# Patient Record
Sex: Male | Born: 1940
Health system: Southern US, Community
[De-identification: ages and names within clinical notes are randomized; demographics above are authoritative.]

## PROBLEM LIST (undated history)

## (undated) DIAGNOSIS — I340 Nonrheumatic mitral (valve) insufficiency: Secondary | ICD-10-CM

## (undated) DIAGNOSIS — I2510 Atherosclerotic heart disease of native coronary artery without angina pectoris: Secondary | ICD-10-CM

## (undated) DIAGNOSIS — K08109 Complete loss of teeth, unspecified cause, unspecified class: Secondary | ICD-10-CM

## (undated) DIAGNOSIS — K219 Gastro-esophageal reflux disease without esophagitis: Secondary | ICD-10-CM

## (undated) DIAGNOSIS — Z8719 Personal history of other diseases of the digestive system: Secondary | ICD-10-CM

## (undated) DIAGNOSIS — Z87898 Personal history of other specified conditions: Secondary | ICD-10-CM

## (undated) DIAGNOSIS — N529 Male erectile dysfunction, unspecified: Secondary | ICD-10-CM

## (undated) DIAGNOSIS — E782 Mixed hyperlipidemia: Secondary | ICD-10-CM

## (undated) DIAGNOSIS — M1990 Unspecified osteoarthritis, unspecified site: Secondary | ICD-10-CM

## (undated) DIAGNOSIS — N179 Acute kidney failure, unspecified: Secondary | ICD-10-CM

## (undated) DIAGNOSIS — C61 Malignant neoplasm of prostate: Secondary | ICD-10-CM

## (undated) DIAGNOSIS — I739 Peripheral vascular disease, unspecified: Secondary | ICD-10-CM

## (undated) DIAGNOSIS — N138 Other obstructive and reflux uropathy: Secondary | ICD-10-CM

## (undated) DIAGNOSIS — I10 Essential (primary) hypertension: Secondary | ICD-10-CM

## (undated) DIAGNOSIS — F329 Major depressive disorder, single episode, unspecified: Secondary | ICD-10-CM

## (undated) DIAGNOSIS — E119 Type 2 diabetes mellitus without complications: Secondary | ICD-10-CM

## (undated) DIAGNOSIS — R0600 Dyspnea, unspecified: Secondary | ICD-10-CM

## (undated) DIAGNOSIS — J449 Chronic obstructive pulmonary disease, unspecified: Secondary | ICD-10-CM

## (undated) DIAGNOSIS — N401 Enlarged prostate with lower urinary tract symptoms: Secondary | ICD-10-CM

## (undated) DIAGNOSIS — R569 Unspecified convulsions: Secondary | ICD-10-CM

## (undated) DIAGNOSIS — Z972 Presence of dental prosthetic device (complete) (partial): Secondary | ICD-10-CM

## (undated) DIAGNOSIS — F32A Depression, unspecified: Secondary | ICD-10-CM

## (undated) DIAGNOSIS — Z973 Presence of spectacles and contact lenses: Secondary | ICD-10-CM

## (undated) DIAGNOSIS — K5730 Diverticulosis of large intestine without perforation or abscess without bleeding: Secondary | ICD-10-CM

## (undated) DIAGNOSIS — Z8619 Personal history of other infectious and parasitic diseases: Secondary | ICD-10-CM

## (undated) HISTORY — DX: Gastro-esophageal reflux disease without esophagitis: K21.9

## (undated) HISTORY — PX: ESOPHAGOGASTRODUODENOSCOPY: SHX1529

## (undated) HISTORY — DX: Major depressive disorder, single episode, unspecified: F32.9

## (undated) HISTORY — PX: FOOT SURGERY: SHX648

## (undated) HISTORY — DX: Depression, unspecified: F32.A

## (undated) HISTORY — DX: Peripheral vascular disease, unspecified: I73.9

## (undated) HISTORY — PX: COLONOSCOPY: SHX174

## (undated) HISTORY — PX: TONSILLECTOMY: SUR1361

## (undated) HISTORY — DX: Nonrheumatic mitral (valve) insufficiency: I34.0

## (undated) SURGERY — MINOR CAPSULOTOMY
Anesthesia: Choice | Laterality: Right

---

## 1998-06-18 ENCOUNTER — Emergency Department (HOSPITAL_COMMUNITY): Admission: EM | Admit: 1998-06-18 | Discharge: 1998-06-18 | Payer: Self-pay | Admitting: Emergency Medicine

## 2000-07-05 ENCOUNTER — Inpatient Hospital Stay (HOSPITAL_COMMUNITY): Admission: EM | Admit: 2000-07-05 | Discharge: 2000-07-06 | Payer: Self-pay | Admitting: *Deleted

## 2000-07-05 ENCOUNTER — Encounter: Payer: Self-pay | Admitting: Emergency Medicine

## 2000-07-05 HISTORY — PX: OTHER SURGICAL HISTORY: SHX169

## 2000-07-28 ENCOUNTER — Encounter: Admission: RE | Admit: 2000-07-28 | Discharge: 2000-09-16 | Payer: Self-pay | Admitting: Orthopedic Surgery

## 2001-02-20 ENCOUNTER — Encounter: Payer: Self-pay | Admitting: Orthopedic Surgery

## 2001-02-20 ENCOUNTER — Ambulatory Visit (HOSPITAL_COMMUNITY): Admission: RE | Admit: 2001-02-20 | Discharge: 2001-02-20 | Payer: Self-pay | Admitting: Orthopedic Surgery

## 2001-05-11 ENCOUNTER — Encounter: Admission: RE | Admit: 2001-05-11 | Discharge: 2001-05-26 | Payer: Self-pay | Admitting: Orthopedic Surgery

## 2003-10-04 ENCOUNTER — Encounter: Payer: Self-pay | Admitting: Emergency Medicine

## 2003-10-04 ENCOUNTER — Emergency Department (HOSPITAL_COMMUNITY): Admission: EM | Admit: 2003-10-04 | Discharge: 2003-10-04 | Payer: Self-pay | Admitting: Emergency Medicine

## 2003-12-01 ENCOUNTER — Emergency Department (HOSPITAL_COMMUNITY): Admission: EM | Admit: 2003-12-01 | Discharge: 2003-12-01 | Payer: Self-pay | Admitting: Emergency Medicine

## 2004-02-16 ENCOUNTER — Emergency Department (HOSPITAL_COMMUNITY): Admission: EM | Admit: 2004-02-16 | Discharge: 2004-02-16 | Payer: Self-pay | Admitting: Emergency Medicine

## 2004-02-22 ENCOUNTER — Emergency Department (HOSPITAL_COMMUNITY): Admission: EM | Admit: 2004-02-22 | Discharge: 2004-02-22 | Payer: Self-pay | Admitting: Emergency Medicine

## 2004-09-16 ENCOUNTER — Emergency Department (HOSPITAL_COMMUNITY): Admission: EM | Admit: 2004-09-16 | Discharge: 2004-09-17 | Payer: Self-pay | Admitting: Emergency Medicine

## 2004-10-15 ENCOUNTER — Emergency Department (HOSPITAL_COMMUNITY): Admission: EM | Admit: 2004-10-15 | Discharge: 2004-10-15 | Payer: Self-pay | Admitting: Emergency Medicine

## 2005-03-05 ENCOUNTER — Emergency Department (HOSPITAL_COMMUNITY): Admission: EM | Admit: 2005-03-05 | Discharge: 2005-03-05 | Payer: Self-pay | Admitting: Emergency Medicine

## 2005-05-30 ENCOUNTER — Emergency Department (HOSPITAL_COMMUNITY): Admission: EM | Admit: 2005-05-30 | Discharge: 2005-05-30 | Payer: Self-pay | Admitting: Emergency Medicine

## 2006-02-11 ENCOUNTER — Emergency Department (HOSPITAL_COMMUNITY): Admission: EM | Admit: 2006-02-11 | Discharge: 2006-02-11 | Payer: Self-pay | Admitting: Emergency Medicine

## 2006-03-16 ENCOUNTER — Encounter (INDEPENDENT_AMBULATORY_CARE_PROVIDER_SITE_OTHER): Payer: Self-pay | Admitting: Internal Medicine

## 2006-03-16 ENCOUNTER — Ambulatory Visit: Payer: Self-pay | Admitting: Internal Medicine

## 2006-03-16 LAB — CONVERTED CEMR LAB: HCT: 42.3 %

## 2006-03-17 ENCOUNTER — Encounter (INDEPENDENT_AMBULATORY_CARE_PROVIDER_SITE_OTHER): Payer: Self-pay | Admitting: Internal Medicine

## 2006-03-31 ENCOUNTER — Ambulatory Visit: Payer: Self-pay | Admitting: Internal Medicine

## 2006-04-05 ENCOUNTER — Ambulatory Visit (HOSPITAL_COMMUNITY): Admission: RE | Admit: 2006-04-05 | Discharge: 2006-04-05 | Payer: Self-pay | Admitting: Internal Medicine

## 2006-04-07 ENCOUNTER — Ambulatory Visit: Payer: Self-pay | Admitting: Internal Medicine

## 2006-05-05 ENCOUNTER — Ambulatory Visit: Payer: Self-pay | Admitting: Internal Medicine

## 2006-05-06 ENCOUNTER — Ambulatory Visit (HOSPITAL_COMMUNITY): Admission: RE | Admit: 2006-05-06 | Discharge: 2006-05-06 | Payer: Self-pay | Admitting: Internal Medicine

## 2006-05-10 ENCOUNTER — Ambulatory Visit: Payer: Self-pay | Admitting: Internal Medicine

## 2006-05-10 DIAGNOSIS — R634 Abnormal weight loss: Secondary | ICD-10-CM

## 2006-10-29 ENCOUNTER — Emergency Department (HOSPITAL_COMMUNITY): Admission: EM | Admit: 2006-10-29 | Discharge: 2006-10-30 | Payer: Self-pay | Admitting: Emergency Medicine

## 2006-11-08 ENCOUNTER — Ambulatory Visit: Payer: Self-pay | Admitting: Internal Medicine

## 2006-12-21 HISTORY — PX: KNEE ARTHROSCOPY: SUR90

## 2007-02-27 ENCOUNTER — Emergency Department (HOSPITAL_COMMUNITY): Admission: EM | Admit: 2007-02-27 | Discharge: 2007-02-27 | Payer: Self-pay | Admitting: Emergency Medicine

## 2007-02-28 ENCOUNTER — Emergency Department (HOSPITAL_COMMUNITY): Admission: EM | Admit: 2007-02-28 | Discharge: 2007-03-01 | Payer: Self-pay | Admitting: Emergency Medicine

## 2007-03-17 ENCOUNTER — Ambulatory Visit (HOSPITAL_COMMUNITY): Admission: RE | Admit: 2007-03-17 | Discharge: 2007-03-17 | Payer: Self-pay | Admitting: Internal Medicine

## 2007-03-17 ENCOUNTER — Ambulatory Visit: Payer: Self-pay | Admitting: Internal Medicine

## 2007-03-31 ENCOUNTER — Ambulatory Visit: Payer: Self-pay | Admitting: Internal Medicine

## 2007-04-14 ENCOUNTER — Encounter: Admission: RE | Admit: 2007-04-14 | Discharge: 2007-06-01 | Payer: Self-pay | Admitting: Internal Medicine

## 2007-05-20 ENCOUNTER — Emergency Department (HOSPITAL_COMMUNITY): Admission: EM | Admit: 2007-05-20 | Discharge: 2007-05-20 | Payer: Self-pay | Admitting: Emergency Medicine

## 2007-05-31 ENCOUNTER — Ambulatory Visit: Payer: Self-pay | Admitting: Internal Medicine

## 2007-05-31 DIAGNOSIS — D500 Iron deficiency anemia secondary to blood loss (chronic): Secondary | ICD-10-CM | POA: Insufficient documentation

## 2007-05-31 DIAGNOSIS — D509 Iron deficiency anemia, unspecified: Secondary | ICD-10-CM | POA: Insufficient documentation

## 2007-05-31 LAB — CONVERTED CEMR LAB
BUN: 25 mg/dL
Creatinine, Ser: 1.13 mg/dL
HCT: 40.4 %
Platelets: 361 10*3/uL

## 2007-06-30 ENCOUNTER — Ambulatory Visit: Payer: Self-pay | Admitting: Internal Medicine

## 2007-07-05 DIAGNOSIS — K921 Melena: Secondary | ICD-10-CM | POA: Insufficient documentation

## 2007-08-15 ENCOUNTER — Telehealth (INDEPENDENT_AMBULATORY_CARE_PROVIDER_SITE_OTHER): Payer: Self-pay | Admitting: Internal Medicine

## 2007-08-17 ENCOUNTER — Encounter (INDEPENDENT_AMBULATORY_CARE_PROVIDER_SITE_OTHER): Payer: Self-pay | Admitting: Internal Medicine

## 2007-08-17 DIAGNOSIS — I10 Essential (primary) hypertension: Secondary | ICD-10-CM | POA: Insufficient documentation

## 2007-08-18 DIAGNOSIS — F528 Other sexual dysfunction not due to a substance or known physiological condition: Secondary | ICD-10-CM

## 2007-08-18 DIAGNOSIS — F1010 Alcohol abuse, uncomplicated: Secondary | ICD-10-CM | POA: Insufficient documentation

## 2007-08-23 ENCOUNTER — Telehealth (INDEPENDENT_AMBULATORY_CARE_PROVIDER_SITE_OTHER): Payer: Self-pay | Admitting: Internal Medicine

## 2007-08-26 ENCOUNTER — Ambulatory Visit: Payer: Self-pay | Admitting: Internal Medicine

## 2007-08-26 DIAGNOSIS — B86 Scabies: Secondary | ICD-10-CM

## 2007-08-31 ENCOUNTER — Encounter (INDEPENDENT_AMBULATORY_CARE_PROVIDER_SITE_OTHER): Payer: Self-pay | Admitting: Internal Medicine

## 2007-08-31 DIAGNOSIS — K5730 Diverticulosis of large intestine without perforation or abscess without bleeding: Secondary | ICD-10-CM | POA: Insufficient documentation

## 2007-08-31 HISTORY — PX: COLONOSCOPY: SHX174

## 2007-09-07 ENCOUNTER — Telehealth (INDEPENDENT_AMBULATORY_CARE_PROVIDER_SITE_OTHER): Payer: Self-pay | Admitting: *Deleted

## 2007-09-13 ENCOUNTER — Encounter (INDEPENDENT_AMBULATORY_CARE_PROVIDER_SITE_OTHER): Payer: Self-pay | Admitting: Internal Medicine

## 2007-10-06 ENCOUNTER — Encounter (INDEPENDENT_AMBULATORY_CARE_PROVIDER_SITE_OTHER): Payer: Self-pay | Admitting: Internal Medicine

## 2007-10-06 ENCOUNTER — Telehealth (INDEPENDENT_AMBULATORY_CARE_PROVIDER_SITE_OTHER): Payer: Self-pay | Admitting: *Deleted

## 2007-10-18 ENCOUNTER — Encounter (INDEPENDENT_AMBULATORY_CARE_PROVIDER_SITE_OTHER): Payer: Self-pay | Admitting: Internal Medicine

## 2007-11-04 ENCOUNTER — Ambulatory Visit: Payer: Self-pay | Admitting: Internal Medicine

## 2007-11-04 DIAGNOSIS — R198 Other specified symptoms and signs involving the digestive system and abdomen: Secondary | ICD-10-CM | POA: Insufficient documentation

## 2007-11-04 HISTORY — DX: Other specified symptoms and signs involving the digestive system and abdomen: R19.8

## 2007-12-12 ENCOUNTER — Encounter (INDEPENDENT_AMBULATORY_CARE_PROVIDER_SITE_OTHER): Payer: Self-pay | Admitting: Internal Medicine

## 2007-12-22 HISTORY — PX: CATARACT EXTRACTION W/ INTRAOCULAR LENS  IMPLANT, BILATERAL: SHX1307

## 2007-12-24 ENCOUNTER — Encounter: Admission: RE | Admit: 2007-12-24 | Discharge: 2007-12-24 | Payer: Self-pay | Admitting: Neurology

## 2008-01-12 ENCOUNTER — Ambulatory Visit: Payer: Self-pay | Admitting: Internal Medicine

## 2008-01-12 DIAGNOSIS — M4802 Spinal stenosis, cervical region: Secondary | ICD-10-CM

## 2008-01-12 DIAGNOSIS — R252 Cramp and spasm: Secondary | ICD-10-CM | POA: Insufficient documentation

## 2008-01-13 ENCOUNTER — Telehealth (INDEPENDENT_AMBULATORY_CARE_PROVIDER_SITE_OTHER): Payer: Self-pay | Admitting: Internal Medicine

## 2008-01-26 ENCOUNTER — Telehealth (INDEPENDENT_AMBULATORY_CARE_PROVIDER_SITE_OTHER): Payer: Self-pay | Admitting: Internal Medicine

## 2008-02-10 ENCOUNTER — Telehealth (INDEPENDENT_AMBULATORY_CARE_PROVIDER_SITE_OTHER): Payer: Self-pay | Admitting: Internal Medicine

## 2008-02-14 ENCOUNTER — Encounter (INDEPENDENT_AMBULATORY_CARE_PROVIDER_SITE_OTHER): Payer: Self-pay | Admitting: Internal Medicine

## 2008-02-25 ENCOUNTER — Telehealth (INDEPENDENT_AMBULATORY_CARE_PROVIDER_SITE_OTHER): Payer: Self-pay | Admitting: Internal Medicine

## 2008-02-27 ENCOUNTER — Encounter (INDEPENDENT_AMBULATORY_CARE_PROVIDER_SITE_OTHER): Payer: Self-pay | Admitting: Internal Medicine

## 2008-03-01 ENCOUNTER — Ambulatory Visit: Payer: Self-pay | Admitting: Internal Medicine

## 2008-03-01 DIAGNOSIS — H269 Unspecified cataract: Secondary | ICD-10-CM | POA: Insufficient documentation

## 2008-03-07 ENCOUNTER — Ambulatory Visit (HOSPITAL_COMMUNITY): Admission: RE | Admit: 2008-03-07 | Discharge: 2008-03-07 | Payer: Self-pay | Admitting: Internal Medicine

## 2008-03-07 ENCOUNTER — Telehealth (INDEPENDENT_AMBULATORY_CARE_PROVIDER_SITE_OTHER): Payer: Self-pay | Admitting: Internal Medicine

## 2008-03-07 ENCOUNTER — Ambulatory Visit: Payer: Self-pay | Admitting: Internal Medicine

## 2008-03-10 ENCOUNTER — Encounter (INDEPENDENT_AMBULATORY_CARE_PROVIDER_SITE_OTHER): Payer: Self-pay | Admitting: Internal Medicine

## 2008-03-10 LAB — CONVERTED CEMR LAB
AST: 8 units/L (ref 0–37)
Alkaline Phosphatase: 62 units/L (ref 39–117)
BUN: 21 mg/dL (ref 6–23)
Basophils Relative: 1 % (ref 0–1)
Calcium: 9.5 mg/dL (ref 8.4–10.5)
Creatinine, Ser: 0.97 mg/dL (ref 0.40–1.50)
Eosinophils Absolute: 0.3 10*3/uL (ref 0.0–0.7)
HCT: 40.7 % (ref 39.0–52.0)
HDL: 41 mg/dL (ref >39)
Hemoglobin: 12.8 g/dL — ABNORMAL LOW (ref 13.0–17.0)
LDL Cholesterol: 174 mg/dL — ABNORMAL HIGH (ref 0–99)
MCHC: 31.4 g/dL (ref 30.0–36.0)
MCV: 85 fL (ref 78.0–100.0)
Monocytes Absolute: 0.6 10*3/uL (ref 0.1–1.0)
Monocytes Relative: 7 % (ref 3–12)
RBC: 4.79 M/uL (ref 4.22–5.81)
Total CHOL/HDL Ratio: 5.9
Triglycerides: 131 mg/dL (ref <150)

## 2008-03-22 ENCOUNTER — Ambulatory Visit (HOSPITAL_COMMUNITY): Admission: RE | Admit: 2008-03-22 | Discharge: 2008-03-22 | Payer: Self-pay | Admitting: Ophthalmology

## 2008-03-26 ENCOUNTER — Telehealth (INDEPENDENT_AMBULATORY_CARE_PROVIDER_SITE_OTHER): Payer: Self-pay | Admitting: Internal Medicine

## 2008-03-29 ENCOUNTER — Ambulatory Visit: Payer: Self-pay | Admitting: Internal Medicine

## 2008-04-20 ENCOUNTER — Telehealth (INDEPENDENT_AMBULATORY_CARE_PROVIDER_SITE_OTHER): Payer: Self-pay | Admitting: Internal Medicine

## 2008-05-10 ENCOUNTER — Ambulatory Visit: Payer: Self-pay | Admitting: Internal Medicine

## 2008-05-11 ENCOUNTER — Telehealth (INDEPENDENT_AMBULATORY_CARE_PROVIDER_SITE_OTHER): Payer: Self-pay | Admitting: Internal Medicine

## 2008-05-11 ENCOUNTER — Encounter (INDEPENDENT_AMBULATORY_CARE_PROVIDER_SITE_OTHER): Payer: Self-pay | Admitting: Internal Medicine

## 2008-05-11 DIAGNOSIS — E7800 Pure hypercholesterolemia, unspecified: Secondary | ICD-10-CM | POA: Insufficient documentation

## 2008-05-11 LAB — CONVERTED CEMR LAB
ALT: 12 units/L (ref 0–53)
Bilirubin, Direct: 0.1 mg/dL (ref 0.0–0.3)
Cholesterol: 156 mg/dL (ref 0–200)
Indirect Bilirubin: 0.3 mg/dL (ref 0.0–0.9)
Total Bilirubin: 0.4 mg/dL (ref 0.3–1.2)
VLDL: 12 mg/dL (ref 0–40)

## 2008-05-31 ENCOUNTER — Encounter (INDEPENDENT_AMBULATORY_CARE_PROVIDER_SITE_OTHER): Payer: Self-pay | Admitting: Internal Medicine

## 2008-05-31 ENCOUNTER — Ambulatory Visit (HOSPITAL_COMMUNITY): Admission: RE | Admit: 2008-05-31 | Discharge: 2008-05-31 | Payer: Self-pay | Admitting: Ophthalmology

## 2008-06-18 ENCOUNTER — Emergency Department (HOSPITAL_COMMUNITY): Admission: EM | Admit: 2008-06-18 | Discharge: 2008-06-18 | Payer: Self-pay | Admitting: Emergency Medicine

## 2008-06-21 ENCOUNTER — Emergency Department (HOSPITAL_COMMUNITY): Admission: EM | Admit: 2008-06-21 | Discharge: 2008-06-21 | Payer: Self-pay | Admitting: Family Medicine

## 2008-07-27 ENCOUNTER — Ambulatory Visit: Payer: Self-pay | Admitting: Internal Medicine

## 2008-07-27 DIAGNOSIS — R42 Dizziness and giddiness: Secondary | ICD-10-CM | POA: Insufficient documentation

## 2008-07-28 ENCOUNTER — Encounter (INDEPENDENT_AMBULATORY_CARE_PROVIDER_SITE_OTHER): Payer: Self-pay | Admitting: Internal Medicine

## 2008-07-28 LAB — CONVERTED CEMR LAB
ALT: 14 units/L (ref 0–53)
Alkaline Phosphatase: 69 units/L (ref 39–117)
Basophils Absolute: 0.1 10*3/uL (ref 0.0–0.1)
Basophils Relative: 1 % (ref 0–1)
Eosinophils Absolute: 0.4 10*3/uL (ref 0.0–0.7)
Eosinophils Relative: 4 % (ref 0–5)
HCT: 36.2 % — ABNORMAL LOW (ref 39.0–52.0)
MCV: 80.8 fL (ref 78.0–100.0)
Platelets: 494 10*3/uL — ABNORMAL HIGH (ref 150–400)
RDW: 17.2 % — ABNORMAL HIGH (ref 11.5–15.5)
Sodium: 141 meq/L (ref 135–145)
Total Bilirubin: 0.3 mg/dL (ref 0.3–1.2)
Total Protein: 7.4 g/dL (ref 6.0–8.3)

## 2008-07-30 ENCOUNTER — Emergency Department (HOSPITAL_COMMUNITY): Admission: EM | Admit: 2008-07-30 | Discharge: 2008-07-31 | Payer: Self-pay | Admitting: Emergency Medicine

## 2008-07-31 ENCOUNTER — Ambulatory Visit (HOSPITAL_COMMUNITY): Admission: RE | Admit: 2008-07-31 | Discharge: 2008-07-31 | Payer: Self-pay | Admitting: Internal Medicine

## 2008-08-01 ENCOUNTER — Encounter (INDEPENDENT_AMBULATORY_CARE_PROVIDER_SITE_OTHER): Payer: Self-pay | Admitting: Internal Medicine

## 2008-08-02 ENCOUNTER — Encounter (INDEPENDENT_AMBULATORY_CARE_PROVIDER_SITE_OTHER): Payer: Self-pay | Admitting: *Deleted

## 2008-08-07 ENCOUNTER — Encounter (INDEPENDENT_AMBULATORY_CARE_PROVIDER_SITE_OTHER): Payer: Self-pay | Admitting: Internal Medicine

## 2008-08-09 ENCOUNTER — Ambulatory Visit: Payer: Self-pay | Admitting: Internal Medicine

## 2008-08-09 LAB — CONVERTED CEMR LAB
Saturation Ratios: 12 % — ABNORMAL LOW (ref 20–55)
Vitamin B-12: 615 pg/mL (ref 211–911)

## 2008-09-10 ENCOUNTER — Ambulatory Visit: Payer: Self-pay | Admitting: Gastroenterology

## 2008-09-11 ENCOUNTER — Encounter: Payer: Self-pay | Admitting: Gastroenterology

## 2008-09-11 ENCOUNTER — Ambulatory Visit: Payer: Self-pay | Admitting: Gastroenterology

## 2008-09-14 ENCOUNTER — Encounter: Payer: Self-pay | Admitting: Gastroenterology

## 2008-09-17 ENCOUNTER — Ambulatory Visit (HOSPITAL_COMMUNITY): Admission: RE | Admit: 2008-09-17 | Discharge: 2008-09-17 | Payer: Self-pay | Admitting: Gastroenterology

## 2008-10-05 ENCOUNTER — Telehealth: Payer: Self-pay | Admitting: Gastroenterology

## 2008-10-10 ENCOUNTER — Ambulatory Visit: Payer: Self-pay | Admitting: Gastroenterology

## 2008-10-16 ENCOUNTER — Encounter (INDEPENDENT_AMBULATORY_CARE_PROVIDER_SITE_OTHER): Payer: Self-pay | Admitting: Internal Medicine

## 2008-11-07 ENCOUNTER — Telehealth (INDEPENDENT_AMBULATORY_CARE_PROVIDER_SITE_OTHER): Payer: Self-pay | Admitting: Internal Medicine

## 2008-12-18 ENCOUNTER — Ambulatory Visit: Payer: Self-pay | Admitting: Internal Medicine

## 2009-01-13 ENCOUNTER — Encounter (INDEPENDENT_AMBULATORY_CARE_PROVIDER_SITE_OTHER): Payer: Self-pay | Admitting: Internal Medicine

## 2009-01-13 LAB — CONVERTED CEMR LAB
Alkaline Phosphatase: 56 units/L (ref 39–117)
Bilirubin, Direct: 0.1 mg/dL (ref 0.0–0.3)
Cholesterol: 187 mg/dL (ref 0–200)
Indirect Bilirubin: 0.3 mg/dL (ref 0.0–0.9)
LDL Cholesterol: 102 mg/dL — ABNORMAL HIGH (ref 0–99)
Total Protein: 7.2 g/dL (ref 6.0–8.3)
Triglycerides: 135 mg/dL (ref <150)

## 2009-01-15 ENCOUNTER — Telehealth (INDEPENDENT_AMBULATORY_CARE_PROVIDER_SITE_OTHER): Payer: Self-pay | Admitting: Internal Medicine

## 2009-01-28 ENCOUNTER — Emergency Department (HOSPITAL_COMMUNITY): Admission: EM | Admit: 2009-01-28 | Discharge: 2009-01-29 | Payer: Self-pay | Admitting: Emergency Medicine

## 2009-03-08 ENCOUNTER — Encounter (INDEPENDENT_AMBULATORY_CARE_PROVIDER_SITE_OTHER): Payer: Self-pay | Admitting: Internal Medicine

## 2009-04-02 ENCOUNTER — Ambulatory Visit: Payer: Self-pay | Admitting: Internal Medicine

## 2009-04-02 DIAGNOSIS — R05 Cough: Secondary | ICD-10-CM | POA: Insufficient documentation

## 2009-04-17 ENCOUNTER — Inpatient Hospital Stay (HOSPITAL_COMMUNITY): Admission: EM | Admit: 2009-04-17 | Discharge: 2009-04-19 | Payer: Self-pay | Admitting: Emergency Medicine

## 2009-04-17 ENCOUNTER — Ambulatory Visit: Payer: Self-pay | Admitting: Family Medicine

## 2009-05-06 ENCOUNTER — Encounter: Admission: RE | Admit: 2009-05-06 | Discharge: 2009-05-06 | Payer: Self-pay | Admitting: Neurosurgery

## 2009-05-15 ENCOUNTER — Encounter (INDEPENDENT_AMBULATORY_CARE_PROVIDER_SITE_OTHER): Payer: Self-pay | Admitting: Internal Medicine

## 2009-05-16 ENCOUNTER — Ambulatory Visit: Payer: Self-pay | Admitting: Internal Medicine

## 2009-05-16 DIAGNOSIS — IMO0002 Reserved for concepts with insufficient information to code with codable children: Secondary | ICD-10-CM

## 2009-05-16 DIAGNOSIS — R1310 Dysphagia, unspecified: Secondary | ICD-10-CM

## 2009-05-16 HISTORY — DX: Dysphagia, unspecified: R13.10

## 2009-05-21 ENCOUNTER — Encounter (INDEPENDENT_AMBULATORY_CARE_PROVIDER_SITE_OTHER): Payer: Self-pay | Admitting: Internal Medicine

## 2009-05-22 ENCOUNTER — Encounter (INDEPENDENT_AMBULATORY_CARE_PROVIDER_SITE_OTHER): Payer: Self-pay | Admitting: Internal Medicine

## 2009-05-22 ENCOUNTER — Telehealth (INDEPENDENT_AMBULATORY_CARE_PROVIDER_SITE_OTHER): Payer: Self-pay | Admitting: Internal Medicine

## 2009-05-23 ENCOUNTER — Encounter: Admission: RE | Admit: 2009-05-23 | Discharge: 2009-05-23 | Payer: Self-pay | Admitting: Neurosurgery

## 2009-05-28 ENCOUNTER — Telehealth (INDEPENDENT_AMBULATORY_CARE_PROVIDER_SITE_OTHER): Payer: Self-pay | Admitting: Internal Medicine

## 2009-06-07 ENCOUNTER — Encounter (INDEPENDENT_AMBULATORY_CARE_PROVIDER_SITE_OTHER): Payer: Self-pay | Admitting: Internal Medicine

## 2009-06-13 ENCOUNTER — Encounter (INDEPENDENT_AMBULATORY_CARE_PROVIDER_SITE_OTHER): Payer: Self-pay | Admitting: Internal Medicine

## 2009-06-13 ENCOUNTER — Ambulatory Visit (HOSPITAL_COMMUNITY): Admission: RE | Admit: 2009-06-13 | Discharge: 2009-06-13 | Payer: Self-pay | Admitting: Internal Medicine

## 2009-06-26 ENCOUNTER — Encounter (INDEPENDENT_AMBULATORY_CARE_PROVIDER_SITE_OTHER): Payer: Self-pay | Admitting: Internal Medicine

## 2009-07-02 ENCOUNTER — Telehealth (INDEPENDENT_AMBULATORY_CARE_PROVIDER_SITE_OTHER): Payer: Self-pay | Admitting: Internal Medicine

## 2009-07-08 ENCOUNTER — Encounter (INDEPENDENT_AMBULATORY_CARE_PROVIDER_SITE_OTHER): Payer: Self-pay | Admitting: Internal Medicine

## 2009-07-09 ENCOUNTER — Ambulatory Visit (HOSPITAL_COMMUNITY): Admission: RE | Admit: 2009-07-09 | Discharge: 2009-07-09 | Payer: Self-pay | Admitting: Internal Medicine

## 2009-07-11 ENCOUNTER — Encounter (INDEPENDENT_AMBULATORY_CARE_PROVIDER_SITE_OTHER): Payer: Self-pay | Admitting: Internal Medicine

## 2009-07-20 ENCOUNTER — Encounter (INDEPENDENT_AMBULATORY_CARE_PROVIDER_SITE_OTHER): Payer: Self-pay | Admitting: Internal Medicine

## 2009-08-02 ENCOUNTER — Ambulatory Visit: Payer: Self-pay | Admitting: Internal Medicine

## 2009-08-03 ENCOUNTER — Encounter: Admission: RE | Admit: 2009-08-03 | Discharge: 2009-08-03 | Payer: Self-pay | Admitting: Neurosurgery

## 2009-08-04 ENCOUNTER — Emergency Department (HOSPITAL_COMMUNITY): Admission: EM | Admit: 2009-08-04 | Discharge: 2009-08-04 | Payer: Self-pay | Admitting: Emergency Medicine

## 2009-08-27 ENCOUNTER — Encounter: Admission: RE | Admit: 2009-08-27 | Discharge: 2009-08-27 | Payer: Self-pay | Admitting: Neurosurgery

## 2009-09-09 ENCOUNTER — Ambulatory Visit (HOSPITAL_COMMUNITY): Admission: RE | Admit: 2009-09-09 | Discharge: 2009-09-09 | Payer: Self-pay | Admitting: Neurosurgery

## 2009-10-02 ENCOUNTER — Encounter: Admission: RE | Admit: 2009-10-02 | Discharge: 2009-10-02 | Payer: Self-pay | Admitting: Neurosurgery

## 2009-10-16 ENCOUNTER — Inpatient Hospital Stay (HOSPITAL_COMMUNITY): Admission: RE | Admit: 2009-10-16 | Discharge: 2009-10-17 | Payer: Self-pay | Admitting: Neurosurgery

## 2009-10-16 HISTORY — PX: ANTERIOR CERVICAL DECOMP/DISCECTOMY FUSION: SHX1161

## 2009-11-07 ENCOUNTER — Ambulatory Visit: Payer: Self-pay | Admitting: Internal Medicine

## 2009-11-11 ENCOUNTER — Encounter: Admission: RE | Admit: 2009-11-11 | Discharge: 2009-11-11 | Payer: Self-pay | Admitting: Neurosurgery

## 2009-12-24 ENCOUNTER — Encounter: Admission: RE | Admit: 2009-12-24 | Discharge: 2009-12-24 | Payer: Self-pay | Admitting: Neurosurgery

## 2010-01-14 ENCOUNTER — Ambulatory Visit: Payer: Self-pay | Admitting: Internal Medicine

## 2010-01-14 DIAGNOSIS — E1165 Type 2 diabetes mellitus with hyperglycemia: Secondary | ICD-10-CM | POA: Insufficient documentation

## 2010-01-14 DIAGNOSIS — E119 Type 2 diabetes mellitus without complications: Secondary | ICD-10-CM

## 2010-01-20 LAB — CONVERTED CEMR LAB
Albumin: 4.8 g/dL (ref 3.5–5.2)
BUN: 20 mg/dL (ref 6–23)
CO2: 29 meq/L (ref 19–32)
Calcium: 9.9 mg/dL (ref 8.4–10.5)
Chloride: 100 meq/L (ref 96–112)
Cholesterol: 165 mg/dL (ref 0–200)
Creatinine, Ser: 1.16 mg/dL (ref 0.40–1.50)
Eosinophils Absolute: 0.1 10*3/uL (ref 0.0–0.7)
Eosinophils Relative: 2 % (ref 0–5)
Glucose, Bld: 93 mg/dL (ref 70–99)
HCT: 35.8 % — ABNORMAL LOW (ref 39.0–52.0)
HDL: 41 mg/dL (ref >39)
Hemoglobin: 10.9 g/dL — ABNORMAL LOW (ref 13.0–17.0)
Lymphs Abs: 3.2 10*3/uL (ref 0.7–4.0)
MCV: 78.3 fL (ref 78.0–100.0)
Monocytes Absolute: 0.5 10*3/uL (ref 0.1–1.0)
Monocytes Relative: 8 % (ref 3–12)
Platelets: 351 10*3/uL (ref 150–400)
Potassium: 4.8 meq/L (ref 3.5–5.3)
Total CHOL/HDL Ratio: 4
Triglycerides: 124 mg/dL (ref <150)
WBC: 7 10*3/uL (ref 4.0–10.5)

## 2010-01-21 ENCOUNTER — Encounter (INDEPENDENT_AMBULATORY_CARE_PROVIDER_SITE_OTHER): Payer: Self-pay | Admitting: Internal Medicine

## 2010-02-06 ENCOUNTER — Encounter (INDEPENDENT_AMBULATORY_CARE_PROVIDER_SITE_OTHER): Payer: Self-pay | Admitting: Internal Medicine

## 2010-02-18 ENCOUNTER — Encounter (INDEPENDENT_AMBULATORY_CARE_PROVIDER_SITE_OTHER): Payer: Self-pay | Admitting: Internal Medicine

## 2010-02-19 ENCOUNTER — Inpatient Hospital Stay (HOSPITAL_COMMUNITY): Admission: EM | Admit: 2010-02-19 | Discharge: 2010-02-22 | Payer: Self-pay | Admitting: Emergency Medicine

## 2010-02-19 ENCOUNTER — Ambulatory Visit: Payer: Self-pay | Admitting: Cardiovascular Disease

## 2010-02-19 DIAGNOSIS — J189 Pneumonia, unspecified organism: Secondary | ICD-10-CM | POA: Insufficient documentation

## 2010-02-21 ENCOUNTER — Ambulatory Visit: Payer: Self-pay | Admitting: Vascular Surgery

## 2010-02-21 ENCOUNTER — Encounter (INDEPENDENT_AMBULATORY_CARE_PROVIDER_SITE_OTHER): Payer: Self-pay | Admitting: Internal Medicine

## 2010-02-21 HISTORY — PX: TRANSTHORACIC ECHOCARDIOGRAM: SHX275

## 2010-02-27 ENCOUNTER — Ambulatory Visit: Payer: Self-pay | Admitting: Internal Medicine

## 2010-03-04 ENCOUNTER — Ambulatory Visit: Payer: Self-pay | Admitting: Internal Medicine

## 2010-03-06 ENCOUNTER — Encounter (INDEPENDENT_AMBULATORY_CARE_PROVIDER_SITE_OTHER): Payer: Self-pay | Admitting: Internal Medicine

## 2010-04-03 ENCOUNTER — Ambulatory Visit: Payer: Self-pay | Admitting: Internal Medicine

## 2010-04-03 DIAGNOSIS — F17200 Nicotine dependence, unspecified, uncomplicated: Secondary | ICD-10-CM | POA: Insufficient documentation

## 2010-04-03 DIAGNOSIS — J449 Chronic obstructive pulmonary disease, unspecified: Secondary | ICD-10-CM

## 2010-04-03 DIAGNOSIS — K219 Gastro-esophageal reflux disease without esophagitis: Secondary | ICD-10-CM | POA: Insufficient documentation

## 2010-04-03 DIAGNOSIS — J4489 Other specified chronic obstructive pulmonary disease: Secondary | ICD-10-CM | POA: Insufficient documentation

## 2010-04-03 DIAGNOSIS — R1319 Other dysphagia: Secondary | ICD-10-CM

## 2010-04-29 ENCOUNTER — Ambulatory Visit: Payer: Self-pay | Admitting: Internal Medicine

## 2010-04-29 LAB — CONVERTED CEMR LAB
ALT: 17 units/L (ref 0–53)
AST: 14 units/L (ref 0–37)
Albumin: 4.6 g/dL (ref 3.5–5.2)
Basophils Relative: 1 % (ref 0–1)
Bilirubin, Direct: 0.1 mg/dL (ref 0.0–0.3)
Cholesterol: 142 mg/dL (ref 0–200)
Eosinophils Absolute: 0.2 10*3/uL (ref 0.0–0.7)
HDL: 50 mg/dL (ref >39)
Hemoglobin: 11.2 g/dL — ABNORMAL LOW (ref 13.0–17.0)
Lymphs Abs: 2 10*3/uL (ref 0.7–4.0)
MCHC: 30.8 g/dL (ref 30.0–36.0)
MCV: 80.7 fL (ref 78.0–100.0)
Monocytes Absolute: 0.6 10*3/uL (ref 0.1–1.0)
Monocytes Relative: 8 % (ref 3–12)
Neutrophils Relative %: 59 % (ref 43–77)
RBC: 4.51 M/uL (ref 4.22–5.81)
Total Bilirubin: 0.3 mg/dL (ref 0.3–1.2)
Total CHOL/HDL Ratio: 2.8
VLDL: 18 mg/dL (ref 0–40)
WBC: 7 10*3/uL (ref 4.0–10.5)

## 2010-05-06 ENCOUNTER — Ambulatory Visit: Payer: Self-pay | Admitting: Internal Medicine

## 2010-05-07 ENCOUNTER — Telehealth (INDEPENDENT_AMBULATORY_CARE_PROVIDER_SITE_OTHER): Payer: Self-pay | Admitting: *Deleted

## 2010-05-08 ENCOUNTER — Encounter: Payer: Self-pay | Admitting: Internal Medicine

## 2010-05-15 ENCOUNTER — Telehealth (INDEPENDENT_AMBULATORY_CARE_PROVIDER_SITE_OTHER): Payer: Self-pay | Admitting: Internal Medicine

## 2010-05-15 ENCOUNTER — Ambulatory Visit: Payer: Self-pay | Admitting: Internal Medicine

## 2010-05-15 ENCOUNTER — Encounter: Payer: Self-pay | Admitting: Cardiovascular Disease

## 2010-05-15 DIAGNOSIS — R0989 Other specified symptoms and signs involving the circulatory and respiratory systems: Secondary | ICD-10-CM

## 2010-05-15 DIAGNOSIS — R0609 Other forms of dyspnea: Secondary | ICD-10-CM | POA: Insufficient documentation

## 2010-05-22 ENCOUNTER — Encounter: Payer: Self-pay | Admitting: Internal Medicine

## 2010-05-29 ENCOUNTER — Encounter: Payer: Self-pay | Admitting: Internal Medicine

## 2010-06-03 ENCOUNTER — Ambulatory Visit: Payer: Self-pay | Admitting: Cardiovascular Disease

## 2010-06-03 DIAGNOSIS — M79609 Pain in unspecified limb: Secondary | ICD-10-CM

## 2010-06-03 HISTORY — DX: Pain in unspecified limb: M79.609

## 2010-06-04 ENCOUNTER — Emergency Department (HOSPITAL_COMMUNITY): Admission: EM | Admit: 2010-06-04 | Discharge: 2010-06-04 | Payer: Self-pay | Admitting: Emergency Medicine

## 2010-06-13 ENCOUNTER — Ambulatory Visit
Admission: RE | Admit: 2010-06-13 | Discharge: 2010-06-13 | Payer: Self-pay | Source: Home / Self Care | Admitting: Otolaryngology

## 2010-06-13 ENCOUNTER — Emergency Department (HOSPITAL_COMMUNITY): Admission: EM | Admit: 2010-06-13 | Discharge: 2010-06-13 | Payer: Self-pay | Admitting: Emergency Medicine

## 2010-06-13 DIAGNOSIS — N401 Enlarged prostate with lower urinary tract symptoms: Secondary | ICD-10-CM

## 2010-06-13 HISTORY — PX: LARYNGOSCOPY AND ESOPHAGOSCOPY: SHX5660

## 2010-06-18 ENCOUNTER — Emergency Department (HOSPITAL_COMMUNITY): Admission: EM | Admit: 2010-06-18 | Discharge: 2010-06-19 | Payer: Self-pay | Admitting: Emergency Medicine

## 2010-06-20 ENCOUNTER — Encounter (INDEPENDENT_AMBULATORY_CARE_PROVIDER_SITE_OTHER): Payer: Self-pay | Admitting: Internal Medicine

## 2010-06-25 ENCOUNTER — Telehealth: Payer: Self-pay | Admitting: Internal Medicine

## 2010-07-01 ENCOUNTER — Emergency Department (HOSPITAL_COMMUNITY): Admission: EM | Admit: 2010-07-01 | Discharge: 2010-07-01 | Payer: Self-pay | Admitting: Emergency Medicine

## 2010-07-03 ENCOUNTER — Ambulatory Visit: Payer: Self-pay | Admitting: Internal Medicine

## 2010-07-03 DIAGNOSIS — E860 Dehydration: Secondary | ICD-10-CM | POA: Insufficient documentation

## 2010-07-03 DIAGNOSIS — R112 Nausea with vomiting, unspecified: Secondary | ICD-10-CM | POA: Insufficient documentation

## 2010-07-04 ENCOUNTER — Inpatient Hospital Stay (HOSPITAL_COMMUNITY): Admission: EM | Admit: 2010-07-04 | Discharge: 2010-07-05 | Payer: Self-pay | Admitting: Emergency Medicine

## 2010-07-15 ENCOUNTER — Ambulatory Visit: Payer: Self-pay | Admitting: Internal Medicine

## 2010-07-17 ENCOUNTER — Telehealth: Payer: Self-pay | Admitting: Internal Medicine

## 2010-07-18 ENCOUNTER — Telehealth (INDEPENDENT_AMBULATORY_CARE_PROVIDER_SITE_OTHER): Payer: Self-pay | Admitting: Internal Medicine

## 2010-08-28 ENCOUNTER — Encounter: Payer: Self-pay | Admitting: Internal Medicine

## 2010-09-09 ENCOUNTER — Ambulatory Visit: Payer: Self-pay | Admitting: Internal Medicine

## 2010-09-10 ENCOUNTER — Encounter (INDEPENDENT_AMBULATORY_CARE_PROVIDER_SITE_OTHER): Payer: Self-pay | Admitting: Internal Medicine

## 2010-09-10 LAB — CONVERTED CEMR LAB
AST: 11 units/L (ref 0–37)
Alkaline Phosphatase: 65 units/L (ref 39–117)
Bilirubin, Direct: 0.1 mg/dL (ref 0.0–0.3)
Indirect Bilirubin: 0.3 mg/dL (ref 0.0–0.9)
LDL Cholesterol: 79 mg/dL (ref 0–99)
Total Bilirubin: 0.4 mg/dL (ref 0.3–1.2)

## 2010-10-17 ENCOUNTER — Encounter (INDEPENDENT_AMBULATORY_CARE_PROVIDER_SITE_OTHER): Payer: Self-pay | Admitting: Internal Medicine

## 2010-10-31 ENCOUNTER — Encounter (INDEPENDENT_AMBULATORY_CARE_PROVIDER_SITE_OTHER): Payer: Self-pay | Admitting: Internal Medicine

## 2010-11-06 ENCOUNTER — Encounter (INDEPENDENT_AMBULATORY_CARE_PROVIDER_SITE_OTHER): Payer: Self-pay | Admitting: Internal Medicine

## 2010-11-19 ENCOUNTER — Telehealth (INDEPENDENT_AMBULATORY_CARE_PROVIDER_SITE_OTHER): Payer: Self-pay | Admitting: Internal Medicine

## 2010-11-23 ENCOUNTER — Inpatient Hospital Stay (HOSPITAL_COMMUNITY)
Admission: EM | Admit: 2010-11-23 | Discharge: 2010-11-26 | Payer: Self-pay | Source: Home / Self Care | Attending: Internal Medicine | Admitting: Internal Medicine

## 2010-12-22 ENCOUNTER — Encounter (INDEPENDENT_AMBULATORY_CARE_PROVIDER_SITE_OTHER): Payer: Self-pay | Admitting: Internal Medicine

## 2011-01-02 ENCOUNTER — Telehealth (INDEPENDENT_AMBULATORY_CARE_PROVIDER_SITE_OTHER): Payer: Self-pay | Admitting: Internal Medicine

## 2011-01-06 ENCOUNTER — Ambulatory Visit
Admission: RE | Admit: 2011-01-06 | Discharge: 2011-01-06 | Payer: Self-pay | Source: Home / Self Care | Attending: Internal Medicine | Admitting: Internal Medicine

## 2011-01-06 ENCOUNTER — Encounter (INDEPENDENT_AMBULATORY_CARE_PROVIDER_SITE_OTHER): Payer: Self-pay | Admitting: Internal Medicine

## 2011-01-06 DIAGNOSIS — G40309 Generalized idiopathic epilepsy and epileptic syndromes, not intractable, without status epilepticus: Secondary | ICD-10-CM | POA: Insufficient documentation

## 2011-01-08 ENCOUNTER — Ambulatory Visit (HOSPITAL_COMMUNITY)
Admission: RE | Admit: 2011-01-08 | Discharge: 2011-01-08 | Payer: Self-pay | Source: Home / Self Care | Attending: Ophthalmology | Admitting: Ophthalmology

## 2011-01-08 HISTORY — PX: OTHER SURGICAL HISTORY: SHX169

## 2011-01-12 ENCOUNTER — Encounter: Payer: Self-pay | Admitting: Internal Medicine

## 2011-01-18 LAB — CONVERTED CEMR LAB
Blood Glucose, Fingerstick: 94
Hgb A1c MFr Bld: 5.6 %

## 2011-01-22 NOTE — Miscellaneous (Signed)
Summary: dulera changed to symbicort per MR  Clinical Lists Changes  Medications: Removed medication of DULERA 100-5 MCG/ACT AERO (MOMETASONE FURO-FORMOTEROL FUM) 2 puffs twice per day Added new medication of SYMBICORT 80-4.5 MCG/ACT AERO (BUDESONIDE-FORMOTEROL FUMARATE) 2 puffs twice daily - Signed Rx of SYMBICORT 80-4.5 MCG/ACT AERO (BUDESONIDE-FORMOTEROL FUMARATE) 2 puffs twice daily;  #1 x 3;  Signed;  Entered by: Carron Curie CMA;  Authorized by: Kalman Shan MD;  Method used: Electronically to [facility] [address] [facility] [place], [place], [place]  [address], Ph: [phone], Fax: [phone]    Prescriptions: SYMBICORT 80-4.5 MCG/ACT AERO (BUDESONIDE-FORMOTEROL FUMARATE) 2 puffs twice daily  #1 x 3   Entered by:   Carron Curie CMA   Authorized by:   Kalman Shan MD   Signed by:   Carron Curie CMA on 05/22/2010   Method used:   Electronically to        [facility] [address] (retail)       [address]       [place], [place]  [address]       Ph: [phone]       Fax: [phone]   RxID:   [id number]

## 2011-01-22 NOTE — Assessment & Plan Note (Signed)
Summary: SEIZURES/LR   Vital Signs:  Patient profile:   70 year old male Weight:      163.13 pounds O2 Sat:      97 % on Room air Temp:     97.9 degrees F oral Pulse rate:   64 / minute Pulse rhythm:   irregular Resp:     20 per minute BP sitting:   140 / 84  (left arm) Cuff size:   regular  Vitals Entered By: Hale Drone CMA (January 06, 2011 11:54 AM)  O2 Flow:  Room air  Serial Vital Signs/Assessments:                                PEF    PreRx  PostRx Time      O2 Sat  O2 Type     L/min  L/min  L/min   By 12:01 PM  97  %   Room air                          Hale Drone CMA  Comments: 12:01 PM PF: 470 - 470 - 500 By: Hale Drone CMA   CC: Hosp. f/u on seizures. Having pain on both arms and left toe. Left toe feels numb. Taking Oxcodone for pain on left arm. Needs refill on Oxycodone and Nexium. Dr. Vear Clock is to refill his Oxycodone.  Is Patient Diabetic? Yes Pain Assessment Patient in pain? yes     Location: arms Intensity: 9 Type: aching Onset of pain  Constant CBG Result 95 CBG Device ID A non fasting  Does patient need assistance? Functional Status Self care   Primary Care Provider:  Julieanne Manson MD - PMD, Dr. Anne Hahn - Neurology  CC:  [facility]. f/u on seizures. Having pain on both arms and left toe. Left toe feels numb. Taking Oxcodone for pain on left arm. Needs refill on Oxycodone and Nexium. Dr. Vear Clock is to refill his Oxycodone. Marland Kitchen  History of Present Illness: 1. Pt. with chronic neurogenic pain--cervical and lumbar, for which he was taking Carbamazepine.  Seizures with toxic level Carbamazepine--hospitalized 12/4-12/7/11 for this.  No seizure activity since and has not felt this way since.  Pt. states the night of seizure onset, he was bumping into this and having difficulty keeping his balance.  Pt. denies purposely or even accidentally taking extra medication.  States he has seen his new neurologist, Dr. Vear Clock, since hospitalization.  No  indication as to why his level was so high per pt.  Sees Dr. Vear Clock again tomorrow--currently evaluating left leg radicular symptoms.  2.  GERD:  out of Nexium--was filled by our office yesterday, but pt. states was told not filled yet.    3.  DM:  Getting 130-140 range with sugars.  Did get flu vaccine today.  Had eye exam with Dr. Mitzi Davenport this morning.  Sounds like needs a procedure done on the left, but pt. not clear if any new diabetic changes.  Has had laser surgery in both eyes. Last A1C in May was in mid 5% range.  4.  Hypertension:  has not taken any meds today yet.  Had normal BMET in the hospital recently.  Do not see an A1C  Current Medications (verified): 1)  Nexium 40 Mg  Pack (Esomeprazole Magnesium) .... Take 1 Tablet By Mouth Two Times A Day 2)  Triamcinolone Acetonide 0.1 %  Crea (Triamcinolone  Acetonide) .... Apply Two Times A Day To Affected Area As Needed 3)  Pravastatin Sodium 80 Mg Tabs (Pravastatin Sodium) .Marland Kitchen.. 1 Tab By Mouth Daily With Meal 4)  Lumigan 0.03 % Soln (Bimatoprost) .Marland Kitchen.. 1 Drop Ou At Bedtime.  Dr. Mitzi Davenport. 5)  Losartan Potassium 100 Mg Tabs (Losartan Potassium) .Marland Kitchen.. 1 Tab By Mouth Daily 6)  Oxybutynin Chloride 10 Mg Xr24h-Tab (Oxybutynin Chloride) .Marland Kitchen.. 1 Tab By Mouth At Bedtime--Dr. Anne Hahn 7)  Cymbalta 60 Mg Cpep (Duloxetine Hcl) .Marland Kitchen.. 1 Cap By Mouth Daily--Dr. Wynetta Emery 8)  Ferrous Sulfate 325 (65 Fe) Mg Tabs (Ferrous Sulfate) .Marland Kitchen.. 1 Tab By Mouth Two Times A Day With 500 Mg of Vitamin C Daily 9)  Vitamin C 500 Mg Chew (Ascorbic Acid) .Marland Kitchen.. 1 Chewtab By Mouth Two Times A Day With Iron Supplement 10)  Carbamazepine 200 Mg Tabs (Carbamazepine) .Marland Kitchen.. 1 Tab By Mouth Two Times A Day --Dr. Thana Farr Spine Radiculopathy 11)  Spiriva Handihaler 18 Mcg Caps (Tiotropium Bromide Monohydrate) .... Once Daily 12)  Symbicort 80-4.5 Mcg/act Aero (Budesonide-Formoterol Fumarate) .... 2 Puffs Twice Daily 13)  Symbicort 80-4.5 Mcg/act Aero (Budesonide-Formoterol Fumarate) ....  2 Puffs Two Times A Day 14)  Methadone Hcl 5 Mg Tabs (Methadone Hcl) .Marland Kitchen.. 1 Tab Po Every 6 Hours As Needed:pain Clinic--Dr. Pearletha Forge 15)  Losartan Potassium 100 Mg Tabs (Losartan Potassium) .... Take One Tablet By Mouth Once Daily. 16)  Tamsulosin Hcl 0.4 Mg Caps (Tamsulosin Hcl) .Marland Kitchen.. 1 Cap By Mouth Craig Staggers, Anp--[facility] 17)  Oxycodone-Acetaminophen 5-325 Mg Tabs (Oxycodone-Acetaminophen) .Marland Kitchen.. 1 Tab By Mouth Every 6 Hours As Needed For Pain. 18)  Amitriptyline Hcl 10 Mg Tabs (Amitriptyline Hcl) .Marland Kitchen.. 1 Tab Every Night At Bed Time  Allergies (verified): No Known Drug Allergies  Physical Exam  General:  NAD Lungs:  Normal respiratory effort, chest expands symmetrically. Lungs are clear to auscultation, no crackles or wheezes. Heart:  Normal rate and regular rhythm. S1 and S2 normal without gallop, murmur, click, rub or other extra sounds.  Radial pulses normal and equal Neurologic:  alert & oriented X3, cranial nerves II-XII intact, and gait normal.     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 Pt. was taking for control of chronic neurogenic pain. The following medications were removed from the medication list:    Carbamazepine 200 Mg Tabs (Carbamazepine) .Marland Kitchen... 1 tab by mouth two times a day --dr. Thana Farr spine radiculopathy  Problem # 2:  G E R D (ICD-530.81) Nexium refilled His updated medication list for this problem includes:    Nexium 40 Mg Pack (Esomeprazole magnesium) .Marland Kitchen... Take 1 tablet by mouth two times a day  Problem # 3:  DIABETES MELLITUS, TYPE II, CONTROLLED (ICD-250.00) Has been controlled The following medications were removed from the medication list:    Losartan Potassium 100 Mg Tabs (Losartan potassium) .Marland Kitchen... Take one tablet by mouth once daily. His updated medication list for this problem includes:    Losartan Potassium 100 Mg Tabs (Losartan potassium) .Marland Kitchen... 1  tab by mouth daily  Orders: Capillary Blood Glucose/CBG (82948) T- Hemoglobin A1C (78469-62952) T-Urine Microalbumin w/creat. ratio ([phone])  Problem # 4:  HYPERTENSION (ICD-401.9) BP up a bit today, but did not take Losartan this morning. The following medications were removed from the medication list:    Losartan Potassium 100 Mg Tabs (Losartan potassium) .Marland Kitchen... Take one tablet by mouth once daily. His updated medication list for this problem includes:  Losartan Potassium 100 Mg Tabs (Losartan potassium) .Marland Kitchen... 1 tab by mouth daily  Complete Medication List: 1)  Nexium 40 Mg Pack (Esomeprazole magnesium) .... Take 1 tablet by mouth two times a day 2)  Triamcinolone Acetonide 0.1 % Crea (Triamcinolone acetonide) .... Apply two times a day to affected area as needed 3)  Pravastatin Sodium 80 Mg Tabs (Pravastatin sodium) .Marland Kitchen.. 1 tab by mouth daily with meal 4)  Lumigan 0.03 % Soln (Bimatoprost) .Marland Kitchen.. 1 drop ou at bedtime.  dr. Mitzi Davenport. 5)  Losartan Potassium 100 Mg Tabs (Losartan potassium) .Marland Kitchen.. 1 tab by mouth daily 6)  Oxybutynin Chloride 10 Mg Xr24h-tab (Oxybutynin chloride) .Marland Kitchen.. 1 tab by mouth at bedtime--dr. willis 7)  Cymbalta 60 Mg Cpep (Duloxetine hcl) .Marland Kitchen.. 1 cap by mouth daily--dr. cram 8)  Ferrous Sulfate 325 (65 Fe) Mg Tabs (Ferrous sulfate) .Marland Kitchen.. 1 tab by mouth two times a day with 500 mg of vitamin c daily 9)  Vitamin C 500 Mg Chew (Ascorbic acid) .Marland Kitchen.. 1 chewtab by mouth two times a day with iron supplement 10)  Spiriva Handihaler 18 Mcg Caps (Tiotropium bromide monohydrate) .... Once daily 11)  Symbicort 80-4.5 Mcg/act Aero (Budesonide-formoterol fumarate) .... 2 puffs twice daily 12)  Symbicort 80-4.5 Mcg/act Aero (Budesonide-formoterol fumarate) .... 2 puffs two times a day 13)  Methadone Hcl 5 Mg Tabs (Methadone hcl) .Marland Kitchen.. 1 tab po every 6 hours as needed:pain clinic--dr. mark phillips--for 14)  Tamsulosin Hcl 0.4 Mg Caps (Tamsulosin hcl) .Marland Kitchen.. 1 cap by mouth  daily--diane warden, anp--[facility] 15)  Oxycodone-acetaminophen 5-325 Mg Tabs (Oxycodone-acetaminophen) .Marland Kitchen.. 1 tab by mouth every 6 hours as needed for pain. 16)  Amitriptyline Hcl 10 Mg Tabs (Amitriptyline hcl) .Marland Kitchen.. 1 tab every night at bed time--dr. phillips  Other Orders: Flu Vaccine 20yrs + (60454) Admin 1st Vaccine (09811)  Patient Instructions: 1)  Follow up with Dr. Delrae Alfred in 4 months --hypertension, DM Prescriptions: NEXIUM 40 MG  PACK (ESOMEPRAZOLE MAGNESIUM) Take 1 tablet by mouth two times a day  #60 Capsule x 11   Entered and Authorized by:   Julieanne Manson MD   Signed by:   Julieanne Manson MD on 01/06/2011   Method used:   Electronically to        [facility] [address] (retail)       [address] [place], [place]  [address]       Ph: [phone]       Fax: [phone]   RxID:   [id number]    Orders Added: 1)  Capillary Blood Glucose/CBG [82948] 2)  Flu Vaccine 3yrs + [90658] 3)  Admin 1st Vaccine [90471] 4)  T- Hemoglobin A1C [83036-23375] 5)  T-Urine Microalbumin w/creat. ratio [82043-[address]] 6)  Est. Patient Level IV [66440]   Immunizations Administered:  Influenza Vaccine # 1:    Vaccine Type: Fluvax 3+    Site: left deltoid    Mfr: GlaxoSmithKline    Dose: 0.5 ml    Route: IM    Given by: Hale Drone CMA    Exp. Date: 06/20/2011    Lot #: [id number]    VIS given: 07/15/10 version given January 06, 2011.  Flu Vaccine Consent Questions:    Do you have a history of severe allergic reactions to this vaccine? no    Any prior history of allergic reactions to egg and/or gelatin? no    Do you have a sensitivity  to the preservative Thimersol? no    Do you have a past history of Guillan-Barre Syndrome? no    Do you currently have an acute febrile illness? no    Have you ever had a severe reaction to latex? no    Vaccine information given and explained to patient? yes   Immunizations  Administered:  Influenza Vaccine # 1:    Vaccine Type: Fluvax 3+    Site: left deltoid    Mfr: GlaxoSmithKline    Dose: 0.5 ml    Route: IM    Given by: Hale Drone CMA    Exp. Date: 06/20/2011    Lot #: [id number]    VIS given: 07/15/10 version given January 06, 2011.

## 2011-01-22 NOTE — Consult Note (Signed)
Summary: [facility] - Consultation Report  [facility] - Consultation Report   Imported By: Earl Many 06/02/2010 11:50:47  _____________________________________________________________________  External Attachment:    Type:   Image     Comment:   External Document

## 2011-01-22 NOTE — Progress Notes (Signed)
Summary: Cardiology referral  Phone Note Outgoing Call   Summary of Call: Debra--see Cardiology referral, EKG, note.  Thanks He may tell you that he had a cardiology evaluation in the hospital already, but I am unable to find it. Initial call taken by: Julieanne Manson MD,  May 15, 2010 10:04 AM        Past History:  Past Surgical History: 1.  Knee Arthroscopy 2.  Eye surgery 3.  10/16/09:  Intracervical discectomy with fusion  C4-5 and C5-6--Dr. Wynetta Emery

## 2011-01-22 NOTE — Letter (Signed)
Summary: [facility] - Referral Form  [facility] - Referral Form   Imported By: Earl Many 06/02/2010 11:48:24  _____________________________________________________________________  External Attachment:    Type:   Image     Comment:   External Document

## 2011-01-22 NOTE — Progress Notes (Signed)
Summary: nos appt  Phone Note Call from Patient   Caller: [email or web address]  Call For: Russell [name] Summary of Call: Rsc nos from 7/5 to 7/27 @ 3:10. Initial call taken by: Darletta Moll,  June 25, 2010 9:13 AM

## 2011-01-22 NOTE — Assessment & Plan Note (Signed)
Summary: f/u and needs labs /tmm   Vital Signs:  Patient profile:   70 year old male Weight:      160.5 pounds Temp:     98.0 degrees F Pulse rate:   68 / minute Pulse rhythm:   regular Resp:     16 per minute BP sitting:   122 / 78  (left arm) Cuff size:   regular  Vitals Entered By: Vesta Mixer CMA (January 14, 2010 2:04 PM) CC: f/u and needs lab work, also feels like food gets stuck in throat sometimes. Is Patient Diabetic? No Pain Assessment Patient in pain? yes     Location: arms  Intensity: 10  Does patient need assistance? Ambulation Normal   Primary Care Provider:  Julieanne Manson MD  CC:  f/u and needs lab work and also feels like food gets stuck in throat sometimes..  History of Present Illness: 1.  Tobacco  Abuse:  pt. stopped smoking in first month of Chantix.  Did not need to refill. Wife has not quit.    2.  Feels like has phlegm in throat--has to clear frequently.  States since had Neurosurgery of Cspine--Dr. Wynetta Emery in November, has noted this more.  States sometimes food seems to get caught in same area of throat.  No heartburn.  Mucous is more pronounced in morning on awakening.  Mucous is white to clear and thick when coughs up.  No sneezing or nasal drainage,  No definitie posterior pharyngeal drainage.  Mouth is dry as well.  3.  Hyperglycemia:  A1C earlier in year was 6.2% with normal fasting sugar.  Pt. nonfasting today.  Has not had a cholesterol check in some time.  last time he ate was about 6 1/2 hours ago.    Allergies (verified): No Known Drug Allergies  Past History:  Past Surgical History: 1.  Knee Arthroscopy 2.  Eye surgery 3.  10/2009:  Cspine surgery--Dr. Wynetta Emery   Impression & Recommendations:  Problem # 1:  DIABETES MELLITUS, TYPE II, CONTROLLED (ICD-250.00) Borderline dx back in August--fasting glucose was actually normal His updated medication list for this problem includes:    Cozaar 50 Mg Tabs (Losartan potassium) .Marland Kitchen...  1 tab by mouth daily  Orders: T-Lipid Profile (60454-09811) T-Urine Microalbumin w/creat. 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 1 tab by mouth daily  Orders: T-Lipid Profile (84132-44010)  Problem # 3:  DYSPHAGIA UNSPECIFIED (ICD-787.20) This has been evaluated extensively in past 2 years without concerning findings:  Barium swallow, EGD, gastric emptying study  Problem # 4:  TOBACCO ABUSE (ICD-305.1) Resolved issue The following medications were removed from the medication list:    Chantix Starting Month Pak 0.5 Mg X 11 & 1 Mg X 42 Tabs (Varenicline tartrate) ..... Start 0.5 mg by mouth daily for 3 days, then 0.5 mg two times a day for 4 days, then 1 mg by mouth two times a day    Chantix 1 Mg Tabs (Varenicline tartrate) .Marland Kitchen... 1 tab by mouth two times a day --start when complete starter pack  Complete Medication List: 1)  Nexium 40 Mg Pack (Esomeprazole magnesium) .... Take 1 tablet by mouth two times a day 2)  Hydrochlorothiazide 25 Mg Tabs (Hydrochlorothiazide) .Marland Kitchen.. 1 tab by mouth daily 3)  Triamcinolone Acetonide 0.1 % Crea (Triamcinolone acetonide) .... Apply two  times a day to affected area as needed 4)  Clobetasol Propionate 0.05 % Crea (Clobetasol propionate) .... Apply two times a day to affected area prn 5)  Vicodin 5-500 Mg Tabs (Hydrocodone-acetaminophen) .Marland Kitchen.. 1 tablet two times a day--dr. cram 6)  Simvastatin 40 Mg Tabs (Simvastatin) .Marland Kitchen.. 1 tab by mouth daily 7)  Lumigan 0.03 % Soln (Bimatoprost) .Marland Kitchen.. 1 drop ou at bedtime.  dr. Mitzi Davenport. 8)  Cozaar 50 Mg Tabs (Losartan potassium) .Marland Kitchen.. 1 tab by mouth daily 9)  Gabapentin 600 Mg Tabs (Gabapentin) .Marland Kitchen.. 1 by mouth three times a day--dr. cram 10)  Ibuprofen 800 Mg Tabs (Ibuprofen) .Marland Kitchen.. 1 by mouth three times a day as needed  pain 11)  Boost Diabetic Liqd (Nutritional supplements) .Marland Kitchen.. 1 can by mouth two times a day 12)  Oxybutynin Chloride 5 Mg Tabs (Oxybutynin chloride) .Marland Kitchen.. 1 by mouth at bedtime  dr Anne Hahn 13)  Cymbalta 60 Mg Cpep (Duloxetine hcl) .Marland Kitchen.. 1 cap by mouth daily--dr. cram  Other Orders: T-Comprehensive Metabolic Panel ([id number]) T-CBC w/Diff (35573-22025)  Patient Instructions: 1)  Follow up with Dr. Delrae Alfred in 4 months --DM, htn Prescriptions: COZAAR 50 MG TABS (LOSARTAN POTASSIUM) 1 tab by mouth daily  #30 x 11   Entered and Authorized by:   Julieanne Manson MD   Signed by:   Julieanne Manson MD on 01/14/2010   Method used:   Electronically to        [facility] [address] (retail)       [address]       [place], [place]  [address]       Ph: [phone]       Fax: [phone]   RxID:   [id number] SIMVASTATIN 40 MG TABS (SIMVASTATIN) 1 tab by mouth daily  #30 x 11   Entered and Authorized by:   Julieanne Manson MD   Signed by:   Julieanne Manson MD on 01/14/2010   Method used:   Electronically to        [facility] [address] (retail)       [address]       [place], [place]  [address]       Ph: [phone]       Fax: [phone]   RxID:   [id number] TRIAMCINOLONE ACETONIDE 0.1 %  CREA (TRIAMCINOLONE ACETONIDE) apply two times a day to affected area as needed  #60 g x 0   Entered and Authorized by:   Julieanne Manson MD   Signed by:   Julieanne Manson MD on 01/14/2010   Method used:   Electronically to        [facility] [address] (retail)       [address] [place], [place]  [address]       Ph: [phone]       Fax: [phone]   RxID:   [id number] HYDROCHLOROTHIAZIDE 25 MG  TABS (HYDROCHLOROTHIAZIDE) 1 tab by mouth daily  #30 Tablet x 11   Entered and Authorized by:   Julieanne Manson MD   Signed by:   Julieanne Manson MD on 01/14/2010   Method used:    Electronically to        [facility] [address] (retail)       [address]  [place], [place]  [address]       Ph: [phone]       Fax: [phone]   RxID:   [id number] NEXIUM 40 MG  PACK (ESOMEPRAZOLE MAGNESIUM) Take 1 tablet by mouth two times a day  #60 x 11   Entered and Authorized by:   Julieanne Manson MD   Signed by:   Julieanne Manson MD on 01/14/2010   Method used:   Electronically to        [facility] [address] (retail)       [address] [name], [place]  [address]       Ph: [phone]       Fax: [phone]   RxID:   [id number]   Appended Document: f/u and needs labs /tmm  Laboratory Results   Urine Tests    Routine Urinalysis   Glucose: negative   (Normal Range: Negative) Bilirubin: negative   (Normal Range: Negative) Ketone: negative   (Normal Range: Negative) Spec. Gravity: 1.010   (Normal Range: 1.003-1.035) Blood: negative   (Normal Range: Negative) pH: 5.0   (Normal Range: 5.0-8.0) Protein: negative   (Normal Range: Negative) Urobilinogen: 1.0   (Normal Range: 0-1) Nitrite: negative   (Normal Range: Negative) Leukocyte Esterace: trace   (Normal Range: Negative)

## 2011-01-22 NOTE — Letter (Signed)
Summary: [facility]   Imported By: Arta Bruce 01/08/2011 14:59:18  _____________________________________________________________________  External Attachment:    Type:   Image     Comment:   External Document

## 2011-01-22 NOTE — Progress Notes (Signed)
Summary: Query:  Refill Nexium?  Phone Note Outgoing Call   Summary of Call: Last seen 06/2010.  Refill Nexium per protocol? Initial call taken by: Dutch Quint RN,  January 02, 2011 12:04 PM  Follow-up for Phone Call        Fill for 6 months Follow-up by: Julieanne Manson MD,  January 05, 2011 3:08 PM  Additional Follow-up for Phone Call Additional follow up Details #1::        Noted.  Refills completed.  Dutch Quint RN  January 05, 2011 5:17 PM

## 2011-01-22 NOTE — Miscellaneous (Signed)
Summary: Orders Update  Clinical Lists Changes 

## 2011-01-22 NOTE — Letter (Signed)
Summary: DIABETES ORDER REQUEST//CORRECTED/FAXED  DIABETES ORDER REQUEST//CORRECTED/FAXED   Imported By: Arta Bruce 11/06/2010 11:36:07  _____________________________________________________________________  External Attachment:    Type:   Image     Comment:   External Document

## 2011-01-22 NOTE — Letter (Signed)
Summary: *Referral Letter  [facility]  [address] [place], [place] [address]   Phone: [phone]  Fax: [phone]    05/15/2010  Thank you in advance for agreeing to see my patient:  Russell [name] [address] [place], [place]  [address]  Phone: [phone]  Reason for Referral: Dyspnea on exertion.  Does have COPD and recently started on Spiriva and Corticosteroid/formoterol inhaler with what pt. feels is minimal improvement.  Has restarted smoking a bit and giving Chantix another go.  Hx of long term smoking, diet controlled DM, Hypertension, hypercholesterolemia.  The latter 3 diagnoses well controlled currently.  Procedures Requested: Evaluation for cardiac etiology of dyspnea.  Current Medical Problems: 1)  DYSPNEA ON EXERTION (ICD-786.09) 2)  OTHER DYSPHAGIA (ICD-787.29) 3)  TOBACCO ABUSE (ICD-305.1) 4)  COPD (ICD-496) 5)  G E R D (ICD-530.81) 6)  PNEUMONIA, RIGHT LOWER LOBE (ICD-486) 7)  MITRAL REGURGITATION, MILD TO MODERATE (ICD-396.3) 8)  ENCOUNTER FOR LONG-TERM USE OF OTHER MEDICATIONS (ICD-V58.69) 9)  DIABETES MELLITUS, TYPE II, CONTROLLED (ICD-250.00) 10)  HYPERPIGMENTATION FACE (ICD-374.52) 11)  BILATERAL C5-6  RADICULOPATHY (ICD-723.4) 12)  LUMBAR RADICULOPATHY, LEFT (ICD-724.4) 13)  DYSPHAGIA UNSPECIFIED (ICD-787.20) 14)  COUGH DUE TO ACE INHIBITORS (ICD-786.2) 15)  EARLY SATIETY (ICD-789.9) 16)  DIZZINESS (ICD-780.4) 17)  HYPERCHOLESTEROLEMIA, MIXED (ICD-272.0) 18)  CATARACTS, BILATERAL (ICD-366.9) 19)  MILD SPINAL STENOSIS, CERVICAL (ICD-723.0) 20)  LEG CRAMPS (ICD-729.82) 21)  EARLY SATIETY (ICD-789.9) 22)  DIVERTICULOSIS, COLON (ICD-562.10) 23)  SCABIES (ICD-133.0) 24)  ABUSE, ALCOHOL SUSPECTED (ICD-305.00) 25)  PARESTHESIA, HANDS W/ARM PAIN (ICD-782.0) 26)  WEIGHT LOSS, ABNORMAL (ICD-783.21) 27)  ERECTILE DYSFUNCTION (ICD-302.72) 28)  HYPERGLYCEMIA (ICD-790.29) 29)  HEMOCCULT POSITIVE STOOL (ICD-578.1) 30)  ANEMIA,  IRON DEFICIENCY NOS (ICD-280.9) 31)  HYPERTENSION (ICD-401.9)   Current Medications: 1)  NEXIUM 40 MG  PACK (ESOMEPRAZOLE MAGNESIUM) Take 1 tablet by mouth two times a day 2)  TRIAMCINOLONE ACETONIDE 0.1 %  CREA (TRIAMCINOLONE ACETONIDE) apply two times a day to affected area as needed 3)  CLOBETASOL PROPIONATE 0.05 %  CREA (CLOBETASOL PROPIONATE) apply two times a day to affected area prn 4)  VICODIN 5-500 MG TABS (HYDROCODONE-ACETAMINOPHEN) 1 tablet two times a day--Dr. Anne Hahn 5)  SIMVASTATIN 80 MG TABS (SIMVASTATIN) 1 tab by mouth daily 6)  LUMIGAN 0.03 % SOLN (BIMATOPROST) 1 drop OU at bedtime.  Dr. Mitzi Davenport. 7)  LOSARTAN POTASSIUM 100 MG TABS (LOSARTAN POTASSIUM) 1 tab by mouth daily 8)  OXYBUTYNIN CHLORIDE 10 MG XR24H-TAB (OXYBUTYNIN CHLORIDE) 1 tab by mouth at bedtime--Dr. Anne Hahn 9)  CYMBALTA 60 MG CPEP (DULOXETINE HCL) 1 cap by mouth daily--Dr. Wynetta Emery 10)  FERROUS SULFATE 325 (65 FE) MG TABS (FERROUS SULFATE) 1 tab by mouth two times a day with 500 mg of Vitamin C daily 11)  VITAMIN C 500 MG CHEW (ASCORBIC ACID) 1 chewtab by mouth two times a day with iron supplement 12)  CARBAMAZEPINE 200 MG TABS (CARBAMAZEPINE) 1 tab by mouth two times a day --Dr. Thana Farr spine radiculopathy 13)  SPIRIVA HANDIHALER 18 MCG CAPS (TIOTROPIUM BROMIDE MONOHYDRATE) once daily 14)  DULERA 100-5 MCG/ACT AERO (MOMETASONE FURO-FORMOTEROL FUM) 2 puffs twice per day 15)  CHANTIX STARTING MONTH PAK 0.5 MG X 11 & 1 MG X 42 TABS (VARENICLINE TARTRATE) 0.5 mg once daily by mouth for 3 days, then two times a day until day 8 when increase to 1 mg by mouth two times a day 16)  CHANTIX 1 MG TABS (VARENICLINE TARTRATE) 1 tab by mouth two times a day --  begin when complete starter pack   Past Medical History: 1)  TOBACCO ABUSE (ICD-305.1) 2)  ABUSE, ALCOHOL, SUSPECTED (ICD-305.00) 3)  PARESTHESIA, HANDS W/ARM PAIN (ICD-782.0) 4)  WEIGHT LOSS, ABNORMAL (ICD-783.21) 5)  ERECTILE DYSFUNCTION (ICD-302.72) 6)   HYPERGLYCEMIA (ICD-790.29) 7)  HEMOCCULT POSITIVE STOOL (ICD-578.1) 8)  ANEMIA, IRON DEFICIENCY NOS (ICD-280.9) 9)  HYPERTENSION (ICD-401.9) 10)  Cervical Spndylosis and spinal stenosis  11)  G E R D 12)  Diverticulitis 13)  Glaucoma 14)  Neurogenic Bladder   Prior History of Blood Transfusions:   Pertinent Labs:    Thank you again for agreeing to see our patient; please contact us if you have any further questions or need additional information.  Sincerely,  Julieanne Manson MD

## 2011-01-22 NOTE — Letter (Signed)
Summary: NUTRITION/SUSIE  NUTRITION/SUSIE   Imported By: Arta Bruce 06/18/2010 15:09:57  _____________________________________________________________________  External Attachment:    Type:   Image     Comment:   External Document

## 2011-01-22 NOTE — Letter (Signed)
Summary: Narda Bonds MD ENT  Narda Bonds MD ENT   Imported By: Sherian Rein 05/26/2010 12:18:33  _____________________________________________________________________  External Attachment:    Type:   Image     Comment:   External Document

## 2011-01-22 NOTE — Assessment & Plan Note (Signed)
Summary: FOLLOW UP VISIT WITH DR Renardo Cheatum IN 4 MOS DM/ HTN//GK   Vital Signs:  Patient profile:   70 year old male Weight:      167 pounds Temp:     97.8 degrees F Pulse rate:   79 / minute Pulse rhythm:    regular Resp:     18 per minute BP sitting:   153 / 83  (left arm) Cuff size:   regular  Vitals Entered By: Vesta Mixer CMA (May 15, 2010 9:03 AM) CC: 4 month f/u dm/htn from January also arms are hurting him a lot, but he has appt with pain doctor next week Is Patient Diabetic? Yes Pain Assessment Patient in pain? yes     Location: arms Intensity: 10 CBG Result 94  Does patient need assistance? Ambulation Normal   Primary Care Provider:  Julieanne Manson MD - PMD, Dr. Anne Hahn - Neurology  CC:  4 month f/u dm/htn from January also arms are hurting him a lot and but he has appt with pain doctor next week.  History of Present Illness: 1.  COPD:  Followed by Dr. Marchelle Gearing, PUlmonary.  Pt. initiated on Spiriva and later [facility].  States a bit better with breathing.  Smoking 3-4 cigarettes daily.  Restarted 1 month ago after stopping for 4-5 months.  Started again as wife smoking around him.  Would like to go on Chantix again, but has not made any plans for working with his wife on not smoking around him.  Sleeps on 3 pillows at night so he can watch TV--does not get short of breath when lies flat.  No PND symptoms.  Denies any chest pain.  Really gets short of breath with walking and does not feel the inhalers have helped much with that.  Can walk 60-70 feet before has to sit and rest.  2.  Dysphagia:  pt. being sent to ENT via pulmonary.  Has had extensive work up for this with Swallowing studies/speech therapy and GI in past. Pt. has already been seen by ENT, Dr. Ezzard Standing,  and was told he needs to have  "something stretched" in his throat.  Not clear if he is speaking about his laryngeal cords or not.  Do not have those records.  3.  Hyperlipidemia:  On Simvastatin 80 mg.   Tolerating fine.  Essentially at goal with cholesterol--LDL at 74.  4.  Anemia:  taking iron in morning.  Missing second dose.  Hgb a bit better.  Pt. states not drinking any liquor now, just a beer twice monthly.  Has had extensive GI workup previously.  5.  DM:  diet controlled A1C today in normal range.  Current Medications (verified): 1)  Nexium 40 Mg  Pack (Esomeprazole Magnesium) .... Take 1 Tablet By Mouth Two Times A Day 2)  Triamcinolone Acetonide 0.1 %  Crea (Triamcinolone Acetonide) .... Apply Two Times A Day To Affected Area As Needed 3)  Clobetasol Propionate 0.05 %  Crea (Clobetasol Propionate) .... Apply Two Times A Day To Affected Area Prn 4)  Vicodin 5-500 Mg Tabs (Hydrocodone-Acetaminophen) .Marland Kitchen.. 1 Tablet Two Times A Day--Dr. Anne Hahn 5)  Simvastatin 80 Mg Tabs (Simvastatin) .Marland Kitchen.. 1 Tab By Mouth Daily 6)  Lumigan 0.03 % Soln (Bimatoprost) .Marland Kitchen.. 1 Drop Ou At Bedtime.  Dr. Mitzi Davenport. 7)  Losartan Potassium 100 Mg Tabs (Losartan Potassium) .Marland Kitchen.. 1 Tab By Mouth Daily 8)  Oxybutynin Chloride 10 Mg Xr24h-Tab (Oxybutynin Chloride) .Marland Kitchen.. 1 Tab By Mouth At Bedtime--Dr. Anne Hahn 9)  Cymbalta  60 Mg Cpep (Duloxetine Hcl) .Marland Kitchen.. 1 Cap By Mouth Daily--Dr. Wynetta Emery 10)  Ferrous Sulfate 325 (65 Fe) Mg Tabs (Ferrous Sulfate) .Marland Kitchen.. 1 Tab By Mouth Two Times A Day With 500 Mg of Vitamin C Daily 11)  Vitamin C 500 Mg Chew (Ascorbic Acid) .Marland Kitchen.. 1 Chewtab By Mouth Two Times A Day With Iron Supplement 12)  Carbamazepine 200 Mg Tabs (Carbamazepine) .Marland Kitchen.. 1 Tab By Mouth Two Times A Day --Dr. Thana Farr Spine Radiculopathy 13)  Spiriva Handihaler 18 Mcg Caps (Tiotropium Bromide Monohydrate) .... Once Daily 14)  Dulera 100-5 Mcg/act Aero (Mometasone Furo-Formoterol Fum) .... 2 Puffs Twice Per Day  Allergies (verified): No Known Drug Allergies  Physical Exam  General:  Thin male, NAD.  Smells of cigarette smoke Lungs:  Decreased breath sounds, but clear.   Heart:  Normal rate and regular rhythm. S1 and S2 normal  without gallop, murmur, click, rub or other extra sounds.  Radial pulses normal and equal Abdomen:  Bowel sounds positive,abdomen soft and non-tender without masses, organomegaly or hernias noted.   Impression & Recommendations:  Problem # 1:  OTHER DYSPHAGIA (ICD-787.29) Send for ENT records  Problem # 2:  TOBACCO ABUSE (ICD-305.1)  Retry Chantix--needs to speak with wife about smoking outside  His updated medication list for this problem includes:    Chantix Starting Month Pak 0.5 Mg X 11 & 1 Mg X 42 Tabs (Varenicline tartrate) .Marland Kitchen... 0.5 mg once daily by mouth for 3 days, then two times a day until day 8 when increase to 1 mg by mouth two times a day    Chantix 1 Mg Tabs (Varenicline tartrate) .Marland Kitchen... 1 tab by mouth two times a day --begin when complete starter pack  Problem # 3:  DYSPNEA ON EXERTION (ICD-786.09)  His updated medication list for this problem includes:    Spiriva Handihaler 18 Mcg Caps (Tiotropium bromide monohydrate) ..... Once daily    Dulera 100-5 Mcg/act Aero (Mometasone furo-formoterol fum) .Marland Kitchen... 2 puffs twice per day  Orders: EKG w/ Interpretation (93[address]) Cardiology Referral (Cardiology):  Voltage criteria for LVH, no ishcemic changes  Problem # 4:  DIABETES MELLITUS, TYPE II, CONTROLLED (ICD-250.00) Well controlled--A1C back in normal range without meds His updated medication list for this problem includes:    Losartan Potassium 100 Mg Tabs (Losartan potassium) .Marland Kitchen... 1 tab by mouth daily  Orders: Capillary Blood Glucose/CBG ([address])  Problem # 5:  HYPERCHOLESTEROLEMIA, MIXED (ICD-272.0) at goal His updated medication list for this problem includes:    Simvastatin 80 Mg Tabs (Simvastatin) .Marland Kitchen... 1 tab by mouth daily  Problem # 6:  HYPERTENSION (ICD-401.9) Up a bit today, but normally controlled His updated medication list for this problem includes:    Losartan Potassium 100 Mg Tabs (Losartan potassium) .Marland Kitchen... 1 tab by mouth daily  Problem # 7:  ANEMIA,  IRON DEFICIENCY NOS (ICD-280.9) To take two times a day and recheck in 2 months His updated medication list for this problem includes:    Ferrous Sulfate 325 (65 Fe) Mg Tabs (Ferrous sulfate) .Marland Kitchen... 1 tab by mouth two times a day with 500 mg of vitamin c daily  Complete Medication List: 1)  Nexium 40 Mg Pack (Esomeprazole magnesium) .... Take 1 tablet by mouth two times a day 2)  Triamcinolone Acetonide 0.1 % Crea (Triamcinolone acetonide) .... Apply two times a day to affected area as needed 3)  Clobetasol Propionate 0.05 % Crea (Clobetasol propionate) .... Apply two times a day to affected area prn 4)  Vicodin  5-500 Mg Tabs (Hydrocodone-acetaminophen) .Marland Kitchen.. 1 tablet two times a day--dr. Anne Hahn 5)  Simvastatin 80 Mg Tabs (Simvastatin) .Marland Kitchen.. 1 tab by mouth daily 6)  Lumigan 0.03 % Soln (Bimatoprost) .Marland Kitchen.. 1 drop ou at bedtime.  dr. Mitzi Davenport. 7)  Losartan Potassium 100 Mg Tabs (Losartan potassium) .Marland Kitchen.. 1 tab by mouth daily 8)  Oxybutynin Chloride 10 Mg Xr24h-tab (Oxybutynin chloride) .Marland Kitchen.. 1 tab by mouth at bedtime--dr. willis 9)  Cymbalta 60 Mg Cpep (Duloxetine hcl) .Marland Kitchen.. 1 cap by mouth daily--dr. cram 10)  Ferrous Sulfate 325 (65 Fe) Mg Tabs (Ferrous sulfate) .Marland Kitchen.. 1 tab by mouth two times a day with 500 mg of vitamin c daily 11)  Vitamin C 500 Mg Chew (Ascorbic acid) .Marland Kitchen.. 1 chewtab by mouth two times a day with iron supplement 12)  Carbamazepine 200 Mg Tabs (Carbamazepine) .Marland Kitchen.. 1 tab by mouth two times a day --dr. Thana Farr spine radiculopathy 13)  Spiriva Handihaler 18 Mcg Caps (Tiotropium bromide monohydrate) .... Once daily 14)  Dulera 100-5 Mcg/act Aero (Mometasone furo-formoterol fum) .... 2 puffs twice per day 15)  Chantix Starting Month Pak 0.5 Mg X 11 & 1 Mg X 42 Tabs (Varenicline tartrate) .... 0.5 mg once daily by mouth for 3 days, then two times a day until day 8 when increase to 1 mg by mouth two times a day 16)  Chantix 1 Mg Tabs (Varenicline tartrate) .Marland Kitchen.. 1 tab by mouth two times a  day --begin when complete starter pack  Other Orders: Hemoglobin A1C ([address])  Patient Instructions: 1)  Release of information from Dr. Narda Bonds for dysphagia. 2)  Follow up with Dr. Delrae Alfred in 2 months --recheck CBC then as well Prescriptions: CHANTIX 1 MG TABS (VARENICLINE TARTRATE) 1 tab by mouth two times a day --begin when complete starter pack  #60 x 1   Entered and Authorized by:   Julieanne Manson MD   Signed by:   Julieanne Manson MD on 05/15/2010   Method used:   Electronically to        [facility] [id number] (retail)       [address] [place], [place]  [address]       Ph: [phone]       Fax: [phone]   RxID:   [id number] CHANTIX STARTING MONTH PAK 0.5 MG X 11 & 1 MG X 42 TABS (VARENICLINE TARTRATE) 0.5 mg once daily by mouth for 3 days, then two times a day until day 8 when increase to 1 mg by mouth two times a day  #1 pack x 0   Entered and Authorized by:   Julieanne Manson MD   Signed by:   Julieanne Manson MD on 05/15/2010   Method used:   Electronically to        [facility] [id number] (retail)       [address] [place], [place]  [address]       Ph: [phone]       Fax: [phone]   RxID:   [id number]   Laboratory Results   Blood Tests     HGBA1C: 5.6%   (Normal Range: Non-Diabetic - 3-6%   Control Diabetic - 6-8%) CBG Random:: 94mg /dL

## 2011-01-22 NOTE — Progress Notes (Signed)
Summary: nos appt  Phone Note Call from Patient   Caller: [email or web address]  Call For: Kathia Covington Summary of Call: Rsc nos from 7/27 to 8/3 @ 4:30p. Initial call taken by: Darletta Moll,  July 17, 2010 10:36 AM

## 2011-01-22 NOTE — Progress Notes (Signed)
Summary: PA for [facility]   Phone Note Other Incoming   Caller: PA for [employer] of Call: PA for [employer]; [employer] [phone].  Initial call taken by: Reynaldo Minium CMA,  May 07, 2010 1:57 PM  Follow-up for Phone Call        Called and spoke with [employer] [facility]; pt must try and fail Symbicort and Advair first. PA paper was placed in [facility] look at box for MR.Reynaldo Minium CMA  May 07, 2010 1:58 PM

## 2011-01-22 NOTE — Medication Information (Signed)
Summary: Tax adviser   Imported By: Lehman Prom 05/29/2010 12:37:26  _____________________________________________________________________  External Attachment:    Type:   Image     Comment:   External Document

## 2011-01-22 NOTE — Assessment & Plan Note (Signed)
Summary: np3/DOE/jml   Visit Type:  Initial Consult Referring Provider:  Dr. Anne Hahn Primary Provider:  Julieanne Manson MD - PMD, Dr. Anne Hahn - Neurology  CC:  vomiting/ dizziness.  History of Present Illness: Russell [name] is seen today at the request of Dr Delrae Alfred.  He has dyspnea.  He is a smoker with COPD.  He is still smoking I counseled him for less than 10 minutes and he indicates he has Chantix available to him  He has had about a 15 lb unintentional weight loss this year.  He has bilateral arm pain.  There is a quenstion of cervical spine disease but this was actually the patients biggest complaint today along with some nausea from gastroenterits.  His dyspnea is baseline.  He denies cough, hemoptysis, sputum, fever.  There are no signs of volume overload edema, PND or orthopnea.  I reviewed his recent echo and he has normal LV function, with no evidence of cor pulmonale and only mild to moderate MR that should not be clinically significant  Current Problems (verified): 1)  Dyspnea On Exertion  (ICD-786.09) 2)  Other Dysphagia  (ICD-787.29) 3)  Tobacco Abuse  (ICD-305.1) 4)  COPD  (ICD-496) 5)  G E R D  (ICD-530.81) 6)  Pneumonia, Right Lower Lobe  (ICD-486) 7)  Mitral Regurgitation, Mild To Moderate  (ICD-396.3) 8)  Encounter For Long-term Use of Other Medications  (ICD-V58.69) 9)  Diabetes Mellitus, Type II, Controlled  (ICD-250.00) 10)  Hyperpigmentation Face  (ICD-374.52) 11)  Bilateral C5-6 Radiculopathy  (ICD-723.4) 12)  Lumbar Radiculopathy, Left  (ICD-724.4) 13)  Dysphagia Unspecified  (ICD-787.20) 14)  Cough Due To Ace Inhibitors  (ICD-786.2) 15)  Early Satiety  (ICD-789.9) 16)  Dizziness  (ICD-780.4) 17)  Hypercholesterolemia, Mixed  (ICD-272.0) 18)  Cataracts, Bilateral  (ICD-366.9) 19)  Mild Spinal Stenosis, Cervical  (ICD-723.0) 20)  Leg Cramps  (ICD-729.82) 21)  Early Satiety  (ICD-789.9) 22)  Diverticulosis, Colon  (ICD-562.10) 23)  Scabies  (ICD-133.0) 24)   Abuse, Alcohol Suspected  (ICD-305.00) 25)  Paresthesia, Hands W/arm Pain  (ICD-782.0) 26)  Weight Loss, Abnormal  (ICD-783.21) 27)  Erectile Dysfunction  (ICD-302.72) 28)  Hyperglycemia  (ICD-790.29) 29)  Hemoccult Positive Stool  (ICD-578.1) 30)  Anemia, Iron Deficiency Nos  (ICD-280.9) 31)  Hypertension  (ICD-401.9)  Current Medications (verified): 1)  Nexium 40 Mg  Pack (Esomeprazole Magnesium) .... Take 1 Tablet By Mouth Two Times A Day 2)  Triamcinolone Acetonide 0.1 %  Crea (Triamcinolone Acetonide) .... Apply Two Times A Day To Affected Area As Needed 3)  Clobetasol Propionate 0.05 %  Crea (Clobetasol Propionate) .... Apply Two Times A Day To Affected Area Prn 4)  Simvastatin 80 Mg Tabs (Simvastatin) .Marland Kitchen.. 1 Tab By Mouth Daily 5)  Lumigan 0.03 % Soln (Bimatoprost) .Marland Kitchen.. 1 Drop Ou At Bedtime.  Dr. Mitzi Davenport. 6)  Losartan Potassium 100 Mg Tabs (Losartan Potassium) .Marland Kitchen.. 1 Tab By Mouth Daily 7)  Oxybutynin Chloride 10 Mg Xr24h-Tab (Oxybutynin Chloride) .Marland Kitchen.. 1 Tab By Mouth At Bedtime--Dr. Anne Hahn 8)  Cymbalta 60 Mg Cpep (Duloxetine Hcl) .Marland Kitchen.. 1 Cap By Mouth Daily--Dr. Wynetta Emery 9)  Ferrous Sulfate 325 (65 Fe) Mg Tabs (Ferrous Sulfate) .Marland Kitchen.. 1 Tab By Mouth Two Times A Day With 500 Mg of Vitamin C Daily 10)  Vitamin C 500 Mg Chew (Ascorbic Acid) .Marland Kitchen.. 1 Chewtab By Mouth Two Times A Day With Iron Supplement 11)  Carbamazepine 200 Mg Tabs (Carbamazepine) .Marland Kitchen.. 1 Tab By Mouth Two Times A Day --Dr. Thana Farr Spine Radiculopathy  12)  Spiriva Handihaler 18 Mcg Caps (Tiotropium Bromide Monohydrate) .... Once Daily 13)  Chantix 1 Mg Tabs (Varenicline Tartrate) .Marland Kitchen.. 1 Tab By Mouth Two Times A Day --Begin When Complete Starter Pack 14)  Symbicort 80-4.5 Mcg/act Aero (Budesonide-Formoterol Fumarate) .... 2 Puffs Twice Daily 15)  Dulera 100-5 Mcg/act Aero (Mometasone Furo-Formoterol Fum) .... Uad 16)  Dolophine 5 Mg Tabs (Methadone Hcl) .... Uad 17)  Losartan Potassium 100 Mg Tabs (Losartan Potassium) .... Take  One Tablet By Mouth Once Daily.  Allergies (verified): No Known Drug Allergies  Past History:  Past Medical History: Last updated: 05/29/2010 HYPERTENSION MR: mild to moderate with bileaflet prolapse on echo 511 Hypercholestrolemia Dyspnea on exertion Mild-to-moderate mitral regurgitation.  G E R D Mild dizziness with negative for orthostasis on standing up.  Benign prostate hypertrophy.  PARESTHESIA, HANDS W/ARM PAIN WEIGHT LOSS, ABNORMAL ERECTILE DYSFUNCTION HYPERGLYCEMIA HEMOCCULT POSITIVE STOOL  ANEMIA, IRON DEFICIENCY NOS Cervical Spndylosis and spinal stenosis  Diverticulitis Glaucoma Community-acquired pneumonia.   Prior history of smoking/chronic obstructive pulmonary disease.  Chronic pain syndrome.  Questionable depression.     Neurogenic Bladder  Past Surgical History: Last updated: 05/29/2010 Knee Arthroscopy Eye surgery 10/16/09:  Intracervical discectomy with fusion  C4-5 and C5-6--Dr. Wynetta Emery  Family History: Last updated: 04/03/2010 No FH of Colon Cancer: Father-diabetes, died of MI, age unknown Sister-cancer? Mother-died at young age unknown causes  Social History: Last updated: 04/03/2010 Occupation: retired Alcohol Use - yes liquior 3 per day per chart, per ptt states quit drinking in 2010 but Dr. Pattricia Boss neuro note 03/18/2010 says he drinks 3 drinks per day Daily Caffeine Use-3 cups of coffee daily Illicit Drug Use - no Patient does not get regular exercise.  Patient states former smoker.  Quit in Oct 2010, smoked x 1957 1 ppd. Per Dr. Anne Hahn neuro note 03/18/2010: contnues to smoke 1ppd  Review of Systems       Denies fever, malais, , blurry vision, decreased visual acuity, cough, sputum, SOB, hemoptysis, pleuritic pain, palpitaitons, heartburn, abdominal pain, melena, lower extremity edema, claudication, or rash.   Vital Signs:  Patient profile:   70 year old male Height:      72 inches Weight:      162 pounds BMI:     22.05 Pulse  rate:   67 / minute BP sitting:   112 / 90  (right arm)  Vitals Entered By: Laurance Flatten CMA (June 03, 2010 2:47 PM)  Physical Exam  General:  Affect appropriate Healthy:  appears stated age HEENT: normal Neck supple with no adenopathy JVP normal no bruits no thyromegaly Lungs clear with no wheezing and good diaphragmatic motion Heart:  S1/S2 no murmur,rub, gallop or click PMI normal Abdomen: benighn, BS positve, no tenderness, no AAA no bruit.  No HSM or HJR Distal pulses intact with no bruits No edema Neuro non-focal Skin warm and dry Pain to palpation over bother humeral bones   Impression & Recommendations:  Problem # 1:  DYSPNEA ON EXERTION (ICD-786.09) Related to COPD.  Counseled regarding smoking cessation.  Continue spireva.   His updated medication list for this problem includes:    Losartan Potassium 100 Mg Tabs (Losartan potassium) .Marland Kitchen... 1 tab by mouth daily    Losartan Potassium 100 Mg Tabs (Losartan potassium) .Marland Kitchen... Take one tablet by mouth once daily.  Problem # 2:  MITRAL REGURGITATION, MILD TO MODERATE (ICD-396.3) F/U echo in a year.  No SBE prophylaxis.  Should not be clinically significant Orders: Echocardiogram (Echo)  Problem #  3:  BILATERAL C5-6  RADICULOPATHY (ICD-723.4) Not clear that arm pain is radicular.  With weight loss would like to screen for CA with myeloma screen upper extremity bone survey and Bone scan.    Problem # 4:  HYPERCHOLESTEROLEMIA, MIXED (ICD-272.0) At goal with no side effects His updated medication list for this problem includes:    Simvastatin 80 Mg Tabs (Simvastatin) .Marland Kitchen... 1 tab by mouth daily  CHOL: 142 (04/29/2010)   LDL: 74 (04/29/2010)   HDL: 50 (04/29/2010)   TG: 90 (04/29/2010)  Problem # 5:  ARM PAIN (ICD-729.5) In the setting of weight loss and focal pain will do Xray to R/O myeloma and bone scan to R/O CA Orders: Misc. Referral (Misc. Ref)  Patient Instructions: 1)  Your physician recommends that you schedule  a follow-up appointment in: YEAR WITH DR Eden Emms 2)  Your physician recommends that you continue on your current medications as directed. Please refer to the Current Medication list given to you today. 3)  Your physician has requested that you have an echocardiogram.  Echocardiography is a painless test that uses sound waves to create images of your heart. It provides your doctor with information about the size and shape of your heart and how well your heart's chambers and valves are working.  This procedure takes approximately one hour. There are no restrictions for this procedure.YEAR SEE DR Eden Emms SAME DAY 4)  BONE SCAN AND ARM XRAY FOR ARM PAIN

## 2011-01-22 NOTE — Letter (Signed)
[facility]   Imported By: Arta Bruce 03/11/2010 14:26:58  _____________________________________________________________________  External Attachment:    Type:   Image     Comment:   External Document

## 2011-01-22 NOTE — Miscellaneous (Signed)
Summary: Orders Update pft charges  Clinical Lists Changes  Orders: Added new Service order of Carbon Monoxide diffusing w/capacity (94720) - Signed Added new Service order of Lung Volumes (94240) - Signed Added new Service order of Spirometry (Pre & Post) (94060) - Signed 

## 2011-01-22 NOTE — Letter (Signed)
Summary: [facility]   Imported By: Arta Bruce 11/18/2010 12:02:52  _____________________________________________________________________  External Attachment:    Type:   Image     Comment:   External Document

## 2011-01-22 NOTE — Assessment & Plan Note (Signed)
Summary: dyspnea w/excertion/apc   Visit Type:  Initial Consult Copy to:  Dr. Anne Hahn Primary Provider/Referring Provider:  Julieanne Manson MD - PMD, Dr. Anne Hahn - Neurology  CC:  Pulmonary COnsult for SOB with exertion. Marland Kitchen  History of Present Illness: IOV 04/03/2010: 70 year old current vs prior smoker. C/o dyspnea for a 'pretty good while". REckons dyspnea 3-6 months. INsidious onset.Dyspnea brought on by exertion. Dyspneic with exertion for walking copule of blocks, sometimes for changing clothes, sometimes for climbing 1 flight of stairs. Rest relieves dyspnea. Course is one of gradual progression. Rates dyspnea as moderate-severe.   Dyspnea is assoicated with cough for "long time" (since oct 2010). Cpiugh started before he quit smoking in Oct 2010 but since then it is worse. Dry cough. Feels like sputum is stuck in throat that he rarely able to bring it out; and is white in color.   Symptoms are assoicated with dysphagia esp for tablets >4-6 months. Recollects barium eval in July 2010 and was negative. This is progressive in nature too. No dysphagia for liquids and solids but does have occ. dysphagia for meat.   Symptoms are associcatd with wheezing and regular  praoxysmal nocturnal dyspnea but denies orthopnea (uses 4 pillows due to c spine surrgery),  hemoptysis, weight loss.   Of note: chart reviewe shows admotted early March 2011 for RLL PNA. Also, he thinks he is here for arm pain but does recollect being told to see [place] by Dr. Anne Hahn although Anne Hahn notes does not reflect that.     Preventive Screening-Counseling & Management  Alcohol-Tobacco     Smoking Status: quit  Current Medications (verified): 1)  Nexium 40 Mg  Pack (Esomeprazole Magnesium) .... Take 1 Tablet By Mouth Two Times A Day 2)  Triamcinolone Acetonide 0.1 %  Crea (Triamcinolone Acetonide) .... Apply Two Times A Day To Affected Area As Needed 3)  Clobetasol Propionate 0.05 %  Crea (Clobetasol Propionate) ....  Apply Two Times A Day To Affected Area Prn 4)  Vicodin 5-500 Mg Tabs (Hydrocodone-Acetaminophen) .Marland Kitchen.. 1 Tablet Two Times A Day--Dr. Anne Hahn 5)  Simvastatin 80 Mg Tabs (Simvastatin) .Marland Kitchen.. 1 Tab By Mouth Daily 6)  Lumigan 0.03 % Soln (Bimatoprost) .Marland Kitchen.. 1 Drop Ou At Bedtime.  Dr. Mitzi Davenport. 7)  Losartan Potassium 100 Mg Tabs (Losartan Potassium) .Marland Kitchen.. 1 Tab By Mouth Daily 8)  Oxybutynin Chloride 10 Mg Xr24h-Tab (Oxybutynin Chloride) .Marland Kitchen.. 1 Tab By Mouth At Bedtime--Dr. Anne Hahn 9)  Cymbalta 60 Mg Cpep (Duloxetine Hcl) .Marland Kitchen.. 1 Cap By Mouth Daily--Dr. Wynetta Emery 10)  Ferrous Sulfate 325 (65 Fe) Mg Tabs (Ferrous Sulfate) .Marland Kitchen.. 1 Tab By Mouth Two Times A Day With 500 Mg of Vitamin C Daily 11)  Vitamin C 500 Mg Chew (Ascorbic Acid) .Marland Kitchen.. 1 Chewtab By Mouth Two Times A Day With Iron Supplement 12)  Carbamazepine 200 Mg Tabs (Carbamazepine) .Marland Kitchen.. 1 Tab By Mouth Two Times A Day --Dr. Thana Farr Spine Radiculopathy  Allergies (verified): No Known Drug Allergies  Past History:  Past Surgical History: Last updated: 01/14/2010 1.  Knee Arthroscopy 2.  Eye surgery 3.  10/2009:  Cspine surgery--Dr. Wynetta Emery  Family History: Last updated: 04/03/2010 No FH of Colon Cancer: Father-diabetes, died of MI, age unknown Sister-cancer? Mother-died at young age unknown causes  Social History: Last updated: 04/03/2010 Occupation: retired Alcohol Use - yes liquior 3 per day per chart, per ptt states quit drinking in 2010 but Dr. Pattricia Boss neuro note 03/18/2010 says he drinks 3 drinks per day Daily Caffeine Use-3 cups of  coffee daily Illicit Drug Use - no Patient does not get regular exercise.  Patient states former smoker.  Quit in Oct 2010, smoked x 1957 1 ppd. Per Dr. 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 Mother-died at young age unknown causes  Social History: Occupation: retired Alcohol Use - yes liquior 3 per day per chart, per ptt states quit drinking in 2010 but Dr. Pattricia Boss neuro note 03/18/2010 says he drinks 3 drinks per day Daily Caffeine Use-3 cups of coffee daily Illicit Drug Use - no Patient does not get regular exercise.  Patient states former smoker.  Quit in Oct 2010, smoked x 1957 1 ppd. Per Dr. Anne Hahn neuro note 03/18/2010: contnues to smoke 1ppd Smoking Status:  quit  Vital Signs:  Patient profile:   70 year old male Height:      72 inches Weight:      175.25 pounds O2 Sat:      97 % on Room air Temp:     97.5 degrees F oral Pulse rate:   66 / minute BP sitting:   122 / 70  (right arm) Cuff size:   regular  Vitals Entered By: Carron Curie CMA (April 03, 2010 9:28 AM)  O2 Flow:  Room air  Physical Exam  General:  well developed, well nourished, in no acute distress Head:  normocephalic and atraumatic Eyes:  PERRLA/EOM intact; conjunctiva and sclera clear Ears:  TMs intact and clear with normal canals Nose:  no deformity, discharge, inflammation, or lesions Mouth:  no deformity or lesions Neck:  no masses, thyromegaly, or abnormal cervical nodes Chest Wall:  no deformities noted Lungs:  clear bilaterally to auscultation and percussiondecreased BS bilateral, hypertympaneic, and prolonged exhilation.   Heart:  regular rate and rhythm, S1, S2 without murmurs, rubs, gallops, or clicks Abdomen:  bowel sounds positive;  abdomen soft and non-tender without masses, or organomegaly Msk:  no deformity or scoliosis noted with normal posture Pulses:  pulses normal Extremities:  no clubbing, cyanosis, edema, or deformity noted Neurologic:  CN II-XII grossly intact with normal reflexes, coordination, muscle strength and tone Skin:  intact without lesions or rashes random scars + Cervical Nodes:  no significant adenopathy Axillary Nodes:  no significant adenopathy Psych:  alert and cooperative; normal mood and affect; normal attention span and concentration   CT of Chest  Procedure date:  02/19/2010  Findings:      IMPRESSION:   Patchy density at the right base consistent with pneumonia.    Three low densities in the liver, unchanged and consistent with   cysts.    Benign-appearing renal cysts.    Atherosclerosis of the aorta and its branch vessels.    Diverticulosis without evidence of diverticulitis.    Read By:  Thomasenia Sales,  M.D.   Released By:  Thomasenia Sales,  M.D.  Comments:      lung cuts independently reviewed by me and concur  Impression & Recommendations:  Problem # 1:  COPD (ICD-496) Assessment New Clinically he  appears to have COPD. THis probably explains dyspnea, cough and wheeze. Did not desaturate on walk test 04/03/2010  plan start spiriva get full pft return after above at fu decide on symbicort and rehab referral  Problem # 2:  TOBACCO ABUSE (ICD-305.1) Assessment: Improved reportedly quit. encouraged continued remission  Problem # 3:  PNEUMONIA, RIGHT LOWER LOBE (ICD-486) Assessment: Unchanged Had RLL infilrates on CT 02/19/2010. At folllowup will decide if he needs repeat cXR or not Orders: Consultation Level V (11914)  Problem # 4:  OTHER DYSPHAGIA (ICD-787.29) Assessment: New chrnic hx of difficulty with pills and meats. Also reporting sensation of sputum stuck in throat. Barium swallow summer 2010 was negative. At followup will decide on ENT  evaluation  Medications Added to Medication List This Visit: 1)  Spiriva Handihaler 18 Mcg Caps (Tiotropium bromide monohydrate) .... One puffs in handihaler daily  Other Orders: Prescription Created Electronically [phone]) HFA Instruction [phone]) Pulmonary Referral (Pulmonary)  Patient Instructions: 1)  I think you have COPD 2)  start spiriva  1 puff daily 3)  my nurse will give you sample and teach you how to use it 4)  have fll breathing test or PFT 5)  Return to see me in 2-4 weeks from now  Prescriptions: SPIRIVA HANDIHALER 18 MCG  CAPS (TIOTROPIUM BROMIDE MONOHYDRATE) one puffs in handihaler daily  #1 x 6   Entered and Authorized by:   Kalman Shan MD   Signed by:   Kalman Shan MD on 04/03/2010   Method used:   Electronically to        [facility] [id number] (retail)       [address] [place], [place]  [address]       Ph: [phone]       Fax: [phone]   RxID:   [id number]     Appended Document: dyspnea w/excertion/apc    Comments Ambulatory Pulse Oximetry  Resting; HR_66____    02 Sat_98____  Lap1 (185 feet)   HR_73___   02 Sat_100____ Lap2 (185 feet)   HR__76___   02 Sat_99____    Lap3 (185 feet)   HR__74___   02 Sat__98___  _X__Test Completed without Difficulty ___Test Stopped due to:

## 2011-01-22 NOTE — Miscellaneous (Signed)
Summary: Orders Update  Clinical Lists Changes  Orders: Added new Referral order of Misc. Referral (Misc. Ref) - Signed 

## 2011-01-22 NOTE — Assessment & Plan Note (Signed)
Summary: 2 MONTH FU/RECHECK CBC/HOSP FU//KT   Vital Signs:  Patient profile:   70 year old male Weight:      153 pounds Temp:     98.5 degrees F Pulse rate:   71 / minute Pulse rhythm:   regular Resp:     18 per minute BP sitting:   134 / 89  (left arm) Cuff size:   regular  Vitals Entered By: Vesta Mixer CMA (July 15, 2010 9:11 AM) CC: 2 month hosp f/u Is Patient Diabetic? No Pain Assessment Patient in pain? yes     Location: left arm Intensity: 9  Does patient need assistance? Ambulation Normal   Primary Care Provider:  Julieanne Manson MD - PMD, Dr. Anne Hahn - Neurology  CC:  2 month hosp f/u.  History of Present Illness: 1.  Nausea and vomiting:  See last OV note:  pt. hospitalized about 24 hours for dehydration.  CT of abdomen and pelvis and labs did not define an etiology for his nausea and vomiting.  Pt. was rehydrated with IV fluids and the next day, was hungry and eating without any further abdominal pain.  Pt. had significant turn around per provider in hospital and were not able to identify as specific cause.  They were considering a gastric empyting study, but pt. was hungry and eating at that point and cancelled the study.  Pt. states was feeling fine until yesterday, started feeling nauseated and dizzy yesterday.  This morning had fried potato, sausage, and  toast.    Vomited back up after brushing teeth-pt. states he gags easily.  No one else ill at home.  2.  Chronic pain syndrome:  mainly regarding neck and arm pain with Cspine radiculopathy:  Pt. shows me Methadone today--prescribed by Dr. Thyra Breed at Pain clinic.  Helps somewhat.  Not aware he has been taking for past 2 months.  Pt. does not believe that is what is making him nauseated.  3.  Hypercholesterolemia:  On Simvastatin 80 mg--not recommended with recent information.  Discussed switching.    Current Medications (verified): 1)  Nexium 40 Mg  Pack (Esomeprazole Magnesium) .... Take 1 Tablet  By Mouth Two Times A Day 2)  Triamcinolone Acetonide 0.1 %  Crea (Triamcinolone Acetonide) .... Apply Two Times A Day To Affected Area As Needed 3)  Clobetasol Propionate 0.05 %  Crea (Clobetasol Propionate) .... Apply Two Times A Day To Affected Area Prn 4)  Simvastatin 80 Mg Tabs (Simvastatin) .Marland Kitchen.. 1 Tab By Mouth Daily 5)  Lumigan 0.03 % Soln (Bimatoprost) .Marland Kitchen.. 1 Drop Ou At Bedtime.  Dr. Mitzi Davenport. 6)  Losartan Potassium 100 Mg Tabs (Losartan Potassium) .Marland Kitchen.. 1 Tab By Mouth Daily 7)  Oxybutynin Chloride 10 Mg Xr24h-Tab (Oxybutynin Chloride) .Marland Kitchen.. 1 Tab By Mouth At Bedtime--Dr. Anne Hahn 8)  Cymbalta 60 Mg Cpep (Duloxetine Hcl) .Marland Kitchen.. 1 Cap By Mouth Daily--Dr. Wynetta Emery 9)  Ferrous Sulfate 325 (65 Fe) Mg Tabs (Ferrous Sulfate) .Marland Kitchen.. 1 Tab By Mouth Two Times A Day With 500 Mg of Vitamin C Daily 10)  Vitamin C 500 Mg Chew (Ascorbic Acid) .Marland Kitchen.. 1 Chewtab By Mouth Two Times A Day With Iron Supplement 11)  Carbamazepine 200 Mg Tabs (Carbamazepine) .Marland Kitchen.. 1 Tab By Mouth Two Times A Day --Dr. Thana Farr Spine Radiculopathy 12)  Spiriva Handihaler 18 Mcg Caps (Tiotropium Bromide Monohydrate) .... Once Daily 13)  Chantix 1 Mg Tabs (Varenicline Tartrate) .Marland Kitchen.. 1 Tab By Mouth Two Times A Day --Begin When Complete Starter Pack 14)  Symbicort  80-4.5 Mcg/act Aero (Budesonide-Formoterol Fumarate) .... 2 Puffs Twice Daily 15)  Dulera 100-5 Mcg/act Aero (Mometasone Furo-Formoterol Fum) .... Uad 16)  Dolophine 5 Mg Tabs (Methadone Hcl) .... Uad 17)  Losartan Potassium 100 Mg Tabs (Losartan Potassium) .... Take One Tablet By Mouth Once Daily.  Allergies (verified): No Known Drug Allergies  Physical Exam  General:  NAD, thin male Lungs:  Normal respiratory effort, chest expands symmetrically. Lungs are clear to auscultation, no crackles or wheezes. Heart:  Normal rate and regular rhythm. S1 and S2 normal without gallop, murmur, click, rub or other extra sounds. Abdomen:  soft, normal bowel sounds, no distention, no masses, no  guarding, no rigidity, no rebound tenderness, no hepatomegaly, and no splenomegaly.  Minimal epigastric tenderness.   Impression & Recommendations:  Problem # 1:  NAUSEA AND VOMITING (ICD-787.01) Suspect pt. was quite dehydrated at last visit and improved with nausea and vomiting secondary to the IV rehydration.  May have been a viral etiology to initially set this off--not clear.  Seems better now, despite return of nausea and vomiting. Encouraged pt. to eat a bland diet instead of fried fatty diet until his current symptoms resolve.   To call if no improvement or a worsening over next 48 hours  Problem # 2:  ARM PAIN (ICD-729.5) As per pain clinic.  Problem # 3:  TOBACCO ABUSE (ICD-305.1) Continues to smoke The following medications were removed from the medication list:    Chantix 1 Mg Tabs (Varenicline tartrate) .Marland Kitchen... 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 1 tab by mouth daily with meal  Complete Medication List: 1)  Nexium 40 Mg Pack (Esomeprazole magnesium) .... Take 1 tablet by mouth two times a day 2)  Triamcinolone Acetonide 0.1 % Crea (Triamcinolone acetonide) .... Apply two times a day to affected area as needed 3)  Pravastatin Sodium 80 Mg Tabs (Pravastatin sodium) .Marland Kitchen.. 1 tab by mouth daily with meal 4)  Lumigan 0.03 % Soln (Bimatoprost) .Marland Kitchen.. 1 drop ou at bedtime.  dr. Mitzi Davenport. 5)  Losartan Potassium 100 Mg Tabs (Losartan potassium) .Marland Kitchen.. 1 tab by mouth daily 6)  Oxybutynin Chloride 10 Mg Xr24h-tab (Oxybutynin chloride) .Marland Kitchen.. 1 tab by mouth at bedtime--dr. willis 7)  Cymbalta 60 Mg Cpep (Duloxetine hcl) .Marland Kitchen.. 1 cap by mouth daily--dr.  cram 8)  Ferrous Sulfate 325 (65 Fe) Mg Tabs (Ferrous sulfate) .Marland Kitchen.. 1 tab by mouth two times a day with 500 mg of vitamin c daily 9)  Vitamin C 500 Mg Chew (Ascorbic acid) .Marland Kitchen.. 1 chewtab by mouth two times a day with iron supplement 10)  Carbamazepine 200 Mg Tabs (Carbamazepine) .Marland Kitchen.. 1 tab by mouth two times a day --dr. Thana Farr spine radiculopathy 11)  Spiriva Handihaler 18 Mcg Caps (Tiotropium bromide monohydrate) .... Once daily 12)  Symbicort 80-4.5 Mcg/act Aero (Budesonide-formoterol fumarate) .... 2 puffs twice daily 13)  Symbicort 80-4.5 Mcg/act Aero (Budesonide-formoterol fumarate) .... 2 puffs two times a day 14)  Methadone Hcl 5 Mg Tabs (Methadone hcl) .Marland Kitchen.. 1 tab po every 6 hours as needed:pain clinic--dr. mark phillips--for 15)  Losartan Potassium 100 Mg Tabs (Losartan potassium) .... Take one tablet by mouth once daily.  Patient Instructions: 1)  Bland diet--clear liquids, chicken noodle soup, rice, crackers--no fried or heavy foods. 2)  Call if do not improve in next 48 hours. 3)  Schedule fasting lab appt.--FLP and liver profile in 8 weeks--stop Simvastatin and start Pravastatin--let pharmacy know Simvastatin stopped Prescriptions: PRAVASTATIN SODIUM 80 MG TABS (PRAVASTATIN SODIUM) 1 tab by mouth daily with meal  #30 x 11   Entered and Authorized by:   Julieanne Manson MD   Signed by:   Julieanne Manson MD on 07/15/2010   Method used:   Electronically to        [facility] [id number] (retail)       [address] [place], [place]  [address]       Ph: [phone]       Fax: [phone]   RxID:   [id number]

## 2011-01-22 NOTE — Letter (Signed)
Summary: Lipid Letter  [facility]  [address] [place], [place] [address]   Phone: [phone]  Fax: [phone]    10/17/2010  Russell [name] [address] [place], [place]  [address]  Dear Russell [name]:  We have carefully reviewed your last lipid profile from 09/10/2010 and the results are noted below with a summary of recommendations for lipid management.    Cholesterol:       155     Goal: <200   HDL "good" Cholesterol:   45     Goal: >45   LDL "bad" Cholesterol:   79     Goal: <70   Triglycerides:       155     Goal: <150    Your cholesterol is pretty close to goal with Pravastatin--continue that medication.    TLC Diet (Therapeutic Lifestyle Change): Saturated Fats & Transfatty acids should be kept < 7% of total calories ***Reduce Saturated Fats Polyunstaurated Fat can be up to 10% of total calories Monounsaturated Fat Fat can be up to 20% of total calories Total Fat should be no greater than 25-35% of total calories Carbohydrates should be 50-60% of total calories Protein should be approximately 15% of total calories Fiber should be at least 20-30 grams a day ***Increased fiber may help lower LDL Total Cholesterol should be < 200mg /day Consider adding plant stanol/sterols to diet (example: Benacol spread) ***A higher intake of unsaturated fat may reduce Triglycerides and Increase HDL    Adjunctive Measures (may lower LIPIDS and reduce risk of Heart Attack) include: Aerobic Exercise (20-30 minutes 3-4 times a week) Limit Alcohol Consumption Weight Reduction Aspirin 75-81 mg a day by mouth (if not allergic or contraindicated) Dietary Fiber 20-30 grams a day by mouth     Current Medications: 1)    Nexium 40 Mg  Pack (Esomeprazole magnesium) .... Take 1 tablet by mouth two times a day 2)    Triamcinolone Acetonide 0.1 %  Crea (Triamcinolone acetonide) .... Apply two times a day to affected area as needed 3)    Pravastatin Sodium  80 Mg Tabs (Pravastatin sodium) .Marland Kitchen.. 1 tab by mouth daily with meal 4)    Lumigan 0.03 % Soln (Bimatoprost) .Marland Kitchen.. 1 drop ou at bedtime.  dr. Mitzi Davenport. 5)    Losartan Potassium 100 Mg Tabs (Losartan potassium) .Marland Kitchen.. 1 tab by mouth daily 6)    Oxybutynin Chloride 10 Mg Xr24h-tab (Oxybutynin chloride) .Marland Kitchen.. 1 tab by mouth at bedtime--dr. willis 7)    Cymbalta 60 Mg Cpep (Duloxetine hcl) .Marland Kitchen.. 1 cap by mouth daily--dr. cram 8)    Ferrous Sulfate 325 (65 Fe) Mg Tabs (Ferrous sulfate) .Marland Kitchen.. 1 tab by mouth two times a day with 500 mg of vitamin c daily 9)    Vitamin C 500 Mg Chew (Ascorbic acid) .Marland Kitchen.. 1 chewtab by mouth two times a day with iron supplement 10)    Carbamazepine 200 Mg Tabs (Carbamazepine) .Marland Kitchen.. 1 tab by mouth two times a day --dr. Thana Farr spine radiculopathy 11)    Spiriva Handihaler 18 Mcg Caps (Tiotropium bromide monohydrate) .... Once daily 12)    Symbicort 80-4.5 Mcg/act Aero (Budesonide-formoterol fumarate) .... 2 puffs twice daily 13)    Symbicort 80-4.5 Mcg/act Aero (Budesonide-formoterol fumarate) .... 2 puffs two times a day 14)    Methadone Hcl 5 Mg Tabs (Methadone hcl) .Marland Kitchen.. 1 tab po every 6 hours as needed:pain clinic--dr. mark phillips--for 15)    Losartan Potassium 100 Mg Tabs (Losartan potassium) .Marland KitchenMarland KitchenMarland Kitchen  Take one tablet by mouth once daily.  If you have any questions, please call. We appreciate being able to work with you.   Sincerely,    [facility] Julieanne Manson MD

## 2011-01-22 NOTE — Progress Notes (Signed)
Summary: [facility] - Office Visit  [facility] - [facility] Visit   Imported By: Earl Many 06/02/2010 11:52:44  _____________________________________________________________________  External Attachment:    Type:   Image     Comment:   External Document

## 2011-01-22 NOTE — Letter (Signed)
Summary: [facility]//FAXED  [facility]//FAXED   Imported By: Arta Bruce 04/17/2010 10:11:11  _____________________________________________________________________  External Attachment:    Type:   Image     Comment:   External Document

## 2011-01-22 NOTE — Assessment & Plan Note (Signed)
Summary: ROV AFTER PFT ///KP   Visit Type:  Follow-up Copy to:  Dr. Anne Hahn Primary Provider/Referring Provider:  Julieanne Manson MD - PMD, Dr. Anne Hahn - Neurology  CC:  Pt here for follow-up to discuss PFT. Marland Kitchen  History of Present Illness: IOV 04/03/2010: 70 year old current vs prior smoker. C/o dyspnea for a 'pretty good while". REckons dyspnea 3-6 months. INsidious onset.Dyspnea brought on by exertion. Dyspneic with exertion for walking copule of blocks, sometimes for changing clothes, sometimes for climbing 1 flight of stairs. Rest relieves dyspnea. Course is one of gradual progression. Rates dyspnea as moderate-severe.   Dyspnea is assoicated with cough for "long time" (since oct 2010). Cpiugh started before he quit smoking in Oct 2010 but since then it is worse. Dry cough. Feels like sputum is stuck in throat that he rarely able to bring it out; and is white in color.   Symptoms are assoicated with dysphagia esp for tablets >4-6 months. Recollects barium eval in July 2010 and was negative. This is progressive in nature too. No dysphagia for liquids and solids but does have occ. dysphagia for meat.   Symptoms are associcatd with wheezing and regular  praoxysmal nocturnal dyspnea but denies orthopnea (uses 4 pillows due to c spine surrgery),  hemoptysis, weight loss.   Of note: chart reviewe shows admotted early March 2011 for RLL PNA. Also, he thinks he is here for arm pain but does recollect being told to see [place] by Dr. Anne Hahn although Anne Hahn notes does not reflect that.   REC: start spiriva -> have full PFT -> return for fu  OV 05/06/2010: Fpollowup after spiriva trial and having PFTs. STates that spiriva has helped dyspnea and cough somewhat. STill wheezing. Still with dysphagia. Still with paroxysmal nocturnal dyspnea.  PFts today show mixed obstn-restn with BD response on spior, airtrapping and hyperfinaltion on lung volumes and mildly reduced DLCO.  Unclear if he is smoking. At one point  he asked me to prescribe nicotine patches but when I questioned if he is still smoking he denied it  Current Medications (verified): 1)  Nexium 40 Mg  Pack (Esomeprazole Magnesium) .... Take 1 Tablet By Mouth Two Times A Day 2)  Triamcinolone Acetonide 0.1 %  Crea (Triamcinolone Acetonide) .... Apply Two Times A Day To Affected Area As Needed 3)  Clobetasol Propionate 0.05 %  Crea (Clobetasol Propionate) .... Apply Two Times A Day To Affected Area Prn 4)  Vicodin 5-500 Mg Tabs (Hydrocodone-Acetaminophen) .Marland Kitchen.. 1 Tablet Two Times A Day--Dr. Anne Hahn 5)  Simvastatin 80 Mg Tabs (Simvastatin) .Marland Kitchen.. 1 Tab By Mouth Daily 6)  Lumigan 0.03 % Soln (Bimatoprost) .Marland Kitchen.. 1 Drop Ou At Bedtime.  Dr. Mitzi Davenport. 7)  Losartan Potassium 100 Mg Tabs (Losartan Potassium) .Marland Kitchen.. 1 Tab By Mouth Daily 8)  Oxybutynin Chloride 10 Mg Xr24h-Tab (Oxybutynin Chloride) .Marland Kitchen.. 1 Tab By Mouth At Bedtime--Dr. Anne Hahn 9)  Cymbalta 60 Mg Cpep (Duloxetine Hcl) .Marland Kitchen.. 1 Cap By Mouth Daily--Dr. Wynetta Emery 10)  Ferrous Sulfate 325 (65 Fe) Mg Tabs (Ferrous Sulfate) .Marland Kitchen.. 1 Tab By Mouth Two Times A Day With 500 Mg of Vitamin C Daily 11)  Vitamin C 500 Mg Chew (Ascorbic Acid) .Marland Kitchen.. 1 Chewtab By Mouth Two Times A Day With Iron Supplement 12)  Carbamazepine 200 Mg Tabs (Carbamazepine) .Marland Kitchen.. 1 Tab By Mouth Two Times A Day --Dr. Thana Farr Spine Radiculopathy 13)  Spiriva Handihaler 18 Mcg Caps (Tiotropium Bromide Monohydrate) .... Once Daily  Allergies (verified): No Known Drug Allergies  Past History:  Family History: Last updated: 04/03/2010 No FH of Colon Cancer: Father-diabetes, died of MI, age unknown Sister-cancer? Mother-died at young age unknown causes  Social History: Last updated: 04/03/2010 Occupation: retired Alcohol Use - yes liquior 3 per day per chart, per ptt states quit drinking in 2010 but Dr. Pattricia Boss neuro note 03/18/2010 says he drinks 3 drinks per day Daily Caffeine Use-3 cups of coffee daily Illicit Drug Use - no Patient does  not get regular exercise.  Patient states former smoker.  Quit in Oct 2010, smoked x 1957 1 ppd. Per Dr. Anne Hahn neuro note 03/18/2010: contnues to smoke 1ppd  Risk Factors: Exercise: no (09/10/2008)  Risk Factors: Smoking Status: quit (04/03/2010) Packs/Day: 1.0 (11/07/2009)  Past Medical History: Reviewed history from 04/03/2010 and no changes required. 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 1.  Knee Arthroscopy 2.  Eye surgery 3.  10/2009:  Cspine surgery--Dr. Wynetta Emery  Family History: Reviewed history from 04/03/2010 and no changes required. No FH of Colon Cancer: Father-diabetes, died of MI, age unknown Sister-cancer? Mother-died at young age unknown causes  Social History: Reviewed history from 04/03/2010 and no changes required. Occupation: retired Alcohol Use - yes liquior 3 per day per chart, per ptt states quit drinking in 2010 but Dr. Pattricia Boss neuro note 03/18/2010 says he drinks 3 drinks per day Daily Caffeine Use-3 cups of coffee daily Illicit Drug Use - no Patient does not get regular exercise.  Patient states former smoker.  Quit in Oct 2010, smoked x 1957 1 ppd. Per Dr. Anne Hahn neuro note 03/18/2010: contnues to smoke 1ppd  Review of Systems       The patient complains of shortness of breath with activity.  The patient denies shortness of breath at rest, productive cough, non-productive cough, coughing up blood, chest pain, irregular heartbeats, acid heartburn, indigestion, loss of appetite, weight change, abdominal pain,  difficulty swallowing, sore throat, tooth/dental problems, headaches, nasal congestion/difficulty breathing through nose, sneezing, itching, ear ache, anxiety, depression, hand/feet swelling, joint stiffness or pain, rash, change in color of mucus, and fever.    Vital Signs:  Patient profile:   70 year old male Height:      72 inches Weight:      175 pounds O2 Sat:      98 % on Room air Temp:     98.4 degrees F oral Pulse rate:   75 / minute BP sitting:   124 / 72  (left arm) Cuff size:   regular  Vitals Entered By: Carron Curie CMA (May 06, 2010 3:40 PM)  O2 Flow:  Room air CC: Pt here for follow-up to discuss PFT.  Comments Medications reviewed with patient Carron Curie CMA  May 06, 2010 3:41 PM Daytime phone number verified with patient.    Physical Exam  General:  well developed, well nourished, in no acute distress Head:  normocephalic and atraumatic Eyes:  PERRLA/EOM intact; conjunctiva and sclera clear Ears:  TMs intact and clear with normal canals Nose:  no deformity, discharge, inflammation, or lesions Mouth:  no deformity or lesions Neck:  no masses, thyromegaly, or abnormal cervical nodes Chest Wall:  no deformities noted Lungs:  clear bilaterally to auscultation and percussiondecreased BS bilateral,  hypertympaneic, and prolonged exhilation.   Heart:  regular rate and rhythm, S1, S2 without murmurs, rubs, gallops, or clicks Abdomen:  bowel sounds positive; abdomen soft and non-tender without masses, or organomegaly Msk:  no deformity or scoliosis noted with normal posture Pulses:  pulses normal Extremities:  no clubbing, cyanosis, edema, or deformity noted Neurologic:  CN II-XII grossly intact with normal reflexes, coordination, muscle strength and tone Skin:  intact without lesions or rashes random scars + Cervical Nodes:  no significant adenopathy Axillary Nodes:  no significant adenopathy Psych:  alert and cooperative; normal mood and affect; normal  attention span and concentration   MISC. Report  Procedure date:  05/06/2010  Findings:       PFts today show mixed obstn-restn with BD response on spior, airtrapping and hyperfinaltion on lung volumes and mildly reduced DLCO.   Impression & Recommendations:  Problem # 1:  COPD (ICD-496) Assessment Improved Somewhat better in terms of dyspnea and cough. Still wheezing. STill with paroxysmal nocutrnal dyspnea. PFTs today done while on spiriva suggest copd with asthma  plan continue spiiva add dulera - samples given, mdi tech taught rov 2 months with spirometry  Problem # 2:  TOBACCO ABUSE (ICD-305.1) Assessment: Unchanged  reprotedly quit but I am unsure becuase he asked for nicotine patchers and then denied he smoked. Wil monitor  Orders: Est. Patient Level IV ([address])  Problem # 3:  OTHER DYSPHAGIA (UEA-540.98) Assessment: Unchanged  still persists  plan refer ent consult  Orders: Est. Patient Level IV ([address])  Problem # 4:  PNEUMONIA, RIGHT LOWER LOBE (ICD-486)  Had RLL infilrates on CCXR 02/19/2010. I reviwed that xray again today 05/06/2010 and I not sure there really is an infilrtate. Official reports states no infiltrate. Will recheck this xray at next visit. Will monitor  Orders: Est. Patient Level IV ([address])  Medications Added to Medication List This Visit: 1)  Spiriva Handihaler 18 Mcg Caps (Tiotropium bromide monohydrate) .... Once daily 2)  Dulera 100-5 Mcg/act Aero (Mometasone furo-formoterol fum) .... 2 puffs twice per day  Other Orders: ENT Referral (ENT)  Patient Instructions: 1)  continue spiriva 1 puff daily 2)  start dulera 2 puff two times a day 3)  show mdi technique to my nurse 4)  return in 6-8 weeks to report progress 5)  I am refering you to ENT doctor for your swallowing problem 6)  at followup do spirometry Prescriptions: SPIRIVA HANDIHALER 18 MCG CAPS (TIOTROPIUM BROMIDE MONOHYDRATE) once daily  #1 x 6   Entered and Authorized by:    Kalman Shan MD   Signed by:   Kalman Shan MD on 05/06/2010   Method used:   Electronically to        [facility] [id number] (retail)       [address] [place], [place]  [address]       Ph: [phone]       Fax: [phone]   RxID:   [id number] DULERA 100-5 MCG/ACT AERO (MOMETASONE FURO-FORMOTEROL FUM) 2 puffs twice per day  #1 x 6   Entered and Authorized by:   Kalman Shan MD   Signed by:   Kalman Shan MD on 05/06/2010   Method used:   Electronically to        [facility] [id number] (retail)       [address]  [place], [place]  [address]       Ph: [phone]       Fax: [phone]   RxID:   [id number]     Appended Document: ROV AFTER PFT ///KP looks like pharmacy will approve only symbicort or spiriva. If he has enough dulera samples till nextvisit leave it be. Otherwise, change to symbicort  Appended Document: ROV AFTER PFT ///KP spoke with pt and he does not have enough of the dulera to last until appt so I have sent in rx for Symbicort 80 to [facility]. pt aware.

## 2011-01-22 NOTE — Progress Notes (Signed)
Summary: Needs oxybutynin  Phone Note Call from Patient   Summary of Call: DR. Delrae Alfred WAS SUPPOSE TO CALL IN SOME KIDNEY PILLS ,BUT NEVER DID CALL IN///PHARMACY/[facility] ////[facility]//[phone] Initial call taken by: Arta Bruce,  November 19, 2010 11:35 AM  Follow-up for Phone Call        Pt. calling about oxybutynin -- med was Rx'd by Dr. Pattricia Boss, pt. advised to call that office for refills. Follow-up by: Dutch Quint RN,  November 19, 2010 11:44 AM

## 2011-01-22 NOTE — Progress Notes (Signed)
Summary: [facility] DIDNT GET [facility] MED  Phone Note Call from Patient Call back at [facility] Phone [phone]   Summary of Call: MR Russell [name] CALLED AND SAYS THAT [facility] DID NOT HAVE THE PRAVASTATIN FOR HIM THAT DR Navneet Schmuck DID ON 07/26. Initial call taken by: Leodis Rains,  July 18, 2010 2:53 PM    Prescriptions: PRAVASTATIN SODIUM 80 MG TABS (PRAVASTATIN SODIUM) 1 tab by mouth daily with meal  #30 x 11   Entered and Authorized by:   Julieanne Manson MD   Signed by:   Julieanne Manson MD on 07/19/2010   Method used:   Electronically to        [facility] [id number] (retail)       [address] [place], [place]  [address]       Ph: [phone]       Fax: [phone]   RxID:   [id number]

## 2011-01-22 NOTE — Miscellaneous (Signed)
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

## 2011-01-22 NOTE — Medication Information (Signed)
Summary: PHYSICIANS ORDER REQUEST/ DIABETES  PHYSICIANS ORDER REQUEST/ DIABETES   Imported By: Arta Bruce 10/31/2010 10:06:28  _____________________________________________________________________  External Attachment:    Type:   Image     Comment:   External Document

## 2011-01-22 NOTE — Assessment & Plan Note (Signed)
Summary: ED f/u for recurrent N&V /tmm   Vital Signs:  Patient profile:   70 year old male Weight:      151 pounds (68.64 kg) Temp:     97.9 degrees F (36.61 degrees C) Pulse rate:   54 / minute Pulse rhythm:   regular Resp:     20 per minute BP supine:   126 / 80 BP sitting:   149 / 104  (left arm) BP standing:   156 / 96 Cuff size:   regular  Vitals Entered By: Vesta Mixer CMA (July 03, 2010 2:04 PM)  CC: having recurrent nausea and vomitting has been to the ED 3 times Is Patient Diabetic? No  Does patient need assistance? Ambulation Normal   Primary Care Provider:  Julieanne Manson MD - PMD, Dr. Anne Hahn - Neurology  CC:  having recurrent nausea and vomitting has been to the ED 3 times.  History of Present Illness: 1.  Nausea and Vomiting:  was seen in ED for this 07/01/10.  Looking at the history then, pt. had been seen a week earlier for the same reason.  Pt. 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 Ezzard Standing, ENT.  Pt. states his symptoms of dysphagia for which he had previously had an extensive work up have resolved.  When drinks even liquid--15-20 minutes later, he brings everything back up.  Was also hospitialized for a couple of days starting 6/30 for the same.  His symptoms improved with hydration and entiemetics.  He was felt to have some constipation and ultimately discharged after 2 days.  2 Days later, started with nausea and vomiting all over again.  Antiemetics have not been helpful--including Zofran ODT with last ED visit.  Not using Miralax prescribed with Hospitalization 6/30.  CBC, CMET, UA and urine culture, Lipase all unremarkable on 07/01/10.  No definite fever, no diarrhea. Surprisingly, has been urinating 3-4 times daily--but somewhat dark urine.  Is having some sore pain below umbilicus at times--more so when vomits.  Vomits all day long.  Has not been able to keep  anything down really for past 3-4 days.    Quit smoking--stopped when he could not pass urine.  Had one beer about 2 weeks ago.  2.  Urinary obstruction:  pt. was seen by Urology, cannot recall which one.  Was initially seen in ED and foley placed.  Was placed on Flomax with 6/30 hospitalization--no H and P or Discharge summary available--just a consultation report.  Pt. cannot recall if catheter placed during that evaluation or in days preceding.  No problems since catheter removed during 6/30 hospitalization.  Felt to have BPH as cause.   No dysuria currently.  3.  Arm pain:  bone scan and xrays ordered per Dr. Eden Emms, cardiology--had xrays of arms done probably 3 years or more ago for same pain that were normal.  Pt. has not been able to get these done yet secondary to nausea and vomiting.   Allergies (verified): No Known Drug Allergies  Physical Exam  General:  Chronically ill, cachectic male.  Down 11 lbs from 1 month ago Eyes:  Sunken appearing Mouth:  Throat without injection.  Mucous membranes tacky Lungs:  Normal respiratory effort, chest expands symmetrically. Lungs are clear to auscultation, no crackles or wheezes. Heart:  Normal rate and regular rhythm. S1 and S2 normal without gallop, murmur, click, rub or other extra sounds. Abdomen:  soft,  normal bowel sounds, no distention, no masses, no rigidity, no rebound tenderness, no hepatomegaly, and no splenomegaly.  Diffusely tender--describes as a soreness   Impression & Recommendations:  Problem # 1:  DEHYDRATION (ICD-276.51) Pt. orthostatic with 30 point drop in systolic BP when stands. Feel pt. needs admission for further evaluation and rehydration. Pt. has been followed by Dr. Arlyce Dice, [facility] most recently with EGD in 2009 and gastric emptying study and Dr. Madilyn Fireman, [facility] also in 2009--feel one of them should evaluate pt. while hospitalized. UA unremarkable today as well.  Problem # 2:  NAUSEA AND VOMITING  (ICD-787.01) As above. This is becoming a quite prolonged problem--pt. needs to be rehydrated IV as unable to keep anything down and further evaluated for etiology  Complete Medication List: 1)  Nexium 40 Mg Pack (Esomeprazole magnesium) .... Take 1 tablet by mouth two times a day 2)  Triamcinolone Acetonide 0.1 % Crea (Triamcinolone acetonide) .... Apply two times a day to affected area as needed 3)  Clobetasol Propionate 0.05 % Crea (Clobetasol propionate) .... Apply two times a day to affected area prn 4)  Simvastatin 80 Mg Tabs (Simvastatin) .Marland Kitchen.. 1 tab by mouth daily 5)  Lumigan 0.03 % Soln (Bimatoprost) .Marland Kitchen.. 1 drop ou at bedtime.  dr. Mitzi Davenport. 6)  Losartan Potassium 100 Mg Tabs (Losartan potassium) .Marland Kitchen.. 1 tab by mouth daily 7)  Oxybutynin Chloride 10 Mg Xr24h-tab (Oxybutynin chloride) .Marland Kitchen.. 1 tab by mouth at bedtime--dr. willis 8)  Cymbalta 60 Mg Cpep (Duloxetine hcl) .Marland Kitchen.. 1 cap by mouth daily--dr. cram 9)  Ferrous Sulfate 325 (65 Fe) Mg Tabs (Ferrous sulfate) .Marland Kitchen.. 1 tab by mouth two times a day with 500 mg of vitamin c daily 10)  Vitamin C 500 Mg Chew (Ascorbic acid) .Marland Kitchen.. 1 chewtab by mouth two times a day with iron supplement 11)  Carbamazepine 200 Mg Tabs (Carbamazepine) .Marland Kitchen.. 1 tab by mouth two times a day --dr. Thana Farr spine radiculopathy 12)  Spiriva Handihaler 18 Mcg Caps (Tiotropium bromide monohydrate) .... Once daily 13)  Chantix 1 Mg Tabs (Varenicline tartrate) .Marland Kitchen.. 1 tab by mouth two times a day --begin when complete starter pack 14)  Symbicort 80-4.5 Mcg/act Aero (Budesonide-formoterol fumarate) .... 2 puffs twice daily 15)  Dulera 100-5 Mcg/act Aero (Mometasone furo-formoterol fum) .... Uad 16)  Dolophine 5 Mg Tabs (Methadone hcl) .... Uad 17)  Losartan Potassium 100 Mg Tabs (Losartan potassium) .... Take one tablet by mouth once daily.  Appended Document: ED f/u for recurrent N&V /tmm  Laboratory Results   Urine Tests    Routine Urinalysis   Glucose: negative    (Normal Range: Negative) Bilirubin: small   (Normal Range: Negative) Ketone: negative   (Normal Range: Negative) Spec. Gravity: 1.025   (Normal Range: 1.003-1.035) Blood: negative   (Normal Range: Negative) pH: 5.5   (Normal Range: 5.0-8.0) Protein: negative   (Normal Range: Negative) Urobilinogen: 1.0   (Normal Range: 0-1) Nitrite: negative   (Normal Range: Negative) Leukocyte Esterace: negative   (Normal Range: Negative)         Vital Signs:  Patient profile:   70 year old male Pulse (ortho):   64 / minute BP standing:   126 / 80   Serial Vital Signs/Assessments:  Time      Position  BP       Pulse  Resp  Temp     By 3:19 PM   Lying LA  156/96   60  Tiffany McCoy CMA 3:19 PM   Standing  126/80   64                    Tiffany McCoy CMA

## 2011-01-22 NOTE — Assessment & Plan Note (Signed)
Summary: hospital f/u/////rjp   Vital Signs:  Patient profile:   70 year old male Weight:      168 pounds Temp:     98.2 degrees F Pulse rate:   85 / minute Pulse rhythm:   regular Resp:     16 per minute BP sitting:   128 / 74  (left arm) Cuff size:   regular  Vitals Entered By: Vesta Mixer CMA (February 27, 2010 2:06 PM) CC: Hosp f/u for pneumonia Is Patient Diabetic? Yes Pain Assessment Patient in pain? yes     Location: arms Intensity: 9 CBG Result 137  Does patient need assistance? Ambulation Normal   Primary Care Provider:  Julieanne Manson MD  CC:  Hosp f/u for pneumonia.  History of Present Illness: 1.  CAP:  Treated for Right Lower lobe pneumonia and hospitalized from 3-2 to 3-5.  Took last dose of Azithromycin and Vantin about 4 days ago.  Still coughing a bit, but no production.  Still a bit dyspneic.  No fevers.  2.  Light headedness:  no orthostasis in hospital.  Was told to stop HCTZ and increase Cozaar.  Was also found to have mild to moderate MR with LAE on echo.  EF 55-60 %.  Pt. has continued the HCTZ, however.  Pt. has also continued on his 50 mg Cozaar despite having started the 100 mg Cozaar.  Mild dizziness from time to time since hospital discharge  3.  Anemia:  Hgb/Hct similar in hospital.  Pt. not sure if he has started iron.  May be taking once daily with Vitamin C.  Has had extensive GI work up in recent years.   4.  DM:   Discussed A1C at 6.6%.  Discussed complications and treatment.  Willing to see diabetic educator  Allergies (verified): No Known Drug Allergies  Physical Exam  General:  NAD Lungs:  Normal respiratory effort, chest expands symmetrically. Lungs are clear to auscultation, no crackles or wheezes. Heart:  Normal rate and regular rhythm. S1 and S2 normal without gallop, murmur, click, rub or other extra sounds.  Radial pulses normal and equal Extremities:  No edema   Impression & Recommendations:  Problem # 1:   PNEUMONIA, RIGHT LOWER LOBE (ICD-486) Hospitalized for 3 days.   Much improved.   Problem # 2:  MITRAL REGURGITATION, MILD TO MODERATE (ICD-396.3) Follow  Problem # 3:  DIABETES MELLITUS, TYPE II, CONTROLLED (ICD-250.00) negative microalbumin Diabetic educator for help with lifestyle changes. No meds for now--on many meds. Discussed foot care His updated medication list for this problem includes:    Losartan Potassium 100 Mg Tabs (Losartan potassium) .Marland Kitchen... 1 tab by mouth daily  Orders: Capillary Blood Glucose/CBG ([address])  Problem # 4:  HYPERTENSION (ICD-401.9) Controlled, but on 150 mg of Losartan and 25 mg of HCTZ currently Stop the 50 mg of Losartan and HCTZ as well. The following medications were removed from the medication list:    Hydrochlorothiazide 25 Mg Tabs (Hydrochlorothiazide) .Marland Kitchen... 1 tab by mouth daily His updated medication list for this problem includes:    Losartan Potassium 100 Mg Tabs (Losartan potassium) .Marland Kitchen... 1 tab by mouth daily  Problem # 5:  HYPERCHOLESTEROLEMIA, MIXED (ICD-272.0) Not at goal with new Dx of DM Increase Simvastatin to 80 mg daily His updated medication list for this problem includes:    Simvastatin 80 Mg Tabs (Simvastatin) .Marland Kitchen... 1 tab by mouth daily  Problem # 6:  DIZZINESS (ICD-780.4)  Stop HCTZ and take only 100 mg  of Losartan  Complete Medication List: 1)  Nexium 40 Mg Pack (Esomeprazole magnesium) .... Take 1 tablet by mouth two times a day 2)  Triamcinolone Acetonide 0.1 % Crea (Triamcinolone acetonide) .... Apply two times a day to affected area as needed 3)  Clobetasol Propionate 0.05 % Crea (Clobetasol propionate) .... Apply two times a day to affected area prn 4)  Vicodin 5-500 Mg Tabs (Hydrocodone-acetaminophen) .Marland Kitchen.. 1 tablet two times a day--dr. Anne Hahn 5)  Simvastatin 80 Mg Tabs (Simvastatin) .Marland Kitchen.. 1 tab by mouth daily 6)  Lumigan 0.03 % Soln (Bimatoprost) .Marland Kitchen.. 1 drop ou at bedtime.  dr. Mitzi Davenport. 7)  Losartan Potassium 100  Mg Tabs (Losartan potassium) .Marland Kitchen.. 1 tab by mouth daily 8)  Oxybutynin Chloride 10 Mg Xr24h-tab (Oxybutynin chloride) .Marland Kitchen.. 1 tab by mouth at bedtime--dr. willis 9)  Cymbalta 60 Mg Cpep (Duloxetine hcl) .Marland Kitchen.. 1 cap by mouth daily--dr. cram 10)  Ferrous Sulfate 325 (65 Fe) Mg Tabs (Ferrous sulfate) .Marland Kitchen.. 1 tab by mouth two times a day with 500 mg of vitamin c daily 11)  Vitamin C 500 Mg Chew (Ascorbic acid) .Marland Kitchen.. 1 chewtab by mouth two times a day with iron supplement 12)  Carbamazepine 200 Mg Tabs (Carbamazepine) .Marland Kitchen.. 1 tab by mouth two times a day --dr. Thana Farr spine radiculopathy  Patient Instructions: 1)  Referral to Susie Piper, diabetic educator 2)  FLP and liver enzymes, CBC in 2 months--lab only 3)  Follow up with Dr. Delrae Alfred in 4 months --DM Prescriptions: SIMVASTATIN 80 MG TABS (SIMVASTATIN) 1 tab by mouth daily  #30 x 11   Entered and Authorized by:   Julieanne Manson MD   Signed by:   Julieanne Manson MD on 02/27/2010   Method used:   Electronically to        [facility] [id number] (retail)       [address] [place], [place]  [address]       Ph: [phone]       Fax: [phone]   RxID:   [id number]

## 2011-01-31 ENCOUNTER — Encounter (INDEPENDENT_AMBULATORY_CARE_PROVIDER_SITE_OTHER): Payer: Self-pay | Admitting: Internal Medicine

## 2011-02-04 ENCOUNTER — Encounter (HOSPITAL_BASED_OUTPATIENT_CLINIC_OR_DEPARTMENT_OTHER)
Admission: RE | Admit: 2011-02-04 | Discharge: 2011-02-04 | Disposition: A | Discharge status: Home / Self Care | Payer: Medicare Other | Source: Ambulatory Visit | Attending: Specialist | Admitting: Specialist

## 2011-02-04 DIAGNOSIS — Z01812 Encounter for preprocedural laboratory examination: Secondary | ICD-10-CM | POA: Insufficient documentation

## 2011-02-04 LAB — DIFFERENTIAL
Basophils Relative: 0 % (ref 0–1)
Lymphocytes Relative: 16 % (ref 12–46)
Lymphs Abs: 1.7 10*3/uL (ref 0.7–4.0)
Monocytes Absolute: 0.5 10*3/uL (ref 0.1–1.0)
Monocytes Relative: 4 % (ref 3–12)
Neutro Abs: 8.3 10*3/uL — ABNORMAL HIGH (ref 1.7–7.7)
Neutrophils Relative %: 79 % — ABNORMAL HIGH (ref 43–77)

## 2011-02-04 LAB — CBC
HCT: 38.8 % — ABNORMAL LOW (ref 39.0–52.0)
Hemoglobin: 12.3 g/dL — ABNORMAL LOW (ref 13.0–17.0)
MCH: 25.3 pg — ABNORMAL LOW (ref 26.0–34.0)
MCHC: 31.7 g/dL (ref 30.0–36.0)
MCV: 79.7 fL (ref 78.0–100.0)
RBC: 4.87 MIL/uL (ref 4.22–5.81)

## 2011-02-04 LAB — BASIC METABOLIC PANEL
BUN: 12 mg/dL (ref 6–23)
CO2: 30 mEq/L (ref 19–32)
Calcium: 9.7 mg/dL (ref 8.4–10.5)
Chloride: 106 mEq/L (ref 96–112)
Creatinine, Ser: 0.95 mg/dL (ref 0.4–1.5)
Glucose, Bld: 127 mg/dL — ABNORMAL HIGH (ref 70–99)

## 2011-02-05 ENCOUNTER — Telehealth (INDEPENDENT_AMBULATORY_CARE_PROVIDER_SITE_OTHER): Payer: Self-pay | Admitting: Internal Medicine

## 2011-02-05 NOTE — Letter (Signed)
Summary: *HSN Results Follow up  [facility]  [address] [place], [place] [address]   Phone: [phone]  Fax: [phone]      01/31/2011   [place] [address] [place], [place]  [address]   Dear  Mr. JET ARMBRUST,                            ____S.Drinkard,FNP   ____D. Gore,FNP       ____B. McPherson,MD   ____V. Rankins,MD    __XE. Peytan Andringa,MD    ____N. Daphine Deutscher, FNP  ____D. Reche Dixon, MD    ____K. Philipp Deputy, MD    ____Other     This letter is to inform you that your recent test(s):  _______Pap Smear    ____X__Lab Test     _______X-ray    ___X____ is within acceptable limits  _______ requires a medication change  _______ requires a follow-up lab visit  _______ requires a follow-up visit with your provider   Comments:  Your sugar control is creeping up just a bit, but still at an okay level.       _________________________________________________________ If you have any questions, please contact our office                     Sincerely,  Julieanne Manson MD [facility]

## 2011-02-09 ENCOUNTER — Ambulatory Visit (HOSPITAL_BASED_OUTPATIENT_CLINIC_OR_DEPARTMENT_OTHER)
Admission: RE | Admit: 2011-02-09 | Discharge: 2011-02-09 | Disposition: A | Discharge status: Home / Self Care | Payer: Medicare Other | Source: Ambulatory Visit | Attending: Specialist | Admitting: Specialist

## 2011-02-09 ENCOUNTER — Other Ambulatory Visit: Payer: Self-pay | Admitting: Specialist

## 2011-02-09 DIAGNOSIS — J449 Chronic obstructive pulmonary disease, unspecified: Secondary | ICD-10-CM | POA: Insufficient documentation

## 2011-02-09 DIAGNOSIS — F329 Major depressive disorder, single episode, unspecified: Secondary | ICD-10-CM | POA: Insufficient documentation

## 2011-02-09 DIAGNOSIS — J4489 Other specified chronic obstructive pulmonary disease: Secondary | ICD-10-CM | POA: Insufficient documentation

## 2011-02-09 DIAGNOSIS — D1801 Hemangioma of skin and subcutaneous tissue: Secondary | ICD-10-CM | POA: Insufficient documentation

## 2011-02-09 DIAGNOSIS — F3289 Other specified depressive episodes: Secondary | ICD-10-CM | POA: Insufficient documentation

## 2011-02-09 DIAGNOSIS — F17200 Nicotine dependence, unspecified, uncomplicated: Secondary | ICD-10-CM | POA: Insufficient documentation

## 2011-02-09 HISTORY — PX: OTHER SURGICAL HISTORY: SHX169

## 2011-02-09 LAB — POCT HEMOGLOBIN-HEMACUE: Hemoglobin: 15.3 g/dL (ref 13.0–17.0)

## 2011-02-17 NOTE — Progress Notes (Signed)
Summary: Having eye surgery, needs BP check  Phone Note Call from Patient   Summary of Call: pt says he is suppose to have surgery on top of his left eye on Monday and the surgeon wants pt to get BP check Initial call taken by: [place] Shannon,  February 05, 2011 1:01 PM  Follow-up for Phone Call        Left message on answering machine for pt. to return call.  Dutch Quint RN  February 05, 2011 2:23 PM   Additional Follow-up for Phone Call Additional follow up Details #1::        Not clear why he needs bp check--was it running high at the eye doctor? Additional Follow-up by: Julieanne Manson MD,  February 11, 2011 12:20 PM    Additional Follow-up for Phone Call Additional follow up Details #2::    Pt. states he was told by someone here to have his BP checked at [facility].  He went to [facility] and BP was 150/87.  Had it checked when he had his surgery, doesn't remember what it was, but the surgery was done.  Is doing "pretty well."  Advised to call back as needed.  Dutch Quint RN  February 12, 2011 9:47 AM

## 2011-03-02 LAB — BASIC METABOLIC PANEL
BUN: 3 mg/dL — ABNORMAL LOW (ref 6–23)
BUN: 5 mg/dL — ABNORMAL LOW (ref 6–23)
BUN: 9 mg/dL (ref 6–23)
CO2: 23 mEq/L (ref 19–32)
CO2: 24 mEq/L (ref 19–32)
CO2: 25 mEq/L (ref 19–32)
Calcium: 8.2 mg/dL — ABNORMAL LOW (ref 8.4–10.5)
Calcium: 8.3 mg/dL — ABNORMAL LOW (ref 8.4–10.5)
Calcium: 9.3 mg/dL (ref 8.4–10.5)
Calcium: 9.4 mg/dL (ref 8.4–10.5)
Chloride: 107 mEq/L (ref 96–112)
Chloride: 107 mEq/L (ref 96–112)
Chloride: 109 mEq/L (ref 96–112)
Chloride: 99 mEq/L (ref 96–112)
Creatinine, Ser: 0.72 mg/dL (ref 0.4–1.5)
Creatinine, Ser: 0.8 mg/dL (ref 0.4–1.5)
Creatinine, Ser: 0.85 mg/dL (ref 0.4–1.5)
GFR calc Af Amer: >60 mL/min (ref >60)
GFR calc Af Amer: >60 mL/min (ref >60)
GFR calc non Af Amer: >60 mL/min (ref >60)
GFR calc non Af Amer: >60 mL/min (ref >60)
Glucose, Bld: 104 mg/dL — ABNORMAL HIGH (ref 70–99)
Glucose, Bld: 112 mg/dL — ABNORMAL HIGH (ref 70–99)
Potassium: 3.3 mEq/L — ABNORMAL LOW (ref 3.5–5.1)
Potassium: 4.6 mEq/L (ref 3.5–5.1)
Sodium: 138 mEq/L (ref 135–145)
Sodium: 140 mEq/L (ref 135–145)

## 2011-03-02 LAB — CARBAMAZEPINE LEVEL, TOTAL
Carbamazepine Lvl: 10.8 ug/mL (ref 4.0–12.0)
Carbamazepine Lvl: 11.6 ug/mL (ref 4.0–12.0)
Carbamazepine Lvl: 14.1 ug/mL — ABNORMAL HIGH (ref 4.0–12.0)
Carbamazepine Lvl: 18.1 ug/mL (ref 4.0–12.0)
Carbamazepine Lvl: 24.9 ug/mL (ref 4.0–12.0)
Carbamazepine Lvl: 9.4 ug/mL (ref 4.0–12.0)

## 2011-03-02 LAB — CBC
MCH: 25.1 pg — ABNORMAL LOW (ref 26.0–34.0)
MCV: 77.6 fL — ABNORMAL LOW (ref 78.0–100.0)
Platelets: 247 10*3/uL (ref 150–400)
RBC: 4.34 MIL/uL (ref 4.22–5.81)
RDW: 16.6 % — ABNORMAL HIGH (ref 11.5–15.5)

## 2011-03-02 LAB — DIFFERENTIAL
Basophils Absolute: 0.1 10*3/uL (ref 0.0–0.1)
Basophils Relative: 1 % (ref 0–1)
Eosinophils Absolute: 0.3 10*3/uL (ref 0.0–0.7)
Eosinophils Relative: 3 % (ref 0–5)
Lymphs Abs: 2.8 10*3/uL (ref 0.7–4.0)
Neutrophils Relative %: 58 % (ref 43–77)

## 2011-03-02 LAB — URINALYSIS, ROUTINE W REFLEX MICROSCOPIC
Glucose, UA: NEGATIVE mg/dL
Hgb urine dipstick: NEGATIVE
Ketones, ur: 15 mg/dL — AB
Protein, ur: NEGATIVE mg/dL
Urobilinogen, UA: 0.2 mg/dL (ref 0.0–1.0)

## 2011-03-02 LAB — APTT: aPTT: 28 seconds (ref 24–37)

## 2011-03-02 LAB — ETHANOL: Alcohol, Ethyl (B): <5 mg/dL (ref 0–10)

## 2011-03-02 LAB — MAGNESIUM: Magnesium: 2 mg/dL (ref 1.5–2.5)

## 2011-03-02 LAB — HEPATIC FUNCTION PANEL
ALT: 16 U/L (ref 0–53)
Alkaline Phosphatase: 67 U/L (ref 39–117)
Bilirubin, Direct: 0.1 mg/dL (ref 0.0–0.3)
Indirect Bilirubin: 0.1 mg/dL — ABNORMAL LOW (ref 0.3–0.9)
Total Bilirubin: 0.2 mg/dL — ABNORMAL LOW (ref 0.3–1.2)

## 2011-03-02 LAB — AMMONIA: Ammonia: 62 umol/L — ABNORMAL HIGH (ref 11–35)

## 2011-03-05 NOTE — Op Note (Signed)
  NAME:  [name], [name]:  [id number]  MEDICAL RECORD NO.:  [id number]           PATIENT TYPE:  LOCATION:                                 FACILITY:  PHYSICIAN:  Earvin Hansen L. Adetokunbo Mccadden, M.D.DATE OF BIRTH:  12-03-1941  DATE OF PROCEDURE:  02/09/2011 DATE OF DISCHARGE:                              OPERATIVE REPORT   This is a 70 year old gentleman with enlarging mass involving his left supraorbital area, increased growth, discomfort, measuring approximately 2 x 2.5-3 cm, increased pain, and discomfort.  PROCEDURES DONE:  Excision of mass, plaster reconstruction of the area.  ANESTHESIA:  General.  The patient underwent general anesthesia, intubated orally.  Prep was done to the face with Betadine solution, walled off with the sterile towels and drapes so as to make a sterile field.  A marking pen was used to outline the total area of the mass.  Local infiltration of Xylocaine with epinephrine 1%, 1:100,000 concentration was done with a total of 8 mL.  Curvilinear incision and elliptical incision was made over the mass which had a central pore included in that using a small double hooks and Stevens scissors.  I was able to delicately dissect out the mass around the surrounding tissue as well as at the periosteum using a Therapist, nutritional.  I was able to facilitate that in a beveled manner. Hemostasis was maintained with Bovie coagulation.  Protecting all neurovascular structures, the mass was removed in its entirety.  After proper hemostasis using the Bovie coagulation, the defect was then closed and an advancement flap designed with deep sutures of 3-0 Monocryl x2 layers and subdermal suture of 5-0 Monocryl, and then a running subcuticular stitch of 5-0 Monocryl.  Half-inch Steri-Strips and soft dressing were applied to all the areas and pressure dressing and Hypafix tape.  He withstood the procedures very well and was taken to recovery in excellent  condition.     Yaakov Guthrie. Shon Hough, M.D.     Cathie Hoops  D:  02/09/2011  T:  02/09/2011  Job:  [id number]  Electronically Signed by Russell [name] M.D. on 03/05/2011 09:04:15 AM

## 2011-03-07 LAB — CBC
HCT: 37.2 % — ABNORMAL LOW (ref 39.0–52.0)
Hemoglobin: 11.5 g/dL — ABNORMAL LOW (ref 13.0–17.0)
MCHC: 32.5 g/dL (ref 30.0–36.0)
Platelets: 304 10*3/uL (ref 150–400)
RBC: 4.45 MIL/uL (ref 4.22–5.81)
RDW: 16.7 % — ABNORMAL HIGH (ref 11.5–15.5)

## 2011-03-07 LAB — CARDIAC PANEL(CRET KIN+CKTOT+MB+TROPI)
CK, MB: 1.4 ng/mL (ref 0.3–4.0)
CK, MB: 1.4 ng/mL (ref 0.3–4.0)
Relative Index: 1 (ref 0.0–2.5)
Relative Index: 1.1 (ref 0.0–2.5)
Total CK: 130 U/L (ref 7–232)
Troponin I: 0.01 ng/mL (ref 0.00–0.06)

## 2011-03-07 LAB — TSH: TSH: 2.141 u[IU]/mL (ref 0.350–4.500)

## 2011-03-07 LAB — DIFFERENTIAL
Eosinophils Absolute: 0.1 10*3/uL (ref 0.0–0.7)
Lymphocytes Relative: 25 % (ref 12–46)
Lymphs Abs: 2.3 10*3/uL (ref 0.7–4.0)
Monocytes Relative: 8 % (ref 3–12)
Neutro Abs: 6 10*3/uL (ref 1.7–7.7)
Neutrophils Relative %: 66 % (ref 43–77)

## 2011-03-07 LAB — BASIC METABOLIC PANEL
Calcium: 8.8 mg/dL (ref 8.4–10.5)
Creatinine, Ser: 0.86 mg/dL (ref 0.4–1.5)
GFR calc Af Amer: >60 mL/min (ref >60)
GFR calc non Af Amer: >60 mL/min (ref >60)
Sodium: 140 mEq/L (ref 135–145)

## 2011-03-07 LAB — COMPREHENSIVE METABOLIC PANEL
ALT: 12 U/L (ref 0–53)
Calcium: 9.1 mg/dL (ref 8.4–10.5)
Creatinine, Ser: 0.97 mg/dL (ref 0.4–1.5)
Glucose, Bld: 89 mg/dL (ref 70–99)
Sodium: 140 mEq/L (ref 135–145)
Total Protein: 6.3 g/dL (ref 6.0–8.3)

## 2011-03-08 LAB — CBC
HCT: 41.1 % (ref 39.0–52.0)
HCT: 38.7 % — ABNORMAL LOW (ref 39.0–52.0)
Hemoglobin: 12.9 g/dL — ABNORMAL LOW (ref 13.0–17.0)
Hemoglobin: 13 g/dL (ref 13.0–17.0)
Hemoglobin: 13.4 g/dL (ref 13.0–17.0)
MCH: 26 pg (ref 26.0–34.0)
MCH: 25.8 pg — ABNORMAL LOW (ref 26.0–34.0)
MCHC: 32.8 g/dL (ref 30.0–36.0)
MCHC: 33.1 g/dL (ref 30.0–36.0)
MCV: 79.4 fL (ref 78.0–100.0)
MCV: 78.1 fL (ref 78.0–100.0)
Platelets: 331 10*3/uL (ref 150–400)
Platelets: 342 10*3/uL (ref 150–400)
Platelets: 312 10*3/uL (ref 150–400)
RBC: 5.17 MIL/uL (ref 4.22–5.81)
RBC: 4.96 MIL/uL (ref 4.22–5.81)
RDW: 17.1 % — ABNORMAL HIGH (ref 11.5–15.5)
WBC: 8.8 10*3/uL (ref 4.0–10.5)
WBC: 9.3 10*3/uL (ref 4.0–10.5)

## 2011-03-08 LAB — URINALYSIS, ROUTINE W REFLEX MICROSCOPIC
Bilirubin Urine: NEGATIVE
Bilirubin Urine: NEGATIVE
Bilirubin Urine: NEGATIVE
Bilirubin Urine: NEGATIVE
Glucose, UA: NEGATIVE mg/dL
Glucose, UA: NEGATIVE mg/dL
Hgb urine dipstick: NEGATIVE
Ketones, ur: 15 mg/dL — AB
Ketones, ur: NEGATIVE mg/dL
Nitrite: NEGATIVE
Nitrite: NEGATIVE
Nitrite: NEGATIVE
Nitrite: NEGATIVE
Protein, ur: NEGATIVE mg/dL
Specific Gravity, Urine: 1.017 (ref 1.005–1.030)
Specific Gravity, Urine: 1.011 (ref 1.005–1.030)
Specific Gravity, Urine: 1.02 (ref 1.005–1.030)
Specific Gravity, Urine: 1.025 (ref 1.005–1.030)
Urobilinogen, UA: 0.2 mg/dL (ref 0.0–1.0)
pH: 6 (ref 5.0–8.0)
pH: 6.5 (ref 5.0–8.0)
pH: 5.5 (ref 5.0–8.0)
pH: 6 (ref 5.0–8.0)
pH: 7 (ref 5.0–8.0)

## 2011-03-08 LAB — COMPREHENSIVE METABOLIC PANEL
ALT: 13 U/L (ref 0–53)
ALT: 24 U/L (ref 0–53)
ALT: 25 U/L (ref 0–53)
AST: 13 U/L (ref 0–37)
Albumin: 3.9 g/dL (ref 3.5–5.2)
Albumin: 3.8 g/dL (ref 3.5–5.2)
Albumin: 4.1 g/dL (ref 3.5–5.2)
Alkaline Phosphatase: 73 U/L (ref 39–117)
Alkaline Phosphatase: 76 U/L (ref 39–117)
Alkaline Phosphatase: 77 U/L (ref 39–117)
BUN: 19 mg/dL (ref 6–23)
BUN: 14 mg/dL (ref 6–23)
CO2: 26 mEq/L (ref 19–32)
CO2: 30 mEq/L (ref 19–32)
Calcium: 10.1 mg/dL (ref 8.4–10.5)
Chloride: 102 mEq/L (ref 96–112)
Chloride: 104 mEq/L (ref 96–112)
Chloride: 105 mEq/L (ref 96–112)
Creatinine, Ser: 1.01 mg/dL (ref 0.4–1.5)
Creatinine, Ser: 1.02 mg/dL (ref 0.4–1.5)
GFR calc Af Amer: >60 mL/min (ref >60)
GFR calc non Af Amer: >60 mL/min (ref >60)
GFR calc non Af Amer: >60 mL/min (ref >60)
GFR calc non Af Amer: >60 mL/min (ref >60)
Glucose, Bld: 114 mg/dL — ABNORMAL HIGH (ref 70–99)
Glucose, Bld: 122 mg/dL — ABNORMAL HIGH (ref 70–99)
Potassium: 4 mEq/L (ref 3.5–5.1)
Potassium: 4.5 mEq/L (ref 3.5–5.1)
Potassium: 4 mEq/L (ref 3.5–5.1)
Sodium: 139 mEq/L (ref 135–145)
Sodium: 142 mEq/L (ref 135–145)
Sodium: 151 mEq/L — ABNORMAL HIGH (ref 135–145)
Total Bilirubin: 0.7 mg/dL (ref 0.3–1.2)
Total Bilirubin: 0.3 mg/dL (ref 0.3–1.2)
Total Bilirubin: 0.3 mg/dL (ref 0.3–1.2)
Total Protein: 7.3 g/dL (ref 6.0–8.3)

## 2011-03-08 LAB — DIFFERENTIAL
Basophils Absolute: 0 10*3/uL (ref 0.0–0.1)
Basophils Absolute: 0 10*3/uL (ref 0.0–0.1)
Basophils Relative: 0 % (ref 0–1)
Basophils Relative: 0 % (ref 0–1)
Basophils Relative: 0 % (ref 0–1)
Eosinophils Absolute: 0.1 10*3/uL (ref 0.0–0.7)
Eosinophils Absolute: 0.1 10*3/uL (ref 0.0–0.7)
Eosinophils Absolute: 0.1 10*3/uL (ref 0.0–0.7)
Eosinophils Relative: 1 % (ref 0–5)
Eosinophils Relative: 1 % (ref 0–5)
Eosinophils Relative: 1 % (ref 0–5)
Lymphocytes Relative: 22 % (ref 12–46)
Lymphocytes Relative: 18 % (ref 12–46)
Lymphs Abs: 1.9 10*3/uL (ref 0.7–4.0)
Monocytes Absolute: 0.3 10*3/uL (ref 0.1–1.0)
Monocytes Absolute: 0.6 10*3/uL (ref 0.1–1.0)
Monocytes Relative: 6 % (ref 3–12)
Neutro Abs: 6.2 10*3/uL (ref 1.7–7.7)
Neutro Abs: 6.2 10*3/uL (ref 1.7–7.7)
Neutrophils Relative %: 70 % (ref 43–77)
Neutrophils Relative %: 79 % — ABNORMAL HIGH (ref 43–77)

## 2011-03-08 LAB — BASIC METABOLIC PANEL
BUN: 24 mg/dL — ABNORMAL HIGH (ref 6–23)
CO2: 27 mEq/L (ref 19–32)
Calcium: 8.9 mg/dL (ref 8.4–10.5)
Chloride: 100 mEq/L (ref 96–112)
Creatinine, Ser: 1.04 mg/dL (ref 0.4–1.5)
GFR calc Af Amer: >60 mL/min (ref >60)
GFR calc non Af Amer: >60 mL/min (ref >60)
Glucose, Bld: 109 mg/dL — ABNORMAL HIGH (ref 70–99)
Potassium: 3.7 mEq/L (ref 3.5–5.1)
Potassium: 4.6 mEq/L (ref 3.5–5.1)
Sodium: 139 mEq/L (ref 135–145)

## 2011-03-08 LAB — URINE MICROSCOPIC-ADD ON

## 2011-03-08 LAB — POCT CARDIAC MARKERS
CKMB, poc: <1 ng/mL — ABNORMAL LOW (ref 1.0–8.0)
Troponin i, poc: <0.05 ng/mL (ref 0.00–0.09)

## 2011-03-08 LAB — URINE CULTURE
Colony Count: NO GROWTH
Culture: NO GROWTH

## 2011-03-08 LAB — CK TOTAL AND CKMB (NOT AT ARMC)
CK, MB: 1.2 ng/mL (ref 0.3–4.0)
Relative Index: 1.1 (ref 0.0–2.5)
Total CK: 114 U/L (ref 7–232)
Total CK: 77 U/L (ref 7–232)

## 2011-03-08 LAB — POCT I-STAT, CHEM 8
Chloride: 105 mEq/L (ref 96–112)
Glucose, Bld: 108 mg/dL — ABNORMAL HIGH (ref 70–99)
HCT: 42 % (ref 39.0–52.0)
Hemoglobin: 14.3 g/dL (ref 13.0–17.0)
Potassium: 4.2 mEq/L (ref 3.5–5.1)
Sodium: 140 mEq/L (ref 135–145)

## 2011-03-08 LAB — LIPASE, BLOOD
Lipase: 22 U/L (ref 11–59)
Lipase: 22 U/L (ref 11–59)
Lipase: 23 U/L (ref 11–59)

## 2011-03-08 LAB — TROPONIN I: Troponin I: 0.01 ng/mL (ref 0.00–0.06)

## 2011-03-08 LAB — POCT HEMOGLOBIN-HEMACUE: Hemoglobin: 12.5 g/dL — ABNORMAL LOW (ref 13.0–17.0)

## 2011-03-13 LAB — CBC
HCT: 30.1 % — ABNORMAL LOW (ref 39.0–52.0)
HCT: 30.1 % — ABNORMAL LOW (ref 39.0–52.0)
Hemoglobin: 10 g/dL — ABNORMAL LOW (ref 13.0–17.0)
Hemoglobin: 10.9 g/dL — ABNORMAL LOW (ref 13.0–17.0)
MCHC: 32.9 g/dL (ref 30.0–36.0)
MCHC: 33.3 g/dL (ref 30.0–36.0)
MCV: 76.3 fL — ABNORMAL LOW (ref 78.0–100.0)
MCV: 76.6 fL — ABNORMAL LOW (ref 78.0–100.0)
MCV: 77 fL — ABNORMAL LOW (ref 78.0–100.0)
Platelets: 365 10*3/uL (ref 150–400)
RBC: 3.95 MIL/uL — ABNORMAL LOW (ref 4.22–5.81)
RBC: 4.31 MIL/uL (ref 4.22–5.81)
RDW: 18.6 % — ABNORMAL HIGH (ref 11.5–15.5)
WBC: 8.4 10*3/uL (ref 4.0–10.5)
WBC: 8.4 10*3/uL (ref 4.0–10.5)

## 2011-03-13 LAB — HEMOCCULT GUIAC POC 1CARD (OFFICE): Fecal Occult Bld: NEGATIVE

## 2011-03-13 LAB — LIPID PANEL
Cholesterol: 169 mg/dL (ref 0–200)
LDL Cholesterol: 112 mg/dL — ABNORMAL HIGH (ref 0–99)
Total CHOL/HDL Ratio: 3.6 RATIO

## 2011-03-13 LAB — IRON AND TIBC
Saturation Ratios: 23 % (ref 20–55)
TIBC: 372 ug/dL (ref 215–435)

## 2011-03-13 LAB — DIFFERENTIAL
Basophils Relative: 1 % (ref 0–1)
Eosinophils Absolute: 0.1 10*3/uL (ref 0.0–0.7)
Eosinophils Relative: 1 % (ref 0–5)
Monocytes Absolute: 0.4 10*3/uL (ref 0.1–1.0)
Monocytes Relative: 5 % (ref 3–12)
Neutrophils Relative %: 77 % (ref 43–77)

## 2011-03-13 LAB — URINALYSIS, ROUTINE W REFLEX MICROSCOPIC
Bilirubin Urine: NEGATIVE
Hgb urine dipstick: NEGATIVE
Ketones, ur: NEGATIVE mg/dL
Specific Gravity, Urine: >1.046 — ABNORMAL HIGH (ref 1.005–1.030)
pH: 5.5 (ref 5.0–8.0)

## 2011-03-13 LAB — BASIC METABOLIC PANEL
BUN: 16 mg/dL (ref 6–23)
Calcium: 8.6 mg/dL (ref 8.4–10.5)
Creatinine, Ser: 0.91 mg/dL (ref 0.4–1.5)
GFR calc Af Amer: >60 mL/min (ref >60)
GFR calc non Af Amer: >60 mL/min (ref >60)

## 2011-03-13 LAB — COMPREHENSIVE METABOLIC PANEL
ALT: 17 U/L (ref 0–53)
AST: 12 U/L (ref 0–37)
AST: 14 U/L (ref 0–37)
Alkaline Phosphatase: 67 U/L (ref 39–117)
Alkaline Phosphatase: 75 U/L (ref 39–117)
BUN: 20 mg/dL (ref 6–23)
CO2: 28 mEq/L (ref 19–32)
Chloride: 102 mEq/L (ref 96–112)
Creatinine, Ser: 0.9 mg/dL (ref 0.4–1.5)
GFR calc non Af Amer: >60 mL/min (ref >60)
Glucose, Bld: 121 mg/dL — ABNORMAL HIGH (ref 70–99)
Potassium: 3.4 mEq/L — ABNORMAL LOW (ref 3.5–5.1)
Potassium: 4.1 mEq/L (ref 3.5–5.1)
Sodium: 142 mEq/L (ref 135–145)
Total Bilirubin: 0.5 mg/dL (ref 0.3–1.2)
Total Protein: 7.3 g/dL (ref 6.0–8.3)

## 2011-03-13 LAB — CULTURE, BLOOD (ROUTINE X 2): Culture: NO GROWTH

## 2011-03-13 LAB — FOLATE: Folate: >20 ng/mL

## 2011-03-13 LAB — VITAMIN B12: Vitamin B-12: 624 pg/mL (ref 211–911)

## 2011-03-13 LAB — RETICULOCYTES
Retic Count, Absolute: 33 10*3/uL (ref 19.0–186.0)
Retic Ct Pct: 0.8 % (ref 0.4–3.1)

## 2011-03-13 LAB — TSH: TSH: 1.14 u[IU]/mL (ref 0.350–4.500)

## 2011-03-13 LAB — CULTURE, RESPIRATORY W GRAM STAIN

## 2011-03-13 LAB — EXPECTORATED SPUTUM ASSESSMENT W GRAM STAIN, RFLX TO RESP C

## 2011-03-26 LAB — BASIC METABOLIC PANEL
BUN: 16 mg/dL (ref 6–23)
Calcium: 10 mg/dL (ref 8.4–10.5)
Chloride: 105 mEq/L (ref 96–112)
Creatinine, Ser: 1 mg/dL (ref 0.4–1.5)
GFR calc Af Amer: >60 mL/min (ref >60)
GFR calc non Af Amer: >60 mL/min (ref >60)

## 2011-03-26 LAB — CBC
MCV: 80.9 fL (ref 78.0–100.0)
Platelets: 351 10*3/uL (ref 150–400)
RBC: 4.28 MIL/uL (ref 4.22–5.81)
WBC: 9.1 10*3/uL (ref 4.0–10.5)

## 2011-04-01 LAB — CBC
HCT: 32.5 % — ABNORMAL LOW (ref 39.0–52.0)
HCT: 35.5 % — ABNORMAL LOW (ref 39.0–52.0)
Hemoglobin: 11.9 g/dL — ABNORMAL LOW (ref 13.0–17.0)
MCV: 81.7 fL (ref 78.0–100.0)
Platelets: 284 10*3/uL (ref 150–400)
Platelets: 304 10*3/uL (ref 150–400)
RBC: 3.98 MIL/uL — ABNORMAL LOW (ref 4.22–5.81)
RDW: 16.2 % — ABNORMAL HIGH (ref 11.5–15.5)
RDW: 16.6 % — ABNORMAL HIGH (ref 11.5–15.5)
WBC: 10.5 10*3/uL (ref 4.0–10.5)
WBC: 16.3 10*3/uL — ABNORMAL HIGH (ref 4.0–10.5)
WBC: 9.8 10*3/uL (ref 4.0–10.5)

## 2011-04-01 LAB — DIFFERENTIAL
Basophils Absolute: 0 10*3/uL (ref 0.0–0.1)
Basophils Relative: 0 % (ref 0–1)
Eosinophils Absolute: 0 10*3/uL (ref 0.0–0.7)
Monocytes Absolute: 0 10*3/uL — ABNORMAL LOW (ref 0.1–1.0)
Monocytes Relative: 0 % — ABNORMAL LOW (ref 3–12)
Neutrophils Relative %: 94 % — ABNORMAL HIGH (ref 43–77)

## 2011-04-01 LAB — COMPREHENSIVE METABOLIC PANEL
ALT: 16 U/L (ref 0–53)
AST: 14 U/L (ref 0–37)
Albumin: 3.3 g/dL — ABNORMAL LOW (ref 3.5–5.2)
Albumin: 3.6 g/dL (ref 3.5–5.2)
Alkaline Phosphatase: 58 U/L (ref 39–117)
Alkaline Phosphatase: 63 U/L (ref 39–117)
BUN: 15 mg/dL (ref 6–23)
CO2: 28 mEq/L (ref 19–32)
Chloride: 102 mEq/L (ref 96–112)
Chloride: 104 mEq/L (ref 96–112)
GFR calc Af Amer: >60 mL/min (ref >60)
GFR calc non Af Amer: >60 mL/min (ref >60)
Glucose, Bld: 150 mg/dL — ABNORMAL HIGH (ref 70–99)
Potassium: 3.6 mEq/L (ref 3.5–5.1)
Potassium: 4.3 mEq/L (ref 3.5–5.1)
Sodium: 139 mEq/L (ref 135–145)
Total Bilirubin: 0.3 mg/dL (ref 0.3–1.2)
Total Bilirubin: 0.3 mg/dL (ref 0.3–1.2)
Total Protein: 7 g/dL (ref 6.0–8.3)

## 2011-04-01 LAB — BASIC METABOLIC PANEL
BUN: 16 mg/dL (ref 6–23)
Calcium: 8.9 mg/dL (ref 8.4–10.5)
GFR calc non Af Amer: >60 mL/min (ref >60)
Glucose, Bld: 136 mg/dL — ABNORMAL HIGH (ref 70–99)

## 2011-04-07 LAB — POCT I-STAT, CHEM 8
BUN: 14 mg/dL (ref 6–23)
Calcium, Ion: 1.07 mmol/L — ABNORMAL LOW (ref 1.12–1.32)
TCO2: 24 mmol/L (ref 0–100)

## 2011-04-07 LAB — POCT CARDIAC MARKERS
Myoglobin, poc: 70.8 ng/mL (ref 12–200)
Troponin i, poc: <0.05 ng/mL (ref 0.00–0.09)

## 2011-04-07 LAB — DIFFERENTIAL
Basophils Absolute: 0.1 10*3/uL (ref 0.0–0.1)
Eosinophils Absolute: 0.1 10*3/uL (ref 0.0–0.7)
Eosinophils Relative: 1 % (ref 0–5)
Lymphocytes Relative: 15 % (ref 12–46)
Monocytes Absolute: 0.5 10*3/uL (ref 0.1–1.0)

## 2011-04-07 LAB — CBC
HCT: 37 % — ABNORMAL LOW (ref 39.0–52.0)
Hemoglobin: 12.5 g/dL — ABNORMAL LOW (ref 13.0–17.0)
MCHC: 33.7 g/dL (ref 30.0–36.0)
MCV: 82.8 fL (ref 78.0–100.0)
RDW: 17.7 % — ABNORMAL HIGH (ref 11.5–15.5)

## 2011-04-07 LAB — RAPID URINE DRUG SCREEN, HOSP PERFORMED
Barbiturates: NOT DETECTED
Cocaine: NOT DETECTED
Opiates: POSITIVE — AB

## 2011-05-05 NOTE — Discharge Summary (Signed)
NAME:  Russell [name], Russell [name] NO.:  [id number]   MEDICAL RECORD NO.:  [id number]          PATIENT TYPE:  INP   LOCATION:  5128                         FACILITY:  [facility]   PHYSICIAN:  Nestor Ramp, MD        DATE OF BIRTH:  Sep 13, 1941   DATE OF ADMISSION:  04/17/2009  DATE OF DISCHARGE:  04/19/2009                               DISCHARGE SUMMARY   DISCHARGE DIAGNOSES:  1. Left L5 radiculopathy.  2. Left S1 radiculopathy.  3. Hypertension.  4. Gastroesophageal reflux disease.  5. Hyperlipidemia.  6. Arthritis.  7. Cervical radiculopathy.   DISCHARGE MEDICATIONS:  1. Nexium 40 mg p.o. daily.  2. Hydrochlorothiazide 25 mg p.o. day.  3. Ferrous sulfate 325 mg p.o. daily.  4. Dilaudid 1 mg p.o. q.4 h. p.r.n. pain.  5. Acetaminophen 500 mg q. p.o. q.4 h. p.r.n. pain.  6. Ibuprofen 800 mg p.o. q.8 h. p.r.n. pain.  7. Neurontin 600 mg p.o. t.i.d.  8. Nortriptyline 50 mg 2 capsules p.o. at bedtime.  9. Cozaar 50 mg p.o. daily.  10.Lumigan eye drops as directed by primary care physician at bedtime.  11.Triamcinolone cream as needed for affected areas of skin.  12.Viagra p.r.n. 50 mg tabs up to 1 tablet per day.  13.Simvastatin 40 mg p.o. daily.  14.Prednisone 40 mg p.o. daily x5 days.  15.MiraLax 17 g p.o. daily p.r.n. constipation.  16.Colace 100 mg p.o. b.i.d. p.r.n. constipation.   DISCONTINUED MEDICATIONS:  1. Vicodin.  2. Lortab.  3. Tramadol.   CONSULTS:  Donalee Citrin, MD, Neurosurgery   LABORATORIES:  The patient's labs on admission were as follows; white  blood cells 9.8, hemoglobin 11.0, hematocrit 32.5, and platelets 304.  Sodium 139, potassium 3.6, chloride 104, bicarb 28, glucose 173, BUN 15,  and creatinine 0.96.  LFTs within normal limits except for albumin of  3.3.   The patient's labs on discharge are as follows; white blood cells 16.3,  hemoglobin 10.2, hematocrit 30.1, and platelets 284.  Sodium 137,  potassium 4.1, chloride 102, bicarb 29, BUN 16,  creatinine 1.19, glucose  136, and calcium 8.9.   STUDIES:  MRI, lumbar spine without contrast on April 17, 2009, shows at  L4-L5, there is mild spinal stenosis and impingement of the left L5  nerve root in the lateral recess.  Left paracentral disk protrusion at  L5-S1 with associated facet hypertrophy.  There is left S1 impingement.   BRIEF HOSPITAL COURSE:  This is a 70 year old male past medical history  significant for arthritis; hypertension; hyperlipidemia; GERD; cervical  radiculopathy who presents with acute onset left hip pain; left  posterior leg pain, which has progressed to the point that the patient  is unable to walk upon admission problem.  1. Lumbosacral radiculopathy.  The patient is admitted for pain      control and Neurosurgery consult.  Per neurosurgery, the given      above MRI findings, plan for inpatient pain control is appropriate.      Per Neurosurgery consultation, given the degree of facet      arthropathy, any surgery with this  patient will be relatively      extensive probably necessitating a 2-level fusion.  Given that the      patient has strength in his lower extremities, Neurosurgery      recommended more conservative treatment.  The patient was started      on oral prednisone as well as Dilaudid initially IV transitioned to      p.o. as well as scheduled acetaminophen and ibuprofen for pain      control.  The patient's pain had improved prior to discharge.  Per      Neurosurgery, the patient may be a candidate for epidural steroid      injection in the outpatient setting and will be evaluated for this      in the outpatient setting by Neurosurgery.  The patient is seen by      Dr. Lesia Sago for neuropathy.  The patient was seen by physical      therapy and evaluated.  The patient will be discharged with walker      to assist with ambulation.  The patient will also be discharged      with recommendation for outpatient physical therapy.  Note that  on      exam, the patient had positive straight leg raise at about 30      degrees with absent ankle jerks bilaterally.  Sensation appeared to      be decreased an S1 nerve root pattern on the left leg.  There were      no focal motor deficits, except for, but was limited by the      patient's pain in left lower extremity.   1. Hypertension.  The patient with some confusion regarding his home      medications.  The patient will be discharged on HCTZ and Cozaar      blood pressure control as it seems that these were the last      medications he was taking at [facility].  These medications may be      changed at the patient's primary care physician's discretion.  The      patient was normotensive for course of hospitalization.   1. GERD.  The patient was started on Protonix, will be switched to      Nexium for discharge.  Problem was stable for the patient during      course of hospitalization.   1. Hyperlipidemia.  The patient was started on Zocor during course of      hospitalization and will be discharged on simvastatin 40 mg  p.o. daily, this problem was stable for the patient during course of his  hospitalization.   1. Arthritis.  The patient with findings as above, started on      scheduled acetaminophen, ibuprofen as well as Dilaudid for pain      control.  The patient will be discharged on these medications as      such prior to being seen in the outpatient setting with      Neurosurgery.   FOLLOWUP:  The patient will follow up with Dr. Wynetta Emery at [facility], phone number [phone], on Thursday, May 05, 2007, at 10:45  a.m.  The patient will follow up with his primary care physician at  [facility] as  needed.  The patient was discharged to home in stable and improved  condition.  Given instructions to increase activity slowly as well as to  use walker for ambulation.  The patient was given instructions regarding  seeing physical therapy in the outpatient  setting.      Bobby Rumpf, MD  Electronically Signed      Nestor Ramp, MD  Electronically Signed    KC/MEDQ  D:  04/22/2009  T:  04/22/2009  Job:  [id number]

## 2011-05-05 NOTE — Op Note (Signed)
NAME:  Russell [name], Russell [name] NO.:  [id number]   MEDICAL RECORD NO.:  [id number]          PATIENT TYPE:  AMB   LOCATION:  SDS                          FACILITY:  [facility]   PHYSICIAN:  Salley Scarlet., M.D.DATE OF BIRTH:  1940-12-26   DATE OF PROCEDURE:  DATE OF DISCHARGE:  03/22/2008                               OPERATIVE REPORT   PREOPERATIVE DIAGNOSES:  Immature cataract left eye.   POSTOPERATIVE DIAGNOSES:  Immature cataract left eye.   OPERATIONS:  Kelman phacoemulsification cataract left eye.   ANESTHESIA:  Local using Xylocaine 2% with Marcaine 0.75% and Wydase.   JUSTIFICATION FOR PROCEDURE:  This is a 70 year old diabetic gentleman  who complains of blurring of vision with difficulty seeing to read.  He  also has a history of glaucoma which is presently controlled with  medication.  He was evaluated and found to have a visual acuity best  corrected 20/50 on the right and 20/400 on the left.  He was found to  have bilateral immature cataracts, slightly worse on the left than the  right.  Cataract extraction with intraocular lens implantation was  recommended.  He is admitted at this time for that purpose.   DESCRIPTION OF PROCEDURE:  Under the influence of IV sedation and Van  Link akinesia, retrobulbar anesthesia was given.  The patient was  prepped and draped in the usual manner.  The lid speculum was inserted  under the upper lower lid of the left eye and a 4-0 silk traction suture  was passed through the belly of the superior rectus muscle retraction.  A fornix-based conjunctival flap was turned and hemostasis achieved by  using cautery.  A groove incision was made at the limbus.  This incision  was dissected down to clear cornea using a crescent blade.  A sideport  incision made at the 1:30 o'clock position.  Ocucoat was injected into  the eye through the sideport incision.  The anterior chamber was entered  through the corneoscleral tunnel incision  at the 11:30 o'clock position.  An anterior capsulotomy was then performed using a bent 25 gauge needle.  The nucleus was hydrodissected using Xylocaine.  The KPE handpiece was  passed into the eye and the nucleus was emulsified without difficulty.  Residual cortical material was aspirated.  The posterior capsule was  polished using an olive tip polisher.  The wound was widened slightly to  accommodate a foldable silicone lens.  The lens was seated into the eye  behind the iris without difficulty.  The anterior chamber was reformed  and the pupil was constricted using Miochol.  The lips of the wound were  hydrated and tested to make sure there was no leak.  After ascertaining  that there was no leak, the conjunctiva was closed over wound using  thermal cautery.  One mL of Celestone and 0.5 mL of gentamicin were  injected subconjunctivally.  Maxitrol ophthalmic ointment and prilocaine  ointment were applied along with a patch and Fox shield.  The patient  tolerated the procedure well and was discharged to post anesthesia  recovery in satisfactory  condition.  He is instructed to rest today, to take Vicodin every 4  hours as needed for pain and to see me in office tomorrow for further  evaluation.   DISCHARGE DIAGNOSIS:  Immature cataract left eye.      Salley Scarlet., M.D.  Electronically Signed     TB/MEDQ  D:  03/24/2008  T:  03/24/2008  Job:  [id number]

## 2011-05-05 NOTE — Consult Note (Signed)
NAME:  Russell [name], Russell [name] NO.:  [id number]   MEDICAL RECORD NO.:  [id number]          PATIENT TYPE:  INP   LOCATION:  5128                         FACILITY:  [facility]   PHYSICIAN:  Donalee Citrin, M.D.        DATE OF BIRTH:  [birth year]   DATE OF CONSULTATION:  04/18/2009  DATE OF DISCHARGE:                                 CONSULTATION   REASON FOR CONSULTATION:  Left hip and leg pain.   REFERRING PHYSICIAN:  Dr. Wallene Huh.   HISTORY OF PRESENT ILLNESS:  The patient is a 70 year old gentleman who  has had 3 or 4 months of predominant left hip and leg pain that radiates  down from his left hip down to back of his leg to the outside of his  left foot involving the 3 toes between his big toe and his little toe.  Denies any pain in the right upper and lower extremities.  Denies any  bowel or bladder difficulty.  He denies any midline back pain.  He also  denies any neck pain, but does have some pain that goes into both  biceps.  He has noticed some muscle mass loss in his upper extremities  and some numbness in his hands and he has had a history of carpal tunnel  per his report.  The patient was admitted to the emergency department  last night to medicine for pain control, was seen by his medical folks  and MRI scan obtained showing stenosis we have been consulted.   PAST MEDICAL HISTORY:  Remarkable for hypertension, hyperlipidemia,  arthritis, gastroesophageal reflux disease, and cervical spondylosis  with neuropathy.  He has had bilateral eye surgeries, knee surgery.  He  is currently managed on gabapentin for pain and then his  antihypertensives.   PHYSICAL EXAMINATION:  GENERAL:  The patient is a very pleasant 67-year-  old gentleman in no acute distress.  HEENT:  Within normal limits.  NECK:  Has full range of motion.  No pain.  EXTREMITIES:  Upper extremities strength 5/5 although, he does have some  wasting in his biceps as well as in his hand intrinsics, lower  extremity  strength is 5/5 in his iliopsoas, quads, hamstrings, gastrocs and  __________.  There is no focal motor deficits except for what is limited  by pain in his left lower extremity.  He has positive straight leg raise  at about 30 degrees with absent ankle jerks bilaterally.  Sensation  appears to be decreased and predominantly what appears to be an S1 nerve  root pattern on the left leg.  His MRI scan does show marked spondylosis  in his lumbar spine at L4-5 and L5-S1, more severe foraminal stenosis at  both 4 and the 1 root.  Predominantly, on the left, he also has fluid  within the left side of facet joint.  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  Clearly this is the source of his impingement, I think he  has predominantly an S1 radicular pattern to his compression.  He is  already on prednisone.  He feels a little bit  better, I think he  finished the Prednisone, I think it should be considered for an epidural  steroid injection at L5-S1.  I also think we should initiate some  physical therapy.  He is already on muscle relaxants and Dilaudid for  pain.  Clearly, with his degree of facet arthropathy, diastasis of the  facet, fluid in the facet fluid, any surgery with him would be  relatively extensive, probably necessitating a 2-level fusion.  So we  will try and keep that as a last resort.  He has strength in his lower  extremities.  I think we need to maximize more of his conservative  treatment in his legs.  For physical therapy, oral prednisone, consider  epidural steroid injection and we will continue to follow up.  So in  addition with his neck pain, he has been followed by Dr. Lesia Sago,  may be for  his neuropathy, I am not really sure why but that may be worthwhile  considering a cervical spine MRI scan unless he has already had one and  I would defer to Dr. Clarisa Kindred office notes, we will try to obtain some  of that information of whether he has had one in  the past.           ______________________________  Donalee Citrin, M.D.     GC/MEDQ  D:  04/18/2009  T:  04/19/2009  Job:  [id number]

## 2011-05-05 NOTE — H&P (Signed)
NAME:  Russell [name], PORE NO.:  [id number]   MEDICAL RECORD NO.:  [id number]          PATIENT TYPE:  INP   LOCATION:  5128                         FACILITY:  [facility]   PHYSICIAN:  Nestor Ramp, MD        DATE OF BIRTH:  1941/05/17   DATE OF ADMISSION:  04/17/2009  DATE OF DISCHARGE:                              HISTORY & PHYSICAL   PRIMARY CARE PHYSICIANS:  1. Julieanne Manson, MD, [facility]  2. C. Lesia Sago, MD, [facility]   CHIEF COMPLAINT:  Left leg pain, left hip pain.   HISTORY OF PRESENT ILLNESS:  This is a 70 year old male with past  medical history significant for arthritis, hypertension, hyperlipemia,  GERD who presents with acute onset left hip, left posterior leg pain  which progressively worsened to the point that he is unable to walk.  This pain started on the Sunday prior to admission.  The patient denies  paralysis, saddle paresthesia, bladder, bowel incontinence, fever, or  chills.  Reports a longstanding left foot numbness prior to this  episode, also reports longstanding bilateral upper extremity burning  pain.  Denies falls, trauma, or history of fracture.  Pain in the hip is  aching, the patient also has shooting pain down the left posterior leg.  Denies recent weight loss but has had decreased appetite x6 months.  Received oxycodone 5/325, Flexeril, morphine 4 mg IV x2 without  resolution of pain.  Neurosurgery was consulted.  Per the  recommendations, there is no need for acute procedure at this time.  The  patient will be admitted for pain control.   PAST MEDICAL HISTORY:  1. Hypertension.  2. Hyperlipidemia.  3. Arthritis.  4. GERD.  5. 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 question of      cervical spondylosis.   PAST SURGICAL HISTORY:  1. Bilateral eye surgeries.  2. Right knee surgery status post trauma.   MEDICATIONS:  1. Nexium 40 mg p.o. daily.  2. Gabapentin 600 mg p.o. q.i.d.  3.  Tramadol/acetaminophen 2 tablets p.r.n. pain.  4. Lipitor 10 mg p.o. daily.  5. Lisinopril 10 mg p.o. daily.  6. Hydrochlorothiazide 25 mg p.o. daily.  7. Lortab 1 tablet q.8 h. p.r.n. pain.   ALLERGIES:  No known drug allergies.   REVIEW OF SYSTEMS:  As per HPI.  Also positive for dry cough x4 months.   FAMILY HISTORY:  Noncontributory.   SOCIAL HISTORY:  The patient lives with his wife, daughter, and son.  The patient smokes 1 pack per day since age 89.  The patient endorses  occasional alcohol use, 2-3 drinks per week.  Denies present or past  history of illicit substance abuse.   PHYSICAL EXAMINATION:  VITAL SIGNS:  Temperature 97.8, pulse 77, blood  pressure 135/95, respiratory rate 20, and O2 sat 98% on room air.  GENERAL:  NAD, very uncomfortable with minimal movement.  HEENT:  PERRL, EOMI, moist mucous membranes.  CARDIOVASCULAR:  Regular rate and rhythm.  No murmurs, rubs, or gallops.  1+ dorsalis pedis pulses.  RESPIRATORY:  Clear to auscultation bilaterally.  ABDOMEN:  Soft, nontender, and nondistended.  Positive bowel sounds.  EXTREMITIES:  Decreased sensation, left foot.  No edema.  1+ dorsalis  pedis pulses bilaterally.  NEUROLOGIC:  Bilateral upper extremities 5/5 strength, unable to assess  lower extremities secondary to pain.  Cranial nerves II-XII intact to  direct examination.  Sensation is intact in bilateral upper extremities.  Decreased sensation, left foot.  MUSCULOSKELETAL:  No atrophy noted.   LABORATORY DATA:  MRI spine shows L4-L5 mild spinal stenosis and  impingement of left L5 nerve root in the lateral recess; left  paracentral disk protrusion at L5-S1 with associated facet hypertrophy.  Left S1 impingement.   ASSESSMENT AND PLAN:  This is a 70 year old male with:  1. Lumbosacral radiculopathy:  As per MRI report.  Pain not controlled      currently, therefore we will admit to manage pain.  We will      schedule IV Dilaudid 1 mg IV q.4 h. with q.2  h. p.r.n. breakthrough      pain.  We will schedule Tylenol/ibuprofen.  We will check a CBC and      CMET.  We will start prednisone taper to reduce inflammation.      Neurosurgery was consulted via telephone; we reviewed MRI.  No      emergent measures at this point.  Goal is to control pain and      follow outpatient.  2. Hypertension.  We will continue home dose hydrochlorothiazide.  We      will hold lisinopril with complaints of dry cough times several      months.  The patient is not sure when this medication was started.  3. Gastroesophageal reflux disease.  We will use Protonix and place      with Nexium during hospitalization.  4. Hyperlipidemia.  We will continue the patient's home dose Lipitor.  5. Fluids, electrolytes, and nutrition.  We will check CMET, we will      start heart healthy diet.   DISPOSITION:  We will admit to [facility] regular  inpatient bed with goal of pain control prior to discharge.  The patient  will likely require neurosurgical followup in the outpatient setting.      Bobby Rumpf, MD  Electronically Signed      Nestor Ramp, MD  Electronically Signed   KC/MEDQ  D:  04/17/2009  T:  04/18/2009  Job:  [id number]

## 2011-05-08 NOTE — Discharge Summary (Signed)
. [facility]  Patient:    Russell [name], Russell [name]                    MRN: [id number] Adm. Date:  [id number] Disc. Date: [id number] Attending:  Wende Mott                           Discharge Summary  ADMISSION DIAGNOSES: 1. Laceration right quadriceps tendon. 2. Partial laceration open wound right knee.  DISCHARGE DIAGNOSES: 1. Laceration right quadriceps tendon. 2. Partial laceration open wound right knee.  COMPLICATIONS:  None.  INFECTIONS:  None.  OPERATION:  Excision and debridement and repair of quadriceps tendon, and irrigation with antibiotic solution, right knee.  PERTINENT HISTORY:  This is a 70 year old male who had accidentally got his right knee cut while using a steel saw.  The patient came to [facility] Emergency Room for treatment.  PERTINENT PHYSICAL EXAMINATION:  Right knee with long 9 inch laceration oblique fascia down to the quadriceps tendon, partially cutting through the tendon and extending over the patella with a cut into the patella itself. Some debris noted on the soft tissue and about the patella itself.  HOSPITAL COURSE:  The patient underwent preoperative laboratories and was taken to the operating room to undergo irrigation and debridement and repair. Postoperative course, the patient did well, was non-weightbearing with crutches on the right side with use of knee immobilizer, IV and oral antibiotics, and was able to be discharged on Percocet, Cipro, ice packs, elevation, use of crutches, non-weightbearing on the right side, and to return to the office in one week.  CONDITION ON DISCHARGE:  Stable and satisfactory. DD:  08/11/00 TD:  08/12/00 Job: [id number]

## 2011-05-08 NOTE — Op Note (Signed)
[place]  Patient:    Russell [name], Russell [name]                    MRN: [id number] Proc. Date: 07/05/00 Adm. Date:  [id number] Disc. Date: [id number] Attending:  Wende Mott                           Operative Report  PREOPERATIVE DIAGNOSIS:  Laceration of right knee with laceration of quadriceps tendon.  POSTOPERATIVE DIAGNOSIS:  Laceration of right knee with laceration of quadriceps tendon.  PROCEDURE:  Excisional debridement and repair of quadriceps tendon, and irrigation with antibiotic solution.  SURGEON:  Kennieth Rad, M.D.  ANESTHESIA:  General.  DESCRIPTION OF PROCEDURE:  The patient was taken to the operating room and was given ________, given general anesthesia, and was intubated.  The right knee was scrubbed with Betadine scrub, and painted with Betadine solution, and draped in a sterile manner.  The Bovie was used for hemostasis.  The patients wound is an oblique laceration down into the quadriceps tendon and into the patella itself.  The wound itself is 9 inches long with _______ to the quadriceps tendon as well as along the skin edges.  Excisional debridement to skin edges was done with also debridement down of part of the quadriceps tendon.  Copious irrigation was Simpulse was done with the use of also antibiotic irrigation.  After adequate debridement, the quadriceps tendon was cut longitudinally and repair was done with the use of 0 Vicryl.  Next, the subcutaneous tissue was approximated with 2-0 Vicryl and skin staples for the skin.  A compressive dressing was applied and a knee immobilizer applied.  The patient tolerated the procedure quite well and went to the recovery room in stable and satisfactory condition.  The patient is being discharged on Percocet one q.4h. p.r.n. for pain, Cipro 250 mg b.i.d., crutches, use of knee immobilizer, ice packs, and the patient is to return to the office in one week.  The  patient is being discharged in stable and satisfactory condition. DD:  07/05/00 TD:  07/06/00 Job: [id number]

## 2011-05-08 NOTE — H&P (Signed)
[place]  Patient:    Russell [name], Russell [name]                    MRN: [id number] Adm. Date:  [id number] Disc. Date: [id number] Attending:  Wende Mott                         History and Physical  CHIEF COMPLAINT:  Laceration to right knee.  HISTORY OF PRESENT ILLNESS:  This is a 70 year old male who was using a steel saw and hit a fragment which caused the chain to bounce back up against his right thigh and received a cut down to his muscle on his right thigh.  The patient came to [facility] Emergency Room.  PAST MEDICAL HISTORY:  No history of high blood pressure or diabetes.  No previous operations.  ALLERGIES:  None.  MEDICATIONS:  None.  Last tetanus, one year ago.  SOCIAL HISTORY:  The patient does drink occasionally and smokes 1 pack-per-day.  REVIEW OF SYSTEMS:  Basically that of history of present illness.  No cardiac, respiratory, no urinary or bowel symptoms.  PHYSICAL EXAMINATION:  VITAL SIGNS:  Blood pressure 176/62, pulse 52, respirations 18, temperature 97.8.  HEENT:  Normocephalic.  Eyes:  Conjunctivae and Sclerae clear.  NECK:  Supple.  CHEST:  Clear.  CARDIAC:  S1, S2 regular.  ABDOMEN:  Soft active bowel sounds.  EXTREMITIES:  Right knee with a ______ oblique laceration down to the quadriceps tendon, which is partially cut into with part of the cut extending into the patella itself.  Some debris is noted into the soft tissue and into the patella itself.  The patient is able to do straight leg raise.  X-rays within normal limits.  IMPRESSION:  Laceration, right quadriceps tendon, partial laceration, open wound to right knee. DD:  07/06/00 TD:  07/06/00 Job: [id number]

## 2011-05-08 NOTE — Op Note (Signed)
NAME:  Russell [name], Russell [name] NO.:  [id number]   MEDICAL RECORD NO.:  [id number]          PATIENT TYPE:  AMB   LOCATION:  SDS                          FACILITY:  [facility]   PHYSICIAN:  Salley Scarlet., M.D.DATE OF BIRTH:  May 12, 1941   DATE OF PROCEDURE:  DATE OF DISCHARGE:  05/31/2008                               OPERATIVE REPORT   PREOPERATIVE DIAGNOSIS:  Immature cataract, right eye.   POSTOPERATIVE DIAGNOSIS:  Immature cataract, right eye.   OPERATIONS:  Kelman phacoemulsification of cataract, right eye, with  intraocular lens implantation.   ANESTHESIA:  Local using Xylocaine 2% with Marcaine 0.75% and Wydase.   JUSTIFICATION FOR PROCEDURE:  This is a 70 year old gentleman who  underwent A cataract extraction from the left eye several months ago  with good visual result.  He has a cataract of the right eye with a  visual acuity best corrected to 20/60.  Cataract extraction with  intraocular lens implantation was recommended.  He is admitted at this  time for that purpose.   PROCEDURE:  Under the influence of IV sedation and Van Lint akinesia,  retrobulbar anesthesia was given.  The patient was prepped and draped in  the usual manner.  The lid speculum was inserted under the upper and  lower lid of the right eye and a 4-0 silk traction suture was passed  through the belly of the superior rectus muscle for retraction.  A  fornix-based conjunctival flap was turned and hemostasis achieved by  using cautery.  A groove incision made in the sclera at the limbus.  This incision was dissected down to clear cornea using a crescent blade.  A sideport incision was made at 1:30 o'clock position.  OcuCoat was  injected into the eye through the sideport incision.  The anterior  chamber was entered through the corneoscleral tunnel incision at 11:30  o'clock position.  An anterior capsulotomy was done using a bent 25-  gauge needle.  The nucleus was hydrodissected using  Xylocaine.  The KPE  handpiece was passed into the eye and the nucleus was emulsified without  difficulty.  The residual cortical material was aspirated.  The  posterior capsule was polished using an olive-tip polisher.  The wound  was widened slightly to accommodate a foldable silicone lens.  The lens  was seated into the eye behind the iris without difficulty.  The  anterior chamber was reformed and the pupil was constricted using  Miochol.  The lips of the wound were hydrated and tested to make sure  that there was no leak.  After ascertaining that there was no leak, the  conjunctiva was closed over the wound using thermal cautery.  One  milliliters of Celestone and 0.5 mL of gentamicin were injected  subconjunctivally.  Maxitrol ophthalmic ointment and Pilopine ointment  were applied along with a patch and Fox shield.  The patient tolerated  the procedure well and was discharged to the post anesthesia recovery  room in satisfactory condition.   He is instructed to rest today, to take Darvocet-N every 4 hours as  needed for pain and to see me  in office tomorrow for further evaluation.   DISCHARGE DIAGNOSIS:  Immature cataract, right eye.      Salley Scarlet., M.D.  Electronically Signed     TB/MEDQ  D:  06/01/2008  T:  06/01/2008  Job:  [id number]

## 2011-05-21 ENCOUNTER — Encounter: Payer: Self-pay | Admitting: Cardiovascular Disease

## 2011-05-21 ENCOUNTER — Encounter: Payer: Self-pay | Admitting: *Deleted

## 2011-05-22 ENCOUNTER — Ambulatory Visit: Payer: Medicare Other | Admitting: Cardiovascular Disease

## 2011-06-09 ENCOUNTER — Encounter: Payer: Self-pay | Admitting: Cardiovascular Disease

## 2011-08-23 ENCOUNTER — Emergency Department (HOSPITAL_COMMUNITY)
Admission: EM | Admit: 2011-08-23 | Discharge: 2011-08-23 | Disposition: A | Discharge status: Home / Self Care | Payer: Medicare Other | Attending: Emergency Medicine | Admitting: Emergency Medicine

## 2011-08-23 DIAGNOSIS — J449 Chronic obstructive pulmonary disease, unspecified: Secondary | ICD-10-CM | POA: Insufficient documentation

## 2011-08-23 DIAGNOSIS — M79609 Pain in unspecified limb: Secondary | ICD-10-CM | POA: Insufficient documentation

## 2011-08-23 DIAGNOSIS — J4489 Other specified chronic obstructive pulmonary disease: Secondary | ICD-10-CM | POA: Insufficient documentation

## 2011-08-23 DIAGNOSIS — M25539 Pain in unspecified wrist: Secondary | ICD-10-CM | POA: Insufficient documentation

## 2011-08-23 DIAGNOSIS — I10 Essential (primary) hypertension: Secondary | ICD-10-CM | POA: Insufficient documentation

## 2011-09-14 LAB — URINALYSIS, ROUTINE W REFLEX MICROSCOPIC
Glucose, UA: NEGATIVE
Specific Gravity, Urine: 1.022
pH: 5

## 2011-09-14 LAB — URINE MICROSCOPIC-ADD ON

## 2011-09-14 LAB — CBC
RBC: 4.65
WBC: 9.9

## 2011-09-14 LAB — BASIC METABOLIC PANEL
Calcium: 9.2
Chloride: 105
Creatinine, Ser: 1.09
GFR calc Af Amer: >60

## 2011-09-17 LAB — URINALYSIS, ROUTINE W REFLEX MICROSCOPIC
Ketones, ur: 15 — AB
Nitrite: NEGATIVE
Specific Gravity, Urine: 1.018
Urobilinogen, UA: 1
pH: 5.5

## 2011-09-17 LAB — CBC
MCV: 81.4
Platelets: 381
RDW: 17.7 — ABNORMAL HIGH
WBC: 10.1

## 2011-09-17 LAB — BASIC METABOLIC PANEL
BUN: 18
Creatinine, Ser: 1.09
GFR calc non Af Amer: >60

## 2011-09-17 LAB — URINE MICROSCOPIC-ADD ON

## 2011-09-18 LAB — COMPREHENSIVE METABOLIC PANEL
ALT: 17
AST: 15
Alkaline Phosphatase: 63
CO2: 26
Chloride: 108
GFR calc Af Amer: >60
GFR calc non Af Amer: >60
Glucose, Bld: 151 — ABNORMAL HIGH
Potassium: 3.7
Sodium: 141

## 2011-09-18 LAB — URINALYSIS, ROUTINE W REFLEX MICROSCOPIC
Bilirubin Urine: NEGATIVE
Glucose, UA: NEGATIVE
Hgb urine dipstick: NEGATIVE
Ketones, ur: 15 — AB
Nitrite: NEGATIVE
Protein, ur: NEGATIVE
Specific Gravity, Urine: 1.023
Urobilinogen, UA: 1
pH: 5

## 2011-09-18 LAB — CBC
HCT: 33.7 — ABNORMAL LOW
Hemoglobin: 10.8 — ABNORMAL LOW
MCHC: 32.2
MCV: 82
Platelets: 380
RBC: 4.11 — ABNORMAL LOW
RDW: 17.1 — ABNORMAL HIGH
WBC: 10.8 — ABNORMAL HIGH

## 2011-09-18 LAB — DIFFERENTIAL
Basophils Absolute: 0.2 — ABNORMAL HIGH
Basophils Relative: 2 — ABNORMAL HIGH
Monocytes Relative: 6
Neutro Abs: 7
Neutrophils Relative %: 65

## 2011-09-18 LAB — LIPASE, BLOOD: Lipase: 32

## 2011-09-18 LAB — URINE MICROSCOPIC-ADD ON

## 2011-09-22 ENCOUNTER — Encounter (HOSPITAL_BASED_OUTPATIENT_CLINIC_OR_DEPARTMENT_OTHER)
Admission: RE | Admit: 2011-09-22 | Discharge: 2011-09-22 | Disposition: A | Discharge status: Home / Self Care | Payer: Medicare Other | Source: Ambulatory Visit | Attending: Orthopedic Surgery | Admitting: Orthopedic Surgery

## 2011-09-22 ENCOUNTER — Other Ambulatory Visit: Payer: Self-pay | Admitting: Orthopedic Surgery

## 2011-09-22 ENCOUNTER — Ambulatory Visit
Admission: RE | Admit: 2011-09-22 | Discharge: 2011-09-22 | Disposition: A | Discharge status: Home / Self Care | Payer: Medicare Other | Source: Ambulatory Visit | Attending: Orthopedic Surgery | Admitting: Orthopedic Surgery

## 2011-09-22 DIAGNOSIS — Z01811 Encounter for preprocedural respiratory examination: Secondary | ICD-10-CM

## 2011-09-22 LAB — BASIC METABOLIC PANEL
GFR calc Af Amer: >90 mL/min (ref >90)
GFR calc non Af Amer: 82 mL/min — ABNORMAL LOW (ref >90)
Glucose, Bld: 94 mg/dL (ref 70–99)
Potassium: 4.4 mEq/L (ref 3.5–5.1)
Sodium: 141 mEq/L (ref 135–145)

## 2011-09-24 ENCOUNTER — Ambulatory Visit (HOSPITAL_BASED_OUTPATIENT_CLINIC_OR_DEPARTMENT_OTHER)
Admission: RE | Admit: 2011-09-24 | Discharge: 2011-09-24 | Disposition: A | Discharge status: Home / Self Care | Payer: Medicare Other | Source: Ambulatory Visit | Attending: Orthopedic Surgery | Admitting: Orthopedic Surgery

## 2011-09-24 DIAGNOSIS — Z01810 Encounter for preprocedural cardiovascular examination: Secondary | ICD-10-CM | POA: Insufficient documentation

## 2011-09-24 DIAGNOSIS — I10 Essential (primary) hypertension: Secondary | ICD-10-CM | POA: Insufficient documentation

## 2011-09-24 DIAGNOSIS — J4489 Other specified chronic obstructive pulmonary disease: Secondary | ICD-10-CM | POA: Insufficient documentation

## 2011-09-24 DIAGNOSIS — G8929 Other chronic pain: Secondary | ICD-10-CM | POA: Insufficient documentation

## 2011-09-24 DIAGNOSIS — M19019 Primary osteoarthritis, unspecified shoulder: Secondary | ICD-10-CM | POA: Insufficient documentation

## 2011-09-24 DIAGNOSIS — F17200 Nicotine dependence, unspecified, uncomplicated: Secondary | ICD-10-CM | POA: Insufficient documentation

## 2011-09-24 DIAGNOSIS — Z9981 Dependence on supplemental oxygen: Secondary | ICD-10-CM | POA: Insufficient documentation

## 2011-09-24 DIAGNOSIS — J449 Chronic obstructive pulmonary disease, unspecified: Secondary | ICD-10-CM | POA: Insufficient documentation

## 2011-09-24 DIAGNOSIS — M24119 Other articular cartilage disorders, unspecified shoulder: Secondary | ICD-10-CM | POA: Insufficient documentation

## 2011-09-24 DIAGNOSIS — Z01818 Encounter for other preprocedural examination: Secondary | ICD-10-CM | POA: Insufficient documentation

## 2011-09-24 DIAGNOSIS — K219 Gastro-esophageal reflux disease without esophagitis: Secondary | ICD-10-CM | POA: Insufficient documentation

## 2011-09-24 DIAGNOSIS — Z01812 Encounter for preprocedural laboratory examination: Secondary | ICD-10-CM | POA: Insufficient documentation

## 2011-09-24 HISTORY — PX: OTHER SURGICAL HISTORY: SHX169

## 2011-09-24 LAB — POCT HEMOGLOBIN-HEMACUE: Hemoglobin: 11.8 g/dL — ABNORMAL LOW (ref 13.0–17.0)

## 2011-09-30 NOTE — Op Note (Addendum)
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  PROCEDURE:  Left shoulder arthroscopy, debridement of labral tear and biceps tenotomy.  SURGEON:  Betha Loa, MD  ASSISTANT:  Katy Fitch. Sypher, MD  ANESTHESIA:  General.  IV FLUIDS:  Per anesthesia flow sheet.  ESTIMATED BLOOD LOSS:  Minimal.  COMPLICATIONS:  None.  SPECIMENS:  None.  DISPOSITION:  Stable to PACU.  INDICATIONS:  Russell [name] is a 70 year old male who complains of bilateral shoulder pain.  He has been seen in the office multiple times and undergone therapy and injection with no relief.  MRI was done of his shoulder revealing a superior labral tear and enlargement of the [facility] joint.  No cuff tear was noted.  I discussed with Russell [name] the nature of his condition.  We discussed continued nonoperative treatment with injections and therapy versus operative treatment with arthroscopy and debridement.  Risks, benefits and alternatives of surgery were discussed including risk of blood loss, infection, damage to nerves, vessels, tendons, ligaments bone, failure of surgery, need for additional surgery, complications with wound healing, continued pain.  He voiced understanding of these risks and elected to proceed.  OPERATIVE COURSE:  After being identified preoperatively by myself, the patient and I agreed upon the procedure and site of procedure.  The surgical site was marked.  The risks, benefits and alternatives of surgery were reviewed  and he wished to proceed.  Surgical consent had been signed.  He was given 1 gram of IV Ancef as preoperative antibiotic prophylaxis.  He was transported to the operating room and placed on the operating room table in supine position.  General anesthesia was induced by the anesthesiologist.  The operating table was placed into the beach- chair position.  The head was secured in head holder.  The left upper extremity was prepped and draped in normal sterile orthopedic fashion. A surgical pause was performed between surgeons, Anesthesia, operating room staff and all were in agreement as to the  patient, procedure, and site of procedure.  Standard posterior portal to the shoulder was made. The glenohumeral joint was inspected after the camera was introduced. There was noted to be damage to the chondral surface of the humeral head with complete loss of cartilage in one section.  A superior labral tear was noted.  It was able to be peeled back.  This was a grade 2 type tear.  There was noted to be loose body in the inferior recess.  The biceps tendon was intact.  The labrum was significantly frayed.  The cuff was inspected and noted to be intact.  The shaver was introduced through the anterior portal.  Debridement of the labrum was performed. Biceps tenotomy was performed.  This  allowed the biceps to cease pulling on the labral tear.  After adequate debridement was obtained, the labrum was inspected and felt to be stable and not pulling into the glenohumeral space.  The shaver was introduced into the inferior recess to remove the loose bodies.  The camera was then placed into the subacromial space.  The bursa was resected.  It was felt that the acromion was not hooked and it was not impinging on the cuff.  The cuff was inspected and there was no abrasion.  The [facility] joint did not appear to be impinging on the cuff either.  The arthroscopic equipment was then removed.  The portals were closed with  3-0 nylon in a horizontal mattress fashion.  They were dressed with sterile Xeroform and 4x4s and ABDs and secured with paper tape.  The operative drapes were removed and the patient was awoken from anesthesia safely.  He was transferred back to the stretcher and taken to the PACU in stable condition.  I will see him back in the office in 1 week for postoperative followup.  I will give him Percocet 5/325 one to two p.o. q.6 h. p.r.n. pain, dispensed #40.     Betha Loa, MD     KK/MEDQ  D:  09/24/2011  T:  09/24/2011  Job:  [id number]  Electronically Signed by Betha Loa  on 10/02/2011 10:24:44 AM

## 2011-12-04 ENCOUNTER — Ambulatory Visit (INDEPENDENT_AMBULATORY_CARE_PROVIDER_SITE_OTHER): Payer: Medicare Other | Admitting: Vascular Surgery

## 2011-12-04 DIAGNOSIS — I70219 Atherosclerosis of native arteries of extremities with intermittent claudication, unspecified extremity: Secondary | ICD-10-CM

## 2012-02-07 ENCOUNTER — Other Ambulatory Visit: Payer: Self-pay

## 2012-02-07 ENCOUNTER — Encounter (HOSPITAL_COMMUNITY): Payer: Self-pay | Admitting: *Deleted

## 2012-02-07 ENCOUNTER — Emergency Department (HOSPITAL_COMMUNITY): Payer: Medicare Other

## 2012-02-07 ENCOUNTER — Emergency Department (HOSPITAL_COMMUNITY)
Admission: EM | Admit: 2012-02-07 | Discharge: 2012-02-07 | Disposition: A | Discharge status: Home / Self Care | Payer: Medicare Other | Attending: Emergency Medicine | Admitting: Emergency Medicine

## 2012-02-07 DIAGNOSIS — K5289 Other specified noninfective gastroenteritis and colitis: Secondary | ICD-10-CM | POA: Insufficient documentation

## 2012-02-07 DIAGNOSIS — I10 Essential (primary) hypertension: Secondary | ICD-10-CM | POA: Insufficient documentation

## 2012-02-07 DIAGNOSIS — Z79899 Other long term (current) drug therapy: Secondary | ICD-10-CM | POA: Insufficient documentation

## 2012-02-07 DIAGNOSIS — N138 Other obstructive and reflux uropathy: Secondary | ICD-10-CM | POA: Insufficient documentation

## 2012-02-07 DIAGNOSIS — F3289 Other specified depressive episodes: Secondary | ICD-10-CM | POA: Insufficient documentation

## 2012-02-07 DIAGNOSIS — M79609 Pain in unspecified limb: Secondary | ICD-10-CM | POA: Insufficient documentation

## 2012-02-07 DIAGNOSIS — E119 Type 2 diabetes mellitus without complications: Secondary | ICD-10-CM | POA: Insufficient documentation

## 2012-02-07 DIAGNOSIS — E7800 Pure hypercholesterolemia, unspecified: Secondary | ICD-10-CM | POA: Insufficient documentation

## 2012-02-07 DIAGNOSIS — J4489 Other specified chronic obstructive pulmonary disease: Secondary | ICD-10-CM | POA: Insufficient documentation

## 2012-02-07 DIAGNOSIS — K219 Gastro-esophageal reflux disease without esophagitis: Secondary | ICD-10-CM | POA: Insufficient documentation

## 2012-02-07 DIAGNOSIS — H409 Unspecified glaucoma: Secondary | ICD-10-CM | POA: Insufficient documentation

## 2012-02-07 DIAGNOSIS — K529 Noninfective gastroenteritis and colitis, unspecified: Secondary | ICD-10-CM

## 2012-02-07 DIAGNOSIS — N401 Enlarged prostate with lower urinary tract symptoms: Secondary | ICD-10-CM | POA: Insufficient documentation

## 2012-02-07 DIAGNOSIS — N139 Obstructive and reflux uropathy, unspecified: Secondary | ICD-10-CM | POA: Insufficient documentation

## 2012-02-07 DIAGNOSIS — F329 Major depressive disorder, single episode, unspecified: Secondary | ICD-10-CM | POA: Insufficient documentation

## 2012-02-07 DIAGNOSIS — J449 Chronic obstructive pulmonary disease, unspecified: Secondary | ICD-10-CM | POA: Insufficient documentation

## 2012-02-07 DIAGNOSIS — K5730 Diverticulosis of large intestine without perforation or abscess without bleeding: Secondary | ICD-10-CM | POA: Insufficient documentation

## 2012-02-07 LAB — COMPREHENSIVE METABOLIC PANEL
ALT: 13 U/L (ref 0–53)
AST: 11 U/L (ref 0–37)
Albumin: 3.8 g/dL (ref 3.5–5.2)
CO2: 27 mEq/L (ref 19–32)
Chloride: 107 mEq/L (ref 96–112)
GFR calc non Af Amer: 84 mL/min — ABNORMAL LOW (ref >90)
Sodium: 145 mEq/L (ref 135–145)
Total Bilirubin: 0.3 mg/dL (ref 0.3–1.2)

## 2012-02-07 LAB — CBC
Platelets: 246 10*3/uL (ref 150–400)
RBC: 4.68 MIL/uL (ref 4.22–5.81)
WBC: 11.8 10*3/uL — ABNORMAL HIGH (ref 4.0–10.5)

## 2012-02-07 MED ORDER — IOHEXOL 300 MG/ML  SOLN
100.0000 mL | Freq: Once | INTRAMUSCULAR | Status: AC | PRN
  Administered 2012-02-07: 100 mL via INTRAVENOUS

## 2012-02-07 MED ORDER — ONDANSETRON HCL 4 MG/2ML IJ SOLN
4.0000 mg | Freq: Once | INTRAMUSCULAR | Status: AC
  Administered 2012-02-07: 4 mg via INTRAVENOUS
  Filled 2012-02-07: qty 2

## 2012-02-07 MED ORDER — HYDROCODONE-ACETAMINOPHEN 5-325 MG PO TABS: 1.0000 | ORAL_TABLET | Freq: Four times a day (QID) | ORAL | Status: AC | PRN

## 2012-02-07 MED ORDER — PANTOPRAZOLE SODIUM 40 MG IV SOLR
40.0000 mg | Freq: Once | INTRAVENOUS | Status: AC
  Administered 2012-02-07: 40 mg via INTRAVENOUS
  Filled 2012-02-07 (×2): qty 40

## 2012-02-07 MED ORDER — SODIUM CHLORIDE 0.9 % IV BOLUS (SEPSIS)
500.0000 mL | Freq: Once | INTRAVENOUS | Status: AC
  Administered 2012-02-07: 1000 mL via INTRAVENOUS

## 2012-02-07 MED ORDER — IOHEXOL 300 MG/ML  SOLN: 20.0000 mL | INTRAMUSCULAR | Status: AC

## 2012-02-07 NOTE — ED Notes (Signed)
Patient given discharge paperwork; went over discharge instructions with patient.  Instructed patient to take Norco as directed, to not drive while taking pain medication, to follow up with primary care physician, and to return to the ED for new, worsening, or concerning symptoms.

## 2012-02-07 NOTE — Discharge Instructions (Signed)
Follow up with [facility] for recheck.  Follow up with your family md for recheck in 1 week.

## 2012-02-07 NOTE — ED Notes (Addendum)
Reports mid abd pain since yesterday with n/v. Denies diarrhea, last bm was yesterday. Reports burning pain to abd. Also having left shoulder/arm pain.

## 2012-02-07 NOTE — ED Notes (Signed)
Pt c/o N/V, lower abd pain and (L) arm pain x3 days. Pt states he vomits every time he eats or drinks. Pt rates his abd pain 9/10 and describes the pain as constant throbbing. Pt reports the pain starts in the bone of upper (L) arm and radiates down to (L) thumb for a long time, rates pain 10/10 describes it as constant throbbing

## 2012-02-07 NOTE — ED Provider Notes (Signed)
History     CSN: [id number]  Arrival date & time 02/07/12  1416   First MD Initiated Contact with Patient 02/07/12 1532      Chief Complaint  Patient presents with  . Abdominal Pain  . Arm Pain    (Consider location/radiation/quality/duration/timing/severity/associated sxs/prior treatment) Patient is a 71 y.o. male presenting with abdominal pain and arm pain. The history is provided by the patient (Patient complains of some abdominal pain and cramping for last couple days. He had some nausea no diarrhea.). No language interpreter was used.  Abdominal Pain The primary symptoms of the illness include abdominal pain. The primary symptoms of the illness do not include fatigue or diarrhea. The current episode started 13 to 24 hours ago. The onset of the illness was sudden. The problem has been gradually improving.  The illness is associated with eating. The patient states that she believes she is currently not pregnant. The patient has not had a change in bowel habit. Symptoms associated with the illness do not include heartburn, hematuria, frequency or back pain. Significant associated medical issues do not include sickle cell disease.  Arm Pain Associated symptoms include abdominal pain. Pertinent negatives include no chest pain and no headaches.    Past Medical History  Diagnosis Date  . Scabies   . DIABETES MELLITUS, TYPE II, CONTROLLED   . HYPERCHOLESTEROLEMIA, MIXED   . ANEMIA, IRON DEFICIENCY NOS   . ERECTILE DYSFUNCTION   . CATARACTS, BILATERAL   . HYPERTENSION   . COPD   . G E R D   . DIVERTICULOSIS, COLON   . MILD SPINAL STENOSIS, CERVICAL   . DYSPHAGIA UNSPECIFIED   . SEIZURE, GRAND MAL   . BENIGN PROSTATIC HYPERTROPHY, WITH URINARY OBSTRUCTION   . HTN (hypertension)   . Mitral regurgitation   . Glaucoma   . Depression     Past Surgical History  Procedure Date  . Knee arthroscopy   . Eye surgery   . Intracervical discectomy with fusion  c4-5 and c5-6--dr. cram      Family History  Problem Relation Age of Onset  . Diabetes    . Cancer      History  Substance Use Topics  . Smoking status: Former Games developer  . Smokeless tobacco: Not on file  . Alcohol Use: Yes      Review of Systems  Constitutional: Negative for fatigue.  HENT: Negative for congestion, sinus pressure and ear discharge.   Eyes: Negative for discharge.  Respiratory: Negative for cough.   Cardiovascular: Negative for chest pain.  Gastrointestinal: Positive for abdominal pain. Negative for heartburn and diarrhea.  Genitourinary: Negative for frequency and hematuria.  Musculoskeletal: Negative for back pain.  Skin: Negative for rash.  Neurological: Negative for seizures and headaches.  Hematological: Negative.   Psychiatric/Behavioral: Negative for hallucinations.    Allergies  Review of patient's allergies indicates no known allergies.  Home Medications   Current Outpatient Rx  Name Route Sig Dispense Refill  . AMITRIPTYLINE HCL 10 MG PO TABS Oral Take 10 mg by mouth at bedtime.    Marland Kitchen CIPROFLOXACIN HCL 500 MG PO TABS Oral Take 500 mg by mouth 2 (two) times daily.    . DULOXETINE HCL 60 MG PO CPEP Oral Take 60 mg by mouth daily.    Marland Kitchen ESOMEPRAZOLE MAGNESIUM 40 MG PO CPDR Oral Take 40 mg by mouth 2 (two) times daily.    Marland Kitchen GABAPENTIN 600 MG PO TABS Oral Take 600 mg by mouth 2 (two) times  daily.    Marland Kitchen LOSARTAN POTASSIUM 100 MG PO TABS Oral Take 100 mg by mouth daily.    . MELOXICAM 7.5 MG PO TABS Oral Take 7.5 mg by mouth daily.    . OXYBUTYNIN CHLORIDE ER 10 MG PO TB24 Oral Take 10 mg by mouth daily.    Marland Kitchen PRAVASTATIN SODIUM 80 MG PO TABS Oral Take 80 mg by mouth daily.    Marland Kitchen HYDROCODONE-ACETAMINOPHEN 5-325 MG PO TABS Oral Take 1 tablet by mouth every 6 (six) hours as needed for pain. 12 tablet 0    BP 144/76  Pulse 54  Temp(Src) 98.3 F (36.8 C) (Oral)  Resp 20  SpO2 98%  Physical Exam  Constitutional: He is oriented to person, place, and time. He appears  well-developed.  HENT:  Head: Normocephalic and atraumatic.  Eyes: Conjunctivae and EOM are normal. No scleral icterus.  Neck: Neck supple. No thyromegaly present.  Cardiovascular: Normal rate and regular rhythm.  Exam reveals no gallop and no friction rub.   No murmur heard. Pulmonary/Chest: No stridor. He has no wheezes. He has no rales. He exhibits no tenderness.  Abdominal: He exhibits no distension. There is tenderness. There is no rebound.       Mild tendernous throughout  Musculoskeletal: Normal range of motion. He exhibits no edema.       Mild tendernous to left arm  Lymphadenopathy:    He has no cervical adenopathy.  Neurological: He is oriented to person, place, and time. Coordination normal.  Skin: No rash noted. No erythema.  Psychiatric: He has a normal mood and affect. His behavior is normal.    ED Course  Procedures (including critical care time)  Labs Reviewed  CBC - Abnormal; Notable for the following:    WBC 11.8 (*)    Hemoglobin 12.2 (*)    HCT 36.3 (*)    MCV 77.6 (*)    RDW 17.7 (*)    All other components within normal limits  COMPREHENSIVE METABOLIC PANEL - Abnormal; Notable for the following:    Potassium 3.4 (*)    Glucose, Bld 102 (*)    GFR calc non Af Amer 84 (*)    All other components within normal limits  LIPASE, BLOOD  TROPONIN I   Ct Abdomen Pelvis W Contrast  02/07/2012  *RADIOLOGY REPORT*  Clinical Data: Right-sided abdominal pain.  CT ABDOMEN AND PELVIS WITH CONTRAST  Technique:  Multidetector CT imaging of the abdomen and pelvis was performed following the standard protocol during bolus administration of intravenous contrast.  Contrast: OMNIPAQUE IOHEXOL 300 MG/ML IV SOLN  Comparison: 07/01/2010.  Findings: Dependent atelectasis at the lung bases.  Low density lesions in the left and right lobes of the liver are compatible with cysts and appear unchanged compared to prior.  Bilateral renal cysts are also present.  The adrenal glands  appear normal.  The spleen is normal.  The stomach is distended with oral contrast. The duodenum is within normal limits.  Pancreas normal. Gallbladder normal.  No calcified gallstones.  The small bowel appears within normal limits without evidence of obstruction. Normal appendix in the right lower quadrant.  Abnormal fluid levels are present within the colon however these are nonspecific.  There is no colonic mural inflammatory change. The descending colon demonstrates diverticulosis that extends into the sigmoid.  The prostatomegaly is present.  Distention of the urinary bladder is present.  The aortoiliac atherosclerosis is present, dense in the common iliac arteries.  No free fluid or  free air.  Multilevel lower lumbar compression fractures are present which are biconcave with stable configuration compared to 07/01/2010 compatible with chronic fractures. Delayed renal images show good excretion of contrast in the kidneys.  Per CMS PQRS reporting requirements (PQRS Measure 24): Given the patient's age of greater than 50 and the fracture site (hip, distal radius, or spine), the patient should be tested for osteoporosis using DXA, and the appropriate treatment considered based on the DXA results.  IMPRESSION: 1.  Nonspecific fluid levels in the colon.  These are abnormal and most commonly associated with enteric infection.  In this patient with severe atherosclerosis, ischemic colitis is another possibility.  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 2.  Hepatic and renal cysts appear unchanged compared to prior. 3.  Prostatomegaly and distention of the urinary bladder.  Original Report Authenticated By: Andreas Newport, M.D.   Dg Abd Acute W/chest  02/07/2012  *RADIOLOGY REPORT*  Clinical Data: Heartburn and vomiting with left arm numbness. COPD.  Diabetes.  Hypertension.  ACUTE ABDOMEN SERIES (ABDOMEN 2 VIEW & CHEST 1 VIEW)  Comparison: 07/03/2010  Findings: Frontal view of the chest  demonstrates midline trachea. Normal heart size and mediastinal contours for age.  Tortuous descending thoracic aorta.  Biapical pleural thickening.  Suspect mild hyperinflation. Clear lungs.  Abominal films demonstrate lucency under left hemi diaphragm, which could be within the stomach.  Scattered air-fluid levels within the abdomen, favored to primarily be within the colon.  Supine images demonstrate no significant bowel dilatation.  Distal gas identified.  No pneumatosis.  Phleboliths in the pelvis.  IMPRESSION:  1. No acute cardiopulmonary disease. 2.  Lucency under the left hemidiaphragm.  This could be within the stomach.  If there is a concern of free intraperitoneal air, further evaluation with decubitus films or CT should be considered. 3.  Scattered air-fluid levels, without small bowel dilatation. Question adynamic ileus.  Original Report Authenticated By: Consuello Bossier, M.D.     1. Gastroenteritis      Date: 02/07/2012  Rate: 70  Rhythm: normal sinus rhythm  QRS Axis: normal  Intervals: PR prolonged  ST/T Wave abnormalities: nonspecific ST changes  Inverted t waves  Conduction Disutrbances:none  Narrative Interpretation:   Old EKG Reviewed: changes noted  Patient has new inverted T waves on EKG. He denies any chest pain shortness of breath sweating patient is to follow with his cardiologist  MDM   Patient with abdominal pain consistent with gastroenteritis.       Benny Lennert, MD 02/07/12 2038

## 2012-04-13 ENCOUNTER — Encounter: Payer: Self-pay | Admitting: *Deleted

## 2012-04-13 ENCOUNTER — Encounter: Payer: Medicare Other | Attending: Internal Medicine | Admitting: *Deleted

## 2012-04-13 VITALS — Ht 74.0 in | Wt 165.1 lb

## 2012-04-13 DIAGNOSIS — Z713 Dietary counseling and surveillance: Secondary | ICD-10-CM | POA: Insufficient documentation

## 2012-04-13 DIAGNOSIS — E7800 Pure hypercholesterolemia, unspecified: Secondary | ICD-10-CM

## 2012-04-13 NOTE — Patient Instructions (Signed)
Plan

## 2012-04-13 NOTE — Progress Notes (Signed)
Medical Nutrition Therapy:  Appt start time: 0915 end time:  1015.  Assessment:  Primary concerns today: Patient here with his wife who appears supportive. She prepares the meals and states she typically grills and bakes as able. Patient does not work, his activity includes yard work and gardening. He states he stopped taking his cholesterol medication for awhile but has resumed taking it now.   MEDICATIONS: see list   DIETARY INTAKE:  Usual eating pattern includes 2 meals and 2 snacks per day.  Everyday foods include variety of most food groups.  Avoided foods include adequate milk of fruit.    24-hr recall:  B ( AM): grits x 1-11/2 cups, eggs x 2 scrambled, biscuit or toast with jelly, coffee with cream (whole milk)  Snk ( AM): occasional sandwich PNBJ or salami,   L ( PM): skips usually Snk ( PM): same as AM D ( PM): meat, sometimes lean, starch x 2, vegetables, bread, water or regular soda @ 12-16 oz Snk ( PM): none Beverages: coffee, water, regular soda or sweet tea occasionally  Usual physical activity: yard work, Armed forces logistics/support/administrative officer garden,   Progress Towards Goal(s):  In progress.   Nutritional Diagnosis:  NB-1.1 Food and nutrition-related knowledge deficit As related to cholesterol.  As evidenced by LDL cholesterol of 134 mg/dl.    Intervention:  Nutrition counseling provided emphasizing affect of saturated (animal) fats on levels of LDL cholesterol in his body. He feels the elevation is due to the time he didn't take his cholesterol medication and he is taking it now, so feels the levels will improve anyway.  Handouts given during visit include:  Destination Heart Healthy Eating  Food Labels  Monitoring/Evaluation:  Dietary intake, exercise, reading food labels, and body weight prn.

## 2012-06-17 ENCOUNTER — Emergency Department (HOSPITAL_COMMUNITY)
Admission: EM | Admit: 2012-06-17 | Discharge: 2012-06-17 | Disposition: A | Discharge status: Home / Self Care | Payer: Medicare Other | Attending: Emergency Medicine | Admitting: Emergency Medicine

## 2012-06-17 ENCOUNTER — Encounter (HOSPITAL_COMMUNITY): Payer: Self-pay

## 2012-06-17 DIAGNOSIS — F17200 Nicotine dependence, unspecified, uncomplicated: Secondary | ICD-10-CM | POA: Insufficient documentation

## 2012-06-17 DIAGNOSIS — N138 Other obstructive and reflux uropathy: Secondary | ICD-10-CM | POA: Insufficient documentation

## 2012-06-17 DIAGNOSIS — E7800 Pure hypercholesterolemia, unspecified: Secondary | ICD-10-CM | POA: Insufficient documentation

## 2012-06-17 DIAGNOSIS — Z981 Arthrodesis status: Secondary | ICD-10-CM | POA: Insufficient documentation

## 2012-06-17 DIAGNOSIS — H409 Unspecified glaucoma: Secondary | ICD-10-CM | POA: Insufficient documentation

## 2012-06-17 DIAGNOSIS — E119 Type 2 diabetes mellitus without complications: Secondary | ICD-10-CM | POA: Insufficient documentation

## 2012-06-17 DIAGNOSIS — N401 Enlarged prostate with lower urinary tract symptoms: Secondary | ICD-10-CM | POA: Insufficient documentation

## 2012-06-17 DIAGNOSIS — J4489 Other specified chronic obstructive pulmonary disease: Secondary | ICD-10-CM | POA: Insufficient documentation

## 2012-06-17 DIAGNOSIS — K219 Gastro-esophageal reflux disease without esophagitis: Secondary | ICD-10-CM | POA: Insufficient documentation

## 2012-06-17 DIAGNOSIS — J449 Chronic obstructive pulmonary disease, unspecified: Secondary | ICD-10-CM | POA: Insufficient documentation

## 2012-06-17 DIAGNOSIS — D649 Anemia, unspecified: Secondary | ICD-10-CM | POA: Insufficient documentation

## 2012-06-17 DIAGNOSIS — I10 Essential (primary) hypertension: Secondary | ICD-10-CM | POA: Insufficient documentation

## 2012-06-17 DIAGNOSIS — F329 Major depressive disorder, single episode, unspecified: Secondary | ICD-10-CM | POA: Insufficient documentation

## 2012-06-17 DIAGNOSIS — Z9849 Cataract extraction status, unspecified eye: Secondary | ICD-10-CM | POA: Insufficient documentation

## 2012-06-17 DIAGNOSIS — F3289 Other specified depressive episodes: Secondary | ICD-10-CM | POA: Insufficient documentation

## 2012-06-17 DIAGNOSIS — R55 Syncope and collapse: Secondary | ICD-10-CM

## 2012-06-17 NOTE — ED Notes (Signed)
Patient was ambulatory without any problems.

## 2012-06-17 NOTE — ED Notes (Signed)
Spoke to Velora Heckler at [facility] office who assisted Dr. Maurice Small with the procedure and informed me that the patient had an Intra Laminal Cervical Epidural Injection with 10 mg of Dexamethasone, Isoview with Xray Contrast Media, Lidocaine 1% and 0.9 NS. She informed me that the patient's pre-procedure VS were as follows, BP= 140/89, HR= 64, and after the procedure was done and the needle was being pulled out the patient became lethargic, diaphoretic, his BP dropped to 92/63, HR dropped to 36, O2 sat was 90% per Darl Pikes. Dr. Effie Shy was made aware.

## 2012-06-17 NOTE — ED Notes (Signed)
Pt was brought in by ambulance from the Orthopedic's office having a procedure done when he got lightheaded, diaphoretic and bradycardic with HR in the 30's. Pt denies any chest discomfort, no LOC. Pt is A/A/Ox4, skin is warm and dry, respiration is even and unlabored.

## 2012-06-17 NOTE — ED Provider Notes (Signed)
History     CSN: [id number]  Arrival date & time 06/17/12  1478   First MD Initiated Contact with Patient 06/17/12 1004      Chief Complaint  Patient presents with  . Near Syncope    (Consider location/radiation/quality/duration/timing/severity/associated sxs/prior treatment) HPI Comments: Russell [name] is a 71 y.o. Male who was at the orthopedic  office today; receiving a injection for pain in his left upper back; when he suddenly dropped his heart rate, and blood pressure he was diaphoretic. EMS was summoned, and they found him mildly hypotensive. He was transferred here for evaluation. On arrival. He feels 100% better and is back to his baseline. He has no chest pain, weakness, dizziness, nausea, vomiting, shortness of breath when evaluated. He has not had this happen previously.  The history is provided by the patient.    Past Medical History  Diagnosis Date  . Scabies   . DIABETES MELLITUS, TYPE II, CONTROLLED   . HYPERCHOLESTEROLEMIA, MIXED   . ANEMIA, IRON DEFICIENCY NOS   . ERECTILE DYSFUNCTION   . CATARACTS, BILATERAL   . HYPERTENSION   . COPD   . G E R D   . DIVERTICULOSIS, COLON   . MILD SPINAL STENOSIS, CERVICAL   . DYSPHAGIA UNSPECIFIED   . SEIZURE, GRAND MAL   . BENIGN PROSTATIC HYPERTROPHY, WITH URINARY OBSTRUCTION   . HTN (hypertension)   . Mitral regurgitation   . Glaucoma   . Depression     Past Surgical History  Procedure Date  . Knee arthroscopy   . Eye surgery   . Intracervical discectomy with fusion  c4-5 and c5-6--dr. cram     Family History  Problem Relation Age of Onset  . Diabetes    . Cancer      History  Substance Use Topics  . Smoking status: Current Everyday Smoker -- 1.0 packs/day  . Smokeless tobacco: Never Used  . Alcohol Use: No      Review of Systems  All other systems reviewed and are negative.    Allergies  Review of patient's allergies indicates no known allergies.  Home Medications   Current  Outpatient Rx  Name Route Sig Dispense Refill  . AMITRIPTYLINE HCL 10 MG PO TABS Oral Take 10 mg by mouth at bedtime.    Marland Kitchen BIMATOPROST 0.01 % OP SOLN Both Eyes Place 1 drop into both eyes at bedtime.    Elmer Sow OP Right Eye Place 1 drop into the right eye 2 (two) times daily.    . DULOXETINE HCL 60 MG PO CPEP Oral Take 60 mg by mouth 2 (two) times daily.     Marland Kitchen ESOMEPRAZOLE MAGNESIUM 40 MG PO CPDR Oral Take 40 mg by mouth 2 (two) times daily.    Marland Kitchen GABAPENTIN 600 MG PO TABS Oral Take 600 mg by mouth daily.     Marland Kitchen HYDROCODONE-ACETAMINOPHEN 5-500 MG PO TABS Oral Take 1 tablet by mouth every 6 (six) hours as needed. For pain    . LOSARTAN POTASSIUM 100 MG PO TABS Oral Take 100 mg by mouth daily.    . MELOXICAM 7.5 MG PO TABS Oral Take 7.5 mg by mouth daily.    . OXYBUTYNIN CHLORIDE ER 10 MG PO TB24 Oral Take 10 mg by mouth daily.    Marland Kitchen PRAVASTATIN SODIUM 80 MG PO TABS Oral Take 80 mg by mouth daily.      BP 122/71  Pulse 61  Temp 97.7 F (36.5 C) (Oral)  Resp 16  Ht 6\' 2"  (1.88 m)  Wt 166 lb (75.297 kg)  BMI 21.31 kg/m2  SpO2 100%  Physical Exam  Nursing note and vitals reviewed. Constitutional: He is oriented to person, place, and time. He appears well-developed and well-nourished.  HENT:  Head: Normocephalic and atraumatic.  Right Ear: External ear normal.  Left Ear: External ear normal.  Eyes: Conjunctivae and EOM are normal. Pupils are equal, round, and reactive to light.  Neck: Normal range of motion and phonation normal. Neck supple.  Cardiovascular: Normal rate, regular rhythm, normal heart sounds and intact distal pulses.   Pulmonary/Chest: Effort normal and breath sounds normal. He exhibits no bony tenderness.  Abdominal: Soft. Normal appearance. There is no tenderness.  Musculoskeletal: Normal range of motion.       There is a Band-Aid at the left T2 location. No localized swelling or tenderness  Neurological: He is alert and oriented to person, place, and time. He has  normal strength. No cranial nerve deficit or sensory deficit. He exhibits normal muscle tone. Coordination normal.  Skin: Skin is warm, dry and intact.  Psychiatric: He has a normal mood and affect. His behavior is normal. Judgment and thought content normal.    ED Course  Procedures (including critical care time)   Date: 06/17/2012  Rate: 51  Rhythm: normal sinus rhythm  QRS Axis: normal  Intervals: normal  ST/T Wave abnormalities: nonspecific TWA  Conduction Disutrbances:none  Narrative Interpretation:   Old EKG Reviewed: unchanged   Labs Reviewed - No data to display No results found.   1. Vasovagal episode       MDM  Apparent vasovagal episode secondary to painful  procedure. He is returned to baseline. Doubt ACS, pneumonia, metabolic instability, or occult infection. He is stable for discharge  Plan: Home Medications- usual; Home Treatments- rest; Recommended follow up- PCP prn        Flint Melter, MD 06/17/12 1145

## 2012-06-17 NOTE — Discharge Instructions (Signed)
Get plenty of rest, and drink a lot of fluids.  See your Dr. for problems.     Near-Syncope Near-syncope is sudden weakness, dizziness, or feeling like you might pass out (faint). This may occur when getting up after sitting or while standing for a long period of time. Near-syncope can be caused by a drop in blood pressure. This is a common reaction, but it may occur to a greater degree in people taking medicines to control their blood pressure. Fainting often occurs when the blood pressure or pulse is too low to provide enough blood flow to the brain to keep you conscious. Fainting and near-syncope are not usually due to serious medical problems. However, certain people should be more cautious in the event of near-syncope, including elderly patients, patients with diabetes, and patients with a history of heart conditions (especially irregular rhythms).  CAUSES   Drop in blood pressure.   Physical pain.   Dehydration.   Heat exhaustion.   Emotional distress.   Low blood sugar.   Internal bleeding.   Heart and circulatory problems.   Infections.  SYMPTOMS   Dizziness.   Feeling sick to your stomach (nauseous).   Nearly fainting.   Body numbness.   Turning pale.   Tunnel vision.   Weakness.  HOME CARE INSTRUCTIONS   Lie down right away if you start feeling like you might faint. Breathe deeply and steadily. Wait until all the symptoms have passed. Most of these episodes last only a few minutes. You may feel tired for several hours.   Drink enough fluids to keep your urine clear or pale yellow.   If you are taking blood pressure or heart medicine, get up slowly, taking several minutes to sit and then stand. This can reduce dizziness that is caused by a drop in blood pressure.  SEEK IMMEDIATE MEDICAL CARE IF:   You have a severe headache.   Unusual pain develops in the chest, abdomen, or back.   There is bleeding from the mouth or rectum, or you have black or tarry  stool.   An irregular heartbeat or a very rapid pulse develops.   You have repeated fainting or seizure-like jerking during an episode.   You faint when sitting or lying down.   You develop confusion.   You have difficulty walking.   Severe weakness develops.   Vision problems develop.  MAKE SURE YOU:   Understand these instructions.   Will watch your condition.   Will get help right away if you are not doing well or get worse.  Document Released: 12/07/2005 Document Revised: 11/26/2011 Document Reviewed: 01/23/2011 [facility] Patient Information 2012 [place], [place].

## 2012-12-20 ENCOUNTER — Encounter (HOSPITAL_COMMUNITY): Payer: Self-pay

## 2012-12-20 ENCOUNTER — Emergency Department (INDEPENDENT_AMBULATORY_CARE_PROVIDER_SITE_OTHER)
Admission: EM | Admit: 2012-12-20 | Discharge: 2012-12-20 | Disposition: A | Discharge status: Home / Self Care | Payer: Medicare Other | Source: Home / Self Care

## 2012-12-20 DIAGNOSIS — G40309 Generalized idiopathic epilepsy and epileptic syndromes, not intractable, without status epilepticus: Secondary | ICD-10-CM

## 2012-12-20 DIAGNOSIS — E119 Type 2 diabetes mellitus without complications: Secondary | ICD-10-CM

## 2012-12-20 DIAGNOSIS — I10 Essential (primary) hypertension: Secondary | ICD-10-CM

## 2012-12-20 LAB — CBC
MCH: 25.5 pg — ABNORMAL LOW (ref 26.0–34.0)
MCHC: 32.6 g/dL (ref 30.0–36.0)
Platelets: 312 10*3/uL (ref 150–400)
RDW: 18.3 % — ABNORMAL HIGH (ref 11.5–15.5)

## 2012-12-20 LAB — LIPID PANEL: Cholesterol: 194 mg/dL (ref 0–200)

## 2012-12-20 LAB — BASIC METABOLIC PANEL
BUN: 23 mg/dL (ref 6–23)
Calcium: 9.2 mg/dL (ref 8.4–10.5)
Creatinine, Ser: 0.98 mg/dL (ref 0.50–1.35)
GFR calc non Af Amer: 80 mL/min — ABNORMAL LOW (ref >90)
Glucose, Bld: 94 mg/dL (ref 70–99)
Sodium: 140 mEq/L (ref 135–145)

## 2012-12-20 MED ORDER — INFLUENZA VIRUS VACC SPLIT PF IM SUSP
0.5000 mL | Freq: Once | INTRAMUSCULAR | Status: AC
  Administered 2012-12-20: 0.5 mL via INTRAMUSCULAR

## 2012-12-20 MED ORDER — AMITRIPTYLINE HCL 50 MG PO TABS: 50.0000 mg | ORAL_TABLET | Freq: Every day | ORAL | Status: DC

## 2012-12-20 MED ORDER — HYDROCODONE-ACETAMINOPHEN 10-325 MG PO TABS: 1.0000 | ORAL_TABLET | Freq: Two times a day (BID) | ORAL | Status: DC

## 2012-12-20 MED ORDER — GABAPENTIN 600 MG PO TABS: 600.0000 mg | ORAL_TABLET | Freq: Two times a day (BID) | ORAL | Status: DC

## 2012-12-20 NOTE — ED Notes (Signed)
[facility]- in need of medication refill history of hypertension high cholesterol

## 2013-01-02 ENCOUNTER — Encounter (HOSPITAL_COMMUNITY): Payer: Self-pay

## 2013-01-02 ENCOUNTER — Emergency Department (INDEPENDENT_AMBULATORY_CARE_PROVIDER_SITE_OTHER)
Admission: EM | Admit: 2013-01-02 | Discharge: 2013-01-02 | Disposition: A | Discharge status: Home / Self Care | Payer: Medicare Other | Source: Home / Self Care

## 2013-01-02 DIAGNOSIS — F17200 Nicotine dependence, unspecified, uncomplicated: Secondary | ICD-10-CM

## 2013-01-02 DIAGNOSIS — D509 Iron deficiency anemia, unspecified: Secondary | ICD-10-CM

## 2013-01-02 DIAGNOSIS — J449 Chronic obstructive pulmonary disease, unspecified: Secondary | ICD-10-CM

## 2013-01-02 DIAGNOSIS — E7800 Pure hypercholesterolemia, unspecified: Secondary | ICD-10-CM

## 2013-01-02 DIAGNOSIS — J4489 Other specified chronic obstructive pulmonary disease: Secondary | ICD-10-CM

## 2013-01-02 DIAGNOSIS — I10 Essential (primary) hypertension: Secondary | ICD-10-CM

## 2013-01-02 DIAGNOSIS — K219 Gastro-esophageal reflux disease without esophagitis: Secondary | ICD-10-CM

## 2013-01-02 DIAGNOSIS — N138 Other obstructive and reflux uropathy: Secondary | ICD-10-CM

## 2013-01-02 DIAGNOSIS — G40309 Generalized idiopathic epilepsy and epileptic syndromes, not intractable, without status epilepticus: Secondary | ICD-10-CM

## 2013-01-02 DIAGNOSIS — N401 Enlarged prostate with lower urinary tract symptoms: Secondary | ICD-10-CM

## 2013-01-02 MED ORDER — HYDROCHLOROTHIAZIDE 12.5 MG PO TABS: 12.5000 mg | ORAL_TABLET | Freq: Every day | ORAL | Status: DC

## 2013-01-02 NOTE — ED Notes (Signed)
Follow up- history of HTN 

## 2013-01-02 NOTE — ED Provider Notes (Signed)
History   CSN: [id number]  Arrival date & time 01/02/13  1017  Chief Complaint  Patient presents with  . Follow-up   HPI The patient presents today to followup for his multiple medical problems.  He has been out of his medications for a short time.  He reports that he would like to have them refilled today.  He is not testing his blood glucose readings regularly.  He reports that he normally does take his medications when he has them.  He denies any new complaints.  Past Medical History  Diagnosis Date  . Scabies   . DIABETES MELLITUS, TYPE II, CONTROLLED   . HYPERCHOLESTEROLEMIA, MIXED   . ANEMIA, IRON DEFICIENCY NOS   . ERECTILE DYSFUNCTION   . CATARACTS, BILATERAL   . HYPERTENSION   . COPD   . G E R D   . DIVERTICULOSIS, COLON   . MILD SPINAL STENOSIS, CERVICAL   . DYSPHAGIA UNSPECIFIED   . SEIZURE, GRAND MAL   . BENIGN PROSTATIC HYPERTROPHY, WITH URINARY OBSTRUCTION   . HTN (hypertension)   . Mitral regurgitation   . Glaucoma(365)   . Depression     Past Surgical History  Procedure Date  . Knee arthroscopy   . Eye surgery   . Intracervical discectomy with fusion  c4-5 and c5-6--dr. cram     Family History  Problem Relation Age of Onset  . Diabetes    . Cancer      History  Substance Use Topics  . Smoking status: Current Every Day Smoker -- 1.0 packs/day  . Smokeless tobacco: Never Used  . Alcohol Use: No    Review of Systems  Constitutional: Negative.   HENT: Negative.   Eyes: Negative.   Respiratory: Negative.   Cardiovascular: Negative.   Gastrointestinal: Negative.   Musculoskeletal: Positive for arthralgias.  Neurological: Negative.   Hematological: Negative.   Psychiatric/Behavioral: Negative.   All other systems reviewed and are negative.   Allergies  Review of patient's allergies indicates no known allergies.  Home Medications   Current Outpatient Rx  Name  Route  Sig  Dispense  Refill  . AMITRIPTYLINE HCL 50 MG PO TABS   Oral  Take 1 tablet (50 mg total) by mouth at bedtime.   30 tablet   6   . BIMATOPROST 0.01 % OP SOLN   Both Eyes   Place 1 drop into both eyes at bedtime.         Elmer Sow OP   Right Eye   Place 1 drop into the right eye 2 (two) times daily.         . DULOXETINE HCL 60 MG PO CPEP   Oral   Take 60 mg by mouth 2 (two) times daily.          Marland Kitchen ESOMEPRAZOLE MAGNESIUM 40 MG PO CPDR   Oral   Take 40 mg by mouth 2 (two) times daily.         Marland Kitchen GABAPENTIN 600 MG PO TABS   Oral   Take 1 tablet (600 mg total) by mouth 2 (two) times daily.   60 tablet   6   . HYDROCODONE-ACETAMINOPHEN 10-325 MG PO TABS   Oral   Take 1 tablet by mouth every 12 (twelve) hours.   60 tablet   3   . LOSARTAN POTASSIUM 100 MG PO TABS   Oral   Take 100 mg by mouth daily.         Marland Kitchen PRAVASTATIN SODIUM  80 MG PO TABS   Oral   Take 80 mg by mouth daily.           BP 162/89  Pulse 71  Temp 98.5 F (36.9 C) (Oral)  Resp 19  SpO2 100%  Physical Exam  Nursing note and vitals reviewed. Constitutional: He is oriented to person, place, and time. He appears well-developed and well-nourished. No distress.  HENT:  Head: Normocephalic and atraumatic.  Eyes: EOM are normal. Pupils are equal, round, and reactive to light.  Neck: Normal range of motion. Neck supple. No JVD present. No thyromegaly present.  Cardiovascular: Normal rate and regular rhythm.   Pulmonary/Chest: Effort normal and breath sounds normal. No respiratory distress. He has no wheezes. He has no rales.  Abdominal: Soft. Bowel sounds are normal. He exhibits no distension and no mass. There is no tenderness. There is no rebound.  Musculoskeletal: Normal range of motion. He exhibits no edema and no tenderness.  Lymphadenopathy:    He has no cervical adenopathy.  Neurological: He is alert and oriented to person, place, and time. He has normal reflexes.  Skin: Skin is warm and dry. No rash noted. No erythema.  Psychiatric: He has a  normal mood and affect. His behavior is normal. Judgment and thought content normal.    ED Course  Procedures (including critical care time)  Labs Reviewed - No data to display No results found.  No diagnosis found.  MDM  IMPRESSION  HTN, suboptimally controlled  Hyperlipidemia  Tobacco Use  Severe OA  GERD  Chronic pain  RECOMMENDATIONS / PLAN Strongly encouraged tobacco cessation Discussed HTN Adding HCT 12.5 mg po daily Reviewed records with patient Discussed lab results with patient today.  FOLLOW UP 3 months to followup on hypertension and to recheck labs  The patient was given clear instructions to go to ER or return to medical center if symptoms don't improve, worsen or new problems develop.  The patient verbalized understanding.  The patient was told to call to get lab results if they haven't heard anything in the next week.            Cleora Fleet, MD 01/02/13 1432

## 2013-01-18 ENCOUNTER — Encounter (HOSPITAL_COMMUNITY): Payer: Self-pay | Admitting: Pharmacy Technician

## 2013-01-26 ENCOUNTER — Encounter (HOSPITAL_COMMUNITY)
Admission: RE | Disposition: A | Discharge status: Home / Self Care | Payer: Self-pay | Source: Ambulatory Visit | Attending: Ophthalmology

## 2013-01-26 ENCOUNTER — Other Ambulatory Visit: Payer: Self-pay | Admitting: Ophthalmology

## 2013-01-26 ENCOUNTER — Ambulatory Visit (HOSPITAL_COMMUNITY)
Admission: RE | Admit: 2013-01-26 | Discharge: 2013-01-26 | Disposition: A | Discharge status: Home / Self Care | Payer: Medicare Other | Source: Ambulatory Visit | Attending: Ophthalmology | Admitting: Ophthalmology

## 2013-01-26 ENCOUNTER — Encounter: Payer: Self-pay | Admitting: Ophthalmology

## 2013-01-26 DIAGNOSIS — H409 Unspecified glaucoma: Secondary | ICD-10-CM | POA: Insufficient documentation

## 2013-01-26 DIAGNOSIS — H4011X0 Primary open-angle glaucoma, stage unspecified: Secondary | ICD-10-CM | POA: Insufficient documentation

## 2013-01-26 DIAGNOSIS — H2640 Unspecified secondary cataract: Secondary | ICD-10-CM | POA: Insufficient documentation

## 2013-01-26 HISTORY — PX: YAG LASER APPLICATION: SHX6189

## 2013-01-26 HISTORY — PX: CAPSULOTOMY: SHX5412

## 2013-01-26 SURGERY — MINOR CAPSULOTOMY
Anesthesia: General | Site: Eye | Laterality: Right

## 2013-01-26 MED ORDER — APRACLONIDINE HCL 0.5 % OP SOLN
OPHTHALMIC | Status: AC
  Filled 2013-01-26: qty 5

## 2013-01-26 MED ORDER — APRACLONIDINE HCL 1 % OP SOLN
1.0000 [drp] | Freq: Once | OPHTHALMIC | Status: DC
  Filled 2013-01-26: qty 0.1

## 2013-01-26 MED ORDER — APRACLONIDINE HCL 1 % OP SOLN
1.0000 [drp] | Freq: Once | OPHTHALMIC | Status: AC
  Administered 2013-01-26 (×2): 1 [drp] via OPHTHALMIC
  Filled 2013-01-26: qty 0.1

## 2013-01-26 MED ORDER — CYCLOPENTOLATE-PHENYLEPHRINE 0.2-1 % OP SOLN
OPHTHALMIC | Status: AC
  Filled 2013-01-26: qty 2

## 2013-01-26 MED ORDER — CYCLOPENTOLATE-PHENYLEPHRINE 0.2-1 % OP SOLN
2.0000 [drp] | Freq: Once | OPHTHALMIC | Status: AC
  Administered 2013-01-26: 2 [drp] via OPHTHALMIC
  Filled 2013-01-26: qty 2

## 2013-01-26 SURGICAL SUPPLY — 28 items
APPLICATOR COTTON TIP 6IN STRL (MISCELLANEOUS) ×2 IMPLANT
BAG FLD CLT MN 6.25X3.5 (WOUND CARE)
BAG MINI COLL DRAIN (WOUND CARE) IMPLANT
BLADE KERATOME 2.75 (BLADE) ×2 IMPLANT
BLADE MINI RND TIP GREEN BEAV (BLADE) IMPLANT
CLOTH BEACON ORANGE TIMEOUT ST (SAFETY) ×2 IMPLANT
CORDS BIPOLAR (ELECTRODE) ×2 IMPLANT
DRAPE OPHTHALMIC 40X48 W POUCH (DRAPES) ×2 IMPLANT
DRAPE RETRACTOR (MISCELLANEOUS) ×2 IMPLANT
GLOVE ECLIPSE 7.0 STRL STRAW (GLOVE) ×2 IMPLANT
GOWN STRL NON-REIN LRG LVL3 (GOWN DISPOSABLE) ×4 IMPLANT
KIT BASIN OR (CUSTOM PROCEDURE TRAY) ×2 IMPLANT
KIT ROOM TURNOVER OR (KITS) ×2 IMPLANT
KNIFE CRESCENT 2.5 55 ANG (BLADE) ×2 IMPLANT
MARKER SKIN DUAL TIP RULER LAB (MISCELLANEOUS) IMPLANT
NS IRRIG 1000ML POUR BTL (IV SOLUTION) ×2 IMPLANT
PACK CATARACT CUSTOM (CUSTOM PROCEDURE TRAY) ×2 IMPLANT
PACK CATARACT MCHSCP (PACKS) IMPLANT
PAD ARMBOARD 7.5X6 YLW CONV (MISCELLANEOUS) ×4 IMPLANT
PROBE ANTERIOR 20G W/INFUS NDL (MISCELLANEOUS) IMPLANT
SPEAR EYE SURG WECK-CEL (MISCELLANEOUS) IMPLANT
SUT ETHILON 10 0 CS140 6 (SUTURE) IMPLANT
SUT VICRYL 8 0 TG140 8 (SUTURE) IMPLANT
SYR 3ML LL SCALE MARK (SYRINGE) IMPLANT
TIP SILICONE STR (MISCELLANEOUS)
TIP SILICONE STR 0.3MM UFLOW (MISCELLANEOUS) IMPLANT
TOWEL OR 17X24 6PK STRL BLUE (TOWEL DISPOSABLE) ×4 IMPLANT
WATER STERILE IRR 1000ML POUR (IV SOLUTION) ×2 IMPLANT

## 2013-01-26 NOTE — Brief Op Note (Signed)
01/26/2013  7:38 AM  PATIENT:  Russell [name]  72 y.o. male  PRE-OPERATIVE DIAGNOSIS:  Opacque Posterior Capsule Right Eye  POST-OPERATIVE DIAGNOSIS:  * No post-op diagnosis entered *  PROCEDURE:  Procedure(s) (LRB) with comments: MINOR CAPSULOTOMY (Right) YAG LASER APPLICATION (Right)  Total Energy 33 Total Bursts  11 Shots/Burst   1 Energy/Shot 3.0  SURGEON:  Surgeon(s) and Role:    Vita Erm., MD - Primary  PHYSICIAN ASSISTANT:   ASSISTANTS: none   ANESTHESIA:   none  EBL:     BLOOD ADMINISTERED:none  DRAINS: none   LOCAL MEDICATIONS USED:  NONE  SPECIMEN:  No Specimen  DISPOSITION OF SPECIMEN:  N/A  COUNTS:  YES  TOURNIQUET:  * No tourniquets in log *  DICTATION: .Other Dictation: Dictation Number [id number]  PLAN OF CARE: Discharge to home after PACU  PATIENT DISPOSITION:  Short Stay   Delay start of Pharmacological VTE agent (>24hrs) due to surgical blood loss or risk of bleeding: not applicable

## 2013-01-26 NOTE — H&P (Signed)
  72 yo male has had cataract surgery right eye.  Now complains of blurred vision due to a cloudy posterior capsule.  Admtited for a yag laser capsulotomy right eye.

## 2013-01-26 NOTE — H&P (Signed)
  72 yo male has had cataract surgery right eye.  Now has blurred vision.  Has cloudy posterior capsule.  Admitted for yag laser capsulotomy

## 2013-01-27 NOTE — Op Note (Signed)
NAME:  Russell [name], Russell [name] NO.:  [id number]  MEDICAL RECORD NO.:  [id number]  LOCATION:  [facility]                         FACILITY:  [facility]  PHYSICIAN:  Salley Scarlet., M.D.DATE OF BIRTH:  01-13-1941  DATE OF PROCEDURE: DATE OF DISCHARGE:  01/26/2013                              OPERATIVE REPORT   PREOPERATIVE DIAGNOSIS:  Opaque posterior capsule, right eye.  POSTOPERATIVE DIAGNOSIS:  Opaque posterior capsule, right eye.  OPERATION:  YAG laser capsulotomy, right eye.  JUSTIFICATION FOR PROCEDURE:  This is a 72 year old gentleman, who complains of blurring of vision of the right eye.  He has had a cataract extracted from the right eye and now has a cloudy posterior capsule. YAG laser capsulotomy was recommended.  He is admitted at this time for the proposed procedure.  OPERATIVE PROCEDURE:  The patient was brought to the laser room and positioned appropriately behind the YAG laser.  Approximately 11 applications of laser energy of 3 megajoule per burst were applied to the posterior capsule obtaining a satisfactory opening in the posterior capsule.  The patient tolerated the procedure well and was discharged to the postanesthesia recovery room in satisfactory condition.  He is instructed to see me in office this afternoon at 5 p.m.     Salley Scarlet., M.D.     TB/MEDQ  D:  01/27/2013  T:  01/27/2013  Job:  [id number]

## 2013-01-30 ENCOUNTER — Encounter (HOSPITAL_COMMUNITY): Payer: Self-pay | Admitting: Ophthalmology

## 2013-02-03 ENCOUNTER — Encounter (HOSPITAL_COMMUNITY): Payer: Self-pay | Admitting: Emergency Medicine

## 2013-02-03 ENCOUNTER — Emergency Department (HOSPITAL_COMMUNITY): Payer: Medicare Other

## 2013-02-03 ENCOUNTER — Emergency Department (HOSPITAL_COMMUNITY)
Admission: EM | Admit: 2013-02-03 | Discharge: 2013-02-03 | Disposition: A | Discharge status: Home / Self Care | Payer: Medicare Other | Attending: Emergency Medicine | Admitting: Emergency Medicine

## 2013-02-03 DIAGNOSIS — F17200 Nicotine dependence, unspecified, uncomplicated: Secondary | ICD-10-CM | POA: Insufficient documentation

## 2013-02-03 DIAGNOSIS — Z8619 Personal history of other infectious and parasitic diseases: Secondary | ICD-10-CM | POA: Insufficient documentation

## 2013-02-03 DIAGNOSIS — K219 Gastro-esophageal reflux disease without esophagitis: Secondary | ICD-10-CM | POA: Insufficient documentation

## 2013-02-03 DIAGNOSIS — F329 Major depressive disorder, single episode, unspecified: Secondary | ICD-10-CM | POA: Insufficient documentation

## 2013-02-03 DIAGNOSIS — Z87448 Personal history of other diseases of urinary system: Secondary | ICD-10-CM | POA: Insufficient documentation

## 2013-02-03 DIAGNOSIS — Z8739 Personal history of other diseases of the musculoskeletal system and connective tissue: Secondary | ICD-10-CM | POA: Insufficient documentation

## 2013-02-03 DIAGNOSIS — I951 Orthostatic hypotension: Secondary | ICD-10-CM | POA: Insufficient documentation

## 2013-02-03 DIAGNOSIS — Z8679 Personal history of other diseases of the circulatory system: Secondary | ICD-10-CM | POA: Insufficient documentation

## 2013-02-03 DIAGNOSIS — J4489 Other specified chronic obstructive pulmonary disease: Secondary | ICD-10-CM | POA: Insufficient documentation

## 2013-02-03 DIAGNOSIS — F3289 Other specified depressive episodes: Secondary | ICD-10-CM | POA: Insufficient documentation

## 2013-02-03 DIAGNOSIS — I10 Essential (primary) hypertension: Secondary | ICD-10-CM | POA: Insufficient documentation

## 2013-02-03 DIAGNOSIS — R42 Dizziness and giddiness: Secondary | ICD-10-CM | POA: Insufficient documentation

## 2013-02-03 DIAGNOSIS — Z862 Personal history of diseases of the blood and blood-forming organs and certain disorders involving the immune mechanism: Secondary | ICD-10-CM | POA: Insufficient documentation

## 2013-02-03 DIAGNOSIS — J449 Chronic obstructive pulmonary disease, unspecified: Secondary | ICD-10-CM | POA: Insufficient documentation

## 2013-02-03 DIAGNOSIS — R110 Nausea: Secondary | ICD-10-CM | POA: Insufficient documentation

## 2013-02-03 DIAGNOSIS — Z8719 Personal history of other diseases of the digestive system: Secondary | ICD-10-CM | POA: Insufficient documentation

## 2013-02-03 DIAGNOSIS — H409 Unspecified glaucoma: Secondary | ICD-10-CM | POA: Insufficient documentation

## 2013-02-03 DIAGNOSIS — E782 Mixed hyperlipidemia: Secondary | ICD-10-CM | POA: Insufficient documentation

## 2013-02-03 DIAGNOSIS — Z79899 Other long term (current) drug therapy: Secondary | ICD-10-CM | POA: Insufficient documentation

## 2013-02-03 DIAGNOSIS — E119 Type 2 diabetes mellitus without complications: Secondary | ICD-10-CM | POA: Insufficient documentation

## 2013-02-03 DIAGNOSIS — Z8669 Personal history of other diseases of the nervous system and sense organs: Secondary | ICD-10-CM | POA: Insufficient documentation

## 2013-02-03 LAB — BASIC METABOLIC PANEL
CO2: 27 mEq/L (ref 19–32)
Calcium: 9.4 mg/dL (ref 8.4–10.5)
Creatinine, Ser: 1.02 mg/dL (ref 0.50–1.35)
GFR calc Af Amer: 83 mL/min — ABNORMAL LOW (ref >90)
GFR calc non Af Amer: 71 mL/min — ABNORMAL LOW (ref >90)
Sodium: 137 mEq/L (ref 135–145)

## 2013-02-03 LAB — CBC WITH DIFFERENTIAL/PLATELET
Basophils Absolute: 0.1 10*3/uL (ref 0.0–0.1)
Eosinophils Relative: 3 % (ref 0–5)
Lymphocytes Relative: 28 % (ref 12–46)
Lymphs Abs: 2.5 10*3/uL (ref 0.7–4.0)
Neutro Abs: 5.5 10*3/uL (ref 1.7–7.7)
Neutrophils Relative %: 62 % (ref 43–77)
Platelets: 280 10*3/uL (ref 150–400)
RBC: 4.53 MIL/uL (ref 4.22–5.81)
RDW: 18.5 % — ABNORMAL HIGH (ref 11.5–15.5)
WBC: 8.8 10*3/uL (ref 4.0–10.5)

## 2013-02-03 LAB — POCT I-STAT TROPONIN I: Troponin i, poc: 0 ng/mL (ref 0.00–0.08)

## 2013-02-03 MED ORDER — FERROUS SULFATE 325 (65 FE) MG PO TABS: 325.0000 mg | ORAL_TABLET | Freq: Every day | ORAL | Status: DC

## 2013-02-03 NOTE — ED Notes (Signed)
Pt states that he started to feel dizzy yesterday; Pt states he was in living room and got up to go into kitchen and felt dizzy; pt states last time this happened his blood was low; pt states he thinks his hands look pale in color; pt denies numbness/tingling; pt states feeling nauseous. Pt states that he is dizzy and has a headache across forehead. Pt alert and mentating appropriately. Pt denies blood in stool.

## 2013-02-03 NOTE — ED Notes (Signed)
Pt states he got dizzy when he stood from sitting position.

## 2013-02-03 NOTE — ED Notes (Signed)
Pt ambulatory leaving ED with family. Pt offered wheelchair out and refused. Pt given d/c teaching and prescription teaching for taking iron supplements. Pt verbalizes understanding of d/c teaching and prescriptions. Pt has no further questions upon d/c teaching. Pt alert and mentating appropriately upon d/c. Pt does not show signs of acute distress upon d/c. Pt leaving with d/c teaching and prescription.

## 2013-02-03 NOTE — ED Provider Notes (Signed)
History     CSN: [id number]  Arrival date & time 02/03/13  1401   First MD Initiated Contact with Patient 02/03/13 1412      Chief Complaint  Patient presents with  . Dizziness    (Consider location/radiation/quality/duration/timing/severity/associated sxs/prior treatment) HPI Comments: This is a 72 year old male, who presents emergency department with chief complaint of dizziness. Patient states the symptoms began yesterday. He states that this has happened before, and that it was due to a low blood pressure. Patient states that he is been taking his blood pressure medications as prescribed. He denies any pain, specifically chest pain, denies shortness of breath, vomiting, constipation, diarrhea. He denies any weakness, numbness and tingling of his extremities. He endorses a little bit of nausea, and states that he is still slightly dizzy. He describes the dizziness as though he is going to fall out. He states that it does not seem like the room is spinning, nor does he feel unbalanced or unsteady on his feet.  The history is provided by the patient. No language interpreter was used.    Past Medical History  Diagnosis Date  . Scabies   . DIABETES MELLITUS, TYPE II, CONTROLLED   . HYPERCHOLESTEROLEMIA, MIXED   . ANEMIA, IRON DEFICIENCY NOS   . ERECTILE DYSFUNCTION   . CATARACTS, BILATERAL   . HYPERTENSION   . COPD   . G E R D   . DIVERTICULOSIS, COLON   . MILD SPINAL STENOSIS, CERVICAL   . DYSPHAGIA UNSPECIFIED   . SEIZURE, GRAND MAL   . BENIGN PROSTATIC HYPERTROPHY, WITH URINARY OBSTRUCTION   . HTN (hypertension)   . Mitral regurgitation   . Glaucoma(365)   . Depression     Past Surgical History  Procedure Laterality Date  . Knee arthroscopy    . Eye surgery    . Intracervical discectomy with fusion  c4-5 and c5-6--dr. cram    . Capsulotomy Right 01/26/2013    Procedure: MINOR CAPSULOTOMY;  Surgeon: Vita Erm., MD;  Location: [facility] OR;  Service: Ophthalmology;   Laterality: Right;  . Yag laser application Right 01/26/2013    Procedure: YAG LASER APPLICATION;  Surgeon: Vita Erm., MD;  Location: [facility] OR;  Service: Ophthalmology;  Laterality: Right;    Family History  Problem Relation Age of Onset  . Diabetes    . Cancer      History  Substance Use Topics  . Smoking status: Current Every Day Smoker -- 1.00 packs/day  . Smokeless tobacco: Never Used  . Alcohol Use: No      Review of Systems  All other systems reviewed and are negative.    Allergies  Review of patient's allergies indicates no known allergies.  Home Medications   Current Outpatient Rx  Name  Route  Sig  Dispense  Refill  . amitriptyline (ELAVIL) 10 MG tablet   Oral   Take 10 mg by mouth at bedtime as needed for sleep.         . bimatoprost (LUMIGAN) 0.01 % SOLN   Both Eyes   Place 1 drop into both eyes at bedtime.         . Brimonidine Tartrate-Timolol (COMBIGAN OP)   Right Eye   Place 1 drop into the right eye 2 (two) times daily.         . ciprofloxacin (CIPRO) 500 MG tablet   Oral   Take 500 mg by mouth 2 (two) times daily.         Marland Kitchen  DULoxetine (CYMBALTA) 60 MG capsule   Oral   Take 60 mg by mouth 2 (two) times daily.         Marland Kitchen esomeprazole (NEXIUM) 40 MG capsule   Oral   Take 40 mg by mouth 2 (two) times daily.         Marland Kitchen gabapentin (NEURONTIN) 600 MG tablet   Oral   Take 600 mg by mouth 2 (two) times daily.         Marland Kitchen HYDROcodone-acetaminophen (NORCO) 10-325 MG per tablet   Oral   Take 1 tablet by mouth every 12 (twelve) hours.         Marland Kitchen losartan (COZAAR) 100 MG tablet   Oral   Take 100 mg by mouth daily.         . meloxicam (MOBIC) 7.5 MG tablet   Oral   Take 7.5 mg by mouth daily.         Marland Kitchen oxybutynin (DITROPAN-XL) 10 MG 24 hr tablet   Oral   Take 10 mg by mouth at bedtime.         . pravastatin (PRAVACHOL) 80 MG tablet   Oral   Take 80 mg by mouth daily.           BP 162/85  Pulse 62   Temp(Src) 98.3 F (36.8 C) (Oral)  Resp 17  SpO2 99%  Physical Exam  Nursing note and vitals reviewed. Constitutional: He is oriented to person, place, and time. He appears well-developed and well-nourished.  HENT:  Head: Normocephalic and atraumatic.  Nose: Nose normal.  Mouth/Throat: Oropharynx is clear and moist. No oropharyngeal exudate.  Eyes: Conjunctivae and EOM are normal. Pupils are equal, round, and reactive to light. Right eye exhibits no discharge. Left eye exhibits no discharge. No scleral icterus.  Neck: Normal range of motion. Neck supple. No JVD present.  Cardiovascular: Normal rate, regular rhythm, normal heart sounds and intact distal pulses.  Exam reveals no gallop and no friction rub.   No murmur heard. Pulmonary/Chest: Effort normal and breath sounds normal. No respiratory distress. He has no wheezes. He has no rales. He exhibits no tenderness.  Abdominal: Soft. Bowel sounds are normal. He exhibits no distension and no mass. There is no tenderness. There is no rebound and no guarding.  Musculoskeletal: Normal range of motion. He exhibits no edema and no tenderness.  Neurological: He is alert and oriented to person, place, and time. He has normal reflexes.  CN 3-12 intact  Skin: Skin is warm and dry.  Psychiatric: He has a normal mood and affect. His behavior is normal. Judgment and thought content normal.    ED Course  Procedures (including critical care time)  Labs Reviewed  BASIC METABOLIC PANEL  CBC WITH DIFFERENTIAL   No results found.  ED ECG REPORT  I personally interpreted this EKG   Date: 02/03/2013   Rate: 58  Rhythm: normal sinus rhythm  QRS Axis: normal  Intervals: normal  ST/T Wave abnormalities: nonspecific T wave changes  Conduction Disutrbances:none  Narrative Interpretation: LVH  Old EKG Reviewed: unchanged  4:51 PM FOBT is NEGATIVE  1. Orthostasis   2. Dizziness       MDM  72 year old male with dizziness. Will obtain basic  labs, check orthostatic vital signs, and EKG. Patient is not in any distress at this time. He states that he has had similar episodes in the past, was do to low blood pressure.  4:49 PM Patient seen by and discussed with Dr.  Kohut.  We have reviewed the labs together.  The patient's only symptoms remain the dizziness, which he says comes on when he stands up.  Dr. Juleen China agrees that this is likely Orthostasis.  The patient's labs and workup have been unremarkable today.  I will have the patient follow-up with his PCP.  The patient understands and agrees with the plan.  I will have the patient start an iron supplement.       Roxy Horseman, PA-C 02/03/13 1652

## 2013-02-03 NOTE — ED Notes (Signed)
Roxy Horseman, PA dropped occult card off at mini lab

## 2013-02-03 NOTE — ED Notes (Signed)
Per EMS; pt c/o dizziness that started a couple days ago. Pt has hx HTN and is hypertensive en route; pt stroke scale negative; pt denies headache and numbness; pt from home. VS 180/96 HR 60 16 RR CBH 89.

## 2013-02-03 NOTE — ED Notes (Signed)
Rob Browning, PA at bedside  

## 2013-02-06 ENCOUNTER — Encounter (HOSPITAL_COMMUNITY): Payer: Self-pay

## 2013-02-06 ENCOUNTER — Emergency Department (INDEPENDENT_AMBULATORY_CARE_PROVIDER_SITE_OTHER)
Admission: EM | Admit: 2013-02-06 | Discharge: 2013-02-06 | Disposition: A | Discharge status: Home / Self Care | Payer: Medicare Other | Source: Home / Self Care | Attending: Family Medicine | Admitting: Family Medicine

## 2013-02-06 DIAGNOSIS — R110 Nausea: Secondary | ICD-10-CM

## 2013-02-06 DIAGNOSIS — E860 Dehydration: Secondary | ICD-10-CM

## 2013-02-06 DIAGNOSIS — I10 Essential (primary) hypertension: Secondary | ICD-10-CM

## 2013-02-06 DIAGNOSIS — R42 Dizziness and giddiness: Secondary | ICD-10-CM

## 2013-02-06 LAB — OCCULT BLOOD, POC DEVICE: Fecal Occult Bld: NEGATIVE

## 2013-02-06 MED ORDER — MECLIZINE HCL 12.5 MG PO TABS: 12.5000 mg | ORAL_TABLET | Freq: Three times a day (TID) | ORAL | Status: DC | PRN

## 2013-02-06 NOTE — ED Provider Notes (Signed)
History     CSN: [id number]  Arrival date & time 02/06/13  1724   First MD Initiated Contact with Patient 02/06/13 1846      Chief Complaint  Patient presents with  . Emesis    (Consider location/radiation/quality/duration/timing/severity/associated sxs/prior treatment) HPI Patient presented today to followup from her recent visit to the emergency department where he was diagnosed with vertigo.  The patient reports that he was evaluated and found to be dehydrated.  He says that for the past several days he's had dizziness.  He reports that in the emergency department they evaluated him with orthostatics.  He also did some studies on him and some blood work.  The patient was sent home.  The patient reports he continues to have dizziness at this time.  He reports that he is noticing some improvement but he still having nausea and feeling like the room is spinning at times when he gets up from a seated position.  He reports that he has had this condition in the past.  It was about one year ago.  He says that it was associated with a cold.  He reports that he has had a similar cold recently this year and symptoms have returned.  The patient reports that he's been eating and drinking well over the past 24 hours.  He's having no vomiting.  He is still nauseated.  He still feels a sensation of the room spinning around.  Past Medical History  Diagnosis Date  . Scabies   . DIABETES MELLITUS, TYPE II, CONTROLLED   . HYPERCHOLESTEROLEMIA, MIXED   . ANEMIA, IRON DEFICIENCY NOS   . ERECTILE DYSFUNCTION   . CATARACTS, BILATERAL   . HYPERTENSION   . COPD   . G E R D   . DIVERTICULOSIS, COLON   . MILD SPINAL STENOSIS, CERVICAL   . DYSPHAGIA UNSPECIFIED   . SEIZURE, GRAND MAL   . BENIGN PROSTATIC HYPERTROPHY, WITH URINARY OBSTRUCTION   . HTN (hypertension)   . Mitral regurgitation   . Glaucoma(365)   . Depression     Past Surgical History  Procedure Laterality Date  . Knee arthroscopy    .  Eye surgery    . Intracervical discectomy with fusion  c4-5 and c5-6--dr. cram    . Capsulotomy Right 01/26/2013    Procedure: MINOR CAPSULOTOMY;  Surgeon: Vita Erm., MD;  Location: [facility] OR;  Service: Ophthalmology;  Laterality: Right;  . Yag laser application Right 01/26/2013    Procedure: YAG LASER APPLICATION;  Surgeon: Vita Erm., MD;  Location: [facility] OR;  Service: Ophthalmology;  Laterality: Right;    Family History  Problem Relation Age of Onset  . Diabetes    . Cancer      History  Substance Use Topics  . Smoking status: Current Every Day Smoker -- 1.00 packs/day  . Smokeless tobacco: Never Used  . Alcohol Use: No    Review of Systems  HENT: Positive for congestion, rhinorrhea and postnasal drip.   Gastrointestinal: Positive for nausea. Negative for vomiting, diarrhea, constipation and blood in stool.  Neurological: Positive for dizziness and light-headedness.  All other systems reviewed and are negative.    Allergies  Review of patient's allergies indicates no known allergies.  Home Medications   Current Outpatient Rx  Name  Route  Sig  Dispense  Refill  . amitriptyline (ELAVIL) 10 MG tablet   Oral   Take 10 mg by mouth at bedtime as needed for sleep.         Marland Kitchen  bimatoprost (LUMIGAN) 0.01 % SOLN   Both Eyes   Place 1 drop into both eyes at bedtime.         . Brimonidine Tartrate-Timolol (COMBIGAN OP)   Right Eye   Place 1 drop into the right eye 2 (two) times daily.         . ciprofloxacin (CIPRO) 500 MG tablet   Oral   Take 500 mg by mouth 2 (two) times daily.         . DULoxetine (CYMBALTA) 60 MG capsule   Oral   Take 60 mg by mouth 2 (two) times daily.         Marland Kitchen esomeprazole (NEXIUM) 40 MG capsule   Oral   Take 40 mg by mouth 2 (two) times daily.         . ferrous sulfate 325 (65 FE) MG tablet   Oral   Take 1 tablet (325 mg total) by mouth daily.   30 tablet   0   . gabapentin (NEURONTIN) 600 MG tablet   Oral    Take 600 mg by mouth 2 (two) times daily.         Marland Kitchen HYDROcodone-acetaminophen (NORCO) 10-325 MG per tablet   Oral   Take 1 tablet by mouth every 12 (twelve) hours.         Marland Kitchen losartan (COZAAR) 100 MG tablet   Oral   Take 100 mg by mouth daily.         . meclizine (ANTIVERT) 12.5 MG tablet   Oral   Take 1 tablet (12.5 mg total) by mouth 3 (three) times daily as needed for dizziness or nausea.   15 tablet   0   . meloxicam (MOBIC) 7.5 MG tablet   Oral   Take 7.5 mg by mouth daily.         Marland Kitchen oxybutynin (DITROPAN-XL) 10 MG 24 hr tablet   Oral   Take 10 mg by mouth at bedtime.         . pravastatin (PRAVACHOL) 80 MG tablet   Oral   Take 80 mg by mouth daily.           BP 144/95  Pulse 61  Temp(Src) 97.8 F (36.6 C) (Oral)  SpO2 100%  Physical Exam  Nursing note and vitals reviewed. Constitutional: He is oriented to person, place, and time. He appears well-developed and well-nourished. No distress.  HENT:  Head: Normocephalic and atraumatic.  Right Ear: Hearing, tympanic membrane, external ear and ear canal normal.  Left Ear: Hearing, tympanic membrane, external ear and ear canal normal.  Nose: Mucosal edema and rhinorrhea present.  Eyes: Conjunctivae and EOM are normal. Pupils are equal, round, and reactive to light. Right eye exhibits no discharge. Left eye exhibits no discharge.  Neck: Normal range of motion. Neck supple. No JVD present. No tracheal deviation present. No thyromegaly present.  Cardiovascular: Normal rate, regular rhythm and normal heart sounds.  Exam reveals no gallop and no friction rub.   No murmur heard. Pulmonary/Chest: Effort normal and breath sounds normal. No respiratory distress. He has no wheezes. He has no rales. He exhibits no tenderness.  Abdominal: Soft. Bowel sounds are normal. He exhibits no distension and no mass. There is no tenderness. There is no rebound and no guarding.  Musculoskeletal: Normal range of motion. He exhibits  no edema and no tenderness.  Lymphadenopathy:    He has no cervical adenopathy.  Neurological: He is alert and oriented to person, place, and  time. He displays normal reflexes. No cranial nerve deficit. He exhibits normal muscle tone. Coordination normal.  Skin: Skin is warm and dry. No rash noted. No erythema. No pallor.  Psychiatric: He has a normal mood and affect. His behavior is normal. Judgment and thought content normal.    ED Course  Procedures (including critical care time)  Labs Reviewed - No data to display No results found.  1. Nausea   2. Vertigo   3. Dehydration   4. Hypertension, suboptimally controlled but improving from previous visit    MDM  RECOMMENDATIONS / PLAN Encourage extra non-caffeinated low sugar fluids, rest and fall precautions strongly advised Meclizine low-dose 12.5 mg by mouth q. 8 hours when necessary for symptoms of nausea and vertigo Encouraged take blood pressure medications regularly as prescribed and monitor blood pressure at home Go to the ER if symptoms worsen or if new changes occur: The patient verbalized clear understanding to these instructions  FOLLOW UP 1 week for recheck  The patient was given clear instructions to go to ER or return to medical center if symptoms don't improve, worsen or new problems develop.  The patient verbalized understanding.  The patient was told to call to get lab results if they haven't heard anything in the next week.            Cleora Fleet, MD 02/06/13 2043

## 2013-02-06 NOTE — ED Notes (Signed)
Complain of vomiting and aches since last Thursday Went by ambulance to [place] on Friday Still does not feel well-dizzy and body aches

## 2013-02-06 NOTE — ED Provider Notes (Signed)
Medical screening examination/treatment/procedure(s) were conducted as a shared visit with non-physician practitioner(s) and myself.  I personally evaluated the patient during the encounter.  71yM with dizziness. May be some element of orthostasis as seems to be symptomatic with changes in position. No cp. EKG with LVH and NS repolarization abnormalities. Nonfocal neuro exam. W/u reassuring. I feel safe for DC.     Raeford Razor, MD 02/06/13 [phone]

## 2013-04-10 ENCOUNTER — Emergency Department (INDEPENDENT_AMBULATORY_CARE_PROVIDER_SITE_OTHER)
Admission: EM | Admit: 2013-04-10 | Discharge: 2013-04-10 | Disposition: A | Discharge status: Home Health | Payer: Medicare Other | Source: Home / Self Care

## 2013-04-10 ENCOUNTER — Encounter (HOSPITAL_COMMUNITY): Payer: Self-pay | Admitting: *Deleted

## 2013-04-10 DIAGNOSIS — J449 Chronic obstructive pulmonary disease, unspecified: Secondary | ICD-10-CM

## 2013-04-10 DIAGNOSIS — F17200 Nicotine dependence, unspecified, uncomplicated: Secondary | ICD-10-CM

## 2013-04-10 DIAGNOSIS — H8110 Benign paroxysmal vertigo, unspecified ear: Secondary | ICD-10-CM

## 2013-04-10 DIAGNOSIS — I10 Essential (primary) hypertension: Secondary | ICD-10-CM

## 2013-04-10 MED ORDER — MECLIZINE HCL 12.5 MG PO TABS: 25.0000 mg | ORAL_TABLET | Freq: Three times a day (TID) | ORAL | Status: DC | PRN

## 2013-04-10 MED ORDER — LOSARTAN POTASSIUM 100 MG PO TABS: 100.0000 mg | ORAL_TABLET | Freq: Every day | ORAL | Status: DC

## 2013-04-10 MED ORDER — PRAVASTATIN SODIUM 80 MG PO TABS: 80.0000 mg | ORAL_TABLET | Freq: Every day | ORAL | Status: DC

## 2013-04-10 MED ORDER — BIMATOPROST 0.01 % OP SOLN: 1.0000 [drp] | Freq: Every day | OPHTHALMIC | Status: AC

## 2013-04-10 MED ORDER — FERROUS SULFATE 325 (65 FE) MG PO TABS: 325.0000 mg | ORAL_TABLET | Freq: Every day | ORAL | Status: DC

## 2013-04-10 MED ORDER — COMBIGAN OP: 1.0000 [drp] | Freq: Two times a day (BID) | OPHTHALMIC | Status: DC

## 2013-04-10 MED ORDER — GABAPENTIN 600 MG PO TABS: 600.0000 mg | ORAL_TABLET | Freq: Two times a day (BID) | ORAL | Status: DC

## 2013-04-10 MED ORDER — ALBUTEROL SULFATE HFA 108 (90 BASE) MCG/ACT IN AERS: 2.0000 | INHALATION_SPRAY | Freq: Four times a day (QID) | RESPIRATORY_TRACT | Status: DC | PRN

## 2013-04-10 MED ORDER — AMITRIPTYLINE HCL 10 MG PO TABS: 10.0000 mg | ORAL_TABLET | Freq: Every evening | ORAL | Status: DC | PRN

## 2013-04-10 MED ORDER — OXYBUTYNIN CHLORIDE ER 10 MG PO TB24: 10.0000 mg | ORAL_TABLET | Freq: Every day | ORAL | Status: DC

## 2013-04-10 MED ORDER — ESOMEPRAZOLE MAGNESIUM 40 MG PO CPDR: 40.0000 mg | DELAYED_RELEASE_CAPSULE | Freq: Two times a day (BID) | ORAL | Status: DC

## 2013-04-10 NOTE — ED Provider Notes (Signed)
History     CSN: [id number]  Arrival date & time 04/10/13  1607   First MD Initiated Contact with Patient 04/10/13 1657      Chief Complaint  Patient presents with  . Follow-up   medication refill  HPI This 72 year old male wit history of COPD, GERD, hypertension, hyperlipidemia who presents for medication refill. Patient denies any headache, blurry vision, fever, chills, chest pain, palpitations, shortness of breath, bone pain, nausea, vomiting, bowel or urinary symptoms. She does complain of some cough with whitish phlegm that has been ongoing for several months and unchanged. He continues to smoke heavily and refuses to quit at this time. He recently had cataract surgery of his right eye. He does complain of persistent vertigo on standing and trying to ambulate off and on. Is prescribed a course of meclizine on his last visit we did help with the symptoms. Past Medical History  Diagnosis Date  . Scabies   . DIABETES MELLITUS, TYPE II, CONTROLLED   . HYPERCHOLESTEROLEMIA, MIXED   . ANEMIA, IRON DEFICIENCY NOS   . ERECTILE DYSFUNCTION   . CATARACTS, BILATERAL   . HYPERTENSION   . COPD   . G E R D   . DIVERTICULOSIS, COLON   . MILD SPINAL STENOSIS, CERVICAL   . DYSPHAGIA UNSPECIFIED   . SEIZURE, GRAND MAL   . BENIGN PROSTATIC HYPERTROPHY, WITH URINARY OBSTRUCTION   . HTN (hypertension)   . Mitral regurgitation   . Glaucoma(365)   . Depression     Past Surgical History  Procedure Laterality Date  . Knee arthroscopy    . Eye surgery    . Intracervical discectomy with fusion  c4-5 and c5-6--dr. cram    . Capsulotomy Right 01/26/2013    Procedure: MINOR CAPSULOTOMY;  Surgeon: Vita Erm., MD;  Location: [facility] OR;  Service: Ophthalmology;  Laterality: Right;  . Yag laser application Right 01/26/2013    Procedure: YAG LASER APPLICATION;  Surgeon: Vita Erm., MD;  Location: [facility] OR;  Service: Ophthalmology;  Laterality: Right;    Family History  Problem  Relation Age of Onset  . Diabetes    . Cancer      History  Substance Use Topics  . Smoking status: Current Every Day Smoker -- 1.00 packs/day  . Smokeless tobacco: Never Used  . Alcohol Use: No      Review of Systems As outlined in history of present illness Allergies  Review of patient's allergies indicates no known allergies.  Home Medications   Current Outpatient Rx  Name  Route  Sig  Dispense  Refill  . amitriptyline (ELAVIL) 10 MG tablet   Oral   Take 10 mg by mouth at bedtime as needed for sleep.         . bimatoprost (LUMIGAN) 0.01 % SOLN   Both Eyes   Place 1 drop into both eyes at bedtime.         . Brimonidine Tartrate-Timolol (COMBIGAN OP)   Right Eye   Place 1 drop into the right eye 2 (two) times daily.         Marland Kitchen esomeprazole (NEXIUM) 40 MG capsule   Oral   Take 40 mg by mouth 2 (two) times daily.         . ferrous sulfate 325 (65 FE) MG tablet   Oral   Take 1 tablet (325 mg total) by mouth daily.   30 tablet   0   . gabapentin (NEURONTIN) 600 MG tablet  Oral   Take 600 mg by mouth 2 (two) times daily.         Marland Kitchen HYDROcodone-acetaminophen (NORCO) 10-325 MG per tablet   Oral   Take 1 tablet by mouth every 12 (twelve) hours.         Marland Kitchen losartan (COZAAR) 100 MG tablet   Oral   Take 100 mg by mouth daily.         . meclizine (ANTIVERT) 12.5 MG tablet   Oral   Take 1 tablet (12.5 mg total) by mouth 3 (three) times daily as needed for dizziness or nausea.   15 tablet   0   . meloxicam (MOBIC) 7.5 MG tablet   Oral   Take 7.5 mg by mouth daily.         Marland Kitchen oxybutynin (DITROPAN-XL) 10 MG 24 hr tablet   Oral   Take 10 mg by mouth at bedtime.         . pravastatin (PRAVACHOL) 80 MG tablet   Oral   Take 80 mg by mouth daily.         . ciprofloxacin (CIPRO) 500 MG tablet   Oral   Take 500 mg by mouth 2 (two) times daily.         . DULoxetine (CYMBALTA) 60 MG capsule   Oral   Take 60 mg by mouth 2 (two) times daily.            BP 136/83  Pulse 74  Temp(Src) 98.2 F (36.8 C) (Oral)  Resp 14  SpO2 97%  Physical Exam Elderly thin black male in no acute distress HEENT: No pallor, muscular mucosa Chest: Bilateral equal air entry, fine crackles bilaterally CVS: Normal S1 and S2 Abdomen: Soft, nontender, nondistended, bowel sounds present Extremities: Warm, no edema CNS: AAO x3 cranial nerves 2-12 intact, normal extraocular movements, normal motor and sensory exam, normal gait ED Course  Procedures (including critical care time)    Assessment/plan Dizziness and vertigo Likely secondary to benign positional vertigo. Will Prescribe him an increased dose of meclizine 25 mg 3 times a day as needed.  COPD Not on any medications We'll prescribe albuterol inhaler and strongly counseled on smoking cessation  Remaining medical issues are stable.  Medications refilled  and followup along with future  Labs scheduled    MDM          Eddie North, MD 04/10/13 1925

## 2013-04-10 NOTE — ED Notes (Signed)
Patient states he is here for follow up. Patient complains of discomfort in ears bilaterally.

## 2013-05-04 NOTE — ED Notes (Signed)
Message on answering machine , pt requesting refill for pain medications. Patient of adult care clinic, will need to contact them for any medication management, spoke w patient, and advised same

## 2013-05-06 ENCOUNTER — Emergency Department (HOSPITAL_COMMUNITY): Payer: Medicare Other

## 2013-05-06 ENCOUNTER — Emergency Department (HOSPITAL_COMMUNITY)
Admission: EM | Admit: 2013-05-06 | Discharge: 2013-05-06 | Disposition: A | Discharge status: Home / Self Care | Payer: Medicare Other | Attending: Emergency Medicine | Admitting: Emergency Medicine

## 2013-05-06 ENCOUNTER — Encounter (HOSPITAL_COMMUNITY): Payer: Self-pay | Admitting: *Deleted

## 2013-05-06 DIAGNOSIS — I10 Essential (primary) hypertension: Secondary | ICD-10-CM | POA: Insufficient documentation

## 2013-05-06 DIAGNOSIS — J4489 Other specified chronic obstructive pulmonary disease: Secondary | ICD-10-CM | POA: Insufficient documentation

## 2013-05-06 DIAGNOSIS — E119 Type 2 diabetes mellitus without complications: Secondary | ICD-10-CM | POA: Insufficient documentation

## 2013-05-06 DIAGNOSIS — H409 Unspecified glaucoma: Secondary | ICD-10-CM | POA: Insufficient documentation

## 2013-05-06 DIAGNOSIS — F3289 Other specified depressive episodes: Secondary | ICD-10-CM | POA: Insufficient documentation

## 2013-05-06 DIAGNOSIS — D509 Iron deficiency anemia, unspecified: Secondary | ICD-10-CM | POA: Insufficient documentation

## 2013-05-06 DIAGNOSIS — Z79899 Other long term (current) drug therapy: Secondary | ICD-10-CM | POA: Insufficient documentation

## 2013-05-06 DIAGNOSIS — Y9289 Other specified places as the place of occurrence of the external cause: Secondary | ICD-10-CM | POA: Insufficient documentation

## 2013-05-06 DIAGNOSIS — Z8719 Personal history of other diseases of the digestive system: Secondary | ICD-10-CM | POA: Insufficient documentation

## 2013-05-06 DIAGNOSIS — K219 Gastro-esophageal reflux disease without esophagitis: Secondary | ICD-10-CM | POA: Insufficient documentation

## 2013-05-06 DIAGNOSIS — F329 Major depressive disorder, single episode, unspecified: Secondary | ICD-10-CM | POA: Insufficient documentation

## 2013-05-06 DIAGNOSIS — Z8739 Personal history of other diseases of the musculoskeletal system and connective tissue: Secondary | ICD-10-CM | POA: Insufficient documentation

## 2013-05-06 DIAGNOSIS — Z8619 Personal history of other infectious and parasitic diseases: Secondary | ICD-10-CM | POA: Insufficient documentation

## 2013-05-06 DIAGNOSIS — J449 Chronic obstructive pulmonary disease, unspecified: Secondary | ICD-10-CM | POA: Insufficient documentation

## 2013-05-06 DIAGNOSIS — E7800 Pure hypercholesterolemia, unspecified: Secondary | ICD-10-CM | POA: Insufficient documentation

## 2013-05-06 DIAGNOSIS — Z8669 Personal history of other diseases of the nervous system and sense organs: Secondary | ICD-10-CM | POA: Insufficient documentation

## 2013-05-06 DIAGNOSIS — F17200 Nicotine dependence, unspecified, uncomplicated: Secondary | ICD-10-CM | POA: Insufficient documentation

## 2013-05-06 DIAGNOSIS — W268XXA Contact with other sharp object(s), not elsewhere classified, initial encounter: Secondary | ICD-10-CM | POA: Insufficient documentation

## 2013-05-06 DIAGNOSIS — S61209A Unspecified open wound of unspecified finger without damage to nail, initial encounter: Secondary | ICD-10-CM | POA: Insufficient documentation

## 2013-05-06 DIAGNOSIS — Z87448 Personal history of other diseases of urinary system: Secondary | ICD-10-CM | POA: Insufficient documentation

## 2013-05-06 DIAGNOSIS — Z8679 Personal history of other diseases of the circulatory system: Secondary | ICD-10-CM | POA: Insufficient documentation

## 2013-05-06 DIAGNOSIS — Y9389 Activity, other specified: Secondary | ICD-10-CM | POA: Insufficient documentation

## 2013-05-06 DIAGNOSIS — S61220A Laceration with foreign body of right index finger without damage to nail, initial encounter: Secondary | ICD-10-CM

## 2013-05-06 NOTE — ED Provider Notes (Addendum)
Xray with evidence of possible 4 mm radiopaque foreign object. Wound irrigated with sterile saline and explored. No foreign body appreciated. Have discussed with patient the possibility of foreign body remaining in wound; patient verbalizes understanding. Digital block performed to the right index finger prior to suturing and skin closed with 6 simple interrupted sutures. Patient tolerated procedure well with no immediate complications.  LACERATION REPAIR Performed by: Antony Madura Authorized by: Antony Madura Consent: Verbal consent obtained. Risks and benefits: risks, benefits and alternatives were discussed Consent given by: patient Patient identity confirmed: provided demographic data Prepped and Draped in normal sterile fashion Wound explored  Laceration Location: dorsal surface of R index finger  Laceration Length: 4.5cm  No Foreign Bodies seen or palpated  Anesthesia: digital block   Local anesthetic: lidocaine 2% without epinephrine  Anesthetic total: 2.5 ml  Irrigation method: syringe Amount of cleaning: standard  Skin closure: 5-0 black monofilament  Number of sutures: 6  Technique: simple interrupted  Patient tolerance: Patient tolerated the procedure well with no immediate complications.   Antony Madura, PA-C 05/06/13 1646  Donnetta Hutching, MD 05/06/13 1714    Medical screening examination/treatment/procedure(s) were conducted as a shared visit with non-physician practitioner(s) and myself.  I personally evaluated the patient during the encounter.   Chief complaint laceration to right index finger.   History of present illness:  Accidental laceration to right index finger on a plate glass as prior to arrival. Pain is minimal. Patient has full range of motion of the digit. Palpation makes pain worse. Severity is moderate. No other injury.  Physical exam:  Constitutional alert and oriented x3. HEENT normal.  Neuro full range of motion of all extremities.  Psychiatric normal mentation. Musculoskeletal:  4.5 CM  laceration over the dorsal aspect of the right index finger over the PIP joint. No obvious tendon involvement.  Impression:  4.5 CM right index finger lac  Plan:  Laceration repair per physician's assistant  Donnetta Hutching, MD 05/06/13 1718  Donnetta Hutching, MD 05/17/13 [id number]

## 2013-05-06 NOTE — ED Notes (Signed)
Reports breaking a glass and has large laceration to right index finger.

## 2013-05-06 NOTE — ED Notes (Signed)
Pt returned from radiology.

## 2013-05-10 ENCOUNTER — Emergency Department (HOSPITAL_COMMUNITY)
Admission: EM | Admit: 2013-05-10 | Discharge: 2013-05-10 | Disposition: A | Discharge status: Home / Self Care | Payer: Medicare Other | Attending: Emergency Medicine | Admitting: Emergency Medicine

## 2013-05-10 ENCOUNTER — Encounter (HOSPITAL_COMMUNITY): Payer: Self-pay | Admitting: Adult Health

## 2013-05-10 DIAGNOSIS — D509 Iron deficiency anemia, unspecified: Secondary | ICD-10-CM | POA: Insufficient documentation

## 2013-05-10 DIAGNOSIS — J449 Chronic obstructive pulmonary disease, unspecified: Secondary | ICD-10-CM | POA: Insufficient documentation

## 2013-05-10 DIAGNOSIS — Z79899 Other long term (current) drug therapy: Secondary | ICD-10-CM | POA: Insufficient documentation

## 2013-05-10 DIAGNOSIS — Z8669 Personal history of other diseases of the nervous system and sense organs: Secondary | ICD-10-CM | POA: Insufficient documentation

## 2013-05-10 DIAGNOSIS — F17200 Nicotine dependence, unspecified, uncomplicated: Secondary | ICD-10-CM | POA: Insufficient documentation

## 2013-05-10 DIAGNOSIS — Y838 Other surgical procedures as the cause of abnormal reaction of the patient, or of later complication, without mention of misadventure at the time of the procedure: Secondary | ICD-10-CM | POA: Insufficient documentation

## 2013-05-10 DIAGNOSIS — T798XXA Other early complications of trauma, initial encounter: Secondary | ICD-10-CM

## 2013-05-10 DIAGNOSIS — Z8739 Personal history of other diseases of the musculoskeletal system and connective tissue: Secondary | ICD-10-CM | POA: Insufficient documentation

## 2013-05-10 DIAGNOSIS — Z8719 Personal history of other diseases of the digestive system: Secondary | ICD-10-CM | POA: Insufficient documentation

## 2013-05-10 DIAGNOSIS — I10 Essential (primary) hypertension: Secondary | ICD-10-CM | POA: Insufficient documentation

## 2013-05-10 DIAGNOSIS — J4489 Other specified chronic obstructive pulmonary disease: Secondary | ICD-10-CM | POA: Insufficient documentation

## 2013-05-10 DIAGNOSIS — E119 Type 2 diabetes mellitus without complications: Secondary | ICD-10-CM | POA: Insufficient documentation

## 2013-05-10 DIAGNOSIS — F3289 Other specified depressive episodes: Secondary | ICD-10-CM | POA: Insufficient documentation

## 2013-05-10 DIAGNOSIS — T8140XA Infection following a procedure, unspecified, initial encounter: Secondary | ICD-10-CM | POA: Insufficient documentation

## 2013-05-10 DIAGNOSIS — Z87448 Personal history of other diseases of urinary system: Secondary | ICD-10-CM | POA: Insufficient documentation

## 2013-05-10 DIAGNOSIS — K219 Gastro-esophageal reflux disease without esophagitis: Secondary | ICD-10-CM | POA: Insufficient documentation

## 2013-05-10 DIAGNOSIS — Z8619 Personal history of other infectious and parasitic diseases: Secondary | ICD-10-CM | POA: Insufficient documentation

## 2013-05-10 DIAGNOSIS — E7800 Pure hypercholesterolemia, unspecified: Secondary | ICD-10-CM | POA: Insufficient documentation

## 2013-05-10 DIAGNOSIS — Z8659 Personal history of other mental and behavioral disorders: Secondary | ICD-10-CM | POA: Insufficient documentation

## 2013-05-10 DIAGNOSIS — F329 Major depressive disorder, single episode, unspecified: Secondary | ICD-10-CM | POA: Insufficient documentation

## 2013-05-10 MED ORDER — CEPHALEXIN 250 MG PO CAPS
1000.0000 mg | ORAL_CAPSULE | Freq: Once | ORAL | Status: AC
  Administered 2013-05-10: 1000 mg via ORAL
  Filled 2013-05-10: qty 4

## 2013-05-10 MED ORDER — HYDROCODONE-ACETAMINOPHEN 5-325 MG PO TABS: 1.0000 | ORAL_TABLET | ORAL | Status: DC | PRN

## 2013-05-10 MED ORDER — CEPHALEXIN 500 MG PO CAPS: 1000.0000 mg | ORAL_CAPSULE | Freq: Two times a day (BID) | ORAL | Status: DC

## 2013-05-10 NOTE — ED Provider Notes (Signed)
History     CSN: [id number]  Arrival date & time 05/10/13  2034   First MD Initiated Contact with Patient 05/10/13 2119      Chief Complaint  Patient presents with  . Wound Dehiscence    (Consider location/radiation/quality/duration/timing/severity/associated sxs/prior treatment) HPI History provided by pt and prior chart.  Per prior chart, pt seen in ED on 5/17 for laceration to right index finger.  Xray showed possible 4mm foreign body vs. Degenerative changes of finger.  Wound sutured and pt d/c'd home.  Returns today because swelling and pain are mildly increased, sutures have busted and there is purulent drainage.  Denies fever.  Has not been taking anything for pain.  Past Medical History  Diagnosis Date  . Scabies   . DIABETES MELLITUS, TYPE II, CONTROLLED   . HYPERCHOLESTEROLEMIA, MIXED   . ANEMIA, IRON DEFICIENCY NOS   . ERECTILE DYSFUNCTION   . CATARACTS, BILATERAL   . HYPERTENSION   . COPD   . G E R D   . DIVERTICULOSIS, COLON   . MILD SPINAL STENOSIS, CERVICAL   . DYSPHAGIA UNSPECIFIED   . SEIZURE, GRAND MAL   . BENIGN PROSTATIC HYPERTROPHY, WITH URINARY OBSTRUCTION   . HTN (hypertension)   . Mitral regurgitation   . Glaucoma(365)   . Depression     Past Surgical History  Procedure Laterality Date  . Knee arthroscopy    . Eye surgery    . Intracervical discectomy with fusion  c4-5 and c5-6--dr. cram    . Capsulotomy Right 01/26/2013    Procedure: MINOR CAPSULOTOMY;  Surgeon: Vita Erm., MD;  Location: [facility] OR;  Service: Ophthalmology;  Laterality: Right;  . Yag laser application Right 01/26/2013    Procedure: YAG LASER APPLICATION;  Surgeon: Vita Erm., MD;  Location: [facility] OR;  Service: Ophthalmology;  Laterality: Right;    Family History  Problem Relation Age of Onset  . Diabetes    . Cancer      History  Substance Use Topics  . Smoking status: Current Every Day Smoker -- 1.00 packs/day  . Smokeless tobacco: Never Used  .  Alcohol Use: No      Review of Systems  All other systems reviewed and are negative.    Allergies  Review of patient's allergies indicates no known allergies.  Home Medications   Current Outpatient Rx  Name  Route  Sig  Dispense  Refill  . albuterol (PROVENTIL HFA;VENTOLIN HFA) 108 (90 BASE) MCG/ACT inhaler   Inhalation   Inhale 2 puffs into the lungs every 6 (six) hours as needed for wheezing or shortness of breath.         Marland Kitchen amitriptyline (ELAVIL) 10 MG tablet   Oral   Take 1 tablet (10 mg total) by mouth at bedtime as needed for sleep.   30 tablet   3   . bimatoprost (LUMIGAN) 0.01 % SOLN   Both Eyes   Place 1 drop into both eyes at bedtime.   30 mL   3   . brimonidine-timolol (COMBIGAN) 0.2-0.5 % ophthalmic solution   Both Eyes   Place 1 drop into both eyes every 12 (twelve) hours.         . DULoxetine (CYMBALTA) 60 MG capsule   Oral   Take 60 mg by mouth 2 (two) times daily.         Marland Kitchen esomeprazole (NEXIUM) 40 MG capsule   Oral   Take 1 capsule (40 mg total) by  mouth 2 (two) times daily.   30 capsule   3   . ferrous sulfate 325 (65 FE) MG tablet   Oral   Take 1 tablet (325 mg total) by mouth daily.   30 tablet   3   . gabapentin (NEURONTIN) 600 MG tablet   Oral   Take 1 tablet (600 mg total) by mouth 2 (two) times daily.   60 tablet   0   . HYDROcodone-acetaminophen (NORCO) 10-325 MG per tablet   Oral   Take 1 tablet by mouth every 8 (eight) hours as needed for pain.          Marland Kitchen losartan (COZAAR) 100 MG tablet   Oral   Take 1 tablet (100 mg total) by mouth daily.   30 tablet   3   . meclizine (ANTIVERT) 12.5 MG tablet   Oral   Take 2 tablets (25 mg total) by mouth 3 (three) times daily as needed for dizziness or nausea.   30 tablet   0   . oxybutynin (DITROPAN-XL) 10 MG 24 hr tablet   Oral   Take 1 tablet (10 mg total) by mouth at bedtime.   30 tablet   3   . pravastatin (PRAVACHOL) 80 MG tablet   Oral   Take 1 tablet (80  mg total) by mouth daily.   30 tablet   3   . cephALEXin (KEFLEX) 500 MG capsule   Oral   Take 2 capsules (1,000 mg total) by mouth 2 (two) times daily.   28 capsule   0   . HYDROcodone-acetaminophen (NORCO/VICODIN) 5-325 MG per tablet   Oral   Take 1 tablet by mouth every 4 (four) hours as needed for pain.   12 tablet   0     Dispense as written.     BP 149/91  Pulse 59  Temp(Src) 98.2 F (36.8 C) (Oral)  Resp 16  SpO2 97%  Physical Exam  Nursing note and vitals reviewed. Constitutional: He is oriented to person, place, and time. He appears well-developed and well-nourished. No distress.  HENT:  Head: Normocephalic and atraumatic.  Eyes:  Normal appearance  Neck: Normal range of motion.  Pulmonary/Chest: Effort normal.  Musculoskeletal: Normal range of motion.  Right index finger w/ 3cm lac to medial aspect of right index finger at PIP joint.  No surrounding erythema.  No drainage.  Mild edema of joint.  Tenderness at wound but not any other aspect of joint.  Active flexion of joint w/out pain.  Brisk cap refill and distal sensation intact.    Neurological: He is alert and oriented to person, place, and time.  Psychiatric: He has a normal mood and affect. His behavior is normal.    ED Course  Procedures (including critical care time)  Labs Reviewed - No data to display No results found.   1. Wound infection, initial encounter       MDM   Pt presents w/ possible wound infection of right index finger.  Reported increased pain/edema as well as purulent drainage.  No drainage currently nor cellulitis.  Low suspicion for joint infection; tenderness localized to wound and full ROM joint.  Pt received dose of keflex in ED and d/c'd home w/ same + 12 vicodin and referral to Hand.  Importance of f/u as well as return precautions discussed.  Did not re-suture the wound.  Nursing staff has applied a bulky dressing.         Otilio Miu,  PA-C 05/10/13  2301

## 2013-05-10 NOTE — ED Notes (Signed)
Right middle finger laceration, stitches placed Saturday, today the wound has dehiscenced. Middle finger with swelling, no drainage noted. Pt denies pain.

## 2013-05-10 NOTE — ED Notes (Signed)
Pt reports he cut himself with glass on Saturday and had sutures placed that day. Today he was picking up something when his sutures came out, no bleeding. Pt had bandage on 2nd digit of right hand. Pt in nad, skin warm and dry, resp e/u.

## 2013-05-11 NOTE — ED Provider Notes (Signed)
Medical screening examination/treatment/procedure(s) were performed by non-physician practitioner and as supervising physician I was immediately available for consultation/collaboration.   Dione Booze, MD 05/11/13 [phone]

## 2013-05-17 ENCOUNTER — Emergency Department (HOSPITAL_COMMUNITY)
Admission: EM | Admit: 2013-05-17 | Discharge: 2013-05-18 | Disposition: A | Discharge status: Home / Self Care | Payer: Medicare Other | Attending: Emergency Medicine | Admitting: Emergency Medicine

## 2013-05-17 ENCOUNTER — Encounter (HOSPITAL_COMMUNITY): Payer: Self-pay | Admitting: Emergency Medicine

## 2013-05-17 DIAGNOSIS — J4489 Other specified chronic obstructive pulmonary disease: Secondary | ICD-10-CM | POA: Insufficient documentation

## 2013-05-17 DIAGNOSIS — E119 Type 2 diabetes mellitus without complications: Secondary | ICD-10-CM | POA: Insufficient documentation

## 2013-05-17 DIAGNOSIS — K219 Gastro-esophageal reflux disease without esophagitis: Secondary | ICD-10-CM | POA: Insufficient documentation

## 2013-05-17 DIAGNOSIS — Y838 Other surgical procedures as the cause of abnormal reaction of the patient, or of later complication, without mention of misadventure at the time of the procedure: Secondary | ICD-10-CM | POA: Insufficient documentation

## 2013-05-17 DIAGNOSIS — Z87448 Personal history of other diseases of urinary system: Secondary | ICD-10-CM | POA: Insufficient documentation

## 2013-05-17 DIAGNOSIS — F329 Major depressive disorder, single episode, unspecified: Secondary | ICD-10-CM | POA: Insufficient documentation

## 2013-05-17 DIAGNOSIS — Z8739 Personal history of other diseases of the musculoskeletal system and connective tissue: Secondary | ICD-10-CM | POA: Insufficient documentation

## 2013-05-17 DIAGNOSIS — Z8619 Personal history of other infectious and parasitic diseases: Secondary | ICD-10-CM | POA: Insufficient documentation

## 2013-05-17 DIAGNOSIS — Z8719 Personal history of other diseases of the digestive system: Secondary | ICD-10-CM | POA: Insufficient documentation

## 2013-05-17 DIAGNOSIS — D649 Anemia, unspecified: Secondary | ICD-10-CM | POA: Insufficient documentation

## 2013-05-17 DIAGNOSIS — F3289 Other specified depressive episodes: Secondary | ICD-10-CM | POA: Insufficient documentation

## 2013-05-17 DIAGNOSIS — E7800 Pure hypercholesterolemia, unspecified: Secondary | ICD-10-CM | POA: Insufficient documentation

## 2013-05-17 DIAGNOSIS — J449 Chronic obstructive pulmonary disease, unspecified: Secondary | ICD-10-CM | POA: Insufficient documentation

## 2013-05-17 DIAGNOSIS — Z792 Long term (current) use of antibiotics: Secondary | ICD-10-CM | POA: Insufficient documentation

## 2013-05-17 DIAGNOSIS — T8130XA Disruption of wound, unspecified, initial encounter: Secondary | ICD-10-CM | POA: Insufficient documentation

## 2013-05-17 DIAGNOSIS — Z8669 Personal history of other diseases of the nervous system and sense organs: Secondary | ICD-10-CM | POA: Insufficient documentation

## 2013-05-17 DIAGNOSIS — I10 Essential (primary) hypertension: Secondary | ICD-10-CM | POA: Insufficient documentation

## 2013-05-17 DIAGNOSIS — Z8679 Personal history of other diseases of the circulatory system: Secondary | ICD-10-CM | POA: Insufficient documentation

## 2013-05-17 DIAGNOSIS — T8131XD Disruption of external operation (surgical) wound, not elsewhere classified, subsequent encounter: Secondary | ICD-10-CM

## 2013-05-17 DIAGNOSIS — F17200 Nicotine dependence, unspecified, uncomplicated: Secondary | ICD-10-CM | POA: Insufficient documentation

## 2013-05-17 NOTE — ED Notes (Signed)
PT. REPORTS SUTURES AT RIGHT INDEX FINGER CAME OUT YESTERDAY , PRESENTS WITH OPEN  LACERATION AT RIGHT INDEX FINGER WITH NO BLEEDING , PT. STATED FINGER SUTURED 2 WEEKS AGO.

## 2013-05-18 NOTE — ED Notes (Signed)
PT comfortable with d/c and f/u instructions. No prescriptions. 

## 2013-05-18 NOTE — ED Provider Notes (Signed)
History     CSN: [id number]  Arrival date & time 05/17/13  2110   None     Chief Complaint  Patient presents with  . Wound Dehiscence    (Consider location/radiation/quality/duration/timing/severity/associated sxs/prior treatment) HPI 72 year old male to emergency Department complaining of wound dehiscence.  Original sutures performed on 5/17.  Patient evaluated for wound dehiscence on 5/21.  At that time patient was instructed to continue using pressure dressings and to keep the area clean.  Patient denies any signs of infection including fevers, warmth, swelling, worsened range of motion or purulent drainage.  Patient states that he is here because he feels like is never going to heal this way because finger keeps busting open.  Past Medical History  Diagnosis Date  . Scabies   . DIABETES MELLITUS, TYPE II, CONTROLLED   . HYPERCHOLESTEROLEMIA, MIXED   . ANEMIA, IRON DEFICIENCY NOS   . ERECTILE DYSFUNCTION   . CATARACTS, BILATERAL   . HYPERTENSION   . COPD   . G E R D   . DIVERTICULOSIS, COLON   . MILD SPINAL STENOSIS, CERVICAL   . DYSPHAGIA UNSPECIFIED   . SEIZURE, GRAND MAL   . BENIGN PROSTATIC HYPERTROPHY, WITH URINARY OBSTRUCTION   . HTN (hypertension)   . Mitral regurgitation   . Glaucoma   . Depression     Past Surgical History  Procedure Laterality Date  . Knee arthroscopy    . Eye surgery    . Intracervical discectomy with fusion  c4-5 and c5-6--dr. cram    . Capsulotomy Right 01/26/2013    Procedure: MINOR CAPSULOTOMY;  Surgeon: Vita Erm., MD;  Location: [facility] OR;  Service: Ophthalmology;  Laterality: Right;  . Yag laser application Right 01/26/2013    Procedure: YAG LASER APPLICATION;  Surgeon: Vita Erm., MD;  Location: [facility] OR;  Service: Ophthalmology;  Laterality: Right;    Family History  Problem Relation Age of Onset  . Diabetes    . Cancer      History  Substance Use Topics  . Smoking status: Current Every Day Smoker --  1.00 packs/day  . Smokeless tobacco: Never Used  . Alcohol Use: No      Review of Systems Ten systems reviewed and are negative for acute change, except as noted in the HPI.    Allergies  Review of patient's allergies indicates no known allergies.  Home Medications   Current Outpatient Rx  Name  Route  Sig  Dispense  Refill  . albuterol (PROVENTIL HFA;VENTOLIN HFA) 108 (90 BASE) MCG/ACT inhaler   Inhalation   Inhale 2 puffs into the lungs every 6 (six) hours as needed for wheezing or shortness of breath.         Marland Kitchen amitriptyline (ELAVIL) 10 MG tablet   Oral   Take 1 tablet (10 mg total) by mouth at bedtime as needed for sleep.   30 tablet   3   . bimatoprost (LUMIGAN) 0.01 % SOLN   Both Eyes   Place 1 drop into both eyes at bedtime.   30 mL   3   . brimonidine-timolol (COMBIGAN) 0.2-0.5 % ophthalmic solution   Both Eyes   Place 1 drop into both eyes every 12 (twelve) hours.         . cephALEXin (KEFLEX) 500 MG capsule   Oral   Take 2 capsules (1,000 mg total) by mouth 2 (two) times daily.   28 capsule   0   . DULoxetine (CYMBALTA) 60  MG capsule   Oral   Take 60 mg by mouth 2 (two) times daily.         Marland Kitchen esomeprazole (NEXIUM) 40 MG capsule   Oral   Take 1 capsule (40 mg total) by mouth 2 (two) times daily.   30 capsule   3   . ferrous sulfate 325 (65 FE) MG tablet   Oral   Take 1 tablet (325 mg total) by mouth daily.   30 tablet   3   . gabapentin (NEURONTIN) 600 MG tablet   Oral   Take 1 tablet (600 mg total) by mouth 2 (two) times daily.   60 tablet   0   . HYDROcodone-acetaminophen (NORCO/VICODIN) 5-325 MG per tablet   Oral   Take 1 tablet by mouth every 4 (four) hours as needed for pain.   12 tablet   0     Dispense as written.   Marland Kitchen losartan (COZAAR) 100 MG tablet   Oral   Take 1 tablet (100 mg total) by mouth daily.   30 tablet   3   . meclizine (ANTIVERT) 12.5 MG tablet   Oral   Take 2 tablets (25 mg total) by mouth 3 (three)  times daily as needed for dizziness or nausea.   30 tablet   0   . oxybutynin (DITROPAN-XL) 10 MG 24 hr tablet   Oral   Take 1 tablet (10 mg total) by mouth at bedtime.   30 tablet   3   . pravastatin (PRAVACHOL) 80 MG tablet   Oral   Take 1 tablet (80 mg total) by mouth daily.   30 tablet   3     BP 123/72  Pulse 75  Temp(Src) 98 F (36.7 C) (Oral)  Resp 18  SpO2 99%  Physical Exam  Constitutional: He appears well-developed and well-nourished. No distress.  HENT:  Head: Normocephalic.  Eyes: Conjunctivae and EOM are normal. Pupils are equal, round, and reactive to light.  Neck: Normal range of motion. Neck supple.  Cardiovascular: Normal rate.   Intact distal pulses  Pulmonary/Chest: Effort normal.  Skin: Skin is dry. He is not diaphoretic.  Wound with poor approximation. No surrounding warmth or erythema. No purulent drainage. Mild ttp along wound.    ED Course  Procedures (including critical care time)  Labs Reviewed - No data to display No results found.   No diagnosis found.    MDM  Wound dehiscence  Patient ordered finger splint to prevent bending and it re\re opening of wound.  Advised cleaning twice a day and avoiding soaking in water.  Strict return precautions for signs of infection again discussed with patient.  Patient advised to continue taking full course of antibiotics as prescribed by previous provider.  Wound flushed with normal saline and dressing applied.         Jaci Carrel, [place] 05/18/13 [phone]

## 2013-05-20 NOTE — ED Provider Notes (Signed)
Medical screening examination/treatment/procedure(s) were performed by non-physician practitioner and as supervising physician I was immediately available for consultation/collaboration.   Brandt Loosen, MD 05/20/13 [phone]

## 2013-05-22 ENCOUNTER — Other Ambulatory Visit: Payer: Self-pay | Admitting: Orthopedic Surgery

## 2013-05-23 ENCOUNTER — Encounter (HOSPITAL_BASED_OUTPATIENT_CLINIC_OR_DEPARTMENT_OTHER): Payer: Self-pay | Admitting: *Deleted

## 2013-05-23 NOTE — Progress Notes (Signed)
Pt not good historian-he says he has no ride to come for labs today-will need istat and ekg-does not see cardiology in yrs, no seizures in yrs,has had several surgeries here-to bring all meds

## 2013-05-24 ENCOUNTER — Encounter (HOSPITAL_BASED_OUTPATIENT_CLINIC_OR_DEPARTMENT_OTHER): Payer: Self-pay | Admitting: *Deleted

## 2013-05-24 ENCOUNTER — Ambulatory Visit (HOSPITAL_BASED_OUTPATIENT_CLINIC_OR_DEPARTMENT_OTHER)
Admission: RE | Admit: 2013-05-24 | Discharge: 2013-05-24 | Disposition: A | Discharge status: Home / Self Care | Payer: Medicare Other | Source: Ambulatory Visit | Attending: Orthopedic Surgery | Admitting: Orthopedic Surgery

## 2013-05-24 ENCOUNTER — Ambulatory Visit (HOSPITAL_BASED_OUTPATIENT_CLINIC_OR_DEPARTMENT_OTHER): Payer: Medicare Other | Admitting: *Deleted

## 2013-05-24 ENCOUNTER — Encounter (HOSPITAL_BASED_OUTPATIENT_CLINIC_OR_DEPARTMENT_OTHER)
Admission: RE | Disposition: A | Discharge status: Home / Self Care | Payer: Self-pay | Source: Ambulatory Visit | Attending: Orthopedic Surgery

## 2013-05-24 DIAGNOSIS — N529 Male erectile dysfunction, unspecified: Secondary | ICD-10-CM | POA: Insufficient documentation

## 2013-05-24 DIAGNOSIS — H409 Unspecified glaucoma: Secondary | ICD-10-CM | POA: Insufficient documentation

## 2013-05-24 DIAGNOSIS — T8130XA Disruption of wound, unspecified, initial encounter: Secondary | ICD-10-CM | POA: Insufficient documentation

## 2013-05-24 DIAGNOSIS — I10 Essential (primary) hypertension: Secondary | ICD-10-CM | POA: Insufficient documentation

## 2013-05-24 DIAGNOSIS — F17200 Nicotine dependence, unspecified, uncomplicated: Secondary | ICD-10-CM | POA: Insufficient documentation

## 2013-05-24 DIAGNOSIS — X58XXXA Exposure to other specified factors, initial encounter: Secondary | ICD-10-CM | POA: Insufficient documentation

## 2013-05-24 DIAGNOSIS — J449 Chronic obstructive pulmonary disease, unspecified: Secondary | ICD-10-CM | POA: Insufficient documentation

## 2013-05-24 DIAGNOSIS — I059 Rheumatic mitral valve disease, unspecified: Secondary | ICD-10-CM | POA: Insufficient documentation

## 2013-05-24 DIAGNOSIS — Z981 Arthrodesis status: Secondary | ICD-10-CM | POA: Insufficient documentation

## 2013-05-24 DIAGNOSIS — K219 Gastro-esophageal reflux disease without esophagitis: Secondary | ICD-10-CM | POA: Insufficient documentation

## 2013-05-24 DIAGNOSIS — J4489 Other specified chronic obstructive pulmonary disease: Secondary | ICD-10-CM | POA: Insufficient documentation

## 2013-05-24 DIAGNOSIS — M4802 Spinal stenosis, cervical region: Secondary | ICD-10-CM | POA: Insufficient documentation

## 2013-05-24 DIAGNOSIS — S61209A Unspecified open wound of unspecified finger without damage to nail, initial encounter: Secondary | ICD-10-CM | POA: Insufficient documentation

## 2013-05-24 DIAGNOSIS — G40309 Generalized idiopathic epilepsy and epileptic syndromes, not intractable, without status epilepticus: Secondary | ICD-10-CM | POA: Insufficient documentation

## 2013-05-24 DIAGNOSIS — K5730 Diverticulosis of large intestine without perforation or abscess without bleeding: Secondary | ICD-10-CM | POA: Insufficient documentation

## 2013-05-24 DIAGNOSIS — D509 Iron deficiency anemia, unspecified: Secondary | ICD-10-CM | POA: Insufficient documentation

## 2013-05-24 DIAGNOSIS — Z79899 Other long term (current) drug therapy: Secondary | ICD-10-CM | POA: Insufficient documentation

## 2013-05-24 DIAGNOSIS — E782 Mixed hyperlipidemia: Secondary | ICD-10-CM | POA: Insufficient documentation

## 2013-05-24 DIAGNOSIS — E119 Type 2 diabetes mellitus without complications: Secondary | ICD-10-CM | POA: Insufficient documentation

## 2013-05-24 HISTORY — PX: I & D EXTREMITY: SHX5045

## 2013-05-24 HISTORY — DX: Unspecified convulsions: R56.9

## 2013-05-24 LAB — POCT I-STAT, CHEM 8
BUN: 17 mg/dL (ref 6–23)
Chloride: 106 mEq/L (ref 96–112)
Creatinine, Ser: 1.2 mg/dL (ref 0.50–1.35)
Potassium: 3.6 mEq/L (ref 3.5–5.1)
Sodium: 142 mEq/L (ref 135–145)

## 2013-05-24 SURGERY — IRRIGATION AND DEBRIDEMENT EXTREMITY
Anesthesia: Monitor Anesthesia Care | Site: Finger | Laterality: Right | Wound class: Clean

## 2013-05-24 MED ORDER — CEFAZOLIN SODIUM-DEXTROSE 2-3 GM-% IV SOLR: 2.0000 g | INTRAVENOUS | Status: DC

## 2013-05-24 MED ORDER — HYDROMORPHONE HCL PF 1 MG/ML IJ SOLN: 0.2500 mg | INTRAMUSCULAR | Status: DC | PRN

## 2013-05-24 MED ORDER — SULFAMETHOXAZOLE-TRIMETHOPRIM 800-160 MG PO TABS: 1.0000 | ORAL_TABLET | Freq: Two times a day (BID) | ORAL | Status: DC

## 2013-05-24 MED ORDER — OXYCODONE HCL 5 MG PO TABS: 5.0000 mg | ORAL_TABLET | Freq: Once | ORAL | Status: DC | PRN

## 2013-05-24 MED ORDER — OXYCODONE HCL 5 MG/5ML PO SOLN: 5.0000 mg | Freq: Once | ORAL | Status: DC | PRN

## 2013-05-24 MED ORDER — HYDROCODONE-ACETAMINOPHEN 5-325 MG PO TABS: 1.0000 | ORAL_TABLET | ORAL | Status: DC | PRN

## 2013-05-24 MED ORDER — MEPERIDINE HCL 25 MG/ML IJ SOLN: 6.2500 mg | INTRAMUSCULAR | Status: DC | PRN

## 2013-05-24 MED ORDER — ONDANSETRON HCL 4 MG/2ML IJ SOLN: 4.0000 mg | Freq: Once | INTRAMUSCULAR | Status: DC | PRN

## 2013-05-24 MED ORDER — LACTATED RINGERS IV SOLN
INTRAVENOUS | Status: DC
  Administered 2013-05-24: 07:00:00 via INTRAVENOUS

## 2013-05-24 MED ORDER — FENTANYL CITRATE 0.05 MG/ML IJ SOLN
INTRAMUSCULAR | Status: DC | PRN
  Administered 2013-05-24: 50 ug via INTRAVENOUS
  Administered 2013-05-24: 25 ug via INTRAVENOUS

## 2013-05-24 MED ORDER — LIDOCAINE HCL (PF) 1 % IJ SOLN: INTRAMUSCULAR | Status: DC | PRN

## 2013-05-24 MED ORDER — CEFAZOLIN SODIUM-DEXTROSE 2-3 GM-% IV SOLR
INTRAVENOUS | Status: DC | PRN
  Administered 2013-05-24: 2 g via INTRAVENOUS

## 2013-05-24 MED ORDER — LIDOCAINE HCL 1 % IJ SOLN
INTRAMUSCULAR | Status: DC | PRN
  Administered 2013-05-24: 09:00:00 via INTRAMUSCULAR

## 2013-05-24 MED ORDER — PROPOFOL INFUSION 10 MG/ML OPTIME
INTRAVENOUS | Status: DC | PRN
  Administered 2013-05-24: 15 mL via INTRAVENOUS

## 2013-05-24 MED ORDER — MIDAZOLAM HCL 5 MG/5ML IJ SOLN
INTRAMUSCULAR | Status: DC | PRN
  Administered 2013-05-24: 0.5 mg via INTRAVENOUS

## 2013-05-24 SURGICAL SUPPLY — 47 items
BANDAGE COBAN STERILE 2 (GAUZE/BANDAGES/DRESSINGS) IMPLANT
BANDAGE GAUZE ELAST BULKY 4 IN (GAUZE/BANDAGES/DRESSINGS) ×2 IMPLANT
BANDAGE GAUZE STRT 1 STR LF (GAUZE/BANDAGES/DRESSINGS) ×1 IMPLANT
BLADE MINI RND TIP GREEN BEAV (BLADE) IMPLANT
BLADE SURG 15 STRL LF DISP TIS (BLADE) ×1 IMPLANT
BLADE SURG 15 STRL SS (BLADE) ×2
BNDG COHESIVE 1X5 TAN STRL LF (GAUZE/BANDAGES/DRESSINGS) ×1 IMPLANT
BNDG COHESIVE 4X5 TAN STRL (GAUZE/BANDAGES/DRESSINGS) ×1 IMPLANT
BRUSH SCRUB EZ PLAIN DRY (MISCELLANEOUS) ×1 IMPLANT
CHLORAPREP W/TINT 26ML (MISCELLANEOUS) ×1 IMPLANT
CORDS BIPOLAR (ELECTRODE) ×1 IMPLANT
COVER MAYO STAND STRL (DRAPES) ×2 IMPLANT
COVER TABLE BACK 60X90 (DRAPES) ×2 IMPLANT
CUFF TOURNIQUET SINGLE 18IN (TOURNIQUET CUFF) ×1 IMPLANT
DEPRESSOR TONGUE BLADE STERILE (MISCELLANEOUS) ×1 IMPLANT
DRAPE EXTREMITY T 121X128X90 (DRAPE) ×2 IMPLANT
DRAPE SURG 17X23 STRL (DRAPES) ×2 IMPLANT
DRSG EMULSION OIL 3X3 NADH (GAUZE/BANDAGES/DRESSINGS) ×2 IMPLANT
GLOVE BIO SURGEON STRL SZ 6.5 (GLOVE) ×1 IMPLANT
GLOVE BIO SURGEON STRL SZ7.5 (GLOVE) ×2 IMPLANT
GLOVE BIOGEL PI IND STRL 7.0 (GLOVE) IMPLANT
GLOVE BIOGEL PI IND STRL 8 (GLOVE) ×1 IMPLANT
GLOVE BIOGEL PI INDICATOR 7.0 (GLOVE) ×1
GLOVE BIOGEL PI INDICATOR 8 (GLOVE) ×1
GLOVE EPREMIER NITRL EXT CFF L (GLOVE) IMPLANT
GLOVE EXAM NITRILE EXT CFF LRG (GLOVE) ×2 IMPLANT
GOWN PREVENTION PLUS XLARGE (GOWN DISPOSABLE) ×3 IMPLANT
NDL HYPO 25X1 1.5 SAFETY (NEEDLE) IMPLANT
NEEDLE HYPO 25X1 1.5 SAFETY (NEEDLE) ×2 IMPLANT
NS IRRIG 1000ML POUR BTL (IV SOLUTION) ×2 IMPLANT
PACK BASIN DAY SURGERY FS (CUSTOM PROCEDURE TRAY) ×2 IMPLANT
PAD CAST 4YDX4 CTTN HI CHSV (CAST SUPPLIES) ×1 IMPLANT
PADDING CAST ABS 4INX4YD NS (CAST SUPPLIES)
PADDING CAST ABS COTTON 4X4 ST (CAST SUPPLIES) IMPLANT
PADDING CAST COTTON 4X4 STRL (CAST SUPPLIES)
RUBBERBAND STERILE (MISCELLANEOUS) IMPLANT
SPONGE GAUZE 4X4 12PLY (GAUZE/BANDAGES/DRESSINGS) ×2 IMPLANT
STOCKINETTE 4X48 STRL (DRAPES) ×2 IMPLANT
SUT PDS AB 4-0 P3 18 (SUTURE) ×1 IMPLANT
SUT PROLENE 4 0 P 3 18 (SUTURE) ×1 IMPLANT
SUT VICRYL RAPIDE 4/0 PS 2 (SUTURE) IMPLANT
SYR BULB 3OZ (MISCELLANEOUS) ×2 IMPLANT
SYRINGE 10CC LL (SYRINGE) ×1 IMPLANT
TOWEL OR 17X24 6PK STRL BLUE (TOWEL DISPOSABLE) ×2 IMPLANT
TOWEL OR NON WOVEN STRL DISP B (DISPOSABLE) ×2 IMPLANT
TRAY DSU PREP LF (CUSTOM PROCEDURE TRAY) ×1 IMPLANT
UNDERPAD 30X30 INCONTINENT (UNDERPADS AND DIAPERS) ×2 IMPLANT

## 2013-05-24 NOTE — Anesthesia Preprocedure Evaluation (Signed)
Anesthesia Evaluation  Patient identified by MRN, date of birth, ID band Patient awake    Reviewed: Allergy & Precautions, H&P , NPO status , Patient's Chart, lab work & pertinent test results  Airway Mallampati: I TM Distance: >3 FB Neck ROM: Full    Dental   Pulmonary COPD         Cardiovascular hypertension, Pt. on medications     Neuro/Psych    GI/Hepatic GERD-  Medicated and Controlled,  Endo/Other    Renal/GU      Musculoskeletal   Abdominal   Peds  Hematology   Anesthesia Other Findings   Reproductive/Obstetrics                           Anesthesia Physical Anesthesia Plan  ASA: II  Anesthesia Plan: MAC   Post-op Pain Management:    Induction: Intravenous  Airway Management Planned: Natural Airway  Additional Equipment:   Intra-op Plan:   Post-operative Plan:   Informed Consent: I have reviewed the patients History and Physical, chart, labs and discussed the procedure including the risks, benefits and alternatives for the proposed anesthesia with the patient or authorized representative who has indicated his/her understanding and acceptance.     Plan Discussed with: CRNA and Surgeon  Anesthesia Plan Comments:         Anesthesia Quick Evaluation

## 2013-05-24 NOTE — Op Note (Signed)
05/24/2013  9:32 AM  PATIENT:  Russell [name]  72 y.o. male  PRE-OPERATIVE DIAGNOSIS:  Right index finger with dorsal wound  POST-OPERATIVE DIAGNOSIS:  Right index finger dorsal wound to include skin and tendon, with exposed joint  PROCEDURE:  Debridement right index finger wound to include skin, subcutaneous tissue, tendon, and bone                            Repair of right index finger extensor tendon in zone 3                            Simple closure right index finger wound, 2.5 cm  SURGEON: Cliffton Asters. Janee Morn, MD  PHYSICIAN ASSISTANT: None  ANESTHESIA:  local and MAC  SPECIMENS:  None  DRAINS:   None  PREOPERATIVE INDICATIONS:  [name] is a  72 y.o. male with a open right index finger wound now greater than 76 weeks old. Initially this was closed in the emergency department and then subsequently experienced dehiscence.  The risks benefits and alternatives were discussed with the patient preoperatively including but not limited to the risks of infection, bleeding, nerve injury, cardiopulmonary complications, the need for revision surgery, among others, and the patient verbalized understanding and consented to proceed.  OPERATIVE IMPLANTS: None  OPERATIVE FINDINGS: Laceration to tendon exposing PIP joint  OPERATIVE PROCEDURE:  After receiving prophylactic antibiotics, the patient was escorted to the operative theatre and placed in a supine position.  A surgical "time-out" was performed during which the planned procedure, proposed operative site, and the correct patient identity were compared to the operative consent and agreement confirmed by the circulating nurse according to current facility policy.  Following application of a tourniquet to the operative extremity, a digital block was performed with a mixture of lidocaine and Marcaine bearing epinephrine, and then the exposed skin was prepped with Betadine scrubbed/Betadine paint and draped in the usual sterile  fashion.  The limb was exsanguinated with an Esmarch bandage and the tourniquet inflated to approximately higher than systolic BP.  Initially it appeared as if the tendon was intact, however as a granulation tissue and early scar tissue was debrided with a curet and a rongeur, a laceration to the extensor tendon could be appreciated. The wound was aggressively and meticulously debrided, to include the skin edges themselves. The tendon was only scantly debrided. The cartilage on the joint surfaces were still intact. There was granulation even against the bone proximal to the joint dorsally and this was debrided with a rongeur and curette. Was then copiously irrigated. Satisfied with the preparation of the surfaces, the extensor tendon was repaired with 4-0 PDS suture. Tourniquet was released and additional hemostasis and necessary and the skin was closed with 4-0 Prolene interrupted sutures. The wound was dressed in the finger splinted with a volar tongue blade placing the PIP joint in as much extension as it could obtain, which was still in 10-15 shy of neutral. He was taken to the recovery room.  DISPOSITION: The patient be discharged home today with typical postop instructions returning in 7-10 days for reevaluation to include a hand therapy appointment to have a splint constructed and begin zone 3 extensor tendon repair protocol.

## 2013-05-24 NOTE — Transfer of Care (Signed)
Immediate Anesthesia Transfer of Care Note  Patient: Russell [name]  Procedure(s) Performed: Procedure(s): RIGHT INDEX WOUND DEBRIDEMENT AND CLOSURE (Right)  Patient Location: PACU  Anesthesia Type:MAC  Level of Consciousness: awake, alert  and oriented  Airway & Oxygen Therapy: Patient Spontanous Breathing and Patient connected to face mask oxygen  Post-op Assessment: Report given to PACU RN, Post -op Vital signs reviewed and stable and Patient moving all extremities  Post vital signs: Reviewed and stable  Complications: No apparent anesthesia complications

## 2013-05-24 NOTE — H&P (Signed)
Russell [name] is an 72 y.o. male.   Chief Complaint: Right index finger wound HPI: Right index finger wound closed in the ER, now has dehisced revealing exposed extensor tendon with tenosynovium.  Past Medical History  Diagnosis Date  . Scabies   . DIABETES MELLITUS, TYPE II, CONTROLLED   . HYPERCHOLESTEROLEMIA, MIXED   . ANEMIA, IRON DEFICIENCY NOS   . ERECTILE DYSFUNCTION   . CATARACTS, BILATERAL   . HYPERTENSION   . COPD   . G E R D   . DIVERTICULOSIS, COLON   . MILD SPINAL STENOSIS, CERVICAL   . DYSPHAGIA UNSPECIFIED   . SEIZURE, GRAND MAL   . BENIGN PROSTATIC HYPERTROPHY, WITH URINARY OBSTRUCTION   . HTN (hypertension)   . Mitral regurgitation   . Glaucoma   . Depression   . Seizures     cannot remember how long ago-several years    Past Surgical History  Procedure Laterality Date  . Knee arthroscopy  2008    rt  . Intracervical discectomy with fusion  c4-5 and c5-6--dr. cram    . Capsulotomy Right 01/26/2013    Procedure: MINOR CAPSULOTOMY;  Surgeon: Vita Erm., MD;  Location: [facility] OR;  Service: Ophthalmology;  Laterality: Right;  . Yag laser application Right 01/26/2013    Procedure: YAG LASER APPLICATION;  Surgeon: Vita Erm., MD;  Location: [facility] OR;  Service: Ophthalmology;  Laterality: Right;  . Eye surgery      cataracts  . Shoulder arthroscopy  2012    left-bicept tendon repair-[facility]  . Laryngoscopy with dilatation      2011-[facility]    Family History  Problem Relation Age of Onset  . Diabetes    . Cancer     Social History:  reports that he has been smoking.  He has never used smokeless tobacco. He reports that he does not drink alcohol or use illicit drugs.  Allergies: No Known Allergies  Medications Prior to Admission  Medication Sig Dispense Refill  . albuterol (PROVENTIL HFA;VENTOLIN HFA) 108 (90 BASE) MCG/ACT inhaler Inhale 2 puffs into the lungs every 6 (six) hours as needed for wheezing or shortness of breath.      Marland Kitchen  amitriptyline (ELAVIL) 10 MG tablet Take 1 tablet (10 mg total) by mouth at bedtime as needed for sleep.  30 tablet  3  . bimatoprost (LUMIGAN) 0.01 % SOLN Place 1 drop into both eyes at bedtime.  30 mL  3  . brimonidine-timolol (COMBIGAN) 0.2-0.5 % ophthalmic solution Place 1 drop into both eyes every 12 (twelve) hours.      . cephALEXin (KEFLEX) 500 MG capsule Take 2 capsules (1,000 mg total) by mouth 2 (two) times daily.  28 capsule  0  . DULoxetine (CYMBALTA) 60 MG capsule Take 60 mg by mouth 2 (two) times daily.      Marland Kitchen esomeprazole (NEXIUM) 40 MG capsule Take 1 capsule (40 mg total) by mouth 2 (two) times daily.  30 capsule  3  . ferrous sulfate 325 (65 FE) MG tablet Take 1 tablet (325 mg total) by mouth daily.  30 tablet  3  . meclizine (ANTIVERT) 12.5 MG tablet Take 2 tablets (25 mg total) by mouth 3 (three) times daily as needed for dizziness or nausea.  30 tablet  0  . oxybutynin (DITROPAN-XL) 10 MG 24 hr tablet Take 1 tablet (10 mg total) by mouth at bedtime.  30 tablet  3  . pravastatin (PRAVACHOL) 80 MG tablet Take 1  tablet (80 mg total) by mouth daily.  30 tablet  3  . gabapentin (NEURONTIN) 600 MG tablet Take 1 tablet (600 mg total) by mouth 2 (two) times daily.  60 tablet  0  . HYDROcodone-acetaminophen (NORCO/VICODIN) 5-325 MG per tablet Take 1 tablet by mouth every 4 (four) hours as needed for pain.  12 tablet  0  . losartan (COZAAR) 100 MG tablet Take 1 tablet (100 mg total) by mouth daily.  30 tablet  3    Results for orders placed during the hospital encounter of 05/24/13 (from the past 48 hour(s))  POCT I-STAT, CHEM 8     Status: None   Collection Time    05/24/13  7:05 AM      Result Value Range   Sodium 142  135 - 145 mEq/L   Potassium 3.6  3.5 - 5.1 mEq/L   Chloride 106  96 - 112 mEq/L   BUN 17  6 - 23 mg/dL   Creatinine, Ser 1.61  0.50 - 1.35 mg/dL   Glucose, Bld 86  70 - 99 mg/dL   Calcium, Ion 0.96  0.45 - 1.30 mmol/L   TCO2 28  0 - 100 mmol/L   Hemoglobin 14.3   13.0 - 17.0 g/dL   HCT 40.9  81.1 - 91.4 %   No results found.  Review of Systems  All other systems reviewed and are negative.    Blood pressure 152/89, pulse 55, temperature 97.8 F (36.6 C), temperature source Oral, resp. rate 20, height 6\' 2"  (1.88 m), weight 75.479 kg (166 lb 6.4 oz), SpO2 99.00%. Physical Exam  Constitutional: He is oriented to person, place, and time. He appears well-developed and well-nourished.  HENT:  Head: Normocephalic and atraumatic.  Eyes: EOM are normal.  Neck: Normal range of motion.  Cardiovascular: Intact distal pulses.   Respiratory: Effort normal.  Musculoskeletal: Normal range of motion.  Neurological: He is alert and oriented to person, place, and time.  Skin: Skin is warm and dry.   right index finger with 2 half centimeter J-shaped wound over the dorsum of the PIP joint. No exposed joint, but tendon is exposed and the flap was retracted several millimeters. Serous drainage from the wound.  Assessment/Plan To the operating room for wound exploration, possible removal of foreign body deep in the wound, debridement and closure. Goals, risks, options reviewed and consent obtained.  Marquel Spoto A. 05/24/2013, 8:35 AM

## 2013-05-24 NOTE — Anesthesia Postprocedure Evaluation (Signed)
Anesthesia Post Note  Patient: Russell [name]  Procedure(s) Performed: Procedure(s) (LRB): RIGHT INDEX WOUND DEBRIDEMENT AND CLOSURE (Right)  Anesthesia type: general  Patient location: PACU  Post pain: Pain level controlled  Post assessment: Patient's Cardiovascular Status Stable  Last Vitals:  Filed Vitals:   05/24/13 1023  BP: 170/75  Pulse: 92  Temp: 36.6 C  Resp: 18    Post vital signs: Reviewed and stable  Level of consciousness: sedated  Complications: No apparent anesthesia complications

## 2013-05-24 NOTE — Anesthesia Procedure Notes (Signed)
Procedure Name: MAC Date/Time: 05/24/2013 8:42 AM Performed by: Meyer Russel Pre-anesthesia Checklist: Patient identified, Emergency Drugs available, Suction available and Patient being monitored Patient Re-evaluated:Patient Re-evaluated prior to inductionOxygen Delivery Method: Simple face mask Preoxygenation: Pre-oxygenation with 100% oxygen Intubation Type: IV induction Ventilation: Mask ventilation without difficulty Dental Injury: Teeth and Oropharynx as per pre-operative assessment

## 2013-05-25 ENCOUNTER — Encounter (HOSPITAL_BASED_OUTPATIENT_CLINIC_OR_DEPARTMENT_OTHER): Payer: Self-pay | Admitting: Orthopedic Surgery

## 2013-06-22 ENCOUNTER — Other Ambulatory Visit: Payer: Self-pay | Admitting: Geriatric Medicine

## 2013-06-30 ENCOUNTER — Telehealth: Payer: Self-pay | Admitting: Neurology

## 2013-07-03 NOTE — Telephone Encounter (Signed)
I see no order for this pt about elbow.   I called the company below, did not LM.   I called pt and LMVM for him on cell #. Re: ebow brace??

## 2013-08-17 ENCOUNTER — Ambulatory Visit (HOSPITAL_COMMUNITY)
Admission: RE | Admit: 2013-08-17 | Discharge: 2013-08-17 | Disposition: A | Discharge status: Home / Self Care | Payer: Medicare Other | Source: Ambulatory Visit | Attending: Internal Medicine | Admitting: Internal Medicine

## 2013-08-17 ENCOUNTER — Other Ambulatory Visit (HOSPITAL_COMMUNITY): Payer: Self-pay | Admitting: Internal Medicine

## 2013-08-17 DIAGNOSIS — I771 Stricture of artery: Secondary | ICD-10-CM | POA: Insufficient documentation

## 2013-08-17 DIAGNOSIS — J4489 Other specified chronic obstructive pulmonary disease: Secondary | ICD-10-CM | POA: Insufficient documentation

## 2013-08-17 DIAGNOSIS — R0989 Other specified symptoms and signs involving the circulatory and respiratory systems: Secondary | ICD-10-CM

## 2013-08-17 DIAGNOSIS — J449 Chronic obstructive pulmonary disease, unspecified: Secondary | ICD-10-CM | POA: Insufficient documentation

## 2013-08-22 DIAGNOSIS — M2550 Pain in unspecified joint: Secondary | ICD-10-CM | POA: Insufficient documentation

## 2013-08-22 DIAGNOSIS — E782 Mixed hyperlipidemia: Secondary | ICD-10-CM | POA: Insufficient documentation

## 2013-09-14 ENCOUNTER — Encounter: Payer: Self-pay | Admitting: *Deleted

## 2013-09-14 DIAGNOSIS — G5762 Lesion of plantar nerve, left lower limb: Secondary | ICD-10-CM

## 2013-09-14 DIAGNOSIS — G5760 Lesion of plantar nerve, unspecified lower limb: Secondary | ICD-10-CM | POA: Insufficient documentation

## 2013-09-17 ENCOUNTER — Other Ambulatory Visit: Payer: Self-pay | Admitting: Internal Medicine

## 2013-09-21 ENCOUNTER — Other Ambulatory Visit: Payer: Self-pay | Admitting: Podiatry

## 2013-09-21 MED ORDER — PROMETHAZINE HCL 25 MG PO TABS: 25.0000 mg | ORAL_TABLET | Freq: Four times a day (QID) | ORAL | Status: DC | PRN

## 2013-09-21 MED ORDER — CLINDAMYCIN HCL 150 MG PO CAPS: 150.0000 mg | ORAL_CAPSULE | Freq: Three times a day (TID) | ORAL | Status: DC

## 2013-09-21 MED ORDER — OXYCODONE-ACETAMINOPHEN 10-650 MG PO TABS: 1.0000 | ORAL_TABLET | Freq: Four times a day (QID) | ORAL | Status: DC | PRN

## 2013-09-22 ENCOUNTER — Encounter: Payer: Self-pay | Admitting: Podiatry

## 2013-09-22 ENCOUNTER — Telehealth: Payer: Self-pay | Admitting: *Deleted

## 2013-09-22 DIAGNOSIS — Z472 Encounter for removal of internal fixation device: Secondary | ICD-10-CM

## 2013-09-22 DIAGNOSIS — G5760 Lesion of plantar nerve, unspecified lower limb: Secondary | ICD-10-CM

## 2013-09-22 MED ORDER — OXYCODONE-ACETAMINOPHEN 10-325 MG PO TABS: 1.0000 | ORAL_TABLET | Freq: Four times a day (QID) | ORAL | Status: DC | PRN

## 2013-09-22 NOTE — Telephone Encounter (Signed)
[facility] states percocet 10/650mg  no longer in production, can the prescription be changed.  Dr Irving Shows ordered Percocet 10/325mg  same sig and quanitiy. Will print out new rx and left detailed message for pt to pick-up script on pt's mobile phone.

## 2013-09-25 ENCOUNTER — Telehealth: Payer: Self-pay | Admitting: *Deleted

## 2013-09-25 NOTE — Telephone Encounter (Signed)
Pt requested pain medicine which was prescripted after 09/22/13 surgery.  I called pt asked if he picked up his rx on Friday, pt states no thought it would be called in.  I informed pt his rx was here to be picked up could not be called in.

## 2013-09-28 ENCOUNTER — Ambulatory Visit (INDEPENDENT_AMBULATORY_CARE_PROVIDER_SITE_OTHER): Payer: Medicare Other | Admitting: Podiatry

## 2013-09-28 ENCOUNTER — Ambulatory Visit: Payer: Self-pay

## 2013-09-28 ENCOUNTER — Encounter: Payer: Self-pay | Admitting: Podiatry

## 2013-09-28 ENCOUNTER — Ambulatory Visit: Payer: Medicare Other

## 2013-09-28 ENCOUNTER — Ambulatory Visit (INDEPENDENT_AMBULATORY_CARE_PROVIDER_SITE_OTHER): Payer: Medicare Other

## 2013-09-28 VITALS — BP 157/94 | HR 56 | Temp 96.7°F | Resp 16 | Ht 74.0 in | Wt 165.0 lb

## 2013-09-28 DIAGNOSIS — Z9889 Other specified postprocedural states: Secondary | ICD-10-CM

## 2013-09-28 DIAGNOSIS — G5760 Lesion of plantar nerve, unspecified lower limb: Secondary | ICD-10-CM

## 2013-09-28 DIAGNOSIS — G5782 Other specified mononeuropathies of left lower limb: Secondary | ICD-10-CM

## 2013-09-28 DIAGNOSIS — G588 Other specified mononeuropathies: Secondary | ICD-10-CM | POA: Insufficient documentation

## 2013-09-28 DIAGNOSIS — G5762 Lesion of plantar nerve, left lower limb: Secondary | ICD-10-CM

## 2013-09-28 NOTE — Progress Notes (Signed)
Subjective:     Patient ID: Russell [name], male   DOB: 1941-09-13, 72 y.o.   MRN: [id number]  HPI He presents today for his first postop visit, status post removal screws 23 and 4 of his left foot with a neurectomy third interdigital space of his left foot.  Review of Systems Noted in chart.    Objective:   Physical Exam Left foot demonstrates a dry sterile dressing intact. Once removed demonstrates sutures are intact to the distal aspect of the toes. And to the dorsum of the foot overlying the third interspace left. There is no erythema edema cellulitis drainage or odor. Radiographic evaluation demonstrates IPJ arthrodesis toes 23 and 4 of the left foot.    Assessment:     Postop visit #1. Well healing surgical foot left.    Plan:     Redressed today with a plaster compressive dressing followup with him in one week for suture removal. Encouraged him to continue use of the Darco shoe. The dressing dry and clean.

## 2013-09-28 NOTE — Patient Instructions (Signed)
Keep dressing dry and clean.  Keep foot elevated and shoe on as much as possible.

## 2013-10-05 ENCOUNTER — Encounter: Payer: Self-pay | Admitting: Podiatry

## 2013-10-05 ENCOUNTER — Ambulatory Visit (INDEPENDENT_AMBULATORY_CARE_PROVIDER_SITE_OTHER): Payer: Medicare Other | Admitting: Podiatry

## 2013-10-05 VITALS — BP 161/91 | HR 59 | Resp 16 | Ht 74.0 in | Wt 165.0 lb

## 2013-10-05 DIAGNOSIS — D3610 Benign neoplasm of peripheral nerves and autonomic nervous system, unspecified: Secondary | ICD-10-CM

## 2013-10-05 DIAGNOSIS — D219 Benign neoplasm of connective and other soft tissue, unspecified: Secondary | ICD-10-CM

## 2013-10-05 MED ORDER — OXYCODONE-ACETAMINOPHEN 10-325 MG PO TABS: 1.0000 | ORAL_TABLET | ORAL | Status: DC | PRN

## 2013-10-05 NOTE — Progress Notes (Signed)
Russell [name] presents today 2 weeks status post screw removal fourth toe left foot as well as a neurectomy to the third interdigital space left. He states that the toe still painful.  Objective: Vital signs are stable he is alert and oriented x3. Pulses are palpable left lower extremity. Sutures are intact and margins are well coapted. Sutures were removed today.  Assessment: Well-healing surgical foot with persistent postop pain left.  Plan: I will allow him to start getting this foot wet and get back into her regular shoe. I will followup with him in 2-4 weeks. I did rewrite his prescription for Percocet. This should be his final prescription.

## 2013-10-12 ENCOUNTER — Other Ambulatory Visit: Payer: Self-pay | Admitting: Internal Medicine

## 2013-10-17 ENCOUNTER — Telehealth: Payer: Self-pay | Admitting: *Deleted

## 2013-10-17 ENCOUNTER — Ambulatory Visit (INDEPENDENT_AMBULATORY_CARE_PROVIDER_SITE_OTHER): Payer: Medicare Other | Admitting: Podiatry

## 2013-10-17 ENCOUNTER — Encounter: Payer: Self-pay | Admitting: Podiatry

## 2013-10-17 VITALS — BP 159/85 | HR 70 | Resp 16

## 2013-10-17 DIAGNOSIS — T814XXD Infection following a procedure, subsequent encounter: Secondary | ICD-10-CM

## 2013-10-17 DIAGNOSIS — Z5189 Encounter for other specified aftercare: Secondary | ICD-10-CM

## 2013-10-17 MED ORDER — OXYCODONE-ACETAMINOPHEN 10-325 MG PO TABS: 1.0000 | ORAL_TABLET | ORAL | Status: DC | PRN

## 2013-10-17 NOTE — Telephone Encounter (Signed)
Pt states that his foot has opened up.  Referred to scheduler Shanda Bumps and was scheduled for 10/17/2013.

## 2013-10-17 NOTE — Progress Notes (Signed)
Russell [name] presents today states that I had a problem with my incision. As he relates to his neurectomy site third interdigital space left foot. States has been given me a hard time. He states that they're hasn't been some pus coming out of the incision site but seems to be doing better now.  Objective: Pulses are palpable left lower extremity. Incision site appears to be mildly red with a small area that appears to be opened distally. This is more than likely a suture that was retained.  Assessment: Pain in surgical foot secondary to retention of subcutaneous suture. Left foot.  Plan: I gave him some more pain medicine. He'll start soaking in Epsom salts warm water. I will followup with him his regular scheduled appointment.

## 2013-10-18 ENCOUNTER — Other Ambulatory Visit: Payer: Self-pay | Admitting: Internal Medicine

## 2013-11-01 ENCOUNTER — Telehealth: Payer: Self-pay | Admitting: *Deleted

## 2013-11-01 NOTE — Telephone Encounter (Addendum)
Pt request pain medication.  I will direct the message to Dr Al Corpus for orders.  Dr Al Corpus states received notification stating pt is receiving narcotics from multiple prescribers and locations, and he will not refill pain medication and will discuss with pt at the next appt.  Informed pt of the orders.

## 2013-11-02 NOTE — Telephone Encounter (Signed)
Russell [name] needs to wait until his next follow up.  He is also obtaining pain medication from other doctors.

## 2013-11-07 ENCOUNTER — Ambulatory Visit (INDEPENDENT_AMBULATORY_CARE_PROVIDER_SITE_OTHER): Payer: Medicare Other | Admitting: Podiatry

## 2013-11-07 VITALS — BP 146/92 | HR 70 | Resp 16 | Ht 74.0 in | Wt 168.0 lb

## 2013-11-07 DIAGNOSIS — Z9889 Other specified postprocedural states: Secondary | ICD-10-CM

## 2013-11-07 MED ORDER — OXYCODONE-ACETAMINOPHEN 10-325 MG PO TABS: 1.0000 | ORAL_TABLET | ORAL | Status: DC | PRN

## 2013-11-07 NOTE — Progress Notes (Signed)
Russell [name] presents today for followup neurectomy third interdigital space. His complaining of pain to the third digit in the third metatarsophalangeal joint. It seems that the pain to the fourth digit has resolved.  Objective: Vital signs are stable he is alert and oriented x3. Pulses are palpable left foot. Mild tenderness on palpation to the third interdigital space and to the third digit of the left foot.  Assessment: Pain in limb secondary to surgery. With neuralgias.  Plan: Dispensed Percocet #61 tablet every 6-8 hours. Followup with him in 6 weeks at which time we will consider sending him to pain clinic.

## 2013-12-19 ENCOUNTER — Ambulatory Visit (INDEPENDENT_AMBULATORY_CARE_PROVIDER_SITE_OTHER): Payer: Medicare Other | Admitting: Podiatry

## 2013-12-19 ENCOUNTER — Encounter: Payer: Self-pay | Admitting: Podiatry

## 2013-12-19 VITALS — BP 169/90 | HR 70 | Resp 16

## 2013-12-19 DIAGNOSIS — M7750 Other enthesopathy of unspecified foot: Secondary | ICD-10-CM

## 2013-12-19 MED ORDER — OXYCODONE-ACETAMINOPHEN 10-325 MG PO TABS: ORAL_TABLET | ORAL | Status: DC

## 2013-12-19 NOTE — Progress Notes (Signed)
Russell [name] presents today complaining of pain to the second third metatarsophalangeal joints of the left foot. He states that I ran out of pain pills 2 weeks ago and been hurting ever since.  Objective: Vital signs are stable he is alert and oriented x3. Pulses remain strongly his left foot. He has pain on end range of motion of the second and third metatarsophalangeal joints left foot.  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  Plan: Injected second and third metatarsophalangeal joints today with dexamethasone and local anesthetic after sterile Betadine skin prep. 2 mg was injected to each second metatarsophalangeal joint. I also wrote him another prescription for Percocet 60 number one to 2 every 6-8 hours when necessary pain. I will followup with him in 6-10 weeks

## 2014-01-01 NOTE — Progress Notes (Signed)
1) Removal fixation from 2nd, 3rd, and 4th toes left foot 2) Neurectomy 3rd interspace left foot

## 2014-01-04 ENCOUNTER — Other Ambulatory Visit: Payer: Self-pay | Admitting: Internal Medicine

## 2014-01-09 ENCOUNTER — Other Ambulatory Visit: Payer: Self-pay | Admitting: Internal Medicine

## 2014-01-16 ENCOUNTER — Telehealth: Payer: Self-pay | Admitting: *Deleted

## 2014-01-16 DIAGNOSIS — R52 Pain, unspecified: Secondary | ICD-10-CM

## 2014-01-16 MED ORDER — OXYCODONE-ACETAMINOPHEN 10-325 MG PO TABS: 1.0000 | ORAL_TABLET | Freq: Four times a day (QID) | ORAL | Status: DC | PRN

## 2014-01-16 NOTE — Telephone Encounter (Signed)
V.  You may give him another refill but we need to also inform him that we can no longer give him pain medication.  This must be provided to him by a pain management doctor.  If he does not have one then we need to set him up with Dr. Primus Bravo.  Thanks,

## 2014-01-16 NOTE — Telephone Encounter (Addendum)
Pt states he wants more pain medicine.  I informed pt, I would informed Dr Milinda Pointer to see if pt needed an appt prior to 02/2014, or if a new rx would be ordered.  Pt agreed.  I informed pt, Dr Milinda Pointer wrote for the last Percocet rx and referred him to [facility].  Pt states he can't go to [facility], can he go to his wife's Pain Management doctor, Dr Letha Cape.  I informed Dr Milinda Pointer and will wait for advice.   I referred to Dr Letha Cape with [facility] [phone].

## 2014-01-19 ENCOUNTER — Encounter: Payer: Self-pay | Admitting: Podiatry

## 2014-01-25 ENCOUNTER — Other Ambulatory Visit: Payer: Self-pay | Admitting: Internal Medicine

## 2014-02-08 ENCOUNTER — Encounter (HOSPITAL_COMMUNITY): Payer: Self-pay | Admitting: Emergency Medicine

## 2014-02-08 DIAGNOSIS — J4489 Other specified chronic obstructive pulmonary disease: Secondary | ICD-10-CM | POA: Insufficient documentation

## 2014-02-08 DIAGNOSIS — S4980XA Other specified injuries of shoulder and upper arm, unspecified arm, initial encounter: Secondary | ICD-10-CM | POA: Insufficient documentation

## 2014-02-08 DIAGNOSIS — J449 Chronic obstructive pulmonary disease, unspecified: Secondary | ICD-10-CM | POA: Insufficient documentation

## 2014-02-08 DIAGNOSIS — Y9289 Other specified places as the place of occurrence of the external cause: Secondary | ICD-10-CM | POA: Insufficient documentation

## 2014-02-08 DIAGNOSIS — W010XXA Fall on same level from slipping, tripping and stumbling without subsequent striking against object, initial encounter: Secondary | ICD-10-CM | POA: Insufficient documentation

## 2014-02-08 DIAGNOSIS — I10 Essential (primary) hypertension: Secondary | ICD-10-CM | POA: Insufficient documentation

## 2014-02-08 DIAGNOSIS — F17200 Nicotine dependence, unspecified, uncomplicated: Secondary | ICD-10-CM | POA: Insufficient documentation

## 2014-02-08 DIAGNOSIS — S46909A Unspecified injury of unspecified muscle, fascia and tendon at shoulder and upper arm level, unspecified arm, initial encounter: Secondary | ICD-10-CM | POA: Insufficient documentation

## 2014-02-08 DIAGNOSIS — Y9301 Activity, walking, marching and hiking: Secondary | ICD-10-CM | POA: Insufficient documentation

## 2014-02-08 DIAGNOSIS — S3981XA Other specified injuries of abdomen, initial encounter: Secondary | ICD-10-CM | POA: Insufficient documentation

## 2014-02-08 DIAGNOSIS — E119 Type 2 diabetes mellitus without complications: Secondary | ICD-10-CM | POA: Insufficient documentation

## 2014-02-08 NOTE — ED Notes (Signed)
Pt came to [facility] desk to ask about wait.  Pt made aware there was one person in front of him, and that there were multiple rooms opening in the near future.  Pt accepted this response and returned to the waiting room.

## 2014-02-08 NOTE — ED Notes (Signed)
Pt states that he was walking outside and fell on the ice.  Pt states since he has developed pain in his left arm from mid bicep down and when he attempts to lift anything he is having some abd pain.

## 2014-02-09 ENCOUNTER — Emergency Department (HOSPITAL_COMMUNITY)
Admission: EM | Admit: 2014-02-09 | Discharge: 2014-02-09 | Discharge status: Against Medical Advice | Payer: Medicare Other | Attending: Emergency Medicine | Admitting: Emergency Medicine

## 2014-02-09 NOTE — ED Notes (Signed)
Pt came to [facility] desk and stated that he could not wait any longer and that he was going home.  Attempted to convince pt to stay, but pt stated it did not matter how much longer he had to wait, that he would go home and just get in the bathtub.

## 2014-02-20 ENCOUNTER — Other Ambulatory Visit: Payer: Self-pay | Admitting: Orthopedic Surgery

## 2014-02-20 ENCOUNTER — Other Ambulatory Visit: Payer: Self-pay | Admitting: Internal Medicine

## 2014-02-27 ENCOUNTER — Ambulatory Visit (INDEPENDENT_AMBULATORY_CARE_PROVIDER_SITE_OTHER): Payer: Medicare Other | Admitting: Podiatry

## 2014-02-27 ENCOUNTER — Encounter: Payer: Self-pay | Admitting: Podiatry

## 2014-02-27 VITALS — BP 140/86 | HR 76 | Resp 12

## 2014-02-27 DIAGNOSIS — M779 Enthesopathy, unspecified: Principal | ICD-10-CM

## 2014-02-27 DIAGNOSIS — M7750 Other enthesopathy of unspecified foot: Secondary | ICD-10-CM

## 2014-02-27 DIAGNOSIS — M778 Other enthesopathies, not elsewhere classified: Secondary | ICD-10-CM

## 2014-02-27 MED ORDER — OXYCODONE-ACETAMINOPHEN 10-325 MG PO TABS: ORAL_TABLET | ORAL | Status: DC

## 2014-02-27 NOTE — Progress Notes (Signed)
He presents today complaining of pain to the fourth digit of the left foot. He states that I would like to have it amputated possible.  Objective: Vital signs are stable he is alert and oriented x3. Pulses are palpable. He has pain on palpation to either side of the fourth metatarsal of the left foot. End of the fourth toe.  Assessment: Capsulitis fourth MTPJ left.  Plan: Injected Kenalog and local anesthetic fourth metatarsophalangeal joint of the left foot wrote her prescription for Percocet 60 in number with no refills. All with him in 6 weeks

## 2014-03-08 ENCOUNTER — Encounter (HOSPITAL_BASED_OUTPATIENT_CLINIC_OR_DEPARTMENT_OTHER): Payer: Self-pay | Admitting: *Deleted

## 2014-03-08 NOTE — Progress Notes (Signed)
Pt was here 6/14-will come in for new ekg and bmet-still smokes and htn-no cardiac problems

## 2014-03-12 ENCOUNTER — Other Ambulatory Visit: Payer: Self-pay

## 2014-03-12 ENCOUNTER — Encounter (HOSPITAL_BASED_OUTPATIENT_CLINIC_OR_DEPARTMENT_OTHER)
Admission: RE | Admit: 2014-03-12 | Discharge: 2014-03-12 | Disposition: A | Discharge status: Home / Self Care | Payer: Medicare Other | Source: Ambulatory Visit | Attending: Orthopedic Surgery | Admitting: Orthopedic Surgery

## 2014-03-12 LAB — BASIC METABOLIC PANEL
BUN: 11 mg/dL (ref 6–23)
CALCIUM: 9.4 mg/dL (ref 8.4–10.5)
CO2: 25 mEq/L (ref 19–32)
CREATININE: 1.11 mg/dL (ref 0.50–1.35)
Chloride: 102 mEq/L (ref 96–112)
GFR calc Af Amer: 74 mL/min — ABNORMAL LOW (ref >90)
GFR, EST NON AFRICAN AMERICAN: 64 mL/min — AB (ref >90)
GLUCOSE: 82 mg/dL (ref 70–99)
Potassium: 4.1 mEq/L (ref 3.7–5.3)
Sodium: 140 mEq/L (ref 137–147)

## 2014-03-13 ENCOUNTER — Ambulatory Visit (HOSPITAL_BASED_OUTPATIENT_CLINIC_OR_DEPARTMENT_OTHER): Payer: Medicare Other | Admitting: Anesthesiology

## 2014-03-13 ENCOUNTER — Ambulatory Visit (HOSPITAL_BASED_OUTPATIENT_CLINIC_OR_DEPARTMENT_OTHER)
Admission: RE | Admit: 2014-03-13 | Discharge: 2014-03-13 | Disposition: A | Discharge status: Home / Self Care | Payer: Medicare Other | Source: Ambulatory Visit | Attending: Orthopedic Surgery | Admitting: Orthopedic Surgery

## 2014-03-13 ENCOUNTER — Encounter (HOSPITAL_BASED_OUTPATIENT_CLINIC_OR_DEPARTMENT_OTHER): Payer: Self-pay | Admitting: *Deleted

## 2014-03-13 ENCOUNTER — Encounter (HOSPITAL_BASED_OUTPATIENT_CLINIC_OR_DEPARTMENT_OTHER)
Admission: RE | Disposition: A | Discharge status: Home / Self Care | Payer: Self-pay | Source: Ambulatory Visit | Attending: Orthopedic Surgery

## 2014-03-13 ENCOUNTER — Encounter (HOSPITAL_BASED_OUTPATIENT_CLINIC_OR_DEPARTMENT_OTHER): Payer: Medicare Other | Admitting: Anesthesiology

## 2014-03-13 DIAGNOSIS — Z01812 Encounter for preprocedural laboratory examination: Secondary | ICD-10-CM | POA: Insufficient documentation

## 2014-03-13 DIAGNOSIS — H269 Unspecified cataract: Secondary | ICD-10-CM | POA: Insufficient documentation

## 2014-03-13 DIAGNOSIS — M4802 Spinal stenosis, cervical region: Secondary | ICD-10-CM | POA: Insufficient documentation

## 2014-03-13 DIAGNOSIS — F17200 Nicotine dependence, unspecified, uncomplicated: Secondary | ICD-10-CM | POA: Insufficient documentation

## 2014-03-13 DIAGNOSIS — N401 Enlarged prostate with lower urinary tract symptoms: Secondary | ICD-10-CM | POA: Insufficient documentation

## 2014-03-13 DIAGNOSIS — K5730 Diverticulosis of large intestine without perforation or abscess without bleeding: Secondary | ICD-10-CM | POA: Insufficient documentation

## 2014-03-13 DIAGNOSIS — F3289 Other specified depressive episodes: Secondary | ICD-10-CM | POA: Insufficient documentation

## 2014-03-13 DIAGNOSIS — E7800 Pure hypercholesterolemia, unspecified: Secondary | ICD-10-CM | POA: Insufficient documentation

## 2014-03-13 DIAGNOSIS — E119 Type 2 diabetes mellitus without complications: Secondary | ICD-10-CM | POA: Insufficient documentation

## 2014-03-13 DIAGNOSIS — F329 Major depressive disorder, single episode, unspecified: Secondary | ICD-10-CM | POA: Insufficient documentation

## 2014-03-13 DIAGNOSIS — J449 Chronic obstructive pulmonary disease, unspecified: Secondary | ICD-10-CM | POA: Insufficient documentation

## 2014-03-13 DIAGNOSIS — I059 Rheumatic mitral valve disease, unspecified: Secondary | ICD-10-CM | POA: Insufficient documentation

## 2014-03-13 DIAGNOSIS — R569 Unspecified convulsions: Secondary | ICD-10-CM | POA: Insufficient documentation

## 2014-03-13 DIAGNOSIS — Z01810 Encounter for preprocedural cardiovascular examination: Secondary | ICD-10-CM | POA: Insufficient documentation

## 2014-03-13 DIAGNOSIS — H409 Unspecified glaucoma: Secondary | ICD-10-CM | POA: Insufficient documentation

## 2014-03-13 DIAGNOSIS — D509 Iron deficiency anemia, unspecified: Secondary | ICD-10-CM | POA: Insufficient documentation

## 2014-03-13 DIAGNOSIS — M19039 Primary osteoarthritis, unspecified wrist: Secondary | ICD-10-CM | POA: Insufficient documentation

## 2014-03-13 DIAGNOSIS — J4489 Other specified chronic obstructive pulmonary disease: Secondary | ICD-10-CM | POA: Insufficient documentation

## 2014-03-13 DIAGNOSIS — N138 Other obstructive and reflux uropathy: Secondary | ICD-10-CM | POA: Insufficient documentation

## 2014-03-13 DIAGNOSIS — N139 Obstructive and reflux uropathy, unspecified: Secondary | ICD-10-CM | POA: Insufficient documentation

## 2014-03-13 DIAGNOSIS — K219 Gastro-esophageal reflux disease without esophagitis: Secondary | ICD-10-CM | POA: Insufficient documentation

## 2014-03-13 DIAGNOSIS — I10 Essential (primary) hypertension: Secondary | ICD-10-CM | POA: Insufficient documentation

## 2014-03-13 DIAGNOSIS — M19049 Primary osteoarthritis, unspecified hand: Secondary | ICD-10-CM | POA: Insufficient documentation

## 2014-03-13 DIAGNOSIS — N529 Male erectile dysfunction, unspecified: Secondary | ICD-10-CM | POA: Insufficient documentation

## 2014-03-13 HISTORY — PX: CARPECTOMY: SHX5004

## 2014-03-13 HISTORY — PX: CARPOMETACARPAL (CMC) FUSION OF THUMB: SHX6290

## 2014-03-13 LAB — POCT HEMOGLOBIN-HEMACUE: HEMOGLOBIN: 11.8 g/dL — AB (ref 13.0–17.0)

## 2014-03-13 SURGERY — CARPECTOMY
Anesthesia: Regional | Site: Hand | Laterality: Right

## 2014-03-13 MED ORDER — GLYCOPYRROLATE 0.2 MG/ML IJ SOLN
INTRAMUSCULAR | Status: DC | PRN
  Administered 2014-03-13 (×2): 0.2 mg via INTRAVENOUS

## 2014-03-13 MED ORDER — MIDAZOLAM HCL 5 MG/5ML IJ SOLN
INTRAMUSCULAR | Status: DC | PRN
  Administered 2014-03-13: 1 mg via INTRAVENOUS

## 2014-03-13 MED ORDER — ONDANSETRON HCL 4 MG/2ML IJ SOLN
INTRAMUSCULAR | Status: DC | PRN
  Administered 2014-03-13: 4 mg via INTRAVENOUS

## 2014-03-13 MED ORDER — LIDOCAINE HCL (CARDIAC) 20 MG/ML IV SOLN
INTRAVENOUS | Status: DC | PRN
  Administered 2014-03-13: 40 mg via INTRAVENOUS

## 2014-03-13 MED ORDER — OXYCODONE HCL 5 MG/5ML PO SOLN: 5.0000 mg | Freq: Once | ORAL | Status: DC | PRN

## 2014-03-13 MED ORDER — FENTANYL CITRATE 0.05 MG/ML IJ SOLN
INTRAMUSCULAR | Status: AC
  Filled 2014-03-13: qty 4

## 2014-03-13 MED ORDER — MIDAZOLAM HCL 2 MG/2ML IJ SOLN
INTRAMUSCULAR | Status: AC
  Filled 2014-03-13: qty 2

## 2014-03-13 MED ORDER — FENTANYL CITRATE 0.05 MG/ML IJ SOLN
INTRAMUSCULAR | Status: DC | PRN
Start: 2014-03-13 — End: 2014-03-13
  Administered 2014-03-13: 25 ug via INTRAVENOUS

## 2014-03-13 MED ORDER — CEFAZOLIN SODIUM-DEXTROSE 2-3 GM-% IV SOLR
INTRAVENOUS | Status: DC | PRN
  Administered 2014-03-13: 2 g via INTRAVENOUS

## 2014-03-13 MED ORDER — OXYCODONE HCL 5 MG PO TABS: 5.0000 mg | ORAL_TABLET | Freq: Once | ORAL | Status: DC | PRN

## 2014-03-13 MED ORDER — PHENYLEPHRINE HCL 10 MG/ML IJ SOLN
INTRAMUSCULAR | Status: DC | PRN
  Administered 2014-03-13 (×3): 100 ug via INTRAVENOUS

## 2014-03-13 MED ORDER — MIDAZOLAM HCL 2 MG/2ML IJ SOLN
INTRAMUSCULAR | Status: AC
Start: 2014-03-13 — End: 2014-03-13
  Filled 2014-03-13: qty 2

## 2014-03-13 MED ORDER — BUPIVACAINE-EPINEPHRINE PF 0.5-1:200000 % IJ SOLN
INTRAMUSCULAR | Status: DC | PRN
  Administered 2014-03-13: 30 mL via PERINEURAL

## 2014-03-13 MED ORDER — CHLORHEXIDINE GLUCONATE 4 % EX LIQD: 60.0000 mL | Freq: Once | CUTANEOUS | Status: DC

## 2014-03-13 MED ORDER — FENTANYL CITRATE 0.05 MG/ML IJ SOLN
INTRAMUSCULAR | Status: AC
  Filled 2014-03-13: qty 2

## 2014-03-13 MED ORDER — BUPIVACAINE HCL (PF) 0.5 % IJ SOLN
INTRAMUSCULAR | Status: DC | PRN
  Administered 2014-03-13: 8 mL

## 2014-03-13 MED ORDER — HYDROMORPHONE HCL PF 1 MG/ML IJ SOLN
0.2500 mg | INTRAMUSCULAR | Status: DC | PRN
Start: 2014-03-13 — End: 2014-03-13

## 2014-03-13 MED ORDER — FENTANYL CITRATE 0.05 MG/ML IJ SOLN
50.0000 ug | INTRAMUSCULAR | Status: DC | PRN
  Administered 2014-03-13: 100 ug via INTRAVENOUS

## 2014-03-13 MED ORDER — CEFAZOLIN SODIUM-DEXTROSE 2-3 GM-% IV SOLR
INTRAVENOUS | Status: AC
  Filled 2014-03-13: qty 50

## 2014-03-13 MED ORDER — LACTATED RINGERS IV SOLN
INTRAVENOUS | Status: DC
  Administered 2014-03-13 (×2): via INTRAVENOUS

## 2014-03-13 MED ORDER — PROPOFOL 10 MG/ML IV BOLUS
INTRAVENOUS | Status: DC | PRN
  Administered 2014-03-13: 150 mg via INTRAVENOUS

## 2014-03-13 MED ORDER — EPHEDRINE SULFATE 50 MG/ML IJ SOLN
INTRAMUSCULAR | Status: DC | PRN
  Administered 2014-03-13: 10 mg via INTRAVENOUS

## 2014-03-13 MED ORDER — OXYCODONE-ACETAMINOPHEN 5-325 MG PO TABS: ORAL_TABLET | ORAL | Status: DC

## 2014-03-13 MED ORDER — CEFAZOLIN SODIUM-DEXTROSE 2-3 GM-% IV SOLR: 2.0000 g | INTRAVENOUS | Status: DC

## 2014-03-13 MED ORDER — MIDAZOLAM HCL 2 MG/2ML IJ SOLN
1.0000 mg | INTRAMUSCULAR | Status: DC | PRN
  Administered 2014-03-13: 2 mg via INTRAVENOUS

## 2014-03-13 SURGICAL SUPPLY — 104 items
BANDAGE ELASTIC 3 VELCRO ST LF (GAUZE/BANDAGES/DRESSINGS) IMPLANT
BANDAGE ELASTIC 4 VELCRO ST LF (GAUZE/BANDAGES/DRESSINGS) ×3 IMPLANT
BIT DRILL 1.1 (BIT) ×2
BIT DRILL 1.1MM (BIT) ×1
BIT DRILL 60X20X1.1XQC TMX (BIT) IMPLANT
BIT DRL 60X20X1.1XQC TMX (BIT) ×1
BLADE ARTHRO LOK 4 BEAVER (BLADE) ×2 IMPLANT
BLADE ARTHRO LOK 4MM BEAVER (BLADE) ×1
BLADE EAR TYMPAN 2.5 60D BEAV (BLADE) IMPLANT
BLADE MINI RND TIP GREEN BEAV (BLADE) ×3 IMPLANT
BLADE SURG 15 STRL LF DISP TIS (BLADE) ×2 IMPLANT
BLADE SURG 15 STRL SS (BLADE) ×6
BNDG CMPR 9X4 STRL LF SNTH (GAUZE/BANDAGES/DRESSINGS) ×1
BNDG CMPR MD 5X2 ELC HKLP STRL (GAUZE/BANDAGES/DRESSINGS)
BNDG ELASTIC 2 VLCR STRL LF (GAUZE/BANDAGES/DRESSINGS) IMPLANT
BNDG ESMARK 4X9 LF (GAUZE/BANDAGES/DRESSINGS) ×3 IMPLANT
BNDG GAUZE ELAST 4 BULKY (GAUZE/BANDAGES/DRESSINGS) ×3 IMPLANT
CHLORAPREP W/TINT 26ML (MISCELLANEOUS) ×3 IMPLANT
CORDS BIPOLAR (ELECTRODE) ×3 IMPLANT
COVER MAYO STAND STRL (DRAPES) ×3 IMPLANT
COVER TABLE BACK 60X90 (DRAPES) ×3 IMPLANT
CUFF TOURNIQUET SINGLE 18IN (TOURNIQUET CUFF) ×3 IMPLANT
DECANTER SPIKE VIAL GLASS SM (MISCELLANEOUS) IMPLANT
DRAPE EXTREMITY T 121X128X90 (DRAPE) ×3 IMPLANT
DRAPE OEC MINIVIEW 54X84 (DRAPES) ×3 IMPLANT
DRAPE SURG 17X23 STRL (DRAPES) ×3 IMPLANT
DRSG PAD ABDOMINAL 8X10 ST (GAUZE/BANDAGES/DRESSINGS) IMPLANT
GAUZE XEROFORM 1X8 LF (GAUZE/BANDAGES/DRESSINGS) ×3 IMPLANT
GLOVE BIO SURGEON STRL SZ 6.5 (GLOVE) ×1 IMPLANT
GLOVE BIO SURGEON STRL SZ7.5 (GLOVE) ×3 IMPLANT
GLOVE BIO SURGEONS STRL SZ 6.5 (GLOVE)
GLOVE BIOGEL PI IND STRL 7.0 (GLOVE) IMPLANT
GLOVE BIOGEL PI IND STRL 8 (GLOVE) ×1 IMPLANT
GLOVE BIOGEL PI IND STRL 8.5 (GLOVE) ×1 IMPLANT
GLOVE BIOGEL PI INDICATOR 7.0 (GLOVE) ×2
GLOVE BIOGEL PI INDICATOR 8 (GLOVE) ×2
GLOVE BIOGEL PI INDICATOR 8.5 (GLOVE) ×2
GLOVE ECLIPSE 6.5 STRL STRAW (GLOVE) ×2 IMPLANT
GLOVE SURG ORTHO 8.0 STRL STRW (GLOVE) ×3 IMPLANT
GLOVE SURG SS PI 8.5 STRL IVOR (GLOVE) ×2
GLOVE SURG SS PI 8.5 STRL STRW (GLOVE) ×1 IMPLANT
GOWN STRL REUS W/ TWL LRG LVL3 (GOWN DISPOSABLE) ×1 IMPLANT
GOWN STRL REUS W/TWL LRG LVL3 (GOWN DISPOSABLE) ×3
GOWN STRL REUS W/TWL XL LVL3 (GOWN DISPOSABLE) ×5 IMPLANT
K-WIRE .035X4 (WIRE) IMPLANT
K-WIRE DBL TRONS .035X6 (WIRE) ×3
KWIRE DBL TRONS .035X6 (WIRE) IMPLANT
NDL HYPO 25X1 1.5 SAFETY (NEEDLE) IMPLANT
NDL KEITH (NEEDLE) IMPLANT
NDL SAFETY ECLIPSE 18X1.5 (NEEDLE) IMPLANT
NEEDLE HYPO 18GX1.5 SHARP (NEEDLE)
NEEDLE HYPO 22GX1.5 SAFETY (NEEDLE) IMPLANT
NEEDLE HYPO 25X1 1.5 SAFETY (NEEDLE) ×3 IMPLANT
NEEDLE KEITH (NEEDLE) IMPLANT
NON LOCK SCREW 1.5X20MM (Screw) ×6 IMPLANT
NON LOCK SCREW 1.5X22MM (Screw) ×9 IMPLANT
NON LOCK SCREW 1.5X24MM (Screw) ×6 IMPLANT
NS IRRIG 1000ML POUR BTL (IV SOLUTION) ×3 IMPLANT
PACK BASIN DAY SURGERY FS (CUSTOM PROCEDURE TRAY) ×3 IMPLANT
PAD CAST 3X4 CTTN HI CHSV (CAST SUPPLIES) ×1 IMPLANT
PAD CAST 4YDX4 CTTN HI CHSV (CAST SUPPLIES) ×1 IMPLANT
PADDING CAST ABS 3INX4YD NS (CAST SUPPLIES)
PADDING CAST ABS 4INX4YD NS (CAST SUPPLIES)
PADDING CAST ABS COTTON 3X4 (CAST SUPPLIES) IMPLANT
PADDING CAST ABS COTTON 4X4 ST (CAST SUPPLIES) ×1 IMPLANT
PADDING CAST COTTON 3X4 STRL (CAST SUPPLIES) ×3
PADDING CAST COTTON 4X4 STRL (CAST SUPPLIES) ×3
PASSER SUT SWANSON 36MM LOOP (INSTRUMENTS) IMPLANT
PLATE T SMALL 1.5MM (Plate) ×2 IMPLANT
SCREW 1.5X18MM (Screw) ×3 IMPLANT
SCREW BN 18X1.5XST NONLOCK (Screw) IMPLANT
SCREW NON LOCK 1.5X20MM (Screw) IMPLANT
SCREW NON LOCK 1.5X22MM (Screw) IMPLANT
SCREW NON LOCK 1.5X24MM (Screw) IMPLANT
SLEEVE SCD COMPRESS KNEE MED (MISCELLANEOUS) ×3 IMPLANT
SPLINT FAST PLASTER 5X30 (CAST SUPPLIES)
SPLINT PLASTER CAST FAST 5X30 (CAST SUPPLIES) IMPLANT
SPLINT PLASTER CAST XFAST 3X15 (CAST SUPPLIES) IMPLANT
SPLINT PLASTER CAST XFAST 4X15 (CAST SUPPLIES) IMPLANT
SPLINT PLASTER XTRA FAST SET 4 (CAST SUPPLIES)
SPLINT PLASTER XTRA FASTSET 3X (CAST SUPPLIES) ×40
SPONGE GAUZE 4X4 12PLY (GAUZE/BANDAGES/DRESSINGS) ×3 IMPLANT
STAPLER VISISTAT 35W (STAPLE) IMPLANT
STOCKINETTE 4X48 STRL (DRAPES) ×3 IMPLANT
SUT ETHIBOND 3-0 V-5 (SUTURE) ×6 IMPLANT
SUT ETHILON 3 0 PS 1 (SUTURE) IMPLANT
SUT ETHILON 4 0 PS 2 18 (SUTURE) ×3 IMPLANT
SUT FIBERWIRE 2-0 18 17.9 3/8 (SUTURE)
SUT MERSILENE 2.0 SH NDLE (SUTURE) IMPLANT
SUT MERSILENE 3 0 FS 1 (SUTURE) ×3 IMPLANT
SUT MERSILENE 4 0 P 3 (SUTURE) IMPLANT
SUT SILK 4 0 PS 2 (SUTURE) IMPLANT
SUT VIC AB 0 CT1 27 (SUTURE)
SUT VIC AB 0 CT1 27XBRD ANBCTR (SUTURE) IMPLANT
SUT VIC AB 0 SH 27 (SUTURE) IMPLANT
SUT VIC AB 2-0 SH 27 (SUTURE)
SUT VIC AB 2-0 SH 27XBRD (SUTURE) IMPLANT
SUT VICRYL 4-0 PS2 18IN ABS (SUTURE) ×5 IMPLANT
SUT VICRYL RAPIDE 4/0 PS 2 (SUTURE) ×3 IMPLANT
SUTURE FIBERWR 2-0 18 17.9 3/8 (SUTURE) IMPLANT
SYR BULB 3OZ (MISCELLANEOUS) ×3 IMPLANT
SYR CONTROL 10ML LL (SYRINGE) ×2 IMPLANT
TOWEL OR 17X24 6PK STRL BLUE (TOWEL DISPOSABLE) ×6 IMPLANT
UNDERPAD 30X30 INCONTINENT (UNDERPADS AND DIAPERS) ×3 IMPLANT

## 2014-03-13 NOTE — Op Note (Signed)
Intra-operative fluoroscopic images in the AP, lateral, and oblique views were taken and evaluated by myself.  Reduction and hardware placement were confirmed.  There was no intraarticular penetration of permanent hardware.  

## 2014-03-13 NOTE — Discharge Instructions (Addendum)
° ° ° ° °  Post Anesthesia Home Care Instructions ° °Activity: °Get plenty of rest for the remainder of the day. A responsible adult should stay with you for 24 hours following the procedure.  °For the next 24 hours, DO NOT: °-Drive a car °-Operate machinery °-Drink alcoholic beverages °-Take any medication unless instructed by your physician °-Make any legal decisions or sign important papers. ° °Meals: °Start with liquid foods such as gelatin or soup. Progress to regular foods as tolerated. Avoid greasy, spicy, heavy foods. If nausea and/or vomiting occur, drink only clear liquids until the nausea and/or vomiting subsides. Call your physician if vomiting continues. ° °Special Instructions/Symptoms: °Your throat may feel dry or sore from the anesthesia or the breathing tube placed in your throat during surgery. If this causes discomfort, gargle with warm salt water. The discomfort should disappear within 24 hours. ° °Call your surgeon if you experience:  ° °1.  Fever over 101.0. °2.  Inability to urinate. °3.  Nausea and/or vomiting. °4.  Extreme swelling or bruising at the surgical site. °5.  Continued bleeding from the incision. °6.  Increased pain, redness or drainage from the incision. °7.  Problems related to your pain medication. °[facility] Instructions °Hand Surgery ° °Wound Care: °Keep your hand elevated above the level of your heart.  Do not allow it to dangle by your side.  Keep the dressing dry and do not remove it unless your doctor advises you to do so.  He will usually change it at the time of your post-op visit.  Moving your fingers is advised to stimulate circulation but will depend on the site of your surgery.  If you have a splint applied, your doctor will advise you regarding movement. ° °Activity: °Do not drive or operate machinery today.  Rest today and then you may return to your normal activity and work as indicated by your physician. ° °Diet:  °Drink liquids today or eat a light diet.  You  may resume a regular diet tomorrow.   ° °General expectations: °Pain for two to three days. °Fingers may become slightly swollen. ° °Call your doctor if any of the following occur: °Severe pain not relieved by pain medication. °Elevated temperature. °Dressing soaked with blood. °Inability to move fingers. °White or bluish color to fingers. °

## 2014-03-13 NOTE — Op Note (Signed)
[id number] 

## 2014-03-13 NOTE — Op Note (Signed)
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  PROCEDURE:   1. Right proximal row carpectomy 2. Posterior interosseous nerve neurectomy 3. Radial styloidectomy 4. Fusion of right thumb CMC joint.  SURGEON:  Leanora Cover, M.D.  ASSISTANT:  Daryll Brod, M.D.  ANESTHESIA:  General with regional.  IV FLUIDS:  Per Anesthesia flow sheet.  ESTIMATED BLOOD LOSS:  Minimal.  COMPLICATIONS:  None.  SPECIMENS:  None.  TOURNIQUET TIME:  100 minutes.  DISPOSITION:  Stable to PACU.  INDICATIONS:  Russell [name] is a 73 year old male who has had pain in bilateral wrists and base of his thumbs for many years.  He has tried anti-inflammatory splinting injections without lasting relief.  He wished to have a proximal row carpectomy and CMC joint fusion for management of pain.  Risks, benefits, and alternatives of surgery were discussed including risk of blood loss; infection; damage to nerves, vessels, tendons, ligaments, bone; failure of surgery; need for additional surgery; complications with wound healing; continued pain, nonunion, malunion, stiffness.  He voiced understanding of these risks and elected to proceed.  OPERATIVE COURSE:  After being identified preoperatively by myself, the patient and I agreed upon procedure and site of procedure.  Surgical site was marked.  The risks, benefits, and alternatives of surgery were reviewed and he wished to proceed.  Surgical consent had been signed. He was given IV Ancef as preoperative antibiotic  prophylaxis.  Regional block was performed by Anesthesia in preoperative holding.  He was transferred to the operating room and placed on operating table in supine position with the right upper extremity on arm board.  General anesthesia was induced by Anesthesiology.  The right upper extremity was prepped and draped in normal sterile orthopedic fashion.  Surgical pause was performed between surgeons, Anesthesia, and operating staff, and all were in agreement as to the patient, procedure, and site of procedure. Tourniquet at the proximal aspect of the extremity was inflated to 250 mmHg after exsanguination of the limb with an Esmarch bandage.  Incision was made at the dorsum of the wrist and carried into subcutaneous tissues by spreading technique.  Bipolar electrocautery was used to obtain hemostasis.  One vessel had to be tied.  The extensor retinaculum was incised and the 4th dorsal compartment entered.  The posterior interosseous nerve was identified and resected, approximately 2 cm was resected.  The capsule was incised and T'd at its base.  Enough cuff was left for repair at the end of the case.  The carpus was identified and the articular surface was visualized.  There was denudation of the bone at the scaphoid facet.  The lunate facet appeared to have good remaining cartilage as did the base of  the capitate.  The scaphoid and triquetrum were removed first with a combination of Beaver blade, Soil scientist, and curette as well as the rongeur.  Care was taken to ensure complete removal.  The radioscaphocapitate ligament was preserved.  The lunate was then removed.  The capitate fell down into the lunate facet appropriately.  The wrist was placed through radial and ulnar deviation. There was some impingement at the radial styloid.  A radial styloidectomy was performed preserving origin of the radioscaphocapitate ligament.  The rongeur was used.  Attention was turned to the thumb  CMC joint.  Incision was made at the dorsum of the thumb CMC joint, again carried into subcutaneous tissues by spreading technique.  Bipolar electrocautery was used to obtain hemostasis.  The interval between the APL and EPB tendons was created.  The capsule was incised.  The periosteum was elevated with the Soil scientist and the Lenox blade. There were 2 osteophytes which were removed.  The joint was entered.  It was arthritic.  The articular cartilage was removed with a combination of rongeur, osteotome, and curette.  The carpal bones that were removed were ground up for bone graft.  The [facility] and radiocarpal joints were then copiously irrigated with sterile saline.  Bone graft was placed into the [facility] joint.  A T-plate from the ALPS set was selected and secured to the bone with the guide pins.  The thumb was placed in appropriate position to allow apposition.  The C-arm was used in AP and lateral projections to ensure appropriate position of the thumb and position of the hardware which was the case.  Standard AO drilling and measuring technique was used to fill all holes in the plate.  Good purchase was obtained in most screws and acceptable purchase in all.  C- arm was used in AP, lateral, and oblique projections to ensure appropriate position of hardware which was the case.  There was no intra- articular penetration.  The wounds were copiously irrigated with sterile saline.  The periosteum was repaired at the [facility] joint with 4-0 Vicryl suture and the skin closed with 4-0 nylon in a horizontal mattress fashion.  At the wrist, the capsule was closed with the 4-0 Vicryl suture as was the retinaculum.  Running fashion was used for the 4-0 Vicryl suture.  The skin was closed with 2 inverted interrupted Vicryl Sutures in the subcutaneous tissues and 4-0 nylon and the skin in a horizontal mattress fashion.  The wounds were dressed with sterile Xeroform, 4x4s, and wrapped with Kerlix bandage.   A splint was placed volarly and a thumb spica portion wrapping around into the dorsum of the forearm.  This was wrapped with Kerlix and Ace bandage.  The C-arm was used in AP and lateral projections to ensure appropriate position of the capitate and lunate facet which was the case.  The tourniquet was deflated at 100 minutes.  Fingertips were pink with brisk capillary refill after deflation of tourniquet.  The operative drapes were broken down.  The patient was awakened from anesthesia safely.  He was transferred back to stretcher and taken to PACU in stable condition.  I will see him back in the office in 1 week for postoperative followup.  I will give him Percocet 5/325, 1-2 p.o. q.6 hours p.r.n. pain, dispensed #40.     Leanora Cover, MD     KK/MEDQ  D:  03/13/2014  T:  03/13/2014  Job:  [id number]

## 2014-03-13 NOTE — Progress Notes (Signed)
Assisted Dr. Fitzgerald with right, ultrasound guided, supraclavicular block. Side rails up, monitors on throughout procedure. See vital signs in flow sheet. Tolerated Procedure well. 

## 2014-03-13 NOTE — Anesthesia Procedure Notes (Addendum)
Anesthesia Regional Block:  Supraclavicular block  Pre-Anesthetic Checklist: ,, timeout performed, Correct Patient, Correct Site, Correct Laterality, Correct Procedure, Correct Position, site marked, Risks and benefits discussed, pre-op evaluation, post-op pain management  Laterality: Right  Prep: Maximum Sterile Barrier Precautions used and chloraprep       Needles:  Injection technique: Single-shot  Needle Type: Echogenic Stimulator Needle     Needle Length: 5cm 5 cm Needle Gauge: 22 and 22 G    Additional Needles:  Procedures: ultrasound guided (picture in chart) Supraclavicular block Narrative:  Start time: 03/13/2014 8:36 AM End time: 03/13/2014 8:44 AM Injection made incrementally with aspirations every 5 mL. Anesthesiologist: Fitzgerald,MD  Additional Notes: 2% Lidocaine skin wheel. Intercostobrachial block with 8cc of 0.5% Bupivicaine plain.   Procedure Name: LMA Insertion Date/Time: 03/13/2014 9:37 AM Performed by: Toula Moos L Pre-anesthesia Checklist: Patient identified, Emergency Drugs available, Suction available, Patient being monitored and Timeout performed Patient Re-evaluated:Patient Re-evaluated prior to inductionOxygen Delivery Method: Circle System Utilized Preoxygenation: Pre-oxygenation with 100% oxygen Intubation Type: IV induction Ventilation: Mask ventilation without difficulty LMA: LMA inserted LMA Size: 5.0 Number of attempts: 1 Airway Equipment and Method: bite block Placement Confirmation: positive ETCO2 and breath sounds checked- equal and bilateral Tube secured with: Tape Dental Injury: Teeth and Oropharynx as per pre-operative assessment  Comments: Lower denture removed prior to LMA insertion.  Denture placed into cup and labeled with patient sticker.  No damage to denture.  Jacklynn Barnacle, CRNA

## 2014-03-13 NOTE — Anesthesia Preprocedure Evaluation (Signed)
Anesthesia Evaluation  Patient identified by MRN, date of birth, ID band Patient awake    Reviewed: Allergy & Precautions, H&P , NPO status , Patient's Chart, lab work & pertinent test results  Airway Mallampati: II TM Distance: >3 FB Neck ROM: Full    Dental no notable dental hx. (+) Teeth Intact, Lower Dentures, Dental Advisory Given   Pulmonary COPDCurrent Smoker,  breath sounds clear to auscultation  Pulmonary exam normal       Cardiovascular hypertension, On Medications Rhythm:Regular Rate:Normal     Neuro/Psych Seizures -, Well Controlled,  PSYCHIATRIC DISORDERS  Neuromuscular disease    GI/Hepatic Neg liver ROS, GERD-  Medicated and Controlled,  Endo/Other  diabetes, Type 2, Oral Hypoglycemic Agents  Renal/GU negative Renal ROS  negative genitourinary   Musculoskeletal   Abdominal   Peds  Hematology negative hematology ROS (+) anemia ,   Anesthesia Other Findings   Reproductive/Obstetrics negative OB ROS                           Anesthesia Physical Anesthesia Plan  ASA: III  Anesthesia Plan: General and Regional   Post-op Pain Management:    Induction: Intravenous  Airway Management Planned: LMA  Additional Equipment:   Intra-op Plan:   Post-operative Plan: Extubation in OR  Informed Consent: I have reviewed the patients History and Physical, chart, labs and discussed the procedure including the risks, benefits and alternatives for the proposed anesthesia with the patient or authorized representative who has indicated his/her understanding and acceptance.   Dental advisory given  Plan Discussed with: CRNA  Anesthesia Plan Comments:         Anesthesia Quick Evaluation

## 2014-03-13 NOTE — Transfer of Care (Signed)
Immediate Anesthesia Transfer of Care Note  Patient: Russell [name]  Procedure(s) Performed: Procedure(s): RIGHT  PROXIMAL ROW CARPECTOMY (Right) RIGHT FUSION OF THUMB CARPOMETACARPAL (CMC) JOINT (Right)  Patient Location: PACU  Anesthesia Type:General  Level of Consciousness: sedated  Airway & Oxygen Therapy: Patient Spontanous Breathing and Patient connected to face mask oxygen  Post-op Assessment: Report given to PACU RN  Post vital signs: Reviewed and stable  Complications: No apparent anesthesia complications

## 2014-03-13 NOTE — Anesthesia Postprocedure Evaluation (Signed)
  Anesthesia Post-op Note  Patient: Russell [name]  Procedure(s) Performed: Procedure(s): RIGHT  PROXIMAL ROW CARPECTOMY (Right) RIGHT FUSION OF THUMB CARPOMETACARPAL (CMC) JOINT (Right)  Patient Location: PACU  Anesthesia Type:General and block  Level of Consciousness: awake and alert   Airway and Oxygen Therapy: Patient Spontanous Breathing  Post-op Pain: none  Post-op Assessment: Post-op Vital signs reviewed, Patient's Cardiovascular Status Stable and Respiratory Function Stable  Post-op Vital Signs: Reviewed  Filed Vitals:   03/13/14 1245  BP: 156/84  Pulse: 54  Temp:   Resp: 12    Complications: No apparent anesthesia complications

## 2014-03-13 NOTE — Brief Op Note (Signed)
03/13/2014  11:43 AM  PATIENT:  Russell [name]  73 y.o. male  PRE-OPERATIVE DIAGNOSIS:  RIGHT CMC ARTHRITIS, RADIOSCAPHOID ARTHRITIS  POST-OPERATIVE DIAGNOSIS:  RIGHT CMC ARTHRITIS, RADIOSCAPHOID ARTHRITIS  PROCEDURE:  Procedure(s): RIGHT  PROXIMAL ROW CARPECTOMY (Right) RIGHT FUSION OF THUMB CARPOMETACARPAL (CMC) JOINT (Right) Posterior interosseous nerve neurectomy Radial styloidectomy  SURGEON:  Surgeon(s) and Role:    * Tennis Must, MD - Primary    * Wynonia Sours, MD - Assisting  PHYSICIAN ASSISTANT:   ASSISTANTS: Daryll Brod, MD   ANESTHESIA:   regional and general  EBL:  Total I/O In: 1600 [I.V.:1600] Out: -   BLOOD ADMINISTERED:none  DRAINS: none   LOCAL MEDICATIONS USED:  NONE  SPECIMEN:  No Specimen  DISPOSITION OF SPECIMEN:  N/A  COUNTS:  YES  TOURNIQUET:   Total Tourniquet Time Documented: Upper Arm (Right) - 100 minutes Total: Upper Arm (Right) - 100 minutes   DICTATION: .Other Dictation: Dictation Number [id number]  PLAN OF CARE: Discharge to home after PACU  PATIENT DISPOSITION:  PACU - hemodynamically stable.

## 2014-03-13 NOTE — H&P (Signed)
Russell [name] is an 73 y.o. male.   Chief Complaint: right wrist pain HPI: 73 yo rhd male with 5-6 years of pain in bilateral wrists.  He has tried splinted, antiinflammatories, and injections without lasting relief.  Radiographs show degenerative changes at radio scaphoid and cmc joints.  He wishes to have surgical intervention for management of the pain.  Past Medical History  Diagnosis Date  . Scabies   . DIABETES MELLITUS, TYPE II, CONTROLLED   . HYPERCHOLESTEROLEMIA, MIXED   . ANEMIA, IRON DEFICIENCY NOS   . ERECTILE DYSFUNCTION   . CATARACTS, BILATERAL   . HYPERTENSION   . COPD   . G E R D   . DIVERTICULOSIS, COLON   . MILD SPINAL STENOSIS, CERVICAL   . DYSPHAGIA UNSPECIFIED   . SEIZURE, GRAND MAL   . BENIGN PROSTATIC HYPERTROPHY, WITH URINARY OBSTRUCTION   . HTN (hypertension)   . Mitral regurgitation   . Glaucoma   . Depression   . Seizures     cannot remember how long ago-several years  . GERD (gastroesophageal reflux disease)     Past Surgical History  Procedure Laterality Date  . Knee arthroscopy  2008    rt  . Intracervical discectomy with fusion  c4-5 and c5-6--dr. cram    . Capsulotomy Right 01/26/2013    Procedure: MINOR CAPSULOTOMY;  Surgeon: Myrtha Mantis., MD;  Location: [place];  Service: Ophthalmology;  Laterality: Right;  . Yag laser application Right 0/08/6282    Procedure: YAG LASER APPLICATION;  Surgeon: Myrtha Mantis., MD;  Location: [place];  Service: Ophthalmology;  Laterality: Right;  . Eye surgery      cataracts  . Shoulder arthroscopy  2012    left-bicept tendon repair-DSC  . Laryngoscopy with dilatation      2011-[facility]  . I&d extremity Right 05/24/2013    Procedure: RIGHT INDEX WOUND DEBRIDEMENT AND CLOSURE;  Surgeon: Jolyn Nap, MD;  Location: [facility];  Service: Orthopedics;  Laterality: Right;  . Foot surgery Left     Family History  Problem Relation Age of Onset  . Diabetes    . Cancer      Social History:  reports that he has been smoking Cigarettes.  He has been smoking about 1.00 pack per day. He has never used smokeless tobacco. He reports that he does not drink alcohol or use illicit drugs.  Allergies: No Known Allergies  Medications Prior to Admission  Medication Sig Dispense Refill  . acetaZOLAMIDE (DIAMOX) 500 MG capsule Take 500 mg by mouth daily.       Marland Kitchen albuterol (PROVENTIL HFA;VENTOLIN HFA) 108 (90 BASE) MCG/ACT inhaler Inhale 2 puffs into the lungs every 6 (six) hours as needed for wheezing or shortness of breath.      Marland Kitchen amitriptyline (ELAVIL) 10 MG tablet Take 10 mg by mouth at bedtime.      . bimatoprost (LUMIGAN) 0.01 % SOLN Place 1 drop into both eyes at bedtime.  30 mL  3  . brimonidine-timolol (COMBIGAN) 0.2-0.5 % ophthalmic solution Place 1 drop into both eyes 2 (two) times daily.       . chlorhexidine (PERIDEX) 0.12 % solution Use as directed in the mouth or throat 3 (three) times daily.       . diclofenac sodium (VOLTAREN) 1 % GEL Apply 1 application topically 3 (three) times daily as needed (pain).      . dorzolamide (TRUSOPT) 2 % ophthalmic solution Place 1 drop  into the left eye 3 (three) times daily.       . DULoxetine (CYMBALTA) 60 MG capsule Take 60 mg by mouth 2 (two) times daily.      Marland Kitchen esomeprazole (NEXIUM) 40 MG capsule Take 1 capsule (40 mg total) by mouth 2 (two) times daily.  30 capsule  3  . gabapentin (NEURONTIN) 600 MG tablet Take 1 tablet (600 mg total) by mouth 2 (two) times daily.  60 tablet  0  . hydrochlorothiazide (HYDRODIURIL) 12.5 MG tablet Take 12.5 mg by mouth daily.      Marland Kitchen losartan (COZAAR) 100 MG tablet Take 1 tablet (100 mg total) by mouth daily.  30 tablet  3  . Multiple Vitamin (MULTIVITAMIN WITH MINERALS) TABS tablet Take 1 tablet by mouth daily. Centrum Silver      . oxybutynin (DITROPAN-XL) 10 MG 24 hr tablet Take 1 tablet (10 mg total) by mouth at bedtime.  30 tablet  3  . oxyCODONE-acetaminophen (PERCOCET) 10-325 MG per  tablet Take 1 tablet by mouth every 4 (four) hours as needed for pain.      . pravastatin (PRAVACHOL) 80 MG tablet Take 1 tablet (80 mg total) by mouth daily.  30 tablet  3  . tamsulosin (FLOMAX) 0.4 MG CAPS capsule Take 0.4 mg by mouth daily.       . traMADol (ULTRAM) 50 MG tablet Take by mouth every 6 (six) hours as needed.        Results for orders placed during the hospital encounter of 03/13/14 (from the past 48 hour(s))  BASIC METABOLIC PANEL     Status: Abnormal   Collection Time    03/12/14 11:30 AM      Result Value Ref Range   Sodium 140  137 - 147 mEq/L   Potassium 4.1  3.7 - 5.3 mEq/L   Chloride 102  96 - 112 mEq/L   CO2 25  19 - 32 mEq/L   Glucose, Bld 82  70 - 99 mg/dL   BUN 11  6 - 23 mg/dL   Creatinine, Ser 1.11  0.50 - 1.35 mg/dL   Calcium 9.4  8.4 - 10.5 mg/dL   GFR calc non Af Amer 64 (*) >90 mL/min   GFR calc Af Amer 74 (*) >90 mL/min   Comment: (NOTE)     The eGFR has been calculated using the CKD EPI equation.     This calculation has not been validated in all clinical situations.     eGFR's persistently <90 mL/min signify possible Chronic Kidney     Disease.    No results found.   A comprehensive review of systems was negative.  Blood pressure 169/92, pulse 57, temperature 97.5 F (36.4 C), temperature source Oral, resp. rate 16, height 6' 2"  (1.88 m), weight 72.689 kg (160 lb 4 oz), SpO2 100.00%.  General appearance: alert, cooperative and appears stated age Head: Normocephalic, without obvious abnormality, atraumatic Neck: supple, symmetrical, trachea midline Resp: clear to auscultation bilaterally Cardio: regular rate and rhythm GI: non tender Extremities: intact sensation and capillary refill all digits.  +epl/fpl/io.  ttp cmc and radial side of wrist.  no wounds. Pulses: 2+ and symmetric Skin: Skin color, texture, turgor normal. No rashes or lesions Neurologic: Grossly normal Incision/Wound: none  Assessment/Plan Right radioscaphoid and  cmc osteoarthritis.  Non operative and operative treatment options were discussed with the patient and patient wishes to proceed with operative treatment. Plan proximal row carpectomy and thumb cmc fusion.  Risks, benefits, and  alternatives of surgery were discussed and the patient agrees with the plan of care.   Luka Stohr R 03/13/2014, 8:24 AM

## 2014-03-14 ENCOUNTER — Encounter (HOSPITAL_BASED_OUTPATIENT_CLINIC_OR_DEPARTMENT_OTHER): Payer: Self-pay | Admitting: Orthopedic Surgery

## 2014-03-21 DIAGNOSIS — R569 Unspecified convulsions: Secondary | ICD-10-CM

## 2014-03-21 HISTORY — DX: Unspecified convulsions: R56.9

## 2014-03-22 ENCOUNTER — Encounter (HOSPITAL_COMMUNITY): Payer: Self-pay | Admitting: Emergency Medicine

## 2014-03-22 ENCOUNTER — Emergency Department (HOSPITAL_COMMUNITY): Payer: PRIVATE HEALTH INSURANCE

## 2014-03-22 ENCOUNTER — Emergency Department (HOSPITAL_COMMUNITY)
Admission: EM | Admit: 2014-03-22 | Discharge: 2014-03-22 | Disposition: A | Discharge status: Home / Self Care | Payer: PRIVATE HEALTH INSURANCE | Attending: Emergency Medicine | Admitting: Emergency Medicine

## 2014-03-22 DIAGNOSIS — N138 Other obstructive and reflux uropathy: Secondary | ICD-10-CM | POA: Insufficient documentation

## 2014-03-22 DIAGNOSIS — I10 Essential (primary) hypertension: Secondary | ICD-10-CM | POA: Insufficient documentation

## 2014-03-22 DIAGNOSIS — Z8619 Personal history of other infectious and parasitic diseases: Secondary | ICD-10-CM | POA: Insufficient documentation

## 2014-03-22 DIAGNOSIS — H409 Unspecified glaucoma: Secondary | ICD-10-CM | POA: Insufficient documentation

## 2014-03-22 DIAGNOSIS — F17200 Nicotine dependence, unspecified, uncomplicated: Secondary | ICD-10-CM | POA: Insufficient documentation

## 2014-03-22 DIAGNOSIS — E119 Type 2 diabetes mellitus without complications: Secondary | ICD-10-CM | POA: Insufficient documentation

## 2014-03-22 DIAGNOSIS — N401 Enlarged prostate with lower urinary tract symptoms: Secondary | ICD-10-CM | POA: Insufficient documentation

## 2014-03-22 DIAGNOSIS — J4489 Other specified chronic obstructive pulmonary disease: Secondary | ICD-10-CM | POA: Insufficient documentation

## 2014-03-22 DIAGNOSIS — Z862 Personal history of diseases of the blood and blood-forming organs and certain disorders involving the immune mechanism: Secondary | ICD-10-CM | POA: Insufficient documentation

## 2014-03-22 DIAGNOSIS — G40909 Epilepsy, unspecified, not intractable, without status epilepticus: Secondary | ICD-10-CM | POA: Insufficient documentation

## 2014-03-22 DIAGNOSIS — E1165 Type 2 diabetes mellitus with hyperglycemia: Secondary | ICD-10-CM | POA: Diagnosis present

## 2014-03-22 DIAGNOSIS — R112 Nausea with vomiting, unspecified: Secondary | ICD-10-CM

## 2014-03-22 DIAGNOSIS — E860 Dehydration: Secondary | ICD-10-CM | POA: Insufficient documentation

## 2014-03-22 DIAGNOSIS — E7800 Pure hypercholesterolemia, unspecified: Secondary | ICD-10-CM | POA: Insufficient documentation

## 2014-03-22 DIAGNOSIS — K219 Gastro-esophageal reflux disease without esophagitis: Secondary | ICD-10-CM | POA: Insufficient documentation

## 2014-03-22 DIAGNOSIS — F3289 Other specified depressive episodes: Secondary | ICD-10-CM | POA: Insufficient documentation

## 2014-03-22 DIAGNOSIS — I959 Hypotension, unspecified: Secondary | ICD-10-CM | POA: Diagnosis present

## 2014-03-22 DIAGNOSIS — Z79899 Other long term (current) drug therapy: Secondary | ICD-10-CM | POA: Insufficient documentation

## 2014-03-22 DIAGNOSIS — F329 Major depressive disorder, single episode, unspecified: Secondary | ICD-10-CM | POA: Insufficient documentation

## 2014-03-22 DIAGNOSIS — J449 Chronic obstructive pulmonary disease, unspecified: Secondary | ICD-10-CM | POA: Insufficient documentation

## 2014-03-22 LAB — CBC WITH DIFFERENTIAL/PLATELET
BASOS ABS: 0.1 10*3/uL (ref 0.0–0.1)
BASOS PCT: 1 % (ref 0–1)
EOS ABS: 0.3 10*3/uL (ref 0.0–0.7)
Eosinophils Relative: 3 % (ref 0–5)
HCT: 32 % — ABNORMAL LOW (ref 39.0–52.0)
HEMOGLOBIN: 10.2 g/dL — AB (ref 13.0–17.0)
Lymphocytes Relative: 32 % (ref 12–46)
Lymphs Abs: 3.2 10*3/uL (ref 0.7–4.0)
MCH: 25.1 pg — AB (ref 26.0–34.0)
MCHC: 31.9 g/dL (ref 30.0–36.0)
MCV: 78.6 fL (ref 78.0–100.0)
MONOS PCT: 8 % (ref 3–12)
Monocytes Absolute: 0.8 10*3/uL (ref 0.1–1.0)
NEUTROS PCT: 56 % (ref 43–77)
Neutro Abs: 5.5 10*3/uL (ref 1.7–7.7)
Platelets: 329 10*3/uL (ref 150–400)
RBC: 4.07 MIL/uL — ABNORMAL LOW (ref 4.22–5.81)
RDW: 17.4 % — AB (ref 11.5–15.5)
WBC: 9.9 10*3/uL (ref 4.0–10.5)

## 2014-03-22 LAB — COMPREHENSIVE METABOLIC PANEL
ALBUMIN: 3.2 g/dL — AB (ref 3.5–5.2)
ALT: 7 U/L (ref 0–53)
AST: 11 U/L (ref 0–37)
Alkaline Phosphatase: 62 U/L (ref 39–117)
BUN: 11 mg/dL (ref 6–23)
CO2: 22 mEq/L (ref 19–32)
CREATININE: 1.19 mg/dL (ref 0.50–1.35)
Calcium: 8.6 mg/dL (ref 8.4–10.5)
Chloride: 103 mEq/L (ref 96–112)
GFR calc Af Amer: 68 mL/min — ABNORMAL LOW (ref >90)
GFR calc non Af Amer: 59 mL/min — ABNORMAL LOW (ref >90)
Glucose, Bld: 91 mg/dL (ref 70–99)
Potassium: 3.9 mEq/L (ref 3.7–5.3)
Sodium: 139 mEq/L (ref 137–147)
TOTAL PROTEIN: 6.6 g/dL (ref 6.0–8.3)
Total Bilirubin: <0.2 mg/dL — ABNORMAL LOW (ref 0.3–1.2)

## 2014-03-22 LAB — I-STAT CG4 LACTIC ACID, ED: LACTIC ACID, VENOUS: 3.27 mmol/L — AB (ref 0.5–2.2)

## 2014-03-22 LAB — I-STAT TROPONIN, ED: TROPONIN I, POC: 0 ng/mL (ref 0.00–0.08)

## 2014-03-22 LAB — LIPASE, BLOOD: LIPASE: 21 U/L (ref 11–59)

## 2014-03-22 MED ORDER — SODIUM CHLORIDE 0.9 % IV BOLUS (SEPSIS)
1000.0000 mL | Freq: Once | INTRAVENOUS | Status: AC
  Administered 2014-03-22: 1000 mL via INTRAVENOUS

## 2014-03-22 NOTE — ED Notes (Addendum)
Pt in via EMS from home, pt was c/o chest pain at home and then had possible seizure like activity, family reports "head bobbing", then pt was unresponsive after episode, pt with history of seizures in the past, upon EMS arrival pt was responsive to pain and hypotensive at 56/32, IV initiated and bolus started, upon arrival to ED pt still answering questions slowly but is alert and oriented, admits to some ETOH use, BP improved. MD to bedside upon arrival. Pt denies pain at this time.

## 2014-03-22 NOTE — Discharge Instructions (Signed)
Hypotension As your heart beats, it forces blood through your arteries. This force is your blood pressure. If your blood pressure is too low for you to go about your normal activities or to support the organs of your body, you have hypotension. Hypotension is also referred to as low blood pressure. When your blood pressure becomes too low, you may not get enough blood to your brain. As a result, you may feel weak, feel lightheaded, or develop a rapid heart rate. In a more severe case, you may faint. CAUSES Various conditions can cause hypotension. These include:  Blood loss.  Dehydration.  Heart or endocrine problems.  Pregnancy.  Severe infection.  Not having a well-balanced diet filled with needed nutrients.  Severe allergic reactions (anaphylaxis). Some medicines, such as blood pressure medicine or water pills (diuretics), may lower your blood pressure below normal. Sometimes taking too much medicine or taking medicine not as directed can cause hypotension. TREATMENT  Hospitalization is sometimes required for hypotension if fluid or blood replacement is needed, if time is needed for medicines to wear off, or if further monitoring is needed. Treatment might include changing your diet, changing your medicines (including medicines aimed at raising your blood pressure), and use of support stockings. HOME CARE INSTRUCTIONS   Drink enough fluids to keep your urine clear or pale yellow.  Take your medicines as directed by your health care provider.  Get up slowly from reclining or sitting positions. This gives your blood pressure a chance to adjust.  Wear support stockings as directed by your health care provider.  Maintain a healthy diet by including nutritious food, such as fruits, vegetables, nuts, whole grains, and lean meats. SEEK MEDICAL CARE IF:  You have vomiting or diarrhea.  You have a fever for more than 2 3 days.  You feel more thirsty than usual.  You feel weak and  tired. SEEK IMMEDIATE MEDICAL CARE IF:   You have chest pain or a fast or irregular heartbeat.  You have a loss of feeling in some part of your body, or you lose movement in your arms or legs.  You have trouble speaking.  You become sweaty or feel lightheaded.  You faint. MAKE SURE YOU:   Understand these instructions.  Will watch your condition.  Will get help right away if you are not doing well or get worse. Document Released: 12/07/2005 Document Revised: 09/27/2013 Document Reviewed: 06/09/2013 [employer] Patient Information 2014 [employer].  

## 2014-03-22 NOTE — ED Notes (Signed)
Pt had surgery on his right wrist two days ago.

## 2014-03-22 NOTE — Consult Note (Signed)
CARDIOLOGY CONSULT NOTE   Patient ID: Russell [name] MRN: [id number] DOB/AGE: 1941-01-10 73 y.o.  Admit date: 03/22/2014  Primary Physician   Marlou Sa ERIC, MD Primary Cardiologist  New Reason for Consultation   Possible CGS, abnl ECG  Russell [name] is a 73 y.o. male with no history of CAD. He had sudden onset of dizziness this am. He also had some chest pain, unable to rate it. There was an episode of decreased LOC, but no fall or injury. When his symptoms did not resolve, EMS was called. In the ER, he was initially hypotensive and his BP/HR was low.  His ECG is abnormal but he is not currently in acute pain and not SOB. SBP is 90s and patient appears very weak, but the cause is unclear. SBP when he was here 03/24 for surgery, was > 140, but HR was in the 50s then.   Of note, he had carpal tunnel surgery on his right wrist 03/24. No other recent events, illnesses and he felt he was recovering well until today.  Past Medical History  Diagnosis Date  . Scabies   . DIABETES MELLITUS, TYPE II, CONTROLLED   . HYPERCHOLESTEROLEMIA, MIXED   . ANEMIA, IRON DEFICIENCY NOS   . ERECTILE DYSFUNCTION   . CATARACTS, BILATERAL   . HYPERTENSION   . COPD   . G E R D   . DIVERTICULOSIS, COLON   . MILD SPINAL STENOSIS, CERVICAL   . DYSPHAGIA UNSPECIFIED   . SEIZURE, GRAND MAL   . BENIGN PROSTATIC HYPERTROPHY, WITH URINARY OBSTRUCTION   . HTN (hypertension)   . Mitral regurgitation   . Glaucoma   . Depression   . Seizures     cannot remember how long ago-several years  . GERD (gastroesophageal reflux disease)      Past Surgical History  Procedure Laterality Date  . Knee arthroscopy  2008    rt  . Intracervical discectomy with fusion  c4-5 and c5-6--dr. cram    . Capsulotomy Right 01/26/2013    Procedure: MINOR CAPSULOTOMY;  Surgeon: Myrtha Mantis., MD;  Location: [place];  Service: Ophthalmology;  Laterality: Right;  . Yag laser application Right 01/29/9370   Procedure: YAG LASER APPLICATION;  Surgeon: Myrtha Mantis., MD;  Location: [place];  Service: Ophthalmology;  Laterality: Right;  . Eye surgery      cataracts  . Shoulder arthroscopy  2012    left-bicept tendon repair-DSC  . Laryngoscopy with dilatation      2011-DSC  . I&d extremity Right 05/24/2013    Procedure: RIGHT INDEX WOUND DEBRIDEMENT AND CLOSURE;  Surgeon: Jolyn Nap, MD;  Location: [place];  Service: Orthopedics;  Laterality: Right;  . Foot surgery Left   . Carpectomy Right 03/13/2014    Procedure: RIGHT  PROXIMAL ROW CARPECTOMY;  Surgeon: Tennis Must, MD;  Location: [place];  Service: Orthopedics;  Laterality: Right;  . Carpometacarpal (cmc) fusion of thumb Right 03/13/2014    Procedure: RIGHT FUSION OF THUMB CARPOMETACARPAL [facility]) JOINT;  Surgeon: Tennis Must, MD;  Location: [place];  Service: Orthopedics;  Laterality: Right;    No Known Allergies  I have reviewed the patient's current medications Medication Sig  acetaZOLAMIDE (DIAMOX) 500 MG capsule Take 500 mg by mouth daily.   albuterol (PROVENTIL HFA;VENTOLIN HFA) 108 (90 BASE) MCG/ACT inhaler Inhale 2 puffs into the lungs every 6 (six) hours as needed for wheezing or  shortness of breath.  amitriptyline (ELAVIL) 10 MG tablet Take 10 mg by mouth at bedtime.  bimatoprost (LUMIGAN) 0.01 % SOLN Place 1 drop into both eyes at bedtime.  brimonidine-timolol (COMBIGAN) 0.2-0.5 % ophthalmic solution Place 1 drop into both eyes 2 (two) times daily.   chlorhexidine (PERIDEX) 0.12 % solution Use as directed in the mouth or throat 3 (three) times daily.   diclofenac sodium (VOLTAREN) 1 % GEL Apply 1 application topically 3 (three) times daily as needed (pain).  dorzolamide (TRUSOPT) 2 % ophthalmic solution Place 1 drop into the left eye 3 (three) times daily.   DULoxetine (CYMBALTA) 60 MG capsule Take 60 mg by mouth 2 (two) times daily.  esomeprazole (NEXIUM) 40 MG  capsule Take 1 capsule (40 mg total) by mouth 2 (two) times daily.  gabapentin (NEURONTIN) 600 MG tablet Take 1 tablet (600 mg total) by mouth 2 (two) times daily.  hydrochlorothiazide (HYDRODIURIL) 12.5 MG tablet Take 12.5 mg by mouth daily.  losartan (COZAAR) 100 MG tablet Take 1 tablet (100 mg total) by mouth daily.  Multiple Vitamin (MULTIVITAMIN WITH MINERALS) TABS tablet Take 1 tablet by mouth daily. Centrum Silver  oxybutynin (DITROPAN-XL) 10 MG 24 hr tablet Take 1 tablet (10 mg total) by mouth at bedtime.  oxyCODONE-acetaminophen (PERCOCET) 10-325 MG per tablet Take 1 tablet by mouth every 4 (four) hours as needed for pain.  oxyCODONE-acetaminophen (PERCOCET) 5-325 MG per tablet 1-2 tabs po q6 hours prn pain  pravastatin (PRAVACHOL) 80 MG tablet Take 1 tablet (80 mg total) by mouth daily.  tamsulosin (FLOMAX) 0.4 MG CAPS capsule Take 0.4 mg by mouth daily.   traMADol (ULTRAM) 50 MG tablet Take by mouth every 6 (six) hours as needed.     History   Social History  . Marital Status: Married    Spouse Name: N/A    Number of Children: N/A  . Years of Education: N/A   Occupational History  . Retired    Social History Main Topics  . Smoking status: Current Every Day Smoker -- 1.00 packs/day    Types: Cigarettes  . Smokeless tobacco: Never Used  . Alcohol Use: No     Comment: not in 6 yr  . Drug Use: No  . Sexual Activity: Not on file   Other Topics Concern  . Not on file   Social History Narrative   Lives with wife.    Family Status  Relation Status Death Age  . Mother Deceased   . Father Deceased    Family History  Problem Relation Age of Onset  . Diabetes    . Cancer       ROS:  Full 14 point review of systems complete and found to be negative unless listed above.  Physical Exam: Blood pressure 108/63, pulse 56, temperature 98 F (36.7 C), temperature source Oral, resp. rate 19, weight 160 lb (72.576 kg), SpO2 100.00%.  General: Well developed, well  nourished, male , weak but not in acute pain. Head: Eyes PERRLA, No xanthomas. Scleara are pale.  Normocephalic and atraumatic, oropharynx without edema or exudate. Dentition: poor Lungs: crackles bases Heart: HRRR S1 S2, no rub/gallop, no murmur. pulses are 2+ all 4 extrem.   Neck: No carotid bruits. No lymphadenopathy.  JVP is elevated. Abdomen: Bowel sounds present, abdomen soft and non-tender without masses or hernias noted. Msk:  No spine or cva tenderness. No weakness, no joint deformities or effusions. Extremities: No clubbing or cyanosis. no edema. Right wrist is splinted, not unwrapped.  Neuro: Alert and oriented X 3. No focal deficits noted. Psych:  Good affect, responds appropriately Skin: No rashes or lesions noted.  Labs: Other labs are pending   Lab Results  Component Value Date   WBC 9.9 03/22/2014   HGB 10.2* 03/22/2014   HCT 32.0* 03/22/2014   MCV 78.6 03/22/2014   PLT 329 03/22/2014   No results found for this basename: INR,  in the last 72 hours No results found for this basename: NA, K, CL, CO2, BUN, CREATININE, CALCIUM, LABALBU, PROT, BILITOT, ALKPHOS, ALT, AST, GLUCOSE, ALBUMIN,  in the last 168 hours  Recent Labs  03/22/14 1623  TROPIPOC 0.00    Echo: 02/21/2010 Study Conclusions - Left ventricle: The cavity size was normal. Wall thickness was normal. Systolic function was normal. The estimated ejection fraction was in the range of 55% to 60%. - Mitral valve: Bileaflet prolapse worse in the posterior leaftlet with mild-moderate anteriorly directed MR Mild to moderate regurgitation. - Left atrium: The atrium was moderately dilated. - Atrial septum: No defect or patent foramen ovale was identified.  ECG:  03/22/2014 - LVH and non-specific ST/T changes - do not appear significantly different from 02/03/2013 ECG. Vent. rate 54 BPM PR interval 164 ms QRS duration 99 ms QT/QTc 519/492 ms P-R-T axes 62 73 71  Radiology:  No results found.  ASSESSMENT AND PLAN:    The patient was seen today by Dr. Tamala Julian, the patient evaluated and the data reviewed.   Possible CGS/CAD - Pt without clearly ischemic symptoms and initial ez negative. ECG does not appear to have significant changes from 1 year ago and bradycardia is long-standing. Will continue to follow ez and ck echo, but currently do not think primary cardiac event.   Active Problems:   DIABETES MELLITUS, TYPE II, CONTROLLED   Hypotension   Signed: Rosaria Ferries, PA-C 03/22/2014 4:44 PM Beeper [phone]  Co-Sign MD

## 2014-03-22 NOTE — ED Provider Notes (Signed)
CSN: [id number]     Arrival date & time 03/22/14  1539 History   First MD Initiated Contact with Patient 03/22/14 1618     Chief Complaint  Patient presents with  . Chest Pain  . Seizures  . Hypotension     (Consider location/radiation/quality/duration/timing/severity/associated sxs/prior Treatment) HPI Comments: EMS called out for witness seizure consisted of "head bobbing." Lasted 30 seconds, return to baseline. States had some chest pain but has resolved. Now with no complaints. Felt well before seizure, had no aura. States has some HA now after seizure. Denies numbnwess, weakness, SOB, abd pain, n/v/d.  Patient is a 73 y.o. male presenting with seizures. The history is provided by the EMS personnel and the patient.  Seizures Seizure activity on arrival: no   Seizure type: "head bobbing" Preceding symptoms: no dizziness, no headache, no hyperventilation and no nausea   Initial focality:  None Episode characteristics: abnormal movements and partial responsiveness   Episode characteristics: no combativeness, no confusion, no disorientation, no eye deviation, no focal shaking, no generalized shaking, no incontinence, no stiffening, no tongue biting and responsive   Postictal symptoms comment:  HA Return to baseline: yes   Severity:  Mild Timing:  Once Number of seizures this episode:  1 Progression:  Resolved Context: medical non-compliance   Context: not alcohol withdrawal, not change in medication, not sleeping less, not drug use, not flashing visual stimuli, not hydrocephalus, not intracranial lesion, not possible hypoglycemia, not possible medication ingestion and not stress   Recent head injury:  No recent head injuries PTA treatment:  None History of seizures: yes     Past Medical History  Diagnosis Date  . Scabies   . DIABETES MELLITUS, TYPE II, CONTROLLED   . HYPERCHOLESTEROLEMIA, MIXED   . ANEMIA, IRON DEFICIENCY NOS   . ERECTILE DYSFUNCTION   . CATARACTS, BILATERAL    . HYPERTENSION   . COPD   . G E R D   . DIVERTICULOSIS, COLON   . MILD SPINAL STENOSIS, CERVICAL   . DYSPHAGIA UNSPECIFIED   . SEIZURE, GRAND MAL   . BENIGN PROSTATIC HYPERTROPHY, WITH URINARY OBSTRUCTION   . HTN (hypertension)   . Mitral regurgitation   . Glaucoma   . Depression   . Seizures     cannot remember how long ago-several years  . GERD (gastroesophageal reflux disease)    Past Surgical History  Procedure Laterality Date  . Knee arthroscopy  2008    rt  . Intracervical discectomy with fusion  c4-5 and c5-6--dr. cram    . Capsulotomy Right 01/26/2013    Procedure: MINOR CAPSULOTOMY;  Surgeon: Myrtha Mantis., MD;  Location: [place];  Service: Ophthalmology;  Laterality: Right;  . Yag laser application Right 01/25/3663    Procedure: YAG LASER APPLICATION;  Surgeon: Myrtha Mantis., MD;  Location: [place];  Service: Ophthalmology;  Laterality: Right;  . Eye surgery      cataracts  . Shoulder arthroscopy  2012    left-bicept tendon repair-DSC  . Laryngoscopy with dilatation      2011-DSC  . I&d extremity Right 05/24/2013    Procedure: RIGHT INDEX WOUND DEBRIDEMENT AND CLOSURE;  Surgeon: Jolyn Nap, MD;  Location: [place];  Service: Orthopedics;  Laterality: Right;  . Foot surgery Left   . Carpectomy Right 03/13/2014    Procedure: RIGHT  PROXIMAL ROW CARPECTOMY;  Surgeon: Tennis Must, MD;  Location: [place];  Service: Orthopedics;  Laterality: Right;  .  Carpometacarpal (cmc) fusion of thumb Right 03/13/2014    Procedure: RIGHT FUSION OF THUMB CARPOMETACARPAL [facility]) JOINT;  Surgeon: Tennis Must, MD;  Location: [place];  Service: Orthopedics;  Laterality: Right;   Family History  Problem Relation Age of Onset  . Diabetes    . Cancer     History  Substance Use Topics  . Smoking status: Current Every Day Smoker -- 1.00 packs/day    Types: Cigarettes  . Smokeless tobacco: Never Used  . Alcohol Use:  No     Comment: not in 6 yr    Review of Systems  Neurological: Positive for seizures.      Allergies  Review of patient's allergies indicates no known allergies.  Home Medications   Current Outpatient Rx  Name  Route  Sig  Dispense  Refill  . acetaZOLAMIDE (DIAMOX) 500 MG capsule   Oral   Take 500 mg by mouth daily.          Marland Kitchen albuterol (PROVENTIL HFA;VENTOLIN HFA) 108 (90 BASE) MCG/ACT inhaler   Inhalation   Inhale 2 puffs into the lungs every 6 (six) hours as needed for wheezing or shortness of breath.         Marland Kitchen amitriptyline (ELAVIL) 10 MG tablet   Oral   Take 10 mg by mouth at bedtime.         . bimatoprost (LUMIGAN) 0.01 % SOLN   Both Eyes   Place 1 drop into both eyes at bedtime.   30 mL   3   . brimonidine-timolol (COMBIGAN) 0.2-0.5 % ophthalmic solution   Both Eyes   Place 1 drop into both eyes 2 (two) times daily.          . dorzolamide (TRUSOPT) 2 % ophthalmic solution   Left Eye   Place 1 drop into the left eye 3 (three) times daily.          . DULoxetine (CYMBALTA) 60 MG capsule   Oral   Take 60 mg by mouth 2 (two) times daily.         Marland Kitchen esomeprazole (NEXIUM) 40 MG capsule   Oral   Take 40 mg by mouth daily.         Marland Kitchen gabapentin (NEURONTIN) 600 MG tablet   Oral   Take 1 tablet (600 mg total) by mouth 2 (two) times daily.   60 tablet   0   . hydrochlorothiazide (HYDRODIURIL) 12.5 MG tablet   Oral   Take 12.5 mg by mouth daily.         Marland Kitchen losartan (COZAAR) 100 MG tablet   Oral   Take 1 tablet (100 mg total) by mouth daily.   30 tablet   3   . Multiple Vitamin (MULTIVITAMIN WITH MINERALS) TABS tablet   Oral   Take 1 tablet by mouth daily. Geritol         . oxybutynin (DITROPAN-XL) 10 MG 24 hr tablet   Oral   Take 1 tablet (10 mg total) by mouth at bedtime.   30 tablet   3   . oxyCODONE-acetaminophen (PERCOCET/ROXICET) 5-325 MG per tablet   Oral   Take 1 tablet by mouth every 6 (six) hours as needed (pain).          . pravastatin (PRAVACHOL) 80 MG tablet   Oral   Take 1 tablet (80 mg total) by mouth daily.   30 tablet   3   . tamsulosin (FLOMAX) 0.4 MG CAPS capsule  Oral   Take 0.4 mg by mouth daily.           BP 118/63  Pulse 57  Temp(Src) 98 F (36.7 C) (Oral)  Resp 15  Wt 160 lb (72.576 kg)  SpO2 100% Physical Exam  Vitals reviewed. Constitutional: He is oriented to person, place, and time. He appears well-developed and well-nourished. No distress.  Mild dehydrated  HENT:  Head: Normocephalic and atraumatic.  Mouth/Throat: Oropharynx is clear and moist. No oropharyngeal exudate.  Eyes: Conjunctivae and EOM are normal. Pupils are equal, round, and reactive to light. Right eye exhibits no discharge. Left eye exhibits no discharge. No scleral icterus.  Neck: Normal range of motion. Neck supple.  Cardiovascular: Normal rate, regular rhythm, normal heart sounds and intact distal pulses.  Exam reveals no gallop and no friction rub.   No murmur heard. Pulmonary/Chest: Effort normal and breath sounds normal. No respiratory distress. He has no wheezes. He has no rales.  Abdominal: Soft. He exhibits no distension and no mass. There is no tenderness.  Musculoskeletal: Normal range of motion.  Neurological: He is alert and oriented to person, place, and time. No cranial nerve deficit. He exhibits normal muscle tone. Coordination normal.  Skin: Skin is warm. No rash noted. He is not diaphoretic.    ED Course  Procedures (including critical care time) Labs Review Labs Reviewed  CBC WITH DIFFERENTIAL - Abnormal; Notable for the following:    RBC 4.07 (*)    Hemoglobin 10.2 (*)    HCT 32.0 (*)    MCH 25.1 (*)    RDW 17.4 (*)    All other components within normal limits  COMPREHENSIVE METABOLIC PANEL - Abnormal; Notable for the following:    Albumin 3.2 (*)    Total Bilirubin <0.2 (*)    GFR calc non Af Amer 59 (*)    GFR calc Af Amer 68 (*)    All other components within normal  limits  I-STAT CG4 LACTIC ACID, ED - Abnormal; Notable for the following:    Lactic Acid, Venous 3.27 (*)    All other components within normal limits  LIPASE, BLOOD  I-STAT TROPOININ, ED   Imaging Review Dg Chest Portable 1 View  03/22/2014   CLINICAL DATA:  Near syncope. Dizziness. Chest pain. High blood pressure. Smoker.  EXAM: PORTABLE CHEST - 1 VIEW  COMPARISON:  08/17/2013.  FINDINGS: Calcified mildly tortuous aorta.  Heart size within normal limits.  Minimal peribronchial thickening. No infiltrate, congestive heart failure or pneumothorax.  Right apical scarring without associated bony destruction.  No plain film evidence of pulmonary malignancy.  IMPRESSION: Minimal peribronchial thickening stable. No infiltrate, congestive heart failure or pneumothorax.  Calcified mildly tortuous aorta.   Electronically Signed   By: Chauncey Cruel M.D.   On: 03/22/2014 16:42     EKG Interpretation   Date/Time:  Thursday March 22 2014 15:44:59 EDT Ventricular Rate:  54 PR Interval:  164 QRS Duration: 99 QT Interval:  519 QTC Calculation: 492 R Axis:   73 Text Interpretation:  Sinus rhythm LVH with secondary repolarization  abnormality Borderline prolonged QT interval V 3, V 4 changes new since  previous  Confirmed by YAO  MD, DAVID ([id number]) on 03/22/2014 3:51:40 PM      MDM   MMD: 73 y.o. AAM w/ PMx of DM, HLD, HTN, COPD, Seizures w/ cc: of seizure. Pt was at home, outside, when suddenly nodded off per family and his "head was bobbing." EMS was called and  transferred. On arrival, initially lethargic, hypotensive 60/40. Given some fluid and BP came up to 80/60, pt alert and oriented. On arrival, pt with no current complaints. States he has some chest paine earlier but resolved. States has small HA. Rememebrs everything and states his last seizure was years ago. Denies abd pain, SOB. Denies drug use. He is afebrile, HR nroaml, sats normal. Exam unremarkable. EKG shows V3 biphasic T wave and V4 TWI  concerned for Wellens. Discussed with Cards who does not think STEMI. Labs obtained, pt given fluids, vitals normalized but still soft pressures (90s/60s). Pt remained asymptomatic. Initial labs as above, plan is to obtain CT scan for seizure, and continue to monitor and likely admit. D/w this with patient. Initially ok but later requesting discharge stating he had to go home. I evaluated patient before discharge. He was AOx3, and was not dysarthric or ataxia. Clinically sober. He wished to sign out AMA. I informed him about his hypotension he had on arrival and instructed I would like to obtain further tests and likely admit him to the hospital. He stated he felt fine and did not need to stay. I explained risks which included death, permanent disability to which he acknowledged and still requested discharge. Being that he understood the risks, was alert, clinically sober, he had capacity to make this decision and he signed out AMA and was discharged. Care of case d/w my attending.  Final diagnoses:  Hypotensive episode      AMA  Sol Passer, MD 03/22/14 1743

## 2014-03-22 NOTE — Consult Note (Addendum)
Asked to see patient who was initially felt to be in cardiogenic shock. We evaluated and he was pain free, low BP associated initially associated with nausea and had transient chest pain prior to getting to ER. No ECG changes to suggest STEMI. Overall, I believe he had a vagal type episode, causing low BP. No evidence for cardiogenic shock. Bradycardia has been longstanding. Would cycle cardiac markers and repeat ECG in AM. Call earlier if positive markers or recurrent CP. Not sure he will need ischemic cardiac workup.

## 2014-03-22 NOTE — ED Notes (Signed)
Pt states he is leaving and ripping off all the wires.  Dr Sunny Schlein notified and is speaking with the pt.

## 2014-03-22 NOTE — ED Notes (Signed)
Cardiology at bedside with MD Darl Householder evaluating patient, pt continued to deny chest pain at this time. Cardiology states to call them upon further workup.

## 2014-03-26 NOTE — ED Provider Notes (Signed)
I saw and evaluated the patient, reviewed the resident's note and I agree with the findings and plan.   EKG Interpretation   Date/Time:  Thursday March 22 2014 15:44:59 EDT Ventricular Rate:  54 PR Interval:  164 QRS Duration: 99 QT Interval:  519 QTC Calculation: 492 R Axis:   73 Text Interpretation:  Sinus rhythm LVH with secondary repolarization  abnormality Borderline prolonged QT interval V 3, V 4 changes new since  previous  Confirmed by Jamerson Vonbargen  MD, Hephzibah Strehle ([id number]) on 03/22/2014 3:51:40 PM      Russell [name] is a 73 y.o. male hx of DM, HL, HTn here with possible seizure vs syncope. He was at home and was noted to have head bobbing and AMS. Was hypotensive as per EMS. Also had some antecedant chest pain. EKG showed biphasic T was V 4 and TWI V4.I was concerned for Wellens. Cardiology evaluated and felt it was not STEMI. Givne IVF and BP improved. Will like to admit for workup but patient refused after he felt better. Understand risks including disability and death and signed out AMA.   Level V caveat- AMS     Wandra Arthurs, MD 03/26/14 [phone]

## 2014-03-29 ENCOUNTER — Other Ambulatory Visit: Payer: Self-pay | Admitting: Internal Medicine

## 2014-04-10 ENCOUNTER — Ambulatory Visit (INDEPENDENT_AMBULATORY_CARE_PROVIDER_SITE_OTHER): Payer: Medicare Other | Admitting: Podiatry

## 2014-04-10 ENCOUNTER — Encounter: Payer: Self-pay | Admitting: Podiatry

## 2014-04-10 VITALS — BP 129/82 | HR 64 | Resp 18 | Ht 74.0 in | Wt 169.0 lb

## 2014-04-10 DIAGNOSIS — G5790 Unspecified mononeuropathy of unspecified lower limb: Secondary | ICD-10-CM

## 2014-04-10 DIAGNOSIS — G5792 Unspecified mononeuropathy of left lower limb: Secondary | ICD-10-CM

## 2014-04-10 NOTE — Progress Notes (Signed)
He presents today for chief complaint of pain to the dorsal aspect of toes #3 and 4 of the left foot. We have previously removed the neuroma to the third interdigital space of the left foot. The dorsal aspect of the toes are hurting at this point. We have also removed all internal fixation to the toes.  Objective: Vital signs are stable he is alert and oriented x3. Pulses remain palpable left foot. Neurologic sensorium is hyperintense on palpation of the fourth and third digits of the left foot.  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  Plan: Injected dehydrated alcohol to the dorsal aspect of the left foot which rendered him asymptomatic due to the local anesthetic. This should correlate very nicely with nerve destruction.

## 2014-04-20 ENCOUNTER — Telehealth: Payer: Self-pay | Admitting: *Deleted

## 2014-04-20 NOTE — Telephone Encounter (Signed)
He needs to do something about this toe.  Cut it off or something!  I returned his call.  He stated this toe is killing me!  That shot only lasted a day.  I asked him if he's been soaking it.  He stated he's been soaking it in [facility].  I asked if he wanted to see Dr. Milinda Pointer sooner than 05/03/2014.  The scheduler offered him Tuesday of next week.  He said he couldn't come in then.  I spoke to him again and he was asking for pain medication.  I explained to him that Dr. Milinda Pointer is not in the office today to okay the pain medication.  I advised him to try Advil or Aleve if he can tolerate it.  He stated he's already tried that.  I reiterated that we cannot prescribe any pain medication without Dr. Stephenie Acres approval.  He stated Alright!

## 2014-05-03 ENCOUNTER — Ambulatory Visit (INDEPENDENT_AMBULATORY_CARE_PROVIDER_SITE_OTHER): Payer: Medicare Other | Admitting: Podiatry

## 2014-05-03 ENCOUNTER — Encounter: Payer: Self-pay | Admitting: Podiatry

## 2014-05-03 VITALS — BP 138/81 | HR 72 | Resp 16

## 2014-05-03 DIAGNOSIS — D3610 Benign neoplasm of peripheral nerves and autonomic nervous system, unspecified: Secondary | ICD-10-CM

## 2014-05-03 DIAGNOSIS — G5790 Unspecified mononeuropathy of unspecified lower limb: Secondary | ICD-10-CM

## 2014-05-03 DIAGNOSIS — G5792 Unspecified mononeuropathy of left lower limb: Secondary | ICD-10-CM

## 2014-05-03 DIAGNOSIS — D219 Benign neoplasm of connective and other soft tissue, unspecified: Secondary | ICD-10-CM

## 2014-05-03 NOTE — Progress Notes (Signed)
He presents today for followup of his neuritis to his fourth digit of his left foot. He states that this is still very tender. He states that he was better for a while and then got worse again.  Objective: Vital signs are stable he is alert and oriented x3. Still has tenderness on palpation the dorsal aspect of the third and fourth digits of the left foot.  Assessment: Neuritis third interdigital space left foot.  Plan: Injection with dehydrated alcohol to the dorsal aspect of the foot along the intermediate dorsal cutaneous nerve. This was the second dose and I will followup with him for his third dose in 3-4 weeks.

## 2014-06-06 ENCOUNTER — Other Ambulatory Visit (HOSPITAL_COMMUNITY): Payer: Self-pay | Admitting: Orthopedic Surgery

## 2014-06-06 DIAGNOSIS — M5410 Radiculopathy, site unspecified: Secondary | ICD-10-CM

## 2014-06-15 ENCOUNTER — Ambulatory Visit (HOSPITAL_COMMUNITY)
Admission: RE | Admit: 2014-06-15 | Discharge: 2014-06-15 | Disposition: A | Discharge status: Home / Self Care | Payer: PRIVATE HEALTH INSURANCE | Source: Ambulatory Visit | Attending: Orthopedic Surgery | Admitting: Orthopedic Surgery

## 2014-06-15 DIAGNOSIS — M5126 Other intervertebral disc displacement, lumbar region: Secondary | ICD-10-CM | POA: Insufficient documentation

## 2014-06-15 DIAGNOSIS — M48061 Spinal stenosis, lumbar region without neurogenic claudication: Secondary | ICD-10-CM | POA: Insufficient documentation

## 2014-06-15 DIAGNOSIS — M545 Low back pain, unspecified: Secondary | ICD-10-CM | POA: Diagnosis present

## 2014-06-15 DIAGNOSIS — M5410 Radiculopathy, site unspecified: Secondary | ICD-10-CM

## 2014-06-21 ENCOUNTER — Other Ambulatory Visit (HOSPITAL_COMMUNITY): Payer: Self-pay | Admitting: *Deleted

## 2014-06-21 ENCOUNTER — Other Ambulatory Visit (HOSPITAL_COMMUNITY): Payer: Self-pay | Admitting: Orthopedic Surgery

## 2014-06-21 DIAGNOSIS — M5412 Radiculopathy, cervical region: Secondary | ICD-10-CM

## 2014-06-21 DIAGNOSIS — R52 Pain, unspecified: Secondary | ICD-10-CM

## 2014-07-11 ENCOUNTER — Ambulatory Visit (HOSPITAL_COMMUNITY): Admission: RE | Admit: 2014-07-11 | Payer: PRIVATE HEALTH INSURANCE | Source: Ambulatory Visit

## 2014-07-11 ENCOUNTER — Ambulatory Visit (HOSPITAL_COMMUNITY): Payer: PRIVATE HEALTH INSURANCE

## 2014-09-04 ENCOUNTER — Telehealth: Payer: Self-pay | Admitting: *Deleted

## 2014-09-04 NOTE — Telephone Encounter (Signed)
I'm calling Dr. Milinda Pointer.  I want him to give me a call as soon as possible.  Thank you.  I returned his call.  I have an appointment at the pain center.Dr. Milinda Pointer wanted to let him know when I got an appointment and he would call and let them know about the pain I've been having in my toe.  The number at the place is [phone].  I asked the name of the doctor.  He stated, "Ma'am I don't know.  I asked the name of the pain center.  He stated,  "[facility]."  I told him I will let Dr. Milinda Pointer know.  He said, "Okay, thank you."

## 2014-09-04 NOTE — Telephone Encounter (Signed)
Call and let the pain clinic know about his painful toe to the left 3rd and 4th toes.  Chronic nerve pain.  You can send notes as well if you desire

## 2014-09-05 NOTE — Telephone Encounter (Signed)
I attempted to call and give them information regarding the patient upon his request.  However, this was a fax number.  Per Dr. Milinda Pointer, I faxed them the chart notes for the last 3 visits.

## 2014-10-10 ENCOUNTER — Telehealth: Payer: Self-pay | Admitting: *Deleted

## 2014-10-10 NOTE — Telephone Encounter (Signed)
Dr. Milinda Pointer would you mind getting in touch with me.  Get in touch with me as soon as possible, thank you.  I attempted to return his call, I left a message to call me tomorrow.

## 2014-11-20 ENCOUNTER — Other Ambulatory Visit (HOSPITAL_COMMUNITY): Payer: Self-pay | Admitting: Urology

## 2014-11-20 DIAGNOSIS — C61 Malignant neoplasm of prostate: Secondary | ICD-10-CM

## 2014-11-28 ENCOUNTER — Ambulatory Visit (HOSPITAL_COMMUNITY)
Admission: RE | Admit: 2014-11-28 | Discharge: 2014-11-28 | Disposition: A | Discharge status: Home / Self Care | Payer: Medicare Other | Source: Ambulatory Visit | Attending: Urology | Admitting: Urology

## 2014-11-28 DIAGNOSIS — C61 Malignant neoplasm of prostate: Secondary | ICD-10-CM

## 2014-11-28 MED ORDER — FLUDEOXYGLUCOSE F - 18 (FDG) INJECTION
10.2000 | Freq: Once | INTRAVENOUS | Status: AC | PRN
  Administered 2014-11-28: 10.2 via INTRAVENOUS

## 2014-12-19 ENCOUNTER — Telehealth: Payer: Self-pay

## 2014-12-19 ENCOUNTER — Emergency Department (HOSPITAL_COMMUNITY): Payer: Medicare Other

## 2014-12-19 ENCOUNTER — Emergency Department (HOSPITAL_COMMUNITY)
Admission: EM | Admit: 2014-12-19 | Discharge: 2014-12-20 | Disposition: A | Discharge status: Home / Self Care | Payer: Medicare Other | Attending: Emergency Medicine | Admitting: Emergency Medicine

## 2014-12-19 ENCOUNTER — Encounter (HOSPITAL_COMMUNITY): Payer: Self-pay | Admitting: Emergency Medicine

## 2014-12-19 DIAGNOSIS — M79602 Pain in left arm: Secondary | ICD-10-CM | POA: Diagnosis not present

## 2014-12-19 DIAGNOSIS — J449 Chronic obstructive pulmonary disease, unspecified: Secondary | ICD-10-CM | POA: Diagnosis not present

## 2014-12-19 DIAGNOSIS — Z79899 Other long term (current) drug therapy: Secondary | ICD-10-CM | POA: Diagnosis not present

## 2014-12-19 DIAGNOSIS — Z862 Personal history of diseases of the blood and blood-forming organs and certain disorders involving the immune mechanism: Secondary | ICD-10-CM | POA: Insufficient documentation

## 2014-12-19 DIAGNOSIS — Z720 Tobacco use: Secondary | ICD-10-CM | POA: Diagnosis not present

## 2014-12-19 DIAGNOSIS — E782 Mixed hyperlipidemia: Secondary | ICD-10-CM | POA: Diagnosis not present

## 2014-12-19 DIAGNOSIS — F329 Major depressive disorder, single episode, unspecified: Secondary | ICD-10-CM | POA: Insufficient documentation

## 2014-12-19 DIAGNOSIS — T1490XA Injury, unspecified, initial encounter: Secondary | ICD-10-CM

## 2014-12-19 DIAGNOSIS — I10 Essential (primary) hypertension: Secondary | ICD-10-CM | POA: Diagnosis not present

## 2014-12-19 DIAGNOSIS — Z86010 Personal history of colonic polyps: Secondary | ICD-10-CM | POA: Diagnosis not present

## 2014-12-19 DIAGNOSIS — R52 Pain, unspecified: Secondary | ICD-10-CM

## 2014-12-19 DIAGNOSIS — E119 Type 2 diabetes mellitus without complications: Secondary | ICD-10-CM | POA: Insufficient documentation

## 2014-12-19 DIAGNOSIS — K219 Gastro-esophageal reflux disease without esophagitis: Secondary | ICD-10-CM | POA: Diagnosis not present

## 2014-12-19 DIAGNOSIS — R079 Chest pain, unspecified: Secondary | ICD-10-CM | POA: Diagnosis present

## 2014-12-19 DIAGNOSIS — W19XXXA Unspecified fall, initial encounter: Secondary | ICD-10-CM

## 2014-12-19 DIAGNOSIS — R0789 Other chest pain: Secondary | ICD-10-CM | POA: Insufficient documentation

## 2014-12-19 MED ORDER — HYDROCODONE-ACETAMINOPHEN 5-325 MG PO TABS
1.0000 | ORAL_TABLET | Freq: Once | ORAL | Status: AC
  Administered 2014-12-19: 1 via ORAL
  Filled 2014-12-19: qty 1

## 2014-12-19 NOTE — Telephone Encounter (Signed)
Left message for patient to call office.  

## 2014-12-19 NOTE — ED Provider Notes (Signed)
CSN: [id number]     Arrival date & time 12/19/14  2212 History  This chart was scribed for  by Rayfield Citizen, ED Scribe. This patient was seen in room D32C/D32C and the patient's care was started at 11:14 PM.    Chief Complaint  Patient presents with  . Rib Injury   Patient is a 73 y.o. male presenting with chest pain. The history is provided by the patient. No language interpreter was used.  Chest Pain Pain location:  L chest Pain radiates to:  Does not radiate Pain radiates to the back: no   Pain severity:  Moderate Onset quality:  Sudden Duration:  12 hours Timing:  Constant Progression:  Unchanged Chronicity:  New Relieved by:  None tried Worsened by:  Nothing tried Ineffective treatments:  None tried Associated symptoms: no abdominal pain, no back pain, no cough, no fever, no headache, no nausea, no shortness of breath and not vomiting      HPI Comments: Russell [name] is a 73 y.o. male who presents to the Emergency Department complaining of fall around 11:00 this morning; patient explains that he fell down several steps. He denies LOC. He currently complains of right-sided chest pain - he specifies his ribs. He also notes mid left arm pain. He rates his pain as a 9/10. He denies SOB, abdominal pain or other injury.   PCP is Marlou Sa, ERIC, MD at [facility].   Past Medical History  Diagnosis Date  . Scabies   . DIABETES MELLITUS, TYPE II, CONTROLLED   . HYPERCHOLESTEROLEMIA, MIXED   . ANEMIA, IRON DEFICIENCY NOS   . ERECTILE DYSFUNCTION   . CATARACTS, BILATERAL   . HYPERTENSION   . COPD   . G E R D   . DIVERTICULOSIS, COLON   . MILD SPINAL STENOSIS, CERVICAL   . DYSPHAGIA UNSPECIFIED   . SEIZURE, GRAND MAL   . BENIGN PROSTATIC HYPERTROPHY, WITH URINARY OBSTRUCTION   . HTN (hypertension)   . Mitral regurgitation   . Glaucoma   . Depression   . Seizures     cannot remember how long ago-several years  . GERD (gastroesophageal reflux disease)    Past Surgical  History  Procedure Laterality Date  . Knee arthroscopy  2008    rt  . Intracervical discectomy with fusion  c4-5 and c5-6--dr. cram    . Capsulotomy Right 01/26/2013    Procedure: MINOR CAPSULOTOMY;  Surgeon: Myrtha Mantis., MD;  Location: [place];  Service: Ophthalmology;  Laterality: Right;  . Yag laser application Right 6/0/7371    Procedure: YAG LASER APPLICATION;  Surgeon: Myrtha Mantis., MD;  Location: [place];  Service: Ophthalmology;  Laterality: Right;  . Eye surgery      cataracts  . Shoulder arthroscopy  2012    left-bicept tendon repair-DSC  . Laryngoscopy with dilatation      2011-DSC  . I&d extremity Right 05/24/2013    Procedure: RIGHT INDEX WOUND DEBRIDEMENT AND CLOSURE;  Surgeon: Jolyn Nap, MD;  Location: [place];  Service: Orthopedics;  Laterality: Right;  . Foot surgery Left   . Carpectomy Right 03/13/2014    Procedure: RIGHT  PROXIMAL ROW CARPECTOMY;  Surgeon: Tennis Must, MD;  Location: [place];  Service: Orthopedics;  Laterality: Right;  . Carpometacarpal (cmc) fusion of thumb Right 03/13/2014    Procedure: RIGHT FUSION OF THUMB CARPOMETACARPAL [facility]) JOINT;  Surgeon: Tennis Must, MD;  Location: [place];  Service: Orthopedics;  Laterality: Right;   Family History  Problem Relation Age of Onset  . Diabetes    . Cancer     History  Substance Use Topics  . Smoking status: Current Every Day Smoker -- 1.00 packs/day    Types: Cigarettes  . Smokeless tobacco: Never Used  . Alcohol Use: No     Comment: not in 6 yr    Review of Systems  Constitutional: Negative for fever and chills.  HENT: Negative for rhinorrhea and sore throat.   Eyes: Negative for visual disturbance.  Respiratory: Negative for cough and shortness of breath.   Cardiovascular: Positive for chest pain. Negative for leg swelling.  Gastrointestinal: Negative for nausea, vomiting, abdominal pain and diarrhea.  Genitourinary:  Negative for dysuria, frequency and hematuria.  Musculoskeletal: Negative for back pain and neck pain.  Skin: Negative for rash.  Neurological: Negative for headaches.  Hematological: Does not bruise/bleed easily.  Psychiatric/Behavioral: Negative for confusion.    Allergies  Review of patient's allergies indicates no known allergies.  Home Medications   Prior to Admission medications   Medication Sig Start Date End Date Taking? Authorizing Provider  acetaZOLAMIDE (DIAMOX) 500 MG capsule Take 500 mg by mouth daily.  11/29/13   Historical Provider, MD  albuterol (PROVENTIL HFA;VENTOLIN HFA) 108 (90 BASE) MCG/ACT inhaler Inhale 2 puffs into the lungs every 6 (six) hours as needed for wheezing or shortness of breath. 04/10/13   Nishant Dhungel, MD  amitriptyline (ELAVIL) 10 MG tablet Take 10 mg by mouth at bedtime.    Historical Provider, MD  bimatoprost (LUMIGAN) 0.01 % SOLN Place 1 drop into both eyes at bedtime. 04/10/13   Nishant Dhungel, MD  brimonidine-timolol (COMBIGAN) 0.2-0.5 % ophthalmic solution Place 1 drop into both eyes 2 (two) times daily.     Historical Provider, MD  dorzolamide (TRUSOPT) 2 % ophthalmic solution Place 1 drop into the left eye 3 (three) times daily.  11/09/13   Historical Provider, MD  DULoxetine (CYMBALTA) 60 MG capsule Take 60 mg by mouth 2 (two) times daily.    Historical Provider, MD  esomeprazole (NEXIUM) 40 MG capsule Take 40 mg by mouth daily.    Historical Provider, MD  gabapentin (NEURONTIN) 600 MG tablet Take 1 tablet (600 mg total) by mouth 2 (two) times daily. 04/10/13   Nishant Dhungel, MD  hydrochlorothiazide (HYDRODIURIL) 12.5 MG tablet Take 12.5 mg by mouth daily.    Historical Provider, MD  losartan (COZAAR) 100 MG tablet Take 1 tablet (100 mg total) by mouth daily. 04/10/13   Nishant Dhungel, MD  Multiple Vitamin (MULTIVITAMIN WITH MINERALS) TABS tablet Take 1 tablet by mouth daily. Geritol    Historical Provider, MD  oxybutynin (DITROPAN-XL) 10  MG 24 hr tablet Take 1 tablet (10 mg total) by mouth at bedtime. 04/10/13   Nishant Dhungel, MD  oxyCODONE-acetaminophen (PERCOCET/ROXICET) 5-325 MG per tablet Take 1 tablet by mouth every 6 (six) hours as needed (pain).    Historical Provider, MD  pravastatin (PRAVACHOL) 80 MG tablet Take 1 tablet (80 mg total) by mouth daily. 04/10/13   Nishant Dhungel, MD  tamsulosin (FLOMAX) 0.4 MG CAPS capsule Take 0.4 mg by mouth daily.  11/19/13   Historical Provider, MD   BP 160/74 mmHg  Pulse 64  Temp(Src) 98.4 F (36.9 C) (Oral)  Resp 18  SpO2 100% Physical Exam  Constitutional: He is oriented to person, place, and time. He appears well-developed and well-nourished.  HENT:  Head: Normocephalic and atraumatic.  Mouth/Throat:  Oropharynx is clear and moist. No oropharyngeal exudate.  Eyes: Conjunctivae and EOM are normal. Pupils are equal, round, and reactive to light.  Cardiovascular: Normal rate, regular rhythm and normal heart sounds.  Exam reveals no gallop and no friction rub.   No murmur heard. Left arm rad pulse 2+  Pulmonary/Chest: Effort normal and breath sounds normal. No respiratory distress. He has no wheezes. He has no rales. He exhibits tenderness.  Tender along rib margins of right anterior chest  Abdominal: Soft. Bowel sounds are normal. There is no tenderness. There is no rebound and no guarding.  Musculoskeletal: Normal range of motion. He exhibits no edema.  Tender mid humerus; no deformity. Wrist and elbow moves well.   Neurological: He is alert and oriented to person, place, and time.  Skin: Skin is warm and dry. No rash noted.  Psychiatric: He has a normal mood and affect. His behavior is normal.  Nursing note and vitals reviewed.   ED Course  Procedures   DIAGNOSTIC STUDIES: Oxygen Saturation is 100% on RA, normal by my interpretation.    COORDINATION OF CARE: 11:21 PM Discussed treatment plan with pt at bedside and pt agreed to plan.  Results for orders placed or  performed during the hospital encounter of 03/22/14  CBC with Differential  Result Value Ref Range   WBC 9.9 4.0 - 10.5 K/uL   RBC 4.07 (L) 4.22 - 5.81 MIL/uL   Hemoglobin 10.2 (L) 13.0 - 17.0 g/dL   HCT 32.0 (L) 39.0 - 52.0 %   MCV 78.6 78.0 - 100.0 fL   MCH 25.1 (L) 26.0 - 34.0 pg   MCHC 31.9 30.0 - 36.0 g/dL   RDW 17.4 (H) 11.5 - 15.5 %   Platelets 329 150 - 400 K/uL   Neutrophils Relative % 56 43 - 77 %   Neutro Abs 5.5 1.7 - 7.7 K/uL   Lymphocytes Relative 32 12 - 46 %   Lymphs Abs 3.2 0.7 - 4.0 K/uL   Monocytes Relative 8 3 - 12 %   Monocytes Absolute 0.8 0.1 - 1.0 K/uL   Eosinophils Relative 3 0 - 5 %   Eosinophils Absolute 0.3 0.0 - 0.7 K/uL   Basophils Relative 1 0 - 1 %   Basophils Absolute 0.1 0.0 - 0.1 K/uL  Comprehensive metabolic panel  Result Value Ref Range   Sodium 139 137 - 147 mEq/L   Potassium 3.9 3.7 - 5.3 mEq/L   Chloride 103 96 - 112 mEq/L   CO2 22 19 - 32 mEq/L   Glucose, Bld 91 70 - 99 mg/dL   BUN 11 6 - 23 mg/dL   Creatinine, Ser 1.19 0.50 - 1.35 mg/dL   Calcium 8.6 8.4 - 10.5 mg/dL   Total Protein 6.6 6.0 - 8.3 g/dL   Albumin 3.2 (L) 3.5 - 5.2 g/dL   AST 11 0 - 37 U/L   ALT 7 0 - 53 U/L   Alkaline Phosphatase 62 39 - 117 U/L   Total Bilirubin <0.2 (L) 0.3 - 1.2 mg/dL   GFR calc non Af Amer 59 (L) >90 mL/min   GFR calc Af Amer 68 (L) >90 mL/min  Lipase, blood  Result Value Ref Range   Lipase 21 11 - 59 U/L  I-stat troponin, ED  Result Value Ref Range   Troponin i, poc 0.00 0.00 - 0.08 ng/mL   Comment 3          I-Stat CG4 Lactic Acid, ED (do not order  at [facility])  Result Value Ref Range   Lactic Acid, Venous 3.27 (H) 0.5 - 2.2 mmol/L   Dg Ribs Unilateral W/chest Right  12/19/2014   CLINICAL DATA:  RIGHT lower anterior rib pain after falling onto concrete steps yesterday. History of prostate cancer.  EXAM: RIGHT RIBS AND CHEST - 3+ VIEW  COMPARISON:  PET-CT November 28, 2014  FINDINGS: The cardiac silhouette is mildly enlarged. Tortuous mildly  calcified aorta. No pleural effusions or focal consolidations. No pneumothorax.  ACDF.  No rib fracture deformity.  IMPRESSION: Mild cardiomegaly, no acute pulmonary process.  No rib fracture deformity.   Electronically Signed   By: Elon Alas   On: 12/19/2014 22:43   Nm Pet Mets (nopr) Whole Body (naf)  11/28/2014   CLINICAL DATA:  Prostate cancer.  PSA equal 8.4  EXAM: NM PET BONE METS (NOPR) WHOLE BODY (NaF)  TECHNIQUE: 10.9 mCi F-18 FDG was injected intravenously. Full-ring PET imaging was performed from the vertex to the feet after the radiotracer. CT data was obtained and used for attenuation correction and anatomic localization.  COMPARISON:  None.  FINDINGS: Whole-body PET bone scan: Small nonspecific focus of increased radiotracer uptake is identified within the posterior right scapula. On the corresponding CT images there is no abnormality identified. Degenerative changes are noted in both AC joints. Increased radiotracer uptake localizing to the facet joints at L5-S1 bilaterally is noted. There is also increased uptake along the left side of the endplates at Q7-H4, likely degenerative. No abnormal focus of increased uptake highly specific for bone metastasis.  CT findings: No enlarged cervical lymph nodes identified. No enlarged mediastinal or hilar adenopathy. Calcified atherosclerotic disease involves the LAD, RCA coronary arteries in the thoracic aorta. Moderate changes of centrilobular and paraseptal emphysema noted. No suspicious pulmonary nodules identified. There is a fluid attenuating structure within the lateral aspect of right hepatic lobe measuring 1.3 cm. Calcified atherosclerotic disease involves the abdominal aorta. The aorta measure 2.1 cm in maximum AP dimension.  No enlarged abdominal or pelvic lymph nodes identified. No suspicious sclerotic bone lesions noted.  IMPRESSION: 1. No abnormal focus of increased uptake highly specific for bone metastasis. 2. Degenerative type changes  are noted within the shoulders and lower lumbar spine. 3. Small, nonspecific focus within the posterior aspect of the right scapula is noted which is of questionable clinical significance. If the patient is having pain referable to this area consider MRI of the right shoulder. 4. Atherosclerotic disease including multi vessel coronary artery calcification.   Electronically Signed   By: Kerby Moors M.D.   On: 11/28/2014 16:37    Medications - No data to display  MDM   Final diagnoses:  Fall  Injury  Pain   Patient status post fall earlier today. No loss of consciousness. Patient with complaint of pain to the right anterior part of the chest along the ribs. No abdominal pain. Also complaint of left arm pain mid humerus. X-rays of both areas without any obvious abnormalities. No evidence of a pneumothorax no obvious evidence of a rib fracture no humerus fracture. Patient has pain medications at home.  I personally performed the services described in this documentation, which was scribed in my presence. The recorded information has been reviewed and is accurate.       Fredia Sorrow, MD 12/20/14 [phone]

## 2014-12-19 NOTE — Telephone Encounter (Signed)
Spoke with patient who stated that his toe and foot were hurting, advised to make an appointment to be seen, transferred to have appt made.

## 2014-12-19 NOTE — ED Notes (Signed)
Pt. tripped and fell at home this morning hit his right side against the step , no LOC / ambulatory , respirations unlabored , reports pain at right anterior ribcage .

## 2014-12-20 NOTE — Discharge Instructions (Signed)
Chest x-ray and x-ray of ribs without evidence of any obvious injury. X-ray of your left arm without any bony injury. Take your pain medicine you have at home as needed. Return for any new or worse symptoms. If not improving over a couple weeks follow-up with your regular Dr.

## 2014-12-27 ENCOUNTER — Ambulatory Visit: Payer: Medicare Other | Admitting: Podiatry

## 2014-12-28 ENCOUNTER — Other Ambulatory Visit: Payer: Self-pay | Admitting: Urology

## 2015-01-03 ENCOUNTER — Ambulatory Visit (INDEPENDENT_AMBULATORY_CARE_PROVIDER_SITE_OTHER): Payer: Medicare Other

## 2015-01-03 ENCOUNTER — Ambulatory Visit (INDEPENDENT_AMBULATORY_CARE_PROVIDER_SITE_OTHER): Payer: Medicare Other | Admitting: Podiatry

## 2015-01-03 VITALS — BP 158/81 | HR 59 | Resp 16

## 2015-01-03 DIAGNOSIS — M792 Neuralgia and neuritis, unspecified: Secondary | ICD-10-CM

## 2015-01-03 DIAGNOSIS — G5762 Lesion of plantar nerve, left lower limb: Secondary | ICD-10-CM

## 2015-01-03 DIAGNOSIS — M79675 Pain in left toe(s): Secondary | ICD-10-CM

## 2015-01-04 NOTE — Progress Notes (Signed)
Daren presents today for a follow-up of his pain to his left foot. He states that the pain clinic says that they can no longer do anything for him. His pain persists overlying the fourth digit of the left foot. He denies any changes in his past medical history medications or allergies.  Objective: Pulses remain palpable left. He has pain on palpation medial and lateral dorsal cutaneous nerve. With palpation of this nerve he develops radiating pain proximally.  Assessment digital neuritis fourth toe left foot.  Plan: Injected across the dorsal aspect of the toe with dehydrated alcohol and local anesthetic today we will start this neural lysis regimen and I will follow-up with him in 3 weeks.

## 2015-01-09 ENCOUNTER — Encounter (HOSPITAL_BASED_OUTPATIENT_CLINIC_OR_DEPARTMENT_OTHER): Payer: Self-pay | Admitting: *Deleted

## 2015-01-11 ENCOUNTER — Encounter (HOSPITAL_BASED_OUTPATIENT_CLINIC_OR_DEPARTMENT_OTHER): Payer: Self-pay | Admitting: *Deleted

## 2015-01-11 NOTE — Progress Notes (Signed)
NPO AFTER MN. ARRIVE AT 0900. NEEDS ISTAT AND PSA.  CURRENT EKG IN CHART AND EPIC. WILL TAKE AM MEDS W/ SIPS OF WATER DOS.

## 2015-01-12 NOTE — Anesthesia Preprocedure Evaluation (Addendum)
Anesthesia Evaluation  Patient identified by MRN, date of birth, ID band Patient awake    Reviewed: Allergy & Precautions, NPO status , Patient's Chart, lab work & pertinent test results  History of Anesthesia Complications Negative for: history of anesthetic complications  Airway Mallampati: II  TM Distance: >3 FB Neck ROM: Full    Dental no notable dental hx. (+) Dental Advisory Given, Lower Dentures, Upper Dentures   Pulmonary pneumonia -, resolved, COPD COPD inhaler, Current Smoker,  breath sounds clear to auscultation  Pulmonary exam normal       Cardiovascular hypertension, Pt. on medications + Valvular Problems/Murmurs MR Rhythm:Regular Rate:Normal     Neuro/Psych PSYCHIATRIC DISORDERS Anxiety Depression negative neurological ROS     GI/Hepatic Neg liver ROS, GERD-  Medicated and Controlled,  Endo/Other  diabetes, Type 2  Renal/GU negative Renal ROS  negative genitourinary   Musculoskeletal  (+) Arthritis -, Osteoarthritis,    Abdominal   Peds negative pediatric ROS (+)  Hematology negative hematology ROS (+)   Anesthesia Other Findings   Reproductive/Obstetrics negative OB ROS                            Anesthesia Physical Anesthesia Plan  ASA: III  Anesthesia Plan: General   Post-op Pain Management:    Induction: Intravenous  Airway Management Planned: LMA  Additional Equipment:   Intra-op Plan:   Post-operative Plan: Extubation in OR  Informed Consent: I have reviewed the patients History and Physical, chart, labs and discussed the procedure including the risks, benefits and alternatives for the proposed anesthesia with the patient or authorized representative who has indicated his/her understanding and acceptance.   Dental advisory given  Plan Discussed with: CRNA  Anesthesia Plan Comments:         Anesthesia Quick Evaluation

## 2015-01-13 NOTE — H&P (Signed)
Reason For Visit Vantas placement & review PET scan   Active Problems Problems  1. Prostate cancer (C61)   Assessed By: Carolan Clines (Urology); Last Assessed: 11 Dec 2014  History of Present Illness    74 year old [name] returns today for Vantas placement & to review PET scan results for hx prostate cancer. S/p prostate biopsy on 10/22/14. Hx of BPH, with IPSS=20 ( N < 10), and dysplasia of the prostate & elevated PSA. He is s/p a prostate biopsy on 09/15/13 with pathology showing HGPIN in [place]. Originally referred back by Dr. Marlou Sa for further evaluation of an elevated PSA of 16.24 on 07/12/13. He has some frequency & has been seen in the past for urinary retention. IPSS=24., repeat 20 He has lost 5 lbs over 3 months.      Pt was seen in follow-up of ER visit in 2011 for AUR, and placed on tamsulosin with initiation of voiding, and pvr=51cc. He then did not follow-up, and did not respond to follow-up letter. He stopped his tamsulosin because he was voiding well. Over the last month, however, he has noticed urinary frequency, and IPSS= 29     09/14/14 PSA - 21.78   Past Medical History Problems  1. History of Anxiety (F41.9) 2. History of Asthma (J45.909) 3. History of depression (Z86.59) 4. History of glaucoma (Z86.69) 5. History of hyperlipidemia (Z86.39) 6. History of hypertension (Z86.79)  Surgical History Problems  1. History of Back Surgery 2. History of Knee Surgery 3. History of Throat Surgery  Current Meds 1. Amitriptyline HCl - 10 MG Oral Tablet;  Therapy: (Recorded:25Aug2014) to Recorded 2. Combigan 0.2-0.5 % Ophthalmic Solution;  Therapy: (Recorded:25Aug2014) to Recorded 3. DULoxetine HCl - 60 MG Oral Capsule Delayed Release Particles;  Therapy: (Recorded:26Aug2014) to Recorded 4. Gabapentin 600 MG Oral Tablet;  Therapy: [id number] to Recorded 5. Hydrocodone-Acetaminophen 10-325 MG Oral Tablet;  Therapy: [id number] to Recorded 6. Levofloxacin 500 MG  Oral Tablet; 1 tablet the day before the procedure, 1  tablet the day of the procedure, and 1 tablet the day after the procedure;  Therapy: 02Oct2015 to (Last Rx:02Oct2015) Ordered 7. Losartan Potassium 100 MG Oral Tablet;  Therapy: (Recorded:30Jun2011) to Recorded 8. Lumigan 0.01 % Ophthalmic Solution;  Therapy: (Recorded:26Aug2014) to Recorded 9. Meloxicam 7.5 MG Oral Tablet;  Therapy: (Recorded:26Aug2014) to Recorded 10. NexIUM 40 MG Oral Packet;   Therapy: (Recorded:26Aug2014) to Recorded 11. Tamsulosin HCl - 0.4 MG Oral Capsule; TAKE ONE CAPSULE BY   MOUTH EVERY NIGHT AT BEDTIME;   Therapy: 26Aug2014 to (Evaluate:29Sep2016)  Requested for: 05Oct2015;   Last Rx:05Oct2015 Ordered 12. Vantas 50 MG Subcutaneous Kit;   Therapy: (Recorded:22Dec2015) to Recorded  Allergies Medication  1. No Known Drug Allergies  Family History Problems  1. Family history of Cancer : Father 2. Family history of Cancer : Sister  Social History Problems  1. Denied: History of Alcohol Use 2. Caffeine Use 3. Marital History - Currently Married 4. Smoker, current status unknown (F17.200) 5. Tobacco Use   currently taking chantix to quit smoking  Review of Systems Genitourinary, constitutional, skin, eye, otolaryngeal, hematologic/lymphatic, cardiovascular, pulmonary, endocrine, musculoskeletal, gastrointestinal, neurological and psychiatric system(s) were reviewed and pertinent findings if present are noted and are otherwise negative.  Genitourinary: urinary frequency.  Constitutional: recent ~Ulb weight loss.  Neurological: dizziness.    Vitals Vital Signs [Data Includes: Last 1 Day]  Recorded: 22Dec2015 03:27PM  Blood Pressure: 144 / 80 Temperature: 97.5 F Heart Rate: 60  Physical  Exam Constitutional: Well nourished and well developed . No acute distress.  ENT:. Examination of the teeth show poor dentition. Hearing loss is noted.  Neck: The appearance of the neck is normal.  Pulmonary:  No respiratory distress.  Cardiovascular:. No peripheral edema.  Abdomen: The abdomen is flat. The abdomen is soft and nontender. No masses are palpated. No CVA tenderness. No hernias are palpable. No hepatosplenomegaly noted.  Rectal: Rectal exam demonstrates normal sphincter tone, no tenderness and no masses. Estimated prostate size is 4+. Normal rectal tone, no rectal masses, prostate is smooth, symmetric and non-tender. The prostate has no nodularity, is not indurated and is not tender. The left seminal vesicle is nonpalpable. The right seminal vesicle is nonpalpable. The perineum is normal on inspection.  Genitourinary: Examination of the penis demonstrates no discharge, no masses, no lesions and a normal meatus. The penis is uncircumcised. The scrotum is without lesions. The right epididymis is palpably normal and non-tender. The left epididymis is palpably normal and non-tender. The right testis is non-tender and without masses. The left testis is non-tender and without masses.  Lymphatics: The femoral and inguinal nodes are not enlarged or tender.  Skin: Normal skin turgor, no visible rash and no visible skin lesions.    Results/Data PET Scan: NED. Bilateral shoulder arthritis.     Procedure With the left arm extended, the upper arm is prepped with antibiotics, and draped in usual fashion. The elbow is flexed in order to identify the sulcus between the biceps and triceps muscle. 10 cc of Xylocaine, 1% plain is then injected in the subcutaneous tissue in this plane, and a 1/2 cm incision is made with subcutaneous tissue dissection with a blunt clamp. Following this, the Vantas implant is placed without difficulty, and the wound is closed with 2 separate 3-0 Monocryl sutures. Sterile dressings applied.     Assessment Assessed  1. Prostate cancer (C61) 2. Cancer of prostate w/med recur risk (T2b-c or Gleason 7 or PSA 10-20)  (C61) 3. Elevated prostate specific antigen (PSA) (R97.2) 4.  Urinary retention (R33.9) 5. 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 Sodium chloride PET scan is negative, except bilateral shoulder arthritis, and lumbar arthritis, consistent with the patient's pain. There is no evidence of metastatic disease on this examination. The Vantas implant is placed without difficulty. The patient will now return in one year for follow-up. I believe he will do well.   Plan Prostate cancer  1. PSA; Status:Hold For - Specimen/Data Collection,Exact Date; Requested  CHE:[id number];  2. PSA; Status:Hold For - Specimen/Data Collection,Exact Date; Requested  UMP:[id number];  3. PSA; Status:Hold For - Specimen/Data Collection; Requested  for:16Dec2016;  4. PSA; Status:Hold For - Specimen/Data Collection; Requested  for:16Sep2016;  5. Vantas Initial Insertion; Status:Complete;   Done: [id number] 6. Vantas Initial Insertion; Status:Hold For - Date of Service; Requested  for:23Dec2016;  7. Follow-up Year x 1 Office  Follow-up for Vantas replacement has to be  after 12/12/15  Status: Hold For - Date of Service  Requested for:  23Dec2016  PSA and RTC 1 year.   Discussion/Summary cc: Dr. Amada Jupiter Electronically signed by : Carolan Clines, M.D.; Dec 11 2014  5:48PM EST

## 2015-01-14 ENCOUNTER — Ambulatory Visit (HOSPITAL_BASED_OUTPATIENT_CLINIC_OR_DEPARTMENT_OTHER): Payer: Medicare Other | Admitting: Anesthesiology

## 2015-01-14 ENCOUNTER — Encounter (HOSPITAL_BASED_OUTPATIENT_CLINIC_OR_DEPARTMENT_OTHER)
Admission: RE | Disposition: A | Discharge status: Home / Self Care | Payer: Self-pay | Source: Ambulatory Visit | Attending: Urology

## 2015-01-14 ENCOUNTER — Encounter (HOSPITAL_BASED_OUTPATIENT_CLINIC_OR_DEPARTMENT_OTHER): Payer: Self-pay | Admitting: *Deleted

## 2015-01-14 ENCOUNTER — Ambulatory Visit (HOSPITAL_BASED_OUTPATIENT_CLINIC_OR_DEPARTMENT_OTHER)
Admission: RE | Admit: 2015-01-14 | Discharge: 2015-01-14 | Disposition: A | Discharge status: Home / Self Care | Payer: Medicare Other | Source: Ambulatory Visit | Attending: Urology | Admitting: Urology

## 2015-01-14 DIAGNOSIS — E119 Type 2 diabetes mellitus without complications: Secondary | ICD-10-CM | POA: Diagnosis not present

## 2015-01-14 DIAGNOSIS — H409 Unspecified glaucoma: Secondary | ICD-10-CM | POA: Insufficient documentation

## 2015-01-14 DIAGNOSIS — I10 Essential (primary) hypertension: Secondary | ICD-10-CM | POA: Diagnosis not present

## 2015-01-14 DIAGNOSIS — J45909 Unspecified asthma, uncomplicated: Secondary | ICD-10-CM | POA: Diagnosis not present

## 2015-01-14 DIAGNOSIS — M1990 Unspecified osteoarthritis, unspecified site: Secondary | ICD-10-CM | POA: Diagnosis not present

## 2015-01-14 DIAGNOSIS — Z9989 Dependence on other enabling machines and devices: Secondary | ICD-10-CM | POA: Insufficient documentation

## 2015-01-14 DIAGNOSIS — F17200 Nicotine dependence, unspecified, uncomplicated: Secondary | ICD-10-CM | POA: Diagnosis not present

## 2015-01-14 DIAGNOSIS — N401 Enlarged prostate with lower urinary tract symptoms: Secondary | ICD-10-CM | POA: Diagnosis not present

## 2015-01-14 DIAGNOSIS — C61 Malignant neoplasm of prostate: Secondary | ICD-10-CM | POA: Diagnosis present

## 2015-01-14 DIAGNOSIS — Z791 Long term (current) use of non-steroidal anti-inflammatories (NSAID): Secondary | ICD-10-CM | POA: Insufficient documentation

## 2015-01-14 DIAGNOSIS — K219 Gastro-esophageal reflux disease without esophagitis: Secondary | ICD-10-CM | POA: Insufficient documentation

## 2015-01-14 DIAGNOSIS — J449 Chronic obstructive pulmonary disease, unspecified: Secondary | ICD-10-CM | POA: Insufficient documentation

## 2015-01-14 DIAGNOSIS — R972 Elevated prostate specific antigen [PSA]: Secondary | ICD-10-CM | POA: Insufficient documentation

## 2015-01-14 DIAGNOSIS — R339 Retention of urine, unspecified: Secondary | ICD-10-CM | POA: Insufficient documentation

## 2015-01-14 DIAGNOSIS — E785 Hyperlipidemia, unspecified: Secondary | ICD-10-CM | POA: Diagnosis not present

## 2015-01-14 DIAGNOSIS — F329 Major depressive disorder, single episode, unspecified: Secondary | ICD-10-CM | POA: Diagnosis not present

## 2015-01-14 DIAGNOSIS — Z79899 Other long term (current) drug therapy: Secondary | ICD-10-CM | POA: Insufficient documentation

## 2015-01-14 HISTORY — DX: Mixed hyperlipidemia: E78.2

## 2015-01-14 HISTORY — DX: Male erectile dysfunction, unspecified: N52.9

## 2015-01-14 HISTORY — DX: Presence of spectacles and contact lenses: Z97.3

## 2015-01-14 HISTORY — DX: Malignant neoplasm of prostate: C61

## 2015-01-14 HISTORY — DX: Other obstructive and reflux uropathy: N13.8

## 2015-01-14 HISTORY — DX: Essential (primary) hypertension: I10

## 2015-01-14 HISTORY — DX: Personal history of other diseases of the digestive system: Z87.19

## 2015-01-14 HISTORY — DX: Type 2 diabetes mellitus without complications: E11.9

## 2015-01-14 HISTORY — DX: Personal history of other infectious and parasitic diseases: Z86.19

## 2015-01-14 HISTORY — DX: Complete loss of teeth, unspecified cause, unspecified class: K08.109

## 2015-01-14 HISTORY — DX: Chronic obstructive pulmonary disease, unspecified: J44.9

## 2015-01-14 HISTORY — PX: INSERTION OF SUPRAPUBIC CATHETER: SHX5870

## 2015-01-14 HISTORY — DX: Other obstructive and reflux uropathy: N40.1

## 2015-01-14 HISTORY — DX: Diverticulosis of large intestine without perforation or abscess without bleeding: K57.30

## 2015-01-14 HISTORY — DX: Atherosclerotic heart disease of native coronary artery without angina pectoris: I25.10

## 2015-01-14 HISTORY — PX: CRYOABLATION: SHX1415

## 2015-01-14 HISTORY — DX: Presence of dental prosthetic device (complete) (partial): Z97.2

## 2015-01-14 HISTORY — DX: Personal history of other specified conditions: Z87.898

## 2015-01-14 HISTORY — DX: Unspecified osteoarthritis, unspecified site: M19.90

## 2015-01-14 LAB — POCT I-STAT, CHEM 8
BUN: 21 mg/dL (ref 6–23)
CREATININE: 0.9 mg/dL (ref 0.50–1.35)
Calcium, Ion: 1.33 mmol/L — ABNORMAL HIGH (ref 1.13–1.30)
Chloride: 104 mmol/L (ref 96–112)
GLUCOSE: 92 mg/dL (ref 70–99)
HCT: 38 % — ABNORMAL LOW (ref 39.0–52.0)
Hemoglobin: 12.9 g/dL — ABNORMAL LOW (ref 13.0–17.0)
Potassium: 3.9 mmol/L (ref 3.5–5.1)
Sodium: 142 mmol/L (ref 135–145)
TCO2: 24 mmol/L (ref 0–100)

## 2015-01-14 SURGERY — CRYOABLATION, PROSTATE
Anesthesia: General | Site: Prostate

## 2015-01-14 MED ORDER — PHENAZOPYRIDINE HCL 200 MG PO TABS
200.0000 mg | ORAL_TABLET | Freq: Three times a day (TID) | ORAL | Status: DC | PRN
  Administered 2015-01-14: 200 mg via ORAL
  Filled 2015-01-14: qty 1

## 2015-01-14 MED ORDER — CEFAZOLIN SODIUM-DEXTROSE 2-3 GM-% IV SOLR
INTRAVENOUS | Status: AC
  Filled 2015-01-14: qty 50

## 2015-01-14 MED ORDER — LIDOCAINE HCL (CARDIAC) 20 MG/ML IV SOLN
INTRAVENOUS | Status: DC | PRN
Start: 2015-01-14 — End: 2015-01-14
  Administered 2015-01-14: 50 mg via INTRAVENOUS

## 2015-01-14 MED ORDER — DEXAMETHASONE SODIUM PHOSPHATE 4 MG/ML IJ SOLN
INTRAMUSCULAR | Status: DC | PRN
  Administered 2015-01-14: 10 mg via INTRAVENOUS

## 2015-01-14 MED ORDER — CEFAZOLIN SODIUM-DEXTROSE 2-3 GM-% IV SOLR
2.0000 g | INTRAVENOUS | Status: AC
  Administered 2015-01-14: 2 g via INTRAVENOUS
  Filled 2015-01-14: qty 50

## 2015-01-14 MED ORDER — FENTANYL CITRATE 0.05 MG/ML IJ SOLN
INTRAMUSCULAR | Status: AC
  Filled 2015-01-14: qty 2

## 2015-01-14 MED ORDER — OXYCODONE-ACETAMINOPHEN 5-325 MG PO TABS
1.0000 | ORAL_TABLET | Freq: Four times a day (QID) | ORAL | Status: DC | PRN
  Administered 2015-01-14: 1 via ORAL
  Filled 2015-01-14: qty 1

## 2015-01-14 MED ORDER — STERILE WATER FOR IRRIGATION IR SOLN
Status: DC | PRN
  Administered 2015-01-14: 3000 mL
  Administered 2015-01-14: 500 mL

## 2015-01-14 MED ORDER — PROPOFOL 10 MG/ML IV BOLUS
INTRAVENOUS | Status: DC | PRN
  Administered 2015-01-14: 150 mg via INTRAVENOUS
  Administered 2015-01-14: 50 mg via INTRAVENOUS

## 2015-01-14 MED ORDER — FENTANYL CITRATE 0.05 MG/ML IJ SOLN
25.0000 ug | INTRAMUSCULAR | Status: DC | PRN
  Administered 2015-01-14: 25 ug via INTRAVENOUS
  Filled 2015-01-14: qty 1

## 2015-01-14 MED ORDER — FENTANYL CITRATE 0.05 MG/ML IJ SOLN
INTRAMUSCULAR | Status: AC
  Filled 2015-01-14: qty 4

## 2015-01-14 MED ORDER — BELLADONNA ALKALOIDS-OPIUM 16.2-60 MG RE SUPP
RECTAL | Status: AC
  Filled 2015-01-14: qty 1

## 2015-01-14 MED ORDER — KETOROLAC TROMETHAMINE 30 MG/ML IJ SOLN
INTRAMUSCULAR | Status: DC | PRN
  Administered 2015-01-14: 15 mg via INTRAVENOUS

## 2015-01-14 MED ORDER — OXYCODONE-ACETAMINOPHEN 5-325 MG PO TABS
ORAL_TABLET | ORAL | Status: AC
  Filled 2015-01-14: qty 1

## 2015-01-14 MED ORDER — PHENAZOPYRIDINE HCL 200 MG PO TABS: 200.0000 mg | ORAL_TABLET | Freq: Three times a day (TID) | ORAL | Status: DC | PRN

## 2015-01-14 MED ORDER — FENTANYL CITRATE 0.05 MG/ML IJ SOLN
INTRAMUSCULAR | Status: DC | PRN
  Administered 2015-01-14: 25 ug via INTRAVENOUS
  Administered 2015-01-14 (×2): 50 ug via INTRAVENOUS
  Administered 2015-01-14: 25 ug via INTRAVENOUS
  Administered 2015-01-14: 50 ug via INTRAVENOUS

## 2015-01-14 MED ORDER — ONDANSETRON HCL 4 MG/2ML IJ SOLN
4.0000 mg | Freq: Once | INTRAMUSCULAR | Status: DC | PRN
  Filled 2015-01-14: qty 2

## 2015-01-14 MED ORDER — ONDANSETRON HCL 4 MG/2ML IJ SOLN
INTRAMUSCULAR | Status: DC | PRN
  Administered 2015-01-14: 4 mg via INTRAVENOUS

## 2015-01-14 MED ORDER — EPHEDRINE SULFATE 50 MG/ML IJ SOLN
INTRAMUSCULAR | Status: DC | PRN
  Administered 2015-01-14: 10 mg via INTRAVENOUS
  Administered 2015-01-14: 12.5 mg via INTRAVENOUS
  Administered 2015-01-14: 10 mg via INTRAVENOUS

## 2015-01-14 MED ORDER — PHENAZOPYRIDINE HCL 100 MG PO TABS
ORAL_TABLET | ORAL | Status: AC
  Filled 2015-01-14: qty 2

## 2015-01-14 MED ORDER — LACTATED RINGERS IV SOLN
INTRAVENOUS | Status: DC
  Administered 2015-01-14 (×2): via INTRAVENOUS
  Filled 2015-01-14: qty 1000

## 2015-01-14 MED ORDER — TRIMETHOPRIM 100 MG PO TABS: 100.0000 mg | ORAL_TABLET | ORAL | Status: DC

## 2015-01-14 MED ORDER — GLYCOPYRROLATE 0.2 MG/ML IJ SOLN
INTRAMUSCULAR | Status: DC | PRN
  Administered 2015-01-14: 0.2 mg via INTRAVENOUS

## 2015-01-14 MED ORDER — BELLADONNA ALKALOIDS-OPIUM 16.2-60 MG RE SUPP
RECTAL | Status: DC | PRN
  Administered 2015-01-14: 1 via RECTAL

## 2015-01-14 MED ORDER — OXYCODONE-ACETAMINOPHEN 5-325 MG PO TABS: 1.0000 | ORAL_TABLET | ORAL | Status: DC | PRN

## 2015-01-14 MED ORDER — SODIUM CHLORIDE 0.9 % IR SOLN
Status: DC | PRN
  Administered 2015-01-14: 3000 mL

## 2015-01-14 SURGICAL SUPPLY — 42 items
BAG URINE DRAINAGE (UROLOGICAL SUPPLIES) ×2 IMPLANT
BLADE CLIPPER SURG (BLADE) ×4 IMPLANT
BLADE SURG 15 STRL LF DISP TIS (BLADE) IMPLANT
BLADE SURG 15 STRL SS (BLADE) ×4
CANISTER SUCTION 2500CC (MISCELLANEOUS) ×2 IMPLANT
CATH FOLEY 2WAY SLVR  5CC 18FR (CATHETERS) ×4
CATH FOLEY 2WAY SLVR 5CC 18FR (CATHETERS) IMPLANT
CHARGE TECH PROCEDURE ONCURA (LABOR (TRAVEL & OVERTIME)) ×4 IMPLANT
CLOTH BEACON ORANGE TIMEOUT ST (SAFETY) ×4 IMPLANT
COVER LIGHT HANDLE  1/PK (MISCELLANEOUS) ×4
COVER LIGHT HANDLE 1/PK (MISCELLANEOUS) IMPLANT
COVER MAYO STAND STRL (DRAPES) ×4 IMPLANT
COVER TABLE BACK 60X90 (DRAPES) ×4 IMPLANT
DRAPE CAMERA CLOSED 9X96 (DRAPES) ×4 IMPLANT
DRAPE INCISE IOBAN 66X45 STRL (DRAPES) ×4 IMPLANT
DRAPE UNDERBUTTOCKS STRL (DRAPE) ×4 IMPLANT
DRSG TEGADERM 4X4.75 (GAUZE/BANDAGES/DRESSINGS) ×4 IMPLANT
ELECT REM PT RETURN 9FT ADLT (ELECTROSURGICAL) ×4
ELECTRODE REM PT RTRN 9FT ADLT (ELECTROSURGICAL) ×2 IMPLANT
GAS ARGON HIGH PRESSURE (MEDICAL GASES) ×4 IMPLANT
GAS HELIUM HIGH PRESSURE (MEDICAL GASES) ×2 IMPLANT
GLOVE BIO SURGEON STRL SZ 6.5 (GLOVE) ×1 IMPLANT
GLOVE BIO SURGEON STRL SZ7.5 (GLOVE) ×4 IMPLANT
GLOVE BIO SURGEONS STRL SZ 6.5 (GLOVE) ×1
GLOVE BIOGEL PI IND STRL 6.5 (GLOVE) IMPLANT
GLOVE BIOGEL PI INDICATOR 6.5 (GLOVE) ×2
GOWN STRL REUS W/ TWL LRG LVL3 (GOWN DISPOSABLE) IMPLANT
GOWN STRL REUS W/TWL LRG LVL3 (GOWN DISPOSABLE) ×4
GOWN STRL REUS W/TWL XL LVL3 (GOWN DISPOSABLE) ×2 IMPLANT
GUIDEWIRE SUPER STIFF (WIRE) ×4 IMPLANT
HOLDER FOLEY CATH W/STRAP (MISCELLANEOUS) ×4 IMPLANT
IV NS IRRIG 3000ML ARTHROMATIC (IV SOLUTION) ×2 IMPLANT
KIT CRYO ENDOCARE (DISPOSABLE) ×2 IMPLANT
NS IRRIG 500ML POUR BTL (IV SOLUTION) ×2 IMPLANT
PACK CYSTO (CUSTOM PROCEDURE TRAY) ×4 IMPLANT
PLUG CATH AND CAP STER (CATHETERS) ×4 IMPLANT
SET IRRIG Y TYPE TUR BLADDER L (SET/KITS/TRAYS/PACK) ×4 IMPLANT
SPONGE GAUZE 4X4 12PLY STER LF (GAUZE/BANDAGES/DRESSINGS) ×2 IMPLANT
SUT ETHILON 4 0 PS 2 18 (SUTURE) ×2 IMPLANT
UNDERPAD 30X30 INCONTINENT (UNDERPADS AND DIAPERS) ×4 IMPLANT
WATER STERILE IRR 3000ML UROMA (IV SOLUTION) ×4 IMPLANT
WATER STERILE IRR 500ML POUR (IV SOLUTION) ×4 IMPLANT

## 2015-01-14 NOTE — Discharge Instructions (Addendum)
Post Anesthesia Home Care Instructions  Activity: Get plenty of rest for the remainder of the day. A responsible adult should stay with you for 24 hours following the procedure.  For the next 24 hours, DO NOT: -Drive a car -Paediatric nurse -Drink alcoholic beverages -Take any medication unless instructed by your physician -Make any legal decisions or sign important papers.  Meals: Start with liquid foods such as gelatin or soup. Progress to regular foods as tolerated. Avoid greasy, spicy, heavy foods. If nausea and/or vomiting occur, drink only clear liquids until the nausea and/or vomiting subsides. Call your physician if vomiting continues.  Special Instructions/Symptoms: Your throat may feel dry or sore from the anesthesia or the breathing tube placed in your throat during surgery. If this causes discomfort, gargle with warm salt water. The discomfort should disappear within 24 hours     INSTRUCTIONS AFTER CRYOABLATION OF THE PROSTATE  Normal Findings After Cryoablation:   You may experience a number of symptoms after the cryoablation which occur in  some patients.  There is no cause for alarm should this happen.  These    symptoms include:  -Blood in the urine.  When you are discharged after your surgery, your urine   will have some blood in it and may appear red.  This occurs to some degree in all patients after cryoablation and is not cause for alarm.  Your urine should clear approximately 24 hours after the procedure, but may persist for quite a while longer.  -Scrotal and penile swelling and bruising.  This occurs about 2-3 days after   the cryoablation and is caused by tissue swelling which temporarily blocks the drainage of lymph.  This is painless and resolves in less than one week.  Ice packs and lying down for short periods of time during the day will improve the swelling.  -Small amounts of bloody discharge from the end of your penis.  This can  occur for up to 6 weeks after  the procedure and is not cause for alarm.  It is due  to some discharge from the urethra in the area of the prostate.  -Some numbness in the head of the penis.  Occasionally, when a large  amount of freezing has been performed during the procedure, the nerve which  supplies sensation to one or both sides of the head of the penis may be affected. The sensation returns after a number of months.  Call your doctor at [phone] if any of the following occur:               -If you have any pain, fever, or chills.  -If your foley catheter or suprapubic tube is not draining urine.  -If there is decreasing urinary stream.  This may indicate sloughing of some  dead tissue in the area of the prostate near the opening to the bladder.  It may clear on its own but,  if the problem becomes severe, it may require removal of the dead tissue through a cystoscope.  -If you have diarrhea after urination or foul-smelling urine.  These   symptoms may indicate an urethrorectal fistula, which is a hole between the bladder channel and the rectum.  This should be investigated by your doctor.  -If you have any questions or problems.   Diet:  Resume your normal diet.  If you become constipated, you may tryover-the-counter remedies such as Milk of Magnesia. If you are nauseated, vomiting or feel bloated, notify your doctor's office.  DO NOT GIVE YOURSELF ANY ENEMAS OR RECTAL MEDICATIONS.  THE RECTAL WALL IS THIN AFTER CRYOABLATION.  Activity:   -You may have swelling and bruising of the penis and scrotum.  Apply ice packs   to the area behind the scrotum intermittently for 24-48 hours after your surgery to keep the swelling down.  Wearing an athletic supporter (jock strap) might also help.  -Lying flat on your back will also help decrease the swelling.  You may find   when you are sitting up or walking around that the swelling increases.   -You will have  puncture wounds behind your scrotum making it painful to sit   down.   -You may shower on the second day after surgery.  -There are no lifting or driving restrictions.  Use caution while the suprapubic tube   is in place.   Medications:  -You may resume your preoperative medications, except aspirin and other blood   thinning agents.  Unless otherwise instructed, resume your aspirin after your suprapubic tube is removed.  If you are taking Coumadin or other blood thinners, please discuss when to restart these medications with your surgeon.  -Unless otherwise ordered, you will be given prescriptions for the following   medications which you will need to take after your procedure:   *Antibiotics -  to prevent infection while your suprapubic tube is in place.   *Anti-inflammatory - to prevent pain and to decrease the inflammation in   the area of the prostate.  You may take ibuprofen (Motrin, Advil),    acetaminophen (Tylenol) or naproxen (Aleve) as needed for discomfort.   Call your doctor's office at [phone] for an appointment in  weeks.  Patient Signature:  ________________________________________________________  Nurse's Signature:  ________________________________________________________   Indwelling Urinary Catheter Care You have been given a flexible tube (catheter) used to drain the bladder. Catheters are often used when a person has difficulty urinating due to blockage, bleeding, infection, or inability to control bladder or bowel movements (incontinence). A catheter requires daily care to prevent infection and blockage. HOME CARE INSTRUCTIONS  Do the following to reduce the risk of infection. Antibiotic medicines cannot prevent infections. Limit the number of bacteria entering your bladder  Wash your hands for 2 minutes with soapy water before and after handling the catheter.  Wash your bottom and the entire catheter twice daily, as well as after each bowel movement. Wash the tip of the penis or just above the vaginal opening with soap and warm water,  rinse, and then wash the rectal area. Always wash from front to back.  When changing from the leg bag to overnight bag or from the overnight bag to leg bag, thoroughly clean the end of the catheter where it connects to the tubing with an alcohol wipe.  Clean the leg bag and overnight bag daily after use. Replace your drainage bags weekly.  Always keep the tubing and bag below the level of your bladder. This allows your urine to drain properly. Lifting the bag or tubing above the level of your bladder will cause dirty urine to flow back into your bladder. If you must briefly lift the bag higher than your bladder, pinch the catheter or tubing to prevent backflow.  Drink enough water and fluids to keep your urine clear or pale yellow, or as directed by your caregiver. This will flush bacteria out of the bladder. Protect tissues from injury  Attach the catheter to your leg so there is no tension on the  catheter. Use adhesive tape or a leg strap. If you are using adhesive tape, remove any sticky residue left behind by the previous tape you used.  Place your leg bag on your lower leg. Fasten the straps securely and comfortably.  Do not remove the catheter yourself unless you have been instructed how to do so. Keep the urinary pathway open  Check throughout the day to be sure your catheter is working and urine is draining freely. Make sure the tubing does not become kinked.  Do not let the drainage bag overfill. SEEK IMMEDIATE MEDICAL CARE IF:   The catheter becomes blocked. Urine is not draining.  Urine is leaking.  You have any pain.  You have a fever. Document Released: 12/07/2005 Document Revised: 11/23/2012 Document Reviewed: 05/08/2010 [place] [employer] Patient Information 2015 [place], [place]. This information is not intended to replace advice given to you by your health care provider. Make sure you discuss any questions you have with your health care provider.

## 2015-01-14 NOTE — Anesthesia Procedure Notes (Signed)
Procedure Name: LMA Insertion Date/Time: 01/14/2015 11:07 AM Performed by: Mechele Claude Pre-anesthesia Checklist: Patient identified, Emergency Drugs available, Suction available and Patient being monitored Patient Re-evaluated:Patient Re-evaluated prior to inductionOxygen Delivery Method: Circle System Utilized Preoxygenation: Pre-oxygenation with 100% oxygen Intubation Type: IV induction Ventilation: Mask ventilation without difficulty LMA: LMA inserted LMA Size: 5.0 Number of attempts: 1 Airway Equipment and Method: bite block Placement Confirmation: positive ETCO2 Tube secured with: Tape Dental Injury: Teeth and Oropharynx as per pre-operative assessment

## 2015-01-14 NOTE — Interval H&P Note (Signed)
History and Physical Interval Note:  01/14/2015 10:40 AM  Russell [name]  has presented today for surgery, with the diagnosis of PROSTATE CANCER  The various methods of treatment have been discussed with the patient and family. After consideration of risks, benefits and other options for treatment, the patient has consented to  Procedure(s): CRYO ABLATION PROSTATE (N/A) [place] (N/A) as a surgical intervention .  The patient's history has been reviewed, patient examined, no change in status, stable for surgery.  I have reviewed the patient's chart and labs.  Questions were answered to the patient's satisfaction.     Carolan Clines I

## 2015-01-14 NOTE — Transfer of Care (Signed)
Immediate Anesthesia Transfer of Care Note  Patient: Russell [name]  Procedure(s) Performed: Procedure(s) (LRB): CRYO ABLATION PROSTATE (N/A) SUPRAPUBIC TUBE PLACEMENT (N/A)  Patient Location: PACU  Anesthesia Type: General  Level of Consciousness: awake, alert  and oriented  Airway & Oxygen Therapy: Patient Spontanous Breathing and Patient connected to face mask oxygen  Post-op Assessment: Report given to PACU RN and Post -op Vital signs reviewed and stable  Post vital signs: Reviewed and stable  Complications: No apparent anesthesia complications

## 2015-01-14 NOTE — Anesthesia Postprocedure Evaluation (Signed)
  Anesthesia Post-op Note  Patient: Russell [name]  Procedure(s) Performed: Procedure(s) (LRB): CRYO ABLATION PROSTATE (N/A) SUPRAPUBIC TUBE PLACEMENT (N/A)  Patient Location: PACU  Anesthesia Type: General  Level of Consciousness: awake and alert   Airway and Oxygen Therapy: Patient Spontanous Breathing  Post-op Pain: mild  Post-op Assessment: Post-op Vital signs reviewed, Patient's Cardiovascular Status Stable, Respiratory Function Stable, Patent Airway and No signs of Nausea or vomiting  Last Vitals:  Filed Vitals:   01/14/15 1530  BP: 166/90  Pulse: 60  Temp: 36.3 C  Resp: 14    Post-op Vital Signs: stable   Complications: No apparent anesthesia complications

## 2015-01-14 NOTE — Op Note (Signed)
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 Gaynelle Arabian, MD  First assistant: None  Anesthesia:  General LMA  Preparation:  After appropriate pre-anesthesia, the patient was brought to the operative room, placed on the operating table in the dorsal supine position where general LMA anesthesia was introduced. He was then replaced in the dorsal lithotomy position with pubis was prepped with Betadine solution and draped in usual fashion. The patient's history was reviewed, and his prostate ultrasound and biopsies were reviewed. It was noted the patient had a 51.89 mL prostate at the time of biopsy, but that his prostate this morning is only approximately 28 mL, following induction of hormone therapy. In addition, it is noted that the patient has a history of an international prostate symptom score of 28, with severe bladder outlet symptoms, despite tamsulosin therapy. He is now scheduled for suprapubic catheterization, as well as cryotherapy for localized control of his T2c prostate cancer. (Gleason 7 and presents.  Review history:   74 year old male post  Vantas placement & negative PET scan  for hx T2C  prostate cancer. S/p prostate biopsy on 10/22/14. Hx of BPH, with IPSS=20 ( N < 10), and dysplasia of the prostate & elevated PSA. He is s/p a prostate biopsy on 09/15/13 with pathology showing HGPIN in [employer]. Originally referred back by Dr. Marlou Sa for further evaluation of an elevated PSA of 16.24 on 07/12/13. He has some frequency & has been seen in the past for urinary retention. IPSS=24., repeat 20 He has lost 5 lbs over 3 months. Repeat bx shows 10/12 + bx, G 3+4 ( 7) and G 3+3 ( 6). His prostate has shrunk in response to hormone Rx initiaation, but he still is not spontaneously voiding.     Pt was seen in follow-up of ER visit in 2011 for AUR, and placed on tamsulosin with initiation of  voiding, and pvr=51cc. He then did not follow-up, and did not respond to follow-up letter. He stopped his tamsulosin because he was voiding well. Over the last month, however, he has noticed urinary frequency, and IPSS= 29     09/14/14 PSA - 21.78   Past Medical History Problems  1. History of Anxiety (F41.9) 2. History of Asthma (J45.909) 3. History of depression (Z86.59) 4. History of glaucoma (Z86.69) 5. History of hyperlipidemia (Z86.39) 6. History of hypertension (Z86.79)  Statement of  Likelihood of Success: Excellent. TIME-OUT observed.:  Procedure:  With the patient in the dorsal lithotomy position, and after prepping the penis and lower abdomen, the flex pulses scope was placed within the penis. There is no urethral stricture noted. The pendulous urethra is normal. The proximal urethra is normal. The prostate appears to have trilobar BPH with elevated ladder neck. There was no evidence of bladder stone tumor or diverticular formation. The trigone is normal with clear reflux in both orifices. The bladder was filled with approximately 100 mL of water. The patient is placed in slight Trendelenburg position. The scope was removed, and the Lowsley retractors placed. A midline incision was made halfway between the umbilicus and the pubic tubercle, and the Lowsley tractor was passed through the incision site into the wound. An 58 French Foley catheter is then passed antegrade through the prostatic urethra and through the urethra. The Lowsley was removed. The flex pulses scope was then replaced, and the 18 French Foley catheter is then  brought proximalward, into the bladder. 10 mL of sterile water was placed in the balloon. The balloon is snugged against the abdominal wall. The catheter is sutured in place with 4-0 nylon suture.  A second 57 French Foley catheter is then placed within the bladder, and 20 mL of sterile water was placed in the balloon area the scrotum and penis are then retracted  proximalward, with the use of sterile Ioban drape. The ultrasound probe was then placed in the rectum, and the prostate is directly visualized. The prostate size is noted to be 28-1/2 cc in volume. 3 rows of indigo care the probes are placed, after measurements of the prostate show a length of 33 mm, 39 mm, and 33 mm. All rows of needles were placed under fluoroscopic control. Then on the eighth fascia temperature probe was placed, and rectal temperature probe was placed as well. The probes on the right and left sides were again evaluated, to be sure they were in proper position. Following this, the Foley catheter in the urethra was removed, and flex will cystoscopy was a Compass, showing no evidence of any probe within the urethra, or within the bladder. With the flex pole cystoscope in the bladder, a guidewire was passed through the scope, and coiled in the bladder. The scope was removed, and over the guidewire, the urethral warming catheter is passed and remained for the procedure, and then remained 20 minutes after the procedure was over.  With the urethral warming catheter placed, the patient then underwent 2 freeze/thaw cycles using argon for freezing, and helium for thawing. The end of the second cycle was used for natural thawing. Following the second freeze/thaw cycle, the V probes were removed. Sterile dressing was applied to the wound. The patient was given IV Tylenol and IV Toradol and a B and O suppository. The suprapubic catheter was left plugged, and an 71 French urethral Foley catheter was placed to straight drainage. The patient was awakened and taken to recovery room in good condition. He received IV antibiotics prior to the procedure.

## 2015-01-15 LAB — PSA: PSA: 6.47 ng/mL — AB (ref <=4.00)

## 2015-01-16 ENCOUNTER — Encounter (HOSPITAL_BASED_OUTPATIENT_CLINIC_OR_DEPARTMENT_OTHER): Payer: Self-pay | Admitting: Urology

## 2015-01-24 ENCOUNTER — Ambulatory Visit: Payer: Medicaid Other | Admitting: Podiatry

## 2015-02-04 ENCOUNTER — Emergency Department (HOSPITAL_COMMUNITY)
Admission: EM | Admit: 2015-02-04 | Discharge: 2015-02-04 | Disposition: A | Discharge status: Home / Self Care | Payer: Medicare Other | Attending: Emergency Medicine | Admitting: Emergency Medicine

## 2015-02-04 ENCOUNTER — Emergency Department (HOSPITAL_COMMUNITY): Payer: Medicare Other

## 2015-02-04 DIAGNOSIS — Y9389 Activity, other specified: Secondary | ICD-10-CM | POA: Insufficient documentation

## 2015-02-04 DIAGNOSIS — Z720 Tobacco use: Secondary | ICD-10-CM | POA: Diagnosis not present

## 2015-02-04 DIAGNOSIS — H409 Unspecified glaucoma: Secondary | ICD-10-CM | POA: Diagnosis not present

## 2015-02-04 DIAGNOSIS — I10 Essential (primary) hypertension: Secondary | ICD-10-CM | POA: Insufficient documentation

## 2015-02-04 DIAGNOSIS — Z8546 Personal history of malignant neoplasm of prostate: Secondary | ICD-10-CM | POA: Diagnosis not present

## 2015-02-04 DIAGNOSIS — S62521B Displaced fracture of distal phalanx of right thumb, initial encounter for open fracture: Secondary | ICD-10-CM | POA: Insufficient documentation

## 2015-02-04 DIAGNOSIS — Y998 Other external cause status: Secondary | ICD-10-CM | POA: Diagnosis not present

## 2015-02-04 DIAGNOSIS — Z8619 Personal history of other infectious and parasitic diseases: Secondary | ICD-10-CM | POA: Insufficient documentation

## 2015-02-04 DIAGNOSIS — E782 Mixed hyperlipidemia: Secondary | ICD-10-CM | POA: Insufficient documentation

## 2015-02-04 DIAGNOSIS — J449 Chronic obstructive pulmonary disease, unspecified: Secondary | ICD-10-CM | POA: Insufficient documentation

## 2015-02-04 DIAGNOSIS — Y9289 Other specified places as the place of occurrence of the external cause: Secondary | ICD-10-CM | POA: Insufficient documentation

## 2015-02-04 DIAGNOSIS — E119 Type 2 diabetes mellitus without complications: Secondary | ICD-10-CM | POA: Diagnosis not present

## 2015-02-04 DIAGNOSIS — M1990 Unspecified osteoarthritis, unspecified site: Secondary | ICD-10-CM | POA: Diagnosis not present

## 2015-02-04 DIAGNOSIS — Z8659 Personal history of other mental and behavioral disorders: Secondary | ICD-10-CM | POA: Diagnosis not present

## 2015-02-04 DIAGNOSIS — I2510 Atherosclerotic heart disease of native coronary artery without angina pectoris: Secondary | ICD-10-CM | POA: Diagnosis not present

## 2015-02-04 DIAGNOSIS — S6991XA Unspecified injury of right wrist, hand and finger(s), initial encounter: Secondary | ICD-10-CM | POA: Diagnosis present

## 2015-02-04 DIAGNOSIS — K219 Gastro-esophageal reflux disease without esophagitis: Secondary | ICD-10-CM | POA: Insufficient documentation

## 2015-02-04 DIAGNOSIS — W231XXA Caught, crushed, jammed, or pinched between stationary objects, initial encounter: Secondary | ICD-10-CM | POA: Insufficient documentation

## 2015-02-04 DIAGNOSIS — N400 Enlarged prostate without lower urinary tract symptoms: Secondary | ICD-10-CM | POA: Insufficient documentation

## 2015-02-04 DIAGNOSIS — N529 Male erectile dysfunction, unspecified: Secondary | ICD-10-CM | POA: Diagnosis not present

## 2015-02-04 DIAGNOSIS — Z79899 Other long term (current) drug therapy: Secondary | ICD-10-CM | POA: Diagnosis not present

## 2015-02-04 MED ORDER — CEPHALEXIN 250 MG PO CAPS
500.0000 mg | ORAL_CAPSULE | Freq: Once | ORAL | Status: AC
  Administered 2015-02-04: 500 mg via ORAL
  Filled 2015-02-04: qty 2

## 2015-02-04 MED ORDER — OXYCODONE-ACETAMINOPHEN 5-325 MG PO TABS: 1.0000 | ORAL_TABLET | Freq: Four times a day (QID) | ORAL | Status: DC | PRN

## 2015-02-04 MED ORDER — OXYCODONE-ACETAMINOPHEN 5-325 MG PO TABS
1.0000 | ORAL_TABLET | Freq: Once | ORAL | Status: AC
  Administered 2015-02-04: 1 via ORAL
  Filled 2015-02-04: qty 1

## 2015-02-04 MED ORDER — BACITRACIN 500 UNIT/GM EX OINT
1.0000 "application " | TOPICAL_OINTMENT | Freq: Two times a day (BID) | CUTANEOUS | Status: DC
  Administered 2015-02-04: 1 via TOPICAL
  Filled 2015-02-04 (×2): qty 0.9

## 2015-02-04 MED ORDER — CEPHALEXIN 500 MG PO CAPS: 500.0000 mg | ORAL_CAPSULE | Freq: Four times a day (QID) | ORAL | Status: DC

## 2015-02-04 NOTE — ED Notes (Signed)
Pt transported to xray 

## 2015-02-04 NOTE — Progress Notes (Signed)
Orthopedic Tech Progress Note Patient Details:  Russell [name] [birth year] [id number]  Ortho Devices Type of Ortho Device: Finger splint Ortho Device/Splint Location: RUE Ortho Device/Splint Interventions: Ordered, Application   Braulio Bosch 02/04/2015, 7:49 PM

## 2015-02-04 NOTE — ED Notes (Signed)
Presents post injury to right thumb from shutting finger in car door, laceration to thumb, bleeding controlled.

## 2015-02-04 NOTE — ED Notes (Signed)
Right thumb soaked in sterile water and placed gauze on thumb. Bleeding is controlled. sensation intact below injury. Radial pulses +3 equal bilaterally

## 2015-02-04 NOTE — Discharge Instructions (Signed)
Cast or Splint Care Take Tylenol for mild pain or the pain medicine prescribed for bad pain. Take the antibiotic (keflex) as prescribed. Call Dr.Ortmann's office tomorrow to schedule an appointment to be seen in 3 or 4 days. You can tell the office staff that [place] spoke with Dr.Ortmann about your case. Casts and splints support injured limbs and keep bones from moving while they heal.  HOME CARE  Keep the cast or splint uncovered during the drying period.  A plaster cast can take 24 to 48 hours to dry.  A fiberglass cast will dry in less than 1 hour.  Do not rest the cast on anything harder than a pillow for 24 hours.  Do not put weight on your injured limb. Do not put pressure on the cast. Wait for your doctor's approval.  Keep the cast or splint dry.  Cover the cast or splint with a plastic bag during baths or wet weather.  If you have a cast over your chest and belly (trunk), take sponge baths until the cast is taken off.  If your cast gets wet, dry it with a towel or blow dryer. Use the cool setting on the blow dryer.  Keep your cast or splint clean. Wash a dirty cast with a damp cloth.  Do not put any objects under your cast or splint.  Do not scratch the skin under the cast with an object. If itching is a problem, use a blow dryer on a cool setting over the itchy area.  Do not trim or cut your cast.  Do not take out the padding from inside your cast.  Exercise your joints near the cast as told by your doctor.  Raise (elevate) your injured limb on 1 or 2 pillows for the first 1 to 3 days. GET HELP IF:  Your cast or splint cracks.  Your cast or splint is too tight or too loose.  You itch badly under the cast.  Your cast gets wet or has a soft spot.  You have a bad smell coming from the cast.  You get an object stuck under the cast.  Your skin around the cast becomes red or sore.  You have new or more pain after the cast is put on. GET HELP RIGHT AWAY  IF:  You have fluid leaking through the cast.  You cannot move your fingers or toes.  Your fingers or toes turn blue or white or are cool, painful, or puffy (swollen).  You have tingling or lose feeling (numbness) around the injured area.  You have bad pain or pressure under the cast.  You have trouble breathing or have shortness of breath.  You have chest pain. Document Released: 04/08/2011 Document Revised: 08/09/2013 Document Reviewed: 06/15/2013 [facility] Patient Information 2015 [place], [place]. This information is not intended to replace advice given to you by your health care provider. Make sure you discuss any questions you have with your health care provider.

## 2015-02-04 NOTE — ED Provider Notes (Signed)
CSN: [id number]     Arrival date & time 02/04/15  1810 History   First MD Initiated Contact with Patient 02/04/15 1847     Chief Complaint  Patient presents with  . Hand Injury     (Consider location/radiation/quality/duration/timing/severity/associated sxs/prior Treatment) HPI Patient slammed his right thumb into a truck door immediate prior to coming here. He complains of right thumb pain. No other injury. No treatment prior to coming here. Pain is nonradiating not made better or worse by anything, severe present. Past Medical History  Diagnosis Date  . Mitral regurgitation   . Glaucoma   . Depression   . Hypertension   . Hyperlipidemia, mixed   . History of syncope     03-22-2014  DX  VAGAL RESPONSE  . Coronary atherosclerosis   . COPD (chronic obstructive pulmonary disease)   . GERD (gastroesophageal reflux disease)   . Diverticulosis of colon   . History of gastritis   . Prostate cancer   . BPH with obstruction/lower urinary tract symptoms   . ED (erectile dysfunction)   . Arthritis     SHOUDLERS, LUMBAR  . 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 History of scabies     2008  . Type 2 diabetes, diet controlled   . Wears glasses   . Full dentures    Past Surgical History  Procedure Laterality Date  . Knee arthroscopy Right 2008  . Capsulotomy Right 01/26/2013    Procedure: MINOR CAPSULOTOMY;  Surgeon: Myrtha Mantis., MD;  Location: [place];  Service: Ophthalmology;  Laterality: Right;  . Yag laser application Right 05/23/6947    Procedure: YAG LASER APPLICATION;  Surgeon: Myrtha Mantis., MD;  Location: [place];  Service: Ophthalmology;  Laterality: Right;  . I&d extremity Right 05/24/2013    Procedure: RIGHT INDEX WOUND DEBRIDEMENT AND CLOSURE;  Surgeon: Jolyn Nap, MD;  Location: [place];  Service: Orthopedics;  Laterality: Right;  . Foot surgery Left   . Carpectomy Right 03/13/2014    Procedure: RIGHT   PROXIMAL ROW CARPECTOMY;  Surgeon: Tennis Must, MD;  Location: [place];  Service: Orthopedics;  Laterality: Right;  . Carpometacarpal (cmc) fusion of thumb Right 03/13/2014    Procedure: RIGHT FUSION OF THUMB CARPOMETACARPAL [facility]) JOINT;  Surgeon: Tennis Must, MD;  Location: [place];  Service: Orthopedics;  Laterality: Right;  . Cataract extraction w/ intraocular lens  implant, bilateral  2009  . Yag laser capsulotomy, left eye  01-08-2011  . Anterior cervical decomp/discectomy fusion  10-16-2009    C4 -- C6  . Excisional debridement and repair right quadricep tendon  07-05-2000  . Laryngoscopy and esophagoscopy  06-13-2010    MARSUPIALIZATION LEFT LARGE VALLECULAR CYST  . Excision mass left face , suborbital area, plaster reconstruction  02-09-2011  . Shoulder arthroscopy/ debridement labral tear/  biceps tenotomy Left 09-24-2011  . Colonoscopy  08-31-2007  . Esophagogastroduodenoscopy  last one 09-11-2008  . Transthoracic echocardiogram  02-21-2010    normal LVF/  ef 55-60%/ mild to moderate MR/  moderate LAE/  mild TR  . Tonsillectomy  as child  . Cryoablation N/A 01/14/2015    Procedure: CRYO ABLATION PROSTATE;  Surgeon: Ailene Rud, MD;  Location: [facility];  Service: Urology;  Laterality: N/A;  . Insertion of suprapubic catheter N/A 01/14/2015    Procedure: SUPRAPUBIC TUBE PLACEMENT;  Surgeon: Shane Crutch  Gaynelle Arabian, MD;  Location: [facility];  Service: Urology;  Laterality: N/A;   Family History  Problem Relation Age of Onset  . Diabetes    . Cancer     History  Substance Use Topics  . Smoking status: Current Every Day Smoker -- 1.00 packs/day for 51 years    Types: Cigarettes  . Smokeless tobacco: Never Used  . Alcohol Use: No    Review of Systems  Constitutional: Negative.   Musculoskeletal:       Pain at right thumb distal phalanx  Skin: Positive for wound.       Laceration right thumb       Allergies  Review of patient's allergies indicates no known allergies.  Home Medications   Prior to Admission medications   Medication Sig Start Date End Date Taking? Authorizing Provider  albuterol (PROVENTIL HFA;VENTOLIN HFA) 108 (90 BASE) MCG/ACT inhaler Inhale 2 puffs into the lungs every 6 (six) hours as needed for wheezing or shortness of breath. 04/10/13   Nishant Dhungel, MD  bimatoprost (LUMIGAN) 0.01 % SOLN Place 1 drop into both eyes at bedtime. 04/10/13   Nishant Dhungel, MD  brimonidine-timolol (COMBIGAN) 0.2-0.5 % ophthalmic solution Place 1 drop into both eyes 2 (two) times daily.     Historical Provider, MD  dorzolamide (TRUSOPT) 2 % ophthalmic solution Place 1 drop into the left eye 3 (three) times daily.  11/09/13   Historical Provider, MD  esomeprazole (NEXIUM) 40 MG capsule Take 40 mg by mouth every morning.     Historical Provider, MD  losartan (COZAAR) 100 MG tablet Take 1 tablet (100 mg total) by mouth daily. Patient taking differently: Take 100 mg by mouth every morning.  04/10/13   Nishant Dhungel, MD  Multiple Vitamins-Minerals (CENTRUM SILVER ADULT 50+ PO) Take 1 tablet by mouth daily.    Historical Provider, MD  Omega-3 Fatty Acids (FISH OIL) 1000 MG CAPS Take 1 capsule by mouth every morning.     Historical Provider, MD  oxyCODONE-acetaminophen (ROXICET) 5-325 MG per tablet Take 1 tablet by mouth every 4 (four) hours as needed for severe pain. 01/14/15   Ailene Rud, MD  phenazopyridine (PYRIDIUM) 200 MG tablet Take 1 tablet (200 mg total) by mouth 3 (three) times daily as needed for pain. 01/14/15   Ailene Rud, MD  pravastatin (PRAVACHOL) 80 MG tablet Take 1 tablet (80 mg total) by mouth daily. Patient taking differently: Take 80 mg by mouth every morning.  04/10/13   Nishant Dhungel, MD  tamsulosin (FLOMAX) 0.4 MG CAPS capsule Take 0.4 mg by mouth every morning.  11/19/13   Historical Provider, MD  trimethoprim (TRIMPEX) 100 MG tablet Take 1  tablet (100 mg total) by mouth 1 day or 1 dose. 01/14/15   Ailene Rud, MD   BP 140/74 mmHg  Pulse 78  Temp(Src) 98.1 F (36.7 C)  Resp 16  SpO2 100% Physical Exam  Constitutional: He appears well-developed and well-nourished. No distress.  HENT:  Head: Normocephalic and atraumatic.  Eyes: EOM are normal.  Neck: Neck supple.  Cardiovascular: Normal rate.   Pulmonary/Chest: Effort normal.  Abdominal: Bowel sounds are normal. He exhibits no distension.  Musculoskeletal: Normal range of motion. He exhibits tenderness.  Right thumb with 1.5 cm linear laceration immediately proximal and parallel to the cuticle. No active bleeding. There is a tiny subungual hematoma. No soft tissue swelling. No deformity. Full range of motion. Good capillary refill. All other digits with good capillary refill. Full  range of motion neurovascular intact. All other extremities or contusion abrasion or tenderness neurovascular intact  Neurological: He is alert. Coordination normal.  Skin: Skin is warm and dry. No rash noted.  Psychiatric: He has a normal mood and affect.  Nursing note and vitals reviewed.   ED Course  Procedures (including critical care time) Labs Review Labs Reviewed - No data to display  Imaging Review No results found.   EKG Interpretation None     Patient's wound was irrigated under tap water. Antibiotic ointment and a sterile dressing and thumb splint placed on patient by orthopedic technician 8 PM patient is comfortable in splint after treatment with Percocet and Keflex. X-ray viewed by me Results for orders placed or performed during the hospital encounter of 01/14/15  PSA  Result Value Ref Range   PSA 6.47 (H) <=4.00 ng/mL  I-STAT, chem 8  Result Value Ref Range   Sodium 142 135 - 145 mmol/L   Potassium 3.9 3.5 - 5.1 mmol/L   Chloride 104 96 - 112 mmol/L   BUN 21 6 - 23 mg/dL   Creatinine, Ser 0.90 0.50 - 1.35 mg/dL   Glucose, Bld 92 70 - 99 mg/dL   Calcium,  Ion 1.33 (H) 1.13 - 1.30 mmol/L   TCO2 24 0 - 100 mmol/L   Hemoglobin 12.9 (L) 13.0 - 17.0 g/dL   HCT 38.0 (L) 39.0 - 52.0 %   Dg Finger Thumb Right  02/04/2015   CLINICAL DATA:  The patient slammed right thumb in a car door today with distal right thumb pain  EXAM: RIGHT THUMB 2+V  COMPARISON:  None.  FINDINGS: There is comminuted displaced fracture of the right first distal phalanx. Associated soft tissue swelling is identified. Patient is status post prior fixation of the first metacarpal-carpal joint.  IMPRESSION: Comminuted displaced fracture of the right first distal phalanx.   Electronically Signed   By: Abelardo Diesel M.D.   On: 02/04/2015 19:04    MDM  I spoke with Dr.Ortmann. Plan prescription Percocet, Keflex. He will be seen in the office in 3-4 days Diagnosis open fracture distal phalanx of right thumb Final diagnoses:  None        Orlie Dakin, MD 02/04/15 2005

## 2015-02-21 ENCOUNTER — Encounter: Payer: Self-pay | Admitting: Podiatry

## 2015-02-21 ENCOUNTER — Ambulatory Visit (INDEPENDENT_AMBULATORY_CARE_PROVIDER_SITE_OTHER): Payer: Medicare Other | Admitting: Podiatry

## 2015-02-21 VITALS — BP 119/70 | HR 82 | Resp 16

## 2015-02-21 DIAGNOSIS — G5792 Unspecified mononeuropathy of left lower limb: Secondary | ICD-10-CM

## 2015-02-21 NOTE — Progress Notes (Signed)
He presents today complaining of pain to the third and fourth toes of the left foot. Across the top he states that they're exquisitely painful.  Objective: Neuritis third and fourth digits left foot. Pulses remain palpable.  Plan: Injected the dorsal digital nerves today with dehydrated alcohol follow-up within 3 weeks

## 2015-02-22 ENCOUNTER — Inpatient Hospital Stay (HOSPITAL_COMMUNITY)
Admission: EM | Admit: 2015-02-22 | Discharge: 2015-02-26 | DRG: 854 | Disposition: A | Discharge status: Home / Self Care | Payer: Medicare Other | Attending: Internal Medicine | Admitting: Internal Medicine

## 2015-02-22 ENCOUNTER — Encounter (HOSPITAL_COMMUNITY): Payer: Self-pay | Admitting: Emergency Medicine

## 2015-02-22 ENCOUNTER — Emergency Department (HOSPITAL_COMMUNITY): Payer: Medicare Other

## 2015-02-22 DIAGNOSIS — H409 Unspecified glaucoma: Secondary | ICD-10-CM | POA: Diagnosis present

## 2015-02-22 DIAGNOSIS — N451 Epididymitis: Secondary | ICD-10-CM | POA: Diagnosis present

## 2015-02-22 DIAGNOSIS — N452 Orchitis: Secondary | ICD-10-CM | POA: Diagnosis not present

## 2015-02-22 DIAGNOSIS — N454 Abscess of epididymis or testis: Secondary | ICD-10-CM

## 2015-02-22 DIAGNOSIS — E119 Type 2 diabetes mellitus without complications: Secondary | ICD-10-CM | POA: Diagnosis present

## 2015-02-22 DIAGNOSIS — N50819 Testicular pain, unspecified: Secondary | ICD-10-CM

## 2015-02-22 DIAGNOSIS — J449 Chronic obstructive pulmonary disease, unspecified: Secondary | ICD-10-CM | POA: Diagnosis present

## 2015-02-22 DIAGNOSIS — E782 Mixed hyperlipidemia: Secondary | ICD-10-CM | POA: Diagnosis present

## 2015-02-22 DIAGNOSIS — R509 Fever, unspecified: Secondary | ICD-10-CM

## 2015-02-22 DIAGNOSIS — F17210 Nicotine dependence, cigarettes, uncomplicated: Secondary | ICD-10-CM | POA: Diagnosis present

## 2015-02-22 DIAGNOSIS — B965 Pseudomonas (aeruginosa) (mallei) (pseudomallei) as the cause of diseases classified elsewhere: Secondary | ICD-10-CM | POA: Diagnosis present

## 2015-02-22 DIAGNOSIS — I10 Essential (primary) hypertension: Secondary | ICD-10-CM | POA: Diagnosis not present

## 2015-02-22 DIAGNOSIS — L0390 Cellulitis, unspecified: Secondary | ICD-10-CM

## 2015-02-22 DIAGNOSIS — I2510 Atherosclerotic heart disease of native coronary artery without angina pectoris: Secondary | ICD-10-CM | POA: Diagnosis present

## 2015-02-22 DIAGNOSIS — K219 Gastro-esophageal reflux disease without esophagitis: Secondary | ICD-10-CM | POA: Diagnosis present

## 2015-02-22 DIAGNOSIS — A419 Sepsis, unspecified organism: Secondary | ICD-10-CM

## 2015-02-22 DIAGNOSIS — Z8546 Personal history of malignant neoplasm of prostate: Secondary | ICD-10-CM | POA: Diagnosis not present

## 2015-02-22 DIAGNOSIS — N390 Urinary tract infection, site not specified: Secondary | ICD-10-CM

## 2015-02-22 DIAGNOSIS — E11349 Type 2 diabetes mellitus with severe nonproliferative diabetic retinopathy without macular edema: Secondary | ICD-10-CM | POA: Diagnosis not present

## 2015-02-22 DIAGNOSIS — D649 Anemia, unspecified: Secondary | ICD-10-CM

## 2015-02-22 DIAGNOSIS — N453 Epididymo-orchitis: Secondary | ICD-10-CM | POA: Diagnosis present

## 2015-02-22 DIAGNOSIS — D631 Anemia in chronic kidney disease: Secondary | ICD-10-CM

## 2015-02-22 DIAGNOSIS — D509 Iron deficiency anemia, unspecified: Secondary | ICD-10-CM | POA: Diagnosis present

## 2015-02-22 DIAGNOSIS — N401 Enlarged prostate with lower urinary tract symptoms: Secondary | ICD-10-CM | POA: Diagnosis present

## 2015-02-22 DIAGNOSIS — N189 Chronic kidney disease, unspecified: Secondary | ICD-10-CM

## 2015-02-22 LAB — CBC WITH DIFFERENTIAL/PLATELET
Basophils Absolute: 0.1 10*3/uL (ref 0.0–0.1)
Basophils Relative: 0 % (ref 0–1)
Eosinophils Absolute: 0 10*3/uL (ref 0.0–0.7)
Eosinophils Relative: 0 % (ref 0–5)
HCT: 29.6 % — ABNORMAL LOW (ref 39.0–52.0)
Hemoglobin: 9.5 g/dL — ABNORMAL LOW (ref 13.0–17.0)
Lymphocytes Relative: 7 % — ABNORMAL LOW (ref 12–46)
Lymphs Abs: 1.2 10*3/uL (ref 0.7–4.0)
MCH: 24.4 pg — ABNORMAL LOW (ref 26.0–34.0)
MCHC: 32.1 g/dL (ref 30.0–36.0)
MCV: 76.1 fL — ABNORMAL LOW (ref 78.0–100.0)
Monocytes Absolute: 1 10*3/uL (ref 0.1–1.0)
Monocytes Relative: 6 % (ref 3–12)
Neutro Abs: 15.2 10*3/uL — ABNORMAL HIGH (ref 1.7–7.7)
Neutrophils Relative %: 87 % — ABNORMAL HIGH (ref 43–77)
Platelets: 304 10*3/uL (ref 150–400)
RBC: 3.89 MIL/uL — ABNORMAL LOW (ref 4.22–5.81)
RDW: 18.7 % — ABNORMAL HIGH (ref 11.5–15.5)
WBC: 17.4 10*3/uL — ABNORMAL HIGH (ref 4.0–10.5)

## 2015-02-22 LAB — URINALYSIS, ROUTINE W REFLEX MICROSCOPIC
Bilirubin Urine: NEGATIVE
Glucose, UA: NEGATIVE mg/dL
Hgb urine dipstick: NEGATIVE
Ketones, ur: NEGATIVE mg/dL
Nitrite: POSITIVE — AB
Protein, ur: NEGATIVE mg/dL
Specific Gravity, Urine: 1.02 (ref 1.005–1.030)
Urobilinogen, UA: 1 mg/dL (ref 0.0–1.0)
pH: 7 (ref 5.0–8.0)

## 2015-02-22 LAB — COMPREHENSIVE METABOLIC PANEL
ALT: 17 U/L (ref 0–53)
AST: 13 U/L (ref 0–37)
Albumin: 3.6 g/dL (ref 3.5–5.2)
Alkaline Phosphatase: 68 U/L (ref 39–117)
Anion gap: 11 (ref 5–15)
BUN: 16 mg/dL (ref 6–23)
CO2: 25 mmol/L (ref 19–32)
Calcium: 9.2 mg/dL (ref 8.4–10.5)
Chloride: 103 mmol/L (ref 96–112)
Creatinine, Ser: 1.02 mg/dL (ref 0.50–1.35)
GFR calc Af Amer: 82 mL/min — ABNORMAL LOW (ref >90)
GFR calc non Af Amer: 70 mL/min — ABNORMAL LOW (ref >90)
Glucose, Bld: 128 mg/dL — ABNORMAL HIGH (ref 70–99)
Potassium: 3.9 mmol/L (ref 3.5–5.1)
Sodium: 139 mmol/L (ref 135–145)
Total Bilirubin: 0.5 mg/dL (ref 0.3–1.2)
Total Protein: 6.7 g/dL (ref 6.0–8.3)

## 2015-02-22 LAB — URINE MICROSCOPIC-ADD ON

## 2015-02-22 LAB — I-STAT CG4 LACTIC ACID, ED
LACTIC ACID, VENOUS: 1.35 mmol/L (ref 0.5–2.0)
Lactic Acid, Venous: 1.7 mmol/L (ref 0.5–2.0)

## 2015-02-22 MED ORDER — VANCOMYCIN HCL IN DEXTROSE 1-5 GM/200ML-% IV SOLN
1000.0000 mg | Freq: Once | INTRAVENOUS | Status: AC
  Administered 2015-02-22: 1000 mg via INTRAVENOUS
  Filled 2015-02-22: qty 200

## 2015-02-22 MED ORDER — SODIUM CHLORIDE 0.9 % IV BOLUS (SEPSIS)
1000.0000 mL | Freq: Once | INTRAVENOUS | Status: AC
  Administered 2015-02-22: 1000 mL via INTRAVENOUS

## 2015-02-22 MED ORDER — BRIMONIDINE TARTRATE 0.2 % OP SOLN
1.0000 [drp] | Freq: Two times a day (BID) | OPHTHALMIC | Status: DC
  Administered 2015-02-22 – 2015-02-26 (×8): 1 [drp] via OPHTHALMIC
  Filled 2015-02-22: qty 5

## 2015-02-22 MED ORDER — OXYCODONE-ACETAMINOPHEN 5-325 MG PO TABS
1.0000 | ORAL_TABLET | Freq: Four times a day (QID) | ORAL | Status: DC | PRN
  Administered 2015-02-25 (×2): 2 via ORAL
  Filled 2015-02-22 (×4): qty 2

## 2015-02-22 MED ORDER — VANCOMYCIN HCL IN DEXTROSE 750-5 MG/150ML-% IV SOLN
750.0000 mg | Freq: Two times a day (BID) | INTRAVENOUS | Status: DC
  Administered 2015-02-22 – 2015-02-25 (×7): 750 mg via INTRAVENOUS
  Filled 2015-02-22 (×10): qty 150

## 2015-02-22 MED ORDER — ONDANSETRON HCL 4 MG/2ML IJ SOLN: 4.0000 mg | Freq: Four times a day (QID) | INTRAMUSCULAR | Status: DC | PRN

## 2015-02-22 MED ORDER — TAMSULOSIN HCL 0.4 MG PO CAPS
0.4000 mg | ORAL_CAPSULE | Freq: Every morning | ORAL | Status: DC
  Administered 2015-02-23 – 2015-02-26 (×4): 0.4 mg via ORAL
  Filled 2015-02-22 (×4): qty 1

## 2015-02-22 MED ORDER — MORPHINE SULFATE 4 MG/ML IJ SOLN
4.0000 mg | Freq: Once | INTRAMUSCULAR | Status: AC
  Administered 2015-02-22: 4 mg via INTRAVENOUS
  Filled 2015-02-22: qty 1

## 2015-02-22 MED ORDER — ACETAMINOPHEN 650 MG RE SUPP: 650.0000 mg | Freq: Four times a day (QID) | RECTAL | Status: DC | PRN

## 2015-02-22 MED ORDER — TIMOLOL MALEATE 0.5 % OP SOLN
1.0000 [drp] | Freq: Two times a day (BID) | OPHTHALMIC | Status: DC
  Administered 2015-02-22 – 2015-02-26 (×8): 1 [drp] via OPHTHALMIC
  Filled 2015-02-22: qty 5

## 2015-02-22 MED ORDER — MORPHINE SULFATE 2 MG/ML IJ SOLN
1.0000 mg | INTRAMUSCULAR | Status: DC | PRN
  Administered 2015-02-22 – 2015-02-25 (×4): 1 mg via INTRAVENOUS
  Filled 2015-02-22 (×5): qty 1

## 2015-02-22 MED ORDER — ALBUTEROL SULFATE HFA 108 (90 BASE) MCG/ACT IN AERS: 2.0000 | INHALATION_SPRAY | Freq: Four times a day (QID) | RESPIRATORY_TRACT | Status: DC | PRN

## 2015-02-22 MED ORDER — SODIUM CHLORIDE 0.9 % IV SOLN
INTRAVENOUS | Status: DC
  Administered 2015-02-22: 300 mL via INTRAVENOUS
  Administered 2015-02-22: 23:00:00 via INTRAVENOUS
  Administered 2015-02-23 (×2): 1000 mL via INTRAVENOUS
  Administered 2015-02-24 – 2015-02-25 (×2): via INTRAVENOUS

## 2015-02-22 MED ORDER — ONDANSETRON HCL 4 MG PO TABS: 4.0000 mg | ORAL_TABLET | Freq: Four times a day (QID) | ORAL | Status: DC | PRN

## 2015-02-22 MED ORDER — PIPERACILLIN-TAZOBACTAM 3.375 G IVPB 30 MIN
3.3750 g | Freq: Once | INTRAVENOUS | Status: AC
  Administered 2015-02-22: 3.375 g via INTRAVENOUS
  Filled 2015-02-22: qty 50

## 2015-02-22 MED ORDER — ACETAMINOPHEN 325 MG PO TABS
650.0000 mg | ORAL_TABLET | Freq: Four times a day (QID) | ORAL | Status: DC | PRN
  Administered 2015-02-22: 650 mg via ORAL
  Filled 2015-02-22: qty 2

## 2015-02-22 MED ORDER — PIPERACILLIN-TAZOBACTAM 3.375 G IVPB
3.3750 g | Freq: Three times a day (TID) | INTRAVENOUS | Status: DC
  Administered 2015-02-22 – 2015-02-26 (×11): 3.375 g via INTRAVENOUS
  Filled 2015-02-22 (×13): qty 50

## 2015-02-22 MED ORDER — HYDROCODONE-ACETAMINOPHEN 5-325 MG PO TABS
1.0000 | ORAL_TABLET | ORAL | Status: DC | PRN
  Administered 2015-02-22 – 2015-02-26 (×8): 2 via ORAL
  Filled 2015-02-22 (×4): qty 2
  Filled 2015-02-22: qty 1
  Filled 2015-02-22 (×4): qty 2

## 2015-02-22 MED ORDER — DORZOLAMIDE HCL 2 % OP SOLN
1.0000 [drp] | Freq: Three times a day (TID) | OPHTHALMIC | Status: DC
  Administered 2015-02-22 – 2015-02-26 (×12): 1 [drp] via OPHTHALMIC
  Filled 2015-02-22: qty 10

## 2015-02-22 MED ORDER — SODIUM CHLORIDE 0.9 % IV SOLN
INTRAVENOUS | Status: DC
  Administered 2015-02-22: 11:00:00 via INTRAVENOUS

## 2015-02-22 MED ORDER — ONDANSETRON HCL 4 MG/2ML IJ SOLN
4.0000 mg | Freq: Once | INTRAMUSCULAR | Status: AC
  Administered 2015-02-22: 4 mg via INTRAVENOUS
  Filled 2015-02-22: qty 2

## 2015-02-22 MED ORDER — PANTOPRAZOLE SODIUM 40 MG PO TBEC
40.0000 mg | DELAYED_RELEASE_TABLET | Freq: Every day | ORAL | Status: DC
  Administered 2015-02-22 – 2015-02-26 (×5): 40 mg via ORAL
  Filled 2015-02-22 (×5): qty 1

## 2015-02-22 MED ORDER — ACETAMINOPHEN 325 MG PO TABS
650.0000 mg | ORAL_TABLET | Freq: Once | ORAL | Status: AC
  Administered 2015-02-22: 650 mg via ORAL
  Filled 2015-02-22: qty 2

## 2015-02-22 MED ORDER — ALBUTEROL SULFATE (2.5 MG/3ML) 0.083% IN NEBU: 2.5000 mg | INHALATION_SOLUTION | Freq: Four times a day (QID) | RESPIRATORY_TRACT | Status: DC | PRN

## 2015-02-22 MED ORDER — BRIMONIDINE TARTRATE-TIMOLOL 0.2-0.5 % OP SOLN: 1.0000 [drp] | Freq: Two times a day (BID) | OPHTHALMIC | Status: DC

## 2015-02-22 MED ORDER — LATANOPROST 0.005 % OP SOLN
1.0000 [drp] | Freq: Every day | OPHTHALMIC | Status: DC
  Administered 2015-02-22 – 2015-02-25 (×4): 1 [drp] via OPHTHALMIC
  Filled 2015-02-22: qty 2.5

## 2015-02-22 MED ORDER — NICOTINE 14 MG/24HR TD PT24
14.0000 mg | MEDICATED_PATCH | Freq: Every day | TRANSDERMAL | Status: DC
  Administered 2015-02-22 – 2015-02-26 (×5): 14 mg via TRANSDERMAL
  Filled 2015-02-22 (×5): qty 1

## 2015-02-22 NOTE — Consult Note (Signed)
Urology Consult   Physician requesting consult: Sarajane Jews  Reason for consult: Epididymitis  History of Present Illness: Russell [name] is a 74 y.o. male with PMH significant for mitral regurg, glaucoma, HTN, COPD, prostate cancer s/p cryotherapy with SP tube placement 01/15/15, and DM II who presented to the ED with c/o lower abdominal pain, scrotal pain, and F/C x 24 hours.  He denies hematuria and difficulty draining SP tube as well as any drainage around the SP tube. He was in his normal state of health until last night when his sx began. WBC in ED 17.4 and he was febrile with temp of 101.5.  Scrotal U/S revealed epididymitis, orchitis, left hydrocele, and a complex cystic structure within the left testes concerning for an intra-testicular abscess.  UA is nitrite positive with small leukocytes and few bacteria.    His SP tube was changed on our office on 02/15/15.  He currently denies F/C, HA, SOB, and N/V.    Past Medical History  Diagnosis Date  . Mitral regurgitation   . Glaucoma   . Depression   . Hypertension   . Hyperlipidemia, mixed   . History of syncope     03-22-2014  DX  VAGAL RESPONSE  . Coronary atherosclerosis   . COPD (chronic obstructive pulmonary disease)   . GERD (gastroesophageal reflux disease)   . Diverticulosis of colon   . History of gastritis   . Prostate cancer   . BPH with obstruction/lower urinary tract symptoms   . ED (erectile dysfunction)   . Arthritis     SHOUDLERS, LUMBAR  . 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 History of scabies     2008  . Type 2 diabetes, diet controlled   . Wears glasses   . Full dentures     Past Surgical History  Procedure Laterality Date  . Knee arthroscopy Right 2008  . Capsulotomy Right 01/26/2013    Procedure: MINOR CAPSULOTOMY;  Surgeon: Myrtha Mantis., MD;  Location: [place];  Service: Ophthalmology;  Laterality: Right;  . Yag laser application Right 07/27/5783    Procedure:  YAG LASER APPLICATION;  Surgeon: Myrtha Mantis., MD;  Location: [place];  Service: Ophthalmology;  Laterality: Right;  . I&d extremity Right 05/24/2013    Procedure: RIGHT INDEX WOUND DEBRIDEMENT AND CLOSURE;  Surgeon: Jolyn Nap, MD;  Location: [place];  Service: Orthopedics;  Laterality: Right;  . Foot surgery Left   . Carpectomy Right 03/13/2014    Procedure: RIGHT  PROXIMAL ROW CARPECTOMY;  Surgeon: Tennis Must, MD;  Location: [place];  Service: Orthopedics;  Laterality: Right;  . Carpometacarpal (cmc) fusion of thumb Right 03/13/2014    Procedure: RIGHT FUSION OF THUMB CARPOMETACARPAL [facility]) JOINT;  Surgeon: Tennis Must, MD;  Location: [place];  Service: Orthopedics;  Laterality: Right;  . Cataract extraction w/ intraocular lens  implant, bilateral  2009  . Yag laser capsulotomy, left eye  01-08-2011  . Anterior cervical decomp/discectomy fusion  10-16-2009    C4 -- C6  . Excisional debridement and repair right quadricep tendon  07-05-2000  . Laryngoscopy and esophagoscopy  06-13-2010    MARSUPIALIZATION LEFT LARGE VALLECULAR CYST  . Excision mass left face , suborbital area, plaster reconstruction  02-09-2011  . Shoulder arthroscopy/ debridement labral tear/  biceps tenotomy Left 09-24-2011  . Colonoscopy  08-31-2007  . Esophagogastroduodenoscopy  last one  09-11-2008  . Transthoracic echocardiogram  02-21-2010    normal LVF/  ef 55-60%/ mild to moderate MR/  moderate LAE/  mild TR  . Tonsillectomy  as child  . Cryoablation N/A 01/14/2015    Procedure: CRYO ABLATION PROSTATE;  Surgeon: Ailene Rud, MD;  Location: [facility];  Service: Urology;  Laterality: N/A;  . Insertion of suprapubic catheter N/A 01/14/2015    Procedure: SUPRAPUBIC TUBE PLACEMENT;  Surgeon: Ailene Rud, MD;  Location: [facility];  Service: Urology;  Laterality: N/A;    Current Hospital  Medications:  Home Meds:    Medication List    ASK your doctor about these medications        albuterol 108 (90 BASE) MCG/ACT inhaler  Commonly known as:  PROVENTIL HFA;VENTOLIN HFA  Inhale 2 puffs into the lungs every 6 (six) hours as needed for wheezing or shortness of breath.     bimatoprost 0.01 % Soln  Commonly known as:  LUMIGAN  Place 1 drop into both eyes at bedtime.     CENTRUM SILVER ADULT 50+ PO  Take 1 tablet by mouth daily.     cephALEXin 500 MG capsule  Commonly known as:  KEFLEX  Take 1 capsule (500 mg total) by mouth 4 (four) times daily.     COMBIGAN 0.2-0.5 % ophthalmic solution  Generic drug:  brimonidine-timolol  Place 1 drop into both eyes 2 (two) times daily.     dorzolamide 2 % ophthalmic solution  Commonly known as:  TRUSOPT  Place 1 drop into the left eye 3 (three) times daily.     esomeprazole 40 MG capsule  Commonly known as:  NEXIUM  Take 40 mg by mouth every morning.     Fish Oil 1000 MG Caps  Take 1 capsule by mouth every morning.     losartan 100 MG tablet  Commonly known as:  COZAAR  Take 1 tablet (100 mg total) by mouth daily.     oxyCODONE-acetaminophen 5-325 MG per tablet  Commonly known as:  PERCOCET  Take 1-2 tablets by mouth every 6 (six) hours as needed for moderate pain or severe pain.     phenazopyridine 200 MG tablet  Commonly known as:  PYRIDIUM  Take 1 tablet (200 mg total) by mouth 3 (three) times daily as needed for pain.     pravastatin 80 MG tablet  Commonly known as:  PRAVACHOL  Take 1 tablet (80 mg total) by mouth daily.     tamsulosin 0.4 MG Caps capsule  Commonly known as:  FLOMAX  Take 0.4 mg by mouth every morning.     trimethoprim 100 MG tablet  Commonly known as:  TRIMPEX  Take 1 tablet (100 mg total) by mouth 1 day or 1 dose.        Scheduled Meds: . vancomycin  750 mg Intravenous Q12H   Continuous Infusions: . sodium chloride 125 mL/hr at 02/22/15 1043  . piperacillin-tazobactam (ZOSYN)  IV      PRN Meds:.  Allergies: No Known Allergies  Family History  Problem Relation Age of Onset  . Diabetes    . Cancer      Social History:  reports that he has been smoking Cigarettes.  He has a 51 pack-year smoking history. He has never used smokeless tobacco. He reports that he does not drink alcohol or use illicit drugs.  ROS: A complete review of systems was performed.  All systems are negative except for pertinent findings  as noted.  Physical Exam:  Vital signs in last 24 hours: Temp:  [100.5 F (38.1 C)-101.2 F (38.4 C)] 100.5 F (38.1 C) (03/04 0853) Pulse Rate:  [65-83] 68 (03/04 1430) Resp:  [14-16] 14 (03/04 1400) BP: (105-147)/(41-81) 145/65 mmHg (03/04 1430) SpO2:  [92 %-100 %] 97 % (03/04 1430) Weight:  [70.761 kg (156 lb)] 70.761 kg (156 lb) (03/04 0739) Constitutional:  Alert and oriented, No acute distress Cardiovascular: Regular rate and rhythm Respiratory: Normal respiratory effort GI: Abdomen is soft, nontender, nondistended, no abdominal masses GU: low suprapubic tenderness; SP in place with clean site; some urine drainage from penis; minimal edema but very tender left hemiscrotum; no erythema; palp left hydrocele and swollen epididymis; right hemiscrotum and testis WNL Lymphatic: possible lymphadenopathy left groin  Neurologic: Grossly intact, no focal deficits Psychiatric: Normal mood and affect  Laboratory Data:   Recent Labs  02/22/15 0748  WBC 17.4*  HGB 9.5*  HCT 29.6*  PLT 304     Recent Labs  02/22/15 0748  NA 139  K 3.9  CL 103  GLUCOSE 128*  BUN 16  CALCIUM 9.2  CREATININE 1.02     Results for orders placed or performed during the hospital encounter of 02/22/15 (from the past 24 hour(s))  CBC with Differential     Status: Abnormal   Collection Time: 02/22/15  7:48 AM  Result Value Ref Range   WBC 17.4 (H) 4.0 - 10.5 K/uL   RBC 3.89 (L) 4.22 - 5.81 MIL/uL   Hemoglobin 9.5 (L) 13.0 - 17.0 g/dL   HCT 29.6 (L) 39.0 - 52.0  %   MCV 76.1 (L) 78.0 - 100.0 fL   MCH 24.4 (L) 26.0 - 34.0 pg   MCHC 32.1 30.0 - 36.0 g/dL   RDW 18.7 (H) 11.5 - 15.5 %   Platelets 304 150 - 400 K/uL   Neutrophils Relative % 87 (H) 43 - 77 %   Neutro Abs 15.2 (H) 1.7 - 7.7 K/uL   Lymphocytes Relative 7 (L) 12 - 46 %   Lymphs Abs 1.2 0.7 - 4.0 K/uL   Monocytes Relative 6 3 - 12 %   Monocytes Absolute 1.0 0.1 - 1.0 K/uL   Eosinophils Relative 0 0 - 5 %   Eosinophils Absolute 0.0 0.0 - 0.7 K/uL   Basophils Relative 0 0 - 1 %   Basophils Absolute 0.1 0.0 - 0.1 K/uL  Comprehensive metabolic panel     Status: Abnormal   Collection Time: 02/22/15  7:48 AM  Result Value Ref Range   Sodium 139 135 - 145 mmol/L   Potassium 3.9 3.5 - 5.1 mmol/L   Chloride 103 96 - 112 mmol/L   CO2 25 19 - 32 mmol/L   Glucose, Bld 128 (H) 70 - 99 mg/dL   BUN 16 6 - 23 mg/dL   Creatinine, Ser 1.02 0.50 - 1.35 mg/dL   Calcium 9.2 8.4 - 10.5 mg/dL   Total Protein 6.7 6.0 - 8.3 g/dL   Albumin 3.6 3.5 - 5.2 g/dL   AST 13 0 - 37 U/L   ALT 17 0 - 53 U/L   Alkaline Phosphatase 68 39 - 117 U/L   Total Bilirubin 0.5 0.3 - 1.2 mg/dL   GFR calc non Af Amer 70 (L) >90 mL/min   GFR calc Af Amer 82 (L) >90 mL/min   Anion gap 11 5 - 15  I-Stat CG4 Lactic Acid, ED     Status: None   Collection  Time: 02/22/15  8:04 AM  Result Value Ref Range   Lactic Acid, Venous 1.35 0.5 - 2.0 mmol/L  Urinalysis, Routine w reflex microscopic     Status: Abnormal   Collection Time: 02/22/15  9:05 AM  Result Value Ref Range   Color, Urine YELLOW YELLOW   APPearance CLEAR CLEAR   Specific Gravity, Urine 1.020 1.005 - 1.030   pH 7.0 5.0 - 8.0   Glucose, UA NEGATIVE NEGATIVE mg/dL   Hgb urine dipstick NEGATIVE NEGATIVE   Bilirubin Urine NEGATIVE NEGATIVE   Ketones, ur NEGATIVE NEGATIVE mg/dL   Protein, ur NEGATIVE NEGATIVE mg/dL   Urobilinogen, UA 1.0 0.0 - 1.0 mg/dL   Nitrite POSITIVE (A) NEGATIVE   Leukocytes, UA SMALL (A) NEGATIVE  Urine microscopic-add on     Status:  Abnormal   Collection Time: 02/22/15  9:05 AM  Result Value Ref Range   Squamous Epithelial / LPF RARE RARE   WBC, UA 3-6 <3 WBC/hpf   RBC / HPF 0-2 <3 RBC/hpf   Bacteria, UA FEW (A) RARE   Urine-Other MUCOUS PRESENT   I-Stat CG4 Lactic Acid, ED     Status: None   Collection Time: 02/22/15 10:37 AM  Result Value Ref Range   Lactic Acid, Venous 1.70 0.5 - 2.0 mmol/L   No results found for this or any previous visit (from the past 240 hour(s)).  Renal Function:  Recent Labs  02/22/15 0748  CREATININE 1.02   Estimated Creatinine Clearance: 63.6 mL/min (by C-G formula based on Cr of 1.02).  Radiologic Imaging: Dg Chest 2 View  02/22/2015   CLINICAL DATA:  One day history of cough and fever  EXAM: CHEST  2 VIEW  COMPARISON:  December 19, 2014  FINDINGS: There is no edema or consolidation. Heart size and pulmonary vascularity are normal. No adenopathy. End plate concavity is noted in several lower thoracic vertebral bodies consistent with osteoporotic change. There is postoperative change in the lower cervical spine region.  IMPRESSION: No edema or consolidation.  Evidence of osteoporosis.   Electronically Signed   By: Lowella Grip III M.D.   On: 02/22/2015 08:27   US Scrotum  02/22/2015   CLINICAL DATA:  Scrotal pain, more severe on the left than on the right  EXAM: SCROTAL ULTRASOUND  DOPPLER ULTRASOUND OF THE TESTICLES  TECHNIQUE: Complete ultrasound examination of the testicles, epididymis, and other scrotal structures was performed. Color and spectral Doppler ultrasound were also utilized to evaluate blood flow to the testicles.  COMPARISON:  None.  FINDINGS: Right testicle  Measurements: 4.2 x 1.7 x 2.8 cm. No mass or microlithiasis visualized.  Left testicle  Measurements: 3.2 x 1.8 x 2.3 cm. No microlithiasis visualized. Within the left testis there is a complex cystic appearing area measuring 1.8 x 0.8 x 1.4 cm. This area is does not show appreciable color flow. There is extensive  color flow elsewhere throughout the left testis.  Right epididymis:  Normal in size and appearance.  Left epididymis: The left epididymis is diffusely enlarged and hyperemic. No well-defined mass is seen in the left epididymis.  Hydrocele: There is a moderate hydrocele on the left. There is no appreciable hydrocele on the right.  Varicocele:  None visualized.  Pulsed Doppler interrogation of both testes demonstrates normal low resistance arterial and venous waveforms bilaterally. The peak systolic velocity in the right testis is 15.5 cm/sec with an end-diastolic velocity of 2.0 cm/sec. The peak systolic velocity on the left is 5.9 cm with an  end-diastolic velocity of 2.6 cm.  IMPRESSION: Evidence of epididymitis and orchitis on the left. A complex cystic area within the left testis is concerning for an intratesticular abscess. It should be noted that an atypical neoplasm cannot be excluded in this circumstance, and a repeat study after treatment for epididymitis and orchitis is advised to further evaluate this lesion within the left testis. There is a moderate hydrocele on the left as well.  No lesion is identified in the right scrotum. No testicular mass or torsion on the right.   Electronically Signed   By: Lowella Grip III M.D.   On: 02/22/2015 09:21   [place] Art/ven Flow Abd Pelv Doppler  02/22/2015   CLINICAL DATA:  Scrotal pain, more severe on the left than on the right  EXAM: SCROTAL ULTRASOUND  DOPPLER ULTRASOUND OF THE TESTICLES  TECHNIQUE: Complete ultrasound examination of the testicles, epididymis, and other scrotal structures was performed. Color and spectral Doppler ultrasound were also utilized to evaluate blood flow to the testicles.  COMPARISON:  None.  FINDINGS: Right testicle  Measurements: 4.2 x 1.7 x 2.8 cm. No mass or microlithiasis visualized.  Left testicle  Measurements: 3.2 x 1.8 x 2.3 cm. No microlithiasis visualized. Within the left testis there is a complex cystic appearing area  measuring 1.8 x 0.8 x 1.4 cm. This area is does not show appreciable color flow. There is extensive color flow elsewhere throughout the left testis.  Right epididymis:  Normal in size and appearance.  Left epididymis: The left epididymis is diffusely enlarged and hyperemic. No well-defined mass is seen in the left epididymis.  Hydrocele: There is a moderate hydrocele on the left. There is no appreciable hydrocele on the right.  Varicocele:  None visualized.  Pulsed Doppler interrogation of both testes demonstrates normal low resistance arterial and venous waveforms bilaterally. The peak systolic velocity in the right testis is 15.5 cm/sec with an end-diastolic velocity of 2.0 cm/sec. The peak systolic velocity on the left is 5.9 cm with an end-diastolic velocity of 2.6 cm.  IMPRESSION: Evidence of epididymitis and orchitis on the left. A complex cystic area within the left testis is concerning for an intratesticular abscess. It should be noted that an atypical neoplasm cannot be excluded in this circumstance, and a repeat study after treatment for epididymitis and orchitis is advised to further evaluate this lesion within the left testis. There is a moderate hydrocele on the left as well.  No lesion is identified in the right scrotum. No testicular mass or torsion on the right.   Electronically Signed   By: Lowella Grip III M.D.   On: 02/22/2015 09:21     Impression/Recommendation  Left Epididymal orchitis--agree with admission and IV ABx.  Pt already started on Zosyn and Vanc.  Pain control with PO/IV meds and scrotal elevation with ice packs. Cryotherapy can result in a sterile testicular abscess which is likely the case in this pt.  However, if he fails to improve and intratesticular abscess is thought to be infectious only treatment option would be orchiectomy.  Follow clinically and monitor WBC over next 48 hours.  If no improvement will need to repeat scrotal U/S and consider more aggressive  intervention.   Possible UTI--f/u urine culture.  No need for SP tube change at this time. Keep site clean and dry.   Debbrah Alar 02/22/2015, 3:29 PM   Patient was seen, examined,treatment plan was discussed with the Advance Practice Provider.  I have directly reviewed the clinical findings,  lab, imaging studies and management of this patient in detail. I have made the necessary changes and/or additions to the above noted documentation, and agree with the documentation, as recorded by the Advance Practice Provider.   He clearly has left epididymitis. His epididymis is extremely tender to palpation with less discomfort upon palpation of his testicle itself. The question was raised by the radiologist about a possible intratesticular abscess. While this is certainly a possibility it is something that neither myself nor for other urologists in my practice who I discussed this case with have seen. Dr. Gaynelle Arabian however is aware of the study that has linked sterile abscesses of the testicle with cryotherapy. Due to his age the probability of a testicular neoplasm is also quite low and this certainly does not have the appearance of a neoplasm. It may have been present for some time as this is the only imaging study of his testicle that is available. I have discussed with the patient the fact that  his pain is undoubtedly secondary to an epididymitis and he is on appropriate antibiotic therapy for very broad coverage. I would agree with this completely and have told him that I would agree with a conservative course of management initially. This would include serial white blood cell counts, follow-up on his urine culture results and serial examinations. If he should fail to improve or worsen clinically then surgical intervention would be necessary. I have told the patient that an intratesticular abscess if present cannot be drained surgically. This would require the removal of his left testicle. He understands  this and I am going to proceed conservatively.

## 2015-02-22 NOTE — H&P (Signed)
History and Physical  Russell [name] AYT:[id number] DOB: May 15, 1941 DOA: 02/22/2015  Referring physician: Dr. Raliegh Ip Ward in ED PCP: Kevan Ny, MD  Urologist: Dr. Gaynelle Arabian  Chief Complaint: pain around suprapubic catheter  HPI:  74 year old man with history of prostate cancer, significant BPH requiring suprapubic catheter placement 01/14/2015 with cryotherapy of the prostate the same time. Presented to the emergency department with pain around suprapubic catheter, fever, vomiting in the last 24 hours as well as left testicular pain.  He reports sudden onset the last 24 hours of lower abdominal pain, purulent exudate around suprapubic catheter, severe left testicular tenderness and pain aggravated by movement. No specific aggravating or alleviating factors. No other complaints.   In the emergency department temperature 101.5, vital signs stable, no hypotension. No hypoxia. Complete metabolic panel unremarkable, lactic acid 1.35, WBC 17.4, hemoglobin 9.5. Urinalysis equivocal. Chest x-ray no acute disease. COPD. Scrotal and testicular ultrasound suggested epididymitis and orchitis on the left. Complex cystic structure within left testes concerning for intra-testicular abscess. Emergency department physician discussed care with urologist Dr. Karsten Ro who recommended empiric antibiotics, he will see in consultation.  Review of Systems:  Negative for fever, visual changes, sore throat, rash, new muscle aches, chest pain, SOB, dysuria, bleeding.  Positive for nausea, vomiting x2.  Past Medical History  Diagnosis Date  . Mitral regurgitation   . Glaucoma   . Depression   . Hypertension   . Hyperlipidemia, mixed   . History of syncope     03-22-2014  DX  VAGAL RESPONSE  . Coronary atherosclerosis   . COPD (chronic obstructive pulmonary disease)   . GERD (gastroesophageal reflux disease)   . Diverticulosis of colon   . History of gastritis   . Prostate cancer   . BPH with  obstruction/lower urinary tract symptoms   . ED (erectile dysfunction)   . Arthritis     SHOUDLERS, LUMBAR  . 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 History of scabies     2008  . Type 2 diabetes, diet controlled   . Wears glasses   . Full dentures     Past Surgical History  Procedure Laterality Date  . Knee arthroscopy Right 2008  . Capsulotomy Right 01/26/2013    Procedure: MINOR CAPSULOTOMY;  Surgeon: Myrtha Mantis., MD;  Location: [place];  Service: Ophthalmology;  Laterality: Right;  . Yag laser application Right 02/23/5731    Procedure: YAG LASER APPLICATION;  Surgeon: Myrtha Mantis., MD;  Location: [place];  Service: Ophthalmology;  Laterality: Right;  . I&d extremity Right 05/24/2013    Procedure: RIGHT INDEX WOUND DEBRIDEMENT AND CLOSURE;  Surgeon: Jolyn Nap, MD;  Location: [place];  Service: Orthopedics;  Laterality: Right;  . Foot surgery Left   . Carpectomy Right 03/13/2014    Procedure: RIGHT  PROXIMAL ROW CARPECTOMY;  Surgeon: Tennis Must, MD;  Location: [place];  Service: Orthopedics;  Laterality: Right;  . Carpometacarpal (cmc) fusion of thumb Right 03/13/2014    Procedure: RIGHT FUSION OF THUMB CARPOMETACARPAL [facility]) JOINT;  Surgeon: Tennis Must, MD;  Location: [place];  Service: Orthopedics;  Laterality: Right;  . Cataract extraction w/ intraocular lens  implant, bilateral  2009  . Yag laser capsulotomy, left eye  01-08-2011  . Anterior cervical decomp/discectomy fusion  10-16-2009    C4 -- C6  . Excisional debridement and repair right quadricep tendon  07-05-2000  . Laryngoscopy and esophagoscopy  06-13-2010    MARSUPIALIZATION LEFT LARGE VALLECULAR CYST  . Excision mass left face , suborbital area, plaster reconstruction  02-09-2011  . Shoulder arthroscopy/ debridement labral tear/  biceps tenotomy Left 09-24-2011  . Colonoscopy  08-31-2007  .  Esophagogastroduodenoscopy  last one 09-11-2008  . Transthoracic echocardiogram  02-21-2010    normal LVF/  ef 55-60%/ mild to moderate MR/  moderate LAE/  mild TR  . Tonsillectomy  as child  . Cryoablation N/A 01/14/2015    Procedure: CRYO ABLATION PROSTATE;  Surgeon: Ailene Rud, MD;  Location: [facility];  Service: Urology;  Laterality: N/A;  . Insertion of suprapubic catheter N/A 01/14/2015    Procedure: SUPRAPUBIC TUBE PLACEMENT;  Surgeon: Ailene Rud, MD;  Location: [facility];  Service: Urology;  Laterality: N/A;    Social History:  reports that he has been smoking Cigarettes.  He has a 51 pack-year smoking history. He has never used smokeless tobacco. He reports that he does not drink alcohol or use illicit drugs.  No Known Allergies  Family History  Problem Relation Age of Onset  . Diabetes    . Cancer    denies any particular medical problems in family   Prior to Admission medications   Medication Sig Start Date End Date Taking? Authorizing Provider  albuterol (PROVENTIL HFA;VENTOLIN HFA) 108 (90 BASE) MCG/ACT inhaler Inhale 2 puffs into the lungs every 6 (six) hours as needed for wheezing or shortness of breath. 04/10/13  Yes Nishant Dhungel, MD  bimatoprost (LUMIGAN) 0.01 % SOLN Place 1 drop into both eyes at bedtime. 04/10/13  Yes Nishant Dhungel, MD  brimonidine-timolol (COMBIGAN) 0.2-0.5 % ophthalmic solution Place 1 drop into both eyes 2 (two) times daily.    Yes Historical Provider, MD  cephALEXin (KEFLEX) 500 MG capsule Take 1 capsule (500 mg total) by mouth 4 (four) times daily. 02/04/15  Yes Orlie Dakin, MD  dorzolamide (TRUSOPT) 2 % ophthalmic solution Place 1 drop into the left eye 3 (three) times daily.  11/09/13  Yes Historical Provider, MD  esomeprazole (NEXIUM) 40 MG capsule Take 40 mg by mouth every morning.    Yes Historical Provider, MD  losartan (COZAAR) 100 MG tablet Take 1 tablet (100 mg total) by mouth  daily. Patient taking differently: Take 100 mg by mouth every morning.  04/10/13  Yes Nishant Dhungel, MD  Multiple Vitamins-Minerals (CENTRUM SILVER ADULT 50+ PO) Take 1 tablet by mouth daily.   Yes Historical Provider, MD  Omega-3 Fatty Acids (FISH OIL) 1000 MG CAPS Take 1 capsule by mouth every morning.    Yes Historical Provider, MD  oxyCODONE-acetaminophen (PERCOCET) 5-325 MG per tablet Take 1-2 tablets by mouth every 6 (six) hours as needed for moderate pain or severe pain. 02/04/15  Yes Orlie Dakin, MD  phenazopyridine (PYRIDIUM) 200 MG tablet Take 1 tablet (200 mg total) by mouth 3 (three) times daily as needed for pain. 01/14/15  Yes Ailene Rud, MD  pravastatin (PRAVACHOL) 80 MG tablet Take 1 tablet (80 mg total) by mouth daily. Patient taking differently: Take 80 mg by mouth every morning.  04/10/13  Yes Nishant Dhungel, MD  tamsulosin (FLOMAX) 0.4 MG CAPS capsule Take 0.4 mg by mouth every morning.  11/19/13  Yes Historical Provider, MD  trimethoprim (TRIMPEX) 100 MG tablet Take 1 tablet (100 mg total) by mouth 1 day or 1 dose. 01/14/15  Yes Ailene Rud, MD   Physical Exam: Danley Danker  Vitals:   02/22/15 0853 02/22/15 0900 02/22/15 0915 02/22/15 0930  BP: 126/81 138/72 133/64 131/70  Pulse: 73 79 82 83  Temp: 100.5 F (38.1 C)     TempSrc: Oral     Resp: 16     Height:      Weight:      SpO2: 99% 99% 96% 96%    General: Examined in the emergency department. Appears calm, moderately uncomfortable, nontoxic. Eyes: PERRL, normal lids, irises. Arcus senilis seen. ENT: grossly normal hearing, lips & tongue Neck: Appears grossly unremarkable Cardiovascular: RRR, no m/r/g. No LE edema. Respiratory: CTA bilaterally, no w/r/r. Normal respiratory effort. Abdomen: soft, some lower abdominal tenderness noted. Exam benign. Skin: There is some purulent exudate around the suprapubic catheter. Musculoskeletal: grossly normal tone BUE/BLE Psychiatric: grossly normal mood and  affect, speech fluent and appropriate Neurologic: grossly non-focal.  Wt Readings from Last 3 Encounters:  02/22/15 70.761 kg (156 lb)  01/14/15 68.493 kg (151 lb)  04/10/14 76.658 kg (169 lb)    Labs on Admission:  Basic Metabolic Panel:  Recent Labs Lab 02/22/15 0748  NA 139  K 3.9  CL 103  CO2 25  GLUCOSE 128*  BUN 16  CREATININE 1.02  CALCIUM 9.2    Liver Function Tests:  Recent Labs Lab 02/22/15 0748  AST 13  ALT 17  ALKPHOS 68  BILITOT 0.5  PROT 6.7  ALBUMIN 3.6   CBC:  Recent Labs Lab 02/22/15 0748  WBC 17.4*  NEUTROABS 15.2*  HGB 9.5*  HCT 29.6*  MCV 76.1*  PLT 304    Radiological Exams on Admission: Dg Chest 2 View  02/22/2015   CLINICAL DATA:  One day history of cough and fever  EXAM: CHEST  2 VIEW  COMPARISON:  December 19, 2014  FINDINGS: There is no edema or consolidation. Heart size and pulmonary vascularity are normal. No adenopathy. End plate concavity is noted in several lower thoracic vertebral bodies consistent with osteoporotic change. There is postoperative change in the lower cervical spine region.  IMPRESSION: No edema or consolidation.  Evidence of osteoporosis.   Electronically Signed   By: Lowella Grip III M.D.   On: 02/22/2015 08:27   US Scrotum  02/22/2015   CLINICAL DATA:  Scrotal pain, more severe on the left than on the right  EXAM: SCROTAL ULTRASOUND  DOPPLER ULTRASOUND OF THE TESTICLES  TECHNIQUE: Complete ultrasound examination of the testicles, epididymis, and other scrotal structures was performed. Color and spectral Doppler ultrasound were also utilized to evaluate blood flow to the testicles.  COMPARISON:  None.  FINDINGS: Right testicle  Measurements: 4.2 x 1.7 x 2.8 cm. No mass or microlithiasis visualized.  Left testicle  Measurements: 3.2 x 1.8 x 2.3 cm. No microlithiasis visualized. Within the left testis there is a complex cystic appearing area measuring 1.8 x 0.8 x 1.4 cm. This area is does not show appreciable  color flow. There is extensive color flow elsewhere throughout the left testis.  Right epididymis:  Normal in size and appearance.  Left epididymis: The left epididymis is diffusely enlarged and hyperemic. No well-defined mass is seen in the left epididymis.  Hydrocele: There is a moderate hydrocele on the left. There is no appreciable hydrocele on the right.  Varicocele:  None visualized.  Pulsed Doppler interrogation of both testes demonstrates normal low resistance arterial and venous waveforms bilaterally. The peak systolic velocity in the right testis is 15.5 cm/sec with an end-diastolic velocity of 2.0 cm/sec. The peak systolic  velocity on the left is 5.9 cm with an end-diastolic velocity of 2.6 cm.  IMPRESSION: Evidence of epididymitis and orchitis on the left. A complex cystic area within the left testis is concerning for an intratesticular abscess. It should be noted that an atypical neoplasm cannot be excluded in this circumstance, and a repeat study after treatment for epididymitis and orchitis is advised to further evaluate this lesion within the left testis. There is a moderate hydrocele on the left as well.  No lesion is identified in the right scrotum. No testicular mass or torsion on the right.   Electronically Signed   By: Lowella Grip III M.D.   On: 02/22/2015 09:21   [place] Art/ven Flow Abd Pelv Doppler  02/22/2015   CLINICAL DATA:  Scrotal pain, more severe on the left than on the right  EXAM: SCROTAL ULTRASOUND  DOPPLER ULTRASOUND OF THE TESTICLES  TECHNIQUE: Complete ultrasound examination of the testicles, epididymis, and other scrotal structures was performed. Color and spectral Doppler ultrasound were also utilized to evaluate blood flow to the testicles.  COMPARISON:  None.  FINDINGS: Right testicle  Measurements: 4.2 x 1.7 x 2.8 cm. No mass or microlithiasis visualized.  Left testicle  Measurements: 3.2 x 1.8 x 2.3 cm. No microlithiasis visualized. Within the left testis there is a  complex cystic appearing area measuring 1.8 x 0.8 x 1.4 cm. This area is does not show appreciable color flow. There is extensive color flow elsewhere throughout the left testis.  Right epididymis:  Normal in size and appearance.  Left epididymis: The left epididymis is diffusely enlarged and hyperemic. No well-defined mass is seen in the left epididymis.  Hydrocele: There is a moderate hydrocele on the left. There is no appreciable hydrocele on the right.  Varicocele:  None visualized.  Pulsed Doppler interrogation of both testes demonstrates normal low resistance arterial and venous waveforms bilaterally. The peak systolic velocity in the right testis is 15.5 cm/sec with an end-diastolic velocity of 2.0 cm/sec. The peak systolic velocity on the left is 5.9 cm with an end-diastolic velocity of 2.6 cm.  IMPRESSION: Evidence of epididymitis and orchitis on the left. A complex cystic area within the left testis is concerning for an intratesticular abscess. It should be noted that an atypical neoplasm cannot be excluded in this circumstance, and a repeat study after treatment for epididymitis and orchitis is advised to further evaluate this lesion within the left testis. There is a moderate hydrocele on the left as well.  No lesion is identified in the right scrotum. No testicular mass or torsion on the right.   Electronically Signed   By: Lowella Grip III M.D.   On: 02/22/2015 09:21    Principal Problem:   Epididymitis, left Active Problems:   Essential hypertension   Fever   Cellulitis   Orchitis   Testicular abscess   DM type 2 (diabetes mellitus, type 2)   Microcytic anemia   Assessment/Plan 1. Epididymitis, orchitis, possible left testicular abscess with suspected early sepsis although hemodynamics are stable and there is no evidence of end organ injury, lactic acid is normal but increased.  2. Possible complicated UTI with purulent exudate around suprapubic catheter site. No evidence of  cellulitis at this time. Await urology evaluation. Consider further imaging if fails to improve. 3. Prostate cancer, BPH requiring suprapubic catheter placement 01/14/2015 4. Chronic microcytic anemia appears close to baseline. No previous anemia panel on file. 5. Hypertension, blood pressure stable. 6. COPD 7. Coronary artery disease  8. Diabetes mellitus type 2, diet-controlled. Random blood sugar 128. Anion gap 11. 9. Tobacco dependence. Trying to stop. Recommend cessation.    Plan admission to medical floor. Empiric antibiotics for epididymitis and orchitis. Urology consultation pending. Continue to monitor hemodynamics.  Nothing by mouth for now, SCDs.  Check anemia panel. CBC and basic metabolic panel in the morning.  Sliding-scale insulin.  Offer nicotine patch.  Code Status: full code DVT prophylaxis: SCDs Family Communication: discussed with wife and daughter at bedside Disposition Plan/Anticipated LOS: admit 3-4 days  Time spent: 42 minutes  Murray Hodgkins, MD  [facility] Pager [phone] 02/22/2015, 10:22 AM

## 2015-02-22 NOTE — Progress Notes (Signed)
ANTIBIOTIC CONSULT NOTE - INITIAL  Pharmacy Consult for Vancomycin and Zosyn Indication: rule out sepsis  No Known Allergies  Patient Measurements: Height: 6\' 2"  (188 cm) Weight: 156 lb (70.761 kg) IBW/kg (Calculated) : 82.2  Vital Signs: Temp: 100.5 F (38.1 C) (03/04 0853) Temp Source: Oral (03/04 0853) BP: 138/72 mmHg (03/04 0900) Pulse Rate: 79 (03/04 0900) Intake/Output from previous day:   Intake/Output from this shift:    Labs:  Recent Labs  02/22/15 0748  WBC 17.4*  HGB 9.5*  PLT 304  CREATININE 1.02   Estimated Creatinine Clearance: 63.6 mL/min (by C-G formula based on Cr of 1.02). No results for input(s): VANCOTROUGH, VANCOPEAK, VANCORANDOM, GENTTROUGH, GENTPEAK, GENTRANDOM, TOBRATROUGH, TOBRAPEAK, TOBRARND, AMIKACINPEAK, AMIKACINTROU, AMIKACIN in the last 72 hours.   Microbiology: No results found for this or any previous visit (from the past 720 hour(s)).  Medical History: Past Medical History  Diagnosis Date  . Mitral regurgitation   . Glaucoma   . Depression   . Hypertension   . Hyperlipidemia, mixed   . History of syncope     03-22-2014  DX  VAGAL RESPONSE  . Coronary atherosclerosis   . COPD (chronic obstructive pulmonary disease)   . GERD (gastroesophageal reflux disease)   . Diverticulosis of colon   . History of gastritis   . Prostate cancer   . BPH with obstruction/lower urinary tract symptoms   . ED (erectile dysfunction)   . Arthritis     SHOUDLERS, LUMBAR  . 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 History of scabies     2008  . Type 2 diabetes, diet controlled   . Wears glasses   . Full dentures     Medications:  Anti-infectives    None     Assessment: 74 year old male with abdominal pain, fever, and vomiting with suspected urosepsis. Note he has prostate cancer with a suprapubic catheter. He is to begin empiric antibiotic therapy with Vancomycin and Zosyn.  Goal of Therapy:  Vancomycin trough  level 15-20 mcg/ml  Plan:  Zosyn 3.375gm IV x 1 dose over 30 minutes, then 3.375gm IV q8h extended infusion Vancomycin 1gm IV x 1 now, then 750mg  IV q12h Monitor renal function closely Follow available micro data  Legrand Como, Pharm.D., BCPS, AAHIVP Clinical Pharmacist Phone: [phone] or [phone] 02/22/2015, 9:19 AM

## 2015-02-22 NOTE — ED Notes (Signed)
Phlebotomy at bedside drawing labs.

## 2015-02-22 NOTE — Discharge Planning (Signed)
CARE MANAGEMENT NOTE 02/22/2015  Patient:  Russell [name], Russell [name]   Account Number:  [id number]  Date Initiated:  02/22/2015  Documentation initiated by:  [facility]  Subjective/Objective Assessment:   24-yo man with hx of prostate cancer,  BPH requiring suprapubic cath placement  with cryotherapy of the prostate the same time. Presented to ED with pain around suprapubic cath, temp,  emesis.//Hm with spouse.     Action/Plan:      Plan admission to medical floor. Empiric antibiotics for epididymitis and orchitis. Urology consultation pending. Continue to monitor hemodynamics.     NPO for now, SCDs//Access for disposition needs   Anticipated DC Date:  02/25/2015   Anticipated DC Plan:  [place]  CM consult      Choice offered to / List presented to:             Status of service:  In process, will continue to follow Medicare Important Message given?   (If response is "NO", the following Medicare IM given date fields will be blank) Date Medicare IM given:   Medicare IM given by:   Date Additional Medicare IM given:   Additional Medicare IM given by:    Discharge Disposition:    Per UR Regulation:  Reviewed for med. necessity/level of care/duration of stay  If discussed at [place] of Stay Meetings, dates discussed:    Comments:

## 2015-02-22 NOTE — ED Notes (Signed)
PTAR - Patient coming from home with c/o of worsening pain in the lower abdomen around a suprapubic catheter that was placed in February 2016 per the patient.  Vomiting x 2, denies diarrhea.  140 Palpated BP, 98% room air, 10/10 pain, 60 HR.  Hx of bladder cancer.

## 2015-02-22 NOTE — Progress Notes (Signed)
NURSING PROGRESS NOTE  SIDDHANTH DENK [id number] Admission Data: 02/22/2015 4:13 PM Attending Provider: Samuella Cota, MD JKK:XFGH, ERIC, MD Code Status: full   Russell [name] is a 74 y.o. male patient admitted from ED:  -No acute distress noted.  -No complaints of shortness of breath.  -No complaints of chest pain.    Blood pressure 121/45, pulse 68, temperature 101.5 F (38.6 C), temperature source Oral, resp. rate 16, height 6\' 2"  (1.88 m), weight 70.761 kg (156 lb), SpO2 98 %.   IV Fluids:  IV in place, occlusive dsg intact without redness, IV cath antecubital left, condition patent and no redness normal saline @ 125.   Allergies:  Review of patient's allergies indicates no known allergies.  Past Medical History:   has a past medical history of Mitral regurgitation; Glaucoma; Depression; Hypertension; Hyperlipidemia, mixed; History of syncope; Coronary atherosclerosis; COPD (chronic obstructive pulmonary disease); GERD (gastroesophageal reflux disease); Diverticulosis of colon; History of gastritis; Prostate cancer; BPH with obstruction/lower urinary tract symptoms; ED (erectile dysfunction); Arthritis; History of seizure; History of scabies; Type 2 diabetes, diet controlled; Wears glasses; and Full dentures.  Past Surgical History:   has past surgical history that includes Knee arthroscopy (Right, 2008); Capsulotomy (Right, 01/26/2013); Yag laser application (Right, 07/22/9936); I&D extremity (Right, 05/24/2013); Foot surgery (Left); Carpectomy (Right, 03/13/2014); Carpometacarpal (cmc) fusion of thumb (Right, 03/13/2014); Cataract extraction w/ intraocular lens  implant, bilateral (2009); YAG LASER CAPSULOTOMY, LEFT EYE (01-08-2011); Anterior cervical decomp/discectomy fusion (10-16-2009); EXCISIONAL DEBRIDEMENT AND REPAIR RIGHT QUADRICEP TENDON (07-05-2000); Laryngoscopy and esophagoscopy (06-13-2010); EXCISION MASS LEFT FACE , SUBORBITAL AREA, PLASTER RECONSTRUCTION (02-09-2011);  SHOULDER ARTHROSCOPY/ DEBRIDEMENT LABRAL TEAR/  BICEPS TENOTOMY (Left, 09-24-2011); Colonoscopy (08-31-2007); Esophagogastroduodenoscopy (last one 09-11-2008); transthoracic echocardiogram (02-21-2010); Tonsillectomy (as child); Cryoablation (N/A, 01/14/2015); and Insertion of suprapubic catheter (N/A, 01/14/2015).  Social History:   reports that he has been smoking Cigarettes.  He has a 51 pack-year smoking history. He has never used smokeless tobacco. He reports that he does not drink alcohol or use illicit drugs.  Skin: warm, dry, intact   Patient/Family orientated to room. Information packet given to patient/family. Admission inpatient armband information verified with patient/family to include name and date of birth and placed on patient arm. Side rails up x 2, fall assessment and education completed with patient/family. Patient/family able to verbalize understanding of risk associated with falls and verbalized understanding to call for assistance before getting out of bed. Call light within reach. Patient/family able to voice and demonstrate understanding of unit orientation instructions.    Will continue to evaluate and treat per MD orders.

## 2015-02-22 NOTE — ED Notes (Signed)
Dr. Leonides Schanz notified that family was at bedside with patient.

## 2015-02-22 NOTE — ED Notes (Signed)
Family given a coke and  crackers.

## 2015-02-22 NOTE — Plan of Care (Signed)
Problem: Phase I Progression Outcomes Goal: Voiding-avoid urinary catheter unless indicated Outcome: Not Applicable Date Met:  45/62/56 Pt has suprapubic cath

## 2015-02-22 NOTE — ED Provider Notes (Signed)
TIME SEEN: 7:30 AM  CHIEF COMPLAINT: Abdominal pain, fever, vomiting  HPI: Pt is a 74 y.o. male with history of hypertension, hyperlipidemia, COPD, prostate cancer with significant BPH requiring suprapubic catheter placement on 01/14/15 and cryotherapy of the prostate at the same time who presents emergency department with pain around his suprapubic catheter, fever, vomiting that started last night. He is also having pain in both testicles no scrotal masses or swelling. No diarrhea. No chest pain or shortness of breath. No cough. States he has an implant in his left arm that was placed to help take care of his cancer. He has not received any chemotherapy. His urologist is Dr. Gaynelle Arabian. His primary care physician is Dr. Marlou Sa.  ROS: See HPI Constitutional:  fever  Eyes: no drainage  ENT: no runny nose   Cardiovascular:  no chest pain  Resp: no SOB  GI:  vomiting GU: no dysuria Integumentary: no rash  Allergy: no hives  Musculoskeletal: no leg swelling  Neurological: no slurred speech ROS otherwise negative  PAST MEDICAL HISTORY/PAST SURGICAL HISTORY:  Past Medical History  Diagnosis Date  . Mitral regurgitation   . Glaucoma   . Depression   . Hypertension   . Hyperlipidemia, mixed   . History of syncope     03-22-2014  DX  VAGAL RESPONSE  . Coronary atherosclerosis   . COPD (chronic obstructive pulmonary disease)   . GERD (gastroesophageal reflux disease)   . Diverticulosis of colon   . History of gastritis   . Prostate cancer   . BPH with obstruction/lower urinary tract symptoms   . ED (erectile dysfunction)   . Arthritis     SHOUDLERS, LUMBAR  . 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 History of scabies     2008  . Type 2 diabetes, diet controlled   . Wears glasses   . Full dentures     MEDICATIONS:  Prior to Admission medications   Medication Sig Start Date End Date Taking? Authorizing Provider  albuterol (PROVENTIL HFA;VENTOLIN HFA) 108  (90 BASE) MCG/ACT inhaler Inhale 2 puffs into the lungs every 6 (six) hours as needed for wheezing or shortness of breath. 04/10/13   Nishant Dhungel, MD  bimatoprost (LUMIGAN) 0.01 % SOLN Place 1 drop into both eyes at bedtime. 04/10/13   Nishant Dhungel, MD  brimonidine-timolol (COMBIGAN) 0.2-0.5 % ophthalmic solution Place 1 drop into both eyes 2 (two) times daily.     Historical Provider, MD  cephALEXin (KEFLEX) 500 MG capsule Take 1 capsule (500 mg total) by mouth 4 (four) times daily. 02/04/15   Orlie Dakin, MD  dorzolamide (TRUSOPT) 2 % ophthalmic solution Place 1 drop into the left eye 3 (three) times daily.  11/09/13   Historical Provider, MD  esomeprazole (NEXIUM) 40 MG capsule Take 40 mg by mouth every morning.     Historical Provider, MD  losartan (COZAAR) 100 MG tablet Take 1 tablet (100 mg total) by mouth daily. Patient taking differently: Take 100 mg by mouth every morning.  04/10/13   Nishant Dhungel, MD  Multiple Vitamins-Minerals (CENTRUM SILVER ADULT 50+ PO) Take 1 tablet by mouth daily.    Historical Provider, MD  Omega-3 Fatty Acids (FISH OIL) 1000 MG CAPS Take 1 capsule by mouth every morning.     Historical Provider, MD  oxyCODONE-acetaminophen (PERCOCET) 5-325 MG per tablet Take 1-2 tablets by mouth every 6 (six) hours as needed for moderate pain or severe pain.  02/04/15   Orlie Dakin, MD  phenazopyridine (PYRIDIUM) 200 MG tablet Take 1 tablet (200 mg total) by mouth 3 (three) times daily as needed for pain. 01/14/15   Ailene Rud, MD  pravastatin (PRAVACHOL) 80 MG tablet Take 1 tablet (80 mg total) by mouth daily. Patient taking differently: Take 80 mg by mouth every morning.  04/10/13   Nishant Dhungel, MD  tamsulosin (FLOMAX) 0.4 MG CAPS capsule Take 0.4 mg by mouth every morning.  11/19/13   Historical Provider, MD  trimethoprim (TRIMPEX) 100 MG tablet Take 1 tablet (100 mg total) by mouth 1 day or 1 dose. 01/14/15   Ailene Rud, MD    ALLERGIES:  No  Known Allergies  SOCIAL HISTORY:  History  Substance Use Topics  . Smoking status: Current Every Day Smoker -- 1.00 packs/day for 51 years    Types: Cigarettes  . Smokeless tobacco: Never Used  . Alcohol Use: No    FAMILY HISTORY: Family History  Problem Relation Age of Onset  . Diabetes    . Cancer      EXAM: BP 147/69 mmHg  Pulse 81  Temp(Src) 101.2 F (38.4 C) (Oral)  Resp 16  SpO2 97% CONSTITUTIONAL: Alert and oriented and responds appropriately to questions. Well-appearing; well-nourished, elderly, nontoxic but appears uncomfortable HEAD: Normocephalic EYES: Conjunctivae clear, PERRL ENT: normal nose; no rhinorrhea; moist mucous membranes; pharynx without lesions noted NECK: Supple, no meningismus, no LAD  CARD: RRR; S1 and S2 appreciated; no murmurs, no clicks, no rubs, no gallops RESP: Normal chest excursion without splinting or tachypnea; breath sounds clear and equal bilaterally; no wheezes, no rhonchi, no rales, no hypoxia or respiratory distress ABD/GI: Normal bowel sounds; non-distended; soft, tender to palpation over the suprapubic region, there is a small amount of premature drainage coming out of the suprapubic catheter but no surrounding erythema or warmth or fluctuance GU:  Uncircumcised male, no penile discharge, patient has significant bilateral testicular tenderness and he repeatedly pushes my hand away and I'm unable to tell if he has any scrotal masses or testicular masses BACK:  The back appears normal and is non-tender to palpation, there is no CVA tenderness EXT: Normal ROM in all joints; non-tender to palpation; no edema; normal capillary refill; no cyanosis    SKIN: Normal color for age and race; warm, no rash NEURO: Moves all extremities equally PSYCH: The patient's mood and manner are appropriate. Grooming and personal hygiene are appropriate.  MEDICAL DECISION MAKING: Patient here with fever, vomiting, tenderness around his suprapubic catheter,  testicular pain. We'll give pain and nausea medicine, IV fluids. Will obtain blood cultures, lactate, urine and urine culture. We'll obtain testicular ultrasound.  ED PROGRESS: Patient has a leukocytosis of 17.4 with left shift. Chest x-ray clear. Lactate normal. We'll start broad-spectrum antibiotics. Urine pending.   Scrotal US shows - Evidence of epididymitis and orchitis on the left. A complex cystic area within the left testis is concerning for an intratesticular abscess.  Urine shows a nitrite positive urinary tract infection. Culture pending. Has received become ice in and Zosyn. Will consult urology.   9:50 AM  D/w Dr. Karsten Ro with urology. He feels that this is less likely a testicular abscess but if it was he would not recommend drainage. He states patient does have severe epididymitis on review of his ultrasound. He agrees with IV antibiotics and recommends admission to medicine given patient meets sepsis criteria. Patient is still hemodynamically stable. They will see the patient in consult.  10:30 AM  D/w Dr. Sarajane Jews with hospitalist service for admission to medical bed, and patient. Patient hemodynamically stable. Patient and family updated with plan.    CRITICAL CARE Performed by: Nyra Jabs   Total critical care time: 40 minutes - sepsis, UTI, leukocytosis, fever, orchitis, epididymitis  Critical care time was exclusive of separately billable procedures and treating other patients.  Critical care was necessary to treat or prevent imminent or life-threatening deterioration.  Critical care was time spent personally by me on the following activities: development of treatment plan with patient and/or surrogate as well as nursing, discussions with consultants, evaluation of patient's response to treatment, examination of patient, obtaining history from patient or surrogate, ordering and performing treatments and interventions, ordering and review of laboratory studies,  ordering and review of radiographic studies, pulse oximetry and re-evaluation of patient's condition.   [place], DO 02/22/15 1030

## 2015-02-22 NOTE — ED Notes (Signed)
Admitting MD at bedside.

## 2015-02-22 NOTE — ED Notes (Addendum)
Attempted report 

## 2015-02-23 DIAGNOSIS — D509 Iron deficiency anemia, unspecified: Secondary | ICD-10-CM

## 2015-02-23 DIAGNOSIS — B965 Pseudomonas (aeruginosa) (mallei) (pseudomallei) as the cause of diseases classified elsewhere: Secondary | ICD-10-CM

## 2015-02-23 DIAGNOSIS — I10 Essential (primary) hypertension: Secondary | ICD-10-CM

## 2015-02-23 DIAGNOSIS — N390 Urinary tract infection, site not specified: Secondary | ICD-10-CM

## 2015-02-23 LAB — CBC
HEMATOCRIT: 26.9 % — AB (ref 39.0–52.0)
Hemoglobin: 8.7 g/dL — ABNORMAL LOW (ref 13.0–17.0)
MCH: 24.3 pg — ABNORMAL LOW (ref 26.0–34.0)
MCHC: 32.3 g/dL (ref 30.0–36.0)
MCV: 75.1 fL — ABNORMAL LOW (ref 78.0–100.0)
PLATELETS: 296 10*3/uL (ref 150–400)
RBC: 3.58 MIL/uL — ABNORMAL LOW (ref 4.22–5.81)
RDW: 18.5 % — ABNORMAL HIGH (ref 11.5–15.5)
WBC: 22.3 10*3/uL — ABNORMAL HIGH (ref 4.0–10.5)

## 2015-02-23 LAB — COMPREHENSIVE METABOLIC PANEL
ALBUMIN: 2.8 g/dL — AB (ref 3.5–5.2)
ALT: 11 U/L (ref 0–53)
AST: 11 U/L (ref 0–37)
Alkaline Phosphatase: 55 U/L (ref 39–117)
Anion gap: 8 (ref 5–15)
BUN: 14 mg/dL (ref 6–23)
CALCIUM: 8.6 mg/dL (ref 8.4–10.5)
CO2: 25 mmol/L (ref 19–32)
Chloride: 104 mmol/L (ref 96–112)
Creatinine, Ser: 0.95 mg/dL (ref 0.50–1.35)
GFR calc Af Amer: >90 mL/min (ref >90)
GFR, EST NON AFRICAN AMERICAN: 80 mL/min — AB (ref >90)
GLUCOSE: 122 mg/dL — AB (ref 70–99)
POTASSIUM: 3.7 mmol/L (ref 3.5–5.1)
SODIUM: 137 mmol/L (ref 135–145)
Total Bilirubin: 0.7 mg/dL (ref 0.3–1.2)
Total Protein: 5.7 g/dL — ABNORMAL LOW (ref 6.0–8.3)

## 2015-02-23 LAB — GLUCOSE, CAPILLARY: Glucose-Capillary: 125 mg/dL — ABNORMAL HIGH (ref 70–99)

## 2015-02-23 MED ORDER — INSULIN ASPART 100 UNIT/ML ~~LOC~~ SOLN: 0.0000 [IU] | Freq: Three times a day (TID) | SUBCUTANEOUS | Status: DC

## 2015-02-23 MED ORDER — INSULIN ASPART 100 UNIT/ML ~~LOC~~ SOLN: 0.0000 [IU] | Freq: Every day | SUBCUTANEOUS | Status: DC

## 2015-02-23 NOTE — Progress Notes (Signed)
Patient ID: Russell [name], male   DOB: 1941/12/11, 74 y.o.   MRN: [id number]  Subjective: The patient reports continued left testicular pain. He does not feel it is significantly improved but also does not note it has worsened either.  Objective: Vital signs in last 24 hours: Temp:  [98.4 F (36.9 C)-101.5 F (38.6 C)] 100.5 F (38.1 C) (03/05 0505) Pulse Rate:  [57-79] 57 (03/05 0505) Resp:  [14-18] 18 (03/05 0505) BP: (105-191)/(41-164) 123/53 mmHg (03/05 0505) SpO2:  [92 %-100 %] 96 % (03/05 0505)A  Intake/Output from previous day: 03/04 0701 - 03/05 0700 In: 2139.6 [I.V.:1839.6; IV Piggyback:300] Out: -  Intake/Output this shift:    Past Medical History  Diagnosis Date  . Mitral regurgitation   . Glaucoma   . Depression   . Hypertension   . Hyperlipidemia, mixed   . History of syncope     03-22-2014  DX  VAGAL RESPONSE  . Coronary atherosclerosis   . COPD (chronic obstructive pulmonary disease)   . GERD (gastroesophageal reflux disease)   . Diverticulosis of colon   . History of gastritis   . Prostate cancer   . BPH with obstruction/lower urinary tract symptoms   . ED (erectile dysfunction)   . Arthritis     SHOUDLERS, LUMBAR  . 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 History of scabies     2008  . Type 2 diabetes, diet controlled   . Wears glasses   . Full dentures     ROS: negative other than as above.   Physical Exam:  Lungs - Normal respiratory effort, chest expands symmetrically.  Abdomen - Soft, non-tender & non-distended.  GU -  The scrotum reveals no erythema of the skin. His epididymis is exquisitely tender but there was no evidence of epididymal abscess noted on his scrotal ultrasound. The testicle itself seems to be tender but less then the epididymis without significant swelling of the testicle itself.  Lab Results:  Recent Labs  02/22/15 0748 02/23/15 0440  WBC 17.4* 22.3*  HGB 9.5* 8.7*  HCT 29.6* 26.9*    BMET  Recent Labs  02/22/15 0748 02/23/15 0440  NA 139 137  K 3.9 3.7  CL 103 104  CO2 25 25  GLUCOSE 128* 122*  BUN 16 14  CREATININE 1.02 0.95  CALCIUM 9.2 8.6   No results for input(s): LABURIN in the last 72 hours. Results for orders placed or performed during the hospital encounter of 02/22/15  Blood culture (routine x 2)     Status: None (Preliminary result)   Collection Time: 02/22/15  7:49 AM  Result Value Ref Range Status   Specimen Description BLOOD LEFT ANTECUBITAL  Final   Special Requests BOTTLES DRAWN AEROBIC AND ANAEROBIC 5CCS  Final   Culture   Final           BLOOD CULTURE RECEIVED NO GROWTH TO DATE CULTURE WILL BE HELD FOR 5 DAYS BEFORE ISSUING A FINAL NEGATIVE REPORT Performed at [facility]    Report Status PENDING  Incomplete  Blood culture (routine x 2)     Status: None (Preliminary result)   Collection Time: 02/22/15  7:53 AM  Result Value Ref Range Status   Specimen Description BLOOD RIGHT HAND  Final   Special Requests BOTTLES DRAWN AEROBIC AND ANAEROBIC 5CCS  Final   Culture   Final           BLOOD  CULTURE RECEIVED NO GROWTH TO DATE CULTURE WILL BE HELD FOR 5 DAYS BEFORE ISSUING A FINAL NEGATIVE REPORT Performed at [facility]    Report Status PENDING  Incomplete    Studies/Results: Dg Chest 2 View  02/22/2015   CLINICAL DATA:  One day history of cough and fever  EXAM: CHEST  2 VIEW  COMPARISON:  December 19, 2014  FINDINGS: There is no edema or consolidation. Heart size and pulmonary vascularity are normal. No adenopathy. End plate concavity is noted in several lower thoracic vertebral bodies consistent with osteoporotic change. There is postoperative change in the lower cervical spine region.  IMPRESSION: No edema or consolidation.  Evidence of osteoporosis.   Electronically Signed   By: Lowella Grip III M.D.   On: 02/22/2015 08:27   US Scrotum  02/22/2015   CLINICAL DATA:  Scrotal pain, more severe on the left than on  the right  EXAM: SCROTAL ULTRASOUND  DOPPLER ULTRASOUND OF THE TESTICLES  TECHNIQUE: Complete ultrasound examination of the testicles, epididymis, and other scrotal structures was performed. Color and spectral Doppler ultrasound were also utilized to evaluate blood flow to the testicles.  COMPARISON:  None.  FINDINGS: Right testicle  Measurements: 4.2 x 1.7 x 2.8 cm. No mass or microlithiasis visualized.  Left testicle  Measurements: 3.2 x 1.8 x 2.3 cm. No microlithiasis visualized. Within the left testis there is a complex cystic appearing area measuring 1.8 x 0.8 x 1.4 cm. This area is does not show appreciable color flow. There is extensive color flow elsewhere throughout the left testis.  Right epididymis:  Normal in size and appearance.  Left epididymis: The left epididymis is diffusely enlarged and hyperemic. No well-defined mass is seen in the left epididymis.  Hydrocele: There is a moderate hydrocele on the left. There is no appreciable hydrocele on the right.  Varicocele:  None visualized.  Pulsed Doppler interrogation of both testes demonstrates normal low resistance arterial and venous waveforms bilaterally. The peak systolic velocity in the right testis is 15.5 cm/sec with an end-diastolic velocity of 2.0 cm/sec. The peak systolic velocity on the left is 5.9 cm with an end-diastolic velocity of 2.6 cm.  IMPRESSION: Evidence of epididymitis and orchitis on the left. A complex cystic area within the left testis is concerning for an intratesticular abscess. It should be noted that an atypical neoplasm cannot be excluded in this circumstance, and a repeat study after treatment for epididymitis and orchitis is advised to further evaluate this lesion within the left testis. There is a moderate hydrocele on the left as well.  No lesion is identified in the right scrotum. No testicular mass or torsion on the right.   Electronically Signed   By: Lowella Grip III M.D.   On: 02/22/2015 09:21   [place] Art/ven  Flow Abd Pelv Doppler  02/22/2015   CLINICAL DATA:  Scrotal pain, more severe on the left than on the right  EXAM: SCROTAL ULTRASOUND  DOPPLER ULTRASOUND OF THE TESTICLES  TECHNIQUE: Complete ultrasound examination of the testicles, epididymis, and other scrotal structures was performed. Color and spectral Doppler ultrasound were also utilized to evaluate blood flow to the testicles.  COMPARISON:  None.  FINDINGS: Right testicle  Measurements: 4.2 x 1.7 x 2.8 cm. No mass or microlithiasis visualized.  Left testicle  Measurements: 3.2 x 1.8 x 2.3 cm. No microlithiasis visualized. Within the left testis there is a complex cystic appearing area measuring 1.8 x 0.8 x 1.4 cm. This area is does  not show appreciable color flow. There is extensive color flow elsewhere throughout the left testis.  Right epididymis:  Normal in size and appearance.  Left epididymis: The left epididymis is diffusely enlarged and hyperemic. No well-defined mass is seen in the left epididymis.  Hydrocele: There is a moderate hydrocele on the left. There is no appreciable hydrocele on the right.  Varicocele:  None visualized.  Pulsed Doppler interrogation of both testes demonstrates normal low resistance arterial and venous waveforms bilaterally. The peak systolic velocity in the right testis is 15.5 cm/sec with an end-diastolic velocity of 2.0 cm/sec. The peak systolic velocity on the left is 5.9 cm with an end-diastolic velocity of 2.6 cm.  IMPRESSION: Evidence of epididymitis and orchitis on the left. A complex cystic area within the left testis is concerning for an intratesticular abscess. It should be noted that an atypical neoplasm cannot be excluded in this circumstance, and a repeat study after treatment for epididymitis and orchitis is advised to further evaluate this lesion within the left testis. There is a moderate hydrocele on the left as well.  No lesion is identified in the right scrotum. No testicular mass or torsion on the right.    Electronically Signed   By: Lowella Grip III M.D.   On: 02/22/2015 09:21    Assessment: Clinically he undoubtedly has a severe left epididymitis. Although his testicle is tender as one would expect it is not enlarged for indurated as I would expect if he had an intratesticular abscess present. A nonsterile intratesticular abscess has not been ruled out however and he continues to have pain and he is white blood cell count has increased. He is on broad-spectrum antibiotics and a urine culture has been obtained but remains pending at this time. I have discussed with the patient the fact that I would recommend observation for 24 hours more and if he does not begin to note improvement we would consider surgery which would necessitate a left orchiectomy. I discussed this with Dr. Wyline Copas today and feel he can eat but we'll make him nothing by mouth after midnight tonight.  Plan:  1. Allow regular diet today. 2. Recheck CBC in a.m. 3. Nothing by mouth after midnight. Claybon Jabs 02/23/2015, 9:33 AM

## 2015-02-23 NOTE — Progress Notes (Signed)
[facility] PROGRESS NOTE  Russell [name] GYK:[id number] DOB: [birth year] DOA: 02/22/2015 PCP: Kevan Ny, MD  Assessment/Plan: 1. Epididymitis with sepsis 1. Pt evaluated by Urology. Appreciate input 2. Testicle remains quite tender 3. Leukocytosis has worsened today with WBC of over 22k 4. Have discussed case with Urology. Plans to continue current broad spectrum abx and if no improvement by tomorrow, there is consideration for orchiectomy 5. Pt NPO after midnight tonight 2. Possible complicated UTI 1. Pt is continued on broad spectrum abx 2. Urine cx with >100,000 pseudomonas 3. Consider changing out suprapubic catheter. Will discuss with Urology 3. Prostate cancer 1. S/p suprapubic catheter placement 4. Chronic microcytic anemia  1. Follow CBC 5. HTN 1. BP stable 2. Cont current regimen 6. COPD 1. No wheezing on exam 2. On min o2 support 7. CAD 1. Stable 2. Denies chest pain or sob 8. DM2 1. Diet controlled prior to admit 2. Last documented a1c of 6.1 in 2013 3. Will repeat a1c in AM 4. Add SSI coverage 9. Tobacco abuse 1. Cessation was done 10. DVT prophylaxis 1. SCD's  Code Status: Full Family Communication: Pt in room (indicate person spoken with, relationship, and if by phone, the number) Disposition Plan: Pending   Consultants:  Urology  Procedures:    Antibiotics:  Vancymycin 3/4>>>  Zosyn 3/4>>>  HPI/Subjective: Reports continued scrotal pain, largely unchanged from yesterday  Objective: Filed Vitals:   02/22/15 2056 02/23/15 0241 02/23/15 0505 02/23/15 1500  BP: 131/55  123/53 135/64  Pulse: 64  57 70  Temp:  98.4 F (36.9 C) 100.5 F (38.1 C) 99.3 F (37.4 C)  TempSrc:  Oral Oral Oral  Resp:   18 18  Height:      Weight:      SpO2: 97%  96% 97%    Intake/Output Summary (Last 24 hours) at 02/23/15 1738 Last data filed at 02/23/15 1604  Gross per 24 hour  Intake    680 ml  Output      0 ml  Net    680 ml   Filed  Weights   02/22/15 0739  Weight: 70.761 kg (156 lb)    Exam:   General:  Awake, in nad  Cardiovascular: regular, s1, s2  Respiratory: normal resp effort, no wheezing  Abdomen: soft,nondistended  Musculoskeletal: perfused, no clubbing   GU: Marked scrotal tenderness on palpation  Data Reviewed: Basic Metabolic Panel:  Recent Labs Lab 02/22/15 0748 02/23/15 0440  NA 139 137  K 3.9 3.7  CL 103 104  CO2 25 25  GLUCOSE 128* 122*  BUN 16 14  CREATININE 1.02 0.95  CALCIUM 9.2 8.6   Liver Function Tests:  Recent Labs Lab 02/22/15 0748 02/23/15 0440  AST 13 11  ALT 17 11  ALKPHOS 68 55  BILITOT 0.5 0.7  PROT 6.7 5.7*  ALBUMIN 3.6 2.8*   No results for input(s): LIPASE, AMYLASE in the last 168 hours. No results for input(s): AMMONIA in the last 168 hours. CBC:  Recent Labs Lab 02/22/15 0748 02/23/15 0440  WBC 17.4* 22.3*  NEUTROABS 15.2*  --   HGB 9.5* 8.7*  HCT 29.6* 26.9*  MCV 76.1* 75.1*  PLT 304 296   Cardiac Enzymes: No results for input(s): CKTOTAL, CKMB, CKMBINDEX, TROPONINI in the last 168 hours. BNP (last 3 results) No results for input(s): BNP in the last 8760 hours.  ProBNP (last 3 results) No results for input(s): PROBNP in the last 8760 hours.  CBG: No results for  input(s): GLUCAP in the last 168 hours.  Recent Results (from the past 240 hour(s))  Blood culture (routine x 2)     Status: None (Preliminary result)   Collection Time: 02/22/15  7:49 AM  Result Value Ref Range Status   Specimen Description BLOOD LEFT ANTECUBITAL  Final   Special Requests BOTTLES DRAWN AEROBIC AND ANAEROBIC 5CCS  Final   Culture   Final           BLOOD CULTURE RECEIVED NO GROWTH TO DATE CULTURE WILL BE HELD FOR 5 DAYS BEFORE ISSUING A FINAL NEGATIVE REPORT Performed at [facility]    Report Status PENDING  Incomplete  Blood culture (routine x 2)     Status: None (Preliminary result)   Collection Time: 02/22/15  7:53 AM  Result Value Ref  Range Status   Specimen Description BLOOD RIGHT HAND  Final   Special Requests BOTTLES DRAWN AEROBIC AND ANAEROBIC 5CCS  Final   Culture   Final           BLOOD CULTURE RECEIVED NO GROWTH TO DATE CULTURE WILL BE HELD FOR 5 DAYS BEFORE ISSUING A FINAL NEGATIVE REPORT Performed at [facility]    Report Status PENDING  Incomplete  Urine culture     Status: None (Preliminary result)   Collection Time: 02/22/15  9:05 AM  Result Value Ref Range Status   Specimen Description URINE, RANDOM  Final   Special Requests NONE  Final   Colony Count   Final    >=100,000 COLONIES/ML Performed at [facility]    Culture   Final    PSEUDOMONAS AERUGINOSA Performed at [facility]    Report Status PENDING  Incomplete     Studies: Dg Chest 2 View  02/22/2015   CLINICAL DATA:  One day history of cough and fever  EXAM: CHEST  2 VIEW  COMPARISON:  December 19, 2014  FINDINGS: There is no edema or consolidation. Heart size and pulmonary vascularity are normal. No adenopathy. End plate concavity is noted in several lower thoracic vertebral bodies consistent with osteoporotic change. There is postoperative change in the lower cervical spine region.  IMPRESSION: No edema or consolidation.  Evidence of osteoporosis.   Electronically Signed   By: Lowella Grip III M.D.   On: 02/22/2015 08:27   US Scrotum  02/22/2015   CLINICAL DATA:  Scrotal pain, more severe on the left than on the right  EXAM: SCROTAL ULTRASOUND  DOPPLER ULTRASOUND OF THE TESTICLES  TECHNIQUE: Complete ultrasound examination of the testicles, epididymis, and other scrotal structures was performed. Color and spectral Doppler ultrasound were also utilized to evaluate blood flow to the testicles.  COMPARISON:  None.  FINDINGS: Right testicle  Measurements: 4.2 x 1.7 x 2.8 cm. No mass or microlithiasis visualized.  Left testicle  Measurements: 3.2 x 1.8 x 2.3 cm. No microlithiasis visualized. Within the left testis there is a  complex cystic appearing area measuring 1.8 x 0.8 x 1.4 cm. This area is does not show appreciable color flow. There is extensive color flow elsewhere throughout the left testis.  Right epididymis:  Normal in size and appearance.  Left epididymis: The left epididymis is diffusely enlarged and hyperemic. No well-defined mass is seen in the left epididymis.  Hydrocele: There is a moderate hydrocele on the left. There is no appreciable hydrocele on the right.  Varicocele:  None visualized.  Pulsed Doppler interrogation of both testes demonstrates normal low resistance arterial and venous waveforms  bilaterally. The peak systolic velocity in the right testis is 15.5 cm/sec with an end-diastolic velocity of 2.0 cm/sec. The peak systolic velocity on the left is 5.9 cm with an end-diastolic velocity of 2.6 cm.  IMPRESSION: Evidence of epididymitis and orchitis on the left. A complex cystic area within the left testis is concerning for an intratesticular abscess. It should be noted that an atypical neoplasm cannot be excluded in this circumstance, and a repeat study after treatment for epididymitis and orchitis is advised to further evaluate this lesion within the left testis. There is a moderate hydrocele on the left as well.  No lesion is identified in the right scrotum. No testicular mass or torsion on the right.   Electronically Signed   By: Lowella Grip III M.D.   On: 02/22/2015 09:21   [place] Art/ven Flow Abd Pelv Doppler  02/22/2015   CLINICAL DATA:  Scrotal pain, more severe on the left than on the right  EXAM: SCROTAL ULTRASOUND  DOPPLER ULTRASOUND OF THE TESTICLES  TECHNIQUE: Complete ultrasound examination of the testicles, epididymis, and other scrotal structures was performed. Color and spectral Doppler ultrasound were also utilized to evaluate blood flow to the testicles.  COMPARISON:  None.  FINDINGS: Right testicle  Measurements: 4.2 x 1.7 x 2.8 cm. No mass or microlithiasis visualized.  Left testicle   Measurements: 3.2 x 1.8 x 2.3 cm. No microlithiasis visualized. Within the left testis there is a complex cystic appearing area measuring 1.8 x 0.8 x 1.4 cm. This area is does not show appreciable color flow. There is extensive color flow elsewhere throughout the left testis.  Right epididymis:  Normal in size and appearance.  Left epididymis: The left epididymis is diffusely enlarged and hyperemic. No well-defined mass is seen in the left epididymis.  Hydrocele: There is a moderate hydrocele on the left. There is no appreciable hydrocele on the right.  Varicocele:  None visualized.  Pulsed Doppler interrogation of both testes demonstrates normal low resistance arterial and venous waveforms bilaterally. The peak systolic velocity in the right testis is 15.5 cm/sec with an end-diastolic velocity of 2.0 cm/sec. The peak systolic velocity on the left is 5.9 cm with an end-diastolic velocity of 2.6 cm.  IMPRESSION: Evidence of epididymitis and orchitis on the left. A complex cystic area within the left testis is concerning for an intratesticular abscess. It should be noted that an atypical neoplasm cannot be excluded in this circumstance, and a repeat study after treatment for epididymitis and orchitis is advised to further evaluate this lesion within the left testis. There is a moderate hydrocele on the left as well.  No lesion is identified in the right scrotum. No testicular mass or torsion on the right.   Electronically Signed   By: Lowella Grip III M.D.   On: 02/22/2015 09:21    Scheduled Meds: . brimonidine  1 drop Both Eyes BID  . dorzolamide  1 drop Left Eye TID  . latanoprost  1 drop Both Eyes QHS  . nicotine  14 mg Transdermal Daily  . pantoprazole  40 mg Oral Daily  . piperacillin-tazobactam (ZOSYN)  IV  3.375 g Intravenous Q8H  . tamsulosin  0.4 mg Oral q morning - 10a  . timolol  1 drop Both Eyes BID  . vancomycin  750 mg Intravenous Q12H   Continuous Infusions: . sodium chloride 125  mL/hr at 02/22/15 1043  . sodium chloride 1,000 mL (02/23/15 1143)    Principal Problem:   Epididymitis, left  Active Problems:   Essential hypertension   Fever   Cellulitis   Orchitis   Testicular abscess   DM type 2 (diabetes mellitus, type 2)   Microcytic anemia   Epididymitis   UTI (lower urinary tract infection)    CHIU, STEPHEN K  [facility] Pager [phone]. If 7PM-7AM, please contact night-coverage at [email or web address], password [facility] 02/23/2015, 5:38 PM  LOS: 1 day

## 2015-02-24 ENCOUNTER — Inpatient Hospital Stay (HOSPITAL_COMMUNITY): Payer: Medicare Other | Admitting: Anesthesiology

## 2015-02-24 ENCOUNTER — Encounter (HOSPITAL_COMMUNITY): Payer: Self-pay | Admitting: Critical Care Medicine

## 2015-02-24 ENCOUNTER — Encounter (HOSPITAL_COMMUNITY)
Admission: EM | Disposition: A | Discharge status: Home / Self Care | Payer: Self-pay | Source: Home / Self Care | Attending: Internal Medicine

## 2015-02-24 DIAGNOSIS — E11349 Type 2 diabetes mellitus with severe nonproliferative diabetic retinopathy without macular edema: Secondary | ICD-10-CM

## 2015-02-24 HISTORY — PX: ORCHIECTOMY: SHX2116

## 2015-02-24 LAB — COMPREHENSIVE METABOLIC PANEL
ALT: 11 U/L (ref 0–53)
ANION GAP: 10 (ref 5–15)
AST: 11 U/L (ref 0–37)
Albumin: 2.7 g/dL — ABNORMAL LOW (ref 3.5–5.2)
Alkaline Phosphatase: 77 U/L (ref 39–117)
BILIRUBIN TOTAL: 0.6 mg/dL (ref 0.3–1.2)
BUN: 12 mg/dL (ref 6–23)
CHLORIDE: 105 mmol/L (ref 96–112)
CO2: 23 mmol/L (ref 19–32)
Calcium: 8.5 mg/dL (ref 8.4–10.5)
Creatinine, Ser: 1.02 mg/dL (ref 0.50–1.35)
GFR calc Af Amer: 82 mL/min — ABNORMAL LOW (ref >90)
GFR calc non Af Amer: 70 mL/min — ABNORMAL LOW (ref >90)
Glucose, Bld: 97 mg/dL (ref 70–99)
Potassium: 3.6 mmol/L (ref 3.5–5.1)
SODIUM: 138 mmol/L (ref 135–145)
Total Protein: 5.9 g/dL — ABNORMAL LOW (ref 6.0–8.3)

## 2015-02-24 LAB — CBC WITH DIFFERENTIAL/PLATELET
Basophils Absolute: 0.1 10*3/uL (ref 0.0–0.1)
Basophils Relative: 0 % (ref 0–1)
EOS PCT: 0 % (ref 0–5)
Eosinophils Absolute: 0.1 10*3/uL (ref 0.0–0.7)
HCT: 25.8 % — ABNORMAL LOW (ref 39.0–52.0)
HEMOGLOBIN: 8.3 g/dL — AB (ref 13.0–17.0)
Lymphocytes Relative: 9 % — ABNORMAL LOW (ref 12–46)
Lymphs Abs: 1.8 10*3/uL (ref 0.7–4.0)
MCH: 24.4 pg — ABNORMAL LOW (ref 26.0–34.0)
MCHC: 32.2 g/dL (ref 30.0–36.0)
MCV: 75.9 fL — ABNORMAL LOW (ref 78.0–100.0)
MONO ABS: 2 10*3/uL — AB (ref 0.1–1.0)
Monocytes Relative: 10 % (ref 3–12)
Neutro Abs: 16.9 10*3/uL — ABNORMAL HIGH (ref 1.7–7.7)
Neutrophils Relative %: 81 % — ABNORMAL HIGH (ref 43–77)
Platelets: 282 10*3/uL (ref 150–400)
RBC: 3.4 MIL/uL — AB (ref 4.22–5.81)
RDW: 18.5 % — ABNORMAL HIGH (ref 11.5–15.5)
WBC: 20.8 10*3/uL — ABNORMAL HIGH (ref 4.0–10.5)

## 2015-02-24 LAB — URINE CULTURE: Colony Count: >=100000

## 2015-02-24 LAB — GLUCOSE, CAPILLARY
GLUCOSE-CAPILLARY: 110 mg/dL — AB (ref 70–99)
Glucose-Capillary: 104 mg/dL — ABNORMAL HIGH (ref 70–99)
Glucose-Capillary: 110 mg/dL — ABNORMAL HIGH (ref 70–99)

## 2015-02-24 SURGERY — ORCHIECTOMY
Anesthesia: General | Laterality: Left

## 2015-02-24 MED ORDER — PROMETHAZINE HCL 25 MG/ML IJ SOLN: 6.2500 mg | INTRAMUSCULAR | Status: DC | PRN

## 2015-02-24 MED ORDER — PROPOFOL 10 MG/ML IV BOLUS
INTRAVENOUS | Status: DC | PRN
  Administered 2015-02-24: 20 mg via INTRAVENOUS
  Administered 2015-02-24: 50 mg via INTRAVENOUS
  Administered 2015-02-24: 100 mg via INTRAVENOUS
  Administered 2015-02-24: 30 mg via INTRAVENOUS

## 2015-02-24 MED ORDER — FENTANYL CITRATE 0.05 MG/ML IJ SOLN
INTRAMUSCULAR | Status: AC
  Filled 2015-02-24: qty 5

## 2015-02-24 MED ORDER — FENTANYL CITRATE 0.05 MG/ML IJ SOLN: 25.0000 ug | INTRAMUSCULAR | Status: DC | PRN

## 2015-02-24 MED ORDER — ONDANSETRON HCL 4 MG/2ML IJ SOLN
INTRAMUSCULAR | Status: AC
  Filled 2015-02-24: qty 2

## 2015-02-24 MED ORDER — EPHEDRINE SULFATE 50 MG/ML IJ SOLN
INTRAMUSCULAR | Status: DC | PRN
Start: 2015-02-24 — End: 2015-02-24
  Administered 2015-02-24 (×6): 2.5 mg via INTRAVENOUS

## 2015-02-24 MED ORDER — MIDAZOLAM HCL 2 MG/2ML IJ SOLN: 0.5000 mg | Freq: Once | INTRAMUSCULAR | Status: AC | PRN

## 2015-02-24 MED ORDER — 0.9 % SODIUM CHLORIDE (POUR BTL) OPTIME
TOPICAL | Status: DC | PRN
  Administered 2015-02-24: 200 mL

## 2015-02-24 MED ORDER — BACITRACIN-NEOMYCIN-POLYMYXIN 400-5-5000 EX OINT
TOPICAL_OINTMENT | CUTANEOUS | Status: AC
  Filled 2015-02-24: qty 1

## 2015-02-24 MED ORDER — MEPERIDINE HCL 25 MG/ML IJ SOLN: 6.2500 mg | INTRAMUSCULAR | Status: DC | PRN

## 2015-02-24 MED ORDER — ROCURONIUM BROMIDE 50 MG/5ML IV SOLN
INTRAVENOUS | Status: AC
  Filled 2015-02-24: qty 1

## 2015-02-24 MED ORDER — FENTANYL CITRATE 0.05 MG/ML IJ SOLN
INTRAMUSCULAR | Status: DC | PRN
  Administered 2015-02-24 (×3): 25 ug via INTRAVENOUS
  Administered 2015-02-24: 100 ug via INTRAVENOUS

## 2015-02-24 MED ORDER — LACTATED RINGERS IV SOLN
INTRAVENOUS | Status: DC | PRN
  Administered 2015-02-24: 07:00:00 via INTRAVENOUS

## 2015-02-24 MED ORDER — ONDANSETRON HCL 4 MG/2ML IJ SOLN
INTRAMUSCULAR | Status: DC | PRN
Start: 2015-02-24 — End: 2015-02-24
  Administered 2015-02-24: 4 mg via INTRAVENOUS

## 2015-02-24 MED ORDER — PHENYLEPHRINE 40 MCG/ML (10ML) SYRINGE FOR IV PUSH (FOR BLOOD PRESSURE SUPPORT)
PREFILLED_SYRINGE | INTRAVENOUS | Status: AC
  Filled 2015-02-24: qty 10

## 2015-02-24 MED ORDER — LIDOCAINE HCL (CARDIAC) 10 MG/ML IV SOLN
INTRAVENOUS | Status: DC | PRN
  Administered 2015-02-24: 20 mg via INTRAVENOUS

## 2015-02-24 MED ORDER — PROPOFOL 10 MG/ML IV BOLUS
INTRAVENOUS | Status: AC
  Filled 2015-02-24: qty 20

## 2015-02-24 SURGICAL SUPPLY — 28 items
BAG URO CATCHER STRL LF (DRAPE) IMPLANT
BLADE 10 SAFETY STRL DISP (BLADE) ×2 IMPLANT
BLADE SURG 15 STRL LF DISP TIS (BLADE) ×2 IMPLANT
BLADE SURG 15 STRL SS (BLADE)
CANISTER SUCTION 2500CC (MISCELLANEOUS) ×2 IMPLANT
CONT SPEC 4OZ CLIKSEAL STRL BL (MISCELLANEOUS) ×2 IMPLANT
DRSG PAD ABDOMINAL 8X10 ST (GAUZE/BANDAGES/DRESSINGS) ×4 IMPLANT
ELECT REM PT RETURN 9FT ADLT (ELECTROSURGICAL) ×2
ELECTRODE REM PT RTRN 9FT ADLT (ELECTROSURGICAL) ×1 IMPLANT
GAUZE SPONGE 4X4 12PLY STRL (GAUZE/BANDAGES/DRESSINGS) ×2 IMPLANT
GLOVE BIO SURGEON STRL SZ8 (GLOVE) ×2 IMPLANT
KIT BASIN OR (CUSTOM PROCEDURE TRAY) ×2 IMPLANT
KIT ROOM TURNOVER OR (KITS) ×2 IMPLANT
NS IRRIG 1000ML POUR BTL (IV SOLUTION) ×2 IMPLANT
PACK CYSTO (CUSTOM PROCEDURE TRAY) ×1 IMPLANT
PACK GENERAL/GYN (CUSTOM PROCEDURE TRAY) ×2 IMPLANT
PAD ARMBOARD 7.5X6 YLW CONV (MISCELLANEOUS) ×4 IMPLANT
SOLUTION BETADINE 4OZ (MISCELLANEOUS) ×2 IMPLANT
SPONGE GAUZE 4X4 12PLY STER LF (GAUZE/BANDAGES/DRESSINGS) ×1 IMPLANT
SPONGE LAP 18X18 X RAY DECT (DISPOSABLE) ×1 IMPLANT
SUT CHROMIC 3 0 SH 27 (SUTURE) ×1 IMPLANT
SUT SILK  1 MH (SUTURE) ×1
SUT SILK 1 MH (SUTURE) IMPLANT
SUT SILK 2 0 TIES 10X30 (SUTURE) ×1 IMPLANT
SWAB COLLECTION DEVICE MRSA (MISCELLANEOUS) ×2 IMPLANT
TOWEL OR 17X24 6PK STRL BLUE (TOWEL DISPOSABLE) ×2 IMPLANT
TOWEL OR 17X26 10 PK STRL BLUE (TOWEL DISPOSABLE) ×2 IMPLANT
WATER STERILE IRR 1000ML POUR (IV SOLUTION) ×1 IMPLANT

## 2015-02-24 NOTE — Transfer of Care (Signed)
Immediate Anesthesia Transfer of Care Note  Patient: Russell [name]  Procedure(s) Performed: Procedure(s): SCROTAL EXPLORATION WITH LEFT ORCHIECTOMY (Left)  Patient Location: PACU  Anesthesia Type:General  Level of Consciousness: awake, alert  and oriented  Airway & Oxygen Therapy: Patient Spontanous Breathing and Patient connected to nasal cannula oxygen  Post-op Assessment: Report given to RN, Post -op Vital signs reviewed and stable and Patient moving all extremities X 4  Post vital signs: Reviewed and stable  Last Vitals:  Filed Vitals:   02/24/15 0559  BP: 115/55  Pulse:   Temp: 37.7 C  Resp: 18  HR 82, Sats 100%, R 20, BP 528/41  Complications: No apparent anesthesia complications

## 2015-02-24 NOTE — Progress Notes (Signed)
Ptient back from OR. Alert and oriented, no acute distress noted, no complaints. Dressing on scrotum area intact, dry, clean. Will continue to monitor.

## 2015-02-24 NOTE — Anesthesia Postprocedure Evaluation (Signed)
  Anesthesia Post-op Note  Patient: Russell [name]  Procedure(s) Performed: Procedure(s): SCROTAL EXPLORATION WITH LEFT ORCHIECTOMY (Left)  Patient Location: PACU  Anesthesia Type:General  Level of Consciousness: awake, alert , oriented and patient cooperative  Airway and Oxygen Therapy: Patient Spontanous Breathing and Patient connected to nasal cannula oxygen  Post-op Pain: none  Post-op Assessment: Post-op Vital signs reviewed, Patient's Cardiovascular Status Stable, Respiratory Function Stable, Patent Airway, No signs of Nausea or vomiting and Pain level controlled  Post-op Vital Signs: Reviewed and stable  Last Vitals:  Filed Vitals:   02/24/15 0902  BP: 129/53  Pulse: 66  Temp:   Resp: 15    Complications: No apparent anesthesia complications

## 2015-02-24 NOTE — Progress Notes (Signed)
[facility] PROGRESS NOTE  Russell [name]:[id number] DOB: 03/01/1941 DOA: 02/22/2015 PCP: Kevan Ny, MD  Assessment/Plan: 1. Epididymitis with sepsis 1. Pt evaluated by Urology. Appreciate input 2. Testicle remained tender this AM 3. Leukocytosis remains elevated at 20k from 22k yesterday 4. Pt is now s/p surgery with orchiectomy on 3/6 5. Pt NPO after midnight tonight 2. Complicated pseudomonas UTI 1. Pt is continued on broad spectrum abx 2. Urine cx with >100,000 pseudomonas 3. Consider changing out suprapubic catheter. Defer cath mgt to Urology 3. Prostate cancer 1. S/p suprapubic catheter placement 4. Chronic microcytic anemia  1. Follow CBC 2. Stable overnight 5. HTN 1. BP stable 2. Cont current regimen 6. COPD 1. No wheezing on exam 2. On min o2 support 7. CAD 1. Stable 2. Denies chest pain or sob 8. DM2 1. Diet controlled prior to admit 2. Last documented a1c of 6.1 in 2013 3. A1c pending 4. Added SSI coverage. Glucose stable overnight 9. Tobacco abuse 1. Cessation was done 10. DVT prophylaxis 1. SCD's  Code Status: Full Family Communication: Pt in room  Disposition Plan: Pending   Consultants:  Urology  Procedures:    Antibiotics:  Vancymycin 3/4>>>  Zosyn 3/4>>>  HPI/Subjective: Pt is now s/p surgery. Reports pain is improved pos-operatively  Objective: Filed Vitals:   02/24/15 0847 02/24/15 0902 02/24/15 0917 02/24/15 0959  BP: 129/61 129/53 133/64 129/67  Pulse: 73 66 70 68  Temp: 99 F (37.2 C)  99.2 F (37.3 C) 98.5 F (36.9 C)  TempSrc:    Oral  Resp: 12 15 15 15   Height:      Weight:      SpO2: 100% 95% 97% 97%    Intake/Output Summary (Last 24 hours) at 02/24/15 1225 Last data filed at 02/24/15 1030  Gross per 24 hour  Intake 3706.67 ml  Output   4160 ml  Net -453.33 ml   Filed Weights   02/22/15 0739  Weight: 70.761 kg (156 lb)    Exam:   General:  Awake, in nad  Cardiovascular: regular, s1,  s2  Respiratory: normal resp effort, no wheezing  Abdomen: soft,nondistended  Musculoskeletal: perfused, no clubbing   Data Reviewed: Basic Metabolic Panel:  Recent Labs Lab 02/22/15 0748 02/23/15 0440 02/24/15 0517  NA 139 137 138  K 3.9 3.7 3.6  CL 103 104 105  CO2 25 25 23   GLUCOSE 128* 122* 97  BUN 16 14 12   CREATININE 1.02 0.95 1.02  CALCIUM 9.2 8.6 8.5   Liver Function Tests:  Recent Labs Lab 02/22/15 0748 02/23/15 0440 02/24/15 0517  AST 13 11 11   ALT 17 11 11   ALKPHOS 68 55 77  BILITOT 0.5 0.7 0.6  PROT 6.7 5.7* 5.9*  ALBUMIN 3.6 2.8* 2.7*   No results for input(s): LIPASE, AMYLASE in the last 168 hours. No results for input(s): AMMONIA in the last 168 hours. CBC:  Recent Labs Lab 02/22/15 0748 02/23/15 0440 02/24/15 0517  WBC 17.4* 22.3* 20.8*  NEUTROABS 15.2*  --  16.9*  HGB 9.5* 8.7* 8.3*  HCT 29.6* 26.9* 25.8*  MCV 76.1* 75.1* 75.9*  PLT 304 296 282   Cardiac Enzymes: No results for input(s): CKTOTAL, CKMB, CKMBINDEX, TROPONINI in the last 168 hours. BNP (last 3 results) No results for input(s): BNP in the last 8760 hours.  ProBNP (last 3 results) No results for input(s): PROBNP in the last 8760 hours.  CBG:  Recent Labs Lab 02/23/15 2141 02/24/15 1156  GLUCAP 125*  104*    Recent Results (from the past 240 hour(s))  Blood culture (routine x 2)     Status: None (Preliminary result)   Collection Time: 02/22/15  7:49 AM  Result Value Ref Range Status   Specimen Description BLOOD LEFT ANTECUBITAL  Final   Special Requests BOTTLES DRAWN AEROBIC AND ANAEROBIC 5CCS  Final   Culture   Final           BLOOD CULTURE RECEIVED NO GROWTH TO DATE CULTURE WILL BE HELD FOR 5 DAYS BEFORE ISSUING A FINAL NEGATIVE REPORT Performed at [facility]    Report Status PENDING  Incomplete  Blood culture (routine x 2)     Status: None (Preliminary result)   Collection Time: 02/22/15  7:53 AM  Result Value Ref Range Status   Specimen  Description BLOOD RIGHT HAND  Final   Special Requests BOTTLES DRAWN AEROBIC AND ANAEROBIC 5CCS  Final   Culture   Final           BLOOD CULTURE RECEIVED NO GROWTH TO DATE CULTURE WILL BE HELD FOR 5 DAYS BEFORE ISSUING A FINAL NEGATIVE REPORT Performed at [facility]    Report Status PENDING  Incomplete  Urine culture     Status: None (Preliminary result)   Collection Time: 02/22/15  9:05 AM  Result Value Ref Range Status   Specimen Description URINE, RANDOM  Final   Special Requests NONE  Final   Colony Count   Final    >=100,000 COLONIES/ML Performed at [facility]    Culture   Final    PSEUDOMONAS AERUGINOSA Performed at [facility]    Report Status PENDING  Incomplete     Studies: No results found.  Scheduled Meds: . brimonidine  1 drop Both Eyes BID  . dorzolamide  1 drop Left Eye TID  . insulin aspart  0-15 Units Subcutaneous TID WC  . insulin aspart  0-5 Units Subcutaneous QHS  . latanoprost  1 drop Both Eyes QHS  . nicotine  14 mg Transdermal Daily  . pantoprazole  40 mg Oral Daily  . piperacillin-tazobactam (ZOSYN)  IV  3.375 g Intravenous Q8H  . tamsulosin  0.4 mg Oral q morning - 10a  . timolol  1 drop Both Eyes BID  . vancomycin  750 mg Intravenous Q12H   Continuous Infusions: . sodium chloride 125 mL/hr at 02/22/15 1043  . sodium chloride 125 mL/hr at 02/24/15 0410    Principal Problem:   Epididymitis, left Active Problems:   Essential hypertension   Fever   Cellulitis   Orchitis   Testicular abscess   DM type 2 (diabetes mellitus, type 2)   Microcytic anemia   Epididymitis   UTI (lower urinary tract infection)    CHIU, [place] [facility] Pager [phone]. If 7PM-7AM, please contact night-coverage at [email or web address], password [facility] 02/24/2015, 12:25 PM  LOS: 2 days

## 2015-02-24 NOTE — Consult Note (Signed)
Subjective: The patient reports continued pain in the left testicle. It remains severe. It is unremitting and has not improved over the past 24 hours.  Objective: Vital signs in last 24 hours: Temp:  [99.2 F (37.3 C)-100.4 F (38 C)] 99.8 F (37.7 C) (03/06 0559) Pulse Rate:  [70-72] 72 (03/05 2144) Resp:  [18-21] 18 (03/06 0559) BP: (115-135)/(55-64) 115/55 mmHg (03/06 0559) SpO2:  [94 %-97 %] 94 % (03/06 0559)A  Intake/Output from previous day: 03/05 0701 - 03/06 0700 In: 3346.7 [P.O.:680; I.V.:2666.7] Out: 3700 [Urine:3700] Intake/Output this shift:    Past Medical History  Diagnosis Date  . Mitral regurgitation   . Glaucoma   . Depression   . Hypertension   . Hyperlipidemia, mixed   . History of syncope     03-22-2014  DX  VAGAL RESPONSE  . Coronary atherosclerosis   . COPD (chronic obstructive pulmonary disease)   . GERD (gastroesophageal reflux disease)   . Diverticulosis of colon   . History of gastritis   . Prostate cancer   . BPH with obstruction/lower urinary tract symptoms   . ED (erectile dysfunction)   . Arthritis     SHOUDLERS, LUMBAR  . 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 History of scabies     2008  . Type 2 diabetes, diet controlled   . Wears glasses   . Full dentures     Physical Exam:  Lungs - Normal respiratory effort, chest expands symmetrically.  Abdomen - Soft, non-tender & non-distended. Scrotum - no sign of erythema, crepitus or discharge Testicles - right testicle is normal. Left testicle remains tender with a markedly tender epididymis.  Lab Results:  Recent Labs  02/22/15 0748 02/23/15 0440 02/24/15 0517  WBC 17.4* 22.3* 20.8*  HGB 9.5* 8.7* 8.3*  HCT 29.6* 26.9* 25.8*   BMET  Recent Labs  02/22/15 0748 02/23/15 0440  NA 139 137  K 3.9 3.7  CL 103 104  CO2 25 25  GLUCOSE 128* 122*  BUN 16 14  CREATININE 1.02 0.95  CALCIUM 9.2 8.6   No results for input(s): LABURIN in the last  72 hours. Results for orders placed or performed during the hospital encounter of 02/22/15  Blood culture (routine x 2)     Status: None (Preliminary result)   Collection Time: 02/22/15  7:49 AM  Result Value Ref Range Status   Specimen Description BLOOD LEFT ANTECUBITAL  Final   Special Requests BOTTLES DRAWN AEROBIC AND ANAEROBIC 5CCS  Final   Culture   Final           BLOOD CULTURE RECEIVED NO GROWTH TO DATE CULTURE WILL BE HELD FOR 5 DAYS BEFORE ISSUING A FINAL NEGATIVE REPORT Performed at [facility]    Report Status PENDING  Incomplete  Blood culture (routine x 2)     Status: None (Preliminary result)   Collection Time: 02/22/15  7:53 AM  Result Value Ref Range Status   Specimen Description BLOOD RIGHT HAND  Final   Special Requests BOTTLES DRAWN AEROBIC AND ANAEROBIC 5CCS  Final   Culture   Final           BLOOD CULTURE RECEIVED NO GROWTH TO DATE CULTURE WILL BE HELD FOR 5 DAYS BEFORE ISSUING A FINAL NEGATIVE REPORT Performed at [facility]    Report Status PENDING  Incomplete  Urine culture     Status: None (Preliminary result)  Collection Time: 02/22/15  9:05 AM  Result Value Ref Range Status   Specimen Description URINE, RANDOM  Final   Special Requests NONE  Final   Colony Count   Final    >=100,000 COLONIES/ML Performed at [facility]    Culture   Final    PSEUDOMONAS AERUGINOSA Performed at [facility]    Report Status PENDING  Incomplete    Studies/Results: No results found.  Assessment: His urine culture has grown Pseudomonas. He is on appropriate antibiotics. His white blood cell count remains markedly elevated. His pain has not improved at all. I therefore had a discussion with him today about surgical intervention. We discussed scrotal exploration with left orchiectomy. I've gone over the procedure with him in detail including the incision used, the risks and complications, the alternatives, and the probability of  success and the anticipated postoperative course. He understands and has elected to proceed. We also discussed placement of a testicular prosthesis and he has declined this.   Plan:  he will be taken to the operating room this morning for scrotal exploration and left orchiectomy.  02/24/2015, 8:01 AM

## 2015-02-24 NOTE — Anesthesia Procedure Notes (Signed)
Procedure Name: LMA Insertion Date/Time: 02/24/2015 7:51 AM Performed by: Carola Frost Pre-anesthesia Checklist: Patient identified, Timeout performed, Emergency Drugs available, Suction available and Patient being monitored Patient Re-evaluated:Patient Re-evaluated prior to inductionOxygen Delivery Method: Circle system utilized Preoxygenation: Pre-oxygenation with 100% oxygen Intubation Type: IV induction Ventilation: Mask ventilation without difficulty LMA: LMA inserted LMA Size: 5.0 Number of attempts: 1 Placement Confirmation: positive ETCO2 and breath sounds checked- equal and bilateral Tube secured with: Tape Dental Injury: Teeth and Oropharynx as per pre-operative assessment

## 2015-02-24 NOTE — Op Note (Signed)
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 Scrotal exploration 2. Left orchiectomy  SURGEON:  Claybon Jabs  INDICATION: Russell [name] is a 74 year old male who was admitted with severe left testicular pain. 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 The patient was placed on antibiotics and a urine culture has grown Pseudomonas however he has not responded to antibiotic therapy and continues to have severe pain with a markedly elevated white count. We therefore discussed proceeding with exploration of his scrotum and orchiectomy.  ANESTHESIA:  General  EBL:  Minimal  DRAINS: None  LOCAL MEDICATIONS USED:  None  SPECIMEN:  Left testicle and epididymis  Description of procedure: After informed consent the patient was taken to the operating room and placed on the table in a supine position. General anesthesia was then administered. Once fully anesthetized  the genitalia were sterilely prepped and draped in standard fashion. An official timeout was then performed.  A midline median raphae scrotal incision was then made and carried down over the left testicle. I incised the parietal tunica albuginea and exposed the testicle which appeared to be relatively normal in appearance. I could palpate no definite mass within the testicle. The surrounding tissue was rather adherent due to inflammation and I was able to dissect the testicle and epididymis free. I also was able to identify the cord and several centimeters away from the testicle I isolated the spermatic cord. I then used a Kelly clamp to isolate the cord into 2 equal portions and placed a Kelly clamp across each of these and divided them. The testicle was then freed from the scrotum and sent to pathology.  The  spermatic cord portions were then doubly ligated with a 0 silk tie and a 0 silk suture ligature. The wound was inspected for any bleeding and when none was noted. I irrigated the left hemiscrotum with copious amounts of sterile saline and again noted no bleeding and therefore closed the incision.  I closed the scrotum by first reapproximating the deep scrotal tissue with running, locking 3-0 chromic suture and then closed the skin with a running 3-0 chromic. Neosporin, a fluffed Curlex and mesh briefs were applied and the patient was awakened and taken to the recovery room in stable and satisfactory condition. He tolerated procedure well no intraoperative complications.   PATIENT DISPOSITION:  PACU - hemodynamically stable.

## 2015-02-24 NOTE — Anesthesia Preprocedure Evaluation (Addendum)
Anesthesia Evaluation  Patient identified by MRN, date of birth, ID band Patient awake    Reviewed: Allergy & Precautions, NPO status , Patient's Chart, lab work & pertinent test results  History of Anesthesia Complications Negative for: history of anesthetic complications  Airway Mallampati: II  TM Distance: >3 FB Neck ROM: Full    Dental  (+) Poor Dentition, Missing, Loose, Dental Advisory Given, Chipped   Pulmonary COPD COPD inhaler, Current Smoker,  breath sounds clear to auscultation        Cardiovascular hypertension, Pt. 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     Neuro/Psych Depression negative neurological ROS     GI/Hepatic Neg liver ROS, GERD-  Medicated and Controlled,  Endo/Other  diabetes (glu 125)  Renal/GU negative Renal ROS     Musculoskeletal  (+) Arthritis -,   Abdominal   Peds  Hematology  (+) Blood dyscrasia (Hb 8.3), ,   Anesthesia Other Findings Prostate cancer: suprapubic tube  Reproductive/Obstetrics                           Anesthesia Physical Anesthesia Plan  ASA: III  Anesthesia Plan: General   Post-op Pain Management:    Induction: Intravenous  Airway Management Planned: LMA  Additional Equipment:   Intra-op Plan:   Post-operative Plan:   Informed Consent: I have reviewed the patients History and Physical, chart, labs and discussed the procedure including the risks, benefits and alternatives for the proposed anesthesia with the patient or authorized representative who has indicated his/her understanding and acceptance.   Dental advisory given  Plan Discussed with: CRNA and Surgeon  Anesthesia Plan Comments: (Plan routine monitors, GA- LMA OK)        Anesthesia Quick  Evaluation

## 2015-02-25 ENCOUNTER — Encounter (HOSPITAL_COMMUNITY): Payer: Self-pay | Admitting: Urology

## 2015-02-25 LAB — CBC
HEMATOCRIT: 27.1 % — AB (ref 39.0–52.0)
Hemoglobin: 8.7 g/dL — ABNORMAL LOW (ref 13.0–17.0)
MCH: 24.2 pg — ABNORMAL LOW (ref 26.0–34.0)
MCHC: 32.1 g/dL (ref 30.0–36.0)
MCV: 75.5 fL — AB (ref 78.0–100.0)
Platelets: 338 10*3/uL (ref 150–400)
RBC: 3.59 MIL/uL — ABNORMAL LOW (ref 4.22–5.81)
RDW: 18.1 % — ABNORMAL HIGH (ref 11.5–15.5)
WBC: 11.7 10*3/uL — ABNORMAL HIGH (ref 4.0–10.5)

## 2015-02-25 LAB — BASIC METABOLIC PANEL
ANION GAP: 7 (ref 5–15)
BUN: 8 mg/dL (ref 6–23)
CALCIUM: 8.5 mg/dL (ref 8.4–10.5)
CO2: 25 mmol/L (ref 19–32)
Chloride: 107 mmol/L (ref 96–112)
Creatinine, Ser: 0.96 mg/dL (ref 0.50–1.35)
GFR calc Af Amer: >90 mL/min (ref >90)
GFR, EST NON AFRICAN AMERICAN: 80 mL/min — AB (ref >90)
Glucose, Bld: 92 mg/dL (ref 70–99)
Potassium: 3.6 mmol/L (ref 3.5–5.1)
SODIUM: 139 mmol/L (ref 135–145)

## 2015-02-25 LAB — GLUCOSE, CAPILLARY
GLUCOSE-CAPILLARY: 112 mg/dL — AB (ref 70–99)
Glucose-Capillary: 94 mg/dL (ref 70–99)

## 2015-02-25 LAB — HEMOGLOBIN A1C
HEMOGLOBIN A1C: 5.9 % — AB (ref 4.8–5.6)
Mean Plasma Glucose: 123 mg/dL

## 2015-02-25 MED ORDER — TAMSULOSIN HCL 0.4 MG PO CAPS: 0.4000 mg | ORAL_CAPSULE | Freq: Every day | ORAL | Status: DC

## 2015-02-25 NOTE — Progress Notes (Addendum)
[facility] PROGRESS NOTE  Russell [name] MPN:[id number] DOB: [birth year] DOA: 02/22/2015 PCP: Kevan Ny, MD  Assessment/Plan: 1. Epididymitis with sepsis, unable to determine if related to suprapubic catheter 1. Pt evaluated by Urology. Appreciate input 2. Pt is now s/p surgery with orchiectomy on 3/6 3. Leukocytosis improved to 11/7k from 20.8k yesterday 2. Complicated pseudomonas UTI 1. Pt is continued on broad spectrum abx 2. Urine cx with >100,000 pseudomonas 3. Consider changing out suprapubic catheter. Defer cath mgt to Urology 3. Prostate cancer 1. S/p suprapubic catheter placement 4. Chronic microcytic anemia  1. Follow CBC 2. Stable overnight 5. HTN 1. BP stable 2. Cont current regimen 6. COPD 1. No wheezing on exam 2. On min o2 support 7. CAD 1. Stable 2. Denies chest pain or sob 8. DM2 1. Diet controlled prior to admit 2. Last documented a1c of 6.1 in 2013 3. A1c pending 4. Added SSI coverage. Glucose stable overnight 9. Tobacco abuse 1. Cessation was done 10. DVT prophylaxis 1. SCD's  Code Status: Full Family Communication: Pt in room  Disposition Plan: Pending   Consultants:  Urology  Procedures:    Antibiotics:  Vancymycin 3/4>>>  Zosyn 3/4>>>  HPI/Subjective: Patient wants to eat this AM. No acute events noted overnight  Objective: Filed Vitals:   02/24/15 1634 02/24/15 2208 02/25/15 0440 02/25/15 1341  BP:  132/66 135/47 151/71  Pulse: 70   59  Temp:  98.8 F (37.1 C) 99 F (37.2 C) 99.4 F (37.4 C)  TempSrc:  Oral Oral Oral  Resp:  18 20 20   Height:      Weight:      SpO2: 95% 98% 95% 96%    Intake/Output Summary (Last 24 hours) at 02/25/15 1441 Last data filed at 02/25/15 1343  Gross per 24 hour  Intake 1834.58 ml  Output   2975 ml  Net -1140.42 ml   Filed Weights   02/22/15 0739  Weight: 70.761 kg (156 lb)    Exam:   General:  Awake, in nad  Cardiovascular: regular, s1, s2  Respiratory: normal  resp effort, no wheezing  Abdomen: soft,nondistended  Musculoskeletal: perfused, no clubbing   Data Reviewed: Basic Metabolic Panel:  Recent Labs Lab 02/22/15 0748 02/23/15 0440 02/24/15 0517 02/25/15 0845  NA 139 137 138 139  K 3.9 3.7 3.6 3.6  CL 103 104 105 107  CO2 25 25 23 25   GLUCOSE 128* 122* 97 92  BUN 16 14 12 8   CREATININE 1.02 0.95 1.02 0.96  CALCIUM 9.2 8.6 8.5 8.5   Liver Function Tests:  Recent Labs Lab 02/22/15 0748 02/23/15 0440 02/24/15 0517  AST 13 11 11   ALT 17 11 11   ALKPHOS 68 55 77  BILITOT 0.5 0.7 0.6  PROT 6.7 5.7* 5.9*  ALBUMIN 3.6 2.8* 2.7*   No results for input(s): LIPASE, AMYLASE in the last 168 hours. No results for input(s): AMMONIA in the last 168 hours. CBC:  Recent Labs Lab 02/22/15 0748 02/23/15 0440 02/24/15 0517 02/25/15 0845  WBC 17.4* 22.3* 20.8* 11.7*  NEUTROABS 15.2*  --  16.9*  --   HGB 9.5* 8.7* 8.3* 8.7*  HCT 29.6* 26.9* 25.8* 27.1*  MCV 76.1* 75.1* 75.9* 75.5*  PLT 304 296 282 338   Cardiac Enzymes: No results for input(s): CKTOTAL, CKMB, CKMBINDEX, TROPONINI in the last 168 hours. BNP (last 3 results) No results for input(s): BNP in the last 8760 hours.  ProBNP (last 3 results) No results for input(s): PROBNP in the  last 8760 hours.  CBG:  Recent Labs Lab 02/24/15 0849 02/24/15 1156 02/24/15 1749 02/24/15 2206 02/25/15 0750  GLUCAP 112* 104* 110* 110* 94    Recent Results (from the past 240 hour(s))  Blood culture (routine x 2)     Status: None (Preliminary result)   Collection Time: 02/22/15  7:49 AM  Result Value Ref Range Status   Specimen Description BLOOD LEFT ANTECUBITAL  Final   Special Requests BOTTLES DRAWN AEROBIC AND ANAEROBIC 5CCS  Final   Culture   Final           BLOOD CULTURE RECEIVED NO GROWTH TO DATE CULTURE WILL BE HELD FOR 5 DAYS BEFORE ISSUING A FINAL NEGATIVE REPORT Performed at [facility]    Report Status PENDING  Incomplete  Blood culture (routine x 2)      Status: None (Preliminary result)   Collection Time: 02/22/15  7:53 AM  Result Value Ref Range Status   Specimen Description BLOOD RIGHT HAND  Final   Special Requests BOTTLES DRAWN AEROBIC AND ANAEROBIC 5CCS  Final   Culture   Final           BLOOD CULTURE RECEIVED NO GROWTH TO DATE CULTURE WILL BE HELD FOR 5 DAYS BEFORE ISSUING A FINAL NEGATIVE REPORT Performed at [facility]    Report Status PENDING  Incomplete  Urine culture     Status: None   Collection Time: 02/22/15  9:05 AM  Result Value Ref Range Status   Specimen Description URINE, RANDOM  Final   Special Requests NONE  Final   Colony Count   Final    >=100,000 COLONIES/ML Performed at [facility]    Culture   Final    PSEUDOMONAS AERUGINOSA Performed at [facility]    Report Status 02/24/2015 FINAL  Final   Organism ID, Bacteria PSEUDOMONAS AERUGINOSA  Final      Susceptibility   Pseudomonas aeruginosa - MIC*    CEFEPIME 4 SENSITIVE Sensitive     CEFTAZIDIME 4 SENSITIVE Sensitive     CIPROFLOXACIN 0.5 SENSITIVE Sensitive     GENTAMICIN 2 SENSITIVE Sensitive     IMIPENEM 2 SENSITIVE Sensitive     PIP/TAZO <=4 SENSITIVE Sensitive     TOBRAMYCIN <=1 SENSITIVE Sensitive     * PSEUDOMONAS AERUGINOSA     Studies: No results found.  Scheduled Meds: . brimonidine  1 drop Both Eyes BID  . dorzolamide  1 drop Left Eye TID  . latanoprost  1 drop Both Eyes QHS  . nicotine  14 mg Transdermal Daily  . pantoprazole  40 mg Oral Daily  . piperacillin-tazobactam (ZOSYN)  IV  3.375 g Intravenous Q8H  . tamsulosin  0.4 mg Oral q morning - 10a  . timolol  1 drop Both Eyes BID  . vancomycin  750 mg Intravenous Q12H   Continuous Infusions: . sodium chloride 125 mL/hr at 02/22/15 1043  . sodium chloride Stopped (02/24/15 0700)    Principal Problem:   Epididymitis, left Active Problems:   Essential hypertension   Fever   Cellulitis   Orchitis   Testicular abscess   DM type 2 (diabetes  mellitus, type 2)   Microcytic anemia   Epididymitis   UTI (lower urinary tract infection)    Athena Baltz, [facility] Pager [phone]. If 7PM-7AM, please contact night-coverage at [email or web address], password [facility] 02/25/2015, 2:41 PM  LOS: 3 days

## 2015-02-25 NOTE — Progress Notes (Signed)
ANTIBIOTIC CONSULT NOTE - FOLLOW UP  Pharmacy Consult for vancomycin and zosyn Indication: epidiymitis with sepsis + pseudomonal UTI  No Known Allergies  Patient Measurements: Height: 6\' 2"  (188 cm) Weight: 156 lb (70.761 kg) IBW/kg (Calculated) : 82.2 Adjusted Body Weight:   Vital Signs: Temp: 99 F (37.2 C) (03/07 0440) Temp Source: Oral (03/07 0440) BP: 135/47 mmHg (03/07 0440) Intake/Output from previous day: 03/06 0701 - 03/07 0700 In: 2774.6 [P.O.:460; I.V.:2314.6] Out: 3035 [Urine:3025; Blood:10] Intake/Output from this shift:    Labs:  Recent Labs  02/23/15 0440 02/24/15 0517 02/25/15 0845  WBC 22.3* 20.8* 11.7*  HGB 8.7* 8.3* 8.7*  PLT 296 282 338  CREATININE 0.95 1.02 0.96   Estimated Creatinine Clearance: 67.6 mL/min (by C-G formula based on Cr of 0.96). No results for input(s): VANCOTROUGH, VANCOPEAK, VANCORANDOM, GENTTROUGH, GENTPEAK, GENTRANDOM, TOBRATROUGH, TOBRAPEAK, TOBRARND, AMIKACINPEAK, AMIKACINTROU, AMIKACIN in the last 72 hours.   Microbiology: Recent Results (from the past 720 hour(s))  Blood culture (routine x 2)     Status: None (Preliminary result)   Collection Time: 02/22/15  7:49 AM  Result Value Ref Range Status   Specimen Description BLOOD LEFT ANTECUBITAL  Final   Special Requests BOTTLES DRAWN AEROBIC AND ANAEROBIC 5CCS  Final   Culture   Final           BLOOD CULTURE RECEIVED NO GROWTH TO DATE CULTURE WILL BE HELD FOR 5 DAYS BEFORE ISSUING A FINAL NEGATIVE REPORT Performed at [facility]    Report Status PENDING  Incomplete  Blood culture (routine x 2)     Status: None (Preliminary result)   Collection Time: 02/22/15  7:53 AM  Result Value Ref Range Status   Specimen Description BLOOD RIGHT HAND  Final   Special Requests BOTTLES DRAWN AEROBIC AND ANAEROBIC 5CCS  Final   Culture   Final           BLOOD CULTURE RECEIVED NO GROWTH TO DATE CULTURE WILL BE HELD FOR 5 DAYS BEFORE ISSUING A FINAL NEGATIVE REPORT Performed  at [facility]    Report Status PENDING  Incomplete  Urine culture     Status: None   Collection Time: 02/22/15  9:05 AM  Result Value Ref Range Status   Specimen Description URINE, RANDOM  Final   Special Requests NONE  Final   Colony Count   Final    >=100,000 COLONIES/ML Performed at [facility]    Culture   Final    PSEUDOMONAS AERUGINOSA Performed at [facility]    Report Status 02/24/2015 FINAL  Final   Organism ID, Bacteria PSEUDOMONAS AERUGINOSA  Final      Susceptibility   Pseudomonas aeruginosa - MIC*    CEFEPIME 4 SENSITIVE Sensitive     CEFTAZIDIME 4 SENSITIVE Sensitive     CIPROFLOXACIN 0.5 SENSITIVE Sensitive     GENTAMICIN 2 SENSITIVE Sensitive     IMIPENEM 2 SENSITIVE Sensitive     PIP/TAZO <=4 SENSITIVE Sensitive     TOBRAMYCIN <=1 SENSITIVE Sensitive     * PSEUDOMONAS AERUGINOSA    Anti-infectives    Start     Dose/Rate Route Frequency Ordered Stop   02/22/15 2200  vancomycin (VANCOCIN) IVPB 750 mg/150 ml premix     750 mg 150 mL/hr over 60 Minutes Intravenous Every 12 hours 02/22/15 0923     02/22/15 1800  piperacillin-tazobactam (ZOSYN) IVPB 3.375 g     3.375 g 12.5 mL/hr over 240 Minutes Intravenous Every 8 hours  02/22/15 0923     02/22/15 0930  piperacillin-tazobactam (ZOSYN) IVPB 3.375 g     3.375 g 100 mL/hr over 30 Minutes Intravenous  Once 02/22/15 0923 02/22/15 1007   02/22/15 0930  vancomycin (VANCOCIN) IVPB 1000 mg/200 mL premix     1,000 mg 200 mL/hr over 60 Minutes Intravenous  Once 02/22/15 4132 02/22/15 1041      Assessment: 74 yo male with epididymitis with sepsis + pseudomonal UTI is currently on vancomycin and zosyn.  SCr a little better at 0.96.    Goal of Therapy:  Vancomycin trough level 15-20 mcg/ml  Plan:  Continue zosyn 3.375g iv q8h (4hr infusion) and vancomycin 750 mg iv q12h for now Check vancomycin trough tonight to reassses dosing  Citlali Gautney, Tsz[facility]in 02/25/2015,1:22 PM

## 2015-02-25 NOTE — Progress Notes (Signed)
Patient refused vanc trough- spoke with pharmacist- asked that vanc still be hung tonight and patient will be checked tomr

## 2015-02-25 NOTE — Progress Notes (Signed)
Urology Progress Note  1 Day Post-Op   Subjective: Post op L orchiectomy.  Micro: pseudomonas: pan sensitive. Surgical path. Pending.     No acute urologic events overnight. Ambulation:   positive Flatus:    positive Bowel movement  negative  Pain: complete resolution  Objective:  Blood pressure 151/71, pulse 59, temperature 99.4 F (37.4 C), temperature source Oral, resp. rate 20, height 6\' 2"  (1.88 m), weight 70.761 kg (156 lb), SpO2 96 %.  Physical Exam:  General:  No acute distress, awake Extremities: extremities normal, atraumatic, no cyanosis or edema Genitourinary:   Healing L hemiscrotum Foley:  s-p tube.     I/O last 3 completed shifts: In: 2774.6 [P.O.:460; I.V.:2314.6] Out: 4335 [Urine:4325; Blood:10]  Recent Labs     02/24/15  0517  02/25/15  0845  HGB  8.3*  8.7*  WBC  20.8*  11.7*  PLT  282  338    Recent Labs     02/24/15  0517  02/25/15  0845  NA  138  139  K  3.6  3.6  CL  105  107  CO2  23  25  BUN  12  8  CREATININE  1.02  0.96  CALCIUM  8.5  8.5  GFRNONAA  70*  80*  GFRAA  82*  >90     No results for input(s): INR, APTT in the last 72 hours.  Invalid input(s): PT   Invalid input(s): ABG  Assessment/Plan:  Continue any current medications. Cipro 500mg  BID.  S-p tube can be changed in  Office.

## 2015-02-25 NOTE — Clinical Documentation Improvement (Signed)
  Please clarify whether or not the patient's diagnosis of epididymitis and orchitis IS or IS NOT a complication related to his chronic indwelling suprapubic catheter:   - Yes, the epididymitis and orchitis is related to the suprapubic catheter   - No, the epididymitis and orchitis is not related to the suprapubic catheter   - Unable to clinically determine   Thank You, Erling Conte ,RN Clinical Documentation Specialist:  [phone] [facility]

## 2015-02-26 LAB — CBC

## 2015-02-26 LAB — VANCOMYCIN, TROUGH: Vancomycin Tr: 10.7 ug/mL (ref 10.0–20.0)

## 2015-02-26 MED ORDER — CIPROFLOXACIN HCL 500 MG PO TABS
500.0000 mg | ORAL_TABLET | Freq: Two times a day (BID) | ORAL | Status: DC
  Administered 2015-02-26: 500 mg via ORAL
  Filled 2015-02-26 (×3): qty 1

## 2015-02-26 MED ORDER — CIPROFLOXACIN HCL 500 MG PO TABS: 500.0000 mg | ORAL_TABLET | Freq: Two times a day (BID) | ORAL | Status: DC

## 2015-02-26 MED ORDER — HYDROCODONE-ACETAMINOPHEN 5-325 MG PO TABS: 1.0000 | ORAL_TABLET | ORAL | Status: DC | PRN

## 2015-02-26 NOTE — Discharge Summary (Signed)
Physician Discharge Summary  Russell [name] SAY:[id number] DOB: Feb 01, 1941 DOA: 02/22/2015  PCP: Kevan Ny, MD  Admit date: 02/22/2015 Discharge date: 02/26/2015  Time spent: 25 minutes  Recommendations for Outpatient Follow-up:  1. Follow up with PCP in 1-2 weeks 2.  Follow up with Dr. Gaynelle Arabian as scheduled  Discharge Diagnoses:  Principal Problem:   Epididymitis, left Active Problems:   Essential hypertension   Fever   Cellulitis   Orchitis   Testicular abscess   DM type 2 (diabetes mellitus, type 2)   Microcytic anemia   Epididymitis   UTI (lower urinary tract infection)   Discharge Condition: Improved   Diet recommendation: Diabetic  Filed Weights   02/22/15 0739  Weight: 70.761 kg (156 lb)    History of present illness:  Please see admit h and p from 3/4 for details. Briefly, pt presented with increased pain from suprapubic catheter site. The patient was admitted for further work up.  Hospital Course:  1. Epididymitis with sepsis, unable to determine if related to suprapubic catheter 1. Pt was evaluated by Urology. Appreciate input 2. Pt is now s/p surgery with orchiectomy on 3/6 3. Leukocytosis improved post operatively 2. Complicated pseudomonas UTI 1. Pt is continued on broad spectrum abx 2. Urine cx with >100,000 pan-sensitive pseudomonas 3. Discussed with Urology with plans to change out suprapubic cath as outpatient  4. Will continue patient with cipro based on sensitivities 3. Prostate cancer 1. S/p suprapubic catheter placement 4. Chronic microcytic anemia 1. Overall stable 5. HTN 1. BP remained stable 2. Cont current regimen 6. COPD 1. No wheezing on exam 2. On min o2 support 7. CAD 1. Stable 2. Denies chest pain or sob 8. DM2 1. Diet controlled prior to admit 2. Last documented a1c of 6.1 in 2013 3. Repeat a1c of 5.9 4. On SSI coverage. Glucose stable 9. Tobacco abuse 1. Cessation was done 10. DVT prophylaxis 1. SCD's while  inpatient  Procedures:  Scrotal exploration and L orchiectomy 3/6  Consultations:  Urology  Discharge Exam: Filed Vitals:   02/25/15 1341 02/25/15 2129 02/26/15 0554 02/26/15 1317  BP: 151/71 143/74 155/70 151/79  Pulse: 59 65 53 59  Temp: 99.4 F (37.4 C) 99.2 F (37.3 C) 98.3 F (36.8 C) 99 F (37.2 C)  TempSrc: Oral Oral Oral Oral  Resp: 20 18 16 18   Height:      Weight:      SpO2: 96% 95% 100% 100%    General: awake, in nad Cardiovascular: regular, s1, s2 Respiratory: normal resp effort, no wheezing  Discharge Instructions     Medication List    STOP taking these medications        cephALEXin 500 MG capsule  Commonly known as:  KEFLEX      TAKE these medications        albuterol 108 (90 BASE) MCG/ACT inhaler  Commonly known as:  PROVENTIL HFA;VENTOLIN HFA  Inhale 2 puffs into the lungs every 6 (six) hours as needed for wheezing or shortness of breath.     bimatoprost 0.01 % Soln  Commonly known as:  LUMIGAN  Place 1 drop into both eyes at bedtime.     CENTRUM SILVER ADULT 50+ PO  Take 1 tablet by mouth daily.     ciprofloxacin 500 MG tablet  Commonly known as:  CIPRO  Take 1 tablet (500 mg total) by mouth 2 (two) times daily.     COMBIGAN 0.2-0.5 % ophthalmic solution  Generic drug:  brimonidine-timolol  Place 1 drop into both eyes 2 (two) times daily.     dorzolamide 2 % ophthalmic solution  Commonly known as:  TRUSOPT  Place 1 drop into the left eye 3 (three) times daily.     esomeprazole 40 MG capsule  Commonly known as:  NEXIUM  Take 40 mg by mouth every morning.     Fish Oil 1000 MG Caps  Take 1 capsule by mouth every morning.     losartan 100 MG tablet  Commonly known as:  COZAAR  Take 1 tablet (100 mg total) by mouth daily.     oxyCODONE-acetaminophen 5-325 MG per tablet  Commonly known as:  PERCOCET  Take 1-2 tablets by mouth every 6 (six) hours as needed for moderate pain or severe pain.     phenazopyridine 200 MG tablet   Commonly known as:  PYRIDIUM  Take 1 tablet (200 mg total) by mouth 3 (three) times daily as needed for pain.     pravastatin 80 MG tablet  Commonly known as:  PRAVACHOL  Take 1 tablet (80 mg total) by mouth daily.     tamsulosin 0.4 MG Caps capsule  Commonly known as:  FLOMAX  Take 0.4 mg by mouth every morning.     trimethoprim 100 MG tablet  Commonly known as:  TRIMPEX  Take 1 tablet (100 mg total) by mouth 1 day or 1 dose.       No Known Allergies Follow-up Information    Follow up with Marlou Sa, ERIC, MD. Schedule an appointment as soon as possible for a visit in 1 week.   Specialty:  Internal Medicine   Why:  Appointment with Dr Marlou Sa 03/05/15 at 9:45am   Contact information:   [address] [place] [address] [phone]       Follow up with Ailene Rud, MD.   Specialty:  Urology   Why:  Doctor office will call patient at home with appointment   Contact information:   [place] [id number] [phone]        The results of significant diagnostics from this hospitalization (including imaging, microbiology, ancillary and laboratory) are listed below for reference.    Significant Diagnostic Studies: Dg Chest 2 View  02/22/2015   CLINICAL DATA:  One day history of cough and fever  EXAM: CHEST  2 VIEW  COMPARISON:  December 19, 2014  FINDINGS: There is no edema or consolidation. Heart size and pulmonary vascularity are normal. No adenopathy. End plate concavity is noted in several lower thoracic vertebral bodies consistent with osteoporotic change. There is postoperative change in the lower cervical spine region.  IMPRESSION: No edema or consolidation.  Evidence of osteoporosis.   Electronically Signed   By: Lowella Grip III M.D.   On: 02/22/2015 08:27   US Scrotum  02/22/2015   CLINICAL DATA:  Scrotal pain, more severe on the left than on the right  EXAM: SCROTAL ULTRASOUND  DOPPLER ULTRASOUND OF THE TESTICLES  TECHNIQUE: Complete  ultrasound examination of the testicles, epididymis, and other scrotal structures was performed. Color and spectral Doppler ultrasound were also utilized to evaluate blood flow to the testicles.  COMPARISON:  None.  FINDINGS: Right testicle  Measurements: 4.2 x 1.7 x 2.8 cm. No mass or microlithiasis visualized.  Left testicle  Measurements: 3.2 x 1.8 x 2.3 cm. No microlithiasis visualized. Within the left testis there is a complex cystic appearing area measuring 1.8 x 0.8 x 1.4 cm. This area is does  not show appreciable color flow. There is extensive color flow elsewhere throughout the left testis.  Right epididymis:  Normal in size and appearance.  Left epididymis: The left epididymis is diffusely enlarged and hyperemic. No well-defined mass is seen in the left epididymis.  Hydrocele: There is a moderate hydrocele on the left. There is no appreciable hydrocele on the right.  Varicocele:  None visualized.  Pulsed Doppler interrogation of both testes demonstrates normal low resistance arterial and venous waveforms bilaterally. The peak systolic velocity in the right testis is 15.5 cm/sec with an end-diastolic velocity of 2.0 cm/sec. The peak systolic velocity on the left is 5.9 cm with an end-diastolic velocity of 2.6 cm.  IMPRESSION: Evidence of epididymitis and orchitis on the left. A complex cystic area within the left testis is concerning for an intratesticular abscess. It should be noted that an atypical neoplasm cannot be excluded in this circumstance, and a repeat study after treatment for epididymitis and orchitis is advised to further evaluate this lesion within the left testis. There is a moderate hydrocele on the left as well.  No lesion is identified in the right scrotum. No testicular mass or torsion on the right.   Electronically Signed   By: Lowella Grip III M.D.   On: 02/22/2015 09:21   [place] Art/ven Flow Abd Pelv Doppler  02/22/2015   CLINICAL DATA:  Scrotal pain, more severe on the left than on  the right  EXAM: SCROTAL ULTRASOUND  DOPPLER ULTRASOUND OF THE TESTICLES  TECHNIQUE: Complete ultrasound examination of the testicles, epididymis, and other scrotal structures was performed. Color and spectral Doppler ultrasound were also utilized to evaluate blood flow to the testicles.  COMPARISON:  None.  FINDINGS: Right testicle  Measurements: 4.2 x 1.7 x 2.8 cm. No mass or microlithiasis visualized.  Left testicle  Measurements: 3.2 x 1.8 x 2.3 cm. No microlithiasis visualized. Within the left testis there is a complex cystic appearing area measuring 1.8 x 0.8 x 1.4 cm. This area is does not show appreciable color flow. There is extensive color flow elsewhere throughout the left testis.  Right epididymis:  Normal in size and appearance.  Left epididymis: The left epididymis is diffusely enlarged and hyperemic. No well-defined mass is seen in the left epididymis.  Hydrocele: There is a moderate hydrocele on the left. There is no appreciable hydrocele on the right.  Varicocele:  None visualized.  Pulsed Doppler interrogation of both testes demonstrates normal low resistance arterial and venous waveforms bilaterally. The peak systolic velocity in the right testis is 15.5 cm/sec with an end-diastolic velocity of 2.0 cm/sec. The peak systolic velocity on the left is 5.9 cm with an end-diastolic velocity of 2.6 cm.  IMPRESSION: Evidence of epididymitis and orchitis on the left. A complex cystic area within the left testis is concerning for an intratesticular abscess. It should be noted that an atypical neoplasm cannot be excluded in this circumstance, and a repeat study after treatment for epididymitis and orchitis is advised to further evaluate this lesion within the left testis. There is a moderate hydrocele on the left as well.  No lesion is identified in the right scrotum. No testicular mass or torsion on the right.   Electronically Signed   By: Lowella Grip III M.D.   On: 02/22/2015 09:21   Dg Finger  Thumb Right  02/04/2015   CLINICAL DATA:  The patient slammed right thumb in a car door today with distal right thumb pain  EXAM: RIGHT THUMB 2+V  COMPARISON:  None.  FINDINGS: There is comminuted displaced fracture of the right first distal phalanx. Associated soft tissue swelling is identified. Patient is status post prior fixation of the first metacarpal-carpal joint.  IMPRESSION: Comminuted displaced fracture of the right first distal phalanx.   Electronically Signed   By: Abelardo Diesel M.D.   On: 02/04/2015 19:04    Microbiology: Recent Results (from the past 240 hour(s))  Blood culture (routine x 2)     Status: None (Preliminary result)   Collection Time: 02/22/15  7:49 AM  Result Value Ref Range Status   Specimen Description BLOOD LEFT ANTECUBITAL  Final   Special Requests BOTTLES DRAWN AEROBIC AND ANAEROBIC 5CCS  Final   Culture   Final           BLOOD CULTURE RECEIVED NO GROWTH TO DATE CULTURE WILL BE HELD FOR 5 DAYS BEFORE ISSUING A FINAL NEGATIVE REPORT Performed at [facility]    Report Status PENDING  Incomplete  Blood culture (routine x 2)     Status: None (Preliminary result)   Collection Time: 02/22/15  7:53 AM  Result Value Ref Range Status   Specimen Description BLOOD RIGHT HAND  Final   Special Requests BOTTLES DRAWN AEROBIC AND ANAEROBIC 5CCS  Final   Culture   Final           BLOOD CULTURE RECEIVED NO GROWTH TO DATE CULTURE WILL BE HELD FOR 5 DAYS BEFORE ISSUING A FINAL NEGATIVE REPORT Performed at [facility]    Report Status PENDING  Incomplete  Urine culture     Status: None   Collection Time: 02/22/15  9:05 AM  Result Value Ref Range Status   Specimen Description URINE, RANDOM  Final   Special Requests NONE  Final   Colony Count   Final    >=100,000 COLONIES/ML Performed at [facility]    Culture   Final    PSEUDOMONAS AERUGINOSA Performed at [facility]    Report Status 02/24/2015 FINAL  Final   Organism ID,  Bacteria PSEUDOMONAS AERUGINOSA  Final      Susceptibility   Pseudomonas aeruginosa - MIC*    CEFEPIME 4 SENSITIVE Sensitive     CEFTAZIDIME 4 SENSITIVE Sensitive     CIPROFLOXACIN 0.5 SENSITIVE Sensitive     GENTAMICIN 2 SENSITIVE Sensitive     IMIPENEM 2 SENSITIVE Sensitive     PIP/TAZO <=4 SENSITIVE Sensitive     TOBRAMYCIN <=1 SENSITIVE Sensitive     * PSEUDOMONAS AERUGINOSA     Labs: Basic Metabolic Panel:  Recent Labs Lab 02/22/15 0748 02/23/15 0440 02/24/15 0517 02/25/15 0845  NA 139 137 138 139  K 3.9 3.7 3.6 3.6  CL 103 104 105 107  CO2 25 25 23 25   GLUCOSE 128* 122* 97 92  BUN 16 14 12 8   CREATININE 1.02 0.95 1.02 0.96  CALCIUM 9.2 8.6 8.5 8.5   Liver Function Tests:  Recent Labs Lab 02/22/15 0748 02/23/15 0440 02/24/15 0517  AST 13 11 11   ALT 17 11 11   ALKPHOS 68 55 77  BILITOT 0.5 0.7 0.6  PROT 6.7 5.7* 5.9*  ALBUMIN 3.6 2.8* 2.7*   No results for input(s): LIPASE, AMYLASE in the last 168 hours. No results for input(s): AMMONIA in the last 168 hours. CBC:  Recent Labs Lab 02/22/15 0748 02/23/15 0440 02/24/15 0517 02/25/15 0845  WBC 17.4* 22.3* 20.8* 11.7*  NEUTROABS 15.2*  --  16.9*  --   HGB 9.5*  8.7* 8.3* 8.7*  HCT 29.6* 26.9* 25.8* 27.1*  MCV 76.1* 75.1* 75.9* 75.5*  PLT 304 296 282 338   Cardiac Enzymes: No results for input(s): CKTOTAL, CKMB, CKMBINDEX, TROPONINI in the last 168 hours. BNP: BNP (last 3 results) No results for input(s): BNP in the last 8760 hours.  ProBNP (last 3 results) No results for input(s): PROBNP in the last 8760 hours.  CBG:  Recent Labs Lab 02/24/15 0849 02/24/15 1156 02/24/15 1749 02/24/15 2206 02/25/15 0750  GLUCAP 112* 104* 110* 110* 94    Signed:  Gevin Perea K  [facility] 02/26/2015, 1:54 PM

## 2015-02-26 NOTE — Progress Notes (Signed)
Patient refuses labs this am. Will continue to monitor

## 2015-02-26 NOTE — Progress Notes (Signed)
Patient discharge teaching given, including activity, diet, follow-up appoints, and medications. Patient verbalized understanding of all discharge instructions. IV access was d/c'd. Vitals are stable. Skin is intact except as charted in most recent assessments. Pt to be escorted out by RN, to be driven home by family.  

## 2015-02-26 NOTE — Progress Notes (Signed)
ANTIBIOTIC CONSULT NOTE - FOLLOW UP  Pharmacy Consult for Vanc/Zosyn-> Cipro PO Indication: Pseudomonas UTI  No Known Allergies  Patient Measurements: Height: 6\' 2"  (188 cm) Weight: 156 lb (70.761 kg) IBW/kg (Calculated) : 82.2  Vital Signs: Temp: 98.3 F (36.8 C) (03/08 0554) Temp Source: Oral (03/08 0554) BP: 155/70 mmHg (03/08 0554) Pulse Rate: 53 (03/08 0554) Intake/Output from previous day: 03/07 0701 - 03/08 0700 In: 2174.2 [P.O.:120; I.V.:1804.2; IV Piggyback:250] Out: 1150 [Urine:1150] Intake/Output from this shift: Total I/O In: 360 [P.O.:360] Out: 600 [Urine:600]  Labs:  Recent Labs  02/24/15 0517 02/25/15 0845  WBC 20.8* 11.7*  HGB 8.3* 8.7*  PLT 282 338  CREATININE 1.02 0.96   Estimated Creatinine Clearance: 67.6 mL/min (by C-G formula based on Cr of 0.96).  Recent Labs  02/26/15 0838  VANCOTROUGH 10.7     Microbiology: Recent Results (from the past 720 hour(s))  Blood culture (routine x 2)     Status: None (Preliminary result)   Collection Time: 02/22/15  7:49 AM  Result Value Ref Range Status   Specimen Description BLOOD LEFT ANTECUBITAL  Final   Special Requests BOTTLES DRAWN AEROBIC AND ANAEROBIC 5CCS  Final   Culture   Final           BLOOD CULTURE RECEIVED NO GROWTH TO DATE CULTURE WILL BE HELD FOR 5 DAYS BEFORE ISSUING A FINAL NEGATIVE REPORT Performed at [facility]    Report Status PENDING  Incomplete  Blood culture (routine x 2)     Status: None (Preliminary result)   Collection Time: 02/22/15  7:53 AM  Result Value Ref Range Status   Specimen Description BLOOD RIGHT HAND  Final   Special Requests BOTTLES DRAWN AEROBIC AND ANAEROBIC 5CCS  Final   Culture   Final           BLOOD CULTURE RECEIVED NO GROWTH TO DATE CULTURE WILL BE HELD FOR 5 DAYS BEFORE ISSUING A FINAL NEGATIVE REPORT Performed at [facility]    Report Status PENDING  Incomplete  Urine culture     Status: None   Collection Time: 02/22/15  9:05  AM  Result Value Ref Range Status   Specimen Description URINE, RANDOM  Final   Special Requests NONE  Final   Colony Count   Final    >=100,000 COLONIES/ML Performed at [facility]    Culture   Final    PSEUDOMONAS AERUGINOSA Performed at [facility]    Report Status 02/24/2015 FINAL  Final   Organism ID, Bacteria PSEUDOMONAS AERUGINOSA  Final      Susceptibility   Pseudomonas aeruginosa - MIC*    CEFEPIME 4 SENSITIVE Sensitive     CEFTAZIDIME 4 SENSITIVE Sensitive     CIPROFLOXACIN 0.5 SENSITIVE Sensitive     GENTAMICIN 2 SENSITIVE Sensitive     IMIPENEM 2 SENSITIVE Sensitive     PIP/TAZO <=4 SENSITIVE Sensitive     TOBRAMYCIN <=1 SENSITIVE Sensitive     * PSEUDOMONAS AERUGINOSA   Assessment:   Day # 5 Vanc and Zosyn.  Refused Vanc trough last night.   Vanc trough 10.7 mcg/ml this am, but no longer needs Vanc.   Zosyn changing to oral Cipro for Pseudomonas in urine culture 02/22/15.  IV access lost, and planning discharge today.   POD# 2 left orchiectomy for epididymitis. Has suprapubic catheter, which is to be changed in the office by Urology after discharge.  Goal of Therapy:  Vancomycin trough level 15-20 mcg/ml appropriate  Cipro dose for renal function and infection  Plan:   CIpro 500 mg PO BID.    First dose prior to expected discharge today.  Arty Baumgartner, [place] Pager: [phone] 02/26/2015,12:14 PM

## 2015-02-26 NOTE — Progress Notes (Signed)
Pt's IV was leaking. D/c IV site. Pt refusing to be stuck to gain IV access at this time.  Pt states he is suppose to go home today. Will continue to monitor pt. Ranelle Oyster, RN (charge nurse)

## 2015-02-26 NOTE — Care Management Note (Signed)
    Page 1 of 1   02/26/2015     4:28:11 PM CARE MANAGEMENT NOTE 02/26/2015  Patient:  ARHAM, SYMMONDS   Account Number:  [id number]  Date Initiated:  02/22/2015  Documentation initiated by:  [facility]  Subjective/Objective Assessment:   35-yo man with hx of prostate cancer,  BPH requiring suprapubic cath placement  with cryotherapy of the prostate the same time. Presented to ED with pain around suprapubic cath, temp,  emesis.//Hm with spouse.     Action/Plan:      Plan admission to medical floor. Empiric antibiotics for epididymitis and orchitis. Urology consultation pending. Continue to monitor hemodynamics.     NPO for now, SCDs//Access for disposition needs   Anticipated DC Date:  02/26/2015   Anticipated DC Plan:  [place]  CM consult      Choice offered to / List presented to:             Status of service:  Completed, signed off Medicare Important Message given?  YES (If response is "NO", the following Medicare IM given date fields will be blank) Date Medicare IM given:  02/25/2015 Medicare IM given by:  Tomi Bamberger Date Additional Medicare IM given:   Additional Medicare IM given by:    Discharge Disposition:  HOME/SELF CARE  Per UR Regulation:  Reviewed for med. necessity/level of care/duration of stay  If discussed at [place] of Stay Meetings, dates discussed:    Comments:  02/26/15 [place], BSN [phone] patient for dc today, no needs.

## 2015-02-28 LAB — CULTURE, BLOOD (ROUTINE X 2)
CULTURE: NO GROWTH
Culture: NO GROWTH

## 2015-03-14 ENCOUNTER — Encounter: Payer: Self-pay | Admitting: Podiatry

## 2015-03-14 ENCOUNTER — Ambulatory Visit: Payer: Medicare Other | Admitting: Podiatry

## 2015-03-14 ENCOUNTER — Ambulatory Visit (INDEPENDENT_AMBULATORY_CARE_PROVIDER_SITE_OTHER): Payer: Medicare Other | Admitting: Podiatry

## 2015-03-14 VITALS — BP 118/81 | HR 76 | Resp 16

## 2015-03-14 DIAGNOSIS — G5792 Unspecified mononeuropathy of left lower limb: Secondary | ICD-10-CM

## 2015-03-14 NOTE — Progress Notes (Signed)
He presents today after 2 injections to the dorsal aspect of the fourth toe left foot of dehydrated alcohol. He states that really have not noticed any change.  Objective: Vital signs are stable he is alert and oriented 3. Pulses are palpable left foot. He continues to have pain on palpation of the fourth toe left foot.  Assessment: Neuritis fourth toe left.  Plan: Injected with dehydrated alcohol today third dose and will follow up with him in 3 weeks

## 2015-04-11 ENCOUNTER — Encounter: Payer: Self-pay | Admitting: Podiatry

## 2015-04-11 ENCOUNTER — Ambulatory Visit (INDEPENDENT_AMBULATORY_CARE_PROVIDER_SITE_OTHER): Payer: Medicare Other | Admitting: Podiatry

## 2015-04-11 VITALS — BP 148/85 | HR 78 | Resp 16

## 2015-04-11 DIAGNOSIS — G5792 Unspecified mononeuropathy of left lower limb: Secondary | ICD-10-CM | POA: Diagnosis not present

## 2015-04-16 NOTE — Progress Notes (Signed)
He presents today with continued pain in his third and fourth toes of his left foot.  Objective: Vital signs are stable alert and oriented 3. Pulses are palpable left foot. He has reproducible pain at the base of the third and fourth toes of the left foot. These are neuroma-type pains and he has neuritis in his neck arm left leg and left foot.  Assessment: Neuritis third and fourth toes left foot.  Plan: Dehydrated alcohol chemical injection destruction nerve left follow-up with him in 3 weeks.

## 2015-05-02 ENCOUNTER — Ambulatory Visit: Payer: Medicare Other | Admitting: Podiatry

## 2015-10-11 ENCOUNTER — Other Ambulatory Visit: Payer: Self-pay | Admitting: Orthopedic Surgery

## 2015-10-16 ENCOUNTER — Encounter (HOSPITAL_BASED_OUTPATIENT_CLINIC_OR_DEPARTMENT_OTHER): Payer: Self-pay | Admitting: *Deleted

## 2015-10-16 NOTE — Telephone Encounter (Signed)
Error

## 2015-10-17 ENCOUNTER — Other Ambulatory Visit: Payer: Self-pay

## 2015-10-17 ENCOUNTER — Encounter (HOSPITAL_BASED_OUTPATIENT_CLINIC_OR_DEPARTMENT_OTHER)
Admission: RE | Admit: 2015-10-17 | Discharge: 2015-10-17 | Disposition: A | Discharge status: Home / Self Care | Payer: Medicare Other | Source: Ambulatory Visit | Attending: Orthopedic Surgery | Admitting: Orthopedic Surgery

## 2015-10-17 DIAGNOSIS — K219 Gastro-esophageal reflux disease without esophagitis: Secondary | ICD-10-CM | POA: Diagnosis not present

## 2015-10-17 DIAGNOSIS — I2510 Atherosclerotic heart disease of native coronary artery without angina pectoris: Secondary | ICD-10-CM | POA: Diagnosis not present

## 2015-10-17 DIAGNOSIS — B079 Viral wart, unspecified: Secondary | ICD-10-CM | POA: Diagnosis not present

## 2015-10-17 DIAGNOSIS — J449 Chronic obstructive pulmonary disease, unspecified: Secondary | ICD-10-CM | POA: Diagnosis not present

## 2015-10-17 DIAGNOSIS — I10 Essential (primary) hypertension: Secondary | ICD-10-CM | POA: Diagnosis not present

## 2015-10-17 DIAGNOSIS — N401 Enlarged prostate with lower urinary tract symptoms: Secondary | ICD-10-CM | POA: Diagnosis not present

## 2015-10-17 DIAGNOSIS — F17210 Nicotine dependence, cigarettes, uncomplicated: Secondary | ICD-10-CM | POA: Diagnosis not present

## 2015-10-17 DIAGNOSIS — Z79899 Other long term (current) drug therapy: Secondary | ICD-10-CM | POA: Diagnosis not present

## 2015-10-17 DIAGNOSIS — Z8546 Personal history of malignant neoplasm of prostate: Secondary | ICD-10-CM | POA: Diagnosis not present

## 2015-10-17 DIAGNOSIS — E782 Mixed hyperlipidemia: Secondary | ICD-10-CM | POA: Diagnosis not present

## 2015-10-17 DIAGNOSIS — M1990 Unspecified osteoarthritis, unspecified site: Secondary | ICD-10-CM | POA: Diagnosis present

## 2015-10-17 DIAGNOSIS — E119 Type 2 diabetes mellitus without complications: Secondary | ICD-10-CM | POA: Diagnosis not present

## 2015-10-17 LAB — BASIC METABOLIC PANEL
ANION GAP: 9 (ref 5–15)
BUN: 17 mg/dL (ref 6–20)
CALCIUM: 9.5 mg/dL (ref 8.9–10.3)
CO2: 26 mmol/L (ref 22–32)
Chloride: 108 mmol/L (ref 101–111)
Creatinine, Ser: 0.94 mg/dL (ref 0.61–1.24)
Glucose, Bld: 104 mg/dL — ABNORMAL HIGH (ref 65–99)
POTASSIUM: 4.3 mmol/L (ref 3.5–5.1)
SODIUM: 143 mmol/L (ref 135–145)

## 2015-10-22 ENCOUNTER — Ambulatory Visit (HOSPITAL_BASED_OUTPATIENT_CLINIC_OR_DEPARTMENT_OTHER): Payer: Medicare Other | Admitting: Anesthesiology

## 2015-10-22 ENCOUNTER — Encounter (HOSPITAL_BASED_OUTPATIENT_CLINIC_OR_DEPARTMENT_OTHER)
Admission: RE | Disposition: A | Discharge status: Home / Self Care | Payer: Self-pay | Source: Ambulatory Visit | Attending: Orthopedic Surgery

## 2015-10-22 ENCOUNTER — Ambulatory Visit (HOSPITAL_BASED_OUTPATIENT_CLINIC_OR_DEPARTMENT_OTHER)
Admission: RE | Admit: 2015-10-22 | Discharge: 2015-10-22 | Disposition: A | Discharge status: Home / Self Care | Payer: Medicare Other | Source: Ambulatory Visit | Attending: Orthopedic Surgery | Admitting: Orthopedic Surgery

## 2015-10-22 ENCOUNTER — Encounter (HOSPITAL_BASED_OUTPATIENT_CLINIC_OR_DEPARTMENT_OTHER): Payer: Self-pay | Admitting: *Deleted

## 2015-10-22 DIAGNOSIS — I10 Essential (primary) hypertension: Secondary | ICD-10-CM | POA: Diagnosis not present

## 2015-10-22 DIAGNOSIS — J449 Chronic obstructive pulmonary disease, unspecified: Secondary | ICD-10-CM | POA: Insufficient documentation

## 2015-10-22 DIAGNOSIS — K219 Gastro-esophageal reflux disease without esophagitis: Secondary | ICD-10-CM | POA: Insufficient documentation

## 2015-10-22 DIAGNOSIS — M1990 Unspecified osteoarthritis, unspecified site: Secondary | ICD-10-CM | POA: Diagnosis not present

## 2015-10-22 DIAGNOSIS — E119 Type 2 diabetes mellitus without complications: Secondary | ICD-10-CM | POA: Insufficient documentation

## 2015-10-22 DIAGNOSIS — Z79899 Other long term (current) drug therapy: Secondary | ICD-10-CM | POA: Insufficient documentation

## 2015-10-22 DIAGNOSIS — B079 Viral wart, unspecified: Secondary | ICD-10-CM | POA: Insufficient documentation

## 2015-10-22 DIAGNOSIS — F17210 Nicotine dependence, cigarettes, uncomplicated: Secondary | ICD-10-CM | POA: Insufficient documentation

## 2015-10-22 DIAGNOSIS — I2510 Atherosclerotic heart disease of native coronary artery without angina pectoris: Secondary | ICD-10-CM | POA: Insufficient documentation

## 2015-10-22 DIAGNOSIS — Z8546 Personal history of malignant neoplasm of prostate: Secondary | ICD-10-CM | POA: Insufficient documentation

## 2015-10-22 DIAGNOSIS — E782 Mixed hyperlipidemia: Secondary | ICD-10-CM | POA: Diagnosis not present

## 2015-10-22 DIAGNOSIS — N401 Enlarged prostate with lower urinary tract symptoms: Secondary | ICD-10-CM | POA: Insufficient documentation

## 2015-10-22 HISTORY — PX: CARPECTOMY: SHX5004

## 2015-10-22 HISTORY — PX: MASS EXCISION: SHX2000

## 2015-10-22 SURGERY — CARPECTOMY
Anesthesia: Regional | Site: Wrist | Laterality: Left

## 2015-10-22 MED ORDER — CEFAZOLIN SODIUM-DEXTROSE 2-3 GM-% IV SOLR
INTRAVENOUS | Status: AC
  Filled 2015-10-22: qty 50

## 2015-10-22 MED ORDER — PROPOFOL 10 MG/ML IV BOLUS
INTRAVENOUS | Status: AC
  Filled 2015-10-22: qty 20

## 2015-10-22 MED ORDER — DEXAMETHASONE SODIUM PHOSPHATE 10 MG/ML IJ SOLN
INTRAMUSCULAR | Status: AC
  Filled 2015-10-22: qty 1

## 2015-10-22 MED ORDER — FENTANYL CITRATE (PF) 100 MCG/2ML IJ SOLN
50.0000 ug | INTRAMUSCULAR | Status: DC | PRN
  Administered 2015-10-22: 100 ug via INTRAVENOUS

## 2015-10-22 MED ORDER — FENTANYL CITRATE (PF) 100 MCG/2ML IJ SOLN
INTRAMUSCULAR | Status: AC
  Filled 2015-10-22: qty 2

## 2015-10-22 MED ORDER — ONDANSETRON HCL 4 MG/2ML IJ SOLN
INTRAMUSCULAR | Status: DC | PRN
  Administered 2015-10-22: 4 mg via INTRAVENOUS

## 2015-10-22 MED ORDER — MEPERIDINE HCL 25 MG/ML IJ SOLN: 6.2500 mg | INTRAMUSCULAR | Status: DC | PRN

## 2015-10-22 MED ORDER — LIDOCAINE HCL (CARDIAC) 20 MG/ML IV SOLN
INTRAVENOUS | Status: AC
  Filled 2015-10-22: qty 5

## 2015-10-22 MED ORDER — PROPOFOL 10 MG/ML IV BOLUS
INTRAVENOUS | Status: DC | PRN
  Administered 2015-10-22: 150 mg via INTRAVENOUS

## 2015-10-22 MED ORDER — LIDOCAINE HCL (CARDIAC) 20 MG/ML IV SOLN
INTRAVENOUS | Status: DC | PRN
  Administered 2015-10-22: 50 mg via INTRAVENOUS

## 2015-10-22 MED ORDER — CHLORHEXIDINE GLUCONATE 4 % EX LIQD: 60.0000 mL | Freq: Once | CUTANEOUS | Status: DC

## 2015-10-22 MED ORDER — SCOPOLAMINE 1 MG/3DAYS TD PT72: 1.0000 | MEDICATED_PATCH | Freq: Once | TRANSDERMAL | Status: DC | PRN

## 2015-10-22 MED ORDER — GLYCOPYRROLATE 0.2 MG/ML IJ SOLN
0.2000 mg | Freq: Once | INTRAMUSCULAR | Status: AC | PRN
  Administered 2015-10-22: 0.2 mg via INTRAVENOUS

## 2015-10-22 MED ORDER — LACTATED RINGERS IV SOLN
INTRAVENOUS | Status: DC
  Administered 2015-10-22 (×2): 10 mL/h via INTRAVENOUS

## 2015-10-22 MED ORDER — BUPIVACAINE-EPINEPHRINE (PF) 0.5% -1:200000 IJ SOLN
INTRAMUSCULAR | Status: DC | PRN
  Administered 2015-10-22: 25 mL via PERINEURAL

## 2015-10-22 MED ORDER — PHENYLEPHRINE 40 MCG/ML (10ML) SYRINGE FOR IV PUSH (FOR BLOOD PRESSURE SUPPORT)
PREFILLED_SYRINGE | INTRAVENOUS | Status: AC
  Filled 2015-10-22: qty 10

## 2015-10-22 MED ORDER — OXYCODONE-ACETAMINOPHEN 5-325 MG PO TABS: ORAL_TABLET | ORAL | Status: DC

## 2015-10-22 MED ORDER — MIDAZOLAM HCL 2 MG/2ML IJ SOLN
1.0000 mg | INTRAMUSCULAR | Status: DC | PRN
Start: 2015-10-22 — End: 2015-10-22
  Administered 2015-10-22: 1 mg via INTRAVENOUS

## 2015-10-22 MED ORDER — MIDAZOLAM HCL 2 MG/2ML IJ SOLN
INTRAMUSCULAR | Status: AC
  Filled 2015-10-22: qty 2

## 2015-10-22 MED ORDER — FENTANYL CITRATE (PF) 100 MCG/2ML IJ SOLN
25.0000 ug | INTRAMUSCULAR | Status: DC | PRN
  Administered 2015-10-22: 25 ug via INTRAVENOUS

## 2015-10-22 MED ORDER — PHENYLEPHRINE HCL 10 MG/ML IJ SOLN
INTRAMUSCULAR | Status: DC | PRN
  Administered 2015-10-22: 40 ug via INTRAVENOUS

## 2015-10-22 MED ORDER — DEXAMETHASONE SODIUM PHOSPHATE 4 MG/ML IJ SOLN
INTRAMUSCULAR | Status: DC | PRN
  Administered 2015-10-22: 10 mg via INTRAVENOUS

## 2015-10-22 MED ORDER — CEFAZOLIN SODIUM-DEXTROSE 2-3 GM-% IV SOLR
2.0000 g | INTRAVENOUS | Status: AC
  Administered 2015-10-22: 2 g via INTRAVENOUS

## 2015-10-22 MED ORDER — ONDANSETRON HCL 4 MG/2ML IJ SOLN
INTRAMUSCULAR | Status: AC
  Filled 2015-10-22: qty 2

## 2015-10-22 MED ORDER — GLYCOPYRROLATE 0.2 MG/ML IJ SOLN
INTRAMUSCULAR | Status: AC
  Filled 2015-10-22: qty 1

## 2015-10-22 SURGICAL SUPPLY — 68 items
BANDAGE ELASTIC 3 VELCRO ST LF (GAUZE/BANDAGES/DRESSINGS) ×4 IMPLANT
BANDAGE ELASTIC 4 VELCRO ST LF (GAUZE/BANDAGES/DRESSINGS) IMPLANT
BLADE ARTHRO LOK 4 BEAVER (BLADE) ×4 IMPLANT
BLADE ARTHRO LOK 4MM BEAVER (BLADE) ×2
BLADE EAR TYMPAN 2.5 60D BEAV (BLADE) ×4 IMPLANT
BLADE MINI RND TIP GREEN BEAV (BLADE) ×4 IMPLANT
BLADE SURG 15 STRL LF DISP TIS (BLADE) ×4 IMPLANT
BLADE SURG 15 STRL SS (BLADE) ×8
BNDG CMPR 9X4 STRL LF SNTH (GAUZE/BANDAGES/DRESSINGS) ×2
BNDG ESMARK 4X9 LF (GAUZE/BANDAGES/DRESSINGS) ×4 IMPLANT
BNDG GAUZE ELAST 4 BULKY (GAUZE/BANDAGES/DRESSINGS) ×4 IMPLANT
CHLORAPREP W/TINT 26ML (MISCELLANEOUS) ×4 IMPLANT
CORDS BIPOLAR (ELECTRODE) ×4 IMPLANT
COVER BACK TABLE 60X90IN (DRAPES) ×4 IMPLANT
COVER MAYO STAND STRL (DRAPES) ×4 IMPLANT
CUFF TOURNIQUET SINGLE 18IN (TOURNIQUET CUFF) ×2 IMPLANT
DECANTER SPIKE VIAL GLASS SM (MISCELLANEOUS) IMPLANT
DRAPE EXTREMITY T 121X128X90 (DRAPE) ×4 IMPLANT
DRAPE INCISE IOBAN 66X45 STRL (DRAPES) IMPLANT
DRAPE LAPAROTOMY 100X72 PEDS (DRAPES) IMPLANT
DRAPE OEC MINIVIEW 54X84 (DRAPES) ×4 IMPLANT
DRAPE SURG 17X23 STRL (DRAPES) ×20 IMPLANT
GAUZE SPONGE 4X4 12PLY STRL (GAUZE/BANDAGES/DRESSINGS) ×4 IMPLANT
GAUZE XEROFORM 1X8 LF (GAUZE/BANDAGES/DRESSINGS) ×4 IMPLANT
GLOVE BIO SURGEON STRL SZ7.5 (GLOVE) ×4 IMPLANT
GLOVE BIOGEL PI IND STRL 8 (GLOVE) ×2 IMPLANT
GLOVE BIOGEL PI IND STRL 8.5 (GLOVE) ×2 IMPLANT
GLOVE BIOGEL PI INDICATOR 8 (GLOVE) ×2
GLOVE BIOGEL PI INDICATOR 8.5 (GLOVE) ×2
GLOVE SURG ORTHO 8.0 STRL STRW (GLOVE) ×4 IMPLANT
GOWN STRL REUS W/ TWL LRG LVL3 (GOWN DISPOSABLE) ×2 IMPLANT
GOWN STRL REUS W/TWL LRG LVL3 (GOWN DISPOSABLE) ×4
GOWN STRL REUS W/TWL XL LVL3 (GOWN DISPOSABLE) ×8 IMPLANT
NDL HYPO 25X1 1.5 SAFETY (NEEDLE) IMPLANT
NEEDLE HYPO 25X1 1.5 SAFETY (NEEDLE) IMPLANT
NS IRRIG 1000ML POUR BTL (IV SOLUTION) ×4 IMPLANT
PACK BASIN DAY SURGERY FS (CUSTOM PROCEDURE TRAY) ×4 IMPLANT
PAD CAST 3X4 CTTN HI CHSV (CAST SUPPLIES) ×2 IMPLANT
PAD CAST 4YDX4 CTTN HI CHSV (CAST SUPPLIES) ×2 IMPLANT
PADDING CAST ABS 3INX4YD NS (CAST SUPPLIES)
PADDING CAST ABS 4INX4YD NS (CAST SUPPLIES) ×2
PADDING CAST ABS COTTON 3X4 (CAST SUPPLIES) IMPLANT
PADDING CAST ABS COTTON 4X4 ST (CAST SUPPLIES) ×2 IMPLANT
PADDING CAST COTTON 3X4 STRL (CAST SUPPLIES) ×4
PADDING CAST COTTON 4X4 STRL (CAST SUPPLIES) ×4
SLEEVE SCD COMPRESS KNEE MED (MISCELLANEOUS) ×4 IMPLANT
SPLINT PLASTER CAST XFAST 3X15 (CAST SUPPLIES) ×20 IMPLANT
SPLINT PLASTER XTRA FASTSET 3X (CAST SUPPLIES) ×20
STOCKINETTE 4X48 STRL (DRAPES) ×4 IMPLANT
SUCTION FRAZIER TIP 10 FR DISP (SUCTIONS) IMPLANT
SUT ETHILON 3 0 PS 1 (SUTURE) IMPLANT
SUT ETHILON 4 0 PS 2 18 (SUTURE) ×8 IMPLANT
SUT MERSILENE 3 0 FS 1 (SUTURE) ×4 IMPLANT
SUT VIC AB 0 CT1 27 (SUTURE)
SUT VIC AB 0 CT1 27XBRD ANBCTR (SUTURE) IMPLANT
SUT VIC AB 2-0 SH 18 (SUTURE) IMPLANT
SUT VIC AB 2-0 SH 27 (SUTURE) ×4
SUT VIC AB 2-0 SH 27XBRD (SUTURE) IMPLANT
SUT VIC AB 3-0 PS1 18 (SUTURE)
SUT VIC AB 3-0 PS1 18XBRD (SUTURE) IMPLANT
SUT VICRYL 4-0 PS2 18IN ABS (SUTURE) ×4 IMPLANT
SYR BULB 3OZ (MISCELLANEOUS) ×4 IMPLANT
SYR CONTROL 10ML LL (SYRINGE) IMPLANT
TOWEL OR 17X24 6PK STRL BLUE (TOWEL DISPOSABLE) ×8 IMPLANT
TOWEL OR NON WOVEN STRL DISP B (DISPOSABLE) ×4 IMPLANT
TUBE CONNECTING 20'X1/4 (TUBING)
TUBE CONNECTING 20X1/4 (TUBING) IMPLANT
UNDERPAD 30X30 (UNDERPADS AND DIAPERS) ×4 IMPLANT

## 2015-10-22 NOTE — Discharge Instructions (Addendum)
[facility] Instructions Hand Surgery  Wound Care: Keep your hand elevated above the level of your heart.  Do not allow it to dangle by your side.  Keep the dressing dry and do not remove it unless your doctor advises you to do so.  He will usually change it at the time of your post-op visit.  Moving your fingers is advised to stimulate circulation but will depend on the site of your surgery.  If you have a splint applied, your doctor will advise you regarding movement.  Activity: Do not drive or operate machinery today.  Rest today and then you may return to your normal activity and work as indicated by your physician.  Diet:  Drink liquids today or eat a light diet.  You may resume a regular diet tomorrow.    General expectations: Pain for two to three days. Fingers may become slightly swollen.  Call your doctor if any of the following occur: Severe pain not relieved by pain medication. Elevated temperature. Dressing soaked with blood. Inability to move fingers. White or bluish color to fingers.     Post Anesthesia Home Care Instructions  Activity: Get plenty of rest for the remainder of the day. A responsible adult should stay with you for 24 hours following the procedure.  For the next 24 hours, DO NOT: -Drive a car -Paediatric nurse -Drink alcoholic beverages -Take any medication unless instructed by your physician -Make any legal decisions or sign important papers.  Meals: Start with liquid foods such as gelatin or soup. Progress to regular foods as tolerated. Avoid greasy, spicy, heavy foods. If nausea and/or vomiting occur, drink only clear liquids until the nausea and/or vomiting subsides. Call your physician if vomiting continues.  Special Instructions/Symptoms: Your throat may feel dry or sore from the anesthesia or the breathing tube placed in your throat during surgery. If this causes discomfort, gargle with warm salt water. The discomfort should disappear  within 24 hours.  If you had a scopolamine patch placed behind your ear for the management of post- operative nausea and/or vomiting:  1. The medication in the patch is effective for 72 hours, after which it should be removed.  Wrap patch in a tissue and discard in the trash. Wash hands thoroughly with soap and water. 2. You may remove the patch earlier than 72 hours if you experience unpleasant side effects which may include dry mouth, dizziness or visual disturbances. 3. Avoid touching the patch. Wash your hands with soap and water after contact with the patch.   [place] - Preparing for Surgery  Before surgery, you can play an important role.  Because skin is not sterile, your skin needs to be as free of germs as possible.  You can reduce the number of germs on you skin by washing with CHG (chlorahexidine gluconate) soap before surgery.  CHG is an antiseptic cleaner which kills germs and bonds with the skin to continue killing germs even after washing.  Please DO NOT use if you have an allergy to CHG or antibacterial soaps.  If your skin becomes reddened/irritated stop using the CHG and inform your nurse when you arrive at Short Stay.  Do not shave (including legs and underarms) for at least 48 hours prior to the first CHG shower.  You may shave your face.  Please follow these instructions carefully:   1.  Shower with CHG Soap the night before surgery and the  morning of Surgery.  2.  If you choose to wash your hair, wash your hair first as usual with your       normal shampoo.  3.  After you shampoo, rinse your hair and body thoroughly to remove the                      Shampoo.  4.  Use CHG as you would any other liquid soap.  You can apply chg directly       to the skin and wash gently with scrungie or a clean washcloth.  5.  Apply the CHG Soap to your body ONLY FROM THE NECK DOWN.        Do not use on open wounds or open sores.  Avoid contact with your  eyes,       ears, mouth and genitals (private parts).  Wash genitals (private parts)       with your normal soap.  6.  Wash thoroughly, paying special attention to the area where your surgery        will be performed.  7.  Thoroughly rinse your body with warm water from the neck down.  8.  DO NOT shower/wash with your normal soap after using and rinsing off       the CHG Soap.  9.  Pat yourself dry with a clean towel.            10.  Wear clean pajamas.            11.  Place clean sheets on your bed the night of your first shower and do not        sleep with pets.  Day of Surgery  Do not apply any lotions/deoderants the morning of surgery.  Please wear clean clothes to the hospital/surgery center.    Call your surgeon if you experience:   1.  Fever over 101.0. 2.  Inability to urinate. 3.  Nausea and/or vomiting. 4.  Extreme swelling or bruising at the surgical site. 5.  Continued bleeding from the incision. 6.  Increased pain, redness or drainage from the incision. 7.  Problems related to your pain medication. 8. Any change in color, movement and/or sensation 9. Any problems and/or concerns

## 2015-10-22 NOTE — Progress Notes (Signed)
Assisted Dr. Crews with left, ultrasound guided, infraclavicular block. Side rails up, monitors on throughout procedure. See vital signs in flow sheet. Tolerated Procedure well. 

## 2015-10-22 NOTE — Anesthesia Procedure Notes (Addendum)
Anesthesia Regional Block:  Infraclavicular brachial plexus block  Pre-Anesthetic Checklist: ,, timeout performed, Correct Patient, Correct Site, Correct Laterality, Correct Procedure, Correct Position, site marked, Risks and benefits discussed,  Surgical consent,  Pre-op evaluation,  At surgeon's request and post-op pain management  Laterality: Left and Upper  Prep: chloraprep       Needles:  Injection technique: Single-shot  Needle Type: Echogenic Stimulator Needle     Needle Length: 5cm 5 cm Needle Gauge: 21 and 21 G    Additional Needles:  Procedures: ultrasound guided (picture in chart) Infraclavicular brachial plexus block Narrative:  Start time: 10/22/2015 12:05 PM End time: 10/22/2015 12:12 PM Injection made incrementally with aspirations every 5 mL.  Performed by: Personally  Anesthesiologist: CREWS, DAVID   Procedure Name: LMA Insertion Date/Time: 10/22/2015 1:22 PM Performed by: Lyndee Leo Pre-anesthesia Checklist: Patient identified, Emergency Drugs available, Suction available and Patient being monitored Patient Re-evaluated:Patient Re-evaluated prior to inductionOxygen Delivery Method: Circle System Utilized Preoxygenation: Pre-oxygenation with 100% oxygen Intubation Type: IV induction Ventilation: Mask ventilation without difficulty and Oral airway inserted - appropriate to patient size LMA: LMA inserted LMA Size: 4.0 Number of attempts: 1 Airway Equipment and Method: Bite block Placement Confirmation: positive ETCO2 Tube secured with: Tape Dental Injury: Teeth and Oropharynx as per pre-operative assessment       Left Infraclavicular block image

## 2015-10-22 NOTE — Op Note (Signed)
[id number] 

## 2015-10-22 NOTE — H&P (Signed)
Russell [name] is an 74 y.o. male.   Chief Complaint: left wrist pain and index finger mass HPI: 74 yo male with many years of pain in bilateral wrists.  He has tried splinting, medication, and injections without relief.  He wishes to have a proximal row carpectomy vs four corner fusion for management of symptoms.  He also has a mass on the left index finger that he requests be removed at the same time.  Past Medical History  Diagnosis Date  . Mitral regurgitation   . Glaucoma   . Depression   . Hypertension   . Hyperlipidemia, mixed   . History of syncope     03-22-2014  DX  VAGAL RESPONSE  . Coronary atherosclerosis   . COPD (chronic obstructive pulmonary disease) ([place])   . GERD (gastroesophageal reflux disease)   . Diverticulosis of colon   . History of gastritis   . Prostate cancer ([place])   . BPH with obstruction/lower urinary tract symptoms   . ED (erectile dysfunction)   . Arthritis     SHOUDLERS, LUMBAR  . 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 History of scabies     2008  . Type 2 diabetes, diet controlled ([place])   . Wears glasses   . Full dentures     Past Surgical History  Procedure Laterality Date  . Knee arthroscopy Right 2008  . Capsulotomy Right 01/26/2013    Procedure: MINOR CAPSULOTOMY;  Surgeon: Myrtha Mantis., MD;  Location: [place];  Service: Ophthalmology;  Laterality: Right;  . Yag laser application Right 12/26/1094    Procedure: YAG LASER APPLICATION;  Surgeon: Myrtha Mantis., MD;  Location: [place];  Service: Ophthalmology;  Laterality: Right;  . I&d extremity Right 05/24/2013    Procedure: RIGHT INDEX WOUND DEBRIDEMENT AND CLOSURE;  Surgeon: Jolyn Nap, MD;  Location: [place];  Service: Orthopedics;  Laterality: Right;  . Foot surgery Left   . Carpectomy Right 03/13/2014    Procedure: RIGHT  PROXIMAL ROW CARPECTOMY;  Surgeon: Tennis Must, MD;  Location: [place];  Service:  Orthopedics;  Laterality: Right;  . Carpometacarpal (cmc) fusion of thumb Right 03/13/2014    Procedure: RIGHT FUSION OF THUMB CARPOMETACARPAL [facility]) JOINT;  Surgeon: Tennis Must, MD;  Location: [place];  Service: Orthopedics;  Laterality: Right;  . Cataract extraction w/ intraocular lens  implant, bilateral  2009  . Yag laser capsulotomy, left eye  01-08-2011  . Anterior cervical decomp/discectomy fusion  10-16-2009    C4 -- C6  . Excisional debridement and repair right quadricep tendon  07-05-2000  . Laryngoscopy and esophagoscopy  06-13-2010    MARSUPIALIZATION LEFT LARGE VALLECULAR CYST  . Excision mass left face , suborbital area, plaster reconstruction  02-09-2011  . Shoulder arthroscopy/ debridement labral tear/  biceps tenotomy Left 09-24-2011  . Colonoscopy  08-31-2007  . Esophagogastroduodenoscopy  last one 09-11-2008  . Transthoracic echocardiogram  02-21-2010    normal LVF/  ef 55-60%/ mild to moderate MR/  moderate LAE/  mild TR  . Tonsillectomy  as child  . Cryoablation N/A 01/14/2015    Procedure: CRYO ABLATION PROSTATE;  Surgeon: Ailene Rud, MD;  Location: [facility];  Service: Urology;  Laterality: N/A;  . Insertion of suprapubic catheter N/A 01/14/2015    Procedure: SUPRAPUBIC TUBE PLACEMENT;  Surgeon: Ailene Rud, MD;  Location: [place];  Service: Urology;  Laterality: N/A;  . Orchiectomy Left 02/24/2015    Procedure: SCROTAL EXPLORATION WITH LEFT ORCHIECTOMY;  Surgeon: Kathie Rhodes, MD;  Location: [place];  Service: Urology;  Laterality: Left;    Family History  Problem Relation Age of Onset  . Diabetes    . Cancer     Social History:  reports that he has been smoking Cigarettes.  He has a 51 pack-year smoking history. He has never used smokeless tobacco. He reports that he does not drink alcohol or use illicit drugs.  Allergies: No Known Allergies  Medications Prior to Admission  Medication Sig  Dispense Refill  . atorvastatin (LIPITOR) 80 MG tablet Take 80 mg by mouth daily.    . bimatoprost (LUMIGAN) 0.01 % SOLN Place 1 drop into both eyes at bedtime. 30 mL 3  . brimonidine-timolol (COMBIGAN) 0.2-0.5 % ophthalmic solution Place 1 drop into both eyes 2 (two) times daily.     . dorzolamide (TRUSOPT) 2 % ophthalmic solution Place 1 drop into the left eye 3 (three) times daily.     Marland Kitchen esomeprazole (NEXIUM) 40 MG capsule Take 40 mg by mouth every morning.     Marland Kitchen losartan (COZAAR) 100 MG tablet Take 1 tablet (100 mg total) by mouth daily. (Patient taking differently: Take 100 mg by mouth every morning. ) 30 tablet 3  . Multiple Vitamins-Minerals (CENTRUM SILVER ADULT 50+ PO) Take 1 tablet by mouth daily.    . Omega-3 Fatty Acids (FISH OIL) 1000 MG CAPS Take 1 capsule by mouth every morning.     Marland Kitchen oxybutynin (DITROPAN-XL) 10 MG 24 hr tablet Take 10 mg by mouth at bedtime.    . tamsulosin (FLOMAX) 0.4 MG CAPS capsule Take 0.4 mg by mouth every morning.     Marland Kitchen albuterol (PROVENTIL HFA;VENTOLIN HFA) 108 (90 BASE) MCG/ACT inhaler Inhale 2 puffs into the lungs every 6 (six) hours as needed for wheezing or shortness of breath.      No results found for this or any previous visit (from the past 48 hour(s)).  No results found.   A comprehensive review of systems was negative.  Blood pressure 167/74, pulse 48, temperature 97.7 F (36.5 C), temperature source Oral, resp. rate 15, height 6\' 2"  (1.88 m), weight 74.299 kg (163 lb 12.8 oz), SpO2 100 %.  General appearance: alert, cooperative and appears stated age Head: Normocephalic, without obvious abnormality, atraumatic Neck: supple, symmetrical, trachea midline Resp: clear to auscultation bilaterally Cardio: regular rate and rhythm GI: non tender Extremities: intact sensation and capillary refill all digits.  +epl/fpl/io.  no wounds.  mass at radial side of left index. Pulses: 2+ and symmetric Skin: Skin color, texture, turgor normal. No  rashes or lesions Neurologic: Grossly normal Incision/Wound: none  Assessment/Plan Left SLAC wrist and index finger mass.  Non operative and operative treatment options were discussed with the patient and patient wishes to proceed with operative treatment. Risks, benefits, and alternatives of surgery were discussed and the patient agrees with the plan of care.   Madigan Rosensteel R 10/22/2015, 1:08 PM

## 2015-10-22 NOTE — Brief Op Note (Signed)
10/22/2015  2:43 PM  PATIENT:  Russell [name]  74 y.o. 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  PROCEDURE:  Procedure(s): LEFT WRIST PROXIMAL ROW CARPECTOMY  (Left) EXCISION MASS OF LEFT INDEX FINGER (Left)  SURGEON:  Surgeon(s) and Role:    * Leanora Cover, MD - Primary    * Daryll Brod, MD - Assisting  PHYSICIAN ASSISTANT:   ASSISTANTS: Daryll Brod, MD   ANESTHESIA:   regional and general  EBL:     BLOOD ADMINISTERED:none  DRAINS: none   LOCAL MEDICATIONS USED:  NONE  SPECIMEN:  Source of Specimen:  left index finger  DISPOSITION OF SPECIMEN:  PATHOLOGY  COUNTS:  YES  TOURNIQUET:   Total Tourniquet Time Documented: Upper Arm (Left) - 63 minutes Total: Upper Arm (Left) - 63 minutes   DICTATION: .Other Dictation: Dictation Number [id number]  PLAN OF CARE: Discharge to home after PACU  PATIENT DISPOSITION:  PACU - hemodynamically stable.

## 2015-10-22 NOTE — Anesthesia Preprocedure Evaluation (Addendum)
Anesthesia Evaluation  Patient identified by MRN, date of birth, ID band Patient awake    Reviewed: Allergy & Precautions, NPO status , Patient's Chart, lab work & pertinent test results  Airway Mallampati: I  TM Distance: >3 FB Neck ROM: Full    Dental  (+) Teeth Intact, Dental Advisory Given   Pulmonary COPD, Current Smoker,    breath sounds clear to auscultation       Cardiovascular hypertension, Pt. on medications + CAD  PERIPHERAL VASCULAR DISEASE:       Rhythm:Regular Rate:Normal     Neuro/Psych    GI/Hepatic GERD  ,  Endo/Other  diabetes, Well Controlled, Type 2  Renal/GU      Musculoskeletal   Abdominal   Peds  Hematology   Anesthesia Other Findings   Reproductive/Obstetrics                            Anesthesia Physical Anesthesia Plan  ASA: III  Anesthesia Plan: General and Regional   Post-op Pain Management:    Induction: Intravenous  Airway Management Planned: LMA  Additional Equipment:   Intra-op Plan:   Post-operative Plan: Extubation in OR  Informed Consent: I have reviewed the patients History and Physical, chart, labs and discussed the procedure including the risks, benefits and alternatives for the proposed anesthesia with the patient or authorized representative who has indicated his/her understanding and acceptance.   Dental advisory given  Plan Discussed with: CRNA, Anesthesiologist and Surgeon  Anesthesia Plan Comments:         Anesthesia Quick Evaluation

## 2015-10-22 NOTE — Op Note (Signed)
Intra-operative fluoroscopic images in the AP, lateral, and oblique views were taken and evaluated by myself.  Reduction and hardware placement were confirmed.  There was no intraarticular penetration of permanent hardware.  

## 2015-10-22 NOTE — Transfer of Care (Signed)
Immediate Anesthesia Transfer of Care Note  Patient: Russell [name]  Procedure(s) Performed: Procedure(s): LEFT WRIST PROXIMAL ROW CARPECTOMY  (Left) EXCISION MASS OF LEFT INDEX FINGER (Left)  Patient Location: PACU  Anesthesia Type:GA combined with regional for post-op pain  Level of Consciousness: awake, sedated and patient cooperative  Airway & Oxygen Therapy: Patient Spontanous Breathing and Patient connected to face mask oxygen  Post-op Assessment: Report given to RN and Post -op Vital signs reviewed and stable  Post vital signs: Reviewed and stable  Last Vitals:  Filed Vitals:   10/22/15 1444  BP:   Pulse: 62  Temp:   Resp: 8    Complications: No apparent anesthesia complications

## 2015-10-23 ENCOUNTER — Encounter (HOSPITAL_BASED_OUTPATIENT_CLINIC_OR_DEPARTMENT_OTHER): Payer: Self-pay | Admitting: Orthopedic Surgery

## 2015-10-23 NOTE — Op Note (Signed)
NAME:  JOCOB, DAMBACH NO.:  [id number]  MEDICAL RECORD NO.:  [id number]  LOCATION:                                 FACILITY:  PHYSICIAN:  Leanora Cover, MD             DATE OF BIRTH:  DATE OF PROCEDURE:  10/22/2015 DATE OF DISCHARGE:                              OPERATIVE REPORT   PREOPERATIVE DIAGNOSES:  Left radiocarpal arthritis and index finger mass.  POSTOPERATIVE DIAGNOSES:  Left radiocarpal arthritis and index finger mass.  PROCEDURE:   1. Left wrist proximal row carpectomy  2. Excision of skin lesion of left index finger.  SURGEON:  Leanora Cover, MD  ASSISTANT:  Daryll Brod, M.D.  ANESTHESIA:  General with regional.  IV FLUIDS:  Per anesthesia flow sheet.  ESTIMATED BLOOD LOSS:  Minimal.  COMPLICATIONS:  None.  SPECIMENS:  None.  TOURNIQUET TIME:  63 minutes.  DISPOSITION:  Stable to PACU.  INDICATIONS:  Mr. Minnehan is a 74 year old male who has had pain in his left wrist for many years.  He has tried injections, splinting and anti- inflammatories without lasting relief.  He wished to have a proximal row carpectomy versus four-corner fusion for management of the symptoms. Risks, benefits and alternatives of the surgery were discussed including the risk of blood loss; infection; damage to nerves, vessels, tendons, ligaments, bone; failure of surgery; need for additional surgery; complications with wound healing; continued pain; instability and need for further surgeries.  He voiced understanding of these risks and elected to proceed.  He also wished to have a mass removed from his left index finger at the same time.  OPERATIVE COURSE:  After being identified preoperatively by myself, the patient and I agreed upon the procedure and site of procedure.  Sterile site was marked.  The risks, benefits, and alternatives of the surgery were reviewed and he wished to proceed.  Surgical consent had been signed.  He was given IV Ancef as  preoperative antibiotic prophylaxis. He was transferred to the operating room and placed on the operating room table in supine position with left upper extremity on an armboard. General anesthesia was induced by Anesthesiology.  A regional block had been performed by Anesthesia in preoperative holding.  Left upper extremity was prepped and draped in normal sterile orthopedic fashion. A surgical pause was performed between the surgeons, anesthesia, and operating room staff, and all were in agreement as to the patient, procedure, and site of procedure.  Tourniquet at the proximal aspect of the extremity was inflated to 250 mmHg after exsanguination of the limb with an Esmarch bandage.  The mass on the index finger was addressed first.  This was at the proximal finger flexion crease more toward the radial side.  It was ellipsed out.  It was approximately 4-5 mm in diameter.  The skin incision itself was approximately 8 mm in length. The wound was then undermined and closed with 4-0 nylon in horizontal mattress fashion.  This provided good apposition of skin edges. Attention was turned to the wrist.  Incision was made at the dorsum of the wrist and carried into subcutaneous tissues by spreading technique. Bipolar electrocautery was used  to obtain hemostasis.  The extensor retinaculum was incised.  The EPL was transposed.  A capsular incision was made.  This was then elevated.  The scaphoid was identified.  There was eburnation of the scaphoid and the scaphoid facet.  The scaphoid was removed in its entirety using a combination of the knife, blade, curette and rongeurs.  The triquetrum was then removed again with the knife, blade, curette and rongeurs.  The lunate was removed in its entirety. The proximal pole of the capitate had adequate articular cartilage as did the lunate fossa.  The capitate fell into the lunate fossa appropriately.  C-arm was used in AP and lateral projections to  ensure appropriate positioning, which was the case.  The wound was copiously irrigated with sterile saline.  The capsule was repaired with 2-0 Vicryl suture in a figure-of-eight fashion.  The extensor retinaculum was repaired with 4-0 Vicryl suture in a figure-of-eight fashion.  The EPL was left transposed.  Two inverted interrupted Vicryl sutures were placed in the subcutaneous tissues and the skin was closed with 4-0 nylon in horizontal mattress fashion.  The wound was then dressed with sterile Xeroform, 4x4s, and wrapped with Kerlix bandage.  A volar and dorsal slab splint was placed and wrapped with Kerlix and Ace bandage. Radiographs were taken through the splint in both AP and lateral projections, showed appropriate position of the capitate and the lunate facet.  The tourniquet was deflated at 63 minutes.  Fingertips were pink with brisk capillary refill after deflation of the tourniquet.  The operative drapes were broken down and the patient was awoken from anesthesia safely.  He was transferred back to stretcher and taken to PACU in stable condition.  I will see him back in the office in 1 week for postoperative followup.  I will give him Percocet 5/325, 1-2 p.o. q.6 hours p.r.n. pain, dispense #40.     Leanora Cover, MD     KK/MEDQ  D:  10/22/2015  T:  10/23/2015  Job:  [id number]

## 2015-10-23 NOTE — Anesthesia Postprocedure Evaluation (Signed)
  Anesthesia Post-op Note  Patient: Russell [name]  Procedure(s) Performed: Procedure(s): LEFT WRIST PROXIMAL ROW CARPECTOMY  (Left) EXCISION MASS OF LEFT INDEX FINGER (Left)  Patient Location: PACU  Anesthesia Type: General, Regional   Level of Consciousness: awake, alert  and oriented  Airway and Oxygen Therapy: Patient Spontanous Breathing  Post-op Pain: mild  Post-op Assessment: Post-op Vital signs reviewed  Post-op Vital Signs: Reviewed  Last Vitals:  Filed Vitals:   10/22/15 1600  BP: 183/85  Pulse: 56  Temp: 36.7 C  Resp: 20    Complications: No apparent anesthesia complications

## 2015-11-05 ENCOUNTER — Ambulatory Visit (INDEPENDENT_AMBULATORY_CARE_PROVIDER_SITE_OTHER): Payer: Medicare Other | Admitting: Podiatry

## 2015-11-05 ENCOUNTER — Encounter: Payer: Self-pay | Admitting: Podiatry

## 2015-11-05 VITALS — BP 172/95 | HR 68 | Resp 16

## 2015-11-05 DIAGNOSIS — G5792 Unspecified mononeuropathy of left lower limb: Secondary | ICD-10-CM

## 2015-11-05 DIAGNOSIS — G5762 Lesion of plantar nerve, left lower limb: Secondary | ICD-10-CM

## 2015-11-05 NOTE — Progress Notes (Signed)
He presents today for follow-up of pain to his left foot. He states that between these 2 toes, as he points to the third and fourth digits of the left foot he has moderate to severe pain.  Objective: Vital signs are stable he is alert and oriented 3 palpable pain between the third and fourth metatarsals of the left foot. No reproducible pain on palpation anywhere else of the foot.  Assessment: Neuroma third interdigital space left foot.  Plan: Injected dehydrated alcohol left foot. Follow up with him 1 month consider surgical excision neuroma.

## 2015-11-13 DIAGNOSIS — M19139 Post-traumatic osteoarthritis, unspecified wrist: Secondary | ICD-10-CM | POA: Insufficient documentation

## 2015-12-03 ENCOUNTER — Encounter (HOSPITAL_COMMUNITY): Payer: Self-pay | Admitting: Neurology

## 2015-12-03 ENCOUNTER — Emergency Department (HOSPITAL_COMMUNITY): Payer: Medicare Other

## 2015-12-03 ENCOUNTER — Emergency Department (HOSPITAL_COMMUNITY)
Admission: EM | Admit: 2015-12-03 | Discharge: 2015-12-03 | Disposition: A | Discharge status: Home / Self Care | Payer: Medicare Other | Attending: Emergency Medicine | Admitting: Emergency Medicine

## 2015-12-03 DIAGNOSIS — J449 Chronic obstructive pulmonary disease, unspecified: Secondary | ICD-10-CM | POA: Insufficient documentation

## 2015-12-03 DIAGNOSIS — J440 Chronic obstructive pulmonary disease with acute lower respiratory infection: Secondary | ICD-10-CM | POA: Insufficient documentation

## 2015-12-03 DIAGNOSIS — E782 Mixed hyperlipidemia: Secondary | ICD-10-CM | POA: Diagnosis not present

## 2015-12-03 DIAGNOSIS — Z8546 Personal history of malignant neoplasm of prostate: Secondary | ICD-10-CM | POA: Diagnosis not present

## 2015-12-03 DIAGNOSIS — I10 Essential (primary) hypertension: Secondary | ICD-10-CM | POA: Diagnosis not present

## 2015-12-03 DIAGNOSIS — Z8659 Personal history of other mental and behavioral disorders: Secondary | ICD-10-CM | POA: Diagnosis not present

## 2015-12-03 DIAGNOSIS — N400 Enlarged prostate without lower urinary tract symptoms: Secondary | ICD-10-CM | POA: Insufficient documentation

## 2015-12-03 DIAGNOSIS — R51 Headache: Secondary | ICD-10-CM | POA: Diagnosis not present

## 2015-12-03 DIAGNOSIS — H409 Unspecified glaucoma: Secondary | ICD-10-CM | POA: Diagnosis not present

## 2015-12-03 DIAGNOSIS — Z8619 Personal history of other infectious and parasitic diseases: Secondary | ICD-10-CM | POA: Diagnosis not present

## 2015-12-03 DIAGNOSIS — M159 Polyosteoarthritis, unspecified: Secondary | ICD-10-CM | POA: Diagnosis not present

## 2015-12-03 DIAGNOSIS — I2510 Atherosclerotic heart disease of native coronary artery without angina pectoris: Secondary | ICD-10-CM | POA: Insufficient documentation

## 2015-12-03 DIAGNOSIS — R05 Cough: Secondary | ICD-10-CM | POA: Diagnosis present

## 2015-12-03 DIAGNOSIS — Z79899 Other long term (current) drug therapy: Secondary | ICD-10-CM | POA: Diagnosis not present

## 2015-12-03 DIAGNOSIS — Z972 Presence of dental prosthetic device (complete) (partial): Secondary | ICD-10-CM | POA: Diagnosis not present

## 2015-12-03 DIAGNOSIS — K219 Gastro-esophageal reflux disease without esophagitis: Secondary | ICD-10-CM | POA: Diagnosis not present

## 2015-12-03 DIAGNOSIS — J40 Bronchitis, not specified as acute or chronic: Secondary | ICD-10-CM

## 2015-12-03 DIAGNOSIS — F17210 Nicotine dependence, cigarettes, uncomplicated: Secondary | ICD-10-CM | POA: Diagnosis not present

## 2015-12-03 DIAGNOSIS — E119 Type 2 diabetes mellitus without complications: Secondary | ICD-10-CM | POA: Diagnosis not present

## 2015-12-03 MED ORDER — AZITHROMYCIN 250 MG PO TABS
500.0000 mg | ORAL_TABLET | Freq: Once | ORAL | Status: AC
  Administered 2015-12-03: 500 mg via ORAL
  Filled 2015-12-03: qty 2

## 2015-12-03 MED ORDER — AZITHROMYCIN 250 MG PO TABS: 250.0000 mg | ORAL_TABLET | Freq: Every day | ORAL | Status: DC

## 2015-12-03 NOTE — Discharge Instructions (Signed)
You were evaluated in the ED today for your cough. There is not appear to be an emergent cause for your symptoms at this time he'll be treated for your upper central infection with a short course of antibiotics. Please follow-up with your doctor in the next 3 days for reevaluation. Return to ED for any new or worsening symptoms.  Upper Respiratory Infection, Adult Most upper respiratory infections (URIs) are a viral infection of the air passages leading to the lungs. A URI affects the nose, throat, and upper air passages. The most common type of URI is nasopharyngitis and is typically referred to as "the common cold." URIs run their course and usually go away on their own. Most of the time, a URI does not require medical attention, but sometimes a bacterial infection in the upper airways can follow a viral infection. This is called a secondary infection. Sinus and middle ear infections are common types of secondary upper respiratory infections. Bacterial pneumonia can also complicate a URI. A URI can worsen asthma and chronic obstructive pulmonary disease (COPD). Sometimes, these complications can require emergency medical care and may be life threatening.  CAUSES Almost all URIs are caused by viruses. A virus is a type of germ and can spread from one person to another.  RISKS FACTORS You may be at risk for a URI if:   You smoke.   You have chronic heart or lung disease.  You have a weakened defense (immune) system.   You are very young or very old.   You have nasal allergies or asthma.  You work in crowded or poorly ventilated areas.  You work in health care facilities or schools. SIGNS AND SYMPTOMS  Symptoms typically develop 2-3 days after you come in contact with a cold virus. Most viral URIs last 7-10 days. However, viral URIs from the influenza virus (flu virus) can last 14-18 days and are typically more severe. Symptoms may include:   Runny or stuffy (congested) nose.    Sneezing.   Cough.   Sore throat.   Headache.   Fatigue.   Fever.   Loss of appetite.   Pain in your forehead, behind your eyes, and over your cheekbones (sinus pain).  Muscle aches.  DIAGNOSIS  Your health care provider may diagnose a URI by:  Physical exam.  Tests to check that your symptoms are not due to another condition such as:  Strep throat.  Sinusitis.  Pneumonia.  Asthma. TREATMENT  A URI goes away on its own with time. It cannot be cured with medicines, but medicines may be prescribed or recommended to relieve symptoms. Medicines may help:  Reduce your fever.  Reduce your cough.  Relieve nasal congestion. HOME CARE INSTRUCTIONS   Take medicines only as directed by your health care provider.   Gargle warm saltwater or take cough drops to comfort your throat as directed by your health care provider.  Use a warm mist humidifier or inhale steam from a shower to increase air moisture. This may make it easier to breathe.  Drink enough fluid to keep your urine clear or pale yellow.   Eat soups and other clear broths and maintain good nutrition.   Rest as needed.   Return to work when your temperature has returned to normal or as your health care provider advises. You may need to stay home longer to avoid infecting others. You can also use a face mask and careful hand washing to prevent spread of the virus.  Increase the  usage of your inhaler if you have asthma.   Do not use any tobacco products, including cigarettes, chewing tobacco, or electronic cigarettes. If you need help quitting, ask your health care provider. PREVENTION  The best way to protect yourself from getting a cold is to practice good hygiene.   Avoid oral or hand contact with people with cold symptoms.   Wash your hands often if contact occurs.  There is no clear evidence that vitamin C, vitamin E, echinacea, or exercise reduces the chance of developing a cold.  However, it is always recommended to get plenty of rest, exercise, and practice good nutrition.  SEEK MEDICAL CARE IF:   You are getting worse rather than better.   Your symptoms are not controlled by medicine.   You have chills.  You have worsening shortness of breath.  You have brown or red mucus.  You have yellow or brown nasal discharge.  You have pain in your face, especially when you bend forward.  You have a fever.  You have swollen neck glands.  You have pain while swallowing.  You have white areas in the back of your throat. SEEK IMMEDIATE MEDICAL CARE IF:   You have severe or persistent:  Headache.  Ear pain.  Sinus pain.  Chest pain.  You have chronic lung disease and any of the following:  Wheezing.  Prolonged cough.  Coughing up blood.  A change in your usual mucus.  You have a stiff neck.  You have changes in your:  Vision.  Hearing.  Thinking.  Mood. MAKE SURE YOU:   Understand these instructions.  Will watch your condition.  Will get help right away if you are not doing well or get worse.   This information is not intended to replace advice given to you by your health care provider. Make sure you discuss any questions you have with your health care provider.   Document Released: 06/02/2001 Document Revised: 04/23/2015 Document Reviewed: 03/14/2014 Elsevier Interactive Patient Education [employer].

## 2015-12-03 NOTE — ED Provider Notes (Signed)
CSN: [id number]     Arrival date & time 12/03/15  1007 History   First MD Initiated Contact with Patient 12/03/15 1042     Chief Complaint  Patient presents with  . Cough  . Headache     (Consider location/radiation/quality/duration/timing/severity/associated sxs/prior Treatment) HPI Russell [name] is a 74 y.o. male with history of COPD, CAD, hypertension, comes in for evaluation of cough and headache. Patient reports he has had a productive brown sputum cough for the past 3 days with associated headache. He states he feels like he has pneumonia as he felt the same way in 2013 when he was diagnosed with pneumonia. He denies any fevers at home. He is tried cough syrup OTC without relief of his symptoms. Denies any recent hospitalizations or antibiotic use. No leg swelling, nausea or vomiting, abdominal pain, urinary symptoms, chest pain. No other modifying factors.  Past Medical History  Diagnosis Date  . Mitral regurgitation   . Glaucoma   . Depression   . Hypertension   . Hyperlipidemia, mixed   . History of syncope     03-22-2014  DX  VAGAL RESPONSE  . Coronary atherosclerosis   . COPD (chronic obstructive pulmonary disease) ([place])   . GERD (gastroesophageal reflux disease)   . Diverticulosis of colon   . History of gastritis   . Prostate cancer ([place])   . BPH with obstruction/lower urinary tract symptoms   . ED (erectile dysfunction)   . Arthritis     SHOUDLERS, LUMBAR  . 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 History of scabies     2008  . Type 2 diabetes, diet controlled ([place])   . Wears glasses   . Full dentures    Past Surgical History  Procedure Laterality Date  . Knee arthroscopy Right 2008  . Capsulotomy Right 01/26/2013    Procedure: MINOR CAPSULOTOMY;  Surgeon: Myrtha Mantis., MD;  Location: [facility];  Service: Ophthalmology;  Laterality: Right;  . Yag laser application Right [id number]    Procedure: YAG LASER APPLICATION;   Surgeon: Myrtha Mantis., MD;  Location: [facility];  Service: Ophthalmology;  Laterality: Right;  . I&d extremity Right 05/24/2013    Procedure: RIGHT INDEX WOUND DEBRIDEMENT AND CLOSURE;  Surgeon: Jolyn Nap, MD;  Location: [facility];  Service: Orthopedics;  Laterality: Right;  . Foot surgery Left   . Carpectomy Right 03/13/2014    Procedure: RIGHT  PROXIMAL ROW CARPECTOMY;  Surgeon: Tennis Must, MD;  Location: [place];  Service: Orthopedics;  Laterality: Right;  . Carpometacarpal (cmc) fusion of thumb Right 03/13/2014    Procedure: RIGHT FUSION OF THUMB CARPOMETACARPAL [facility]) JOINT;  Surgeon: Tennis Must, MD;  Location: [facility];  Service: Orthopedics;  Laterality: Right;  . Cataract extraction w/ intraocular lens  implant, bilateral  2009  . Yag laser capsulotomy, left eye  01-08-2011  . Anterior cervical decomp/discectomy fusion  10-16-2009    C4 -- C6  . Excisional debridement and repair right quadricep tendon  07-05-2000  . Laryngoscopy and esophagoscopy  06-13-2010    MARSUPIALIZATION LEFT LARGE VALLECULAR CYST  . Excision mass left face , suborbital area, plaster reconstruction  02-09-2011  . Shoulder arthroscopy/ debridement labral tear/  biceps tenotomy Left 09-24-2011  . Colonoscopy  08-31-2007  . Esophagogastroduodenoscopy  last one 09-11-2008  . Transthoracic echocardiogram  02-21-2010    normal LVF/  ef 55-60%/ mild to moderate MR/  moderate LAE/  mild TR  . Tonsillectomy  as child  . Cryoablation N/A 01/14/2015    Procedure: CRYO ABLATION PROSTATE;  Surgeon: Ailene Rud, MD;  Location[facility];  Service: Urology;  Laterality: N/A;  . Insertion of suprapubic catheter N/A 01/14/2015    Procedure: SUPRAPUBIC TUBE PLACEMENT;  Surgeon: Ailene Rud, MD;  Location: [facility];  Service: Urology;  Laterality: N/A;  . Orchiectomy Left 02/24/2015    Procedure: SCROTAL  EXPLORATION WITH LEFT ORCHIECTOMY;  Surgeon: Kathie Rhodes, MD;  Location: [place];  Service: Urology;  Laterality: Left;  . Carpectomy Left 10/22/2015    Procedure: LEFT WRIST PROXIMAL ROW CARPECTOMY ;  Surgeon: Leanora Cover, MD;  Location: [place];  Service: Orthopedics;  Laterality: Left;  Marland Kitchen Mass excision Left 10/22/2015    Procedure: EXCISION MASS OF LEFT INDEX FINGER;  Surgeon: Leanora Cover, MD;  Location: [place];  Service: Orthopedics;  Laterality: Left;   Family History  Problem Relation Age of Onset  . Diabetes    . Cancer     Social History  Substance Use Topics  . Smoking status: Current Every Day Smoker -- 1.00 packs/day for 51 years    Types: Cigarettes  . Smokeless tobacco: Never Used  . Alcohol Use: No    Review of Systems A 10 point review of systems was completed and was negative except for pertinent positives and negatives as mentioned in the history of present illness     Allergies  Review of patient's allergies indicates no known allergies.  Home Medications   Prior to Admission medications   Medication Sig Start Date End Date Taking? Authorizing Provider  albuterol (PROVENTIL HFA;VENTOLIN HFA) 108 (90 BASE) MCG/ACT inhaler Inhale 2 puffs into the lungs every 6 (six) hours as needed for wheezing or shortness of breath. 04/10/13  Yes Nishant Dhungel, MD  atorvastatin (LIPITOR) 80 MG tablet Take 80 mg by mouth daily.   Yes Historical Provider, MD  bimatoprost (LUMIGAN) 0.01 % SOLN Place 1 drop into both eyes at bedtime. 04/10/13  Yes Nishant Dhungel, MD  brimonidine-timolol (COMBIGAN) 0.2-0.5 % ophthalmic solution Place 1 drop into both eyes 2 (two) times daily.    Yes Historical Provider, MD  dorzolamide (TRUSOPT) 2 % ophthalmic solution Place 1 drop into the left eye 3 (three) times daily.  11/09/13  Yes Historical Provider, MD  esomeprazole (NEXIUM) 40 MG capsule Take 40 mg by mouth every morning.    Yes Historical Provider, MD   ferrous sulfate (SM IRON) 325 (65 FE) MG tablet Take 325 mg by mouth daily.   Yes Historical Provider, MD  HYDROcodone-acetaminophen (NORCO) 7.5-325 MG tablet Take 1 tablet by mouth every 6 (six) hours as needed. pain 12/02/15  Yes Historical Provider, MD  losartan (COZAAR) 100 MG tablet Take 1 tablet (100 mg total) by mouth daily. Patient taking differently: Take 100 mg by mouth every morning.  04/10/13  Yes Nishant Dhungel, MD  Multiple Vitamins-Minerals (CENTRUM SILVER ADULT 50+ PO) Take 1 tablet by mouth daily.   Yes Historical Provider, MD  Omega-3 Fatty Acids (FISH OIL) 1000 MG CAPS Take 1 capsule by mouth every morning.    Yes Historical Provider, MD  oxybutynin (DITROPAN-XL) 10 MG 24 hr tablet Take 10 mg by mouth at bedtime.   Yes Historical Provider, MD  tamsulosin (FLOMAX) 0.4 MG CAPS capsule Take 0.4 mg by mouth every morning.  11/19/13  Yes Historical Provider, MD  azithromycin (ZITHROMAX) 250 MG tablet Take 1 tablet (250 mg total) by mouth daily. Take first 2 tablets together, then 1 every day until finished. 12/03/15   Comer Locket, PA-C  oxyCODONE-acetaminophen (PERCOCET) 5-325 MG tablet 1-2 tabs po q6 hours prn pain Patient not taking: Reported on 12/03/2015 10/22/15   Leanora Cover, MD   BP 150/85 mmHg  Pulse 72  Temp(Src) 99.9 F (37.7 C) (Oral)  Resp 15  SpO2 99% Physical Exam  Constitutional: He is oriented to person, place, and time. He appears well-developed and well-nourished.  African-American male  HENT:  Head: Normocephalic and atraumatic.  Mouth/Throat: Oropharynx is clear and moist.  Eyes: Conjunctivae are normal. Pupils are equal, round, and reactive to light. Right eye exhibits no discharge. Left eye exhibits no discharge. No scleral icterus.  Neck: Neck supple.  Cardiovascular: Normal rate, regular rhythm and normal heart sounds.   Pulmonary/Chest: Effort normal and breath sounds normal. No respiratory distress. He has no wheezes.  Rales in left lower  lobe. No other adventitious lung sounds. No evidence of respiratory distress.  Abdominal: Soft. There is no tenderness.  Musculoskeletal: He exhibits no tenderness.  Neurological: He is alert and oriented to person, place, and time.  Cranial Nerves II-XII grossly intact  Skin: Skin is warm and dry. No rash noted.  Psychiatric: He has a normal mood and affect.  Nursing note and vitals reviewed.   ED Course  Procedures (including critical care time) Labs Review Labs Reviewed - No data to display  Imaging Review Dg Chest 2 View  12/03/2015  CLINICAL DATA:  Shortness of breath and chest pain ; fever EXAM: CHEST  2 VIEW COMPARISON:  February 22, 2015 FINDINGS: There is no edema or consolidation. Heart is upper normal in size with pulmonary vascularity within normal limits. There is atherosclerotic calcification in the aorta. There is postoperative change in the lower cervical spine. No adenopathy. IMPRESSION: No edema or consolidation. Electronically Signed   By: Lowella Grip III M.D.   On: 12/03/2015 12:09   I have personally reviewed and evaluated these images and lab results as part of my medical decision-making.   EKG Interpretation None     Meds given in ED:  Medications  azithromycin (ZITHROMAX) tablet 500 mg (500 mg Oral Given 12/03/15 1316)    Discharge Medication List as of 12/03/2015  1:07 PM    START taking these medications   Details  azithromycin (ZITHROMAX) 250 MG tablet Take 1 tablet (250 mg total) by mouth daily. Take first 2 tablets together, then 1 every day until finished., Starting 12/03/2015, Until Discontinued, Print       Filed Vitals:   12/03/15 1145 12/03/15 1245 12/03/15 1300 12/03/15 1315  BP: 144/82 128/75 134/73 150/85  Pulse: 73 68 63 72  Temp:      TempSrc:      Resp:  15 19 15   SpO2: 95% 99% 97% 99%    MDM  Russell [name] is a 74 y.o. male history of COPD who comes in for evaluation of productive cough and headache. Symptoms ongoing  for 3 days. On arrival, patient is hemodynamically stable and afebrile. His oxygen saturations are 98% on room air, no tachypnea or other evidence of respiratory distress. He does have mild rales in his lower left lobe. Possibly due to underlying COPD. Chest x-ray is negative for any acute processes. However, patient may have early/developing pneumonia. Given COPD history, Will treat with azithromycin, given 500 mg  in the ED and discharge with prescription. Encouraged follow-up with his PCP in the next 3 days for reevaluation as needed. Given strict return precautions. Overall, patient appears well, nontoxic, hemodynamically stable and is appropriate for outpatient follow-up. Prior to discharge, I discussed and reviewed this case with my attending, Dr. Ralene Bathe who also saw and evaluated the patient and agrees with above plan. The patient appears reasonably screened and/or stabilized for discharge and I doubt any other medical condition or other [facility] requiring further screening, evaluation, or treatment in the ED at this time prior to discharge.   Final diagnoses:  Bronchitis        Comer Locket, PA-C 12/03/15 [place], MD 12/04/15 0700

## 2015-12-03 NOTE — ED Notes (Signed)
Pt. HR down to 30 on the monitor. Checked on pt. And his HR was back into the 60's. IV started at this time.

## 2015-12-03 NOTE — ED Notes (Signed)
Pt reports cough and headache for several days. Has hard coughing spells that makes him gag.

## 2015-12-05 ENCOUNTER — Ambulatory Visit (INDEPENDENT_AMBULATORY_CARE_PROVIDER_SITE_OTHER): Payer: Medicare Other | Admitting: Podiatry

## 2015-12-05 ENCOUNTER — Encounter: Payer: Self-pay | Admitting: Podiatry

## 2015-12-05 VITALS — BP 154/70 | HR 57 | Resp 16

## 2015-12-05 DIAGNOSIS — G5792 Unspecified mononeuropathy of left lower limb: Secondary | ICD-10-CM

## 2015-12-08 NOTE — Progress Notes (Signed)
He presents today complaining of pain to the fourth digit of the left foot. We have performed multiple procedures including neurectomy as well as hammertoe repair with internal fixation. He states that the fourth toe has pain in it that radiates from the dorsal aspect of the left foot. He demonstrates an area overlying the distal aspect of the fourth metatarsal.  Objective: Pulses are palpable left foot. He has pain on palpation to the third interdigital space with the neuroma was resected. He also has tenderness on palpation of the toe itself.  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 Neuritis possible painful fixation.  Plan: The next time he comes in we will take a set of x-rays and consider surgical intervention. At this point I injected dehydrated alcohol to the third interdigital space and into the third digit itself. Should this alleviate some of his symptoms we will do this once again.

## 2016-01-01 ENCOUNTER — Encounter: Payer: Self-pay | Admitting: Gastroenterology

## 2016-01-02 ENCOUNTER — Ambulatory Visit (INDEPENDENT_AMBULATORY_CARE_PROVIDER_SITE_OTHER): Payer: Medicare Other | Admitting: Podiatry

## 2016-01-02 ENCOUNTER — Telehealth: Payer: Self-pay | Admitting: *Deleted

## 2016-01-02 ENCOUNTER — Ambulatory Visit (INDEPENDENT_AMBULATORY_CARE_PROVIDER_SITE_OTHER): Payer: Medicare Other

## 2016-01-02 DIAGNOSIS — M779 Enthesopathy, unspecified: Principal | ICD-10-CM

## 2016-01-02 DIAGNOSIS — G5762 Lesion of plantar nerve, left lower limb: Secondary | ICD-10-CM | POA: Diagnosis not present

## 2016-01-02 DIAGNOSIS — M778 Other enthesopathies, not elsewhere classified: Secondary | ICD-10-CM

## 2016-01-02 DIAGNOSIS — M7752 Other enthesopathy of left foot: Secondary | ICD-10-CM

## 2016-01-02 DIAGNOSIS — I739 Peripheral vascular disease, unspecified: Secondary | ICD-10-CM

## 2016-01-02 NOTE — Progress Notes (Signed)
He presents today complaining of painful internal fixation to the third and fourth toes of the left foot. He states that these toes are just killing me. He would like to have the screws taken out. I explained to him that we had already taken the screws at past he denies this. He still has pain across the dorsal aspect of the left foot and states that the toes feel cold now all of the time. He states that his left shoulder is horrible in his left hand is even worse.  Objective: Vital signs are stable alert and oriented 3. Pulses are no longer palpable left foot. Digital hair is no longer present his skin is becoming tight and shiny. He has pain on palpation to the third and fourth metatarsophalangeal joints and to the third interdigital space left.  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  Plan: Discussed with him in great detail today the fact that we need to request vascular studies the circulation in his bilateral lower extremity he is amenable to this. I also discussed the need for an MRI of the forefoot left. Radiographs taken today do not demonstrate any painful internal fixation. There is no internal fixation at all.

## 2016-01-02 NOTE — Telephone Encounter (Addendum)
Dr. Milinda Pointer ordered ABI and TBI with B/L Arterial Seg. Multiple, surgical consideration for left foot neurectomy, diminished pedal pulses.  Faxed orders and pt demographics to [facility].  01/22/2016 - Dr. Milinda Pointer states pt's blood flow is okay for surgery, and he will be followed up by Dr. Gwenlyn Found later this month, then we will see him after that. Informed pt of results.

## 2016-01-08 ENCOUNTER — Other Ambulatory Visit: Payer: Self-pay | Admitting: Podiatry

## 2016-01-08 DIAGNOSIS — M778 Other enthesopathies, not elsewhere classified: Secondary | ICD-10-CM

## 2016-01-08 DIAGNOSIS — I739 Peripheral vascular disease, unspecified: Secondary | ICD-10-CM

## 2016-01-08 DIAGNOSIS — M779 Enthesopathy, unspecified: Principal | ICD-10-CM

## 2016-01-08 DIAGNOSIS — G5762 Lesion of plantar nerve, left lower limb: Secondary | ICD-10-CM

## 2016-01-14 ENCOUNTER — Inpatient Hospital Stay (HOSPITAL_COMMUNITY): Admission: RE | Admit: 2016-01-14 | Payer: Medicare Other | Source: Ambulatory Visit

## 2016-01-15 DIAGNOSIS — N138 Other obstructive and reflux uropathy: Secondary | ICD-10-CM

## 2016-01-15 DIAGNOSIS — N401 Enlarged prostate with lower urinary tract symptoms: Secondary | ICD-10-CM

## 2016-01-15 HISTORY — DX: Benign prostatic hyperplasia with lower urinary tract symptoms: N13.8

## 2016-01-15 HISTORY — DX: Benign prostatic hyperplasia with lower urinary tract symptoms: N40.1

## 2016-01-16 ENCOUNTER — Other Ambulatory Visit: Payer: Self-pay | Admitting: Podiatry

## 2016-01-16 DIAGNOSIS — I739 Peripheral vascular disease, unspecified: Secondary | ICD-10-CM

## 2016-01-16 DIAGNOSIS — M778 Other enthesopathies, not elsewhere classified: Secondary | ICD-10-CM

## 2016-01-16 DIAGNOSIS — R0989 Other specified symptoms and signs involving the circulatory and respiratory systems: Secondary | ICD-10-CM

## 2016-01-16 DIAGNOSIS — M779 Enthesopathy, unspecified: Principal | ICD-10-CM

## 2016-01-16 DIAGNOSIS — G5762 Lesion of plantar nerve, left lower limb: Secondary | ICD-10-CM

## 2016-01-20 ENCOUNTER — Other Ambulatory Visit: Payer: Self-pay | Admitting: Podiatry

## 2016-01-20 ENCOUNTER — Ambulatory Visit (HOSPITAL_COMMUNITY)
Admission: RE | Admit: 2016-01-20 | Discharge: 2016-01-20 | Disposition: A | Discharge status: Home / Self Care | Payer: Medicare Other | Source: Ambulatory Visit | Attending: Cardiology | Admitting: Cardiology

## 2016-01-20 DIAGNOSIS — I739 Peripheral vascular disease, unspecified: Secondary | ICD-10-CM | POA: Diagnosis not present

## 2016-01-20 DIAGNOSIS — G5762 Lesion of plantar nerve, left lower limb: Secondary | ICD-10-CM | POA: Insufficient documentation

## 2016-01-20 DIAGNOSIS — I748 Embolism and thrombosis of other arteries: Secondary | ICD-10-CM | POA: Insufficient documentation

## 2016-01-20 DIAGNOSIS — M7752 Other enthesopathy of left foot: Secondary | ICD-10-CM | POA: Insufficient documentation

## 2016-01-20 DIAGNOSIS — M779 Enthesopathy, unspecified: Secondary | ICD-10-CM

## 2016-01-20 DIAGNOSIS — R200 Anesthesia of skin: Secondary | ICD-10-CM | POA: Diagnosis not present

## 2016-01-20 DIAGNOSIS — E119 Type 2 diabetes mellitus without complications: Secondary | ICD-10-CM | POA: Insufficient documentation

## 2016-01-20 DIAGNOSIS — E785 Hyperlipidemia, unspecified: Secondary | ICD-10-CM | POA: Diagnosis not present

## 2016-01-20 DIAGNOSIS — I708 Atherosclerosis of other arteries: Secondary | ICD-10-CM | POA: Insufficient documentation

## 2016-01-20 DIAGNOSIS — I10 Essential (primary) hypertension: Secondary | ICD-10-CM | POA: Diagnosis not present

## 2016-01-20 DIAGNOSIS — R938 Abnormal findings on diagnostic imaging of other specified body structures: Secondary | ICD-10-CM | POA: Insufficient documentation

## 2016-01-20 DIAGNOSIS — R0989 Other specified symptoms and signs involving the circulatory and respiratory systems: Secondary | ICD-10-CM | POA: Insufficient documentation

## 2016-01-20 DIAGNOSIS — M778 Other enthesopathies, not elsewhere classified: Secondary | ICD-10-CM

## 2016-01-22 NOTE — Telephone Encounter (Signed)
-----   Message from Garrel Ridgel, [place] sent at 01/22/2016 12:14 PM EST ----- He will be seeing dr berry later this month. We will talk to him after that ----- Message -----    From: Arnoldo Lenis, MD    Sent: 01/22/2016  10:09 AM      To: Garrel Ridgel, DPM

## 2016-01-22 NOTE — Telephone Encounter (Signed)
-----   Message from Garrel Ridgel, [place] sent at 01/22/2016 12:13 PM EST ----- Blood flow ok for surgery. ----- Message -----    From: Arnoldo Lenis, MD    Sent: 01/22/2016  10:07 AM      To: Garrel Ridgel, DPM

## 2016-01-23 ENCOUNTER — Encounter: Payer: Self-pay | Admitting: Cardiovascular Disease

## 2016-01-23 ENCOUNTER — Ambulatory Visit (INDEPENDENT_AMBULATORY_CARE_PROVIDER_SITE_OTHER): Payer: Medicare Other | Admitting: Cardiovascular Disease

## 2016-01-23 VITALS — BP 140/68 | HR 46 | Ht 74.0 in | Wt 169.1 lb

## 2016-01-23 DIAGNOSIS — E7800 Pure hypercholesterolemia, unspecified: Secondary | ICD-10-CM

## 2016-01-23 DIAGNOSIS — I10 Essential (primary) hypertension: Secondary | ICD-10-CM

## 2016-01-23 DIAGNOSIS — I739 Peripheral vascular disease, unspecified: Secondary | ICD-10-CM

## 2016-01-23 DIAGNOSIS — F17200 Nicotine dependence, unspecified, uncomplicated: Secondary | ICD-10-CM

## 2016-01-23 NOTE — Patient Instructions (Signed)
NO CHANGE WITH CURRENT MEDICATIONS  Your physician recommends that you schedule a follow-up appointment AS NEEDED BASIS.

## 2016-01-23 NOTE — Assessment & Plan Note (Signed)
History of hypertension blood pressure measured today 140/68. He is on losartan. Continue current meds current dosing

## 2016-01-23 NOTE — Progress Notes (Signed)
01/23/2016 Russell [name]   Jul 25, 1941  [id number]  Primary Physician Russell [name], Russell [name], Russell [name]: Russell [name] Russell [name]  HPI:  Russell [name] is a 75 year old thin appearing married African-American male father of 2, grandfather and one child who is retired from [employer]. He is referred by Dr. Milinda [name], his podiatrist, for peripheral vascular evaluation. His primary care physician physician is Dr. Kevan [name]. This was factors include 60-pack-years of tobacco abuse currently smoking one pack per day. He has treated hypertension and hyperlipidemia. He never had a heart attack. He does complain of shortness of breath most likely related to COPD. He denies claudication. He was referred because of absent pulses and a painful left fourth toe. Dopplers performed 01/20/16 revealed normal ABIs bilaterally with a normal right TBI and a left ABI 0.51. He had an occluded posterior tibial and dorsalis pedis bilaterally. There is no evidence of critical limb ischemia.   Current Outpatient Prescriptions  Medication Sig Dispense Refill  . albuterol (PROVENTIL HFA;VENTOLIN HFA) 108 (90 BASE) MCG/ACT inhaler Inhale 2 puffs into the lungs every 6 (six) hours as needed for wheezing or shortness of breath.    Marland Kitchen atorvastatin (LIPITOR) 80 MG tablet Take 80 mg by mouth daily.    Marland Kitchen azithromycin (ZITHROMAX) 250 MG tablet Take 1 tablet (250 mg total) by mouth daily. Take first 2 tablets together, then 1 every day until finished. 6 tablet 0  . bimatoprost (LUMIGAN) 0.01 % SOLN Place 1 drop into both eyes at bedtime. 30 mL 3  . brimonidine-timolol (COMBIGAN) 0.2-0.5 % ophthalmic solution Place 1 drop into both eyes 2 (two) times daily.     . dorzolamide (TRUSOPT) 2 % ophthalmic solution Place 1 drop into the left eye 3 (three) times daily.     Marland Kitchen esomeprazole (NEXIUM) 40 MG capsule Take 40 mg by mouth every morning.     . ferrous sulfate (SM IRON) 325 (65 FE) MG tablet Take 325 mg by  mouth daily.    Marland Kitchen HYDROcodone-acetaminophen (NORCO) 7.5-325 MG tablet Take 1 tablet by mouth every 6 (six) hours as needed. pain    . losartan (COZAAR) 100 MG tablet Take 1 tablet (100 mg total) by mouth daily. (Patient taking differently: Take 100 mg by mouth every morning. ) 30 tablet 3  . Multiple Vitamins-Minerals (CENTRUM SILVER ADULT 50+ PO) Take 1 tablet by mouth daily.    . Omega-3 Fatty Acids (FISH OIL) 1000 MG CAPS Take 1 capsule by mouth every morning.     Marland Kitchen oxybutynin (DITROPAN-XL) 10 MG 24 hr tablet Take 10 mg by mouth at bedtime.    Marland Kitchen oxyCODONE-acetaminophen (PERCOCET) 5-325 MG tablet 1-2 tabs po q6 hours prn pain 40 tablet 0  . tamsulosin (FLOMAX) 0.4 MG CAPS capsule Take 0.4 mg by mouth every morning.      No current facility-administered medications for this visit.    No Known Allergies  Social History   Social History  . Marital Status: Married    Spouse Name: N/A  . Number of Children: N/A  . Years of Education: N/A   Occupational History  . Retired    Social History Main Topics  . Smoking status: Current Every Day Smoker -- 1.00 packs/day for 51 years    Types: Cigarettes  . Smokeless tobacco: Never Used  . Alcohol Use: No  . Drug Use: No  . Sexual Activity: Not on file   Other Topics Concern  . Not on file  Social History Narrative   Lives with wife.     Review of Systems: General: negative for chills, fever, night sweats or weight changes.  Cardiovascular: negative for chest pain, dyspnea on exertion, edema, orthopnea, palpitations, paroxysmal nocturnal dyspnea or shortness of breath Dermatological: negative for rash Respiratory: negative for cough or wheezing Urologic: negative for hematuria Abdominal: negative for nausea, vomiting, diarrhea, bright red blood per rectum, melena, or hematemesis Neurologic: negative for visual changes, syncope, or dizziness All other systems reviewed and are otherwise negative except as noted above.    Blood  pressure 140/68, pulse 46, height 6\' 2"  (1.88 m), weight 169 lb 1.6 oz (76.703 kg).  General appearance: alert and no distress Neck: no adenopathy, no carotid bruit, no JVD, supple, symmetrical, trachea midline and thyroid not enlarged, symmetric, no tenderness/mass/nodules Lungs: clear to auscultation bilaterally Heart: regular rate and rhythm, S1, S2 normal, no murmur, click, rub or gallop Extremities: extremities normal, atraumatic, no cyanosis or edema  EKG not performed today  ASSESSMENT AND PLAN:   HYPERCHOLESTEROLEMIA, MIXED History of hyperlipidemia on statin therapy followed by his PCP  TOBACCO ABUSE History of greater than 50-pack-years of tobacco abuse continuing to smoke one pack per day recalcitrant to risk factor modification  Essential hypertension History of hypertension blood pressure measured today 140/68. He is on losartan. Continue current meds current dosing  Peripheral arterial disease Russell [name]) Russell [name] was referred to me by Dr. Milinda [name], his podiatrist, for evaluation of peripheral arterial disease. His risk factors include greater than 50 pack years of tobacco abuse, treated hypertension and hyperlipidemia. He denies claudication. He does have pain in his left fourth toe mostly at night. Recent Dopplers show mostly tibial vessel disease with occluded posterior tibial and dorsalis pedis bilaterally. His ABIs are normal, his right TBI's 0.94 and left is 0.52. I do not think that his left foot should be instrumented given the potential for lack of healing. I do not think he is a candidate for endovascular therapy given the fact that he does not have an open wound nor does he have critical limb ischemia. I will see him back as needed.      Russell [name] Russell [name],Russell [name],Russell [name], Russell [name] 01/23/2016 2:44 PM

## 2016-01-23 NOTE — Assessment & Plan Note (Signed)
History of greater than 50-pack-years of tobacco abuse continuing to smoke one pack per day recalcitrant to risk factor modification

## 2016-01-23 NOTE — Assessment & Plan Note (Signed)
Russell [name] was referred to me by Dr. Milinda Pointer, his podiatrist, for evaluation of peripheral arterial disease. His risk factors include greater than 50 pack years of tobacco abuse, treated hypertension and hyperlipidemia. He denies claudication. He does have pain in his left fourth toe mostly at night. Recent Dopplers show mostly tibial vessel disease with occluded posterior tibial and dorsalis pedis bilaterally. His ABIs are normal, his right TBI's 0.94 and left is 0.52. I do not think that his left foot should be instrumented given the potential for lack of healing. I do not think he is a candidate for endovascular therapy given the fact that he does not have an open wound nor does he have critical limb ischemia. I will see him back as needed.

## 2016-01-23 NOTE — Assessment & Plan Note (Signed)
History of hyperlipidemia on statin therapy followed by his PCP 

## 2016-02-11 ENCOUNTER — Encounter: Payer: Medicare Other | Admitting: Cardiovascular Disease

## 2016-03-24 ENCOUNTER — Emergency Department (HOSPITAL_COMMUNITY)
Admission: EM | Admit: 2016-03-24 | Discharge: 2016-03-24 | Disposition: A | Discharge status: Home / Self Care | Payer: Medicare Other | Attending: Emergency Medicine | Admitting: Emergency Medicine

## 2016-03-24 ENCOUNTER — Encounter (HOSPITAL_COMMUNITY): Payer: Self-pay | Admitting: Emergency Medicine

## 2016-03-24 ENCOUNTER — Emergency Department (HOSPITAL_COMMUNITY): Payer: Medicare Other

## 2016-03-24 DIAGNOSIS — Z8546 Personal history of malignant neoplasm of prostate: Secondary | ICD-10-CM | POA: Diagnosis not present

## 2016-03-24 DIAGNOSIS — I10 Essential (primary) hypertension: Secondary | ICD-10-CM | POA: Insufficient documentation

## 2016-03-24 DIAGNOSIS — Z79899 Other long term (current) drug therapy: Secondary | ICD-10-CM | POA: Diagnosis not present

## 2016-03-24 DIAGNOSIS — E119 Type 2 diabetes mellitus without complications: Secondary | ICD-10-CM | POA: Insufficient documentation

## 2016-03-24 DIAGNOSIS — R61 Generalized hyperhidrosis: Secondary | ICD-10-CM | POA: Insufficient documentation

## 2016-03-24 DIAGNOSIS — F17210 Nicotine dependence, cigarettes, uncomplicated: Secondary | ICD-10-CM | POA: Insufficient documentation

## 2016-03-24 DIAGNOSIS — J449 Chronic obstructive pulmonary disease, unspecified: Secondary | ICD-10-CM | POA: Diagnosis not present

## 2016-03-24 DIAGNOSIS — R55 Syncope and collapse: Secondary | ICD-10-CM | POA: Insufficient documentation

## 2016-03-24 DIAGNOSIS — I2510 Atherosclerotic heart disease of native coronary artery without angina pectoris: Secondary | ICD-10-CM | POA: Diagnosis not present

## 2016-03-24 DIAGNOSIS — F329 Major depressive disorder, single episode, unspecified: Secondary | ICD-10-CM | POA: Insufficient documentation

## 2016-03-24 DIAGNOSIS — K219 Gastro-esophageal reflux disease without esophagitis: Secondary | ICD-10-CM | POA: Diagnosis not present

## 2016-03-24 DIAGNOSIS — R531 Weakness: Secondary | ICD-10-CM | POA: Insufficient documentation

## 2016-03-24 LAB — CBG MONITORING, ED: GLUCOSE-CAPILLARY: 121 mg/dL — AB (ref 65–99)

## 2016-03-24 LAB — BASIC METABOLIC PANEL
Anion gap: 10 (ref 5–15)
BUN: 16 mg/dL (ref 6–20)
CALCIUM: 9.5 mg/dL (ref 8.9–10.3)
CO2: 27 mmol/L (ref 22–32)
Chloride: 104 mmol/L (ref 101–111)
Creatinine, Ser: 1.16 mg/dL (ref 0.61–1.24)
GFR, EST NON AFRICAN AMERICAN: 60 mL/min — AB (ref >60)
Glucose, Bld: 125 mg/dL — ABNORMAL HIGH (ref 65–99)
Potassium: 4.1 mmol/L (ref 3.5–5.1)
Sodium: 141 mmol/L (ref 135–145)

## 2016-03-24 LAB — CBC WITH DIFFERENTIAL/PLATELET
BASOS PCT: 1 %
Basophils Absolute: 0.1 10*3/uL (ref 0.0–0.1)
EOS ABS: 0.1 10*3/uL (ref 0.0–0.7)
EOS PCT: 0 %
HCT: 38.6 % — ABNORMAL LOW (ref 39.0–52.0)
Hemoglobin: 11.9 g/dL — ABNORMAL LOW (ref 13.0–17.0)
Lymphocytes Relative: 14 %
Lymphs Abs: 1.6 10*3/uL (ref 0.7–4.0)
MCH: 24.2 pg — ABNORMAL LOW (ref 26.0–34.0)
MCHC: 30.8 g/dL (ref 30.0–36.0)
MCV: 78.5 fL (ref 78.0–100.0)
MONO ABS: 0.4 10*3/uL (ref 0.1–1.0)
Monocytes Relative: 4 %
Neutro Abs: 9.4 10*3/uL — ABNORMAL HIGH (ref 1.7–7.7)
Neutrophils Relative %: 81 %
PLATELETS: 286 10*3/uL (ref 150–400)
RBC: 4.92 MIL/uL (ref 4.22–5.81)
RDW: 18.6 % — AB (ref 11.5–15.5)
WBC: 11.5 10*3/uL — ABNORMAL HIGH (ref 4.0–10.5)

## 2016-03-24 LAB — I-STAT TROPONIN, ED: Troponin i, poc: 0 ng/mL (ref 0.00–0.08)

## 2016-03-24 NOTE — ED Provider Notes (Signed)
CSN: [id number]     Arrival date & time 03/24/16  1208 History   First MD Initiated Contact with Patient 03/24/16 1239     Chief Complaint  Patient presents with  . Loss of Consciousness     (Consider location/radiation/quality/duration/timing/severity/associated sxs/prior Treatment) HPI Comments: Patient is a 75 year old male with a history of mitral regurgitation, CAD, COPD, syncope diagnosed in 2015 is available or spots presenting today with a syncopal event. Patient states that he had been feeling his normal self yesterday but when he got out of bed today he felt lightheaded. He states he tried to push through it and when outside started doing his normal stuff but the dizziness became worse. He went into the kitchen and sat down with a glass of water but then he broke out in a cold sweat his vision became near and he lost consciousness. He states he did not file the chair which his latest head down on the table.  Per EMS wife reported he was out for 5 minutes. Patient denies any chest pain, palpitations, shortness of breath, fever. He denies new cough and does admit to still using tobacco. He denies any alcohol or drug use.  Patient is a 75 y.o. male presenting with syncope. The history is provided by the patient.  Loss of Consciousness Episode history:  Single Most recent episode:  Today Timing:  Constant Progression:  Resolved Chronicity:  Recurrent Context: standing up   Witnessed: yes   Relieved by:  Lying down and drinking Worsened by:  Posture Ineffective treatments:  None tried Associated symptoms: diaphoresis and weakness   Associated symptoms: no chest pain, no difficulty breathing, no focal weakness, no headaches, no nausea, no palpitations and no shortness of breath   Risk factors: coronary artery disease     Past Medical History  Diagnosis Date  . Mitral regurgitation   . Glaucoma   . Depression   . Hypertension   . Hyperlipidemia, mixed   . History of syncope      03-22-2014  DX  VAGAL RESPONSE  . Coronary atherosclerosis   . COPD (chronic obstructive pulmonary disease) ([place])   . GERD (gastroesophageal reflux disease)   . Diverticulosis of colon   . History of gastritis   . Prostate cancer ([place])   . BPH with obstruction/lower urinary tract symptoms   . ED (erectile dysfunction)   . Arthritis     SHOUDLERS, LUMBAR  . 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 History of scabies     2008  . Type 2 diabetes, diet controlled ([place])   . Wears glasses   . Full dentures   . Peripheral arterial disease ([place])     one vessel runoff bilaterally via peroneal arteries   Past Surgical History  Procedure Laterality Date  . Knee arthroscopy Right 2008  . Capsulotomy Right 01/26/2013    Procedure: MINOR CAPSULOTOMY;  Surgeon: Myrtha Mantis., MD;  Location: [place];  Service: Ophthalmology;  Laterality: Right;  . Yag laser application Right [id number]    Procedure: YAG LASER APPLICATION;  Surgeon: Myrtha Mantis., MD;  Location: [place];  Service: Ophthalmology;  Laterality: Right;  . I&d extremity Right 05/24/2013    Procedure: RIGHT INDEX WOUND DEBRIDEMENT AND CLOSURE;  Surgeon: Jolyn Nap, MD;  Location: [place];  Service: Orthopedics;  Laterality: Right;  . Foot surgery Left   . Carpectomy Right 03/13/2014  Procedure: RIGHT  PROXIMAL ROW CARPECTOMY;  Surgeon: Tennis Must, MD;  Location: [place];  Service: Orthopedics;  Laterality: Right;  . Carpometacarpal (cmc) fusion of thumb Right 03/13/2014    Procedure: RIGHT FUSION OF THUMB CARPOMETACARPAL [facility]) JOINT;  Surgeon: Tennis Must, MD;  Location: [place];  Service: Orthopedics;  Laterality: Right;  . Cataract extraction w/ intraocular lens  implant, bilateral  2009  . Yag laser capsulotomy, left eye  01-08-2011  . Anterior cervical decomp/discectomy fusion  10-16-2009    C4 -- C6  . Excisional debridement and  repair right quadricep tendon  07-05-2000  . Laryngoscopy and esophagoscopy  06-13-2010    MARSUPIALIZATION LEFT LARGE VALLECULAR CYST  . Excision mass left face , suborbital area, plaster reconstruction  02-09-2011  . Shoulder arthroscopy/ debridement labral tear/  biceps tenotomy Left 09-24-2011  . Colonoscopy  08-31-2007  . Esophagogastroduodenoscopy  last one 09-11-2008  . Transthoracic echocardiogram  02-21-2010    normal LVF/  ef 55-60%/ mild to moderate MR/  moderate LAE/  mild TR  . Tonsillectomy  as child  . Cryoablation N/A 01/14/2015    Procedure: CRYO ABLATION PROSTATE;  Surgeon: Ailene Rud, MD;  Location: [facility];  Service: Urology;  Laterality: N/A;  . Insertion of suprapubic catheter N/A 01/14/2015    Procedure: SUPRAPUBIC TUBE PLACEMENT;  Surgeon: Ailene Rud, MD;  Location: [facility];  Service: Urology;  Laterality: N/A;  . Orchiectomy Left 02/24/2015    Procedure: SCROTAL EXPLORATION WITH LEFT ORCHIECTOMY;  Surgeon: Kathie Rhodes, MD;  Location: [place];  Service: Urology;  Laterality: Left;  . Carpectomy Left 10/22/2015    Procedure: LEFT WRIST PROXIMAL ROW CARPECTOMY ;  Surgeon: Leanora Cover, MD;  Location: [place];  Service: Orthopedics;  Laterality: Left;  Marland Kitchen Mass excision Left 10/22/2015    Procedure: EXCISION MASS OF LEFT INDEX FINGER;  Surgeon: Leanora Cover, MD;  Location: [place];  Service: Orthopedics;  Laterality: Left;   Family History  Problem Relation Age of Onset  . Diabetes    . Cancer     Social History  Substance Use Topics  . Smoking status: Current Every Day Smoker -- 1.00 packs/day for 51 years    Types: Cigarettes  . Smokeless tobacco: Never Used  . Alcohol Use: No    Review of Systems  Constitutional: Positive for diaphoresis.  Respiratory: Negative for shortness of breath.   Cardiovascular: Positive for syncope. Negative for chest pain and palpitations.   Gastrointestinal: Negative for nausea.  Neurological: Positive for weakness. Negative for focal weakness and headaches.  All other systems reviewed and are negative.     Allergies  Review of patient's allergies indicates no known allergies.  Home Medications   Prior to Admission medications   Medication Sig Start Date End Date Taking? Authorizing Provider  albuterol (PROVENTIL HFA;VENTOLIN HFA) 108 (90 BASE) MCG/ACT inhaler Inhale 2 puffs into the lungs every 6 (six) hours as needed for wheezing or shortness of breath. 04/10/13   Nishant Dhungel, MD  atorvastatin (LIPITOR) 80 MG tablet Take 80 mg by mouth daily.    Historical Provider, MD  azithromycin (ZITHROMAX) 250 MG tablet Take 1 tablet (250 mg total) by mouth daily. Take first 2 tablets together, then 1 every day until finished. 12/03/15   Comer Locket, PA-C  bimatoprost (LUMIGAN) 0.01 % SOLN Place 1 drop into both eyes at bedtime. 04/10/13   Louellen Molder, MD  brimonidine-timolol (COMBIGAN) 0.2-0.5 % ophthalmic solution Place 1 drop into both eyes 2 (two) times daily.     Historical Provider, MD  dorzolamide (TRUSOPT) 2 % ophthalmic solution Place 1 drop into the left eye 3 (three) times daily.  11/09/13   Historical Provider, MD  esomeprazole (NEXIUM) 40 MG capsule Take 40 mg by mouth every morning.     Historical Provider, MD  ferrous sulfate (SM IRON) 325 (65 FE) MG tablet Take 325 mg by mouth daily.    Historical Provider, MD  HYDROcodone-acetaminophen (NORCO) 7.5-325 MG tablet Take 1 tablet by mouth every 6 (six) hours as needed. pain 12/02/15   Historical Provider, MD  losartan (COZAAR) 100 MG tablet Take 1 tablet (100 mg total) by mouth daily. Patient taking differently: Take 100 mg by mouth every morning.  04/10/13   Nishant Dhungel, MD  Multiple Vitamins-Minerals (CENTRUM SILVER ADULT 50+ PO) Take 1 tablet by mouth daily.    Historical Provider, MD  Omega-3 Fatty Acids (FISH OIL) 1000 MG CAPS Take 1 capsule by mouth  every morning.     Historical Provider, MD  oxybutynin (DITROPAN-XL) 10 MG 24 hr tablet Take 10 mg by mouth at bedtime.    Historical Provider, MD  oxyCODONE-acetaminophen (PERCOCET) 5-325 MG tablet 1-2 tabs po q6 hours prn pain 10/22/15   Leanora Cover, MD  tamsulosin (FLOMAX) 0.4 MG CAPS capsule Take 0.4 mg by mouth every morning.  11/19/13   Historical Provider, MD   BP 131/63 mmHg  Pulse 48  Temp(Src) 98.2 F (36.8 C) (Oral)  Resp 14  SpO2 96% Physical Exam  Constitutional: He is oriented to person, place, and time. He appears well-developed and well-nourished. No distress.  HENT:  Head: Normocephalic and atraumatic.  Mouth/Throat: Oropharynx is clear and moist.  Eyes: Conjunctivae and EOM are normal. Pupils are equal, round, and reactive to light.  Neck: Normal range of motion. Neck supple.  Cardiovascular: Normal rate, regular rhythm and intact distal pulses.   No murmur heard. Pulmonary/Chest: Effort normal and breath sounds normal. No respiratory distress. He has no wheezes. He has no rales.  Abdominal: Soft. He exhibits no distension. There is no tenderness. There is no rebound and no guarding.  Musculoskeletal: Normal range of motion. He exhibits no edema or tenderness.  Neurological: He is alert and oriented to person, place, and time.  Skin: Skin is warm and dry. No rash noted. No erythema.  Psychiatric: He has a normal mood and affect. His behavior is normal.  Nursing note and vitals reviewed.   ED Course  Procedures (including critical care time) Labs Review Labs Reviewed  CBC WITH DIFFERENTIAL/PLATELET - Abnormal; Notable for the following:    WBC 11.5 (*)    Hemoglobin 11.9 (*)    HCT 38.6 (*)    MCH 24.2 (*)    RDW 18.6 (*)    Neutro Abs 9.4 (*)    All other components within normal limits  BASIC METABOLIC PANEL - Abnormal; Notable for the following:    Glucose, Bld 125 (*)    GFR calc non Af Amer 60 (*)    All other components within normal limits  CBG  MONITORING, ED - Abnormal; Notable for the following:    Glucose-Capillary 121 (*)    All other components within normal limits  Randolm Idol, ED    Imaging Review Dg Chest Port 1 View  03/24/2016  CLINICAL DATA:  Syncope.  Left arm today.  Initial encounter. EXAM: PORTABLE CHEST 1 VIEW COMPARISON:  PA and lateral chest 12/03/2015 and 02/22/2015. FINDINGS: The lungs are clear. Heart size is upper normal. No pneumothorax or pleural effusion. No focal bony abnormality. IMPRESSION: No acute disease. Electronically Signed   By: Inge Rise M.D.   On: 03/24/2016 14:10   I have personally reviewed and evaluated these images and lab results as part of my medical decision-making.   EKG Interpretation   Date/Time:  Tuesday March 24 2016 12:08:33 EDT Ventricular Rate:  49 PR Interval:  174 QRS Duration: 100 QT Interval:  506 QTC Calculation: 457 R Axis:   54 Text Interpretation:  Marked sinus bradycardia Left ventricular  hypertrophy with repolarization abnormality , new T wave inversion Lateral  leads Confirmed by Maryan Rued  MD, Loree Fee ([id number]) on 03/24/2016 12:39:11 PM      MDM   Final diagnoses:  Syncope, unspecified syncope type    Patient is a 75 year old male coming in today with syncope. He did have prodromal symptoms of feeling lightheaded and nauseated. He had no symptoms of chest pain, palpitations or irregular heartbeat. This has happened before he was seated in 2015 and diagnosed at that time with a vagal response. He states approximately one month ago they changed one of his blood pressure medications but as far as he knows he been tolerating it fine. Her complaints. He denies any dehydration but states that he does not drink water often. No alcohol or drug use. EKG does show marked sinus bradycardia in the 40s and 50s which is unchanged from a prior EKG.  Patient also has a history of mitral regurgitation and last echo Unknown. Patient has no murmurs on exam but looking  back several years patient has long-standing bradycardia.  CBG today 121. Patient states he did eat before this occurred. Patient does not take beta blocker therapy.  CBC, BMP, troponin, chest x-ray pending. Patient may have been hypotensive when EMS arrived however here his blood pressure is normal.  2:49 PM Labs without acute findings.  HR did increase to 70 from 50 with sitting but does not change with standing and no BP changes.  Feel at this time pt can be d/ced home but will get appt with cardiology for f/u and echo.  Spoke with Wannetta Sender and cards will call him to set up appointment.  Blanchie Dessert, MD 03/24/16 1500

## 2016-03-24 NOTE — ED Notes (Signed)
Pt ambulated in hallway without difficulty.  Steady gait

## 2016-03-24 NOTE — Discharge Instructions (Signed)

## 2016-03-24 NOTE — ED Notes (Signed)
IV discontinued, local dressing applied.  

## 2016-03-24 NOTE — ED Notes (Signed)
From home via [facility], wife reported syncope with 5 min LOC, A/O X4 on arrival, no obvious trauma, c/o left arm pain (chronic) and low back pain, arrives in c-collar and on LSB, VSS, CBG 130, 18g RAC, ?orthostatic changes with EMS

## 2016-03-24 NOTE — ED Notes (Signed)
PT refuses to ambulate prior to discharge. PT denies dizziness when he stands to transfer to wheelchair

## 2016-03-24 NOTE — ED Notes (Signed)
Wife at bedside.  Wife describing some seizure like activity to EMT when he went in to do OVS. RN aware and EDP at bedside.

## 2016-06-16 ENCOUNTER — Ambulatory Visit (INDEPENDENT_AMBULATORY_CARE_PROVIDER_SITE_OTHER): Payer: Medicare Other | Admitting: Podiatry

## 2016-06-16 ENCOUNTER — Encounter: Payer: Self-pay | Admitting: Podiatry

## 2016-06-16 VITALS — BP 155/87 | HR 60 | Resp 12

## 2016-06-16 DIAGNOSIS — G5762 Lesion of plantar nerve, left lower limb: Secondary | ICD-10-CM

## 2016-06-16 DIAGNOSIS — G5792 Unspecified mononeuropathy of left lower limb: Secondary | ICD-10-CM

## 2016-06-16 MED ORDER — HYDROCODONE-ACETAMINOPHEN 10-325 MG PO TABS: 1.0000 | ORAL_TABLET | Freq: Four times a day (QID) | ORAL | Status: DC | PRN

## 2016-06-16 NOTE — Progress Notes (Signed)
He presents today with chief complaint painful fourth digit of the left foot. He states that he was doing well until just the other day. He states that he has not slept in 3 nights because of the pain in that toe.  Objective: Vital signs are stable he's alert and oriented 3 pulses are palpable. He has pain on palpation third interdigital space of the left foot.  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  Plan: Injected another dose of dehydrated alcohol to the third interspace of the left foot. Also wrote prescription for Norco.

## 2016-07-11 IMAGING — CR DG FINGER THUMB 2+V*R*
3 series · 3 of 3 positions shown · non-contrast
Comparison: None.

CLINICAL DATA: The patient slammed right thumb in a car door today
with distal right thumb pain

EXAM:
RIGHT THUMB 2+V

[finger ap]
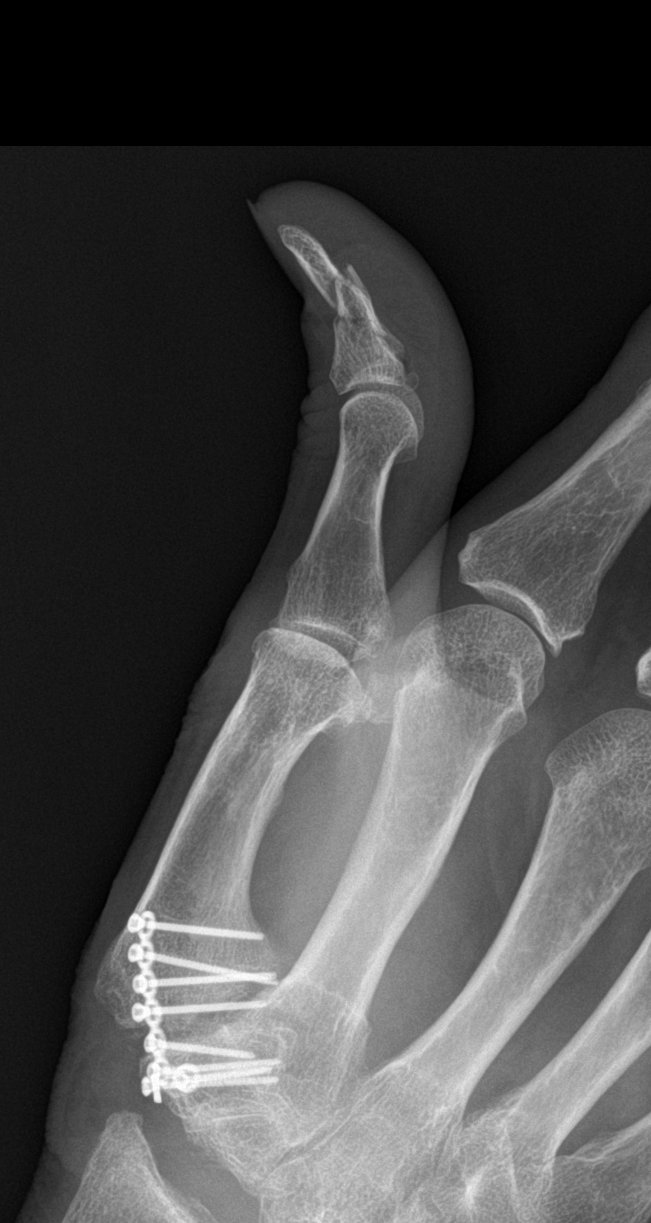

[finger obl]
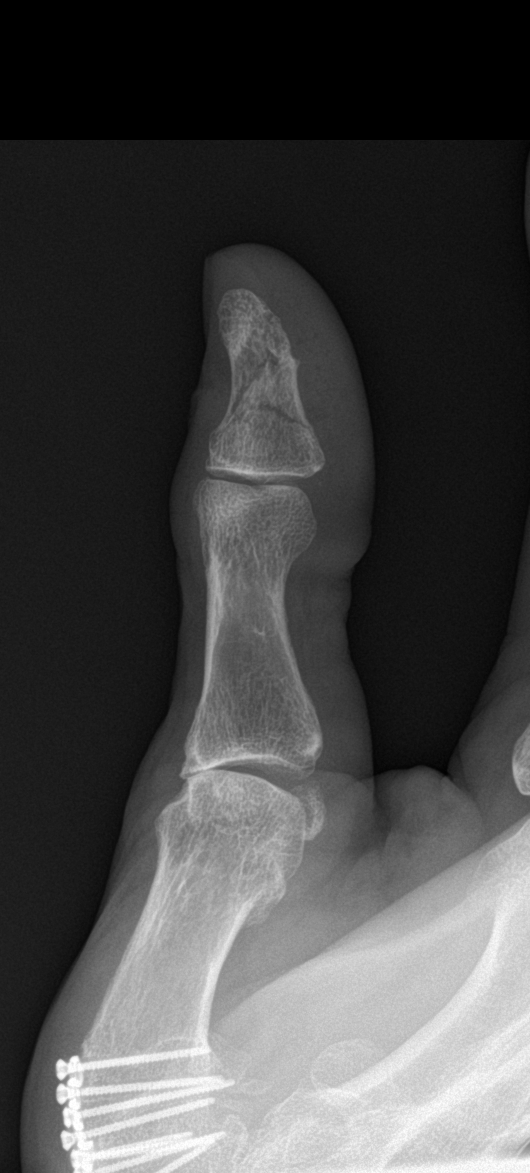

[finger lat]
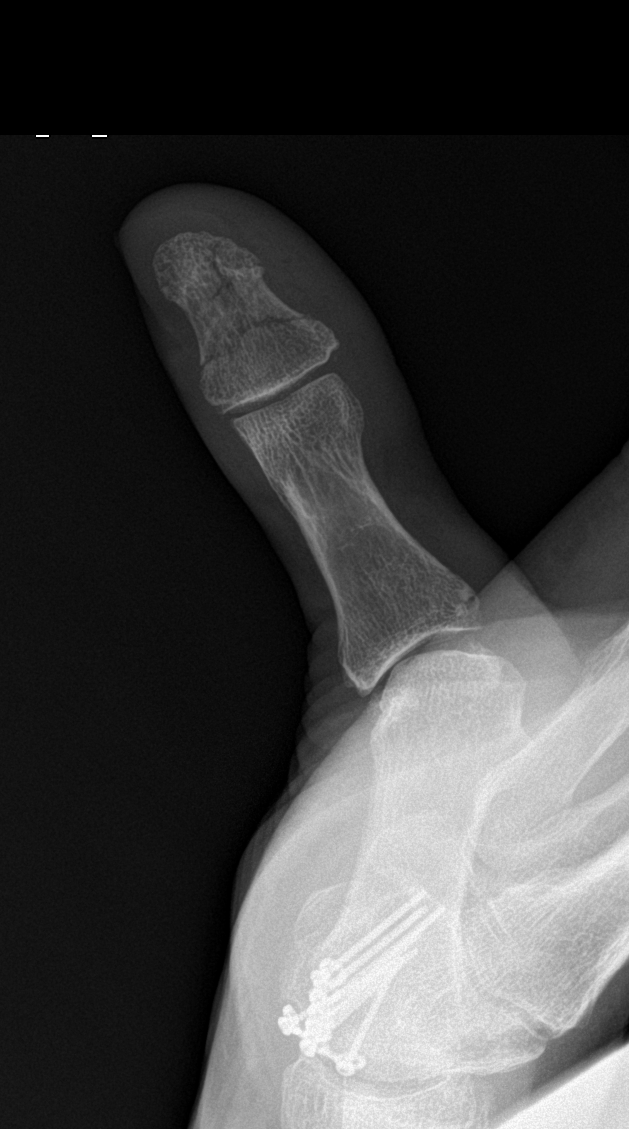

[3 of 3 positions shown; findings below may reference images not displayed]

FINDINGS: There is comminuted displaced fracture of the right first distal
phalanx. Associated soft tissue swelling is identified. Patient is
status post prior fixation of the first metacarpal-carpal joint.
IMPRESSION: Comminuted displaced fracture of the right first distal phalanx.

## 2016-07-14 ENCOUNTER — Encounter (HOSPITAL_COMMUNITY): Payer: Self-pay | Admitting: Emergency Medicine

## 2016-07-14 ENCOUNTER — Ambulatory Visit: Payer: Medicare Other | Admitting: Podiatry

## 2016-07-14 ENCOUNTER — Emergency Department (HOSPITAL_COMMUNITY)
Admission: EM | Admit: 2016-07-14 | Discharge: 2016-07-14 | Disposition: A | Discharge status: Home / Self Care | Payer: Medicare Other | Attending: Emergency Medicine | Admitting: Emergency Medicine

## 2016-07-14 DIAGNOSIS — Z8546 Personal history of malignant neoplasm of prostate: Secondary | ICD-10-CM | POA: Insufficient documentation

## 2016-07-14 DIAGNOSIS — F17210 Nicotine dependence, cigarettes, uncomplicated: Secondary | ICD-10-CM | POA: Insufficient documentation

## 2016-07-14 DIAGNOSIS — I10 Essential (primary) hypertension: Secondary | ICD-10-CM | POA: Insufficient documentation

## 2016-07-14 DIAGNOSIS — R55 Syncope and collapse: Secondary | ICD-10-CM | POA: Diagnosis not present

## 2016-07-14 DIAGNOSIS — R001 Bradycardia, unspecified: Secondary | ICD-10-CM | POA: Insufficient documentation

## 2016-07-14 DIAGNOSIS — J449 Chronic obstructive pulmonary disease, unspecified: Secondary | ICD-10-CM | POA: Diagnosis not present

## 2016-07-14 DIAGNOSIS — E119 Type 2 diabetes mellitus without complications: Secondary | ICD-10-CM | POA: Insufficient documentation

## 2016-07-14 DIAGNOSIS — I2510 Atherosclerotic heart disease of native coronary artery without angina pectoris: Secondary | ICD-10-CM | POA: Diagnosis not present

## 2016-07-14 LAB — BASIC METABOLIC PANEL
ANION GAP: 8 (ref 5–15)
BUN: 32 mg/dL — AB (ref 6–20)
CALCIUM: 9.6 mg/dL (ref 8.9–10.3)
CO2: 24 mmol/L (ref 22–32)
Chloride: 105 mmol/L (ref 101–111)
Creatinine, Ser: 1.5 mg/dL — ABNORMAL HIGH (ref 0.61–1.24)
GFR calc Af Amer: 51 mL/min — ABNORMAL LOW (ref >60)
GFR calc non Af Amer: 44 mL/min — ABNORMAL LOW (ref >60)
GLUCOSE: 114 mg/dL — AB (ref 65–99)
Potassium: 4.9 mmol/L (ref 3.5–5.1)
Sodium: 137 mmol/L (ref 135–145)

## 2016-07-14 LAB — CBC WITH DIFFERENTIAL/PLATELET
Basophils Absolute: 0 10*3/uL (ref 0.0–0.1)
Basophils Relative: 0 %
EOS PCT: 1 %
Eosinophils Absolute: 0.1 10*3/uL (ref 0.0–0.7)
HEMATOCRIT: 35.3 % — AB (ref 39.0–52.0)
Hemoglobin: 11.1 g/dL — ABNORMAL LOW (ref 13.0–17.0)
LYMPHS PCT: 28 %
Lymphs Abs: 2.6 10*3/uL (ref 0.7–4.0)
MCH: 25 pg — ABNORMAL LOW (ref 26.0–34.0)
MCHC: 31.4 g/dL (ref 30.0–36.0)
MCV: 79.5 fL (ref 78.0–100.0)
MONO ABS: 0.6 10*3/uL (ref 0.1–1.0)
MONOS PCT: 6 %
NEUTROS ABS: 5.8 10*3/uL (ref 1.7–7.7)
Neutrophils Relative %: 65 %
Platelets: 304 10*3/uL (ref 150–400)
RBC: 4.44 MIL/uL (ref 4.22–5.81)
RDW: 17.8 % — AB (ref 11.5–15.5)
WBC: 9.1 10*3/uL (ref 4.0–10.5)

## 2016-07-14 LAB — I-STAT TROPONIN, ED: Troponin i, poc: 0 ng/mL (ref 0.00–0.08)

## 2016-07-14 MED ORDER — SODIUM CHLORIDE 0.9 % IV BOLUS (SEPSIS)
500.0000 mL | Freq: Once | INTRAVENOUS | Status: AC
  Administered 2016-07-14: 500 mL via INTRAVENOUS

## 2016-07-14 NOTE — ED Notes (Signed)
placede patient into gown and on the monitor

## 2016-07-14 NOTE — ED Provider Notes (Signed)
[facility] DEPT Provider Note   CSN: [id number] Arrival date & time: 07/14/16  1440  First Provider Contact:  First MD Initiated Contact with Patient 07/14/16 1506        History   Chief Complaint Chief Complaint  Patient presents with  . Bradycardia  . Fatigue  . Abdominal Pain    HPI Russell [name] is a 75 y.o. male.  Patient with a history of diabetes, COPD, coronary artery disease, prior seizures and syncope, hyperlipidemia, hypertension presents with a near syncopal type episode. He states he was sitting in his electric wheelchair outside. He states he's sitting outside in the shade all day. He states he feels like he might of gotten overheated and got dizzy and lightheaded. Per report from EMS, his family called because he didn't look like himself. Patient remembers everything. He states he did not completely black out. He feels back to normal now. There is no witnessed seizure activity. He had one episode of vomiting prior to EMS arrival. He was complaining of some abdominal pain but states he does not have any abdominal pain currently. He denies any chest pain or shortness of breath. No headache. No ongoing dizziness or vertiginous type symptoms. No numbness or weakness to his extremities other than he has some chronic tingling in his hands which is unchanged from his baseline. He denies any speech deficits or vision changes. He was noted to be bradycardic in the 40s and 50s by EMS.    Abdominal Pain   Associated symptoms include nausea and vomiting. Pertinent negatives include fever, diarrhea, frequency, hematuria, headaches and arthralgias.    Past Medical History:  Diagnosis Date  . Arthritis    SHOUDLERS, LUMBAR  . BPH with obstruction/lower urinary tract symptoms   . COPD (chronic obstructive pulmonary disease) ([facility])   . Coronary atherosclerosis   . Depression   . Diverticulosis of colon   . ED (erectile dysfunction)   . Full dentures   . GERD  (gastroesophageal reflux disease)   . Glaucoma   . History of gastritis   . History of scabies    2008  . 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 History of syncope    03-22-2014  DX  VAGAL RESPONSE  . Hyperlipidemia, mixed   . Hypertension   . Mitral regurgitation   . Peripheral arterial disease ([place])    one vessel runoff bilaterally via peroneal arteries  . Prostate cancer ([place])   . Type 2 diabetes, diet controlled ([name])   . Wears glasses     Patient Active Problem List   Diagnosis Date Noted  . Peripheral arterial disease ([place]) 01/23/2016  . Fever 02/22/2015  . Epididymitis, left 02/22/2015  . Cellulitis 02/22/2015  . Orchitis 02/22/2015  . Testicular abscess 02/22/2015  . DM type 2 (diabetes mellitus, type 2) ([place]) 02/22/2015  . Microcytic anemia 02/22/2015  . Epididymitis 02/22/2015  . UTI (lower urinary tract infection) 02/22/2015  . Hypotension 03/22/2014  . Neuroma digital nerve 09/28/2013  . 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 DEHYDRATION 07/03/2010  . NAUSEA AND VOMITING 07/03/2010  . BENIGN PROSTATIC HYPERTROPHY, WITH URINARY OBSTRUCTION 06/13/2010  . ARM PAIN 06/03/2010  . DYSPNEA ON EXERTION 05/15/2010  . TOBACCO ABUSE 04/03/2010  . COPD 04/03/2010  . G E R D 04/03/2010  . OTHER DYSPHAGIA 04/03/2010  . PNEUMONIA, RIGHT LOWER LOBE 02/19/2010  . DIABETES MELLITUS, TYPE II, CONTROLLED 01/14/2010  .  LUMBAR RADICULOPATHY, LEFT 05/16/2009  . DYSPHAGIA UNSPECIFIED 05/16/2009  . COUGH DUE TO ACE INHIBITORS 04/02/2009  . DIZZINESS 07/27/2008  . HYPERCHOLESTEROLEMIA, MIXED 05/11/2008  . CATARACTS, BILATERAL 03/01/2008  . MILD SPINAL STENOSIS, CERVICAL 01/12/2008  . LEG CRAMPS 01/12/2008  . EARLY SATIETY 11/04/2007  . DIVERTICULOSIS, COLON 08/31/2007  . SCABIES 08/26/2007  . ERECTILE DYSFUNCTION 08/18/2007  . ABUSE, ALCOHOL SUSPECTED 08/18/2007  . Essential hypertension 08/17/2007  . HEMOCCULT POSITIVE  STOOL 07/05/2007  . ANEMIA, IRON DEFICIENCY NOS 05/31/2007  . WEIGHT LOSS, ABNORMAL 05/10/2006    Past Surgical History:  Procedure Laterality Date  . ANTERIOR CERVICAL DECOMP/DISCECTOMY FUSION  10-16-2009   C4 -- C6  . CAPSULOTOMY Right 01/26/2013   Procedure: MINOR CAPSULOTOMY;  Surgeon: Myrtha Mantis., MD;  Location: [place];  Service: Ophthalmology;  Laterality: Right;  . CARPECTOMY Right 03/13/2014   Procedure: RIGHT  PROXIMAL ROW CARPECTOMY;  Surgeon: Tennis Must, MD;  Location: [place];  Service: Orthopedics;  Laterality: Right;  . CARPECTOMY Left 10/22/2015   Procedure: LEFT WRIST PROXIMAL ROW CARPECTOMY ;  Surgeon: Leanora Cover, MD;  Location: [place];  Service: Orthopedics;  Laterality: Left;  . CARPOMETACARPAL ([place]) FUSION OF THUMB Right 03/13/2014   Procedure: RIGHT FUSION OF THUMB CARPOMETACARPAL [facility]) JOINT;  Surgeon: Tennis Must, MD;  Location: [place];  Service: Orthopedics;  Laterality: Right;  . CATARACT EXTRACTION W/ INTRAOCULAR LENS  IMPLANT, BILATERAL  2009  . COLONOSCOPY  08-31-2007  . CRYOABLATION N/A 01/14/2015   Procedure: CRYO ABLATION PROSTATE;  Surgeon: Ailene Rud, MD;  Location[facility];  Service: Urology;  Laterality: N/A;  . ESOPHAGOGASTRODUODENOSCOPY  last one 09-11-2008  . EXCISION MASS LEFT FACE , SUBORBITAL AREA, PLASTER RECONSTRUCTION  02-09-2011  . EXCISIONAL DEBRIDEMENT AND REPAIR RIGHT QUADRICEP TENDON  07-05-2000  . FOOT SURGERY Left   . I&D EXTREMITY Right 05/24/2013   Procedure: RIGHT INDEX WOUND DEBRIDEMENT AND CLOSURE;  Surgeon: Jolyn Nap, MD;  Location: [place];  Service: Orthopedics;  Laterality: Right;  . INSERTION OF SUPRAPUBIC CATHETER N/A 01/14/2015   Procedure: SUPRAPUBIC TUBE PLACEMENT;  Surgeon: Ailene Rud, MD;  Location[facility];  Service: Urology;  Laterality: N/A;  . KNEE ARTHROSCOPY Right 2008    . LARYNGOSCOPY AND ESOPHAGOSCOPY  06-13-2010   MARSUPIALIZATION LEFT LARGE VALLECULAR CYST  . MASS EXCISION Left 10/22/2015   Procedure: EXCISION MASS OF LEFT INDEX FINGER;  Surgeon: Leanora Cover, MD;  Location: [place];  Service: Orthopedics;  Laterality: Left;  . ORCHIECTOMY Left 02/24/2015   Procedure: SCROTAL EXPLORATION WITH LEFT ORCHIECTOMY;  Surgeon: Kathie Rhodes, MD;  Location: [place];  Service: Urology;  Laterality: Left;  . SHOULDER ARTHROSCOPY/ DEBRIDEMENT LABRAL TEAR/  BICEPS TENOTOMY Left 09-24-2011  . TONSILLECTOMY  as child  . TRANSTHORACIC ECHOCARDIOGRAM  02-21-2010   normal LVF/  ef 55-60%/ mild to moderate MR/  moderate LAE/  mild TR  . YAG LASER APPLICATION Right [id number]   Procedure: YAG LASER APPLICATION;  Surgeon: Myrtha Mantis., MD;  Location: [place];  Service: Ophthalmology;  Laterality: Right;  . YAG LASER CAPSULOTOMY, LEFT EYE  01-08-2011       Home Medications    Prior to Admission medications   Medication Sig Start Date End Date Taking? Authorizing Provider  albuterol (PROVENTIL HFA;VENTOLIN HFA) 108 (90 BASE) MCG/ACT inhaler Inhale 2 puffs into the lungs every 6 (six) hours as needed  for wheezing or shortness of breath. 04/10/13   Nishant Dhungel, MD  atorvastatin (LIPITOR) 80 MG tablet Take 80 mg by mouth daily.    Historical Provider, MD  azithromycin (ZITHROMAX) 250 MG tablet Take 1 tablet (250 mg total) by mouth daily. Take first 2 tablets together, then 1 every day until finished. 12/03/15   Comer Locket, PA-C  bimatoprost (LUMIGAN) 0.01 % SOLN Place 1 drop into both eyes at bedtime. 04/10/13   Nishant Dhungel, MD  brimonidine-timolol (COMBIGAN) 0.2-0.5 % ophthalmic solution Place 1 drop into both eyes 2 (two) times daily.     Historical Provider, MD  dorzolamide (TRUSOPT) 2 % ophthalmic solution Place 1 drop into the left eye 3 (three) times daily.  11/09/13   Historical Provider, MD  esomeprazole (NEXIUM) 40 MG capsule Take 40  mg by mouth every morning.     Historical Provider, MD  ferrous sulfate (SM IRON) 325 (65 FE) MG tablet Take 325 mg by mouth daily.    Historical Provider, MD  HYDROcodone-acetaminophen (NORCO) 10-325 MG tablet Take 1 tablet by mouth every 6 (six) hours as needed. 06/16/16   Max T Hyatt, DPM  HYDROcodone-acetaminophen (NORCO) 7.5-325 MG tablet Take 1 tablet by mouth every 6 (six) hours as needed. pain 12/02/15   Historical Provider, MD  losartan (COZAAR) 100 MG tablet Take 1 tablet (100 mg total) by mouth daily. Patient taking differently: Take 100 mg by mouth every morning.  04/10/13   Nishant Dhungel, MD  Multiple Vitamins-Minerals (CENTRUM SILVER ADULT 50+ PO) Take 1 tablet by mouth daily.    Historical Provider, MD  Omega-3 Fatty Acids (FISH OIL) 1000 MG CAPS Take 1 capsule by mouth every morning.     Historical Provider, MD  oxybutynin (DITROPAN-XL) 10 MG 24 hr tablet Take 10 mg by mouth at bedtime.    Historical Provider, MD  oxyCODONE-acetaminophen (PERCOCET) 5-325 MG tablet 1-2 tabs po q6 hours prn pain 10/22/15   Leanora Cover, MD  tamsulosin (FLOMAX) 0.4 MG CAPS capsule Take 0.4 mg by mouth every morning.  11/19/13   Historical Provider, MD    Family History Family History  Problem Relation Age of Onset  . Diabetes    . Cancer      Social History Social History  Substance Use Topics  . Smoking status: Current Every Day Smoker    Packs/day: 1.00    Years: 51.00    Types: Cigarettes  . Smokeless tobacco: Never Used  . Alcohol use No     Allergies   Review of patient's allergies indicates no known allergies.   Review of Systems Review of Systems  Constitutional: Negative for chills, diaphoresis, fatigue and fever.  HENT: Negative for congestion, rhinorrhea and sneezing.   Eyes: Negative.   Respiratory: Negative for cough, chest tightness and shortness of breath.   Cardiovascular: Negative for chest pain and leg swelling.  Gastrointestinal: Positive for abdominal pain,  nausea and vomiting. Negative for blood in stool and diarrhea.  Genitourinary: Negative for difficulty urinating, flank pain, frequency and hematuria.  Musculoskeletal: Negative for arthralgias and back pain.  Skin: Negative for rash.  Neurological: Positive for light-headedness. Negative for dizziness, speech difficulty, weakness, numbness and headaches.     Physical Exam Updated Vital Signs BP 133/70   Pulse (!) 53   Temp 97.3 F (36.3 C) (Oral)   Resp 17   SpO2 100%   Physical Exam  Constitutional: He is oriented to person, place, and time. He appears well-developed and well-nourished.  HENT:  Head: Normocephalic and atraumatic.  Eyes: Pupils are equal, round, and reactive to light.  Neck: Normal range of motion. Neck supple.  Cardiovascular: Regular rhythm and normal heart sounds.  Bradycardia present.   Pulmonary/Chest: Effort normal and breath sounds normal. No respiratory distress. He has no wheezes. He has no rales. He exhibits no tenderness.  Abdominal: Soft. Bowel sounds are normal. There is no tenderness. There is no rebound and no guarding.  Musculoskeletal: Normal range of motion. He exhibits no edema.  Lymphadenopathy:    He has no cervical adenopathy.  Neurological: He is alert and oriented to person, place, and time.  Motor 5 out of 5 all extremities, sensation grossly intact to light touch all extremities, finger-nose intact, no pronator Jeff  Skin: Skin is warm and dry. No rash noted.  Psychiatric: He has a normal mood and affect.     ED Treatments / Results  Labs (all labs ordered are listed, but only abnormal results are displayed) Results for orders placed or performed during the hospital encounter of [id number]  Basic metabolic panel  Result Value Ref Range   Sodium 137 135 - 145 mmol/L   Potassium 4.9 3.5 - 5.1 mmol/L   Chloride 105 101 - 111 mmol/L   CO2 24 22 - 32 mmol/L   Glucose, Bld 114 (H) 65 - 99 mg/dL   BUN 32 (H) 6 - 20 mg/dL   Creatinine,  Ser 1.50 (H) 0.61 - 1.24 mg/dL   Calcium 9.6 8.9 - 10.3 mg/dL   GFR calc non Af Amer 44 (L) >60 mL/min   GFR calc Af Amer 51 (L) >60 mL/min   Anion gap 8 5 - 15  CBC with Differential  Result Value Ref Range   WBC 9.1 4.0 - 10.5 K/uL   RBC 4.44 4.22 - 5.81 MIL/uL   Hemoglobin 11.1 (L) 13.0 - 17.0 g/dL   HCT 35.3 (L) 39.0 - 52.0 %   MCV 79.5 78.0 - 100.0 fL   MCH 25.0 (L) 26.0 - 34.0 pg   MCHC 31.4 30.0 - 36.0 g/dL   RDW 17.8 (H) 11.5 - 15.5 %   Platelets 304 150 - 400 K/uL   Neutrophils Relative % 65 %   Neutro Abs 5.8 1.7 - 7.7 K/uL   Lymphocytes Relative 28 %   Lymphs Abs 2.6 0.7 - 4.0 K/uL   Monocytes Relative 6 %   Monocytes Absolute 0.6 0.1 - 1.0 K/uL   Eosinophils Relative 1 %   Eosinophils Absolute 0.1 0.0 - 0.7 K/uL   Basophils Relative 0 %   Basophils Absolute 0.0 0.0 - 0.1 K/uL  I-stat troponin, ED  Result Value Ref Range   Troponin i, poc 0.00 0.00 - 0.08 ng/mL   Comment 3           No results found.   EKG  EKG Interpretation  Date/Time:  Tuesday July 14 2016 14:54:04 EDT Ventricular Rate:  43 PR Interval:    QRS Duration: 109 QT Interval:  570 QTC Calculation: 483 R Axis:   60 Text Interpretation:  Sinus bradycardia Left ventricular hypertrophy Abnrm T, consider ischemia, anterolateral lds since last tracing no significant change Confirmed by Jessamy Torosyan  MD, Chrishun Scheer ([id number]) on 07/14/2016 3:05:02 PM       Radiology No results found.  Procedures Procedures (including critical care time)  Medications Ordered in ED Medications  sodium chloride 0.9 % bolus 500 mL (0 mLs Intravenous Stopped 07/14/16 1649)     Initial Impression / Assessment  and Plan / ED Course  I have reviewed the triage vital signs and the nursing notes.  Pertinent labs & imaging results that were available during my care of the patient were reviewed by me and considered in my medical decision making (see chart for details).  Clinical Course    Patient presents with a near  syncopal type episode after sitting outside all day. He feels like it's related to heat. He denies any other symptoms. He's neurologically intact. He doesn't have any symptoms that we more concerning for acute coronary syndrome. He is noted to be bradycardic but not significantly orthostatic. Of note I reviewed his last cardiology visit in his heart rate was in the 40s at that time. His heart rate was also in 40s on one of his recent EKGs. He doesn't seem to be symptomatic from this. His heart rate ranges from the 40s to the 60s in the ED today. His creatinine is mildly elevated from his prior values. I encouraged him to drink water and follow-up with his PCP. Return precautions were given. He initially had a urinalysis ordered but he says he is ready to go and is adamant about not staying here any longer. He denies any urinary symptoms.  Final Clinical Impressions(s) / ED Diagnoses   Final diagnoses:  Near syncope  Bradycardia    New Prescriptions New Prescriptions   No medications on file     Malvin Johns, MD 07/14/16 1730

## 2016-07-14 NOTE — ED Triage Notes (Signed)
Per [facility] pt in hover round outside, they were called out by family because "he didn't look like himself, we figure he had a stroke." Pt has been outside all day.  Pt a/o x4 with bradycardia 48-56 on monitor, weakness and abdominal pain. Vomited once prior to EMS contact, reports years of abdominal pain.

## 2016-07-14 NOTE — ED Notes (Signed)
MD at bedside. 

## 2016-07-14 NOTE — ED Notes (Signed)
Sandwich and sprite given. 

## 2016-07-21 ENCOUNTER — Ambulatory Visit (INDEPENDENT_AMBULATORY_CARE_PROVIDER_SITE_OTHER): Payer: Medicare Other | Admitting: Podiatry

## 2016-07-21 ENCOUNTER — Encounter: Payer: Self-pay | Admitting: Podiatry

## 2016-07-21 DIAGNOSIS — G5762 Lesion of plantar nerve, left lower limb: Secondary | ICD-10-CM

## 2016-07-21 NOTE — Progress Notes (Signed)
Russell [name] presents today for a follow-up of his painful fourth digit of his left foot.  Objective: Vital signs are stable he is alert and oriented 3 pulses are palpable. His pain on palpation dorsal digital nerves fourth digit of the left foot.  Assessment: Neuritis/neuroma third interspace left foot.  Plan: I injected the distal most aspect of the webspace with dehydrated alcohol third interdigital space left foot.

## 2016-08-11 ENCOUNTER — Ambulatory Visit (INDEPENDENT_AMBULATORY_CARE_PROVIDER_SITE_OTHER): Payer: Medicare Other | Admitting: Podiatry

## 2016-08-11 ENCOUNTER — Encounter: Payer: Self-pay | Admitting: Podiatry

## 2016-08-11 VITALS — BP 136/56 | HR 67 | Resp 16

## 2016-08-11 DIAGNOSIS — G5762 Lesion of plantar nerve, left lower limb: Secondary | ICD-10-CM | POA: Diagnosis not present

## 2016-08-11 DIAGNOSIS — G5792 Unspecified mononeuropathy of left lower limb: Secondary | ICD-10-CM | POA: Diagnosis not present

## 2016-08-11 NOTE — Progress Notes (Signed)
   He presents today with chief complaint of neuritis to his fourth digit left foot. He states that this toe is feels like walking on a stone.  Objective: Vital signs are stable he's alert and oriented 3 he has pain on palpation fourth toe left foot.  Assessment: Neuritis fourth digit left foot.  Plan: Injected today with dehydrated alcohol follow-up with him in 2 weeks for additional injection

## 2016-08-12 DIAGNOSIS — G5603 Carpal tunnel syndrome, bilateral upper limbs: Secondary | ICD-10-CM

## 2016-08-12 DIAGNOSIS — M19131 Post-traumatic osteoarthritis, right wrist: Secondary | ICD-10-CM | POA: Insufficient documentation

## 2016-08-12 HISTORY — DX: Carpal tunnel syndrome, bilateral upper limbs: G56.03

## 2016-08-25 ENCOUNTER — Ambulatory Visit: Payer: Medicare Other | Admitting: Podiatry

## 2016-09-01 ENCOUNTER — Encounter: Payer: Self-pay | Admitting: Podiatry

## 2016-09-01 ENCOUNTER — Ambulatory Visit (INDEPENDENT_AMBULATORY_CARE_PROVIDER_SITE_OTHER): Payer: Medicare Other | Admitting: Podiatry

## 2016-09-01 DIAGNOSIS — G5762 Lesion of plantar nerve, left lower limb: Secondary | ICD-10-CM

## 2016-09-01 DIAGNOSIS — G5792 Unspecified mononeuropathy of left lower limb: Secondary | ICD-10-CM

## 2016-09-01 NOTE — Progress Notes (Signed)
He presents today with continued pain to the third interdigital space of the left foot.  Objective: Vital signs are stable he is alert 3 still has pain to the third interspace of the left of the palpable Mulder's click the pain seems to be extending more proximally.  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  Plan: Injected with dehydrated alcohol proximal third interdigital space follow-up with him in a month if necessary.

## 2016-09-24 ENCOUNTER — Ambulatory Visit: Payer: Medicare Other | Admitting: Podiatry

## 2016-10-01 ENCOUNTER — Encounter: Payer: Self-pay | Admitting: Podiatry

## 2016-10-01 ENCOUNTER — Ambulatory Visit (INDEPENDENT_AMBULATORY_CARE_PROVIDER_SITE_OTHER): Payer: Medicare Other | Admitting: Podiatry

## 2016-10-01 DIAGNOSIS — G5762 Lesion of plantar nerve, left lower limb: Secondary | ICD-10-CM | POA: Diagnosis not present

## 2016-10-01 DIAGNOSIS — L600 Ingrowing nail: Secondary | ICD-10-CM | POA: Diagnosis not present

## 2016-10-01 NOTE — Patient Instructions (Signed)

## 2016-10-01 NOTE — Progress Notes (Signed)
He presents today for follow-up of his neuroma to his left foot. He states there is just a source it always has been as is this ingrown toenail to the right side as he points to the left hallux tibial border. He states that her sabbatical hardly walk. He denies change in his past medical history medications or allergies.  Objective: Vital signs are stable he is alert and oriented 3. Pulses are palpable. Neurologic sensorium is intact. He has a neuroma to the third interspace of the left foot which is exquisitely tender. He also has an ingrown toenail to the tibial border of the hallux right with mild paronychia no purulence and no malodor just exquisite tenderness on palpation and nail margin is incurvated.  Assessment: Ingrown nail paronychia Says hallux right. Neuroma third interspace left.  Plan: I injected him from the plantar aspect of the left foot with dehydrated alcohol between the third and fourth metatarsals. Hopefully this will alleviate his symptoms from this different angle. I also performed a chemical matrixectomy to the tibial border. He was provided with both oral and written home-going instructions for care and soaking of his toes we did discuss possible postop complications which may include but are not limited to postop pain bleeding swelling infection recurrence need for further surgery loss of digit loss of limb loss of wife associated with diabetes peripheral vascular disease and neuropathy. All up with him in 1 week.

## 2016-10-02 ENCOUNTER — Other Ambulatory Visit: Payer: Self-pay | Admitting: Orthopedic Surgery

## 2016-10-02 DIAGNOSIS — M5412 Radiculopathy, cervical region: Secondary | ICD-10-CM

## 2016-10-15 ENCOUNTER — Ambulatory Visit
Admission: RE | Admit: 2016-10-15 | Discharge: 2016-10-15 | Disposition: A | Discharge status: Home / Self Care | Payer: Medicare Other | Source: Ambulatory Visit | Attending: Orthopedic Surgery | Admitting: Orthopedic Surgery

## 2016-10-15 DIAGNOSIS — M5412 Radiculopathy, cervical region: Secondary | ICD-10-CM

## 2016-10-19 ENCOUNTER — Other Ambulatory Visit: Payer: Self-pay | Admitting: Orthopedic Surgery

## 2016-10-20 ENCOUNTER — Other Ambulatory Visit: Payer: Self-pay | Admitting: Orthopedic Surgery

## 2016-10-21 ENCOUNTER — Other Ambulatory Visit: Payer: Self-pay | Admitting: Orthopedic Surgery

## 2016-10-21 DIAGNOSIS — M542 Cervicalgia: Secondary | ICD-10-CM

## 2016-10-22 ENCOUNTER — Encounter: Payer: Self-pay | Admitting: Podiatry

## 2016-10-22 ENCOUNTER — Ambulatory Visit (INDEPENDENT_AMBULATORY_CARE_PROVIDER_SITE_OTHER): Payer: Medicare Other | Admitting: Podiatry

## 2016-10-22 DIAGNOSIS — G5792 Unspecified mononeuropathy of left lower limb: Secondary | ICD-10-CM | POA: Diagnosis not present

## 2016-10-22 DIAGNOSIS — G5762 Lesion of plantar nerve, left lower limb: Secondary | ICD-10-CM | POA: Diagnosis not present

## 2016-10-22 NOTE — Progress Notes (Signed)
Russell [name] presents today for follow-up of his matrixectomy tibial border hallux left states that he continues to soak daily. He states that it feels a little sore but much better than it did previously. He states that his left neuroma is still painful.  Objective: Vital signs are stable he is alert and oriented 3. Pulses are nonpalpable with delayed capillary fill time. Neurologic sensorium is intact. Degenerative flexor intact. Palpable Mulder's click third interdigital space of the left foot is present. Tibial border hallux left. Healing very nicely no erythema cellulitis drainage or odor.  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  Plan: I injected with Celestone Soluspan today. I suggested that he continue soak his left foot in Epsom salts and warm water

## 2016-10-23 ENCOUNTER — Ambulatory Visit
Admission: RE | Admit: 2016-10-23 | Discharge: 2016-10-23 | Disposition: A | Discharge status: Home / Self Care | Payer: Medicare Other | Source: Ambulatory Visit | Attending: Orthopedic Surgery | Admitting: Orthopedic Surgery

## 2016-10-23 DIAGNOSIS — M542 Cervicalgia: Secondary | ICD-10-CM

## 2016-10-27 ENCOUNTER — Encounter (HOSPITAL_COMMUNITY): Payer: Self-pay | Admitting: Vascular Surgery

## 2016-10-27 ENCOUNTER — Ambulatory Visit (HOSPITAL_COMMUNITY)
Admission: RE | Admit: 2016-10-27 | Discharge: 2016-10-27 | Disposition: A | Discharge status: Home / Self Care | Payer: Medicare Other | Source: Ambulatory Visit | Attending: Orthopedic Surgery | Admitting: Orthopedic Surgery

## 2016-10-27 ENCOUNTER — Encounter (HOSPITAL_COMMUNITY)
Admission: RE | Admit: 2016-10-27 | Discharge: 2016-10-27 | Disposition: A | Discharge status: Home / Self Care | Payer: Medicare Other | Source: Ambulatory Visit | Attending: Orthopedic Surgery | Admitting: Orthopedic Surgery

## 2016-10-27 ENCOUNTER — Encounter (HOSPITAL_COMMUNITY): Payer: Self-pay

## 2016-10-27 DIAGNOSIS — R001 Bradycardia, unspecified: Secondary | ICD-10-CM | POA: Diagnosis not present

## 2016-10-27 DIAGNOSIS — I10 Essential (primary) hypertension: Secondary | ICD-10-CM | POA: Insufficient documentation

## 2016-10-27 DIAGNOSIS — F329 Major depressive disorder, single episode, unspecified: Secondary | ICD-10-CM | POA: Insufficient documentation

## 2016-10-27 DIAGNOSIS — I739 Peripheral vascular disease, unspecified: Secondary | ICD-10-CM | POA: Insufficient documentation

## 2016-10-27 DIAGNOSIS — Z01818 Encounter for other preprocedural examination: Secondary | ICD-10-CM | POA: Insufficient documentation

## 2016-10-27 DIAGNOSIS — Z01812 Encounter for preprocedural laboratory examination: Secondary | ICD-10-CM | POA: Diagnosis present

## 2016-10-27 DIAGNOSIS — N400 Enlarged prostate without lower urinary tract symptoms: Secondary | ICD-10-CM | POA: Insufficient documentation

## 2016-10-27 DIAGNOSIS — J449 Chronic obstructive pulmonary disease, unspecified: Secondary | ICD-10-CM | POA: Diagnosis not present

## 2016-10-27 DIAGNOSIS — E785 Hyperlipidemia, unspecified: Secondary | ICD-10-CM | POA: Diagnosis not present

## 2016-10-27 DIAGNOSIS — Z01810 Encounter for preprocedural cardiovascular examination: Secondary | ICD-10-CM | POA: Diagnosis not present

## 2016-10-27 DIAGNOSIS — F17200 Nicotine dependence, unspecified, uncomplicated: Secondary | ICD-10-CM | POA: Diagnosis not present

## 2016-10-27 DIAGNOSIS — K219 Gastro-esophageal reflux disease without esophagitis: Secondary | ICD-10-CM | POA: Insufficient documentation

## 2016-10-27 HISTORY — DX: Other obstructive and reflux uropathy: N13.8

## 2016-10-27 HISTORY — DX: Benign prostatic hyperplasia with lower urinary tract symptoms: N40.1

## 2016-10-27 HISTORY — DX: Dyspnea, unspecified: R06.00

## 2016-10-27 LAB — CBC WITH DIFFERENTIAL/PLATELET
BASOS ABS: 0.1 10*3/uL (ref 0.0–0.1)
Basophils Relative: 0 %
Eosinophils Absolute: 0.2 10*3/uL (ref 0.0–0.7)
Eosinophils Relative: 2 %
HEMATOCRIT: 38.4 % — AB (ref 39.0–52.0)
HEMOGLOBIN: 12.2 g/dL — AB (ref 13.0–17.0)
LYMPHS ABS: 3.3 10*3/uL (ref 0.7–4.0)
LYMPHS PCT: 29 %
MCH: 25.1 pg — ABNORMAL LOW (ref 26.0–34.0)
MCHC: 31.8 g/dL (ref 30.0–36.0)
MCV: 79 fL (ref 78.0–100.0)
Monocytes Absolute: 0.7 10*3/uL (ref 0.1–1.0)
Monocytes Relative: 7 %
NEUTROS ABS: 7 10*3/uL (ref 1.7–7.7)
NEUTROS PCT: 62 %
PLATELETS: 269 10*3/uL (ref 150–400)
RBC: 4.86 MIL/uL (ref 4.22–5.81)
RDW: 17.7 % — ABNORMAL HIGH (ref 11.5–15.5)
WBC: 11.3 10*3/uL — AB (ref 4.0–10.5)

## 2016-10-27 LAB — COMPREHENSIVE METABOLIC PANEL
ALK PHOS: 69 U/L (ref 38–126)
ALT: 28 U/L (ref 17–63)
AST: 16 U/L (ref 15–41)
Albumin: 3.8 g/dL (ref 3.5–5.0)
Anion gap: 11 (ref 5–15)
BILIRUBIN TOTAL: 0.4 mg/dL (ref 0.3–1.2)
BUN: 15 mg/dL (ref 6–20)
CALCIUM: 9.7 mg/dL (ref 8.9–10.3)
CHLORIDE: 103 mmol/L (ref 101–111)
CO2: 24 mmol/L (ref 22–32)
CREATININE: 0.9 mg/dL (ref 0.61–1.24)
GFR calc Af Amer: >60 mL/min (ref >60)
Glucose, Bld: 100 mg/dL — ABNORMAL HIGH (ref 65–99)
Potassium: 3.8 mmol/L (ref 3.5–5.1)
Sodium: 138 mmol/L (ref 135–145)
Total Protein: 7 g/dL (ref 6.5–8.1)

## 2016-10-27 LAB — URINALYSIS, ROUTINE W REFLEX MICROSCOPIC
Bilirubin Urine: NEGATIVE
Glucose, UA: NEGATIVE mg/dL
KETONES UR: NEGATIVE mg/dL
NITRITE: POSITIVE — AB
PH: 5.5 (ref 5.0–8.0)
PROTEIN: NEGATIVE mg/dL
Specific Gravity, Urine: 1.013 (ref 1.005–1.030)

## 2016-10-27 LAB — ABO/RH: ABO/RH(D): O POS

## 2016-10-27 LAB — TYPE AND SCREEN
ABO/RH(D): O POS
Antibody Screen: NEGATIVE

## 2016-10-27 LAB — PROTIME-INR
INR: 0.99
PROTHROMBIN TIME: 13.1 s (ref 11.4–15.2)

## 2016-10-27 LAB — SURGICAL PCR SCREEN
MRSA, PCR: NEGATIVE
Staphylococcus aureus: NEGATIVE

## 2016-10-27 LAB — URINE MICROSCOPIC-ADD ON

## 2016-10-27 LAB — APTT: APTT: 28 s (ref 24–36)

## 2016-10-27 NOTE — Progress Notes (Signed)
Anesthesia PAT Evaluation: Patient is a 75 year old male scheduled for C3-4 ACDF on 10/28/16 and posterior C3-4, C4-5, C5-6 fusion on 10/29/16 by Dr. Lynann Bologna.   History includes mitral regurgitation (mild-moderate '11), CAD (denied), smoking, bradycardia, HTN, HLD, PAD, GERD, BPH, COPD, depression, glaucoma, DM2 (denied). He has had two ED visit this year for syncope (03/24/16) or near syncope (07/14/16) and history of syncope/"seizure" 03/2014, prostate cancer s/p cryoablation and SP tube 01/14/15, left orchiectomy 02/24/15. (Per [facility], DM and CAD were added during a 01/11/15 encounter--although patient and his wife deny. I am also unable to find evidence to support a CAD diagnosis. A1c was 5.9 on 02/24/15.) He denied ETOH use for several years.   PCP is Dr. Kevan Ny.  He does not have a primary cardiologist per se, but he has seen cardiologists at [facility] over the past few years. First he was seen by Rosaria Ferries, PA-C with Dr. Daneen Schick in April 2015 when he presented to the ED with brief loss of consciousness, mild CP, hypotension and bradycardia. ER questioned cardiogenic shock. Dr. Tamala Julian did not think patient had cardiogenic shock and felt syncope was likely vasovagal. He recommended overnight stay with troponins and then would determine need for further work-up, but patient left AMA. During his 03/2016 ED visit for syncope, the ED MD recommended patient have outpatient cardiology evaluation for bradycardia and follow-up echocardiogram. Cardiology ([facility]) was even contacted to facilitate getting an appointment, but for unclear reasons patient was never seen. Most recently, he was evaluated by Dr. Quay Burow in 01/2016, but specifically for evaluation of PAD without claudication, rest pain, or open wound. No endovascular therapy recommended at that time with PRN follow-up.   BP (!) 156/85   Pulse (!) 41   Temp 36.6 C   Resp 20   Ht 6\' 2"  (1.88 m)   Wt 163 lb 3.2  oz (74 kg)   SpO2 99%   BMI 20.95 kg/m  Was told machine initially read HR in the 30's on arrival with manual HR of 42 bpm. He denied current dizziness, SOB, CP. Patient is a tall black male in NAD. He is restless/pacing (wants to leave hospital). Wife is at side. Heart RRR. I did not appreciate a murmur. Lungs clear. No ankle edema.   EKG 10/27/16: Junctional rhythm, LVH, prolonged QT (QT 468, QTc 482 ms). Lateral T wave abnormality improved since 07/14/16. Junctional rhythm appears new.   Echo 02/21/10: Study Conclusions - Left ventricle: The cavity size was normal. Wall thickness was  normal. Systolic function was normal. The estimated ejection  fraction was in the range of 55% to 60%. - Mitral valve: Bileaflet prolapse worse in the posterior leaftlet  with mild-moderate anteriorly directed MR Mild to moderate  regurgitation. - Left atrium: The atrium was moderately dilated. - Atrial septum: No defect or patent foramen ovale was identified.  BLE arterial Duplex 01/20/16: 50-74% right mid SFA stenosis. 30-49% left SFA disease without focal stenosis. Occluded bilateral anterior and posterior tibial arteries. One-vessel runoff, bilaterally.  CXR 10/27/16: PENDING.  Preoperative labs noted. Cr 0.90. H/H 12.2/38.2. PT/PTT WNL. Glucose 100. UA showed large leukocytes, nitrites. Positive UA called to [facility] at Dr. Laurena Bering office. Defer treatment recommendations to surgeon or he can forward to patient's PCP.   Patient was bradycardic or arrival and was in a new junctional rhythm by EKG. He has had recurrent syncope/near syncope and has not been evaluated by cardiology for this. I am concerned he is  having symptomatic bradycardia. I contacted on-call cardiologist Dr. Burt Knack to discuss. He reviewed EKG and information available with recommendation to postpone surgery (if surgery is not urgent/emergent) and have patient seen out-patient for event monitor, echo, and face-to-face evaluation.  Dr. Burt Knack will have his staff contact patient to set up an office visit. I reviewed above with patient and his wife. He is currently asymptomatic from a CV standpoint, so he will be sent home from PAT, but was advised to contact EMS if he has recurrent syncope/near syncope. I also spoke with Dr. Laurena Bering PA. Patient does have some radiculopathy symptoms, but not acute. It is felt surgeries can be safely postponed for a short time.   [facility] Short Stay Center/Anesthesiology Phone [phone] 10/27/2016 12:46 PM

## 2016-10-27 NOTE — Progress Notes (Signed)
Russell [name] denies chest pain or shortness of breath.  Patient states that he does feel lightheaded at times, "I just lay down and it goes away."  Patient denies feeling lightheaded today.  Patients finger are cool, it was difficult to pick up pulse ox and Heart Rate.  Apical Pulse was 42.  EKG repeated today. Shelby Dubin. PA notified.

## 2016-10-27 NOTE — Pre-Procedure Instructions (Signed)
    Russell [name]  10/27/2016     Your procedure is scheduled on Wednesday, November 8.  Report to [facility] Admitting at [facility]                Your surgery or procedure is scheduled for 2:00 PM   Call this number if you have problems the morning of surgery:[phone]     Remember:  Do not eat food or drink liquids after midnight.  Take these medicines the morning of surgery with A SIP OF WATER: May take HYDROcodone-acetaminophen [facility]) if needed for pain and you tolerate it on an empty stomach.  May use Eye Drops.                 Stop taking Aspirin, Vitaminsand herbal medications.  Do not take any NSAIDS ie:  Ibuprofen, Advil, Naproxen.   Do not wear jewelry, make-up or nail polish.  Do not wear lotions, powders, or perfumes, or deodorant.  Do not shave 48 hours prior to surgery.  Men may shave face and neck.  Do not bring valuables to the hospital.  [facility] is not responsible for any belongings or valuables.  Contacts, dentures or bridgework may not be worn into surgery.  Leave your suitcase in the car.  After surgery it may be brought to your room.  For patients admitted to the hospital, discharge time will be determined by your treatment team.  Special instructions: Review  [place] - Preparing For Surgery.  Please read over the following fact sheets that you were given. [place]- Preparing For Surgery and Patient Instructions for Mupirocin Application, Incentive Spirometry, Pain Booklet

## 2016-10-28 ENCOUNTER — Encounter (HOSPITAL_COMMUNITY): Admission: RE | Payer: Self-pay | Source: Ambulatory Visit

## 2016-10-28 ENCOUNTER — Inpatient Hospital Stay (HOSPITAL_COMMUNITY): Admission: RE | Admit: 2016-10-28 | Payer: Medicare Other | Source: Ambulatory Visit | Admitting: Orthopedic Surgery

## 2016-10-28 SURGERY — ANTERIOR CERVICAL DECOMPRESSION/DISCECTOMY FUSION 1 LEVEL
Anesthesia: General

## 2016-10-29 ENCOUNTER — Encounter (HOSPITAL_COMMUNITY): Admission: RE | Payer: Self-pay | Source: Ambulatory Visit

## 2016-10-29 SURGERY — POSTERIOR CERVICAL FUSION/FORAMINOTOMY LEVEL 3
Anesthesia: General

## 2016-11-03 NOTE — Progress Notes (Signed)
Cardiology Office Note   Date:  11/05/2016   ID:  Russell [name], DOB 1941-12-17, MRN [id number]  PCP:  Rogers Blocker, MD  Cardiologist:  Dr Tamala Julian saw once 2015, Dr Gwenlyn Found 01/2016 for PV Raena Pau, Suanne Marker, PA-C   Chief Complaint  Patient presents with  . Pre-op Exam    Clearance    History of Present Illness: Russell [name] is a 75 y.o. 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 PAD w/ L-ABI 0.51 01/2016 s/p evaluation by Dr Gwenlyn Found w/ no surgery on L foot due to high risk of lack of healing, prn f/u recommended.   Hx bradycardia, seen in ER 03/2014 for syncope w/ sinus brady, HR 54, felt vasovagal, no further eval. ECG 09/2015 w/ sinus brady, HR 40. ER visit for syncope 03/2016 w/ pt becoming light-headed when he got out of bed, sx worsened, diaphoretic>>lost consciousness for 5" per ER notes. 06/2016 ER visit for near-syncope but pt had been sitting outside for a long time on a hot day.   Pt tolerated L hand surgery w/out problems. 06/2016 ECG w/ sinus brady, HR 43  Russell [name] presents for evaluation of bradycardia and preop evaluation. Previously scheduled for C3-4 ACDF on 10/28/16 and posterior C3-4, C4-5, C5-6 fusion on 10/29/16 by Dr. Lynann Bologna. His L arm bothers him quite a bit, keeps him from sleeping. He has difficulty moving it and getting clothes on and off over his head.   He never has an awareness of his heart rate, but will get dizzy spells at times. They sometimes happen when he gets up out of a chair, but never when he gets up out of bed. He will also get them when he is sitting on the couch. He will go lie down and the symptoms improve. The symptoms have been severe enough at times to make him think he will pass out. He gets symptoms like this about once/week.   He occasionally gets L chest pain. It is not exertional and he has never had it when he  was working or doing anything. It will occur while he is lying on the couch and is not related to meals or food. It resolves without intervention.  He still smokes, trying to quit. He gets SOB raking leaves, the symptoms are relieved by rest. His legs do not swell. He denies orthopnea or PND. The SOB has not changed recently. It does not bother him unless he does unusual or more strenuous activity.   His L leg will hurt from the mid tibial region to the foot. It hurts with and without ambulation, but is worse when supine. He does not have any significant pain in his R leg. He denies numbness. He follows with a podiatrist.  Pt states it is very hard for him to get to the [place] office, he can get to [facility] much more easily.   Past Medical History:  Diagnosis Date  . Arthritis    SHOUDLERS, LUMBAR  . BPH with obstruction/lower urinary tract symptoms   . BPH with urinary obstruction 01/15/2016  . COPD (chronic obstructive pulmonary disease) ([place])   . Coronary atherosclerosis    denied knowledge of this 10/27/16  . Depression   . Diverticulosis of colon   . Dyspnea   . ED (erectile dysfunction)   . Full dentures   . GERD (gastroesophageal reflux disease)  not current  . Glaucoma   . History of gastritis   . History of scabies    2008  . 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 History of syncope    03-22-2014  DX  VAGAL RESPONSE  . Hyperlipidemia, mixed   . Hypertension   . Mitral regurgitation   . Peripheral arterial disease ([place])    one vessel runoff bilaterally via peroneal arteries  . Prostate cancer ([place])   . Seizures ([place]) 03/2014   ? or syncope- patient refused to be admitted for further studies- "felt better"  . Type 2 diabetes, diet controlled ([place])    patient and his wife said no- have not been told   . Wears glasses     Past Surgical History:  Procedure Laterality Date  . ANTERIOR CERVICAL DECOMP/DISCECTOMY FUSION  10-16-2009   C4  -- C6  . CAPSULOTOMY Right 01/26/2013   Procedure: MINOR CAPSULOTOMY;  Surgeon: Myrtha Mantis., MD;  Location: [place];  Service: Ophthalmology;  Laterality: Right;  . CARPECTOMY Right 03/13/2014   Procedure: RIGHT  PROXIMAL ROW CARPECTOMY;  Surgeon: Tennis Must, MD;  Location: [place];  Service: Orthopedics;  Laterality: Right;  . CARPECTOMY Left 10/22/2015   Procedure: LEFT WRIST PROXIMAL ROW CARPECTOMY ;  Surgeon: Leanora Cover, MD;  Location: [place];  Service: Orthopedics;  Laterality: Left;  . CARPOMETACARPAL ([place]) FUSION OF THUMB Right 03/13/2014   Procedure: RIGHT FUSION OF THUMB CARPOMETACARPAL [facility]) JOINT;  Surgeon: Tennis Must, MD;  Location: [place];  Service: Orthopedics;  Laterality: Right;  . CATARACT EXTRACTION W/ INTRAOCULAR LENS  IMPLANT, BILATERAL  2009  . COLONOSCOPY  08-31-2007  . COLONOSCOPY    . CRYOABLATION N/A 01/14/2015   Procedure: CRYO ABLATION PROSTATE;  Surgeon: Ailene Rud, MD;  Location: [facility];  Service: Urology;  Laterality: N/A;  . ESOPHAGOGASTRODUODENOSCOPY  last one 09-11-2008  . EXCISION MASS LEFT FACE , SUBORBITAL AREA, PLASTER RECONSTRUCTION  02-09-2011  . EXCISIONAL DEBRIDEMENT AND REPAIR RIGHT QUADRICEP TENDON  07-05-2000  . FOOT SURGERY Left   . I&D EXTREMITY Right 05/24/2013   Procedure: RIGHT INDEX WOUND DEBRIDEMENT AND CLOSURE;  Surgeon: Jolyn Nap, MD;  Location: [place];  Service: Orthopedics;  Laterality: Right;  . INSERTION OF SUPRAPUBIC CATHETER N/A 01/14/2015   Procedure: SUPRAPUBIC TUBE PLACEMENT;  Surgeon: Ailene Rud, MD;  Location: [facility];  Service: Urology;  Laterality: N/A;  . KNEE ARTHROSCOPY Right 2008  . LARYNGOSCOPY AND ESOPHAGOSCOPY  06-13-2010   MARSUPIALIZATION LEFT LARGE VALLECULAR CYST  . MASS EXCISION Left 10/22/2015   Procedure: EXCISION MASS OF LEFT INDEX FINGER;  Surgeon: Leanora Cover, MD;   Location: [place];  Service: Orthopedics;  Laterality: Left;  . ORCHIECTOMY Left 02/24/2015   Procedure: SCROTAL EXPLORATION WITH LEFT ORCHIECTOMY;  Surgeon: Kathie Rhodes, MD;  Location: [place];  Service: Urology;  Laterality: Left;  . SHOULDER ARTHROSCOPY/ DEBRIDEMENT LABRAL TEAR/  BICEPS TENOTOMY Left 09-24-2011  . TONSILLECTOMY  as child  . TRANSTHORACIC ECHOCARDIOGRAM  02-21-2010   normal LVF/  ef 55-60%/ mild to moderate MR/  moderate LAE/  mild TR  . YAG LASER APPLICATION Right [id number]   Procedure: YAG LASER APPLICATION;  Surgeon: Myrtha Mantis., MD;  Location: [place];  Service: Ophthalmology;  Laterality: Right;  . YAG LASER CAPSULOTOMY, LEFT EYE  01-08-2011  Current Outpatient Prescriptions  Medication Sig Dispense Refill  . albuterol (PROVENTIL HFA;VENTOLIN HFA) 108 (90 BASE) MCG/ACT inhaler Inhale 2 puffs into the lungs every 6 (six) hours as needed for wheezing or shortness of breath.    Marland Kitchen atorvastatin (LIPITOR) 80 MG tablet Take 80 mg by mouth daily.    . bimatoprost (LUMIGAN) 0.01 % SOLN Place 1 drop into both eyes at bedtime. 30 mL 3  . brimonidine-timolol (COMBIGAN) 0.2-0.5 % ophthalmic solution Place 1 drop into both eyes 2 (two) times daily.     . CHANTIX STARTING MONTH PAK 0.5 MG X 11 & 1 MG X 42 tablet Take by mouth as directed.     . dorzolamide (TRUSOPT) 2 % ophthalmic solution Place 1 drop into the left eye 3 (three) times daily.     . ferrous sulfate (SM IRON) 325 (65 FE) MG tablet Take 325 mg by mouth daily.    Marland Kitchen gabapentin (NEURONTIN) 300 MG capsule Take 1 capsule by mouth 2 (two) times daily.  3  . HYDROcodone-acetaminophen (NORCO) 7.5-325 MG tablet Take 1 tablet by mouth every 8 (eight) hours as needed for moderate pain. pain    . losartan (COZAAR) 100 MG tablet Take 1 tablet by mouth every morning.  3  . Multiple Vitamins-Minerals (CENTRUM SILVER ADULT 50+ PO) Take 1 tablet by mouth daily.    . Omega-3 Fatty Acids (FISH OIL) 1000 MG CAPS  Take 1 capsule by mouth every morning.     . pravastatin (PRAVACHOL) 80 MG tablet Take 1 tablet by mouth every evening.  2   No current facility-administered medications for this visit.     Allergies:   Patient has no known allergies.    Social History:  The patient  reports that he has been smoking Cigarettes.  He has a 59.00 pack-year smoking history. He has never used smokeless tobacco. He reports that he does not drink alcohol or use drugs.   Family History:  The patient's family history includes Heart attack in his father; Unexplained death in his mother.    ROS:  Please see the history of present illness. All other systems are reviewed and negative.    PHYSICAL EXAM: VS:  BP 140/81   Pulse 76   Ht 6\' 2"  (1.88 m)   Wt 160 lb 12.8 oz (72.9 kg)   BMI 20.65 kg/m  , BMI Body mass index is 20.65 kg/m. GEN: Well nourished, well developed, male in no acute distress  HEENT: normal for age  Neck: no JVD, no carotid bruit, no masses Cardiac: RRR; soft murmur, no rubs, or gallops Respiratory: decreased BS bases bilaterally, normal work of breathing GI: soft, nontender, nondistended, + BS MS: no deformity or atrophy; no edema; distal pulses are 2+ in upper extremities, not palpable in both lower extrem. Feet are cool to touch. Skin: warm and dry, no rash Neuro:  Strength and sensation are intact Psych: euthymic mood, full affect   EKG:  EKG is ordered today. The ekg ordered today demonstrates sinus brady w/ sinus arrhythmia. HR 59 Borderline LVH, diffuse T wave flattening, no sig change from previous ECGs  ECHO: 02/21/2010 - Left ventricle: The cavity size was normal. Wall thickness was  normal. Systolic function was normal. The estimated ejection  fraction was in the range of 55% to 60%. - Mitral valve: Bileaflet prolapse worse in the posterior leaftlet  with mild-moderate anteriorly directed MR Mild to moderate  regurgitation. - Left atrium: The atrium was  moderately dilated. -  Atrial septum: No defect or patent foramen ovale was identified.   Recent Labs: 10/27/2016: ALT 28; BUN 15; Creatinine, Ser 0.90; Hemoglobin 12.2; Platelets 269; Potassium 3.8; Sodium 138    Lipid Panel    Component Value Date/Time   CHOL 194 12/20/2012 1345   TRIG 264 (H) 12/20/2012 1345   HDL 57 12/20/2012 1345   CHOLHDL 3.4 12/20/2012 1345   VLDL 53 (H) 12/20/2012 1345   LDLCALC 84 12/20/2012 1345     Wt Readings from Last 3 Encounters:  11/05/16 160 lb 12.8 oz (72.9 kg)  10/27/16 163 lb 3.2 oz (74 kg)  01/23/16 169 lb 1.6 oz (76.7 kg)     Other studies Reviewed: Additional studies/ records that were reviewed today include: Office notes, hospital records and testing.  ASSESSMENT AND PLAN:  1.  Near-syncope, documented bradycardia: He currently has no evidence of chronotropic incompetence and his BP has always been maintained, even with HR 40s. However, he is having regular dizziness and presyncope. Will get a 2 week event monitor and f/u on the results.    2. DOE and chest pain: his symptoms are atypical but his CAD risk is very high, (PAD plus multiple CRFs). He was offered a stress test but does not wish to have one right now. EF previously normal (2011). If echo still ok, watch for progression of symptoms.  3. MVP w/ Mod MR: ck echo to follow  4. PAD: LLE sx likely claudication, discuss Imdur or Pletal with MD  5. Preop evaluation: Pt not having any ischemic symptoms and is on a statin and ARB. No BB due to bradycardia, MD advise on ASA. Once data available from monitor (and echo), final surgical risk assessment can be completed.    Current medicines are reviewed at length with the patient today.  The patient does not have concerns regarding medicines.  The following changes have been made:  no change  Labs/ tests ordered today include:   Orders Placed This Encounter  Procedures  . Cardiac event monitor  . EKG 12-Lead  . ECHOCARDIOGRAM  COMPLETE     Disposition:   FU with Dr Tamala Julian after testing (pt prefers to be followed at [facility]) or extender. See Dr Gwenlyn Found for PAD, as needed.  Augusto Garbe  11/05/2016 11:39 AM    [facility] Phone: [phone]; Fax: [phone]  This note was written with the assistance of speech recognition software. Please excuse any transcriptional errors.

## 2016-11-05 ENCOUNTER — Ambulatory Visit (INDEPENDENT_AMBULATORY_CARE_PROVIDER_SITE_OTHER): Payer: Medicare Other | Admitting: Physician Assistant

## 2016-11-05 ENCOUNTER — Encounter (INDEPENDENT_AMBULATORY_CARE_PROVIDER_SITE_OTHER): Payer: Medicare Other

## 2016-11-05 ENCOUNTER — Encounter: Payer: Self-pay | Admitting: Physician Assistant

## 2016-11-05 VITALS — BP 140/81 | HR 76 | Ht 74.0 in | Wt 160.8 lb

## 2016-11-05 DIAGNOSIS — R55 Syncope and collapse: Secondary | ICD-10-CM

## 2016-11-05 DIAGNOSIS — R001 Bradycardia, unspecified: Secondary | ICD-10-CM | POA: Diagnosis not present

## 2016-11-05 DIAGNOSIS — R0609 Other forms of dyspnea: Secondary | ICD-10-CM

## 2016-11-05 DIAGNOSIS — I341 Nonrheumatic mitral (valve) prolapse: Secondary | ICD-10-CM

## 2016-11-05 DIAGNOSIS — R079 Chest pain, unspecified: Secondary | ICD-10-CM | POA: Diagnosis not present

## 2016-11-05 DIAGNOSIS — I739 Peripheral vascular disease, unspecified: Secondary | ICD-10-CM

## 2016-11-05 NOTE — Patient Instructions (Addendum)
Medication Instructions:  Continue current medications  Labwork: None Ordered  Testing/Procedures: Your physician has requested that you have an echocardiogram. Echocardiography is a painless test that uses sound waves to create images of your heart. It provides your doctor with information about the size and shape of your heart and how well your heart's chambers and valves are working. This procedure takes approximately one hour. There are no restrictions for this procedure.  Your physician has recommended that you wear an event monitor for 2 weeks. Event monitors are medical devices that record the heart's electrical activity. Doctors most often [place] these monitors to diagnose arrhythmias. Arrhythmias are problems with the speed or rhythm of the heartbeat. The monitor is a small, portable device. You can wear one while you do your normal daily activities. This is usually used to diagnose what is causing palpitations/syncope (passing out).    Follow-Up: Your physician wants you to follow-up in: After Echo and Monitor with Dr Tamala Julian. You will receive a reminder letter in the mail two months in advance. If you don't receive a letter, please call our office to schedule the follow-up appointment.    Any Other Special Instructions Will Be Listed Below (If Applicable).       Happy Thanksgiving  If you need a refill on your cardiac medications before your next appointment, please call your pharmacy.

## 2016-11-06 ENCOUNTER — Telehealth: Payer: Self-pay | Admitting: Physician Assistant

## 2016-11-06 NOTE — Telephone Encounter (Signed)
New message      Calling with an abnormal monitor reading

## 2016-11-06 NOTE — Telephone Encounter (Signed)
Call from [place] stating patient pressed button that he passed out; at the time, 7:04 am central time, his monitor showed SR 73, with ventricular run (3 beats) and 5 multifocal PVCs. They were not able to reach patient.  I called patient. He states he feels fine. He did not pass out. While he was asleep, he was sweating and the stickers came off his chest.  He replaced them.   He has no concerns at this time.

## 2016-11-19 ENCOUNTER — Ambulatory Visit (INDEPENDENT_AMBULATORY_CARE_PROVIDER_SITE_OTHER): Payer: Medicare Other | Admitting: Podiatry

## 2016-11-19 ENCOUNTER — Encounter: Payer: Self-pay | Admitting: Podiatry

## 2016-11-19 DIAGNOSIS — G5762 Lesion of plantar nerve, left lower limb: Secondary | ICD-10-CM | POA: Diagnosis not present

## 2016-11-20 NOTE — Progress Notes (Signed)
Mr. 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 This is actually a stump neuroma and he states that he still continues to hurt.  Objective: Vital signs are stable he is alert and oriented 3. Pulses are palpable. But the pulses are very thready. His feet are cool to the touch and toes have a delayed return.  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  Plan: I injected 2 mL of dehydrated alcohol today. Follow up with him in 1 month. Be sure to ask him how his heart is doing.

## 2016-11-20 NOTE — Addendum Note (Signed)
Addended by: Diana Eves on: 11/20/2016 09:55 AM   Modules accepted: Orders

## 2016-11-30 ENCOUNTER — Ambulatory Visit: Payer: Medicare Other | Admitting: Physician Assistant

## 2016-12-02 ENCOUNTER — Ambulatory Visit (HOSPITAL_COMMUNITY): Payer: Medicare Other | Attending: Cardiology

## 2016-12-02 ENCOUNTER — Other Ambulatory Visit: Payer: Self-pay

## 2016-12-02 DIAGNOSIS — R0609 Other forms of dyspnea: Secondary | ICD-10-CM | POA: Diagnosis present

## 2016-12-02 DIAGNOSIS — R079 Chest pain, unspecified: Secondary | ICD-10-CM

## 2016-12-07 ENCOUNTER — Ambulatory Visit (INDEPENDENT_AMBULATORY_CARE_PROVIDER_SITE_OTHER): Payer: Medicare Other | Admitting: Physician Assistant

## 2016-12-07 ENCOUNTER — Encounter: Payer: Self-pay | Admitting: Physician Assistant

## 2016-12-07 VITALS — BP 140/90 | HR 78 | Ht 74.0 in | Wt 173.1 lb

## 2016-12-07 DIAGNOSIS — R001 Bradycardia, unspecified: Secondary | ICD-10-CM

## 2016-12-07 DIAGNOSIS — R0609 Other forms of dyspnea: Secondary | ICD-10-CM | POA: Diagnosis not present

## 2016-12-07 DIAGNOSIS — Z01818 Encounter for other preprocedural examination: Secondary | ICD-10-CM

## 2016-12-07 NOTE — Patient Instructions (Addendum)
Medication Instructions:  Your physician recommends that you continue on your current medications as directed. Please refer to the Current Medication list given to you today.   Labwork: None ordered  Testing/Procedures: Your physician has requested that you have en exercise stress myoview. For further information please visit [email or web address]. Please follow instruction sheet, as given.   Follow-Up: Your physician wants you to follow-up in: 4 MONTHS WITH DR. Gaspar Bidding will receive a reminder letter in the mail two months in advance. If you don't receive a letter, please call our office to schedule the follow-up appointment.   Any Other Special Instructions Will Be Listed Below (If Applicable).   Exercise Stress Electrocardiogram An exercise stress electrocardiogram is a test that is done to evaluate the blood supply to your heart. This test may also be called exercise stress electrocardiography. The test is done while you are walking on a treadmill. The goal of this test is to raise your heart rate. This test is done to find areas of poor blood flow to the heart by determining the extent of coronary artery disease (CAD). CAD is defined as narrowing in one or more heart (coronary) arteries of more than 70%. If you have an abnormal test result, this may mean that you are not getting adequate blood flow to your heart during exercise. Additional testing may be needed to understand why your test was abnormal. Tell a health care provider about:  Any allergies you have.  All medicines you are taking, including vitamins, herbs, eye drops, creams, and over-the-counter medicines.  Any problems you or family members have had with anesthetic medicines.  Any blood disorders you have.  Any surgeries you have had.  Any medical conditions you have.  Possibility of pregnancy, if this applies. What are the risks? Generally, this is a safe procedure. However, as with any procedure, complications  can occur. Possible complications can include:  Pain or pressure in the following areas:  Chest.  Jaw or neck.  Between your shoulder blades.  Radiating down your left arm.  Dizziness or light-headedness.  Shortness of breath.  Increased or irregular heartbeats.  Nausea or vomiting.  Heart attack (rare). What happens before the procedure?  Avoid all forms of caffeine 24 hours before your test or as directed by your health care provider. This includes coffee, tea (even decaffeinated tea), caffeinated sodas, chocolate, cocoa, and certain pain medicines.  Follow your health care provider's instructions regarding eating and drinking before the test.  Take your medicines as directed at regular times with water unless instructed otherwise. Exceptions may include:  If you have diabetes, ask how you are to take your insulin or pills. It is common to adjust insulin dosing the morning of the test.  If you are taking beta-blocker medicines, it is important to talk to your health care provider about these medicines well before the date of your test. Taking beta-blocker medicines may interfere with the test. In some cases, these medicines need to be changed or stopped 24 hours or more before the test.  If you wear a nitroglycerin patch, it may need to be removed prior to the test. Ask your health care provider if the patch should be removed before the test.  If you use an inhaler for any breathing condition, bring it with you to the test.  If you are an outpatient, bring a snack so you can eat right after the stress phase of the test.  Do not smoke for 4 hours prior to  the test or as directed by your health care provider.  Do not apply lotions, powders, creams, or oils on your chest prior to the test.  Wear loose-fitting clothes and comfortable shoes for the test. This test involves walking on a treadmill. What happens during the procedure?  Multiple patches (electrodes) will be put  on your chest. If needed, small areas of your chest may have to be shaved to get better contact with the electrodes. Once the electrodes are attached to your body, multiple wires will be attached to the electrodes and your heart rate will be monitored.  Your heart will be monitored both at rest and while exercising.  You will walk on a treadmill. The treadmill will be started at a slow pace. The treadmill speed and incline will gradually be increased to raise your heart rate. What happens after the procedure?  Your heart rate and blood pressure will be monitored after the test.  You may return to your normal schedule including diet, activities, and medicines, unless your health care provider tells you otherwise. This information is not intended to replace advice given to you by your health care provider. Make sure you discuss any questions you have with your health care provider. Document Released: 12/04/2000 Document Revised: 05/14/2016 Document Reviewed: 08/14/2013 Elsevier Interactive Patient Education  2017 [employer].    If you need a refill on your cardiac medications before your next appointment, please call your pharmacy.

## 2016-12-07 NOTE — Progress Notes (Signed)
Cardiology Office Note    Date:  12/07/2016   ID:  Russell [name], DOB [birth year], MRN [id number]  PCP:  Rogers Blocker, MD  Cardiologist: Dr. Tamala Julian 2015 [place] Dr. Gwenlyn Found  Chief Complaint  Patient presents with  . Follow-up    History of Present Illness:  Russell [name] is a 75 y.o. 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 PAD w/ L-ABI 0.51 01/2016 s/p evaluation by Dr Gwenlyn Found w/ no surgery on L foot due to high risk of lack of healing, prn f/u recommended.    Hx bradycardia, seen in ER 03/2014 for syncope w/ sinus brady, HR 54, felt vasovagal, no further eval. ECG 09/2015 w/ sinus brady, HR 40. ER visit for syncope 03/2016 w/ pt becoming light-headed when he got out of bed, sx worsened, diaphoretic>>lost consciousness for 5" per ER notes. 06/2016 ER visit for near-syncope but pt had been sitting outside for a long time on a hot day.   He saw Rosaria Ferries, PA 11/05/16 for evaluation of bradycardia preop before undergoing spinal fusion. He was also having near-syncope. Placed a 2 week monitor and recommended a stress test because of dyspnea on exertion and chest pain but he declined. He says he rakes leaves all the time and gives himself a regular stress test.   He tells me his dizziness usually occurs when he's working in the yard and gets short of breath. He denies any chest pain.  2-D echo 12/02/16 showed normal LVEF 50% with inferolateral and anterolateral walls mildly hypokinetic with grade 1 DD, trivial MR. 2 week monitor showed sinus bradycardia at 48 bpm to sinus tachycardia at 116 bpm with PVCs no significant pauses.He did have 5 PVCs and one minute with lead loss.     Past Medical History:  Diagnosis Date  . Arthritis    SHOUDLERS, LUMBAR  . BPH with obstruction/lower urinary tract symptoms   . BPH with urinary obstruction 01/15/2016  . COPD (chronic  obstructive pulmonary disease) ([place])   . Coronary atherosclerosis    denied knowledge of this 10/27/16  . Depression   . Diverticulosis of colon   . Dyspnea   . ED (erectile dysfunction)   . Full dentures   . GERD (gastroesophageal reflux disease)    not current  . Glaucoma   . History of gastritis   . History of scabies    2008  . 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 History of syncope    03-22-2014  DX  VAGAL RESPONSE  . Hyperlipidemia, mixed   . Hypertension   . Mitral regurgitation   . Peripheral arterial disease ([place])    one vessel runoff bilaterally via peroneal arteries  . Prostate cancer ([place])   . Seizures ([name]) 03/2014   ? or syncope- patient refused to be admitted for further studies- "felt better"  . Type 2 diabetes, diet controlled ([place])    patient and his wife said no- have not been told   . Wears glasses     Past Surgical History:  Procedure Laterality Date  . ANTERIOR CERVICAL DECOMP/DISCECTOMY FUSION  10-16-2009   C4 -- C6  . CAPSULOTOMY Right 01/26/2013   Procedure: MINOR CAPSULOTOMY;  Surgeon: Myrtha Mantis., MD;  Location: [place];  Service: Ophthalmology;  Laterality: Right;  . CARPECTOMY Right 03/13/2014  Procedure: RIGHT  PROXIMAL ROW CARPECTOMY;  Surgeon: Tennis Must, MD;  Location: [place];  Service: Orthopedics;  Laterality: Right;  . CARPECTOMY Left 10/22/2015   Procedure: LEFT WRIST PROXIMAL ROW CARPECTOMY ;  Surgeon: Leanora Cover, MD;  Location: [place];  Service: Orthopedics;  Laterality: Left;  . CARPOMETACARPAL ([place]) FUSION OF THUMB Right 03/13/2014   Procedure: RIGHT FUSION OF THUMB CARPOMETACARPAL [facility]) JOINT;  Surgeon: Tennis Must, MD;  Location: [place];  Service: Orthopedics;  Laterality: Right;  . CATARACT EXTRACTION W/ INTRAOCULAR LENS  IMPLANT, BILATERAL  2009  . COLONOSCOPY  08-31-2007  . COLONOSCOPY    . CRYOABLATION N/A 01/14/2015   Procedure:  CRYO ABLATION PROSTATE;  Surgeon: Ailene Rud, MD;  Location[facility];  Service: Urology;  Laterality: N/A;  . ESOPHAGOGASTRODUODENOSCOPY  last one 09-11-2008  . EXCISION MASS LEFT FACE , SUBORBITAL AREA, PLASTER RECONSTRUCTION  02-09-2011  . EXCISIONAL DEBRIDEMENT AND REPAIR RIGHT QUADRICEP TENDON  07-05-2000  . FOOT SURGERY Left   . I&D EXTREMITY Right 05/24/2013   Procedure: RIGHT INDEX WOUND DEBRIDEMENT AND CLOSURE;  Surgeon: Jolyn Nap, MD;  Location: [place];  Service: Orthopedics;  Laterality: Right;  . INSERTION OF SUPRAPUBIC CATHETER N/A 01/14/2015   Procedure: SUPRAPUBIC TUBE PLACEMENT;  Surgeon: Ailene Rud, MD;  Location[facility]  Service: Urology;  Laterality: N/A;  . KNEE ARTHROSCOPY Right 2008  . LARYNGOSCOPY AND ESOPHAGOSCOPY  06-13-2010   MARSUPIALIZATION LEFT LARGE VALLECULAR CYST  . MASS EXCISION Left 10/22/2015   Procedure: EXCISION MASS OF LEFT INDEX FINGER;  Surgeon: Leanora Cover, MD;  Location: [place];  Service: Orthopedics;  Laterality: Left;  . ORCHIECTOMY Left 02/24/2015   Procedure: SCROTAL EXPLORATION WITH LEFT ORCHIECTOMY;  Surgeon: Kathie Rhodes, MD;  Location: [place];  Service: Urology;  Laterality: Left;  . SHOULDER ARTHROSCOPY/ DEBRIDEMENT LABRAL TEAR/  BICEPS TENOTOMY Left 09-24-2011  . TONSILLECTOMY  as child  . TRANSTHORACIC ECHOCARDIOGRAM  02-21-2010   normal LVF/  ef 55-60%/ mild to moderate MR/  moderate LAE/  mild TR  . YAG LASER APPLICATION Right [id number]   Procedure: YAG LASER APPLICATION;  Surgeon: Myrtha Mantis., MD;  Location: [place];  Service: Ophthalmology;  Laterality: Right;  . YAG LASER CAPSULOTOMY, LEFT EYE  01-08-2011    Current Medications: Outpatient Medications Prior to Visit  Medication Sig Dispense Refill  . albuterol (PROVENTIL HFA;VENTOLIN HFA) 108 (90 BASE) MCG/ACT inhaler Inhale 2 puffs into the lungs every 6 (six) hours as needed  for wheezing or shortness of breath.    . bimatoprost (LUMIGAN) 0.01 % SOLN Place 1 drop into both eyes at bedtime. 30 mL 3  . brimonidine-timolol (COMBIGAN) 0.2-0.5 % ophthalmic solution Place 1 drop into both eyes 2 (two) times daily.     . CHANTIX STARTING MONTH PAK 0.5 MG X 11 & 1 MG X 42 tablet Take by mouth as directed.     . dorzolamide (TRUSOPT) 2 % ophthalmic solution Place 1 drop into the left eye 3 (three) times daily.     . ferrous sulfate (SM IRON) 325 (65 FE) MG tablet Take 325 mg by mouth daily.    Marland Kitchen gabapentin (NEURONTIN) 300 MG capsule Take 1 capsule by mouth 2 (two) times daily.  3  . HYDROcodone-acetaminophen (NORCO) 7.5-325 MG tablet Take 1 tablet by mouth every 8 (eight) hours as needed for moderate pain. pain    .  losartan (COZAAR) 100 MG tablet Take 1 tablet by mouth every morning.  3  . Multiple Vitamins-Minerals (CENTRUM SILVER ADULT 50+ PO) Take 1 tablet by mouth daily.    . Omega-3 Fatty Acids (FISH OIL) 1000 MG CAPS Take 1 capsule by mouth every morning.     . pravastatin (PRAVACHOL) 80 MG tablet Take 1 tablet by mouth every evening.  2   No facility-administered medications prior to visit.      Allergies:   Patient has no known allergies.   Social History   Social History  . Marital status: Married    Spouse name: N/A  . Number of children: N/A  . Years of education: N/A   Occupational History  . Retired - poured concrete    Social History Main Topics  . Smoking status: Current Every Day Smoker    Packs/day: 1.00    Years: 59.00    Types: Cigarettes  . Smokeless tobacco: Never Used     Comment: Started Chantix 10/26/16  . Alcohol use No     Comment: none since approx 2014- heavy before  . Drug use: No  . Sexual activity: Not Asked   Other Topics Concern  . None   Social History Narrative   Lives with wife.     Family History:  The patient's   family history includes Heart attack in his father; Unexplained death in his mother.   ROS:     Please see the history of present illness.    Review of Systems  Constitution: Positive for weakness.  HENT: Negative.   Cardiovascular: Negative.   Respiratory: Negative.   Endocrine: Negative.   Hematologic/Lymphatic: Negative.   Musculoskeletal: Positive for arthritis, back pain, joint pain, muscle weakness and myalgias.  Gastrointestinal: Negative.   Genitourinary: Negative.    All other systems reviewed and are negative.   PHYSICAL EXAM:   VS:  BP 140/90 (BP Location: Right Arm, Patient Position: Sitting, Cuff Size: Normal)   Pulse 78   Ht 6\' 2"  (1.88 m)   Wt 173 lb 1.9 oz (78.5 kg)   SpO2 97%   BMI 22.23 kg/m   Physical Exam  GEN: Well nourished, well developed, in no acute distress  Neck: no JVD, carotid bruits, or masses Cardiac:RRR; 1/6 systolic murmur at the left sternal border Respiratory:  clear to auscultation bilaterally, normal work of breathing GI: soft, nontender, nondistended, + BS Ext: without cyanosis, clubbing, or edema, Good distal pulses bilaterally MS: no deformity or atrophy  Skin: warm and dry, no rash Neuro:  Alert and Oriented x 3, Strength and sensation are intact Psych: euthymic mood, full affect  Wt Readings from Last 3 Encounters:  12/07/16 173 lb 1.9 oz (78.5 kg)  11/05/16 160 lb 12.8 oz (72.9 kg)  10/27/16 163 lb 3.2 oz (74 kg)      Studies/Labs Reviewed:   EKG:  EKG is not ordered today.   Recent Labs: 10/27/2016: ALT 28; BUN 15; Creatinine, Ser 0.90; Hemoglobin 12.2; Platelets 269; Potassium 3.8; Sodium 138   Lipid Panel    Component Value Date/Time   CHOL 194 12/20/2012 1345   TRIG 264 (H) 12/20/2012 1345   HDL 57 12/20/2012 1345   CHOLHDL 3.4 12/20/2012 1345   VLDL 53 (H) 12/20/2012 1345   LDLCALC 84 12/20/2012 1345    Additional studies/ records that were reviewed today include:  2-D echo 12/02/16  Study Conclusions   - Left ventricle: The cavity size was normal. Wall thickness was  increased in a pattern of mild  LVH. The estimated ejection   fraction was 50%. Inferolateral and anterolateral walls appear   mildly hypokinetic. Doppler parameters are consistent with   abnormal left ventricular relaxation (grade 1 diastolic   dysfunction). - Aortic valve: There was no stenosis. - Aorta: Mildly dilated aortic root. Aortic root dimension: 38 mm   (ED). - Mitral valve: There was trivial regurgitation. - Right ventricle: The cavity size was normal. Systolic function   was normal. - Tricuspid valve: Peak RV-RA gradient (S): 16 mm Hg. - Pulmonary arteries: PA peak pressure: 19 mm Hg (S). - Inferior vena cava: The vessel was normal in size. The   respirophasic diameter changes were in the normal range (= 50%),   consistent with normal central venous pressure.   Impressions:   - Normal LV size with mild LV hypertrophy. EF 50% with mild   inferolateral and anterolateral hypokinesis. Normal RV size and   systolic function. No significant valvular abnormalities.      ASSESSMENT:    1. Preoperative clearance   2. Dyspnea on exertion   3. Bradycardia      PLAN:  In order of problems listed above:  Preoperative clearance patient has some wall motion abnormalities on 2-D echo, dyspnea on exertion and dizziness while working in the yard. Discussed with Dr. Tamala Julian who recommends nuclear stress test before clearing him for cervical neck surgery.  Dyspnea on exertion rule out anginal equivalent with stress test  Bradycardia with history of presyncope 2 week monitor reviewed his slowest heart rate was 48 bpm no significant bradycardia arrhythmias, pauses or syncope.He did have PVCs with 5 and 1 minute.    Medication Adjustments/Labs and Tests Ordered: Current medicines are reviewed at length with the patient today.  Concerns regarding medicines are outlined above.  Medication changes, Labs and Tests ordered today are listed in the Patient Instructions below. Patient Instructions  Medication  Instructions:  Your physician recommends that you continue on your current medications as directed. Please refer to the Current Medication list given to you today.   Labwork: None ordered  Testing/Procedures: Your physician has requested that you have en exercise stress myoview. For further information please visit [email or web address]. Please follow instruction sheet, as given.   Follow-Up: Your physician wants you to follow-up in: 4 MONTHS WITH DR. Gaspar Bidding will receive a reminder letter in the mail two months in advance. If you don't receive a letter, please call our office to schedule the follow-up appointment.   Any Other Special Instructions Will Be Listed Below (If Applicable).   Exercise Stress Electrocardiogram An exercise stress electrocardiogram is a test that is done to evaluate the blood supply to your heart. This test may also be called exercise stress electrocardiography. The test is done while you are walking on a treadmill. The goal of this test is to raise your heart rate. This test is done to find areas of poor blood flow to the heart by determining the extent of coronary artery disease (CAD). CAD is defined as narrowing in one or more heart (coronary) arteries of more than 70%. If you have an abnormal test result, this may mean that you are not getting adequate blood flow to your heart during exercise. Additional testing may be needed to understand why your test was abnormal. Tell a health care provider about:  Any allergies you have.  All medicines you are taking, including vitamins, herbs, eye drops, creams, and over-the-counter medicines.  Any problems you  or family members have had with anesthetic medicines.  Any blood disorders you have.  Any surgeries you have had.  Any medical conditions you have.  Possibility of pregnancy, if this applies. What are the risks? Generally, this is a safe procedure. However, as with any procedure, complications can occur.  Possible complications can include:  Pain or pressure in the following areas:  Chest.  Jaw or neck.  Between your shoulder blades.  Radiating down your left arm.  Dizziness or light-headedness.  Shortness of breath.  Increased or irregular heartbeats.  Nausea or vomiting.  Heart attack (rare). What happens before the procedure?  Avoid all forms of caffeine 24 hours before your test or as directed by your health care provider. This includes coffee, tea (even decaffeinated tea), caffeinated sodas, chocolate, cocoa, and certain pain medicines.  Follow your health care provider's instructions regarding eating and drinking before the test.  Take your medicines as directed at regular times with water unless instructed otherwise. Exceptions may include:  If you have diabetes, ask how you are to take your insulin or pills. It is common to adjust insulin dosing the morning of the test.  If you are taking beta-blocker medicines, it is important to talk to your health care provider about these medicines well before the date of your test. Taking beta-blocker medicines may interfere with the test. In some cases, these medicines need to be changed or stopped 24 hours or more before the test.  If you wear a nitroglycerin patch, it may need to be removed prior to the test. Ask your health care provider if the patch should be removed before the test.  If you use an inhaler for any breathing condition, bring it with you to the test.  If you are an outpatient, bring a snack so you can eat right after the stress phase of the test.  Do not smoke for 4 hours prior to the test or as directed by your health care provider.  Do not apply lotions, powders, creams, or oils on your chest prior to the test.  Wear loose-fitting clothes and comfortable shoes for the test. This test involves walking on a treadmill. What happens during the procedure?  Multiple patches (electrodes) will be put on your  chest. If needed, small areas of your chest may have to be shaved to get better contact with the electrodes. Once the electrodes are attached to your body, multiple wires will be attached to the electrodes and your heart rate will be monitored.  Your heart will be monitored both at rest and while exercising.  You will walk on a treadmill. The treadmill will be started at a slow pace. The treadmill speed and incline will gradually be increased to raise your heart rate. What happens after the procedure?  Your heart rate and blood pressure will be monitored after the test.  You may return to your normal schedule including diet, activities, and medicines, unless your health care provider tells you otherwise. This information is not intended to replace advice given to you by your health care provider. Make sure you discuss any questions you have with your health care provider. Document Released: 12/04/2000 Document Revised: 05/14/2016 Document Reviewed: 08/14/2013 Elsevier Interactive Patient Education  2017 [employer].    If you need a refill on your cardiac medications before your next appointment, please call your pharmacy.      Signed, Ermalinda Barrios, PA-C  12/07/2016 1:59 PM    [place] [id number]  [address] [place], [place]  [address] Phone: [phone]; Fax: [phone]

## 2016-12-15 ENCOUNTER — Telehealth (HOSPITAL_COMMUNITY): Payer: Self-pay | Admitting: *Deleted

## 2016-12-15 NOTE — Telephone Encounter (Signed)
Patient given detailed instructions per Myocardial Perfusion Study Information Sheet for the test on 12/17/16 at 1000. Patient notified to arrive 15 minutes early and that it is imperative to arrive on time for appointment to keep from having the test rescheduled.  If you need to cancel or reschedule your appointment, please call the office within 24 hours of your appointment. Failure to do so may result in a cancellation of your appointment, and a $50 no show fee. Patient verbalized understanding.Russell [name], [name]

## 2016-12-17 ENCOUNTER — Ambulatory Visit (HOSPITAL_COMMUNITY): Payer: Medicare Other | Attending: Cardiovascular Disease

## 2016-12-17 DIAGNOSIS — I10 Essential (primary) hypertension: Secondary | ICD-10-CM | POA: Insufficient documentation

## 2016-12-17 DIAGNOSIS — R0609 Other forms of dyspnea: Secondary | ICD-10-CM | POA: Diagnosis not present

## 2016-12-17 LAB — MYOCARDIAL PERFUSION IMAGING
CHL CUP NUCLEAR SDS: 4
CHL CUP RESTING HR STRESS: 52 {beats}/min
LHR: 0.34
LV dias vol: 150 mL (ref 62–150)
LVSYSVOL: 86 mL
NUC STRESS TID: 0.92
Peak HR: 70 {beats}/min
SRS: 3
SSS: 7

## 2016-12-17 MED ORDER — REGADENOSON 0.4 MG/5ML IV SOLN
0.4000 mg | Freq: Once | INTRAVENOUS | Status: AC
  Administered 2016-12-17: 0.4 mg via INTRAVENOUS

## 2016-12-17 MED ORDER — TECHNETIUM TC 99M TETROFOSMIN IV KIT
10.2000 | PACK | Freq: Once | INTRAVENOUS | Status: AC | PRN
  Administered 2016-12-17: 10.2 via INTRAVENOUS
  Filled 2016-12-17: qty 11

## 2016-12-17 MED ORDER — TECHNETIUM TC 99M TETROFOSMIN IV KIT
32.2000 | PACK | Freq: Once | INTRAVENOUS | Status: AC | PRN
  Administered 2016-12-17: 32.2 via INTRAVENOUS
  Filled 2016-12-17: qty 33

## 2016-12-24 ENCOUNTER — Telehealth: Payer: Self-pay | Admitting: Interventional Cardiology

## 2016-12-24 NOTE — Telephone Encounter (Signed)
Pt wanted to know when Dr. Tamala Julian was going to clear him for his neck surgery.  Advised pt that I received stress test this morning and was planning to call him soon.  Informed him of stress test results and that he had been cleared per Dr. Tamala Julian.  Inquired about who was doing surgery so I could send information to their office.  Pt states he does not know who is doing the surgery and did not remember the name of the office.  Upon looking in pt's chart, seen recent cancellation of surgery with Dr. Lynann Bologna and pt said he thinks that's correct.  Called Dr. Laurena Bering office and spoke with [place].  She did not see upcoming surgery scheduled.  Advised of cancellation in November and she was able to locate this information.  She provided me with fax number and will get information to surgery scheduler for Dr. Lynann Bologna.

## 2016-12-24 NOTE — Telephone Encounter (Signed)
Follow Up:    Pt wants to know when is Dr Tamala Julian going to do his surgery?

## 2016-12-29 ENCOUNTER — Ambulatory Visit: Payer: Medicare Other | Admitting: Physician Assistant

## 2016-12-31 ENCOUNTER — Encounter: Payer: Self-pay | Admitting: Podiatry

## 2016-12-31 ENCOUNTER — Ambulatory Visit (INDEPENDENT_AMBULATORY_CARE_PROVIDER_SITE_OTHER): Payer: Medicare Other | Admitting: Podiatry

## 2016-12-31 DIAGNOSIS — G5762 Lesion of plantar nerve, left lower limb: Secondary | ICD-10-CM | POA: Diagnosis not present

## 2016-12-31 NOTE — Progress Notes (Signed)
He presents today chief complaint painful area between the third and fourth toes of the left foot. He states it is some better after each injection but then he regresses. He goes on say that he will soon be having surgery on his neck and back which should help with his shoulder and possibly even this left foot.  Objective: Vital signs are stable alert and oriented 3. Pulses are palpable. He has pain on palpation to the third interdigital space left foot.  Assessment: Neuroma third interdigital space left.  Plan: Reinjected dehydrated alcohol today we'll follow-up with him in 3-4 weeks.

## 2017-01-11 ENCOUNTER — Telehealth: Payer: Self-pay | Admitting: Interventional Cardiology

## 2017-01-11 NOTE — Telephone Encounter (Signed)
Pt states he came to our office for clearance and was told that he was cleared but no one had contacted him about setting up surgery.  Advised pt he would need to call Dr. Laurena Bering office in regards to this as we are not the office doing the surgery.  Pt appreciative for help.

## 2017-01-11 NOTE — Telephone Encounter (Signed)
New message     Pt very confused said he was to have heart test done so that he can have surgery on his arm ??

## 2017-01-13 ENCOUNTER — Other Ambulatory Visit: Payer: Self-pay | Admitting: Orthopedic Surgery

## 2017-01-19 NOTE — Progress Notes (Addendum)
Anesthesia Note: Patient is a 76 year old male scheduled for C3-4 ACDF on 01/27/17 and posterior C3-4, C4-5, C5-6 fusion on 01/28/17 by Dr. Lynann Bologna. Procedures were initially scheduled for 10/28/16 and 10/29/16, but surgery was postponed until he could be evaluated by cardiology for recurrent syncope/near syncope and new junctional rhythm on preoperative EKG. (See my note from 10/27/16). He has since had an event monitor, stress, and echo and was cleared for surgery.   History includes mitral regurgitation (mild-moderate '11, trivial '17), CAD (denied), smoking, bradycardia, HTN, HLD, PAD, GERD, BPH, COPD, depression, glaucoma, DM2 (denied). He has had two ED visit this year for syncope (03/24/16) or near syncope (07/14/16) and history of syncope/"seizure" 03/2014, prostate cancer s/p cryoablation and SP tube 01/14/15, left orchiectomy 02/24/15. (Per [facility], DM and CAD were added during a 01/11/15 encounter--although patient and his wife deny. I am also unable to find evidence to support a CAD diagnosis. A1c was 5.9 on 02/24/15.) He denied ETOH use for several years.   - PCP is Dr. Kevan Ny. - Cardiologist is Dr. Daneen Schick. Most recent visits have been with Rosaria Ferries, PA-C on 11/05/16 and Ermalinda Barrios, PA-C on 12/07/16. Following recent testing, Dr. Tamala Julian wrote on 12/23/16, "Let the patient know the test shows evidence of prior heart damage but no active blockage. Cleared for upcoming neck surgery, but will be at slightly higher risk of cardiac complications." - PV cardiologist is Dr. Quay Burow, last visit 01/23/16 with [facility] cardiology follow-up recommended.  Meds include albuterol, Azopt ophthalmic, Lumigan ophthalmic, Combigan ophthalmic, Neurontin, losartan, Norco, fish oil, Ditropan, pravastatin, Flomax.  EKG 11/05/16: SB at 59 bpm with sinus arrhythmia, possible LAE, non-specific T wave abnormality.   Nuclear stress test 12/17/16:  Nuclear stress EF: 43%.  There was no  ST segment deviation noted during stress.  This is an intermediate risk study.  The left ventricular ejection fraction is moderately decreased (30-44%). 1. Fixed small, mild basal inferior perfusion defect.  This may be due to diaphragmatic attenuation.  No ischemia.  2. EF 43% with diffuse hypokinesis.  3. Intermediate risk study due to decreased LV systolic function.   Echo 12/02/16: Impressions: - Normal LV size with mild LV hypertrophy. EF 50% with mild   inferolateral and anterolateral hypokinesis. Normal RV size and   systolic function. No significant valvular abnormalities.  Event Monitor 11/05/16-11/18/16:   NSR with PAC and PVC activity. HR 48-116 bpm with PVCs. No significant pauses.   No atrial fib No significant abnormality  BLE arterial Duplex 01/20/16: 50-74% right mid SFA stenosis. 30-49% left SFA disease without focal stenosis. Occluded bilateral anterior and posterior tibial arteries. One-vessel runoff, bilaterally.  CXR 10/27/16: IMPRESSION: No acute abnormality noted.  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 Moderate foraminal encroachment bilaterally due to spurring. - ACDF C4-5 with moderate foraminal encroachment bilaterally - ACDF at C5-6 with severe right foraminal encroachment due to spurring - C6-7 spondylosis with severe foraminal encroachment bilaterally due to spurring.  Preoperative labs PENDING PAT appointment.  George Hugh [facility] Stay [facility]/Anesthesiology Phone [phone] 01/19/2017 5:38 PM   Addendum: 01/21/17 CXR and labs noted. Glucose 113. UA showed moderate leukocytes, negative nitrites (left voice message with Angela Nevin at Dr. Laurena Bering office). Patient with recent cardiac work-up and surgical clearance with "slightly higher risk". f no acute changes then I would anticipate that he  could  proceed as planned from an anesthesia standpoint.   George Hugh [facility] Short Stay Center/Anesthesiology Phone [phone] 01/22/2017 9:48 AM

## 2017-01-21 ENCOUNTER — Encounter (HOSPITAL_COMMUNITY)
Admission: RE | Admit: 2017-01-21 | Discharge: 2017-01-21 | Disposition: A | Discharge status: Home / Self Care | Payer: Medicare Other | Source: Ambulatory Visit | Attending: Orthopedic Surgery | Admitting: Orthopedic Surgery

## 2017-01-21 ENCOUNTER — Encounter (HOSPITAL_COMMUNITY): Payer: Self-pay

## 2017-01-21 DIAGNOSIS — K219 Gastro-esophageal reflux disease without esophagitis: Secondary | ICD-10-CM | POA: Insufficient documentation

## 2017-01-21 DIAGNOSIS — Z01818 Encounter for other preprocedural examination: Secondary | ICD-10-CM

## 2017-01-21 DIAGNOSIS — E785 Hyperlipidemia, unspecified: Secondary | ICD-10-CM | POA: Insufficient documentation

## 2017-01-21 DIAGNOSIS — I2510 Atherosclerotic heart disease of native coronary artery without angina pectoris: Secondary | ICD-10-CM | POA: Insufficient documentation

## 2017-01-21 DIAGNOSIS — I700 Atherosclerosis of aorta: Secondary | ICD-10-CM | POA: Diagnosis not present

## 2017-01-21 DIAGNOSIS — F17200 Nicotine dependence, unspecified, uncomplicated: Secondary | ICD-10-CM | POA: Diagnosis not present

## 2017-01-21 DIAGNOSIS — I10 Essential (primary) hypertension: Secondary | ICD-10-CM | POA: Diagnosis not present

## 2017-01-21 DIAGNOSIS — J449 Chronic obstructive pulmonary disease, unspecified: Secondary | ICD-10-CM | POA: Insufficient documentation

## 2017-01-21 DIAGNOSIS — N400 Enlarged prostate without lower urinary tract symptoms: Secondary | ICD-10-CM | POA: Diagnosis not present

## 2017-01-21 DIAGNOSIS — I739 Peripheral vascular disease, unspecified: Secondary | ICD-10-CM | POA: Insufficient documentation

## 2017-01-21 DIAGNOSIS — Z01812 Encounter for preprocedural laboratory examination: Secondary | ICD-10-CM | POA: Insufficient documentation

## 2017-01-21 LAB — SURGICAL PCR SCREEN
MRSA, PCR: NEGATIVE
Staphylococcus aureus: NEGATIVE

## 2017-01-21 LAB — CBC WITH DIFFERENTIAL/PLATELET
BASOS ABS: 0.1 10*3/uL (ref 0.0–0.1)
Basophils Relative: 1 %
EOS PCT: 9 %
Eosinophils Absolute: 0.9 10*3/uL — ABNORMAL HIGH (ref 0.0–0.7)
HEMATOCRIT: 38.8 % — AB (ref 39.0–52.0)
Hemoglobin: 12.1 g/dL — ABNORMAL LOW (ref 13.0–17.0)
LYMPHS PCT: 26 %
Lymphs Abs: 2.4 10*3/uL (ref 0.7–4.0)
MCH: 24.2 pg — ABNORMAL LOW (ref 26.0–34.0)
MCHC: 31.2 g/dL (ref 30.0–36.0)
MCV: 77.8 fL — AB (ref 78.0–100.0)
MONO ABS: 0.5 10*3/uL (ref 0.1–1.0)
Monocytes Relative: 5 %
NEUTROS ABS: 5.4 10*3/uL (ref 1.7–7.7)
Neutrophils Relative %: 59 %
PLATELETS: 342 10*3/uL (ref 150–400)
RBC: 4.99 MIL/uL (ref 4.22–5.81)
RDW: 17.6 % — AB (ref 11.5–15.5)
WBC: 9.2 10*3/uL (ref 4.0–10.5)

## 2017-01-21 LAB — COMPREHENSIVE METABOLIC PANEL
ALBUMIN: 4.1 g/dL (ref 3.5–5.0)
ALT: 16 U/L — ABNORMAL LOW (ref 17–63)
AST: 15 U/L (ref 15–41)
Alkaline Phosphatase: 60 U/L (ref 38–126)
Anion gap: 9 (ref 5–15)
BUN: 28 mg/dL — AB (ref 6–20)
CHLORIDE: 104 mmol/L (ref 101–111)
CO2: 26 mmol/L (ref 22–32)
CREATININE: 1.16 mg/dL (ref 0.61–1.24)
Calcium: 9.8 mg/dL (ref 8.9–10.3)
GFR calc Af Amer: >60 mL/min (ref >60)
GFR calc non Af Amer: 59 mL/min — ABNORMAL LOW (ref >60)
GLUCOSE: 113 mg/dL — AB (ref 65–99)
POTASSIUM: 4 mmol/L (ref 3.5–5.1)
Sodium: 139 mmol/L (ref 135–145)
Total Bilirubin: 0.7 mg/dL (ref 0.3–1.2)
Total Protein: 7.2 g/dL (ref 6.5–8.1)

## 2017-01-21 LAB — URINALYSIS, ROUTINE W REFLEX MICROSCOPIC
Bilirubin Urine: NEGATIVE
GLUCOSE, UA: NEGATIVE mg/dL
Hgb urine dipstick: NEGATIVE
KETONES UR: NEGATIVE mg/dL
NITRITE: NEGATIVE
PH: 6 (ref 5.0–8.0)
Protein, ur: NEGATIVE mg/dL
SPECIFIC GRAVITY, URINE: 1.01 (ref 1.005–1.030)

## 2017-01-21 LAB — PROTIME-INR
INR: 1
Prothrombin Time: 13.2 seconds (ref 11.4–15.2)

## 2017-01-21 LAB — URINALYSIS, MICROSCOPIC (REFLEX): RBC / HPF: NONE SEEN RBC/hpf (ref 0–5)

## 2017-01-21 LAB — APTT: APTT: 27 s (ref 24–36)

## 2017-01-21 NOTE — Pre-Procedure Instructions (Signed)
Russell [name]  01/21/2017      Russell [name], Russell [name] [address] Phone: [phone] Fax: [phone]  [facility] [address] [address] Phone: [phone] Fax: [phone]    Your procedure is scheduled on February 7  Report to [place] at [place].M.  Call this number if you have problems the morning of surgery:  [phone]   Remember:  Do not eat food or drink liquids after midnight.   Take these medicines the morning of surgery with A SIP OF WATER albuterol (PROVENTIL HFA;VENTOLIN HFA). Eye drops, gabapentin (NEURONTIN), HYDROcodone-acetaminophen (NORCO if needed, oxybutynin (DITROPAN), tamsulosin (FLOMAX)  7 days prior to surgery STOP taking any Aspirin, Aleve, Naproxen, Ibuprofen, Motrin, Advil, Goody's, BC's, all herbal medications, fish oil, and all vitamins    Do not wear jewelry  Do not wear lotions, powders, or cologne, or deoderant.  Men may shave face and neck.  Do not bring valuables to the hospital.  [facility] is not responsible for any belongings or valuables.  Contacts, dentures or bridgework may not be worn into surgery.  Leave your suitcase in the car.  After surgery it may be brought to your room.  For patients admitted to the hospital, discharge time will be determined by your treatment team.  Patients discharged the day of surgery will not be allowed to drive home.    Special instructions:   Russell [name]- Preparing For Surgery  Before surgery, you can play an important role. Because skin is not sterile, your skin needs to be as free of germs as possible. You can reduce the number of germs on your skin by washing with CHG (chlorahexidine gluconate) Soap before surgery.  CHG is an antiseptic cleaner which kills germs and bonds with the skin to continue killing  germs even after washing.  Please do not use if you have an allergy to CHG or antibacterial soaps. If your skin becomes reddened/irritated stop using the CHG.  Do not shave (including legs and underarms) for at least 48 hours prior to first CHG shower. It is OK to shave your face.  Please follow these instructions carefully.   1. Shower the NIGHT BEFORE SURGERY and the MORNING OF SURGERY with CHG.   2. If you chose to wash your hair, wash your hair first as usual with your normal shampoo.  3. After you shampoo, rinse your hair and body thoroughly to remove the shampoo.  4. Use CHG as you would any other liquid soap. You can apply CHG directly to the skin and wash gently with a scrungie or a clean washcloth.   5. Apply the CHG Soap to your body ONLY FROM THE NECK DOWN.  Do not use on open wounds or open sores. Avoid contact with your eyes, ears, mouth and genitals (private parts). Wash genitals (private parts) with your normal soap.  6. Wash thoroughly, paying special attention to the area where your surgery will be performed.  7. Thoroughly rinse your body with warm water from the neck down.  8. DO NOT shower/wash with your normal soap after using and rinsing off the CHG Soap.  9. Pat yourself dry with a CLEAN TOWEL.   10. Wear CLEAN PAJAMAS   11. Place CLEAN SHEETS on your bed the night of your  first shower and DO NOT SLEEP WITH PETS.    Day of Surgery: Do not apply any deodorants/lotions. Please wear clean clothes to the hospital/surgery center.      Please read over the following fact sheets that you were given.

## 2017-01-21 NOTE — Progress Notes (Signed)
PCP - Kevan Ny Cardiologist - Henriette  Chest x-ray - 01/21/17 EKG - 11/05/16 Stress Test - 12/17/16 ECHO - 12/02/16 Cardiac Cath - denies Sleep Study - denies     Patient denies shortness of breath, fever, cough and chest pain at PAT appointment  Will send to anesthesia for review for cardiac records   Patient verbalized understanding of instructions that was given to them at the PAT appointment. Patient expressed that there were no further questions.  Patient was also instructed that they will need to review over the PAT instructions again at home before the surgery.

## 2017-01-26 NOTE — H&P (Signed)
PREOPERATIVE H&P  Chief Complaint: Bilateral arm weakness  HPI: Russell [name] is a 76 y.o. 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  Patient has failed multiple forms of conservative care and continues to have pain (see office notes for additional details regarding the patient's full course of treatment)  Past Medical History:  Diagnosis Date  . Arthritis    SHOUDLERS, LUMBAR  . BPH with obstruction/lower urinary tract symptoms   . BPH with urinary obstruction 01/15/2016  . COPD (chronic obstructive pulmonary disease) ([place])   . Coronary atherosclerosis    denied knowledge of this 10/27/16  . Depression   . Diverticulosis of colon   . Dyspnea   . ED (erectile dysfunction)   . Full dentures   . GERD (gastroesophageal reflux disease)    not current  . Glaucoma   . History of gastritis   . History of scabies    2008  . 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 History of syncope    03-22-2014  DX  VAGAL RESPONSE  . Hyperlipidemia, mixed   . Hypertension   . Mitral regurgitation   . Peripheral arterial disease ([place])    one vessel runoff bilaterally via peroneal arteries  . Prostate cancer ([place])   . Seizures ([place]) 03/2014   ? or syncope- patient refused to be admitted for further studies- "felt better"  . Type 2 diabetes, diet controlled ([place])    patient and his wife said no- have not been told 01/21/17  . Wears glasses    Past Surgical History:  Procedure Laterality Date  . ANTERIOR CERVICAL DECOMP/DISCECTOMY FUSION  10-16-2009   C4 -- C6  . CAPSULOTOMY Right 01/26/2013   Procedure: MINOR CAPSULOTOMY;  Surgeon: Myrtha Mantis., MD;  Location: [facility];  Service: Ophthalmology;  Laterality: Right;  . CARPECTOMY Right 03/13/2014   Procedure: RIGHT  PROXIMAL ROW CARPECTOMY;   Surgeon: Tennis Must, MD;  Location: [facility];  Service: Orthopedics;  Laterality: Right;  . CARPECTOMY Left 10/22/2015   Procedure: LEFT WRIST PROXIMAL ROW CARPECTOMY ;  Surgeon: Leanora Cover, MD;  Location: [facility];  Service: Orthopedics;  Laterality: Left;  . CARPOMETACARPAL ([place]) FUSION OF THUMB Right 03/13/2014   Procedure: RIGHT FUSION OF THUMB CARPOMETACARPAL [facility]) JOINT;  Surgeon: Tennis Must, MD;  Location: [facility];  Service: Orthopedics;  Laterality: Right;  . CATARACT EXTRACTION W/ INTRAOCULAR LENS  IMPLANT, BILATERAL  2009  . COLONOSCOPY  08-31-2007  . COLONOSCOPY    . CRYOABLATION N/A 01/14/2015   Procedure: CRYO ABLATION PROSTATE;  Surgeon: Ailene Rud, MD;  Location: [facility];  Service: Urology;  Laterality: N/A;  . ESOPHAGOGASTRODUODENOSCOPY  last one 09-11-2008  . EXCISION MASS LEFT FACE , SUBORBITAL AREA, PLASTER RECONSTRUCTION  02-09-2011  . EXCISIONAL DEBRIDEMENT AND REPAIR RIGHT QUADRICEP TENDON  07-05-2000  . FOOT SURGERY Left   . I&D EXTREMITY Right 05/24/2013   Procedure: RIGHT INDEX WOUND DEBRIDEMENT AND CLOSURE;  Surgeon: Jolyn Nap, MD;  Location: [facility];  Service: Orthopedics;  Laterality: Right;  . INSERTION OF SUPRAPUBIC CATHETER N/A 01/14/2015   Procedure: SUPRAPUBIC TUBE PLACEMENT;  Surgeon: Ailene Rud, MD;  [facility];  Service: Urology;  Laterality: N/A;  . KNEE ARTHROSCOPY Right 2008  . LARYNGOSCOPY AND ESOPHAGOSCOPY  06-13-2010   MARSUPIALIZATION LEFT LARGE VALLECULAR CYST  . MASS EXCISION Left 10/22/2015   Procedure: EXCISION MASS OF LEFT INDEX FINGER;  Surgeon: Leanora Cover, MD;  Location: [facility];  Service: Orthopedics;  Laterality: Left;  . ORCHIECTOMY Left 02/24/2015   Procedure: SCROTAL EXPLORATION WITH LEFT ORCHIECTOMY;  Surgeon: Kathie Rhodes, MD;  Location: [facility];  Service: Urology;  Laterality:  Left;  . SHOULDER ARTHROSCOPY/ DEBRIDEMENT LABRAL TEAR/  BICEPS TENOTOMY Left 09-24-2011  . TONSILLECTOMY  as child  . TRANSTHORACIC ECHOCARDIOGRAM  02-21-2010   normal LVF/  ef 55-60%/ mild to moderate MR/  moderate LAE/  mild TR  . YAG LASER APPLICATION Right [id number]   Procedure: YAG LASER APPLICATION;  Surgeon: Myrtha Mantis., MD;  Location: [facility];  Service: Ophthalmology;  Laterality: Right;  . YAG LASER CAPSULOTOMY, LEFT EYE  01-08-2011   Social History   Social History  . Marital status: Married    Spouse name: N/A  . Number of children: N/A  . Years of education: N/A   Occupational History  . Retired - poured concrete    Social History Main Topics  . Smoking status: Former Smoker    Packs/day: 1.00    Years: 59.00    Types: Cigarettes    Quit date: 09/20/2016  . Smokeless tobacco: Never Used     Comment: Started Chantix 10/26/16  . Alcohol use No     Comment: none since approx 2014- heavy before  . Drug use: No  . Sexual activity: Not on file   Other Topics Concern  . Not on file   Social History Narrative   Lives with wife.   Family History  Problem Relation Age of Onset  . Unexplained death Mother     died at a young age, no known cause  . Heart attack Father     age unknown  . Diabetes    . Cancer     No Known Allergies Prior to Admission medications   Medication Sig Start Date End Date Taking? Authorizing Provider  albuterol (PROVENTIL HFA;VENTOLIN HFA) 108 (90 BASE) MCG/ACT inhaler Inhale 2 puffs into the lungs every 6 (six) hours as needed for wheezing or shortness of breath. 04/10/13  Yes Nishant Dhungel, MD  AZOPT 1 % ophthalmic suspension Place 1 drop into the left eye 3 (three) times daily.  12/25/16  Yes Historical Provider, MD  bimatoprost (LUMIGAN) 0.01 % SOLN Place 1 drop into both eyes at bedtime. 04/10/13  Yes Nishant Dhungel, MD  brimonidine-timolol (COMBIGAN) 0.2-0.5 % ophthalmic solution Place 1 drop into both eyes 2 (two) times  daily.    Yes Historical Provider, MD  gabapentin (NEURONTIN) 300 MG capsule Take 1 capsule by mouth 2 (two) times daily. 10/19/16  Yes Historical Provider, MD  HYDROcodone-acetaminophen (NORCO) 7.5-325 MG tablet Take 1 tablet by mouth every 8 (eight) hours as needed for moderate pain. pain 12/02/15  Yes Historical Provider, MD  losartan (COZAAR) 100 MG tablet Take 1 tablet by mouth daily.  10/19/16  Yes Historical Provider, MD  Multiple Vitamins-Minerals (CENTRUM SILVER ADULT 50+ PO) Take 1 tablet by mouth daily.   Yes Historical Provider, MD  Omega-3 Fatty Acids (FISH OIL) 1000 MG CAPS Take 1 capsule by mouth every morning.    Yes Historical Provider, MD  oxybutynin (DITROPAN) 5 MG tablet TAKE 1 TABLET EVERY 12 HOURS  Yes Historical Provider, MD  pravastatin (PRAVACHOL) 80 MG tablet Take 1 tablet by mouth every evening. 10/19/16  Yes Historical Provider, MD  tamsulosin (FLOMAX) 0.4 MG CAPS capsule Take 0.4 mg by mouth at bedtime.  12/03/16  Yes Historical Provider, MD     All other systems have been reviewed and were otherwise negative with the exception of those mentioned in the HPI and as above.  Physical Exam: There were no vitals filed for this visit.  General: Alert, no acute distress Cardiovascular: No pedal edema Respiratory: No cyanosis, no use of accessory musculature Skin: No lesions in the area of chief complaint Neurologic: Sensation intact distally Psychiatric: Patient is competent for consent with normal mood and affect Lymphatic: No axillary or cervical lymphadenopathy  MUSCULOSKELETAL: + hoffman's bilaterally  Assessment/Plan: Cervical myelopathy  Plan for Procedure(s): ANTERIOR CERVICAL DECOMPRESSION FUSION CERVICAL 3-4 WITH INSTRUMENTATION AND ALLOGRAFT, to be followed by a POSTERIOR CERVICAL DECOMPRESSION AND FUSION C3-C7 on the following day   Sinclair Ship, MD 01/26/2017 2:13 PM

## 2017-01-27 ENCOUNTER — Inpatient Hospital Stay (HOSPITAL_COMMUNITY): Payer: Medicare Other

## 2017-01-27 ENCOUNTER — Inpatient Hospital Stay (HOSPITAL_COMMUNITY): Admission: RE | Admit: 2017-01-27 | Payer: Medicare Other | Source: Ambulatory Visit | Admitting: Orthopedic Surgery

## 2017-01-27 ENCOUNTER — Inpatient Hospital Stay (HOSPITAL_COMMUNITY): Payer: Medicare Other | Admitting: Vascular Surgery

## 2017-01-27 ENCOUNTER — Encounter (HOSPITAL_COMMUNITY): Payer: Self-pay | Admitting: Certified Registered Nurse Anesthetist

## 2017-01-27 ENCOUNTER — Inpatient Hospital Stay (HOSPITAL_COMMUNITY)
Admission: RE | Admit: 2017-01-27 | Discharge: 2017-01-31 | DRG: 454 | Disposition: A | Discharge status: Home / Self Care | Payer: Medicare Other | Source: Ambulatory Visit | Attending: Orthopedic Surgery | Admitting: Orthopedic Surgery

## 2017-01-27 ENCOUNTER — Inpatient Hospital Stay (HOSPITAL_COMMUNITY): Payer: Medicare Other | Admitting: Certified Registered Nurse Anesthetist

## 2017-01-27 ENCOUNTER — Encounter (HOSPITAL_COMMUNITY)
Admission: RE | Disposition: A | Discharge status: Home / Self Care | Payer: Self-pay | Source: Ambulatory Visit | Attending: Orthopedic Surgery

## 2017-01-27 DIAGNOSIS — H409 Unspecified glaucoma: Secondary | ICD-10-CM | POA: Diagnosis present

## 2017-01-27 DIAGNOSIS — Z419 Encounter for procedure for purposes other than remedying health state, unspecified: Secondary | ICD-10-CM | POA: Diagnosis not present

## 2017-01-27 DIAGNOSIS — E782 Mixed hyperlipidemia: Secondary | ICD-10-CM | POA: Diagnosis present

## 2017-01-27 DIAGNOSIS — Z87891 Personal history of nicotine dependence: Secondary | ICD-10-CM

## 2017-01-27 DIAGNOSIS — F329 Major depressive disorder, single episode, unspecified: Secondary | ICD-10-CM | POA: Diagnosis present

## 2017-01-27 DIAGNOSIS — Z961 Presence of intraocular lens: Secondary | ICD-10-CM | POA: Diagnosis present

## 2017-01-27 DIAGNOSIS — I10 Essential (primary) hypertension: Secondary | ICD-10-CM | POA: Diagnosis present

## 2017-01-27 DIAGNOSIS — E1165 Type 2 diabetes mellitus with hyperglycemia: Secondary | ICD-10-CM | POA: Diagnosis present

## 2017-01-27 DIAGNOSIS — R0689 Other abnormalities of breathing: Secondary | ICD-10-CM

## 2017-01-27 DIAGNOSIS — K219 Gastro-esophageal reflux disease without esophagitis: Secondary | ICD-10-CM | POA: Diagnosis present

## 2017-01-27 DIAGNOSIS — G629 Polyneuropathy, unspecified: Secondary | ICD-10-CM | POA: Diagnosis present

## 2017-01-27 DIAGNOSIS — G9520 Unspecified cord compression: Secondary | ICD-10-CM | POA: Diagnosis present

## 2017-01-27 DIAGNOSIS — R569 Unspecified convulsions: Secondary | ICD-10-CM | POA: Diagnosis present

## 2017-01-27 DIAGNOSIS — Z8546 Personal history of malignant neoplasm of prostate: Secondary | ICD-10-CM | POA: Diagnosis not present

## 2017-01-27 DIAGNOSIS — I2510 Atherosclerotic heart disease of native coronary artery without angina pectoris: Secondary | ICD-10-CM | POA: Diagnosis present

## 2017-01-27 DIAGNOSIS — G9589 Other specified diseases of spinal cord: Secondary | ICD-10-CM | POA: Diagnosis present

## 2017-01-27 DIAGNOSIS — J449 Chronic obstructive pulmonary disease, unspecified: Secondary | ICD-10-CM | POA: Diagnosis present

## 2017-01-27 DIAGNOSIS — Z79899 Other long term (current) drug therapy: Secondary | ICD-10-CM

## 2017-01-27 DIAGNOSIS — M5412 Radiculopathy, cervical region: Secondary | ICD-10-CM

## 2017-01-27 DIAGNOSIS — Z9841 Cataract extraction status, right eye: Secondary | ICD-10-CM

## 2017-01-27 DIAGNOSIS — M1990 Unspecified osteoarthritis, unspecified site: Secondary | ICD-10-CM | POA: Diagnosis present

## 2017-01-27 DIAGNOSIS — M5410 Radiculopathy, site unspecified: Secondary | ICD-10-CM | POA: Diagnosis present

## 2017-01-27 DIAGNOSIS — Z9842 Cataract extraction status, left eye: Secondary | ICD-10-CM | POA: Diagnosis not present

## 2017-01-27 DIAGNOSIS — M5001 Cervical disc disorder with myelopathy,  high cervical region: Secondary | ICD-10-CM | POA: Diagnosis present

## 2017-01-27 DIAGNOSIS — N401 Enlarged prostate with lower urinary tract symptoms: Secondary | ICD-10-CM | POA: Diagnosis present

## 2017-01-27 HISTORY — DX: Radiculopathy, cervical region: M54.12

## 2017-01-27 HISTORY — PX: ANTERIOR CERVICAL DECOMP/DISCECTOMY FUSION: SHX1161

## 2017-01-27 SURGERY — ANTERIOR CERVICAL DECOMPRESSION/DISCECTOMY FUSION 1 LEVEL
Anesthesia: General | Site: Spine Cervical

## 2017-01-27 MED ORDER — HYDROMORPHONE HCL 1 MG/ML IJ SOLN
0.2500 mg | INTRAMUSCULAR | Status: DC | PRN
  Administered 2017-01-27 (×3): 0.5 mg via INTRAVENOUS

## 2017-01-27 MED ORDER — GABAPENTIN 300 MG PO CAPS
300.0000 mg | ORAL_CAPSULE | Freq: Two times a day (BID) | ORAL | Status: DC
  Administered 2017-01-27 – 2017-01-31 (×6): 300 mg via ORAL
  Filled 2017-01-27 (×6): qty 1

## 2017-01-27 MED ORDER — MENTHOL 3 MG MT LOZG: 1.0000 | LOZENGE | OROMUCOSAL | Status: DC | PRN

## 2017-01-27 MED ORDER — EPINEPHRINE PF 1 MG/ML IJ SOLN
INTRAMUSCULAR | Status: AC
  Filled 2017-01-27: qty 1

## 2017-01-27 MED ORDER — POTASSIUM CHLORIDE IN NACL 20-0.9 MEQ/L-% IV SOLN
INTRAVENOUS | Status: DC
  Administered 2017-01-27: 16:00:00 via INTRAVENOUS
  Filled 2017-01-27: qty 1000

## 2017-01-27 MED ORDER — HEPARIN SODIUM (PORCINE) 1000 UNIT/ML IJ SOLN
INTRAMUSCULAR | Status: AC
  Filled 2017-01-27: qty 1

## 2017-01-27 MED ORDER — SENNOSIDES-DOCUSATE SODIUM 8.6-50 MG PO TABS: 1.0000 | ORAL_TABLET | Freq: Every evening | ORAL | Status: DC | PRN

## 2017-01-27 MED ORDER — ACETAMINOPHEN 325 MG PO TABS
650.0000 mg | ORAL_TABLET | ORAL | Status: DC | PRN
  Administered 2017-01-29 (×2): 650 mg via ORAL
  Filled 2017-01-27 (×2): qty 2

## 2017-01-27 MED ORDER — PHENOL 1.4 % MT LIQD
1.0000 | OROMUCOSAL | Status: DC | PRN
  Filled 2017-01-27: qty 177

## 2017-01-27 MED ORDER — PROPOFOL 10 MG/ML IV BOLUS
INTRAVENOUS | Status: AC
  Filled 2017-01-27: qty 40

## 2017-01-27 MED ORDER — SCOPOLAMINE 1 MG/3DAYS TD PT72
1.0000 | MEDICATED_PATCH | TRANSDERMAL | Status: DC
  Administered 2017-01-27: 1.5 mg via TRANSDERMAL
  Filled 2017-01-27: qty 1

## 2017-01-27 MED ORDER — 0.9 % SODIUM CHLORIDE (POUR BTL) OPTIME
TOPICAL | Status: DC | PRN
  Administered 2017-01-27: 1000 mL

## 2017-01-27 MED ORDER — ALUM & MAG HYDROXIDE-SIMETH 200-200-20 MG/5ML PO SUSP
30.0000 mL | Freq: Four times a day (QID) | ORAL | Status: DC | PRN
Start: 2017-01-27 — End: 2017-01-31
  Administered 2017-01-31: 30 mL via ORAL
  Filled 2017-01-27: qty 30

## 2017-01-27 MED ORDER — ACETAMINOPHEN 650 MG RE SUPP: 650.0000 mg | RECTAL | Status: DC | PRN

## 2017-01-27 MED ORDER — PROMETHAZINE HCL 25 MG/ML IJ SOLN
6.2500 mg | INTRAMUSCULAR | Status: DC | PRN
Start: 2017-01-27 — End: 2017-01-27

## 2017-01-27 MED ORDER — MORPHINE SULFATE (PF) 2 MG/ML IV SOLN
1.0000 mg | INTRAVENOUS | Status: DC | PRN
  Administered 2017-01-27: 2 mg via INTRAVENOUS
  Administered 2017-01-27 – 2017-01-28 (×2): 4 mg via INTRAVENOUS
  Filled 2017-01-27: qty 2
  Filled 2017-01-27: qty 1
  Filled 2017-01-27: qty 2

## 2017-01-27 MED ORDER — LIDOCAINE HCL (CARDIAC) 20 MG/ML IV SOLN
INTRAVENOUS | Status: DC | PRN
  Administered 2017-01-27: 100 mg via INTRAVENOUS

## 2017-01-27 MED ORDER — MINERAL OIL LIGHT 100 % EX OIL
TOPICAL_OIL | CUTANEOUS | Status: AC
  Filled 2017-01-27: qty 25

## 2017-01-27 MED ORDER — ONDANSETRON HCL 4 MG/2ML IJ SOLN
INTRAMUSCULAR | Status: AC
  Filled 2017-01-27: qty 6

## 2017-01-27 MED ORDER — SODIUM CHLORIDE 0.9% FLUSH: 3.0000 mL | INTRAVENOUS | Status: DC | PRN

## 2017-01-27 MED ORDER — ONDANSETRON HCL 4 MG/2ML IJ SOLN
INTRAMUSCULAR | Status: DC | PRN
  Administered 2017-01-27: 4 mg via INTRAVENOUS

## 2017-01-27 MED ORDER — ALBUTEROL SULFATE (2.5 MG/3ML) 0.083% IN NEBU
INHALATION_SOLUTION | RESPIRATORY_TRACT | Status: AC
  Filled 2017-01-27: qty 3

## 2017-01-27 MED ORDER — BUPIVACAINE-EPINEPHRINE 0.25% -1:200000 IJ SOLN
INTRAMUSCULAR | Status: DC | PRN
  Administered 2017-01-27: 3 mL

## 2017-01-27 MED ORDER — SODIUM CHLORIDE 0.9 % IV SOLN
250.0000 mL | INTRAVENOUS | Status: DC
Start: 2017-01-27 — End: 2017-01-28

## 2017-01-27 MED ORDER — FENTANYL CITRATE (PF) 100 MCG/2ML IJ SOLN
INTRAMUSCULAR | Status: DC | PRN
  Administered 2017-01-27 (×2): 50 ug via INTRAVENOUS
  Administered 2017-01-27: 100 ug via INTRAVENOUS

## 2017-01-27 MED ORDER — THROMBIN 20000 UNITS EX SOLR
CUTANEOUS | Status: AC
  Filled 2017-01-27: qty 20000

## 2017-01-27 MED ORDER — FLEET ENEMA 7-19 GM/118ML RE ENEM
1.0000 | ENEMA | Freq: Once | RECTAL | Status: DC | PRN
  Filled 2017-01-27: qty 1

## 2017-01-27 MED ORDER — LOSARTAN POTASSIUM 50 MG PO TABS
100.0000 mg | ORAL_TABLET | Freq: Every day | ORAL | Status: DC
  Administered 2017-01-29 – 2017-01-31 (×3): 100 mg via ORAL
  Filled 2017-01-27 (×3): qty 2

## 2017-01-27 MED ORDER — LACTATED RINGERS IV SOLN
INTRAVENOUS | Status: DC | PRN
  Administered 2017-01-27 (×2): via INTRAVENOUS

## 2017-01-27 MED ORDER — LATANOPROST 0.005 % OP SOLN
1.0000 [drp] | Freq: Every day | OPHTHALMIC | Status: DC
  Administered 2017-01-27 – 2017-01-30 (×4): 1 [drp] via OPHTHALMIC
  Filled 2017-01-27 (×2): qty 2.5

## 2017-01-27 MED ORDER — DIAZEPAM 5 MG PO TABS
5.0000 mg | ORAL_TABLET | Freq: Four times a day (QID) | ORAL | Status: DC | PRN
  Administered 2017-01-29 – 2017-01-31 (×4): 5 mg via ORAL
  Filled 2017-01-27 (×4): qty 1

## 2017-01-27 MED ORDER — EPHEDRINE SULFATE 50 MG/ML IJ SOLN
INTRAMUSCULAR | Status: DC | PRN
  Administered 2017-01-27 (×5): 5 mg via INTRAVENOUS
  Administered 2017-01-27: 10 mg via INTRAVENOUS
  Administered 2017-01-27: 5 mg via INTRAVENOUS

## 2017-01-27 MED ORDER — BRIMONIDINE TARTRATE 0.2 % OP SOLN
1.0000 [drp] | Freq: Two times a day (BID) | OPHTHALMIC | Status: DC
  Administered 2017-01-27 – 2017-01-31 (×7): 1 [drp] via OPHTHALMIC
  Filled 2017-01-27 (×2): qty 5

## 2017-01-27 MED ORDER — BUPIVACAINE HCL (PF) 0.25 % IJ SOLN
INTRAMUSCULAR | Status: AC
  Filled 2017-01-27: qty 30

## 2017-01-27 MED ORDER — HYDROMORPHONE HCL 2 MG/ML IJ SOLN
INTRAMUSCULAR | Status: AC
  Filled 2017-01-27: qty 1

## 2017-01-27 MED ORDER — BRINZOLAMIDE 1 % OP SUSP
1.0000 [drp] | Freq: Three times a day (TID) | OPHTHALMIC | Status: DC
  Administered 2017-01-27 – 2017-01-31 (×10): 1 [drp] via OPHTHALMIC
  Filled 2017-01-27 (×2): qty 10

## 2017-01-27 MED ORDER — TIMOLOL MALEATE 0.5 % OP SOLN
1.0000 [drp] | Freq: Two times a day (BID) | OPHTHALMIC | Status: DC
  Administered 2017-01-27 – 2017-01-31 (×7): 1 [drp] via OPHTHALMIC
  Filled 2017-01-27 (×2): qty 5

## 2017-01-27 MED ORDER — PRAVASTATIN SODIUM 40 MG PO TABS
80.0000 mg | ORAL_TABLET | Freq: Every evening | ORAL | Status: DC
  Administered 2017-01-29 – 2017-01-30 (×2): 80 mg via ORAL
  Filled 2017-01-27 (×3): qty 2

## 2017-01-27 MED ORDER — ALBUTEROL SULFATE (2.5 MG/3ML) 0.083% IN NEBU: 2.5000 mg | INHALATION_SOLUTION | Freq: Four times a day (QID) | RESPIRATORY_TRACT | Status: DC | PRN

## 2017-01-27 MED ORDER — ALBUMIN HUMAN 5 % IV SOLN
INTRAVENOUS | Status: DC | PRN
  Administered 2017-01-27 (×2): via INTRAVENOUS

## 2017-01-27 MED ORDER — DEXTROSE 5 % IV SOLN
INTRAVENOUS | Status: DC | PRN
  Administered 2017-01-27: 11:00:00 via INTRAVENOUS
  Administered 2017-01-27: 25 ug/min via INTRAVENOUS

## 2017-01-27 MED ORDER — BRIMONIDINE TARTRATE-TIMOLOL 0.2-0.5 % OP SOLN
1.0000 [drp] | Freq: Two times a day (BID) | OPHTHALMIC | Status: DC
  Filled 2017-01-27: qty 5

## 2017-01-27 MED ORDER — BISACODYL 5 MG PO TBEC
5.0000 mg | DELAYED_RELEASE_TABLET | Freq: Every day | ORAL | Status: DC | PRN
  Administered 2017-01-31: 5 mg via ORAL
  Filled 2017-01-27: qty 1

## 2017-01-27 MED ORDER — ALBUTEROL SULFATE HFA 108 (90 BASE) MCG/ACT IN AERS: 2.0000 | INHALATION_SPRAY | Freq: Four times a day (QID) | RESPIRATORY_TRACT | Status: DC | PRN

## 2017-01-27 MED ORDER — FENTANYL CITRATE (PF) 100 MCG/2ML IJ SOLN
INTRAMUSCULAR | Status: AC
Start: 2017-01-27 — End: 2017-01-27
  Filled 2017-01-27: qty 2

## 2017-01-27 MED ORDER — DOCUSATE SODIUM 100 MG PO CAPS
100.0000 mg | ORAL_CAPSULE | Freq: Two times a day (BID) | ORAL | Status: DC
  Administered 2017-01-27 – 2017-01-31 (×6): 100 mg via ORAL
  Filled 2017-01-27 (×7): qty 1

## 2017-01-27 MED ORDER — ZOLPIDEM TARTRATE 5 MG PO TABS: 5.0000 mg | ORAL_TABLET | Freq: Every evening | ORAL | Status: DC | PRN

## 2017-01-27 MED ORDER — SUGAMMADEX SODIUM 200 MG/2ML IV SOLN
INTRAVENOUS | Status: DC | PRN
  Administered 2017-01-27: 157 mg via INTRAVENOUS

## 2017-01-27 MED ORDER — ROCURONIUM BROMIDE 50 MG/5ML IV SOSY
PREFILLED_SYRINGE | INTRAVENOUS | Status: AC
  Filled 2017-01-27: qty 15

## 2017-01-27 MED ORDER — CEFAZOLIN SODIUM-DEXTROSE 2-4 GM/100ML-% IV SOLN: 2.0000 g | INTRAVENOUS | Status: DC

## 2017-01-27 MED ORDER — ADULT MULTIVITAMIN W/MINERALS CH
1.0000 | ORAL_TABLET | Freq: Every day | ORAL | Status: DC
  Administered 2017-01-29 – 2017-01-31 (×3): 1 via ORAL
  Filled 2017-01-27 (×3): qty 1

## 2017-01-27 MED ORDER — OXYBUTYNIN CHLORIDE 5 MG PO TABS
5.0000 mg | ORAL_TABLET | Freq: Two times a day (BID) | ORAL | Status: DC
  Administered 2017-01-27 – 2017-01-31 (×7): 5 mg via ORAL
  Filled 2017-01-27 (×9): qty 1

## 2017-01-27 MED ORDER — CEFAZOLIN SODIUM-DEXTROSE 2-4 GM/100ML-% IV SOLN
2.0000 g | Freq: Three times a day (TID) | INTRAVENOUS | Status: AC
  Administered 2017-01-27 (×2): 2 g via INTRAVENOUS
  Filled 2017-01-27 (×2): qty 100

## 2017-01-27 MED ORDER — SUCCINYLCHOLINE CHLORIDE 200 MG/10ML IV SOSY
PREFILLED_SYRINGE | INTRAVENOUS | Status: AC
  Filled 2017-01-27: qty 10

## 2017-01-27 MED ORDER — ROCURONIUM BROMIDE 100 MG/10ML IV SOLN
INTRAVENOUS | Status: DC | PRN
  Administered 2017-01-27 (×2): 10 mg via INTRAVENOUS
  Administered 2017-01-27: 50 mg via INTRAVENOUS

## 2017-01-27 MED ORDER — CENTRUM SILVER ADULT 50+ PO TABS: ORAL_TABLET | Freq: Every day | ORAL | Status: DC

## 2017-01-27 MED ORDER — OXYCODONE-ACETAMINOPHEN 5-325 MG PO TABS
1.0000 | ORAL_TABLET | ORAL | Status: DC | PRN
  Administered 2017-01-27: 2 via ORAL
  Filled 2017-01-27 (×2): qty 2

## 2017-01-27 MED ORDER — ONDANSETRON HCL 4 MG/2ML IJ SOLN
4.0000 mg | INTRAMUSCULAR | Status: DC | PRN
  Administered 2017-01-28: 4 mg via INTRAVENOUS

## 2017-01-27 MED ORDER — NEOSTIGMINE METHYLSULFATE 5 MG/5ML IV SOSY
PREFILLED_SYRINGE | INTRAVENOUS | Status: AC
  Filled 2017-01-27: qty 5

## 2017-01-27 MED ORDER — FENTANYL CITRATE (PF) 100 MCG/2ML IJ SOLN
INTRAMUSCULAR | Status: AC
  Filled 2017-01-27: qty 2

## 2017-01-27 MED ORDER — POVIDONE-IODINE 7.5 % EX SOLN: Freq: Once | CUTANEOUS | Status: DC

## 2017-01-27 MED ORDER — SODIUM CHLORIDE 0.9% FLUSH
3.0000 mL | Freq: Two times a day (BID) | INTRAVENOUS | Status: DC
  Administered 2017-01-27 – 2017-01-29 (×3): 3 mL via INTRAVENOUS

## 2017-01-27 MED ORDER — ALBUTEROL SULFATE HFA 108 (90 BASE) MCG/ACT IN AERS
INHALATION_SPRAY | RESPIRATORY_TRACT | Status: DC | PRN
  Administered 2017-01-27 (×2): 2 via RESPIRATORY_TRACT

## 2017-01-27 MED ORDER — THROMBIN 20000 UNITS EX KIT
PACK | CUTANEOUS | Status: DC | PRN
  Administered 2017-01-27: 20000 [IU] via TOPICAL

## 2017-01-27 MED ORDER — MIDAZOLAM HCL 2 MG/2ML IJ SOLN
INTRAMUSCULAR | Status: AC
  Filled 2017-01-27: qty 2

## 2017-01-27 MED ORDER — TAMSULOSIN HCL 0.4 MG PO CAPS
0.4000 mg | ORAL_CAPSULE | Freq: Every day | ORAL | Status: DC
  Administered 2017-01-27 – 2017-01-30 (×3): 0.4 mg via ORAL
  Filled 2017-01-27 (×3): qty 1

## 2017-01-27 MED ORDER — EPHEDRINE 5 MG/ML INJ
INTRAVENOUS | Status: AC
  Filled 2017-01-27: qty 20

## 2017-01-27 MED ORDER — LIDOCAINE 2% (20 MG/ML) 5 ML SYRINGE
INTRAMUSCULAR | Status: AC
  Filled 2017-01-27: qty 25

## 2017-01-27 MED ORDER — CEFAZOLIN SODIUM-DEXTROSE 2-4 GM/100ML-% IV SOLN
2.0000 g | INTRAVENOUS | Status: AC
  Administered 2017-01-27: 2 g via INTRAVENOUS
  Filled 2017-01-27: qty 100

## 2017-01-27 MED ORDER — PROPOFOL 10 MG/ML IV BOLUS
INTRAVENOUS | Status: DC | PRN
  Administered 2017-01-27: 100 mg via INTRAVENOUS
  Administered 2017-01-27: 30 mg via INTRAVENOUS
  Administered 2017-01-27: 40 mg via INTRAVENOUS

## 2017-01-27 MED ORDER — PHENYLEPHRINE 40 MCG/ML (10ML) SYRINGE FOR IV PUSH (FOR BLOOD PRESSURE SUPPORT)
PREFILLED_SYRINGE | INTRAVENOUS | Status: AC
  Filled 2017-01-27: qty 20

## 2017-01-27 SURGICAL SUPPLY — 76 items
APL SKNCLS STERI-STRIP NONHPOA (GAUZE/BANDAGES/DRESSINGS) ×1
BENZOIN TINCTURE PRP APPL 2/3 (GAUZE/BANDAGES/DRESSINGS) ×3 IMPLANT
BIT DRILL 16 (BIT) ×1 IMPLANT
BIT DRILL 16MM (BIT) ×1
BIT DRILL NEURO 2X3.1 SFT TUCH (MISCELLANEOUS) ×1 IMPLANT
BLADE SURG 15 STRL LF DISP TIS (BLADE) ×1 IMPLANT
BLADE SURG 15 STRL SS (BLADE) ×3
BLADE SURG ROTATE 9660 (MISCELLANEOUS) ×3 IMPLANT
BONE VIVIGEN FORMABLE 1.3CC (Bone Implant) ×3 IMPLANT
BUR MATCHSTICK NEURO 3.0 LAGG (BURR) IMPLANT
CARTRIDGE OIL MAESTRO DRILL (MISCELLANEOUS) ×1 IMPLANT
CLOSURE STERI-STRIP 1/2X4 (GAUZE/BANDAGES/DRESSINGS) ×1
CLOSURE WOUND 1/2 X4 (GAUZE/BANDAGES/DRESSINGS) ×1
CLSR STERI-STRIP ANTIMIC 1/2X4 (GAUZE/BANDAGES/DRESSINGS) ×1 IMPLANT
CORDS BIPOLAR (ELECTRODE) ×3 IMPLANT
COVER SURGICAL LIGHT HANDLE (MISCELLANEOUS) ×3 IMPLANT
CRADLE DONUT ADULT HEAD (MISCELLANEOUS) ×3 IMPLANT
DIFFUSER DRILL AIR PNEUMATIC (MISCELLANEOUS) ×3 IMPLANT
DRAIN JACKSON RD 7FR 3/32 (WOUND CARE) IMPLANT
DRAPE C-ARM 42X72 X-RAY (DRAPES) ×3 IMPLANT
DRAPE POUCH INSTRU U-SHP 10X18 (DRAPES) ×3 IMPLANT
DRAPE SURG 17X23 STRL (DRAPES) ×9 IMPLANT
DRILL NEURO 2X3.1 SOFT TOUCH (MISCELLANEOUS) ×3
DURAPREP 26ML APPLICATOR (WOUND CARE) ×3 IMPLANT
ELECT COATED BLADE 2.86 ST (ELECTRODE) ×3 IMPLANT
ELECT REM PT RETURN 9FT ADLT (ELECTROSURGICAL) ×3
ELECTRODE REM PT RTRN 9FT ADLT (ELECTROSURGICAL) ×1 IMPLANT
EVACUATOR SILICONE 100CC (DRAIN) IMPLANT
GAUZE SPONGE 4X4 12PLY STRL (GAUZE/BANDAGES/DRESSINGS) ×3 IMPLANT
GAUZE SPONGE 4X4 16PLY XRAY LF (GAUZE/BANDAGES/DRESSINGS) ×3 IMPLANT
GLOVE BIO SURGEON STRL SZ7 (GLOVE) ×3 IMPLANT
GLOVE BIO SURGEON STRL SZ8 (GLOVE) ×3 IMPLANT
GLOVE BIOGEL PI IND STRL 7.0 (GLOVE) ×2 IMPLANT
GLOVE BIOGEL PI IND STRL 8 (GLOVE) ×1 IMPLANT
GLOVE BIOGEL PI INDICATOR 7.0 (GLOVE) ×4
GLOVE BIOGEL PI INDICATOR 8 (GLOVE) ×2
GOWN STRL REUS W/ TWL LRG LVL3 (GOWN DISPOSABLE) ×1 IMPLANT
GOWN STRL REUS W/ TWL XL LVL3 (GOWN DISPOSABLE) ×1 IMPLANT
GOWN STRL REUS W/TWL LRG LVL3 (GOWN DISPOSABLE) ×3
GOWN STRL REUS W/TWL XL LVL3 (GOWN DISPOSABLE) ×3
GRAFT BNE MATRIX VG FRMBL SM 1 (Bone Implant) IMPLANT
IMPLANT ZERO PARALLEL 9MM ×2 IMPLANT
IV CATH 14GX2 1/4 (CATHETERS) ×3 IMPLANT
KIT BASIN OR (CUSTOM PROCEDURE TRAY) ×3 IMPLANT
KIT ROOM TURNOVER OR (KITS) ×3 IMPLANT
MANIFOLD NEPTUNE II (INSTRUMENTS) ×3 IMPLANT
NDL PRECISIONGLIDE 27X1.5 (NEEDLE) ×1 IMPLANT
NDL SPNL 20GX3.5 QUINCKE YW (NEEDLE) ×1 IMPLANT
NEEDLE PRECISIONGLIDE 27X1.5 (NEEDLE) ×3 IMPLANT
NEEDLE SPNL 20GX3.5 QUINCKE YW (NEEDLE) ×3 IMPLANT
NS IRRIG 1000ML POUR BTL (IV SOLUTION) ×3 IMPLANT
OIL CARTRIDGE MAESTRO DRILL (MISCELLANEOUS) ×3
PACK ORTHO CERVICAL (CUSTOM PROCEDURE TRAY) ×3 IMPLANT
PAD ARMBOARD 7.5X6 YLW CONV (MISCELLANEOUS) ×6 IMPLANT
PATTIES SURGICAL .5 X.5 (GAUZE/BANDAGES/DRESSINGS) IMPLANT
PATTIES SURGICAL .5 X1 (DISPOSABLE) IMPLANT
PIN DISTRACTION 14 (PIN) ×4 IMPLANT
SCREW SELF TAP 16MM 0 P VA (Screw) ×4 IMPLANT
SPONGE GAUZE 4X4 12PLY STER LF (GAUZE/BANDAGES/DRESSINGS) ×2 IMPLANT
SPONGE INTESTINAL PEANUT (DISPOSABLE) ×3 IMPLANT
SPONGE SURGIFOAM ABS GEL 100 (HEMOSTASIS) IMPLANT
STRIP CLOSURE SKIN 1/2X4 (GAUZE/BANDAGES/DRESSINGS) ×2 IMPLANT
SURGIFLO W/THROMBIN 8M KIT (HEMOSTASIS) IMPLANT
SUT MNCRL AB 4-0 PS2 18 (SUTURE) IMPLANT
SUT SILK 4 0 (SUTURE)
SUT SILK 4-0 18XBRD TIE 12 (SUTURE) IMPLANT
SUT VIC AB 2-0 CT2 18 VCP726D (SUTURE) ×3 IMPLANT
SYR BULB IRRIGATION 50ML (SYRINGE) ×3 IMPLANT
SYR CONTROL 10ML LL (SYRINGE) ×6 IMPLANT
TAPE CLOTH 4X10 WHT NS (GAUZE/BANDAGES/DRESSINGS) ×3 IMPLANT
TAPE CLOTH SURG 4X10 WHT LF (GAUZE/BANDAGES/DRESSINGS) ×2 IMPLANT
TAPE UMBILICAL COTTON 1/8X30 (MISCELLANEOUS) ×3 IMPLANT
TOWEL OR 17X24 6PK STRL BLUE (TOWEL DISPOSABLE) ×3 IMPLANT
TOWEL OR 17X26 10 PK STRL BLUE (TOWEL DISPOSABLE) ×3 IMPLANT
WATER STERILE IRR 1000ML POUR (IV SOLUTION) ×3 IMPLANT
YANKAUER SUCT BULB TIP NO VENT (SUCTIONS) ×3 IMPLANT

## 2017-01-27 NOTE — Anesthesia Procedure Notes (Signed)
Procedure Name: Intubation Date/Time: 01/27/2017 8:41 AM Performed by: Tressia Miners LEFFEW Pre-anesthesia Checklist: Patient identified, Patient being monitored, Timeout performed, Emergency Drugs available and Suction available Patient Re-evaluated:Patient Re-evaluated prior to inductionOxygen Delivery Method: Circle System Utilized Preoxygenation: Pre-oxygenation with 100% oxygen Intubation Type: IV induction Ventilation: Mask ventilation without difficulty Laryngoscope Size: Glidescope and 4 Grade View: Grade I Tube type: Oral Tube size: 7.5 mm Number of attempts: 1 Airway Equipment and Method: Video-laryngoscopy and Rigid stylet Placement Confirmation: ETT inserted through vocal cords under direct vision,  positive ETCO2 and breath sounds checked- equal and bilateral Secured at: 22 cm Tube secured with: Tape Dental Injury: Teeth and Oropharynx as per pre-operative assessment  Comments: glidescope used per surgeon request. Patient has significant cervical spinal stenosis.

## 2017-01-27 NOTE — Anesthesia Postprocedure Evaluation (Addendum)
Anesthesia Post Note  Patient: Russell [name]  Procedure(s) Performed: Procedure(s) (LRB): ANTERIOR CERVICAL DECOMPRESSION FUSION CERVICAL 3-4 WITH INSTRUMENTATION AND ALLOGRAFT (N/A)  Patient location during evaluation: PACU Anesthesia Type: General Level of consciousness: sedated Pain management: pain level controlled Vital Signs Assessment: post-procedure vital signs reviewed and stable Respiratory status: spontaneous breathing and respiratory function stable Cardiovascular status: stable Anesthetic complications: no       Last Vitals:  Vitals:   01/27/17 0659 01/27/17 1140  BP: (!) 142/79 133/75  Pulse: 63 76  Resp: 18 18  Temp: 36.9 C 36.3 C    Last Pain:  Vitals:   01/27/17 0658  TempSrc:   PainSc: [place]

## 2017-01-27 NOTE — Transfer of Care (Signed)
Immediate Anesthesia Transfer of Care Note  Patient: Russell [name]  Procedure(s) Performed: Procedure(s) with comments: ANTERIOR CERVICAL DECOMPRESSION FUSION CERVICAL 3-4 WITH INSTRUMENTATION AND ALLOGRAFT (N/A) - ANTERIOR CERVICAL DECOMPRESSION FUSION CERVICAL 3-4 WITH INSTRUMENTATION AND ALLOGRAFT  Patient Location: PACU  Anesthesia Type:General  Level of Consciousness: awake, alert  and oriented  Airway & Oxygen Therapy: Patient Spontanous Breathing and Patient connected to face mask oxygen  Post-op Assessment: Report given to RN, Post -op Vital signs reviewed and stable and Patient moving all extremities X 4  Post vital signs: Reviewed and stable  Last Vitals:  Vitals:   01/27/17 0637 01/27/17 0659  BP: (!) 143/74 (!) 142/79  Pulse: 64 63  Resp: 20 18  Temp: 36.4 C 36.9 C    Last Pain:  Vitals:   01/27/17 0658  TempSrc:   PainSc: 9       Patients Stated Pain Goal: 3 ([id number])  Complications: No apparent anesthesia complications

## 2017-01-27 NOTE — Anesthesia Preprocedure Evaluation (Addendum)
Anesthesia Evaluation  Patient identified by MRN, date of birth, ID band Patient awake    Reviewed: Allergy & Precautions, NPO status , Patient's Chart, lab work & pertinent test results  History of Anesthesia Complications Negative for: history of anesthetic complications  Airway Mallampati: I  TM Distance: >3 FB Neck ROM: Full    Dental no notable dental hx. (+) Dental Advisory Given   Pulmonary COPD, Current Smoker, former smoker,    Pulmonary exam normal        Cardiovascular hypertension, Pt. on medications + CAD  PERIPHERAL VASCULAR DISEASE:      Normal cardiovascular exam  Impressions:  - Normal LV size with mild LV hypertrophy. EF 50% with mild   inferolateral and anterolateral hypokinesis. Normal RV size and   systolic function. No significant valvular abnormalities.   Neuro/Psych PSYCHIATRIC DISORDERS Depression    GI/Hepatic GERD  ,  Endo/Other  diabetes, Well Controlled, Type 2  Renal/GU      Musculoskeletal   Abdominal   Peds  Hematology   Anesthesia Other Findings   Reproductive/Obstetrics                            Anesthesia Physical  Anesthesia Plan  ASA: III  Anesthesia Plan: General   Post-op Pain Management:    Induction: Intravenous  Airway Management Planned: Oral ETT  Additional Equipment:   Intra-op Plan:   Post-operative Plan: Extubation in OR  Informed Consent:   Plan Discussed with: Anesthesiologist  Anesthesia Plan Comments:         Anesthesia Quick Evaluation

## 2017-01-28 ENCOUNTER — Encounter (HOSPITAL_COMMUNITY): Payer: Self-pay | Admitting: Anesthesiology

## 2017-01-28 ENCOUNTER — Ambulatory Visit: Payer: Medicare Other | Admitting: Podiatry

## 2017-01-28 ENCOUNTER — Inpatient Hospital Stay (HOSPITAL_COMMUNITY): Payer: Medicare Other | Admitting: Certified Registered Nurse Anesthetist

## 2017-01-28 ENCOUNTER — Inpatient Hospital Stay (HOSPITAL_COMMUNITY): Payer: Medicare Other

## 2017-01-28 ENCOUNTER — Encounter (HOSPITAL_COMMUNITY)
Admission: RE | Disposition: A | Discharge status: Home / Self Care | Payer: Self-pay | Source: Ambulatory Visit | Attending: Orthopedic Surgery

## 2017-01-28 DIAGNOSIS — M5410 Radiculopathy, site unspecified: Secondary | ICD-10-CM

## 2017-01-28 DIAGNOSIS — Z419 Encounter for procedure for purposes other than remedying health state, unspecified: Secondary | ICD-10-CM

## 2017-01-28 DIAGNOSIS — R0689 Other abnormalities of breathing: Secondary | ICD-10-CM

## 2017-01-28 HISTORY — PX: POSTERIOR CERVICAL FUSION/FORAMINOTOMY: SHX5038

## 2017-01-28 LAB — BLOOD GAS, ARTERIAL
Acid-Base Excess: 0.9 mmol/L (ref 0.0–2.0)
Bicarbonate: 25.9 mmol/L (ref 20.0–28.0)
DRAWN BY: 30136
FIO2: 60
LHR: 16 {breaths}/min
MECHVT: 530 mL
O2 Saturation: 99.1 %
PATIENT TEMPERATURE: 97.3
PCO2 ART: 47.4 mmHg (ref 32.0–48.0)
PEEP: 5 cmH2O
PH ART: 7.353 (ref 7.350–7.450)
PO2 ART: 159 mmHg — AB (ref 83.0–108.0)

## 2017-01-28 LAB — GLUCOSE, CAPILLARY
GLUCOSE-CAPILLARY: 110 mg/dL — AB (ref 65–99)
GLUCOSE-CAPILLARY: 113 mg/dL — AB (ref 65–99)
Glucose-Capillary: 126 mg/dL — ABNORMAL HIGH (ref 65–99)
Glucose-Capillary: 153 mg/dL — ABNORMAL HIGH (ref 65–99)

## 2017-01-28 LAB — POCT I-STAT 3, ART BLOOD GAS (G3+)
ACID-BASE EXCESS: 1 mmol/L (ref 0.0–2.0)
Bicarbonate: 27 mmol/L (ref 20.0–28.0)
O2 SAT: 99 %
PO2 ART: 134 mmHg — AB (ref 83.0–108.0)
TCO2: 28 mmol/L (ref 0–100)
pCO2 arterial: 47.2 mmHg (ref 32.0–48.0)
pH, Arterial: 7.364 (ref 7.350–7.450)

## 2017-01-28 LAB — CBC
HCT: 33 % — ABNORMAL LOW (ref 39.0–52.0)
Hemoglobin: 10.3 g/dL — ABNORMAL LOW (ref 13.0–17.0)
MCH: 24.1 pg — ABNORMAL LOW (ref 26.0–34.0)
MCHC: 31.2 g/dL (ref 30.0–36.0)
MCV: 77.3 fL — ABNORMAL LOW (ref 78.0–100.0)
Platelets: 306 10*3/uL (ref 150–400)
RBC: 4.27 MIL/uL (ref 4.22–5.81)
RDW: 17.1 % — ABNORMAL HIGH (ref 11.5–15.5)
WBC: 11 10*3/uL — ABNORMAL HIGH (ref 4.0–10.5)

## 2017-01-28 LAB — PREPARE RBC (CROSSMATCH)

## 2017-01-28 SURGERY — POSTERIOR CERVICAL FUSION/FORAMINOTOMY LEVEL 4
Anesthesia: General

## 2017-01-28 MED ORDER — SUGAMMADEX SODIUM 200 MG/2ML IV SOLN
INTRAVENOUS | Status: DC | PRN
  Administered 2017-01-28: 200 mg via INTRAVENOUS

## 2017-01-28 MED ORDER — FENTANYL CITRATE (PF) 100 MCG/2ML IJ SOLN
50.0000 ug | INTRAMUSCULAR | Status: DC | PRN
  Administered 2017-01-28 – 2017-01-29 (×4): 50 ug via INTRAVENOUS
  Filled 2017-01-28 (×4): qty 2

## 2017-01-28 MED ORDER — FENTANYL CITRATE (PF) 100 MCG/2ML IJ SOLN
INTRAMUSCULAR | Status: AC
  Filled 2017-01-28: qty 2

## 2017-01-28 MED ORDER — BUPIVACAINE LIPOSOME 1.3 % IJ SUSP
20.0000 mL | INTRAMUSCULAR | Status: AC
  Administered 2017-01-28: 20 mL
  Filled 2017-01-28: qty 20

## 2017-01-28 MED ORDER — SUCCINYLCHOLINE CHLORIDE 200 MG/10ML IV SOSY
PREFILLED_SYRINGE | INTRAVENOUS | Status: AC
  Filled 2017-01-28: qty 10

## 2017-01-28 MED ORDER — HYDROMORPHONE HCL 1 MG/ML IJ SOLN
0.2500 mg | INTRAMUSCULAR | Status: DC | PRN
  Administered 2017-01-28 (×3): 0.5 mg via INTRAVENOUS

## 2017-01-28 MED ORDER — BUPIVACAINE-EPINEPHRINE 0.25% -1:200000 IJ SOLN
INTRAMUSCULAR | Status: DC | PRN
  Administered 2017-01-28: 20 mL
  Administered 2017-01-28: 8 mL

## 2017-01-28 MED ORDER — POTASSIUM CHLORIDE IN NACL 20-0.9 MEQ/L-% IV SOLN
INTRAVENOUS | Status: DC
  Administered 2017-01-28: 10 mL/h via INTRAVENOUS
  Filled 2017-01-28: qty 1000

## 2017-01-28 MED ORDER — MIDAZOLAM HCL 2 MG/2ML IJ SOLN
1.0000 mg | INTRAMUSCULAR | Status: DC | PRN
  Administered 2017-01-28: 2 mg via INTRAVENOUS
  Administered 2017-01-28: 1 mg via INTRAVENOUS
  Filled 2017-01-28: qty 2

## 2017-01-28 MED ORDER — PROMETHAZINE HCL 25 MG/ML IJ SOLN
6.2500 mg | INTRAMUSCULAR | Status: DC | PRN
  Administered 2017-01-28: 12.5 mg via INTRAVENOUS

## 2017-01-28 MED ORDER — BACITRACIN ZINC 500 UNIT/GM EX OINT
TOPICAL_OINTMENT | CUTANEOUS | Status: DC | PRN
  Administered 2017-01-28: 1 via TOPICAL

## 2017-01-28 MED ORDER — PHENYLEPHRINE HCL 10 MG/ML IJ SOLN
INTRAVENOUS | Status: DC | PRN
  Administered 2017-01-28: 25 ug/min via INTRAVENOUS

## 2017-01-28 MED ORDER — BUPIVACAINE HCL (PF) 0.25 % IJ SOLN
INTRAMUSCULAR | Status: AC
  Filled 2017-01-28: qty 30

## 2017-01-28 MED ORDER — MIDAZOLAM HCL 2 MG/2ML IJ SOLN
1.0000 mg | INTRAMUSCULAR | Status: DC | PRN
  Administered 2017-01-28 – 2017-01-29 (×4): 1 mg via INTRAVENOUS
  Filled 2017-01-28 (×3): qty 2

## 2017-01-28 MED ORDER — EPINEPHRINE PF 1 MG/ML IJ SOLN
INTRAMUSCULAR | Status: AC
  Filled 2017-01-28: qty 1

## 2017-01-28 MED ORDER — PHENYLEPHRINE 40 MCG/ML (10ML) SYRINGE FOR IV PUSH (FOR BLOOD PRESSURE SUPPORT)
PREFILLED_SYRINGE | INTRAVENOUS | Status: AC
  Filled 2017-01-28: qty 10

## 2017-01-28 MED ORDER — ROCURONIUM BROMIDE 100 MG/10ML IV SOLN
INTRAVENOUS | Status: DC | PRN
  Administered 2017-01-28: 20 mg via INTRAVENOUS
  Administered 2017-01-28: 30 mg via INTRAVENOUS
  Administered 2017-01-28: 50 mg via INTRAVENOUS
  Administered 2017-01-28: 10 mg via INTRAVENOUS
  Administered 2017-01-28: 5 mg via INTRAVENOUS

## 2017-01-28 MED ORDER — SODIUM CHLORIDE 0.9 % IV SOLN
INTRAVENOUS | Status: DC | PRN
  Administered 2017-01-28: 10:00:00 via INTRAVENOUS

## 2017-01-28 MED ORDER — LIDOCAINE 2% (20 MG/ML) 5 ML SYRINGE
INTRAMUSCULAR | Status: AC
  Filled 2017-01-28: qty 5

## 2017-01-28 MED ORDER — SUCCINYLCHOLINE CHLORIDE 200 MG/10ML IV SOSY
PREFILLED_SYRINGE | INTRAVENOUS | Status: DC | PRN
  Administered 2017-01-28 (×2): 40 mg via INTRAVENOUS
  Administered 2017-01-28: 120 mg via INTRAVENOUS

## 2017-01-28 MED ORDER — THROMBIN 20000 UNITS EX KIT
PACK | CUTANEOUS | Status: DC | PRN
  Administered 2017-01-28: 20000 [IU] via TOPICAL

## 2017-01-28 MED ORDER — PROPOFOL 10 MG/ML IV BOLUS
INTRAVENOUS | Status: AC
  Filled 2017-01-28: qty 40

## 2017-01-28 MED ORDER — HYDROMORPHONE HCL 1 MG/ML IJ SOLN
INTRAMUSCULAR | Status: AC
  Administered 2017-01-28: 0.5 mg via INTRAVENOUS
  Filled 2017-01-28: qty 0.5

## 2017-01-28 MED ORDER — ONDANSETRON HCL 4 MG/2ML IJ SOLN
INTRAMUSCULAR | Status: AC
  Filled 2017-01-28: qty 2

## 2017-01-28 MED ORDER — ARTIFICIAL TEARS OP OINT
TOPICAL_OINTMENT | OPHTHALMIC | Status: DC | PRN
  Administered 2017-01-28: 1 via OPHTHALMIC

## 2017-01-28 MED ORDER — CEFAZOLIN SODIUM-DEXTROSE 2-4 GM/100ML-% IV SOLN
2.0000 g | Freq: Three times a day (TID) | INTRAVENOUS | Status: AC
  Administered 2017-01-28 – 2017-01-29 (×2): 2 g via INTRAVENOUS
  Filled 2017-01-28 (×2): qty 100

## 2017-01-28 MED ORDER — PROPOFOL 10 MG/ML IV BOLUS
INTRAVENOUS | Status: DC | PRN
  Administered 2017-01-28: 40 mg via INTRAVENOUS
  Administered 2017-01-28: 150 mg via INTRAVENOUS

## 2017-01-28 MED ORDER — FENTANYL CITRATE (PF) 100 MCG/2ML IJ SOLN
50.0000 ug | INTRAMUSCULAR | Status: DC | PRN
Start: 2017-01-28 — End: 2017-01-29
  Administered 2017-01-28 (×2): 50 ug via INTRAVENOUS
  Filled 2017-01-28: qty 2

## 2017-01-28 MED ORDER — DEXMEDETOMIDINE HCL IN NACL 200 MCG/50ML IV SOLN
INTRAVENOUS | Status: DC | PRN
  Administered 2017-01-28: .3 ug/kg/h via INTRAVENOUS

## 2017-01-28 MED ORDER — ARTIFICIAL TEARS OP OINT
TOPICAL_OINTMENT | OPHTHALMIC | Status: AC
  Filled 2017-01-28: qty 3.5

## 2017-01-28 MED ORDER — SODIUM CHLORIDE 0.9% FLUSH
3.0000 mL | Freq: Two times a day (BID) | INTRAVENOUS | Status: DC
  Administered 2017-01-28 – 2017-01-29 (×3): 3 mL via INTRAVENOUS

## 2017-01-28 MED ORDER — FENTANYL CITRATE (PF) 100 MCG/2ML IJ SOLN
INTRAMUSCULAR | Status: DC | PRN
  Administered 2017-01-28 (×6): 50 ug via INTRAVENOUS

## 2017-01-28 MED ORDER — LIDOCAINE HCL (CARDIAC) 20 MG/ML IV SOLN
INTRAVENOUS | Status: DC | PRN
  Administered 2017-01-28: 80 mg via INTRAVENOUS

## 2017-01-28 MED ORDER — INSULIN ASPART 100 UNIT/ML ~~LOC~~ SOLN
0.0000 [IU] | SUBCUTANEOUS | Status: DC
  Administered 2017-01-28: 3 [IU] via SUBCUTANEOUS
  Administered 2017-01-29: 2 [IU] via SUBCUTANEOUS
  Administered 2017-01-29: 3 [IU] via SUBCUTANEOUS
  Administered 2017-01-29 – 2017-01-30 (×4): 2 [IU] via SUBCUTANEOUS
  Administered 2017-01-30: 3 [IU] via SUBCUTANEOUS
  Administered 2017-01-30 (×2): 2 [IU] via SUBCUTANEOUS

## 2017-01-28 MED ORDER — DEXAMETHASONE SODIUM PHOSPHATE 10 MG/ML IJ SOLN
6.0000 mg | Freq: Four times a day (QID) | INTRAMUSCULAR | Status: AC
  Administered 2017-01-28 – 2017-01-30 (×8): 6 mg via INTRAVENOUS
  Filled 2017-01-28: qty 0.6
  Filled 2017-01-28: qty 1
  Filled 2017-01-28 (×5): qty 0.6
  Filled 2017-01-28: qty 1

## 2017-01-28 MED ORDER — EPHEDRINE 5 MG/ML INJ
INTRAVENOUS | Status: AC
  Filled 2017-01-28: qty 10

## 2017-01-28 MED ORDER — LACTATED RINGERS IV SOLN
INTRAVENOUS | Status: DC | PRN
  Administered 2017-01-28: 07:00:00 via INTRAVENOUS

## 2017-01-28 MED ORDER — SODIUM CHLORIDE 0.9% FLUSH: 3.0000 mL | INTRAVENOUS | Status: DC | PRN

## 2017-01-28 MED ORDER — DEXMEDETOMIDINE HCL IN NACL 200 MCG/50ML IV SOLN
0.4000 ug/kg/h | INTRAVENOUS | Status: DC
  Administered 2017-01-28: 0.4 ug/kg/h via INTRAVENOUS
  Administered 2017-01-28: 0.7 ug/kg/h via INTRAVENOUS
  Filled 2017-01-28 (×2): qty 50

## 2017-01-28 MED ORDER — PROMETHAZINE HCL 25 MG/ML IJ SOLN
INTRAMUSCULAR | Status: AC
  Administered 2017-01-28: 12.5 mg via INTRAVENOUS
  Filled 2017-01-28: qty 1

## 2017-01-28 MED ORDER — HYDROMORPHONE HCL 1 MG/ML IJ SOLN
INTRAMUSCULAR | Status: AC
  Filled 2017-01-28: qty 0.5

## 2017-01-28 MED ORDER — SODIUM CHLORIDE 0.9 % IV SOLN: 250.0000 mL | INTRAVENOUS | Status: DC

## 2017-01-28 MED ORDER — PHENYLEPHRINE HCL 10 MG/ML IJ SOLN
INTRAMUSCULAR | Status: DC | PRN
  Administered 2017-01-28: 40 ug via INTRAVENOUS
  Administered 2017-01-28 (×5): 80 ug via INTRAVENOUS
  Administered 2017-01-28 (×2): 40 ug via INTRAVENOUS
  Administered 2017-01-28 (×2): 80 ug via INTRAVENOUS

## 2017-01-28 MED ORDER — FENTANYL CITRATE (PF) 100 MCG/2ML IJ SOLN
INTRAMUSCULAR | Status: AC
  Filled 2017-01-28: qty 4

## 2017-01-28 MED ORDER — CEFAZOLIN SODIUM-DEXTROSE 2-3 GM-% IV SOLR
INTRAVENOUS | Status: DC | PRN
  Administered 2017-01-28: 2 g via INTRAVENOUS

## 2017-01-28 MED ORDER — DEXAMETHASONE SODIUM PHOSPHATE 10 MG/ML IJ SOLN
INTRAMUSCULAR | Status: AC
  Filled 2017-01-28: qty 1

## 2017-01-28 MED ORDER — ONDANSETRON HCL 4 MG/2ML IJ SOLN
INTRAMUSCULAR | Status: AC
  Filled 2017-01-28: qty 4

## 2017-01-28 MED ORDER — DOUBLE ANTIBIOTIC 500-10000 UNIT/GM EX OINT
TOPICAL_OINTMENT | CUTANEOUS | Status: AC
  Filled 2017-01-28: qty 1

## 2017-01-28 MED ORDER — DEXAMETHASONE SODIUM PHOSPHATE 10 MG/ML IJ SOLN
INTRAMUSCULAR | Status: DC | PRN
  Administered 2017-01-28: 10 mg via INTRAVENOUS

## 2017-01-28 MED ORDER — MIDAZOLAM HCL 2 MG/2ML IJ SOLN
INTRAMUSCULAR | Status: AC
  Administered 2017-01-28: 2 mg via INTRAVENOUS
  Filled 2017-01-28: qty 2

## 2017-01-28 MED ORDER — LIDOCAINE 2% (20 MG/ML) 5 ML SYRINGE
INTRAMUSCULAR | Status: AC
  Filled 2017-01-28: qty 10

## 2017-01-28 MED ORDER — ORAL CARE MOUTH RINSE
15.0000 mL | Freq: Four times a day (QID) | OROMUCOSAL | Status: DC
  Administered 2017-01-29 – 2017-01-30 (×5): 15 mL via OROMUCOSAL

## 2017-01-28 MED ORDER — ROCURONIUM BROMIDE 50 MG/5ML IV SOSY
PREFILLED_SYRINGE | INTRAVENOUS | Status: AC
  Filled 2017-01-28: qty 15

## 2017-01-28 MED ORDER — THROMBIN 20000 UNITS EX SOLR
CUTANEOUS | Status: AC
  Filled 2017-01-28: qty 20000

## 2017-01-28 MED ORDER — ALBUTEROL SULFATE HFA 108 (90 BASE) MCG/ACT IN AERS
INHALATION_SPRAY | RESPIRATORY_TRACT | Status: AC
  Filled 2017-01-28: qty 6.7

## 2017-01-28 MED ORDER — SODIUM CHLORIDE 0.9 % IV SOLN
Freq: Once | INTRAVENOUS | Status: AC
  Administered 2017-01-28: 10 mL/h via INTRAVENOUS

## 2017-01-28 MED ORDER — HEMOSTATIC AGENTS (NO CHARGE) OPTIME
TOPICAL | Status: DC | PRN
  Administered 2017-01-28 (×2): 1 via TOPICAL

## 2017-01-28 MED ORDER — 0.9 % SODIUM CHLORIDE (POUR BTL) OPTIME
TOPICAL | Status: DC | PRN
  Administered 2017-01-28 (×2): 1000 mL

## 2017-01-28 MED ORDER — GLYCOPYRROLATE 0.2 MG/ML IJ SOLN
INTRAMUSCULAR | Status: DC | PRN
  Administered 2017-01-28: 0.2 mg via INTRAVENOUS

## 2017-01-28 MED ORDER — CHLORHEXIDINE GLUCONATE 0.12% ORAL RINSE (MEDLINE KIT)
15.0000 mL | Freq: Two times a day (BID) | OROMUCOSAL | Status: DC
  Administered 2017-01-28 – 2017-01-29 (×3): 15 mL via OROMUCOSAL

## 2017-01-28 MED FILL — Sodium Chloride IV Soln 0.9%: INTRAVENOUS | Qty: 1000 | Status: AC

## 2017-01-28 MED FILL — Heparin Sodium (Porcine) Inj 1000 Unit/ML: INTRAMUSCULAR | Qty: 30 | Status: AC

## 2017-01-28 MED FILL — Thrombin For Soln 20000 Unit: CUTANEOUS | Qty: 1 | Status: AC

## 2017-01-28 SURGICAL SUPPLY — 98 items
APL SKNCLS STERI-STRIP NONHPOA (GAUZE/BANDAGES/DRESSINGS) ×1
BENZOIN TINCTURE PRP APPL 2/3 (GAUZE/BANDAGES/DRESSINGS) ×4 IMPLANT
BIT DRILL 16 (BIT) ×1 IMPLANT
BIT DRILL 16MM (BIT) ×1
BIT DRILL MOUNTAINEER FIX 14 (BIT) ×1
BIT DRILL MOUNTAINEER FIX 14MM (BIT) IMPLANT
BUR NEURO DRILL SOFT 3.0X3.8M (BURR) ×3 IMPLANT
BUR PRESCISION 1.7 ELITE (BURR) ×3 IMPLANT
CATH COUDE FOLEY 5CC 14FR (CATHETERS) ×2 IMPLANT
CLOSURE STERI-STRIP 1/2X4 (GAUZE/BANDAGES/DRESSINGS) ×1
CLOSURE WOUND 1/2 X4 (GAUZE/BANDAGES/DRESSINGS) ×2
CLSR STERI-STRIP ANTIMIC 1/2X4 (GAUZE/BANDAGES/DRESSINGS) ×1 IMPLANT
CONT SPEC STER OR (MISCELLANEOUS) ×3 IMPLANT
CORDS BIPOLAR (ELECTRODE) ×3 IMPLANT
COVER SURGICAL LIGHT HANDLE (MISCELLANEOUS) ×3 IMPLANT
DECANTER SPIKE VIAL GLASS SM (MISCELLANEOUS) ×4 IMPLANT
DRAIN CHANNEL 10F 3/8 F FF (DRAIN) IMPLANT
DRAIN CHANNEL 15F RND FF W/TCR (WOUND CARE) IMPLANT
DRAIN HEMOVAC 1/8 X 5 (WOUND CARE) IMPLANT
DRAPE C-ARM 42X72 X-RAY (DRAPES) IMPLANT
DRAPE INCISE IOBAN 66X45 STRL (DRAPES) ×3 IMPLANT
DRAPE PED LAPAROTOMY (DRAPES) ×3 IMPLANT
DRAPE POUCH INSTRU U-SHP 10X18 (DRAPES) ×3 IMPLANT
DRAPE PROXIMA HALF (DRAPES) ×11 IMPLANT
DRAPE SURG 17X23 STRL (DRAPES) ×16 IMPLANT
DRAPE TABLE COVER HEAVY DUTY (DRAPES) ×3 IMPLANT
DRILL BIT MOUNTAINEER FIX 14MM (BIT) ×3
DRSG MEPILEX BORDER 4X8 (GAUZE/BANDAGES/DRESSINGS) ×3 IMPLANT
DURAPREP 26ML APPLICATOR (WOUND CARE) ×3 IMPLANT
ELECT BLADE 4.0 EZ CLEAN MEGAD (MISCELLANEOUS) ×3
ELECT CAUTERY BLADE 6.4 (BLADE) ×3 IMPLANT
ELECT REM PT RETURN 9FT ADLT (ELECTROSURGICAL) ×3
ELECTRODE BLDE 4.0 EZ CLN MEGD (MISCELLANEOUS) ×1 IMPLANT
ELECTRODE REM PT RTRN 9FT ADLT (ELECTROSURGICAL) ×1 IMPLANT
EVACUATOR SILICONE 100CC (DRAIN) IMPLANT
GAUZE SPONGE 4X4 12PLY STRL (GAUZE/BANDAGES/DRESSINGS) ×3 IMPLANT
GAUZE SPONGE 4X4 16PLY XRAY LF (GAUZE/BANDAGES/DRESSINGS) ×8 IMPLANT
GLOVE BIO SURGEON STRL SZ 6.5 (GLOVE) ×3 IMPLANT
GLOVE BIO SURGEON STRL SZ7 (GLOVE) ×11 IMPLANT
GLOVE BIO SURGEON STRL SZ7.5 (GLOVE) ×2 IMPLANT
GLOVE BIO SURGEON STRL SZ8 (GLOVE) ×3 IMPLANT
GLOVE BIO SURGEONS STRL SZ 6.5 (GLOVE) ×3
GLOVE BIOGEL PI IND STRL 6.5 (GLOVE) IMPLANT
GLOVE BIOGEL PI IND STRL 7.5 (GLOVE) ×1 IMPLANT
GLOVE BIOGEL PI IND STRL 8 (GLOVE) ×1 IMPLANT
GLOVE BIOGEL PI INDICATOR 6.5 (GLOVE) ×6
GLOVE BIOGEL PI INDICATOR 7.5 (GLOVE) ×2
GLOVE BIOGEL PI INDICATOR 8 (GLOVE) ×2
GOWN STRL REUS W/ TWL LRG LVL3 (GOWN DISPOSABLE) ×4 IMPLANT
GOWN STRL REUS W/ TWL XL LVL3 (GOWN DISPOSABLE) ×1 IMPLANT
GOWN STRL REUS W/TWL LRG LVL3 (GOWN DISPOSABLE) ×12
GOWN STRL REUS W/TWL XL LVL3 (GOWN DISPOSABLE) ×3
IV CATH 14GX2 1/4 (CATHETERS) ×3 IMPLANT
KIT BASIN OR (CUSTOM PROCEDURE TRAY) ×3 IMPLANT
KIT ROOM TURNOVER OR (KITS) ×3 IMPLANT
NDL HYPO 25GX1X1/2 BEV (NEEDLE) ×1 IMPLANT
NDL PRECISIONGLIDE 27X1.5 (NEEDLE) ×1 IMPLANT
NEEDLE HYPO 25GX1X1/2 BEV (NEEDLE) ×3 IMPLANT
NEEDLE PRECISIONGLIDE 27X1.5 (NEEDLE) ×3 IMPLANT
NS IRRIG 1000ML POUR BTL (IV SOLUTION) ×3 IMPLANT
PACK LAMINECTOMY ORTHO (CUSTOM PROCEDURE TRAY) ×3 IMPLANT
PACK UNIVERSAL I (CUSTOM PROCEDURE TRAY) ×2 IMPLANT
PAD ARMBOARD 7.5X6 YLW CONV (MISCELLANEOUS) ×6 IMPLANT
PATTIES SURGICAL .5 X.5 (GAUZE/BANDAGES/DRESSINGS) ×3 IMPLANT
PATTIES SURGICAL .5 X1 (DISPOSABLE) ×2 IMPLANT
PATTIES SURGICAL .5 X3 (DISPOSABLE) IMPLANT
PATTIES SURGICAL .5X1.5 (GAUZE/BANDAGES/DRESSINGS) ×3 IMPLANT
PIN DISTRACTION 14 (PIN) ×4 IMPLANT
PIN MAYFIELD SKULL DISP (PIN) ×3 IMPLANT
PUTTY DBX 1CC (Putty) ×6 IMPLANT
PUTTY DBX 1CC DEPUY (Putty) IMPLANT
ROD MOUTAINEER 3.5X200MM (Rod) ×2 IMPLANT
SCREW F A 3.5X14 (Screw) ×12 IMPLANT
SCREW INNER (Screw) ×16 IMPLANT
SCREW MOUNTAINEER 4.0X26MM (Screw) ×4 IMPLANT
SPONGE GAUZE 4X4 12PLY STER LF (GAUZE/BANDAGES/DRESSINGS) ×2 IMPLANT
SPONGE INTESTINAL PEANUT (DISPOSABLE) ×4 IMPLANT
SPONGE SURGIFOAM ABS GEL 100 (HEMOSTASIS) ×3 IMPLANT
STRIP CLOSURE SKIN 1/2X4 (GAUZE/BANDAGES/DRESSINGS) ×2 IMPLANT
SURGIFLO W/THROMBIN 8M KIT (HEMOSTASIS) ×2 IMPLANT
SUT BONE WAX W31G (SUTURE) ×2 IMPLANT
SUT MNCRL AB 4-0 PS2 18 (SUTURE) ×3 IMPLANT
SUT VIC AB 0 CT1 18XCR BRD 8 (SUTURE) ×1 IMPLANT
SUT VIC AB 0 CT1 8-18 (SUTURE) ×3
SUT VIC AB 1 CT1 18XCR BRD 8 (SUTURE) ×2 IMPLANT
SUT VIC AB 1 CT1 8-18 (SUTURE) ×6
SUT VIC AB 2-0 CT2 18 VCP726D (SUTURE) ×7 IMPLANT
SYR BULB IRRIGATION 50ML (SYRINGE) ×3 IMPLANT
SYR CONTROL 10ML LL (SYRINGE) ×3 IMPLANT
SYRINGE 1CC SLIP TB (MISCELLANEOUS) ×4 IMPLANT
TAPE CLOTH 4X10 WHT NS (GAUZE/BANDAGES/DRESSINGS) ×3 IMPLANT
TAPE CLOTH SURG 4X10 WHT LF (GAUZE/BANDAGES/DRESSINGS) ×2 IMPLANT
TEMPLATE ROD MOUNTAINEER (INSTRUMENTS) ×2 IMPLANT
TOWEL OR 17X24 6PK STRL BLUE (TOWEL DISPOSABLE) ×3 IMPLANT
TOWEL OR 17X26 10 PK STRL BLUE (TOWEL DISPOSABLE) ×3 IMPLANT
TRAY FOLEY CATH 16FRSI W/METER (SET/KITS/TRAYS/PACK) ×3 IMPLANT
WATER STERILE IRR 1000ML POUR (IV SOLUTION) ×3 IMPLANT
YANKAUER SUCT BULB TIP NO VENT (SUCTIONS) ×3 IMPLANT

## 2017-01-28 NOTE — Progress Notes (Signed)
   On .1 of [percedex he has bradycardia with sbp 160s (HR 40s but occ 30s) . But maintains calm  Plan Dc precedex   Dr. Brand Males, M.D., [facility].C.P Pulmonary and Critical Care Medicine Staff Physician [facility] Pager: [phone], If no answer or between  15:00h - 7:00h: call [phone]  01/28/2017 7:06 PM

## 2017-01-28 NOTE — Transfer of Care (Signed)
Immediate Anesthesia Transfer of Care Note  Patient: Russell [name]  Procedure(s) Performed: Procedure(s) with comments: POSTERIOR SPINAL FUSION CERVICAL 3-4, CERVICAL 4-5, CERVICAL 5-6, CERVICAL 6-7 WITH INSTRUMENATION AND ALLOGRAFT (N/A) - POSTERIOR SPINAL FUSION CERVICAL 3-4, CERVICAL 4-5, CERVICAL 5-6, CERVICAL 6-7 WITH INSTRUMENATION AND ALLOGRAFT  Patient Location: PACU  Anesthesia Type:General  Level of Consciousness: sedated, responds to stimulation and Patient remains intubated per anesthesia plan  Airway & Oxygen Therapy: Patient remains intubated per anesthesia plan and Patient placed on Ventilator (see vital sign flow sheet for setting)  Post-op Assessment: Report given to RN and Post -op Vital signs reviewed and stable  Post vital signs: Reviewed and stable  Last Vitals:  Vitals:   01/28/17 0059 01/28/17 0610  BP: (!) 158/77 137/62  Pulse: 62 76  Resp: 18 18  Temp: 37.2 C 37.4 C    Last Pain:  Vitals:   01/28/17 0610  TempSrc: Oral  PainSc:       Patients Stated Pain Goal: 3 ([id number])  Complications: No apparent anesthesia complications

## 2017-01-28 NOTE — Consult Note (Signed)
PULMONARY / CRITICAL CARE MEDICINE   Name: Russell [name] MRN: [id number] DOB: 1941-03-21    ADMISSION DATE:  01/27/2017 CONSULTATION DATE:  01/28/17  REFERRING MD:  Dr. Lynann Bologna   CHIEF COMPLAINT:    HISTORY OF PRESENT ILLNESS:   76 y/o M admitted 2/7 for planned two stage cervical decompression and fusion of cervical spine.    The patient is followed by Dr. Lynann Bologna.  He was seen 2/6 for complaints of bilateral arm pain and weakness.  MRI showed severe cervical myelopathy.   The patient unfortunately failed conservative management and was planned for decompression fusion of cervical spine 3-4, followed by posterior cervical decompression of C3-C7.  The patient tolerated the anterior decompression and was extubated (01/27/17).  Post-operatively, he was noted to have frequent clearing of his throat and difficulty managing his oral secretions.  On 2/8, he returned for the posterior portion of the decompression.  He received 1 unit of PRBC's intra-op.  EBL ~ 100 ml.  Hgb on 2/1 12.1.  Post-operatively, decision was made for the patient to remain intubated for airway protection.    PCCM consulted for post-operative ICU management.    PMH:  Arthritis, BPH, COPD, CAD, Mitral Regurgitaion, HTN, HLD, PAD, Seizures, glaucoma, DM II (diet controlled) and GERD.    PAST MEDICAL HISTORY :  He  has a past medical history of Arthritis; BPH with obstruction/lower urinary tract symptoms; BPH with urinary obstruction (01/15/2016); COPD (chronic obstructive pulmonary disease) ([place]); Coronary atherosclerosis; Depression; Diverticulosis of colon; Dyspnea; ED (erectile dysfunction); Full dentures; GERD (gastroesophageal reflux disease); Glaucoma; History of gastritis; History of scabies; History of seizure; History of syncope; Hyperlipidemia, mixed; Hypertension; Mitral regurgitation; Peripheral arterial disease ([place]); Prostate cancer ([place]); Seizures ([place]) (03/2014); Type 2 diabetes, diet controlled ([place]); and Wears  glasses.  PAST SURGICAL HISTORY: He  has a past surgical history that includes Knee arthroscopy (Right, 2008); Capsulotomy (Right, 01/26/2013); Yag laser application (Right, [id number]); I&D extremity (Right, 05/24/2013); Foot surgery (Left); Carpectomy (Right, 03/13/2014); Carpometacarpal (cmc) fusion of thumb (Right, 03/13/2014); Cataract extraction w/ intraocular lens  implant, bilateral (2009); YAG LASER CAPSULOTOMY, LEFT EYE (01-08-2011); Anterior cervical decomp/discectomy fusion (10-16-2009); EXCISIONAL DEBRIDEMENT AND REPAIR RIGHT QUADRICEP TENDON (07-05-2000); Laryngoscopy and esophagoscopy (06-13-2010); EXCISION MASS LEFT FACE , SUBORBITAL AREA, PLASTER RECONSTRUCTION (02-09-2011); SHOULDER ARTHROSCOPY/ DEBRIDEMENT LABRAL TEAR/  BICEPS TENOTOMY (Left, 09-24-2011); Colonoscopy (08-31-2007); Esophagogastroduodenoscopy (last one 09-11-2008); transthoracic echocardiogram (02-21-2010); Tonsillectomy (as child); Cryoablation (N/A, 01/14/2015); Insertion of suprapubic catheter (N/A, 01/14/2015); Orchiectomy (Left, 02/24/2015); Carpectomy (Left, 10/22/2015); Mass excision (Left, 10/22/2015); and Colonoscopy.  Allergies  Allergen Reactions  . No Known Allergies     No current facility-administered medications on file prior to encounter.    Current Outpatient Prescriptions on File Prior to Encounter  Medication Sig  . albuterol (PROVENTIL HFA;VENTOLIN HFA) 108 (90 BASE) MCG/ACT inhaler Inhale 2 puffs into the lungs every 6 (six) hours as needed for wheezing or shortness of breath.  . AZOPT 1 % ophthalmic suspension Place 1 drop into the left eye 3 (three) times daily.   . bimatoprost (LUMIGAN) 0.01 % SOLN Place 1 drop into both eyes at bedtime.  . brimonidine-timolol (COMBIGAN) 0.2-0.5 % ophthalmic solution Place 1 drop into both eyes 2 (two) times daily.   Marland Kitchen gabapentin (NEURONTIN) 300 MG capsule Take 1 capsule by mouth 2 (two) times daily.  Marland Kitchen HYDROcodone-acetaminophen (NORCO) 7.5-325 MG tablet Take 1 tablet by  mouth every 8 (eight) hours as needed for moderate pain. pain  . losartan (COZAAR) 100 MG tablet Take  1 tablet by mouth daily.   . Multiple Vitamins-Minerals (CENTRUM SILVER ADULT 50+ PO) Take 1 tablet by mouth daily.  Marland Kitchen oxybutynin (DITROPAN) 5 MG tablet TAKE 1 TABLET EVERY 12 HOURS  . pravastatin (PRAVACHOL) 80 MG tablet Take 1 tablet by mouth every evening.  . tamsulosin (FLOMAX) 0.4 MG CAPS capsule Take 0.4 mg by mouth at bedtime.     FAMILY HISTORY:  His @FAMSTP (<SUBSCRIPT> error)@  SOCIAL HISTORY: He  reports that he quit smoking about 4 months ago. His smoking use included Cigarettes. He has a 59.00 pack-year smoking history. He has never used smokeless tobacco. He reports that he does not drink alcohol or use drugs.  REVIEW OF SYSTEMS:   Unable to complete as patient is on mechanical ventilation.   SUBJECTIVE:  RN reports pt comfortable on 0.7 of precedex  VITAL SIGNS: BP 128/72 (BP Location: Right Arm)   Pulse (!) 56   Temp 97.3 F (36.3 C)   Resp 17   Wt 173 lb (78.5 kg)   SpO2 100%   BMI 22.21 kg/m   HEMODYNAMICS:    VENTILATOR SETTINGS: Vent Mode: PRVC FiO2 (%):  [40 %-60 %] 40 % Set Rate:  [16 bmp] 16 bmp Vt Set:  [530 mL] 530 mL PEEP:  [5 cmH20] 5 cmH20 Plateau Pressure:  [17 cmH20] 17 cmH20  INTAKE / OUTPUT: I/O last 3 completed shifts: In: 1830.2 [P.O.:120; I.V.:1210.2; IV Piggyback:500] Out: 2100 [Urine:1950; Blood:150]  PHYSICAL EXAMINATION: General:  Elderly male in NAD on vent  Neuro:  Sedated, moves all extremities spontaneously with stimulation  HEENT:  ETT, C-Collar in place  Cardiovascular:  s1s2 rrr, no m/rg  Lungs:  Even/non-labored, lungs bilaterally clear  Abdomen:  Obese/soft, bsx4 active  Musculoskeletal:  No acute deformities  Skin:  Warm/dry, no edema   LABS:  BMET No results for input(s): NA, K, CL, CO2, BUN, CREATININE, GLUCOSE in the last 168 hours.  Electrolytes No results for input(s): CALCIUM, MG, PHOS in the last 168  hours.  CBC No results for input(s): WBC, HGB, HCT, PLT in the last 168 hours.  Coag's No results for input(s): APTT, INR in the last 168 hours.  Sepsis Markers No results for input(s): LATICACIDVEN, PROCALCITON, O2SATVEN in the last 168 hours.  ABG  Recent Labs Lab 01/28/17 1230  PHART 7.353  PCO2ART 47.4  PO2ART 159*    Liver Enzymes No results for input(s): AST, ALT, ALKPHOS, BILITOT, ALBUMIN in the last 168 hours.  Cardiac Enzymes No results for input(s): TROPONINI, PROBNP in the last 168 hours.  Glucose  Recent Labs Lab 01/28/17 1137  GLUCAP 126*    Imaging Dg Cervical Spine 2 Or 3 Views  Result Date: 01/28/2017 CLINICAL DATA:  Posterior cervical spine fusion EXAM: CERVICAL SPINE - 2-3 VIEW COMPARISON:  Two intraoperative images are submitted and are compared to prior CT cervical spine 10/23/2016 FINDINGS: Image #1 at [id number] hours: Metallic probe via posterior approach overlies the spinous process of C4. Prior C4-C6 anterior fusion. Anterior hardware post fusion at C3-C4. Degenerative disc disease changes C6-C7. Image #2 at 1032 hours: Anterior fusion C3-C4 again seen. BILATERAL pedicle screws and posterior bars are seen from posterior C3-C7 fusion. Pedicle screws are present at C3, C4, C5 and C7. Alignment stable. IMPRESSION: Intraoperative images during posterior fusion of C3-C7. Electronically Signed   By: Lavonia Dana M.D.   On: 01/28/2017 11:14     STUDIES:    CULTURES:   ANTIBIOTICS: Ancef 2/8 (post-op)   SIGNIFICANT EVENTS:  2/7 Admit for 2 stage cervical decompression   LINES/TUBES: ETT 2/8 >>   DISCUSSION: 76 y/o M admitted 2/7 for two stage cervical decompression.  Anterior fusion 2/7 with some difficulty swallowing/manageing secretions post-op, returned 2/8 for posterior.  Decision made not to extubate post-operatively.    ASSESSMENT / PLAN:  PULMONARY A: Acute Respiratory Insufficiency - post operative decompression / fusion  Hx COPD P:    PRVC 8 cc/kg  Wean PEEP / FiO2 for sats >90% CXR post-op and in am  Follow up post op ABG Duoneb PRN  No extubation until positive cuff leak Daily SBT Decadron 6mg  IV Q6 for airway swelling   CARDIOVASCULAR A:  Hx CAD, HTN, HLD P:  ICU admit Continue home cozaar, pravachol   RENAL A:   Hx BPH P:   Repeat labs post-op, BMP now  NS @ KVO  Continue ditropan, flomax   GASTROINTESTINAL A:   Hx GERD P:   PPI for SUP  NPO Place OGT for medication delivery / feeding  Begin TF in am if remains intubated NS @ KVO   HEMATOLOGIC A:   No Acute Issues - EBL from OR ~100 ml, received 1 unit PRBC intra-op P:  Repeat labs post-op, CBC now  SCD's for DVT prophylaxis   INFECTIOUS A:   Post-Op Anterior / Posterior Cervical Fusion  P:   Monitor fever curve / WBC  ENDOCRINE A:   Hyperglycemia    P:   SSI   NEUROLOGIC A:   Post-operative Pain  P:   RASS goal: -1 to -2 Maintain C-Collar Precedex gtt for sedation  PRN Fentanyl / Versed     FAMILY  - Updates: No family available at bedside.    - Inter-disciplinary family meet or Palliative Care meeting due by:  2/15  NP CC Time: 83 minutes  Noe Gens, NP-C [place] [facility] Pgr: [phone] or if no answer [phone] 01/28/2017, 1:22 PM   STAFF NOTE: I, Merrie Roof, MD FACP have personally reviewed patient's available data, including medical history, events of note, physical examination and test results as part of my evaluation. I have discussed with resident/NP and other care providers such as pharmacist, RN and RRT. In addition, I personally evaluated patient and elicited key findings of: sedated slight coarse BS, collar neck, ett, abdo soft, low muscle mass, d/w anesthesia (appreciated history), airway described as edematous post anterior then posterior cervicale, I agree his risk of extubation now is concerning for poor airway protection and secretion control, will obtain stat pcxr, obtain  abg on current settings, ensure NGT crosses diaphragm, re assess lytes and cbc now, scd, add decadron for airway swelling concerns, in am cpap 5 ps5, goal 30 min am with leak testing with pretest probability high will be meaningful, no plans to feed overnight The patient is critically ill with multiple organ systems failure and requires high complexity decision making for assessment and support, frequent evaluation and titration of therapies, application of advanced monitoring technologies and extensive interpretation of multiple databases.   Critical Care Time devoted to patient care services described in this note is 40 Minutes. This time reflects time of care of this signee: Merrie Roof, MD FACP. This critical care time does not reflect procedure time, or teaching time or supervisory time of PA/NP/Med student/Med Resident etc but could involve care discussion time. Rest per NP/medical resident whose note is outlined above and that I agree with   Lavon Paganini. Titus Mould, MD, [place] Pgr: [phone] [place]  [facility] 01/28/2017 3:41 PM

## 2017-01-28 NOTE — Anesthesia Postprocedure Evaluation (Signed)
Anesthesia Post Note  Patient: Russell [name]  Procedure(s) Performed: Procedure(s) (LRB): POSTERIOR SPINAL FUSION CERVICAL 3-4, CERVICAL 4-5, CERVICAL 5-6, CERVICAL 6-7 WITH INSTRUMENATION AND ALLOGRAFT (N/A)  Patient location during evaluation: PACU Anesthesia Type: General Level of consciousness: sedated Pain management: pain level controlled Vital Signs Assessment: post-procedure vital signs reviewed and stable Respiratory status: spontaneous breathing and respiratory function stable Cardiovascular status: stable Anesthetic complications: no       Last Vitals:  Vitals:   01/28/17 1525 01/28/17 1603  BP: (!) 144/77 (!) 173/77  Pulse: (!) 52 (!) 49  Resp: 18 17  Temp: 36.2 C     Last Pain:  Vitals:   01/28/17 1525  TempSrc:   PainSc: Asleep                 Tahiri Shareef DANIEL

## 2017-01-28 NOTE — Progress Notes (Signed)
Patient was transported from PACU to 2S14 without any apparent complications.

## 2017-01-28 NOTE — Progress Notes (Signed)
RN spoke with Darleen in [place]. Transport will be sent for patient between 0600-0615. Patient has been NPO since 0010. Patient informed consent in patient chart. RN completing CHG bath and will continue to monitor patient.

## 2017-01-28 NOTE — Anesthesia Postprocedure Evaluation (Addendum)
Anesthesia Post Note  Patient: Russell [name]  Procedure(s) Performed: Procedure(s) (LRB): POSTERIOR SPINAL FUSION CERVICAL 3-4, CERVICAL 4-5, CERVICAL 5-6, CERVICAL 6-7 WITH INSTRUMENATION AND ALLOGRAFT (N/A)  Patient location during evaluation: PACU Anesthesia Type: General Level of consciousness: patient remains intubated per anesthesia plan and sedated Pain management: pain level controlled Vital Signs Assessment: post-procedure vital signs reviewed and stable Respiratory status: patient remains intubated per anesthesia plan Cardiovascular status: stable Anesthetic complications: no       Last Vitals:  Vitals:   01/28/17 1525 01/28/17 1603  BP: (!) 144/77 (!) 173/77  Pulse: (!) 52 (!) 49  Resp: 18 17  Temp: 36.2 C     Last Pain:  Vitals:   01/28/17 1525  TempSrc:   PainSc: Asleep                 SINGER,JAMES DANIEL

## 2017-01-28 NOTE — Op Note (Signed)
NAME:  Russell [name], Russell [name] NO.:  [id number]  MEDICAL RECORD NO.:  [id number]  LOCATION:                                 FACILITY:  PHYSICIAN:  Phylliss Bob, MD      DATE OF BIRTH:  May 20, 1941  DATE OF PROCEDURE:  01/28/2017                              OPERATIVE REPORT   PREOPERATIVE DIAGNOSES: 1. Status post previous C4-C6 anterior cervical discectomy and fusion. 2. Severe progressive cervical myelopathy. 3. Status post previous C3-4 anterior cervical fusion, requiring a     posterior fusion from C3-C7.  POSTOPERATIVE DIAGNOSES: 1. Status post previous C4-C6 anterior cervical discectomy and fusion. 2. Severe progressive cervical myelopathy. 3. Status post previous C3-4 anterior cervical fusion, requiring a     posterior fusion from C3-C7.  PROCEDURE:  (Stage 2 of 2) 1. Posterior spinal fusion, C3-4, C4-5, C5-6, C6-7. 2. Placement of posterior segmental instrumentation, C3-C7. 3. Use of local autograft. 4. Use of morselized allograft - DBX putty. 5. Bilateral C6-7 laminotomy with partial facetectomy and     foraminotomy. 6. Placement and removal of cranial tongs.  SURGEON:  Phylliss Bob, MD.  ASSISTANTPricilla Holm, PA-C.  ANESTHESIA:  General endotracheal anesthesia.  COMPLICATIONS:  None.  DISPOSITION:  Stable.  ESTIMATED BLOOD LOSS:  Minimal.  INDICATIONS FOR SURGERY:  Briefly, Mr. Shampine is a 76 year old male, who is status post a procedure dated January 27, 2017, which did occur on the day prior.  The plan was to bring the patient in today for stage II of what was to be a 2-staged procedure.  Please refer to the operative report on January 27, 2017, for a full account of the patient's history and need for surgical intervention.  OPERATIVE DETAILS:  On January 28, 2017, the patient was brought to surgery and general endotracheal anesthesia was administered.  A [place] head holder was applied by me.  The patient was then rolled prone  onto a hospital bed with gel rolls placed under the patient's chest.  The head was secured into the appropriate position and was secured to the [place] head holder.  The patient's arms were secured to his sides.  All bony prominences were padded.  The neck was prepped and draped and a time-out procedure was performed.  A midline incision was then made spanning approximately C3-C7.  The fascia was incised at the midline.  The paraspinal musculature was bluntly swept to the right and left sides and retracted.  The lamina from C3-C7 was identified and exposed in a subperiosteal fashion.  A lateral intraoperative radiograph did confirm the appropriate operative levels.  At this point, I did decorticate the facet joints at C3-4, C4-5, C5-6, and C6-7 using a high- speed bur.  I then cannulated the C3, C4, and C5 lateral masses using anatomic landmarks.  I then performed a bilateral laminotomy with foraminotomy at C6-7 bilaterally.  In doing so, I was able to decompress the bilateral C7 nerves.  Using anatomic landmarks, I then cannulated the C7 pedicles, using a gearshift probe.  I then advanced a 3.5 mm tap to a depth of approximately 26 mm.  A ball-tip probe was used to confirm that there was no  cortical violation medially, laterally, superiorly, or inferiorly.  I then used a high-speed bur to decorticate the posterior elements from C3-C7.  I then used a total of 2 mL of DBX putty, which was packed along the posterolateral gutters on the right and left sides to help aid in the success of the fusion.  I then placed 14 x 3.5 mm screws bilaterally at C3, C4, and C5.  A 4 x 26 mm screws were placed bilaterally through the C7 pedicles.  A rod was contoured into the appropriate length and into the appropriate degree of lordosis.  The rod was then secured into the tulip heads and caps were placed and a final locking procedure was performed.  I was very pleased with the press-fit of the screws and I  was very pleased with the final lateral intraoperative radiograph.  At this point, the wound was then closed in layers using #1 Vicryl, followed by 2-0 Vicryl, followed by 4-0 Monocryl.  Benzoin and Steri-Strips were applied, followed by sterile dressing.  All instrument counts were correct at the termination of the procedure.  The patient was then rolled supine and the Mayfield head holder was removed.  Of note, it was felt by the anesthesiologist that the patient needed to remain intubated at least until the evening, and potentially until the morning after surgery, given the fact that this was a 2-day procedure. We will therefore proceed per his recommendations.  Of note, Pricilla Holm was my assistant throughout surgery, and did aid in retraction, suctioning, and closure from start to finish.     Phylliss Bob, MD   ______________________________ Phylliss Bob, MD    MD/MEDQ  D:  01/28/2017  T:  01/28/2017  Job:  [id number]

## 2017-01-28 NOTE — Anesthesia Preprocedure Evaluation (Addendum)
Anesthesia Evaluation  Patient identified by MRN, date of birth, ID band Patient awake    Reviewed: Allergy & Precautions, NPO status , Patient's Chart, lab work & pertinent test results  Airway Mallampati: II   Neck ROM: Full    Dental   Pulmonary COPD, former smoker,    breath sounds clear to auscultation       Cardiovascular hypertension, + CAD and + Peripheral Vascular Disease   Rhythm:Regular Rate:Normal     Neuro/Psych    GI/Hepatic GERD  ,  Endo/Other  diabetes  Renal/GU      Musculoskeletal  (+) Arthritis ,   Abdominal   Peds  Hematology  (+) anemia ,   Anesthesia Other Findings   Reproductive/Obstetrics                            Anesthesia Physical Anesthesia Plan  ASA: III  Anesthesia Plan: General   Post-op Pain Management:    Induction: Intravenous  Airway Management Planned: Oral ETT  Additional Equipment: Arterial line  Intra-op Plan:   Post-operative Plan: Post-operative intubation/ventilation and Extubation in OR  Informed Consent: I have reviewed the patients History and Physical, chart, labs and discussed the procedure including the risks, benefits and alternatives for the proposed anesthesia with the patient or authorized representative who has indicated his/her understanding and acceptance.   Dental advisory given  Plan Discussed with: CRNA  Anesthesia Plan Comments: (Plan postop intubation and discussed c patient)       Anesthesia Quick Evaluation

## 2017-01-28 NOTE — H&P (Signed)
Patient underwent a C3/4 ACDF procedure for severe progressive cervical myelopathy and presents today for stage 2 of his 2-staged surgery - a posterior cervical fusion C3-C7. Will proceed as planned.

## 2017-01-28 NOTE — Op Note (Signed)
NAME:  Russell [name], STAGGERS NO.:  [id number]  MEDICAL RECORD NO.:  [id number]  LOCATION:                                 FACILITY:  PHYSICIAN:  Phylliss Bob, MD      DATE OF BIRTH:  12-20-1941  DATE OF PROCEDURE:  01/27/2017                              OPERATIVE REPORT   PREOPERATIVE DIAGNOSES: 1. Cervical myelopathy. 2. Cervical radiculopathy. 3. Status post previous C4-C6 anterior cervical fusion with placement     of previous anterior instrumentation. 4. 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 Cervical myelopathy. 2. Cervical radiculopathy. 3. Status post previous C4-C6 anterior cervical fusion with placement     of previous anterior instrumentation. 4. Large C3/4 HNP 4. Large C3/4 HNP  PROCEDURES (Stage 1 of 2): 1. Anterior cervical decompression and fusion with removal of very     large C3-4 intervertebral disk herniation. 2. Placement of anterior instrumentation, C3-4. 3. Insertion of interbody device x1 (Zero-P intervertebral spacer, 9     mm). 4. Intraoperative use of fluoroscopy. 5. Removal of previously placed anterior instrumentation (C4-C6     Medtronic plate and 6 screws). 6. Use of morselized allograft - ViviGen.  SURGEON:  Phylliss Bob, M.D.  ASSISTANT:  Pricilla Holm, PA-C.  ANESTHESIA:  General endotracheal anesthesia.  COMPLICATIONS:  None.  DISPOSITION:  Stable.  ESTIMATED BLOOD LOSS:  Minimal.  INDICATIONS FOR SURGERY:  Briefly, Mr. Beri is a pleasant 76 year old male who did present to me with symptoms consistent with progressive cervical myelopathy as well as bilateral cervical radiculopathy.  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  The first stage being the procedure outlined above, to be followed  by a posterior C3-C7 fusion on the following day.  The patient did elect to proceed.  DESCRIPTION OF PROCEDURE:  On January 27, 2017, the patient was brought to surgery and general endotracheal anesthesia was administered.  The patient was placed supine on the hospital bed.  The neck was not extended, given his severe spinal cord compression and myelomalacia. The patient was very carefully intubated with no extension of the neck. The arms were secured to the sides.  The neck was prepped and draped in the usual sterile fashion and a time-out procedure was performed. Antibiotics were given.  A right-sided transverse incision was then made.  A Smith-Robinson approach was utilized and the anterior spine was noted.  Upon identification of the C3-4 intervertebral space, it was readily obvious that the anterior plate was very high and did approximate the C3-4 intervertebral space.  I did feel that removal of the plate was needed, in order to optimally provide fixation anteriorly. I then exposed the upper and lower aspects of the plate.  All 6 screws that were previously placed into the C4, C5, and C6 vertebral bodies were removed, as was the anterior plate.  A bone wax was placed in the cannulated vertebral body screw holes.  I then placed a self-retaining retractor, centered over the C3-4 intervertebral space.  Eda Keys  pins were placed into the C3 and C4 vertebral bodies and gentle distraction was applied.  I then proceeded with a thorough and complete C3-4 intervertebral diskectomy.  There were very prominent and very large intervertebral disk herniations, clearly compressing the spinal canal. These were removed uneventfully.  A bilateral neuroforaminal decompression was also performed.  There were additional fragments of disk adherent to the vertebral body above and below which were partially removed.  I did feel that the spinal canal was adequately decompressed, despite the fact that there  was adherent disk material both above and below the C3-4 intervertebral space, however, I did not feel that the additional risk associated with trying to remove these small adherent fragments would outweigh the risk associated with removing them.  The endplates were then prepared.  Of note, the C3 lower endplate was very concave, and I did fashion the C3-4 endplate to be parallel.  After placing a series of trials, I did select a 9 mm parallel intervertebral implant which was packed with ViviGen.  The implant was tamped into position under lateral fluoroscopy.  I was very pleased with the final resting position of the implant.  I then used an awl to prepare the vertebral body screw holes through the anterior plate.  16 mm screws were placed, 1 in the C3 vertebral body, and 1 in the C3 and the C4 vertebral bodies.  I was very pleased with the press-fit and purchase of the screws.  The screws were then locked to the plate per the manufacture's recommendations.  The wound was then copiously irrigated. I was very pleased with the final fluoroscopic images.  There was no undue bleeding at the termination of the procedure.  The wound was then closed in layers using 2-0 Vicryl followed by 4-0 Monocryl.  Benzoin and Steri-Strips were applied followed by sterile dressing.  All instrument counts were correct at the termination of the procedure.  Of note, Pricilla Holm was my assistant throughout surgery, and did aid in retraction, suctioning, and closure from start to finish.     Phylliss Bob, MD   ______________________________ Phylliss Bob, MD    MD/MEDQ  D:  01/27/2017  T:  01/27/2017  Job:  [id number]

## 2017-01-28 NOTE — Anesthesia Procedure Notes (Addendum)
Procedure Name: Intubation Date/Time: 01/28/2017 7:47 AM Performed by: Candis Shine Pre-anesthesia Checklist: Patient identified, Emergency Drugs available, Suction available and Patient being monitored Patient Re-evaluated:Patient Re-evaluated prior to inductionOxygen Delivery Method: Circle System Utilized Preoxygenation: Pre-oxygenation with 100% oxygen Intubation Type: IV induction and Rapid sequence Laryngoscope Size: Glidescope and 4 Grade View: Grade I Tube type: Subglottic suction tube Tube size: 7.5 mm Number of attempts: 1 Airway Equipment and Method: Stylet Placement Confirmation: ETT inserted through vocal cords under direct vision,  positive ETCO2 and breath sounds checked- equal and bilateral Secured at: 24 cm Tube secured with: Tape Dental Injury: Teeth and Oropharynx as per pre-operative assessment

## 2017-01-29 ENCOUNTER — Encounter (HOSPITAL_COMMUNITY): Payer: Self-pay | Admitting: Orthopedic Surgery

## 2017-01-29 DIAGNOSIS — M5412 Radiculopathy, cervical region: Secondary | ICD-10-CM

## 2017-01-29 LAB — CBC
HEMATOCRIT: 32.5 % — AB (ref 39.0–52.0)
Hemoglobin: 10.2 g/dL — ABNORMAL LOW (ref 13.0–17.0)
MCH: 24 pg — AB (ref 26.0–34.0)
MCHC: 31.4 g/dL (ref 30.0–36.0)
MCV: 76.5 fL — AB (ref 78.0–100.0)
Platelets: 301 10*3/uL (ref 150–400)
RBC: 4.25 MIL/uL (ref 4.22–5.81)
RDW: 17.1 % — AB (ref 11.5–15.5)
WBC: 15.3 10*3/uL — AB (ref 4.0–10.5)

## 2017-01-29 LAB — POCT I-STAT 4, (NA,K, GLUC, HGB,HCT)
GLUCOSE: 122 mg/dL — AB (ref 65–99)
Glucose, Bld: 133 mg/dL — ABNORMAL HIGH (ref 65–99)
HCT: 26 % — ABNORMAL LOW (ref 39.0–52.0)
HCT: 30 % — ABNORMAL LOW (ref 39.0–52.0)
HEMOGLOBIN: 8.8 g/dL — AB (ref 13.0–17.0)
HEMOGLOBIN: 10.2 g/dL — AB (ref 13.0–17.0)
POTASSIUM: 3.7 mmol/L (ref 3.5–5.1)
Potassium: 3.9 mmol/L (ref 3.5–5.1)
SODIUM: 138 mmol/L (ref 135–145)
Sodium: 139 mmol/L (ref 135–145)

## 2017-01-29 LAB — BASIC METABOLIC PANEL
Anion gap: 10 (ref 5–15)
BUN: 18 mg/dL (ref 6–20)
CHLORIDE: 102 mmol/L (ref 101–111)
CO2: 25 mmol/L (ref 22–32)
Calcium: 8.8 mg/dL — ABNORMAL LOW (ref 8.9–10.3)
Creatinine, Ser: 0.86 mg/dL (ref 0.61–1.24)
GFR calc Af Amer: >60 mL/min (ref >60)
GFR calc non Af Amer: >60 mL/min (ref >60)
GLUCOSE: 148 mg/dL — AB (ref 65–99)
POTASSIUM: 3.9 mmol/L (ref 3.5–5.1)
SODIUM: 137 mmol/L (ref 135–145)

## 2017-01-29 LAB — GLUCOSE, CAPILLARY
GLUCOSE-CAPILLARY: 160 mg/dL — AB (ref 65–99)
Glucose-Capillary: 125 mg/dL — ABNORMAL HIGH (ref 65–99)
Glucose-Capillary: 104 mg/dL — ABNORMAL HIGH (ref 65–99)
Glucose-Capillary: 121 mg/dL — ABNORMAL HIGH (ref 65–99)
Glucose-Capillary: 135 mg/dL — ABNORMAL HIGH (ref 65–99)

## 2017-01-29 MED ORDER — ACETAMINOPHEN 325 MG PO TABS
650.0000 mg | ORAL_TABLET | ORAL | Status: DC | PRN
  Administered 2017-01-30 – 2017-01-31 (×2): 650 mg via ORAL
  Filled 2017-01-29 (×2): qty 2

## 2017-01-29 MED ORDER — ACETAMINOPHEN 650 MG RE SUPP: 650.0000 mg | RECTAL | Status: DC | PRN

## 2017-01-29 MED ORDER — OXYCODONE HCL 5 MG PO TABS
5.0000 mg | ORAL_TABLET | Freq: Four times a day (QID) | ORAL | Status: DC | PRN
  Administered 2017-01-29 – 2017-01-31 (×7): 5 mg via ORAL
  Filled 2017-01-29 (×7): qty 1

## 2017-01-29 MED FILL — Thrombin For Soln 20000 Unit: CUTANEOUS | Qty: 1 | Status: AC

## 2017-01-29 NOTE — Progress Notes (Signed)
OT Cancellation Note  Patient Details Name: Russell [name] MRN: [id number] DOB: 1941-04-18   Cancelled Treatment:    Reason Eval/Treat Not Completed: Patient not medically ready (currently intubated with plans for extubation this afternoon). RN requesting therapy hold until pt extubated. Will follow up for OT eval as time allows.  Binnie Kand M.S., OTR/L Pager: [phone]  01/29/2017, 9:24 AM

## 2017-01-29 NOTE — Progress Notes (Signed)
Changed aspen collar. Pts vitals stable. Will continue to monitor.

## 2017-01-29 NOTE — Progress Notes (Signed)
    Patient doing well, PO Day 2/1 S/P Anterior/posterior cervical fusion. Anesthesia maintained pt intubation overnight as concerned regarding maintaining secretions and airway after staged 2 day procedure. Pt Remains intubated this morning, he is awake and alert and appears agitated by tube, pt eager to have tube removed, denies arm pain, yes neck pain but well controlled. CC Team planning on extubation today     Physical Exam: BP (!) 158/65   Pulse 60   Temp 98.4 F (36.9 C) (Oral)   Resp 18   Wt 78.5 kg (173 lb)   SpO2 98%   BMI 22.21 kg/m   Dressing in place, collar in place, pt intubated but alert and responds to questions with head shake, appears agitated by tube.  NVI  POD #1/2  3-4 ACD/ C3-7 PSF, resolved arm symtomatology, well controlled neck pain, agitated by ongoing intubation   - Concern for airway edema, anesthesia maintained intubation overnight, plan to extubate today   -D/C to [facility] post extubation  - encourage ambulation upon extubation  - Percocet for pain, Valium for muscle spasms - likely d/c home sat vs sun pending extubation and progress

## 2017-01-29 NOTE — Care Management Note (Signed)
Case Management Note  Patient Details  Name: Russell [name] MRN: [id number] Date of Birth: 1941-02-18  Subjective/Objective:   Pt is s/p cervical discectomy and fusion                 Action/Plan:  Pt is alert and oriented on the ventilator.  Pt confirmed he is from home with wife and independent.  CM will continue to follow for discharge planning.   Expected Discharge Date:                  Expected Discharge Plan:     In-House Referral:     Discharge planning Services  CM Consult  Post Acute Care Choice:    Choice offered to:     DME Arranged:    DME Agency:     HH Arranged:    HH Agency:     Status of Service:  In process, will continue to follow  If discussed at Long Length of Stay Meetings, dates discussed:    Additional Comments: 01/29/2017 Plan is for extubation possibly today Maryclare Labrador, RN 01/29/2017, 9:20 AM

## 2017-01-29 NOTE — Progress Notes (Signed)
PULMONARY / CRITICAL CARE MEDICINE   Name: NEFI MELROSE MRN: [id number] DOB: [birth year]    ADMISSION DATE:  01/27/2017 CONSULTATION DATE:  01/28/17  REFERRING MD:  Dr. Lynann Bologna   CHIEF COMPLAINT:    BRIEF SUMMARY:  76 y/o M admitted 2/7 for planned two stage cervical decompression and fusion of cervical spine.  Remained intubated post-op posterior portion of fusion due to concerns for airway edema.     SUBJECTIVE:  RT reports pt weaning on PSV 5/5, 30% without difficulty.  + cuff leak.  No acute events overnight.   VITAL SIGNS: BP (!) 161/99   Pulse (!) 56   Temp 98.8 F (37.1 C) (Oral)   Resp 17   Wt 173 lb (78.5 kg)   SpO2 100%   BMI 22.21 kg/m   HEMODYNAMICS:    VENTILATOR SETTINGS: Vent Mode: PSV;CPAP FiO2 (%):  [40 %-60 %] 40 % Set Rate:  [16 bmp] 16 bmp Vt Set:  [530 mL] 530 mL PEEP:  [5 cmH20] 5 cmH20 Pressure Support:  [5 cmH20] 5 cmH20 Plateau Pressure:  [15 cmH20-17 cmH20] 15 cmH20  INTAKE / OUTPUT: I/O last 3 completed shifts: In: 2659.1 [I.V.:2044.1; Blood:325; NG/GT:90; IV [id number] Out: [id number] [Urine:2875; Emesis/NG output:100; Blood:100]  PHYSICAL EXAMINATION: General:  Elderly male in NAD HEENT: MM pink/moist, ETT, C-Collar in place Neuro: awake, alert, follows commands, MAE CV: s1s2 rrr, no m/r/g PULM: even/non-labored, lungs bilaterally clear  [id number], non-tender, bsx4 active  Extremities: warm/dry, no edema  Skin: no rashes or lesions   LABS:  BMET  Recent Labs Lab 01/28/17 0843 01/28/17 1033 01/29/17 0405  NA 139 138 137  K 3.7 3.9 3.9  CL  --   --  102  CO2  --   --  25  BUN  --   --  18  CREATININE  --   --  0.86  GLUCOSE 122* 133* 148*    Electrolytes  Recent Labs Lab 01/29/17 0405  CALCIUM 8.8*    CBC  Recent Labs Lab 01/28/17 1033 01/28/17 1540 01/29/17 0405  WBC  --  11.0* 15.3*  HGB 10.2* 10.3* 10.2*  HCT 30.0* 33.0* 32.5*  PLT  --  306 301    Coag's No results for input(s): APTT, INR in  the last 168 hours.  Sepsis Markers No results for input(s): LATICACIDVEN, PROCALCITON, O2SATVEN in the last 168 hours.  ABG  Recent Labs Lab 01/28/17 1230 01/28/17 1628  PHART 7.353 7.364  PCO2ART 47.4 47.2  PO2ART 159* 134.0*    Liver Enzymes No results for input(s): AST, ALT, ALKPHOS, BILITOT, ALBUMIN in the last 168 hours.  Cardiac Enzymes No results for input(s): TROPONINI, PROBNP in the last 168 hours.  Glucose  Recent Labs Lab 01/28/17 1137 01/28/17 1627 01/28/17 1913 01/28/17 2342 01/29/17 0349 01/29/17 0849  GLUCAP 126* 153* 113* 110* 125* 104*    Imaging Dg Cervical Spine 2 Or 3 Views  Result Date: 01/28/2017 CLINICAL DATA:  Posterior cervical spine fusion EXAM: CERVICAL SPINE - 2-3 VIEW COMPARISON:  Two intraoperative images are submitted and are compared to prior CT cervical spine 10/23/2016 FINDINGS: Image #1 at [id number] hours: Metallic probe via posterior approach overlies the spinous process of C4. Prior C4-C6 anterior fusion. Anterior hardware post fusion at C3-C4. Degenerative disc disease changes C6-C7. Image #2 at 1032 hours: Anterior fusion C3-C4 again seen. BILATERAL pedicle screws and posterior bars are seen from posterior C3-C7 fusion. Pedicle screws are present at C3, C4, C5 and C7. Alignment stable.  IMPRESSION: Intraoperative images during posterior fusion of C3-C7. Electronically Signed   By: Lavonia Dana M.D.   On: 01/28/2017 11:14   Dg Chest Port 1 View  Result Date: 01/28/2017 CLINICAL DATA:  ETT placement; to remain intubated post cervical surgery EXAM: PORTABLE CHEST 1 VIEW COMPARISON:  01/21/2017 FINDINGS: Endotracheal tube has been placed, tip approximately 4.8 cm above the carina. A nasogastric tube is in place with tip beyond the gastroesophageal junction and off the film. Heart size is normal. There is minimal bibasilar atelectasis. Suspect mild interstitial edema. IMPRESSION: 1. Suspect mild edema and bibasilar atelectasis. 2. Endotracheal tube  and nasogastric tube as described. Electronically Signed   By: Nolon Nations M.D.   On: 01/28/2017 14:16     STUDIES:    CULTURES:   ANTIBIOTICS: Ancef 2/8 (post-op)   SIGNIFICANT EVENTS: 2/7 Admit for 2 stage cervical decompression   LINES/TUBES: ETT 2/8 >> 2/9  DISCUSSION: 76 y/o M admitted 2/7 for two stage cervical decompression.  Anterior fusion 2/7 with some difficulty swallowing/manageing secretions post-op, returned 2/8 for posterior.  Decision made not to extubate post-operatively.    ASSESSMENT / PLAN:  PULMONARY A: Acute Respiratory Insufficiency - post operative decompression / fusion  Hx COPD P:   Wean for extubation 2/9 am  Pulmonary hygiene - IS, mobilize as able  Follow intermittent CXR  Duoneb PRN  Continue decadron x 24 more hours then d/c, end time in place  CARDIOVASCULAR A:  Hx CAD, HTN, HLD P:  Continue cozaar, pravachol ICU monitoring of hemodynamics  RENAL A:   Hx BPH P:   NS with 30 mEq KCL at 10 ml/hr Continue ditropan, flomax Follow BMP/UOP  GASTROINTESTINAL A:   Hx GERD P:   PPI for SUP  NPO  Will need SLP evaluation given extensive surgery and risk for upper airway swelling  HEMATOLOGIC A:   No Acute Issues - EBL from OR ~100 ml, received 1 unit PRBC intra-op P:  Trend CBC  SCD's for DVT prophylaxis  Defer timing of pharmacologic anticoagulation to Dr. Lynann Bologna   INFECTIOUS A:   Post-Op Anterior / Posterior Cervical Fusion  P:   Monitor fever curve / WBC  ENDOCRINE A:   Hyperglycemia    P:   SSI while on steroids  NEUROLOGIC A:   Post-operative Pain  P:   D/C all sedation  Maintain C-Collar     FAMILY  - Updates: Patient updated on plan of care.  No family at bedside.     - Inter-disciplinary family meet or Palliative Care meeting due by:  2/15  NP CC Time: 42 minutes  Noe Gens, NP-C [facility] Pgr: [phone] or if no answer [phone] 01/29/2017, 9:34 AM    Attending Note:  I have examined patient, reviewed labs, studies and notes. I have discussed the case with B Ollis, and I agree with the data and plans as amended above. 76 year old man with a history of cervical spine / disc disease who underwent a 2-stage cervical decompression and fixation on 2/8. There was some concern for poor airway protection and poor secretion management, upper airway edema, and he was left intubated postoperatively. On evaluation today he is awake, calm, has clear breath sounds. He's on pressure support and is tolerating well. I believe he meets criteria for extubation and we will proceed with extubation today as long as he has a good cuff leak. Independent critical care time is 35 minutes.   Baltazar Apo, MD, PhD 01/29/2017,  2:52 PM [facility] [phone] or if no answer [phone]

## 2017-01-29 NOTE — Progress Notes (Signed)
    Just spoke with floor nurse, pt was uneventfully extubated earlier today, has been tolerating liquids and PO pills well, managing secretions well, alert and oriented, pain well controlled, it is felt pt is stable for transfer and to trial progressing diet as tolerated in controlled fashion. Placed orders for transfer to [facility] and gradual diet progression as tolerated.

## 2017-01-29 NOTE — Procedures (Signed)
Extubation Procedure Note  Patient Details:   Name: Russell [name] DOB: 1941/06/11 MRN: [id number]   Airway Documentation:     Evaluation  O2 sats: stable throughout Complications: No apparent complications Patient did tolerate procedure well. Bilateral Breath Sounds: Clear   Yes  Patient tolerated wean. Order to extubate. Positive for cuff leak. Patient extubated to a 3 Lpm. No signs of dyspnea or stridor. Patient instructed on the Incentive Spirometer achieving 760mL three times. Patient resting comfortably. RN at bedside.   [name] 01/29/2017, 9:45 AM

## 2017-01-29 NOTE — Progress Notes (Signed)
Spoke with Dr. Despina Pole in [place], per MD okay to removed arterial line. Will continue to monitor.

## 2017-01-30 LAB — BASIC METABOLIC PANEL
Anion gap: 15 (ref 5–15)
BUN: 24 mg/dL — ABNORMAL HIGH (ref 6–20)
CALCIUM: 9.3 mg/dL (ref 8.9–10.3)
CO2: 26 mmol/L (ref 22–32)
CREATININE: 0.88 mg/dL (ref 0.61–1.24)
Chloride: 102 mmol/L (ref 101–111)
GFR calc non Af Amer: >60 mL/min (ref >60)
GLUCOSE: 134 mg/dL — AB (ref 65–99)
Potassium: 5.2 mmol/L — ABNORMAL HIGH (ref 3.5–5.1)
Sodium: 143 mmol/L (ref 135–145)

## 2017-01-30 LAB — GLUCOSE, CAPILLARY
GLUCOSE-CAPILLARY: 134 mg/dL — AB (ref 65–99)
GLUCOSE-CAPILLARY: 146 mg/dL — AB (ref 65–99)
Glucose-Capillary: 132 mg/dL — ABNORMAL HIGH (ref 65–99)
Glucose-Capillary: 124 mg/dL — ABNORMAL HIGH (ref 65–99)
Glucose-Capillary: 158 mg/dL — ABNORMAL HIGH (ref 65–99)
Glucose-Capillary: 122 mg/dL — ABNORMAL HIGH (ref 65–99)

## 2017-01-30 LAB — CBC
HCT: 33 % — ABNORMAL LOW (ref 39.0–52.0)
Hemoglobin: 10.6 g/dL — ABNORMAL LOW (ref 13.0–17.0)
MCH: 24.7 pg — AB (ref 26.0–34.0)
MCHC: 32.1 g/dL (ref 30.0–36.0)
MCV: 76.7 fL — ABNORMAL LOW (ref 78.0–100.0)
PLATELETS: 322 10*3/uL (ref 150–400)
RBC: 4.3 MIL/uL (ref 4.22–5.81)
RDW: 17.4 % — ABNORMAL HIGH (ref 11.5–15.5)
WBC: 19.7 10*3/uL — ABNORMAL HIGH (ref 4.0–10.5)

## 2017-01-30 NOTE — Progress Notes (Signed)
Subjective: 2 Days Post-Op Procedure(s) (LRB): POSTERIOR SPINAL FUSION CERVICAL 3-4, CERVICAL 4-5, CERVICAL 5-6, CERVICAL 6-7 WITH INSTRUMENATION AND ALLOGRAFT (N/A) Patient reports pain as moderate. Taking fluids by mouth without difficulty. Still has Foley catheter in place. Has moderate neck pain with very mild soreness in his left upper arm.   Objective: Vital signs in last 24 hours: Temp:  [97.6 F (36.4 C)-98.9 F (37.2 C)] 98.9 F (37.2 C) (02/10 0958) Pulse Rate:  [40-77] 77 (02/10 0958) Resp:  [9-22] 20 (02/10 0958) BP: (124-175)/(52-78) 124/67 (02/10 0958) SpO2:  [95 %-100 %] 95 % (02/10 0958) Arterial Line BP: (142-169)/(56-66) 151/58 (02/09 1500) Weight:  [78.2 kg (172 lb 7.6 oz)] 78.2 kg (172 lb 7.6 oz) (02/09 2300)  Intake/Output from previous day: 02/09 0701 - 02/10 0700 In: 470 [P.O.:360; I.V.:110] Out: 650 [Urine:600; Emesis/NG output:50] Intake/Output this shift: Total I/O In: 600 [P.O.:600] Out: -    Recent Labs  01/28/17 0843 01/28/17 1033 01/28/17 1540 01/29/17 0405 01/30/17 0538  HGB 8.8* 10.2* 10.3* 10.2* 10.6*    Recent Labs  01/29/17 0405 01/30/17 0538  WBC 15.3* 19.7*  RBC 4.25 4.30  HCT 32.5* 33.0*  PLT 301 322    Recent Labs  01/29/17 0405 01/30/17 0538  NA 137 143  K 3.9 5.2*  CL 102 102  CO2 25 26  BUN 18 24*  CREATININE 0.86 0.88  GLUCOSE 148* 134*  CALCIUM 8.8* 9.3   No results for input(s): LABPT, INR in the last 72 hours. Cervical spine exam:Cervical collar is intact.  Dressings are clean and dry.  Upper extremities are without weakness.  Normal sensation.  Good grip strength.   Assessment/Plan: 2 Days Post-Op Procedure(s) (LRB): POSTERIOR SPINAL FUSION CERVICAL 3-4, CERVICAL 4-5, CERVICAL 5-6, CERVICAL 6-7 WITH INSTRUMENATION AND ALLOGRAFT (N/A)  Plan: Discontinue Foley catheter. Up with physical therapy. Continue cervical collar. Plan on discharge home tomorrow.  Alisse Tuite G 01/30/2017, 10:53 AM

## 2017-01-30 NOTE — Progress Notes (Signed)
Voided w/out problem-foley d/c'd earlier.  Up to bathroom w/minimal assist.

## 2017-01-30 NOTE — Evaluation (Signed)
Occupational Therapy Evaluation Patient Details Name: Russell [name] MRN: [id number] DOB: September 10, 1941 Today's Date: 01/30/2017    History of Present Illness Pt is a 76 y.o. male s/p C3-4 ACDF, C3-7 posterior spinal fusion. PMHx: Arthritis, COPD, Depression, Galucoma, Hx of seizure, HTN, Mitral regurgitation, PAD, Prostate cancer, DM 2.   Clinical Impression   Pt reports he was independent with BADL PTA. Currently pt supervision for bed mobility with poor technique despite education. Pt refusing to participate in ADL or functional mobility at this time due to pain. Educated pt on cervical precautions and home safety. Pt planning to d/c home with 24/7 supervision from family. Pt would benefit from continued skilled OT to address established goals.    Follow Up Recommendations  No OT follow up;Supervision/Assistance - 24 hour    Equipment Recommendations  None recommended by OT    Recommendations for Other Services       Precautions / Restrictions Precautions Precautions: Cervical;Fall Precaution Comments: Educated pt on cervical precautions and provided handout Required Braces or Orthoses: Cervical Brace Cervical Brace: Hard collar;At all times ([place] collar for showers) Restrictions Weight Bearing Restrictions: No      Mobility Bed Mobility Overal bed mobility: Needs Assistance Bed Mobility: Supine to Sit;Sit to Supine     Supine to sit: Supervision Sit to supine: Supervision   General bed mobility comments: Pt not following therapists instructions for log roll technique despite education. Supervision to come into long sitting then EOB.  Transfers                 General transfer comment: Pt declining to perform.    Balance Overall balance assessment: Needs assistance Sitting-balance support: Feet supported;No upper extremity supported Sitting balance-Leahy Scale: Good                                      ADL Overall ADL's : Needs  assistance/impaired Eating/Feeding: Set up;Sitting   Grooming: Supervision/safety;Set up;Sitting   Upper Body Bathing: Set up;Supervision/ safety;Sitting       Upper Body Dressing : Set up;Supervision/safety;Sitting                     General ADL Comments: Pt refusing to perform functional mobility this session; beginning to get agitated. Able to scoot sideways at EOB for bed pad placement. Refusing to participate in ADL.     Vision     Perception     Praxis      Pertinent Vitals/Pain Pain Assessment: 0-10 Pain Score: 10-Worst pain ever Pain Location: neck, L arm Pain Descriptors / Indicators: Aching;Sore Pain Intervention(s): Monitored during session     Hand Dominance Right   Extremity/Trunk Assessment Upper Extremity Assessment Upper Extremity Assessment: Overall WFL for tasks assessed   Lower Extremity Assessment Lower Extremity Assessment: LLE deficits/detail LLE Deficits / Details: numbness from knee down per pt; present PTA LLE Sensation: decreased light touch   Cervical / Trunk Assessment Cervical / Trunk Assessment: Other exceptions Cervical / Trunk Exceptions: s/p cervical sx   Communication Communication Communication: No difficulties   Cognition Arousal/Alertness: Awake/alert Behavior During Therapy: WFL for tasks assessed/performed Overall Cognitive Status: Within Functional Limits for tasks assessed                     General Comments       Exercises       Shoulder Instructions  Home Living Family/patient expects to be discharged to:: Private residence Living Arrangements: Spouse/significant other;Children Available Help at Discharge: Family;Available 24 hours/day Type of Home: House Home Access: Ramped entrance     Home Layout: One level     Bathroom Shower/Tub: Occupational psychologist: Handicapped height     Home Equipment: Other (comment);Grab bars - tub/shower (hover round, walking stick)           Prior Functioning/Environment Level of Independence: Independent with assistive device(s)        Comments: hover round for mobility outside of home, occasional use of walking stick. Family does househould tasks, pt independent with BADL.        OT Problem List: Decreased strength;Decreased activity tolerance;Impaired balance (sitting and/or standing);Decreased safety awareness;Decreased knowledge of use of DME or AE;Decreased knowledge of precautions;Impaired sensation;Pain   OT Treatment/Interventions: Self-care/ADL training;Energy conservation;DME and/or AE instruction;Therapeutic exercise;Therapeutic activities;Patient/family education;Balance training    OT Goals(Current goals can be found in the care plan section) Acute Rehab OT Goals Patient Stated Goal: go home OT Goal Formulation: With patient Time For Goal Achievement: 02/13/17 Potential to Achieve Goals: Good ADL Goals Pt Will Perform Lower Body Bathing: with supervision;sit to/from stand Pt Will Perform Lower Body Dressing: with supervision;sit to/from stand Pt Will Transfer to Toilet: with supervision;ambulating;regular height toilet Pt Will Perform Toileting - Clothing Manipulation and hygiene: with supervision;sit to/from stand Additional ADL Goal #1: Pt will don/doff cervical collar with set up as precursor to ADL and functional mobility.  OT Frequency: Min 2X/week   Barriers to D/C:            Co-evaluation              End of Session Equipment Utilized During Treatment: Cervical collar  Activity Tolerance: Patient limited by pain Patient left: in bed;with call bell/phone within reach;with bed alarm set   Time: 1128-1140 OT Time Calculation (min): 12 min Charges:  OT General Charges $OT Visit: 1 Procedure OT Evaluation $OT Eval Moderate Complexity: 1 Procedure G-Codes:     Binnie Kand M.S., OTR/L Pager: [phone]  01/30/2017, 11:58 AM

## 2017-01-30 NOTE — Progress Notes (Signed)
PCCM signed off. Please reconsult if needed.  CD Annamaria Boots, MD

## 2017-01-30 NOTE — Progress Notes (Signed)
Pt arrived during shift change around 1900 from step down, due to post surgical issue requiring extended intubation. He is A/O X4 with surgical site pain and weakness in lower extremities bilaterally. Oriented him to staff room explained pain management plan and care plan to he and his wife, will continue to monitor and address pain medication requests during rounding.

## 2017-01-31 ENCOUNTER — Inpatient Hospital Stay (HOSPITAL_COMMUNITY): Payer: Medicare Other

## 2017-01-31 LAB — GLUCOSE, CAPILLARY
GLUCOSE-CAPILLARY: 110 mg/dL — AB (ref 65–99)
Glucose-Capillary: 112 mg/dL — ABNORMAL HIGH (ref 65–99)
Glucose-Capillary: 120 mg/dL — ABNORMAL HIGH (ref 65–99)
Glucose-Capillary: 94 mg/dL (ref 65–99)

## 2017-01-31 MED ORDER — DIAZEPAM 5 MG PO TABS: 5.0000 mg | ORAL_TABLET | Freq: Four times a day (QID) | ORAL | 0 refills | Status: DC | PRN

## 2017-01-31 MED ORDER — OXYCODONE-ACETAMINOPHEN 5-325 MG PO TABS: 1.0000 | ORAL_TABLET | ORAL | 0 refills | Status: DC | PRN

## 2017-01-31 NOTE — Evaluation (Signed)
Clinical/Bedside Swallow Evaluation Patient Details  Name: Russell [name] MRN: [id number] Date of Birth: 1941-10-19  Today's Date: 01/31/2017 Time: SLP Start Time (ACUTE ONLY): 1148 SLP Stop Time (ACUTE ONLY): 1156 SLP Time Calculation (min) (ACUTE ONLY): 8 min  Past Medical History:  Past Medical History:  Diagnosis Date  . Arthritis    SHOUDLERS, LUMBAR  . BPH with obstruction/lower urinary tract symptoms   . BPH with urinary obstruction 01/15/2016  . COPD (chronic obstructive pulmonary disease) ([place])   . Coronary atherosclerosis    denied knowledge of this 10/27/16  . Depression   . Diverticulosis of colon   . Dyspnea   . ED (erectile dysfunction)   . Full dentures   . GERD (gastroesophageal reflux disease)    not current  . Glaucoma   . History of gastritis   . History of scabies    2008  . 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 History of syncope    03-22-2014  DX  VAGAL RESPONSE  . Hyperlipidemia, mixed   . Hypertension   . Mitral regurgitation   . Peripheral arterial disease ([place])    one vessel runoff bilaterally via peroneal arteries  . Prostate cancer ([place])   . Seizures ([place]) 03/2014   ? or syncope- patient refused to be admitted for further studies- "felt better"  . Type 2 diabetes, diet controlled ([place])    patient and his wife said no- have not been told 01/21/17  . Wears glasses    Past Surgical History:  Past Surgical History:  Procedure Laterality Date  . ANTERIOR CERVICAL DECOMP/DISCECTOMY FUSION  10-16-2009   C4 -- C6  . ANTERIOR CERVICAL DECOMP/DISCECTOMY FUSION N/A 01/27/2017   Procedure: ANTERIOR CERVICAL DECOMPRESSION FUSION CERVICAL 3-4 WITH INSTRUMENTATION AND ALLOGRAFT;  Surgeon: Phylliss Bob, MD;  Location: [facility];  Service: Orthopedics;  Laterality: N/A;  ANTERIOR CERVICAL DECOMPRESSION FUSION CERVICAL 3-4 WITH INSTRUMENTATION AND ALLOGRAFT  . CAPSULOTOMY Right 01/26/2013   Procedure: MINOR CAPSULOTOMY;  Surgeon: Myrtha Mantis., MD;  Location: [place];  Service: Ophthalmology;  Laterality: Right;  . CARPECTOMY Right 03/13/2014   Procedure: RIGHT  PROXIMAL ROW CARPECTOMY;  Surgeon: Tennis Must, MD;  Location: [place];  Service: Orthopedics;  Laterality: Right;  . CARPECTOMY Left 10/22/2015   Procedure: LEFT WRIST PROXIMAL ROW CARPECTOMY ;  Surgeon: Leanora Cover, MD;  Location: [place];  Service: Orthopedics;  Laterality: Left;  . CARPOMETACARPAL ([place]) FUSION OF THUMB Right 03/13/2014   Procedure: RIGHT FUSION OF THUMB CARPOMETACARPAL [facility]) JOINT;  Surgeon: Tennis Must, MD;  Location: [place];  Service: Orthopedics;  Laterality: Right;  . CATARACT EXTRACTION W/ INTRAOCULAR LENS  IMPLANT, BILATERAL  2009  . COLONOSCOPY  08-31-2007  . COLONOSCOPY    . CRYOABLATION N/A 01/14/2015   Procedure: CRYO ABLATION PROSTATE;  Surgeon: Ailene Rud, MD;  Location: [facility];  Service: Urology;  Laterality: N/A;  . ESOPHAGOGASTRODUODENOSCOPY  last one 09-11-2008  . EXCISION MASS LEFT FACE , SUBORBITAL AREA, PLASTER RECONSTRUCTION  02-09-2011  . EXCISIONAL DEBRIDEMENT AND REPAIR RIGHT QUADRICEP TENDON  07-05-2000  . FOOT SURGERY Left   . I&D EXTREMITY Right 05/24/2013   Procedure: RIGHT INDEX WOUND DEBRIDEMENT AND CLOSURE;  Surgeon: Jolyn Nap, MD;  Location: [place];  Service: Orthopedics;  Laterality: Right;  . INSERTION OF SUPRAPUBIC CATHETER N/A 01/14/2015   Procedure: SUPRAPUBIC TUBE  PLACEMENT;  Surgeon: Ailene Rud, MD;  [facility];  Service: Urology;  Laterality: N/A;  . KNEE ARTHROSCOPY Right 2008  . LARYNGOSCOPY AND ESOPHAGOSCOPY  06-13-2010   MARSUPIALIZATION LEFT LARGE VALLECULAR CYST  . MASS EXCISION Left 10/22/2015   Procedure: EXCISION MASS OF LEFT INDEX FINGER;  Surgeon: Leanora Cover, MD;  Location: [place];  Service: Orthopedics;  Laterality: Left;  .  ORCHIECTOMY Left 02/24/2015   Procedure: SCROTAL EXPLORATION WITH LEFT ORCHIECTOMY;  Surgeon: Kathie Rhodes, MD;  Location: [place];  Service: Urology;  Laterality: Left;  . SHOULDER ARTHROSCOPY/ DEBRIDEMENT LABRAL TEAR/  BICEPS TENOTOMY Left 09-24-2011  . TONSILLECTOMY  as child  . TRANSTHORACIC ECHOCARDIOGRAM  02-21-2010   normal LVF/  ef 55-60%/ mild to moderate MR/  moderate LAE/  mild TR  . YAG LASER APPLICATION Right [id number]   Procedure: YAG LASER APPLICATION;  Surgeon: Myrtha Mantis., MD;  Location: [place];  Service: Ophthalmology;  Laterality: Right;  . YAG LASER CAPSULOTOMY, LEFT EYE  01-08-2011   HPI:  76 y/o M admitted 2/7 for planned two stage cervical decompression and fusion of cervical spine. Remained intubated post-op posterior portion of fusion due to concerns for airway edema. C3-4 ACDF, C3-7 posterior spinal fusion. PMHx: Arthritis, COPD, GERD, Depression, Galucoma, Hx of seizure, HTN, Mitral regurgitation, PAD, Prostate cancer, DM 2.   Assessment / Plan / Recommendation Clinical Impression  Patient presents with moderate risk for aspiration in the setting of s/p ACDF/PCDF, with immediate cough x3 noted with thin liquids and mechanical soft solids during consumption of lunch meal suggestive of decreased airway protection. Given history of esophageal dysphagia, recent cervical surgery and clinical signs of aspiration, recommend pt receive instrumental assessment to rule out aspiration, aid in determining least restrictive diet and identifying compensatory strategies and treatment techniques to improve oropharyngeal swallowing function. Pt has been scheduled for objective evaluation immediately as he is scheduled for imminent discharge.      Aspiration Risk  Moderate aspiration risk    Diet Recommendation Other (Comment) (TBD pending MBS)        Other  Recommendations     Follow up Recommendations        Frequency and Duration min 2x/week  2 weeks        Prognosis Prognosis for Safe Diet Advancement: Good      Swallow Study   General Date of Onset: 01/27/17 HPI: 76 y/o M admitted 2/7 for planned two stage cervical decompression and fusion of cervical spine. Remained intubated post-op posterior portion of fusion due to concerns for airway edema. C3-4 ACDF, C3-7 posterior spinal fusion. PMHx: Arthritis, COPD, GERD, Depression, Galucoma, Hx of seizure, HTN, Mitral regurgitation, PAD, Prostate cancer, DM 2. Type of Study: Bedside Swallow Evaluation Diet Prior to this Study: Regular;Thin liquids Temperature Spikes Noted: No Respiratory Status: Room air History of Recent Intubation: Yes Length of Intubations (days): 2 days Date extubated: 01/29/17 Behavior/Cognition: Alert;Cooperative Oral Cavity Assessment: Within Functional Limits Oral Care Completed by SLP: No Oral Cavity - Dentition: Dentures, bottom;Missing dentition Vision: Functional for self-feeding Self-Feeding Abilities: Able to feed self Patient Positioning: Upright in bed Baseline Vocal Quality: Normal Volitional Cough: Strong Volitional Swallow: Able to elicit    Oral/Motor/Sensory Function Overall Oral Motor/Sensory Function: Within functional limits   Ice Chips Ice chips: Not tested   Thin Liquid Thin Liquid: Impaired Presentation: Self Fed;Straw Pharyngeal  Phase Impairments: Wet Vocal Quality;Cough - Immediate    Nectar Thick Nectar Thick Liquid:  Not tested   Honey Thick Honey Thick Liquid: Not tested   Puree Puree: Not tested   Solid   GO   Solid: Impaired Presentation: Self Fed;Spoon Pharyngeal Phase Impairments: Throat Clearing - Delayed;Cough - Immediate        Aliene Altes 01/31/2017,12:13 PM  Deneise Lever, MS CF-SLP Speech-Language Pathologist [phone]

## 2017-01-31 NOTE — Progress Notes (Signed)
Subjective: 3 Days Post-Op Procedure(s) (LRB): POSTERIOR SPINAL FUSION CERVICAL 3-4, CERVICAL 4-5, CERVICAL 5-6, CERVICAL 6-7 WITH INSTRUMENATION AND ALLOGRAFT (N/A) Patient reports pain as moderate.  Patient is taking by mouth and voiding okay. He is getting up independently to the bathroom.  Objective: Vital signs in last 24 hours: Temp:  [98.2 F (36.8 C)-99 F (37.2 C)] 98.2 F (36.8 C) (02/11 0556) Pulse Rate:  [53-66] 53 (02/11 0556) Resp:  [20] 20 (02/11 0556) BP: (125-163)/(70-86) 153/77 (02/11 0556) SpO2:  [95 %-100 %] 95 % (02/11 0556)  Intake/Output from previous day: 02/10 0701 - 02/11 0700 In: 600 [P.O.:600] Out: -  Intake/Output this shift: No intake/output data recorded.   Recent Labs  01/28/17 1033 01/28/17 1540 01/29/17 0405 01/30/17 0538  HGB 10.2* 10.3* 10.2* 10.6*    Recent Labs  01/29/17 0405 01/30/17 0538  WBC 15.3* 19.7*  RBC 4.25 4.30  HCT 32.5* 33.0*  PLT 301 322    Recent Labs  01/29/17 0405 01/30/17 0538  NA 137 143  K 3.9 5.2*  CL 102 102  CO2 25 26  BUN 18 24*  CREATININE 0.86 0.88  GLUCOSE 148* 134*  CALCIUM 8.8* 9.3   No results for input(s): LABPT, INR in the last 72 hours. Cervical spine exam: [place] collar is intact. Dressings are clean and dry. Good motor strength in both upper extremities. He has some swelling in his left wrist where he has an arthritic wrist.   Assessment/Plan: 3 Days Post-Op Procedure(s) (LRB): POSTERIOR SPINAL FUSION CERVICAL 3-4, CERVICAL 4-5, CERVICAL 5-6, CERVICAL 6-7 WITH INSTRUMENATION AND ALLOGRAFT (N/A) Plan: Discharge home. Continue cervical collar at all times. Rx for Percocet 5 mg as needed for pain Rx for Valium 5 mg as needed for spasm. Follow-up with Dr. Lynann Bologna on February 21 at [facility] 01/31/2017, 10:23 AM

## 2017-01-31 NOTE — Progress Notes (Signed)
Pt discharge education and instructions completed with pt and family at bedside; all voices understanding and denies any questions. Pt IV removed; pt anterior incision remains clean, dry and intact with steri-strips. Posterior neck incision dsg remains clean, dry and intact. Neck collar remains on and aligned; pt handed his prescriptions for valium and percocet. Pt discharge home with family to transport him home. Pt transported off unit via wheelchair with belongings and family to the side. Francis Gaines Miciah Shealy RN.

## 2017-01-31 NOTE — Progress Notes (Signed)
Modified Barium Swallow Progress Note  Patient Details  Name: Russell [name] MRN: [id number] Date of Birth: 10/06/1941  Today's Date: 01/31/2017  Modified Barium Swallow completed.  Full report located under Chart Review in the Imaging Section.  Brief recommendations include the following:  Clinical Impression  Patient presents with oropharyngeal swallow which appears grossly within functional limits, and suspected primary esophageal dysphagia. No aspiration observed. Noted narrowing of the pharyngeal space which appears secondary to ACDF/PCDF and cervical edema, however no overt functional impacts observed during this assessment. Oral stage of swallowing is characterized by adequate rotary mastication, bolus manipulation and anterior to posterior transit. Pharyngeal stage with adequate tongue base retraction, hyolaryngeal excursion and pharyngeal constriction. Epiglottic movement, pharyngeal stripping complete and airway protection adequate. Patient has history of esophageal dysphagia and GERD; noted thin column of barium in cervical esophageal segment with intermittent backflow into the pharynx. Reversible penetration of thin liquids to the vocal cords x1 during multiple swallows when taken with barium pill. An esophageal sweep was subsequently performed. Observed standing column of barium in the esophagus with to and fro movement; no MD present to confirm. Subsequent view of the pharynx showed barium present in post-cricoid region and laryngeal vestibule above the level of the vocal cords. Patient with protective reflexive throat clear, reswallows with verbal cue. Provided patient with verbal and written information regarding suspected primary esophageal dysphagia. Recommend regular diet with thin liquids. For esophageal symptoms, recommend follow-up with GI and strategies including alternating solids and liquids, upright posture during and after PO intake should this aid symptoms.     Swallow  Evaluation Recommendations   Recommended Consults: Consider GI evaluation   SLP Diet Recommendations: Thin liquid;Regular solids   Liquid Administration via: Cup;Straw   Medication Administration: Whole meds with liquid   Supervision: Patient able to self feed   Compensations: Slow rate;Small sips/bites;Follow solids with liquid   Postural Changes: Remain semi-upright after after feeds/meals (Comment);Seated upright at 90 degrees   Oral Care Recommendations: Oral care BID     Deneise Lever, [place] CF-SLP Speech-Language Pathologist [phone]   Aliene Altes 01/31/2017,2:32 PM

## 2017-01-31 NOTE — Progress Notes (Signed)
Occupational Therapy Treatment Patient Details Name: Russell [name] MRN: [id number] DOB: 11/10/1941 Today's Date: 01/31/2017    History of present illness Pt is a 76 y.o. male s/p C3-4 ACDF, C3-7 posterior spinal fusion. PMHx: Arthritis, COPD, Depression, Galucoma, Hx of seizure, HTN, Mitral regurgitation, PAD, Prostate cancer, DM 2.   OT comments  Pt making progress towards OT goals this session. This session focused on safety and maintaining precautions during functional transfers and educating family with don/doff of Cervical collar and precautions. Pt and family with no questions or concerns about ADL going home. OT stressed importance of collar and maintaining precautions (using worksheet as reference).  Follow Up Recommendations  No OT follow up;Supervision/Assistance - 24 hour    Equipment Recommendations  None recommended by OT    Recommendations for Other Services      Precautions / Restrictions Precautions Precautions: Cervical;Fall Precaution Comments: Educated pt on cervical precautions and reviewed handout Required Braces or Orthoses: Cervical Brace Cervical Brace: Hard collar;At all times [facility] for showers) Restrictions Weight Bearing Restrictions: No       Mobility Bed Mobility               General bed mobility comments: Pt sitting EOB when OT entered, educated Pt and family on rolling technique for precautions and WHY they are important.   Transfers Overall transfer level: Needs assistance Equipment used: None Transfers: Sit to/from Stand Sit to Stand: Min guard         General transfer comment: vc for sequencing and technique    Balance Overall balance assessment: Needs assistance Sitting-balance support: Feet supported;No upper extremity supported Sitting balance-Leahy Scale: Normal Sitting balance - Comments: sitting EOB with no back support   Standing balance support: No upper extremity supported;During functional  activity Standing balance-Leahy Scale: Good                     ADL Overall ADL's : Needs assistance/impaired     Grooming: Supervision/safety;Standing;Wash/dry hands Grooming Details (indicate cue type and reason): sink level grooming         Upper Body Dressing : Min guard;Standing Upper Body Dressing Details (indicate cue type and reason): to don hospital gown like robe     Toilet Transfer: Min guard;Cueing for sequencing;Comfort height toilet Toilet Transfer Details (indicate cue type and reason): Pt educated it will decrease pain if he reaches back with hands first to slow descent onto surface (toilet or chair)         Functional mobility during ADLs: Supervision/safety General ADL Comments: Pt will have wife and daughter at home with him to assist      Vision                     Perception     Praxis      Cognition   Behavior During Therapy: WFL for tasks assessed/performed Overall Cognitive Status: Within Functional Limits for tasks assessed                       Extremity/Trunk Assessment               Exercises     Shoulder Instructions       General Comments      Pertinent Vitals/ Pain       Pain Assessment: 0-10 Pain Score: 9  Faces Pain Scale: No hurt Pain Location: neck, L arm Pain Descriptors / Indicators: Aching;Sore Pain Intervention(s): Monitored during  session;Limited activity within patient's tolerance  Home Living                                          Prior Functioning/Environment              Frequency  Min 2X/week        Progress Toward Goals  OT Goals(current goals can now be found in the care plan section)  Progress towards OT goals: Progressing toward goals  Acute Rehab OT Goals Patient Stated Goal: go home OT Goal Formulation: With patient Time For Goal Achievement: 02/13/17 Potential to Achieve Goals: Good  Plan Discharge plan remains appropriate     Co-evaluation                 End of Session Equipment Utilized During Treatment: Cervical collar   Activity Tolerance Patient tolerated treatment well   Patient Left with call bell/phone within reach;with family/visitor present (sitting EOB)   Nurse Communication Mobility status;Other (comment) (waiting on transport)        Time: [id number] OT Time Calculation (min): 13 min  Charges: OT General Charges $OT Visit: 1 Procedure OT Treatments $Self Care/Home Management : 8-22 mins  [name] [name] 01/31/2017, 12:58 PM [name] OTR/L [phone]

## 2017-02-01 LAB — TYPE AND SCREEN
ABO/RH(D): O POS
ANTIBODY SCREEN: NEGATIVE
Unit division: 0
Unit division: 0

## 2017-02-04 ENCOUNTER — Encounter (HOSPITAL_COMMUNITY): Payer: Self-pay | Admitting: Orthopedic Surgery

## 2017-02-23 NOTE — Discharge Summary (Signed)
Patient ID: Russell [name] MRN: [id number] DOB/AGE: [birth year] 75 y.o.  Admit date: 01/27/2017 Discharge date:   Admission Diagnoses:  Active Problems:   Radiculopathy   Cervical radiculopathy   Acute respiratory insufficiency   Surgery, elective   Discharge Diagnoses:  Same  Past Medical History:  Diagnosis Date  . Arthritis    SHOUDLERS, LUMBAR  . BPH with obstruction/lower urinary tract symptoms   . BPH with urinary obstruction 01/15/2016  . COPD (chronic obstructive pulmonary disease) ([place])   . Coronary atherosclerosis    denied knowledge of this 10/27/16  . Depression   . Diverticulosis of colon   . Dyspnea   . ED (erectile dysfunction)   . Full dentures   . GERD (gastroesophageal reflux disease)    not current  . Glaucoma   . History of gastritis   . History of scabies    2008  . 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 History of syncope    03-22-2014  DX  VAGAL RESPONSE  . Hyperlipidemia, mixed   . Hypertension   . Mitral regurgitation   . Peripheral arterial disease ([place])    one vessel runoff bilaterally via peroneal arteries  . Prostate cancer ([place])   . Seizures ([place]) 03/2014   ? or syncope- patient refused to be admitted for further studies- "felt better"  . Type 2 diabetes, diet controlled ([place])    patient and his wife said no- have not been told 01/21/17  . Wears glasses     Surgeries: Procedure(s): POSTERIOR SPINAL FUSION CERVICAL 3-4, CERVICAL 4-5, CERVICAL 5-6, CERVICAL 6-7 WITH INSTRUMENATION AND ALLOGRAFT on 01/27/2017 - 01/28/2017   Consultants:   Discharged Condition: Improved  Hospital Course: Russell [name] is an 76 y.o. male who was admitted 01/27/2017 for operative treatment of <principal problem not specified>. Patient has severe unremitting pain that affects sleep, daily activities, and work/hobbies. After pre-op clearance the patient was taken to the operating room on 01/27/2017 - 01/28/2017 and underwent   Procedure(s): POSTERIOR SPINAL FUSION CERVICAL 3-4, CERVICAL 4-5, CERVICAL 5-6, CERVICAL 6-7 WITH INSTRUMENATION AND ALLOGRAFT.    Patient was given perioperative antibiotics:  Anti-infectives    Start     Dose/Rate Route Frequency Ordered Stop   01/28/17 1600  ceFAZolin (ANCEF) IVPB 2g/100 mL premix     2 g 200 mL/hr over 30 Minutes Intravenous Every 8 hours 01/28/17 1537 01/29/17 0030   01/27/17 1500  ceFAZolin (ANCEF) IVPB 2g/100 mL premix     2 g 200 mL/hr over 30 Minutes Intravenous Every 8 hours 01/27/17 1417 01/27/17 2329   01/27/17 0643  ceFAZolin (ANCEF) IVPB 2g/100 mL premix  Status:  Discontinued     2 g 200 mL/hr over 30 Minutes Intravenous On call to O.R. 01/27/17 [id number] 01/27/17 1404   01/27/17 [id number]  ceFAZolin (ANCEF) IVPB 2g/100 mL premix     2 g 200 mL/hr over 30 Minutes Intravenous On call to O.R. 01/27/17 [id number] 01/27/17 0857       Patient was given sequential compression devices, early ambulation to prevent DVT.  Patient benefited maximally from hospital stay and there were no complications.    Recent vital signs: No data found.    Discharge Medications:   Allergies as of 01/31/2017      Reactions   No Known Allergies       Medication List    TAKE these medications   albuterol 108 (90 Base)  MCG/ACT inhaler Commonly known as:  PROVENTIL HFA;VENTOLIN HFA Inhale 2 puffs into the lungs every 6 (six) hours as needed for wheezing or shortness of breath.   AZOPT 1 % ophthalmic suspension Generic drug:  brinzolamide Place 1 drop into the left eye 3 (three) times daily.   bimatoprost 0.01 % Soln Commonly known as:  LUMIGAN Place 1 drop into both eyes at bedtime.   CENTRUM SILVER ADULT 50+ PO Take 1 tablet by mouth daily.   COMBIGAN 0.2-0.5 % ophthalmic solution Generic drug:  brimonidine-timolol Place 1 drop into both eyes 2 (two) times daily.   diazepam 5 MG tablet Commonly known as:  VALIUM Take 1 tablet (5 mg total) by mouth every 6 (six) hours as  needed for muscle spasms.   gabapentin 300 MG capsule Commonly known as:  NEURONTIN Take 1 capsule by mouth 2 (two) times daily.   HYDROcodone-acetaminophen 7.5-325 MG tablet Commonly known as:  NORCO Take 1 tablet by mouth every 8 (eight) hours as needed for moderate pain. pain   losartan 100 MG tablet Commonly known as:  COZAAR Take 1 tablet by mouth daily.   oxybutynin 5 MG tablet Commonly known as:  DITROPAN TAKE 1 TABLET EVERY 12 HOURS   oxyCODONE-acetaminophen 5-325 MG tablet Commonly known as:  PERCOCET/ROXICET Take 1 tablet by mouth every 4 (four) hours as needed for severe pain.   pravastatin 80 MG tablet Commonly known as:  PRAVACHOL Take 1 tablet by mouth every evening.   tamsulosin 0.4 MG Caps capsule Commonly known as:  FLOMAX Take 0.4 mg by mouth at bedtime.       Diagnostic Studies: Dg Cervical Spine 2 Or 3 Views  Result Date: 01/28/2017 CLINICAL DATA:  Posterior cervical spine fusion EXAM: CERVICAL SPINE - 2-3 VIEW COMPARISON:  Two intraoperative images are submitted and are compared to prior CT cervical spine 10/23/2016 FINDINGS: Image #1 at [id number] hours: Metallic probe via posterior approach overlies the spinous process of C4. Prior C4-C6 anterior fusion. Anterior hardware post fusion at C3-C4. Degenerative disc disease changes C6-C7. Image #2 at [id number] hours: Anterior fusion C3-C4 again seen. BILATERAL pedicle screws and posterior bars are seen from posterior C3-C7 fusion. Pedicle screws are present at C3, C4, C5 and C7. Alignment stable. IMPRESSION: Intraoperative images during posterior fusion of C3-C7. Electronically Signed   By: Lavonia Dana M.D.   On: 01/28/2017 11:14   Dg Cervical Spine 2-3 Views  Result Date: 01/27/2017 CLINICAL DATA:  ACDF CT 3 EXAM: CERVICAL SPINE - 2-3 VIEW; DG C-ARM 61-120 MIN COMPARISON:  None. FLUOROSCOPY TIME:  12 seconds FINDINGS: Interval anterior cervical disc fusion at C2-3 without failure or complication. Anatomic alignment.  IMPRESSION: Interval ACDF at C2-3. Electronically Signed   By: Kathreen Devoid   On: 01/27/2017 11:46   Dg Chest Port 1 View  Result Date: 01/28/2017 CLINICAL DATA:  ETT placement; to remain intubated post cervical surgery EXAM: PORTABLE CHEST 1 VIEW COMPARISON:  01/21/2017 FINDINGS: Endotracheal tube has been placed, tip approximately 4.8 cm above the carina. A nasogastric tube is in place with tip beyond the gastroesophageal junction and off the film. Heart size is normal. There is minimal bibasilar atelectasis. Suspect mild interstitial edema. IMPRESSION: 1. Suspect mild edema and bibasilar atelectasis. 2. Endotracheal tube and nasogastric tube as described. Electronically Signed   By: Nolon Nations M.D.   On: 01/28/2017 14:16   Dg Swallowing Func-speech Pathology  Result Date: 01/31/2017 Objective Swallowing Evaluation: Type of Study: MBS-Modified Barium Swallow Study Patient  Details Name: Russell [name] MRN: [id number] Date of Birth: 22-May-1941 Today's Date: 01/31/2017 Time: SLP Start Time (ACUTE ONLY): 1355-SLP Stop Time (ACUTE ONLY): 1415 SLP Time Calculation (min) (ACUTE ONLY): 20 min Past Medical History: Past Medical History: Diagnosis Date . Arthritis   SHOUDLERS, LUMBAR . BPH with obstruction/lower urinary tract symptoms  . BPH with urinary obstruction 01/15/2016 . COPD (chronic obstructive pulmonary disease) ([place])  . Coronary atherosclerosis   denied knowledge of this 10/27/16 . Depression  . Diverticulosis of colon  . Dyspnea  . ED (erectile dysfunction)  . Full dentures  . GERD (gastroesophageal reflux disease)   not current . Glaucoma  . History of gastritis  . History of scabies   2008 . History of seizure   2013-- x2   idiopathic --  none since . History of syncope   03-22-2014  DX  VAGAL RESPONSE . Hyperlipidemia, mixed  . Hypertension  . Mitral regurgitation  . Peripheral arterial disease ([place])   one vessel runoff bilaterally via peroneal arteries . Prostate cancer ([place])  . Seizures ([place])  03/2014  ? or syncope- patient refused to be admitted for further studies- "felt better" . Type 2 diabetes, diet controlled ([place])   patient and his wife said no- have not been told 01/21/17 . Wears glasses  Past Surgical History: Past Surgical History: Procedure Laterality Date . ANTERIOR CERVICAL DECOMP/DISCECTOMY FUSION  10-16-2009  C4 -- C6 . ANTERIOR CERVICAL DECOMP/DISCECTOMY FUSION N/A 01/27/2017  Procedure: ANTERIOR CERVICAL DECOMPRESSION FUSION CERVICAL 3-4 WITH INSTRUMENTATION AND ALLOGRAFT;  Surgeon: Phylliss Bob, MD;  Location: [facility];  Service: Orthopedics;  Laterality: N/A;  ANTERIOR CERVICAL DECOMPRESSION FUSION CERVICAL 3-4 WITH INSTRUMENTATION AND ALLOGRAFT . CAPSULOTOMY Right 01/26/2013  Procedure: MINOR CAPSULOTOMY;  Surgeon: Myrtha Mantis., MD;  Location: [facility];  Service: Ophthalmology;  Laterality: Right; . CARPECTOMY Right 03/13/2014  Procedure: RIGHT  PROXIMAL ROW CARPECTOMY;  Surgeon: Tennis Must, MD;  Location: [place];  Service: Orthopedics;  Laterality: Right; . CARPECTOMY Left 10/22/2015  Procedure: LEFT WRIST PROXIMAL ROW CARPECTOMY ;  Surgeon: Leanora Cover, MD;  Location: [facility];  Service: Orthopedics;  Laterality: Left; . CARPOMETACARPAL ([place]) FUSION OF THUMB Right 03/13/2014  Procedure: RIGHT FUSION OF THUMB CARPOMETACARPAL [facility]) JOINT;  Surgeon: Tennis Must, MD;  Location: [place];  Service: Orthopedics;  Laterality: Right; . CATARACT EXTRACTION W/ INTRAOCULAR LENS  IMPLANT, BILATERAL  2009 . COLONOSCOPY  08-31-2007 . COLONOSCOPY   . CRYOABLATION N/A 01/14/2015  Procedure: CRYO ABLATION PROSTATE;  Surgeon: Ailene Rud, MD;  Location: [facility];  Service: Urology;  Laterality: N/A; . ESOPHAGOGASTRODUODENOSCOPY  last one 09-11-2008 . EXCISION MASS LEFT FACE , SUBORBITAL AREA, PLASTER RECONSTRUCTION  02-09-2011 . EXCISIONAL DEBRIDEMENT AND REPAIR RIGHT QUADRICEP TENDON  07-05-2000 . FOOT SURGERY Left  .  I&D EXTREMITY Right 05/24/2013  Procedure: RIGHT INDEX WOUND DEBRIDEMENT AND CLOSURE;  Surgeon: Jolyn Nap, MD;  Location: [place];  Service: Orthopedics;  Laterality: Right; . INSERTION OF SUPRAPUBIC CATHETER N/A 01/14/2015  Procedure: SUPRAPUBIC TUBE PLACEMENT;  Surgeon: Ailene Rud, MD;  Location: [facility];  Service: Urology;  Laterality: N/A; . KNEE ARTHROSCOPY Right 2008 . LARYNGOSCOPY AND ESOPHAGOSCOPY  06-13-2010  MARSUPIALIZATION LEFT LARGE VALLECULAR CYST . MASS EXCISION Left 10/22/2015  Procedure: EXCISION MASS OF LEFT INDEX FINGER;  Surgeon: Leanora Cover, MD;  Location: [place];  Service: Orthopedics;  Laterality: Left; . ORCHIECTOMY Left 02/24/2015  Procedure: SCROTAL EXPLORATION WITH LEFT ORCHIECTOMY;  Surgeon: Kathie Rhodes, MD;  Location: [place];  Service: Urology;  Laterality: Left; . SHOULDER ARTHROSCOPY/ DEBRIDEMENT LABRAL TEAR/  BICEPS TENOTOMY Left 09-24-2011 . TONSILLECTOMY  as child . TRANSTHORACIC ECHOCARDIOGRAM  02-21-2010  normal LVF/  ef 55-60%/ mild to moderate MR/  moderate LAE/  mild TR . YAG LASER APPLICATION Right [id number]  Procedure: YAG LASER APPLICATION;  Surgeon: Myrtha Mantis., MD;  Location: [place];  Service: Ophthalmology;  Laterality: Right; . YAG LASER CAPSULOTOMY, LEFT EYE  01-08-2011 HPI: 76 y/o M admitted 2/7 for planned two stage cervical decompression and fusion of cervical spine. Remained intubated post-op posterior portion of fusion due to concerns for airway edema. C3-4 ACDF, C3-7 posterior spinal fusion. PMHx: Arthritis, COPD, GERD, Depression, Galucoma, Hx of seizure, HTN, Mitral regurgitation, PAD, Prostate cancer, DM 2. No Data Recorded Assessment / Plan / Recommendation CHL IP CLINICAL IMPRESSIONS 01/31/2017 Therapy Diagnosis WFL;Suspected primary esophageal dysphagia Clinical Impression Patient presents with oropharyngeal swallow which appears grossly within functional limits, and suspected  primary esophageal dysphagia. No aspiration observed. Noted narrowing of the pharyngeal space which appears secondary to ACDF/PCDF and cervical edema, however no overt functional impacts observed during this assessment. Oral stage of swallowing is characterized by adequate rotary mastication, bolus manipulation and anterior to posterior transit. Pharyngeal stage with adequate tongue base retraction, hyolaryngeal excursion and pharyngeal constriction. Epiglottic movement, pharyngeal stripping complete and airway protection adequate. Patient has history of esophageal dysphagia and GERD; noted thin column of barium in cervical esophageal segment with intermittent backflow into the pharynx. Reversible penetration of thin liquids to the vocal cords x1 during multiple swallows when taken with barium pill. An esophageal sweep was subsequently performed. Observed standing column of barium in the esophagus with to and fro movement; no MD present to confirm. Subsequent view of the pharynx showed barium present in post-cricoid region and laryngeal vestibule above the level of the vocal cords. Patient with protective reflexive throat clear, reswallows with verbal cue. Provided patient with verbal and written information regarding suspected primary esophageal dysphagia. Recommend regular diet with thin liquids. For esophageal symptoms, recommend follow-up with GI and strategies including alternating solids and liquids, upright posture during and after PO intake should this aid symptoms.   Impact on safety and function Mild aspiration risk   CHL IP TREATMENT RECOMMENDATION 01/31/2017 Treatment Recommendations F/U MBS in --- days (Comment);Defer until completion of intrumental exam   Prognosis 01/31/2017 Prognosis for Safe Diet Advancement Good Barriers to Reach Goals -- Barriers/Prognosis Comment -- CHL IP DIET RECOMMENDATION 01/31/2017 SLP Diet Recommendations Thin liquid;Regular solids Liquid Administration via Cup;Straw  Medication Administration Whole meds with liquid Compensations Slow rate;Small sips/bites;Follow solids with liquid Postural Changes Remain semi-upright after after feeds/meals (Comment);Seated upright at 90 degrees   CHL IP OTHER RECOMMENDATIONS 01/31/2017 Recommended Consults Consider GI evaluation Oral Care Recommendations Oral care BID Other Recommendations --   No flowsheet data found.  CHL IP FREQUENCY AND DURATION 01/31/2017 Speech Therapy Frequency (ACUTE ONLY) Other (Comment) Treatment Duration --      CHL IP ORAL PHASE 01/31/2017 Oral Phase WFL Oral - Pudding Teaspoon -- Oral - Pudding Cup -- Oral - Honey Teaspoon -- Oral - Honey Cup -- Oral - Nectar Teaspoon -- Oral - Nectar Cup -- Oral - Nectar Straw -- Oral - Thin Teaspoon -- Oral - Thin Cup -- Oral - Thin Straw -- Oral - Puree -- Oral - Mech Soft -- Oral - Regular -- Oral - Multi-Consistency --  Oral - Pill -- Oral Phase - Comment --  CHL IP PHARYNGEAL PHASE 01/31/2017 Pharyngeal Phase WFL Pharyngeal- Pudding Teaspoon -- Pharyngeal -- Pharyngeal- Pudding Cup -- Pharyngeal -- Pharyngeal- Honey Teaspoon -- Pharyngeal -- Pharyngeal- Honey Cup -- Pharyngeal -- Pharyngeal- Nectar Teaspoon -- Pharyngeal -- Pharyngeal- Nectar Cup -- Pharyngeal -- Pharyngeal- Nectar Straw -- Pharyngeal -- Pharyngeal- Thin Teaspoon -- Pharyngeal -- Pharyngeal- Thin Cup -- Pharyngeal -- Pharyngeal- Thin Straw -- Pharyngeal -- Pharyngeal- Puree -- Pharyngeal -- Pharyngeal- Mechanical Soft -- Pharyngeal -- Pharyngeal- Regular -- Pharyngeal -- Pharyngeal- Multi-consistency -- Pharyngeal -- Pharyngeal- Pill -- Pharyngeal -- Pharyngeal Comment --  CHL IP CERVICAL ESOPHAGEAL PHASE 01/31/2017 Cervical Esophageal Phase WFL Pudding Teaspoon -- Pudding Cup -- Honey Teaspoon -- Honey Cup -- Nectar Teaspoon -- Nectar Cup -- Nectar Straw -- Thin Teaspoon -- Thin Cup -- Thin Straw -- Puree -- Mechanical Soft -- Regular -- Multi-consistency -- Pill -- Cervical Esophageal Comment -- No flowsheet data  found. Aliene Altes 01/31/2017, 2:33 PM Deneise Lever, MS CF-SLP Speech-Language Pathologist [phone]             Dg C-arm 1-60 Min  Result Date: 01/27/2017 CLINICAL DATA:  ACDF CT 3 EXAM: CERVICAL SPINE - 2-3 VIEW; DG C-ARM 61-120 MIN COMPARISON:  None. FLUOROSCOPY TIME:  12 seconds FINDINGS: Interval anterior cervical disc fusion at C2-3 without failure or complication. Anatomic alignment. IMPRESSION: Interval ACDF at C2-3. Electronically Signed   By: Kathreen Devoid   On: 01/27/2017 11:46    Disposition: 01-Home or Self Care  Discharge Instructions    Call MD / Call 911    Complete by:  As directed    If you experience chest pain or shortness of breath, CALL 911 and be transported to the hospital emergency room.  If you develope a fever above 101 F, pus (white drainage) or increased drainage or redness at the wound, or calf pain, call your surgeon's office.   Constipation Prevention    Complete by:  As directed    Drink plenty of fluids.  Prune juice may be helpful.  You may use a stool softener, such as Colace (over the counter) 100 mg twice a day.  Use MiraLax (over the counter) for constipation as needed.   Diet Carb Modified    Complete by:  As directed    Increase activity slowly as tolerated    Complete by:  As directed    Wear cervical collar at all times      -Written scripts for pain signed and in chart -D/C instructions sheet printed and in chart -D/C today  -F/U in office 2 weeks   Signed: Justice Britain 02/23/2017, 5:11 PM         Patient ID: Russell [name] MRN: [id number] DOB/AGE: 1941/07/29 76 y.o.  Admit date: 01/27/2017 Discharge date: 01/31/2017  Admission Diagnoses:  Active Problems:   Radiculopathy   Cervical radiculopathy   Acute respiratory insufficiency   Surgery, elective   Discharge Diagnoses:  Same  Past Medical History:  Diagnosis Date  . Arthritis    SHOUDLERS, LUMBAR  . BPH with obstruction/lower urinary tract symptoms   .  BPH with urinary obstruction 01/15/2016  . COPD (chronic obstructive pulmonary disease) ([place])   . Coronary atherosclerosis    denied knowledge of this 10/27/16  . Depression   . Diverticulosis of colon   . Dyspnea   . ED (erectile dysfunction)   . Full dentures   . GERD (gastroesophageal reflux disease)  not current  . Glaucoma   . History of gastritis   . History of scabies    2008  . 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 History of syncope    03-22-2014  DX  VAGAL RESPONSE  . Hyperlipidemia, mixed   . Hypertension   . Mitral regurgitation   . Peripheral arterial disease ([place])    one vessel runoff bilaterally via peroneal arteries  . Prostate cancer ([place])   . Seizures ([place]) 03/2014   ? or syncope- patient refused to be admitted for further studies- "felt better"  . Type 2 diabetes, diet controlled ([place])    patient and his wife said no- have not been told 01/21/17  . Wears glasses     Surgeries: Procedure(s): POSTERIOR SPINAL FUSION CERVICAL 3-4, CERVICAL 4-5, CERVICAL 5-6, CERVICAL 6-7 WITH INSTRUMENATION AND ALLOGRAFT on 01/27/2017 - 01/28/2017   Consultants: None  Discharged Condition: Improved  Hospital Course: Russell [name] is an 76 y.o. male who was admitted 01/27/2017 for operative treatment of myeloradiculopathy. Patient has severe unremitting pain that affects sleep, daily activities, and work/hobbies. After pre-op clearance the patient was taken to the operating room on 01/27/2017 - 01/28/2017 and underwent  Procedure(s): POSTERIOR SPINAL FUSION CERVICAL 3-4, CERVICAL 4-5, CERVICAL 5-6, CERVICAL 6-7 WITH INSTRUMENATION AND ALLOGRAFT.    Patient was given perioperative antibiotics:  Anti-infectives    Start     Dose/Rate Route Frequency Ordered Stop   01/28/17 1600  ceFAZolin (ANCEF) IVPB 2g/100 mL premix     2 g 200 mL/hr over 30 Minutes Intravenous Every 8 hours 01/28/17 1537 01/29/17 0030   01/27/17 1500  ceFAZolin (ANCEF) IVPB 2g/100 mL  premix     2 g 200 mL/hr over 30 Minutes Intravenous Every 8 hours 01/27/17 1417 01/27/17 2329   01/27/17 0643  ceFAZolin (ANCEF) IVPB 2g/100 mL premix  Status:  Discontinued     2 g 200 mL/hr over 30 Minutes Intravenous On call to O.R. 01/27/17 [id number] 01/27/17 1404   01/27/17 [id number]  ceFAZolin (ANCEF) IVPB 2g/100 mL premix     2 g 200 mL/hr over 30 Minutes Intravenous On call to O.R. 01/27/17 [id number] 01/27/17 0857       Patient was given sequential compression devices, early ambulation to prevent DVT.  Patient benefited maximally from hospital stay and there were no complications. Intubation was maintained overnight per anesthesia over their concern for airway edema, pt was extubated next day without difficulty.     Recent vital signs: BP 131/77 (BP Location: Left Arm)   Pulse (!) 45   Temp 98.6 F (37 C) (Oral)   Resp 20   Ht 6\' 2"  (1.88 m)   Wt 78.2 kg (172 lb 7.6 oz)   SpO2 98%   BMI 22.14 kg/m    Discharge Medications:   Allergies as of 01/31/2017      Reactions   No Known Allergies       Medication List    TAKE these medications   albuterol 108 (90 Base) MCG/ACT inhaler Commonly known as:  PROVENTIL HFA;VENTOLIN HFA Inhale 2 puffs into the lungs every 6 (six) hours as needed for wheezing or shortness of breath.   AZOPT 1 % ophthalmic suspension Generic drug:  brinzolamide Place 1 drop into the left eye 3 (three) times daily.   bimatoprost 0.01 % Soln Commonly known as:  LUMIGAN Place 1 drop into both eyes at bedtime.   CENTRUM SILVER  ADULT 50+ PO Take 1 tablet by mouth daily.   COMBIGAN 0.2-0.5 % ophthalmic solution Generic drug:  brimonidine-timolol Place 1 drop into both eyes 2 (two) times daily.   diazepam 5 MG tablet Commonly known as:  VALIUM Take 1 tablet (5 mg total) by mouth every 6 (six) hours as needed for muscle spasms.   gabapentin 300 MG capsule Commonly known as:  NEURONTIN Take 1 capsule by mouth 2 (two) times daily.     HYDROcodone-acetaminophen 7.5-325 MG tablet Commonly known as:  NORCO Take 1 tablet by mouth every 8 (eight) hours as needed for moderate pain. pain   losartan 100 MG tablet Commonly known as:  COZAAR Take 1 tablet by mouth daily.   oxybutynin 5 MG tablet Commonly known as:  DITROPAN TAKE 1 TABLET EVERY 12 HOURS   oxyCODONE-acetaminophen 5-325 MG tablet Commonly known as:  PERCOCET/ROXICET Take 1 tablet by mouth every 4 (four) hours as needed for severe pain.   pravastatin 80 MG tablet Commonly known as:  PRAVACHOL Take 1 tablet by mouth every evening.   tamsulosin 0.4 MG Caps capsule Commonly known as:  FLOMAX Take 0.4 mg by mouth at bedtime.       Diagnostic Studies: Dg Cervical Spine 2 Or 3 Views  Result Date: 01/28/2017 CLINICAL DATA:  Posterior cervical spine fusion EXAM: CERVICAL SPINE - 2-3 VIEW COMPARISON:  Two intraoperative images are submitted and are compared to prior CT cervical spine 10/23/2016 FINDINGS: Image #1 at [id number] hours: Metallic probe via posterior approach overlies the spinous process of C4. Prior C4-C6 anterior fusion. Anterior hardware post fusion at C3-C4. Degenerative disc disease changes C6-C7. Image #2 at [id number] hours: Anterior fusion C3-C4 again seen. BILATERAL pedicle screws and posterior bars are seen from posterior C3-C7 fusion. Pedicle screws are present at C3, C4, C5 and C7. Alignment stable. IMPRESSION: Intraoperative images during posterior fusion of C3-C7. Electronically Signed   By: Lavonia Dana M.D.   On: 01/28/2017 11:14   Dg Cervical Spine 2-3 Views  Result Date: 01/27/2017 CLINICAL DATA:  ACDF CT 3 EXAM: CERVICAL SPINE - 2-3 VIEW; DG C-ARM 61-120 MIN COMPARISON:  None. FLUOROSCOPY TIME:  12 seconds FINDINGS: Interval anterior cervical disc fusion at C2-3 without failure or complication. Anatomic alignment. IMPRESSION: Interval ACDF at C2-3. Electronically Signed   By: Kathreen Devoid   On: 01/27/2017 11:46   Dg Chest Port 1 View  Result Date:  01/28/2017 CLINICAL DATA:  ETT placement; to remain intubated post cervical surgery EXAM: PORTABLE CHEST 1 VIEW COMPARISON:  01/21/2017 FINDINGS: Endotracheal tube has been placed, tip approximately 4.8 cm above the carina. A nasogastric tube is in place with tip beyond the gastroesophageal junction and off the film. Heart size is normal. There is minimal bibasilar atelectasis. Suspect mild interstitial edema. IMPRESSION: 1. Suspect mild edema and bibasilar atelectasis. 2. Endotracheal tube and nasogastric tube as described. Electronically Signed   By: Nolon Nations M.D.   On: 01/28/2017 14:16   Dg Swallowing Func-speech Pathology  Result Date: 01/31/2017 Objective Swallowing Evaluation: Type of Study: MBS-Modified Barium Swallow Study Patient Details Name: Russell [name] MRN: [id number] Date of Birth: 03-13-1941 Today's Date: 01/31/2017 Time: SLP Start Time (ACUTE ONLY): 1355-SLP Stop Time (ACUTE ONLY): 1415 SLP Time Calculation (min) (ACUTE ONLY): 20 min Past Medical History: Past Medical History: Diagnosis Date . Arthritis   SHOUDLERS, LUMBAR . BPH with obstruction/lower urinary tract symptoms  . BPH with urinary obstruction 01/15/2016 . COPD (chronic obstructive pulmonary disease) ([place])  .  Coronary atherosclerosis   denied knowledge of this 10/27/16 . Depression  . Diverticulosis of colon  . Dyspnea  . ED (erectile dysfunction)  . Full dentures  . GERD (gastroesophageal reflux disease)   not current . Glaucoma  . History of gastritis  . History of scabies   2008 . History of seizure   2013-- x2   idiopathic --  none since . History of syncope   03-22-2014  DX  VAGAL RESPONSE . Hyperlipidemia, mixed  . Hypertension  . Mitral regurgitation  . Peripheral arterial disease ([place])   one vessel runoff bilaterally via peroneal arteries . Prostate cancer ([place])  . Seizures ([place]) 03/2014  ? or syncope- patient refused to be admitted for further studies- "felt better" . Type 2 diabetes, diet controlled ([place])   patient  and his wife said no- have not been told 01/21/17 . Wears glasses  Past Surgical History: Past Surgical History: Procedure Laterality Date . ANTERIOR CERVICAL DECOMP/DISCECTOMY FUSION  10-16-2009  C4 -- C6 . ANTERIOR CERVICAL DECOMP/DISCECTOMY FUSION N/A 01/27/2017  Procedure: ANTERIOR CERVICAL DECOMPRESSION FUSION CERVICAL 3-4 WITH INSTRUMENTATION AND ALLOGRAFT;  Surgeon: Phylliss Bob, MD;  Location: [place];  Service: Orthopedics;  Laterality: N/A;  ANTERIOR CERVICAL DECOMPRESSION FUSION CERVICAL 3-4 WITH INSTRUMENTATION AND ALLOGRAFT . CAPSULOTOMY Right 01/26/2013  Procedure: MINOR CAPSULOTOMY;  Surgeon: Myrtha Mantis., MD;  Location: [place];  Service: Ophthalmology;  Laterality: Right; . CARPECTOMY Right 03/13/2014  Procedure: RIGHT  PROXIMAL ROW CARPECTOMY;  Surgeon: Tennis Must, MD;  Location: [place];  Service: Orthopedics;  Laterality: Right; . CARPECTOMY Left 10/22/2015  Procedure: LEFT WRIST PROXIMAL ROW CARPECTOMY ;  Surgeon: Leanora Cover, MD;  Location: [place];  Service: Orthopedics;  Laterality: Left; . CARPOMETACARPAL ([place]) FUSION OF THUMB Right 03/13/2014  Procedure: RIGHT FUSION OF THUMB CARPOMETACARPAL [facility]) JOINT;  Surgeon: Tennis Must, MD;  Location: [place];  Service: Orthopedics;  Laterality: Right; . CATARACT EXTRACTION W/ INTRAOCULAR LENS  IMPLANT, BILATERAL  2009 . COLONOSCOPY  08-31-2007 . COLONOSCOPY   . CRYOABLATION N/A 01/14/2015  Procedure: CRYO ABLATION PROSTATE;  Surgeon: Ailene Rud, MD;  Location: [facility];  Service: Urology;  Laterality: N/A; . ESOPHAGOGASTRODUODENOSCOPY  last one 09-11-2008 . EXCISION MASS LEFT FACE , SUBORBITAL AREA, PLASTER RECONSTRUCTION  02-09-2011 . EXCISIONAL DEBRIDEMENT AND REPAIR RIGHT QUADRICEP TENDON  07-05-2000 . FOOT SURGERY Left  . I&D EXTREMITY Right 05/24/2013  Procedure: RIGHT INDEX WOUND DEBRIDEMENT AND CLOSURE;  Surgeon: Jolyn Nap, MD;  Location: [place];  Service: Orthopedics;  Laterality: Right; . INSERTION OF SUPRAPUBIC CATHETER N/A 01/14/2015  Procedure: SUPRAPUBIC TUBE PLACEMENT;  Surgeon: Ailene Rud, MD;  Location: [facility];  Service: Urology;  Laterality: N/A; . KNEE ARTHROSCOPY Right 2008 . LARYNGOSCOPY AND ESOPHAGOSCOPY  06-13-2010  MARSUPIALIZATION LEFT LARGE VALLECULAR CYST . MASS EXCISION Left 10/22/2015  Procedure: EXCISION MASS OF LEFT INDEX FINGER;  Surgeon: Leanora Cover, MD;  Location: [place];  Service: Orthopedics;  Laterality: Left; . ORCHIECTOMY Left 02/24/2015  Procedure: SCROTAL EXPLORATION WITH LEFT ORCHIECTOMY;  Surgeon: Kathie Rhodes, MD;  Location: [place];  Service: Urology;  Laterality: Left; . SHOULDER ARTHROSCOPY/ DEBRIDEMENT LABRAL TEAR/  BICEPS TENOTOMY Left 09-24-2011 . TONSILLECTOMY  as child . TRANSTHORACIC ECHOCARDIOGRAM  02-21-2010  normal LVF/  ef 55-60%/ mild to moderate MR/  moderate LAE/  mild TR . YAG LASER APPLICATION Right [id number]  Procedure: YAG LASER APPLICATION;  Surgeon:  Myrtha Mantis., MD;  Location: [place];  Service: Ophthalmology;  Laterality: Right; . YAG LASER CAPSULOTOMY, LEFT EYE  01-08-2011 HPI: 76 y/o M admitted 2/7 for planned two stage cervical decompression and fusion of cervical spine. Remained intubated post-op posterior portion of fusion due to concerns for airway edema. C3-4 ACDF, C3-7 posterior spinal fusion. PMHx: Arthritis, COPD, GERD, Depression, Galucoma, Hx of seizure, HTN, Mitral regurgitation, PAD, Prostate cancer, DM 2. No Data Recorded Assessment / Plan / Recommendation CHL IP CLINICAL IMPRESSIONS 01/31/2017 Therapy Diagnosis WFL;Suspected primary esophageal dysphagia Clinical Impression Patient presents with oropharyngeal swallow which appears grossly within functional limits, and suspected primary esophageal dysphagia. No aspiration observed. Noted narrowing of the pharyngeal space which appears secondary to ACDF/PCDF and cervical  edema, however no overt functional impacts observed during this assessment. Oral stage of swallowing is characterized by adequate rotary mastication, bolus manipulation and anterior to posterior transit. Pharyngeal stage with adequate tongue base retraction, hyolaryngeal excursion and pharyngeal constriction. Epiglottic movement, pharyngeal stripping complete and airway protection adequate. Patient has history of esophageal dysphagia and GERD; noted thin column of barium in cervical esophageal segment with intermittent backflow into the pharynx. Reversible penetration of thin liquids to the vocal cords x1 during multiple swallows when taken with barium pill. An esophageal sweep was subsequently performed. Observed standing column of barium in the esophagus with to and fro movement; no MD present to confirm. Subsequent view of the pharynx showed barium present in post-cricoid region and laryngeal vestibule above the level of the vocal cords. Patient with protective reflexive throat clear, reswallows with verbal cue. Provided patient with verbal and written information regarding suspected primary esophageal dysphagia. Recommend regular diet with thin liquids. For esophageal symptoms, recommend follow-up with GI and strategies including alternating solids and liquids, upright posture during and after PO intake should this aid symptoms.   Impact on safety and function Mild aspiration risk   CHL IP TREATMENT RECOMMENDATION 01/31/2017 Treatment Recommendations F/U MBS in --- days (Comment);Defer until completion of intrumental exam   Prognosis 01/31/2017 Prognosis for Safe Diet Advancement Good Barriers to Reach Goals -- Barriers/Prognosis Comment -- CHL IP DIET RECOMMENDATION 01/31/2017 SLP Diet Recommendations Thin liquid;Regular solids Liquid Administration via Cup;Straw Medication Administration Whole meds with liquid Compensations Slow rate;Small sips/bites;Follow solids with liquid Postural Changes Remain semi-upright  after after feeds/meals (Comment);Seated upright at 90 degrees   CHL IP OTHER RECOMMENDATIONS 01/31/2017 Recommended Consults Consider GI evaluation Oral Care Recommendations Oral care BID Other Recommendations --   No flowsheet data found.  CHL IP FREQUENCY AND DURATION 01/31/2017 Speech Therapy Frequency (ACUTE ONLY) Other (Comment) Treatment Duration --      CHL IP ORAL PHASE 01/31/2017 Oral Phase WFL Oral - Pudding Teaspoon -- Oral - Pudding Cup -- Oral - Honey Teaspoon -- Oral - Honey Cup -- Oral - Nectar Teaspoon -- Oral - Nectar Cup -- Oral - Nectar Straw -- Oral - Thin Teaspoon -- Oral - Thin Cup -- Oral - Thin Straw -- Oral - Puree -- Oral - Mech Soft -- Oral - Regular -- Oral - Multi-Consistency -- Oral - Pill -- Oral Phase - Comment --  CHL IP PHARYNGEAL PHASE 01/31/2017 Pharyngeal Phase WFL Pharyngeal- Pudding Teaspoon -- Pharyngeal -- Pharyngeal- Pudding Cup -- Pharyngeal -- Pharyngeal- Honey Teaspoon -- Pharyngeal -- Pharyngeal- Honey Cup -- Pharyngeal -- Pharyngeal- Nectar Teaspoon -- Pharyngeal -- Pharyngeal- Nectar Cup -- Pharyngeal -- Pharyngeal- Nectar Straw -- Pharyngeal -- Pharyngeal- Thin Teaspoon -- Pharyngeal -- Pharyngeal- Thin Cup --  Pharyngeal -- Pharyngeal- Thin Straw -- Pharyngeal -- Pharyngeal- Puree -- Pharyngeal -- Pharyngeal- Mechanical Soft -- Pharyngeal -- Pharyngeal- Regular -- Pharyngeal -- Pharyngeal- Multi-consistency -- Pharyngeal -- Pharyngeal- Pill -- Pharyngeal -- Pharyngeal Comment --  CHL IP CERVICAL ESOPHAGEAL PHASE 01/31/2017 Cervical Esophageal Phase WFL Pudding Teaspoon -- Pudding Cup -- Honey Teaspoon -- Honey Cup -- Nectar Teaspoon -- Nectar Cup -- Nectar Straw -- Thin Teaspoon -- Thin Cup -- Thin Straw -- Puree -- Mechanical Soft -- Regular -- Multi-consistency -- Pill -- Cervical Esophageal Comment -- No flowsheet data found. Aliene Altes 01/31/2017, 2:33 PM Deneise Lever, MS CF-SLP Speech-Language Pathologist [phone]             Dg C-arm 1-60 Min  Result Date:  01/27/2017 CLINICAL DATA:  ACDF CT 3 EXAM: CERVICAL SPINE - 2-3 VIEW; DG C-ARM 61-120 MIN COMPARISON:  None. FLUOROSCOPY TIME:  12 seconds FINDINGS: Interval anterior cervical disc fusion at C2-3 without failure or complication. Anatomic alignment. IMPRESSION: Interval ACDF at C2-3. Electronically Signed   By: Kathreen Devoid   On: 01/27/2017 11:46    Disposition: 01-Home or Self Care  Discharge Instructions    Call MD / Call 911    Complete by:  As directed    If you experience chest pain or shortness of breath, CALL 911 and be transported to the hospital emergency room.  If you develope a fever above 101 F, pus (white drainage) or increased drainage or redness at the wound, or calf pain, call your surgeon's office.   Constipation Prevention    Complete by:  As directed    Drink plenty of fluids.  Prune juice may be helpful.  You may use a stool softener, such as Colace (over the counter) 100 mg twice a day.  Use MiraLax (over the counter) for constipation as needed.   Diet Carb Modified    Complete by:  As directed    Increase activity slowly as tolerated    Complete by:  As directed    Wear cervical collar at all times     POD #3/4 staged  3-4 ACD/ C3-7 PSF, resolved arm symtomatology, well controlled neck pain  - Concern for airway edema, anesthesia maintained intubation overnight and extubated next day without difficulty  - encourage ambulation  - Percocet for pain, Valium for muscle spasms -Written scripts for pain signed and in chart -D/C instructions sheet printed and in chart -D/C today  -F/U in office 2 weeks   Signed: Justice Britain 02/23/2017, 5:11 PM

## 2017-02-24 ENCOUNTER — Ambulatory Visit: Payer: Medicare Other | Attending: Internal Medicine | Admitting: Physical Therapy

## 2017-02-24 DIAGNOSIS — R2689 Other abnormalities of gait and mobility: Secondary | ICD-10-CM | POA: Insufficient documentation

## 2017-02-24 DIAGNOSIS — M79602 Pain in left arm: Secondary | ICD-10-CM | POA: Insufficient documentation

## 2017-02-24 DIAGNOSIS — M6281 Muscle weakness (generalized): Secondary | ICD-10-CM | POA: Insufficient documentation

## 2017-03-10 ENCOUNTER — Ambulatory Visit: Payer: Medicare Other | Admitting: Physical Therapy

## 2017-03-10 DIAGNOSIS — M6281 Muscle weakness (generalized): Secondary | ICD-10-CM

## 2017-03-10 DIAGNOSIS — R2689 Other abnormalities of gait and mobility: Secondary | ICD-10-CM | POA: Diagnosis present

## 2017-03-10 DIAGNOSIS — M79602 Pain in left arm: Secondary | ICD-10-CM

## 2017-03-10 NOTE — Therapy (Signed)
[facility] [address] [place], [place], [address] Phone: [phone]   Fax:  [phone]  Physical Therapy Evaluation  Patient Details  Name: Russell [name] MRN: [id number] Date of Birth: 05/16/1941 Referring Provider: Dr. Kevan Ny  Encounter Date: 03/10/2017      PT End of Session - 03/10/17 2029    Visit Number 1   Authorization Type [employer] Medicare   PT Start Time 1228   PT Stop Time 1314   PT Time Calculation (min) 46 min      Past Medical History:  Diagnosis Date  . Arthritis    SHOUDLERS, LUMBAR  . BPH with obstruction/lower urinary tract symptoms   . BPH with urinary obstruction 01/15/2016  . COPD (chronic obstructive pulmonary disease) ([place])   . Coronary atherosclerosis    denied knowledge of this 10/27/16  . Depression   . Diverticulosis of colon   . Dyspnea   . ED (erectile dysfunction)   . Full dentures   . GERD (gastroesophageal reflux disease)    not current  . Glaucoma   . History of gastritis   . History of scabies    2008  . 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 History of syncope    03-22-2014  DX  VAGAL RESPONSE  . Hyperlipidemia, mixed   . Hypertension   . Mitral regurgitation   . Peripheral arterial disease ([place])    one vessel runoff bilaterally via peroneal arteries  . Prostate cancer ([place])   . Seizures ([place]) 03/2014   ? or syncope- patient refused to be admitted for further studies- "felt better"  . Type 2 diabetes, diet controlled ([place])    patient and his wife said no- have not been told 01/21/17  . Wears glasses     Past Surgical History:  Procedure Laterality Date  . ANTERIOR CERVICAL DECOMP/DISCECTOMY FUSION  10-16-2009   C4 -- C6  . ANTERIOR CERVICAL DECOMP/DISCECTOMY FUSION N/A 01/27/2017   Procedure: ANTERIOR CERVICAL DECOMPRESSION FUSION CERVICAL 3-4 WITH INSTRUMENTATION AND ALLOGRAFT;  Surgeon: Phylliss Bob, MD;  Location: [facility];   Service: Orthopedics;  Laterality: N/A;  ANTERIOR CERVICAL DECOMPRESSION FUSION CERVICAL 3-4 WITH INSTRUMENTATION AND ALLOGRAFT  . CAPSULOTOMY Right 01/26/2013   Procedure: MINOR CAPSULOTOMY;  Surgeon: Myrtha Mantis., MD;  Location: [facility];  Service: Ophthalmology;  Laterality: Right;  . CARPECTOMY Right 03/13/2014   Procedure: RIGHT  PROXIMAL ROW CARPECTOMY;  Surgeon: Tennis Must, MD;  Location: [facility];  Service: Orthopedics;  Laterality: Right;  . CARPECTOMY Left 10/22/2015   Procedure: LEFT WRIST PROXIMAL ROW CARPECTOMY ;  Surgeon: Leanora Cover, MD;  Location: [facility];  Service: Orthopedics;  Laterality: Left;  . CARPOMETACARPAL ([place]) FUSION OF THUMB Right 03/13/2014   Procedure: RIGHT FUSION OF THUMB CARPOMETACARPAL [facility]) JOINT;  Surgeon: Tennis Must, MD;  Location: [facility];  Service: Orthopedics;  Laterality: Right;  . CATARACT EXTRACTION W/ INTRAOCULAR LENS  IMPLANT, BILATERAL  2009  . COLONOSCOPY  08-31-2007  . COLONOSCOPY    . CRYOABLATION N/A 01/14/2015   Procedure: CRYO ABLATION PROSTATE;  Surgeon: Ailene Rud, MD;  Location: [facility];  Service: Urology;  Laterality: N/A;  . ESOPHAGOGASTRODUODENOSCOPY  last one 09-11-2008  . EXCISION MASS LEFT FACE , SUBORBITAL AREA, PLASTER RECONSTRUCTION  02-09-2011  . EXCISIONAL DEBRIDEMENT AND REPAIR RIGHT QUADRICEP TENDON  07-05-2000  . FOOT SURGERY  Left   . I&D EXTREMITY Right 05/24/2013   Procedure: RIGHT INDEX WOUND DEBRIDEMENT AND CLOSURE;  Surgeon: Jolyn Nap, MD;  Location: [facility];  Service: Orthopedics;  Laterality: Right;  . INSERTION OF SUPRAPUBIC CATHETER N/A 01/14/2015   Procedure: SUPRAPUBIC TUBE PLACEMENT;  Surgeon: Ailene Rud, MD;  Location: [facility];  Service: Urology;  Laterality: N/A;  . KNEE ARTHROSCOPY Right 2008  . LARYNGOSCOPY AND ESOPHAGOSCOPY  06-13-2010   MARSUPIALIZATION LEFT LARGE  VALLECULAR CYST  . MASS EXCISION Left 10/22/2015   Procedure: EXCISION MASS OF LEFT INDEX FINGER;  Surgeon: Leanora Cover, MD;  Location: [facility];  Service: Orthopedics;  Laterality: Left;  . ORCHIECTOMY Left 02/24/2015   Procedure: SCROTAL EXPLORATION WITH LEFT ORCHIECTOMY;  Surgeon: Kathie Rhodes, MD;  Location: [facility];  Service: Urology;  Laterality: Left;  . POSTERIOR CERVICAL FUSION/FORAMINOTOMY N/A 01/28/2017   Procedure: POSTERIOR SPINAL FUSION CERVICAL 3-4, CERVICAL 4-5, CERVICAL 5-6, CERVICAL 6-7 WITH INSTRUMENATION AND ALLOGRAFT;  Surgeon: Phylliss Bob, MD;  Location: [facility];  Service: Orthopedics;  Laterality: N/A;  POSTERIOR SPINAL FUSION CERVICAL 3-4, CERVICAL 4-5, CERVICAL 5-6, CERVICAL 6-7 WITH INSTRUMENATION AND ALLOGRAFT  . SHOULDER ARTHROSCOPY/ DEBRIDEMENT LABRAL TEAR/  BICEPS TENOTOMY Left 09-24-2011  . TONSILLECTOMY  as child  . TRANSTHORACIC ECHOCARDIOGRAM  02-21-2010   normal LVF/  ef 55-60%/ mild to moderate MR/  moderate LAE/  mild TR  . YAG LASER APPLICATION Right 6/0/7371   Procedure: YAG LASER APPLICATION;  Surgeon: Myrtha Mantis., MD;  Location: [facility];  Service: Ophthalmology;  Laterality: Right;  . YAG LASER CAPSULOTOMY, LEFT EYE  01-08-2011    There were no vitals filed for this visit.       Subjective Assessment - 03/10/17 2022    Subjective Pt amb. without device to PT eval - states he has a power wheelchair at home that is not working; had cervical decompression/fusion C3-7 on 01-28-17   Pertinent History see snapshot   Patient Stated Goals obtain power wheelchair   Currently in Pain? Yes   Pain Score 10-Worst pain ever   Pain Location Arm   Pain Orientation Left            [facility] PT Assessment - 03/10/17 2023      Assessment   Medical Diagnosis Cervical Radiculopathy: s/p cervical decompression/fusion/foraminotomy   Referring Provider Dr. Kevan Ny   Onset Date/Surgical Date 01/28/17     Precautions   Precautions Fall;Other  (comment)  wearing soft cervical collar and bone stimulator            Mobility/Seating Evaluation    PATIENT INFORMATION: Name: Russell [name] DOB: 08/14/2041  Sex: M Date seen: 03-10-17 Time: 1230  Address:  [address] [place].                 [place], [place] [address] Physician: Dr. Kevan Ny This evaluation/justification form will serve as the LMN for the following suppliers: __________________________ Supplier: [employer] Contact Person: Casper Harrison Phone:  [phone]   Seating Therapist: Guido Sander, PT Phone:   [phone]   Phone: [phone]    Spouse/Parent/Caregiver name: Abdulraheem Pineo  Phone number: [phone] Insurance/Payer: [facility] Medicare/ Medicaid     Reason for Referral: power wheelchair evaluation  Patient/Caregiver Goals: obtain new power wheelchair  Patient was seen for face-to-face evaluation for new power wheelchair.  Also present was [employer] to discuss recommendations and wheelchair options.  Further paperwork was completed and  sent to vendor.  Patient appears to qualify for power mobility device at this time per objective findings.   MEDICAL HISTORY: Diagnosis: Primary Diagnosis: Anterior Cervical Decompression/Discectomy Fusion x 2: C4-6 fusion; C3-4 fusion:  Posterior cervical fusion/foraminotomy 01-28-17 with Cervical fusion C3-7 Onset: 10-16-09 and 01-27-17 Diagnosis: COPD; h/o seizure 2013:  Peripheral Arterial Disease:  HTN:  Type 2 DM, diet controlled:  Dyspnea:  h/o syncope April 2015: Mitral regurgitation   _0 Progressive Disease Relevant past and future surgeries: Anterior cervical decompression/discectomy fusion 10-16-09:  Anterior cervical decompression/discectomy fusion 01-27-17:  Rt Carpectomy 03-13-14:  Lt Carpectomy  10-22-15:  [place] fusion Rt thumb 03-13-14:  Repair Rt quadricep tendon 07-05-00:  Rt knee arthroscopy 2008: Lt shoulder arthroscopy/debridement labral tear/biceps tenotomy 09-24-11   Height: 6'2" Weight: 176" Explain  recent changes or trends in weight: ?????   History including Falls: Pt reports he occasionally uses a cane for assistance with ambulation; pt reports having had 2 falls within past 6 months;  Pt wearing knee orthosis on RLE which he says he has worn for approx. 1 year:  is wearing soft cervical collar due to cervical decompression/ fusion/foraminotomy on 01-27-17, 01-28-17 -  states he must wear collar for 6 more weeks;  pt is also wearing a bone stimulator to assist in cervical healing;  pt wearing splint on Lt wrist    HOME ENVIRONMENT: _1 House  _2 Condo/town home  _3 Apartment  _4 Assisted Living    _5 Lives Alone _6  Lives with Others                                                                                          Hours with caregiver: 22  _7 Home is accessible to patient           Stairs      _8 Yes _9  No     Ramp _10 Yes _11 No Comments:  3 steps in front/ ramp is in back   COMMUNITY ADL: TRANSPORTATION: _12 Car    _13 Van    <TGGYIRSWNIOEVOJJ>_0<\/[id number]>_89 Public Transportation    _15 Adapted w/c Lift    _16 Ambulance    _17 Other:       _18 Sits in wheelchair during transport  Employment/School: ????? Specific requirements pertaining to mobility ?????  Other: Pt uses a truck for transportation    FUNCTIONAL/SENSORY PROCESSING SKILLS:  Handedness:   _19 Right     _20 Left    _21 NA  Comments:  ?????  Functional Processing Skills for Wheeled Mobility _22 Processing Skills are adequate for safe wheelchair operation  Areas of concern than may interfere with safe operation of wheelchair Description of problem   _23  Attention to environment      _24 Judgment      _25  Hearing  _26  Vision or visual processing      _27 Motor Planning  _28  Fluctuations in Behavior  ?????    VERBAL COMMUNICATION: _29 WFL receptive _30  WFL expressive _31 Understandable  _32 Difficult to understand  _33 non-communicative _34  Uses an augmented communication device  CURRENT SEATING / MOBILITY: Current Mobility Base:  _35 None _36 Dependent _37 Manual _38 Scooter _39 Power  Type of  Control: ?????  Manufacturer:  [employer] 2Size:  ?????Age: > 82 yrs old  Current Condition of Mobility Base:  in disrepair   Current Wheelchair components:  ?????  Describe posture in present seating system:  ?????      SENSATION and SKIN ISSUES: Sensation _0 Intact  _1 Impaired _2 Absent  Level of sensation: numbness and tingling in LUE and distal to Lt knee - toes on Lt foot are numb Pressure Relief: Able to perform effective pressure relief :    _3 Yes  _4  No Method: ???? If not, Why?: ?????  Skin Issues/Skin Integrity Current Skin Issues  _5 Yes _6 No _7 Intact _8  Red area_9  Open Area  _10 Scar Tissue _11 At risk from prolonged sitting Where  ?????  History of Skin Issues  _12 Yes _13 No Where  ????? When  ?????  Hx of skin flap surgeries  _14 Yes _15 No Where  ????? When  ?????  Limited sitting tolerance _16 Yes _17 No Hours spent sitting in wheelchair daily: 8+  Complaint of Pain:  Please describe: Pt reports pain in LUE at intensity 10/10 - pt reports constant pain   Swelling/Edema: in bil. hands - pt had carpectomy RUE 03-13-14 and LUE carpectomy 10-22-15   ADL STATUS (in reference to wheelchair use):  Indep Assist Unable Indep with Equip Not assessed Comments  Dressing ????? X ????? ????? ????? needs assistance with buttons and occasional asst. needed with tying shoes  Eating X ????? ????? ????? ????? ?????  Toileting X ????? ????? ????? ????? ?????  Bathing ????? ????? ????? X ????? grab bars used in shower  Grooming/Hygiene X ????? ????? ????? ????? ?????  Meal Prep ????? ????? ????? X ????? ?????  IADLS ????? ????? X ????? ????? ?????  Bowel Management: _18 Continent  _19 Incontinent  _20 Accidents Comments:  ?????  Bladder Management: _21 Continent  _22 Incontinent  _23 Accidents Comments:  ?????     WHEELCHAIR SKILLS: Manual w/c Propulsion: _24 UE or LE strength and endurance sufficient to participate in ADLs using manual wheelchair Arm : _25 left _26 right   _27 Both      Distance:  ????? Foot:  _28 left _29 right   _30 Both  Operate Scooter: _31  Strength, hand grip, balance and transfer appropriate for use _32 Living environment is accessible for use of scooter  Operate Power w/c:  _33  Std. Joystick   _34  Alternative Controls Indep _35  Assist _36  Dependent/unable _37  N/A _38   _39 Safe          _40  Functional      Distance: ?????  Bed confined without wheelchair _41  Yes _42  No   STRENGTH/RANGE OF MOTION:  Active Range of Motion Strength  Shoulder Lt shoulder flexion 45 degrees; abdct. = 39 degrees Rt shoulder flexion 91 degrees; abduction 78  Lt shoulder musc. strength 2+/5 with pain reported with AROM:  Rt shoulder musc. strength 3-/5  Elbow Lt elbow extension -36 degrees Rt elbow extension WFL's  Rt and Lt elbow biceps and triceps strength 4/5  Wrist/Hand Wrist splint worn on LUE due to s/p Lt carpectomy Rt wrist flexion and extension WFL's; Rt and Lt grip strength WFL's Lt wrist musc. strength NT due to pt wearing splint on LUE due to s/p Lt carpectomy 10/2015 Rt wrist musc. strength WFL's: Rt and Lt grip strength WFL's  Hip WFL's passively. AROM bil. hips decr. due to musc. weakness LLE 3-/5:  RLE hip musc. 3+/5  Knee Rt knee flexion 94 degrees; Lt knee ext WNL's Lt knee flexion 86 degrees; Rt knee extension -24 degrees Rt quad strength 4-/5 Lt quad strength 4/5 in available range  Ankle WFL's bil. LE's WFL's bil. LE's for dorsiflexors     MOBILITY/BALANCE:  _43  Patient is totally dependent  for mobility  ?????    Balance Transfers Ambulation  Sitting Balance: Standing Balance: _0  Independent _1  Independent/Modified Independent  _2  WFL     _3  WFL _4  Supervision _5  Supervision  _6  Uses UE for balance  _7  Supervision _8  Min Assist _9  Ambulates with Assist  ?????    _10  Min Assist _11  Min assist _12  Mod Assist _13  Ambulates with Device:      _14  RW  _15  StW  _16  Cane  _17  ?????  _18  Mod Assist _19  Mod assist _20  Max assist   _21  Max Assist _22  Max assist _23  Dependent _24  Indep. Short  Distance Only  _25  Unable _26  Unable _27  Lift / Sling Required Distance (in feet)  30   _28  Sliding board _29  Unable to Ambulate (see explanation below)  Cardio Status:  _30 Intact  _31  Impaired   _32  NA      Mitral regurgitation  Respiratory Status:  _33 Intact   _34 Impaired   _35 NA     has COPD; uses inhaler  Orthotics/Prosthetics: pt wearing [place] brace on RLE for stability due to Rt knee buckling, resulting in fall  Comments (Address manual vs power w/c vs scooter): Pt is unable to functionally and effectively propel a manual wheelchair due to weakness and decr. AROM in bil. UE's and also due to s/p cervical decompression/fusion of C3-7 on 01-28-17.  Pt has Lt wrist limited AROM and is currently wearing a splint on his LUE; he has undergone Lt carpectomy (Nov. 2016) and Rt carpectomy March 2015.  He reports severe pain in LUE at time of eval, rating pain 10/10 intensity with shoulder AROM of LUE limited by pain.  Pt is currently wearing a bone stimulator to assist with cervical vertebra healing.  Pt is unable to amb. safely due to decreased balance and bil. LE weakness.  Pt is wearing a soft cervical collar s/p cervical surgery on 01-28-17 which limits cervical ROM.  Pt's Timed Up and Go score is 25.37 secs which is indicative of high fall risk, as score is > 13.5 secs.  Pt is wearing a brace on RLE and reports Rt knee occasionally buckles, resulting in loss of balance and fall.  Pt is unable to operate a scooter due to inability to hold UE's in flexed position to operate the tiller.  Pt would not be able to maneuver a scooter in his home environment as it has a larger turning radius.  A power wheelchair would provide pt independence and safety with mobility and ADL's in his home environment and significantly minimize his risk of falling.          Anterior / Posterior Obliquity Rotation-Pelvis ?????  PELVIS    _36  _37  _38   Neutral Posterior Anterior  _39  _40  _41   WFL Rt elev Lt elev  _42  _43  _44   WFL Right Left                       Anterior    Anterior     _45  Fixed _46  Other _47  Partly Flexible _48  Flexible   _49  Fixed _50  Other _51  Partly Flexible  _52  Flexible  _53  Fixed _54  Other _55  Partly Flexible  _56  Flexible   TRUNK  _57  _58  _59   WFL ? Thoracic ? Lumbar  Kyphosis Lordosis  _60  _61  _62   [facility] Convex Convex  Right Left _63 c-curve _64 s-curve _65 multiple  _66  Neutral _67  Left-anterior _68  Right-anterior     _69  Fixed _70  Flexible _71  Partly Flexible _72  Other  _73   Fixed _0  Flexible _1  Partly Flexible _2  Other  _3  Fixed             _4  Flexible _5  Partly Flexible _6  Other    Position Windswept  ?????  HIPS          _7            _8               _9    Neutral       Abduct        ADduct         _10           _11            _12   Neutral Right           Left      _13  Fixed _14  Subluxed _15  Partly Flexible _16  Dislocated _17  Flexible  _18  Fixed _19  Other _20  Partly Flexible  _21  Flexible                 Foot Positioning Knee Positioning  ?????    _22  WFL  _23 Lt _24 Rt _25  WFL  _26 Lt _27 Rt    KNEES ROM concerns: ROM concerns:    & Dorsi-Flexed _28 Lt _29 Rt Rt knee flexed to approx. 90 degrees:  Lt knee flexed to approx. 85 degrees     FEET Plantar Flexed _30 Lt _31 Rt      Inversion                 _32 Lt _33 Rt      Eversion                 _34 Lt _35 Rt     HEAD _36  Functional _37  Good Head Control  pt wearing soft cervical collar due to cervical discectomy sx on 01-27-17 and 01-28-17 and also wearing bone stimulator for cervical healing 4 hours/day  & _38  Flexed         _39  Extended _40  Adequate Head Control    NECK _41  Rotated  Lt  _42  Lat Flexed Lt _43  Rotated  Rt _44  Lat Flexed Rt _45  Limited Head Control     _46  Cervical Hyperextension _47  Absent  Head Control     SHOULDERS ELBOWS WRIST& HAND wearing splint on LUE due to Lt carpectomy in Nov. 2016      Left     Right    Left     Right    Left     Right   U/E _48 Functional           _49 Functional ????? ????? _50 Fisting             _51 Fisting      _52 elev   _53 dep      _54 elev   _55 dep        _56 pro -_57 retract     _58 pro  _59 retract _60 subluxed             _61 subluxed           Goals for Wheelchair Mobility  _62  Independence with mobility in the home with motor related ADLs (MRADLs)  _63  Independence with MRADLs in the community _64  Provide dependent mobility  _65  Provide recline     _66 Provide tilt   Goals for Seating system _67  Optimize pressure distribution _68  Provide support needed to facilitate function or safety _69  Provide corrective forces to assist with maintaining or improving posture _70  Accommodate client's posture:   current seated postures and positions are not flexible or will not tolerate  corrective forces _0  Client to be independent with relieving pressure in the wheelchair _1 Enhance physiological function such as breathing, swallowing, digestion  Simulation ideas/Equipment trials:????? State why other equipment was unsuccessful:?????   MOBILITY BASE RECOMMENDATIONS and JUSTIFICATION: MOBILITY COMPONENT JUSTIFICATION  Manufacturer: [employer]   Size: Width 18"Seat Depth 20" _2 provide transport from point A to B      _3 promote Indep mobility  _4 is not a safe, functional ambulator _5 walker or cane inadequate _6 non-standard width/depth necessary to accommodate anatomical measurement _7  ?????  _8 Manual Mobility Base _9 non-functional ambulator    _10 Scooter/POV  _11 can safely operate  _12 can safely transfer   _13 has adequate trunk stability  _14 cannot functionally propel manual w/c  _15 Power Mobility Base  _16 non-ambulatory  _17 cannot functionally propel manual wheelchair  _18  cannot functionally and safely operate scooter/POV _19 can safely operate and willing to  _20 Stroller Base _21 infant/child  _22 unable to propel manual wheelchair _23 allows for growth _24 non-functional ambulator _25 non-functional UE _26 Indep mobility is not a goal at this time  _27 Tilt  _28 Forward _29 Backward _30 Powered tilt  _31 Manual tilt  _32 change position against gravitational force  on head and shoulders  _33 change position for pressure relief/cannot weight shift _34 transfers  _35 management of tone _36 rest periods _37 control edema _38 facilitate postural control  _39  ?????  _40 Recline  _41 Power recline on power base _42 Manual recline on manual base  _43 accommodate femur to back angle  _44 bring to full recline for ADL care  _45 change position for pressure relief/cannot weight shift _46 rest periods _47 repositioning for transfers or clothing/diaper /catheter changes _48 head positioning  _49 Lighter weight required _50 self- propulsion  _51 lifting _52  ?????  _53 Heavy Duty required _54 user weight greater than 250# _55 extreme tone/ over active movement _56 broken frame on previous chair _57  ?????  _58  Back  _59  Angle Adjustable _60  Custom molded Captain's Seat _61 postural control _62 control of tone/spasticity _63 accommodation of range of motion _64 UE functional control _65 accommodation for seating system _66  ????? _67 provide lateral trunk support _68 accommodate deformity _69 provide posterior trunk support _70 provide lumbar/sacral support _71 support trunk in midline _72 Pressure relief over spinal processes  _73  Seat Cushion Captain's Seat _74 impaired sensation  _75 decubitus ulcers present _76 history of pressure ulceration _77 prevent pelvic extension _78 low maintenance  _79 stabilize pelvis  _80 accommodate obliquity _81 accommodate multiple deformity _82 neutralize lower extremity position _83 increase pressure distribution _84  ?????  _85  Pelvic/thigh support  _86  Lateral thigh guide _87  Distal medial pad  _88  Distal lateral pad _89  pelvis in neutral _90 accommodate pelvis _91  position upper legs _92  alignment _93  accommodate ROM _94  decr adduction _95 accommodate tone _96 removable for transfers _97 decr abduction  _98  Lateral trunk Supports _99  Lt     _100  Rt _101 decrease lateral trunk leaning _102 control tone _103 contour for increased contact _104 safety  _105 accommodate asymmetry _106  ?????  _107  Mounting hardware  _108 lateral  trunk supports  _109 back   _110 seat _111 headrest      _112  thigh support _113 fixed   _114 swing away _115 attach seat platform/cushion to w/c frame _116 attach back cushion to w/c frame _117 mount postural supports _118 mount headrest  _119 swing medial thigh support away _120 swing lateral supports away for transfers  _121  ?????    Armrests  _122 fixed _123 adjustable height _124 removable   _125 swing away  _126 flip back   _127 reclining _128 full length pads _129 desk    _130 pads tubular  _131 provide support with elbow at 90   _132 provide support for w/c tray _133 change of height/angles for variable activities _134 remove for transfers _135 allow to come closer to table top _136 remove for access to tables _137  ?????  Hangers/ Leg rests  _138 60 _139 70 _140 90 _141 elevating _142 heavy duty  _143 articulating _144 fixed _145 lift off _146 swing away     _147   power _0 provide LE support  _1 accommodate to hamstring tightness _2 elevate legs during recline   _3 provide change in position for Legs _4 Maintain placement of feet on footplate _5 durability _6 enable transfers _7 decrease edema _8 Accommodate lower leg length _9  ?????  Foot support Footplate    <[id number]>_2<\/IWLNLGXQJJHERDEY>_81 Lt  _11  Rt  _12  Center mount _13 flip up     _14 depth/angle adjustable _15 Amputee adapter    _16  Lt     _17  Rt _18 provide foot support _19 accommodate to ankle ROM _20 transfers _21 Provide support for residual extremity _22  allow foot to go under wheelchair base _23  decrease tone  _24  ?????  _25  Ankle strap/heel loops _26 support foot on foot support _27 decrease extraneous movement _28 provide input to heel  _29 protect foot  Tires: _30 pneumatic  _31 flat free inserts  _32 solid  _33 decrease maintenance  _34 prevent frequent flats _35 increase shock absorbency _36 decrease pain from road shock _37 decrease spasms from road shock _38  ?????  _39  Headrest  _40 provide posterior head support _41 provide posterior neck support _42 provide lateral head support _43 provide anterior head support _44 support during tilt and recline _45 improve feeding   _46 improve  respiration _47 placement of switches _48 safety  _49 accommodate ROM  _50 accommodate tone _51 improve visual orientation  _52  Anterior chest strap _53  Vest _54  Shoulder retractors  _55 decrease forward movement of shoulder _56 accommodation of TLSO _57 decrease forward movement of trunk _58 decrease shoulder elevation _59 added abdominal support _60 alignment _61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

## 2017-04-21 ENCOUNTER — Emergency Department (HOSPITAL_COMMUNITY)
Admission: EM | Admit: 2017-04-21 | Discharge: 2017-04-21 | Disposition: A | Discharge status: Home / Self Care | Payer: Medicare Other | Attending: Dermatology | Admitting: Dermatology

## 2017-04-21 ENCOUNTER — Encounter (HOSPITAL_COMMUNITY): Payer: Self-pay

## 2017-04-21 DIAGNOSIS — Y929 Unspecified place or not applicable: Secondary | ICD-10-CM | POA: Insufficient documentation

## 2017-04-21 DIAGNOSIS — Z5321 Procedure and treatment not carried out due to patient leaving prior to being seen by health care provider: Secondary | ICD-10-CM | POA: Insufficient documentation

## 2017-04-21 DIAGNOSIS — X58XXXA Exposure to other specified factors, initial encounter: Secondary | ICD-10-CM | POA: Insufficient documentation

## 2017-04-21 DIAGNOSIS — Y939 Activity, unspecified: Secondary | ICD-10-CM | POA: Diagnosis not present

## 2017-04-21 DIAGNOSIS — Y999 Unspecified external cause status: Secondary | ICD-10-CM | POA: Insufficient documentation

## 2017-04-21 DIAGNOSIS — T23009A Burn of unspecified degree of unspecified hand, unspecified site, initial encounter: Secondary | ICD-10-CM | POA: Insufficient documentation

## 2017-04-21 NOTE — ED Notes (Signed)
Pt observed walking out the door with family, pt refused to stay, states he is going else where to be seen. Told pt he would be moved next.
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
## 2017-04-22 ENCOUNTER — Emergency Department (HOSPITAL_COMMUNITY)
Admission: EM | Admit: 2017-04-22 | Discharge: 2017-04-22 | Disposition: A | Discharge status: Home / Self Care | Payer: Medicare Other | Attending: Emergency Medicine | Admitting: Emergency Medicine

## 2017-04-22 ENCOUNTER — Encounter (HOSPITAL_COMMUNITY): Payer: Self-pay | Admitting: Emergency Medicine

## 2017-04-22 DIAGNOSIS — Z8546 Personal history of malignant neoplasm of prostate: Secondary | ICD-10-CM | POA: Diagnosis not present

## 2017-04-22 DIAGNOSIS — X04XXXA Exposure to ignition of highly flammable material, initial encounter: Secondary | ICD-10-CM | POA: Insufficient documentation

## 2017-04-22 DIAGNOSIS — E119 Type 2 diabetes mellitus without complications: Secondary | ICD-10-CM | POA: Diagnosis not present

## 2017-04-22 DIAGNOSIS — J449 Chronic obstructive pulmonary disease, unspecified: Secondary | ICD-10-CM | POA: Diagnosis not present

## 2017-04-22 DIAGNOSIS — Y929 Unspecified place or not applicable: Secondary | ICD-10-CM | POA: Insufficient documentation

## 2017-04-22 DIAGNOSIS — T2020XA Burn of second degree of head, face, and neck, unspecified site, initial encounter: Secondary | ICD-10-CM

## 2017-04-22 DIAGNOSIS — T2026XA Burn of second degree of forehead and cheek, initial encounter: Secondary | ICD-10-CM | POA: Insufficient documentation

## 2017-04-22 DIAGNOSIS — F17210 Nicotine dependence, cigarettes, uncomplicated: Secondary | ICD-10-CM | POA: Insufficient documentation

## 2017-04-22 DIAGNOSIS — I10 Essential (primary) hypertension: Secondary | ICD-10-CM | POA: Insufficient documentation

## 2017-04-22 DIAGNOSIS — Y999 Unspecified external cause status: Secondary | ICD-10-CM | POA: Diagnosis not present

## 2017-04-22 DIAGNOSIS — I2510 Atherosclerotic heart disease of native coronary artery without angina pectoris: Secondary | ICD-10-CM | POA: Insufficient documentation

## 2017-04-22 DIAGNOSIS — T23261A Burn of second degree of back of right hand, initial encounter: Secondary | ICD-10-CM

## 2017-04-22 DIAGNOSIS — Y939 Activity, unspecified: Secondary | ICD-10-CM | POA: Insufficient documentation

## 2017-04-22 DIAGNOSIS — T2023XA Burn of second degree of chin, initial encounter: Secondary | ICD-10-CM | POA: Insufficient documentation

## 2017-04-22 DIAGNOSIS — T23262A Burn of second degree of back of left hand, initial encounter: Secondary | ICD-10-CM | POA: Insufficient documentation

## 2017-04-22 DIAGNOSIS — T23061A Burn of unspecified degree of back of right hand, initial encounter: Secondary | ICD-10-CM | POA: Diagnosis present

## 2017-04-22 MED ORDER — BACITRACIN ZINC 500 UNIT/GM EX OINT: 1.0000 "application " | TOPICAL_OINTMENT | Freq: Two times a day (BID) | CUTANEOUS | 0 refills | Status: DC
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
## 2017-05-22 NOTE — Addendum Note (Signed)
Addendum  created 05/22/17 0922 by Tiffeny Minchew, MD   Sign clinical note    

## 2017-05-27 ENCOUNTER — Ambulatory Visit: Payer: Medicare Other | Admitting: Podiatry

## 2017-06-01 ENCOUNTER — Encounter: Payer: Self-pay | Admitting: Podiatry

## 2017-06-01 ENCOUNTER — Ambulatory Visit (INDEPENDENT_AMBULATORY_CARE_PROVIDER_SITE_OTHER): Payer: Medicare Other | Admitting: Podiatry

## 2017-06-01 DIAGNOSIS — G5762 Lesion of plantar nerve, left lower limb: Secondary | ICD-10-CM | POA: Diagnosis not present

## 2017-06-01 NOTE — Progress Notes (Signed)
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 He states it is still tender. There is better than it was.  Objective: Pulses are palpable left but barely. He has a painful neuroma third interspace left foot. Palpation demonstrates a palpable Mulder's click.  Assessment: Neuroma third interdigital space left.  Plan: I injected the area today from distal proximal with dehydrated alcohol and I'll follow-up with him in 3 weeks.

## 2017-06-22 ENCOUNTER — Ambulatory Visit (INDEPENDENT_AMBULATORY_CARE_PROVIDER_SITE_OTHER): Payer: Medicare Other | Admitting: Podiatry

## 2017-06-22 ENCOUNTER — Encounter: Payer: Self-pay | Admitting: Podiatry

## 2017-06-22 DIAGNOSIS — G5762 Lesion of plantar nerve, left lower limb: Secondary | ICD-10-CM | POA: Diagnosis not present

## 2017-06-22 NOTE — Progress Notes (Signed)
He presents today for follow-up of his neuroma to the third interdigital space of the left foot. He states he was better for a while but has now started to hurt again.  Objective: Vital signs are stable he is alert and oriented 3 majority of his pain is on palpation at the proximal end of a previous incision. Third interdigital space.  Assessment: Neuroma or stump neuroma third interdigital space left foot.  Plan: Injected the area directly today with dehydrated alcohol, follow-up with him in 3 weeks.

## 2017-06-23 ENCOUNTER — Emergency Department (HOSPITAL_COMMUNITY): Payer: Medicare Other

## 2017-06-23 ENCOUNTER — Observation Stay (HOSPITAL_COMMUNITY): Payer: Medicare Other

## 2017-06-23 ENCOUNTER — Encounter (HOSPITAL_COMMUNITY): Payer: Self-pay | Admitting: *Deleted

## 2017-06-23 ENCOUNTER — Observation Stay (HOSPITAL_COMMUNITY)
Admission: EM | Admit: 2017-06-23 | Discharge: 2017-06-25 | Disposition: A | Discharge status: Home / Self Care | Payer: Medicare Other | Attending: Internal Medicine | Admitting: Internal Medicine

## 2017-06-23 DIAGNOSIS — R4182 Altered mental status, unspecified: Secondary | ICD-10-CM | POA: Diagnosis present

## 2017-06-23 DIAGNOSIS — I208 Other forms of angina pectoris: Secondary | ICD-10-CM | POA: Diagnosis not present

## 2017-06-23 DIAGNOSIS — E860 Dehydration: Secondary | ICD-10-CM | POA: Diagnosis not present

## 2017-06-23 DIAGNOSIS — J449 Chronic obstructive pulmonary disease, unspecified: Secondary | ICD-10-CM | POA: Diagnosis not present

## 2017-06-23 DIAGNOSIS — F17210 Nicotine dependence, cigarettes, uncomplicated: Secondary | ICD-10-CM | POA: Diagnosis not present

## 2017-06-23 DIAGNOSIS — E119 Type 2 diabetes mellitus without complications: Secondary | ICD-10-CM | POA: Diagnosis not present

## 2017-06-23 DIAGNOSIS — R079 Chest pain, unspecified: Secondary | ICD-10-CM | POA: Diagnosis present

## 2017-06-23 DIAGNOSIS — I10 Essential (primary) hypertension: Secondary | ICD-10-CM | POA: Diagnosis not present

## 2017-06-23 DIAGNOSIS — N390 Urinary tract infection, site not specified: Secondary | ICD-10-CM | POA: Diagnosis not present

## 2017-06-23 DIAGNOSIS — N179 Acute kidney failure, unspecified: Secondary | ICD-10-CM | POA: Diagnosis present

## 2017-06-23 DIAGNOSIS — Z79899 Other long term (current) drug therapy: Secondary | ICD-10-CM | POA: Insufficient documentation

## 2017-06-23 DIAGNOSIS — N19 Unspecified kidney failure: Secondary | ICD-10-CM

## 2017-06-23 DIAGNOSIS — I200 Unstable angina: Secondary | ICD-10-CM

## 2017-06-23 DIAGNOSIS — N1832 Chronic kidney disease, stage 3b: Secondary | ICD-10-CM | POA: Diagnosis present

## 2017-06-23 DIAGNOSIS — R0789 Other chest pain: Secondary | ICD-10-CM | POA: Diagnosis not present

## 2017-06-23 HISTORY — DX: Acute kidney failure, unspecified: N17.9

## 2017-06-23 LAB — BASIC METABOLIC PANEL
Anion gap: 12 (ref 5–15)
BUN: 43 mg/dL — AB (ref 6–20)
CALCIUM: 9.6 mg/dL (ref 8.9–10.3)
CHLORIDE: 99 mmol/L — AB (ref 101–111)
CO2: 22 mmol/L (ref 22–32)
CREATININE: 2.06 mg/dL — AB (ref 0.61–1.24)
GFR calc non Af Amer: 30 mL/min — ABNORMAL LOW (ref >60)
GFR, EST AFRICAN AMERICAN: 34 mL/min — AB (ref >60)
GLUCOSE: 124 mg/dL — AB (ref 65–99)
Potassium: 4.5 mmol/L (ref 3.5–5.1)
Sodium: 133 mmol/L — ABNORMAL LOW (ref 135–145)

## 2017-06-23 LAB — TROPONIN I
Troponin I: <0.03 ng/mL (ref <0.03)
Troponin I: <0.03 ng/mL (ref <0.03)

## 2017-06-23 LAB — CBC WITH DIFFERENTIAL/PLATELET
BASOS PCT: 0 %
Basophils Absolute: 0.1 10*3/uL (ref 0.0–0.1)
Eosinophils Absolute: 0.1 10*3/uL (ref 0.0–0.7)
Eosinophils Relative: 1 %
HEMATOCRIT: 36.9 % — AB (ref 39.0–52.0)
HEMOGLOBIN: 11.7 g/dL — AB (ref 13.0–17.0)
LYMPHS ABS: 3.3 10*3/uL (ref 0.7–4.0)
LYMPHS PCT: 26 %
MCH: 24.7 pg — AB (ref 26.0–34.0)
MCHC: 31.7 g/dL (ref 30.0–36.0)
MCV: 78 fL (ref 78.0–100.0)
MONO ABS: 0.9 10*3/uL (ref 0.1–1.0)
MONOS PCT: 7 %
NEUTROS ABS: 8.4 10*3/uL — AB (ref 1.7–7.7)
NEUTROS PCT: 66 %
Platelets: 330 10*3/uL (ref 150–400)
RBC: 4.73 MIL/uL (ref 4.22–5.81)
RDW: 17.7 % — AB (ref 11.5–15.5)
WBC: 12.8 10*3/uL — ABNORMAL HIGH (ref 4.0–10.5)

## 2017-06-23 LAB — URINALYSIS, COMPLETE (UACMP) WITH MICROSCOPIC
BILIRUBIN URINE: NEGATIVE
Glucose, UA: NEGATIVE mg/dL
HGB URINE DIPSTICK: NEGATIVE
Ketones, ur: NEGATIVE mg/dL
NITRITE: NEGATIVE
PROTEIN: NEGATIVE mg/dL
Specific Gravity, Urine: 1.01 (ref 1.005–1.030)
Squamous Epithelial / LPF: NONE SEEN
pH: 5 (ref 5.0–8.0)

## 2017-06-23 MED ORDER — ONDANSETRON HCL 4 MG/2ML IJ SOLN: 4.0000 mg | Freq: Four times a day (QID) | INTRAMUSCULAR | Status: DC | PRN

## 2017-06-23 MED ORDER — GABAPENTIN 300 MG PO CAPS
300.0000 mg | ORAL_CAPSULE | Freq: Two times a day (BID) | ORAL | Status: DC
  Administered 2017-06-23 – 2017-06-25 (×4): 300 mg via ORAL
  Filled 2017-06-23 (×4): qty 1

## 2017-06-23 MED ORDER — TAMSULOSIN HCL 0.4 MG PO CAPS
0.4000 mg | ORAL_CAPSULE | Freq: Every day | ORAL | Status: DC
  Administered 2017-06-24: 0.4 mg via ORAL
  Filled 2017-06-23: qty 1

## 2017-06-23 MED ORDER — TIMOLOL MALEATE 0.5 % OP SOLN
1.0000 [drp] | Freq: Two times a day (BID) | OPHTHALMIC | Status: DC
  Administered 2017-06-25: 1 [drp] via OPHTHALMIC
  Filled 2017-06-23: qty 5

## 2017-06-23 MED ORDER — SODIUM CHLORIDE 0.9 % IV SOLN
INTRAVENOUS | Status: DC
  Administered 2017-06-23 – 2017-06-25 (×3): via INTRAVENOUS

## 2017-06-23 MED ORDER — HEPARIN SODIUM (PORCINE) 5000 UNIT/ML IJ SOLN
5000.0000 [IU] | Freq: Three times a day (TID) | INTRAMUSCULAR | Status: DC
  Administered 2017-06-23 – 2017-06-25 (×5): 5000 [IU] via SUBCUTANEOUS
  Filled 2017-06-23 (×5): qty 1

## 2017-06-23 MED ORDER — BRINZOLAMIDE 1 % OP SUSP
1.0000 [drp] | Freq: Three times a day (TID) | OPHTHALMIC | Status: DC
  Administered 2017-06-23 – 2017-06-25 (×5): 1 [drp] via OPHTHALMIC
  Filled 2017-06-23: qty 10

## 2017-06-23 MED ORDER — MORPHINE SULFATE (PF) 2 MG/ML IV SOLN: 2.0000 mg | INTRAVENOUS | Status: DC | PRN

## 2017-06-23 MED ORDER — BRIMONIDINE TARTRATE 0.2 % OP SOLN
1.0000 [drp] | Freq: Two times a day (BID) | OPHTHALMIC | Status: DC
  Administered 2017-06-23 – 2017-06-24 (×3): 1 [drp] via OPHTHALMIC
  Filled 2017-06-23: qty 5

## 2017-06-23 MED ORDER — ACETAMINOPHEN 325 MG PO TABS: 650.0000 mg | ORAL_TABLET | ORAL | Status: DC | PRN

## 2017-06-23 MED ORDER — GI COCKTAIL ~~LOC~~: 30.0000 mL | Freq: Four times a day (QID) | ORAL | Status: DC | PRN

## 2017-06-23 MED ORDER — BRIMONIDINE TARTRATE-TIMOLOL 0.2-0.5 % OP SOLN
1.0000 [drp] | Freq: Two times a day (BID) | OPHTHALMIC | Status: DC
  Filled 2017-06-23: qty 5

## 2017-06-23 MED ORDER — SODIUM CHLORIDE 0.9 % IV BOLUS (SEPSIS)
1000.0000 mL | Freq: Once | INTRAVENOUS | Status: AC
  Administered 2017-06-23: 1000 mL via INTRAVENOUS

## 2017-06-23 MED ORDER — PRAVASTATIN SODIUM 40 MG PO TABS
80.0000 mg | ORAL_TABLET | Freq: Every day | ORAL | Status: DC
  Administered 2017-06-24 – 2017-06-25 (×2): 80 mg via ORAL
  Filled 2017-06-23: qty 2
  Filled 2017-06-23: qty 4

## 2017-06-23 MED ORDER — ASPIRIN EC 81 MG PO TBEC
81.0000 mg | DELAYED_RELEASE_TABLET | Freq: Every day | ORAL | Status: DC
  Administered 2017-06-24 – 2017-06-25 (×2): 81 mg via ORAL
  Filled 2017-06-23 (×2): qty 1

## 2017-06-23 MED ORDER — ASPIRIN 81 MG PO CHEW
324.0000 mg | CHEWABLE_TABLET | Freq: Once | ORAL | Status: AC
Start: 2017-06-23 — End: 2017-06-23
  Administered 2017-06-23: 324 mg via ORAL
  Filled 2017-06-23: qty 4

## 2017-06-23 MED ORDER — OXYBUTYNIN CHLORIDE 5 MG PO TABS
5.0000 mg | ORAL_TABLET | Freq: Two times a day (BID) | ORAL | Status: DC
  Administered 2017-06-23 – 2017-06-25 (×4): 5 mg via ORAL
  Filled 2017-06-23 (×4): qty 1

## 2017-06-23 MED ORDER — HYDROCODONE-ACETAMINOPHEN 7.5-325 MG PO TABS
1.0000 | ORAL_TABLET | Freq: Three times a day (TID) | ORAL | Status: DC | PRN
  Administered 2017-06-23 – 2017-06-25 (×4): 1 via ORAL
  Filled 2017-06-23 (×4): qty 1

## 2017-06-23 NOTE — H&P (Addendum)
History and Physical    Russell [name] ZOX:[id number] DOB: 03-19-1941 DOA: 06/23/2017  PCP: Rogers Blocker, MD   Patient coming from: Home  I have personally briefly reviewed patient's old medical records in [place]  Chief Complaint: Chest pain Patient is a poor historian  HPI: Russell [name] is a 76 y.o. male with medical history significant of hypertension, dyslipidemia, COPD, tobacco abuse, GERD, depression, glaucoma, mitral valve prolapse with mitral regurgitation, peripheral arterial disease, bradycardia and syncope presented with complaints of anterior chest wall pain lasting several minutes which has resolved. Patient is a vague historian and does not elaborate on the chest pain. He also complains of generalized weakness starting today. Patient happened to was seated on his porch on his symptoms started. He also had bilateral arm pain. He had 1 episode of nausea and vomiting. Family had reported the patient was more confused today but patient denies any confusion. No history of recent falls, seizures, fever, diarrhea, dysuria or hematuria.  ED Course: Troponin was negative, EKG showed sinus bradycardia. Creatinine was found to be elevated at 2.06. He was given IV fluids and hospitalist was called to admit the patient  Review of Systems: As per HPI otherwise 10 point review of systems negative.    Past Medical History:  Diagnosis Date  . Arthritis    SHOUDLERS, LUMBAR  . BPH with obstruction/lower urinary tract symptoms   . BPH with urinary obstruction 01/15/2016  . COPD (chronic obstructive pulmonary disease) ([place])   . Coronary atherosclerosis    denied knowledge of this 10/27/16  . Depression   . Diverticulosis of colon   . Dyspnea   . ED (erectile dysfunction)   . Full dentures   . GERD (gastroesophageal reflux disease)    not current  . Glaucoma   . History of gastritis   . History of scabies    2008  . History of seizure    2013-- x2   idiopathic --   none since  . History of syncope    03-22-2014  DX  VAGAL RESPONSE  . Hyperlipidemia, mixed   . Hypertension   . Mitral regurgitation   . Peripheral arterial disease ([place])    one vessel runoff bilaterally via peroneal arteries  . Prostate cancer ([place])   . Seizures ([place]) 03/2014   ? or syncope- patient refused to be admitted for further studies- "felt better"  . Type 2 diabetes, diet controlled ([place])    patient and his wife said no- have not been told 01/21/17  . Wears glasses     Past Surgical History:  Procedure Laterality Date  . ANTERIOR CERVICAL DECOMP/DISCECTOMY FUSION  10-16-2009   C4 -- C6  . ANTERIOR CERVICAL DECOMP/DISCECTOMY FUSION N/A 01/27/2017   Procedure: ANTERIOR CERVICAL DECOMPRESSION FUSION CERVICAL 3-4 WITH INSTRUMENTATION AND ALLOGRAFT;  Surgeon: Phylliss Bob, MD;  Location: [place];  Service: Orthopedics;  Laterality: N/A;  ANTERIOR CERVICAL DECOMPRESSION FUSION CERVICAL 3-4 WITH INSTRUMENTATION AND ALLOGRAFT  . CAPSULOTOMY Right 01/26/2013   Procedure: MINOR CAPSULOTOMY;  Surgeon: Myrtha Mantis., MD;  Location: [place];  Service: Ophthalmology;  Laterality: Right;  . CARPECTOMY Right 03/13/2014   Procedure: RIGHT  PROXIMAL ROW CARPECTOMY;  Surgeon: Tennis Must, MD;  Location: [place];  Service: Orthopedics;  Laterality: Right;  . CARPECTOMY Left 10/22/2015   Procedure: LEFT WRIST PROXIMAL ROW CARPECTOMY ;  Surgeon: Leanora Cover, MD;  Location: [place];  Service: Orthopedics;  Laterality: Left;  .  CARPOMETACARPAL ([place]) FUSION OF THUMB Right 03/13/2014   Procedure: RIGHT FUSION OF THUMB CARPOMETACARPAL [facility]) JOINT;  Surgeon: Tennis Must, MD;  Location: [place];  Service: Orthopedics;  Laterality: Right;  . CATARACT EXTRACTION W/ INTRAOCULAR LENS  IMPLANT, BILATERAL  2009  . COLONOSCOPY  08-31-2007  . COLONOSCOPY    . CRYOABLATION N/A 01/14/2015   Procedure: CRYO ABLATION PROSTATE;  Surgeon: Ailene Rud,  MD;  Location: [facility];  Service: Urology;  Laterality: N/A;  . ESOPHAGOGASTRODUODENOSCOPY  last one 09-11-2008  . EXCISION MASS LEFT FACE , SUBORBITAL AREA, PLASTER RECONSTRUCTION  02-09-2011  . EXCISIONAL DEBRIDEMENT AND REPAIR RIGHT QUADRICEP TENDON  07-05-2000  . FOOT SURGERY Left   . I&D EXTREMITY Right 05/24/2013   Procedure: RIGHT INDEX WOUND DEBRIDEMENT AND CLOSURE;  Surgeon: Jolyn Nap, MD;  Location: [place];  Service: Orthopedics;  Laterality: Right;  . INSERTION OF SUPRAPUBIC CATHETER N/A 01/14/2015   Procedure: SUPRAPUBIC TUBE PLACEMENT;  Surgeon: Ailene Rud, MD;  Location: [facility];  Service: Urology;  Laterality: N/A;  . KNEE ARTHROSCOPY Right 2008  . LARYNGOSCOPY AND ESOPHAGOSCOPY  06-13-2010   MARSUPIALIZATION LEFT LARGE VALLECULAR CYST  . MASS EXCISION Left 10/22/2015   Procedure: EXCISION MASS OF LEFT INDEX FINGER;  Surgeon: Leanora Cover, MD;  Location: [place];  Service: Orthopedics;  Laterality: Left;  . ORCHIECTOMY Left 02/24/2015   Procedure: SCROTAL EXPLORATION WITH LEFT ORCHIECTOMY;  Surgeon: Kathie Rhodes, MD;  Location: [place];  Service: Urology;  Laterality: Left;  . POSTERIOR CERVICAL FUSION/FORAMINOTOMY N/A 01/28/2017   Procedure: POSTERIOR SPINAL FUSION CERVICAL 3-4, CERVICAL 4-5, CERVICAL 5-6, CERVICAL 6-7 WITH INSTRUMENATION AND ALLOGRAFT;  Surgeon: Phylliss Bob, MD;  Location: [place];  Service: Orthopedics;  Laterality: N/A;  POSTERIOR SPINAL FUSION CERVICAL 3-4, CERVICAL 4-5, CERVICAL 5-6, CERVICAL 6-7 WITH INSTRUMENATION AND ALLOGRAFT  . SHOULDER ARTHROSCOPY/ DEBRIDEMENT LABRAL TEAR/  BICEPS TENOTOMY Left 09-24-2011  . TONSILLECTOMY  as child  . TRANSTHORACIC ECHOCARDIOGRAM  02-21-2010   normal LVF/  ef 55-60%/ mild to moderate MR/  moderate LAE/  mild TR  . YAG LASER APPLICATION Right 12/27/7937   Procedure: YAG LASER APPLICATION;  Surgeon: Myrtha Mantis., MD;  Location: [place];  Service: Ophthalmology;  Laterality: Right;  . YAG LASER CAPSULOTOMY, LEFT EYE  01-08-2011   Social history  reports that he has been smoking Cigarettes.  He has a 59.00 pack-year smoking history. He has never used smokeless tobacco. He reports that he does not drink alcohol or use drugs. Lives at home with wife  Allergies  Allergen Reactions  . No Known Allergies     Family History  Problem Relation Age of Onset  . Unexplained death Mother        died at a young age, no known cause  . Heart attack Father        age unknown  . Diabetes Unknown   . Cancer Unknown     Prior to Admission medications   Medication Sig Start Date End Date Taking? Authorizing Provider  AZOPT 1 % ophthalmic suspension Place 1 drop into the left eye 3 (three) times daily.  12/25/16  Yes [provider]  bimatoprost (LUMIGAN) 0.01 % SOLN Place 1 drop into both eyes at bedtime. 04/10/13  Yes Dhungel, Nishant, MD  brimonidine-timolol (COMBIGAN) 0.2-0.5 % ophthalmic solution Place 1 drop into both eyes 2 (two) times daily.    Yes [provider]  esomeprazole (NEXIUM) 40 MG capsule Take 40 mg by mouth daily. 06/21/17  Yes [provider]  gabapentin (NEURONTIN) 300 MG capsule Take 300 mg by mouth 2 (two) times daily.  10/19/16  Yes [provider]  HYDROcodone-acetaminophen (NORCO) 7.5-325 MG tablet Take 1 tablet by mouth every 8 (eight) hours as needed for moderate pain. pain 12/02/15  Yes [provider]  losartan (COZAAR) 100 MG tablet Take 100 mg by mouth daily.  10/19/16  Yes [provider]  meclizine (ANTIVERT) 25 MG tablet Take 25 mg by mouth daily. 06/16/17  Yes [provider]  Multiple Vitamins-Minerals (CENTRUM SILVER ADULT 50+ PO) Take 1 tablet by mouth daily.   Yes [provider]  oxybutynin (DITROPAN) 5 MG tablet TAKE 1 TABLET EVERY 12 HOURS   Yes [provider]  pravastatin (PRAVACHOL) 80 MG tablet Take 80 mg by  mouth daily.  10/19/16  Yes [provider]  tamsulosin (FLOMAX) 0.4 MG CAPS capsule Take 0.4 mg by mouth at bedtime.  12/03/16  Yes [provider]  varenicline (CHANTIX STARTING MONTH PAK) 0.5 MG X 11 & 1 MG X 42 tablet Take 1 tablet by mouth 2 (two) times daily. 06/16/17  Yes [provider]  bacitracin ointment Apply 1 application topically 2 (two) times daily. Patient not taking: Reported on 06/23/2017 04/22/17   Jola Schmidt, MD  diazepam (VALIUM) 5 MG tablet Take 1 tablet (5 mg total) by mouth every 6 (six) hours as needed for muscle spasms. 01/31/17   Gary Fleet, PA-C  oxyCODONE-acetaminophen (PERCOCET/ROXICET) 5-325 MG tablet Take 1 tablet by mouth every 4 (four) hours as needed for severe pain. Patient not taking: Reported on 06/23/2017 01/31/17   Gary Fleet, PA-C    Physical Exam: Vitals:   06/23/17 1600 06/23/17 1615 06/23/17 1630 06/23/17 1634  BP: 122/70 125/70 101/63 117/67  Pulse:  (!) 50  77  Resp: 11 14 17  (!) 28  Temp:      TempSrc:      SpO2: 100% 100%  100%  Weight:      Height:        Constitutional: NAD, calm, comfortable Vitals:   06/23/17 1600 06/23/17 1615 06/23/17 1630 06/23/17 1634  BP: 122/70 125/70 101/63 117/67  Pulse:  (!) 50  77  Resp: 11 14 17  (!) 28  Temp:      TempSrc:      SpO2: 100% 100%  100%  Weight:      Height:       Eyes: PERRL, lids and conjunctivae normal ENMT: Mucous membranes are Dry. Posterior pharynx clear of any exudate or lesions.Normal dentition.  Neck: normal, supple, no masses, no thyromegaly Respiratory: Bilateral decreased breath sounds at bases. No wheezing or crackles  Cardiovascular: S1 and S2 positive; intermittent bradycardia, no murmurs / rubs / gallops. No extremity edema.  Abdomen: no tenderness, no masses palpated. No hepatosplenomegaly. Bowel sounds positive.  Musculoskeletal: no clubbing / cyanosis. No joint deformity upper and lower extremities.  Skin: no rashes, lesions, ulcers.  No induration Neurologic: CN 2-12 grossly intact. Sensation intact, Strength 4+/5 in all 4.  Lymph: No cervical lymphadenopathy   Labs on Admission: I have personally reviewed following labs and imaging studies  CBC:  Recent Labs Lab 06/23/17 1500  WBC 12.8*  NEUTROABS 8.4*  HGB 11.7*  HCT 36.9*  MCV 78.0  PLT 626   Basic Metabolic Panel:  Recent Labs Lab 06/23/17 1500  NA 133*  K 4.5  CL 99*  CO2 22  GLUCOSE 124*  BUN 43*  CREATININE 2.06*  CALCIUM 9.6   GFR: Estimated Creatinine Clearance: 32.9 mL/min (A) (by C-G formula based on SCr of 2.06 mg/dL (H)). Liver Function Tests: No results for input(s): AST, ALT, ALKPHOS, BILITOT, PROT, ALBUMIN in the last 168 hours. No results for input(s): LIPASE, AMYLASE in the last 168 hours. No results for input(s): AMMONIA in the last 168 hours. Coagulation Profile: No results for input(s): INR, PROTIME in the last 168 hours. Cardiac Enzymes:  Recent Labs Lab 06/23/17 1500  TROPONINI <0.03   BNP (last 3 results) No results for input(s): PROBNP in the last 8760 hours. HbA1C: No results for input(s): HGBA1C in the last 72 hours. CBG: No results for input(s): GLUCAP in the last 168 hours. Lipid Profile: No results for input(s): CHOL, HDL, LDLCALC, TRIG, CHOLHDL, LDLDIRECT in the last 72 hours. Thyroid Function Tests: No results for input(s): TSH, T4TOTAL, FREET4, T3FREE, THYROIDAB in the last 72 hours. Anemia Panel: No results for input(s): VITAMINB12, FOLATE, FERRITIN, TIBC, IRON, RETICCTPCT in the last 72 hours. Urine analysis:    Component Value Date/Time   COLORURINE YELLOW 01/21/2017 [place] 01/21/2017 1126   LABSPEC 1.010 01/21/2017 1126   PHURINE 6.0 01/21/2017 1126   GLUCOSEU NEGATIVE 01/21/2017 1126   [place] 01/21/2017 [place] 01/21/2017 [place] 01/21/2017 [place] 01/21/2017 1126   UROBILINOGEN 1.0 02/22/2015 0905    NITRITE NEGATIVE 01/21/2017 1126   LEUKOCYTESUR MODERATE (A) 01/21/2017 1126    Radiological Exams on Admission: Dg Chest Port 1 View  Result Date: 06/23/2017 CLINICAL DATA:  Mental status changes. EXAM: PORTABLE CHEST 1 VIEW COMPARISON:  01/28/2017 FINDINGS: 1459 hours. Cardiopericardial silhouette is at upper limits of normal for size. Lungs are hyperexpanded. Interstitial markings are diffusely coarsened with chronic features. No edema or focal airspace consolidation. No pleural effusion or pneumothorax. The visualized bony structures of the thorax are intact. Telemetry leads overlie the chest. IMPRESSION: Hyperexpansion with borderline cardiomegaly. No acute cardiopulmonary findings. Electronically Signed   By: Misty Stanley M.D.   On: 06/23/2017 15:19    EKG: Reviewed by myself. Sinus bradycardia at 45 bpm. No ST elevations or depression  Assessment/Plan Principal Problem:   Chest pain Active Problems:   Dehydration   AKI (acute kidney injury) ([place])    Chest pain - In a patient with history of risk factors including tobacco abuse, hypertension, dyslipidemia, COPD - Recycle cardiac enzymes, 2-D echo. Continue aspirin. Check lipid profile. - Patient might need cardiology evaluation if persistent chest pain and/or if positive troponins.  Acute kidney injury probably secondary to dehydration - IV fluids normal saline at 125 mL an hour. Repeat creatinine in a.m. Renal ultrasound. Hold losartan. Nephrology evaluation if renal function worsens. - Urinalysis  Hypertension - Monitor blood pressure. Hold losartan  Tobacco abuse - Counsel about tobacco cessation  History of COPD - Currently stable  Leukocytosis - Probably reactive. Repeat a.m. Labs  History of BPH - Continue Flomax    DVT prophylaxis: Heparin subcutaneous Code Status: Full  Family Communication: None at bedside Disposition Plan: Home in 1-2 days if condition improves Consults called: None  Admission  status: Observation status at telemetry  Severity of Illness: The appropriate patient status for this patient is OBSERVATION. Observation status is judged to be reasonable and necessary in order to provide the required intensity of service to ensure the patient's safety. The patient's presenting symptoms, physical  exam findings, and initial radiographic and laboratory data in the context of their medical condition is felt to place them at decreased risk for further clinical deterioration. Furthermore, it is anticipated that the patient will be medically stable for discharge from the hospital within 2 midnights of admission. The following factors support the patient status of observation.   " The patient's presenting symptoms include chest pain. " The physical exam findings include dehydration " The initial radiographic and laboratory data are acute kidney injury     Aline August MD [facility] Pager [phone]  If 7PM-7AM, please contact night-coverage [email or web address] Password [email or web address]  06/23/2017, 5:25 PM

## 2017-06-23 NOTE — ED Notes (Signed)
NO diaphoresis noted after reeval. Pt stets this happens multiple times a day due to bladder cancer treatment.

## 2017-06-23 NOTE — Progress Notes (Signed)
Patient has refused to get his 2nd troponin lab drawn. Patient was educated on importance of getting labs drawn and still refused. Patient stated that his arms were hurting from all the sticks that he has been getting for blood draws. Will continue to monitor and try again later.

## 2017-06-23 NOTE — ED Triage Notes (Signed)
Pt arrived by [facility]. Per family, pt has been sitting on his porch all day, was fine this am and now states that he is altered, weak and not acting like himself. Is responsive to verbal stimuli. Per ems, pt was orthostatic pta, unable to get iv access, cbg 120.

## 2017-06-23 NOTE — ED Provider Notes (Addendum)
[facility] DEPT Provider Note   CSN: [id number] Arrival date & time: 06/23/17  1432     History   Chief Complaint Chief Complaint  Patient presents with  . Altered Mental Status  Patient has vague historian  HPI Russell [name] is a 76 y.o. male.Patient brought by EMS he complained of generalized weakness earlier today with anterior chest pain lasting several minutes which has resolved. Symptoms onset while seated on his porch. He also reports that he had bilateral arm pain earlier today. He is presently asymptomatic. No treatment prior to coming here. EMS attempted IV access, without success and he denies any shortness of breath nausea or sweatiness  HPI  Past Medical History:  Diagnosis Date  . Arthritis    SHOUDLERS, LUMBAR  . BPH with obstruction/lower urinary tract symptoms   . BPH with urinary obstruction 01/15/2016  . COPD (chronic obstructive pulmonary disease) ([place])   . Coronary atherosclerosis    denied knowledge of this 10/27/16  . Depression   . Diverticulosis of colon   . Dyspnea   . ED (erectile dysfunction)   . Full dentures   . GERD (gastroesophageal reflux disease)    not current  . Glaucoma   . History of gastritis   . History of scabies    2008  . 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 History of syncope    03-22-2014  DX  VAGAL RESPONSE  . Hyperlipidemia, mixed   . Hypertension   . Mitral regurgitation   . Peripheral arterial disease ([place])    one vessel runoff bilaterally via peroneal arteries  . Prostate cancer ([place])   . Seizures ([place]) 03/2014   ? or syncope- patient refused to be admitted for further studies- "felt better"  . Type 2 diabetes, diet controlled ([place])    patient and his wife said no- have not been told 01/21/17  . Wears glasses     Patient Active Problem List   Diagnosis Date Noted  . Acute respiratory insufficiency   . Surgery, elective   . Radiculopathy 01/27/2017  . Cervical radiculopathy  01/27/2017  . Peripheral arterial disease ([place]) 01/23/2016  . Fever 02/22/2015  . Epididymitis, left 02/22/2015  . Cellulitis 02/22/2015  . Orchitis 02/22/2015  . Testicular abscess 02/22/2015  . DM type 2 (diabetes mellitus, type 2) ([place]) 02/22/2015  . Microcytic anemia 02/22/2015  . Epididymitis 02/22/2015  . UTI (lower urinary tract infection) 02/22/2015  . Hypotension 03/22/2014  . Neuroma digital nerve 09/28/2013  . 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 DEHYDRATION 07/03/2010  . NAUSEA AND VOMITING 07/03/2010  . BENIGN PROSTATIC HYPERTROPHY, WITH URINARY OBSTRUCTION 06/13/2010  . ARM PAIN 06/03/2010  . DYSPNEA ON EXERTION 05/15/2010  . TOBACCO ABUSE 04/03/2010  . COPD 04/03/2010  . G E R D 04/03/2010  . OTHER DYSPHAGIA 04/03/2010  . PNEUMONIA, RIGHT LOWER LOBE 02/19/2010  . DIABETES MELLITUS, TYPE II, CONTROLLED 01/14/2010  . LUMBAR RADICULOPATHY, LEFT 05/16/2009  . DYSPHAGIA UNSPECIFIED 05/16/2009  . COUGH DUE TO ACE INHIBITORS 04/02/2009  . DIZZINESS 07/27/2008  . HYPERCHOLESTEROLEMIA, MIXED 05/11/2008  . CATARACTS, BILATERAL 03/01/2008  . MILD SPINAL STENOSIS, CERVICAL 01/12/2008  . LEG CRAMPS 01/12/2008  . EARLY SATIETY 11/04/2007  . DIVERTICULOSIS, COLON 08/31/2007  . SCABIES 08/26/2007  . ERECTILE DYSFUNCTION 08/18/2007  . ABUSE, ALCOHOL SUSPECTED 08/18/2007  . Essential hypertension 08/17/2007  . HEMOCCULT POSITIVE STOOL 07/05/2007  . ANEMIA, IRON DEFICIENCY  NOS 05/31/2007  . WEIGHT LOSS, ABNORMAL 05/10/2006    Past Surgical History:  Procedure Laterality Date  . ANTERIOR CERVICAL DECOMP/DISCECTOMY FUSION  10-16-2009   C4 -- C6  . ANTERIOR CERVICAL DECOMP/DISCECTOMY FUSION N/A 01/27/2017   Procedure: ANTERIOR CERVICAL DECOMPRESSION FUSION CERVICAL 3-4 WITH INSTRUMENTATION AND ALLOGRAFT;  Surgeon: Phylliss Bob, MD;  Location: [facility];  Service: Orthopedics;  Laterality: N/A;  ANTERIOR CERVICAL DECOMPRESSION FUSION CERVICAL 3-4 WITH  INSTRUMENTATION AND ALLOGRAFT  . CAPSULOTOMY Right 01/26/2013   Procedure: MINOR CAPSULOTOMY;  Surgeon: Myrtha Mantis., MD;  Location: [facility];  Service: Ophthalmology;  Laterality: Right;  . CARPECTOMY Right 03/13/2014   Procedure: RIGHT  PROXIMAL ROW CARPECTOMY;  Surgeon: Tennis Must, MD;  Location: [facility];  Service: Orthopedics;  Laterality: Right;  . CARPECTOMY Left 10/22/2015   Procedure: LEFT WRIST PROXIMAL ROW CARPECTOMY ;  Surgeon: Leanora Cover, MD;  Location: [facility];  Service: Orthopedics;  Laterality: Left;  . CARPOMETACARPAL ([place]) FUSION OF THUMB Right 03/13/2014   Procedure: RIGHT FUSION OF THUMB CARPOMETACARPAL [facility]) JOINT;  Surgeon: Tennis Must, MD;  Location: [facility];  Service: Orthopedics;  Laterality: Right;  . CATARACT EXTRACTION W/ INTRAOCULAR LENS  IMPLANT, BILATERAL  2009  . COLONOSCOPY  08-31-2007  . COLONOSCOPY    . CRYOABLATION N/A 01/14/2015   Procedure: CRYO ABLATION PROSTATE;  Surgeon: Ailene Rud, MD;  Location: [facility];  Service: Urology;  Laterality: N/A;  . ESOPHAGOGASTRODUODENOSCOPY  last one 09-11-2008  . EXCISION MASS LEFT FACE , SUBORBITAL AREA, PLASTER RECONSTRUCTION  02-09-2011  . EXCISIONAL DEBRIDEMENT AND REPAIR RIGHT QUADRICEP TENDON  07-05-2000  . FOOT SURGERY Left   . I&D EXTREMITY Right 05/24/2013   Procedure: RIGHT INDEX WOUND DEBRIDEMENT AND CLOSURE;  Surgeon: Jolyn Nap, MD;  Location: [facility];  Service: Orthopedics;  Laterality: Right;  . INSERTION OF SUPRAPUBIC CATHETER N/A 01/14/2015   Procedure: SUPRAPUBIC TUBE PLACEMENT;  Surgeon: Ailene Rud, MD;  Location[facility];  Service: Urology;  Laterality: N/A;  . KNEE ARTHROSCOPY Right 2008  . LARYNGOSCOPY AND ESOPHAGOSCOPY  06-13-2010   MARSUPIALIZATION LEFT LARGE VALLECULAR CYST  . MASS EXCISION Left 10/22/2015   Procedure: EXCISION MASS OF LEFT INDEX FINGER;   Surgeon: Leanora Cover, MD;  Location: [facility];  Service: Orthopedics;  Laterality: Left;  . ORCHIECTOMY Left 02/24/2015   Procedure: SCROTAL EXPLORATION WITH LEFT ORCHIECTOMY;  Surgeon: Kathie Rhodes, MD;  Location: [facility];  Service: Urology;  Laterality: Left;  . POSTERIOR CERVICAL FUSION/FORAMINOTOMY N/A 01/28/2017   Procedure: POSTERIOR SPINAL FUSION CERVICAL 3-4, CERVICAL 4-5, CERVICAL 5-6, CERVICAL 6-7 WITH INSTRUMENATION AND ALLOGRAFT;  Surgeon: Phylliss Bob, MD;  Location: [facility];  Service: Orthopedics;  Laterality: N/A;  POSTERIOR SPINAL FUSION CERVICAL 3-4, CERVICAL 4-5, CERVICAL 5-6, CERVICAL 6-7 WITH INSTRUMENATION AND ALLOGRAFT  . SHOULDER ARTHROSCOPY/ DEBRIDEMENT LABRAL TEAR/  BICEPS TENOTOMY Left 09-24-2011  . TONSILLECTOMY  as child  . TRANSTHORACIC ECHOCARDIOGRAM  02-21-2010   normal LVF/  ef 55-60%/ mild to moderate MR/  moderate LAE/  mild TR  . YAG LASER APPLICATION Right 0/03/5408   Procedure: YAG LASER APPLICATION;  Surgeon: Myrtha Mantis., MD;  Location: [facility];  Service: Ophthalmology;  Laterality: Right;  . YAG LASER CAPSULOTOMY, LEFT EYE  01-08-2011       Home Medications    Prior to Admission medications   Medication Sig Start Date End Date Taking? Authorizing  Provider  albuterol (PROVENTIL HFA;VENTOLIN HFA) 108 (90 BASE) MCG/ACT inhaler Inhale 2 puffs into the lungs every 6 (six) hours as needed for wheezing or shortness of breath. 04/10/13   Dhungel, Nishant, MD  AZOPT 1 % ophthalmic suspension Place 1 drop into the left eye 3 (three) times daily.  12/25/16   [provider]  bacitracin ointment Apply 1 application topically 2 (two) times daily. 04/22/17   Jola Schmidt, MD  bimatoprost (LUMIGAN) 0.01 % SOLN Place 1 drop into both eyes at bedtime. 04/10/13   Dhungel, Nishant, MD  brimonidine-timolol (COMBIGAN) 0.2-0.5 % ophthalmic solution Place 1 drop into both eyes 2 (two) times daily.     [provider]  diazepam (VALIUM) 5 MG  tablet Take 1 tablet (5 mg total) by mouth every 6 (six) hours as needed for muscle spasms. 01/31/17   Gary Fleet, PA-C  gabapentin (NEURONTIN) 300 MG capsule Take 1 capsule by mouth 2 (two) times daily. 10/19/16   [provider]  HYDROcodone-acetaminophen (NORCO) 7.5-325 MG tablet Take 1 tablet by mouth every 8 (eight) hours as needed for moderate pain. pain 12/02/15   [provider]  losartan (COZAAR) 100 MG tablet Take 1 tablet by mouth daily.  10/19/16   [provider]  Multiple Vitamins-Minerals (CENTRUM SILVER ADULT 50+ PO) Take 1 tablet by mouth daily.    [provider]  oxybutynin (DITROPAN) 5 MG tablet TAKE 1 TABLET EVERY 12 HOURS    [provider]  oxyCODONE-acetaminophen (PERCOCET/ROXICET) 5-325 MG tablet Take 1 tablet by mouth every 4 (four) hours as needed for severe pain. 01/31/17   Gary Fleet, PA-C  pravastatin (PRAVACHOL) 80 MG tablet Take 1 tablet by mouth every evening. 10/19/16   [provider]  tamsulosin (FLOMAX) 0.4 MG CAPS capsule Take 0.4 mg by mouth at bedtime.  12/03/16   [provider]    Family History Family History  Problem Relation Age of Onset  . Unexplained death Mother        died at a young age, no known cause  . Heart attack Father        age unknown  . Diabetes Unknown   . Cancer Unknown     Social History Social History  Substance Use Topics  . Smoking status: Current Every Day Smoker    Packs/day: 1.00    Years: 59.00    Types: Cigarettes    Last attempt to quit: 09/20/2016  . Smokeless tobacco: Never Used     Comment: Started Chantix 10/26/16  . Alcohol use No     Comment: none since approx 2014- heavy before     Allergies   No known allergies   Review of Systems Review of Systems  HENT: Negative.   Respiratory: Negative.   Cardiovascular: Positive for chest pain.  Gastrointestinal: Negative.   Musculoskeletal: Negative.   Skin: Negative.     Allergic/Immunologic: Positive for immunocompromised state.       Diabetic  Neurological: Positive for weakness.       Generalized weakness  Psychiatric/Behavioral: Negative.   All other systems reviewed and are negative.    Physical Exam Updated Vital Signs BP 132/60 (BP Location: Left Arm)   Pulse (!) 46   Temp (!) 97.3 F (36.3 C) (Oral)   Resp 16   SpO2 100%   Physical Exam  Constitutional: He appears well-developed and well-nourished.  HENT:  Head: Normocephalic and atraumatic.  Eyes: Conjunctivae are normal. Pupils are equal, round, and reactive to  light.  Neck: Neck supple. No tracheal deviation present. No thyromegaly present.  Cardiovascular: Regular rhythm.   No murmur heard. Bradycardic  Pulmonary/Chest: Effort normal and breath sounds normal.  Abdominal: Soft. Bowel sounds are normal. He exhibits no distension. There is no tenderness.  Musculoskeletal: Normal range of motion. He exhibits no edema or tenderness.  Neurological: He is alert. Coordination normal.  Skin: Skin is warm and dry. No rash noted.  Psychiatric: He has a normal mood and affect.  Nursing note and vitals reviewed.    ED Treatments / Results  Labs (all labs ordered are listed, but only abnormal results are displayed) Labs Reviewed  CBC WITH DIFFERENTIAL/PLATELET  BASIC METABOLIC PANEL  TROPONIN I    EKG  EKG Interpretation  Date/Time:  Wednesday June 23 2017 14:45:10 EDT Ventricular Rate:  45 PR Interval:    QRS Duration: 118 QT Interval:  493 QTC Calculation: 427 R Axis:   47 Text Interpretation:  Sinus bradycardia Incomplete left bundle branch block Left ventricular hypertrophy Since last tracing rate slower Confirmed by Orlie Dakin [phone]) on 06/23/2017 2:50:34 PM       Radiology No results found.  Procedures Procedures (including critical care time)  Medications Ordered in ED Medications  aspirin chewable tablet 324 mg (not administered)   Chest x-ray viewed  by me Results for orders placed or performed during the hospital encounter of 06/23/17  CBC with Differential/Platelet  Result Value Ref Range   WBC 12.8 (H) 4.0 - 10.5 K/uL   RBC 4.73 4.22 - 5.81 MIL/uL   Hemoglobin 11.7 (L) 13.0 - 17.0 g/dL   HCT 36.9 (L) 39.0 - 52.0 %   MCV 78.0 78.0 - 100.0 fL   MCH 24.7 (L) 26.0 - 34.0 pg   MCHC 31.7 30.0 - 36.0 g/dL   RDW 17.7 (H) 11.5 - 15.5 %   Platelets 330 150 - 400 K/uL   Neutrophils Relative % 66 %   Neutro Abs 8.4 (H) 1.7 - 7.7 K/uL   Lymphocytes Relative 26 %   Lymphs Abs 3.3 0.7 - 4.0 K/uL   Monocytes Relative 7 %   Monocytes Absolute 0.9 0.1 - 1.0 K/uL   Eosinophils Relative 1 %   Eosinophils Absolute 0.1 0.0 - 0.7 K/uL   Basophils Relative 0 %   Basophils Absolute 0.1 0.0 - 0.1 K/uL  Basic metabolic panel  Result Value Ref Range   Sodium 133 (L) 135 - 145 mmol/L   Potassium 4.5 3.5 - 5.1 mmol/L   Chloride 99 (L) 101 - 111 mmol/L   CO2 22 22 - 32 mmol/L   Glucose, Bld 124 (H) 65 - 99 mg/dL   BUN 43 (H) 6 - 20 mg/dL   Creatinine, Ser 2.06 (H) 0.61 - 1.24 mg/dL   Calcium 9.6 8.9 - 10.3 mg/dL   GFR calc non Af Amer 30 (L) >60 mL/min   GFR calc Af Amer 34 (L) >60 mL/min   Anion gap 12 5 - 15  Troponin I  Result Value Ref Range   Troponin I <0.03 <0.03 ng/mL   Dg Chest Port 1 View  Result Date: 06/23/2017 CLINICAL DATA:  Mental status changes. EXAM: PORTABLE CHEST 1 VIEW COMPARISON:  01/28/2017 FINDINGS: 1459 hours. Cardiopericardial silhouette is at upper limits of normal for size. Lungs are hyperexpanded. Interstitial markings are diffusely coarsened with chronic features. No edema or focal airspace consolidation. No pleural effusion or pneumothorax. The visualized bony structures of the thorax are intact. Telemetry leads overlie the  chest. IMPRESSION: Hyperexpansion with borderline cardiomegaly. No acute cardiopulmonary findings. Electronically Signed   By: Misty Stanley M.D.   On: 06/23/2017 15:19    Initial Impression /  Assessment and Plan / ED Course  I have reviewed the triage vital signs and the nursing notes.  Pertinent labs & imaging results that were available during my care of the patient were reviewed by me and considered in my medical decision making (see chart for details).     4:45 PM patient remains asymptomatic. Aspirin ordered. Intravenous hydration ordered as patient has renal insufficiency.Rene Paci consulted hospitalist service who will arrange for overnight stay Heart score equals 4  Patient is a clear sensorium detected no evidence of altered mental status Final Clinical Impressions(s) / ED Diagnoses  Diagnosis #1 chest pain at rest #2 renal insufficiency Final diagnoses:  None    New Prescriptions New Prescriptions   No medications on file     Orlie Dakin, MD 06/23/17 [place], MD 06/23/17 [phone]

## 2017-06-24 ENCOUNTER — Encounter (HOSPITAL_COMMUNITY): Payer: Self-pay | Admitting: General Practice

## 2017-06-24 ENCOUNTER — Observation Stay (HOSPITAL_BASED_OUTPATIENT_CLINIC_OR_DEPARTMENT_OTHER): Payer: Medicare Other

## 2017-06-24 DIAGNOSIS — N390 Urinary tract infection, site not specified: Secondary | ICD-10-CM | POA: Diagnosis not present

## 2017-06-24 DIAGNOSIS — I200 Unstable angina: Secondary | ICD-10-CM | POA: Diagnosis not present

## 2017-06-24 DIAGNOSIS — E860 Dehydration: Secondary | ICD-10-CM | POA: Diagnosis not present

## 2017-06-24 DIAGNOSIS — N179 Acute kidney failure, unspecified: Secondary | ICD-10-CM | POA: Diagnosis not present

## 2017-06-24 DIAGNOSIS — I208 Other forms of angina pectoris: Secondary | ICD-10-CM | POA: Diagnosis not present

## 2017-06-24 LAB — CBC WITH DIFFERENTIAL/PLATELET
BASOS PCT: 1 %
Basophils Absolute: 0.1 10*3/uL (ref 0.0–0.1)
EOS ABS: 0.1 10*3/uL (ref 0.0–0.7)
Eosinophils Relative: 1 %
HCT: 33.5 % — ABNORMAL LOW (ref 39.0–52.0)
HEMOGLOBIN: 10.4 g/dL — AB (ref 13.0–17.0)
Lymphocytes Relative: 45 %
Lymphs Abs: 3.9 10*3/uL (ref 0.7–4.0)
MCH: 24.5 pg — ABNORMAL LOW (ref 26.0–34.0)
MCHC: 31 g/dL (ref 30.0–36.0)
MCV: 78.8 fL (ref 78.0–100.0)
Monocytes Absolute: 0.6 10*3/uL (ref 0.1–1.0)
Monocytes Relative: 7 %
NEUTROS PCT: 46 %
Neutro Abs: 3.9 10*3/uL (ref 1.7–7.7)
Platelets: 313 10*3/uL (ref 150–400)
RBC: 4.25 MIL/uL (ref 4.22–5.81)
RDW: 18.1 % — ABNORMAL HIGH (ref 11.5–15.5)
WBC: 8.4 10*3/uL (ref 4.0–10.5)

## 2017-06-24 LAB — COMPREHENSIVE METABOLIC PANEL
ALBUMIN: 3.3 g/dL — AB (ref 3.5–5.0)
ALK PHOS: 54 U/L (ref 38–126)
ALT: 16 U/L — ABNORMAL LOW (ref 17–63)
ANION GAP: 11 (ref 5–15)
AST: 18 U/L (ref 15–41)
BUN: 29 mg/dL — ABNORMAL HIGH (ref 6–20)
CALCIUM: 8.7 mg/dL — AB (ref 8.9–10.3)
CO2: 20 mmol/L — AB (ref 22–32)
CREATININE: 1.23 mg/dL (ref 0.61–1.24)
Chloride: 108 mmol/L (ref 101–111)
GFR calc Af Amer: >60 mL/min (ref >60)
GFR calc non Af Amer: 55 mL/min — ABNORMAL LOW (ref >60)
GLUCOSE: 146 mg/dL — AB (ref 65–99)
Potassium: 4.5 mmol/L (ref 3.5–5.1)
SODIUM: 139 mmol/L (ref 135–145)
Total Bilirubin: 0.3 mg/dL (ref 0.3–1.2)
Total Protein: 6.1 g/dL — ABNORMAL LOW (ref 6.5–8.1)

## 2017-06-24 LAB — MAGNESIUM: Magnesium: 2.4 mg/dL (ref 1.7–2.4)

## 2017-06-24 LAB — LIPID PANEL
CHOL/HDL RATIO: 4.1 ratio
CHOLESTEROL: 143 mg/dL (ref 0–200)
HDL: 35 mg/dL — ABNORMAL LOW (ref >40)
LDL Cholesterol: 55 mg/dL (ref 0–99)
Triglycerides: 264 mg/dL — ABNORMAL HIGH (ref <150)
VLDL: 53 mg/dL — ABNORMAL HIGH (ref 0–40)

## 2017-06-24 LAB — ECHOCARDIOGRAM COMPLETE
HEIGHTINCHES: 74 in
WEIGHTICAEL: 2480 [oz_av]

## 2017-06-24 LAB — TROPONIN I: Troponin I: <0.03 ng/mL (ref <0.03)

## 2017-06-24 MED ORDER — DEXTROSE 5 % IV SOLN
1.0000 g | INTRAVENOUS | Status: DC
  Administered 2017-06-24 – 2017-06-25 (×2): 1 g via INTRAVENOUS
  Filled 2017-06-24 (×2): qty 10

## 2017-06-24 NOTE — Progress Notes (Signed)
PT Cancellation/Discharge Note  Patient Details Name: Russell [name] MRN: [id number] DOB: 04-10-1941   Cancelled Treatment:    Reason Eval/Treat Not Completed: Patient declined, no reason specified.  Patient reports he is up in the room and doesn't need PT.  When educated on importance of mobility, patient stated "I'm not walking anywhere now".  PT will sign off.   Despina Pole 06/24/2017, 4:05 PM Carita Pian Sanjuana Kava, [place] Pager [phone]

## 2017-06-24 NOTE — Progress Notes (Signed)
Patient ID: Russell [name], male   DOB: Oct 23, 1941, 76 y.o.   MRN: [id number]  PROGRESS NOTE    Russell [name]  NID:[id number] DOB: Oct 11, 1941 DOA: 06/23/2017 PCP: Rogers Blocker, MD   Brief Narrative:  75 y.o. male with medical history significant of hypertension, dyslipidemia, COPD, tobacco abuse, GERD, depression, glaucoma, mitral valve prolapse with mitral regurgitation, peripheral arterial disease, bradycardia and syncope presented with complaints of anterior chest wall pain. He was found to have acute kidney injury and started on IV fluids.   Assessment & Plan:   Principal Problem:   Chest pain Active Problems:   Dehydration   AKI (acute kidney injury) ([place])  Chest pain - In a patient with history of risk factors including tobacco abuse, hypertension, dyslipidemia, COPD - Troponins negative so far. Follow 2-D echo, Continue aspirin. - Currently chest pain-free. Patient might need outpatient stress test and cardiology evaluation.  Acute kidney injury probably secondary to dehydration -Continue IV fluids. Follow repeat creatinine for today and tomorrow. Keep holding losartan. - Renal ultrasound was negative for hydronephrosis  Probable UTI - Present on admission. Start Rocephin. Follow urine cultures.  Hypertension - Monitor blood pressure. Hold losartan  Tobacco abuse - Counsel about tobacco cessation  History of COPD - Currently stable  Leukocytosis - Continue Rocephin. Follow AM labs  History of BPH - Continue Flomax. Patient might need outpatient urology follow-up    DVT prophylaxis: Heparin subcutaneous Code Status:  Full Family Communication: None present at bedside Disposition Plan: Home in 1-2 days if condition improves  Consultants: None  Procedures: Echo pending  Antimicrobials: None   Subjective: Patient seen and examined at bedside. He denies any active chest pain, nausea, vomiting. No overnight fevers.  Objective: Vitals:   06/23/17 1815 06/23/17 1841 06/24/17 0300 06/24/17 0440  BP: 124/67 135/69  134/65  Pulse:  (!) 59  60  Resp: 16 16  16   Temp:  98.3 F (36.8 C)  98 F (36.7 C)  TempSrc:  Oral  Oral  SpO2:  97%  98%  Weight:  70.7 kg (155 lb 14.4 oz) 70.3 kg (155 lb)   Height:  6\' 2"  (1.88 m)      Intake/Output Summary (Last 24 hours) at 06/24/17 0942 Last data filed at 06/24/17 1443  Gross per 24 hour  Intake          1665.75 ml  Output              900 ml  Net           765.75 ml   Filed Weights   06/23/17 1512 06/23/17 1841 06/24/17 0300  Weight: 76.2 kg (168 lb) 70.7 kg (155 lb 14.4 oz) 70.3 kg (155 lb)    Examination:  General exam: Appears calm and comfortable  Respiratory system: Bilateral decreased breath sound at bases Cardiovascular system: S1 & S2 heard, Intermittent bradycardia  Gastrointestinal system: Abdomen is nondistended, soft and nontender. Normal bowel sounds heard. Extremities: No cyanosis, clubbing, edema    Data Reviewed: I have personally reviewed following labs and imaging studies  CBC:  Recent Labs Lab 06/23/17 1500  WBC 12.8*  NEUTROABS 8.4*  HGB 11.7*  HCT 36.9*  MCV 78.0  PLT 154   Basic Metabolic Panel:  Recent Labs Lab 06/23/17 1500  NA 133*  K 4.5  CL 99*  CO2 22  GLUCOSE 124*  BUN 43*  CREATININE 2.06*  CALCIUM 9.6   GFR: Estimated Creatinine Clearance: 30.3 mL/min (A) (by  C-G formula based on SCr of 2.06 mg/dL (H)). Liver Function Tests: No results for input(s): AST, ALT, ALKPHOS, BILITOT, PROT, ALBUMIN in the last 168 hours. No results for input(s): LIPASE, AMYLASE in the last 168 hours. No results for input(s): AMMONIA in the last 168 hours. Coagulation Profile: No results for input(s): INR, PROTIME in the last 168 hours. Cardiac Enzymes:  Recent Labs Lab 06/23/17 1500 06/23/17 2229 06/24/17 0211  TROPONINI <0.03 <0.03 <0.03   BNP (last 3 results) No results for input(s): PROBNP in the last 8760 hours. HbA1C: No  results for input(s): HGBA1C in the last 72 hours. CBG: No results for input(s): GLUCAP in the last 168 hours. Lipid Profile:  Recent Labs  06/24/17 0211  CHOL 143  HDL 35*  LDLCALC 55  TRIG 264*  CHOLHDL 4.1   Thyroid Function Tests: No results for input(s): TSH, T4TOTAL, FREET4, T3FREE, THYROIDAB in the last 72 hours. Anemia Panel: No results for input(s): VITAMINB12, FOLATE, FERRITIN, TIBC, IRON, RETICCTPCT in the last 72 hours. Sepsis Labs: No results for input(s): PROCALCITON, LATICACIDVEN in the last 168 hours.  No results found for this or any previous visit (from the past 240 hour(s)).       Radiology Studies: US Renal  Result Date: 06/23/2017 CLINICAL DATA:  Acute onset of renal insufficiency. Initial encounter. EXAM: RENAL / URINARY TRACT ULTRASOUND COMPLETE COMPARISON:  PET/CT performed 11/28/2014 FINDINGS: Right Kidney: Length: 10.5 cm. Echogenicity within normal limits. A 1.6 cm cyst is noted at the lower pole of the right kidney. No hydronephrosis visualized. Left Kidney: Length: 11.0 cm. Echogenicity within normal limits. No mass or hydronephrosis visualized. Bladder: Appears normal for degree of bladder distention. Bilateral ureteral jets are visualized. There is significant nodular impression on the base of the bladder by the patient's prostate. The patient has known prostate cancer. IMPRESSION: 1. No evidence of hydronephrosis. 2. 1.6 cm right renal cyst. 3. Significant nodular impression on the base of the bladder by the patient's prostate. The patient has known prostate cancer. Electronically Signed   By: Garald Balding M.D.   On: 06/23/2017 21:40   Dg Chest Port 1 View  Result Date: 06/23/2017 CLINICAL DATA:  Mental status changes. EXAM: PORTABLE CHEST 1 VIEW COMPARISON:  01/28/2017 FINDINGS: 1459 hours. Cardiopericardial silhouette is at upper limits of normal for size. Lungs are hyperexpanded. Interstitial markings are diffusely coarsened with chronic features.  No edema or focal airspace consolidation. No pleural effusion or pneumothorax. The visualized bony structures of the thorax are intact. Telemetry leads overlie the chest. IMPRESSION: Hyperexpansion with borderline cardiomegaly. No acute cardiopulmonary findings. Electronically Signed   By: Misty Stanley M.D.   On: 06/23/2017 15:19        Scheduled Meds: . aspirin EC  81 mg Oral Daily  . brimonidine  1 drop Both Eyes BID   Or  . timolol  1 drop Both Eyes BID  . brinzolamide  1 drop Left Eye TID  . gabapentin  300 mg Oral BID  . heparin  5,000 Units Subcutaneous Q8H  . oxybutynin  5 mg Oral Q12H  . pravastatin  80 mg Oral Daily  . tamsulosin  0.4 mg Oral QHS   Continuous Infusions: . sodium chloride 125 mL/hr at 06/24/17 0544  . cefTRIAXone (ROCEPHIN)  IV 1 g (06/24/17 0827)     LOS: 0 days        Aline August, MD [facility] Pager [phone]  If 7PM-7AM, please contact night-coverage [email or web address] Password [facility] 06/24/2017, 9:42  AM

## 2017-06-24 NOTE — Progress Notes (Signed)
  Echocardiogram 2D Echocardiogram has been performed.  [name] 06/24/2017, 8:33 AM

## 2017-06-24 NOTE — Care Management Obs Status (Signed)
[facility] NOTIFICATION   Patient Details  Name: Russell [name] MRN: [id number] Date of Birth: March 20, 1941   Medicare Observation Status Notification Given:  Yes    Maryclare Labrador, RN 06/24/2017, 3:52 PM

## 2017-06-25 DIAGNOSIS — E860 Dehydration: Secondary | ICD-10-CM | POA: Diagnosis not present

## 2017-06-25 DIAGNOSIS — R079 Chest pain, unspecified: Secondary | ICD-10-CM | POA: Diagnosis not present

## 2017-06-25 DIAGNOSIS — N179 Acute kidney failure, unspecified: Secondary | ICD-10-CM | POA: Diagnosis not present

## 2017-06-25 LAB — BASIC METABOLIC PANEL
Anion gap: 5 (ref 5–15)
BUN: 20 mg/dL (ref 6–20)
CHLORIDE: 111 mmol/L (ref 101–111)
CO2: 24 mmol/L (ref 22–32)
CREATININE: 0.96 mg/dL (ref 0.61–1.24)
Calcium: 8.7 mg/dL — ABNORMAL LOW (ref 8.9–10.3)
GFR calc Af Amer: >60 mL/min (ref >60)
GFR calc non Af Amer: >60 mL/min (ref >60)
Glucose, Bld: 100 mg/dL — ABNORMAL HIGH (ref 65–99)
Potassium: 4.4 mmol/L (ref 3.5–5.1)
Sodium: 140 mmol/L (ref 135–145)

## 2017-06-25 LAB — CBC WITH DIFFERENTIAL/PLATELET
Basophils Absolute: 0.1 10*3/uL (ref 0.0–0.1)
Basophils Relative: 1 %
EOS ABS: 0.2 10*3/uL (ref 0.0–0.7)
Eosinophils Relative: 2 %
HEMATOCRIT: 32.3 % — AB (ref 39.0–52.0)
HEMOGLOBIN: 9.9 g/dL — AB (ref 13.0–17.0)
LYMPHS ABS: 4 10*3/uL (ref 0.7–4.0)
LYMPHS PCT: 46 %
MCH: 24.2 pg — ABNORMAL LOW (ref 26.0–34.0)
MCHC: 30.7 g/dL (ref 30.0–36.0)
MCV: 79 fL (ref 78.0–100.0)
MONOS PCT: 8 %
Monocytes Absolute: 0.6 10*3/uL (ref 0.1–1.0)
NEUTROS ABS: 3.7 10*3/uL (ref 1.7–7.7)
NEUTROS PCT: 43 %
Platelets: 285 10*3/uL (ref 150–400)
RBC: 4.09 MIL/uL — ABNORMAL LOW (ref 4.22–5.81)
RDW: 18.1 % — ABNORMAL HIGH (ref 11.5–15.5)
WBC: 8.6 10*3/uL (ref 4.0–10.5)

## 2017-06-25 LAB — URINE CULTURE

## 2017-06-25 LAB — MAGNESIUM: Magnesium: 2.4 mg/dL (ref 1.7–2.4)

## 2017-06-25 MED ORDER — CEPHALEXIN 500 MG PO CAPS: 500.0000 mg | ORAL_CAPSULE | Freq: Three times a day (TID) | ORAL | 0 refills | Status: AC

## 2017-06-25 MED ORDER — BRIMONIDINE TARTRATE 0.2 % OP SOLN
1.0000 [drp] | Freq: Two times a day (BID) | OPHTHALMIC | 12 refills | Status: DC
Start: 2017-06-25 — End: 2018-05-11

## 2017-06-25 NOTE — Discharge Summary (Signed)
Physician Discharge Summary  Russell [name] OBS:[id number] DOB: 23-Dec-1940 DOA: 06/23/2017  PCP: Rogers Blocker, MD  Admit date: 06/23/2017 Discharge date: 06/25/2017  Admitted From: Home  Disposition:  Home  Recommendations for Outpatient Follow-up:  1. Follow up with PCP in 1 week 2. Please obtain BMP/CBC in one week 3. Please follow up with cardiology as scheduled  Home Health: No  Equipment/Devices: None  Discharge Condition: Stable CODE STATUS: Full Diet recommendation: Heart Healthy  Brief/Interim Summary: 76 y.o.malewith medical history significant of hypertension, dyslipidemia, COPD, tobacco abuse, GERD, depression, glaucoma, mitral valve prolapse with mitral regurgitation, peripheral arterial disease, bradycardia and syncope presented with complaints of anterior chest wall pain. He was found to have acute kidney injury and started on IV fluids. He was also started on IV Rocephin for urinary tract infection.  Discharge Diagnoses:  Principal Problem:   Chest pain Active Problems:   Dehydration   AKI (acute kidney injury) ([place])  Chest pain - In a patient with history of risk factors including tobacco abuse, hypertension, dyslipidemia, COPD - Troponins negative so far. Echo showed ejection fraction of 60-65% with grade 2 diastolic dysfunction. - Currently chest pain-free.  - Patient has been set up for outpatient cardiology evaluation for need for probable stress test. Advised the patient to keep appointment.  Acute kidney injury probably secondary to dehydration -Resolved with IV fluids - Renal ultrasound was negative for hydronephrosis - Resume losartan on discharge  Probable UTI - Present on admission. Currently on IV Rocephin. Cultures negative so far. Discharge patient on Keflex 500 mg 3 times a day for 5 days.  Hypertension - Monitor blood pressure. Resume losartan  Tobacco abuse - Counselledabout tobacco cessation  History of COPD - Currently  stable  Leukocytosis - Resolved  History of BPH - Continue Flomax. Patient might need outpatient urology follow-up   Discharge Instructions  Discharge Instructions    Call MD for:  difficulty breathing, headache or visual disturbances    Complete by:  As directed    Call MD for:  extreme fatigue    Complete by:  As directed    Call MD for:  hives    Complete by:  As directed    Call MD for:  persistant dizziness or light-headedness    Complete by:  As directed    Call MD for:  persistant nausea and vomiting    Complete by:  As directed    Call MD for:  severe uncontrolled pain    Complete by:  As directed    Call MD for:  temperature >100.4    Complete by:  As directed    Diet - low sodium heart healthy    Complete by:  As directed    Increase activity slowly    Complete by:  As directed      Allergies as of 06/25/2017      Reactions   No Known Allergies       Medication List    STOP taking these medications   bacitracin ointment   diazepam 5 MG tablet Commonly known as:  VALIUM   losartan 100 MG tablet Commonly known as:  COZAAR   oxyCODONE-acetaminophen 5-325 MG tablet Commonly known as:  PERCOCET/ROXICET     TAKE these medications   AZOPT 1 % ophthalmic suspension Generic drug:  brinzolamide Place 1 drop into the left eye 3 (three) times daily.   bimatoprost 0.01 % Soln Commonly known as:  LUMIGAN Place 1 drop into both eyes at  bedtime.   brimonidine 0.2 % ophthalmic solution Commonly known as:  ALPHAGAN Place 1 drop into both eyes 2 (two) times daily.   CENTRUM SILVER ADULT 50+ PO Take 1 tablet by mouth daily.   cephALEXin 500 MG capsule Commonly known as:  KEFLEX Take 1 capsule (500 mg total) by mouth 3 (three) times daily.   CHANTIX STARTING MONTH PAK 0.5 MG X 11 & 1 MG X 42 tablet Generic drug:  varenicline Take 1 tablet by mouth 2 (two) times daily.   COMBIGAN 0.2-0.5 % ophthalmic solution Generic drug:  brimonidine-timolol Place  1 drop into both eyes 2 (two) times daily.   esomeprazole 40 MG capsule Commonly known as:  NEXIUM Take 40 mg by mouth daily.   gabapentin 300 MG capsule Commonly known as:  NEURONTIN Take 300 mg by mouth 2 (two) times daily.   HYDROcodone-acetaminophen 7.5-325 MG tablet Commonly known as:  NORCO Take 1 tablet by mouth every 8 (eight) hours as needed for moderate pain. pain   meclizine 25 MG tablet Commonly known as:  ANTIVERT Take 25 mg by mouth daily.   oxybutynin 5 MG tablet Commonly known as:  DITROPAN TAKE 1 TABLET EVERY 12 HOURS   pravastatin 80 MG tablet Commonly known as:  PRAVACHOL Take 80 mg by mouth daily.   tamsulosin 0.4 MG Caps capsule Commonly known as:  FLOMAX Take 0.4 mg by mouth at bedtime.      Follow-up Information    Isaiah Serge, NP. Go on 07/16/2017.   Specialties:  Cardiology, Radiology Why:  @2 :30 for hospital follow up per IM Contact information: [place] [address] [phone]        Rogers Blocker, MD Follow up in 1 week(s).   Specialty:  Internal Medicine Contact information: [place] [address] [phone]          Allergies  Allergen Reactions  . No Known Allergies     Consultations:  None   Procedures/Studies: US Renal  Result Date: 06/23/2017 CLINICAL DATA:  Acute onset of renal insufficiency. Initial encounter. EXAM: RENAL / URINARY TRACT ULTRASOUND COMPLETE COMPARISON:  PET/CT performed 11/28/2014 FINDINGS: Right Kidney: Length: 10.5 cm. Echogenicity within normal limits. A 1.6 cm cyst is noted at the lower pole of the right kidney. No hydronephrosis visualized. Left Kidney: Length: 11.0 cm. Echogenicity within normal limits. No mass or hydronephrosis visualized. Bladder: Appears normal for degree of bladder distention. Bilateral ureteral jets are visualized. There is significant nodular impression on the base of the bladder by the patient's prostate. The patient  has known prostate cancer. IMPRESSION: 1. No evidence of hydronephrosis. 2. 1.6 cm right renal cyst. 3. Significant nodular impression on the base of the bladder by the patient's prostate. The patient has known prostate cancer. Electronically Signed   By: Garald Balding M.D.   On: 06/23/2017 21:40   Dg Chest Port 1 View  Result Date: 06/23/2017 CLINICAL DATA:  Mental status changes. EXAM: PORTABLE CHEST 1 VIEW COMPARISON:  01/28/2017 FINDINGS: 1459 hours. Cardiopericardial silhouette is at upper limits of normal for size. Lungs are hyperexpanded. Interstitial markings are diffusely coarsened with chronic features. No edema or focal airspace consolidation. No pleural effusion or pneumothorax. The visualized bony structures of the thorax are intact. Telemetry leads overlie the chest. IMPRESSION: Hyperexpansion with borderline cardiomegaly. No acute cardiopulmonary findings. Electronically Signed   By: Misty Stanley M.D.   On: 06/23/2017 15:19    Echo on 06/24/17:  -  Left ventricle: The cavity size was normal. Wall thickness was   increased in a pattern of mild LVH. Systolic function was normal.   The estimated ejection fraction was in the range of 60% to 65%.   Features are consistent with a pseudonormal left ventricular   filling pattern, with concomitant abnormal relaxation and   increased filling pressure (grade 2 diastolic dysfunction). - Mitral valve: Valve area by pressure half-time: 2.27 cm^2.   Subjective: Patient seen and examined at bedside. He denies any overnight fever, nausea, vomiting. He wants to go home.  Discharge Exam: Vitals:   06/24/17 2100 06/25/17 0401  BP: 111/67 125/71  Pulse: 65 (!) 51  Resp: 18 18  Temp: 98 F (36.7 C) 98.3 F (36.8 C)   Vitals:   06/24/17 0440 06/24/17 1405 06/24/17 2100 06/25/17 0401  BP: 134/65 (!) 108/56 111/67 125/71  Pulse: 60 60 65 (!) 51  Resp: 16 18 18 18   Temp: 98 F (36.7 C) 98.4 F (36.9 C) 98 F (36.7 C) 98.3 F (36.8 C)   TempSrc: Oral Oral Oral Oral  SpO2: 98% 100% 100% 100%  Weight:    73.5 kg (162 lb)  Height:        General: Pt is alert, awake, not in acute distress Cardiovascular: Intermittent bradycardia, S1/S2 + Respiratory: Lateral decreased breath sounds at bases Abdominal: Soft, NT, ND, bowel sounds + Extremities: no edema, no cyanosis    The results of significant diagnostics from this hospitalization (including imaging, microbiology, ancillary and laboratory) are listed below for reference.     Microbiology: No results found for this or any previous visit (from the past 240 hour(s)).   Labs: BNP (last 3 results) No results for input(s): BNP in the last 8760 hours. Basic Metabolic Panel:  Recent Labs Lab 06/23/17 1500 06/24/17 0914 06/25/17 0348  NA 133* 139 140  K 4.5 4.5 4.4  CL 99* 108 111  CO2 22 20* 24  GLUCOSE 124* 146* 100*  BUN 43* 29* 20  CREATININE 2.06* 1.23 0.96  CALCIUM 9.6 8.7* 8.7*  MG  --  2.4 2.4   Liver Function Tests:  Recent Labs Lab 06/24/17 0914  AST 18  ALT 16*  ALKPHOS 54  BILITOT 0.3  PROT 6.1*  ALBUMIN 3.3*   No results for input(s): LIPASE, AMYLASE in the last 168 hours. No results for input(s): AMMONIA in the last 168 hours. CBC:  Recent Labs Lab 06/23/17 1500 06/24/17 0914 06/25/17 0348  WBC 12.8* 8.4 8.6  NEUTROABS 8.4* 3.9 3.7  HGB 11.7* 10.4* 9.9*  HCT 36.9* 33.5* 32.3*  MCV 78.0 78.8 79.0  PLT 330 313 285   Cardiac Enzymes:  Recent Labs Lab 06/23/17 1500 06/23/17 2229 06/24/17 0211  TROPONINI <0.03 <0.03 <0.03   BNP: Invalid input(s): POCBNP CBG: No results for input(s): GLUCAP in the last 168 hours. D-Dimer No results for input(s): DDIMER in the last 72 hours. Hgb A1c No results for input(s): HGBA1C in the last 72 hours. Lipid Profile  Recent Labs  06/24/17 0211  CHOL 143  HDL 35*  LDLCALC 55  TRIG 264*  CHOLHDL 4.1   Thyroid function studies No results for input(s): TSH, T4TOTAL, T3FREE,  THYROIDAB in the last 72 hours.  Invalid input(s): FREET3 Anemia work up No results for input(s): VITAMINB12, FOLATE, FERRITIN, TIBC, IRON, RETICCTPCT in the last 72 hours. Urinalysis    Component Value Date/Time   COLORURINE YELLOW 06/23/2017 2241   APPEARANCEUR HAZY (A) 06/23/2017 2241  LABSPEC 1.010 06/23/2017 2241   PHURINE 5.0 06/23/2017 2241   GLUCOSEU NEGATIVE 06/23/2017 2241   HGBUR NEGATIVE 06/23/2017 2241   [place] 06/23/2017 2241   KETONESUR NEGATIVE 06/23/2017 2241   PROTEINUR NEGATIVE 06/23/2017 2241   UROBILINOGEN 1.0 02/22/2015 0905   NITRITE NEGATIVE 06/23/2017 2241   LEUKOCYTESUR LARGE (A) 06/23/2017 2241   Sepsis Labs Invalid input(s): PROCALCITONIN,  WBC,  LACTICIDVEN Microbiology No results found for this or any previous visit (from the past 240 hour(s)).   Time coordinating discharge: 35 minutes  SIGNED:   Aline August, MD  [facility] 06/25/2017, 9:45 AM Pager: [phone]  If 7PM-7AM, please contact night-coverage [email or web address] Password [email or web address]

## 2017-06-25 NOTE — Progress Notes (Signed)
Pt stated he is ready to go home. Discharge instructions reviewed with pt and pt has no questions at this time. Waiting on wife to pick up pt.

## 2017-06-27 ENCOUNTER — Emergency Department (HOSPITAL_COMMUNITY)
Admission: EM | Admit: 2017-06-27 | Discharge: 2017-06-28 | Disposition: A | Discharge status: Home / Self Care | Payer: Medicare Other | Attending: Emergency Medicine | Admitting: Emergency Medicine

## 2017-06-27 ENCOUNTER — Emergency Department (HOSPITAL_COMMUNITY): Payer: Medicare Other

## 2017-06-27 ENCOUNTER — Encounter (HOSPITAL_COMMUNITY): Payer: Self-pay | Admitting: *Deleted

## 2017-06-27 DIAGNOSIS — F17210 Nicotine dependence, cigarettes, uncomplicated: Secondary | ICD-10-CM | POA: Insufficient documentation

## 2017-06-27 DIAGNOSIS — I2510 Atherosclerotic heart disease of native coronary artery without angina pectoris: Secondary | ICD-10-CM | POA: Insufficient documentation

## 2017-06-27 DIAGNOSIS — N3000 Acute cystitis without hematuria: Secondary | ICD-10-CM | POA: Diagnosis not present

## 2017-06-27 DIAGNOSIS — J449 Chronic obstructive pulmonary disease, unspecified: Secondary | ICD-10-CM | POA: Insufficient documentation

## 2017-06-27 DIAGNOSIS — I10 Essential (primary) hypertension: Secondary | ICD-10-CM | POA: Diagnosis not present

## 2017-06-27 DIAGNOSIS — Z79899 Other long term (current) drug therapy: Secondary | ICD-10-CM | POA: Insufficient documentation

## 2017-06-27 DIAGNOSIS — N451 Epididymitis: Secondary | ICD-10-CM | POA: Diagnosis not present

## 2017-06-27 DIAGNOSIS — Z7902 Long term (current) use of antithrombotics/antiplatelets: Secondary | ICD-10-CM | POA: Insufficient documentation

## 2017-06-27 DIAGNOSIS — F329 Major depressive disorder, single episode, unspecified: Secondary | ICD-10-CM | POA: Insufficient documentation

## 2017-06-27 DIAGNOSIS — E119 Type 2 diabetes mellitus without complications: Secondary | ICD-10-CM | POA: Insufficient documentation

## 2017-06-27 DIAGNOSIS — R079 Chest pain, unspecified: Secondary | ICD-10-CM | POA: Diagnosis present

## 2017-06-27 LAB — BASIC METABOLIC PANEL
Anion gap: 10 (ref 5–15)
BUN: 15 mg/dL (ref 6–20)
CALCIUM: 9.1 mg/dL (ref 8.9–10.3)
CO2: 23 mmol/L (ref 22–32)
CREATININE: 1.08 mg/dL (ref 0.61–1.24)
Chloride: 100 mmol/L — ABNORMAL LOW (ref 101–111)
GFR calc Af Amer: >60 mL/min (ref >60)
Glucose, Bld: 128 mg/dL — ABNORMAL HIGH (ref 65–99)
Potassium: 4.3 mmol/L (ref 3.5–5.1)
SODIUM: 133 mmol/L — AB (ref 135–145)

## 2017-06-27 LAB — CBC
HCT: 33.5 % — ABNORMAL LOW (ref 39.0–52.0)
Hemoglobin: 10.5 g/dL — ABNORMAL LOW (ref 13.0–17.0)
MCH: 24.7 pg — ABNORMAL LOW (ref 26.0–34.0)
MCHC: 31.3 g/dL (ref 30.0–36.0)
MCV: 78.8 fL (ref 78.0–100.0)
PLATELETS: 312 10*3/uL (ref 150–400)
RBC: 4.25 MIL/uL (ref 4.22–5.81)
RDW: 17.9 % — AB (ref 11.5–15.5)
WBC: 11.3 10*3/uL — AB (ref 4.0–10.5)

## 2017-06-27 LAB — I-STAT TROPONIN, ED: Troponin i, poc: 0.01 ng/mL (ref 0.00–0.08)

## 2017-06-27 MED ORDER — MORPHINE SULFATE (PF) 4 MG/ML IV SOLN
4.0000 mg | Freq: Once | INTRAVENOUS | Status: AC
  Administered 2017-06-27: 4 mg via INTRAVENOUS
  Filled 2017-06-27: qty 1

## 2017-06-27 NOTE — ED Notes (Signed)
The pt c/o rt groin pain  He was seen here Wednesday for the same  He reports that he has a porta-cath lt upper arm for treatment for bladder cancer

## 2017-06-27 NOTE — ED Notes (Signed)
The pt is asking for ice cream.  No ice cream here no results of his test yet either..  Still having pain at present

## 2017-06-27 NOTE — ED Triage Notes (Signed)
The pt arrived by [facility] from home c/o chest pain ss  For one hour  Non-radiating.  No pain when ems arrived they gave him an aspirin 324mg .    He lives at home with family he is also c/o  Rt groin pain.  Porta-cath for cancer somewhere he does not know

## 2017-06-27 NOTE — ED Notes (Signed)
To ultrasound

## 2017-06-27 NOTE — ED Provider Notes (Signed)
[place] [facility] DEPT Provider Note   CSN: [id number] Arrival date & time: 06/27/17  1935     History   Chief Complaint Chief Complaint  Patient presents with  . Chest Pain    HPI Russell [name] is a 76 y.o. male of BPH, COPD, GERD who presents with resolved chest pain and persistent right groin pain. Patient reports she had about 2 hours of chest pain. He describes the chest pain as left-sided and sharp and nonradiating. Patient reported his groin pain began around the same time. He denied any shortness of breath at the time, however did vomit 2-3 times. He denied any sweating. Patient was recently admitted and discharged for chest pain. He recently had an echocardiogram with a normal ejection fraction. Patient reports having a left scrotal exploration and left orchiectomy surgery in 2016, but does not have a known inguinal hernia. The patient denies any fever, cough, abdominal pain, urinary symptoms, penile pain or discharge. Full dose aspirin given by EMS en route.  HPI  Past Medical History:  Diagnosis Date  . AKI (acute kidney injury) ([place]) 06/23/2017  . Arthritis    SHOUDLERS, LUMBAR  . BPH with obstruction/lower urinary tract symptoms   . BPH with urinary obstruction 01/15/2016  . COPD (chronic obstructive pulmonary disease) ([place])   . Coronary atherosclerosis    denied knowledge of this 10/27/16  . Depression   . Diverticulosis of colon   . Dyspnea   . ED (erectile dysfunction)   . Full dentures   . GERD (gastroesophageal reflux disease)    not current  . Glaucoma   . History of gastritis   . History of scabies    2008  . 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 History of syncope    03-22-2014  DX  VAGAL RESPONSE  . Hyperlipidemia, mixed   . Hypertension   . Mitral regurgitation   . Peripheral arterial disease ([place])    one vessel runoff bilaterally via peroneal arteries  . Prostate cancer ([place])   . Seizures ([place]) 03/2014   ? or syncope-  patient refused to be admitted for further studies- "felt better"  . Type 2 diabetes, diet controlled ([place])    patient and his wife said no- have not been told 01/21/17  . Wears glasses     Patient Active Problem List   Diagnosis Date Noted  . Chest pain 06/23/2017  . AKI (acute kidney injury) ([place]) 06/23/2017  . Acute respiratory insufficiency   . Surgery, elective   . Radiculopathy 01/27/2017  . Cervical radiculopathy 01/27/2017  . Peripheral arterial disease ([place]) 01/23/2016  . Fever 02/22/2015  . Epididymitis, left 02/22/2015  . Cellulitis 02/22/2015  . Orchitis 02/22/2015  . Testicular abscess 02/22/2015  . DM type 2 (diabetes mellitus, type 2) ([place]) 02/22/2015  . Microcytic anemia 02/22/2015  . Epididymitis 02/22/2015  . UTI (lower urinary tract infection) 02/22/2015  . Hypotension 03/22/2014  . Neuroma digital nerve 09/28/2013  . 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 Dehydration 07/03/2010  . NAUSEA AND VOMITING 07/03/2010  . BENIGN PROSTATIC HYPERTROPHY, WITH URINARY OBSTRUCTION 06/13/2010  . ARM PAIN 06/03/2010  . DYSPNEA ON EXERTION 05/15/2010  . TOBACCO ABUSE 04/03/2010  . COPD 04/03/2010  . G E R D 04/03/2010  . OTHER DYSPHAGIA 04/03/2010  . PNEUMONIA, RIGHT LOWER LOBE 02/19/2010  . DIABETES MELLITUS, TYPE II, CONTROLLED 01/14/2010  . LUMBAR RADICULOPATHY, LEFT 05/16/2009  .  DYSPHAGIA UNSPECIFIED 05/16/2009  . COUGH DUE TO ACE INHIBITORS 04/02/2009  . DIZZINESS 07/27/2008  . HYPERCHOLESTEROLEMIA, MIXED 05/11/2008  . CATARACTS, BILATERAL 03/01/2008  . MILD SPINAL STENOSIS, CERVICAL 01/12/2008  . LEG CRAMPS 01/12/2008  . EARLY SATIETY 11/04/2007  . DIVERTICULOSIS, COLON 08/31/2007  . SCABIES 08/26/2007  . ERECTILE DYSFUNCTION 08/18/2007  . ABUSE, ALCOHOL SUSPECTED 08/18/2007  . Essential hypertension 08/17/2007  . HEMOCCULT POSITIVE STOOL 07/05/2007  . ANEMIA, IRON DEFICIENCY NOS 05/31/2007  . WEIGHT LOSS, ABNORMAL 05/10/2006      Past Surgical History:  Procedure Laterality Date  . ANTERIOR CERVICAL DECOMP/DISCECTOMY FUSION  10-16-2009   C4 -- C6  . ANTERIOR CERVICAL DECOMP/DISCECTOMY FUSION N/A 01/27/2017   Procedure: ANTERIOR CERVICAL DECOMPRESSION FUSION CERVICAL 3-4 WITH INSTRUMENTATION AND ALLOGRAFT;  Surgeon: Phylliss Bob, MD;  Location: [place];  Service: Orthopedics;  Laterality: N/A;  ANTERIOR CERVICAL DECOMPRESSION FUSION CERVICAL 3-4 WITH INSTRUMENTATION AND ALLOGRAFT  . CAPSULOTOMY Right 01/26/2013   Procedure: MINOR CAPSULOTOMY;  Surgeon: Myrtha Mantis., MD;  Location: [place];  Service: Ophthalmology;  Laterality: Right;  . CARPECTOMY Right 03/13/2014   Procedure: RIGHT  PROXIMAL ROW CARPECTOMY;  Surgeon: Tennis Must, MD;  Location: [place];  Service: Orthopedics;  Laterality: Right;  . CARPECTOMY Left 10/22/2015   Procedure: LEFT WRIST PROXIMAL ROW CARPECTOMY ;  Surgeon: Leanora Cover, MD;  Location: [place];  Service: Orthopedics;  Laterality: Left;  . CARPOMETACARPAL ([place]) FUSION OF THUMB Right 03/13/2014   Procedure: RIGHT FUSION OF THUMB CARPOMETACARPAL [facility]) JOINT;  Surgeon: Tennis Must, MD;  Location: [place];  Service: Orthopedics;  Laterality: Right;  . CATARACT EXTRACTION W/ INTRAOCULAR LENS  IMPLANT, BILATERAL  2009  . COLONOSCOPY  08-31-2007  . COLONOSCOPY    . CRYOABLATION N/A 01/14/2015   Procedure: CRYO ABLATION PROSTATE;  Surgeon: Ailene Rud, MD;  Location[facility];  Service: Urology;  Laterality: N/A;  . ESOPHAGOGASTRODUODENOSCOPY  last one 09-11-2008  . EXCISION MASS LEFT FACE , SUBORBITAL AREA, PLASTER RECONSTRUCTION  02-09-2011  . EXCISIONAL DEBRIDEMENT AND REPAIR RIGHT QUADRICEP TENDON  07-05-2000  . FOOT SURGERY Left   . I&D EXTREMITY Right 05/24/2013   Procedure: RIGHT INDEX WOUND DEBRIDEMENT AND CLOSURE;  Surgeon: Jolyn Nap, MD;  Location: [place];  Service: Orthopedics;   Laterality: Right;  . INSERTION OF SUPRAPUBIC CATHETER N/A 01/14/2015   Procedure: SUPRAPUBIC TUBE PLACEMENT;  Surgeon: Ailene Rud, MD;  Location: [facility];  Service: Urology;  Laterality: N/A;  . KNEE ARTHROSCOPY Right 2008  . LARYNGOSCOPY AND ESOPHAGOSCOPY  06-13-2010   MARSUPIALIZATION LEFT LARGE VALLECULAR CYST  . MASS EXCISION Left 10/22/2015   Procedure: EXCISION MASS OF LEFT INDEX FINGER;  Surgeon: Leanora Cover, MD;  Location: [place];  Service: Orthopedics;  Laterality: Left;  . ORCHIECTOMY Left 02/24/2015   Procedure: SCROTAL EXPLORATION WITH LEFT ORCHIECTOMY;  Surgeon: Kathie Rhodes, MD;  Location: [place];  Service: Urology;  Laterality: Left;  . POSTERIOR CERVICAL FUSION/FORAMINOTOMY N/A 01/28/2017   Procedure: POSTERIOR SPINAL FUSION CERVICAL 3-4, CERVICAL 4-5, CERVICAL 5-6, CERVICAL 6-7 WITH INSTRUMENATION AND ALLOGRAFT;  Surgeon: Phylliss Bob, MD;  Location: [place];  Service: Orthopedics;  Laterality: N/A;  POSTERIOR SPINAL FUSION CERVICAL 3-4, CERVICAL 4-5, CERVICAL 5-6, CERVICAL 6-7 WITH INSTRUMENATION AND ALLOGRAFT  . SHOULDER ARTHROSCOPY/ DEBRIDEMENT LABRAL TEAR/  BICEPS TENOTOMY Left 09-24-2011  . TONSILLECTOMY  as child  . TRANSTHORACIC ECHOCARDIOGRAM  02-21-2010  normal LVF/  ef 55-60%/ mild to moderate MR/  moderate LAE/  mild TR  . YAG LASER APPLICATION Right 03/26/5034   Procedure: YAG LASER APPLICATION;  Surgeon: Myrtha Mantis., MD;  Location: [place];  Service: Ophthalmology;  Laterality: Right;  . YAG LASER CAPSULOTOMY, LEFT EYE  01-08-2011       Home Medications    Prior to Admission medications   Medication Sig Start Date End Date Taking? Authorizing Provider  AZOPT 1 % ophthalmic suspension Place 1 drop into the left eye 3 (three) times daily.  12/25/16   [provider]  bimatoprost (LUMIGAN) 0.01 % SOLN Place 1 drop into both eyes at bedtime. 04/10/13   Dhungel, Nishant, MD  brimonidine (ALPHAGAN) 0.2 %  ophthalmic solution Place 1 drop into both eyes 2 (two) times daily. 06/25/17   Aline August, MD  brimonidine-timolol (COMBIGAN) 0.2-0.5 % ophthalmic solution Place 1 drop into both eyes 2 (two) times daily.     [provider]  cephALEXin (KEFLEX) 500 MG capsule Take 1 capsule (500 mg total) by mouth 3 (three) times daily. 06/25/17 07/05/17  Aline August, MD  esomeprazole (NEXIUM) 40 MG capsule Take 40 mg by mouth daily. 06/21/17   [provider]  gabapentin (NEURONTIN) 300 MG capsule Take 300 mg by mouth 2 (two) times daily.  10/19/16   [provider]  HYDROcodone-acetaminophen (NORCO) 7.5-325 MG tablet Take 1 tablet by mouth every 8 (eight) hours as needed for moderate pain. pain 12/02/15   [provider]  levofloxacin (LEVAQUIN) 500 MG tablet Take 1 tablet (500 mg total) by mouth daily. 06/28/17 07/07/17  Frederica Kuster, PA-C  meclizine (ANTIVERT) 25 MG tablet Take 25 mg by mouth daily. 06/16/17   [provider]  Multiple Vitamins-Minerals (CENTRUM SILVER ADULT 50+ PO) Take 1 tablet by mouth daily.    [provider]  naproxen (NAPROSYN) 250 MG tablet Take 1 tablet (250 mg total) by mouth 2 (two) times daily. 06/28/17   Adal Sereno, Bea Graff, PA-C  oxybutynin (DITROPAN) 5 MG tablet TAKE 1 TABLET EVERY 12 HOURS    [provider]  pravastatin (PRAVACHOL) 80 MG tablet Take 80 mg by mouth daily.  10/19/16   [provider]  tamsulosin (FLOMAX) 0.4 MG CAPS capsule Take 0.4 mg by mouth at bedtime.  12/03/16   [provider]  varenicline (CHANTIX STARTING MONTH PAK) 0.5 MG X 11 & 1 MG X 42 tablet Take 1 tablet by mouth 2 (two) times daily. 06/16/17   [provider]    Family History Family History  Problem Relation Age of Onset  . Unexplained death Mother        died at a young age, no known cause  . Heart attack Father        age unknown  . Diabetes Unknown   . Cancer Unknown     Social History Social  History  Substance Use Topics  . Smoking status: Current Every Day Smoker    Packs/day: 1.00    Years: 59.00    Types: Cigarettes  . Smokeless tobacco: Never Used     Comment: Started Chantix 10/26/16  . Alcohol use No     Comment: none since approx 2014- heavy before     Allergies   No known allergies   Review of Systems Review of Systems  Constitutional: Negative for chills and fever.  HENT: Negative for facial swelling and sore throat.   Respiratory: Negative for shortness of  breath.   Cardiovascular: Negative for chest pain.  Gastrointestinal: Negative for abdominal pain, nausea and vomiting.  Genitourinary: Positive for scrotal swelling and testicular pain. Negative for dysuria.  Musculoskeletal: Negative for back pain.  Skin: Negative for rash and wound.  Neurological: Negative for headaches.  Psychiatric/Behavioral: The patient is not nervous/anxious.      Physical Exam Updated Vital Signs BP (!) 147/65   Pulse (!) 54   Temp 98.3 F (36.8 C) (Oral)   Resp 12   Ht 6\' 2"  (1.88 m)   Wt 73.5 kg (162 lb)   SpO2 99%   BMI 20.80 kg/m   Physical Exam  Constitutional: He appears well-developed and well-nourished. No distress.  HENT:  Head: Normocephalic and atraumatic.  Mouth/Throat: Oropharynx is clear and moist. No oropharyngeal exudate.  Eyes: Conjunctivae are normal. Pupils are equal, round, and reactive to light. Right eye exhibits no discharge. Left eye exhibits no discharge. No scleral icterus.  Neck: Normal range of motion. Neck supple. No thyromegaly present.  Cardiovascular: Normal rate, regular rhythm, normal heart sounds and intact distal pulses.  Exam reveals no gallop and no friction rub.   No murmur heard. Pulmonary/Chest: Effort normal and breath sounds normal. No stridor. No respiratory distress. He has no wheezes. He has no rales.  Abdominal: Soft. Bowel sounds are normal. He exhibits no distension. There is no tenderness. There is no rebound and  no guarding.  Genitourinary: Penis normal. Right testis shows mass, swelling and tenderness. Left testis shows no mass.  Musculoskeletal: He exhibits no edema.  Lymphadenopathy:    He has no cervical adenopathy.  Neurological: He is alert. Coordination normal.  Skin: Skin is warm and dry. No rash noted. He is not diaphoretic. No pallor.  Psychiatric: He has a normal mood and affect.  Nursing note and vitals reviewed.    ED Treatments / Results  Labs (all labs ordered are listed, but only abnormal results are displayed) Labs Reviewed  BASIC METABOLIC PANEL - Abnormal; Notable for the following:       Result Value   Sodium 133 (*)    Chloride 100 (*)    Glucose, Bld 128 (*)    All other components within normal limits  CBC - Abnormal; Notable for the following:    WBC 11.3 (*)    Hemoglobin 10.5 (*)    HCT 33.5 (*)    MCH 24.7 (*)    RDW 17.9 (*)    All other components within normal limits  URINALYSIS, ROUTINE W REFLEX MICROSCOPIC - Abnormal; Notable for the following:    APPearance HAZY (*)    Leukocytes, UA LARGE (*)    Squamous Epithelial / LPF 0-5 (*)    All other components within normal limits  I-STAT TROPOININ, ED  I-STAT TROPOININ, ED    EKG  EKG Interpretation  Date/Time:  Sunday June 27 2017 19:40:33 EDT Ventricular Rate:  49 PR Interval:    QRS Duration: 96 QT Interval:  472 QTC Calculation: 427 R Axis:   53 Text Interpretation:  Sinus bradycardia no acute ST/T changes no significant change since June 23 2017 Confirmed by Sherwood Gambler [phone]) on 06/27/2017 7:42:50 PM       Radiology Dg Chest 2 View  Result Date: 06/27/2017 CLINICAL DATA:  Left-sided chest pain EXAM: CHEST  2 VIEW COMPARISON:  06/23/2017 FINDINGS: Minimal atelectasis at the right base. Mild bulla at the right lung apex and scarring. No acute consolidation or pleural effusion. Stable cardiomediastinal silhouette with atherosclerosis.  No pneumothorax. IMPRESSION: 1. Stable borderline  cardiomegaly.  No edema or infiltrate 2. Minimal atelectasis at the right base Electronically Signed   By: Donavan Foil M.D.   On: 06/27/2017 21:55   US Scrotum  Result Date: 06/28/2017 CLINICAL DATA:  76 year old male with history of left-sided orchiectomy presenting with right testicular pain. EXAM: SCROTAL ULTRASOUND DOPPLER ULTRASOUND OF THE TESTICLES TECHNIQUE: Complete ultrasound examination of the testicles, epididymis, and other scrotal structures was performed. Color and spectral Doppler ultrasound were also utilized to evaluate blood flow to the testicles. COMPARISON:  Testicular ultrasound dated 02/22/2015 FINDINGS: Right testicle Measurements: 3.7 x 1.5 x 1.9 cm. There is a 0.4 x 0.3 x 0.5 cm new heterogeneous hypoechoic area with probable small cystic spaces in the right testicle. Small echogenic foci within these lesion may be microcalcification or through transmission. This likely corresponds to the focal heterogeneous area present on the prior study of 02/22/2015. This may represent a post inflammatory/infectious process, however metastatic disease or other malignancies such as lymphoma are not excluded. Correlation with clinical exam and further evaluation with MRI recommended. Left testicle Orchectomy. Right epididymis: The right epididymis is mildly enlarged and heterogeneous. The right epididymal head measures 1.5 x 1.5 x 2.1 cm. There is increased flow to the right epididymis. Left epididymis:  Surgically absent Hydrocele:  None visualized. Varicocele:  None visualized. Pulsed Doppler interrogation of both testes demonstrates normal low resistance arterial and venous waveforms to the right testicle. IMPRESSION: 1. Findings concerning for right epididymitis. Correlation with clinical exam and urinalysis recommended. No drainable fluid collection or abscess. 2. Focal heterogeneous and hypoechoic area in the right testicle, likely present on the prior ultrasound of 02/22/2015. Differentials  include postinflammatory/infectious changes/scarring, sequela of prior granulomatous disease, or a neoplastic process such as lymphoma versus metastasis. Correlation with clinical exam and further evaluation with MRI recommended. 3. Left-sided orchectomy. Electronically Signed   By: Anner Crete M.D.   On: 06/28/2017 00:02   [place] Art/ven Flow Abd Pelv Doppler  Result Date: 06/28/2017 CLINICAL DATA:  76 year old male with history of left-sided orchiectomy presenting with right testicular pain. EXAM: SCROTAL ULTRASOUND DOPPLER ULTRASOUND OF THE TESTICLES TECHNIQUE: Complete ultrasound examination of the testicles, epididymis, and other scrotal structures was performed. Color and spectral Doppler ultrasound were also utilized to evaluate blood flow to the testicles. COMPARISON:  Testicular ultrasound dated 02/22/2015 FINDINGS: Right testicle Measurements: 3.7 x 1.5 x 1.9 cm. There is a 0.4 x 0.3 x 0.5 cm new heterogeneous hypoechoic area with probable small cystic spaces in the right testicle. Small echogenic foci within these lesion may be microcalcification or through transmission. This likely corresponds to the focal heterogeneous area present on the prior study of 02/22/2015. This may represent a post inflammatory/infectious process, however metastatic disease or other malignancies such as lymphoma are not excluded. Correlation with clinical exam and further evaluation with MRI recommended. Left testicle Orchectomy. Right epididymis: The right epididymis is mildly enlarged and heterogeneous. The right epididymal head measures 1.5 x 1.5 x 2.1 cm. There is increased flow to the right epididymis. Left epididymis:  Surgically absent Hydrocele:  None visualized. Varicocele:  None visualized. Pulsed Doppler interrogation of both testes demonstrates normal low resistance arterial and venous waveforms to the right testicle. IMPRESSION: 1. Findings concerning for right epididymitis. Correlation with clinical exam and  urinalysis recommended. No drainable fluid collection or abscess. 2. Focal heterogeneous and hypoechoic area in the right testicle, likely present on the prior ultrasound of 02/22/2015. Differentials include postinflammatory/infectious changes/scarring, sequela  of prior granulomatous disease, or a neoplastic process such as lymphoma versus metastasis. Correlation with clinical exam and further evaluation with MRI recommended. 3. Left-sided orchectomy. Electronically Signed   By: Anner Crete M.D.   On: 06/28/2017 00:02    Procedures Procedures (including critical care time)  Medications Ordered in ED Medications  morphine 4 MG/ML injection 4 mg (not administered)  levofloxacin (LEVAQUIN) tablet 500 mg (not administered)  fluconazole (DIFLUCAN) tablet 150 mg (not administered)  morphine 4 MG/ML injection 4 mg (4 mg Intravenous Given 06/27/17 2150)     Initial Impression / Assessment and Plan / ED Course  I have reviewed the triage vital signs and the nursing notes.  Pertinent labs & imaging results that were available during my care of the patient were reviewed by me and considered in my medical decision making (see chart for details).     Patient with epididymitis found on scrotal ultrasound. CBC shows WBC 11.3, stable chronic anemia, hemoglobin 10.5. BMP shows sodium 133, chloride 100, glucose 128. Delta troponin is negative. Patient has been asymptomatic regarding chest pain since arrival. CXR shows stable borderline cardiomegaly, no edema or infiltrate, minimal atelectasis at the right base. EKG shows sinus bradycardia, but no significant change since last tracing. UA shows large leukocytes and budding yeast present. Patient denies any concern for sexual transmitted disease. We'll treat with Levaquin for 10 days and one dose of Diflucan in the ED. Patient to follow-up with PCP and/or urology for recheck this week and further evaluation of right testicular heterogeneous and hypoechoic area  found on scrotal ultrasound. Return precautions discussed. Patient understands and agrees with plan. She vitals stable throughout ED course and discharged in satisfactory condition. Patient also evaluated by Dr. Regenia Skeeter who guided the patient's management and agrees with plan.  Final Clinical Impressions(s) / ED Diagnoses   Final diagnoses:  Acute cystitis without hematuria  Epididymitis    New Prescriptions New Prescriptions   LEVOFLOXACIN (LEVAQUIN) 500 MG TABLET    Take 1 tablet (500 mg total) by mouth daily.   NAPROXEN (NAPROSYN) 250 MG TABLET    Take 1 tablet (250 mg total) by mouth 2 (two) times daily.     Frederica Kuster, PA-C 06/28/17 [id number]    Sherwood Gambler, MD 07/01/17 1000

## 2017-06-28 DIAGNOSIS — N3000 Acute cystitis without hematuria: Secondary | ICD-10-CM | POA: Diagnosis not present

## 2017-06-28 LAB — URINALYSIS, ROUTINE W REFLEX MICROSCOPIC
Bacteria, UA: NONE SEEN
Bilirubin Urine: NEGATIVE
Glucose, UA: NEGATIVE mg/dL
Hgb urine dipstick: NEGATIVE
Ketones, ur: NEGATIVE mg/dL
Nitrite: NEGATIVE
Protein, ur: NEGATIVE mg/dL
Specific Gravity, Urine: 1.016 (ref 1.005–1.030)
pH: 7 (ref 5.0–8.0)

## 2017-06-28 LAB — I-STAT TROPONIN, ED: Troponin i, poc: 0 ng/mL (ref 0.00–0.08)

## 2017-06-28 MED ORDER — MORPHINE SULFATE (PF) 4 MG/ML IV SOLN
4.0000 mg | Freq: Once | INTRAVENOUS | Status: AC
  Administered 2017-06-28: 4 mg via INTRAVENOUS
  Filled 2017-06-28: qty 1

## 2017-06-28 MED ORDER — LEVOFLOXACIN 500 MG PO TABS: 500.0000 mg | ORAL_TABLET | Freq: Every day | ORAL | 0 refills | Status: AC

## 2017-06-28 MED ORDER — FLUCONAZOLE 100 MG PO TABS
150.0000 mg | ORAL_TABLET | Freq: Once | ORAL | Status: AC
  Administered 2017-06-28: 150 mg via ORAL
  Filled 2017-06-28: qty 2

## 2017-06-28 MED ORDER — LEVOFLOXACIN 500 MG PO TABS
500.0000 mg | ORAL_TABLET | Freq: Once | ORAL | Status: AC
  Administered 2017-06-28: 500 mg via ORAL
  Filled 2017-06-28: qty 1

## 2017-06-28 MED ORDER — NAPROXEN 250 MG PO TABS: 250.0000 mg | ORAL_TABLET | Freq: Two times a day (BID) | ORAL | 0 refills | Status: DC

## 2017-06-28 NOTE — Discharge Instructions (Signed)
Medications: Levaquin, naproxen  Treatment: Take Levaquin once daily for 10 days. Take naproxen twice daily for your pain. You can alternate with Tylenol as prescribed over-the-counter. Make sure to finish all of this medication. You may also find that ice and elevation of your scrotum is helpful for your pain.  Follow-up: Please follow-up with Dr. Karsten Ro, your urologist, for further evaluation and treatment of your epididymitis and the cyst found on your right testicle. If you cannot see your urologist this week, please see your primary care doctor for recheck of your urine and symptoms. Please return to the emergency department if you develop any new or worsening symptoms.

## 2017-07-13 ENCOUNTER — Ambulatory Visit (INDEPENDENT_AMBULATORY_CARE_PROVIDER_SITE_OTHER): Payer: Medicare Other | Admitting: Podiatry

## 2017-07-13 ENCOUNTER — Encounter: Payer: Self-pay | Admitting: Podiatry

## 2017-07-13 DIAGNOSIS — G5762 Lesion of plantar nerve, left lower limb: Secondary | ICD-10-CM | POA: Diagnosis not present

## 2017-07-13 NOTE — Progress Notes (Signed)
He presents for follow-up of neuritis/neuroma to the third interdigital space of the left foot. He states that is doing much better actually.  Objective: Vital signs are stable alert and oriented 3. Pulses are palpable. Neurologic was intact. He has neuroma or neuritis to the third interdigital space with tenderness on palpation to this area at the proximal end of his old scar. This appears to be very superficial.  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  Plan: I injected this area today superficially with dehydrated alcohol. Follow-up with him 3 weeks

## 2017-07-16 ENCOUNTER — Encounter: Payer: Self-pay | Admitting: Cardiology

## 2017-07-16 ENCOUNTER — Ambulatory Visit (INDEPENDENT_AMBULATORY_CARE_PROVIDER_SITE_OTHER): Payer: Medicare Other | Admitting: Cardiology

## 2017-07-16 VITALS — BP 140/70 | HR 62 | Ht 74.0 in | Wt 158.0 lb

## 2017-07-16 DIAGNOSIS — R55 Syncope and collapse: Secondary | ICD-10-CM

## 2017-07-16 DIAGNOSIS — F17200 Nicotine dependence, unspecified, uncomplicated: Secondary | ICD-10-CM | POA: Diagnosis not present

## 2017-07-16 DIAGNOSIS — I341 Nonrheumatic mitral (valve) prolapse: Secondary | ICD-10-CM

## 2017-07-16 DIAGNOSIS — R079 Chest pain, unspecified: Secondary | ICD-10-CM | POA: Diagnosis not present

## 2017-07-16 DIAGNOSIS — R001 Bradycardia, unspecified: Secondary | ICD-10-CM | POA: Diagnosis not present

## 2017-07-16 DIAGNOSIS — I10 Essential (primary) hypertension: Secondary | ICD-10-CM

## 2017-07-16 DIAGNOSIS — K219 Gastro-esophageal reflux disease without esophagitis: Secondary | ICD-10-CM | POA: Diagnosis not present

## 2017-07-16 MED ORDER — ESOMEPRAZOLE MAGNESIUM 40 MG PO CPDR: 40.0000 mg | DELAYED_RELEASE_CAPSULE | Freq: Every day | ORAL | 2 refills | Status: DC

## 2017-07-16 NOTE — Patient Instructions (Signed)
Medication Instructions:  Your physician has recommended you make the following change in your medication:  1. Take nexium (40 mg ) daily, sent in to patient's requested pharmacy   Labwork: -None-  Testing/Procedures: -None  Follow-Up: Your physician recommends that you keep your scheduled follow-up appointment with Dr. Tamala Julian   Any Other Special Instructions Will Be Listed Below (If Applicable).   Go to local drug store and get at least medium support hose.   If you need a refill on your cardiac medications before your next appointment, please call your pharmacy.

## 2017-07-16 NOTE — Progress Notes (Signed)
Cardiology Office Note   Date:  07/16/2017   ID:  Russell [name], DOB [birth year], MRN [id number]  PCP:  Rogers Blocker, MD  Cardiologist:  Dr. Tamala Julian PV Dr. Gwenlyn Found     Chief Complaint  Patient presents with  . Chest Pain    near syncope      History of Present Illness: Russell [name] is a 76 y.o. male who presents for post hospital for chest pain and acute kidney injury.  troponins have been negative.  He has a hx of tob use, HTN, HLD, COPD, GERD, depression, glaucoma, ?DM2, bi-leaflet MV prolapse w/ mild-mod MR on echo 2011, EF normal. PAD w/ L-ABI 0.51 01/2016 s/p evaluation by Dr Gwenlyn Found w/ no surgery on L foot due to high risk of lack of healing, prn f/u recommended.   Hx bradycardia, seen in ER 03/2014 for syncope w/ sinus brady, HR 54, felt vasovagal,no further eval. ECG 09/2015 w/ sinus brady, HR 40. ER visit for syncope 03/2016 w/ pt becoming light-headed when he got out of bed, sx worsened, diaphoretic>>lost consciousness for 5" per ER notes. 06/2016 ER visit for near-syncope but pt had been sitting outside for a long time on a hot day.   2-D echo 12/02/16 showed normal LVEF 50% with inferolateral and anterolateral walls mildly hypokinetic with grade 1 DD, trivial MR. 2 week monitor showed sinus bradycardia at 48 bpm to sinus tachycardia at 116 bpm with PVCs no significant pauses.He did have 5 PVCs and one minute with lead loss.  Recent hospitalization with AKI and altered mental status.  Also with ant chest pain for several minutes.  Cr was up to 2, IV fluids given and and losartan held.  He is on flomax for BPH.  HR on admit was 45 SB and no acute changes. HR improved once kidney function improved.   Today he tells me when he stands up he almost passes out.  He had orthostatic BP check yesterday with decreased on standing from 578 systolic with lying to 469.  HR was low lying at 43 but with standing to 67.  His chest pain is lt ant and worse after meals.  He has stopped  taking nexium.  BP today is stable.   nuc study in 11/2016 was neg for ischemia.   Echo at that time with EF 60-65%.  He would like to stop smoking.  EKG today is SR at 62.     Past Medical History:  Diagnosis Date  . AKI (acute kidney injury) ([place]) 06/23/2017  . Arthritis    SHOUDLERS, LUMBAR  . BPH with obstruction/lower urinary tract symptoms   . BPH with urinary obstruction 01/15/2016  . COPD (chronic obstructive pulmonary disease) ([place])   . Coronary atherosclerosis    denied knowledge of this 10/27/16  . Depression   . Diverticulosis of colon   . Dyspnea   . ED (erectile dysfunction)   . Full dentures   . GERD (gastroesophageal reflux disease)    not current  . Glaucoma   . History of gastritis   . History of scabies    2008  . 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 History of syncope    03-22-2014  DX  VAGAL RESPONSE  . Hyperlipidemia, mixed   . Hypertension   . Mitral regurgitation   . Peripheral arterial disease ([place])    one vessel runoff bilaterally via peroneal arteries  . Prostate cancer (  [place])   . Seizures ([place]) 03/2014   ? or syncope- patient refused to be admitted for further studies- "felt better"  . Type 2 diabetes, diet controlled ([place])    patient and his wife said no- have not been told 01/21/17  . Wears glasses     Past Surgical History:  Procedure Laterality Date  . ANTERIOR CERVICAL DECOMP/DISCECTOMY FUSION  10-16-2009   C4 -- C6  . ANTERIOR CERVICAL DECOMP/DISCECTOMY FUSION N/A 01/27/2017   Procedure: ANTERIOR CERVICAL DECOMPRESSION FUSION CERVICAL 3-4 WITH INSTRUMENTATION AND ALLOGRAFT;  Surgeon: Phylliss Bob, MD;  Location: [place];  Service: Orthopedics;  Laterality: N/A;  ANTERIOR CERVICAL DECOMPRESSION FUSION CERVICAL 3-4 WITH INSTRUMENTATION AND ALLOGRAFT  . CAPSULOTOMY Right 01/26/2013   Procedure: MINOR CAPSULOTOMY;  Surgeon: Myrtha Mantis., MD;  Location: [place];  Service: Ophthalmology;  Laterality: Right;  .  CARPECTOMY Right 03/13/2014   Procedure: RIGHT  PROXIMAL ROW CARPECTOMY;  Surgeon: Tennis Must, MD;  Location: [place];  Service: Orthopedics;  Laterality: Right;  . CARPECTOMY Left 10/22/2015   Procedure: LEFT WRIST PROXIMAL ROW CARPECTOMY ;  Surgeon: Leanora Cover, MD;  Location: [place];  Service: Orthopedics;  Laterality: Left;  . CARPOMETACARPAL ([place]) FUSION OF THUMB Right 03/13/2014   Procedure: RIGHT FUSION OF THUMB CARPOMETACARPAL [facility]) JOINT;  Surgeon: Tennis Must, MD;  Location: [place];  Service: Orthopedics;  Laterality: Right;  . CATARACT EXTRACTION W/ INTRAOCULAR LENS  IMPLANT, BILATERAL  2009  . COLONOSCOPY  08-31-2007  . COLONOSCOPY    . CRYOABLATION N/A 01/14/2015   Procedure: CRYO ABLATION PROSTATE;  Surgeon: Ailene Rud, MD;  Location: [facility];  Service: Urology;  Laterality: N/A;  . ESOPHAGOGASTRODUODENOSCOPY  last one 09-11-2008  . EXCISION MASS LEFT FACE , SUBORBITAL AREA, PLASTER RECONSTRUCTION  02-09-2011  . EXCISIONAL DEBRIDEMENT AND REPAIR RIGHT QUADRICEP TENDON  07-05-2000  . FOOT SURGERY Left   . I&D EXTREMITY Right 05/24/2013   Procedure: RIGHT INDEX WOUND DEBRIDEMENT AND CLOSURE;  Surgeon: Jolyn Nap, MD;  Location: [place];  Service: Orthopedics;  Laterality: Right;  . INSERTION OF SUPRAPUBIC CATHETER N/A 01/14/2015   Procedure: SUPRAPUBIC TUBE PLACEMENT;  Surgeon: Ailene Rud, MD;  Location: [facility];  Service: Urology;  Laterality: N/A;  . KNEE ARTHROSCOPY Right 2008  . LARYNGOSCOPY AND ESOPHAGOSCOPY  06-13-2010   MARSUPIALIZATION LEFT LARGE VALLECULAR CYST  . MASS EXCISION Left 10/22/2015   Procedure: EXCISION MASS OF LEFT INDEX FINGER;  Surgeon: Leanora Cover, MD;  Location: [place];  Service: Orthopedics;  Laterality: Left;  . ORCHIECTOMY Left 02/24/2015   Procedure: SCROTAL EXPLORATION WITH LEFT ORCHIECTOMY;   Surgeon: Kathie Rhodes, MD;  Location: [place];  Service: Urology;  Laterality: Left;  . POSTERIOR CERVICAL FUSION/FORAMINOTOMY N/A 01/28/2017   Procedure: POSTERIOR SPINAL FUSION CERVICAL 3-4, CERVICAL 4-5, CERVICAL 5-6, CERVICAL 6-7 WITH INSTRUMENATION AND ALLOGRAFT;  Surgeon: Phylliss Bob, MD;  Location: [place];  Service: Orthopedics;  Laterality: N/A;  POSTERIOR SPINAL FUSION CERVICAL 3-4, CERVICAL 4-5, CERVICAL 5-6, CERVICAL 6-7 WITH INSTRUMENATION AND ALLOGRAFT  . SHOULDER ARTHROSCOPY/ DEBRIDEMENT LABRAL TEAR/  BICEPS TENOTOMY Left 09-24-2011  . TONSILLECTOMY  as child  . TRANSTHORACIC ECHOCARDIOGRAM  02-21-2010   normal LVF/  ef 55-60%/ mild to moderate MR/  moderate LAE/  mild TR  . YAG LASER APPLICATION Right 12/29/3788   Procedure: YAG LASER APPLICATION;  Surgeon: Myrtha Mantis., MD;  Location: [facility]  OR;  Service: Ophthalmology;  Laterality: Right;  . YAG LASER CAPSULOTOMY, LEFT EYE  01-08-2011     Current Outpatient Prescriptions  Medication Sig Dispense Refill  . AZOPT 1 % ophthalmic suspension Place 1 drop into the left eye 3 (three) times daily.     . bimatoprost (LUMIGAN) 0.01 % SOLN Place 1 drop into both eyes at bedtime. 30 mL 3  . brimonidine (ALPHAGAN) 0.2 % ophthalmic solution Place 1 drop into both eyes 2 (two) times daily. 5 mL 12  . brimonidine-timolol (COMBIGAN) 0.2-0.5 % ophthalmic solution Place 1 drop into both eyes 2 (two) times daily.     Marland Kitchen esomeprazole (NEXIUM) 40 MG capsule Take 1 capsule (40 mg total) by mouth daily. 30 capsule 2  . gabapentin (NEURONTIN) 300 MG capsule Take 300 mg by mouth 2 (two) times daily.   3  . HYDROcodone-acetaminophen (NORCO) 7.5-325 MG tablet Take 1 tablet by mouth every 8 (eight) hours as needed for moderate pain. pain    . losartan (COZAAR) 100 MG tablet Take 100 mg by mouth daily.  4  . meclizine (ANTIVERT) 25 MG tablet Take 25 mg by mouth daily.    . Multiple Vitamins-Minerals (CENTRUM SILVER ADULT 50+ PO) Take 1 tablet by mouth  daily.    Marland Kitchen oxybutynin (DITROPAN) 5 MG tablet TAKE 1 TABLET EVERY 12 HOURS  11  . pravastatin (PRAVACHOL) 80 MG tablet Take 80 mg by mouth daily.   2  . tamsulosin (FLOMAX) 0.4 MG CAPS capsule Take 0.4 mg by mouth at bedtime.     . varenicline (CHANTIX STARTING MONTH PAK) 0.5 MG X 11 & 1 MG X 42 tablet Take 1 tablet by mouth 2 (two) times daily.     No current facility-administered medications for this visit.     Allergies:   No known allergies    Social History:  The patient  reports that he has been smoking Cigarettes.  He has a 59.00 pack-year smoking history. He has never used smokeless tobacco. He reports that he does not drink alcohol or use drugs.   Family History:  The patient's family history includes Cancer in his unknown relative; Diabetes in his unknown relative; Heart attack in his father; Unexplained death in his mother.    ROS:  General:no colds or fevers, some weight loss Skin:no rashes or ulcers HEENT:no blurred vision, no congestion CV:see HPI PUL:see HPI GI:no diarrhea constipation or melena, no indigestion GU:no hematuria, no dysuria MS:no joint pain, no claudication Neuro:no syncope,+ lightheadedness when standing Endo:no diabetes, no thyroid disease  Wt Readings from Last 3 Encounters:  07/16/17 158 lb (71.7 kg)  06/27/17 162 lb (73.5 kg)  06/25/17 162 lb (73.5 kg)     PHYSICAL EXAM: VS:  BP 140/70 (BP Location: Right Arm, Patient Position: Sitting, Cuff Size: Normal)   Pulse 62   Ht 6\' 2"  (1.88 m)   Wt 158 lb (71.7 kg)   SpO2 98%   BMI 20.29 kg/m  , BMI Body mass index is 20.29 kg/m. General:Pleasant affect, NAD Skin:Warm and dry, brisk capillary refill HEENT:normocephalic, sclera clear, mucus membranes moist Neck:supple, no JVD, no bruits  Heart:S1S2 RRR HBZJ6/9 systolic murmur, no gallup, rub or click Lungs:clear without rales, rhonchi, or wheezes CVE:LFYB, non tender, + BS, do not palpate liver spleen or masses Ext:no lower ext edema, 2+  radial pulses Neuro:alert and oriented X 3, MAE, follows commands, + facial symmetry    EKG:  EKG is ordered today. The ekg ordered today demonstrates  SR at 62 no acute EKG changes from previous.  Nonspecific T wave abnormalities.    Recent Labs: 06/24/2017: ALT 16 06/25/2017: Magnesium 2.4 06/27/2017: BUN 15; Creatinine, Ser 1.08; Hemoglobin 10.5; Platelets 312; Potassium 4.3; Sodium 133    Lipid Panel    Component Value Date/Time   CHOL 143 06/24/2017 0211   TRIG 264 (H) 06/24/2017 0211   HDL 35 (L) 06/24/2017 0211   CHOLHDL 4.1 06/24/2017 0211   VLDL 53 (H) 06/24/2017 0211   LDLCALC 55 06/24/2017 0211       Other studies Reviewed: Additional studies/ records that were reviewed today include: . 12/17/16 nuc study Study Highlights     Nuclear stress EF: 43%.  There was no ST segment deviation noted during stress.  This is an intermediate risk study.  The left ventricular ejection fraction is moderately decreased (30-44%).   1. Fixed small, mild basal inferior perfusion defect.  This may be due to diaphragmatic attenuation.  No ischemia.  2. EF 43% with diffuse hypokinesis.  3. Intermediate risk study due to decreased LV systolic function.     06/24/17 Echo  Study Conclusions  - Left ventricle: The cavity size was normal. Wall thickness was   increased in a pattern of mild LVH. Systolic function was normal.   The estimated ejection fraction was in the range of 60% to 65%.   Features are consistent with a pseudonormal left ventricular   filling pattern, with concomitant abnormal relaxation and   increased filling pressure (grade 2 diastolic dysfunction). - Mitral valve: Valve area by pressure half-time: 2.27 cm^2.  ASSESSMENT AND PLAN:  1.  Chest pain with neg troponins in hospital, EKG is stable.  Neg nuc in Dec for ischemia.  He has stopped his PPI  I am adding back his nexium 40 mg daily to see if improves.  If pain continues he will call - at that point  may need cath.  Keep follow up with Dr. Tamala Julian  2. Sinus brady with AKI now improved.  Though may have episodes.  With orthostatics  His HR goes up to 60s with standing  3.  Orthostatic hypotension,  Wear support stockings, drink fluids.  Near syncope  4.  tobacco use, continues to smoke  5.  GERD resume nexium  6. MVP        Current medicines are reviewed with the patient today.  The patient Has no concerns regarding medicines.  The following changes have been made:  See above Labs/ tests ordered today include:see above  Disposition:   FU:  see above  Signed, Cecilie Kicks, NP  07/16/2017 3:33 PM    [facility] [place], [place], [address] [place], [place] Phone: [phone]; Fax: [phone]

## 2017-08-03 ENCOUNTER — Ambulatory Visit (INDEPENDENT_AMBULATORY_CARE_PROVIDER_SITE_OTHER): Payer: Medicare Other | Admitting: Podiatry

## 2017-08-03 ENCOUNTER — Encounter: Payer: Self-pay | Admitting: Podiatry

## 2017-08-03 DIAGNOSIS — G5762 Lesion of plantar nerve, left lower limb: Secondary | ICD-10-CM

## 2017-08-03 NOTE — Progress Notes (Signed)
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 States he is doing K today but it has been hurting.  Objective: Vital signs are stable he is alert and oriented 3. Neuroma third interspace left foot.  Assessment: Neuroma third interspace left foot.  Plan: We injected dehydrated alcohol to the proximal end of his previous scar. He states that we were right on the money today. Follow up within 3 weeks to reassess.

## 2017-08-19 NOTE — Progress Notes (Signed)
Cardiology Office Note    Date:  08/20/2017   ID:  Russell [name] April 15, 1941, MRN [id number]  PCP:  Rogers Blocker, MD  Cardiologist: Sinclair Grooms, MD   Chief Complaint  Patient presents with  . Shortness of Breath    History of Present Illness:  Russell [name] is a 76 y.o. male with a hx of tob use, HTN, HLD, COPD, GERD, depression, glaucoma, ?DM2, bi-leaflet MV prolapse w/ mild-mod MR on echo 2011, EF normal. PAD w/ L-ABI 0.51 01/2016 s/p evaluation by Dr Gwenlyn Found w/ no surgery on L foot due to high risk of lack of healing, and bradycardia,  Major complaint is exertional dyspnea. Dyspnea resolves with rest. He denies associated chest discomfort/tightness/fullness. He denies lower extremity swelling. No prior history of congestive heart failure. When he lies down the breathing improves. He denies PND.  Past Medical History:  Diagnosis Date  . AKI (acute kidney injury) ([place]) 06/23/2017  . Arthritis    SHOUDLERS, LUMBAR  . BPH with obstruction/lower urinary tract symptoms   . BPH with urinary obstruction 01/15/2016  . COPD (chronic obstructive pulmonary disease) ([place])   . Coronary atherosclerosis    denied knowledge of this 10/27/16  . Depression   . Diverticulosis of colon   . Dyspnea   . ED (erectile dysfunction)   . Full dentures   . GERD (gastroesophageal reflux disease)    not current  . Glaucoma   . History of gastritis   . History of scabies    2008  . 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 History of syncope    03-22-2014  DX  VAGAL RESPONSE  . Hyperlipidemia, mixed   . Hypertension   . Mitral regurgitation   . Peripheral arterial disease ([place])    one vessel runoff bilaterally via peroneal arteries  . Prostate cancer ([place])   . Seizures ([place]) 03/2014   ? or syncope- patient refused to be admitted for further studies- "felt better"  . Type 2 diabetes, diet controlled ([place])    patient and his wife said no- have not been  told 01/21/17  . Wears glasses     Past Surgical History:  Procedure Laterality Date  . ANTERIOR CERVICAL DECOMP/DISCECTOMY FUSION  10-16-2009   C4 -- C6  . ANTERIOR CERVICAL DECOMP/DISCECTOMY FUSION N/A 01/27/2017   Procedure: ANTERIOR CERVICAL DECOMPRESSION FUSION CERVICAL 3-4 WITH INSTRUMENTATION AND ALLOGRAFT;  Surgeon: Phylliss Bob, MD;  Location: [place];  Service: Orthopedics;  Laterality: N/A;  ANTERIOR CERVICAL DECOMPRESSION FUSION CERVICAL 3-4 WITH INSTRUMENTATION AND ALLOGRAFT  . CAPSULOTOMY Right 01/26/2013   Procedure: MINOR CAPSULOTOMY;  Surgeon: Myrtha Mantis., MD;  Location: [place];  Service: Ophthalmology;  Laterality: Right;  . CARPECTOMY Right 03/13/2014   Procedure: RIGHT  PROXIMAL ROW CARPECTOMY;  Surgeon: Tennis Must, MD;  Location: [place];  Service: Orthopedics;  Laterality: Right;  . CARPECTOMY Left 10/22/2015   Procedure: LEFT WRIST PROXIMAL ROW CARPECTOMY ;  Surgeon: Leanora Cover, MD;  Location: [place];  Service: Orthopedics;  Laterality: Left;  . CARPOMETACARPAL ([place]) FUSION OF THUMB Right 03/13/2014   Procedure: RIGHT FUSION OF THUMB CARPOMETACARPAL [facility]) JOINT;  Surgeon: Tennis Must, MD;  Location: [place];  Service: Orthopedics;  Laterality: Right;  . CATARACT EXTRACTION W/ INTRAOCULAR LENS  IMPLANT, BILATERAL  2009  . COLONOSCOPY  08-31-2007  . COLONOSCOPY    .  CRYOABLATION N/A 01/14/2015   Procedure: CRYO ABLATION PROSTATE;  Surgeon: Ailene Rud, MD;  Location[facility];  Service: Urology;  Laterality: N/A;  . ESOPHAGOGASTRODUODENOSCOPY  last one 09-11-2008  . EXCISION MASS LEFT FACE , SUBORBITAL AREA, PLASTER RECONSTRUCTION  02-09-2011  . EXCISIONAL DEBRIDEMENT AND REPAIR RIGHT QUADRICEP TENDON  07-05-2000  . FOOT SURGERY Left   . I&D EXTREMITY Right 05/24/2013   Procedure: RIGHT INDEX WOUND DEBRIDEMENT AND CLOSURE;  Surgeon: Jolyn Nap, MD;  Location: [place];  Service: Orthopedics;  Laterality: Right;  . INSERTION OF SUPRAPUBIC CATHETER N/A 01/14/2015   Procedure: SUPRAPUBIC TUBE PLACEMENT;  Surgeon: Ailene Rud, MD;  Location[facility];  Service: Urology;  Laterality: N/A;  . KNEE ARTHROSCOPY Right 2008  . LARYNGOSCOPY AND ESOPHAGOSCOPY  06-13-2010   MARSUPIALIZATION LEFT LARGE VALLECULAR CYST  . MASS EXCISION Left 10/22/2015   Procedure: EXCISION MASS OF LEFT INDEX FINGER;  Surgeon: Leanora Cover, MD;  Location: [place];  Service: Orthopedics;  Laterality: Left;  . ORCHIECTOMY Left 02/24/2015   Procedure: SCROTAL EXPLORATION WITH LEFT ORCHIECTOMY;  Surgeon: Kathie Rhodes, MD;  Location: [place];  Service: Urology;  Laterality: Left;  . POSTERIOR CERVICAL FUSION/FORAMINOTOMY N/A 01/28/2017   Procedure: POSTERIOR SPINAL FUSION CERVICAL 3-4, CERVICAL 4-5, CERVICAL 5-6, CERVICAL 6-7 WITH INSTRUMENATION AND ALLOGRAFT;  Surgeon: Phylliss Bob, MD;  Location: [place];  Service: Orthopedics;  Laterality: N/A;  POSTERIOR SPINAL FUSION CERVICAL 3-4, CERVICAL 4-5, CERVICAL 5-6, CERVICAL 6-7 WITH INSTRUMENATION AND ALLOGRAFT  . SHOULDER ARTHROSCOPY/ DEBRIDEMENT LABRAL TEAR/  BICEPS TENOTOMY Left 09-24-2011  . TONSILLECTOMY  as child  . TRANSTHORACIC ECHOCARDIOGRAM  02-21-2010   normal LVF/  ef 55-60%/ mild to moderate MR/  moderate LAE/  mild TR  . YAG LASER APPLICATION Right 08/28/3531   Procedure: YAG LASER APPLICATION;  Surgeon: Myrtha Mantis., MD;  Location: [place];  Service: Ophthalmology;  Laterality: Right;  . YAG LASER CAPSULOTOMY, LEFT EYE  01-08-2011    Current Medications: Outpatient Medications Prior to Visit  Medication Sig Dispense Refill  . AZOPT 1 % ophthalmic suspension Place 1 drop into the left eye 3 (three) times daily.     . bimatoprost (LUMIGAN) 0.01 % SOLN Place 1 drop into both eyes at bedtime. 30 mL 3  . brimonidine (ALPHAGAN) 0.2 % ophthalmic solution Place 1 drop into both eyes 2  (two) times daily. 5 mL 12  . brimonidine-timolol (COMBIGAN) 0.2-0.5 % ophthalmic solution Place 1 drop into both eyes 2 (two) times daily.     Marland Kitchen esomeprazole (NEXIUM) 40 MG capsule Take 1 capsule (40 mg total) by mouth daily. 30 capsule 2  . gabapentin (NEURONTIN) 300 MG capsule Take 300 mg by mouth 2 (two) times daily.   3  . HYDROcodone-acetaminophen (NORCO) 7.5-325 MG tablet Take 1 tablet by mouth every 8 (eight) hours as needed for moderate pain. pain    . losartan (COZAAR) 100 MG tablet Take 100 mg by mouth daily.  4  . meclizine (ANTIVERT) 25 MG tablet Take 25 mg by mouth daily.    . Multiple Vitamins-Minerals (CENTRUM SILVER ADULT 50+ PO) Take 1 tablet by mouth daily.    Marland Kitchen oxybutynin (DITROPAN) 5 MG tablet TAKE 1 TABLET EVERY 12 HOURS  11  . pravastatin (PRAVACHOL) 80 MG tablet Take 80 mg by mouth daily.   2  . tamsulosin (FLOMAX) 0.4 MG CAPS capsule Take 0.4 mg by mouth at bedtime.     Marland Kitchen  varenicline (CHANTIX STARTING MONTH PAK) 0.5 MG X 11 & 1 MG X 42 tablet Take 1 tablet by mouth 2 (two) times daily.     No facility-administered medications prior to visit.      Allergies:   No known allergies   Social History   Social History  . Marital status: Married    Spouse name: N/A  . Number of children: N/A  . Years of education: N/A   Occupational History  . Retired - poured concrete    Social History Main Topics  . Smoking status: Current Every Day Smoker    Packs/day: 1.00    Years: 59.00    Types: Cigarettes  . Smokeless tobacco: Never Used     Comment: Started Chantix 10/26/16  . Alcohol use No     Comment: none since approx 2014- heavy before  . Drug use: No  . Sexual activity: Not Asked   Other Topics Concern  . None   Social History Narrative   Lives with wife.     Family History:  The patient's family history includes Cancer in his unknown relative; Diabetes in his unknown relative; Heart attack in his father; Unexplained death in his mother.   ROS:     Please see the history of present illness.    He has mild cough. No phlegm production. Shortness of breath with exertion but no edema or orthopnea.  All other systems reviewed and are negative.   PHYSICAL EXAM:   VS:  BP 112/78 (BP Location: Left Arm)   Pulse 76   Ht 6\' 2"  (1.88 m)   Wt 160 lb 6.4 oz (72.8 kg)   BMI 20.59 kg/m    GEN: Tall, slender, and borderline malnourished appearing., Well developed. In no acute distress  HEENT: normal  Neck: no JVD, carotid bruits, or masses Cardiac: RRR; no murmurs, rubs, or gallops,no edema  Respiratory:  clear to auscultation bilaterally, normal work of breathing GI: soft, nontender, nondistended, + BS MS: no deformity or atrophy  Skin: warm and dry, no rash Neuro:  Alert and Oriented x 3, Strength and sensation are intact Psych: euthymic mood, full affect  Wt Readings from Last 3 Encounters:  08/20/17 160 lb 6.4 oz (72.8 kg)  07/16/17 158 lb (71.7 kg)  06/27/17 162 lb (73.5 kg)      Studies/Labs Reviewed:   EKG:  EKG  Normal sinus rhythm, nonspecific T wave abnormality, no acute changes and no change compared to most recent prior tracing of 06/27/2017. Both EKGs were personally reviewed.  Recent Labs: 06/24/2017: ALT 16 06/25/2017: Magnesium 2.4 06/27/2017: BUN 15; Creatinine, Ser 1.08; Hemoglobin 10.5; Platelets 312; Potassium 4.3; Sodium 133   Lipid Panel    Component Value Date/Time   CHOL 143 06/24/2017 0211   TRIG 264 (H) 06/24/2017 0211   HDL 35 (L) 06/24/2017 0211   CHOLHDL 4.1 06/24/2017 0211   VLDL 53 (H) 06/24/2017 0211   LDLCALC 55 06/24/2017 0211    Additional studies/ records that were reviewed today include:  12/17/16 nuc study Study Highlights     Nuclear stress EF: 43%.  There was no ST segment deviation noted during stress.  This is an intermediate risk study.  The left ventricular ejection fraction is moderately decreased (30-44%).  1. Fixed small, mild basal inferior perfusion defect. This may  be due to diaphragmatic attenuation. No ischemia.  2. EF 43% with diffuse hypokinesis.  3. Intermediate risk study due to decreased LV systolic function.  06/24/17 Echo  Study Conclusions  - Left ventricle: The cavity size was normal. Wall thickness was increased in a pattern of mild LVH. Systolic function was normal. The estimated ejection fraction was in the range of 60% to 65%. Features are consistent with a pseudonormal left ventricular filling pattern, with concomitant abnormal relaxation and increased filling pressure (grade 2 diastolic dysfunction). - Mitral valve: Valve area by pressure half-time: 2.27 cm^2.   Lower extremity vascular ultrasound 12/24/2015: 50-74% right mid SFA stenosis. 30-49% left SFA disease, without focal stenosis. Occluded bilateral anterior and posterior tibial arteries. One vessel run-off, bilaterally.   ASSESSMENT:    1. Essential hypertension   2. Peripheral arterial disease ([place])   3. Type 2 diabetes mellitus with diabetic peripheral angiopathy without gangrene, without long-term current use of insulin ([place])   4. HYPERCHOLESTEROLEMIA, MIXED   5. Stable angina pectoris ([place])   6. Dyspnea on exertion      PLAN:  In order of problems listed above:  1. The blood pressure is well controlled 2. Peripheral arterial disease as noted above. I encouraged patient to discontinue cigarette smoking. 3. Management by Dr. Kevan Ny his primary care physician. 4. Last LDL cholesterol was 55 on 06/24/2017. Continue current therapy. 5. Low risk myocardial perfusion study within the past 12 months. Do not feel it is advisable to proceed with coronary angiography in absence of frank anginal complaints. It is possible however that dyspnea on exertion could be an anginal equivalent. If he does not have significant COPD on pulmonary function testing, perhaps angiography will be necessary. 6. Possibly/probably related to COPD. We'll perform  pulmonary function testing with DLCO. Encouraged the patient to stop smoking.    Medication Adjustments/Labs and Tests Ordered: Current medicines are reviewed at length with the patient today.  Concerns regarding medicines are outlined above.  Medication changes, Labs and Tests ordered today are listed in the Patient Instructions below. There are no Patient Instructions on file for this visit.   Signed, Sinclair Grooms, MD  08/20/2017 12:23 PM    [facility] [address], [address], [place]  [address] Phone: [phone]; Fax: [phone]

## 2017-08-20 ENCOUNTER — Ambulatory Visit (INDEPENDENT_AMBULATORY_CARE_PROVIDER_SITE_OTHER): Payer: Medicare Other | Admitting: Interventional Cardiology

## 2017-08-20 ENCOUNTER — Encounter: Payer: Self-pay | Admitting: Interventional Cardiology

## 2017-08-20 VITALS — BP 112/78 | HR 76 | Ht 74.0 in | Wt 160.4 lb

## 2017-08-20 DIAGNOSIS — I208 Other forms of angina pectoris: Secondary | ICD-10-CM | POA: Diagnosis not present

## 2017-08-20 DIAGNOSIS — E7800 Pure hypercholesterolemia, unspecified: Secondary | ICD-10-CM | POA: Diagnosis not present

## 2017-08-20 DIAGNOSIS — E1151 Type 2 diabetes mellitus with diabetic peripheral angiopathy without gangrene: Secondary | ICD-10-CM | POA: Diagnosis not present

## 2017-08-20 DIAGNOSIS — R0609 Other forms of dyspnea: Secondary | ICD-10-CM

## 2017-08-20 DIAGNOSIS — I739 Peripheral vascular disease, unspecified: Secondary | ICD-10-CM

## 2017-08-20 DIAGNOSIS — I10 Essential (primary) hypertension: Secondary | ICD-10-CM | POA: Diagnosis not present

## 2017-08-20 NOTE — Patient Instructions (Signed)
Medication Instructions:  None  Labwork: None  Testing/Procedures: Your physician has recommended that you have a pulmonary function test with and without bronchodilators. Pulmonary Function Tests are a group of tests that measure how well air moves in and out of your lungs.    Follow-Up: Your physician recommends that you schedule a follow-up appointment as needed with Dr. Tamala Julian.   Any Other Special Instructions Will Be Listed Below (If Applicable).     If you need a refill on your cardiac medications before your next appointment, please call your pharmacy.

## 2017-08-25 ENCOUNTER — Ambulatory Visit (INDEPENDENT_AMBULATORY_CARE_PROVIDER_SITE_OTHER): Payer: Medicare Other | Admitting: Internal Medicine

## 2017-08-25 DIAGNOSIS — R0609 Other forms of dyspnea: Secondary | ICD-10-CM

## 2017-08-25 LAB — PULMONARY FUNCTION TEST
DL/VA % pred: 73 %
DL/VA: 3.49 ml/min/mmHg/L
DLCO UNC % PRED: 59 %
DLCO unc: 21.13 ml/min/mmHg
FEF 25-75 Post: 2.73 L/sec
FEF 25-75 Pre: 2.65 L/sec
FEF2575-%Change-Post: 3 %
FEF2575-%Pred-Post: 112 %
FEF2575-%Pred-Pre: 108 %
FEV1-%Change-Post: 0 %
FEV1-%PRED-POST: 88 %
FEV1-%Pred-Pre: 88 %
FEV1-Post: 2.69 L
FEV1-Pre: 2.69 L
FEV1FVC-%CHANGE-POST: 2 %
FEV1FVC-%Pred-Pre: 106 %
FEV6-%Change-Post: -2 %
FEV6-%PRED-PRE: 86 %
FEV6-%Pred-Post: 84 %
FEV6-PRE: 3.36 L
FEV6-Post: 3.28 L
FEV6FVC-%Change-Post: 0 %
FEV6FVC-%PRED-PRE: 105 %
FEV6FVC-%Pred-Post: 105 %
FVC-%Change-Post: -2 %
FVC-%PRED-PRE: 82 %
FVC-%Pred-Post: 80 %
FVC-POST: 3.28 L
FVC-PRE: 3.37 L
POST FEV6/FVC RATIO: 100 %
PRE FEV6/FVC RATIO: 100 %
Post FEV1/FVC ratio: 82 %
Pre FEV1/FVC ratio: 80 %

## 2017-08-25 NOTE — Progress Notes (Signed)
PFT done today. 

## 2017-08-31 ENCOUNTER — Ambulatory Visit (INDEPENDENT_AMBULATORY_CARE_PROVIDER_SITE_OTHER): Payer: Medicare Other | Admitting: Podiatry

## 2017-08-31 ENCOUNTER — Encounter: Payer: Self-pay | Admitting: Podiatry

## 2017-08-31 DIAGNOSIS — G5762 Lesion of plantar nerve, left lower limb: Secondary | ICD-10-CM

## 2017-08-31 NOTE — Progress Notes (Signed)
He states that his injection was doing very gotten to the last 3 days when it started to really bother him as he refers to the neuroma third interdigital space of the left foot.  Objective: Pulses remain barely palpable he still has pain on palpation to the proximal most incision site of an old neurectomy. He still has pain on the toes #3 and number for the left foot.  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  Plan: Injected the area today with dehydrated alcohol continued follow-up with him as needed.

## 2017-09-14 ENCOUNTER — Emergency Department (HOSPITAL_COMMUNITY): Payer: Medicare Other

## 2017-09-14 ENCOUNTER — Emergency Department (HOSPITAL_COMMUNITY)
Admission: EM | Admit: 2017-09-14 | Discharge: 2017-09-14 | Disposition: A | Discharge status: Home / Self Care | Payer: Medicare Other | Attending: Emergency Medicine | Admitting: Emergency Medicine

## 2017-09-14 ENCOUNTER — Encounter (HOSPITAL_COMMUNITY): Payer: Self-pay

## 2017-09-14 DIAGNOSIS — R55 Syncope and collapse: Secondary | ICD-10-CM

## 2017-09-14 DIAGNOSIS — I2510 Atherosclerotic heart disease of native coronary artery without angina pectoris: Secondary | ICD-10-CM | POA: Insufficient documentation

## 2017-09-14 DIAGNOSIS — J449 Chronic obstructive pulmonary disease, unspecified: Secondary | ICD-10-CM | POA: Diagnosis not present

## 2017-09-14 DIAGNOSIS — I10 Essential (primary) hypertension: Secondary | ICD-10-CM | POA: Insufficient documentation

## 2017-09-14 DIAGNOSIS — R0789 Other chest pain: Secondary | ICD-10-CM | POA: Diagnosis present

## 2017-09-14 DIAGNOSIS — E119 Type 2 diabetes mellitus without complications: Secondary | ICD-10-CM | POA: Diagnosis not present

## 2017-09-14 DIAGNOSIS — F17210 Nicotine dependence, cigarettes, uncomplicated: Secondary | ICD-10-CM | POA: Diagnosis not present

## 2017-09-14 DIAGNOSIS — Z79899 Other long term (current) drug therapy: Secondary | ICD-10-CM | POA: Insufficient documentation

## 2017-09-14 LAB — COMPREHENSIVE METABOLIC PANEL
ALBUMIN: 3.8 g/dL (ref 3.5–5.0)
ALK PHOS: 55 U/L (ref 38–126)
ALT: 14 U/L — ABNORMAL LOW (ref 17–63)
ANION GAP: 15 (ref 5–15)
AST: 19 U/L (ref 15–41)
BILIRUBIN TOTAL: 0.5 mg/dL (ref 0.3–1.2)
BUN: 26 mg/dL — ABNORMAL HIGH (ref 6–20)
CALCIUM: 9.2 mg/dL (ref 8.9–10.3)
CO2: 15 mmol/L — ABNORMAL LOW (ref 22–32)
Chloride: 105 mmol/L (ref 101–111)
Creatinine, Ser: 1.61 mg/dL — ABNORMAL HIGH (ref 0.61–1.24)
GFR, EST AFRICAN AMERICAN: 46 mL/min — AB (ref >60)
GFR, EST NON AFRICAN AMERICAN: 40 mL/min — AB (ref >60)
GLUCOSE: 92 mg/dL (ref 65–99)
POTASSIUM: 4.4 mmol/L (ref 3.5–5.1)
Sodium: 135 mmol/L (ref 135–145)
TOTAL PROTEIN: 6.6 g/dL (ref 6.5–8.1)

## 2017-09-14 LAB — TROPONIN I

## 2017-09-14 LAB — CBC
HEMATOCRIT: 31 % — AB (ref 39.0–52.0)
HEMOGLOBIN: 9.5 g/dL — AB (ref 13.0–17.0)
MCH: 24.2 pg — AB (ref 26.0–34.0)
MCHC: 30.6 g/dL (ref 30.0–36.0)
MCV: 79.1 fL (ref 78.0–100.0)
Platelets: 391 10*3/uL (ref 150–400)
RBC: 3.92 MIL/uL — ABNORMAL LOW (ref 4.22–5.81)
RDW: 17.1 % — AB (ref 11.5–15.5)
WBC: 9.7 10*3/uL (ref 4.0–10.5)

## 2017-09-14 NOTE — ED Provider Notes (Signed)
[facility] DEPT Provider Note   CSN: [id number] Arrival date & time: 09/14/17  1621     History   Chief Complaint Chief Complaint  Patient presents with  . Loss of Consciousness  . Chest Pain    HPI OSHEN WLODARCZYK is a 76 y.o. male.  HPI Patient presents to the emergency department after a syncopal episode while he was sitting on his front porch.  When EMS arrived he was complaining of right-sided chest discomfort.  He reports she has no right-sided chest pain at this time.  He states that several similar episodes before without clear etiology found.  No preceding chest pain or preceding palpitations.  Denies fevers and chills.  Asymptomatic at this time.  He states he is otherwise in his normal state of health earlier today.   Past Medical History:  Diagnosis Date  . AKI (acute kidney injury) ([place]) 06/23/2017  . Arthritis    SHOUDLERS, LUMBAR  . BPH with obstruction/lower urinary tract symptoms   . BPH with urinary obstruction 01/15/2016  . COPD (chronic obstructive pulmonary disease) ([place])   . Coronary atherosclerosis    denied knowledge of this 10/27/16  . Depression   . Diverticulosis of colon   . Dyspnea   . ED (erectile dysfunction)   . Full dentures   . GERD (gastroesophageal reflux disease)    not current  . Glaucoma   . History of gastritis   . History of scabies    2008  . 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 History of syncope    03-22-2014  DX  VAGAL RESPONSE  . Hyperlipidemia, mixed   . Hypertension   . Mitral regurgitation   . Peripheral arterial disease ([place])    one vessel runoff bilaterally via peroneal arteries  . Prostate cancer ([place])   . Seizures ([place]) 03/2014   ? or syncope- patient refused to be admitted for further studies- "felt better"  . Type 2 diabetes, diet controlled ([place])    patient and his wife said no- have not been told 01/21/17  . Wears glasses     Patient Active Problem List   Diagnosis Date  Noted  . Chest pain 06/23/2017  . AKI (acute kidney injury) ([place]) 06/23/2017  . Acute respiratory insufficiency   . Surgery, elective   . Radiculopathy 01/27/2017  . Cervical radiculopathy 01/27/2017  . Peripheral arterial disease ([place]) 01/23/2016  . Fever 02/22/2015  . Epididymitis, left 02/22/2015  . Cellulitis 02/22/2015  . Orchitis 02/22/2015  . Testicular abscess 02/22/2015  . DM type 2 (diabetes mellitus, type 2) ([place]) 02/22/2015  . Microcytic anemia 02/22/2015  . Epididymitis 02/22/2015  . UTI (lower urinary tract infection) 02/22/2015  . Hypotension 03/22/2014  . Neuroma digital nerve 09/28/2013  . 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 Dehydration 07/03/2010  . NAUSEA AND VOMITING 07/03/2010  . BENIGN PROSTATIC HYPERTROPHY, WITH URINARY OBSTRUCTION 06/13/2010  . ARM PAIN 06/03/2010  . DYSPNEA ON EXERTION 05/15/2010  . TOBACCO ABUSE 04/03/2010  . COPD 04/03/2010  . G E R D 04/03/2010  . OTHER DYSPHAGIA 04/03/2010  . PNEUMONIA, RIGHT LOWER LOBE 02/19/2010  . DIABETES MELLITUS, TYPE II, CONTROLLED 01/14/2010  . LUMBAR RADICULOPATHY, LEFT 05/16/2009  . DYSPHAGIA UNSPECIFIED 05/16/2009  . COUGH DUE TO ACE INHIBITORS 04/02/2009  . DIZZINESS 07/27/2008  . HYPERCHOLESTEROLEMIA, MIXED 05/11/2008  . CATARACTS, BILATERAL 03/01/2008  . MILD SPINAL STENOSIS, CERVICAL 01/12/2008  .  LEG CRAMPS 01/12/2008  . EARLY SATIETY 11/04/2007  . DIVERTICULOSIS, COLON 08/31/2007  . SCABIES 08/26/2007  . ERECTILE DYSFUNCTION 08/18/2007  . ABUSE, ALCOHOL SUSPECTED 08/18/2007  . Essential hypertension 08/17/2007  . HEMOCCULT POSITIVE STOOL 07/05/2007  . ANEMIA, IRON DEFICIENCY NOS 05/31/2007  . WEIGHT LOSS, ABNORMAL 05/10/2006    Past Surgical History:  Procedure Laterality Date  . ANTERIOR CERVICAL DECOMP/DISCECTOMY FUSION  10-16-2009   C4 -- C6  . ANTERIOR CERVICAL DECOMP/DISCECTOMY FUSION N/A 01/27/2017   Procedure: ANTERIOR CERVICAL DECOMPRESSION FUSION  CERVICAL 3-4 WITH INSTRUMENTATION AND ALLOGRAFT;  Surgeon: Phylliss Bob, MD;  Location: [facility];  Service: Orthopedics;  Laterality: N/A;  ANTERIOR CERVICAL DECOMPRESSION FUSION CERVICAL 3-4 WITH INSTRUMENTATION AND ALLOGRAFT  . CAPSULOTOMY Right 01/26/2013   Procedure: MINOR CAPSULOTOMY;  Surgeon: Myrtha Mantis., MD;  Location: [facility];  Service: Ophthalmology;  Laterality: Right;  . CARPECTOMY Right 03/13/2014   Procedure: RIGHT  PROXIMAL ROW CARPECTOMY;  Surgeon: Tennis Must, MD;  Location: [facility];  Service: Orthopedics;  Laterality: Right;  . CARPECTOMY Left 10/22/2015   Procedure: LEFT WRIST PROXIMAL ROW CARPECTOMY ;  Surgeon: Leanora Cover, MD;  Location: [facility];  Service: Orthopedics;  Laterality: Left;  . CARPOMETACARPAL ([place]) FUSION OF THUMB Right 03/13/2014   Procedure: RIGHT FUSION OF THUMB CARPOMETACARPAL [facility]) JOINT;  Surgeon: Tennis Must, MD;  Location: [facility];  Service: Orthopedics;  Laterality: Right;  . CATARACT EXTRACTION W/ INTRAOCULAR LENS  IMPLANT, BILATERAL  2009  . COLONOSCOPY  08-31-2007  . COLONOSCOPY    . CRYOABLATION N/A 01/14/2015   Procedure: CRYO ABLATION PROSTATE;  Surgeon: Ailene Rud, MD;  Location: [facility];  Service: Urology;  Laterality: N/A;  . ESOPHAGOGASTRODUODENOSCOPY  last one 09-11-2008  . EXCISION MASS LEFT FACE , SUBORBITAL AREA, PLASTER RECONSTRUCTION  02-09-2011  . EXCISIONAL DEBRIDEMENT AND REPAIR RIGHT QUADRICEP TENDON  07-05-2000  . FOOT SURGERY Left   . I&D EXTREMITY Right 05/24/2013   Procedure: RIGHT INDEX WOUND DEBRIDEMENT AND CLOSURE;  Surgeon: Jolyn Nap, MD;  Location: [facility];  Service: Orthopedics;  Laterality: Right;  . INSERTION OF SUPRAPUBIC CATHETER N/A 01/14/2015   Procedure: SUPRAPUBIC TUBE PLACEMENT;  Surgeon: Ailene Rud, MD;  Location[facility];  Service: Urology;  Laterality: N/A;  . KNEE  ARTHROSCOPY Right 2008  . LARYNGOSCOPY AND ESOPHAGOSCOPY  06-13-2010   MARSUPIALIZATION LEFT LARGE VALLECULAR CYST  . MASS EXCISION Left 10/22/2015   Procedure: EXCISION MASS OF LEFT INDEX FINGER;  Surgeon: Leanora Cover, MD;  Location: [facility];  Service: Orthopedics;  Laterality: Left;  . ORCHIECTOMY Left 02/24/2015   Procedure: SCROTAL EXPLORATION WITH LEFT ORCHIECTOMY;  Surgeon: Kathie Rhodes, MD;  Location: [facility];  Service: Urology;  Laterality: Left;  . POSTERIOR CERVICAL FUSION/FORAMINOTOMY N/A 01/28/2017   Procedure: POSTERIOR SPINAL FUSION CERVICAL 3-4, CERVICAL 4-5, CERVICAL 5-6, CERVICAL 6-7 WITH INSTRUMENATION AND ALLOGRAFT;  Surgeon: Phylliss Bob, MD;  Location: [facility];  Service: Orthopedics;  Laterality: N/A;  POSTERIOR SPINAL FUSION CERVICAL 3-4, CERVICAL 4-5, CERVICAL 5-6, CERVICAL 6-7 WITH INSTRUMENATION AND ALLOGRAFT  . SHOULDER ARTHROSCOPY/ DEBRIDEMENT LABRAL TEAR/  BICEPS TENOTOMY Left 09-24-2011  . TONSILLECTOMY  as child  . TRANSTHORACIC ECHOCARDIOGRAM  02-21-2010   normal LVF/  ef 55-60%/ mild to moderate MR/  moderate LAE/  mild TR  . YAG LASER APPLICATION Right 05/24/159   Procedure: YAG LASER APPLICATION;  Surgeon: Myrtha Mantis., MD;  Location: [facility];  Service: Ophthalmology;  Laterality: Right;  . YAG LASER CAPSULOTOMY, LEFT EYE  01-08-2011       Home Medications    Prior to Admission medications   Medication Sig Start Date End Date Taking? Authorizing Provider  AZOPT 1 % ophthalmic suspension Place 1 drop into the left eye 3 (three) times daily.  12/25/16   [provider]  bimatoprost (LUMIGAN) 0.01 % SOLN Place 1 drop into both eyes at bedtime. 04/10/13   Dhungel, Nishant, MD  brimonidine (ALPHAGAN) 0.2 % ophthalmic solution Place 1 drop into both eyes 2 (two) times daily. 06/25/17   Aline August, MD  brimonidine-timolol (COMBIGAN) 0.2-0.5 % ophthalmic solution Place 1 drop into both eyes 2 (two) times daily.     [provider]  esomeprazole (NEXIUM) 40 MG capsule Take 1 capsule (40 mg total) by mouth daily. 07/16/17   Isaiah Serge, NP  gabapentin (NEURONTIN) 300 MG capsule Take 300 mg by mouth 2 (two) times daily.  10/19/16   [provider]  HYDROcodone-acetaminophen (NORCO) 7.5-325 MG tablet Take 1 tablet by mouth every 8 (eight) hours as needed for moderate pain. pain 12/02/15   [provider]  losartan (COZAAR) 100 MG tablet Take 100 mg by mouth daily. 06/08/17   [provider]  meclizine (ANTIVERT) 25 MG tablet Take 25 mg by mouth daily. 06/16/17   [provider]  Multiple Vitamins-Minerals (CENTRUM SILVER ADULT 50+ PO) Take 1 tablet by mouth daily.    [provider]  oxybutynin (DITROPAN) 5 MG tablet TAKE 1 TABLET EVERY 12 HOURS    [provider]  pravastatin (PRAVACHOL) 80 MG tablet Take 80 mg by mouth daily.  10/19/16   [provider]  tamsulosin (FLOMAX) 0.4 MG CAPS capsule Take 0.4 mg by mouth at bedtime.  12/03/16   [provider]    Family History Family History  Problem Relation Age of Onset  . Unexplained death Mother        died at a young age, no known cause  . Heart attack Father        age unknown  . Diabetes Unknown   . Cancer Unknown     Social History Social History  Substance Use Topics  . Smoking status: Current Every Day Smoker    Packs/day: 1.00    Years: 59.00    Types: Cigarettes  . Smokeless tobacco: Never Used     Comment: Started Chantix 10/26/16  . Alcohol use No     Comment: none since approx 2014- heavy before     Allergies   Patient has no active allergies.   Review of Systems Review of Systems  All other systems reviewed and are negative.    Physical Exam Updated Vital Signs BP 110/66   Pulse (!) 57   Temp 97.7 F (36.5 C) (Oral)   Resp 15   Wt 72.6 kg (160 lb)   SpO2 100%   BMI 20.54 kg/m   Physical Exam  Constitutional: He is oriented to person, place, and time.  He appears well-developed and well-nourished.  HENT:  Head: Normocephalic and atraumatic.  Eyes: EOM are normal.  Neck: Normal range of motion.  Cardiovascular: Normal rate, regular rhythm, normal heart sounds and intact distal pulses.   Pulmonary/Chest: Effort normal and breath sounds normal. No respiratory distress.  Abdominal: Soft. He exhibits no distension. There is no tenderness.  Musculoskeletal: Normal range of motion.  Neurological: He is alert and oriented  to person, place, and time.  Skin: Skin is warm and dry.  Psychiatric: He has a normal mood and affect. Judgment normal.  Nursing note and vitals reviewed.    ED Treatments / Results  Labs (all labs ordered are listed, but only abnormal results are displayed) Labs Reviewed  CBC - Abnormal; Notable for the following:       Result Value   RBC 3.92 (*)    Hemoglobin 9.5 (*)    HCT 31.0 (*)    MCH 24.2 (*)    RDW 17.1 (*)    All other components within normal limits  COMPREHENSIVE METABOLIC PANEL  TROPONIN I    EKG  EKG Interpretation  Date/Time:  Tuesday September 14 2017 16:22:16 EDT Ventricular Rate:  46 PR Interval:  180 QRS Duration: 82 QT Interval:  496 QTC Calculation: 434 R Axis:   62 Text Interpretation:  Sinus bradycardia Nonspecific ST abnormality Abnormal ECG No significant change was found Confirmed by Jola Schmidt [phone]) on 09/14/2017 7:36:45 PM       Radiology Dg Chest 2 View  Result Date: 09/14/2017 CLINICAL DATA:  Syncope while in the yard today, history COPD, coronary artery disease, dyspnea, hypertension, type II diabetes mellitus, smoker EXAM: CHEST  2 VIEW COMPARISON:  06/27/2017 FINDINGS: Normal heart size, mediastinal contours, and pulmonary vascularity. Atherosclerotic calcification aorta. Lungs appear mildly emphysematous but clear. No pulmonary infiltrate, pleural effusion or pneumothorax. Bones demineralized. IMPRESSION: Probable mild emphysematous changes without acute infiltrate.  Electronically Signed   By: Lavonia Dana M.D.   On: 09/14/2017 17:48    Procedures Procedures (including critical care time)  Medications Ordered in ED Medications - No data to display   Initial Impression / Assessment and Plan / ED Course  I have reviewed the triage vital signs and the nursing notes.  Pertinent labs & imaging results that were available during my care of the patient were reviewed by me and considered in my medical decision making (see chart for details).     Patient with presentation consistent with syncope.  EKG without ischemic changes.  While the patient's workup was ensuing in the emergency department he decided to elope from the emergency department.  I was unable to discuss risks with the patient  Final Clinical Impressions(s) / ED Diagnoses   Final diagnoses:  None    New Prescriptions Discharge Medication List as of 09/14/2017  7:22 PM       Jola Schmidt, MD 09/14/17 [phone]

## 2017-09-14 NOTE — ED Notes (Signed)
Pt's family came back to room unhooked pt and brought to bathroom.  On way back to room pt's family bunched up the back of pts sweatshirt and led pt out to lobby through doors they used silver button to open for him.  Called out to pt and family and they kept walking.  EMT Marya Amsler went after pt to check for IV in left arm while this RN checked room and trash for IV.  IV still in patient removed up front by Gerald Stabs, EMT.  Pt and family aware of risks leaving AMA and advised to come back in any additional concerns or symptoms.

## 2017-09-14 NOTE — ED Notes (Signed)
IV attempt x2 by EMS IV attept x1 RN

## 2017-09-14 NOTE — ED Notes (Signed)
See blank note in chart

## 2017-09-14 NOTE — ED Triage Notes (Signed)
Pt arrived via [facility] from home after a witnessed syncopal episode while sitting on the front porch.  Pt diaphoretic on EMS arrival c/o new right sided chest pain.  EMS gave 324mg  ASA.  CBG 150

## 2017-09-21 ENCOUNTER — Ambulatory Visit (INDEPENDENT_AMBULATORY_CARE_PROVIDER_SITE_OTHER): Payer: Medicare Other

## 2017-09-21 ENCOUNTER — Encounter: Payer: Self-pay | Admitting: Podiatry

## 2017-09-21 ENCOUNTER — Ambulatory Visit (INDEPENDENT_AMBULATORY_CARE_PROVIDER_SITE_OTHER): Payer: Medicare Other | Admitting: Podiatry

## 2017-09-21 DIAGNOSIS — S92522A Displaced fracture of medial phalanx of left lesser toe(s), initial encounter for closed fracture: Secondary | ICD-10-CM

## 2017-09-21 DIAGNOSIS — S90122A Contusion of left lesser toe(s) without damage to nail, initial encounter: Secondary | ICD-10-CM | POA: Diagnosis not present

## 2017-09-21 MED ORDER — HYDROCODONE-ACETAMINOPHEN 7.5-325 MG PO TABS: 1.0000 | ORAL_TABLET | Freq: Three times a day (TID) | ORAL | 0 refills | Status: DC | PRN

## 2017-09-21 NOTE — Progress Notes (Signed)
Mr. work and presents today chief complaint of painful second toe where he dropped a transmission on his toe injuring it. He states it hurts so bad I can hardly stand. He states and no appropriate. He states that there is other nerve still hurts over here also.  Objective: Vital signs are stable he is alert and oriented 3 dislocated second toe once reviewed on radiograph does demonstrate an arthrodesis of a hammertoe that has fractured just at the distalmost aspect of the proximal phalanx. This is distally and dorsally laterally low dislocated. He still has pain on palpation of the third interdigital space.  Assessment: Dislocated fracture proximal phalanx second digit left. Neuroma third interdigital space left.  Plan: After local anesthesia was achieved we closed reduced the fracture. The fracture is very unstable and was dressed with a splint. He was then placed in a Darco shoe after injection to the third interdigital space with dehydrated alcohol. I will follow-up with him in 1 month for another set of x-rays. We did place him in a Darco shoe.

## 2017-09-23 ENCOUNTER — Ambulatory Visit (INDEPENDENT_AMBULATORY_CARE_PROVIDER_SITE_OTHER): Payer: Medicare Other | Admitting: Podiatry

## 2017-09-23 ENCOUNTER — Ambulatory Visit: Payer: Medicare Other | Admitting: Podiatry

## 2017-09-23 ENCOUNTER — Encounter: Payer: Self-pay | Admitting: Podiatry

## 2017-09-23 DIAGNOSIS — S92522A Displaced fracture of medial phalanx of left lesser toe(s), initial encounter for closed fracture: Secondary | ICD-10-CM

## 2017-09-24 NOTE — Progress Notes (Signed)
He presents today for follow-up of his dislocated fracture second toe of his left foot. He states that my pain medicines not even working was toe is just killing me.  Objective: Took the dressing off the toe and applied a lighter dressing with bracing and splinting. He will continue to wear the Darco shoe.  Assessment: Fractured second digit left foot.  Plan: [place] today follow up with me in 1 month.

## 2017-09-30 ENCOUNTER — Telehealth: Payer: Self-pay | Admitting: Podiatry

## 2017-09-30 MED ORDER — HYDROCODONE-ACETAMINOPHEN 7.5-325 MG PO TABS: ORAL_TABLET | ORAL | 0 refills | Status: DC

## 2017-09-30 NOTE — Addendum Note (Signed)
Addended by: Harriett Sine D on: 09/30/2017 11:12 AM   Modules accepted: Orders

## 2017-09-30 NOTE — Telephone Encounter (Signed)
I would like a refill of my pain medication. My toe is killing me from the pain. You can reach me at [phone]. Thank you.

## 2017-09-30 NOTE — Telephone Encounter (Addendum)
I spoke with pt's wife, [name] and informed that if pt was taking the pain medication as directed we could only refill on 10/01/2017. I informed pt that he was prescribed Hydrocodone to get him until 10/02/2017. I told pt I could refill and date rx to be filled on 10/02/2017 if he wanted to pick up today before 1:00pm.

## 2017-09-30 NOTE — Telephone Encounter (Signed)
Ok

## 2017-10-05 ENCOUNTER — Ambulatory Visit (INDEPENDENT_AMBULATORY_CARE_PROVIDER_SITE_OTHER): Payer: Medicare Other

## 2017-10-05 ENCOUNTER — Ambulatory Visit (INDEPENDENT_AMBULATORY_CARE_PROVIDER_SITE_OTHER): Payer: Medicare Other | Admitting: Podiatry

## 2017-10-05 DIAGNOSIS — M779 Enthesopathy, unspecified: Secondary | ICD-10-CM

## 2017-10-05 DIAGNOSIS — M778 Other enthesopathies, not elsewhere classified: Secondary | ICD-10-CM

## 2017-10-05 DIAGNOSIS — M7752 Other enthesopathy of left foot: Secondary | ICD-10-CM | POA: Diagnosis not present

## 2017-10-05 DIAGNOSIS — S92522A Displaced fracture of medial phalanx of left lesser toe(s), initial encounter for closed fracture: Secondary | ICD-10-CM

## 2017-10-05 DIAGNOSIS — G5762 Lesion of plantar nerve, left lower limb: Secondary | ICD-10-CM

## 2017-10-05 NOTE — Progress Notes (Signed)
He presents today for follow-up of her fracture to the second digit of the left foot. He states it is starting to feel much better. He still has pain third interspace left foot.   Objective: Vital signs are stable at 3 total decrease in edema considerably he still has pain on palpation third and interspace of the left foot.  Assessment: Neuroma third interspace left foot well-healing fracture proximal phalanx second digit left foot.  Plan: Continue to wrap the toe on a daily basis and also recommended another injection to the third and interspace. We injected dehydrated alcohol to the point of maximal tenderness.

## 2017-11-02 ENCOUNTER — Encounter: Payer: Self-pay | Admitting: Podiatry

## 2017-11-02 ENCOUNTER — Ambulatory Visit (INDEPENDENT_AMBULATORY_CARE_PROVIDER_SITE_OTHER): Payer: Medicare Other | Admitting: Podiatry

## 2017-11-02 ENCOUNTER — Ambulatory Visit (INDEPENDENT_AMBULATORY_CARE_PROVIDER_SITE_OTHER): Payer: Medicare Other

## 2017-11-02 DIAGNOSIS — S92522D Displaced fracture of medial phalanx of left lesser toe(s), subsequent encounter for fracture with routine healing: Secondary | ICD-10-CM

## 2017-11-02 DIAGNOSIS — G5762 Lesion of plantar nerve, left lower limb: Secondary | ICD-10-CM

## 2017-11-02 MED ORDER — HYDROCODONE-ACETAMINOPHEN 7.5-325 MG PO TABS: 1.0000 | ORAL_TABLET | Freq: Three times a day (TID) | ORAL | 0 refills | Status: DC | PRN

## 2017-11-02 NOTE — Progress Notes (Signed)
He presents today for follow-up of his fractured toe and pain to the fourth toe of the left foot.  Objective: Vital signs are stable he is alert and oriented 3. Pulses are palpable. He has pain on palpation third interspace left foot second toe appears to be healing very well radiographs taken today demonstrate a well healing fractured toe. It isn't malalignment but this really doesn't bother him.  Assessment: Neuroma third interspace left healing fracture second toe left.  Plan: We injected another dose of dehydrated alcohol today but a prescription for Vicodin follow-up with him in 1 month or so.

## 2017-11-23 ENCOUNTER — Ambulatory Visit (INDEPENDENT_AMBULATORY_CARE_PROVIDER_SITE_OTHER): Payer: Medicare Other | Admitting: Podiatry

## 2017-11-23 ENCOUNTER — Encounter: Payer: Self-pay | Admitting: Podiatry

## 2017-11-23 DIAGNOSIS — G5762 Lesion of plantar nerve, left lower limb: Secondary | ICD-10-CM | POA: Diagnosis not present

## 2017-11-23 MED ORDER — HYDROCODONE-ACETAMINOPHEN 7.5-325 MG PO TABS: 1.0000 | ORAL_TABLET | Freq: Three times a day (TID) | ORAL | 0 refills | Status: DC | PRN

## 2017-11-23 NOTE — Progress Notes (Signed)
He presents today for follow-up of neuritis neuroma to the third interdigital space of the left foot.  He states that it is still bothering me.  Objective: Vital signs are stable he is alert and oriented x3.  Peripheral vascular disease.  He still has pain on palpation to the third interdigital space of the proximal area of his old incision.  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  Concentration is approximately 4%.  I also wrote him a prescription for hydrocodone.  Follow-up with him in 1 month.

## 2017-12-23 ENCOUNTER — Ambulatory Visit (INDEPENDENT_AMBULATORY_CARE_PROVIDER_SITE_OTHER): Payer: Medicare Other | Admitting: Podiatry

## 2017-12-23 ENCOUNTER — Encounter: Payer: Self-pay | Admitting: Podiatry

## 2017-12-23 ENCOUNTER — Telehealth: Payer: Self-pay | Admitting: *Deleted

## 2017-12-23 DIAGNOSIS — M79605 Pain in left leg: Secondary | ICD-10-CM

## 2017-12-23 DIAGNOSIS — G5762 Lesion of plantar nerve, left lower limb: Secondary | ICD-10-CM

## 2017-12-23 DIAGNOSIS — R0989 Other specified symptoms and signs involving the circulatory and respiratory systems: Secondary | ICD-10-CM

## 2017-12-23 DIAGNOSIS — M79604 Pain in right leg: Secondary | ICD-10-CM

## 2017-12-23 MED ORDER — HYDROCODONE-ACETAMINOPHEN 7.5-325 MG PO TABS: 1.0000 | ORAL_TABLET | Freq: Three times a day (TID) | ORAL | 0 refills | Status: DC | PRN

## 2017-12-23 NOTE — Telephone Encounter (Signed)
-----   Message from Rip Harbour, [facility] sent at 12/23/2017 11:53 AM EST ----- Regarding: Vascular Arterial studies including ABI's and consult - non palpable pulses, pain in limb

## 2017-12-23 NOTE — Telephone Encounter (Addendum)
Faxed orders to [facility]. Faxed clinicals and Demographics to [facility].

## 2017-12-25 NOTE — Progress Notes (Signed)
He presents today stating that his foot is still hurting him.  Still has pain right in here she points to the third interdigital space.  Also note while we cannot do something surgically to help him.  Objective: Vital signs are stable he is alert and oriented x3 pulses are nonpalpable to the left lower extremity.  Still has pain on palpation to the proximal end of already excised neuroma.  Assessment peripheral vascular disease chronic neuroma pain left.  Plan: Injected another dose of dehydrated alcohol wrote a prescription for pain medication referred him to vascular for evaluation of blood flow.

## 2017-12-28 ENCOUNTER — Telehealth: Payer: Self-pay | Admitting: *Deleted

## 2017-12-28 NOTE — Telephone Encounter (Signed)
Faxed referral, clinicals and demographics to [facility]. 

## 2017-12-30 ENCOUNTER — Ambulatory Visit (HOSPITAL_COMMUNITY)
Admission: RE | Admit: 2017-12-30 | Discharge: 2017-12-30 | Disposition: A | Discharge status: Home / Self Care | Payer: Medicare Other | Source: Ambulatory Visit | Attending: Cardiology | Admitting: Cardiology

## 2017-12-30 DIAGNOSIS — E785 Hyperlipidemia, unspecified: Secondary | ICD-10-CM | POA: Diagnosis not present

## 2017-12-30 DIAGNOSIS — E119 Type 2 diabetes mellitus without complications: Secondary | ICD-10-CM | POA: Insufficient documentation

## 2017-12-30 DIAGNOSIS — F17200 Nicotine dependence, unspecified, uncomplicated: Secondary | ICD-10-CM | POA: Insufficient documentation

## 2017-12-30 DIAGNOSIS — M79605 Pain in left leg: Secondary | ICD-10-CM | POA: Diagnosis not present

## 2017-12-30 DIAGNOSIS — M79604 Pain in right leg: Secondary | ICD-10-CM | POA: Diagnosis not present

## 2017-12-30 DIAGNOSIS — I10 Essential (primary) hypertension: Secondary | ICD-10-CM | POA: Diagnosis not present

## 2017-12-30 DIAGNOSIS — R0989 Other specified symptoms and signs involving the circulatory and respiratory systems: Secondary | ICD-10-CM | POA: Diagnosis not present

## 2017-12-30 DIAGNOSIS — J449 Chronic obstructive pulmonary disease, unspecified: Secondary | ICD-10-CM | POA: Insufficient documentation

## 2018-01-11 ENCOUNTER — Telehealth: Payer: Self-pay | Admitting: Podiatry

## 2018-01-11 MED ORDER — HYDROCODONE-ACETAMINOPHEN 7.5-325 MG PO TABS: ORAL_TABLET | ORAL | 0 refills | Status: DC

## 2018-01-11 NOTE — Telephone Encounter (Signed)
Ok to refill this time 

## 2018-01-11 NOTE — Telephone Encounter (Signed)
Pt called asking for a refill on his pain medication.

## 2018-01-11 NOTE — Telephone Encounter (Signed)
I informed pt Dr.Hyatt had refilled the pain medication and he could pick it up in the [place] office.

## 2018-01-11 NOTE — Telephone Encounter (Signed)
I informed pt I would ask Dr. Milinda Pointer for refill of the hydrocodone/acetaminophen 7.5mg  and call again.

## 2018-02-08 ENCOUNTER — Telehealth: Payer: Self-pay | Admitting: *Deleted

## 2018-02-08 ENCOUNTER — Encounter: Payer: Self-pay | Admitting: Cardiovascular Disease

## 2018-02-08 ENCOUNTER — Ambulatory Visit (INDEPENDENT_AMBULATORY_CARE_PROVIDER_SITE_OTHER): Payer: Self-pay | Admitting: Cardiovascular Disease

## 2018-02-08 DIAGNOSIS — I739 Peripheral vascular disease, unspecified: Secondary | ICD-10-CM

## 2018-02-08 MED ORDER — HYDROCODONE-ACETAMINOPHEN 7.5-325 MG PO TABS: ORAL_TABLET | ORAL | 0 refills | Status: DC

## 2018-02-08 NOTE — Telephone Encounter (Signed)
I told Russell [name] Dr. Milinda Pointer had okayed the refill of the [place], and Russell [name] stated he thought that toe might have to come off. I offered Russell [name] an earlier appt and he states he has too many appt, will keep the 03/03/2018 appt. I told Russell [name] that if he changed his mind to call again.

## 2018-02-08 NOTE — Telephone Encounter (Signed)
Pt called requesting pain medication. I reviewed LOV 12/23/2017 pt was referred to [facility] by Dr. Milinda Pointer for diminished pulses.

## 2018-02-08 NOTE — Telephone Encounter (Signed)
Ok to refill 

## 2018-02-08 NOTE — Progress Notes (Signed)
The patient was referred for Doppler study that showed normal ABI and toe pressure. He was evaluated by Dr. Gwenlyn Found in 2017 and no intervention was needed. Based on this, his appointment with me today was canceled.  No charge.

## 2018-02-08 NOTE — Telephone Encounter (Signed)
[place] States pt was scheduled for 02/08/2018 and appt was cancelled by their office, Dr. Fletcher Anon reviewed pt's doppler performed by Dr. Gwenlyn Found and said everything was okay, pt was in the lobby and was informed by Dr. Tyrell Antonio nurse of the doppler results and was okay with cancelling today appt.

## 2018-02-10 ENCOUNTER — Telehealth: Payer: Self-pay | Admitting: Podiatry

## 2018-02-10 NOTE — Telephone Encounter (Signed)
I want to speak to Dr. Milinda Pointer about my pain medicine. Telephone number is [phone]. Get in touch as soon as possible. Thank you.

## 2018-02-10 NOTE — Telephone Encounter (Signed)
This is Mohammed Mcandrew calling again. Telephone number [phone]. Please give me a call as soon as possible. Thank you.

## 2018-02-10 NOTE — Telephone Encounter (Signed)
Spoke with patient explaining the narcotic dispensing laws, he verbalized understanding

## 2018-03-03 ENCOUNTER — Ambulatory Visit: Payer: Medicare Other

## 2018-03-03 ENCOUNTER — Ambulatory Visit (INDEPENDENT_AMBULATORY_CARE_PROVIDER_SITE_OTHER): Payer: Medicare Other | Admitting: Podiatry

## 2018-03-03 ENCOUNTER — Encounter: Payer: Self-pay | Admitting: Podiatry

## 2018-03-03 DIAGNOSIS — S92522D Displaced fracture of medial phalanx of left lesser toe(s), subsequent encounter for fracture with routine healing: Secondary | ICD-10-CM

## 2018-03-03 DIAGNOSIS — R0989 Other specified symptoms and signs involving the circulatory and respiratory systems: Secondary | ICD-10-CM | POA: Diagnosis not present

## 2018-03-03 DIAGNOSIS — G5762 Lesion of plantar nerve, left lower limb: Secondary | ICD-10-CM

## 2018-03-03 MED ORDER — HYDROCODONE-ACETAMINOPHEN 7.5-325 MG PO TABS: ORAL_TABLET | ORAL | 0 refills | Status: DC

## 2018-03-05 NOTE — Progress Notes (Signed)
He presents today states the left fourth toe is really been giving me a fit recently.  He says nothing seems to help as the medication.  He states that he does not want any further surgery and does not think that he can heal from it.  Objective: Vital signs are stable he is alert and oriented x3.  Pulses are nonpalpable neurologic sensorium hyper sensate palpable Mulder's click third interdigital space of the left foot.  Assessment: Neuritis obvious regrowth of stump neuroma third interdigital space of the left foot.  Painful fourth toe.  Plan: Discussed etiology pathology conservative versus surgical therapies at this point time I injected dehydrated alcohol to the area after sterile Betadine skin prep a total of 2 cc was utilized and 4% dehydrated alcohol and local anesthetic.  He tolerated procedure well.  I also wrote him prescription for 30 Vicodin and I will follow-up with him in 3-4 weeks to make sure that he is doing better.

## 2018-03-24 ENCOUNTER — Encounter: Payer: Self-pay | Admitting: Podiatry

## 2018-03-24 ENCOUNTER — Ambulatory Visit (INDEPENDENT_AMBULATORY_CARE_PROVIDER_SITE_OTHER): Payer: Medicare Other | Admitting: Podiatry

## 2018-03-24 DIAGNOSIS — Q828 Other specified congenital malformations of skin: Secondary | ICD-10-CM

## 2018-03-24 DIAGNOSIS — G5762 Lesion of plantar nerve, left lower limb: Secondary | ICD-10-CM

## 2018-03-24 MED ORDER — HYDROCODONE-ACETAMINOPHEN 7.5-325 MG PO TABS: ORAL_TABLET | ORAL | 0 refills | Status: DC

## 2018-03-24 NOTE — Progress Notes (Signed)
He presents today states that the pain seems to be getting worse rather than better as she refers to the pain to the fourth toe of the left foot.  He also points to the third interdigital space.  Objective: Vital signs are stable alert and oriented x3.  Pulses are palpable.  Peripheral vascular disease is present.  Neurologic sensorium is intact.  He has pain on palpation to the proximal end of the present scar where a stump neuroma is most likely resulting in his symptoms.  Porokeratotic lesion sub-fifth met left.  Assessment: Stump neuroma third interdigital space left.  Peripheral vascular disease.  Reactive hyperkeratosis.  Plan: Ejected today with Kenalog and local anesthetic.  Also debrided an area of reactive hyperkeratosis sub-fifth metatarsal left foot.

## 2018-03-30 ENCOUNTER — Telehealth: Payer: Self-pay | Admitting: *Deleted

## 2018-03-30 NOTE — Telephone Encounter (Signed)
Left message on home phone informing pt that Dr. Milinda Pointer would like him to follow up with Dr. Fletcher Anon for the circulation, at [place], and to call me if he would like me to make his appt. Left message on mobile phone informing pt Dr. Milinda Pointer would like him to follow up with Dr. Fletcher Anon for the circulation, at [place], and to call me [phone] if he would like me to make his appt.

## 2018-04-11 MED ORDER — HYDROCODONE-ACETAMINOPHEN 7.5-325 MG PO TABS: ORAL_TABLET | ORAL | 0 refills | Status: DC

## 2018-04-11 NOTE — Addendum Note (Signed)
Addended by: Harriett Sine D on: 04/11/2018 02:17 PM   Modules accepted: Orders

## 2018-04-11 NOTE — Telephone Encounter (Addendum)
Pt states he would like the pain medication.  I told pt I would have the prescription Norco ready for pick up at the [facility] front desk.

## 2018-04-11 NOTE — Telephone Encounter (Signed)
Patient wants to discuss about the pain medication he is taking. He wants to refill it. If you can call him back on [phone]

## 2018-04-11 NOTE — Telephone Encounter (Signed)
Left message for pt to call concerning pain medication.

## 2018-04-14 ENCOUNTER — Encounter: Payer: Self-pay | Admitting: Nurse Practitioner

## 2018-04-15 ENCOUNTER — Emergency Department (HOSPITAL_COMMUNITY): Payer: Medicare Other

## 2018-04-15 ENCOUNTER — Emergency Department (HOSPITAL_COMMUNITY)
Admission: EM | Admit: 2018-04-15 | Discharge: 2018-04-15 | Disposition: A | Discharge status: Home / Self Care | Payer: Medicare Other | Attending: Emergency Medicine | Admitting: Emergency Medicine

## 2018-04-15 DIAGNOSIS — F17210 Nicotine dependence, cigarettes, uncomplicated: Secondary | ICD-10-CM | POA: Insufficient documentation

## 2018-04-15 DIAGNOSIS — N50811 Right testicular pain: Secondary | ICD-10-CM | POA: Diagnosis not present

## 2018-04-15 DIAGNOSIS — J449 Chronic obstructive pulmonary disease, unspecified: Secondary | ICD-10-CM | POA: Insufficient documentation

## 2018-04-15 DIAGNOSIS — Z79899 Other long term (current) drug therapy: Secondary | ICD-10-CM | POA: Insufficient documentation

## 2018-04-15 DIAGNOSIS — N390 Urinary tract infection, site not specified: Secondary | ICD-10-CM | POA: Insufficient documentation

## 2018-04-15 DIAGNOSIS — Z8546 Personal history of malignant neoplasm of prostate: Secondary | ICD-10-CM | POA: Insufficient documentation

## 2018-04-15 DIAGNOSIS — N50819 Testicular pain, unspecified: Secondary | ICD-10-CM

## 2018-04-15 LAB — URINALYSIS, ROUTINE W REFLEX MICROSCOPIC
BILIRUBIN URINE: NEGATIVE
GLUCOSE, UA: NEGATIVE mg/dL
HGB URINE DIPSTICK: NEGATIVE
Ketones, ur: NEGATIVE mg/dL
NITRITE: NEGATIVE
Protein, ur: NEGATIVE mg/dL
SPECIFIC GRAVITY, URINE: 1.013 (ref 1.005–1.030)
pH: 5 (ref 5.0–8.0)

## 2018-04-15 MED ORDER — LEVOFLOXACIN 500 MG PO TABS: 500.0000 mg | ORAL_TABLET | Freq: Every day | ORAL | 0 refills | Status: DC

## 2018-04-15 MED ORDER — HYDROCODONE-ACETAMINOPHEN 5-325 MG PO TABS: 1.0000 | ORAL_TABLET | Freq: Four times a day (QID) | ORAL | 0 refills | Status: DC | PRN

## 2018-04-15 MED ORDER — TRAMADOL HCL 50 MG PO TABS: 50.0000 mg | ORAL_TABLET | Freq: Four times a day (QID) | ORAL | Status: DC | PRN

## 2018-04-15 MED ORDER — HYDROCODONE-ACETAMINOPHEN 7.5-325 MG PO TABS: ORAL_TABLET | ORAL | 0 refills | Status: DC

## 2018-04-15 MED ORDER — TRAMADOL HCL 50 MG PO TABS: 50.0000 mg | ORAL_TABLET | Freq: Four times a day (QID) | ORAL | 0 refills | Status: DC | PRN

## 2018-04-15 MED ORDER — LEVOFLOXACIN 500 MG PO TABS
500.0000 mg | ORAL_TABLET | Freq: Once | ORAL | Status: AC
  Administered 2018-04-15: 500 mg via ORAL
  Filled 2018-04-15: qty 1

## 2018-04-15 NOTE — ED Notes (Signed)
ED Provider at bedside. 

## 2018-04-15 NOTE — ED Notes (Signed)
Pt aware that urine sample is needed. Pt has a urinal at bedside.

## 2018-04-15 NOTE — ED Notes (Signed)
Bed: WA17 Expected date:  Expected time:  Means of arrival:  Comments: 77 yo testicular pain

## 2018-04-15 NOTE — ED Notes (Signed)
Pt urine culture sent with urine sample.

## 2018-04-15 NOTE — ED Notes (Signed)
Ultrasound at bedside

## 2018-04-15 NOTE — ED Provider Notes (Signed)
[facility] DEPT Provider Note   CSN: [id number] Arrival date & time: 04/15/18  1037     History   Chief Complaint Chief Complaint  Patient presents with  . Groin Pain    HPI Russell [name] is a 77 y.o. male.  HPI Patient presents with 2 to 3 weeks of right-sided testicular pain.  States the pain is worse with movement.  Denies swelling, dysuria, hematuria, frequency or urgency.  No fever or chills.  No abdominal pain.  No nausea or vomiting. Past Medical History:  Diagnosis Date  . AKI (acute kidney injury) ([place]) 06/23/2017  . Arthritis    SHOUDLERS, LUMBAR  . BPH with obstruction/lower urinary tract symptoms   . BPH with urinary obstruction 01/15/2016  . COPD (chronic obstructive pulmonary disease) ([place])   . Coronary atherosclerosis    denied knowledge of this 10/27/16  . Depression   . Diverticulosis of colon   . Dyspnea   . ED (erectile dysfunction)   . Full dentures   . GERD (gastroesophageal reflux disease)    not current  . Glaucoma   . History of gastritis   . History of scabies    2008  . 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 History of syncope    03-22-2014  DX  VAGAL RESPONSE  . Hyperlipidemia, mixed   . Hypertension   . Mitral regurgitation   . Peripheral arterial disease ([place])    one vessel runoff bilaterally via peroneal arteries  . Prostate cancer ([place])   . Seizures ([place]) 03/2014   ? or syncope- patient refused to be admitted for further studies- "felt better"  . Type 2 diabetes, diet controlled ([place])    patient and his wife said no- have not been told 01/21/17  . Wears glasses     Patient Active Problem List   Diagnosis Date Noted  . Chest pain 06/23/2017  . AKI (acute kidney injury) ([place]) 06/23/2017  . Acute respiratory insufficiency   . Surgery, elective   . Radiculopathy 01/27/2017  . Cervical radiculopathy 01/27/2017  . Peripheral arterial disease ([place]) 01/23/2016  . Fever  02/22/2015  . Epididymitis, left 02/22/2015  . Cellulitis 02/22/2015  . Orchitis 02/22/2015  . Testicular abscess 02/22/2015  . DM type 2 (diabetes mellitus, type 2) ([place]) 02/22/2015  . Microcytic anemia 02/22/2015  . Epididymitis 02/22/2015  . UTI (lower urinary tract infection) 02/22/2015  . Hypotension 03/22/2014  . Neuroma digital nerve 09/28/2013  . 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 Dehydration 07/03/2010  . NAUSEA AND VOMITING 07/03/2010  . BENIGN PROSTATIC HYPERTROPHY, WITH URINARY OBSTRUCTION 06/13/2010  . ARM PAIN 06/03/2010  . DYSPNEA ON EXERTION 05/15/2010  . TOBACCO ABUSE 04/03/2010  . COPD 04/03/2010  . G E R D 04/03/2010  . OTHER DYSPHAGIA 04/03/2010  . PNEUMONIA, RIGHT LOWER LOBE 02/19/2010  . DIABETES MELLITUS, TYPE II, CONTROLLED 01/14/2010  . LUMBAR RADICULOPATHY, LEFT 05/16/2009  . DYSPHAGIA UNSPECIFIED 05/16/2009  . COUGH DUE TO ACE INHIBITORS 04/02/2009  . DIZZINESS 07/27/2008  . HYPERCHOLESTEROLEMIA, MIXED 05/11/2008  . CATARACTS, BILATERAL 03/01/2008  . MILD SPINAL STENOSIS, CERVICAL 01/12/2008  . LEG CRAMPS 01/12/2008  . EARLY SATIETY 11/04/2007  . DIVERTICULOSIS, COLON 08/31/2007  . SCABIES 08/26/2007  . ERECTILE DYSFUNCTION 08/18/2007  . ABUSE, ALCOHOL SUSPECTED 08/18/2007  . Essential hypertension 08/17/2007  . HEMOCCULT POSITIVE STOOL 07/05/2007  . ANEMIA, IRON DEFICIENCY NOS 05/31/2007  .  WEIGHT LOSS, ABNORMAL 05/10/2006    Past Surgical History:  Procedure Laterality Date  . ANTERIOR CERVICAL DECOMP/DISCECTOMY FUSION  10-16-2009   C4 -- C6  . ANTERIOR CERVICAL DECOMP/DISCECTOMY FUSION N/A 01/27/2017   Procedure: ANTERIOR CERVICAL DECOMPRESSION FUSION CERVICAL 3-4 WITH INSTRUMENTATION AND ALLOGRAFT;  Surgeon: Phylliss Bob, MD;  Location: [facility];  Service: Orthopedics;  Laterality: N/A;  ANTERIOR CERVICAL DECOMPRESSION FUSION CERVICAL 3-4 WITH INSTRUMENTATION AND ALLOGRAFT  . CAPSULOTOMY Right 01/26/2013    Procedure: MINOR CAPSULOTOMY;  Surgeon: Myrtha Mantis., MD;  Location: [facility];  Service: Ophthalmology;  Laterality: Right;  . CARPECTOMY Right 03/13/2014   Procedure: RIGHT  PROXIMAL ROW CARPECTOMY;  Surgeon: Tennis Must, MD;  Location: [facility];  Service: Orthopedics;  Laterality: Right;  . CARPECTOMY Left 10/22/2015   Procedure: LEFT WRIST PROXIMAL ROW CARPECTOMY ;  Surgeon: Leanora Cover, MD;  Location: [facility];  Service: Orthopedics;  Laterality: Left;  . CARPOMETACARPAL ([place]) FUSION OF THUMB Right 03/13/2014   Procedure: RIGHT FUSION OF THUMB CARPOMETACARPAL [facility]) JOINT;  Surgeon: Tennis Must, MD;  Location: [facility];  Service: Orthopedics;  Laterality: Right;  . CATARACT EXTRACTION W/ INTRAOCULAR LENS  IMPLANT, BILATERAL  2009  . COLONOSCOPY  08-31-2007  . COLONOSCOPY    . CRYOABLATION N/A 01/14/2015   Procedure: CRYO ABLATION PROSTATE;  Surgeon: Ailene Rud, MD;  Location: [facility];  Service: Urology;  Laterality: N/A;  . ESOPHAGOGASTRODUODENOSCOPY  last one 09-11-2008  . EXCISION MASS LEFT FACE , SUBORBITAL AREA, PLASTER RECONSTRUCTION  02-09-2011  . EXCISIONAL DEBRIDEMENT AND REPAIR RIGHT QUADRICEP TENDON  07-05-2000  . FOOT SURGERY Left   . I&D EXTREMITY Right 05/24/2013   Procedure: RIGHT INDEX WOUND DEBRIDEMENT AND CLOSURE;  Surgeon: Jolyn Nap, MD;  Location: [facility];  Service: Orthopedics;  Laterality: Right;  . INSERTION OF SUPRAPUBIC CATHETER N/A 01/14/2015   Procedure: SUPRAPUBIC TUBE PLACEMENT;  Surgeon: Ailene Rud, MD;  Location: [facility];  Service: Urology;  Laterality: N/A;  . KNEE ARTHROSCOPY Right 2008  . LARYNGOSCOPY AND ESOPHAGOSCOPY  06-13-2010   MARSUPIALIZATION LEFT LARGE VALLECULAR CYST  . MASS EXCISION Left 10/22/2015   Procedure: EXCISION MASS OF LEFT INDEX FINGER;  Surgeon: Leanora Cover, MD;  Location: [facility];   Service: Orthopedics;  Laterality: Left;  . ORCHIECTOMY Left 02/24/2015   Procedure: SCROTAL EXPLORATION WITH LEFT ORCHIECTOMY;  Surgeon: Kathie Rhodes, MD;  Location: [facility];  Service: Urology;  Laterality: Left;  . POSTERIOR CERVICAL FUSION/FORAMINOTOMY N/A 01/28/2017   Procedure: POSTERIOR SPINAL FUSION CERVICAL 3-4, CERVICAL 4-5, CERVICAL 5-6, CERVICAL 6-7 WITH INSTRUMENATION AND ALLOGRAFT;  Surgeon: Phylliss Bob, MD;  Location: [facility];  Service: Orthopedics;  Laterality: N/A;  POSTERIOR SPINAL FUSION CERVICAL 3-4, CERVICAL 4-5, CERVICAL 5-6, CERVICAL 6-7 WITH INSTRUMENATION AND ALLOGRAFT  . SHOULDER ARTHROSCOPY/ DEBRIDEMENT LABRAL TEAR/  BICEPS TENOTOMY Left 09-24-2011  . TONSILLECTOMY  as child  . TRANSTHORACIC ECHOCARDIOGRAM  02-21-2010   normal LVF/  ef 55-60%/ mild to moderate MR/  moderate LAE/  mild TR  . YAG LASER APPLICATION Right 03/21/9621   Procedure: YAG LASER APPLICATION;  Surgeon: Myrtha Mantis., MD;  Location: [facility];  Service: Ophthalmology;  Laterality: Right;  . YAG LASER CAPSULOTOMY, LEFT EYE  01-08-2011        Home Medications    Prior to Admission medications   Medication Sig Start Date End Date Taking? Authorizing Provider  AZOPT  1 % ophthalmic suspension Place 1 drop into the left eye 3 (three) times daily.  12/25/16  Yes [provider]  bimatoprost (LUMIGAN) 0.01 % SOLN Place 1 drop into both eyes at bedtime. 04/10/13  Yes Dhungel, Nishant, MD  brimonidine-timolol (COMBIGAN) 0.2-0.5 % ophthalmic solution Place 1 drop into both eyes 2 (two) times daily.    Yes [provider]  gabapentin (NEURONTIN) 300 MG capsule Take 300 mg by mouth 2 (two) times daily.  10/19/16  Yes [provider]  losartan (COZAAR) 100 MG tablet Take 100 mg by mouth daily. 06/08/17  Yes [provider]  meclizine (ANTIVERT) 25 MG tablet Take 25 mg by mouth daily. 06/16/17  Yes [provider]  Multiple Vitamins-Minerals (CENTRUM SILVER ADULT 50+ PO)  Take 1 tablet by mouth daily.   Yes [provider]  oxybutynin (DITROPAN) 5 MG tablet TAKE 1 TABLET EVERY 12 HOURS   Yes [provider]  pravastatin (PRAVACHOL) 80 MG tablet Take 80 mg by mouth daily.  10/19/16  Yes [provider]  tamsulosin (FLOMAX) 0.4 MG CAPS capsule Take 0.4 mg by mouth at bedtime.  12/03/16  Yes [provider]  brimonidine (ALPHAGAN) 0.2 % ophthalmic solution Place 1 drop into both eyes 2 (two) times daily. Patient not taking: Reported on 04/15/2018 06/25/17   Aline August, MD  esomeprazole (NEXIUM) 40 MG capsule Take 1 capsule (40 mg total) by mouth daily. Patient not taking: Reported on 04/15/2018 07/16/17   Isaiah Serge, NP  HYDROcodone-acetaminophen [facility]) 7.5-325 MG tablet Take one tablet every 8 hours prn foot pain. Patient not taking: Reported on 04/15/2018 03/24/18   Garrel Ridgel, DPM  HYDROcodone-acetaminophen (NORCO) 7.5-325 MG tablet Take one tablet every 8 hours prn foot pain. Patient not taking: Reported on 04/15/2018 04/11/18   Hyatt, Max T, DPM  levofloxacin (LEVAQUIN) 500 MG tablet Take 1 tablet (500 mg total) by mouth daily. 04/15/18   Julianne Rice, MD  traMADol (ULTRAM) 50 MG tablet Take 1 tablet (50 mg total) by mouth every 6 (six) hours as needed. 04/15/18   Julianne Rice, MD    Family History Family History  Problem Relation Age of Onset  . Unexplained death Mother        died at a young age, no known cause  . Heart attack Father        age unknown  . Diabetes Unknown   . Cancer Unknown     Social History Social History   Tobacco Use  . Smoking status: Current Every Day Smoker    Packs/day: 1.00    Years: 59.00    Pack years: 59.00    Types: Cigarettes  . Smokeless tobacco: Never Used  . Tobacco comment: Started Chantix 10/26/16  Substance Use Topics  . Alcohol use: No    Comment: none since approx 2014- heavy before  . Drug use: No     Allergies   Patient has no known  allergies.   Review of Systems Review of Systems  Constitutional: Negative for chills and fever.  Gastrointestinal: Negative for abdominal pain and nausea.  Genitourinary: Positive for testicular pain. Negative for difficulty urinating, discharge, dysuria, flank pain, frequency, hematuria, penile pain and scrotal swelling.  Musculoskeletal: Negative for back pain and neck pain.  Skin: Negative for rash and wound.  Neurological: Negative for dizziness, weakness, numbness and headaches.  All other systems reviewed and are negative.    Physical Exam Updated Vital Signs BP 114/67   Pulse Marland Kitchen)  59   Temp 98.2 F (36.8 C) (Oral)   Resp 16   SpO2 99%   Physical Exam  Constitutional: He is oriented to person, place, and time. He appears well-developed and well-nourished.  HENT:  Head: Normocephalic and atraumatic.  Eyes: Pupils are equal, round, and reactive to light. EOM are normal.  Neck: Normal range of motion. Neck supple.  Cardiovascular: Normal rate.  Pulmonary/Chest: Effort normal.  Abdominal: Soft. Bowel sounds are normal. There is no tenderness. There is no rebound and no guarding.  Genitourinary: Penis normal.  Genitourinary Comments: Uncircumcised penis.  No discharge.  No obvious scrotal swelling or rashes.  Patient does have right-sided testicular tenderness to palpation.  Unable to elicit cremasteric reflex.  Musculoskeletal: Normal range of motion. He exhibits no edema or tenderness.  Neurological: He is alert and oriented to person, place, and time.  Moves all extremities without focal deficit.  Sensation intact.  Skin: Skin is warm and dry. Capillary refill takes less than 2 seconds. No rash noted. No erythema.  Psychiatric: He has a normal mood and affect. His behavior is normal.  Nursing note and vitals reviewed.    ED Treatments / Results  Labs (all labs ordered are listed, but only abnormal results are displayed) Labs Reviewed  URINALYSIS, ROUTINE W REFLEX  MICROSCOPIC - Abnormal; Notable for the following components:      Result Value   Leukocytes, UA MODERATE (*)    Bacteria, UA RARE (*)    All other components within normal limits  URINE CULTURE  GC/CHLAMYDIA PROBE AMP ([place]) NOT AT [facility]    EKG None  Radiology US Scrotum W/doppler  Result Date: 04/15/2018 CLINICAL DATA:  Right-sided testicular pain for 3 weeks. Prior left orchiectomy. EXAM: SCROTAL ULTRASOUND DOPPLER ULTRASOUND OF THE TESTICLES TECHNIQUE: Complete ultrasound examination of the testicles, epididymis, and other scrotal structures was performed. Color and spectral Doppler ultrasound were also utilized to evaluate blood flow to the testicles. COMPARISON:  06/27/2017 FINDINGS: Right testicle Measurements: 3.2 x 1.4 x 2.0 cm. A few microcalcifications are noted. A small amount of non masslike hypoechogenicity in the testicle may reflect mild tubular ectasia of the rete testis. Left testicle Surgically absent. Right epididymis: Mildly heterogeneous echotexture without increased vascularity or focal mass. Left epididymis:  Surgically absent. Hydrocele:  None visualized. Varicocele:  None visualized. Pulsed Doppler interrogation of both testes demonstrates normal low resistance arterial and venous waveforms bilaterally. IMPRESSION: 1. No evidence of right-sided testicular torsion or other acute abnormality. 2. Prior left orchidectomy. Electronically Signed   By: Logan Bores M.D.   On: 04/15/2018 12:35    Procedures Procedures (including critical care time)  Medications Ordered in ED Medications  levofloxacin (LEVAQUIN) tablet 500 mg (has no administration in time range)     Initial Impression / Assessment and Plan / ED Course  I have reviewed the triage vital signs and the nursing notes.  Pertinent labs & imaging results that were available during my care of the patient were reviewed by me and considered in my medical decision making (see chart for details).    No  acute abnormality on scrotal ultrasound.  Patient does have evidence of urinary tract infection.  Urine sent for culture.  Will start on Levaquin and have advised follow-up with his urologist.  Return precautions given.   Final Clinical Impressions(s) / ED Diagnoses   Final diagnoses:  Testicular pain, right  Acute lower UTI    ED Discharge Orders  Ordered    levofloxacin (LEVAQUIN) 500 MG tablet  Daily     04/15/18 1516    traMADol (ULTRAM) 50 MG tablet  Every 6 hours PRN     04/15/18 1516       Julianne Rice, MD 04/15/18 1516

## 2018-04-15 NOTE — Discharge Instructions (Signed)
Will make an appointment to follow-up with your urologist.

## 2018-04-15 NOTE — ED Triage Notes (Signed)
Transported by [facility] from home--right testicular pain/groin pain x "a few weeks" that has gotten worse over the last 2 days. Denies any n/v, urinary symptoms or swelling. Pain 10/10.

## 2018-04-16 ENCOUNTER — Other Ambulatory Visit: Payer: Self-pay

## 2018-04-16 ENCOUNTER — Encounter (HOSPITAL_COMMUNITY): Payer: Self-pay | Admitting: Obstetrics and Gynecology

## 2018-04-16 ENCOUNTER — Emergency Department (HOSPITAL_COMMUNITY)
Admission: EM | Admit: 2018-04-16 | Discharge: 2018-04-16 | Disposition: A | Discharge status: Home / Self Care | Payer: Medicare Other | Attending: Emergency Medicine | Admitting: Emergency Medicine

## 2018-04-16 DIAGNOSIS — F17210 Nicotine dependence, cigarettes, uncomplicated: Secondary | ICD-10-CM | POA: Insufficient documentation

## 2018-04-16 DIAGNOSIS — E860 Dehydration: Secondary | ICD-10-CM

## 2018-04-16 DIAGNOSIS — R112 Nausea with vomiting, unspecified: Secondary | ICD-10-CM | POA: Insufficient documentation

## 2018-04-16 DIAGNOSIS — I10 Essential (primary) hypertension: Secondary | ICD-10-CM | POA: Diagnosis not present

## 2018-04-16 DIAGNOSIS — E119 Type 2 diabetes mellitus without complications: Secondary | ICD-10-CM | POA: Diagnosis not present

## 2018-04-16 DIAGNOSIS — Z79899 Other long term (current) drug therapy: Secondary | ICD-10-CM | POA: Diagnosis not present

## 2018-04-16 DIAGNOSIS — J449 Chronic obstructive pulmonary disease, unspecified: Secondary | ICD-10-CM | POA: Insufficient documentation

## 2018-04-16 DIAGNOSIS — I70209 Unspecified atherosclerosis of native arteries of extremities, unspecified extremity: Secondary | ICD-10-CM | POA: Diagnosis not present

## 2018-04-16 DIAGNOSIS — N390 Urinary tract infection, site not specified: Secondary | ICD-10-CM | POA: Insufficient documentation

## 2018-04-16 DIAGNOSIS — I959 Hypotension, unspecified: Secondary | ICD-10-CM | POA: Diagnosis present

## 2018-04-16 LAB — URINE CULTURE

## 2018-04-16 LAB — CBC WITH DIFFERENTIAL/PLATELET
BASOS ABS: 0.1 10*3/uL (ref 0.0–0.1)
BASOS PCT: 1 %
Eosinophils Absolute: 0.1 10*3/uL (ref 0.0–0.7)
Eosinophils Relative: 1 %
HCT: 37.2 % — ABNORMAL LOW (ref 39.0–52.0)
Hemoglobin: 11.7 g/dL — ABNORMAL LOW (ref 13.0–17.0)
LYMPHS PCT: 22 %
Lymphs Abs: 2.2 10*3/uL (ref 0.7–4.0)
MCH: 24 pg — ABNORMAL LOW (ref 26.0–34.0)
MCHC: 31.5 g/dL (ref 30.0–36.0)
MCV: 76.2 fL — AB (ref 78.0–100.0)
MONO ABS: 0.7 10*3/uL (ref 0.1–1.0)
Monocytes Relative: 8 %
Neutro Abs: 6.6 10*3/uL (ref 1.7–7.7)
Neutrophils Relative %: 68 %
Platelets: 298 10*3/uL (ref 150–400)
RBC: 4.88 MIL/uL (ref 4.22–5.81)
RDW: 19 % — AB (ref 11.5–15.5)
WBC: 9.7 10*3/uL (ref 4.0–10.5)

## 2018-04-16 LAB — COMPREHENSIVE METABOLIC PANEL
ALBUMIN: 3.9 g/dL (ref 3.5–5.0)
ALT: 19 U/L (ref 17–63)
AST: 15 U/L (ref 15–41)
Alkaline Phosphatase: 55 U/L (ref 38–126)
Anion gap: 13 (ref 5–15)
BUN: 24 mg/dL — AB (ref 6–20)
CHLORIDE: 106 mmol/L (ref 101–111)
CO2: 22 mmol/L (ref 22–32)
CREATININE: 1.19 mg/dL (ref 0.61–1.24)
Calcium: 9.6 mg/dL (ref 8.9–10.3)
GFR calc Af Amer: >60 mL/min (ref >60)
GFR calc non Af Amer: 57 mL/min — ABNORMAL LOW (ref >60)
GLUCOSE: 109 mg/dL — AB (ref 65–99)
POTASSIUM: 4.2 mmol/L (ref 3.5–5.1)
Sodium: 141 mmol/L (ref 135–145)
Total Bilirubin: 0.2 mg/dL — ABNORMAL LOW (ref 0.3–1.2)
Total Protein: 7.1 g/dL (ref 6.5–8.1)

## 2018-04-16 LAB — URINALYSIS, ROUTINE W REFLEX MICROSCOPIC
BACTERIA UA: NONE SEEN
BILIRUBIN URINE: NEGATIVE
Glucose, UA: NEGATIVE mg/dL
Hgb urine dipstick: NEGATIVE
KETONES UR: NEGATIVE mg/dL
Nitrite: NEGATIVE
PROTEIN: NEGATIVE mg/dL
Specific Gravity, Urine: 1.015 (ref 1.005–1.030)
pH: 5 (ref 5.0–8.0)

## 2018-04-16 LAB — LIPASE, BLOOD: Lipase: 22 U/L (ref 11–51)

## 2018-04-16 MED ORDER — SODIUM CHLORIDE 0.9 % IV BOLUS
1000.0000 mL | Freq: Once | INTRAVENOUS | Status: AC
  Administered 2018-04-16: 1000 mL via INTRAVENOUS

## 2018-04-16 MED ORDER — PROMETHAZINE HCL 25 MG/ML IJ SOLN
12.5000 mg | Freq: Once | INTRAMUSCULAR | Status: AC
  Administered 2018-04-16: 12.5 mg via INTRAVENOUS
  Filled 2018-04-16: qty 1

## 2018-04-16 MED ORDER — ONDANSETRON HCL 4 MG/2ML IJ SOLN
4.0000 mg | Freq: Once | INTRAMUSCULAR | Status: AC
  Administered 2018-04-16: 4 mg via INTRAVENOUS
  Filled 2018-04-16: qty 2

## 2018-04-16 NOTE — ED Triage Notes (Signed)
Pt BIB EMS from home--per EMS pt started having emesis starting this morning, increased dizziness with standing today, reports normally low HR, orthostatic with EMS. Denies pain, shortness of breath, chest pain with EMS. Nausea- 4mg  zofran given en route with EMS 20 ga R AC. Recent UTI unsure of tx for per EMS. 100/60 HR 54 RR 18 100% RA CBG 120

## 2018-04-16 NOTE — Discharge Instructions (Addendum)
You may return if your symptoms worsen or for any other concerns

## 2018-04-16 NOTE — ED Notes (Signed)
Bed: WA04 Expected date:  Expected time:  Means of arrival:  Comments: EMS 

## 2018-04-16 NOTE — ED Provider Notes (Signed)
[facility] DEPT Provider Note   CSN: [id number] Arrival date & time: 04/16/18  1339     History   Chief Complaint Chief Complaint  Patient presents with  . Hypotension    HPI Russell [name] is a 77 y.o. male.  Pt presents to the ED today with n/v.  The pt was here yesterday and was diagnosed with UTI and started on levaquin.  Urine was sent for culture.  Results are not yet done.  The pt denies any pain.  He said the vomiting started 1 hour pta.  EMS said pt was orthostatic for them.     Past Medical History:  Diagnosis Date  . AKI (acute kidney injury) ([place]) 06/23/2017  . Arthritis    SHOUDLERS, LUMBAR  . BPH with obstruction/lower urinary tract symptoms   . BPH with urinary obstruction 01/15/2016  . COPD (chronic obstructive pulmonary disease) ([place])   . Coronary atherosclerosis    denied knowledge of this 10/27/16  . Depression   . Diverticulosis of colon   . Dyspnea   . ED (erectile dysfunction)   . Full dentures   . GERD (gastroesophageal reflux disease)    not current  . Glaucoma   . History of gastritis   . History of scabies    2008  . 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 History of syncope    03-22-2014  DX  VAGAL RESPONSE  . Hyperlipidemia, mixed   . Hypertension   . Mitral regurgitation   . Peripheral arterial disease ([place])    one vessel runoff bilaterally via peroneal arteries  . Prostate cancer ([place])   . Seizures ([place]) 03/2014   ? or syncope- patient refused to be admitted for further studies- "felt better"  . Type 2 diabetes, diet controlled ([place])    patient and his wife said no- have not been told 01/21/17  . Wears glasses     Patient Active Problem List   Diagnosis Date Noted  . Chest pain 06/23/2017  . AKI (acute kidney injury) ([place]) 06/23/2017  . Acute respiratory insufficiency   . Surgery, elective   . Radiculopathy 01/27/2017  . Cervical radiculopathy 01/27/2017  .  Peripheral arterial disease ([place]) 01/23/2016  . Fever 02/22/2015  . Epididymitis, left 02/22/2015  . Cellulitis 02/22/2015  . Orchitis 02/22/2015  . Testicular abscess 02/22/2015  . DM type 2 (diabetes mellitus, type 2) ([place]) 02/22/2015  . Microcytic anemia 02/22/2015  . Epididymitis 02/22/2015  . UTI (lower urinary tract infection) 02/22/2015  . Hypotension 03/22/2014  . Neuroma digital nerve 09/28/2013  . 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 Dehydration 07/03/2010  . NAUSEA AND VOMITING 07/03/2010  . BENIGN PROSTATIC HYPERTROPHY, WITH URINARY OBSTRUCTION 06/13/2010  . ARM PAIN 06/03/2010  . DYSPNEA ON EXERTION 05/15/2010  . TOBACCO ABUSE 04/03/2010  . COPD 04/03/2010  . G E R D 04/03/2010  . OTHER DYSPHAGIA 04/03/2010  . PNEUMONIA, RIGHT LOWER LOBE 02/19/2010  . DIABETES MELLITUS, TYPE II, CONTROLLED 01/14/2010  . LUMBAR RADICULOPATHY, LEFT 05/16/2009  . DYSPHAGIA UNSPECIFIED 05/16/2009  . COUGH DUE TO ACE INHIBITORS 04/02/2009  . DIZZINESS 07/27/2008  . HYPERCHOLESTEROLEMIA, MIXED 05/11/2008  . CATARACTS, BILATERAL 03/01/2008  . MILD SPINAL STENOSIS, CERVICAL 01/12/2008  . LEG CRAMPS 01/12/2008  . EARLY SATIETY 11/04/2007  . DIVERTICULOSIS, COLON 08/31/2007  . SCABIES 08/26/2007  . ERECTILE DYSFUNCTION 08/18/2007  . ABUSE, ALCOHOL SUSPECTED 08/18/2007  .  Essential hypertension 08/17/2007  . HEMOCCULT POSITIVE STOOL 07/05/2007  . ANEMIA, IRON DEFICIENCY NOS 05/31/2007  . WEIGHT LOSS, ABNORMAL 05/10/2006    Past Surgical History:  Procedure Laterality Date  . ANTERIOR CERVICAL DECOMP/DISCECTOMY FUSION  10-16-2009   C4 -- C6  . ANTERIOR CERVICAL DECOMP/DISCECTOMY FUSION N/A 01/27/2017   Procedure: ANTERIOR CERVICAL DECOMPRESSION FUSION CERVICAL 3-4 WITH INSTRUMENTATION AND ALLOGRAFT;  Surgeon: Phylliss Bob, MD;  Location: [facility];  Service: Orthopedics;  Laterality: N/A;  ANTERIOR CERVICAL DECOMPRESSION FUSION CERVICAL 3-4 WITH INSTRUMENTATION  AND ALLOGRAFT  . CAPSULOTOMY Right 01/26/2013   Procedure: MINOR CAPSULOTOMY;  Surgeon: Myrtha Mantis., MD;  Location: [facility];  Service: Ophthalmology;  Laterality: Right;  . CARPECTOMY Right 03/13/2014   Procedure: RIGHT  PROXIMAL ROW CARPECTOMY;  Surgeon: Tennis Must, MD;  Location: [facility];  Service: Orthopedics;  Laterality: Right;  . CARPECTOMY Left 10/22/2015   Procedure: LEFT WRIST PROXIMAL ROW CARPECTOMY ;  Surgeon: Leanora Cover, MD;  Location: [facility];  Service: Orthopedics;  Laterality: Left;  . CARPOMETACARPAL ([place]) FUSION OF THUMB Right 03/13/2014   Procedure: RIGHT FUSION OF THUMB CARPOMETACARPAL [facility]) JOINT;  Surgeon: Tennis Must, MD;  Location: [facility];  Service: Orthopedics;  Laterality: Right;  . CATARACT EXTRACTION W/ INTRAOCULAR LENS  IMPLANT, BILATERAL  2009  . COLONOSCOPY  08-31-2007  . COLONOSCOPY    . CRYOABLATION N/A 01/14/2015   Procedure: CRYO ABLATION PROSTATE;  Surgeon: Ailene Rud, MD;  [facility];  Service: Urology;  Laterality: N/A;  . ESOPHAGOGASTRODUODENOSCOPY  last one 09-11-2008  . EXCISION MASS LEFT FACE , SUBORBITAL AREA, PLASTER RECONSTRUCTION  02-09-2011  . EXCISIONAL DEBRIDEMENT AND REPAIR RIGHT QUADRICEP TENDON  07-05-2000  . FOOT SURGERY Left   . I&D EXTREMITY Right 05/24/2013   Procedure: RIGHT INDEX WOUND DEBRIDEMENT AND CLOSURE;  Surgeon: Jolyn Nap, MD;  Location: [facility];  Service: Orthopedics;  Laterality: Right;  . INSERTION OF SUPRAPUBIC CATHETER N/A 01/14/2015   Procedure: SUPRAPUBIC TUBE PLACEMENT;  Surgeon: Ailene Rud, MD;  Location[facility];  Service: Urology;  Laterality: N/A;  . KNEE ARTHROSCOPY Right 2008  . LARYNGOSCOPY AND ESOPHAGOSCOPY  06-13-2010   MARSUPIALIZATION LEFT LARGE VALLECULAR CYST  . MASS EXCISION Left 10/22/2015   Procedure: EXCISION MASS OF LEFT INDEX FINGER;  Surgeon: Leanora Cover, MD;  Location: [facility];  Service: Orthopedics;  Laterality: Left;  . ORCHIECTOMY Left 02/24/2015   Procedure: SCROTAL EXPLORATION WITH LEFT ORCHIECTOMY;  Surgeon: Kathie Rhodes, MD;  Location: [facility];  Service: Urology;  Laterality: Left;  . POSTERIOR CERVICAL FUSION/FORAMINOTOMY N/A 01/28/2017   Procedure: POSTERIOR SPINAL FUSION CERVICAL 3-4, CERVICAL 4-5, CERVICAL 5-6, CERVICAL 6-7 WITH INSTRUMENATION AND ALLOGRAFT;  Surgeon: Phylliss Bob, MD;  Location: [facility];  Service: Orthopedics;  Laterality: N/A;  POSTERIOR SPINAL FUSION CERVICAL 3-4, CERVICAL 4-5, CERVICAL 5-6, CERVICAL 6-7 WITH INSTRUMENATION AND ALLOGRAFT  . SHOULDER ARTHROSCOPY/ DEBRIDEMENT LABRAL TEAR/  BICEPS TENOTOMY Left 09-24-2011  . TONSILLECTOMY  as child  . TRANSTHORACIC ECHOCARDIOGRAM  02-21-2010   normal LVF/  ef 55-60%/ mild to moderate MR/  moderate LAE/  mild TR  . YAG LASER APPLICATION Right 01/27/7411   Procedure: YAG LASER APPLICATION;  Surgeon: Myrtha Mantis., MD;  Location: [facility];  Service: Ophthalmology;  Laterality: Right;  . YAG LASER CAPSULOTOMY, LEFT EYE  01-08-2011        Home Medications  Prior to Admission medications   Medication Sig Start Date End Date Taking? Authorizing Provider  AZOPT 1 % ophthalmic suspension Place 1 drop into the left eye 3 (three) times daily.  12/25/16   [provider]  bimatoprost (LUMIGAN) 0.01 % SOLN Place 1 drop into both eyes at bedtime. 04/10/13   Dhungel, Nishant, MD  brimonidine (ALPHAGAN) 0.2 % ophthalmic solution Place 1 drop into both eyes 2 (two) times daily. Patient not taking: Reported on 04/15/2018 06/25/17   Aline August, MD  brimonidine-timolol (COMBIGAN) 0.2-0.5 % ophthalmic solution Place 1 drop into both eyes 2 (two) times daily.     [provider]  esomeprazole (NEXIUM) 40 MG capsule Take 1 capsule (40 mg total) by mouth daily. Patient not taking: Reported on 04/15/2018 07/16/17   Isaiah Serge, NP  gabapentin  (NEURONTIN) 300 MG capsule Take 300 mg by mouth 2 (two) times daily.  10/19/16   [provider]  HYDROcodone-acetaminophen (NORCO) 5-325 MG tablet Take 1 tablet by mouth every 6 (six) hours as needed. 04/15/18   Julianne Rice, MD  levofloxacin (LEVAQUIN) 500 MG tablet Take 1 tablet (500 mg total) by mouth daily. 04/16/18   Julianne Rice, MD  losartan (COZAAR) 100 MG tablet Take 100 mg by mouth daily. 06/08/17   [provider]  meclizine (ANTIVERT) 25 MG tablet Take 25 mg by mouth daily. 06/16/17   [provider]  Multiple Vitamins-Minerals (CENTRUM SILVER ADULT 50+ PO) Take 1 tablet by mouth daily.    [provider]  oxybutynin (DITROPAN) 5 MG tablet TAKE 1 TABLET EVERY 12 HOURS    [provider]  pravastatin (PRAVACHOL) 80 MG tablet Take 80 mg by mouth daily.  10/19/16   [provider]  tamsulosin (FLOMAX) 0.4 MG CAPS capsule Take 0.4 mg by mouth at bedtime.  12/03/16   [provider]    Family History Family History  Problem Relation Age of Onset  . Unexplained death Mother        died at a young age, no known cause  . Heart attack Father        age unknown  . Diabetes Unknown   . Cancer Unknown     Social History Social History   Tobacco Use  . Smoking status: Current Every Day Smoker    Packs/day: 1.00    Years: 59.00    Pack years: 59.00    Types: Cigarettes  . Smokeless tobacco: Never Used  . Tobacco comment: Started Chantix 10/26/16  Substance Use Topics  . Alcohol use: No    Comment: none since approx 2014- heavy before  . Drug use: No     Allergies   Patient has no known allergies.   Review of Systems Review of Systems  Gastrointestinal: Positive for nausea and vomiting.  All other systems reviewed and are negative.    Physical Exam Updated Vital Signs BP (!) 155/132 (BP Location: Left Arm)   Pulse 68   Temp 97.8 F (36.6 C)   Resp 19   Ht 6\' 2"  (1.88 m)   Wt 71.7 kg (158 lb)    SpO2 91%   BMI 20.29 kg/m   Physical Exam  Constitutional: He is oriented to person, place, and time. He appears well-developed and well-nourished.  HENT:  Head: Normocephalic and atraumatic.  Right Ear: External ear normal.  Left Ear: External ear normal.  Nose: Nose normal.  Mouth/Throat: Oropharynx is clear and moist.  Eyes: Pupils are equal, round, and  reactive to light. Conjunctivae and EOM are normal.  Neck: Normal range of motion. Neck supple.  Cardiovascular: Normal rate, regular rhythm, normal heart sounds and intact distal pulses.  Pulmonary/Chest: Effort normal and breath sounds normal.  Abdominal: Soft. Bowel sounds are normal.  Musculoskeletal: Normal range of motion.  Neurological: He is alert and oriented to person, place, and time.  Skin: Skin is warm. Capillary refill takes less than 2 seconds.  Psychiatric: He has a normal mood and affect. His behavior is normal. Judgment and thought content normal.  Nursing note and vitals reviewed.    ED Treatments / Results  Labs (all labs ordered are listed, but only abnormal results are displayed) Labs Reviewed  CBC WITH DIFFERENTIAL/PLATELET - Abnormal; Notable for the following components:      Result Value   Hemoglobin 11.7 (*)    HCT 37.2 (*)    MCV 76.2 (*)    MCH 24.0 (*)    RDW 19.0 (*)    All other components within normal limits  COMPREHENSIVE METABOLIC PANEL - Abnormal; Notable for the following components:   Glucose, Bld 109 (*)    BUN 24 (*)    Total Bilirubin 0.2 (*)    GFR calc non Af Amer 57 (*)    All other components within normal limits  LIPASE, BLOOD  URINALYSIS, ROUTINE W REFLEX MICROSCOPIC    EKG EKG Interpretation  Date/Time:  Saturday April 16 2018 13:52:56 EDT Ventricular Rate:  49 PR Interval:    QRS Duration: 128 QT Interval:  479 QTC Calculation: 433 R Axis:   40 Text Interpretation:  Sinus bradycardia Probable left atrial enlargement Nonspecific intraventricular conduction  delay Minimal ST elevation, anterior leads Confirmed by Isla Pence [phone]) on 04/16/2018 1:57:16 PM   Radiology US Scrotum W/doppler  Result Date: 04/15/2018 CLINICAL DATA:  Right-sided testicular pain for 3 weeks. Prior left orchiectomy. EXAM: SCROTAL ULTRASOUND DOPPLER ULTRASOUND OF THE TESTICLES TECHNIQUE: Complete ultrasound examination of the testicles, epididymis, and other scrotal structures was performed. Color and spectral Doppler ultrasound were also utilized to evaluate blood flow to the testicles. COMPARISON:  06/27/2017 FINDINGS: Right testicle Measurements: 3.2 x 1.4 x 2.0 cm. A few microcalcifications are noted. A small amount of non masslike hypoechogenicity in the testicle may reflect mild tubular ectasia of the rete testis. Left testicle Surgically absent. Right epididymis: Mildly heterogeneous echotexture without increased vascularity or focal mass. Left epididymis:  Surgically absent. Hydrocele:  None visualized. Varicocele:  None visualized. Pulsed Doppler interrogation of both testes demonstrates normal low resistance arterial and venous waveforms bilaterally. IMPRESSION: 1. No evidence of right-sided testicular torsion or other acute abnormality. 2. Prior left orchidectomy. Electronically Signed   By: Logan Bores M.D.   On: 04/15/2018 12:35    Procedures Procedures (including critical care time)  Medications Ordered in ED Medications  sodium chloride 0.9 % bolus 1,000 mL (1,000 mLs Intravenous New Bag/Given 04/16/18 1607)  sodium chloride 0.9 % bolus 1,000 mL (0 mLs Intravenous Stopped 04/16/18 1605)  promethazine (PHENERGAN) injection 12.5 mg (12.5 mg Intravenous Given 04/16/18 1436)  ondansetron (ZOFRAN) injection 4 mg (4 mg Intravenous Given 04/16/18 1607)     Initial Impression / Assessment and Plan / ED Course  I have reviewed the triage vital signs and the nursing notes.  Pertinent labs & imaging results that were available during my care of the patient were  reviewed by me and considered in my medical decision making (see chart for details).    Pt is still  nauseous and has not yet given a urine sample.  Pt signed out to Dr. Sabra Heck pending final results.  Final Clinical Impressions(s) / ED Diagnoses   Final diagnoses:  Non-intractable vomiting with nausea, unspecified vomiting type  Dehydration    ED Discharge Orders    None       Isla Pence, MD 04/16/18 1626

## 2018-04-16 NOTE — ED Provider Notes (Signed)
The patient is refusing to stay for the urinalysis results.  He has been tolerating fluids without any difficulty, he is taking his own IV out and leaving the emergency department.  He is ambulating with a stable gait   Noemi Chapel, MD 04/16/18 [phone]

## 2018-04-16 NOTE — ED Notes (Signed)
When NT went in to check on patient, pt told NT "Take this IV out or i'll take it out myself". RN told MD and also told MD that Urine was in process.  Pt requested to be ddischarged.  Pt currently tolerating PO fluids and has not had any emesis during this ED visit.

## 2018-04-16 NOTE — ED Notes (Signed)
Pt assisted out to lobby with warm blanket wrapped around shoulders and another cup of water.

## 2018-04-18 LAB — GC/CHLAMYDIA PROBE AMP (~~LOC~~) NOT AT ARMC
Chlamydia: NEGATIVE
Neisseria Gonorrhea: NEGATIVE

## 2018-04-26 ENCOUNTER — Encounter: Payer: Self-pay | Admitting: Podiatry

## 2018-04-26 ENCOUNTER — Ambulatory Visit (INDEPENDENT_AMBULATORY_CARE_PROVIDER_SITE_OTHER): Payer: Medicare Other | Admitting: Podiatry

## 2018-04-26 ENCOUNTER — Other Ambulatory Visit: Payer: Self-pay | Admitting: Podiatry

## 2018-04-26 ENCOUNTER — Ambulatory Visit (INDEPENDENT_AMBULATORY_CARE_PROVIDER_SITE_OTHER): Payer: Medicare Other

## 2018-04-26 DIAGNOSIS — G5762 Lesion of plantar nerve, left lower limb: Secondary | ICD-10-CM

## 2018-04-26 DIAGNOSIS — G5782 Other specified mononeuropathies of left lower limb: Secondary | ICD-10-CM

## 2018-04-26 DIAGNOSIS — G8929 Other chronic pain: Secondary | ICD-10-CM

## 2018-04-26 DIAGNOSIS — M79675 Pain in left toe(s): Principal | ICD-10-CM

## 2018-04-26 MED ORDER — HYDROCODONE-ACETAMINOPHEN 5-325 MG PO TABS: 1.0000 | ORAL_TABLET | Freq: Four times a day (QID) | ORAL | 0 refills | Status: DC | PRN

## 2018-04-26 NOTE — Progress Notes (Signed)
He presents today complaining of neuroma to the third interdigital space of the left foot.  He states that it still hurts he was doing good for a few days but I think there is something still stuck in there is painful.  Objective: Vital signs are stable alert and oriented x3.  Pulses are palpable only to the level of the midfoot.  His toes are cool to the touch.  He has pain to palpation third interdigital space of the left foot proximal end of the previous incision where neuroma was removed.  This leads [place] to believe there is a stump neuroma there.  Assessment: Neuroma third interdigital space left.  Plan: At this point I wrote another 5-day prescription for pain medication.  I also injected after sterile Betadine skin prep 2 mL of 4% dehydrated alcohol with local anesthetic.  He tolerated this well I will follow-up with him in 3 to 4 weeks.

## 2018-05-02 ENCOUNTER — Ambulatory Visit: Payer: Medicare HMO | Admitting: Nurse Practitioner

## 2018-05-02 NOTE — Progress Notes (Deleted)
CARDIOLOGY OFFICE NOTE  Date:  05/02/2018    Russell [name] Date of Birth: [birth year] Medical Record #[id number]  PCP:  Rogers Blocker, MD  Cardiologist:  Servando Snare & ***    No chief complaint on file.   History of Present Illness: Russell [name] is a 77 y.o. male who presents today for a *** a hx of tob use, HTN, HLD, COPD, GERD, depression, glaucoma, ?DM2, bi-leaflet MV prolapse w/ mild-mod MR on echo 2011, EF normal. PAD w/ L-ABI 0.51 01/2016 s/p evaluation by Dr Gwenlyn Found w/ no surgery on L foot due to high risk of lack of healing, and bradycardia,  Major complaint is exertional dyspnea. Dyspnea resolves with rest. He denies associated chest discomfort/tightness/fullness. He denies lower extremity swelling. No prior history of congestive heart failure. When he lies down the breathing improves. He denies PND.   Comes in today. Here with   Past Medical History:  Diagnosis Date  . AKI (acute kidney injury) ([place]) 06/23/2017  . Arthritis    SHOUDLERS, LUMBAR  . BPH with obstruction/lower urinary tract symptoms   . BPH with urinary obstruction 01/15/2016  . COPD (chronic obstructive pulmonary disease) ([place])   . Coronary atherosclerosis    denied knowledge of this 10/27/16  . Depression   . Diverticulosis of colon   . Dyspnea   . ED (erectile dysfunction)   . Full dentures   . GERD (gastroesophageal reflux disease)    not current  . Glaucoma   . History of gastritis   . History of scabies    2008  . 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 History of syncope    03-22-2014  DX  VAGAL RESPONSE  . Hyperlipidemia, mixed   . Hypertension   . Mitral regurgitation   . Peripheral arterial disease ([place])    one vessel runoff bilaterally via peroneal arteries  . Prostate cancer ([place])   . Seizures ([place]) 03/2014   ? or syncope- patient refused to be admitted for further studies- "felt better"  . Type 2 diabetes, diet controlled ([place])    patient and  his wife said no- have not been told 01/21/17  . Wears glasses     Past Surgical History:  Procedure Laterality Date  . ANTERIOR CERVICAL DECOMP/DISCECTOMY FUSION  10-16-2009   C4 -- C6  . ANTERIOR CERVICAL DECOMP/DISCECTOMY FUSION N/A 01/27/2017   Procedure: ANTERIOR CERVICAL DECOMPRESSION FUSION CERVICAL 3-4 WITH INSTRUMENTATION AND ALLOGRAFT;  Surgeon: Phylliss Bob, MD;  Location: [place];  Service: Orthopedics;  Laterality: N/A;  ANTERIOR CERVICAL DECOMPRESSION FUSION CERVICAL 3-4 WITH INSTRUMENTATION AND ALLOGRAFT  . CAPSULOTOMY Right 01/26/2013   Procedure: MINOR CAPSULOTOMY;  Surgeon: Myrtha Mantis., MD;  Location: [place];  Service: Ophthalmology;  Laterality: Right;  . CARPECTOMY Right 03/13/2014   Procedure: RIGHT  PROXIMAL ROW CARPECTOMY;  Surgeon: Tennis Must, MD;  Location: [place];  Service: Orthopedics;  Laterality: Right;  . CARPECTOMY Left 10/22/2015   Procedure: LEFT WRIST PROXIMAL ROW CARPECTOMY ;  Surgeon: Leanora Cover, MD;  Location: [place];  Service: Orthopedics;  Laterality: Left;  . CARPOMETACARPAL ([place]) FUSION OF THUMB Right 03/13/2014   Procedure: RIGHT FUSION OF THUMB CARPOMETACARPAL [facility]) JOINT;  Surgeon: Tennis Must, MD;  Location: [place];  Service: Orthopedics;  Laterality: Right;  . CATARACT EXTRACTION W/ INTRAOCULAR LENS  IMPLANT, BILATERAL  2009  . COLONOSCOPY  08-31-2007  . COLONOSCOPY    . CRYOABLATION N/A 01/14/2015   Procedure: CRYO ABLATION PROSTATE;  Surgeon: Ailene Rud, MD;  Location: [facility];  Service: Urology;  Laterality: N/A;  . ESOPHAGOGASTRODUODENOSCOPY  last one 09-11-2008  . EXCISION MASS LEFT FACE , SUBORBITAL AREA, PLASTER RECONSTRUCTION  02-09-2011  . EXCISIONAL DEBRIDEMENT AND REPAIR RIGHT QUADRICEP TENDON  07-05-2000  . FOOT SURGERY Left   . I&D EXTREMITY Right 05/24/2013   Procedure: RIGHT INDEX WOUND DEBRIDEMENT AND CLOSURE;  Surgeon: Jolyn Nap,  MD;  Location: [place];  Service: Orthopedics;  Laterality: Right;  . INSERTION OF SUPRAPUBIC CATHETER N/A 01/14/2015   Procedure: SUPRAPUBIC TUBE PLACEMENT;  Surgeon: Ailene Rud, MD;  Location: [facility];  Service: Urology;  Laterality: N/A;  . KNEE ARTHROSCOPY Right 2008  . LARYNGOSCOPY AND ESOPHAGOSCOPY  06-13-2010   MARSUPIALIZATION LEFT LARGE VALLECULAR CYST  . MASS EXCISION Left 10/22/2015   Procedure: EXCISION MASS OF LEFT INDEX FINGER;  Surgeon: Leanora Cover, MD;  Location: [place];  Service: Orthopedics;  Laterality: Left;  . ORCHIECTOMY Left 02/24/2015   Procedure: SCROTAL EXPLORATION WITH LEFT ORCHIECTOMY;  Surgeon: Kathie Rhodes, MD;  Location: [place];  Service: Urology;  Laterality: Left;  . POSTERIOR CERVICAL FUSION/FORAMINOTOMY N/A 01/28/2017   Procedure: POSTERIOR SPINAL FUSION CERVICAL 3-4, CERVICAL 4-5, CERVICAL 5-6, CERVICAL 6-7 WITH INSTRUMENATION AND ALLOGRAFT;  Surgeon: Phylliss Bob, MD;  Location: [place];  Service: Orthopedics;  Laterality: N/A;  POSTERIOR SPINAL FUSION CERVICAL 3-4, CERVICAL 4-5, CERVICAL 5-6, CERVICAL 6-7 WITH INSTRUMENATION AND ALLOGRAFT  . SHOULDER ARTHROSCOPY/ DEBRIDEMENT LABRAL TEAR/  BICEPS TENOTOMY Left 09-24-2011  . TONSILLECTOMY  as child  . TRANSTHORACIC ECHOCARDIOGRAM  02-21-2010   normal LVF/  ef 55-60%/ mild to moderate MR/  moderate LAE/  mild TR  . YAG LASER APPLICATION Right 06/22/5328   Procedure: YAG LASER APPLICATION;  Surgeon: Myrtha Mantis., MD;  Location: [place];  Service: Ophthalmology;  Laterality: Right;  . YAG LASER CAPSULOTOMY, LEFT EYE  01-08-2011     Medications: No outpatient medications have been marked as taking for the 05/02/18 encounter (Appointment) with Burtis Junes, NP.     Allergies: No Known Allergies  Social History: The patient  reports that he has been smoking cigarettes.  He has a 59.00 pack-year smoking history. He has never used smokeless  tobacco. He reports that he does not drink alcohol or use drugs.   Family History: The patient's ***family history includes Cancer in his unknown relative; Diabetes in his unknown relative; Heart attack in his father; Unexplained death in his mother.   Review of Systems: Please see the history of present illness.   Otherwise, the review of systems is positive for {NONE DEFAULTED:18576::"none"}.   All other systems are reviewed and negative.   Physical Exam: VS:  There were no vitals taken for this visit. Marland Kitchen  BMI There is no height or weight on file to calculate BMI.  Wt Readings from Last 3 Encounters:  04/16/18 158 lb (71.7 kg)  09/14/17 160 lb (72.6 kg)  08/20/17 160 lb 6.4 oz (72.8 kg)    General: Pleasant. Well developed, well nourished and in no acute distress.   HEENT: Normal.  Neck: Supple, no JVD, carotid bruits, or masses noted.  Cardiac: ***Regular rate and rhythm. No murmurs, rubs, or gallops. No edema.  Respiratory:  Lungs are clear to auscultation bilaterally with normal work of breathing.  GI: Soft and  nontender.  MS: No deformity or atrophy. Gait and ROM intact.  Skin: Warm and dry. Color is normal.  Neuro:  Strength and sensation are intact and no gross focal deficits noted.  Psych: Alert, appropriate and with normal affect.   LABORATORY DATA:  EKG:  EKG {ACTION; IS/IS JIR:67893810} ordered today. This demonstrates ***.  Lab Results  Component Value Date   WBC 9.7 04/16/2018   HGB 11.7 (L) 04/16/2018   HCT 37.2 (L) 04/16/2018   PLT 298 04/16/2018   GLUCOSE 109 (H) 04/16/2018   CHOL 143 06/24/2017   TRIG 264 (H) 06/24/2017   HDL 35 (L) 06/24/2017   LDLCALC 55 06/24/2017   ALT 19 04/16/2018   AST 15 04/16/2018   NA 141 04/16/2018   K 4.2 04/16/2018   CL 106 04/16/2018   CREATININE 1.19 04/16/2018   BUN 24 (H) 04/16/2018   CO2 22 04/16/2018   TSH 2.141 07/04/2010   PSA 6.47 (H) 01/14/2015   INR 1.00 01/21/2017   HGBA1C 5.9 (H) 02/24/2015    MICROALBUR 0.57 01/06/2011     BNP (last 3 results) No results for input(s): BNP in the last 8760 hours.  ProBNP (last 3 results) No results for input(s): PROBNP in the last 8760 hours.   Other Studies Reviewed Today:   Assessment/Plan: 12/17/16 nuc study Study Highlights     Nuclear stress EF: 43%.  There was no ST segment deviation noted during stress.  This is an intermediate risk study.  The left ventricular ejection fraction is moderately decreased (30-44%).  1. Fixed small, mild basal inferior perfusion defect. This may be due to diaphragmatic attenuation. No ischemia.  2. EF 43% with diffuse hypokinesis.  3. Intermediate risk study due to decreased LV systolic function.     06/24/17 Echo  Study Conclusions  - Left ventricle: The cavity size was normal. Wall thickness was increased in a pattern of mild LVH. Systolic function was normal. The estimated ejection fraction was in the range of 60% to 65%. Features are consistent with a pseudonormal left ventricular filling pattern, with concomitant abnormal relaxation and increased filling pressure (grade 2 diastolic dysfunction). - Mitral valve: Valve area by pressure half-time: 2.27 cm^2.   Lower extremity vascular ultrasound 12/24/2015: 50-74% right mid SFA stenosis. 30-49% left SFA disease, without focal stenosis. Occluded bilateral anterior and posterior tibial arteries. One vessel run-off, bilaterally.   ASSESSMENT:    1. Essential hypertension   2. Peripheral arterial disease ([place])   3. Type 2 diabetes mellitus with diabetic peripheral angiopathy without gangrene, without long-term current use of insulin ([place])   4. HYPERCHOLESTEROLEMIA, MIXED   5. Stable angina pectoris ([place])   6. Dyspnea on exertion      PLAN:  In order of problems listed above:  1. The blood pressure is well controlled 2. Peripheral arterial disease as noted above. I encouraged patient to  discontinue cigarette smoking. 3. Management by Dr. Kevan Ny his primary care physician. 4. Last LDL cholesterol was 55 on 06/24/2017. Continue current therapy. 5. Low risk myocardial perfusion study within the past 12 months. Do not feel it is advisable to proceed with coronary angiography in absence of frank anginal complaints. It is possible however that dyspnea on exertion could be an anginal equivalent. If he does not have significant COPD on pulmonary function testing, perhaps angiography will be necessary. 6. Possibly/probably related to COPD. We'll perform pulmonary function testing with DLCO. Encouraged the patient to stop smoking.   Current medicines are reviewed with  the patient today.  The patient does not have concerns regarding medicines other than what has been noted above.  The following changes have been made:  See above.  Labs/ tests ordered today include:   No orders of the defined types were placed in this encounter.    Disposition:   FU with *** in {gen number 8-02:233612} {Days to years:10300}.   Patient is agreeable to this plan and will call if any problems develop in the interim.   SignedTruitt Merle, NP  05/02/2018 7:23 AM  [facility] [address] [place], [place]  [address] Phone: [phone] Fax: [phone]

## 2018-05-05 ENCOUNTER — Telehealth: Payer: Self-pay | Admitting: Podiatry

## 2018-05-05 MED ORDER — HYDROCODONE-ACETAMINOPHEN 5-325 MG PO TABS: 1.0000 | ORAL_TABLET | Freq: Four times a day (QID) | ORAL | 0 refills | Status: DC | PRN

## 2018-05-05 NOTE — Telephone Encounter (Signed)
Left message informing pt Dr. Milinda Pointer refilled the [facility] and it could be picked up in the [facility] office today before 5:00pm and tomorrow before 1:00pm.

## 2018-05-05 NOTE — Addendum Note (Signed)
Addended by: Harriett Sine D on: 05/05/2018 03:23 PM   Modules accepted: Orders

## 2018-05-05 NOTE — Telephone Encounter (Signed)
I'm calling for Dr. Milinda Pointer to get me more pain pills. He only gave me 6 pills the last time. I don't understand why they cut it down from 30 to 6 pills. You can call me back at [phone].

## 2018-05-11 ENCOUNTER — Telehealth: Payer: Self-pay | Admitting: Cardiology

## 2018-05-11 ENCOUNTER — Encounter: Payer: Self-pay | Admitting: Cardiology

## 2018-05-11 ENCOUNTER — Ambulatory Visit (INDEPENDENT_AMBULATORY_CARE_PROVIDER_SITE_OTHER): Payer: Medicare Other | Admitting: Cardiology

## 2018-05-11 DIAGNOSIS — I739 Peripheral vascular disease, unspecified: Secondary | ICD-10-CM | POA: Diagnosis not present

## 2018-05-11 DIAGNOSIS — G588 Other specified mononeuropathies: Secondary | ICD-10-CM

## 2018-05-11 DIAGNOSIS — I10 Essential (primary) hypertension: Secondary | ICD-10-CM | POA: Diagnosis not present

## 2018-05-11 DIAGNOSIS — M79672 Pain in left foot: Secondary | ICD-10-CM | POA: Diagnosis not present

## 2018-05-11 DIAGNOSIS — F17200 Nicotine dependence, unspecified, uncomplicated: Secondary | ICD-10-CM

## 2018-05-11 NOTE — Assessment & Plan Note (Signed)
See dopplers from 2017 compared with dopplers from Jan 2019 that show "no evidence of arterial disease"

## 2018-05-11 NOTE — Progress Notes (Addendum)
05/11/2018 Russell [name]   09-09-1941  [id number]  Primary Physician Russell [name] Russell Littler, MD Primary Cardiologist: Dr Russell [name]/ Dr Russell [name]  HPI:  Mr Russell [name] is a pleasant 77 y.o. AA male with a hx of tob use, HTN, HLD, COPD, GERD, and PAD. He had LEA dopplers in Jan 2017 that showed normal ABIs bilaterally with a normal right TBI and a left ABI 0.51 with a 50-74% right mid SFA stenosis, 30-49% left SFA disease without focal stenosis, and occluded bilateral anterior and posterior tibial arteries with one vessel run-off, bilaterally. He was evaluated by Dr Russell [name] Feb 2017 for possible foot surgery. Dr Russell [name] recommended no surgery be done on the pt's left foot due to high risk of lack of healing. He did not think he was a candidate for endovascular therapy given the fact that he did not have an open wound nor did he have critical limb ischemia. Since then the patient has had a Myoview Dec 2017 that was low risk and an echo July 2018 that showed normal LVF with grade 2 DD.  He is referred today for evaluation of possible Lt foot surgery. He was sent by Dr Russell [name]. The pt has a neuroma that causes him a great deal of discomfort. From a cardiac standpoint he is doing well, he continues to smoke a pack a day. He denies chest pain. There is no evidence of critical limb ischemia on exam.      Current Outpatient Medications  Medication Sig Dispense Refill  . AZOPT 1 % ophthalmic suspension Place 1 drop into the left eye 3 (three) times daily.     . bimatoprost (LUMIGAN) 0.01 % SOLN Place 1 drop into both eyes at bedtime. 30 mL 3  . brimonidine-timolol (COMBIGAN) 0.2-0.5 % ophthalmic solution Place 1 drop into both eyes 2 (two) times daily.     . clindamycin (CLEOCIN T) 1 % external solution daily.     . diclofenac sodium (VOLTAREN) 1 % GEL daily.     Marland Kitchen esomeprazole (NEXIUM) 40 MG capsule Take 1 capsule (40 mg total) by mouth daily. 30 capsule 2  . gabapentin (NEURONTIN) 300 MG capsule Take 300 mg by mouth 2  (two) times daily.   3  . HYDROcodone-acetaminophen (NORCO) 5-325 MG tablet Take 1 tablet by mouth every 6 (six) hours as needed for moderate pain. 30 tablet 0  . ketoconazole (NIZORAL) 2 % cream     . losartan (COZAAR) 100 MG tablet Take 100 mg by mouth daily.  4  . meclizine (ANTIVERT) 25 MG tablet Take 25 mg by mouth daily.    . Multiple Vitamins-Minerals (CENTRUM SILVER ADULT 50+ PO) Take 1 tablet by mouth daily.    . pravastatin (PRAVACHOL) 80 MG tablet Take 80 mg by mouth daily.   2  . tamsulosin (FLOMAX) 0.4 MG CAPS capsule Take 0.4 mg by mouth at bedtime.     . ferrous sulfate 325 (65 FE) MG tablet daily.      No current facility-administered medications for this visit.     No Known Allergies  Past Medical History:  Diagnosis Date  . AKI (acute kidney injury) ([place]) 06/23/2017  . Arthritis    SHOUDLERS, LUMBAR  . BPH with obstruction/lower urinary tract symptoms   . BPH with urinary obstruction 01/15/2016  . COPD (chronic obstructive pulmonary disease) ([place])   . Coronary atherosclerosis    denied knowledge of this 10/27/16  . Depression   . Diverticulosis of colon   . Dyspnea   .  ED (erectile dysfunction)   . Full dentures   . GERD (gastroesophageal reflux disease)    not current  . Glaucoma   . History of gastritis   . History of scabies    2008  . 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 History of syncope    03-22-2014  DX  VAGAL RESPONSE  . Hyperlipidemia, mixed   . Hypertension   . Mitral regurgitation   . Peripheral arterial disease ([place])    one vessel runoff bilaterally via peroneal arteries  . Prostate cancer ([place])   . Seizures ([place]) 03/2014   ? or syncope- patient refused to be admitted for further studies- "felt better"  . Type 2 diabetes, diet controlled ([place])    patient and his wife said no- have not been told 01/21/17  . Wears glasses     Social History   Socioeconomic History  . Marital status: Married    Spouse name:  Not on file  . Number of children: Not on file  . Years of education: Not on file  . Highest education level: Not on file  Occupational History  . Occupation: Retired - poured Equities trader  . Financial resource strain: Not on file  . Food insecurity:    Worry: Not on file    Inability: Not on file  . Transportation needs:    Medical: Not on file    Non-medical: Not on file  Tobacco Use  . Smoking status: Current Every Day Smoker    Packs/day: 1.00    Years: 59.00    Pack years: 59.00    Types: Cigarettes  . Smokeless tobacco: Never Used  . Tobacco comment: Started Chantix 10/26/16  Substance and Sexual Activity  . Alcohol use: No    Comment: none since approx 2014- heavy before  . Drug use: No  . Sexual activity: Not on file  Lifestyle  . Physical activity:    Days per week: Not on file    Minutes per session: Not on file  . Stress: Not on file  Relationships  . Social connections:    Talks on phone: Not on file    Gets together: Not on file    Attends religious service: Not on file    Active member of club or organization: Not on file    Attends meetings of clubs or organizations: Not on file    Relationship status: Not on file  . Intimate partner violence:    Fear of current or ex partner: Not on file    Emotionally abused: Not on file    Physically abused: Not on file    Forced sexual activity: Not on file  Other Topics Concern  . Not on file  Social History Narrative   Lives with wife.     Family History  Problem Relation Age of Onset  . Unexplained death Mother        died at a young age, no known cause  . Heart attack Father        age unknown  . Diabetes Unknown   . Cancer Unknown      Review of Systems: General: negative for chills, fever, night sweats or weight changes.  Cardiovascular: negative for chest pain, dyspnea on exertion, edema, orthopnea, palpitations, paroxysmal nocturnal dyspnea or shortness of breath Dermatological:  negative for rash Respiratory: negative for cough or wheezing Urologic: negative for hematuria Abdominal: negative for nausea, vomiting,  diarrhea, bright red blood per rectum, melena, or hematemesis Neurologic: negative for visual changes, syncope, or dizziness All other systems reviewed and are otherwise negative except as noted above.    Blood pressure 106/68, pulse 70, height 6\' 2"  (1.88 m), weight 150 lb 9.6 oz (68.3 kg).  General appearance: alert, cooperative, no distress and thin AA male, NAD Neck: no carotid bruit and no JVD Lungs: decreased breath sounds c/w COPD Heart: regular rate and rhythm Extremities: no edema. warm to touch, no obvious open wounds Pulses: diminnished LEA pulses bilateraly Skin: Skin color, texture, turgor normal. 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  Essential hypertension Controlled- grade 2 DD by echo July 2018   PLAN  I will discuss with Dr Russell [name] and further recommendations to follow.   Kerin Ransom PA-C 05/11/2018 1:11 PM   ADDENDUM: I spoke with Dr Russell [name]- he feels the patient can have his foot surgery with a low PV surgical risk.   Kerin Ransom PA-C 05/11/2018 4:08 PM

## 2018-05-11 NOTE — Assessment & Plan Note (Signed)
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

## 2018-05-11 NOTE — Assessment & Plan Note (Signed)
Controlled- grade 2 DD by echo July 2018

## 2018-05-11 NOTE — Telephone Encounter (Signed)
I left message- OK to have foot surgery per Dr Gwenlyn Found.  Kerin Ransom PA-C 05/11/2018 4:09 PM

## 2018-05-11 NOTE — Assessment & Plan Note (Signed)
COPD- 1PPD smoker

## 2018-05-11 NOTE — Patient Instructions (Signed)
Medication Instructions:  Your physician recommends that you continue on your current medications as directed. Please refer to the Current Medication list given to you today.  Labwork: none  Testing/Procedures: none  Follow-Up: [facility] WILL DISCUSS WITH DR BERRY AND WILL CALL YOU WITH PLAN

## 2018-05-11 NOTE — Assessment & Plan Note (Signed)
Left, 3ids.

## 2018-05-11 NOTE — Telephone Encounter (Signed)
Lorretta Harp, MD sent to Erlene Quan, PA-C        His ABIs and TBI's are normal. Based on this, he can have his neuromas removed surgically at low peripheral vascular risk.

## 2018-05-11 NOTE — Telephone Encounter (Signed)
error 

## 2018-05-17 ENCOUNTER — Ambulatory Visit: Payer: Medicare Other | Admitting: Podiatry

## 2018-05-18 ENCOUNTER — Telehealth: Payer: Self-pay | Admitting: Podiatry

## 2018-05-18 MED ORDER — HYDROCODONE-ACETAMINOPHEN 5-325 MG PO TABS: 1.0000 | ORAL_TABLET | Freq: Four times a day (QID) | ORAL | 0 refills | Status: DC | PRN

## 2018-05-18 NOTE — Telephone Encounter (Signed)
I'm calling about pain pills. My number is [phone].

## 2018-05-18 NOTE — Telephone Encounter (Signed)
That will be fine. 

## 2018-05-18 NOTE — Telephone Encounter (Signed)
Left message with Russell [name] - pt's wife, he could pick up the rx in the [facility] office tomorrow.

## 2018-05-18 NOTE — Addendum Note (Signed)
Addended by: Harriett Sine D on: 05/18/2018 05:20 PM   Modules accepted: Orders

## 2018-05-19 ENCOUNTER — Telehealth: Payer: Self-pay | Admitting: Podiatry

## 2018-05-19 NOTE — Progress Notes (Signed)
Keep this encase we need to do surgery on him.

## 2018-05-19 NOTE — Telephone Encounter (Signed)
This is Russell [name]. I was calling to see if my medication I requested to be refilled by Dr. Milinda Pointer is ready. Thank you.

## 2018-05-19 NOTE — Telephone Encounter (Signed)
Left message informing pt I had left a message with his wife, [name] that his prescription was ready for pick up in the [place] office.

## 2018-05-31 ENCOUNTER — Encounter: Payer: Self-pay | Admitting: Podiatry

## 2018-05-31 ENCOUNTER — Other Ambulatory Visit: Payer: Self-pay

## 2018-05-31 ENCOUNTER — Ambulatory Visit (INDEPENDENT_AMBULATORY_CARE_PROVIDER_SITE_OTHER): Payer: Medicare Other | Admitting: Podiatry

## 2018-05-31 DIAGNOSIS — M1990 Unspecified osteoarthritis, unspecified site: Secondary | ICD-10-CM | POA: Insufficient documentation

## 2018-05-31 DIAGNOSIS — G5762 Lesion of plantar nerve, left lower limb: Secondary | ICD-10-CM

## 2018-05-31 DIAGNOSIS — G5782 Other specified mononeuropathies of left lower limb: Secondary | ICD-10-CM

## 2018-05-31 MED ORDER — HYDROCODONE-ACETAMINOPHEN 5-325 MG PO TABS: 1.0000 | ORAL_TABLET | Freq: Four times a day (QID) | ORAL | 0 refills | Status: DC | PRN

## 2018-05-31 NOTE — Progress Notes (Signed)
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  States that he was doing very good until just the other day started to bother me once again and last night he can hardly sleep  Objective: Vital signs stable alert and oriented x3 pulses are nonpalpable but this is been confirmed by vascular surgery.  Also he has pain to palpation third interdigital space left foot proximal edge of his previous surgical scar.  Assessment: Neuroma third interspace left.  Plan: Another dose of dehydrated alcohol 2 cc 4% dehydrated alcohol sterile Betadine skin prep to this area.  He tolerated procedure well without complications also wrote another prescription for 5 days of Vicodin.  I will see him in a month

## 2018-06-10 ENCOUNTER — Telehealth: Payer: Self-pay | Admitting: Podiatry

## 2018-06-10 MED ORDER — HYDROCODONE-ACETAMINOPHEN 5-325 MG PO TABS: 1.0000 | ORAL_TABLET | Freq: Four times a day (QID) | ORAL | 0 refills | Status: DC | PRN

## 2018-06-10 NOTE — Addendum Note (Signed)
Addended by: Harriett Sine D on: 06/10/2018 10:09 AM   Modules accepted: Orders

## 2018-06-10 NOTE — Telephone Encounter (Signed)
This is Russell [name] calling Dr. Milinda Pointer for some pain medication about my toe. Telephone number [phone]. Please get in contact.

## 2018-06-10 NOTE — Telephone Encounter (Signed)
Left message with pt's wife, that the medication could be picked up in the [place] office today before 1:00pm.

## 2018-06-28 ENCOUNTER — Telehealth: Payer: Self-pay | Admitting: Podiatry

## 2018-06-28 NOTE — Telephone Encounter (Signed)
I'm calling to get pain medication from Dr. Milinda Pointer. The best call back number is [phone]. Thank you.

## 2018-06-29 MED ORDER — HYDROCODONE-ACETAMINOPHEN 5-325 MG PO TABS: 1.0000 | ORAL_TABLET | Freq: Four times a day (QID) | ORAL | 0 refills | Status: DC | PRN

## 2018-06-29 NOTE — Addendum Note (Signed)
Addended by: Harriett Sine D on: 06/29/2018 10:57 AM   Modules accepted: Orders

## 2018-06-29 NOTE — Telephone Encounter (Signed)
I informed pt the norco had been refilled and to keep his appt on 07/07/2018 at 10:45am. Pt states understanding and will pick up the rx in the [facility].

## 2018-07-05 IMAGING — CR DG CHEST 1V PORT
2 series · 2 of 2 positions shown · non-contrast
Comparison: 01/21/2017

CLINICAL DATA: ETT placement; to remain intubated post cervical
surgery

EXAM:
PORTABLE CHEST 1 VIEW

[AP (1 of 2)]
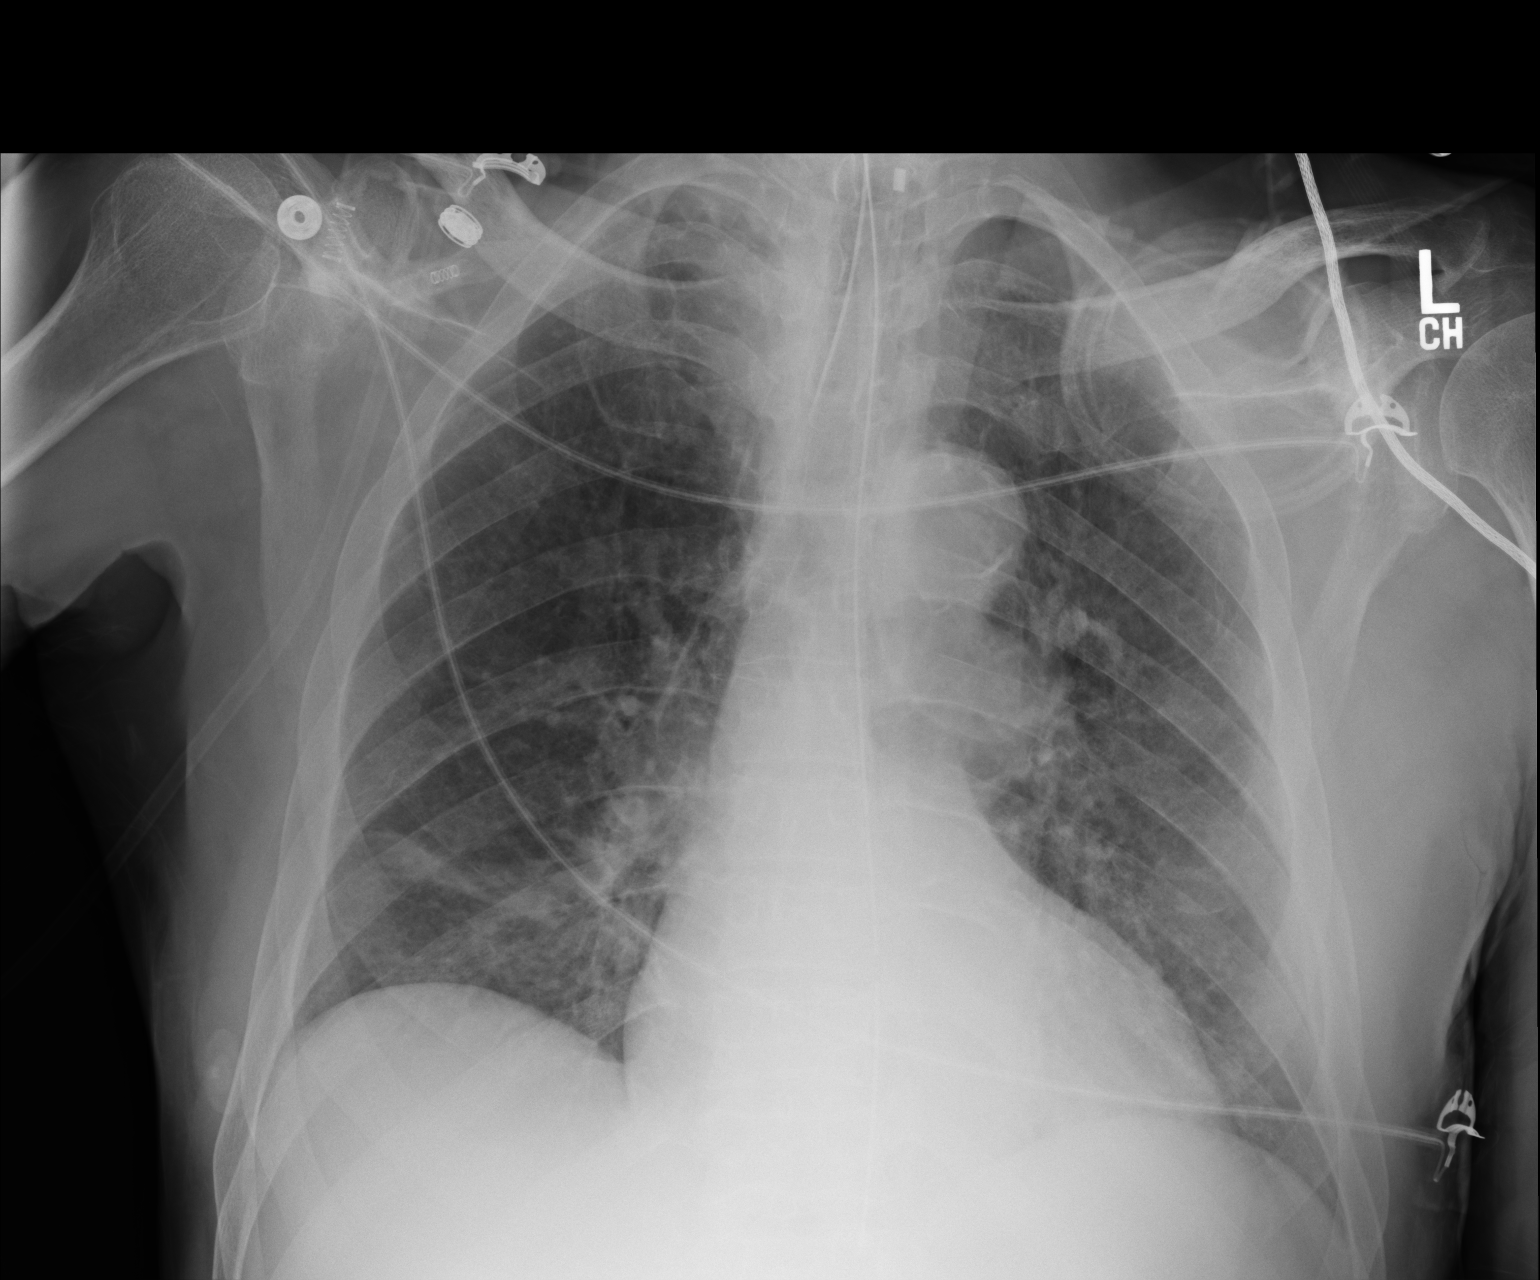

[AP (2 of 2)]
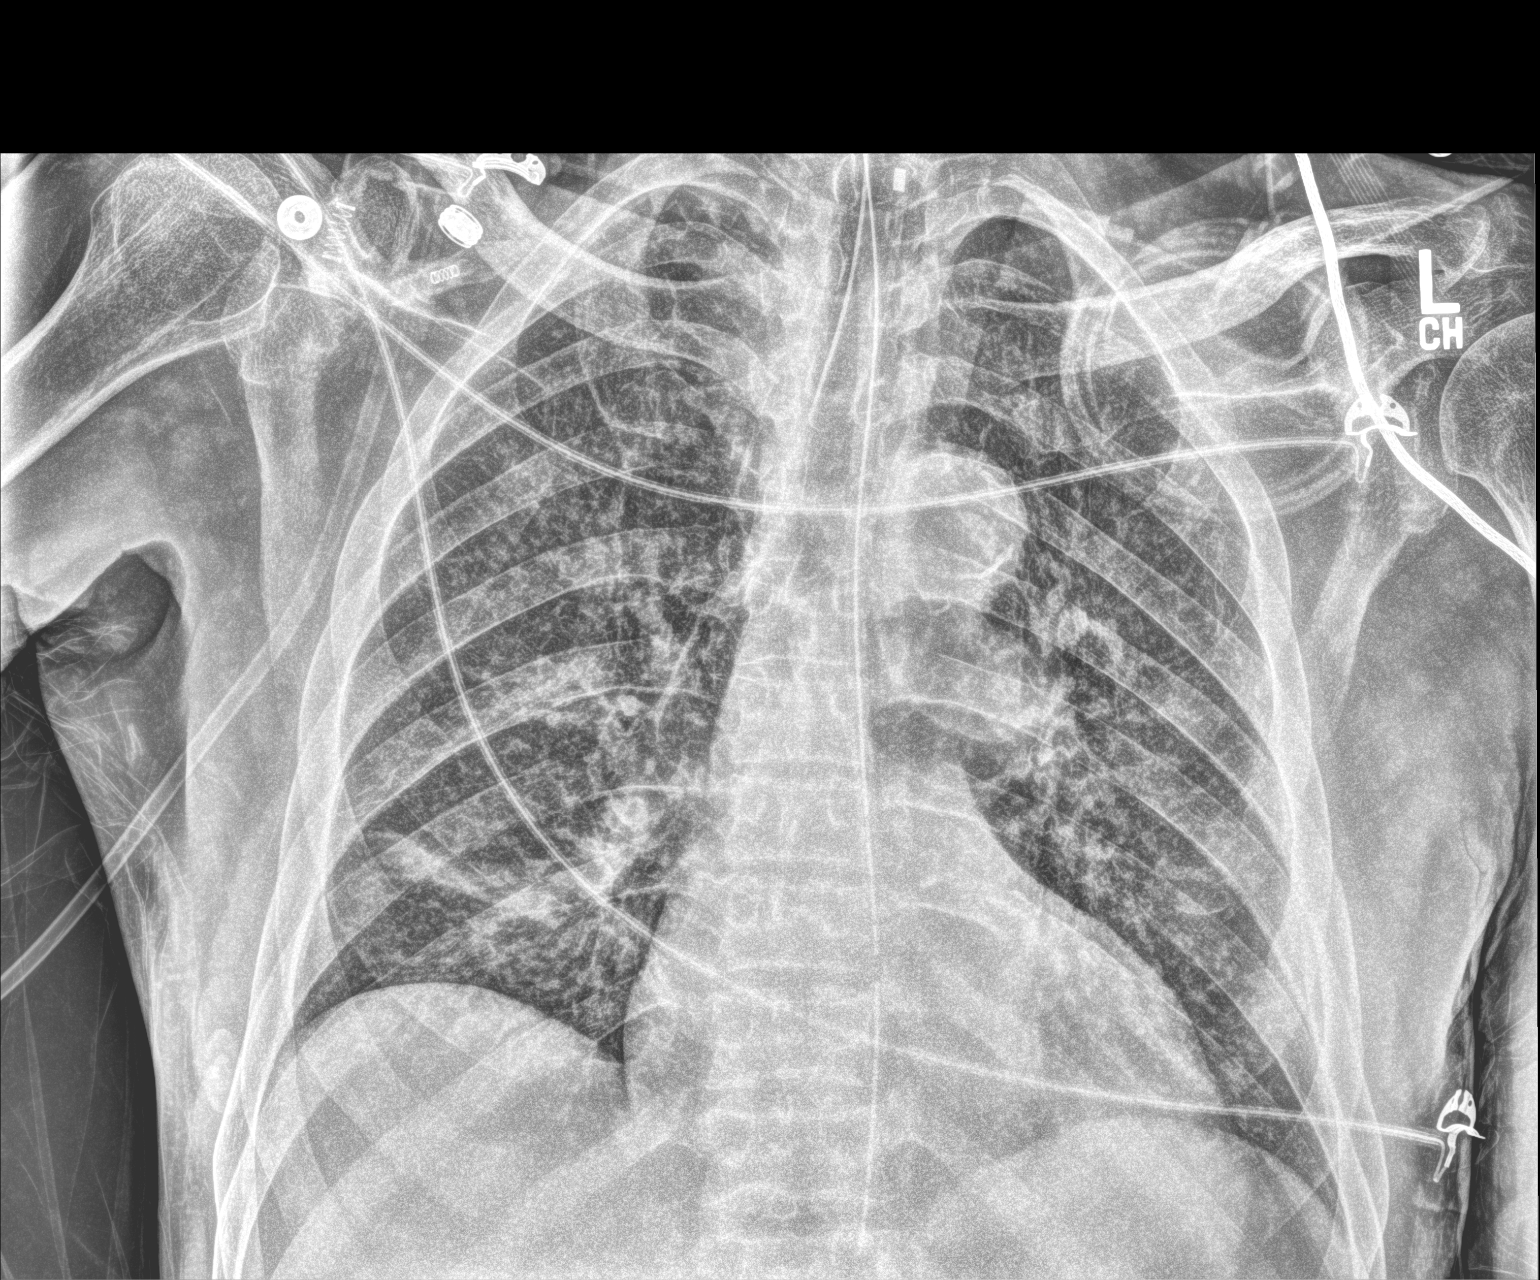

[2 of 2 positions shown; findings below may reference images not displayed]

FINDINGS: Endotracheal tube has been placed, tip approximately 4.8 cm above
the carina. A nasogastric tube is in place with tip beyond the
gastroesophageal junction and off the film. Heart size is normal.
There is minimal bibasilar atelectasis. Suspect mild interstitial
edema.
IMPRESSION: 1. Suspect mild edema and bibasilar atelectasis.
2. Endotracheal tube and nasogastric tube as described.

## 2018-07-07 ENCOUNTER — Ambulatory Visit (INDEPENDENT_AMBULATORY_CARE_PROVIDER_SITE_OTHER): Payer: Medicare Other | Admitting: Podiatry

## 2018-07-07 ENCOUNTER — Encounter: Payer: Self-pay | Admitting: Podiatry

## 2018-07-07 ENCOUNTER — Encounter (INDEPENDENT_AMBULATORY_CARE_PROVIDER_SITE_OTHER): Payer: Medicare Other | Admitting: Podiatry

## 2018-07-07 DIAGNOSIS — G5782 Other specified mononeuropathies of left lower limb: Secondary | ICD-10-CM

## 2018-07-07 DIAGNOSIS — G5762 Lesion of plantar nerve, left lower limb: Secondary | ICD-10-CM | POA: Diagnosis not present

## 2018-07-07 MED ORDER — HYDROCODONE-ACETAMINOPHEN 5-325 MG PO TABS: 1.0000 | ORAL_TABLET | Freq: Four times a day (QID) | ORAL | 0 refills | Status: DC | PRN

## 2018-07-07 NOTE — Progress Notes (Signed)
This encounter was created in error - please disregard.

## 2018-07-09 NOTE — Progress Notes (Signed)
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  States that he did fine up until last night.  Objective: Vital signs are stable alert and oriented x3.  Pulses are palpable.  At this point he has pain on palpation third interdigital space of the left foot.  Proximal to his old incision line marked.  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  Plan: Injected the area today with dehydrated alcohol after sterile Betadine skin prep 2 cc was injected 4% dehydrated alcohol point of maximal tenderness.  Wrote a prescription for Vicodin 20 tablets I will follow-up with him in 1 month

## 2018-07-28 ENCOUNTER — Ambulatory Visit (INDEPENDENT_AMBULATORY_CARE_PROVIDER_SITE_OTHER): Payer: Medicare Other | Admitting: Podiatry

## 2018-07-28 ENCOUNTER — Encounter: Payer: Self-pay | Admitting: Podiatry

## 2018-07-28 DIAGNOSIS — G5762 Lesion of plantar nerve, left lower limb: Secondary | ICD-10-CM

## 2018-07-28 DIAGNOSIS — G5782 Other specified mononeuropathies of left lower limb: Secondary | ICD-10-CM

## 2018-07-28 MED ORDER — OXYCODONE-ACETAMINOPHEN 10-325 MG PO TABS: 1.0000 | ORAL_TABLET | Freq: Four times a day (QID) | ORAL | 0 refills | Status: AC | PRN

## 2018-07-28 NOTE — Progress Notes (Signed)
He presents today for follow-up of his neuritis.  He states that is doing better until just the other day and it really started to kill me.  I need more pain medicine because is really killing me.  Objective: Vital signs stable he is alert and oriented x3 there is no erythema edema saline strange odor neuroma third interdigital space.  Assessment: Neuroma third interspace left.  Plan: Injected the area today with 2 cc of a dehydrated alcohol to the superficial skin layer not deep and metatarsals as we have in the past.  On see if this will work for him also wrote a prescription for Percocet follow-up with him in a couple of weeks at which time we will write another prescription if necessary for Vicodin only.

## 2018-08-08 ENCOUNTER — Telehealth: Payer: Self-pay | Admitting: Podiatry

## 2018-08-08 NOTE — Telephone Encounter (Signed)
I need some pain medicine. I'm a pt of Dr. Stephenie Acres. My number is [phone]. Please get in touch with me as soon as possible. Thank you.

## 2018-08-08 NOTE — Telephone Encounter (Signed)
I need some pain medicine. This left toe of mine is giving me a problem. My number is [phone]. Thank you.

## 2018-08-08 NOTE — Telephone Encounter (Signed)
I informed pt of Dr. Stephenie Acres statement 07/28/2018 office visit.

## 2018-08-08 NOTE — Telephone Encounter (Signed)
Spoke with Family Member for pt to call back to schedule appt.

## 2018-08-09 ENCOUNTER — Encounter: Payer: Self-pay | Admitting: Podiatry

## 2018-08-09 ENCOUNTER — Ambulatory Visit (INDEPENDENT_AMBULATORY_CARE_PROVIDER_SITE_OTHER): Payer: Medicare Other | Admitting: Podiatry

## 2018-08-09 DIAGNOSIS — M7752 Other enthesopathy of left foot: Secondary | ICD-10-CM | POA: Diagnosis not present

## 2018-08-09 MED ORDER — HYDROCODONE-ACETAMINOPHEN 5-325 MG PO TABS: 1.0000 | ORAL_TABLET | Freq: Four times a day (QID) | ORAL | 0 refills | Status: DC | PRN

## 2018-08-09 NOTE — Progress Notes (Signed)
He presents today chief complaint of painful fourth toe left foot.  Objective: Vital signs are stable alert and oriented x3 there is no erythema cellulitis drainage or odor he has reactive hypertrophic bone growth DIPJ area of the fourth toe.  Exquisitely tender on palpation.  Assessment: Capsulitis osteoarthritis fourth toe left.  Plan: I injected around the toe with dexamethasone today and wrote his prescription for 5 days of Vicodin and I will follow-up with him in 4 weeks.

## 2018-08-12 ENCOUNTER — Emergency Department (HOSPITAL_COMMUNITY)
Admission: EM | Admit: 2018-08-12 | Discharge: 2018-08-12 | Discharge status: Against Medical Advice | Payer: Medicare Other | Attending: Emergency Medicine | Admitting: Emergency Medicine

## 2018-08-12 ENCOUNTER — Encounter (HOSPITAL_COMMUNITY): Payer: Self-pay | Admitting: Emergency Medicine

## 2018-08-12 ENCOUNTER — Emergency Department (HOSPITAL_COMMUNITY): Payer: Medicare Other

## 2018-08-12 ENCOUNTER — Other Ambulatory Visit: Payer: Self-pay

## 2018-08-12 DIAGNOSIS — Z8546 Personal history of malignant neoplasm of prostate: Secondary | ICD-10-CM | POA: Insufficient documentation

## 2018-08-12 DIAGNOSIS — Z79899 Other long term (current) drug therapy: Secondary | ICD-10-CM | POA: Diagnosis not present

## 2018-08-12 DIAGNOSIS — I10 Essential (primary) hypertension: Secondary | ICD-10-CM | POA: Diagnosis not present

## 2018-08-12 DIAGNOSIS — J449 Chronic obstructive pulmonary disease, unspecified: Secondary | ICD-10-CM | POA: Insufficient documentation

## 2018-08-12 DIAGNOSIS — F17210 Nicotine dependence, cigarettes, uncomplicated: Secondary | ICD-10-CM | POA: Diagnosis not present

## 2018-08-12 DIAGNOSIS — E119 Type 2 diabetes mellitus without complications: Secondary | ICD-10-CM | POA: Diagnosis not present

## 2018-08-12 DIAGNOSIS — R55 Syncope and collapse: Secondary | ICD-10-CM | POA: Diagnosis not present

## 2018-08-12 LAB — COMPREHENSIVE METABOLIC PANEL
ALBUMIN: 3.7 g/dL (ref 3.5–5.0)
ALK PHOS: 59 U/L (ref 38–126)
ALT: 14 U/L (ref 0–44)
AST: 13 U/L — AB (ref 15–41)
Anion gap: 11 (ref 5–15)
BILIRUBIN TOTAL: 0.5 mg/dL (ref 0.3–1.2)
BUN: 23 mg/dL (ref 8–23)
CALCIUM: 9.3 mg/dL (ref 8.9–10.3)
CO2: 25 mmol/L (ref 22–32)
CREATININE: 1.27 mg/dL — AB (ref 0.61–1.24)
Chloride: 103 mmol/L (ref 98–111)
GFR calc Af Amer: >60 mL/min (ref >60)
GFR calc non Af Amer: 53 mL/min — ABNORMAL LOW (ref >60)
GLUCOSE: 112 mg/dL — AB (ref 70–99)
Potassium: 3.7 mmol/L (ref 3.5–5.1)
Sodium: 139 mmol/L (ref 135–145)
TOTAL PROTEIN: 6.8 g/dL (ref 6.5–8.1)

## 2018-08-12 LAB — CBC WITH DIFFERENTIAL/PLATELET
Abs Immature Granulocytes: 0.1 10*3/uL (ref 0.0–0.1)
Basophils Absolute: 0.1 10*3/uL (ref 0.0–0.1)
Basophils Relative: 1 %
Eosinophils Absolute: 0.1 10*3/uL (ref 0.0–0.7)
Eosinophils Relative: 1 %
HEMATOCRIT: 35.4 % — AB (ref 39.0–52.0)
HEMOGLOBIN: 10.6 g/dL — AB (ref 13.0–17.0)
IMMATURE GRANULOCYTES: 0 %
LYMPHS ABS: 2.7 10*3/uL (ref 0.7–4.0)
LYMPHS PCT: 20 %
MCH: 24.7 pg — ABNORMAL LOW (ref 26.0–34.0)
MCHC: 29.9 g/dL — ABNORMAL LOW (ref 30.0–36.0)
MCV: 82.3 fL (ref 78.0–100.0)
MONOS PCT: 6 %
Monocytes Absolute: 0.8 10*3/uL (ref 0.1–1.0)
NEUTROS PCT: 72 %
Neutro Abs: 9.8 10*3/uL — ABNORMAL HIGH (ref 1.7–7.7)
Platelets: 374 10*3/uL (ref 150–400)
RBC: 4.3 MIL/uL (ref 4.22–5.81)
RDW: 17.6 % — ABNORMAL HIGH (ref 11.5–15.5)
WBC: 13.6 10*3/uL — AB (ref 4.0–10.5)

## 2018-08-12 LAB — URINALYSIS, ROUTINE W REFLEX MICROSCOPIC
Bilirubin Urine: NEGATIVE
Glucose, UA: NEGATIVE mg/dL
Hgb urine dipstick: NEGATIVE
Ketones, ur: NEGATIVE mg/dL
Nitrite: POSITIVE — AB
PH: 6 (ref 5.0–8.0)
PROTEIN: NEGATIVE mg/dL
SPECIFIC GRAVITY, URINE: 1.015 (ref 1.005–1.030)
WBC, UA: >50 WBC/hpf — ABNORMAL HIGH (ref 0–5)

## 2018-08-12 LAB — I-STAT TROPONIN, ED: Troponin i, poc: 0 ng/mL (ref 0.00–0.08)

## 2018-08-12 LAB — MAGNESIUM: Magnesium: 2.3 mg/dL (ref 1.7–2.4)

## 2018-08-12 MED ORDER — SODIUM CHLORIDE 0.9 % IV BOLUS
1000.0000 mL | Freq: Once | INTRAVENOUS | Status: AC
  Administered 2018-08-12: 1000 mL via INTRAVENOUS

## 2018-08-12 NOTE — ED Notes (Addendum)
Pt has eaten 3 packs of graham crackers and drank 2 gingerales. States he feels better and wants to go home. Explained to patient we are waiting for him to give [place] a urine. He stated, "Where's that doctor at? I'm ready to go home. I'm hungry. I don't want no [place] sandwich."

## 2018-08-12 NOTE — ED Triage Notes (Signed)
Pt arrives via EMS from home where patient states was sitting on the porch outside began feeling dizzy. Near syncopal with diaphoresis, and 1 episode of emesis PTA. Pt with systolic bp in 31R. cbg 154. HR 60. Pt c/o right sided CP with coughing. Pt conscious, alert, oriented at present.

## 2018-08-12 NOTE — ED Provider Notes (Signed)
[facility] EMERGENCY DEPARTMENT Provider Note   CSN: [id number] Arrival date & time: 08/12/18  1228     History   Chief Complaint Chief Complaint  Patient presents with  . Loss of Consciousness    HPI Russell [name] is a 77 y.o. male.  The history is provided by the patient and the EMS personnel. No language interpreter was used.  Near Syncope  This is a recurrent problem. The current episode started less than 1 hour ago. The problem occurs constantly. The problem has been resolved. Pertinent negatives include no chest pain, no abdominal pain, no headaches and no shortness of breath. The symptoms are aggravated by standing. The symptoms are relieved by lying down. He has tried nothing for the symptoms. The treatment provided no relief.    Past Medical History:  Diagnosis Date  . AKI (acute kidney injury) ([place]) 06/23/2017  . Arthritis    SHOUDLERS, LUMBAR  . BPH with obstruction/lower urinary tract symptoms   . BPH with urinary obstruction 01/15/2016  . COPD (chronic obstructive pulmonary disease) ([place])   . Coronary atherosclerosis    denied knowledge of this 10/27/16  . Depression   . Diverticulosis of colon   . Dyspnea   . ED (erectile dysfunction)   . Full dentures   . GERD (gastroesophageal reflux disease)    not current  . Glaucoma   . History of gastritis   . History of scabies    2008  . 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 History of syncope    03-22-2014  DX  VAGAL RESPONSE  . Hyperlipidemia, mixed   . Hypertension   . Mitral regurgitation   . Peripheral arterial disease ([place])    one vessel runoff bilaterally via peroneal arteries  . Prostate cancer ([place])   . Seizures ([place]) 03/2014   ? or syncope- patient refused to be admitted for further studies- "felt better"  . Type 2 diabetes, diet controlled ([place])    patient and his wife said no- have not been told 01/21/17  . Wears glasses     Patient Active  Problem List   Diagnosis Date Noted  . Osteoarthritis 05/31/2018  . Foot pain, left 05/11/2018  . Chest pain 06/23/2017  . AKI (acute kidney injury) ([name]) 06/23/2017  . Acute respiratory insufficiency   . Surgery, elective   . Radiculopathy 01/27/2017  . Cervical radiculopathy 01/27/2017  . 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  . 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 Peripheral arterial disease ([place]) 01/23/2016  . SLAC (scapholunate advanced collapse) of wrist 11/13/2015  . Fever 02/22/2015  . Epididymitis, left 02/22/2015  . Cellulitis 02/22/2015  . Orchitis 02/22/2015  . Testicular abscess 02/22/2015  . DM type 2 (diabetes mellitus, type 2) ([place]) 02/22/2015  . Microcytic anemia 02/22/2015  . Epididymitis 02/22/2015  . UTI (lower urinary tract infection) 02/22/2015  . Hypotension 03/22/2014  . Neuroma digital nerve 09/28/2013  . 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 Dehydration 07/03/2010  . NAUSEA AND VOMITING 07/03/2010  . BENIGN PROSTATIC HYPERTROPHY, WITH URINARY OBSTRUCTION 06/13/2010  . ARM PAIN 06/03/2010  . DYSPNEA ON EXERTION 05/15/2010  . TOBACCO ABUSE 04/03/2010  . COPD 04/03/2010  . G E R D 04/03/2010  . OTHER DYSPHAGIA 04/03/2010  . PNEUMONIA, RIGHT LOWER LOBE 02/19/2010  . DIABETES MELLITUS, TYPE II, CONTROLLED 01/14/2010  . LUMBAR RADICULOPATHY, LEFT 05/16/2009  .  DYSPHAGIA UNSPECIFIED 05/16/2009  . COUGH DUE TO ACE INHIBITORS 04/02/2009  . DIZZINESS 07/27/2008  . HYPERCHOLESTEROLEMIA, MIXED 05/11/2008  . CATARACTS, BILATERAL 03/01/2008  . MILD SPINAL STENOSIS, CERVICAL 01/12/2008  . LEG CRAMPS 01/12/2008  . EARLY SATIETY 11/04/2007  . DIVERTICULOSIS, COLON 08/31/2007  . SCABIES 08/26/2007  . ERECTILE DYSFUNCTION 08/18/2007  . ABUSE, ALCOHOL SUSPECTED 08/18/2007  . Essential hypertension 08/17/2007  . HEMOCCULT POSITIVE STOOL 07/05/2007  . ANEMIA, IRON DEFICIENCY NOS 05/31/2007  . WEIGHT  LOSS, ABNORMAL 05/10/2006    Past Surgical History:  Procedure Laterality Date  . ANTERIOR CERVICAL DECOMP/DISCECTOMY FUSION  10-16-2009   C4 -- C6  . ANTERIOR CERVICAL DECOMP/DISCECTOMY FUSION N/A 01/27/2017   Procedure: ANTERIOR CERVICAL DECOMPRESSION FUSION CERVICAL 3-4 WITH INSTRUMENTATION AND ALLOGRAFT;  Surgeon: Phylliss Bob, MD;  Location: [facility];  Service: Orthopedics;  Laterality: N/A;  ANTERIOR CERVICAL DECOMPRESSION FUSION CERVICAL 3-4 WITH INSTRUMENTATION AND ALLOGRAFT  . CAPSULOTOMY Right 01/26/2013   Procedure: MINOR CAPSULOTOMY;  Surgeon: Myrtha Mantis., MD;  Location: [facility];  Service: Ophthalmology;  Laterality: Right;  . CARPECTOMY Right 03/13/2014   Procedure: RIGHT  PROXIMAL ROW CARPECTOMY;  Surgeon: Tennis Must, MD;  Location: [facility];  Service: Orthopedics;  Laterality: Right;  . CARPECTOMY Left 10/22/2015   Procedure: LEFT WRIST PROXIMAL ROW CARPECTOMY ;  Surgeon: Leanora Cover, MD;  Location: [facility];  Service: Orthopedics;  Laterality: Left;  . CARPOMETACARPAL ([place]) FUSION OF THUMB Right 03/13/2014   Procedure: RIGHT FUSION OF THUMB CARPOMETACARPAL [facility]) JOINT;  Surgeon: Tennis Must, MD;  Location: [facility];  Service: Orthopedics;  Laterality: Right;  . CATARACT EXTRACTION W/ INTRAOCULAR LENS  IMPLANT, BILATERAL  2009  . COLONOSCOPY  08-31-2007  . COLONOSCOPY    . CRYOABLATION N/A 01/14/2015   Procedure: CRYO ABLATION PROSTATE;  Surgeon: Ailene Rud, MD;  Location: [facility];  Service: Urology;  Laterality: N/A;  . ESOPHAGOGASTRODUODENOSCOPY  last one 09-11-2008  . EXCISION MASS LEFT FACE , SUBORBITAL AREA, PLASTER RECONSTRUCTION  02-09-2011  . EXCISIONAL DEBRIDEMENT AND REPAIR RIGHT QUADRICEP TENDON  07-05-2000  . FOOT SURGERY Left   . I&D EXTREMITY Right 05/24/2013   Procedure: RIGHT INDEX WOUND DEBRIDEMENT AND CLOSURE;  Surgeon: Jolyn Nap, MD;  Location: [facility];  Service: Orthopedics;  Laterality: Right;  . INSERTION OF SUPRAPUBIC CATHETER N/A 01/14/2015   Procedure: SUPRAPUBIC TUBE PLACEMENT;  Surgeon: Ailene Rud, MD;  Location: [facility];  Service: Urology;  Laterality: N/A;  . KNEE ARTHROSCOPY Right 2008  . LARYNGOSCOPY AND ESOPHAGOSCOPY  06-13-2010   MARSUPIALIZATION LEFT LARGE VALLECULAR CYST  . MASS EXCISION Left 10/22/2015   Procedure: EXCISION MASS OF LEFT INDEX FINGER;  Surgeon: Leanora Cover, MD;  Location: [facility];  Service: Orthopedics;  Laterality: Left;  . ORCHIECTOMY Left 02/24/2015   Procedure: SCROTAL EXPLORATION WITH LEFT ORCHIECTOMY;  Surgeon: Kathie Rhodes, MD;  Location: [facility];  Service: Urology;  Laterality: Left;  . POSTERIOR CERVICAL FUSION/FORAMINOTOMY N/A 01/28/2017   Procedure: POSTERIOR SPINAL FUSION CERVICAL 3-4, CERVICAL 4-5, CERVICAL 5-6, CERVICAL 6-7 WITH INSTRUMENATION AND ALLOGRAFT;  Surgeon: Phylliss Bob, MD;  Location: [facility];  Service: Orthopedics;  Laterality: N/A;  POSTERIOR SPINAL FUSION CERVICAL 3-4, CERVICAL 4-5, CERVICAL 5-6, CERVICAL 6-7 WITH INSTRUMENATION AND ALLOGRAFT  . SHOULDER ARTHROSCOPY/ DEBRIDEMENT LABRAL TEAR/  BICEPS TENOTOMY Left 09-24-2011  . TONSILLECTOMY  as child  . TRANSTHORACIC ECHOCARDIOGRAM  02-21-2010   normal  LVF/  ef 55-60%/ mild to moderate MR/  moderate LAE/  mild TR  . YAG LASER APPLICATION Right 05/27/8937   Procedure: YAG LASER APPLICATION;  Surgeon: Myrtha Mantis., MD;  Location: [facility];  Service: Ophthalmology;  Laterality: Right;  . YAG LASER CAPSULOTOMY, LEFT EYE  01-08-2011        Home Medications    Prior to Admission medications   Medication Sig Start Date End Date Taking? Authorizing Provider  AZOPT 1 % ophthalmic suspension Place 1 drop into the left eye 3 (three) times daily.  12/25/16   [provider]  bimatoprost (LUMIGAN) 0.01 % SOLN Place 1 drop into both eyes at bedtime. 04/10/13   Dhungel, Flonnie Overman, MD    brimonidine (ALPHAGAN) 0.2 % ophthalmic solution INT 1 GTT IN OU BID 07/22/18   [provider]  brimonidine-timolol (COMBIGAN) 0.2-0.5 % ophthalmic solution Place 1 drop into both eyes 2 (two) times daily.     [provider]  clindamycin (CLEOCIN T) 1 % external solution daily.  03/07/18   [provider]  diclofenac sodium (VOLTAREN) 1 % GEL daily.  03/07/18   [provider]  esomeprazole (NEXIUM) 40 MG capsule Take 1 capsule (40 mg total) by mouth daily. 07/16/17   Isaiah Serge, NP  ferrous sulfate 325 (65 FE) MG tablet daily.     [provider]  gabapentin (NEURONTIN) 300 MG capsule Take 300 mg by mouth 2 (two) times daily.  10/19/16   [provider]  hydrochlorothiazide (HYDRODIURIL) 12.5 MG tablet  07/28/18   [provider]  HYDROcodone-acetaminophen (NORCO/VICODIN) 5-325 MG tablet Take 1 tablet by mouth every 6 (six) hours as needed for moderate pain. 08/09/18   Hyatt, Max T, DPM  ketoconazole (NIZORAL) 2 % cream  03/07/18   [provider]  losartan (COZAAR) 100 MG tablet Take 100 mg by mouth daily. 06/08/17   [provider]  meclizine (ANTIVERT) 25 MG tablet Take 25 mg by mouth daily. 06/16/17   [provider]  Multiple Vitamins-Minerals (CENTRUM SILVER ADULT 50+ PO) Take 1 tablet by mouth daily.    [provider]  oxybutynin (DITROPAN-XL) 10 MG 24 hr tablet TK 1 T PO QD HS 04/26/18   [provider]  oxyCODONE-acetaminophen (PERCOCET) 10-325 MG tablet TK 1 T PO Q 6 H PRN FOR UP TO 5 DAYS FOR PAIN 07/28/18   [provider]  pravastatin (PRAVACHOL) 80 MG tablet Take 80 mg by mouth daily.  10/19/16   [provider]  tamsulosin (FLOMAX) 0.4 MG CAPS capsule Take 0.4 mg by mouth at bedtime.  12/03/16   [provider]    Family History Family History  Problem Relation Age of Onset  . Unexplained death Mother        died at a young age, no known cause  .  Heart attack Father        age unknown  . Diabetes Unknown   . Cancer Unknown     Social History Social History   Tobacco Use  . Smoking status: Current Every Day Smoker    Packs/day: 1.00    Years: 59.00    Pack years: 59.00    Types: Cigarettes  . Smokeless tobacco: Never Used  . Tobacco comment: Started Chantix 10/26/16  Substance Use Topics  . Alcohol use: No    Comment: none since approx 2014- heavy before  . Drug use: No     Allergies   Patient has  no known allergies.   Review of Systems Review of Systems  Constitutional: Negative for chills, diaphoresis, fatigue and fever.  HENT: Negative for congestion and rhinorrhea.   Respiratory: Negative for cough, chest tightness, shortness of breath and wheezing.   Cardiovascular: Positive for near-syncope. Negative for chest pain, palpitations and leg swelling.  Gastrointestinal: Positive for nausea and vomiting. Negative for abdominal pain, constipation and diarrhea.  Genitourinary: Negative for dysuria and flank pain.  Musculoskeletal: Negative for back pain, neck pain and neck stiffness.  Skin: Negative for rash and wound.  Neurological: Positive for light-headedness. Negative for dizziness, seizures, syncope, numbness and headaches.  All other systems reviewed and are negative.    Physical Exam Updated Vital Signs BP 139/67   Pulse (!) 52   Temp 98.8 F (37.1 C) (Oral)   Resp 19   SpO2 100%   Physical Exam  Constitutional: He appears well-developed and well-nourished. No distress.  HENT:  Head: Normocephalic and atraumatic.  Mouth/Throat: Oropharynx is clear and moist. No oropharyngeal exudate.  Eyes: Pupils are equal, round, and reactive to light. Conjunctivae and EOM are normal.  Neck: Neck supple.  Cardiovascular: Normal rate and regular rhythm.  No murmur heard. Pulmonary/Chest: Effort normal. No stridor. No respiratory distress. He has no wheezes. He has rhonchi. He has no rales. He exhibits no  tenderness.  Abdominal: Soft. There is no tenderness.  Musculoskeletal: He exhibits no edema or tenderness.  Neurological: He is alert. No sensory deficit. He exhibits normal muscle tone.  Skin: Skin is warm and dry. He is not diaphoretic. No erythema. No pallor.  Psychiatric: He has a normal mood and affect.  Nursing note and vitals reviewed.    ED Treatments / Results  Labs (all labs ordered are listed, but only abnormal results are displayed) Labs Reviewed  CBC WITH DIFFERENTIAL/PLATELET - Abnormal; Notable for the following components:      Result Value   WBC 13.6 (*)    Hemoglobin 10.6 (*)    HCT 35.4 (*)    MCH 24.7 (*)    MCHC 29.9 (*)    RDW 17.6 (*)    Neutro Abs 9.8 (*)    All other components within normal limits  COMPREHENSIVE METABOLIC PANEL - Abnormal; Notable for the following components:   Glucose, Bld 112 (*)    Creatinine, Ser 1.27 (*)    AST 13 (*)    GFR calc non Af Amer 53 (*)    All other components within normal limits  URINE CULTURE  MAGNESIUM  URINALYSIS, ROUTINE W REFLEX MICROSCOPIC  I-STAT TROPONIN, ED  I-STAT TROPONIN, ED    EKG EKG Interpretation  Date/Time:  Friday August 12 2018 12:42:59 EDT Ventricular Rate:  53 PR Interval:    QRS Duration: 106 QT Interval:  509 QTC Calculation: 478 R Axis:   42 Text Interpretation:  Sinus rhythm Borderline prolonged QT interval Whgen compared to priorm no significant changes.  No STEMI Confirmed by Antony Blackbird [phone]) on 08/12/2018 1:31:55 PM   Radiology Dg Chest 2 View  Result Date: 08/12/2018 CLINICAL DATA:  Cough and syncope. Right-sided chest pain extending into the shoulders for 2 days. EXAM: CHEST - 2 VIEW COMPARISON:  Two-view chest x-ray 09/14/2017 FINDINGS: The heart size is normal. Atherosclerotic changes are present at the aortic arch. Prominent pulmonary arteries is again noted. There is no edema or effusion. No focal airspace disease present. The visualized soft tissues and bony  thorax are unremarkable. IMPRESSION: 1. No acute cardiopulmonary disease. 2.  Aortic atherosclerosis. Electronically Signed   By: San Morelle M.D.   On: 08/12/2018 14:10    Procedures Procedures (including critical care time)  Medications Ordered in ED Medications  sodium chloride 0.9 % bolus 1,000 mL (0 mLs Intravenous Stopped 08/12/18 1358)     Initial Impression / Assessment and Plan / ED Course  I have reviewed the triage vital signs and the nursing notes.  Pertinent labs & imaging results that were available during my care of the patient were reviewed by me and considered in my medical decision making (see chart for details).     Russell [name] is a 77 y.o. male with a past medical history significant for COPD, hypertension, hyperlipidemia, diabetes, prostate cancer, diverticulitis, GERD, seizures, and prior syncope who presents for near syncopal episode and cough.  Patient reports that he has had a dry cough for the last few weeks.  He reports that today he was sitting on his porch when he stood up and got lightheaded.  He reports he sat back down and does not think he passed out.  He says that this is happened in the past and he left the emergency department for full evaluation 6 months ago for this.  He reports that he thinks he drinks enough water but has been sitting outside in the heat.  He reports no fever but does report chronic chills.  He reports that he had an episode of nausea and vomiting when he was lightheaded.  He denies recent traumas, head injuries.  He denies any headache, vision changes, conservation, diarrhea, or dysuria.  He reports no palpitations surrounding his episode.  According to EMS report to nursing, patient was found to have a blood pressure of 90 and was slightly diaphoretic when they arrived on scene.  They gave him some fluids and his blood pressure has significantly improved in route.  He also says that he started taking Vicodin several days ago  for a left foot pain managed by podiatry.  On arrival, blood pressure is over 462 systolic.  Patient reports he is feeling better already.  He has no description of chest pain or chest tightness.  He reports he has had some right upper shoulder pain when he is coughing.  He had lungs with very faint coarseness in the bases.  No significant murmur was appreciated on my exam.  Abdomen was nontender.  Patient had symmetric pulses in all extremities.  Patient had no focal neurologic deficits however gait was deferred due to initial lightheadedness.    Clinically I suspect patient was dehydrated sitting on his porch and then stood up causing her to single episode.  I also think patient taking his Vicodin today for the pain may have also contributed.  As patient is mentating normally, do not feel he needs Narcan at this time however he will be given fluids initially and work-up will be performed to look for electrolyte abnormality or occult infection.  Anticipate reassessment after rehydration and work-up.  3:41 PM Nursing reports that while patient was waiting for all of his work-up results and rehydration he eloped.  Nursing reports his wife came to pick him up and he eloped.  He was not complaining of other symptoms to nursing when he left.  I suspect it was a combination of dehydration and the pain medication that caused the patient to have a near syncopal episode.    Patient was also instructed to follow-up with his PCP.  If patient returns, anticipate reassessing his  blood pressures and further rehydration.  Patient eloped in stable condition.  Final Clinical Impressions(s) / ED Diagnoses   Final diagnoses:  Near syncope     Clinical Impression: 1. Near syncope     Disposition: Eloped  This note was prepared with assistance of Systems analyst. Occasional wrong-word or sound-a-like substitutions may have occurred due to the inherent limitations of voice recognition  software.     Lenoard Helbert, Gwenyth Allegra, MD 08/12/18 [id number]

## 2018-08-12 NOTE — ED Notes (Signed)
Pt came out of his room stating he was ready to leave. RN informed patient that provider was in another patients room. Pt and his family member stated that they were going to leave. Pt stated "he felt fine". Provider notified

## 2018-08-12 NOTE — ED Notes (Signed)
Pt seemed upset when RN came into room. Stated he wanted for RN to take out the IV and take off the monitoring equipment. He also stated that nobody is drawing anymore blood from him.

## 2018-08-14 LAB — URINE CULTURE: Culture: >=100000 — AB

## 2018-08-15 ENCOUNTER — Telehealth: Payer: Self-pay | Admitting: Emergency Medicine

## 2018-08-15 NOTE — Telephone Encounter (Signed)
Post ED Visit - Positive Culture Follow-up  Culture report reviewed by antimicrobial stewardship pharmacist:  []  Elenor Quinones, Pharm.D. []  Heide Guile, Pharm.D., BCPS AQ-ID []  Parks Neptune, Pharm.D., BCPS []  Alycia Rossetti, Pharm.D., BCPS []  [place], Pharm.D., BCPS, AAHIVP []  Legrand Como, Pharm.D., BCPS, AAHIVP []  Salome Arnt, PharmD, BCPS []  Johnnette Gourd, PharmD, BCPS []  Hughes Better, PharmD, BCPS []  Leeroy Cha, PharmD  Positive urine culture Treated with none, asymptomatic,no further patient follow-up is required at this time.  Hazle Nordmann 08/15/2018, 3:55 PM

## 2018-08-18 ENCOUNTER — Telehealth: Payer: Self-pay | Admitting: Podiatry

## 2018-08-18 DIAGNOSIS — M7752 Other enthesopathy of left foot: Secondary | ICD-10-CM

## 2018-08-18 DIAGNOSIS — G5762 Lesion of plantar nerve, left lower limb: Secondary | ICD-10-CM

## 2018-08-18 DIAGNOSIS — G5782 Other specified mononeuropathies of left lower limb: Secondary | ICD-10-CM

## 2018-08-18 NOTE — Telephone Encounter (Signed)
I'm calling to request pain medication for my toe. Telephone number is [phone]. Please get in touch with Gwyndolyn Saxon as soon as possible. Thank you.

## 2018-08-18 NOTE — Telephone Encounter (Signed)
NO more pain med.  He cannot come in sooner for pain meds.  He needs pain clinic for chronic pain.

## 2018-08-19 NOTE — Addendum Note (Signed)
Addended by: Harriett Sine D on: 08/19/2018 10:42 AM   Modules accepted: Orders

## 2018-08-19 NOTE — Telephone Encounter (Signed)
Left message informing pt, of Dr. Stephenie Acres orders. Required referral form, clinicals and demographics to [facility].

## 2018-08-23 ENCOUNTER — Encounter (HOSPITAL_COMMUNITY): Payer: Self-pay | Admitting: *Deleted

## 2018-08-23 ENCOUNTER — Emergency Department (HOSPITAL_COMMUNITY)
Admission: EM | Admit: 2018-08-23 | Discharge: 2018-08-23 | Disposition: A | Discharge status: Home / Self Care | Payer: Medicare Other | Attending: Emergency Medicine | Admitting: Emergency Medicine

## 2018-08-23 DIAGNOSIS — N390 Urinary tract infection, site not specified: Secondary | ICD-10-CM | POA: Insufficient documentation

## 2018-08-23 DIAGNOSIS — I10 Essential (primary) hypertension: Secondary | ICD-10-CM | POA: Insufficient documentation

## 2018-08-23 DIAGNOSIS — E119 Type 2 diabetes mellitus without complications: Secondary | ICD-10-CM | POA: Insufficient documentation

## 2018-08-23 DIAGNOSIS — Z79899 Other long term (current) drug therapy: Secondary | ICD-10-CM | POA: Diagnosis not present

## 2018-08-23 DIAGNOSIS — F17210 Nicotine dependence, cigarettes, uncomplicated: Secondary | ICD-10-CM | POA: Diagnosis not present

## 2018-08-23 DIAGNOSIS — J449 Chronic obstructive pulmonary disease, unspecified: Secondary | ICD-10-CM | POA: Diagnosis not present

## 2018-08-23 DIAGNOSIS — R799 Abnormal finding of blood chemistry, unspecified: Secondary | ICD-10-CM | POA: Diagnosis present

## 2018-08-23 LAB — URINALYSIS, ROUTINE W REFLEX MICROSCOPIC
BILIRUBIN URINE: NEGATIVE
Glucose, UA: NEGATIVE mg/dL
HGB URINE DIPSTICK: NEGATIVE
KETONES UR: NEGATIVE mg/dL
Nitrite: POSITIVE — AB
PROTEIN: NEGATIVE mg/dL
SPECIFIC GRAVITY, URINE: 1.015 (ref 1.005–1.030)
pH: 5 (ref 5.0–8.0)

## 2018-08-23 MED ORDER — NITROFURANTOIN MONOHYD MACRO 100 MG PO CAPS: 100.0000 mg | ORAL_CAPSULE | Freq: Two times a day (BID) | ORAL | 0 refills | Status: DC

## 2018-08-23 NOTE — ED Triage Notes (Signed)
Pt states he was called this morning by his doctors office and told to come to the ED to get his urine test repeated, pt denies urinary complaints, pt states he has not been taking any antibiotics currently

## 2018-08-23 NOTE — ED Provider Notes (Signed)
[facility] EMERGENCY DEPARTMENT Provider Note   CSN: [id number] Arrival date & time: 08/23/18  1005     History   Chief Complaint Chief Complaint  Patient presents with  . Abnormal Lab    HPI Russell [name] is a 77 y.o. male.  77 year old male with prior medical history as detailed below presents for evaluation of his urine.  Patient reports that he was here in the ED recently.  He was given a called today telling him that he needed to return to get his urine checked.  He denies dysuria or fever.  He denies back pain.  He denies abdominal pain, nausea, vomiting, or other acute complaint.  He is not currently taking antibiotics.  Chart review reveals a greater than 100,000 count E. coli infection in the urine.  The history is provided by the patient and medical records.  Abnormal Lab  Time since result:  UA - consistent with UTI Patient referred by:  PCP   Past Medical History:  Diagnosis Date  . AKI (acute kidney injury) ([place]) 06/23/2017  . Arthritis    SHOUDLERS, LUMBAR  . BPH with obstruction/lower urinary tract symptoms   . BPH with urinary obstruction 01/15/2016  . COPD (chronic obstructive pulmonary disease) ([place])   . Coronary atherosclerosis    denied knowledge of this 10/27/16  . Depression   . Diverticulosis of colon   . Dyspnea   . ED (erectile dysfunction)   . Full dentures   . GERD (gastroesophageal reflux disease)    not current  . Glaucoma   . History of gastritis   . History of scabies    2008  . 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 History of syncope    03-22-2014  DX  VAGAL RESPONSE  . Hyperlipidemia, mixed   . Hypertension   . Mitral regurgitation   . Peripheral arterial disease ([place])    one vessel runoff bilaterally via peroneal arteries  . Prostate cancer ([place])   . Seizures ([place]) 03/2014   ? or syncope- patient refused to be admitted for further studies- "felt better"  . Type 2 diabetes,  diet controlled ([place])    patient and his wife said no- have not been told 01/21/17  . Wears glasses     Patient Active Problem List   Diagnosis Date Noted  . Osteoarthritis 05/31/2018  . Foot pain, left 05/11/2018  . Chest pain 06/23/2017  . AKI (acute kidney injury) ([place]) 06/23/2017  . Acute respiratory insufficiency   . Surgery, elective   . Radiculopathy 01/27/2017  . Cervical radiculopathy 01/27/2017  . 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  . 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 Peripheral arterial disease ([place]) 01/23/2016  . SLAC (scapholunate advanced collapse) of wrist 11/13/2015  . Fever 02/22/2015  . Epididymitis, left 02/22/2015  . Cellulitis 02/22/2015  . Orchitis 02/22/2015  . Testicular abscess 02/22/2015  . DM type 2 (diabetes mellitus, type 2) ([place]) 02/22/2015  . Microcytic anemia 02/22/2015  . Epididymitis 02/22/2015  . UTI (lower urinary tract infection) 02/22/2015  . Hypotension 03/22/2014  . Neuroma digital nerve 09/28/2013  . 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 Dehydration 07/03/2010  . NAUSEA AND VOMITING 07/03/2010  . BENIGN PROSTATIC HYPERTROPHY, WITH URINARY OBSTRUCTION 06/13/2010  . ARM PAIN 06/03/2010  . DYSPNEA ON EXERTION 05/15/2010  . TOBACCO ABUSE 04/03/2010  . COPD 04/03/2010  . G  E R D 04/03/2010  . OTHER DYSPHAGIA 04/03/2010  . PNEUMONIA, RIGHT LOWER LOBE 02/19/2010  . DIABETES MELLITUS, TYPE II, CONTROLLED 01/14/2010  . LUMBAR RADICULOPATHY, LEFT 05/16/2009  . DYSPHAGIA UNSPECIFIED 05/16/2009  . COUGH DUE TO ACE INHIBITORS 04/02/2009  . DIZZINESS 07/27/2008  . HYPERCHOLESTEROLEMIA, MIXED 05/11/2008  . CATARACTS, BILATERAL 03/01/2008  . MILD SPINAL STENOSIS, CERVICAL 01/12/2008  . LEG CRAMPS 01/12/2008  . EARLY SATIETY 11/04/2007  . DIVERTICULOSIS, COLON 08/31/2007  . SCABIES 08/26/2007  . ERECTILE DYSFUNCTION 08/18/2007  . ABUSE, ALCOHOL SUSPECTED 08/18/2007  . Essential  hypertension 08/17/2007  . HEMOCCULT POSITIVE STOOL 07/05/2007  . ANEMIA, IRON DEFICIENCY NOS 05/31/2007  . WEIGHT LOSS, ABNORMAL 05/10/2006    Past Surgical History:  Procedure Laterality Date  . ANTERIOR CERVICAL DECOMP/DISCECTOMY FUSION  10-16-2009   C4 -- C6  . ANTERIOR CERVICAL DECOMP/DISCECTOMY FUSION N/A 01/27/2017   Procedure: ANTERIOR CERVICAL DECOMPRESSION FUSION CERVICAL 3-4 WITH INSTRUMENTATION AND ALLOGRAFT;  Surgeon: Phylliss Bob, MD;  Location: [facility];  Service: Orthopedics;  Laterality: N/A;  ANTERIOR CERVICAL DECOMPRESSION FUSION CERVICAL 3-4 WITH INSTRUMENTATION AND ALLOGRAFT  . CAPSULOTOMY Right 01/26/2013   Procedure: MINOR CAPSULOTOMY;  Surgeon: Myrtha Mantis., MD;  Location: [facility];  Service: Ophthalmology;  Laterality: Right;  . CARPECTOMY Right 03/13/2014   Procedure: RIGHT  PROXIMAL ROW CARPECTOMY;  Surgeon: Tennis Must, MD;  Location: [place];  Service: Orthopedics;  Laterality: Right;  . CARPECTOMY Left 10/22/2015   Procedure: LEFT WRIST PROXIMAL ROW CARPECTOMY ;  Surgeon: Leanora Cover, MD;  Location: [facility];  Service: Orthopedics;  Laterality: Left;  . CARPOMETACARPAL ([place]) FUSION OF THUMB Right 03/13/2014   Procedure: RIGHT FUSION OF THUMB CARPOMETACARPAL [facility]) JOINT;  Surgeon: Tennis Must, MD;  Location: [facility];  Service: Orthopedics;  Laterality: Right;  . CATARACT EXTRACTION W/ INTRAOCULAR LENS  IMPLANT, BILATERAL  2009  . COLONOSCOPY  08-31-2007  . COLONOSCOPY    . CRYOABLATION N/A 01/14/2015   Procedure: CRYO ABLATION PROSTATE;  Surgeon: Ailene Rud, MD;  Location: [facility];  Service: Urology;  Laterality: N/A;  . ESOPHAGOGASTRODUODENOSCOPY  last one 09-11-2008  . EXCISION MASS LEFT FACE , SUBORBITAL AREA, PLASTER RECONSTRUCTION  02-09-2011  . EXCISIONAL DEBRIDEMENT AND REPAIR RIGHT QUADRICEP TENDON  07-05-2000  . FOOT SURGERY Left   . I&D EXTREMITY Right 05/24/2013    Procedure: RIGHT INDEX WOUND DEBRIDEMENT AND CLOSURE;  Surgeon: Jolyn Nap, MD;  Location: [facility];  Service: Orthopedics;  Laterality: Right;  . INSERTION OF SUPRAPUBIC CATHETER N/A 01/14/2015   Procedure: SUPRAPUBIC TUBE PLACEMENT;  Surgeon: Ailene Rud, MD;  Location: [facility];  Service: Urology;  Laterality: N/A;  . KNEE ARTHROSCOPY Right 2008  . LARYNGOSCOPY AND ESOPHAGOSCOPY  06-13-2010   MARSUPIALIZATION LEFT LARGE VALLECULAR CYST  . MASS EXCISION Left 10/22/2015   Procedure: EXCISION MASS OF LEFT INDEX FINGER;  Surgeon: Leanora Cover, MD;  Location: [facility];  Service: Orthopedics;  Laterality: Left;  . ORCHIECTOMY Left 02/24/2015   Procedure: SCROTAL EXPLORATION WITH LEFT ORCHIECTOMY;  Surgeon: Kathie Rhodes, MD;  Location: [facility];  Service: Urology;  Laterality: Left;  . POSTERIOR CERVICAL FUSION/FORAMINOTOMY N/A 01/28/2017   Procedure: POSTERIOR SPINAL FUSION CERVICAL 3-4, CERVICAL 4-5, CERVICAL 5-6, CERVICAL 6-7 WITH INSTRUMENATION AND ALLOGRAFT;  Surgeon: Phylliss Bob, MD;  Location: [facility];  Service: Orthopedics;  Laterality: N/A;  POSTERIOR SPINAL FUSION CERVICAL 3-4, CERVICAL 4-5, CERVICAL 5-6, CERVICAL  6-7 WITH INSTRUMENATION AND ALLOGRAFT  . SHOULDER ARTHROSCOPY/ DEBRIDEMENT LABRAL TEAR/  BICEPS TENOTOMY Left 09-24-2011  . TONSILLECTOMY  as child  . TRANSTHORACIC ECHOCARDIOGRAM  02-21-2010   normal LVF/  ef 55-60%/ mild to moderate MR/  moderate LAE/  mild TR  . YAG LASER APPLICATION Right 08/29/2425   Procedure: YAG LASER APPLICATION;  Surgeon: Myrtha Mantis., MD;  Location: [facility];  Service: Ophthalmology;  Laterality: Right;  . YAG LASER CAPSULOTOMY, LEFT EYE  01-08-2011        Home Medications    Prior to Admission medications   Medication Sig Start Date End Date Taking? Authorizing Provider  AZOPT 1 % ophthalmic suspension Place 1 drop into the left eye 3 (three) times daily.  12/25/16   [provider]  bimatoprost (LUMIGAN) 0.01 % SOLN Place 1 drop into both eyes at bedtime. 04/10/13   Dhungel, Nishant, MD  brimonidine (ALPHAGAN) 0.2 % ophthalmic solution Place 1 drop into both eyes 2 (two) times daily.  07/22/18   [provider]  brimonidine-timolol (COMBIGAN) 0.2-0.5 % ophthalmic solution Place 1 drop into both eyes 2 (two) times daily.     [provider]  clindamycin (CLEOCIN T) 1 % external solution daily.  03/07/18   [provider]  diclofenac sodium (VOLTAREN) 1 % GEL daily.  03/07/18   [provider]  esomeprazole (NEXIUM) 40 MG capsule Take 1 capsule (40 mg total) by mouth daily. 07/16/17   Isaiah Serge, NP  ferrous sulfate 325 (65 FE) MG tablet daily.     [provider]  gabapentin (NEURONTIN) 300 MG capsule Take 300 mg by mouth 2 (two) times daily.  10/19/16   [provider]  hydrochlorothiazide (HYDRODIURIL) 12.5 MG tablet  07/28/18   [provider]  HYDROcodone-acetaminophen (NORCO/VICODIN) 5-325 MG tablet Take 1 tablet by mouth every 6 (six) hours as needed for moderate pain. 08/09/18   Hyatt, Max T, DPM  ketoconazole (NIZORAL) 2 % cream  03/07/18   [provider]  losartan (COZAAR) 100 MG tablet Take 100 mg by mouth daily. 06/08/17   [provider]  meclizine (ANTIVERT) 25 MG tablet Take 25 mg by mouth daily. 06/16/17   [provider]  Multiple Vitamins-Minerals (CENTRUM SILVER ADULT 50+ PO) Take 1 tablet by mouth daily.    [provider]  oxybutynin (DITROPAN-XL) 10 MG 24 hr tablet TK 1 T PO QD HS 04/26/18   [provider]  oxyCODONE-acetaminophen (PERCOCET) 10-325 MG tablet TK 1 T PO Q 6 H PRN FOR UP TO 5 DAYS FOR PAIN 07/28/18   [provider]  pravastatin (PRAVACHOL) 80 MG tablet Take 80 mg by mouth daily.  10/19/16   [provider]  tamsulosin (FLOMAX) 0.4 MG CAPS capsule Take 0.4 mg by mouth at bedtime.  12/03/16   [provider]    Family History Family History  Problem Relation Age of Onset  . Unexplained death Mother        died at a young age, no known cause  . Heart attack Father        age unknown  . Diabetes Unknown   . Cancer Unknown     Social History Social History   Tobacco Use  . Smoking status: Current Every Day Smoker    Packs/day: 1.00    Years: 59.00    Pack years: 59.00    Types: Cigarettes  . Smokeless tobacco: Never Used  . Tobacco comment:  Started Chantix 10/26/16  Substance Use Topics  . Alcohol use: No    Comment: none since approx 2014- heavy before  . Drug use: No     Allergies   Patient has no known allergies.   Review of Systems Review of Systems  All other systems reviewed and are negative.    Physical Exam Updated Vital Signs BP 123/73 (BP Location: Right Arm)   Pulse 77   Temp 98.4 F (36.9 C) (Oral)   Resp 18   SpO2 100%   Physical Exam  Constitutional: He is oriented to person, place, and time. He appears well-developed and well-nourished. No distress.  HENT:  Head: Normocephalic and atraumatic.  Mouth/Throat: Oropharynx is clear and moist.  Eyes: Pupils are equal, round, and reactive to light. Conjunctivae and EOM are normal.  Neck: Normal range of motion. Neck supple.  Cardiovascular: Normal rate, regular rhythm and normal heart sounds.  Pulmonary/Chest: Effort normal and breath sounds normal. No respiratory distress.  Abdominal: Soft. He exhibits no distension. There is no tenderness.  Musculoskeletal: Normal range of motion. He exhibits no edema or deformity.  Neurological: He is alert and oriented to person, place, and time.  Skin: Skin is warm and dry.  Psychiatric: He has a normal mood and affect.  Nursing note and vitals reviewed.    ED Treatments / Results  Labs (all labs ordered are listed, but only abnormal results are displayed) Labs Reviewed  URINALYSIS, ROUTINE W REFLEX MICROSCOPIC - Abnormal; Notable for the following  components:      Result Value   APPearance CLOUDY (*)    Nitrite POSITIVE (*)    Leukocytes, UA LARGE (*)    WBC, UA >50 (*)    Bacteria, UA MANY (*)    All other components within normal limits    EKG None  Radiology No results found.  Procedures Procedures (including critical care time)  Medications Ordered in ED Medications - No data to display   Initial Impression / Assessment and Plan / ED Course  I have reviewed the triage vital signs and the nursing notes.  Pertinent labs & imaging results that were available during my care of the patient were reviewed by me and considered in my medical decision making (see chart for details).     MDM  Screen complete  Patient is presenting for evaluation of urine.  Patient with likely UTI -- although he is fairly asymptomatic.  Patient will be treated for his UTI.  He understands the need for close follow-up.  Strict return precautions given and understood.  Final Clinical Impressions(s) / ED Diagnoses   Final diagnoses:  Urinary tract infection without hematuria, site unspecified    ED Discharge Orders         Ordered    nitrofurantoin, macrocrystal-monohydrate, (MACROBID) 100 MG capsule  2 times daily     08/23/18 1232           Valarie Merino, MD 08/23/18 1233

## 2018-08-23 NOTE — ED Notes (Signed)
Pt verbalized understanding of d/c instructions and has no further questions, VSS, NAD.  

## 2018-08-23 NOTE — ED Notes (Signed)
Results reviewed.  No changes in acuity at this time 

## 2018-08-23 NOTE — ED Notes (Signed)
Pt refused to put gown. Pt stated that" I just wants to find out what's wrong and go"

## 2018-08-30 ENCOUNTER — Ambulatory Visit (INDEPENDENT_AMBULATORY_CARE_PROVIDER_SITE_OTHER): Payer: Medicare Other | Admitting: Podiatry

## 2018-08-30 DIAGNOSIS — M779 Enthesopathy, unspecified: Secondary | ICD-10-CM | POA: Diagnosis not present

## 2018-08-30 DIAGNOSIS — M7752 Other enthesopathy of left foot: Secondary | ICD-10-CM

## 2018-08-30 MED ORDER — HYDROCODONE-ACETAMINOPHEN 5-325 MG PO TABS: 1.0000 | ORAL_TABLET | Freq: Four times a day (QID) | ORAL | 0 refills | Status: DC | PRN

## 2018-08-30 NOTE — Progress Notes (Signed)
He presents today states that the injection helped for about 2 to 3 weeks today fourth toe left foot.  But all in all it is still quite painful.  He states that I am having similar pains in my left humerus and they are talking about drilling holes in my arm to put some type of injection in.  I am not sure about this.  Objective: Vital signs are stable he is alert and oriented x3.  Pulses are nonpalpable.  He has peripheral vascular disease confirmed by vascular surgery.  Fourth toe left foot does demonstrate tenderness on palpation of the distal lateral aspect.  Assessment: Capsulitis fourth PIPJ DIPJ area left foot.  Plan: Injected the area today with 2 mg of dexamethasone and local anesthetic after sterile Betadine skin prep tolerated procedure without complications follow-up with him in 1 month also provided him with oral narcotics.

## 2018-09-20 ENCOUNTER — Encounter: Payer: Self-pay | Admitting: Podiatry

## 2018-09-20 ENCOUNTER — Ambulatory Visit (INDEPENDENT_AMBULATORY_CARE_PROVIDER_SITE_OTHER): Payer: Medicare Other | Admitting: Podiatry

## 2018-09-20 DIAGNOSIS — M7752 Other enthesopathy of left foot: Secondary | ICD-10-CM

## 2018-09-20 MED ORDER — OXYCODONE-ACETAMINOPHEN 10-325 MG PO TABS: ORAL_TABLET | ORAL | 0 refills | Status: DC

## 2018-09-20 NOTE — Progress Notes (Signed)
He presents today for follow-up of his neuritis and capsulitis fourth toe left foot.  He said it is aching and is numb it hurts so bad still him a Percocet.  Objective: Vital signs are stable alert and oriented x3.  There is no erythema edema saline strange odor he has pain on palpation third interspace of the left foot.  Assessment: Neuritis.  Plan: I injected the area today with dehydrated alcohol obstructive sterile Betadine skin prep tolerated procedure well without complications follow-up with him in 3 weeks for a prescription for 20 Percocet.

## 2018-10-18 ENCOUNTER — Encounter: Payer: Self-pay | Admitting: Podiatry

## 2018-10-18 ENCOUNTER — Ambulatory Visit (INDEPENDENT_AMBULATORY_CARE_PROVIDER_SITE_OTHER): Payer: Medicare Other | Admitting: Podiatry

## 2018-10-18 DIAGNOSIS — G5792 Unspecified mononeuropathy of left lower limb: Secondary | ICD-10-CM | POA: Diagnosis not present

## 2018-10-18 MED ORDER — OXYCODONE-ACETAMINOPHEN 10-325 MG PO TABS: 1.0000 | ORAL_TABLET | ORAL | 0 refills | Status: DC | PRN

## 2018-10-18 NOTE — Progress Notes (Signed)
He presents today to be reevaluated for pain between the third and fourth metatarsals.  Objective: Palpable neuroma is present.  He still has pain on deep palpation of the mid diaphyseal region of the intermetatarsal space which radiating with radiating pain distally.  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  Plan: Injected dehydrated alcohol today after sterile Betadine skin prep 2 cc 4% dehydrated alcohol with local anesthetic.  Also wrote a prescription for 20 oxycodone.  Follow-up with him in 3 weeks

## 2018-11-08 ENCOUNTER — Ambulatory Visit (INDEPENDENT_AMBULATORY_CARE_PROVIDER_SITE_OTHER): Payer: Medicare Other | Admitting: Podiatry

## 2018-11-08 ENCOUNTER — Encounter: Payer: Self-pay | Admitting: Podiatry

## 2018-11-08 DIAGNOSIS — G5792 Unspecified mononeuropathy of left lower limb: Secondary | ICD-10-CM | POA: Diagnosis not present

## 2018-11-08 MED ORDER — OXYCODONE-ACETAMINOPHEN 10-325 MG PO TABS: 1.0000 | ORAL_TABLET | Freq: Four times a day (QID) | ORAL | 0 refills | Status: AC | PRN

## 2018-11-09 NOTE — Progress Notes (Signed)
He presents today for follow-up of his stump neuroma third interdigital space left foot.  He states that is hurting so bad but it has been helping with the injections.  He states that he just does not last until my next visit.  Objective: Vital signs are stable he is alert and oriented x3.  Pulses are palpable.  His pain on palpation third interdigital space just proximal to the metatarsal heads.  No erythema edema saline strange odor.  Assessment: Number neuroma third interspace left.  Plan: Injected 2 cc dehydrated alcohol today after sterile Betadine skin prep he tolerated procedure well without complications dispensed a prescription for Vicodin No. 21 every 6 hours as needed for pain he will make this last all month.

## 2018-11-29 ENCOUNTER — Ambulatory Visit (INDEPENDENT_AMBULATORY_CARE_PROVIDER_SITE_OTHER): Payer: Medicare Other | Admitting: Podiatry

## 2018-11-29 ENCOUNTER — Encounter: Payer: Self-pay | Admitting: Podiatry

## 2018-11-29 DIAGNOSIS — G5762 Lesion of plantar nerve, left lower limb: Secondary | ICD-10-CM | POA: Diagnosis not present

## 2018-11-29 DIAGNOSIS — G5782 Other specified mononeuropathies of left lower limb: Secondary | ICD-10-CM

## 2018-11-29 MED ORDER — HYDROCODONE-ACETAMINOPHEN 10-325 MG PO TABS: 1.0000 | ORAL_TABLET | Freq: Four times a day (QID) | ORAL | 0 refills | Status: DC | PRN

## 2018-11-29 NOTE — Progress Notes (Signed)
He presents today stating that the neuroma injections which are dehydrated alcohol are helping but really the last couple of days is been hurting like crazy he states that it feels like fire at the end of his third and fourth toe of his left foot.  Objective: Vital signs are stable he is alert and oriented x3.  Pulses are palpable.  There is no erythema edema sialitis drainage or odor he has pain on palpation of the third interdigital space distally.  Right in the midline of his scar is painful.  Assessment: Neuroma stump neuroma third interspace left.  Plan: Dehydrated alcohol injection directly into the previous surgical scar third interdigital space left.  Follow-up with him in 3 weeks but I provided him with prescription for pain medication.

## 2018-12-19 ENCOUNTER — Other Ambulatory Visit: Payer: Self-pay | Admitting: Orthopedic Surgery

## 2018-12-26 ENCOUNTER — Encounter (HOSPITAL_BASED_OUTPATIENT_CLINIC_OR_DEPARTMENT_OTHER): Payer: Self-pay | Admitting: *Deleted

## 2018-12-26 ENCOUNTER — Other Ambulatory Visit: Payer: Self-pay

## 2018-12-27 ENCOUNTER — Encounter (HOSPITAL_BASED_OUTPATIENT_CLINIC_OR_DEPARTMENT_OTHER)
Admission: RE | Admit: 2018-12-27 | Discharge: 2018-12-27 | Disposition: A | Discharge status: Home / Self Care | Payer: Medicare Other | Source: Ambulatory Visit | Attending: Orthopedic Surgery | Admitting: Orthopedic Surgery

## 2018-12-27 DIAGNOSIS — Z01812 Encounter for preprocedural laboratory examination: Secondary | ICD-10-CM | POA: Diagnosis present

## 2018-12-27 LAB — BASIC METABOLIC PANEL
Anion gap: 10 (ref 5–15)
BUN: 18 mg/dL (ref 8–23)
CO2: 24 mmol/L (ref 22–32)
Calcium: 9.6 mg/dL (ref 8.9–10.3)
Chloride: 105 mmol/L (ref 98–111)
Creatinine, Ser: 1.27 mg/dL — ABNORMAL HIGH (ref 0.61–1.24)
GFR calc Af Amer: >60 mL/min (ref >60)
GFR calc non Af Amer: 54 mL/min — ABNORMAL LOW (ref >60)
Glucose, Bld: 100 mg/dL — ABNORMAL HIGH (ref 70–99)
Potassium: 4.1 mmol/L (ref 3.5–5.1)
Sodium: 139 mmol/L (ref 135–145)

## 2018-12-29 ENCOUNTER — Telehealth: Payer: Self-pay | Admitting: *Deleted

## 2018-12-29 ENCOUNTER — Encounter: Payer: Self-pay | Admitting: Neurology

## 2018-12-29 ENCOUNTER — Encounter: Payer: Self-pay | Admitting: Podiatry

## 2018-12-29 ENCOUNTER — Ambulatory Visit (INDEPENDENT_AMBULATORY_CARE_PROVIDER_SITE_OTHER): Payer: Medicare Other | Admitting: Podiatry

## 2018-12-29 DIAGNOSIS — G5792 Unspecified mononeuropathy of left lower limb: Secondary | ICD-10-CM

## 2018-12-29 DIAGNOSIS — G5782 Other specified mononeuropathies of left lower limb: Secondary | ICD-10-CM

## 2018-12-29 DIAGNOSIS — G5732 Lesion of lateral popliteal nerve, left lower limb: Secondary | ICD-10-CM

## 2018-12-29 DIAGNOSIS — G5762 Lesion of plantar nerve, left lower limb: Secondary | ICD-10-CM | POA: Diagnosis not present

## 2018-12-29 MED ORDER — HYDROCODONE-ACETAMINOPHEN 10-325 MG PO TABS: 1.0000 | ORAL_TABLET | Freq: Four times a day (QID) | ORAL | 0 refills | Status: DC | PRN

## 2018-12-29 NOTE — Telephone Encounter (Signed)
Faxed orders to [facility]. 

## 2018-12-29 NOTE — Progress Notes (Signed)
He presents today for follow-up of his neuroma third interdigital space of his left foot.  He states that it is worse is gone all the way up to my knee.  Objective: Vital signs are stable he is alert oriented x3 he has complete loss of official peroneal sensory nerves from his common peroneal nerve distally tibial nerve and sural nerve are intact.  He has pain on palpation third interdigital space left foot.  Assessment: Probable back radiculopathy or impingement resulting in the peroneal anesthesia.  Neuroma third interdigital space left.  Plan: Injected dehydrated alcohol to the third interdigital space left.  Schedule him for a nerve conduction velocity exam with neurology follow-up.  Also wrote a prescription for Percocet.

## 2018-12-29 NOTE — Telephone Encounter (Signed)
[facility] states she needs clinicals for pt. Faxed 12/29/2018 clinicals to [facility].

## 2018-12-29 NOTE — Telephone Encounter (Signed)
-----   Message from Shell sent at 12/29/2018 10:27 AM EST ----- Regarding: NCVE Nerve conduction test - evaluate numbness and pain lower extremity left

## 2019-01-02 ENCOUNTER — Ambulatory Visit (HOSPITAL_BASED_OUTPATIENT_CLINIC_OR_DEPARTMENT_OTHER)
Admission: RE | Admit: 2019-01-02 | Discharge: 2019-01-02 | Disposition: A | Discharge status: Home / Self Care | Payer: Medicare Other | Attending: Orthopedic Surgery | Admitting: Orthopedic Surgery

## 2019-01-02 ENCOUNTER — Ambulatory Visit (HOSPITAL_BASED_OUTPATIENT_CLINIC_OR_DEPARTMENT_OTHER): Payer: Medicare Other | Admitting: Anesthesiology

## 2019-01-02 ENCOUNTER — Encounter (HOSPITAL_BASED_OUTPATIENT_CLINIC_OR_DEPARTMENT_OTHER): Payer: Self-pay | Admitting: *Deleted

## 2019-01-02 ENCOUNTER — Other Ambulatory Visit: Payer: Self-pay

## 2019-01-02 ENCOUNTER — Encounter (HOSPITAL_BASED_OUTPATIENT_CLINIC_OR_DEPARTMENT_OTHER)
Admission: RE | Disposition: A | Discharge status: Home / Self Care | Payer: Self-pay | Source: Home / Self Care | Attending: Orthopedic Surgery

## 2019-01-02 DIAGNOSIS — Z79899 Other long term (current) drug therapy: Secondary | ICD-10-CM | POA: Insufficient documentation

## 2019-01-02 DIAGNOSIS — F17210 Nicotine dependence, cigarettes, uncomplicated: Secondary | ICD-10-CM | POA: Insufficient documentation

## 2019-01-02 DIAGNOSIS — E1151 Type 2 diabetes mellitus with diabetic peripheral angiopathy without gangrene: Secondary | ICD-10-CM | POA: Insufficient documentation

## 2019-01-02 DIAGNOSIS — H409 Unspecified glaucoma: Secondary | ICD-10-CM | POA: Diagnosis not present

## 2019-01-02 DIAGNOSIS — Z8546 Personal history of malignant neoplasm of prostate: Secondary | ICD-10-CM | POA: Insufficient documentation

## 2019-01-02 DIAGNOSIS — M1990 Unspecified osteoarthritis, unspecified site: Secondary | ICD-10-CM | POA: Diagnosis not present

## 2019-01-02 DIAGNOSIS — F329 Major depressive disorder, single episode, unspecified: Secondary | ICD-10-CM | POA: Insufficient documentation

## 2019-01-02 DIAGNOSIS — E782 Mixed hyperlipidemia: Secondary | ICD-10-CM | POA: Insufficient documentation

## 2019-01-02 DIAGNOSIS — I2510 Atherosclerotic heart disease of native coronary artery without angina pectoris: Secondary | ICD-10-CM | POA: Diagnosis not present

## 2019-01-02 DIAGNOSIS — G5602 Carpal tunnel syndrome, left upper limb: Secondary | ICD-10-CM | POA: Insufficient documentation

## 2019-01-02 DIAGNOSIS — I10 Essential (primary) hypertension: Secondary | ICD-10-CM | POA: Insufficient documentation

## 2019-01-02 DIAGNOSIS — K219 Gastro-esophageal reflux disease without esophagitis: Secondary | ICD-10-CM | POA: Insufficient documentation

## 2019-01-02 DIAGNOSIS — Z981 Arthrodesis status: Secondary | ICD-10-CM | POA: Insufficient documentation

## 2019-01-02 DIAGNOSIS — J449 Chronic obstructive pulmonary disease, unspecified: Secondary | ICD-10-CM | POA: Diagnosis not present

## 2019-01-02 HISTORY — PX: CARPAL TUNNEL RELEASE: SHX101

## 2019-01-02 LAB — GLUCOSE, CAPILLARY: Glucose-Capillary: 105 mg/dL — ABNORMAL HIGH (ref 70–99)

## 2019-01-02 SURGERY — CARPAL TUNNEL RELEASE
Anesthesia: Monitor Anesthesia Care | Site: Wrist | Laterality: Left

## 2019-01-02 MED ORDER — LIDOCAINE 2% (20 MG/ML) 5 ML SYRINGE
INTRAMUSCULAR | Status: AC
  Filled 2019-01-02: qty 5

## 2019-01-02 MED ORDER — CEFAZOLIN SODIUM-DEXTROSE 2-4 GM/100ML-% IV SOLN
2.0000 g | INTRAVENOUS | Status: AC
  Administered 2019-01-02: 2 g via INTRAVENOUS

## 2019-01-02 MED ORDER — ONDANSETRON HCL 4 MG/2ML IJ SOLN
INTRAMUSCULAR | Status: AC
  Filled 2019-01-02: qty 2

## 2019-01-02 MED ORDER — PROPOFOL 500 MG/50ML IV EMUL
INTRAVENOUS | Status: AC
  Filled 2019-01-02: qty 50

## 2019-01-02 MED ORDER — SUCCINYLCHOLINE CHLORIDE 200 MG/10ML IV SOSY
PREFILLED_SYRINGE | INTRAVENOUS | Status: AC
  Filled 2019-01-02: qty 10

## 2019-01-02 MED ORDER — ONDANSETRON HCL 4 MG/2ML IJ SOLN
INTRAMUSCULAR | Status: DC | PRN
  Administered 2019-01-02: 4 mg via INTRAVENOUS

## 2019-01-02 MED ORDER — LIDOCAINE HCL (PF) 0.5 % IJ SOLN
INTRAMUSCULAR | Status: DC | PRN
  Administered 2019-01-02: 35 mL via INTRAVENOUS

## 2019-01-02 MED ORDER — SCOPOLAMINE 1 MG/3DAYS TD PT72: 1.0000 | MEDICATED_PATCH | Freq: Once | TRANSDERMAL | Status: DC | PRN

## 2019-01-02 MED ORDER — LACTATED RINGERS IV SOLN
INTRAVENOUS | Status: DC
  Administered 2019-01-02 (×2): via INTRAVENOUS

## 2019-01-02 MED ORDER — CEFAZOLIN SODIUM-DEXTROSE 2-4 GM/100ML-% IV SOLN
INTRAVENOUS | Status: AC
  Filled 2019-01-02: qty 100

## 2019-01-02 MED ORDER — OXYCODONE HCL 5 MG PO TABS: 5.0000 mg | ORAL_TABLET | Freq: Once | ORAL | Status: DC | PRN

## 2019-01-02 MED ORDER — MIDAZOLAM HCL 2 MG/2ML IJ SOLN: 1.0000 mg | INTRAMUSCULAR | Status: DC | PRN

## 2019-01-02 MED ORDER — CHLORHEXIDINE GLUCONATE 4 % EX LIQD: 60.0000 mL | Freq: Once | CUTANEOUS | Status: DC

## 2019-01-02 MED ORDER — ONDANSETRON HCL 4 MG/2ML IJ SOLN: 4.0000 mg | Freq: Four times a day (QID) | INTRAMUSCULAR | Status: DC | PRN

## 2019-01-02 MED ORDER — PROPOFOL 500 MG/50ML IV EMUL
INTRAVENOUS | Status: DC | PRN
  Administered 2019-01-02: 75 ug/kg/min via INTRAVENOUS

## 2019-01-02 MED ORDER — HYDROCODONE-ACETAMINOPHEN 5-325 MG PO TABS: ORAL_TABLET | ORAL | 0 refills | Status: DC

## 2019-01-02 MED ORDER — FENTANYL CITRATE (PF) 100 MCG/2ML IJ SOLN: 50.0000 ug | INTRAMUSCULAR | Status: DC | PRN

## 2019-01-02 MED ORDER — LIDOCAINE 2% (20 MG/ML) 5 ML SYRINGE
INTRAMUSCULAR | Status: DC | PRN
  Administered 2019-01-02: 40 mg via INTRAVENOUS

## 2019-01-02 MED ORDER — OXYCODONE HCL 5 MG/5ML PO SOLN: 5.0000 mg | Freq: Once | ORAL | Status: DC | PRN

## 2019-01-02 MED ORDER — FENTANYL CITRATE (PF) 100 MCG/2ML IJ SOLN: 25.0000 ug | INTRAMUSCULAR | Status: DC | PRN

## 2019-01-02 MED ORDER — FENTANYL CITRATE (PF) 100 MCG/2ML IJ SOLN
INTRAMUSCULAR | Status: AC
  Filled 2019-01-02: qty 2

## 2019-01-02 MED ORDER — BUPIVACAINE HCL (PF) 0.25 % IJ SOLN
INTRAMUSCULAR | Status: DC | PRN
  Administered 2019-01-02: 10 mL

## 2019-01-02 MED ORDER — PROPOFOL 10 MG/ML IV BOLUS
INTRAVENOUS | Status: DC | PRN
  Administered 2019-01-02: 20 mg via INTRAVENOUS
  Administered 2019-01-02: 10 mg via INTRAVENOUS
  Administered 2019-01-02 (×2): 20 mg via INTRAVENOUS
  Administered 2019-01-02: 10 mg via INTRAVENOUS
  Administered 2019-01-02: 20 mg via INTRAVENOUS

## 2019-01-02 SURGICAL SUPPLY — 35 items
BANDAGE ACE 3X5.8 VEL STRL LF (GAUZE/BANDAGES/DRESSINGS) ×3 IMPLANT
BLADE SURG 15 STRL LF DISP TIS (BLADE) ×2 IMPLANT
BLADE SURG 15 STRL SS (BLADE) ×6
BNDG CMPR 9X4 STRL LF SNTH (GAUZE/BANDAGES/DRESSINGS) ×1
BNDG ESMARK 4X9 LF (GAUZE/BANDAGES/DRESSINGS) ×2 IMPLANT
BNDG GAUZE ELAST 4 BULKY (GAUZE/BANDAGES/DRESSINGS) ×3 IMPLANT
CHLORAPREP W/TINT 26ML (MISCELLANEOUS) ×3 IMPLANT
CORD BIPOLAR FORCEPS 12FT (ELECTRODE) ×3 IMPLANT
COVER BACK TABLE 60X90IN (DRAPES) ×3 IMPLANT
COVER MAYO STAND STRL (DRAPES) ×3 IMPLANT
COVER WAND RF STERILE (DRAPES) IMPLANT
CUFF TOURNIQUET SINGLE 18IN (TOURNIQUET CUFF) ×3 IMPLANT
DRAPE EXTREMITY T 121X128X90 (DISPOSABLE) ×3 IMPLANT
DRAPE SURG 17X23 STRL (DRAPES) ×3 IMPLANT
DRSG PAD ABDOMINAL 8X10 ST (GAUZE/BANDAGES/DRESSINGS) ×3 IMPLANT
GAUZE SPONGE 4X4 12PLY STRL (GAUZE/BANDAGES/DRESSINGS) ×3 IMPLANT
GAUZE XEROFORM 1X8 LF (GAUZE/BANDAGES/DRESSINGS) ×3 IMPLANT
GLOVE BIO SURGEON STRL SZ7.5 (GLOVE) ×3 IMPLANT
GLOVE BIOGEL PI IND STRL 8 (GLOVE) ×1 IMPLANT
GLOVE BIOGEL PI INDICATOR 8 (GLOVE) ×6
GOWN STRL REUS W/ TWL LRG LVL3 (GOWN DISPOSABLE) ×1 IMPLANT
GOWN STRL REUS W/TWL LRG LVL3 (GOWN DISPOSABLE) ×5 IMPLANT
GOWN STRL REUS W/TWL XL LVL3 (GOWN DISPOSABLE) ×3 IMPLANT
NDL HYPO 25X1 1.5 SAFETY (NEEDLE) ×1 IMPLANT
NEEDLE HYPO 25X1 1.5 SAFETY (NEEDLE) ×3 IMPLANT
NS IRRIG 1000ML POUR BTL (IV SOLUTION) ×3 IMPLANT
PACK BASIN DAY SURGERY FS (CUSTOM PROCEDURE TRAY) ×3 IMPLANT
PADDING CAST ABS 4INX4YD NS (CAST SUPPLIES) ×2
PADDING CAST ABS COTTON 4X4 ST (CAST SUPPLIES) ×1 IMPLANT
STOCKINETTE 4X48 STRL (DRAPES) ×3 IMPLANT
SUT ETHILON 4 0 PS 2 18 (SUTURE) ×3 IMPLANT
SYR BULB 3OZ (MISCELLANEOUS) ×3 IMPLANT
SYR CONTROL 10ML LL (SYRINGE) ×3 IMPLANT
TOWEL GREEN STERILE FF (TOWEL DISPOSABLE) ×6 IMPLANT
UNDERPAD 30X30 (UNDERPADS AND DIAPERS) ×3 IMPLANT

## 2019-01-02 NOTE — Anesthesia Preprocedure Evaluation (Signed)
Anesthesia Evaluation  Patient identified by MRN, date of birth, ID band Patient awake    Reviewed: Allergy & Precautions, H&P , NPO status , Patient's Chart, lab work & pertinent test results  Airway Mallampati: II   Neck ROM: full    Dental   Pulmonary shortness of breath, COPD, Current Smoker,    breath sounds clear to auscultation       Cardiovascular hypertension, + Peripheral Vascular Disease   Rhythm:regular Rate:Normal     Neuro/Psych Seizures -,  PSYCHIATRIC DISORDERS Depression  Neuromuscular disease    GI/Hepatic GERD  ,  Endo/Other  diabetes, Type 2  Renal/GU Renal disease   Prostate CA    Musculoskeletal  (+) Arthritis ,   Abdominal   Peds  Hematology   Anesthesia Other Findings   Reproductive/Obstetrics                             Anesthesia Physical Anesthesia Plan  ASA: III  Anesthesia Plan: MAC and Bier Block and Bier Block-LIDOCAINE ONLY   Post-op Pain Management:    Induction: Intravenous  PONV Risk Score and Plan: 0 and Propofol infusion, Ondansetron and Treatment may vary due to age or medical condition  Airway Management Planned: Simple Face Mask  Additional Equipment:   Intra-op Plan:   Post-operative Plan:   Informed Consent: I have reviewed the patients History and Physical, chart, labs and discussed the procedure including the risks, benefits and alternatives for the proposed anesthesia with the patient or authorized representative who has indicated his/her understanding and acceptance.     Plan Discussed with: CRNA, Anesthesiologist and Surgeon  Anesthesia Plan Comments:         Anesthesia Quick Evaluation

## 2019-01-02 NOTE — H&P (Signed)
Russell [name] is an 78 y.o. male.   Chief Complaint: left carpal tunnel syndrome HPI: 78 yo male with numbness and tingling left hand.  Positive nerve conduction studies.  This is bothersome to him and he wishes to have a carpal tunnel release.  Allergies: No Known Allergies  Past Medical History:  Diagnosis Date  . AKI (acute kidney injury) ([place]) 06/23/2017  . Arthritis    SHOUDLERS, LUMBAR  . BPH with obstruction/lower urinary tract symptoms   . BPH with urinary obstruction 01/15/2016  . COPD (chronic obstructive pulmonary disease) ([place])   . Coronary atherosclerosis    denied knowledge of this 10/27/16  . Depression   . Diverticulosis of colon   . Dyspnea   . ED (erectile dysfunction)   . Full dentures   . GERD (gastroesophageal reflux disease)    not current  . Glaucoma   . History of gastritis   . History of scabies    2008  . 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 History of syncope    03-22-2014  DX  VAGAL RESPONSE  . Hyperlipidemia, mixed   . Hypertension   . Mitral regurgitation   . Peripheral arterial disease ([place])    one vessel runoff bilaterally via peroneal arteries  . Prostate cancer ([place])   . Seizures ([place]) 03/2014   ? or syncope- patient refused to be admitted for further studies- "felt better"  . Type 2 diabetes, diet controlled ([place])    patient and his wife said no- have not been told 01/21/17  . Wears glasses     Past Surgical History:  Procedure Laterality Date  . ANTERIOR CERVICAL DECOMP/DISCECTOMY FUSION  10-16-2009   C4 -- C6  . ANTERIOR CERVICAL DECOMP/DISCECTOMY FUSION N/A 01/27/2017   Procedure: ANTERIOR CERVICAL DECOMPRESSION FUSION CERVICAL 3-4 WITH INSTRUMENTATION AND ALLOGRAFT;  Surgeon: Phylliss Bob, MD;  Location: [place];  Service: Orthopedics;  Laterality: N/A;  ANTERIOR CERVICAL DECOMPRESSION FUSION CERVICAL 3-4 WITH INSTRUMENTATION AND ALLOGRAFT  . CAPSULOTOMY Right 01/26/2013   Procedure: MINOR CAPSULOTOMY;   Surgeon: Myrtha Mantis., MD;  Location: [place];  Service: Ophthalmology;  Laterality: Right;  . CARPECTOMY Right 03/13/2014   Procedure: RIGHT  PROXIMAL ROW CARPECTOMY;  Surgeon: Tennis Must, MD;  Location: [place];  Service: Orthopedics;  Laterality: Right;  . CARPECTOMY Left 10/22/2015   Procedure: LEFT WRIST PROXIMAL ROW CARPECTOMY ;  Surgeon: Leanora Cover, MD;  Location: [place];  Service: Orthopedics;  Laterality: Left;  . CARPOMETACARPAL ([place]) FUSION OF THUMB Right 03/13/2014   Procedure: RIGHT FUSION OF THUMB CARPOMETACARPAL [facility]) JOINT;  Surgeon: Tennis Must, MD;  Location: [place];  Service: Orthopedics;  Laterality: Right;  . CATARACT EXTRACTION W/ INTRAOCULAR LENS  IMPLANT, BILATERAL  2009  . COLONOSCOPY  08-31-2007  . COLONOSCOPY    . CRYOABLATION N/A 01/14/2015   Procedure: CRYO ABLATION PROSTATE;  Surgeon: Ailene Rud, MD;  Location: [facility];  Service: Urology;  Laterality: N/A;  . ESOPHAGOGASTRODUODENOSCOPY  last one 09-11-2008  . EXCISION MASS LEFT FACE , SUBORBITAL AREA, PLASTER RECONSTRUCTION  02-09-2011  . EXCISIONAL DEBRIDEMENT AND REPAIR RIGHT QUADRICEP TENDON  07-05-2000  . FOOT SURGERY Left   . I&D EXTREMITY Right 05/24/2013   Procedure: RIGHT INDEX WOUND DEBRIDEMENT AND CLOSURE;  Surgeon: Jolyn Nap, MD;  Location: [place];  Service: Orthopedics;  Laterality: Right;  .  INSERTION OF SUPRAPUBIC CATHETER N/A 01/14/2015   Procedure: SUPRAPUBIC TUBE PLACEMENT;  Surgeon: Ailene Rud, MD;  Location: [facility];  Service: Urology;  Laterality: N/A;  . KNEE ARTHROSCOPY Right 2008  . LARYNGOSCOPY AND ESOPHAGOSCOPY  06-13-2010   MARSUPIALIZATION LEFT LARGE VALLECULAR CYST  . MASS EXCISION Left 10/22/2015   Procedure: EXCISION MASS OF LEFT INDEX FINGER;  Surgeon: Leanora Cover, MD;  Location: [place];  Service: Orthopedics;   Laterality: Left;  . ORCHIECTOMY Left 02/24/2015   Procedure: SCROTAL EXPLORATION WITH LEFT ORCHIECTOMY;  Surgeon: Kathie Rhodes, MD;  Location: [place];  Service: Urology;  Laterality: Left;  . POSTERIOR CERVICAL FUSION/FORAMINOTOMY N/A 01/28/2017   Procedure: POSTERIOR SPINAL FUSION CERVICAL 3-4, CERVICAL 4-5, CERVICAL 5-6, CERVICAL 6-7 WITH INSTRUMENATION AND ALLOGRAFT;  Surgeon: Phylliss Bob, MD;  Location: [place];  Service: Orthopedics;  Laterality: N/A;  POSTERIOR SPINAL FUSION CERVICAL 3-4, CERVICAL 4-5, CERVICAL 5-6, CERVICAL 6-7 WITH INSTRUMENATION AND ALLOGRAFT  . SHOULDER ARTHROSCOPY/ DEBRIDEMENT LABRAL TEAR/  BICEPS TENOTOMY Left 09-24-2011  . TONSILLECTOMY  as child  . TRANSTHORACIC ECHOCARDIOGRAM  02-21-2010   normal LVF/  ef 55-60%/ mild to moderate MR/  moderate LAE/  mild TR  . YAG LASER APPLICATION Right 07/29/3809   Procedure: YAG LASER APPLICATION;  Surgeon: Myrtha Mantis., MD;  Location: [place];  Service: Ophthalmology;  Laterality: Right;  . YAG LASER CAPSULOTOMY, LEFT EYE  01-08-2011    Family History: Family History  Problem Relation Age of Onset  . Unexplained death Mother        died at a young age, no known cause  . Heart attack Father        age unknown  . Diabetes Other   . Cancer Other     Social History:   reports that he has been smoking cigarettes. He has a 59.00 pack-year smoking history. He has never used smokeless tobacco. He reports that he does not drink alcohol or use drugs.  Medications: Medications Prior to Admission  Medication Sig Dispense Refill  . AZOPT 1 % ophthalmic suspension Place 1 drop into the left eye 3 (three) times daily.     . bimatoprost (LUMIGAN) 0.01 % SOLN Place 1 drop into both eyes at bedtime. 30 mL 3  . brimonidine (ALPHAGAN) 0.2 % ophthalmic solution Place 1 drop into both eyes 2 (two) times daily.   6  . esomeprazole (NEXIUM) 40 MG capsule Take 1 capsule (40 mg total) by mouth daily. 30 capsule 2  . ferrous sulfate 325  (65 FE) MG tablet daily.     Marland Kitchen gabapentin (NEURONTIN) 300 MG capsule Take 300 mg by mouth 2 (two) times daily.   3  . hydrochlorothiazide (HYDRODIURIL) 12.5 MG tablet   3  . HYDROcodone-acetaminophen (NORCO) 10-325 MG tablet Take 1 tablet by mouth every 6 (six) hours as needed. 20 tablet 0  . losartan (COZAAR) 100 MG tablet Take 100 mg by mouth daily.  4  . meclizine (ANTIVERT) 25 MG tablet Take 25 mg by mouth daily.    . Multiple Vitamins-Minerals (CENTRUM SILVER ADULT 50+ PO) Take 1 tablet by mouth daily.    . nitrofurantoin, macrocrystal-monohydrate, (MACROBID) 100 MG capsule Take 1 capsule (100 mg total) by mouth 2 (two) times daily. 10 capsule 0  . oxybutynin (DITROPAN-XL) 10 MG 24 hr tablet TK 1 T PO QD HS  2  . pravastatin (PRAVACHOL) 80 MG tablet Take 80 mg by mouth daily.   2  .  tamsulosin (FLOMAX) 0.4 MG CAPS capsule Take 0.4 mg by mouth at bedtime.     . brimonidine-timolol (COMBIGAN) 0.2-0.5 % ophthalmic solution Place 1 drop into both eyes 2 (two) times daily.     . clindamycin (CLEOCIN T) 1 % external solution daily.     . diclofenac sodium (VOLTAREN) 1 % GEL daily.     Marland Kitchen ketoconazole (NIZORAL) 2 % cream       No results found for this or any previous visit (from the past 48 hour(s)).  No results found.   A comprehensive review of systems was negative.  Blood pressure 118/68, pulse 82, temperature 98.3 F (36.8 C), temperature source Oral, resp. rate 18, height 6\' 2"  (1.88 m), weight 70.2 kg, SpO2 100 %.  General appearance: alert, cooperative and appears stated age Head: Normocephalic, without obvious abnormality, atraumatic Neck: supple, symmetrical, trachea midline Cardio: regular rate and rhythm Resp: clear to auscultation bilaterally Extremities: Intact sensation and capillary refill all digits.  +epl/fpl/io.  No wounds.  Pulses: 2+ and symmetric Skin: Skin color, texture, turgor normal. No rashes or lesions Neurologic: Grossly normal Incision/Wound:  none  Assessment/Plan Left carpal tunnel syndrome.  Non operative and operative treatment options have been discussed with the patient and patient wishes to proceed with operative treatment. Risks, benefits, and alternatives of surgery have been discussed and the patient agrees with the plan of care.   Leanora Cover 01/02/2019, 12:47 PM

## 2019-01-02 NOTE — Anesthesia Procedure Notes (Signed)
Anesthesia Regional Block: Bier block (IV Regional)   Pre-Anesthetic Checklist: ,, timeout performed, Correct Patient, Correct Site, Correct Laterality, Correct Procedure, Correct Position, site marked, Risks and benefits discussed,  Surgical consent,  Pre-op evaluation,  At surgeon's request and post-op pain management  Laterality: Left  Prep: alcohol swabs       Needles:  Injection technique: Single-shot      Needle Gauge: 22     Additional Needles:   Procedures:,,,,, intact distal pulses, Esmarch exsanguination, single tourniquet utilized, #20gu IV placed  Narrative:  Start time: 01/02/2019 1:25 PM End time: 01/02/2019 1:28 PM  Performed by: Personally  Anesthesiologist: Albertha Ghee, MD CRNA: Genelle Bal, CRNA

## 2019-01-02 NOTE — Op Note (Signed)
01/02/2019 [facility]                              OPERATIVE REPORT   PREOPERATIVE DIAGNOSIS:  Left carpal tunnel syndrome.  POSTOPERATIVE DIAGNOSIS:  Left carpal tunnel syndrome.  PROCEDURE:  Left carpal tunnel release.  SURGEON:  Leanora Cover, MD  ASSISTANT:  none.  ANESTHESIA: Bier block with sedation  IV FLUIDS:  Per anesthesia flow sheet.  ESTIMATED BLOOD LOSS:  Minimal.  COMPLICATIONS:  None.  SPECIMENS:  None.  TOURNIQUET TIME:    Total Tourniquet Time Documented: area (laterality) - 21 minutes Total: area (laterality) - 21 minutes   DISPOSITION:  Stable to PACU.  LOCATION: [facility]  INDICATIONS:  78 yo male with left carpal tunnel syndrome.  Positive nerve conduction studies.  Improved with injection.  He wishes to have a carpal tunnel release for management of his symptoms.  Risks, benefits and alternatives of surgery were discussed including the risk of blood loss; infection; damage to nerves, vessels, tendons, ligaments, bone; failure of surgery; need for additional surgery; complications with wound healing; continued pain; recurrence of carpal tunnel syndrome; and damage to motor branch. He voiced understanding of these risks and elected to proceed.   OPERATIVE COURSE:  After being identified preoperatively by myself, the patient and I agreed upon the procedure and site of procedure.  The surgical site was marked.  The risks, benefits, and alternatives of the surgery were reviewed and he wished to proceed.  Surgical consent had been signed.  He was given IV Ancef as preoperative antibiotic prophylaxis.  He was transferred to the operating room and placed on the operating room table in supine position with the Left upper extremity on an armboard.  Bier block anesthesia was induced by the anesthesiologist.  Left upper extremity was prepped and draped in normal sterile orthopaedic fashion.  A surgical pause was performed between the  surgeons, anesthesia, and operating room staff, and all were in agreement as to the patient, procedure, and site of procedure.  Tourniquet at the proximal aspect of the forearm had been inflated for the Bier block  Incision was made over the transverse carpal ligament and carried into the subcutaneous tissues by spreading technique.  Bipolar electrocautery was used to obtain hemostasis.  The palmar fascia was sharply incised.  The transverse carpal ligament was identified and sharply incised.  It was incised distally first.  Care was taken to ensure complete decompression distally.  It was then incised proximally.  Scissors were used to split the distal aspect of the volar antebrachial fascia.  A finger was placed into the wound to ensure complete decompression, which was the case.  The nerve was examined.  It was flattened and hyperemic. and It was adherent to the radial leaflet.  The motor branch was identified and was intact.  The wound was copiously irrigated with sterile saline.  It was then closed with 4-0 nylon in a horizontal mattress fashion.  It was injected with 0.25% plain Marcaine to aid in postoperative analgesia.  It was dressed with sterile Xeroform, 4x4s, an ABD, and wrapped with Kerlix and an Ace bandage.  Tourniquet was deflated at 21 minutes.  Fingertips were pink with brisk capillary refill after deflation of the tourniquet.  Operative drapes were broken down.  The patient was awoken from anesthesia safely.  He was transferred back to stretcher and taken to the PACU in stable condition.  I will see him back in the office in 1 week for postoperative followup.  I will give him a prescription for Norco 5/325 1-2 tabs PO q6 hours prn pain, dispense # 20.    Leanora Cover, MD Electronically signed, 01/02/19

## 2019-01-02 NOTE — Transfer of Care (Signed)
Immediate Anesthesia Transfer of Care Note  Patient: Russell [name]  Procedure(s) Performed: LEFT CARPAL TUNNEL RELEASE (Left Wrist)  Patient Location: PACU  Anesthesia Type:MAC and Bier block  Level of Consciousness: awake, alert  and oriented  Airway & Oxygen Therapy: Patient Spontanous Breathing and Patient connected to face mask oxygen  Post-op Assessment: Report given to RN and Post -op Vital signs reviewed and stable  Post vital signs: Reviewed and stable  Last Vitals:  Vitals Value Taken Time  BP    Temp    Pulse 78 01/02/2019  1:56 PM  Resp 18 01/02/2019  1:56 PM  SpO2 100 % 01/02/2019  1:56 PM  Vitals shown include unvalidated device data.  Last Pain:  Vitals:   01/02/19 1143  TempSrc: Oral  PainSc: 9       Patients Stated Pain Goal: 9 (20/60/15 6153)  Complications: No apparent anesthesia complications

## 2019-01-02 NOTE — Discharge Instructions (Addendum)

## 2019-01-03 ENCOUNTER — Encounter (HOSPITAL_BASED_OUTPATIENT_CLINIC_OR_DEPARTMENT_OTHER): Payer: Self-pay | Admitting: Orthopedic Surgery

## 2019-01-03 NOTE — Anesthesia Postprocedure Evaluation (Signed)
Anesthesia Post Note  Patient: Russell [name]  Procedure(s) Performed: LEFT CARPAL TUNNEL RELEASE (Left Wrist)     Patient location during evaluation: PACU Anesthesia Type: MAC and Bier Block Level of consciousness: awake and alert Pain management: pain level controlled Vital Signs Assessment: post-procedure vital signs reviewed and stable Respiratory status: spontaneous breathing, nonlabored ventilation, respiratory function stable and patient connected to nasal cannula oxygen Cardiovascular status: stable and blood pressure returned to baseline Postop Assessment: no apparent nausea or vomiting Anesthetic complications: no    Last Vitals:  Vitals:   01/02/19 1431 01/02/19 1445  BP: (!) 114/99 (!) 156/77  Pulse: 66 (!) 56  Resp: (!) 23 (!) 22  Temp:  36.7 C  SpO2: 99% 100%    Last Pain:  Vitals:   01/02/19 1445  TempSrc:   PainSc: [place]

## 2019-01-10 ENCOUNTER — Other Ambulatory Visit (INDEPENDENT_AMBULATORY_CARE_PROVIDER_SITE_OTHER): Payer: Medicare Other

## 2019-01-10 ENCOUNTER — Encounter: Payer: Self-pay | Admitting: Neurology

## 2019-01-10 ENCOUNTER — Ambulatory Visit (INDEPENDENT_AMBULATORY_CARE_PROVIDER_SITE_OTHER): Payer: Medicare Other | Admitting: Neurology

## 2019-01-10 VITALS — BP 130/70 | Resp 16 | Ht 74.0 in | Wt 157.0 lb

## 2019-01-10 DIAGNOSIS — G629 Polyneuropathy, unspecified: Secondary | ICD-10-CM

## 2019-01-10 DIAGNOSIS — G5732 Lesion of lateral popliteal nerve, left lower limb: Secondary | ICD-10-CM | POA: Diagnosis not present

## 2019-01-10 LAB — TSH: TSH: 1.18 u[IU]/mL (ref 0.35–4.50)

## 2019-01-10 LAB — C-REACTIVE PROTEIN: CRP: 0.2 mg/dL — ABNORMAL LOW (ref 0.5–20.0)

## 2019-01-10 LAB — SEDIMENTATION RATE: Sed Rate: 22 mm/hr — ABNORMAL HIGH (ref 0–20)

## 2019-01-10 LAB — FOLATE: Folate: >24 ng/mL (ref >5.9)

## 2019-01-10 NOTE — Progress Notes (Signed)
[facility] Clinic Note - Initial Visit   Date: 01/10/19  Russell [name] MRN: [id number] DOB: [birth year]   Dear Dr. Milinda Pointer:  Thank you for your kind referral of Russell [name] for consultation of left foot numbness. Although his history is well known to you, please allow [place] to reiterate it for the purpose of our medical record. The patient was accompanied to the clinic by self.    History of Present Illness: Russell [name] is a 78 y.o. right-handed African American male with COPD, CAD, BPH, hyperlipidemia, GERD, diabetes mellitus, history of seizure vs syncope, s/p left CTS surgery, s/p cervical surgery, tobacco abuse, and history of alcohol abuse (sober since 2003) presenting for evaluation of left leg numbness.    Starting around 2018, he began having numbness and tingling of the left anterior leg and over the top of the foot.  He also has some numbness and tingling of both feet, over the toes, but he does not have numbness extending into the right lower leg.  Symptoms are constant.  There are no exacerbating or alleviating factors.  He denies low back pain or radicular pain from his back.  He has noticed mild radiating pain down his left lateral leg into the foot.  Last year, he was having falls because of left leg weakness, stating that it would give out.  He admits to crossing his legs frequently and started to avoid doing this on with the left leg, because it would cause increased pain in the lower leg.   He has been sober since 2003.  He was previously heavily and daily for 25-30 years.   Out-side paper records, electronic medical record, and images have been reviewed where available and summarized as:  MRI lumbar spine 06/15/2014: 1. 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 2. Chronic severe left lateral recess stenosis at L5-S1 persists despite some regression of the associated chronic disc  herniation here. Chronic facet and ligament flavum hypertrophy is severe and progressed at this level. Mild spinal stenosis. Stable severe left and moderate right L5 foraminal stenosis. 3. Diverticulosis of the distal colon.  Lab Results  Component Value Date   HGBA1C 5.9 (H) 02/24/2015   Lab Results  Component Value Date   VITAMINB12 624 02/20/2010   Lab Results  Component Value Date   TSH 2.141 07/04/2010     Past Medical History:  Diagnosis Date  . AKI (acute kidney injury) ([place]) 06/23/2017  . Arthritis    SHOUDLERS, LUMBAR  . BPH with obstruction/lower urinary tract symptoms   . BPH with urinary obstruction 01/15/2016  . COPD (chronic obstructive pulmonary disease) ([place])   . Coronary atherosclerosis    denied knowledge of this 10/27/16  . Depression   . Diverticulosis of colon   . Dyspnea   . ED (erectile dysfunction)   . Full dentures   . GERD (gastroesophageal reflux disease)    not current  . Glaucoma   . History of gastritis   . History of scabies    2008  . 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 History of syncope    03-22-2014  DX  VAGAL RESPONSE  . Hyperlipidemia, mixed   . Hypertension   . Mitral regurgitation   . Peripheral arterial disease ([place])    one vessel runoff bilaterally via peroneal arteries  . Prostate cancer ([place])   . Seizures ([place]) 03/2014   ?  or syncope- patient refused to be admitted for further studies- "felt better"  . Type 2 diabetes, diet controlled ([place])    patient and his wife said no- have not been told 01/21/17  . Wears glasses     Past Surgical History:  Procedure Laterality Date  . ANTERIOR CERVICAL DECOMP/DISCECTOMY FUSION  10-16-2009   C4 -- C6  . ANTERIOR CERVICAL DECOMP/DISCECTOMY FUSION N/A 01/27/2017   Procedure: ANTERIOR CERVICAL DECOMPRESSION FUSION CERVICAL 3-4 WITH INSTRUMENTATION AND ALLOGRAFT;  Surgeon: Phylliss Bob, MD;  Location: [place];  Service: Orthopedics;  Laterality: N/A;  ANTERIOR  CERVICAL DECOMPRESSION FUSION CERVICAL 3-4 WITH INSTRUMENTATION AND ALLOGRAFT  . CAPSULOTOMY Right 01/26/2013   Procedure: MINOR CAPSULOTOMY;  Surgeon: Myrtha Mantis., MD;  Location: [place];  Service: Ophthalmology;  Laterality: Right;  . CARPAL TUNNEL RELEASE Left 01/02/2019   Procedure: LEFT CARPAL TUNNEL RELEASE;  Surgeon: Leanora Cover, MD;  Location: [place];  Service: Orthopedics;  Laterality: Left;  . CARPECTOMY Right 03/13/2014   Procedure: RIGHT  PROXIMAL ROW CARPECTOMY;  Surgeon: Tennis Must, MD;  Location: [place];  Service: Orthopedics;  Laterality: Right;  . CARPECTOMY Left 10/22/2015   Procedure: LEFT WRIST PROXIMAL ROW CARPECTOMY ;  Surgeon: Leanora Cover, MD;  Location: [place];  Service: Orthopedics;  Laterality: Left;  . CARPOMETACARPAL ([place]) FUSION OF THUMB Right 03/13/2014   Procedure: RIGHT FUSION OF THUMB CARPOMETACARPAL [facility]) JOINT;  Surgeon: Tennis Must, MD;  Location: [place];  Service: Orthopedics;  Laterality: Right;  . CATARACT EXTRACTION W/ INTRAOCULAR LENS  IMPLANT, BILATERAL  2009  . COLONOSCOPY  08-31-2007  . COLONOSCOPY    . CRYOABLATION N/A 01/14/2015   Procedure: CRYO ABLATION PROSTATE;  Surgeon: Ailene Rud, MD;  Location: [facility];  Service: Urology;  Laterality: N/A;  . ESOPHAGOGASTRODUODENOSCOPY  last one 09-11-2008  . EXCISION MASS LEFT FACE , SUBORBITAL AREA, PLASTER RECONSTRUCTION  02-09-2011  . EXCISIONAL DEBRIDEMENT AND REPAIR RIGHT QUADRICEP TENDON  07-05-2000  . FOOT SURGERY Left   . I&D EXTREMITY Right 05/24/2013   Procedure: RIGHT INDEX WOUND DEBRIDEMENT AND CLOSURE;  Surgeon: Jolyn Nap, MD;  Location: [place];  Service: Orthopedics;  Laterality: Right;  . INSERTION OF SUPRAPUBIC CATHETER N/A 01/14/2015   Procedure: SUPRAPUBIC TUBE PLACEMENT;  Surgeon: Ailene Rud, MD;  Location: [facility];  Service:  Urology;  Laterality: N/A;  . KNEE ARTHROSCOPY Right 2008  . LARYNGOSCOPY AND ESOPHAGOSCOPY  06-13-2010   MARSUPIALIZATION LEFT LARGE VALLECULAR CYST  . MASS EXCISION Left 10/22/2015   Procedure: EXCISION MASS OF LEFT INDEX FINGER;  Surgeon: Leanora Cover, MD;  Location: [place];  Service: Orthopedics;  Laterality: Left;  . ORCHIECTOMY Left 02/24/2015   Procedure: SCROTAL EXPLORATION WITH LEFT ORCHIECTOMY;  Surgeon: Kathie Rhodes, MD;  Location: [place];  Service: Urology;  Laterality: Left;  . POSTERIOR CERVICAL FUSION/FORAMINOTOMY N/A 01/28/2017   Procedure: POSTERIOR SPINAL FUSION CERVICAL 3-4, CERVICAL 4-5, CERVICAL 5-6, CERVICAL 6-7 WITH INSTRUMENATION AND ALLOGRAFT;  Surgeon: Phylliss Bob, MD;  Location: [place];  Service: Orthopedics;  Laterality: N/A;  POSTERIOR SPINAL FUSION CERVICAL 3-4, CERVICAL 4-5, CERVICAL 5-6, CERVICAL 6-7 WITH INSTRUMENATION AND ALLOGRAFT  . SHOULDER ARTHROSCOPY/ DEBRIDEMENT LABRAL TEAR/  BICEPS TENOTOMY Left 09-24-2011  . TONSILLECTOMY  as child  . TRANSTHORACIC ECHOCARDIOGRAM  02-21-2010   normal LVF/  ef 55-60%/ mild to moderate MR/  moderate LAE/  mild TR  .  YAG LASER APPLICATION Right 0/06/3709   Procedure: YAG LASER APPLICATION;  Surgeon: Myrtha Mantis., MD;  Location: [place];  Service: Ophthalmology;  Laterality: Right;  . YAG LASER CAPSULOTOMY, LEFT EYE  01-08-2011     Medications:  Outpatient Encounter Medications as of 01/10/2019  Medication Sig  . AZOPT 1 % ophthalmic suspension Place 1 drop into the left eye 3 (three) times daily.   . bimatoprost (LUMIGAN) 0.01 % SOLN Place 1 drop into both eyes at bedtime.  . brimonidine (ALPHAGAN) 0.2 % ophthalmic solution Place 1 drop into both eyes 2 (two) times daily.   . brimonidine-timolol (COMBIGAN) 0.2-0.5 % ophthalmic solution Place 1 drop into both eyes 2 (two) times daily.   . clindamycin (CLEOCIN T) 1 % external solution daily.   . diclofenac sodium (VOLTAREN) 1 % GEL daily.   Marland Kitchen  esomeprazole (NEXIUM) 40 MG capsule Take 1 capsule (40 mg total) by mouth daily.  . ferrous sulfate 325 (65 FE) MG tablet daily.   Marland Kitchen gabapentin (NEURONTIN) 300 MG capsule Take 300 mg by mouth 2 (two) times daily.   . hydrochlorothiazide (HYDRODIURIL) 12.5 MG tablet   . ketoconazole (NIZORAL) 2 % cream   . losartan (COZAAR) 100 MG tablet Take 100 mg by mouth daily.  . meclizine (ANTIVERT) 25 MG tablet Take 25 mg by mouth daily.  . Multiple Vitamins-Minerals (CENTRUM SILVER ADULT 50+ PO) Take 1 tablet by mouth daily.  . nitrofurantoin, macrocrystal-monohydrate, (MACROBID) 100 MG capsule Take 1 capsule (100 mg total) by mouth 2 (two) times daily.  Marland Kitchen oxybutynin (DITROPAN-XL) 10 MG 24 hr tablet TK 1 T PO QD HS  . pravastatin (PRAVACHOL) 80 MG tablet Take 80 mg by mouth daily.   . tamsulosin (FLOMAX) 0.4 MG CAPS capsule Take 0.4 mg by mouth at bedtime.   . [DISCONTINUED] HYDROcodone-acetaminophen (NORCO) 10-325 MG tablet Take 1 tablet by mouth every 6 (six) hours as needed.  . [DISCONTINUED] HYDROcodone-acetaminophen (NORCO) 5-325 MG tablet 1-2 tabs po q6 hours prn pain   No facility-administered encounter medications on file as of 01/10/2019.      Allergies: No Known Allergies  Family History: Family History  Problem Relation Age of Onset  . Unexplained death Mother        died at a young age, no known cause  . Heart attack Father        age unknown  . Diabetes Other   . Cancer Other     Social History: Social History   Tobacco Use  . Smoking status: Current Every Day Smoker    Packs/day: 1.00    Years: 59.00    Pack years: 59.00    Types: Cigarettes  . Smokeless tobacco: Never Used  . Tobacco comment: Started Chantix 10/26/16  Substance Use Topics  . Alcohol use: No    Comment: none since approx 2014- heavy before  . Drug use: No   Social History   Social History Narrative   Lives with wife and 2 of his children in a one story home.  Has 3 children.  Retired Recruitment consultant.  Education: high school.    Review of Systems:  CONSTITUTIONAL: No fevers, chills, night sweats, or weight loss.   EYES: No visual changes or eye pain ENT: No hearing changes.  No history of nose bleeds.   RESPIRATORY: No cough, wheezing and shortness of breath.   CARDIOVASCULAR: Negative for chest pain, and palpitations.   GI: Negative for abdominal discomfort, blood in stools or  black stools.  No recent change in bowel habits.   GU:  No history of incontinence.   MUSCLOSKELETAL: No history of joint pain or swelling.  No myalgias.   SKIN: Negative for lesions, rash, and itching.   HEMATOLOGY/ONCOLOGY: Negative for prolonged bleeding, bruising easily, and swollen nodes.  No history of cancer.   ENDOCRINE: Negative for cold or heat intolerance, polydipsia or goiter.   PSYCH:  No depression or anxiety symptoms.   NEURO: As Above.   Vital Signs:  BP 130/70   Resp 16   Ht 6' 2"  (1.88 m)   Wt 157 lb (71.2 kg)   BMI 20.16 kg/m    General Medical Exam:   General:  Thin appearing, comfortable.   Eyes/ENT: see cranial nerve examination.   Neck:  Full range of motion without tenderness.  No carotid bruits. Respiratory:  Clear to auscultation, good air entry bilaterally.   Cardiac:  Regular rate and rhythm, no murmur.   Extremities:  No deformities, edema, or skin discoloration.  Skin:  No rashes or lesions.  Neurological Exam: MENTAL STATUS including orientation to time, place, person, recent and remote memory, attention span and concentration, language, and fund of knowledge is normal.  Speech is not dysarthric.  CRANIAL NERVES: II:  No visual field defects.  Unremarkable fundi.   III-IV-VI: Pupils equal round and reactive to light.  Normal conjugate, extra-ocular eye movements in all directions of gaze.  No nystagmus.  Mild left ptosis (old).   V:  Normal facial sensation.   VII:  Normal facial symmetry and movements.  No pathologic facial reflexes.  VIII:  Normal hearing  and vestibular function.   IX-X:  Normal palatal movement.   XI:  Normal shoulder shrug and head rotation.   XII:  Normal tongue strength and range of motion, no deviation or fasciculation.  MOTOR:  Generalized loss of muscle bulk throughout.  No fasciculations or abnormal movements.  No pronator drift.  Tone is normal.    Right Upper Extremity:    Left Upper Extremity:    Deltoid  5/5   Deltoid  5/5   Biceps  5/5   Biceps  5/5   Triceps  5/5   Triceps  5/5   Wrist extensors  5/5   Wrist extensors  5/5   Wrist flexors  5/5   Wrist flexors  5/5   Finger extensors  5/5   Finger extensors  5/5   Finger flexors  5/5   Finger flexors  5/5   Dorsal interossei  5/5   Dorsal interossei  5/5   Abductor pollicis  5/5   Abductor pollicis  5/5   Tone (Ashworth scale)  0  Tone (Ashworth scale)  0   Right Lower Extremity:    Left Lower Extremity:    Hip flexors  5/5   Hip flexors  5/5   Hip extensors  5/5   Hip extensors  5/5   Knee flexors  5/5   Knee flexors  5/5   Knee extensors  5/5   Knee extensors  5/5   Dorsiflexors  5/5   Dorsiflexors  4/5   Plantarflexors  5/5   Plantarflexors  5/5   Inversion 5/5  Inversion 5/5  Eversion 5/5  Eversion 4/5  Toe extensors  5/5   Toe extensors  45   Toe flexors  5/5   Toe flexors  5/5   Tone (Ashworth scale)  0  Tone (Ashworth scale)  0   MSRs:  Right                                                                 Left brachioradialis 2+  brachioradialis 2+  biceps 2+  biceps 2+  triceps 2+  triceps 2+  patellar 2+  patellar 2+  ankle jerk 0  ankle jerk 0  Hoffman no  Hoffman no  plantar response down  plantar response down   SENSORY:  Vibration is absent distally at the toes bilaterally, absent at the left ankle and normal on the right ankle.  Temperature and pin prick is reduced over the toes bilaterally, as well as the left anterolateral leg and dorsum of the foot, sparing the first web space.  Sensation in the hands is  intact.  COORDINATION/GAIT: Normal finger-to- nose-finger.  Intact rapid alternating movements bilaterally. Gait is slow, slightly antalgic, without steppage or dragging of the left foot.   IMPRESSION: 1.  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 He has been sober since 2003.   PLAN/RECOMMENDATIONS:  NCS/EMG of the left > right leg Check ESR, CRP, TSH, vitamin B12, vitamin B1, folate, SPEP with IFE, copper Avoid crossing the legs to minimize compression of the nerve at the knee For painful paresthesias, ok to use OTC lidocaine ointment. He is already taking gabapentin 362m BID, which I do not recommend titrating due to renal dysfunction  Further recommendations pending results   Thank you for allowing me to participate in patient's care.  If I can answer any additional questions, I would be pleased to do so.    Sincerely,    Isabeau Mccalla K. PPosey Pronto DO

## 2019-01-10 NOTE — Patient Instructions (Addendum)
Nerve testing of the left > right legs  Check labs  Do not cross your legs  For your pain, you can try over the counter lidocaine ointment to apply on the leg    ELECTROMYOGRAM AND NERVE CONDUCTION STUDIES (EMG/NCS) INSTRUCTIONS  How to Prepare The neurologist conducting the EMG will need to know if you have certain medical conditions. Tell the neurologist and other EMG lab personnel if you: . Have a pacemaker or any other electrical medical device . Take blood-thinning medications . Have hemophilia, a blood-clotting disorder that causes prolonged bleeding Bathing Take a shower or bath shortly before your exam in order to remove oils from your skin. Don't apply lotions or creams before the exam.  What to Expect You'll likely be asked to change into a hospital gown for the procedure and lie down on an examination table. The following explanations can help you understand what will happen during the exam.  . Electrodes. The neurologist or a technician places surface electrodes at various locations on your skin depending on where you're experiencing symptoms. Or the neurologist may insert needle electrodes at different sites depending on your symptoms.  . Sensations. The electrodes will at times transmit a tiny electrical current that you may feel as a twinge or spasm. The needle electrode may cause discomfort or pain that usually ends shortly after the needle is removed. If you are concerned about discomfort or pain, you may want to talk to the neurologist about taking a short break during the exam.  . Instructions. During the needle EMG, the neurologist will assess whether there is any spontaneous electrical activity when the muscle is at rest - activity that isn't present in healthy muscle tissue - and the degree of activity when you slightly contract the muscle.  He or she will give you instructions on resting and contracting a muscle at appropriate times. Depending on what muscles and  nerves the neurologist is examining, he or she may ask you to change positions during the exam.  After your EMG You may experience some temporary, minor bruising where the needle electrode was inserted into your muscle. This bruising should fade within several days. If it persists, contact your primary care doctor.

## 2019-01-12 LAB — VITAMIN B12: Vitamin B-12: 663 pg/mL (ref 211–911)

## 2019-01-14 ENCOUNTER — Encounter (HOSPITAL_COMMUNITY): Payer: Self-pay | Admitting: Emergency Medicine

## 2019-01-14 ENCOUNTER — Emergency Department (HOSPITAL_COMMUNITY): Payer: Medicare Other

## 2019-01-14 ENCOUNTER — Emergency Department (HOSPITAL_COMMUNITY)
Admission: EM | Admit: 2019-01-14 | Discharge: 2019-01-14 | Discharge status: Against Medical Advice | Payer: Medicare Other | Attending: Emergency Medicine | Admitting: Emergency Medicine

## 2019-01-14 DIAGNOSIS — R079 Chest pain, unspecified: Secondary | ICD-10-CM

## 2019-01-14 DIAGNOSIS — E119 Type 2 diabetes mellitus without complications: Secondary | ICD-10-CM | POA: Diagnosis not present

## 2019-01-14 DIAGNOSIS — Z79899 Other long term (current) drug therapy: Secondary | ICD-10-CM | POA: Insufficient documentation

## 2019-01-14 DIAGNOSIS — F17210 Nicotine dependence, cigarettes, uncomplicated: Secondary | ICD-10-CM | POA: Diagnosis not present

## 2019-01-14 DIAGNOSIS — J449 Chronic obstructive pulmonary disease, unspecified: Secondary | ICD-10-CM | POA: Diagnosis not present

## 2019-01-14 DIAGNOSIS — I2510 Atherosclerotic heart disease of native coronary artery without angina pectoris: Secondary | ICD-10-CM | POA: Diagnosis not present

## 2019-01-14 DIAGNOSIS — F329 Major depressive disorder, single episode, unspecified: Secondary | ICD-10-CM | POA: Insufficient documentation

## 2019-01-14 DIAGNOSIS — Z8546 Personal history of malignant neoplasm of prostate: Secondary | ICD-10-CM | POA: Diagnosis not present

## 2019-01-14 DIAGNOSIS — I10 Essential (primary) hypertension: Secondary | ICD-10-CM | POA: Insufficient documentation

## 2019-01-14 LAB — PROTEIN ELECTROPHORESIS, SERUM
ALPHA 1: 0.3 g/dL (ref 0.2–0.3)
Albumin ELP: 4.2 g/dL (ref 3.8–4.8)
Alpha 2: 0.8 g/dL (ref 0.5–0.9)
Beta 2: 0.4 g/dL (ref 0.2–0.5)
Beta Globulin: 0.5 g/dL (ref 0.4–0.6)
Gamma Globulin: 0.9 g/dL (ref 0.8–1.7)
Total Protein: 7.1 g/dL (ref 6.1–8.1)

## 2019-01-14 LAB — BASIC METABOLIC PANEL
Anion gap: 14 (ref 5–15)
BUN: 19 mg/dL (ref 8–23)
CO2: 21 mmol/L — ABNORMAL LOW (ref 22–32)
Calcium: 9.5 mg/dL (ref 8.9–10.3)
Chloride: 103 mmol/L (ref 98–111)
Creatinine, Ser: 1.3 mg/dL — ABNORMAL HIGH (ref 0.61–1.24)
GFR calc Af Amer: >60 mL/min (ref >60)
GFR calc non Af Amer: 52 mL/min — ABNORMAL LOW (ref >60)
Glucose, Bld: 114 mg/dL — ABNORMAL HIGH (ref 70–99)
Potassium: 3.7 mmol/L (ref 3.5–5.1)
Sodium: 138 mmol/L (ref 135–145)

## 2019-01-14 LAB — CBC
HCT: 34.9 % — ABNORMAL LOW (ref 39.0–52.0)
Hemoglobin: 10.4 g/dL — ABNORMAL LOW (ref 13.0–17.0)
MCH: 24 pg — ABNORMAL LOW (ref 26.0–34.0)
MCHC: 29.8 g/dL — ABNORMAL LOW (ref 30.0–36.0)
MCV: 80.6 fL (ref 80.0–100.0)
Platelets: 331 10*3/uL (ref 150–400)
RBC: 4.33 MIL/uL (ref 4.22–5.81)
RDW: 17.2 % — ABNORMAL HIGH (ref 11.5–15.5)
WBC: 9.2 10*3/uL (ref 4.0–10.5)
nRBC: 0 % (ref 0.0–0.2)

## 2019-01-14 LAB — VITAMIN B1: Vitamin B1 (Thiamine): 11 nmol/L (ref 8–30)

## 2019-01-14 LAB — I-STAT TROPONIN, ED: Troponin i, poc: 0.01 ng/mL (ref 0.00–0.08)

## 2019-01-14 LAB — IMMUNOFIXATION ELECTROPHORESIS
IgG (Immunoglobin G), Serum: 980 mg/dL (ref 600–1540)
IgM, Serum: 46 mg/dL — ABNORMAL LOW (ref 50–300)
Immunofix Electr Int: NOT DETECTED
Immunoglobulin A: 276 mg/dL (ref 70–320)

## 2019-01-14 LAB — COPPER, SERUM: Copper: 149 ug/dL (ref 70–175)

## 2019-01-14 NOTE — ED Notes (Signed)
Patient signed AMA - declined wheelchair - RN walked patient to lobby where family will pick him up. Ambulatory with steady gait, VS stable.

## 2019-01-14 NOTE — ED Provider Notes (Signed)
[facility] EMERGENCY DEPARTMENT Provider Note   CSN: [id number] Arrival date & time: 01/14/19  1239     History   Chief Complaint Chief Complaint  Patient presents with  . Chest Pain    HPI Russell [name] is a 78 y.o. male with history of AKI, BPH, COPD, GERD, diverticulosis, type 2 diabetes, hypertension, hyperlipidemia, PAD, mitral regurgitation presents for evaluation of acute onset, resolved left-sided chest pain beginning approximately an hour prior to arrival.  He reports that he was playing solitaire when he began to feel left-sided chest pain with associated nausea, diaphoresis, and lightheadedness.  He denies shortness of breath, vomiting, syncope, abdominal pain, or fevers.  He reports his symptoms were constant, unsure of aggravating or alleviating factors.  Pain did not radiate. It resolved spontaneously while he was with EMS.  He did receive 324 mg of aspirin and 200 cc normal saline as his initial blood pressure was 90/palp.  He does report that he is experience similar pains before but cannot tell me when.  He is a poor historian.  He cannot describe the quality of the pain.He is a current smoker, denies recreational drug use or alcohol intake. He denies chest pain presently.   The history is provided by the patient.    Past Medical History:  Diagnosis Date  . AKI (acute kidney injury) ([place]) 06/23/2017  . Arthritis    SHOUDLERS, LUMBAR  . BPH with obstruction/lower urinary tract symptoms   . BPH with urinary obstruction 01/15/2016  . COPD (chronic obstructive pulmonary disease) ([place])   . Coronary atherosclerosis    denied knowledge of this 10/27/16  . Depression   . Diverticulosis of colon   . Dyspnea   . ED (erectile dysfunction)   . Full dentures   . GERD (gastroesophageal reflux disease)    not current  . Glaucoma   . History of gastritis   . History of scabies    2008  . 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  History of syncope    03-22-2014  DX  VAGAL RESPONSE  . Hyperlipidemia, mixed   . Hypertension   . Mitral regurgitation   . Peripheral arterial disease ([place])    one vessel runoff bilaterally via peroneal arteries  . Prostate cancer ([place])   . Seizures ([place]) 03/2014   ? or syncope- patient refused to be admitted for further studies- "felt better"  . Type 2 diabetes, diet controlled ([place])    patient and his wife said no- have not been told 01/21/17  . Wears glasses     Patient Active Problem List   Diagnosis Date Noted  . Osteoarthritis 05/31/2018  . Foot pain, left 05/11/2018  . Chest pain 06/23/2017  . AKI (acute kidney injury) ([place]) 06/23/2017  . Acute respiratory insufficiency   . Surgery, elective   . Radiculopathy 01/27/2017  . Cervical radiculopathy 01/27/2017  . 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  . 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 Peripheral arterial disease ([place]) 01/23/2016  . SLAC (scapholunate advanced collapse) of wrist 11/13/2015  . Fever 02/22/2015  . Epididymitis, left 02/22/2015  . Cellulitis 02/22/2015  . Orchitis 02/22/2015  . Testicular abscess 02/22/2015  . DM type 2 (diabetes mellitus, type 2) ([place]) 02/22/2015  . Microcytic anemia 02/22/2015  . Epididymitis 02/22/2015  . UTI (lower urinary tract infection) 02/22/2015  . Hypotension 03/22/2014  . Neuroma digital nerve 09/28/2013  .  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 Dehydration 07/03/2010  . NAUSEA AND VOMITING 07/03/2010  . BENIGN PROSTATIC HYPERTROPHY, WITH URINARY OBSTRUCTION 06/13/2010  . ARM PAIN 06/03/2010  . DYSPNEA ON EXERTION 05/15/2010  . TOBACCO ABUSE 04/03/2010  . COPD 04/03/2010  . G E R D 04/03/2010  . OTHER DYSPHAGIA 04/03/2010  . PNEUMONIA, RIGHT LOWER LOBE 02/19/2010  . DIABETES MELLITUS, TYPE II, CONTROLLED 01/14/2010  . LUMBAR RADICULOPATHY, LEFT 05/16/2009  . DYSPHAGIA UNSPECIFIED 05/16/2009  . COUGH DUE TO ACE  INHIBITORS 04/02/2009  . DIZZINESS 07/27/2008  . HYPERCHOLESTEROLEMIA, MIXED 05/11/2008  . CATARACTS, BILATERAL 03/01/2008  . MILD SPINAL STENOSIS, CERVICAL 01/12/2008  . LEG CRAMPS 01/12/2008  . EARLY SATIETY 11/04/2007  . DIVERTICULOSIS, COLON 08/31/2007  . SCABIES 08/26/2007  . ERECTILE DYSFUNCTION 08/18/2007  . ABUSE, ALCOHOL SUSPECTED 08/18/2007  . Essential hypertension 08/17/2007  . HEMOCCULT POSITIVE STOOL 07/05/2007  . ANEMIA, IRON DEFICIENCY NOS 05/31/2007  . WEIGHT LOSS, ABNORMAL 05/10/2006    Past Surgical History:  Procedure Laterality Date  . ANTERIOR CERVICAL DECOMP/DISCECTOMY FUSION  10-16-2009   C4 -- C6  . ANTERIOR CERVICAL DECOMP/DISCECTOMY FUSION N/A 01/27/2017   Procedure: ANTERIOR CERVICAL DECOMPRESSION FUSION CERVICAL 3-4 WITH INSTRUMENTATION AND ALLOGRAFT;  Surgeon: Phylliss Bob, MD;  Location: [facility];  Service: Orthopedics;  Laterality: N/A;  ANTERIOR CERVICAL DECOMPRESSION FUSION CERVICAL 3-4 WITH INSTRUMENTATION AND ALLOGRAFT  . CAPSULOTOMY Right 01/26/2013   Procedure: MINOR CAPSULOTOMY;  Surgeon: Myrtha Mantis., MD;  Location: [facility];  Service: Ophthalmology;  Laterality: Right;  . CARPAL TUNNEL RELEASE Left 01/02/2019   Procedure: LEFT CARPAL TUNNEL RELEASE;  Surgeon: Leanora Cover, MD;  Location: [place] [place];  Service: Orthopedics;  Laterality: Left;  . CARPECTOMY Right 03/13/2014   Procedure: RIGHT  PROXIMAL ROW CARPECTOMY;  Surgeon: Tennis Must, MD;  Location: [facility];  Service: Orthopedics;  Laterality: Right;  . CARPECTOMY Left 10/22/2015   Procedure: LEFT WRIST PROXIMAL ROW CARPECTOMY ;  Surgeon: Leanora Cover, MD;  Location: [facility];  Service: Orthopedics;  Laterality: Left;  . CARPOMETACARPAL ([place]) FUSION OF THUMB Right 03/13/2014   Procedure: RIGHT FUSION OF THUMB CARPOMETACARPAL [facility]) JOINT;  Surgeon: Tennis Must, MD;  Location: [place];  Service: Orthopedics;  Laterality:  Right;  . CATARACT EXTRACTION W/ INTRAOCULAR LENS  IMPLANT, BILATERAL  2009  . COLONOSCOPY  08-31-2007  . COLONOSCOPY    . CRYOABLATION N/A 01/14/2015   Procedure: CRYO ABLATION PROSTATE;  Surgeon: Ailene Rud, MD;  Location: [facility];  Service: Urology;  Laterality: N/A;  . ESOPHAGOGASTRODUODENOSCOPY  last one 09-11-2008  . EXCISION MASS LEFT FACE , SUBORBITAL AREA, PLASTER RECONSTRUCTION  02-09-2011  . EXCISIONAL DEBRIDEMENT AND REPAIR RIGHT QUADRICEP TENDON  07-05-2000  . FOOT SURGERY Left   . I&D EXTREMITY Right 05/24/2013   Procedure: RIGHT INDEX WOUND DEBRIDEMENT AND CLOSURE;  Surgeon: Jolyn Nap, MD;  Location: [place];  Service: Orthopedics;  Laterality: Right;  . INSERTION OF SUPRAPUBIC CATHETER N/A 01/14/2015   Procedure: SUPRAPUBIC TUBE PLACEMENT;  Surgeon: Ailene Rud, MD;  Location: [facility];  Service: Urology;  Laterality: N/A;  . KNEE ARTHROSCOPY Right 2008  . LARYNGOSCOPY AND ESOPHAGOSCOPY  06-13-2010   MARSUPIALIZATION LEFT LARGE VALLECULAR CYST  . MASS EXCISION Left 10/22/2015   Procedure: EXCISION MASS OF LEFT INDEX FINGER;  Surgeon: Leanora Cover, MD;  Location: [place];  Service: Orthopedics;  Laterality: Left;  . ORCHIECTOMY Left 02/24/2015   Procedure: SCROTAL EXPLORATION WITH LEFT ORCHIECTOMY;  Surgeon: Kathie Rhodes, MD;  Location: [facility];  Service: Urology;  Laterality: Left;  . POSTERIOR CERVICAL FUSION/FORAMINOTOMY N/A 01/28/2017   Procedure: POSTERIOR SPINAL FUSION CERVICAL 3-4, CERVICAL 4-5, CERVICAL 5-6, CERVICAL 6-7 WITH INSTRUMENATION AND ALLOGRAFT;  Surgeon: Phylliss Bob, MD;  Location: [facility];  Service: Orthopedics;  Laterality: N/A;  POSTERIOR SPINAL FUSION CERVICAL 3-4, CERVICAL 4-5, CERVICAL 5-6, CERVICAL 6-7 WITH INSTRUMENATION AND ALLOGRAFT  . SHOULDER ARTHROSCOPY/ DEBRIDEMENT LABRAL TEAR/  BICEPS TENOTOMY Left 09-24-2011  . TONSILLECTOMY  as child  . TRANSTHORACIC  ECHOCARDIOGRAM  02-21-2010   normal LVF/  ef 55-60%/ mild to moderate MR/  moderate LAE/  mild TR  . YAG LASER APPLICATION Right 0/08/3234   Procedure: YAG LASER APPLICATION;  Surgeon: Myrtha Mantis., MD;  Location: [place];  Service: Ophthalmology;  Laterality: Right;  . YAG LASER CAPSULOTOMY, LEFT EYE  01-08-2011        Home Medications    Prior to Admission medications   Medication Sig Start Date End Date Taking? Authorizing Provider  AZOPT 1 % ophthalmic suspension Place 1 drop into the left eye 3 (three) times daily.  12/25/16  Yes [provider]  bimatoprost (LUMIGAN) 0.01 % SOLN Place 1 drop into both eyes at bedtime. 04/10/13  Yes Dhungel, Nishant, MD  brimonidine-timolol (COMBIGAN) 0.2-0.5 % ophthalmic solution Place 1 drop into both eyes 2 (two) times daily.    Yes [provider]  diclofenac sodium (VOLTAREN) 1 % GEL Apply 2 g topically daily.  03/07/18  Yes [provider]  esomeprazole (NEXIUM) 40 MG capsule Take 1 capsule (40 mg total) by mouth daily. Patient taking differently: Take 40 mg by mouth as needed.  07/16/17  Yes Isaiah Serge, NP  ferrous sulfate 325 (65 FE) MG tablet Take 325 mg by mouth daily.    Yes [provider]  gabapentin (NEURONTIN) 300 MG capsule Take 300 mg by mouth daily.  10/19/16  Yes [provider]  hydrochlorothiazide (HYDRODIURIL) 12.5 MG tablet Take 12.5 mg by mouth daily.  07/28/18  Yes [provider]  HYDROcodone-acetaminophen (NORCO/VICODIN) 5-325 MG tablet Take 1 tablet by mouth every 6 (six) hours as needed. 01/11/19  Yes [provider]  losartan (COZAAR) 100 MG tablet Take 100 mg by mouth daily. 06/08/17  Yes [provider]  meclizine (ANTIVERT) 25 MG tablet Take 25 mg by mouth daily. 06/16/17  Yes [provider]  Multiple Vitamins-Minerals (CENTRUM SILVER ADULT 50+ PO) Take 1 tablet by mouth daily.   Yes [provider]  Omega-3 Fatty Acids (FISH  OIL) 1000 MG CPDR Take 1,000 mg by mouth daily.   Yes [provider]  oxybutynin (DITROPAN-XL) 10 MG 24 hr tablet Take 10 mg by mouth at bedtime.  04/26/18  Yes [provider]  pravastatin (PRAVACHOL) 80 MG tablet Take 80 mg by mouth daily.  10/19/16  Yes [provider]  tamsulosin (FLOMAX) 0.4 MG CAPS capsule Take 0.4 mg by mouth at bedtime.  12/03/16  Yes [provider]  nitrofurantoin, macrocrystal-monohydrate, (MACROBID) 100 MG capsule Take 1 capsule (100 mg total) by mouth 2 (two) times daily. Patient not taking: Reported on 01/14/2019 08/23/18   Valarie Merino, MD    Family History Family History  Problem Relation Age of Onset  . Unexplained death Mother        died at a young age, no known  cause  . Heart attack Father        age unknown  . Diabetes Other   . Cancer Other     Social History Social History   Tobacco Use  . Smoking status: Current Every Day Smoker    Packs/day: 1.00    Years: 59.00    Pack years: 59.00    Types: Cigarettes  . Smokeless tobacco: Never Used  . Tobacco comment: Started Chantix 10/26/16  Substance Use Topics  . Alcohol use: No    Comment: none since approx 2014- heavy before  . Drug use: No     Allergies   Patient has no known allergies.   Review of Systems Review of Systems  Constitutional: Positive for diaphoresis. Negative for chills and fever.  Respiratory: Negative for shortness of breath.   Cardiovascular: Positive for chest pain. Negative for leg swelling.  Gastrointestinal: Positive for nausea. Negative for abdominal pain and vomiting.  Neurological: Positive for light-headedness. Negative for syncope.  All other systems reviewed and are negative.    Physical Exam Updated Vital Signs BP 135/68 (BP Location: Right Arm)   Pulse (!) 55   Temp 98.2 F (36.8 C) (Oral)   Resp 18   Ht 6\' 2"  (1.88 m)   Wt 71.2 kg   SpO2 95%   BMI 20.16 kg/m   Physical Exam Vitals signs and nursing  note reviewed.  Constitutional:      General: He is not in acute distress.    Appearance: He is well-developed.  HENT:     Head: Normocephalic and atraumatic.  Eyes:     General:        Right eye: No discharge.        Left eye: No discharge.     Conjunctiva/sclera: Conjunctivae normal.  Neck:     Vascular: No JVD.     Trachea: No tracheal deviation.  Cardiovascular:     Rate and Rhythm: Bradycardia present.     Pulses:          Radial pulses are 2+ on the right side and 2+ on the left side.       Dorsalis pedis pulses are 2+ on the right side and 2+ on the left side.       Posterior tibial pulses are 2+ on the right side and 2+ on the left side.     Heart sounds: Normal heart sounds.     Comments: Homans sign absent bilaterally, no lower extremity edema, no palpable cords, compartments are soft  Pulmonary:     Effort: Pulmonary effort is normal.  Chest:     Chest wall: No tenderness.  Abdominal:     General: There is no distension.     Palpations: Abdomen is soft. There is no mass.     Tenderness: There is no abdominal tenderness. There is no guarding or rebound.  Musculoskeletal:     Right lower leg: He exhibits no tenderness. No edema.     Left lower leg: He exhibits no tenderness. No edema.  Skin:    General: Skin is warm and dry.     Findings: No erythema.  Neurological:     Mental Status: He is alert.  Psychiatric:        Behavior: Behavior normal.      ED Treatments / Results  Labs (all labs ordered are listed, but only abnormal results are displayed) Labs Reviewed  BASIC METABOLIC PANEL - Abnormal; Notable for the following components:  Result Value   CO2 21 (*)    Glucose, Bld 114 (*)    Creatinine, Ser 1.30 (*)    GFR calc non Af Amer 52 (*)    All other components within normal limits  CBC - Abnormal; Notable for the following components:   Hemoglobin 10.4 (*)    HCT 34.9 (*)    MCH 24.0 (*)    MCHC 29.8 (*)    RDW 17.2 (*)    All other  components within normal limits  I-STAT TROPONIN, ED    EKG EKG Interpretation  Date/Time:  Saturday January 14 2019 12:47:54 EST Ventricular Rate:  51 PR Interval:    QRS Duration: 118 QT Interval:  526 QTC Calculation: 485 R Axis:   75 Text Interpretation:  Sinus rhythm LVH with secondary repolarization abnormality Borderline prolonged QT interval Confirmed by Lennice Sites [phone]) on 01/14/2019 2:47:40 PM   Radiology Dg Chest 2 View  Result Date: 01/14/2019 CLINICAL DATA:  Severe left-sided chest pain, shortness breath, nausea, and dizziness. COPD. EXAM: CHEST - 2 VIEW COMPARISON:  08/12/2018 FINDINGS: The heart size and mediastinal contours are within normal limits. Aortic atherosclerosis. Both lungs are clear. Cervical spine fusion hardware again noted. IMPRESSION: No active cardiopulmonary disease. Electronically Signed   By: Earle Gell M.D.   On: 01/14/2019 13:58    Procedures Procedures (including critical care time)  Medications Ordered in ED Medications - No data to display   Initial Impression / Assessment and Plan / ED Course  I have reviewed the triage vital signs and the nursing notes.  Pertinent labs & imaging results that were available during my care of the patient were reviewed by me and considered in my medical decision making (see chart for details).     Patient presenting for evaluation of acute onset, resolved left-sided chest pains.  He is afebrile, persistently bradycardic while in the ED but vital signs otherwise stable.  He is nontoxic in appearance.  He is completely asymptomatic on my assessment.  He appears irate when being asked history taking questions.  His EKG reviewed by me shows sinus bradycardia.  Chest x-ray shows no acute cardiopulmonary abnormalities.  Remainder of lab work reviewed by me shows mildly elevated creatinine, mild anemia, no metabolic derangements or leukocytosis.  His initial troponin is negative.  Low suspicion of PE,  dissection, cardiac tamponade, pneumothorax, or esophageal rupture. However, given his significant risk factors and concerning story, I would recommend admission for serial troponins and further evaluation of his chest pain.  2:28 PM Patient wants to leave against medical advice.  He does not wish to stay any longer and reports "since I am not having any pain, I do not have to stay any longer.  All you are going to do is take more blood ". Patient understands that his actions will lead to inadequate medical workup, and that he  is at risk of complications of missed diagnosis, which includes morbidity and mortality.  Alternative options discussed.  Opportunity to change mind given.  Discussion witnessed by RN Tonita Cong Patient is A&O x4 demonstrating good capacity to make decision. Patient understands that he needs to return to the ER immediately if his symptoms get worse.  Final Clinical Impressions(s) / ED Diagnoses   Final diagnoses:  Left-sided chest pain    ED Discharge Orders    None       Renita Papa, PA-C 01/14/19 1622    Lennice Sites, DO 01/14/19 1642

## 2019-01-14 NOTE — ED Provider Notes (Signed)
Medical screening examination/treatment/procedure(s) were conducted as a shared visit with non-physician practitioner(s) and myself.  I personally evaluated the patient during the encounter. Briefly, the patient is a 78 y.o. male with history of hypertension, high cholesterol who presents the ED with chest pain.  Patient with unremarkable vitals.  No fever.  EKG shows sinus bradycardia.  No ischemic changes.  Patient had chest pain while playing cards just prior to arrival.  States that his pain is now resolved.  Denies any shortness of breath.  No cough, no sputum production.  Clear breath sounds bilaterally.  Patient appears neuro intact.  Lab work already done prior to my evaluation that showed no significant anemia, electrolyte abnormality, kidney injury.  Chest x-ray showed no signs of pneumonia, pneumothorax, pleural effusion.  Patient with troponin within normal limits.  Patient with multiple cardiac risk factors.  Had chest pain at rest.  Concerning for possible ACS.  Patient was offered admission for serial troponins and further cardiac work-up.  However he wants to leave [place].  I urged the patient to stay for at least 1 more troponin but he still did not want to stay.  He has capacity to make this decision.  I had a discussion with him about the risks and benefits of leaving versus going and patient was okay leaving [name].  He understands that he may have a heart attack and have something such as death or permanent disability occur.  There is no concern for blood clot at this time.  Patient was hemodynamically stable throughout my care.  Left AMA.  This chart was dictated using voice recognition software.  Despite best efforts to proofread,  errors can occur which can change the documentation meaning.    EKG Interpretation  Date/Time:  Saturday January 14 2019 12:47:54 EST Ventricular Rate:  51 PR Interval:    QRS Duration: 118 QT Interval:  526 QTC  Calculation: 485 R Axis:   75 Text Interpretation:  Sinus rhythm LVH with secondary repolarization abnormality Borderline prolonged QT interval Confirmed by Lennice Sites [id number]) on 01/14/2019 2:47:40 PM           Lennice Sites, DO 01/14/19 1449

## 2019-01-14 NOTE — Discharge Instructions (Addendum)
You are leaving the hospital [facility].  Please return to the emergency department immediately if any concerning signs or symptoms develop such as return of your chest pain, shortness of breath, passing out, persistent vomiting, or high fevers.  If nothing else, please follow-up with your primary care physician or cardiologist for reevaluation of your symptoms.

## 2019-01-14 NOTE — ED Triage Notes (Signed)
Per [facility]: Patient to ED c/o sudden onset severe L sided chest pain accompanied by shortness of breath, dizziness, and nausea. Patient diaphoretic on EMS arrival - initial BP 90 palp - increased to 110/70 after 200cc NS. HR 44-62 sinus brady. CP resolved on its own, prior to being given 324 ASA by EMS. History of angina, states this pain was similar to pain he's had in the past. Denies pain at this time. A&O x 4. Just endorsing generalized weakness.

## 2019-01-14 NOTE — ED Notes (Signed)
Patient wanting to leave - EDP at bedside discussing risks of leaving against medical advice. Patient states, "I'm ready to go because everything is fine." Patient is alert and oriented and he verbalized understanding of risks of leaving AMA. He states he will come back if he feels worse.

## 2019-01-17 ENCOUNTER — Telehealth: Payer: Self-pay | Admitting: *Deleted

## 2019-01-17 ENCOUNTER — Ambulatory Visit (INDEPENDENT_AMBULATORY_CARE_PROVIDER_SITE_OTHER): Payer: Medicare Other | Admitting: Neurology

## 2019-01-17 DIAGNOSIS — G5732 Lesion of lateral popliteal nerve, left lower limb: Secondary | ICD-10-CM

## 2019-01-17 DIAGNOSIS — G629 Polyneuropathy, unspecified: Secondary | ICD-10-CM

## 2019-01-17 NOTE — Telephone Encounter (Signed)
-----   Message from Alda Berthold, DO sent at 01/16/2019  7:55 AM EST ----- Please notify patient lab are within normal limits.  Thank you.

## 2019-01-17 NOTE — Procedures (Signed)
[facility] Neurology  [place], [place]  [place], [place] [address] Tel: [phone] Fax:  [phone] Test Date:  01/17/2019  Patient: Russell [name] DOB: 01-20-1941 Physician: Narda Amber, DO  Sex: Male Height: 6\' 2"  Ref Phys: Narda Amber, DO  ID#: [id number] Temp: 32.0C Technician:    Patient Complaints: This is a 78 year old man referred for evaluation of bilateral feet numbness and tingling, which is worse on the left and extends into the lower leg.    NCV & EMG Findings: Extensive electrodiagnostic testing of the left lower extremity and additional studies of the right shows:  1. Bilateral sural and superficial peroneal sensory responses are absent. 2. Left peroneal motor response shows reduced amplitude at the extensor digitorum brevis and tibialis anterior.  Right peroneal and bilateral tibial motor responses are within normal limits. 3. Bilateral tibial H reflex studies show prolonged latencies. 4. Chronic motor axonal loss changes are seen affecting the left tibialis anterior, extensor hallucis longus, fibularis longus, and bilateral flexor digitorum longus muscles.  There is no evidence of accompanied active denervation.  Impression: 1. 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 2. There is evidence of a superimposed left common peroneal mononeuropathy at or above the take off to the tibialis anterior muscle, which is mild-moderate in degree electrically.   ___________________________ Narda Amber, DO    Nerve Conduction Studies Anti Sensory Summary Table   Site NR Peak (ms) Norm Peak (ms) P-T Amp (V) Norm P-T Amp  Left Sup Peroneal Anti Sensory (Ant Lat Mall)  32C  12 cm NR  <4.6  >3  Right Sup Peroneal Anti Sensory (Ant Lat Mall)  32C  12 cm NR  <4.6  >3  Left Sural Anti Sensory (Lat Mall)  32C  Calf NR  <4.6  >3  Right Sural Anti Sensory (Lat Mall)  32C    Calf NR  <4.6  >3   Motor Summary Table   Site NR Onset (ms) Norm Onset (ms) O-P Amp (mV) Norm O-P Amp Site1 Site2 Delta-0 (ms) Dist (cm) Vel (m/s) Norm Vel (m/s)  Left Peroneal Motor (Ext Dig Brev)  32C  Ankle    5.5 <6.0 2.0 >2.5 B Fib Ankle 9.3 40.0 43 >40  B Fib    14.8  1.8  Poplt B Fib 1.9 9.0 47 >40  Poplt    16.7  1.8         Right Peroneal Motor (Ext Dig Brev)  32C  Ankle    4.1 <6.0 3.9 >2.5 B Fib Ankle 10.8 43.0 40 >40  B Fib    14.9  3.7  Poplt B Fib 1.9 9.0 47 >40  Poplt    16.8  3.6         Left Peroneal TA Motor (Tib Ant)  32C  Fib Head    4.1 <4.5 2.8 >3 Poplit Fib Head 1.8 9.0 50 >40  Poplit    5.9  2.7         Right Peroneal TA Motor (Tib Ant)  32C  Fib Head    4.5 <4.5 3.6 >3 Poplit Fib Head 1.6 9.0 56 >40  Poplit    6.1  3.6         Left Tibial Motor (Abd Hall Brev)  32C  Ankle    4.2 <6.0 5.0 >4 Knee Ankle 11.0 48.0 44 >40  Knee    15.2  4.3  Right Tibial Motor (Abd Hall Brev)  32C  Ankle    4.8 <6.0 7.2 >4 Knee Ankle 12.1 48.0 40 >40  Knee    16.9  4.7          H Reflex Studies   NR H-Lat (ms) Lat Norm (ms) L-R H-Lat (ms)  Left Tibial (Gastroc)  32C     38.23 <35   Right Tibial (Gastroc)  32C     38.23 <35 0.00   EMG   Side Muscle Ins Act Fibs Psw Fasc Number Recrt Dur Dur. Amp Amp. Poly Poly. Comment  Left AntTibialis Nml Nml Nml Nml 2- Rapid Some 1+ Some 1+ Some 1+ N/A  Left Gastroc Nml Nml Nml Nml Nml Nml Nml Nml Nml Nml Nml Nml N/A  Left Flex Dig Long Nml Nml Nml Nml 1- Rapid Some 1+ Some 1+ Nml Nml N/A  Left RectFemoris Nml Nml Nml Nml Nml Nml Nml Nml Nml Nml Nml Nml N/A  Left GluteusMed Nml Nml Nml Nml Nml Nml Nml Nml Nml Nml Nml Nml N/A  Left ExtHallLong Nml Nml Nml Nml 2- Rapid Some 1+ Some 1+ Nml Nml N/A  Left Fibularis Long Nml Nml Nml Nml 1- Rapid Some 1+ Some 1+ Some 1+ N/A  Left BicepsFemS Nml Nml Nml Nml Nml Nml Nml Nml Nml Nml Nml Nml N/A  Right AntTibialis Nml Nml Nml Nml Nml Nml Nml Nml Nml Nml Nml Nml N/A  Right  Fibularis Long Nml Nml Nml Nml 1- Rapid Few 1+ Few 1+ Nml Nml N/A  Right Gastroc Nml Nml Nml Nml Nml Nml Nml Nml Nml Nml Nml Nml N/A  Right BicepsFemS Nml Nml Nml Nml Nml Nml Nml Nml Nml Nml Nml Nml N/A  Right GluteusMed Nml Nml Nml Nml Nml Nml Nml Nml Nml Nml Nml Nml N/A      Waveforms:

## 2019-01-17 NOTE — Telephone Encounter (Signed)
Patient given results

## 2019-01-18 ENCOUNTER — Telehealth: Payer: Self-pay | Admitting: *Deleted

## 2019-01-18 ENCOUNTER — Other Ambulatory Visit: Payer: Self-pay | Admitting: *Deleted

## 2019-01-18 MED ORDER — GABAPENTIN 300 MG PO CAPS: 300.0000 mg | ORAL_CAPSULE | Freq: Two times a day (BID) | ORAL | 5 refills | Status: DC

## 2019-01-18 NOTE — Telephone Encounter (Signed)
-----   Message from Alda Berthold, DO sent at 01/18/2019  8:10 AM EST ----- Please inform patient that he has neuropathy in both feet causing the numbness, as well as nerve being pinched at the left knee, which I discussed in the office. Remind him not to cross the legs.  For his pain, we can increase gabapentin to 300mg  twice daily - please send rx, if he would like.  Return to clinic in 2 months, or sooner if he develops foot weakness or worsening pain.

## 2019-01-18 NOTE — Telephone Encounter (Signed)
Patient given results.  Rx sent in for gabapentin 300 mg bid.  Appointment card mailed.

## 2019-01-19 ENCOUNTER — Ambulatory Visit (INDEPENDENT_AMBULATORY_CARE_PROVIDER_SITE_OTHER): Payer: Medicare Other | Admitting: Podiatry

## 2019-01-19 ENCOUNTER — Encounter: Payer: Self-pay | Admitting: Podiatry

## 2019-01-19 DIAGNOSIS — G5762 Lesion of plantar nerve, left lower limb: Secondary | ICD-10-CM | POA: Diagnosis not present

## 2019-01-19 DIAGNOSIS — G5782 Other specified mononeuropathies of left lower limb: Secondary | ICD-10-CM

## 2019-01-19 MED ORDER — OXYCODONE-ACETAMINOPHEN 10-325 MG PO TABS: 1.0000 | ORAL_TABLET | ORAL | 0 refills | Status: DC | PRN

## 2019-01-19 NOTE — Progress Notes (Signed)
He presents today for follow-up of neuroma third interdigital space of the left foot.  States that the nerve conduction velocity exam of the left lower extremity was done.  He states is really been no changes but the neuroma seems to be doing a little bit better.  He just had a carpal tunnel performed on his left hand and he states that it seems to be doing better.  Objective: Signs are stable he is alert and oriented x3.  Pulses are nonpalpable.  He has pain on palpation to the third interdigital space of the left foot.  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  Plan: He will be following up with neurology in the next few weeks for consult to see if there is anything can be done about his sensory and motor neuropathies.  I also injected the area today with dehydrated alcohol to the stump neuroma.  He states that we were right on it with the injection.  Also provided him with 5 days of narcotic.

## 2019-02-09 ENCOUNTER — Ambulatory Visit (INDEPENDENT_AMBULATORY_CARE_PROVIDER_SITE_OTHER): Payer: Medicare Other | Admitting: Podiatry

## 2019-02-09 ENCOUNTER — Encounter: Payer: Self-pay | Admitting: Podiatry

## 2019-02-09 DIAGNOSIS — G5762 Lesion of plantar nerve, left lower limb: Secondary | ICD-10-CM

## 2019-02-09 DIAGNOSIS — G5782 Other specified mononeuropathies of left lower limb: Secondary | ICD-10-CM

## 2019-02-09 MED ORDER — OXYCODONE-ACETAMINOPHEN 10-325 MG PO TABS: 1.0000 | ORAL_TABLET | ORAL | 0 refills | Status: DC | PRN

## 2019-02-09 NOTE — Progress Notes (Signed)
He presents today stating that the left third interdigital space was doing really good for several days he said then the medicine seem to wear off and had to start taking my pain pills.  He said now it seems to be aching like a tooth ache.  Objective: Vital signs are stable he is alert and oriented x3 there is pain on palpation to the third interspace of the left foot.  He is nonoperable with peripheral vascular disease.  Assessment: Neuroma neuritis third interspace left foot.  Plan: Follow-up with him on an as-needed basis or in 3 weeks.  Wrote another prescription for Percocet 5 days and injected 2 cc 4% dehydrated alcohol with local anesthetic to the point of maximal tenderness third interspace left foot.

## 2019-03-02 ENCOUNTER — Ambulatory Visit (INDEPENDENT_AMBULATORY_CARE_PROVIDER_SITE_OTHER): Payer: Medicare Other | Admitting: Podiatry

## 2019-03-02 ENCOUNTER — Encounter: Payer: Self-pay | Admitting: Podiatry

## 2019-03-02 ENCOUNTER — Other Ambulatory Visit: Payer: Self-pay

## 2019-03-02 DIAGNOSIS — G5782 Other specified mononeuropathies of left lower limb: Secondary | ICD-10-CM

## 2019-03-02 DIAGNOSIS — G5762 Lesion of plantar nerve, left lower limb: Secondary | ICD-10-CM

## 2019-03-02 MED ORDER — OXYCODONE-ACETAMINOPHEN 10-325 MG PO TABS: 1.0000 | ORAL_TABLET | ORAL | 0 refills | Status: DC | PRN

## 2019-03-02 NOTE — Progress Notes (Signed)
He presents today for follow-up of neuroma third interspace of the left foot.  States that he was doing great until just last night and it started hurting again.  Objective: Vital signs are stable he is alert and oriented x3 has pain on palpation to the proximal edge of the incision site between the third and fourth metatarsals.  Assessment: Palpable Mulder's click neuritis probably stump neuroma.  Plan: At this point we injected another dose of dehydrated alcohol which appears to be slowly alleviating his symptoms.  Also provided him with another dose of narcotic to alleviate symptoms until further notice.  Follow-up with him in 3 weeks.

## 2019-03-06 ENCOUNTER — Ambulatory Visit: Payer: Medicare Other | Admitting: Neurology

## 2019-03-10 ENCOUNTER — Ambulatory Visit (INDEPENDENT_AMBULATORY_CARE_PROVIDER_SITE_OTHER): Payer: Medicare Other | Admitting: Neurology

## 2019-03-10 ENCOUNTER — Other Ambulatory Visit: Payer: Self-pay

## 2019-03-10 ENCOUNTER — Encounter: Payer: Self-pay | Admitting: Neurology

## 2019-03-10 VITALS — BP 112/70 | HR 78 | Temp 98.3°F | Ht 74.0 in | Wt 170.5 lb

## 2019-03-10 DIAGNOSIS — Z7409 Other reduced mobility: Secondary | ICD-10-CM | POA: Diagnosis not present

## 2019-03-10 DIAGNOSIS — G5732 Lesion of lateral popliteal nerve, left lower limb: Secondary | ICD-10-CM | POA: Diagnosis not present

## 2019-03-10 DIAGNOSIS — M4306 Spondylolysis, lumbar region: Secondary | ICD-10-CM | POA: Diagnosis not present
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
## 2019-03-20 ENCOUNTER — Telehealth: Payer: Self-pay | Admitting: Podiatry

## 2019-03-20 ENCOUNTER — Ambulatory Visit: Payer: Medicare Other | Admitting: Neurology

## 2019-03-20 NOTE — Telephone Encounter (Signed)
Patient called requesting a pain medication be sent in to his pharmacy. Please give pt a call

## 2019-03-23 ENCOUNTER — Telehealth: Payer: Self-pay | Admitting: *Deleted

## 2019-03-23 NOTE — Telephone Encounter (Signed)
Pt called for pain medication.

## 2019-03-24 MED ORDER — OXYCODONE-ACETAMINOPHEN 10-325 MG PO TABS: 1.0000 | ORAL_TABLET | ORAL | 0 refills | Status: DC | PRN

## 2019-03-24 NOTE — Telephone Encounter (Signed)
Rx sent as requested.

## 2019-03-24 NOTE — Telephone Encounter (Signed)
Dr. March Rummage sent refill to pharmacy

## 2019-03-28 ENCOUNTER — Ambulatory Visit (INDEPENDENT_AMBULATORY_CARE_PROVIDER_SITE_OTHER): Payer: Medicare Other | Admitting: Podiatry

## 2019-03-28 ENCOUNTER — Other Ambulatory Visit: Payer: Self-pay

## 2019-03-28 ENCOUNTER — Encounter: Payer: Self-pay | Admitting: Podiatry

## 2019-03-28 ENCOUNTER — Telehealth: Payer: Self-pay | Admitting: Neurology

## 2019-03-28 VITALS — Temp 97.7°F

## 2019-03-28 DIAGNOSIS — G5762 Lesion of plantar nerve, left lower limb: Secondary | ICD-10-CM | POA: Diagnosis not present

## 2019-03-28 DIAGNOSIS — G5782 Other specified mononeuropathies of left lower limb: Secondary | ICD-10-CM

## 2019-03-28 NOTE — Progress Notes (Signed)
Addendum  Patient can walk up to 25 feet with cane without stopping.   Latrisa Hellums K. Posey Pronto, DO

## 2019-03-28 NOTE — Telephone Encounter (Signed)
Can you put this in patient's note?  (how many feet he can walk with cane without having to stop)

## 2019-03-28 NOTE — Telephone Encounter (Signed)
See addendum on last OV.
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
## 2019-03-28 NOTE — Telephone Encounter (Signed)
Russell [name] called from [employer] and needs a question answered regarding how far patient can functionally ambulate with a cane? Reference number is [id number]. Thanks

## 2019-03-29 NOTE — Telephone Encounter (Signed)
Note faxed.

## 2019-03-30 ENCOUNTER — Ambulatory Visit: Payer: Medicare Other | Admitting: Podiatry

## 2019-04-18 ENCOUNTER — Telehealth: Payer: Self-pay | Admitting: Podiatry

## 2019-04-18 ENCOUNTER — Ambulatory Visit: Payer: Medicare Other | Admitting: Podiatry

## 2019-04-18 DIAGNOSIS — G5780 Other specified mononeuropathies of unspecified lower limb: Secondary | ICD-10-CM | POA: Insufficient documentation

## 2019-04-18 NOTE — Telephone Encounter (Signed)
Patient called requesting a refill of his pain medication.

## 2019-04-19 ENCOUNTER — Other Ambulatory Visit: Payer: Self-pay | Admitting: Podiatry

## 2019-04-19 MED ORDER — HYDROCODONE-ACETAMINOPHEN 10-325 MG PO TABS: 1.0000 | ORAL_TABLET | Freq: Four times a day (QID) | ORAL | 0 refills | Status: AC | PRN

## 2019-04-19 NOTE — Telephone Encounter (Signed)
Dr. Milinda Pointer sent in pain meds to pharmacy

## 2019-04-19 NOTE — Telephone Encounter (Signed)
I informed pt of Dr. Stephenie Acres orders to the pharmacy. Pt states he was here at his appt time but 10 people were ahead of him and he told the scheduler but she would not let him see the doctor. I apologized to pt and told him he should call our appt line and inform them he is in lining waiting to be screened and they will inform Dr. Milinda Pointer, and to get here a little earlier.

## 2019-04-19 NOTE — Telephone Encounter (Signed)
Dr. Milinda Pointer was informed of the issues at pre-screening station. Make sure patient was rescheduled for next week.

## 2019-04-20 ENCOUNTER — Ambulatory Visit: Payer: Medicare Other | Admitting: Podiatry

## 2019-04-27 ENCOUNTER — Ambulatory Visit (INDEPENDENT_AMBULATORY_CARE_PROVIDER_SITE_OTHER): Payer: Medicare Other | Admitting: Podiatry

## 2019-04-27 ENCOUNTER — Encounter: Payer: Self-pay | Admitting: Podiatry

## 2019-04-27 ENCOUNTER — Other Ambulatory Visit: Payer: Self-pay

## 2019-04-27 VITALS — Temp 98.4°F

## 2019-04-27 DIAGNOSIS — G5762 Lesion of plantar nerve, left lower limb: Secondary | ICD-10-CM | POA: Diagnosis not present

## 2019-04-27 DIAGNOSIS — G5782 Other specified mononeuropathies of left lower limb: Secondary | ICD-10-CM

## 2019-04-27 MED ORDER — HYDROCODONE-ACETAMINOPHEN 5-325 MG PO TABS: 1.0000 | ORAL_TABLET | Freq: Four times a day (QID) | ORAL | 0 refills | Status: DC | PRN

## 2019-04-27 NOTE — Progress Notes (Signed)
He presents today for follow-up of his neuroma third interspace of the left foot.  States that he was doing fine until he could not come in for his other injection last week.  He still states that his pain is in the third interdigital space that it is killing that fourth toe.  Objective: Vital signs are stable he is alert and oriented x3.  Pulses are palpable.  Palpable Mulder's click third interspace of the left foot with pain on palpation.  Assessment: Neuroma neuritis chronic peripheral vascular disease.  Plan: I went ahead and injected the area today with 2 cc of dehydrated alcohol and local anesthetic.  Also wrote another 5-day prescription for Vicodin.  Follow-up with me in 3 weeks.

## 2019-05-18 ENCOUNTER — Encounter: Payer: Self-pay | Admitting: Podiatry

## 2019-05-18 ENCOUNTER — Other Ambulatory Visit: Payer: Self-pay

## 2019-05-18 ENCOUNTER — Ambulatory Visit (INDEPENDENT_AMBULATORY_CARE_PROVIDER_SITE_OTHER): Payer: Medicare Other | Admitting: Podiatry

## 2019-05-18 VITALS — Temp 96.6°F

## 2019-05-18 DIAGNOSIS — G5762 Lesion of plantar nerve, left lower limb: Secondary | ICD-10-CM

## 2019-05-18 DIAGNOSIS — G5782 Other specified mononeuropathies of left lower limb: Secondary | ICD-10-CM

## 2019-05-18 MED ORDER — HYDROCODONE-ACETAMINOPHEN 10-325 MG PO TABS: 1.0000 | ORAL_TABLET | Freq: Four times a day (QID) | ORAL | 0 refills | Status: AC | PRN

## 2019-05-18 NOTE — Progress Notes (Signed)
He presents today for follow-up of his stump neuroma to the third interdigital space of the left foot.  States that is really finally how it really starts to hurt like 1 to 2 days before I am due for my appointment.  But otherwise it was doing very well.  He is referring to neuroma and stump neuroma third interdigital space of his left foot.  Objective: Vital signs are stable he is alert and oriented x3 pulses are poor to the left lower extremity.  He has a palpable Mulder's click to the third interdigital space just at the proximal edge of an old surgical scar.  Assessment: Probable neuroma or stump neuroma at the surgical site.  Plan: At this point injected 2 cc 4% dehydrated alcohol and local anesthetic to the point of maximal tenderness.  Also wrote him a 5-day prescription for Norco.  I will follow-up with him in 3 to 4 weeks.

## 2019-06-08 ENCOUNTER — Ambulatory Visit (INDEPENDENT_AMBULATORY_CARE_PROVIDER_SITE_OTHER): Payer: Medicare Other | Admitting: Podiatry

## 2019-06-08 ENCOUNTER — Encounter: Payer: Self-pay | Admitting: Podiatry

## 2019-06-08 ENCOUNTER — Other Ambulatory Visit: Payer: Self-pay

## 2019-06-08 VITALS — Temp 97.2°F

## 2019-06-08 DIAGNOSIS — M779 Enthesopathy, unspecified: Secondary | ICD-10-CM

## 2019-06-08 DIAGNOSIS — M778 Other enthesopathies, not elsewhere classified: Secondary | ICD-10-CM

## 2019-06-08 MED ORDER — HYDROCODONE-ACETAMINOPHEN 5-325 MG PO TABS: 1.0000 | ORAL_TABLET | Freq: Four times a day (QID) | ORAL | 0 refills | Status: DC | PRN

## 2019-06-08 NOTE — Progress Notes (Signed)
He presents today for follow-up of his neuritis and neuroma to the second interdigital space and third interspace of the left foot.  Objective: Vital signs are stable he is alert and oriented x3 he has pain on palpation to the second and third interdigital space of the left foot.  Assessment: Neuroma and neuritis second and third interdigital space.  Plan: At this point I injected 10 mg Kenalog 5 mg Marcaine to the second and third interdigital space of the left foot.  And I also provided him with pain medication.  Follow-up with him in about 3 to 4 weeks

## 2019-06-22 ENCOUNTER — Other Ambulatory Visit: Payer: Self-pay | Admitting: Podiatry

## 2019-06-22 ENCOUNTER — Ambulatory Visit (INDEPENDENT_AMBULATORY_CARE_PROVIDER_SITE_OTHER): Payer: Medicare Other | Admitting: Podiatry

## 2019-06-22 ENCOUNTER — Encounter: Payer: Self-pay | Admitting: Podiatry

## 2019-06-22 ENCOUNTER — Other Ambulatory Visit: Payer: Self-pay

## 2019-06-22 VITALS — Temp 98.1°F

## 2019-06-22 DIAGNOSIS — G5762 Lesion of plantar nerve, left lower limb: Secondary | ICD-10-CM | POA: Diagnosis not present

## 2019-06-22 DIAGNOSIS — G5782 Other specified mononeuropathies of left lower limb: Secondary | ICD-10-CM

## 2019-06-22 MED ORDER — HYDROCODONE-ACETAMINOPHEN 5-325 MG PO TABS: 1.0000 | ORAL_TABLET | Freq: Four times a day (QID) | ORAL | 0 refills | Status: DC | PRN

## 2019-06-22 MED ORDER — HYDROCODONE-ACETAMINOPHEN 10-325 MG PO TABS: 1.0000 | ORAL_TABLET | Freq: Three times a day (TID) | ORAL | 0 refills | Status: DC | PRN

## 2019-06-22 NOTE — Progress Notes (Signed)
He presents today for follow-up of his neuroma and neuritis he states that after the last dehydrated alcohol injection really did not help at all and the medicine was decreased and I have been in pain all this time because the medicine did not seem to be working very well.  Objective: Vital signs are stable he is alert and oriented x3 still has neuroma to the root area for the neuroma was previously resected I imagine this is a stump neuroma.  His pulses are even less palpable than before there is couple small bruise-like areas on the dorsum of his foot there would not have to keep an eye on make sure that he is not becoming ischemic.  He relates more pain in the neuroma area but no pain generally throughout the foot.  Assessment: Neuritis neuroma third interdigital space left foot stump neuroma.  Plan: Continue use of dehydrated alcohol this is only thing we can do for him at this point he is not a surgical candidate and we will continue to give him his pain medication were only given 20 tablets every 3 to 4 weeks.

## 2019-07-05 ENCOUNTER — Telehealth: Payer: Self-pay | Admitting: Podiatry

## 2019-07-05 NOTE — Telephone Encounter (Signed)
Can not have a refill for another week.

## 2019-07-05 NOTE — Telephone Encounter (Signed)
Pts appt was rescheduled from 07/13/19 to 07/18/19 due to a scheduling conflict and patient stated he is still having pain and would like a refill on his medication.

## 2019-07-06 NOTE — Telephone Encounter (Signed)
I informed pt of Dr. Stephenie Acres review of chart and previous orders.

## 2019-07-13 ENCOUNTER — Ambulatory Visit: Payer: Medicare Other | Admitting: Podiatry

## 2019-07-18 ENCOUNTER — Ambulatory Visit (INDEPENDENT_AMBULATORY_CARE_PROVIDER_SITE_OTHER): Payer: Medicare Other | Admitting: Podiatry

## 2019-07-18 ENCOUNTER — Other Ambulatory Visit: Payer: Self-pay

## 2019-07-18 ENCOUNTER — Encounter: Payer: Self-pay | Admitting: Podiatry

## 2019-07-18 VITALS — Temp 97.8°F

## 2019-07-18 DIAGNOSIS — M7752 Other enthesopathy of left foot: Secondary | ICD-10-CM

## 2019-07-18 DIAGNOSIS — G5762 Lesion of plantar nerve, left lower limb: Secondary | ICD-10-CM | POA: Diagnosis not present

## 2019-07-18 DIAGNOSIS — M778 Other enthesopathies, not elsewhere classified: Secondary | ICD-10-CM

## 2019-07-18 DIAGNOSIS — G5782 Other specified mononeuropathies of left lower limb: Secondary | ICD-10-CM

## 2019-07-18 MED ORDER — HYDROCODONE-ACETAMINOPHEN 10-325 MG PO TABS: 1.0000 | ORAL_TABLET | Freq: Three times a day (TID) | ORAL | 0 refills | Status: DC | PRN

## 2019-07-18 NOTE — Progress Notes (Signed)
He presents today for follow-up of his neuroma third interdigital space of his left foot since that is really been acting up and this year's been acting up to his he points to the DIPJ fourth toe left foot.  Objective: Nonpalpable pulses capillary fill time is immediate feet are warm to the touch.  Stump neuroma proximal end of a neurectomy incision is still painful palpation.  He has arthritic changes the DIPJ fourth toe left foot.  Assessment: DIPJ arthritis fourth left and neuroma stump neuroma third interdigital space left foot.  Plan: After sterile Betadine skin prep I injected 2 cc 4% dehydrated alcohol and local anesthetic to the proximal nail of an old incision site of the third interdigital space left.  I also injected 2 mg of dexamethasone about the DIPJ fourth digit left foot.  He tolerated procedure well without complications follow-up with him in 3 weeks provide him prescription for Percocet.

## 2019-08-10 ENCOUNTER — Encounter: Payer: Self-pay | Admitting: Podiatry

## 2019-08-10 ENCOUNTER — Ambulatory Visit (INDEPENDENT_AMBULATORY_CARE_PROVIDER_SITE_OTHER): Payer: Medicare Other | Admitting: Podiatry

## 2019-08-10 ENCOUNTER — Other Ambulatory Visit: Payer: Self-pay

## 2019-08-10 VITALS — Temp 97.2°F

## 2019-08-10 DIAGNOSIS — G5762 Lesion of plantar nerve, left lower limb: Secondary | ICD-10-CM

## 2019-08-10 DIAGNOSIS — G5782 Other specified mononeuropathies of left lower limb: Secondary | ICD-10-CM

## 2019-08-10 DIAGNOSIS — L02619 Cutaneous abscess of unspecified foot: Secondary | ICD-10-CM

## 2019-08-10 DIAGNOSIS — L03119 Cellulitis of unspecified part of limb: Secondary | ICD-10-CM | POA: Diagnosis not present

## 2019-08-10 MED ORDER — HYDROCODONE-ACETAMINOPHEN 10-325 MG PO TABS: 1.0000 | ORAL_TABLET | Freq: Four times a day (QID) | ORAL | 0 refills | Status: AC | PRN

## 2019-08-10 MED ORDER — DOXYCYCLINE HYCLATE 100 MG PO TABS: 100.0000 mg | ORAL_TABLET | Freq: Two times a day (BID) | ORAL | 0 refills | Status: DC

## 2019-08-10 NOTE — Progress Notes (Signed)
He presents today for follow-up of neuroma of his left foot states that he was getting better for about a week then it started burning again and blister started to pop up on top of the foot and now it is really sore and red.  He states that seems to be improving from where it was.  He states that he has been keeping Neosporin on it.  Objective: Vital signs are stable alert oriented x3.  Pulses are palpable.  He has vesicular eruption dorsal aspect of the foot has gone on to develop into a cellulitis with open wounds and is now receding.  There is no purulence no malodor.  Assessment: Dermatitis associated with cellulitis could be associated with the injection.  Plan: At this point no injection was put in however did start him on doxycycline twice a day and I will follow-up with him in 3 weeks encouraged him to continue to utilize some type of emollient or antibiotic ointment cover during the daytime.  I wrote a prescription for pain medication and antibiotic.

## 2019-08-31 ENCOUNTER — Other Ambulatory Visit: Payer: Self-pay

## 2019-08-31 ENCOUNTER — Encounter: Payer: Self-pay | Admitting: Podiatry

## 2019-08-31 ENCOUNTER — Ambulatory Visit (INDEPENDENT_AMBULATORY_CARE_PROVIDER_SITE_OTHER): Payer: Medicare Other | Admitting: Podiatry

## 2019-08-31 DIAGNOSIS — G5782 Other specified mononeuropathies of left lower limb: Secondary | ICD-10-CM

## 2019-08-31 DIAGNOSIS — G5762 Lesion of plantar nerve, left lower limb: Secondary | ICD-10-CM | POA: Diagnosis not present

## 2019-08-31 MED ORDER — OXYCODONE-ACETAMINOPHEN 10-325 MG PO TABS: 1.0000 | ORAL_TABLET | ORAL | 0 refills | Status: DC | PRN

## 2019-09-02 ENCOUNTER — Encounter: Payer: Self-pay | Admitting: Podiatry

## 2019-09-02 NOTE — Progress Notes (Signed)
He presents today for follow-up of his neuroma he says I did not really notice how well the medicine was working till I have not had it for a couple of times now.  Since I really need that shot been hurting a lot the blisters have all dried up now.  Objective: Vital signs are stable alert and oriented x3.  Pulses are palpable.  Pain on palpation stump neuroma third interdigital space left foot.  Assessment: Stump neuroma third interspace left foot.  Neuritis.  Plan: Injected 2 cc 4% dehydrated alcohol with local anesthetic to the interspace today wrote a prescription for his Percocet and I will follow-up with him in 3 to 4 weeks.

## 2019-09-21 ENCOUNTER — Ambulatory Visit (INDEPENDENT_AMBULATORY_CARE_PROVIDER_SITE_OTHER): Payer: Medicare Other | Admitting: Podiatry

## 2019-09-21 ENCOUNTER — Other Ambulatory Visit: Payer: Self-pay

## 2019-09-21 ENCOUNTER — Encounter: Payer: Self-pay | Admitting: Podiatry

## 2019-09-21 DIAGNOSIS — G5762 Lesion of plantar nerve, left lower limb: Secondary | ICD-10-CM

## 2019-09-21 DIAGNOSIS — G5782 Other specified mononeuropathies of left lower limb: Secondary | ICD-10-CM

## 2019-09-21 MED ORDER — OXYCODONE-ACETAMINOPHEN 10-325 MG PO TABS: 1.0000 | ORAL_TABLET | Freq: Three times a day (TID) | ORAL | 0 refills | Status: AC | PRN

## 2019-09-23 ENCOUNTER — Encounter: Payer: Self-pay | Admitting: Podiatry

## 2019-09-23 NOTE — Progress Notes (Signed)
He presents today for follow-up of his neuritis states that is still bothers him considerably.  Third interdigital space left foot.  Objective: Vital signs are stable alert and oriented x3.  Pulses remain nonpalpable pain on palpation third interdigital space of the left foot.  Palpable Mulder's click.  Digital deformities.  Assessment: Moderate to severe peripheral vascular disease with chronic neuritis and stump neuroma third interspace left.  Plan: Expressed to him once again today why we cannot do surgery and that we must continue these types of injections.  He understands that and is amenable to it once again today.  Reinjected his neuroma third interspace left foot with 2 cc 4% dehydrated alcohol and local anesthetic.  Follow-up with him in 3 weeks wrote another prescription for 5 days of narcotic.

## 2019-10-17 ENCOUNTER — Other Ambulatory Visit: Payer: Self-pay

## 2019-10-17 ENCOUNTER — Ambulatory Visit (INDEPENDENT_AMBULATORY_CARE_PROVIDER_SITE_OTHER): Payer: Medicare Other | Admitting: Podiatry

## 2019-10-17 ENCOUNTER — Encounter: Payer: Self-pay | Admitting: Podiatry

## 2019-10-17 DIAGNOSIS — G5782 Other specified mononeuropathies of left lower limb: Secondary | ICD-10-CM

## 2019-10-17 DIAGNOSIS — G5762 Lesion of plantar nerve, left lower limb: Secondary | ICD-10-CM

## 2019-10-17 MED ORDER — OXYCODONE-ACETAMINOPHEN 10-325 MG PO TABS: 1.0000 | ORAL_TABLET | ORAL | 0 refills | Status: AC | PRN

## 2019-10-17 MED ORDER — OXYCODONE-ACETAMINOPHEN 10-325 MG PO TABS: 1.0000 | ORAL_TABLET | ORAL | 0 refills | Status: DC | PRN

## 2019-10-18 NOTE — Progress Notes (Signed)
He presents today for follow-up of neuroma third interdigital space of the left foot states that is giving me if it.  Objective: Vital signs are stable he is alert and oriented x3.  Pulses are palpable.  He has pain to palpation third interspace of the left foot that appears to be improving with palpation today.  Assessment: Slowly resolving neuroma and stump neuroma third interdigital space left foot.  Neuropathic pain.  Plan: Injected 1/2 cc of dehydrated alcohol to the third interspace of the left foot at the base of the fourth toe immediately.  Also provided him with 21 tablets of Vicodin.

## 2019-11-07 ENCOUNTER — Encounter: Payer: Self-pay | Admitting: Podiatry

## 2019-11-07 ENCOUNTER — Other Ambulatory Visit: Payer: Self-pay

## 2019-11-07 ENCOUNTER — Other Ambulatory Visit: Payer: Self-pay | Admitting: Cardiology

## 2019-11-07 ENCOUNTER — Ambulatory Visit (INDEPENDENT_AMBULATORY_CARE_PROVIDER_SITE_OTHER): Payer: Medicare Other | Admitting: Podiatry

## 2019-11-07 DIAGNOSIS — B351 Tinea unguium: Secondary | ICD-10-CM | POA: Diagnosis not present

## 2019-11-07 DIAGNOSIS — Z20822 Contact with and (suspected) exposure to covid-19: Secondary | ICD-10-CM

## 2019-11-07 DIAGNOSIS — G5762 Lesion of plantar nerve, left lower limb: Secondary | ICD-10-CM

## 2019-11-07 DIAGNOSIS — G5782 Other specified mononeuropathies of left lower limb: Secondary | ICD-10-CM

## 2019-11-07 DIAGNOSIS — M79676 Pain in unspecified toe(s): Secondary | ICD-10-CM

## 2019-11-07 MED ORDER — HYDROCODONE-ACETAMINOPHEN 10-325 MG PO TABS: 1.0000 | ORAL_TABLET | Freq: Three times a day (TID) | ORAL | 0 refills | Status: DC | PRN

## 2019-11-07 NOTE — Progress Notes (Signed)
He presents today chief complaint of painful third and fourth toes of the left foot.  States that the best time that it has not hurt was when we injected each toe individually.  He is also stating he has painful toenails.  Objective: Vital signs stable he is alert and oriented x3 pulses are nonpalpable bilateral no open lesions or wounds.  Toenails are long thick yellow dystrophic-like mycotic.  He also has chronic pain on palpation to the third fourth toes of the left foot with a palpable stump neuroma.  Assessment: Neuroma third interdigital space with radiating pains into the toes 3 and 4 laterally and medially respectively.  Pain in limb secondary to onychomycosis.  Plan: Debridement of toenails 1 through 5 bilaterally.  Also injected toes #3 #4 with the dehydrated alcohol and local anesthetic just to the medial side and lateral side respectively.  Also provided him with a prescription for Vicodin.

## 2019-11-09 LAB — NOVEL CORONAVIRUS, NAA: SARS-CoV-2, NAA: NOT DETECTED

## 2019-11-28 ENCOUNTER — Other Ambulatory Visit: Payer: Self-pay

## 2019-11-28 ENCOUNTER — Other Ambulatory Visit: Payer: Self-pay | Admitting: Neurology

## 2019-11-28 ENCOUNTER — Ambulatory Visit: Payer: Medicare Other | Admitting: Podiatry

## 2019-11-28 ENCOUNTER — Telehealth: Payer: Self-pay | Admitting: Podiatry

## 2019-11-28 DIAGNOSIS — Z20822 Contact with and (suspected) exposure to covid-19: Secondary | ICD-10-CM

## 2019-11-28 NOTE — Telephone Encounter (Signed)
Pt called to see if he can get something for his pain [facility] [address] [place] [employer]

## 2019-11-29 ENCOUNTER — Other Ambulatory Visit: Payer: Self-pay | Admitting: Podiatry

## 2019-11-29 LAB — NOVEL CORONAVIRUS, NAA: SARS-CoV-2, NAA: NOT DETECTED

## 2019-11-29 MED ORDER — HYDROCODONE-ACETAMINOPHEN 10-325 MG PO TABS: 1.0000 | ORAL_TABLET | Freq: Three times a day (TID) | ORAL | 0 refills | Status: DC | PRN

## 2019-11-29 NOTE — Telephone Encounter (Signed)
I refilled it

## 2019-12-01 ENCOUNTER — Ambulatory Visit (INDEPENDENT_AMBULATORY_CARE_PROVIDER_SITE_OTHER): Payer: Medicare Other | Admitting: Podiatry

## 2019-12-01 ENCOUNTER — Other Ambulatory Visit: Payer: Self-pay

## 2019-12-01 ENCOUNTER — Encounter: Payer: Self-pay | Admitting: Podiatry

## 2019-12-01 DIAGNOSIS — M778 Other enthesopathies, not elsewhere classified: Secondary | ICD-10-CM

## 2019-12-01 DIAGNOSIS — M79675 Pain in left toe(s): Secondary | ICD-10-CM | POA: Diagnosis not present

## 2019-12-01 DIAGNOSIS — M7752 Other enthesopathy of left foot: Secondary | ICD-10-CM | POA: Diagnosis not present

## 2019-12-01 NOTE — Progress Notes (Signed)
Subjective:  Patient ID: Russell [name], male    DOB: May 20, 1941,  MRN: [id number]  Chief Complaint  Patient presents with  . Foot Problem    the 3rd and 4th toes on the left foot are hurting and Dr Milinda Pointer gave a shot a while back and it did help and would like some pain medicine as well    78 y.o. male presents with the above complaint.  Patient presents with complaints of left third and fourth digit metatarsophalangeal joint capsulitis.  Patient is well-known to Dr. Milinda Pointer who has been treating him with injection.  Patient states the injection gives him relief for a little bit but eventually the pain comes back.  He would like to get another injection today to help resolve some of his pain.  He denies any other acute complaints.  He denies any other treatment modalities.   Review of Systems: Negative except as noted in the HPI. Denies N/V/F/Ch.  Past Medical History:  Diagnosis Date  . AKI (acute kidney injury) ([place]) 06/23/2017  . Arthritis    SHOUDLERS, LUMBAR  . BPH with obstruction/lower urinary tract symptoms   . BPH with urinary obstruction 01/15/2016  . COPD (chronic obstructive pulmonary disease) ([place])   . Coronary atherosclerosis    denied knowledge of this 10/27/16  . Depression   . Diverticulosis of colon   . Dyspnea   . ED (erectile dysfunction)   . Full dentures   . GERD (gastroesophageal reflux disease)    not current  . Glaucoma   . History of gastritis   . History of scabies    2008  . 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 History of syncope    03-22-2014  DX  VAGAL RESPONSE  . Hyperlipidemia, mixed   . Hypertension   . Mitral regurgitation   . Peripheral arterial disease ([place])    one vessel runoff bilaterally via peroneal arteries  . Prostate cancer ([place])   . Seizures ([place]) 03/2014   ? or syncope- patient refused to be admitted for further studies- "felt better"  . Type 2 diabetes, diet controlled ([place])    patient and his  wife said no- have not been told 01/21/17  . Wears glasses     Current Outpatient Medications:  .  pregabalin (LYRICA) 50 MG capsule, TAKE 1 CAPSULE(50 MG) BY MOUTH TWICE DAILY, Disp: , Rfl:  .  amitriptyline (ELAVIL) 25 MG tablet, TK 1 T PO HS, Disp: , Rfl:  .  AZOPT 1 % ophthalmic suspension, Place 1 drop into the left eye 3 (three) times daily. , Disp: , Rfl:  .  bimatoprost (LUMIGAN) 0.01 % SOLN, Place 1 drop into both eyes at bedtime., Disp: 30 mL, Rfl: 3 .  brimonidine (ALPHAGAN) 0.2 % ophthalmic solution, INT 1 GTT IN OU BID, Disp: , Rfl:  .  brimonidine-timolol (COMBIGAN) 0.2-0.5 % ophthalmic solution, Place 1 drop into both eyes 2 (two) times daily. , Disp: , Rfl:  .  CHANTIX 1 MG tablet, , Disp: , Rfl:  .  diclofenac sodium (VOLTAREN) 1 % GEL, Apply 2 g topically daily. , Disp: , Rfl:  .  esomeprazole (NEXIUM) 40 MG capsule, Take 1 capsule (40 mg total) by mouth daily. (Patient taking differently: Take 40 mg by mouth as needed. ), Disp: 30 capsule, Rfl: 2 .  ferrous sulfate 325 (65 FE) MG tablet, Take 325 mg by mouth daily. , Disp: , Rfl:  .  gabapentin (NEURONTIN) 300 MG capsule, TAKE 1 CAPSULE(300 MG) BY MOUTH TWICE DAILY, Disp: 60 capsule, Rfl: 5 .  hydrochlorothiazide (HYDRODIURIL) 12.5 MG tablet, Take 12.5 mg by mouth daily. , Disp: , Rfl: 3 .  HYDROcodone-acetaminophen (NORCO) 10-325 MG tablet, Take 1 tablet by mouth every 8 (eight) hours as needed., Disp: 21 tablet, Rfl: 0 .  losartan (COZAAR) 100 MG tablet, Take 100 mg by mouth daily., Disp: , Rfl: 4 .  meclizine (ANTIVERT) 25 MG tablet, Take 25 mg by mouth daily., Disp: , Rfl:  .  Multiple Vitamins-Minerals (CENTRUM SILVER ADULT 50+ PO), Take 1 tablet by mouth daily., Disp: , Rfl:  .  MYRBETRIQ 25 MG TB24 tablet, , Disp: , Rfl:  .  Omega-3 Fatty Acids (FISH OIL) 1000 MG CPDR, Take 1,000 mg by mouth daily., Disp: , Rfl:  .  oxybutynin (DITROPAN-XL) 10 MG 24 hr tablet, Take 10 mg by mouth at bedtime. , Disp: , Rfl: 2 .   pravastatin (PRAVACHOL) 80 MG tablet, Take 80 mg by mouth daily. , Disp: , Rfl: 2 .  pregabalin (LYRICA) 50 MG capsule, Take by mouth., Disp: , Rfl:  .  tamsulosin (FLOMAX) 0.4 MG CAPS capsule, Take 0.4 mg by mouth at bedtime. , Disp: , Rfl:   Social History   Tobacco Use  Smoking Status Current Every Day Smoker  . Packs/day: 1.00  . Years: 59.00  . Pack years: 59.00  . Types: Cigarettes  Smokeless Tobacco Never Used  Tobacco Comment   Started Chantix 10/26/16    No Known Allergies Objective:  There were no vitals filed for this visit. There is no height or weight on file to calculate BMI. Constitutional Well developed. Well nourished.  Vascular Dorsalis pedis pulses palpable bilaterally. Posterior tibial pulses palpable bilaterally. Capillary refill normal to all digits.  No cyanosis or clubbing noted. Pedal hair growth normal.  Neurologic Normal speech. Oriented to person, place, and time. Epicritic sensation to light touch grossly present bilaterally.  Dermatologic Nails well groomed and normal in appearance. No open wounds. No skin lesions.  Orthopedic:  Pain on palpation to the left third digit metatarsophalangeal joint and left fourth digit metatarsophalangeal joint.  Pain with active and passive range of motion of the third and fourth metatarsophalangeal joint.  No pain on first MPJ second MPJ and fifth MPJ.   Radiographs: None Assessment:   1. Capsulitis of foot, left   2. Capsulitis of toe, left   3. Toe pain, left    Plan:  Patient was evaluated and treated and all questions answered.  Left third and fourth digit capsulitis of the metatarsophalangeal joint -I explained to the patient the etiology and various treatment associated with capsulitis of the metatarsophalangeal joint.  I believe patient will benefit from another steroid injection as it is continuing to help him with pain. -A steroid injection was performed at left third metatarsophalangeal joint and  left fourth metatarsophalangeal joint using 1% plain Lidocaine and 10 mg of Kenalog. This was well tolerated.   No follow-ups on file.

## 2019-12-07 ENCOUNTER — Other Ambulatory Visit: Payer: Self-pay

## 2019-12-07 ENCOUNTER — Ambulatory Visit (INDEPENDENT_AMBULATORY_CARE_PROVIDER_SITE_OTHER): Payer: Medicare Other | Admitting: Podiatry

## 2019-12-07 DIAGNOSIS — G5762 Lesion of plantar nerve, left lower limb: Secondary | ICD-10-CM | POA: Diagnosis not present

## 2019-12-07 DIAGNOSIS — G5782 Other specified mononeuropathies of left lower limb: Secondary | ICD-10-CM

## 2019-12-07 MED ORDER — HYDROCODONE-ACETAMINOPHEN 10-325 MG PO TABS: 1.0000 | ORAL_TABLET | Freq: Three times a day (TID) | ORAL | 0 refills | Status: AC | PRN

## 2019-12-10 NOTE — Progress Notes (Signed)
He presents today for follow-up of neuroma third interspace of the left foot states that the injection that Dr. Posey Pronto gave him last time really did not do very much he says that the dehydrated alcohol seem to be working a little bit better particularly to the toes.  Objective: Vital signs are stable he is alert and oriented x3.  Pulses are palpable.  There is no erythema edema cellulitis drainage or odor he has pain on palpation to the third interdigital space of the left foot where there is a stump neuroma present.  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  Plan: Injected around the toes today with 2 cc of 4% dehydrated alcohol and local anesthetic tolerated procedure well without complications provided him with a prescription for short-term narcotic narcotic and will follow-up with him in 4 weeks.

## 2019-12-28 ENCOUNTER — Ambulatory Visit (INDEPENDENT_AMBULATORY_CARE_PROVIDER_SITE_OTHER): Payer: Medicare Other | Admitting: Podiatry

## 2019-12-28 ENCOUNTER — Encounter: Payer: Self-pay | Admitting: Podiatry

## 2019-12-28 ENCOUNTER — Other Ambulatory Visit: Payer: Self-pay

## 2019-12-28 DIAGNOSIS — G5762 Lesion of plantar nerve, left lower limb: Secondary | ICD-10-CM

## 2019-12-28 DIAGNOSIS — G5782 Other specified mononeuropathies of left lower limb: Secondary | ICD-10-CM

## 2019-12-28 MED ORDER — OXYCODONE-ACETAMINOPHEN 10-325 MG PO TABS: 1.0000 | ORAL_TABLET | Freq: Three times a day (TID) | ORAL | 0 refills | Status: DC | PRN

## 2019-12-28 NOTE — Progress Notes (Signed)
Russell [name] presents today for follow-up of neuroma third interspace of the left foot states that even my hip is starting to hurt now.  Objective: Vital signs are stable he is alert oriented x3 still has palpable pain to the third interspace of the left foot appears to be less than previously noted.  Assessment: Neuroma third interspace.  Plan: Injected another dose of dehydrated alcohol after local anesthetic and a Betadine skin prep.  Tolerated procedure well follow-up with him 3 to 4 weeks

## 2020-01-18 ENCOUNTER — Other Ambulatory Visit: Payer: Self-pay

## 2020-01-18 ENCOUNTER — Ambulatory Visit (INDEPENDENT_AMBULATORY_CARE_PROVIDER_SITE_OTHER): Payer: Medicare Other | Admitting: Podiatry

## 2020-01-18 ENCOUNTER — Encounter: Payer: Self-pay | Admitting: Podiatry

## 2020-01-18 DIAGNOSIS — G5762 Lesion of plantar nerve, left lower limb: Secondary | ICD-10-CM

## 2020-01-18 DIAGNOSIS — G5782 Other specified mononeuropathies of left lower limb: Secondary | ICD-10-CM

## 2020-01-18 MED ORDER — OXYCODONE-ACETAMINOPHEN 10-325 MG PO TABS: 1.0000 | ORAL_TABLET | Freq: Three times a day (TID) | ORAL | 0 refills | Status: DC | PRN

## 2020-01-18 NOTE — Progress Notes (Signed)
He presents today for follow-up of his neuroma third interspace of the left foot.  States that which we can just cut this toe off.  Objective: Vital signs are stable he is alert and oriented x3 I reminded him that his circulation is very poor and that we have few options as far as treating this foot other than the dehydrated alcohol at this point.  I explained to him that we cannot continue to provide cortisone injections because of the deterioration of the tissues.  And I was concerned that we would lose his foot if we did any kind of surgery at all.  Assessment: Chronic intractable neuroma third interspace left.  Plan: Continue dehydrated alcohol injection after sterile Betadine skin prep a total of 2 cc of 4% dehydrated alcohol was injected he tolerated procedure well provided him with narcotic 25 tablets 1 every 8 hours as needed for pain.

## 2020-01-31 ENCOUNTER — Encounter (HOSPITAL_COMMUNITY): Payer: Self-pay | Admitting: Emergency Medicine

## 2020-01-31 ENCOUNTER — Other Ambulatory Visit: Payer: Self-pay

## 2020-01-31 ENCOUNTER — Emergency Department (HOSPITAL_COMMUNITY)
Admission: EM | Admit: 2020-01-31 | Discharge: 2020-01-31 | Disposition: A | Discharge status: Home / Self Care | Payer: Medicare Other | Attending: Emergency Medicine | Admitting: Emergency Medicine

## 2020-01-31 ENCOUNTER — Emergency Department (HOSPITAL_COMMUNITY): Payer: Medicare Other

## 2020-01-31 DIAGNOSIS — I10 Essential (primary) hypertension: Secondary | ICD-10-CM | POA: Diagnosis not present

## 2020-01-31 DIAGNOSIS — J40 Bronchitis, not specified as acute or chronic: Secondary | ICD-10-CM | POA: Diagnosis not present

## 2020-01-31 DIAGNOSIS — Z8546 Personal history of malignant neoplasm of prostate: Secondary | ICD-10-CM | POA: Insufficient documentation

## 2020-01-31 DIAGNOSIS — J449 Chronic obstructive pulmonary disease, unspecified: Secondary | ICD-10-CM | POA: Diagnosis not present

## 2020-01-31 DIAGNOSIS — R05 Cough: Secondary | ICD-10-CM | POA: Diagnosis present

## 2020-01-31 DIAGNOSIS — F17210 Nicotine dependence, cigarettes, uncomplicated: Secondary | ICD-10-CM | POA: Insufficient documentation

## 2020-01-31 DIAGNOSIS — Z20822 Contact with and (suspected) exposure to covid-19: Secondary | ICD-10-CM | POA: Insufficient documentation

## 2020-01-31 DIAGNOSIS — Z79899 Other long term (current) drug therapy: Secondary | ICD-10-CM | POA: Diagnosis not present

## 2020-01-31 DIAGNOSIS — R059 Cough, unspecified: Secondary | ICD-10-CM

## 2020-01-31 LAB — POC SARS CORONAVIRUS 2 AG -  ED: SARS Coronavirus 2 Ag: NEGATIVE

## 2020-01-31 MED ORDER — ALBUTEROL SULFATE HFA 108 (90 BASE) MCG/ACT IN AERS
2.0000 | INHALATION_SPRAY | Freq: Once | RESPIRATORY_TRACT | Status: AC
  Administered 2020-01-31: 23:00:00 2 via RESPIRATORY_TRACT
  Filled 2020-01-31: qty 6.7

## 2020-01-31 MED ORDER — AEROCHAMBER PLUS FLO-VU LARGE MISC
1.0000 | Freq: Once | Status: AC
  Administered 2020-01-31: 1

## 2020-01-31 MED ORDER — AZITHROMYCIN 250 MG PO TABS: 250.0000 mg | ORAL_TABLET | Freq: Every day | ORAL | 0 refills | Status: DC

## 2020-01-31 MED ORDER — AZITHROMYCIN 250 MG PO TABS
500.0000 mg | ORAL_TABLET | Freq: Once | ORAL | Status: AC
  Administered 2020-01-31: 500 mg via ORAL
  Filled 2020-01-31: qty 2

## 2020-01-31 NOTE — ED Notes (Signed)
Discharge instructions reviewed with pt. Pt verbalized understanding.   

## 2020-01-31 NOTE — ED Notes (Signed)
Patient refusing second covid test despite explanation of importance for verification prior POC result. Dr. Johnney Killian was made aware.

## 2020-01-31 NOTE — Discharge Instructions (Addendum)
1.  You have been given a dose of Zithromax in emergency department.  Fill the prescription and take 1 tablet daily for the next 4 days.  You have been given an albuterol inhaler.  Use 2 puffs every 4 hours for coughing and wheezing as needed. 2.  You have had a Covid test with a first rapid test that was negative.  A test that is more sensitive and takes longer has been done as well.  Follow the instructions for isolating and waiting for your test results. 3.  See your family doctor for recheck within the next 3 to 7 days. 4.  Return to the emergency department if you have worsening symptoms. 5.  Quit smoking.

## 2020-01-31 NOTE — ED Notes (Signed)
Patient refused second covid test

## 2020-01-31 NOTE — ED Provider Notes (Signed)
[facility] EMERGENCY DEPARTMENT Provider Note   CSN: [id number] Arrival date & time: 01/31/20  1806     History Chief Complaint  Patient presents with  . Cough    Russell [name] is a 79 y.o. male.  HPI Patient reports had a dry cough for about 2 to 3 days.  He reports he coughs but he feels like he is got something he needs to bring up and it just does not quite come up.  No chest pain.  No mucus or bloody sputum.  Patient denies he feels significantly more short of breath than at baseline.  He has been a long-term smoker.  Swelling of the legs.  No pain in the calves.  No myalgias, body ache or fever.  Patient denies he has had nasal congestion sore throat nausea or vomiting.  He denies any known Covid contact.    Past Medical History:  Diagnosis Date  . AKI (acute kidney injury) ([place]) 06/23/2017  . Arthritis    SHOUDLERS, LUMBAR  . BPH with obstruction/lower urinary tract symptoms   . BPH with urinary obstruction 01/15/2016  . COPD (chronic obstructive pulmonary disease) ([place])   . Coronary atherosclerosis    denied knowledge of this 10/27/16  . Depression   . Diverticulosis of colon   . Dyspnea   . ED (erectile dysfunction)   . Full dentures   . GERD (gastroesophageal reflux disease)    not current  . Glaucoma   . History of gastritis   . History of scabies    2008  . 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 History of syncope    03-22-2014  DX  VAGAL RESPONSE  . Hyperlipidemia, mixed   . Hypertension   . Mitral regurgitation   . Peripheral arterial disease ([place])    one vessel runoff bilaterally via peroneal arteries  . Prostate cancer ([place])   . Seizures ([place]) 03/2014   ? or syncope- patient refused to be admitted for further studies- "felt better"  . Type 2 diabetes, diet controlled ([place])    patient and his wife said no- have not been told 01/21/17  . Wears glasses     Patient Active Problem List   Diagnosis Date  Noted  . Osteoarthritis 05/31/2018  . Foot pain, left 05/11/2018  . Chest pain 06/23/2017  . AKI (acute kidney injury) ([place]) 06/23/2017  . Acute respiratory insufficiency   . Surgery, elective   . Radiculopathy 01/27/2017  . Cervical radiculopathy 01/27/2017  . 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  . 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 Peripheral arterial disease ([place]) 01/23/2016  . SLAC (scapholunate advanced collapse) of wrist 11/13/2015  . Fever 02/22/2015  . Epididymitis, left 02/22/2015  . Cellulitis 02/22/2015  . Orchitis 02/22/2015  . Testicular abscess 02/22/2015  . DM type 2 (diabetes mellitus, type 2) ([place]) 02/22/2015  . Microcytic anemia 02/22/2015  . Epididymitis 02/22/2015  . UTI (lower urinary tract infection) 02/22/2015  . Hypotension 03/22/2014  . Neuroma digital nerve 09/28/2013  . 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 Dehydration 07/03/2010  . NAUSEA AND VOMITING 07/03/2010  . BENIGN PROSTATIC HYPERTROPHY, WITH URINARY OBSTRUCTION 06/13/2010  . ARM PAIN 06/03/2010  . DYSPNEA ON EXERTION 05/15/2010  . TOBACCO ABUSE 04/03/2010  . COPD 04/03/2010  . G E R D 04/03/2010  . OTHER DYSPHAGIA 04/03/2010  . PNEUMONIA, RIGHT LOWER LOBE 02/19/2010  .  DIABETES MELLITUS, TYPE II, CONTROLLED 01/14/2010  . LUMBAR RADICULOPATHY, LEFT 05/16/2009  . DYSPHAGIA UNSPECIFIED 05/16/2009  . COUGH DUE TO ACE INHIBITORS 04/02/2009  . DIZZINESS 07/27/2008  . HYPERCHOLESTEROLEMIA, MIXED 05/11/2008  . CATARACTS, BILATERAL 03/01/2008  . MILD SPINAL STENOSIS, CERVICAL 01/12/2008  . LEG CRAMPS 01/12/2008  . EARLY SATIETY 11/04/2007  . DIVERTICULOSIS, COLON 08/31/2007  . SCABIES 08/26/2007  . ERECTILE DYSFUNCTION 08/18/2007  . ABUSE, ALCOHOL SUSPECTED 08/18/2007  . Essential hypertension 08/17/2007  . HEMOCCULT POSITIVE STOOL 07/05/2007  . ANEMIA, IRON DEFICIENCY NOS 05/31/2007  . WEIGHT LOSS, ABNORMAL 05/10/2006     Past Surgical History:  Procedure Laterality Date  . ANTERIOR CERVICAL DECOMP/DISCECTOMY FUSION  10-16-2009   C4 -- C6  . ANTERIOR CERVICAL DECOMP/DISCECTOMY FUSION N/A 01/27/2017   Procedure: ANTERIOR CERVICAL DECOMPRESSION FUSION CERVICAL 3-4 WITH INSTRUMENTATION AND ALLOGRAFT;  Surgeon: Phylliss Bob, MD;  Location: [place];  Service: Orthopedics;  Laterality: N/A;  ANTERIOR CERVICAL DECOMPRESSION FUSION CERVICAL 3-4 WITH INSTRUMENTATION AND ALLOGRAFT  . CAPSULOTOMY Right 01/26/2013   Procedure: MINOR CAPSULOTOMY;  Surgeon: Myrtha Mantis., MD;  Location: [place];  Service: Ophthalmology;  Laterality: Right;  . CARPAL TUNNEL RELEASE Left 01/02/2019   Procedure: LEFT CARPAL TUNNEL RELEASE;  Surgeon: Leanora Cover, MD;  Location: [place];  Service: Orthopedics;  Laterality: Left;  . CARPECTOMY Right 03/13/2014   Procedure: RIGHT  PROXIMAL ROW CARPECTOMY;  Surgeon: Tennis Must, MD;  Location: [place];  Service: Orthopedics;  Laterality: Right;  . CARPECTOMY Left 10/22/2015   Procedure: LEFT WRIST PROXIMAL ROW CARPECTOMY ;  Surgeon: Leanora Cover, MD;  Location: [place];  Service: Orthopedics;  Laterality: Left;  . CARPOMETACARPAL ([place]) FUSION OF THUMB Right 03/13/2014   Procedure: RIGHT FUSION OF THUMB CARPOMETACARPAL [facility]) JOINT;  Surgeon: Tennis Must, MD;  Location: [place];  Service: Orthopedics;  Laterality: Right;  . CATARACT EXTRACTION W/ INTRAOCULAR LENS  IMPLANT, BILATERAL  2009  . COLONOSCOPY  08-31-2007  . COLONOSCOPY    . CRYOABLATION N/A 01/14/2015   Procedure: CRYO ABLATION PROSTATE;  Surgeon: Ailene Rud, MD;  Location: [facility];  Service: Urology;  Laterality: N/A;  . ESOPHAGOGASTRODUODENOSCOPY  last one 09-11-2008  . EXCISION MASS LEFT FACE , SUBORBITAL AREA, PLASTER RECONSTRUCTION  02-09-2011  . EXCISIONAL DEBRIDEMENT AND REPAIR RIGHT QUADRICEP TENDON  07-05-2000  . FOOT  SURGERY Left   . I & D EXTREMITY Right 05/24/2013   Procedure: RIGHT INDEX WOUND DEBRIDEMENT AND CLOSURE;  Surgeon: Jolyn Nap, MD;  Location: [place];  Service: Orthopedics;  Laterality: Right;  . INSERTION OF SUPRAPUBIC CATHETER N/A 01/14/2015   Procedure: SUPRAPUBIC TUBE PLACEMENT;  Surgeon: Ailene Rud, MD;  [facility];  Service: Urology;  Laterality: N/A;  . KNEE ARTHROSCOPY Right 2008  . LARYNGOSCOPY AND ESOPHAGOSCOPY  06-13-2010   MARSUPIALIZATION LEFT LARGE VALLECULAR CYST  . MASS EXCISION Left 10/22/2015   Procedure: EXCISION MASS OF LEFT INDEX FINGER;  Surgeon: Leanora Cover, MD;  Location: [place];  Service: Orthopedics;  Laterality: Left;  . ORCHIECTOMY Left 02/24/2015   Procedure: SCROTAL EXPLORATION WITH LEFT ORCHIECTOMY;  Surgeon: Kathie Rhodes, MD;  Location: [place];  Service: Urology;  Laterality: Left;  . POSTERIOR CERVICAL FUSION/FORAMINOTOMY N/A 01/28/2017   Procedure: POSTERIOR SPINAL FUSION CERVICAL 3-4, CERVICAL 4-5, CERVICAL 5-6, CERVICAL 6-7 WITH INSTRUMENATION AND ALLOGRAFT;  Surgeon: Phylliss Bob, MD;  Location: [place];  Service:  Orthopedics;  Laterality: N/A;  POSTERIOR SPINAL FUSION CERVICAL 3-4, CERVICAL 4-5, CERVICAL 5-6, CERVICAL 6-7 WITH INSTRUMENATION AND ALLOGRAFT  . SHOULDER ARTHROSCOPY/ DEBRIDEMENT LABRAL TEAR/  BICEPS TENOTOMY Left 09-24-2011  . TONSILLECTOMY  as child  . TRANSTHORACIC ECHOCARDIOGRAM  02-21-2010   normal LVF/  ef 55-60%/ mild to moderate MR/  moderate LAE/  mild TR  . YAG LASER APPLICATION Right [id number]   Procedure: YAG LASER APPLICATION;  Surgeon: Myrtha Mantis., MD;  Location: [place];  Service: Ophthalmology;  Laterality: Right;  . YAG LASER CAPSULOTOMY, LEFT EYE  01-08-2011       Family History  Problem Relation Age of Onset  . Unexplained death Mother        died at a young age, no known cause  . Heart attack Father        age unknown  . Diabetes Other    . Cancer Other     Social History   Tobacco Use  . Smoking status: Current Every Day Smoker    Packs/day: 1.00    Years: 59.00    Pack years: 59.00    Types: Cigarettes  . Smokeless tobacco: Never Used  . Tobacco comment: Started Chantix 10/26/16  Substance Use Topics  . Alcohol use: No    Comment: none since approx 2014- heavy before  . Drug use: No    Home Medications Prior to Admission medications   Medication Sig Start Date End Date Taking? Authorizing Provider  amitriptyline (ELAVIL) 25 MG tablet TK 1 T PO HS 09/09/19   [provider]  azithromycin (ZITHROMAX) 250 MG tablet Take 1 tablet (250 mg total) by mouth daily. First dose given in the emergency department.  Take 1 tablet 1 every day until finished. 01/31/20   Charlesetta Shanks, MD  AZOPT 1 % ophthalmic suspension Place 1 drop into the left eye 3 (three) times daily.  12/25/16   [provider]  bimatoprost (LUMIGAN) 0.01 % SOLN Place 1 drop into both eyes at bedtime. 04/10/13   Dhungel, Flonnie Overman, MD  brimonidine (ALPHAGAN) 0.2 % ophthalmic solution INT 1 GTT IN OU BID 03/14/19   [provider]  brimonidine-timolol (COMBIGAN) 0.2-0.5 % ophthalmic solution Place 1 drop into both eyes 2 (two) times daily.     [provider]  CHANTIX 1 MG tablet  01/20/19   [provider]  diclofenac sodium (VOLTAREN) 1 % GEL Apply 2 g topically daily.  03/07/18   [provider]  esomeprazole (NEXIUM) 40 MG capsule Take 1 capsule (40 mg total) by mouth daily. Patient taking differently: Take 40 mg by mouth as needed.  07/16/17   Isaiah Serge, NP  ferrous sulfate 325 (65 FE) MG tablet Take 325 mg by mouth daily.     [provider]  gabapentin (NEURONTIN) 300 MG capsule TAKE 1 CAPSULE(300 MG) BY MOUTH TWICE DAILY 11/28/19   Narda Amber K, DO  hydrochlorothiazide (HYDRODIURIL) 12.5 MG tablet Take 12.5 mg by mouth daily.  07/28/18   [provider]  ketorolac (ACULAR) 0.5 %  ophthalmic solution  12/01/19   [provider]  losartan (COZAAR) 100 MG tablet Take 100 mg by mouth daily. 06/08/17   [provider]  meclizine (ANTIVERT) 25 MG tablet Take 25 mg by mouth daily. 06/16/17   [provider]  Multiple Vitamins-Minerals (CENTRUM SILVER ADULT 50+ PO) Take 1 tablet by mouth daily.    [provider]  Omega-3 Fatty Acids (FISH OIL) 1000 MG CPDR  Take 1,000 mg by mouth daily.    [provider]  oxybutynin (DITROPAN) 5 MG tablet Take 5 mg by mouth every morning. 11/13/19   [provider]  oxyCODONE-acetaminophen (PERCOCET) 10-325 MG tablet Take 1 tablet by mouth every 8 (eight) hours as needed for pain. 01/18/20   Hyatt, Max T, DPM  pravastatin (PRAVACHOL) 80 MG tablet Take 80 mg by mouth daily.  10/19/16   [provider]  pregabalin (LYRICA) 50 MG capsule Take by mouth. 02/17/19   [provider]  pregabalin (LYRICA) 50 MG capsule TAKE 1 CAPSULE(50 MG) BY MOUTH TWICE DAILY 11/28/19   [provider]  tamsulosin (FLOMAX) 0.4 MG CAPS capsule Take 0.4 mg by mouth at bedtime.  12/03/16   [provider]    Allergies    Patient has no known allergies.  Review of Systems   Review of Systems 10 Systems reviewed and are negative for acute change except as noted in the HPI. Physical Exam Updated Vital Signs BP (!) 161/86   Pulse 72   Temp 98 F (36.7 C) (Oral)   Resp 19   SpO2 98%   Physical Exam Constitutional:      Comments: Patient is alert and nontoxic.  Clinically well in appearance.  No respiratory distress at rest.  HENT:     Head: Normocephalic and atraumatic.     Mouth/Throat:     Pharynx: Oropharynx is clear.  Eyes:     Extraocular Movements: Extraocular movements intact.  Cardiovascular:     Rate and Rhythm: Normal rate and regular rhythm.  Pulmonary:     Comments: Symmetric breath sounds.  Bilateral expiratory wheeze in mid lung fields.  No respiratory  distress. Abdominal:     General: There is no distension.     Palpations: Abdomen is soft.     Tenderness: There is no abdominal tenderness. There is no guarding.  Musculoskeletal:        General: No swelling or tenderness. Normal range of motion.     Right lower leg: No edema.     Left lower leg: No edema.  Skin:    General: Skin is warm and dry.  Neurological:     General: No focal deficit present.     Mental Status: He is oriented to person, place, and time.     Coordination: Coordination normal.  Psychiatric:        Mood and Affect: Mood normal.     ED Results / Procedures / Treatments   Labs (all labs ordered are listed, but only abnormal results are displayed) Labs Reviewed  SARS CORONAVIRUS 2 (TAT 6-24 HRS)  POC SARS CORONAVIRUS 2 AG -  ED    EKG None  Radiology DG Chest Port 1 View  Result Date: 01/31/2020 CLINICAL DATA:  Cough EXAM: PORTABLE CHEST 1 VIEW COMPARISON:  01/14/2019 FINDINGS: Heart is upper limits normal in size. No confluent airspace opacities or effusions. No acute bony abnormality. Aortic atherosclerosis. IMPRESSION: No acute cardiopulmonary disease. Electronically Signed   By: Rolm Baptise M.D.   On: 01/31/2020 20:50    Procedures Procedures (including critical care time)  Medications Ordered in ED Medications  albuterol (VENTOLIN HFA) 108 (90 Base) MCG/ACT inhaler 2 puff (2 puffs Inhalation Given 01/31/20 2237)  AeroChamber Plus Flo-Vu Large MISC 1 each (1 each Other Given 01/31/20 2237)  azithromycin (ZITHROMAX) tablet 500 mg (500 mg Oral Given 01/31/20 2237)    ED Course  I have reviewed the triage vital signs and  the nursing notes.  Pertinent labs & imaging results that were available during my care of the patient were reviewed by me and considered in my medical decision making (see chart for details).    MDM Rules/Calculators/A&P                      Patient presents with 2 days worth of cough.  He does have a long-term smoking  history.  No fever, no myalgia, no chest pain. No Lower extremity swelling or calf pain.  At this time chest x-ray is clear and patient has 100% oxygenation on room air.  Rapid Covid testing is negative.  Patient is counseled on awaiting results of follow-up PCR test.  With long smoking history and symptoms of bronchitis will initiate on Zithromax and inhalers.  Return precautions are reviewed.  Patient is counseled to follow-up with his PCP within 3 to 7 days.  GLADE SAN was evaluated in Emergency Department on 01/31/2020 for the symptoms described in the history of present illness. He was evaluated in the context of the global COVID-19 pandemic, which necessitated consideration that the patient might be at risk for infection with the SARS-CoV-2 virus that causes COVID-19. Institutional protocols and algorithms that pertain to the evaluation of patients at risk for COVID-19 are in a state of rapid change based on information released by regulatory bodies including the CDC and federal and state organizations. These policies and algorithms were followed during the patient's care in the ED. Final Clinical Impression(s) / ED Diagnoses Final diagnoses:  Bronchitis    Rx / DC Orders ED Discharge Orders         Ordered    azithromycin (ZITHROMAX) 250 MG tablet  Daily     01/31/20 2237           Charlesetta Shanks, MD 01/31/20 2243

## 2020-01-31 NOTE — ED Notes (Signed)
Pt called out demanding a nurse or doctor, tech asked what she could help with, pt stated "I need some coffee and no one comes in here". Tech asked MD-Pfeiffer if it was ok and she stated "yes". Tech got him coffee and assortment of condiments for coffee

## 2020-01-31 NOTE — ED Triage Notes (Signed)
Pt c/o cough x 2 days. Denies fever/shortness of breath/chest pain. No known covid contacts. Hx smoking.

## 2020-02-08 ENCOUNTER — Ambulatory Visit: Payer: Medicare Other | Admitting: Podiatry

## 2020-02-09 ENCOUNTER — Other Ambulatory Visit: Payer: Self-pay | Admitting: Podiatry

## 2020-02-09 MED ORDER — OXYCODONE-ACETAMINOPHEN 10-325 MG PO TABS: 1.0000 | ORAL_TABLET | Freq: Three times a day (TID) | ORAL | 0 refills | Status: DC | PRN

## 2020-02-09 NOTE — Telephone Encounter (Signed)
Pt was calling to get pain medication for his toes/ he was scheduled for Thursday but he is in a lot of pain

## 2020-02-09 NOTE — Addendum Note (Signed)
Addended by: Rip Harbour on: 02/09/2020 04:21 PM   Modules accepted: Orders

## 2020-02-09 NOTE — Telephone Encounter (Signed)
Hey Dr Milinda Pointer, can you please advise. Thanks Lattie Haw

## 2020-02-29 ENCOUNTER — Other Ambulatory Visit: Payer: Self-pay | Admitting: Orthopedic Surgery

## 2020-03-05 ENCOUNTER — Ambulatory Visit: Payer: Medicare Other | Admitting: Podiatry

## 2020-03-05 ENCOUNTER — Telehealth: Payer: Self-pay | Admitting: Podiatry

## 2020-03-05 ENCOUNTER — Other Ambulatory Visit: Payer: Self-pay | Admitting: Podiatry

## 2020-03-05 MED ORDER — OXYCODONE-ACETAMINOPHEN 10-325 MG PO TABS: 1.0000 | ORAL_TABLET | Freq: Three times a day (TID) | ORAL | 0 refills | Status: AC | PRN

## 2020-03-05 NOTE — Telephone Encounter (Signed)
Pt came in for appt today 30 min late. Unable to reschd 4/6. Would likea refill on pain medication. Please call pt.

## 2020-03-05 NOTE — Telephone Encounter (Signed)
I took care of it. Sent it to Pharmacy.

## 2020-03-26 ENCOUNTER — Other Ambulatory Visit: Payer: Self-pay

## 2020-03-26 ENCOUNTER — Encounter: Payer: Self-pay | Admitting: Podiatry

## 2020-03-26 ENCOUNTER — Ambulatory Visit (INDEPENDENT_AMBULATORY_CARE_PROVIDER_SITE_OTHER): Payer: Medicare Other | Admitting: Podiatry

## 2020-03-26 DIAGNOSIS — M79676 Pain in unspecified toe(s): Secondary | ICD-10-CM | POA: Diagnosis not present

## 2020-03-26 DIAGNOSIS — G5762 Lesion of plantar nerve, left lower limb: Secondary | ICD-10-CM | POA: Diagnosis not present

## 2020-03-26 DIAGNOSIS — B351 Tinea unguium: Secondary | ICD-10-CM

## 2020-03-26 DIAGNOSIS — G5782 Other specified mononeuropathies of left lower limb: Secondary | ICD-10-CM

## 2020-03-26 MED ORDER — OXYCODONE-ACETAMINOPHEN 10-325 MG PO TABS: 1.0000 | ORAL_TABLET | ORAL | 0 refills | Status: DC | PRN

## 2020-03-26 NOTE — Progress Notes (Signed)
He presents today for follow-up of his neuroma third interdigital space left foot states that starting to hurt into the second interdigital space as well.  He also goes on to say that he is going to have hand surgery because he feels that without it he will never be able to get rid of the pain in his arm.  He states that he is having that done next Wednesday a week from tomorrow.  States that he does not have enough strength in his hands to cut his toenails and they are painful.  Objective: Vital signs are stable he is alert and oriented x3 pulses remain nonpalpable though he does have venous distention to the bilateral lower extremities.  He has pain on palpation to the third and second interdigital spaces at the level of the metatarsophalangeal joints and just proximal to that area..  His toenails are long thick yellow dystrophic-like mycotic.  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  Pain in limb secondary to onychomycosis.  Plan: Debridement of toenails 1 through 5 bilaterally.  I injected 1 cc of dehydrated alcohol to the second and third interdigital space after sterile Betadine skin prep.  He tolerated procedure well without complications.

## 2020-03-28 ENCOUNTER — Encounter (HOSPITAL_BASED_OUTPATIENT_CLINIC_OR_DEPARTMENT_OTHER): Payer: Self-pay | Admitting: Orthopedic Surgery

## 2020-03-28 ENCOUNTER — Other Ambulatory Visit: Payer: Self-pay

## 2020-04-01 ENCOUNTER — Encounter (HOSPITAL_BASED_OUTPATIENT_CLINIC_OR_DEPARTMENT_OTHER)
Admission: RE | Admit: 2020-04-01 | Discharge: 2020-04-01 | Disposition: A | Discharge status: Home / Self Care | Payer: Medicare Other | Source: Ambulatory Visit | Attending: Orthopedic Surgery | Admitting: Orthopedic Surgery

## 2020-04-01 ENCOUNTER — Other Ambulatory Visit (HOSPITAL_COMMUNITY)
Admission: RE | Admit: 2020-04-01 | Discharge: 2020-04-01 | Disposition: A | Discharge status: Home / Self Care | Payer: Medicare Other | Source: Ambulatory Visit | Attending: Orthopedic Surgery | Admitting: Orthopedic Surgery

## 2020-04-01 ENCOUNTER — Other Ambulatory Visit: Payer: Self-pay

## 2020-04-01 DIAGNOSIS — Z20822 Contact with and (suspected) exposure to covid-19: Secondary | ICD-10-CM | POA: Diagnosis not present

## 2020-04-01 DIAGNOSIS — Z01812 Encounter for preprocedural laboratory examination: Secondary | ICD-10-CM | POA: Diagnosis not present

## 2020-04-01 LAB — BASIC METABOLIC PANEL
Anion gap: 11 (ref 5–15)
BUN: 21 mg/dL (ref 8–23)
CO2: 24 mmol/L (ref 22–32)
Calcium: 9.5 mg/dL (ref 8.9–10.3)
Chloride: 104 mmol/L (ref 98–111)
Creatinine, Ser: 1.5 mg/dL — ABNORMAL HIGH (ref 0.61–1.24)
GFR calc Af Amer: 51 mL/min — ABNORMAL LOW (ref >60)
GFR calc non Af Amer: 44 mL/min — ABNORMAL LOW (ref >60)
Glucose, Bld: 103 mg/dL — ABNORMAL HIGH (ref 70–99)
Potassium: 4.2 mmol/L (ref 3.5–5.1)
Sodium: 139 mmol/L (ref 135–145)

## 2020-04-01 LAB — SARS CORONAVIRUS 2 (TAT 6-24 HRS): SARS Coronavirus 2: NEGATIVE

## 2020-04-01 NOTE — Progress Notes (Signed)

## 2020-04-04 ENCOUNTER — Ambulatory Visit (HOSPITAL_BASED_OUTPATIENT_CLINIC_OR_DEPARTMENT_OTHER): Payer: Medicare Other | Admitting: Anesthesiology

## 2020-04-04 ENCOUNTER — Encounter (HOSPITAL_BASED_OUTPATIENT_CLINIC_OR_DEPARTMENT_OTHER)
Admission: RE | Disposition: A | Discharge status: Home / Self Care | Payer: Self-pay | Source: Home / Self Care | Attending: Orthopedic Surgery

## 2020-04-04 ENCOUNTER — Ambulatory Visit (HOSPITAL_BASED_OUTPATIENT_CLINIC_OR_DEPARTMENT_OTHER)
Admission: RE | Admit: 2020-04-04 | Discharge: 2020-04-04 | Disposition: A | Discharge status: Home / Self Care | Payer: Medicare Other | Attending: Orthopedic Surgery | Admitting: Orthopedic Surgery

## 2020-04-04 ENCOUNTER — Encounter (HOSPITAL_BASED_OUTPATIENT_CLINIC_OR_DEPARTMENT_OTHER): Payer: Self-pay | Admitting: Orthopedic Surgery

## 2020-04-04 ENCOUNTER — Other Ambulatory Visit: Payer: Self-pay

## 2020-04-04 DIAGNOSIS — K219 Gastro-esophageal reflux disease without esophagitis: Secondary | ICD-10-CM | POA: Insufficient documentation

## 2020-04-04 DIAGNOSIS — E782 Mixed hyperlipidemia: Secondary | ICD-10-CM | POA: Insufficient documentation

## 2020-04-04 DIAGNOSIS — F17210 Nicotine dependence, cigarettes, uncomplicated: Secondary | ICD-10-CM | POA: Insufficient documentation

## 2020-04-04 DIAGNOSIS — M479 Spondylosis, unspecified: Secondary | ICD-10-CM | POA: Diagnosis not present

## 2020-04-04 DIAGNOSIS — I2510 Atherosclerotic heart disease of native coronary artery without angina pectoris: Secondary | ICD-10-CM | POA: Insufficient documentation

## 2020-04-04 DIAGNOSIS — Z9079 Acquired absence of other genital organ(s): Secondary | ICD-10-CM | POA: Insufficient documentation

## 2020-04-04 DIAGNOSIS — Z8249 Family history of ischemic heart disease and other diseases of the circulatory system: Secondary | ICD-10-CM | POA: Diagnosis not present

## 2020-04-04 DIAGNOSIS — Z8546 Personal history of malignant neoplasm of prostate: Secondary | ICD-10-CM | POA: Insufficient documentation

## 2020-04-04 DIAGNOSIS — I739 Peripheral vascular disease, unspecified: Secondary | ICD-10-CM | POA: Diagnosis not present

## 2020-04-04 DIAGNOSIS — M19012 Primary osteoarthritis, left shoulder: Secondary | ICD-10-CM | POA: Diagnosis not present

## 2020-04-04 DIAGNOSIS — J449 Chronic obstructive pulmonary disease, unspecified: Secondary | ICD-10-CM | POA: Insufficient documentation

## 2020-04-04 DIAGNOSIS — I10 Essential (primary) hypertension: Secondary | ICD-10-CM | POA: Diagnosis not present

## 2020-04-04 DIAGNOSIS — H409 Unspecified glaucoma: Secondary | ICD-10-CM | POA: Diagnosis not present

## 2020-04-04 DIAGNOSIS — E119 Type 2 diabetes mellitus without complications: Secondary | ICD-10-CM | POA: Insufficient documentation

## 2020-04-04 DIAGNOSIS — Z79899 Other long term (current) drug therapy: Secondary | ICD-10-CM | POA: Insufficient documentation

## 2020-04-04 DIAGNOSIS — M19011 Primary osteoarthritis, right shoulder: Secondary | ICD-10-CM | POA: Diagnosis not present

## 2020-04-04 DIAGNOSIS — M1812 Unilateral primary osteoarthritis of first carpometacarpal joint, left hand: Secondary | ICD-10-CM | POA: Diagnosis present

## 2020-04-04 DIAGNOSIS — Z981 Arthrodesis status: Secondary | ICD-10-CM | POA: Diagnosis not present

## 2020-04-04 HISTORY — PX: CARPOMETACARPEL SUSPENSION PLASTY: SHX5005

## 2020-04-04 SURGERY — CARPOMETACARPEL (CMC) SUSPENSION PLASTY
Anesthesia: Monitor Anesthesia Care | Site: Hand | Laterality: Left

## 2020-04-04 MED ORDER — LIDOCAINE 2% (20 MG/ML) 5 ML SYRINGE
INTRAMUSCULAR | Status: AC
  Filled 2020-04-04: qty 5

## 2020-04-04 MED ORDER — HYDROMORPHONE HCL 1 MG/ML IJ SOLN: 0.2500 mg | INTRAMUSCULAR | Status: DC | PRN

## 2020-04-04 MED ORDER — LIDOCAINE HCL (PF) 1 % IJ SOLN
INTRAMUSCULAR | Status: AC
  Filled 2020-04-04: qty 30

## 2020-04-04 MED ORDER — OXYCODONE HCL 5 MG PO TABS: 5.0000 mg | ORAL_TABLET | Freq: Once | ORAL | Status: DC | PRN

## 2020-04-04 MED ORDER — LACTATED RINGERS IV SOLN: INTRAVENOUS | Status: DC

## 2020-04-04 MED ORDER — CEFAZOLIN SODIUM-DEXTROSE 2-4 GM/100ML-% IV SOLN
INTRAVENOUS | Status: AC
  Filled 2020-04-04: qty 100

## 2020-04-04 MED ORDER — CEFAZOLIN SODIUM-DEXTROSE 2-4 GM/100ML-% IV SOLN
2.0000 g | INTRAVENOUS | Status: AC
  Administered 2020-04-04: 08:00:00 2 g via INTRAVENOUS

## 2020-04-04 MED ORDER — PROMETHAZINE HCL 25 MG/ML IJ SOLN: 6.2500 mg | INTRAMUSCULAR | Status: DC | PRN

## 2020-04-04 MED ORDER — MIDAZOLAM HCL 2 MG/2ML IJ SOLN
1.0000 mg | INTRAMUSCULAR | Status: DC | PRN
  Administered 2020-04-04: 1 mg via INTRAVENOUS

## 2020-04-04 MED ORDER — OXYCODONE HCL 5 MG/5ML PO SOLN: 5.0000 mg | Freq: Once | ORAL | Status: DC | PRN

## 2020-04-04 MED ORDER — BUPIVACAINE HCL (PF) 0.25 % IJ SOLN
INTRAMUSCULAR | Status: AC
  Filled 2020-04-04: qty 30

## 2020-04-04 MED ORDER — FENTANYL CITRATE (PF) 100 MCG/2ML IJ SOLN
50.0000 ug | INTRAMUSCULAR | Status: DC | PRN
  Administered 2020-04-04: 50 ug via INTRAVENOUS

## 2020-04-04 MED ORDER — ROPIVACAINE HCL 5 MG/ML IJ SOLN
INTRAMUSCULAR | Status: DC | PRN
  Administered 2020-04-04: 30 mL via PERINEURAL

## 2020-04-04 MED ORDER — PROPOFOL 500 MG/50ML IV EMUL
INTRAVENOUS | Status: AC
  Filled 2020-04-04: qty 50

## 2020-04-04 MED ORDER — PROPOFOL 500 MG/50ML IV EMUL
INTRAVENOUS | Status: DC | PRN
  Administered 2020-04-04: 100 ug/kg/min via INTRAVENOUS

## 2020-04-04 MED ORDER — CHLORHEXIDINE GLUCONATE 4 % EX LIQD: 60.0000 mL | Freq: Once | CUTANEOUS | Status: DC

## 2020-04-04 MED ORDER — MIDAZOLAM HCL 2 MG/2ML IJ SOLN
INTRAMUSCULAR | Status: AC
  Filled 2020-04-04: qty 2

## 2020-04-04 MED ORDER — LIDOCAINE 2% (20 MG/ML) 5 ML SYRINGE
INTRAMUSCULAR | Status: DC | PRN
  Administered 2020-04-04 (×3): 20 mg via INTRAVENOUS

## 2020-04-04 MED ORDER — FENTANYL CITRATE (PF) 100 MCG/2ML IJ SOLN
INTRAMUSCULAR | Status: DC | PRN
  Administered 2020-04-04 (×2): 50 ug via INTRAVENOUS

## 2020-04-04 MED ORDER — OXYCODONE-ACETAMINOPHEN 5-325 MG PO TABS: ORAL_TABLET | ORAL | 0 refills | Status: DC

## 2020-04-04 MED ORDER — FENTANYL CITRATE (PF) 100 MCG/2ML IJ SOLN
INTRAMUSCULAR | Status: AC
  Filled 2020-04-04: qty 2

## 2020-04-04 SURGICAL SUPPLY — 83 items
APL PRP STRL LF DISP 70% ISPRP (MISCELLANEOUS) ×1
BIT DRILL PASSING CMC 1/4 FLEX (BIT) IMPLANT
BLADE MINI RND TIP GREEN BEAV (BLADE) ×3 IMPLANT
BLADE SURG 15 STRL LF DISP TIS (BLADE) ×2 IMPLANT
BLADE SURG 15 STRL SS (BLADE) ×6
BNDG CMPR 9X4 STRL LF SNTH (GAUZE/BANDAGES/DRESSINGS) ×1
BNDG ELASTIC 2X5.8 VLCR STR LF (GAUZE/BANDAGES/DRESSINGS) IMPLANT
BNDG ELASTIC 3X5.8 VLCR STR LF (GAUZE/BANDAGES/DRESSINGS) ×2 IMPLANT
BNDG ESMARK 4X9 LF (GAUZE/BANDAGES/DRESSINGS) ×3 IMPLANT
BNDG GAUZE ELAST 4 BULKY (GAUZE/BANDAGES/DRESSINGS) ×3 IMPLANT
BUTTON ALL-SUT W/BACKSTOP (Orthopedic Implant) ×2 IMPLANT
CHLORAPREP W/TINT 26 (MISCELLANEOUS) ×3 IMPLANT
CORD BIPOLAR FORCEPS 12FT (ELECTRODE) ×3 IMPLANT
COVER BACK TABLE 60X90IN (DRAPES) ×3 IMPLANT
COVER MAYO STAND STRL (DRAPES) ×3 IMPLANT
COVER SURGICAL LIGHT HANDLE (MISCELLANEOUS) ×2 IMPLANT
COVER WAND RF STERILE (DRAPES) IMPLANT
CUFF TOURN SGL QUICK 18X4 (TOURNIQUET CUFF) ×3 IMPLANT
DECANTER SPIKE VIAL GLASS SM (MISCELLANEOUS) ×2 IMPLANT
DRAPE EXTREMITY T 121X128X90 (DISPOSABLE) ×3 IMPLANT
DRAPE OEC MINIVIEW 54X84 (DRAPES) ×2 IMPLANT
DRAPE SURG 17X23 STRL (DRAPES) ×3 IMPLANT
DRILL PASSING CMC 1/4 FLEX (BIT) ×3
DRSG PAD ABDOMINAL 8X10 ST (GAUZE/BANDAGES/DRESSINGS) IMPLANT
GAUZE SPONGE 4X4 12PLY STRL (GAUZE/BANDAGES/DRESSINGS) ×3 IMPLANT
GAUZE XEROFORM 1X8 LF (GAUZE/BANDAGES/DRESSINGS) ×3 IMPLANT
GLOVE BIO SURGEON STRL SZ7.5 (GLOVE) ×3 IMPLANT
GLOVE BIOGEL PI IND STRL 6.5 (GLOVE) IMPLANT
GLOVE BIOGEL PI IND STRL 7.5 (GLOVE) IMPLANT
GLOVE BIOGEL PI IND STRL 8 (GLOVE) ×1 IMPLANT
GLOVE BIOGEL PI IND STRL 8.5 (GLOVE) IMPLANT
GLOVE BIOGEL PI INDICATOR 6.5 (GLOVE) ×2
GLOVE BIOGEL PI INDICATOR 7.5 (GLOVE) ×2
GLOVE BIOGEL PI INDICATOR 8 (GLOVE) ×4
GLOVE BIOGEL PI INDICATOR 8.5 (GLOVE)
GLOVE ECLIPSE 8.0 STRL XLNG CF (GLOVE) ×2 IMPLANT
GLOVE SURG ORTHO 8.0 STRL STRW (GLOVE) IMPLANT
GLOVE SURG SS PI 7.0 STRL IVOR (GLOVE) ×2 IMPLANT
GOWN STRL REUS W/ TWL LRG LVL3 (GOWN DISPOSABLE) ×1 IMPLANT
GOWN STRL REUS W/TWL LRG LVL3 (GOWN DISPOSABLE) ×6
GOWN STRL REUS W/TWL XL LVL3 (GOWN DISPOSABLE) ×5 IMPLANT
K-WIRE .035X4 (WIRE) IMPLANT
NDL HYPO 25X1 1.5 SAFETY (NEEDLE) IMPLANT
NDL KEITH (NEEDLE) IMPLANT
NDL SAFETY ECLIPSE 18X1.5 (NEEDLE) IMPLANT
NEEDLE HYPO 18GX1.5 SHARP (NEEDLE)
NEEDLE HYPO 22GX1.5 SAFETY (NEEDLE) ×2 IMPLANT
NEEDLE HYPO 25X1 1.5 SAFETY (NEEDLE) IMPLANT
NEEDLE KEITH (NEEDLE) IMPLANT
NS IRRIG 1000ML POUR BTL (IV SOLUTION) ×3 IMPLANT
PACK BASIN DAY SURGERY FS (CUSTOM PROCEDURE TRAY) ×3 IMPLANT
PAD CAST 3X4 CTTN HI CHSV (CAST SUPPLIES) ×1 IMPLANT
PAD CAST 4YDX4 CTTN HI CHSV (CAST SUPPLIES) IMPLANT
PADDING CAST ABS 4INX4YD NS (CAST SUPPLIES) ×2
PADDING CAST ABS COTTON 4X4 ST (CAST SUPPLIES) ×1 IMPLANT
PADDING CAST COTTON 3X4 STRL (CAST SUPPLIES) ×3
PADDING CAST COTTON 4X4 STRL (CAST SUPPLIES)
PASSER SUT SWANSON 36MM LOOP (INSTRUMENTS) IMPLANT
SHEET MEDIUM DRAPE 40X70 STRL (DRAPES) ×2 IMPLANT
SLEEVE SCD COMPRESS KNEE MED (MISCELLANEOUS) ×2 IMPLANT
SLING ARM FOAM STRAP LRG (SOFTGOODS) ×2 IMPLANT
SPLINT FAST PLASTER 5X30 (CAST SUPPLIES)
SPLINT PLASTER CAST FAST 5X30 (CAST SUPPLIES) IMPLANT
SPLINT PLASTER CAST XFAST 3X15 (CAST SUPPLIES) IMPLANT
SPLINT PLASTER CAST XFAST 4X15 (CAST SUPPLIES) IMPLANT
SPLINT PLASTER XTRA FAST SET 4 (CAST SUPPLIES)
SPLINT PLASTER XTRA FASTSET 3X (CAST SUPPLIES) ×40
STOCKINETTE 4X48 STRL (DRAPES) ×3 IMPLANT
SUT ETHIBOND 3-0 V-5 (SUTURE) ×2 IMPLANT
SUT ETHILON 3 0 PS 1 (SUTURE) IMPLANT
SUT ETHILON 4 0 PS 2 18 (SUTURE) ×3 IMPLANT
SUT FIBERWIRE 2-0 18 17.9 3/8 (SUTURE) ×3
SUT MERSILENE 2.0 SH NDLE (SUTURE) IMPLANT
SUT MERSILENE 4 0 P 3 (SUTURE) IMPLANT
SUT SILK 4 0 PS 2 (SUTURE) IMPLANT
SUT STEEL 3 0 (SUTURE) IMPLANT
SUT VIC AB 0 SH 27 (SUTURE) IMPLANT
SUT VICRYL 4-0 PS2 18IN ABS (SUTURE) ×3 IMPLANT
SUTURE FIBERWR 2-0 18 17.9 3/8 (SUTURE) ×1 IMPLANT
SYR BULB 3OZ (MISCELLANEOUS) ×3 IMPLANT
SYR CONTROL 10ML LL (SYRINGE) ×2 IMPLANT
TOWEL GREEN STERILE FF (TOWEL DISPOSABLE) ×6 IMPLANT
UNDERPAD 30X36 HEAVY ABSORB (UNDERPADS AND DIAPERS) ×3 IMPLANT

## 2020-04-04 NOTE — Anesthesia Preprocedure Evaluation (Signed)
Anesthesia Evaluation  Patient identified by MRN, date of birth, ID band Patient awake    Reviewed: Allergy & Precautions, H&P , NPO status , Patient's Chart, lab work & pertinent test results  Airway Mallampati: II   Neck ROM: full    Dental   Pulmonary shortness of breath, COPD, Current Smoker and Patient abstained from smoking.,    breath sounds clear to auscultation       Cardiovascular hypertension, + Peripheral Vascular Disease   Rhythm:regular Rate:Normal     Neuro/Psych Seizures -,  PSYCHIATRIC DISORDERS Depression  Neuromuscular disease    GI/Hepatic GERD  ,  Endo/Other  diabetes, Type 2  Renal/GU Renal disease   Prostate CA    Musculoskeletal  (+) Arthritis ,   Abdominal   Peds  Hematology   Anesthesia Other Findings   Reproductive/Obstetrics                             Anesthesia Physical  Anesthesia Plan  ASA: III  Anesthesia Plan: MAC and Regional   Post-op Pain Management:    Induction: Intravenous  PONV Risk Score and Plan: 0 and Propofol infusion, Ondansetron and Treatment may vary due to age or medical condition  Airway Management Planned: Simple Face Mask  Additional Equipment:   Intra-op Plan:   Post-operative Plan:   Informed Consent: I have reviewed the patients History and Physical, chart, labs and discussed the procedure including the risks, benefits and alternatives for the proposed anesthesia with the patient or authorized representative who has indicated his/her understanding and acceptance.       Plan Discussed with: CRNA, Anesthesiologist and Surgeon  Anesthesia Plan Comments:         Anesthesia Quick Evaluation

## 2020-04-04 NOTE — Anesthesia Procedure Notes (Signed)
Anesthesia Regional Block: Supraclavicular block   Pre-Anesthetic Checklist: ,, timeout performed, Correct Patient, Correct Site, Correct Laterality, Correct Procedure, Correct Position, site marked, Risks and benefits discussed,  Surgical consent,  Pre-op evaluation,  At surgeon's request and post-op pain management  Laterality: Left  Prep: chloraprep       Needles:  Injection technique: Single-shot  Needle Type: Stimiplex     Needle Length: 9cm  Needle Gauge: 21     Additional Needles:   Procedures:,,,, ultrasound used (permanent image in chart),,,,  Narrative:  Start time: 04/04/2020 7:58 AM End time: 04/04/2020 8:03 AM Injection made incrementally with aspirations every 5 mL.  Performed by: Personally  Anesthesiologist: Lynda Rainwater, MD

## 2020-04-04 NOTE — Op Note (Signed)
I assisted Surgeon(s) and Role:    * Leanora Cover, MD - Primary    Daryll Brod, MD - Assisting on the Procedure(s): LEFT THUMB [place] on 04/04/2020.  I provided assistance on this case as follows: setup, approach, removal of the trapezium, harvesting the tendon for transfer, placement of the Microlink, transfer of the tendon, closure of the incisions and application of the dressings and splints.  Electronically signed by: Daryll Brod, MD Date: 04/04/2020 Time: 9:46 AM

## 2020-04-04 NOTE — Op Note (Addendum)
**Note Russell-Identified via Obfuscation** NAME: Russell [name] RECORD NO: [id number] DATE OF BIRTH: 05/31/1941 FACILITY: [facility] LOCATION: [facility] PHYSICIAN: Tennis Must, MD   OPERATIVE REPORT   DATE OF PROCEDURE: 04/04/20    PREOPERATIVE DIAGNOSIS:   Left thumb CMC osteoarthritis   POSTOPERATIVE DIAGNOSIS:   Left thumb CMC osteoarthritis   PROCEDURE:   1.  Left thumb trapeziectomy  2.  Left thumb suspension plasty with micro link suture   SURGEON:  Leanora Cover, M.D.   ASSISTANT: Daryll Brod, MD   ANESTHESIA:  Regional with sedation   INTRAVENOUS FLUIDS:  Per anesthesia flow sheet.   ESTIMATED BLOOD LOSS:  Minimal.   COMPLICATIONS:  None.   SPECIMENS:  none   TOURNIQUET TIME:    Total Tourniquet Time Documented: Upper Arm (Left) - 71 minutes Total: Upper Arm (Left) - 71 minutes    DISPOSITION:  Stable to PACU.   INDICATIONS: 79 year old male with left thumb CMC osteoarthritis.  He has used splints and had injections without lasting relief.  He wishes to have a left thumb trapeziectomy with suspension plasty. Risks, benefits and alternatives of surgery were discussed including the risks of blood loss, infection, damage to nerves, vessels, tendons, ligaments, bone for surgery, need for additional surgery, complications with wound healing, continued pain, stiffness.  He voiced understanding of these risks and elected to proceed.  OPERATIVE COURSE:  After being identified preoperatively by myself,  the patient and I agreed on the procedure and site of the procedure.  The surgical site was marked.  Surgical consent had been signed. He was given IV antibiotics as preoperative antibiotic prophylaxis. He was transferred to the operating room and placed on the operating table in supine position with the Left upper extremity on an arm board.  Sedation was induced by the anesthesiologist. A regional block had been performed by anesthesia in preoperative holding.   Left upper extremity was  prepped and draped in normal sterile orthopedic fashion.  A surgical pause was performed between the surgeons, anesthesia, and operating room staff and all were in agreement as to the patient, procedure, and site of procedure.  Tourniquet at the proximal aspect of the extremity was inflated to 250 mmHg after exsanguination of the arm with an Esmarch bandage.    Incision was made at the dorsum of the left thumb CMC joint.  This is carried in subcutaneous tissues by spreading technique.  Bipolar electrocautery was used to obtain hemostasis.  The [facility] joint was identified.  The capsule was incised and elevated.  The soft tissue attachments to the trapezium were freed up using a freer and Beaver blade.  The trapezium was then removed with the rongeurs.  The deep branch of the radial artery was protected throughout the case.  Once the trapezium had been removed the FCR tendon was identified.  Half of the tendon was released proximally in the wound to create a distally based tendon slip.  The micro link suture was then placed using the guidewire through the base of the metacarpal and across the index finger metacarpal.  An additional incision was made on the dorsum of the hand to allow placement of the anchor on the other side of the index finger metacarpal.  The anchors were placed in the suture tied.  This provided good suspension of the thumb metacarpal.  The FCR tendon slip was then passed through a portion of the APL tendon back onto itself.  It was sutured in place onto itself and at the  APL tendon.  The wounds were copiously irrigated with sterile saline.  C-arm was used in AP lateral oblique projections to ensure appropriate excision of the trapezium and suspension of the thumb metacarpal which was the case.  The capsule was repaired with 4-0 Vicryl suture.  The skin was closed with 4-0 nylon in a horizontal mattress fashion.  The wound was dressed with sterile Xeroform and 4 x 4's and wrapped with Kerlix bandage.   A thumb spica splint was placed and wrapped with Kerlix and Ace bandage.  The the tourniquet was deflated at 71 minutes.  Fingertips were pink with brisk capillary refill after deflation of tourniquet.  The operative  drapes were broken down.  The patient was awoken from anesthesia safely.  He was transferred back to the stretcher and taken to PACU in stable condition.  I will see him back in the office in 1 week for postoperative followup.  I will give him a prescription for Percocet 5/325 1-2 tabs PO q6 hours prn pain, dispense # 20.   Leanora Cover, MD Electronically signed, 04/04/20

## 2020-04-04 NOTE — Anesthesia Postprocedure Evaluation (Signed)
Anesthesia Post Note  Patient: Russell [name]  Procedure(s) Performed: LEFT THUMB TRAPECIECTOMY AND SUSPENSIONPLASTY (Left Hand)     Patient location during evaluation: PACU Anesthesia Type: Regional and MAC Level of consciousness: awake and alert Pain management: pain level controlled Vital Signs Assessment: post-procedure vital signs reviewed and stable Respiratory status: spontaneous breathing, nonlabored ventilation and respiratory function stable Cardiovascular status: blood pressure returned to baseline and stable Postop Assessment: no apparent nausea or vomiting Anesthetic complications: no    Last Vitals:  Vitals:   04/04/20 1030 04/04/20 1050  BP: (!) 163/82 (!) 142/71  Pulse: (!) 57 60  Resp: 15 18  Temp:  36.4 C  SpO2: 97% 96%    Last Pain:  Vitals:   04/04/20 1050  TempSrc: Oral  PainSc: 0-No pain                 Lynda Rainwater

## 2020-04-04 NOTE — Discharge Instructions (Addendum)

## 2020-04-04 NOTE — Transfer of Care (Signed)
Immediate Anesthesia Transfer of Care Note  Patient: Russell [name]  Procedure(s) Performed: LEFT THUMB TRAPECIECTOMY AND SUSPENSIONPLASTY (Left Hand)  Patient Location: PACU  Anesthesia Type:MAC and Regional  Level of Consciousness: awake and alert   Airway & Oxygen Therapy: Patient Spontanous Breathing and Patient connected to face mask oxygen  Post-op Assessment: Report given to RN and Post -op Vital signs reviewed and stable  Post vital signs: Reviewed and stable  Last Vitals:  Vitals Value Taken Time  BP 133/78 04/04/20 0952  Temp    Pulse 59 04/04/20 0954  Resp 17 04/04/20 0954  SpO2 99 % 04/04/20 0954  Vitals shown include unvalidated device data.  Last Pain:  Vitals:   04/04/20 0715  TempSrc: Temporal         Complications: No apparent anesthesia complications

## 2020-04-04 NOTE — Progress Notes (Signed)
Assisted Dr. Miller with left, ultrasound guided, supraclavicular block. Side rails up, monitors on throughout procedure. See vital signs in flow sheet. Tolerated Procedure well. 

## 2020-04-04 NOTE — H&P (Signed)
Russell [name] is an 79 y.o. male.   Chief Complaint: left thumb cmc oa HPI: 79 yo male with left thumb cmc pain.  This has been injected several times without lasting resolution.  He wishes to have left thumb trapeziectomy and suspensionplasty.  Allergies: No Known Allergies  Past Medical History:  Diagnosis Date  . AKI (acute kidney injury) ([place]) 06/23/2017  . Arthritis    SHOUDLERS, LUMBAR  . BPH with obstruction/lower urinary tract symptoms   . BPH with urinary obstruction 01/15/2016  . COPD (chronic obstructive pulmonary disease) ([place])   . Coronary atherosclerosis    denied knowledge of this 10/27/16  . Depression   . Diverticulosis of colon   . Dyspnea   . ED (erectile dysfunction)   . Full dentures   . GERD (gastroesophageal reflux disease)    not current  . Glaucoma   . History of gastritis   . History of scabies    2008  . 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 History of syncope    03-22-2014  DX  VAGAL RESPONSE  . Hyperlipidemia, mixed   . Hypertension   . Mitral regurgitation   . Peripheral arterial disease ([place])    one vessel runoff bilaterally via peroneal arteries  . Prostate cancer ([place])   . Seizures ([place]) 03/2014   ? or syncope- patient refused to be admitted for further studies- "felt better"  . Type 2 diabetes, diet controlled ([place])    patient and his wife said no- have not been told 01/21/17  . Wears glasses     Past Surgical History:  Procedure Laterality Date  . ANTERIOR CERVICAL DECOMP/DISCECTOMY FUSION  10-16-2009   C4 -- C6  . ANTERIOR CERVICAL DECOMP/DISCECTOMY FUSION N/A 01/27/2017   Procedure: ANTERIOR CERVICAL DECOMPRESSION FUSION CERVICAL 3-4 WITH INSTRUMENTATION AND ALLOGRAFT;  Surgeon: Phylliss Bob, MD;  Location: [facility];  Service: Orthopedics;  Laterality: N/A;  ANTERIOR CERVICAL DECOMPRESSION FUSION CERVICAL 3-4 WITH INSTRUMENTATION AND ALLOGRAFT  . CAPSULOTOMY Right 01/26/2013   Procedure: MINOR CAPSULOTOMY;   Surgeon: Myrtha Mantis., MD;  Location: [place];  Service: Ophthalmology;  Laterality: Right;  . CARPAL TUNNEL RELEASE Left 01/02/2019   Procedure: LEFT CARPAL TUNNEL RELEASE;  Surgeon: Leanora Cover, MD;  Location: [facility];  Service: Orthopedics;  Laterality: Left;  . CARPECTOMY Right 03/13/2014   Procedure: RIGHT  PROXIMAL ROW CARPECTOMY;  Surgeon: Tennis Must, MD;  Location: [facility];  Service: Orthopedics;  Laterality: Right;  . CARPECTOMY Left 10/22/2015   Procedure: LEFT WRIST PROXIMAL ROW CARPECTOMY ;  Surgeon: Leanora Cover, MD;  Location: [facility];  Service: Orthopedics;  Laterality: Left;  . CARPOMETACARPAL ([place]) FUSION OF THUMB Right 03/13/2014   Procedure: RIGHT FUSION OF THUMB CARPOMETACARPAL [facility]) JOINT;  Surgeon: Tennis Must, MD;  Location: [place];  Service: Orthopedics;  Laterality: Right;  . CATARACT EXTRACTION W/ INTRAOCULAR LENS  IMPLANT, BILATERAL  2009  . COLONOSCOPY  08-31-2007  . COLONOSCOPY    . CRYOABLATION N/A 01/14/2015   Procedure: CRYO ABLATION PROSTATE;  Surgeon: Ailene Rud, MD;  Location: [facility];  Service: Urology;  Laterality: N/A;  . ESOPHAGOGASTRODUODENOSCOPY  last one 09-11-2008  . EXCISION MASS LEFT FACE , SUBORBITAL AREA, PLASTER RECONSTRUCTION  02-09-2011  . EXCISIONAL DEBRIDEMENT AND REPAIR RIGHT QUADRICEP TENDON  07-05-2000  . FOOT SURGERY Left   . I & D  EXTREMITY Right 05/24/2013   Procedure: RIGHT INDEX WOUND DEBRIDEMENT AND CLOSURE;  Surgeon: Jolyn Nap, MD;  Location: [facility];  Service: Orthopedics;  Laterality: Right;  . INSERTION OF SUPRAPUBIC CATHETER N/A 01/14/2015   Procedure: SUPRAPUBIC TUBE PLACEMENT;  Surgeon: Ailene Rud, MD;  Location: [facility];  Service: Urology;  Laterality: N/A;  . KNEE ARTHROSCOPY Right 2008  . LARYNGOSCOPY AND ESOPHAGOSCOPY  06-13-2010   MARSUPIALIZATION LEFT LARGE  VALLECULAR CYST  . MASS EXCISION Left 10/22/2015   Procedure: EXCISION MASS OF LEFT INDEX FINGER;  Surgeon: Leanora Cover, MD;  Location: [place];  Service: Orthopedics;  Laterality: Left;  . ORCHIECTOMY Left 02/24/2015   Procedure: SCROTAL EXPLORATION WITH LEFT ORCHIECTOMY;  Surgeon: Kathie Rhodes, MD;  Location: [place];  Service: Urology;  Laterality: Left;  . POSTERIOR CERVICAL FUSION/FORAMINOTOMY N/A 01/28/2017   Procedure: POSTERIOR SPINAL FUSION CERVICAL 3-4, CERVICAL 4-5, CERVICAL 5-6, CERVICAL 6-7 WITH INSTRUMENATION AND ALLOGRAFT;  Surgeon: Phylliss Bob, MD;  Location: [place];  Service: Orthopedics;  Laterality: N/A;  POSTERIOR SPINAL FUSION CERVICAL 3-4, CERVICAL 4-5, CERVICAL 5-6, CERVICAL 6-7 WITH INSTRUMENATION AND ALLOGRAFT  . SHOULDER ARTHROSCOPY/ DEBRIDEMENT LABRAL TEAR/  BICEPS TENOTOMY Left 09-24-2011  . TONSILLECTOMY  as child  . TRANSTHORACIC ECHOCARDIOGRAM  02-21-2010   normal LVF/  ef 55-60%/ mild to moderate MR/  moderate LAE/  mild TR  . YAG LASER APPLICATION Right [id number]   Procedure: YAG LASER APPLICATION;  Surgeon: Myrtha Mantis., MD;  Location: [place];  Service: Ophthalmology;  Laterality: Right;  . YAG LASER CAPSULOTOMY, LEFT EYE  01-08-2011    Family History: Family History  Problem Relation Age of Onset  . Unexplained death Mother        died at a young age, no known cause  . Heart attack Father        age unknown  . Diabetes Other   . Cancer Other     Social History:   reports that he has been smoking cigarettes. He has a 59.00 pack-year smoking history. He has never used smokeless tobacco. He reports that he does not drink alcohol or use drugs.  Medications: Medications Prior to Admission  Medication Sig Dispense Refill  . AZOPT 1 % ophthalmic suspension Place 1 drop into the left eye 3 (three) times daily.     . bimatoprost (LUMIGAN) 0.01 % SOLN Place 1 drop into both eyes at bedtime. 30 mL 3  . brimonidine (ALPHAGAN) 0.2 %  ophthalmic solution INT 1 GTT IN OU BID    . brimonidine-timolol (COMBIGAN) 0.2-0.5 % ophthalmic solution Place 1 drop into both eyes 2 (two) times daily.     Marland Kitchen esomeprazole (NEXIUM) 40 MG capsule Take 1 capsule (40 mg total) by mouth daily. (Patient taking differently: Take 40 mg by mouth as needed. ) 30 capsule 2  . ferrous sulfate 325 (65 FE) MG tablet Take 325 mg by mouth daily.     Marland Kitchen losartan (COZAAR) 100 MG tablet Take 100 mg by mouth daily.  4  . Multiple Vitamins-Minerals (CENTRUM SILVER ADULT 50+ PO) Take 1 tablet by mouth daily.    . Omega-3 Fatty Acids (FISH OIL) 1000 MG CPDR Take 1,000 mg by mouth daily.    Marland Kitchen oxybutynin (DITROPAN) 5 MG tablet Take 5 mg by mouth every morning.    . pravastatin (PRAVACHOL) 80 MG tablet Take 80 mg by mouth daily.   2  . tamsulosin (FLOMAX) 0.4 MG CAPS capsule  Take 0.4 mg by mouth at bedtime.     . hydrochlorothiazide (HYDRODIURIL) 12.5 MG tablet Take 12.5 mg by mouth daily.   3  . meclizine (ANTIVERT) 25 MG tablet Take 25 mg by mouth daily.    Marland Kitchen oxyCODONE-acetaminophen (PERCOCET) 10-325 MG tablet Take 1 tablet by mouth every 4 (four) hours as needed for pain. 21 tablet 0    No results found for this or any previous visit (from the past 48 hour(s)).  No results found.   A comprehensive review of systems was negative.  Blood pressure (!) 142/74, pulse 61, temperature (!) 96.8 F (36 C), temperature source Temporal, resp. rate 10, height 6\' 2"  (1.88 m), weight 77.3 kg, SpO2 100 %.  General appearance: alert, cooperative and appears stated age Head: Normocephalic, without obvious abnormality, atraumatic Neck: supple, symmetrical, trachea midline Cardio: regular rate and rhythm Resp: clear to auscultation bilaterally Extremities: Intact sensation and capillary refill all digits.  +epl/fpl/io.  No wounds.  Pulses: 2+ and symmetric Skin: Skin color, texture, turgor normal. No rashes or lesions Neurologic: Grossly normal Incision/Wound:  none  Assessment/Plan Left thumb cmc oa.  Plan trapeziectomy and suspensionplasty.  Non operative and operative treatment options have been discussed with the patient and patient wishes to proceed with operative treatment. Risks, benefits, and alternatives of surgery have been discussed and the patient agrees with the plan of care.   Leanora Cover 04/04/2020, 8:11 AM

## 2020-04-04 NOTE — Addendum Note (Signed)
Addendum  created 04/04/20 1149 by Lieutenant Diego, CRNA   Charge Capture section accepted

## 2020-04-09 ENCOUNTER — Encounter: Payer: Self-pay | Admitting: *Deleted

## 2020-04-16 ENCOUNTER — Other Ambulatory Visit: Payer: Self-pay

## 2020-04-16 ENCOUNTER — Encounter: Payer: Self-pay | Admitting: Podiatry

## 2020-04-16 ENCOUNTER — Ambulatory Visit (INDEPENDENT_AMBULATORY_CARE_PROVIDER_SITE_OTHER): Payer: Medicare Other | Admitting: Podiatry

## 2020-04-16 ENCOUNTER — Other Ambulatory Visit: Payer: Self-pay | Admitting: Podiatry

## 2020-04-16 DIAGNOSIS — G5762 Lesion of plantar nerve, left lower limb: Secondary | ICD-10-CM

## 2020-04-16 DIAGNOSIS — G5782 Other specified mononeuropathies of left lower limb: Secondary | ICD-10-CM

## 2020-04-16 MED ORDER — OXYCODONE-ACETAMINOPHEN 10-325 MG PO TABS: 1.0000 | ORAL_TABLET | Freq: Three times a day (TID) | ORAL | 0 refills | Status: AC | PRN

## 2020-04-16 NOTE — Progress Notes (Signed)
He presents today with arm in a sling saying that he just had surgery on his wrist and hand but is really not helping his arm very much at all. He states that the injections helped for 2 to 3 weeks and then his toe started to hurt so bad he can hardly stand it. The pain medicine just barely covers the pain. He like to have the toe removed.  Objective: Vital signs are stable he is alert and oriented x3. There is no erythema edema cellulitis drainage or odor pulses are nonpalpable. No open lesions or wounds. He does have severe pain on palpation of the third and fourth toes of the left foot.  Assessment: Neuroma third and fourth toes left foot.  Plan: I injected these areas today of dehydrated alcohol follow-up with him in a couple of the 3 weeks and also wrote a prescription for his Vicodin or Percocet.

## 2020-05-09 ENCOUNTER — Ambulatory Visit (INDEPENDENT_AMBULATORY_CARE_PROVIDER_SITE_OTHER): Payer: Medicare Other | Admitting: Podiatry

## 2020-05-09 ENCOUNTER — Encounter: Payer: Self-pay | Admitting: Podiatry

## 2020-05-09 ENCOUNTER — Other Ambulatory Visit: Payer: Self-pay

## 2020-05-09 DIAGNOSIS — G5792 Unspecified mononeuropathy of left lower limb: Secondary | ICD-10-CM

## 2020-05-09 DIAGNOSIS — G5782 Other specified mononeuropathies of left lower limb: Secondary | ICD-10-CM

## 2020-05-09 DIAGNOSIS — G5762 Lesion of plantar nerve, left lower limb: Secondary | ICD-10-CM | POA: Diagnosis not present

## 2020-05-09 MED ORDER — OXYCODONE-ACETAMINOPHEN 10-325 MG PO TABS: 1.0000 | ORAL_TABLET | ORAL | 0 refills | Status: DC | PRN

## 2020-05-09 NOTE — Progress Notes (Signed)
He presents today for follow-up of neuroma third interspace of the left foot states that is really been acting up a lot lately.  He states that his hand is starting to do a little bit better left.  Objective: Vital signs stable he is alert and oriented x3.  So severe pain on palpation third interdigital space left foot.  Assessment: Stump neuroma third interspace.  And peripheral vascular disease.  Plan: I injected 10 mg of Kenalog third interdigital space left foot.  Refilled his Percocet.  Follow-up with him in 3 weeks.

## 2020-06-04 ENCOUNTER — Other Ambulatory Visit: Payer: Self-pay

## 2020-06-04 ENCOUNTER — Ambulatory Visit: Payer: Medicare Other | Admitting: Podiatry

## 2020-06-04 ENCOUNTER — Telehealth: Payer: Self-pay | Admitting: Podiatry

## 2020-06-04 MED ORDER — OXYCODONE-ACETAMINOPHEN 10-325 MG PO TABS: 1.0000 | ORAL_TABLET | Freq: Three times a day (TID) | ORAL | 0 refills | Status: AC | PRN

## 2020-06-04 NOTE — Telephone Encounter (Signed)
Left message informing pt the medication had been called to the pharmacy.

## 2020-06-04 NOTE — Telephone Encounter (Signed)
Pt called and would like a refill on pain medication

## 2020-06-04 NOTE — Telephone Encounter (Signed)
Pt called requesting a call back from the nurse. He is wanting a refill on his pain medication. Pts appt for today was cancelled due to Dr Milinda Pointer being out of office for emergency.

## 2020-06-04 NOTE — Telephone Encounter (Signed)
Left message for pt in previous telephone message.

## 2020-06-04 NOTE — Addendum Note (Signed)
Addended by: Boneta Lucks on: 06/04/2020 12:05 PM   Modules accepted: Orders

## 2020-06-04 NOTE — Telephone Encounter (Signed)
Done

## 2020-06-11 ENCOUNTER — Other Ambulatory Visit: Payer: Self-pay | Admitting: Neurology

## 2020-06-11 ENCOUNTER — Telehealth: Payer: Self-pay | Admitting: Neurology

## 2020-06-11 NOTE — Telephone Encounter (Signed)
Spoke with pt and inst him that because he hasn't been seen since March 2020 the gabapentin prescription cannot be refilled, he needs to be seen by Dr Posey Pronto first, he verbalized understanding. Will request an appt be made for him.

## 2020-06-11 NOTE — Telephone Encounter (Signed)
Patient called in wanting a refill of his Gabapentin sent in to [facility] on [address] in [place]

## 2020-06-11 NOTE — Telephone Encounter (Signed)
Phone number listed not in service. Wife's phone on file does not have voicemail set up. Will try again later.

## 2020-06-11 NOTE — Telephone Encounter (Signed)
Please schedule an appt for pt with Dr Posey Pronto. Thanks

## 2020-06-11 NOTE — Telephone Encounter (Signed)
Should this pt be seen or can he have refill w/o visit?

## 2020-06-20 ENCOUNTER — Other Ambulatory Visit: Payer: Self-pay

## 2020-06-20 ENCOUNTER — Ambulatory Visit (INDEPENDENT_AMBULATORY_CARE_PROVIDER_SITE_OTHER): Payer: Medicare Other | Admitting: Podiatry

## 2020-06-20 ENCOUNTER — Encounter: Payer: Self-pay | Admitting: Podiatry

## 2020-06-20 DIAGNOSIS — G5782 Other specified mononeuropathies of left lower limb: Secondary | ICD-10-CM

## 2020-06-20 DIAGNOSIS — G5762 Lesion of plantar nerve, left lower limb: Secondary | ICD-10-CM | POA: Diagnosis not present

## 2020-06-20 MED ORDER — OXYCODONE-ACETAMINOPHEN 10-325 MG PO TABS: 1.0000 | ORAL_TABLET | ORAL | 0 refills | Status: DC | PRN

## 2020-06-20 NOTE — Progress Notes (Signed)
He presents today for follow-up of neuroma third interdigital space of the left foot.  He states that when you put it in the toes seem to be doing better as he refers to the medial aspect of the fourth toe and the lateral aspect of the third toe.  He states that that there is a numb area that goes all the way up to my leg.  Objective: Vital signs stable he is alert oriented x3 on palpation there does not appear to be any numbness he does not have a loss of sensation pulses are really nonpalpable we know that he has peripheral vascular disease.  Stump neuroma third interdigital space of the left foot is still prominent.  Assessment: Neuritis neuroma third interspace left.  Plan: At this point divided up the injections using a 30-gauge needle into 3 injections medial aspect of the fourth toe lateral aspect of the third toe and then in the interspace.  He states that while I feel like we got it and he states that I am sure that that will make it feel better.  We went ahead and refilled his Percocet as well.

## 2020-07-16 ENCOUNTER — Telehealth: Payer: Self-pay | Admitting: Podiatry

## 2020-07-16 ENCOUNTER — Other Ambulatory Visit: Payer: Self-pay | Admitting: Podiatry

## 2020-07-16 MED ORDER — OXYCODONE-ACETAMINOPHEN 10-325 MG PO TABS: 1.0000 | ORAL_TABLET | Freq: Three times a day (TID) | ORAL | 0 refills | Status: AC | PRN

## 2020-07-16 NOTE — Telephone Encounter (Signed)
Pt called requesting pain medication to hold him over until his follow up appt on 07/25/20

## 2020-07-16 NOTE — Telephone Encounter (Signed)
done

## 2020-07-25 ENCOUNTER — Other Ambulatory Visit: Payer: Self-pay | Admitting: Podiatry

## 2020-07-25 ENCOUNTER — Other Ambulatory Visit: Payer: Self-pay

## 2020-07-25 ENCOUNTER — Ambulatory Visit (INDEPENDENT_AMBULATORY_CARE_PROVIDER_SITE_OTHER): Payer: Medicare Other | Admitting: Podiatry

## 2020-07-25 DIAGNOSIS — G5782 Other specified mononeuropathies of left lower limb: Secondary | ICD-10-CM

## 2020-07-25 DIAGNOSIS — G5762 Lesion of plantar nerve, left lower limb: Secondary | ICD-10-CM

## 2020-07-25 MED ORDER — OXYCODONE-ACETAMINOPHEN 10-325 MG PO TABS: 1.0000 | ORAL_TABLET | Freq: Three times a day (TID) | ORAL | 0 refills | Status: AC | PRN

## 2020-07-25 NOTE — Progress Notes (Unsigned)
per

## 2020-07-26 NOTE — Progress Notes (Signed)
He presents today for follow-up of his painful neuroma and peripheral vascular disease of his left foot.  He states that the last injection seemed to help a little bit better as we had injected between the third and fourth toes and across the top.  Objective: Vital signs are stable he is alert oriented x3 pulses are nonpalpable capillary fill time is sluggish he has pain on palpation to the proximal third interdigital space where a stump neuroma is present.  He also has pain on the medial aspect of the third and fourth toes.  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  Peripheral vascular disease and neuropathy.  Plan: Injected the area today with 2 cc 4% dehydrated alcohol and local anesthetic.  Tolerated procedure well without complications and I will follow-up with him in 6 weeks or so I did write him another prescription for Percocet No. 21 1 every 8 hours as needed for pain

## 2020-08-20 ENCOUNTER — Ambulatory Visit (INDEPENDENT_AMBULATORY_CARE_PROVIDER_SITE_OTHER): Payer: Medicare Other | Admitting: Podiatry

## 2020-08-20 ENCOUNTER — Other Ambulatory Visit: Payer: Self-pay | Admitting: Podiatry

## 2020-08-20 ENCOUNTER — Other Ambulatory Visit: Payer: Self-pay

## 2020-08-20 VITALS — Temp 97.6°F

## 2020-08-20 DIAGNOSIS — G5762 Lesion of plantar nerve, left lower limb: Secondary | ICD-10-CM | POA: Diagnosis not present

## 2020-08-20 DIAGNOSIS — B351 Tinea unguium: Secondary | ICD-10-CM

## 2020-08-20 DIAGNOSIS — M79676 Pain in unspecified toe(s): Secondary | ICD-10-CM | POA: Diagnosis not present

## 2020-08-20 DIAGNOSIS — G5782 Other specified mononeuropathies of left lower limb: Secondary | ICD-10-CM

## 2020-08-20 MED ORDER — OXYCODONE-ACETAMINOPHEN 10-325 MG PO TABS: 1.0000 | ORAL_TABLET | ORAL | 0 refills | Status: DC | PRN

## 2020-08-20 NOTE — Progress Notes (Signed)
He presents today for follow-up of his left foot states that the last shot really did not help much at all and he has run out of his pain medication.  Objective: Vital signs are stable pulses are nonpalpable epilator fill time is sluggish his feet are warm to the touch and he still has tenderness on palpation to the third interdigital space of the left foot.  Assessment: Neuroma or stump neuroma third interspace left.  Plan: Today I injected 10 mg of Kenalog to the point of maximal tenderness.  And refilled his Percocet 21 tablets.  Follow-up with him in 3 to 4 weeks.

## 2020-09-03 ENCOUNTER — Telehealth: Payer: Self-pay | Admitting: Podiatry

## 2020-09-03 NOTE — Telephone Encounter (Signed)
Please advise. Appointment is on 9/30

## 2020-09-03 NOTE — Telephone Encounter (Signed)
Patient is calling about a refill on pain meds. Please call patient

## 2020-09-04 ENCOUNTER — Other Ambulatory Visit: Payer: Self-pay | Admitting: Podiatry

## 2020-09-04 MED ORDER — OXYCODONE-ACETAMINOPHEN 10-325 MG PO TABS: 1.0000 | ORAL_TABLET | Freq: Three times a day (TID) | ORAL | 0 refills | Status: AC | PRN

## 2020-09-04 NOTE — Telephone Encounter (Signed)
sent 

## 2020-09-16 ENCOUNTER — Other Ambulatory Visit: Payer: Self-pay

## 2020-09-16 ENCOUNTER — Ambulatory Visit (INDEPENDENT_AMBULATORY_CARE_PROVIDER_SITE_OTHER): Payer: Medicare Other | Admitting: Neurology

## 2020-09-16 ENCOUNTER — Encounter: Payer: Self-pay | Admitting: Neurology

## 2020-09-16 VITALS — BP 130/77 | HR 84 | Ht 74.0 in | Wt 160.0 lb

## 2020-09-16 DIAGNOSIS — G629 Polyneuropathy, unspecified: Secondary | ICD-10-CM | POA: Diagnosis not present

## 2020-09-16 DIAGNOSIS — G5732 Lesion of lateral popliteal nerve, left lower limb: Secondary | ICD-10-CM

## 2020-09-16 MED ORDER — GABAPENTIN 300 MG PO CAPS: 300.0000 mg | ORAL_CAPSULE | Freq: Every day | ORAL | 3 refills | Status: DC

## 2020-09-16 NOTE — Patient Instructions (Signed)
Take gabapentin 300mg  at bedtime  Avoid crossing your legs  Return to clinic in 1 year

## 2020-09-16 NOTE — Progress Notes (Signed)
Follow-up Visit   Date: 09/16/20   Russell [name] MRN: [id number] DOB: 1941/06/19   Interim History: Russell [name] is a 79 y.o. right-handed African American male with COPD, CAD, BPH, hyperlipidemia, GERD, diabetes mellitus, history of seizure vs syncope, s/p cervical surgery, tobacco abuse, and history of alcohol abuse (sober 2003), left peroneal mononeuropathy returning to the clinic for neuropathy and left foot drop.  The patient was accompanied to the clinic by self.  History of present illness: Starting around 2018, he began having numbness and tingling of the left anterior leg and over the top of the foot.  He also has some numbness and tingling of both feet, over the toes, but he does not have numbness extending into the right lower leg.  Symptoms are constant.  There are no exacerbating or alleviating factors.  He denies low back pain or radicular pain from his back.  He has noticed mild radiating pain down his left lateral leg into the foot.  Last year, he was having falls because of left leg weakness, stating that it would give out.  He admits to crossing his legs frequently and started to avoid doing this on with the left leg, because it would cause increased pain in the lower leg.   He has been sober since 2003.  He was previously heavily and daily for 25-30 years.  UPDATE 09/16/2020:  He is here for follow-up visit because of refills needed on gabapentin.  He has been out of gabapentin for several month and feels that it reduced the intensity of tingling over the left leg.  He continues to have some weakness and dragging of the left leg.  No falls and walks unassisted. He admits to crossing his legs frequently, despite my recommendations to avoid doing this.     Medications:  Current Outpatient Medications on File Prior to Visit  Medication Sig Dispense Refill  . amitriptyline (ELAVIL) 25 MG tablet Take 25 mg by mouth at bedtime.    . AZOPT 1 % ophthalmic suspension  Place 1 drop into the left eye 3 (three) times daily.     . bimatoprost (LUMIGAN) 0.01 % SOLN Place 1 drop into both eyes at bedtime. 30 mL 3  . brimonidine (ALPHAGAN) 0.2 % ophthalmic solution INT 1 GTT IN OU BID    . brimonidine-timolol (COMBIGAN) 0.2-0.5 % ophthalmic solution Place 1 drop into both eyes 2 (two) times daily.     Marland Kitchen esomeprazole (NEXIUM) 40 MG capsule Take 1 capsule (40 mg total) by mouth daily. (Patient taking differently: Take 40 mg by mouth as needed. ) 30 capsule 2  . ferrous sulfate 325 (65 FE) MG tablet Take 325 mg by mouth daily.     . hydrochlorothiazide (HYDRODIURIL) 12.5 MG tablet Take 12.5 mg by mouth daily.   3  . losartan (COZAAR) 100 MG tablet Take 100 mg by mouth daily.  4  . meclizine (ANTIVERT) 25 MG tablet Take 25 mg by mouth daily.    . Multiple Vitamins-Minerals (CENTRUM SILVER ADULT 50+ PO) Take 1 tablet by mouth daily.    Marland Kitchen MYRBETRIQ 25 MG TB24 tablet Take 25 mg by mouth daily.    . Omega-3 Fatty Acids (FISH OIL) 1000 MG CPDR Take 1,000 mg by mouth daily.    Marland Kitchen oxybutynin (DITROPAN) 5 MG tablet Take 5 mg by mouth every morning.    Marland Kitchen oxyCODONE-acetaminophen (PERCOCET) 10-325 MG tablet Take 1 tablet by mouth every 4 (four) hours as needed for pain.  21 tablet 0  . pravastatin (PRAVACHOL) 80 MG tablet Take 80 mg by mouth daily.   2  . tamsulosin (FLOMAX) 0.4 MG CAPS capsule Take 0.4 mg by mouth at bedtime.      No current facility-administered medications on file prior to visit.    Allergies:  Allergies  Allergen Reactions  . Tramadol     Other reaction(s): Confusion (intolerance)     Vital Signs:  BP 130/77   Pulse 84   Ht 6\' 2"  (1.88 m)   Wt 160 lb (72.6 kg)   SpO2 99%   BMI 20.54 kg/m   Neurological Exam: MENTAL STATUS including orientation to time, place, person, recent and remote memory, attention span and concentration, language, and fund of knowledge is normal.  Speech is not dysarthric.  CRANIAL NERVES:    Normal conjugate,  extra-ocular eye movements in all directions of gaze.  Mild left ptosis (old).  Face is symmetric  MOTOR:  Generalized loss of muscle bulk throughout.  No fasciculations or abnormal movements.  No pronator drift.   Upper Extremity:  Right  Left  Deltoid  5/5   5/5   Biceps  5/5   5/5   Triceps  5/5   5/5   Infraspinatus 5/5  5/5  Medial pectoralis 5/5  5/5  Wrist extensors  5/5   5/5   Wrist flexors  5/5   5/5   Finger extensors  5/5   5/5   Finger flexors  5/5   5/5   Dorsal interossei  5/5   5/5   Abductor pollicis  5/5   5/5   Tone (Ashworth scale)  0  0   Lower Extremity:  Right  Left  Hip flexors  5/5   5/5   Hip extensors  5/5   5/5   Adductor 5/5  5/5  Abductor 5/5  5/5  Knee flexors  5/5   5/5   Knee extensors  5/5   5/5   Dorsiflexors  5/5   4/5   Plantarflexors  5/5   5/5   Toe extensors  5/5   4/5   Toe flexors  5/5   5/5   Tone (Ashworth scale)  0  0   MSRs:  Reflexes are 2+/4 throughout except absent ankle jersk.  SENSORY:  Vibration absent distal to ankles bilaterally. Reduced temperature over the left lower lateral leg.   COORDINATION/GAIT:  Normal finger-to- nose-finger.  Gait appears stable, unassisted.  Very mild dragging of the left foot.  Data: MRI lumbar spine 06/15/2014: 1. 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 2. Chronic severe left lateral recess stenosis at L5-S1 persists despite some regression of the associated chronic disc herniation here. Chronic facet and ligament flavum hypertrophy is severe and progressed at this level. Mild spinal stenosis. Stable severe left and moderate right L5 foraminal stenosis. 3. Diverticulosis of the distal colon.  NCS/EMG of the left leg 01/17/2019: 1. 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 2. There is evidence of a superimposed left common peroneal mononeuropathy at or  above the take off to the tibialis anterior muscle, which is mild-moderate in degree electrically.   IMPRESSION/PLAN: 1. Left peroneal mononeuropathy, mild.  - Educated on the importance of avoiding less crossing  - Start gabapentin 300mg  at bedtime.  Cautious with titration due to CKD  2. Peripheral neuropathy, alcohol-induced.  Prior history of alcohol abuse, sober since 2003.  -  Hopefully gabapentin will help with paresthesias  - Patient educated on daily foot inspection, fall prevention, and safety precautions around the home.  Return to clinic in 1 year  Thank you for allowing me to participate in patient's care.  If I can answer any additional questions, I would be pleased to do so.    Sincerely,    Adron Geisel K. Posey Pronto, DO

## 2020-09-19 ENCOUNTER — Telehealth: Payer: Self-pay | Admitting: Podiatry

## 2020-09-19 ENCOUNTER — Ambulatory Visit: Payer: Medicare Other | Admitting: Podiatry

## 2020-09-19 NOTE — Telephone Encounter (Signed)
We sent some in last week.

## 2020-09-19 NOTE — Telephone Encounter (Signed)
Patient would like pain medication. Pharmacy is [facility] [address] He would like a call when it is sent in.

## 2020-10-03 ENCOUNTER — Other Ambulatory Visit: Payer: Self-pay

## 2020-10-03 ENCOUNTER — Ambulatory Visit (INDEPENDENT_AMBULATORY_CARE_PROVIDER_SITE_OTHER): Payer: Medicare Other | Admitting: Podiatry

## 2020-10-03 ENCOUNTER — Encounter: Payer: Self-pay | Admitting: Podiatry

## 2020-10-03 DIAGNOSIS — Z789 Other specified health status: Secondary | ICD-10-CM | POA: Diagnosis not present

## 2020-10-03 DIAGNOSIS — G5782 Other specified mononeuropathies of left lower limb: Secondary | ICD-10-CM

## 2020-10-03 DIAGNOSIS — G5762 Lesion of plantar nerve, left lower limb: Secondary | ICD-10-CM

## 2020-10-03 DIAGNOSIS — I739 Peripheral vascular disease, unspecified: Secondary | ICD-10-CM

## 2020-10-03 MED ORDER — OXYCODONE-ACETAMINOPHEN 10-325 MG PO TABS: 1.0000 | ORAL_TABLET | ORAL | 0 refills | Status: DC | PRN

## 2020-10-03 NOTE — Progress Notes (Signed)
He presents today for follow-up of his pain in the neuroma in his foot and leg.  States that is been hurting.  States that he went to a nerve conduction test once again and still feels the test was inconclusive or did not demonstrate any type of nerve problem in his leg.  He was prescribed gabapentin which she states did not work was told to follow-up with him in 1 year.  Objective: Vital signs are stable pulses are nonpalpable left lower extremity foot is cool to the touch which cooler than normal we note that he has known peripheral arterial disease but it seems to be possibly worsening as his skin is even tender to the touch.  Assessment peripheral arterial disease neuroma stump neuroma third interdigital space area left foot.  Plan: Injected with 1/2cc 4% dehydrated alcohol to the third interdigital space.  We recommended ABIs to be performed and we will schedule that for him I will follow-up with him in about 3 weeks or so.  I also refilled his pain medication.

## 2020-10-23 ENCOUNTER — Other Ambulatory Visit: Payer: Self-pay | Admitting: Podiatry

## 2020-10-23 MED ORDER — OXYCODONE-ACETAMINOPHEN 10-325 MG PO TABS: 1.0000 | ORAL_TABLET | Freq: Three times a day (TID) | ORAL | 0 refills | Status: AC | PRN

## 2020-10-24 ENCOUNTER — Ambulatory Visit (INDEPENDENT_AMBULATORY_CARE_PROVIDER_SITE_OTHER): Payer: Medicare Other | Admitting: Podiatry

## 2020-10-24 ENCOUNTER — Other Ambulatory Visit: Payer: Self-pay

## 2020-10-24 ENCOUNTER — Encounter: Payer: Self-pay | Admitting: Podiatry

## 2020-10-24 DIAGNOSIS — G5762 Lesion of plantar nerve, left lower limb: Secondary | ICD-10-CM

## 2020-10-24 DIAGNOSIS — I739 Peripheral vascular disease, unspecified: Secondary | ICD-10-CM

## 2020-10-24 DIAGNOSIS — G5782 Other specified mononeuropathies of left lower limb: Secondary | ICD-10-CM

## 2020-10-24 NOTE — Progress Notes (Signed)
He presents today complaining of pain now up into his lateral aspect of his leg from his foot.  States that seems to be getting worse.  Only thing makes it feel better he says is the narcotic.  Objective: Vital signs are stable he is alert and oriented x3.  Pulses are non-palpable and capillary fill time is sluggish.  He has pain with palpation to the third interdigital space with pain on palpation of this area he has radiating pain to the head of the fibula.  He also has tenderness in the calf and on active range of motion.  Assessment: Stump neuroma third interspace left foot.  Neuritis peroneal nerve.  Peripheral vascular disease left lower extremity.  Plan: Discussed etiology pathology conservative surgical therapies this point time went ahead and injected him with 10 mg of Kenalog to the point of maximal tenderness left.  And will have him scheduled for another set of ABIs.

## 2020-11-19 ENCOUNTER — Other Ambulatory Visit: Payer: Self-pay

## 2020-11-19 ENCOUNTER — Encounter: Payer: Self-pay | Admitting: Podiatry

## 2020-11-19 ENCOUNTER — Ambulatory Visit (INDEPENDENT_AMBULATORY_CARE_PROVIDER_SITE_OTHER): Payer: Medicare Other | Admitting: Podiatry

## 2020-11-19 DIAGNOSIS — Z789 Other specified health status: Secondary | ICD-10-CM

## 2020-11-19 DIAGNOSIS — B351 Tinea unguium: Secondary | ICD-10-CM

## 2020-11-19 DIAGNOSIS — M79676 Pain in unspecified toe(s): Secondary | ICD-10-CM | POA: Diagnosis not present

## 2020-11-19 DIAGNOSIS — G5782 Other specified mononeuropathies of left lower limb: Secondary | ICD-10-CM

## 2020-11-19 DIAGNOSIS — G5762 Lesion of plantar nerve, left lower limb: Secondary | ICD-10-CM | POA: Diagnosis not present

## 2020-11-19 DIAGNOSIS — I739 Peripheral vascular disease, unspecified: Secondary | ICD-10-CM

## 2020-11-19 MED ORDER — OXYCODONE-ACETAMINOPHEN 10-325 MG PO TABS: 1.0000 | ORAL_TABLET | Freq: Three times a day (TID) | ORAL | 0 refills | Status: AC | PRN

## 2020-11-19 NOTE — Progress Notes (Signed)
He presents today for follow-up of his neuroma third interspace of his left foot and is complaining of painful elongated toenails.  He has not had his ABIs done yet.  Objective: Vital signs are stable alert oriented x3 severe pain on palpation to the distal aspect of the third interdigital space.  Pulses are nonpalpable.  No open lesions or wounds.  His toenails are long thick yellow dystrophic-like mycotic.  Assessment: Pain in limb secondary to onychomycosis neuroma third interspace left peripheral vascular disease.  Plan: Debridement of toenails 1 through 5 bilaterally.  Also reinjected his left foot again today refilled 21 tablets of Percocet and will follow up with him in 3 weeks.

## 2020-12-03 ENCOUNTER — Ambulatory Visit (HOSPITAL_COMMUNITY)
Admission: RE | Admit: 2020-12-03 | Discharge: 2020-12-03 | Disposition: A | Discharge status: Home / Self Care | Payer: Medicare Other | Source: Ambulatory Visit | Attending: Vascular Surgery | Admitting: Vascular Surgery

## 2020-12-03 ENCOUNTER — Other Ambulatory Visit: Payer: Self-pay

## 2020-12-03 ENCOUNTER — Ambulatory Visit (INDEPENDENT_AMBULATORY_CARE_PROVIDER_SITE_OTHER): Payer: Medicare Other | Admitting: Vascular Surgery

## 2020-12-03 ENCOUNTER — Encounter: Payer: Self-pay | Admitting: Vascular Surgery

## 2020-12-03 VITALS — BP 123/77 | HR 85 | Temp 97.9°F | Resp 20 | Ht 74.0 in | Wt 164.0 lb

## 2020-12-03 DIAGNOSIS — I739 Peripheral vascular disease, unspecified: Secondary | ICD-10-CM | POA: Insufficient documentation

## 2020-12-03 DIAGNOSIS — I70222 Atherosclerosis of native arteries of extremities with rest pain, left leg: Secondary | ICD-10-CM

## 2020-12-03 NOTE — Progress Notes (Signed)
ASSESSMENT & PLAN:  79 y.o. male with atherosclerosis of bilateral lower extremities with left lower extremity rest pain.  Abdomens are a bit atypical, but he does have evidence of disease on exam and noninvasive testing. I have offered him angiography, with the understanding that we may not be able to palliate all of his symptoms.  He is understanding and wishes to proceed.  We will plan for aortogram, bilateral lower extremity angiogram via right common femoral artery access 12/18/2020.  CHIEF COMPLAINT:   Left foot pain  HISTORY:  HISTORY OF PRESENT ILLNESS: Russell [name] is a 79 y.o. male referred to our office for evaluation of left and calf foot pain.  Ports the pain is constant.  He reports nothing improves or worsens the pain.  Dependency does not improve the pain.  He is able to walk slowly.  No ulceration about the feet bilaterally.  Past Medical History:  Diagnosis Date  . AKI (acute kidney injury) ([place]) 06/23/2017  . Arthritis    SHOUDLERS, LUMBAR  . BPH with obstruction/lower urinary tract symptoms   . BPH with urinary obstruction 01/15/2016  . COPD (chronic obstructive pulmonary disease) ([place])   . Coronary atherosclerosis    denied knowledge of this 10/27/16  . Depression   . Diverticulosis of colon   . Dyspnea   . ED (erectile dysfunction)   . Full dentures   . GERD (gastroesophageal reflux disease)    not current  . Glaucoma   . History of gastritis   . History of scabies    2008  . 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 History of syncope    03-22-2014  DX  VAGAL RESPONSE  . Hyperlipidemia, mixed   . Hypertension   . Mitral regurgitation   . Peripheral arterial disease ([place])    one vessel runoff bilaterally via peroneal arteries  . Prostate cancer ([place])   . Seizures ([place]) 03/2014   ? or syncope- patient refused to be admitted for further studies- "felt better"  . Type 2 diabetes, diet controlled ([place])    patient and his  wife said no- have not been told 01/21/17  . Wears glasses     Past Surgical History:  Procedure Laterality Date  . ANTERIOR CERVICAL DECOMP/DISCECTOMY FUSION  10-16-2009   C4 -- C6  . ANTERIOR CERVICAL DECOMP/DISCECTOMY FUSION N/A 01/27/2017   Procedure: ANTERIOR CERVICAL DECOMPRESSION FUSION CERVICAL 3-4 WITH INSTRUMENTATION AND ALLOGRAFT;  Surgeon: Phylliss Bob, MD;  Location: [place];  Service: Orthopedics;  Laterality: N/A;  ANTERIOR CERVICAL DECOMPRESSION FUSION CERVICAL 3-4 WITH INSTRUMENTATION AND ALLOGRAFT  . CAPSULOTOMY Right 01/26/2013   Procedure: MINOR CAPSULOTOMY;  Surgeon: Myrtha Mantis., MD;  Location: [place];  Service: Ophthalmology;  Laterality: Right;  . CARPAL TUNNEL RELEASE Left 01/02/2019   Procedure: LEFT CARPAL TUNNEL RELEASE;  Surgeon: Leanora Cover, MD;  Location: [place];  Service: Orthopedics;  Laterality: Left;  . CARPECTOMY Right 03/13/2014   Procedure: RIGHT  PROXIMAL ROW CARPECTOMY;  Surgeon: Tennis Must, MD;  Location: [place];  Service: Orthopedics;  Laterality: Right;  . CARPECTOMY Left 10/22/2015   Procedure: LEFT WRIST PROXIMAL ROW CARPECTOMY ;  Surgeon: Leanora Cover, MD;  Location: [place];  Service: Orthopedics;  Laterality: Left;  . CARPOMETACARPAL ([place]) FUSION OF THUMB Right 03/13/2014   Procedure: RIGHT FUSION OF THUMB CARPOMETACARPAL [facility]) JOINT;  Surgeon: Tennis Must, MD;  Location: [place];  Service: Orthopedics;  Laterality: Right;  . CARPOMETACARPEL SUSPENSION PLASTY Left 04/04/2020   Procedure: LEFT THUMB TRAPECIECTOMY AND SUSPENSIONPLASTY;  Surgeon: Leanora Cover, MD;  Location: [place];  Service: Orthopedics;  Laterality: Left;  . CATARACT EXTRACTION W/ INTRAOCULAR LENS  IMPLANT, BILATERAL  2009  . COLONOSCOPY  08-31-2007  . COLONOSCOPY    . CRYOABLATION N/A 01/14/2015   Procedure: CRYO ABLATION PROSTATE;  Surgeon: Ailene Rud, MD;  Location:  [facility];  Service: Urology;  Laterality: N/A;  . ESOPHAGOGASTRODUODENOSCOPY  last one 09-11-2008  . EXCISION MASS LEFT FACE , SUBORBITAL AREA, PLASTER RECONSTRUCTION  02-09-2011  . EXCISIONAL DEBRIDEMENT AND REPAIR RIGHT QUADRICEP TENDON  07-05-2000  . FOOT SURGERY Left   . I & D EXTREMITY Right 05/24/2013   Procedure: RIGHT INDEX WOUND DEBRIDEMENT AND CLOSURE;  Surgeon: Jolyn Nap, MD;  Location: [place];  Service: Orthopedics;  Laterality: Right;  . INSERTION OF SUPRAPUBIC CATHETER N/A 01/14/2015   Procedure: SUPRAPUBIC TUBE PLACEMENT;  Surgeon: Ailene Rud, MD;  [facility];  Service: Urology;  Laterality: N/A;  . KNEE ARTHROSCOPY Right 2008  . LARYNGOSCOPY AND ESOPHAGOSCOPY  06-13-2010   MARSUPIALIZATION LEFT LARGE VALLECULAR CYST  . MASS EXCISION Left 10/22/2015   Procedure: EXCISION MASS OF LEFT INDEX FINGER;  Surgeon: Leanora Cover, MD;  Location: [place];  Service: Orthopedics;  Laterality: Left;  . ORCHIECTOMY Left 02/24/2015   Procedure: SCROTAL EXPLORATION WITH LEFT ORCHIECTOMY;  Surgeon: Kathie Rhodes, MD;  Location: [place];  Service: Urology;  Laterality: Left;  . POSTERIOR CERVICAL FUSION/FORAMINOTOMY N/A 01/28/2017   Procedure: POSTERIOR SPINAL FUSION CERVICAL 3-4, CERVICAL 4-5, CERVICAL 5-6, CERVICAL 6-7 WITH INSTRUMENATION AND ALLOGRAFT;  Surgeon: Phylliss Bob, MD;  Location: [place];  Service: Orthopedics;  Laterality: N/A;  POSTERIOR SPINAL FUSION CERVICAL 3-4, CERVICAL 4-5, CERVICAL 5-6, CERVICAL 6-7 WITH INSTRUMENATION AND ALLOGRAFT  . SHOULDER ARTHROSCOPY/ DEBRIDEMENT LABRAL TEAR/  BICEPS TENOTOMY Left 09-24-2011  . TONSILLECTOMY  as child  . TRANSTHORACIC ECHOCARDIOGRAM  02-21-2010   normal LVF/  ef 55-60%/ mild to moderate MR/  moderate LAE/  mild TR  . YAG LASER APPLICATION Right 05/25/7902   Procedure: YAG LASER APPLICATION;  Surgeon: Myrtha Mantis., MD;  Location: [place];   Service: Ophthalmology;  Laterality: Right;  . YAG LASER CAPSULOTOMY, LEFT EYE  01-08-2011    Family History  Problem Relation Age of Onset  . Unexplained death Mother        died at a young age, no known cause  . Heart attack Father        age unknown  . Diabetes Other   . Cancer Other     Social History   Socioeconomic History  . Marital status: Married    Spouse name: Not on file  . Number of children: 3  . Years of education: 49  . Highest education level: High school graduate  Occupational History  . Occupation: Retired - poured concrete  Tobacco Use  . Smoking status: Current Every Day Smoker    Packs/day: 1.00    Years: 59.00    Pack years: 59.00    Types: Cigarettes  . Smokeless tobacco: Never Used  . Tobacco comment: Started Chantix 10/26/16  Vaping Use  . Vaping Use: Never used  Substance and Sexual Activity  . Alcohol use: No    Comment: none since approx 2014- heavy before  . Drug  use: No  . Sexual activity: Not on file  Other Topics Concern  . Not on file  Social History Narrative   Lives with wife and 2 of his children in a one story home.  Has 3 children.  Retired Counsellor.  Education: high school.   Right Handed   Social Determinants of Health   Financial Resource Strain: Not on file  Food Insecurity: Not on file  Transportation Needs: Not on file  Physical Activity: Not on file  Stress: Not on file  Social Connections: Not on file  Intimate Partner Violence: Not on file    Allergies  Allergen Reactions  . Tramadol     Other reaction(s): Confusion (intolerance)    Current Outpatient Medications  Medication Sig Dispense Refill  . amitriptyline (ELAVIL) 25 MG tablet Take 25 mg by mouth at bedtime.    . AZOPT 1 % ophthalmic suspension Place 1 drop into the left eye 3 (three) times daily.     . bimatoprost (LUMIGAN) 0.01 % SOLN Place 1 drop into both eyes at bedtime. 30 mL 3  . brimonidine (ALPHAGAN) 0.2 % ophthalmic solution INT 1  GTT IN OU BID    . brimonidine-timolol (COMBIGAN) 0.2-0.5 % ophthalmic solution Place 1 drop into both eyes 2 (two) times daily.     Marland Kitchen esomeprazole (NEXIUM) 40 MG capsule Take 1 capsule (40 mg total) by mouth daily. (Patient taking differently: Take 40 mg by mouth as needed.) 30 capsule 2  . ferrous sulfate 325 (65 FE) MG tablet Take 325 mg by mouth daily.     Marland Kitchen gabapentin (NEURONTIN) 300 MG capsule Take 1 capsule (300 mg total) by mouth at bedtime. 90 capsule 3  . hydrochlorothiazide (HYDRODIURIL) 12.5 MG tablet Take 12.5 mg by mouth daily.   3  . losartan (COZAAR) 100 MG tablet Take 100 mg by mouth daily.  4  . meclizine (ANTIVERT) 25 MG tablet Take 25 mg by mouth daily.    . Multiple Vitamins-Minerals (CENTRUM SILVER ADULT 50+ PO) Take 1 tablet by mouth daily.    Marland Kitchen MYRBETRIQ 25 MG TB24 tablet Take 25 mg by mouth daily.    . Omega-3 Fatty Acids (FISH OIL) 1000 MG CPDR Take 1,000 mg by mouth daily.    Marland Kitchen oxybutynin (DITROPAN) 5 MG tablet Take 5 mg by mouth every morning.    . pravastatin (PRAVACHOL) 80 MG tablet Take 80 mg by mouth daily.   2  . tamsulosin (FLOMAX) 0.4 MG CAPS capsule Take 0.4 mg by mouth at bedtime.      No current facility-administered medications for this visit.    REVIEW OF SYSTEMS:  [X]  denotes positive finding, [ ]  denotes negative finding Cardiac  Comments:  Chest pain or chest pressure:    Shortness of breath upon exertion:    Short of breath when lying flat:    Irregular heart rhythm:        Vascular    Pain in calf, thigh, or hip brought on by ambulation:    Pain in feet at night that wakes you up from your sleep:     Blood clot in your veins:    Leg swelling:         Pulmonary    Oxygen at home:    Productive cough:     Wheezing:         Neurologic    Sudden weakness in arms or legs:     Sudden numbness in arms or legs:  Sudden onset of difficulty speaking or slurred speech:    Temporary loss of vision in one eye:     Problems with dizziness:          Gastrointestinal    Blood in stool:     Vomited blood:         Genitourinary    Burning when urinating:     Blood in urine:        Psychiatric    Major depression:         Hematologic    Bleeding problems:    Problems with blood clotting too easily:        Skin    Rashes or ulcers:        Constitutional    Fever or chills:     PHYSICAL EXAM:   Vitals:   12/03/20 0952  BP: 123/77  Pulse: 85  Resp: 20  Temp: 97.9 F (36.6 C)  SpO2: 100%  Weight: 164 lb (74.4 kg)  Height: 6\' 2"  (1.88 m)   Constitutional: Elderly, thin in no distress. Appears well nourished.  Neurologic: Normal gait and station. CN intact. No weakness. No sensory loss. Psychiatric: Mood and affect symmetric and appropriate. Eyes: No icterus. No conjunctival pallor. Ears, nose, throat: mucous membranes moist. Midline trachea. No carotid bruit. Cardiac: regular rate and rhythm.  Respiratory: unlabored. Abdominal: soft, non-tender, non-distended. No palpable pulsatile abdominal mass. Peripheral vascular:  Femoral pulse: L 2+ / R 2+  Popliteal pulse: L 1+ / R 1+  Dorsalis pedis pulse: L absent / R absent  Posterior tibial pulse: L absent / R absent Extremity: No edema. No cyanosis. No pallor.  Skin: No gangrene. No ulceration.  Lymphatic: No Stemmer's sign. No palpable lymphadenopathy.   DATA REVIEW:    Most recent CBC CBC Latest Ref Rng & Units 01/14/2019 08/12/2018 04/16/2018  WBC 4.0 - 10.5 K/uL 9.2 13.6(H) 9.7  Hemoglobin 13.0 - 17.0 g/dL 10.4(L) 10.6(L) 11.7(L)  Hematocrit 39.0 - 52.0 % 34.9(L) 35.4(L) 37.2(L)  Platelets 150 - 400 K/uL 331 374 298     Most recent CMP CMP Latest Ref Rng & Units 04/01/2020 01/14/2019 01/10/2019  Glucose 70 - 99 mg/dL 103(H) 114(H) -  BUN 8 - 23 mg/dL 21 19 -  Creatinine 0.61 - 1.24 mg/dL 1.50(H) 1.30(H) -  Sodium 135 - 145 mmol/L 139 138 -  Potassium 3.5 - 5.1 mmol/L 4.2 3.7 -  Chloride 98 - 111 mmol/L 104 103 -  CO2 22 - 32 mmol/L 24 21(L) -   Calcium 8.9 - 10.3 mg/dL 9.5 9.5 -  Total Protein 6.1 - 8.1 g/dL - - 7.1  Total Bilirubin 0.3 - 1.2 mg/dL - - -  Alkaline Phos 38 - 126 U/L - - -  AST 15 - 41 U/L - - -  ALT 0 - 44 U/L - - -    Renal function CrCl cannot be calculated (Patient's most recent lab result is older than the maximum 21 days allowed.).  Hgb A1c MFr Bld (%)  Date Value  02/24/2015 5.9 (H)    LDL Cholesterol  Date Value Ref Range Status  06/24/2017 55 0 - 99 mg/dL Final    Comment:           Total Cholesterol/HDL:CHD Risk Coronary Heart Disease Risk Table                     Men   Women  1/2 Average Risk   3.4   3.3  Average Risk       5.0   4.4  2 X Average Risk   9.6   7.1  3 X Average Risk  23.4   11.0        Use the calculated Patient Ratio above and the CHD Risk Table to determine the patient's CHD Risk.        ATP III CLASSIFICATION (LDL):  <100     mg/dL   Optimal  100-129  mg/dL   Near or Above                    Optimal  130-159  mg/dL   Borderline  160-189  mg/dL   High  >190     mg/dL   Very High      Vascular Imaging: ABI 12/03/20     Yevonne Aline. Stanford Breed, MD [facility] Phone Number: [phone] 12/03/2020 10:17 AM

## 2020-12-03 NOTE — H&P (View-Only) (Signed)
ASSESSMENT & PLAN:  79 y.o. male with atherosclerosis of bilateral lower extremities with left lower extremity rest pain.  Abdomens are a bit atypical, but he does have evidence of disease on exam and noninvasive testing. I have offered him angiography, with the understanding that we may not be able to palliate all of his symptoms.  He is understanding and wishes to proceed.  We will plan for aortogram, bilateral lower extremity angiogram via right common femoral artery access 12/18/2020.  CHIEF COMPLAINT:   Left foot pain  HISTORY:  HISTORY OF PRESENT ILLNESS: Russell [name] is a 79 y.o. male referred to our office for evaluation of left and calf foot pain.  Ports the pain is constant.  He reports nothing improves or worsens the pain.  Dependency does not improve the pain.  He is able to walk slowly.  No ulceration about the feet bilaterally.  Past Medical History:  Diagnosis Date  . AKI (acute kidney injury) ([place]) 06/23/2017  . Arthritis    SHOUDLERS, LUMBAR  . BPH with obstruction/lower urinary tract symptoms   . BPH with urinary obstruction 01/15/2016  . COPD (chronic obstructive pulmonary disease) ([place])   . Coronary atherosclerosis    denied knowledge of this 10/27/16  . Depression   . Diverticulosis of colon   . Dyspnea   . ED (erectile dysfunction)   . Full dentures   . GERD (gastroesophageal reflux disease)    not current  . Glaucoma   . History of gastritis   . History of scabies    2008  . 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 History of syncope    03-22-2014  DX  VAGAL RESPONSE  . Hyperlipidemia, mixed   . Hypertension   . Mitral regurgitation   . Peripheral arterial disease ([place])    one vessel runoff bilaterally via peroneal arteries  . Prostate cancer ([place])   . Seizures ([place]) 03/2014   ? or syncope- patient refused to be admitted for further studies- "felt better"  . Type 2 diabetes, diet controlled ([place])    patient and his  wife said no- have not been told 01/21/17  . Wears glasses     Past Surgical History:  Procedure Laterality Date  . ANTERIOR CERVICAL DECOMP/DISCECTOMY FUSION  10-16-2009   C4 -- C6  . ANTERIOR CERVICAL DECOMP/DISCECTOMY FUSION N/A 01/27/2017   Procedure: ANTERIOR CERVICAL DECOMPRESSION FUSION CERVICAL 3-4 WITH INSTRUMENTATION AND ALLOGRAFT;  Surgeon: Phylliss Bob, MD;  Location: [place];  Service: Orthopedics;  Laterality: N/A;  ANTERIOR CERVICAL DECOMPRESSION FUSION CERVICAL 3-4 WITH INSTRUMENTATION AND ALLOGRAFT  . CAPSULOTOMY Right 01/26/2013   Procedure: MINOR CAPSULOTOMY;  Surgeon: Myrtha Mantis., MD;  Location: [place];  Service: Ophthalmology;  Laterality: Right;  . CARPAL TUNNEL RELEASE Left 01/02/2019   Procedure: LEFT CARPAL TUNNEL RELEASE;  Surgeon: Leanora Cover, MD;  Location: [place];  Service: Orthopedics;  Laterality: Left;  . CARPECTOMY Right 03/13/2014   Procedure: RIGHT  PROXIMAL ROW CARPECTOMY;  Surgeon: Tennis Must, MD;  Location: [place];  Service: Orthopedics;  Laterality: Right;  . CARPECTOMY Left 10/22/2015   Procedure: LEFT WRIST PROXIMAL ROW CARPECTOMY ;  Surgeon: Leanora Cover, MD;  Location: [place];  Service: Orthopedics;  Laterality: Left;  . CARPOMETACARPAL ([place]) FUSION OF THUMB Right 03/13/2014   Procedure: RIGHT FUSION OF THUMB CARPOMETACARPAL [facility]) JOINT;  Surgeon: Tennis Must, MD;  Location: [place];  Service: Orthopedics;  Laterality: Right;  . CARPOMETACARPEL SUSPENSION PLASTY Left 04/04/2020   Procedure: LEFT THUMB TRAPECIECTOMY AND SUSPENSIONPLASTY;  Surgeon: Leanora Cover, MD;  Location: [place];  Service: Orthopedics;  Laterality: Left;  . CATARACT EXTRACTION W/ INTRAOCULAR LENS  IMPLANT, BILATERAL  2009  . COLONOSCOPY  08-31-2007  . COLONOSCOPY    . CRYOABLATION N/A 01/14/2015   Procedure: CRYO ABLATION PROSTATE;  Surgeon: Ailene Rud, MD;  Location:  [place];  Service: Urology;  Laterality: N/A;  . ESOPHAGOGASTRODUODENOSCOPY  last one 09-11-2008  . EXCISION MASS LEFT FACE , SUBORBITAL AREA, PLASTER RECONSTRUCTION  02-09-2011  . EXCISIONAL DEBRIDEMENT AND REPAIR RIGHT QUADRICEP TENDON  07-05-2000  . FOOT SURGERY Left   . I & D EXTREMITY Right 05/24/2013   Procedure: RIGHT INDEX WOUND DEBRIDEMENT AND CLOSURE;  Surgeon: Jolyn Nap, MD;  Location: [place];  Service: Orthopedics;  Laterality: Right;  . INSERTION OF SUPRAPUBIC CATHETER N/A 01/14/2015   Procedure: SUPRAPUBIC TUBE PLACEMENT;  Surgeon: Ailene Rud, MD;  Location: [facility];  Service: Urology;  Laterality: N/A;  . KNEE ARTHROSCOPY Right 2008  . LARYNGOSCOPY AND ESOPHAGOSCOPY  06-13-2010   MARSUPIALIZATION LEFT LARGE VALLECULAR CYST  . MASS EXCISION Left 10/22/2015   Procedure: EXCISION MASS OF LEFT INDEX FINGER;  Surgeon: Leanora Cover, MD;  Location: [place];  Service: Orthopedics;  Laterality: Left;  . ORCHIECTOMY Left 02/24/2015   Procedure: SCROTAL EXPLORATION WITH LEFT ORCHIECTOMY;  Surgeon: Kathie Rhodes, MD;  Location: [place];  Service: Urology;  Laterality: Left;  . POSTERIOR CERVICAL FUSION/FORAMINOTOMY N/A 01/28/2017   Procedure: POSTERIOR SPINAL FUSION CERVICAL 3-4, CERVICAL 4-5, CERVICAL 5-6, CERVICAL 6-7 WITH INSTRUMENATION AND ALLOGRAFT;  Surgeon: Phylliss Bob, MD;  Location: [place];  Service: Orthopedics;  Laterality: N/A;  POSTERIOR SPINAL FUSION CERVICAL 3-4, CERVICAL 4-5, CERVICAL 5-6, CERVICAL 6-7 WITH INSTRUMENATION AND ALLOGRAFT  . SHOULDER ARTHROSCOPY/ DEBRIDEMENT LABRAL TEAR/  BICEPS TENOTOMY Left 09-24-2011  . TONSILLECTOMY  as child  . TRANSTHORACIC ECHOCARDIOGRAM  02-21-2010   normal LVF/  ef 55-60%/ mild to moderate MR/  moderate LAE/  mild TR  . YAG LASER APPLICATION Right 02/21/5455   Procedure: YAG LASER APPLICATION;  Surgeon: Myrtha Mantis., MD;  Location: [place];   Service: Ophthalmology;  Laterality: Right;  . YAG LASER CAPSULOTOMY, LEFT EYE  01-08-2011    Family History  Problem Relation Age of Onset  . Unexplained death Mother        died at a young age, no known cause  . Heart attack Father        age unknown  . Diabetes Other   . Cancer Other     Social History   Socioeconomic History  . Marital status: Married    Spouse name: Not on file  . Number of children: 3  . Years of education: 13  . Highest education level: High school graduate  Occupational History  . Occupation: Retired - poured concrete  Tobacco Use  . Smoking status: Current Every Day Smoker    Packs/day: 1.00    Years: 59.00    Pack years: 59.00    Types: Cigarettes  . Smokeless tobacco: Never Used  . Tobacco comment: Started Chantix 10/26/16  Vaping Use  . Vaping Use: Never used  Substance and Sexual Activity  . Alcohol use: No    Comment: none since approx 2014- heavy before  . Drug  use: No  . Sexual activity: Not on file  Other Topics Concern  . Not on file  Social History Narrative   Lives with wife and 2 of his children in a one story home.  Has 3 children.  Retired Counsellor.  Education: high school.   Right Handed   Social Determinants of Health   Financial Resource Strain: Not on file  Food Insecurity: Not on file  Transportation Needs: Not on file  Physical Activity: Not on file  Stress: Not on file  Social Connections: Not on file  Intimate Partner Violence: Not on file    Allergies  Allergen Reactions  . Tramadol     Other reaction(s): Confusion (intolerance)    Current Outpatient Medications  Medication Sig Dispense Refill  . amitriptyline (ELAVIL) 25 MG tablet Take 25 mg by mouth at bedtime.    . AZOPT 1 % ophthalmic suspension Place 1 drop into the left eye 3 (three) times daily.     . bimatoprost (LUMIGAN) 0.01 % SOLN Place 1 drop into both eyes at bedtime. 30 mL 3  . brimonidine (ALPHAGAN) 0.2 % ophthalmic solution INT 1  GTT IN OU BID    . brimonidine-timolol (COMBIGAN) 0.2-0.5 % ophthalmic solution Place 1 drop into both eyes 2 (two) times daily.     Marland Kitchen esomeprazole (NEXIUM) 40 MG capsule Take 1 capsule (40 mg total) by mouth daily. (Patient taking differently: Take 40 mg by mouth as needed.) 30 capsule 2  . ferrous sulfate 325 (65 FE) MG tablet Take 325 mg by mouth daily.     Marland Kitchen gabapentin (NEURONTIN) 300 MG capsule Take 1 capsule (300 mg total) by mouth at bedtime. 90 capsule 3  . hydrochlorothiazide (HYDRODIURIL) 12.5 MG tablet Take 12.5 mg by mouth daily.   3  . losartan (COZAAR) 100 MG tablet Take 100 mg by mouth daily.  4  . meclizine (ANTIVERT) 25 MG tablet Take 25 mg by mouth daily.    . Multiple Vitamins-Minerals (CENTRUM SILVER ADULT 50+ PO) Take 1 tablet by mouth daily.    Marland Kitchen MYRBETRIQ 25 MG TB24 tablet Take 25 mg by mouth daily.    . Omega-3 Fatty Acids (FISH OIL) 1000 MG CPDR Take 1,000 mg by mouth daily.    Marland Kitchen oxybutynin (DITROPAN) 5 MG tablet Take 5 mg by mouth every morning.    . pravastatin (PRAVACHOL) 80 MG tablet Take 80 mg by mouth daily.   2  . tamsulosin (FLOMAX) 0.4 MG CAPS capsule Take 0.4 mg by mouth at bedtime.      No current facility-administered medications for this visit.    REVIEW OF SYSTEMS:  [X]  denotes positive finding, [ ]  denotes negative finding Cardiac  Comments:  Chest pain or chest pressure:    Shortness of breath upon exertion:    Short of breath when lying flat:    Irregular heart rhythm:        Vascular    Pain in calf, thigh, or hip brought on by ambulation:    Pain in feet at night that wakes you up from your sleep:     Blood clot in your veins:    Leg swelling:         Pulmonary    Oxygen at home:    Productive cough:     Wheezing:         Neurologic    Sudden weakness in arms or legs:     Sudden numbness in arms or legs:  Sudden onset of difficulty speaking or slurred speech:    Temporary loss of vision in one eye:     Problems with dizziness:          Gastrointestinal    Blood in stool:     Vomited blood:         Genitourinary    Burning when urinating:     Blood in urine:        Psychiatric    Major depression:         Hematologic    Bleeding problems:    Problems with blood clotting too easily:        Skin    Rashes or ulcers:        Constitutional    Fever or chills:     PHYSICAL EXAM:   Vitals:   12/03/20 0952  BP: 123/77  Pulse: 85  Resp: 20  Temp: 97.9 F (36.6 C)  SpO2: 100%  Weight: 164 lb (74.4 kg)  Height: 6\' 2"  (1.88 m)   Constitutional: Elderly, thin in no distress. Appears well nourished.  Neurologic: Normal gait and station. CN intact. No weakness. No sensory loss. Psychiatric: Mood and affect symmetric and appropriate. Eyes: No icterus. No conjunctival pallor. Ears, nose, throat: mucous membranes moist. Midline trachea. No carotid bruit. Cardiac: regular rate and rhythm.  Respiratory: unlabored. Abdominal: soft, non-tender, non-distended. No palpable pulsatile abdominal mass. Peripheral vascular:  Femoral pulse: L 2+ / R 2+  Popliteal pulse: L 1+ / R 1+  Dorsalis pedis pulse: L absent / R absent  Posterior tibial pulse: L absent / R absent Extremity: No edema. No cyanosis. No pallor.  Skin: No gangrene. No ulceration.  Lymphatic: No Stemmer's sign. No palpable lymphadenopathy.   DATA REVIEW:    Most recent CBC CBC Latest Ref Rng & Units 01/14/2019 08/12/2018 04/16/2018  WBC 4.0 - 10.5 K/uL 9.2 13.6(H) 9.7  Hemoglobin 13.0 - 17.0 g/dL 10.4(L) 10.6(L) 11.7(L)  Hematocrit 39.0 - 52.0 % 34.9(L) 35.4(L) 37.2(L)  Platelets 150 - 400 K/uL 331 374 298     Most recent CMP CMP Latest Ref Rng & Units 04/01/2020 01/14/2019 01/10/2019  Glucose 70 - 99 mg/dL 103(H) 114(H) -  BUN 8 - 23 mg/dL 21 19 -  Creatinine 0.61 - 1.24 mg/dL 1.50(H) 1.30(H) -  Sodium 135 - 145 mmol/L 139 138 -  Potassium 3.5 - 5.1 mmol/L 4.2 3.7 -  Chloride 98 - 111 mmol/L 104 103 -  CO2 22 - 32 mmol/L 24 21(L) -   Calcium 8.9 - 10.3 mg/dL 9.5 9.5 -  Total Protein 6.1 - 8.1 g/dL - - 7.1  Total Bilirubin 0.3 - 1.2 mg/dL - - -  Alkaline Phos 38 - 126 U/L - - -  AST 15 - 41 U/L - - -  ALT 0 - 44 U/L - - -    Renal function CrCl cannot be calculated (Patient's most recent lab result is older than the maximum 21 days allowed.).  Hgb A1c MFr Bld (%)  Date Value  02/24/2015 5.9 (H)    LDL Cholesterol  Date Value Ref Range Status  06/24/2017 55 0 - 99 mg/dL Final    Comment:           Total Cholesterol/HDL:CHD Risk Coronary Heart Disease Risk Table                     Men   Women  1/2 Average Risk   3.4   3.3  Average Risk       5.0   4.4  2 X Average Risk   9.6   7.1  3 X Average Risk  23.4   11.0        Use the calculated Patient Ratio above and the CHD Risk Table to determine the patient's CHD Risk.        ATP III CLASSIFICATION (LDL):  <100     mg/dL   Optimal  100-129  mg/dL   Near or Above                    Optimal  130-159  mg/dL   Borderline  160-189  mg/dL   High  >190     mg/dL   Very High      Vascular Imaging: ABI 12/03/20     Yevonne Aline. Stanford Breed, MD [facility] Phone Number: [phone] 12/03/2020 10:17 AM

## 2020-12-10 ENCOUNTER — Encounter: Payer: Self-pay | Admitting: Podiatry

## 2020-12-10 ENCOUNTER — Other Ambulatory Visit: Payer: Self-pay

## 2020-12-10 ENCOUNTER — Ambulatory Visit (INDEPENDENT_AMBULATORY_CARE_PROVIDER_SITE_OTHER): Payer: Medicare Other | Admitting: Podiatry

## 2020-12-10 DIAGNOSIS — G5762 Lesion of plantar nerve, left lower limb: Secondary | ICD-10-CM

## 2020-12-10 DIAGNOSIS — G5782 Other specified mononeuropathies of left lower limb: Secondary | ICD-10-CM

## 2020-12-10 MED ORDER — OXYCODONE-ACETAMINOPHEN 10-325 MG PO TABS: 1.0000 | ORAL_TABLET | Freq: Three times a day (TID) | ORAL | 0 refills | Status: AC | PRN

## 2020-12-10 NOTE — Progress Notes (Signed)
He presents today for follow-up of his neuroma third interdigital space.  He had just had his vascular studies completed which did demonstrate significant narrowing.  He is due for an angio the 29th to his left lower extremity.  He states that the pain is worse in his left leg at nighttime.  Dependency makes it feel better.  States the injections helped for about 2 weeks  Objective: Pulses are nonpalpable foot is cool to the touch.  Still has pain on the distal aspect of the third interdigital space left.  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  Plan: Await findings of the angio.  I will also go ahead and inject 1-1/2 cc of 4% dehydrated alcohol local anesthetic to the third interdigital space.  I provided him with 21 tablets of Percocet and I will follow-up with him in 3 weeks

## 2020-12-17 ENCOUNTER — Other Ambulatory Visit (HOSPITAL_COMMUNITY): Payer: Medicare Other

## 2020-12-18 ENCOUNTER — Ambulatory Visit (HOSPITAL_COMMUNITY)
Admission: RE | Admit: 2020-12-18 | Discharge: 2020-12-18 | Disposition: A | Discharge status: Home / Self Care | Payer: Medicare Other | Attending: Vascular Surgery | Admitting: Vascular Surgery

## 2020-12-18 ENCOUNTER — Ambulatory Visit (HOSPITAL_COMMUNITY)
Admission: RE | Disposition: A | Discharge status: Home / Self Care | Payer: Self-pay | Source: Home / Self Care | Attending: Vascular Surgery

## 2020-12-18 ENCOUNTER — Other Ambulatory Visit: Payer: Self-pay

## 2020-12-18 DIAGNOSIS — F17210 Nicotine dependence, cigarettes, uncomplicated: Secondary | ICD-10-CM | POA: Diagnosis not present

## 2020-12-18 DIAGNOSIS — I70222 Atherosclerosis of native arteries of extremities with rest pain, left leg: Secondary | ICD-10-CM | POA: Diagnosis present

## 2020-12-18 DIAGNOSIS — Z79899 Other long term (current) drug therapy: Secondary | ICD-10-CM | POA: Insufficient documentation

## 2020-12-18 DIAGNOSIS — Z20822 Contact with and (suspected) exposure to covid-19: Secondary | ICD-10-CM | POA: Insufficient documentation

## 2020-12-18 HISTORY — PX: ABDOMINAL AORTOGRAM W/LOWER EXTREMITY: CATH118223

## 2020-12-18 LAB — SARS CORONAVIRUS 2 BY RT PCR (HOSPITAL ORDER, PERFORMED IN ~~LOC~~ HOSPITAL LAB): SARS Coronavirus 2: NEGATIVE

## 2020-12-18 LAB — POCT I-STAT, CHEM 8
BUN: 31 mg/dL — ABNORMAL HIGH (ref 8–23)
Calcium, Ion: 1.26 mmol/L (ref 1.15–1.40)
Chloride: 109 mmol/L (ref 98–111)
Creatinine, Ser: 1.5 mg/dL — ABNORMAL HIGH (ref 0.61–1.24)
Glucose, Bld: 97 mg/dL (ref 70–99)
HCT: 34 % — ABNORMAL LOW (ref 39.0–52.0)
Hemoglobin: 11.6 g/dL — ABNORMAL LOW (ref 13.0–17.0)
Potassium: 4.7 mmol/L (ref 3.5–5.1)
Sodium: 142 mmol/L (ref 135–145)
TCO2: 23 mmol/L (ref 22–32)

## 2020-12-18 SURGERY — ABDOMINAL AORTOGRAM W/LOWER EXTREMITY
Anesthesia: LOCAL | Laterality: Left

## 2020-12-18 MED ORDER — SODIUM CHLORIDE 0.9 % WEIGHT BASED INFUSION: 1.0000 mL/kg/h | INTRAVENOUS | Status: DC

## 2020-12-18 MED ORDER — LABETALOL HCL 5 MG/ML IV SOLN: 10.0000 mg | INTRAVENOUS | Status: DC | PRN

## 2020-12-18 MED ORDER — IODIXANOL 320 MG/ML IV SOLN
INTRAVENOUS | Status: DC | PRN
  Administered 2020-12-18: 12:00:00 55 mL

## 2020-12-18 MED ORDER — HEPARIN SODIUM (PORCINE) 1000 UNIT/ML IJ SOLN
INTRAMUSCULAR | Status: AC
  Filled 2020-12-18: qty 1

## 2020-12-18 MED ORDER — SODIUM CHLORIDE 0.9 % IV SOLN: INTRAVENOUS | Status: DC

## 2020-12-18 MED ORDER — LIDOCAINE HCL (PF) 1 % IJ SOLN
INTRAMUSCULAR | Status: DC | PRN
  Administered 2020-12-18: 20 mL via INTRADERMAL

## 2020-12-18 MED ORDER — MIDAZOLAM HCL 2 MG/2ML IJ SOLN
INTRAMUSCULAR | Status: AC
  Filled 2020-12-18: qty 2

## 2020-12-18 MED ORDER — ASPIRIN EC 81 MG PO TBEC: 81.0000 mg | DELAYED_RELEASE_TABLET | Freq: Every day | ORAL | 2 refills | Status: DC

## 2020-12-18 MED ORDER — HEPARIN (PORCINE) IN NACL 1000-0.9 UT/500ML-% IV SOLN
INTRAVENOUS | Status: DC | PRN
  Administered 2020-12-18 (×2): 500 mL

## 2020-12-18 MED ORDER — LIDOCAINE HCL (PF) 1 % IJ SOLN
INTRAMUSCULAR | Status: AC
  Filled 2020-12-18: qty 30

## 2020-12-18 MED ORDER — ONDANSETRON HCL 4 MG/2ML IJ SOLN: 4.0000 mg | Freq: Four times a day (QID) | INTRAMUSCULAR | Status: DC | PRN

## 2020-12-18 MED ORDER — ASPIRIN EC 81 MG PO TBEC: 81.0000 mg | DELAYED_RELEASE_TABLET | Freq: Every day | ORAL | Status: DC

## 2020-12-18 MED ORDER — SODIUM CHLORIDE 0.9% FLUSH: 3.0000 mL | Freq: Two times a day (BID) | INTRAVENOUS | Status: DC

## 2020-12-18 MED ORDER — MIDAZOLAM HCL 2 MG/2ML IJ SOLN
INTRAMUSCULAR | Status: DC | PRN
  Administered 2020-12-18: 1 mg via INTRAVENOUS

## 2020-12-18 MED ORDER — FENTANYL CITRATE (PF) 100 MCG/2ML IJ SOLN
INTRAMUSCULAR | Status: DC | PRN
  Administered 2020-12-18: 50 ug via INTRAVENOUS

## 2020-12-18 MED ORDER — SODIUM CHLORIDE 0.9% FLUSH: 3.0000 mL | INTRAVENOUS | Status: DC | PRN

## 2020-12-18 MED ORDER — ACETAMINOPHEN 325 MG PO TABS: 650.0000 mg | ORAL_TABLET | ORAL | Status: DC | PRN

## 2020-12-18 MED ORDER — SODIUM CHLORIDE 0.9 % IV SOLN: 250.0000 mL | INTRAVENOUS | Status: DC | PRN

## 2020-12-18 MED ORDER — HYDRALAZINE HCL 20 MG/ML IJ SOLN: 5.0000 mg | INTRAMUSCULAR | Status: DC | PRN

## 2020-12-18 MED ORDER — FENTANYL CITRATE (PF) 100 MCG/2ML IJ SOLN
INTRAMUSCULAR | Status: AC
  Filled 2020-12-18: qty 2

## 2020-12-18 SURGICAL SUPPLY — 9 items
CATH OMNI FLUSH 5F 65CM (CATHETERS) ×1 IMPLANT
DEVICE CLOSURE MYNXGRIP 5F (Vascular Products) ×1 IMPLANT
GLIDEWIRE ADV .035X260CM (WIRE) ×1 IMPLANT
KIT MICROPUNCTURE NIT STIFF (SHEATH) ×1 IMPLANT
KIT PV (KITS) ×2 IMPLANT
SHEATH PINNACLE 5F 10CM (SHEATH) ×1 IMPLANT
SYR MEDRAD MARK V 150ML (SYRINGE) ×1 IMPLANT
TRANSDUCER W/STOPCOCK (MISCELLANEOUS) ×2 IMPLANT
TRAY PV CATH (CUSTOM PROCEDURE TRAY) ×2 IMPLANT

## 2020-12-18 NOTE — Progress Notes (Signed)
Client advised he needs to lie down in bed and he refused and is sitting straight up

## 2020-12-18 NOTE — Interval H&P Note (Signed)
History and Physical Interval Note:  12/18/2020 9:37 AM  Russell [name]  has presented today for surgery, with the diagnosis of peripheral vascular disease.  The various methods of treatment have been discussed with the patient and family. After consideration of risks, benefits and other options for treatment, the patient has consented to  Procedure(s): ABDOMINAL AORTOGRAM W/LOWER EXTREMITY (N/A) as a surgical intervention.  The patient's history has been reviewed, patient examined, no change in status, stable for surgery.  I have reviewed the patient's chart and labs.  Questions were answered to the patient's satisfaction.     Leonie Douglas

## 2020-12-18 NOTE — Discharge Instructions (Signed)
Femoral Site Care This sheet gives you information about how to care for yourself after your procedure. Your health care provider may also give you more specific instructions. If you have problems or questions, contact your health care provider. What can I expect after the procedure? After the procedure, it is common to have:  Bruising that usually fades within 1-2 weeks.  Tenderness at the site. Follow these instructions at home: Wound care  Follow instructions from your health care provider about how to take care of your insertion site. Make sure you: ? Wash your hands with soap and water before you change your bandage (dressing). If soap and water are not available, use hand sanitizer. ? Change your dressing as told by your health care provider. ? Leave stitches (sutures), skin glue, or adhesive strips in place. These skin closures may need to stay in place for 2 weeks or longer. If adhesive strip edges start to loosen and curl up, you may trim the loose edges. Do not remove adhesive strips completely unless your health care provider tells you to do that.  Do not take baths, swim, or use a hot tub until your health care provider approves.  You may shower 24-48 hours after the procedure or as told by your health care provider. ? Gently wash the site with plain soap and water. ? Pat the area dry with a clean towel. ? Do not rub the site. This may cause bleeding.  Do not apply powder or lotion to the site. Keep the site clean and dry.  Check your femoral site every day for signs of infection. Check for: ? Redness, swelling, or pain. ? Fluid or blood. ? Warmth. ? Pus or a bad smell. Activity  For the first 2-3 days after your procedure, or as long as directed: ? Avoid climbing stairs as much as possible. ? Do not squat.  Do not lift anything that is heavier than 10 lb (4.5 kg), or the limit that you are told, until your health care provider says that it is safe.  Rest as  directed. ? Avoid sitting for a long time without moving. Get up to take short walks every 1-2 hours.  Do not drive for 24 hours if you were given a medicine to help you relax (sedative). General instructions  Take over-the-counter and prescription medicines only as told by your health care provider.  Keep all follow-up visits as told by your health care provider. This is important. Contact a health care provider if you have:  A fever or chills.  You have redness, swelling, or pain around your insertion site. Get help right away if:  The catheter insertion area swells very fast.  You pass out.  You suddenly start to sweat or your skin gets clammy.  The catheter insertion area is bleeding, and the bleeding does not stop when you hold steady pressure on the area.  The area near or just beyond the catheter insertion site becomes pale, cool, tingly, or numb. These symptoms may represent a serious problem that is an emergency. Do not wait to see if the symptoms will go away. Get medical help right away. Call your local emergency services (911 in the [place] Do not drive yourself to the hospital. Summary  After the procedure, it is common to have bruising that usually fades within 1-2 weeks.  Check your femoral site every day for signs of infection.  Do not lift anything that is heavier than 10 lb (4.5 kg), or the   limit that you are told, until your health care provider says that it is safe. This information is not intended to replace advice given to you by your health care provider. Make sure you discuss any questions you have with your health care provider. Document Revised: 12/20/2017 Document Reviewed: 12/20/2017 Elsevier Patient Education  2020 [employer]vier Inc.  

## 2020-12-18 NOTE — Progress Notes (Signed)
Client up and walked and tolerated well; right groin stable, no bleeding or hematoma 

## 2020-12-18 NOTE — Op Note (Signed)
DATE OF SERVICE: 12/18/2020  PATIENT:  Russell [name]  79 y.o. male  PRE-OPERATIVE DIAGNOSIS:  peripheral vascular disease  POST-OPERATIVE DIAGNOSIS:  * No post-op diagnosis entered *  PROCEDURE:   1) US guided RCFA access 2) LLE angiography with second order cannulation (28mL contrast) 3) Conscious sedation (21 minutes)  SURGEON:  Surgeon(s) and Role:    * Leonie Douglas, MD - Primary  ASSISTANT: none  ANESTHESIA:   local and IV sedation  EBL: min  BLOOD ADMINISTERED:none  DRAINS: none   LOCAL MEDICATIONS USED:  LIDOCAINE   SPECIMEN:  none  COUNTS: confirmed correct.  TOURNIQUET:  * No tourniquets in log *  PATIENT DISPOSITION:  PACU - hemodynamically stable.   Delay start of Pharmacological VTE agent (>24hrs) due to surgical blood loss or risk of bleeding: no  INDICATION FOR PROCEDURE: Russell [name] is a 79 y.o. male with severe left foot pain and non-invasive vascular lab evidence of PAD. After careful discussion of risks, benefits, and alternatives the patient was offered angiogram. The patient understood and wished to proceed.  OPERATIVE FINDINGS:  Aorta: atherosclerosis without flow limiting disease Right iliac arteries: atherosclerosis without flow limiting disease Left iliac arteries: atherosclerosis without flow limiting disease Left femoral arteries: diffuse atherosclerosis 50-60% stenosis  Left popliteal arteries: diffuse atherosclerosis 50-60% stenosis  Left tibial trifurcation: severe disease. Occlusion of AT just past its origin. TP trunk heavily diseased - >85% stenosis. PT diminutive and tapers to occlusion. Peroneal heavily diseased - diffuse 70-85% stenosis throughout its course. Left tibial vessels: Peroneal only runoff to the foot.  Will bring patient back to office to discuss high risk for intervention. If he and family wish to proceed, we can do this electively as the patient only has rest pain. Retrograde access of DP island or careful  antegrade access with recanalization of AT may be best, lowest-risk option.   DESCRIPTION OF PROCEDURE: After identification of the patient in the pre-operative holding area, the patient was transferred to the operating room. The patient was positioned supine on the operating room table. Anesthesia was induced. The groins was prepped and draped in standard fashion. A surgical pause was performed confirming correct patient, procedure, and operative location.  To begin the right groin was anesthetized with 1% lidocaine. Using ultrasound guidance, the right common femoral artery was accessed with micropuncture technique. Fluoroscopy was used to confirm cannulation over the femoral head. Sheathogram was not performed. The 31F sheath was upsized to 60F.   An 035 glidewire advantage was advanced into the distal aorta. Over the wire an omni flush catheter was advanced to the level of L2. Aortogram was performed - see above for details.   The left common iliac artery was selected with the 035 glidewire advantage. The wire was advanced into the superficial femoral artery. Over the wire the omni flush catheter was advanced into the external iliac artery. Selective angiography was performed - see above for details.   The decision was made to not intervene. We were concerned about the high risk of creating a non-salvageable problem with poor, peroneal only runoff to the foot.   A mynx device was used to close the arteriotomy. Hemostasis was excellent upon completion.  Upon completion of the case instrument and sharps counts were confirmed correct. The patient was transferred to the PACU in good condition. I was present for all portions of the procedure.  Rande Brunt. Lenell Antu, MD [facility] Phone Number: [phone] 12/18/2020 12:26  PM

## 2020-12-19 ENCOUNTER — Encounter (HOSPITAL_COMMUNITY): Payer: Self-pay | Admitting: Vascular Surgery

## 2020-12-19 MED FILL — Heparin Sodium (Porcine) Inj 1000 Unit/ML: INTRAMUSCULAR | Qty: 20 | Status: AC

## 2020-12-31 ENCOUNTER — Other Ambulatory Visit: Payer: Self-pay

## 2020-12-31 ENCOUNTER — Other Ambulatory Visit: Payer: Self-pay | Admitting: Podiatry

## 2020-12-31 ENCOUNTER — Ambulatory Visit (INDEPENDENT_AMBULATORY_CARE_PROVIDER_SITE_OTHER): Payer: Medicare Other | Admitting: Podiatry

## 2020-12-31 ENCOUNTER — Encounter: Payer: Self-pay | Admitting: Podiatry

## 2020-12-31 DIAGNOSIS — G5762 Lesion of plantar nerve, left lower limb: Secondary | ICD-10-CM

## 2020-12-31 DIAGNOSIS — G5782 Other specified mononeuropathies of left lower limb: Secondary | ICD-10-CM

## 2020-12-31 MED ORDER — OXYCODONE-ACETAMINOPHEN 10-325 MG PO TABS: 1.0000 | ORAL_TABLET | Freq: Three times a day (TID) | ORAL | 0 refills | Status: AC | PRN

## 2021-01-01 NOTE — Progress Notes (Signed)
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  Objective: Pulses are nonpalpable reviewed peripheral vascular study consistent with hardening of the arteries and small vessel disease.  He has pain on palpation third and interspace of the left foot.  Plan: Discussed etiology pathology conservative therapies this point of time went ahead and reinjected today with the dehydrated alcohol prescribed him more pain medication and he is to follow-up with vascular in February for reevaluation.

## 2021-01-03 ENCOUNTER — Other Ambulatory Visit: Payer: Self-pay

## 2021-01-03 DIAGNOSIS — I70222 Atherosclerosis of native arteries of extremities with rest pain, left leg: Secondary | ICD-10-CM

## 2021-01-21 ENCOUNTER — Inpatient Hospital Stay (HOSPITAL_COMMUNITY): Admission: RE | Admit: 2021-01-21 | Payer: Medicare Other | Source: Ambulatory Visit

## 2021-01-21 ENCOUNTER — Encounter: Payer: Self-pay | Admitting: Vascular Surgery

## 2021-01-21 ENCOUNTER — Encounter: Payer: Medicare Other | Admitting: Vascular Surgery

## 2021-01-31 ENCOUNTER — Other Ambulatory Visit: Payer: Self-pay

## 2021-01-31 DIAGNOSIS — I70222 Atherosclerosis of native arteries of extremities with rest pain, left leg: Secondary | ICD-10-CM

## 2021-02-04 ENCOUNTER — Other Ambulatory Visit: Payer: Self-pay | Admitting: Podiatry

## 2021-02-04 ENCOUNTER — Ambulatory Visit: Payer: Medicare Other | Admitting: Podiatry

## 2021-02-04 MED ORDER — OXYCODONE-ACETAMINOPHEN 10-325 MG PO TABS: 1.0000 | ORAL_TABLET | Freq: Three times a day (TID) | ORAL | 0 refills | Status: AC | PRN

## 2021-02-11 ENCOUNTER — Other Ambulatory Visit: Payer: Self-pay

## 2021-02-11 ENCOUNTER — Encounter: Payer: Self-pay | Admitting: Podiatry

## 2021-02-11 ENCOUNTER — Ambulatory Visit (INDEPENDENT_AMBULATORY_CARE_PROVIDER_SITE_OTHER): Payer: Medicare Other | Admitting: Podiatry

## 2021-02-11 DIAGNOSIS — I739 Peripheral vascular disease, unspecified: Secondary | ICD-10-CM

## 2021-02-11 DIAGNOSIS — G5782 Other specified mononeuropathies of left lower limb: Secondary | ICD-10-CM

## 2021-02-11 DIAGNOSIS — G5762 Lesion of plantar nerve, left lower limb: Secondary | ICD-10-CM

## 2021-02-11 DIAGNOSIS — Z789 Other specified health status: Secondary | ICD-10-CM

## 2021-02-11 MED ORDER — OXYCODONE-ACETAMINOPHEN 10-325 MG PO TABS: 1.0000 | ORAL_TABLET | Freq: Three times a day (TID) | ORAL | 0 refills | Status: AC | PRN

## 2021-02-11 NOTE — Progress Notes (Signed)
He presents today stating that he is supposed to go for an angio of his bilateral lower extremities next week or the week after.  He states that his foot is been hurting pretty badly and he would like's more pain medicine the possible and another injection.  Objective: Vital signs are stable he is alert oriented x3 pulses are nonpalpable left.  He has tenderness on palpation to the third interdigital space of the left foot where a stump neuroma is most likely present.  Assessment: Stump neuroma third interspace left.  Plan: At this point I highly recommended another injection dehydrated alcohol since this seems to keep his narcotic use down.  I also gave him a few more narcotics until he can follow-up with for his angio.  Once he gets better blood flow to the lower extremity if possible then we will consider surgical intervention.

## 2021-02-18 ENCOUNTER — Other Ambulatory Visit: Payer: Self-pay

## 2021-02-18 ENCOUNTER — Encounter: Payer: Self-pay | Admitting: Vascular Surgery

## 2021-02-18 ENCOUNTER — Ambulatory Visit (HOSPITAL_COMMUNITY)
Admission: RE | Admit: 2021-02-18 | Discharge: 2021-02-18 | Disposition: A | Discharge status: Home / Self Care | Payer: Medicare Other | Source: Ambulatory Visit | Attending: Vascular Surgery | Admitting: Vascular Surgery

## 2021-02-18 ENCOUNTER — Ambulatory Visit (INDEPENDENT_AMBULATORY_CARE_PROVIDER_SITE_OTHER)
Admission: RE | Admit: 2021-02-18 | Discharge: 2021-02-18 | Disposition: A | Discharge status: Home / Self Care | Payer: Medicare Other | Source: Ambulatory Visit | Attending: Vascular Surgery | Admitting: Vascular Surgery

## 2021-02-18 ENCOUNTER — Ambulatory Visit (INDEPENDENT_AMBULATORY_CARE_PROVIDER_SITE_OTHER): Payer: Medicare Other | Admitting: Vascular Surgery

## 2021-02-18 VITALS — BP 140/78 | HR 64 | Temp 97.7°F | Resp 20 | Ht 78.0 in | Wt 165.0 lb

## 2021-02-18 DIAGNOSIS — I70222 Atherosclerosis of native arteries of extremities with rest pain, left leg: Secondary | ICD-10-CM | POA: Diagnosis present

## 2021-02-18 NOTE — H&P (View-Only) (Signed)
ASSESSMENT & PLAN:  80 y.o. male with atherosclerosis of bilateral lower extremities with left lower extremity rest pain.  I had a detailed discussion with him about risks, benefits, alternatives to intervention.  We both agreed that we would try to do low risk interventions to improve inflow to the calf and avoid any intervention on the disadvantaged runoff.  We will plan to measure pressure gradient across the left common iliac lesion as well as the diffuse femoral-popliteal disease to see if either would benefit from intervention.    Plan angiogram 02/19/2021 of left lower extremity via right common femoral artery approach.  May need left common femoral artery access to treat left common iliac lesion.  CHIEF COMPLAINT:   Left foot pain  HISTORY:  HISTORY OF PRESENT ILLNESS: Russell [name] is a 80 y.o. male returns to care after angiogram 12/18/20.  I had done this for ischemic rest pain of the left lower extremity.  He has severely disadvantaged tibial flow to the foot.  At that time I elected not to intervene on him because of the risk to the heavily diseased peroneal only runoff. Patient returns to clinic today reporting continued discomfort in the right lower extremity.  He is very interested in an intervention.  He has no ischemic ulceration.  Past Medical History:  Diagnosis Date  . AKI (acute kidney injury) ([place]) 06/23/2017  . Arthritis    SHOUDLERS, LUMBAR  . BPH with obstruction/lower urinary tract symptoms   . BPH with urinary obstruction 01/15/2016  . COPD (chronic obstructive pulmonary disease) ([place])   . Coronary atherosclerosis    denied knowledge of this 10/27/16  . Depression   . Diverticulosis of colon   . Dyspnea   . ED (erectile dysfunction)   . Full dentures   . GERD (gastroesophageal reflux disease)    not current  . Glaucoma   . History of gastritis   . History of scabies    2008  . 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 History  of syncope    03-22-2014  DX  VAGAL RESPONSE  . Hyperlipidemia, mixed   . Hypertension   . Mitral regurgitation   . Peripheral arterial disease ([place])    one vessel runoff bilaterally via peroneal arteries  . Prostate cancer ([place])   . Seizures ([place]) 03/2014   ? or syncope- patient refused to be admitted for further studies- "felt better"  . Type 2 diabetes, diet controlled ([place])    patient and his wife said no- have not been told 01/21/17  . Wears glasses     Past Surgical History:  Procedure Laterality Date  . ABDOMINAL AORTOGRAM W/LOWER EXTREMITY Left 12/18/2020   Procedure: ABDOMINAL AORTOGRAM W/LOWER EXTREMITY;  Surgeon: Cherre Robins, MD;  Location: [facility];  Service: Cardiovascular;  Laterality: Left;  . ANTERIOR CERVICAL DECOMP/DISCECTOMY FUSION  10-16-2009   C4 -- C6  . ANTERIOR CERVICAL DECOMP/DISCECTOMY FUSION N/A 01/27/2017   Procedure: ANTERIOR CERVICAL DECOMPRESSION FUSION CERVICAL 3-4 WITH INSTRUMENTATION AND ALLOGRAFT;  Surgeon: Phylliss Bob, MD;  Location: [facility];  Service: Orthopedics;  Laterality: N/A;  ANTERIOR CERVICAL DECOMPRESSION FUSION CERVICAL 3-4 WITH INSTRUMENTATION AND ALLOGRAFT  . CAPSULOTOMY Right 01/26/2013   Procedure: MINOR CAPSULOTOMY;  Surgeon: Myrtha Mantis., MD;  Location: [facility];  Service: Ophthalmology;  Laterality: Right;  . CARPAL TUNNEL RELEASE Left 01/02/2019   Procedure: LEFT CARPAL TUNNEL RELEASE;  Surgeon: Fredna Dow,  Lennette Bihari, MD;  Location: [facility];  Service: Orthopedics;  Laterality: Left;  . CARPECTOMY Right 03/13/2014   Procedure: RIGHT  PROXIMAL ROW CARPECTOMY;  Surgeon: Tennis Must, MD;  Location: [facility];  Service: Orthopedics;  Laterality: Right;  . CARPECTOMY Left 10/22/2015   Procedure: LEFT WRIST PROXIMAL ROW CARPECTOMY ;  Surgeon: Leanora Cover, MD;  Location: [facility];  Service: Orthopedics;  Laterality: Left;  . CARPOMETACARPAL ([place]) FUSION OF THUMB Right 03/13/2014    Procedure: RIGHT FUSION OF THUMB CARPOMETACARPAL [facility]) JOINT;  Surgeon: Tennis Must, MD;  Location: [facility];  Service: Orthopedics;  Laterality: Right;  . CARPOMETACARPEL SUSPENSION PLASTY Left 04/04/2020   Procedure: LEFT THUMB TRAPECIECTOMY AND SUSPENSIONPLASTY;  Surgeon: Leanora Cover, MD;  Location: [facility];  Service: Orthopedics;  Laterality: Left;  . CATARACT EXTRACTION W/ INTRAOCULAR LENS  IMPLANT, BILATERAL  2009  . COLONOSCOPY  08-31-2007  . COLONOSCOPY    . CRYOABLATION N/A 01/14/2015   Procedure: CRYO ABLATION PROSTATE;  Surgeon: Ailene Rud, MD;  Location: [facility];  Service: Urology;  Laterality: N/A;  . ESOPHAGOGASTRODUODENOSCOPY  last one 09-11-2008  . EXCISION MASS LEFT FACE , SUBORBITAL AREA, PLASTER RECONSTRUCTION  02-09-2011  . EXCISIONAL DEBRIDEMENT AND REPAIR RIGHT QUADRICEP TENDON  07-05-2000  . FOOT SURGERY Left   . I & D EXTREMITY Right 05/24/2013   Procedure: RIGHT INDEX WOUND DEBRIDEMENT AND CLOSURE;  Surgeon: Jolyn Nap, MD;  Location: [facility];  Service: Orthopedics;  Laterality: Right;  . INSERTION OF SUPRAPUBIC CATHETER N/A 01/14/2015   Procedure: SUPRAPUBIC TUBE PLACEMENT;  Surgeon: Ailene Rud, MD;  Location[facility];  Service: Urology;  Laterality: N/A;  . KNEE ARTHROSCOPY Right 2008  . LARYNGOSCOPY AND ESOPHAGOSCOPY  06-13-2010   MARSUPIALIZATION LEFT LARGE VALLECULAR CYST  . MASS EXCISION Left 10/22/2015   Procedure: EXCISION MASS OF LEFT INDEX FINGER;  Surgeon: Leanora Cover, MD;  Location: [place];  Service: Orthopedics;  Laterality: Left;  . ORCHIECTOMY Left 02/24/2015   Procedure: SCROTAL EXPLORATION WITH LEFT ORCHIECTOMY;  Surgeon: Kathie Rhodes, MD;  Location: [facility];  Service: Urology;  Laterality: Left;  . POSTERIOR CERVICAL FUSION/FORAMINOTOMY N/A 01/28/2017   Procedure: POSTERIOR SPINAL FUSION CERVICAL 3-4, CERVICAL 4-5, CERVICAL  5-6, CERVICAL 6-7 WITH INSTRUMENATION AND ALLOGRAFT;  Surgeon: Phylliss Bob, MD;  Location: [facility];  Service: Orthopedics;  Laterality: N/A;  POSTERIOR SPINAL FUSION CERVICAL 3-4, CERVICAL 4-5, CERVICAL 5-6, CERVICAL 6-7 WITH INSTRUMENATION AND ALLOGRAFT  . SHOULDER ARTHROSCOPY/ DEBRIDEMENT LABRAL TEAR/  BICEPS TENOTOMY Left 09-24-2011  . TONSILLECTOMY  as child  . TRANSTHORACIC ECHOCARDIOGRAM  02-21-2010   normal LVF/  ef 55-60%/ mild to moderate MR/  moderate LAE/  mild TR  . YAG LASER APPLICATION Right 08/24/8545   Procedure: YAG LASER APPLICATION;  Surgeon: Myrtha Mantis., MD;  Location: [place];  Service: Ophthalmology;  Laterality: Right;  . YAG LASER CAPSULOTOMY, LEFT EYE  01-08-2011    Family History  Problem Relation Age of Onset  . Unexplained death Mother        died at a young age, no known cause  . Heart attack Father        age unknown  . Diabetes Other   . Cancer Other     Social History   Socioeconomic History  . Marital status: Married    Spouse name: Not on file  . Number of children:  3  . Years of education: 6  . Highest education level: High school graduate  Occupational History  . Occupation: Retired - poured concrete  Tobacco Use  . Smoking status: Current Every Day Smoker    Packs/day: 1.00    Years: 59.00    Pack years: 59.00    Types: Cigarettes  . Smokeless tobacco: Never Used  . Tobacco comment: Started Chantix 10/26/16  Vaping Use  . Vaping Use: Never used  Substance and Sexual Activity  . Alcohol use: No    Comment: none since approx 2014- heavy before  . Drug use: No  . Sexual activity: Not on file  Other Topics Concern  . Not on file  Social History Narrative   Lives with wife and 2 of his children in a one story home.  Has 3 children.  Retired Counsellor.  Education: high school.   Right Handed   Social Determinants of Health   Financial Resource Strain: Not on file  Food Insecurity: Not on file  Transportation Needs:  Not on file  Physical Activity: Not on file  Stress: Not on file  Social Connections: Not on file  Intimate Partner Violence: Not on file    Allergies  Allergen Reactions  . Tramadol     Other reaction(s): Confusion (intolerance)    Current Outpatient Medications  Medication Sig Dispense Refill  . amitriptyline (ELAVIL) 25 MG tablet Take 25 mg by mouth at bedtime.    Marland Kitchen aspirin EC 81 MG tablet Take 1 tablet (81 mg total) by mouth daily. Swallow whole. 150 tablet 2  . AZOPT 1 % ophthalmic suspension Place 1 drop into both eyes 3 (three) times daily.    . bimatoprost (LUMIGAN) 0.01 % SOLN Place 1 drop into both eyes at bedtime. 30 mL 3  . brimonidine (ALPHAGAN) 0.2 % ophthalmic solution Place 1 drop into both eyes in the morning and at bedtime.    Marland Kitchen esomeprazole (NEXIUM) 40 MG capsule Take 1 capsule (40 mg total) by mouth daily. (Patient taking differently: Take 40 mg by mouth daily as needed (acid reflux/indigestion.).) 30 capsule 2  . Ferrous Sulfate ([employer] SLOW RELEASE IRON) 143 (45 Fe) MG TBCR Take 45 mg by mouth daily.    Marland Kitchen losartan (COZAAR) 100 MG tablet Take 100 mg by mouth daily.  4  . meclizine (ANTIVERT) 25 MG tablet Take 25 mg by mouth daily as needed for dizziness.    . Multiple Vitamin (MULTIVITAMIN WITH MINERALS) TABS tablet Take 1 tablet by mouth daily.    Marland Kitchen MYRBETRIQ 25 MG TB24 tablet Take 25 mg by mouth daily.    . Omega-3 Fatty Acids (FISH OIL) 1000 MG CPDR Take 1,000 mg by mouth daily.    Marland Kitchen oxybutynin (DITROPAN) 5 MG tablet Take 5 mg by mouth every morning.    Marland Kitchen oxyCODONE-acetaminophen (PERCOCET) 10-325 MG tablet Take 1 tablet by mouth every 8 (eight) hours as needed for up to 7 days for pain. 21 tablet 0  . pravastatin (PRAVACHOL) 80 MG tablet Take 80 mg by mouth daily.   2  . tamsulosin (FLOMAX) 0.4 MG CAPS capsule Take 0.4 mg by mouth at bedtime.      No current facility-administered medications for this visit.    REVIEW OF SYSTEMS:  [X]  denotes positive finding,  [ ]  denotes negative finding Cardiac  Comments:  Chest pain or chest pressure:    Shortness of breath upon exertion:    Short of breath when lying flat:  Irregular heart rhythm:        Vascular    Pain in calf, thigh, or hip brought on by ambulation:    Pain in feet at night that wakes you up from your sleep:     Blood clot in your veins:    Leg swelling:         Pulmonary    Oxygen at home:    Productive cough:     Wheezing:         Neurologic    Sudden weakness in arms or legs:     Sudden numbness in arms or legs:     Sudden onset of difficulty speaking or slurred speech:    Temporary loss of vision in one eye:     Problems with dizziness:         Gastrointestinal    Blood in stool:     Vomited blood:         Genitourinary    Burning when urinating:     Blood in urine:        Psychiatric    Major depression:         Hematologic    Bleeding problems:    Problems with blood clotting too easily:        Skin    Rashes or ulcers:        Constitutional    Fever or chills:     PHYSICAL EXAM:   Vitals:   02/18/21 0853  BP: 140/78  Pulse: 64  Resp: 20  Temp: 97.7 F (36.5 C)  SpO2: 96%  Weight: 165 lb (74.8 kg)  Height: 6\' 6"  (1.981 m)   Constitutional: Elderly, thin in no distress. Appears well nourished.  Neurologic: Normal gait and station. CN intact. No weakness. No sensory loss. Psychiatric: Mood and affect symmetric and appropriate. Eyes: No icterus. No conjunctival pallor. Ears, nose, throat: mucous membranes moist. Midline trachea. No carotid bruit. Cardiac: regular rate and rhythm.  Respiratory: unlabored. Abdominal: soft, non-tender, non-distended. No palpable pulsatile abdominal mass. Peripheral vascular:  Femoral pulse: L 2+ / R 2+  Popliteal pulse: L 1+ / R 1+  Dorsalis pedis pulse: L absent / R absent  Posterior tibial pulse: L absent / R absent Extremity: No edema. No cyanosis. No pallor.  Skin: No gangrene. No ulceration.   Lymphatic: No Stemmer's sign. No palpable lymphadenopathy.   DATA REVIEW:    Most recent CBC CBC Latest Ref Rng & Units 12/18/2020 01/14/2019 08/12/2018  WBC 4.0 - 10.5 K/uL - 9.2 13.6(H)  Hemoglobin 13.0 - 17.0 g/dL 11.6(L) 10.4(L) 10.6(L)  Hematocrit 39.0 - 52.0 % 34.0(L) 34.9(L) 35.4(L)  Platelets 150 - 400 K/uL - 331 374     Most recent CMP CMP Latest Ref Rng & Units 12/18/2020 04/01/2020 01/14/2019  Glucose 70 - 99 mg/dL 97 103(H) 114(H)  BUN 8 - 23 mg/dL 31(H) 21 19  Creatinine 0.61 - 1.24 mg/dL 1.50(H) 1.50(H) 1.30(H)  Sodium 135 - 145 mmol/L 142 139 138  Potassium 3.5 - 5.1 mmol/L 4.7 4.2 3.7  Chloride 98 - 111 mmol/L 109 104 103  CO2 22 - 32 mmol/L - 24 21(L)  Calcium 8.9 - 10.3 mg/dL - 9.5 9.5  Total Protein 6.1 - 8.1 g/dL - - -  Total Bilirubin 0.3 - 1.2 mg/dL - - -  Alkaline Phos 38 - 126 U/L - - -  AST 15 - 41 U/L - - -  ALT 0 - 44 U/L - - -  Renal function CrCl cannot be calculated (Patient's most recent lab result is older than the maximum 21 days allowed.).  Hgb A1c MFr Bld (%)  Date Value  02/24/2015 5.9 (H)    LDL Cholesterol  Date Value Ref Range Status  06/24/2017 55 0 - 99 mg/dL Final    Comment:           Total Cholesterol/HDL:CHD Risk Coronary Heart Disease Risk Table                     Men   Women  1/2 Average Risk   3.4   3.3  Average Risk       5.0   4.4  2 X Average Risk   9.6   7.1  3 X Average Risk  23.4   11.0        Use the calculated Patient Ratio above and the CHD Risk Table to determine the patient's CHD Risk.        ATP III CLASSIFICATION (LDL):  <100     mg/dL   Optimal  100-129  mg/dL   Near or Above                    Optimal  130-159  mg/dL   Borderline  160-189  mg/dL   High  >190     mg/dL   Very High      Vascular Imaging: ABI 12/03/20     Yevonne Aline. Stanford Breed, MD [facility] Phone Number: [phone] 02/18/2021 10:00 AM

## 2021-02-18 NOTE — Progress Notes (Signed)
ASSESSMENT & PLAN:  80 y.o. male with atherosclerosis of bilateral lower extremities with left lower extremity rest pain.  I had a detailed discussion with him about risks, benefits, alternatives to intervention.  We both agreed that we would try to do low risk interventions to improve inflow to the calf and avoid any intervention on the disadvantaged runoff.  We will plan to measure pressure gradient across the left common iliac lesion as well as the diffuse femoral-popliteal disease to see if either would benefit from intervention.    Plan angiogram 02/19/2021 of left lower extremity via right common femoral artery approach.  May need left common femoral artery access to treat left common iliac lesion.  CHIEF COMPLAINT:   Left foot pain  HISTORY:  HISTORY OF PRESENT ILLNESS: Russell [name] is a 80 y.o. male returns to care after angiogram 12/18/20.  I had done this for ischemic rest pain of the left lower extremity.  He has severely disadvantaged tibial flow to the foot.  At that time I elected not to intervene on him because of the risk to the heavily diseased peroneal only runoff. Patient returns to clinic today reporting continued discomfort in the right lower extremity.  He is very interested in an intervention.  He has no ischemic ulceration.  Past Medical History:  Diagnosis Date  . AKI (acute kidney injury) ([place]) 06/23/2017  . Arthritis    SHOUDLERS, LUMBAR  . BPH with obstruction/lower urinary tract symptoms   . BPH with urinary obstruction 01/15/2016  . COPD (chronic obstructive pulmonary disease) ([place])   . Coronary atherosclerosis    denied knowledge of this 10/27/16  . Depression   . Diverticulosis of colon   . Dyspnea   . ED (erectile dysfunction)   . Full dentures   . GERD (gastroesophageal reflux disease)    not current  . Glaucoma   . History of gastritis   . History of scabies    2008  . 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 History  of syncope    03-22-2014  DX  VAGAL RESPONSE  . Hyperlipidemia, mixed   . Hypertension   . Mitral regurgitation   . Peripheral arterial disease ([place])    one vessel runoff bilaterally via peroneal arteries  . Prostate cancer ([place])   . Seizures ([place]) 03/2014   ? or syncope- patient refused to be admitted for further studies- "felt better"  . Type 2 diabetes, diet controlled ([place])    patient and his wife said no- have not been told 01/21/17  . Wears glasses     Past Surgical History:  Procedure Laterality Date  . ABDOMINAL AORTOGRAM W/LOWER EXTREMITY Left 12/18/2020   Procedure: ABDOMINAL AORTOGRAM W/LOWER EXTREMITY;  Surgeon: Cherre Robins, MD;  Location: [facility];  Service: Cardiovascular;  Laterality: Left;  . ANTERIOR CERVICAL DECOMP/DISCECTOMY FUSION  10-16-2009   C4 -- C6  . ANTERIOR CERVICAL DECOMP/DISCECTOMY FUSION N/A 01/27/2017   Procedure: ANTERIOR CERVICAL DECOMPRESSION FUSION CERVICAL 3-4 WITH INSTRUMENTATION AND ALLOGRAFT;  Surgeon: Phylliss Bob, MD;  Location: [facility];  Service: Orthopedics;  Laterality: N/A;  ANTERIOR CERVICAL DECOMPRESSION FUSION CERVICAL 3-4 WITH INSTRUMENTATION AND ALLOGRAFT  . CAPSULOTOMY Right 01/26/2013   Procedure: MINOR CAPSULOTOMY;  Surgeon: Myrtha Mantis., MD;  Location: [facility];  Service: Ophthalmology;  Laterality: Right;  . CARPAL TUNNEL RELEASE Left 01/02/2019   Procedure: LEFT CARPAL TUNNEL RELEASE;  Surgeon: Fredna Dow,  Lennette Bihari, MD;  Location: [facility];  Service: Orthopedics;  Laterality: Left;  . CARPECTOMY Right 03/13/2014   Procedure: RIGHT  PROXIMAL ROW CARPECTOMY;  Surgeon: Tennis Must, MD;  Location: [facility];  Service: Orthopedics;  Laterality: Right;  . CARPECTOMY Left 10/22/2015   Procedure: LEFT WRIST PROXIMAL ROW CARPECTOMY ;  Surgeon: Leanora Cover, MD;  Location: [facility];  Service: Orthopedics;  Laterality: Left;  . CARPOMETACARPAL ([place]) FUSION OF THUMB Right 03/13/2014    Procedure: RIGHT FUSION OF THUMB CARPOMETACARPAL [facility]) JOINT;  Surgeon: Tennis Must, MD;  Location: [facility];  Service: Orthopedics;  Laterality: Right;  . CARPOMETACARPEL SUSPENSION PLASTY Left 04/04/2020   Procedure: LEFT THUMB TRAPECIECTOMY AND SUSPENSIONPLASTY;  Surgeon: Leanora Cover, MD;  Location: [facility];  Service: Orthopedics;  Laterality: Left;  . CATARACT EXTRACTION W/ INTRAOCULAR LENS  IMPLANT, BILATERAL  2009  . COLONOSCOPY  08-31-2007  . COLONOSCOPY    . CRYOABLATION N/A 01/14/2015   Procedure: CRYO ABLATION PROSTATE;  Surgeon: Ailene Rud, MD;  Location: [facility];  Service: Urology;  Laterality: N/A;  . ESOPHAGOGASTRODUODENOSCOPY  last one 09-11-2008  . EXCISION MASS LEFT FACE , SUBORBITAL AREA, PLASTER RECONSTRUCTION  02-09-2011  . EXCISIONAL DEBRIDEMENT AND REPAIR RIGHT QUADRICEP TENDON  07-05-2000  . FOOT SURGERY Left   . I & D EXTREMITY Right 05/24/2013   Procedure: RIGHT INDEX WOUND DEBRIDEMENT AND CLOSURE;  Surgeon: Jolyn Nap, MD;  Location: [facility];  Service: Orthopedics;  Laterality: Right;  . INSERTION OF SUPRAPUBIC CATHETER N/A 01/14/2015   Procedure: SUPRAPUBIC TUBE PLACEMENT;  Surgeon: Ailene Rud, MD;  Location: [facility];  Service: Urology;  Laterality: N/A;  . KNEE ARTHROSCOPY Right 2008  . LARYNGOSCOPY AND ESOPHAGOSCOPY  06-13-2010   MARSUPIALIZATION LEFT LARGE VALLECULAR CYST  . MASS EXCISION Left 10/22/2015   Procedure: EXCISION MASS OF LEFT INDEX FINGER;  Surgeon: Leanora Cover, MD;  Location: [facility] [place];  Service: Orthopedics;  Laterality: Left;  . ORCHIECTOMY Left 02/24/2015   Procedure: SCROTAL EXPLORATION WITH LEFT ORCHIECTOMY;  Surgeon: Kathie Rhodes, MD;  Location: [facility] [facility];  Service: Urology;  Laterality: Left;  . POSTERIOR CERVICAL FUSION/FORAMINOTOMY N/A 01/28/2017   Procedure: POSTERIOR SPINAL FUSION CERVICAL 3-4, CERVICAL 4-5, CERVICAL  5-6, CERVICAL 6-7 WITH INSTRUMENATION AND ALLOGRAFT;  Surgeon: Phylliss Bob, MD;  Location: [facility];  Service: Orthopedics;  Laterality: N/A;  POSTERIOR SPINAL FUSION CERVICAL 3-4, CERVICAL 4-5, CERVICAL 5-6, CERVICAL 6-7 WITH INSTRUMENATION AND ALLOGRAFT  . SHOULDER ARTHROSCOPY/ DEBRIDEMENT LABRAL TEAR/  BICEPS TENOTOMY Left 09-24-2011  . TONSILLECTOMY  as child  . TRANSTHORACIC ECHOCARDIOGRAM  02-21-2010   normal LVF/  ef 55-60%/ mild to moderate MR/  moderate LAE/  mild TR  . YAG LASER APPLICATION Right 06/22/4192   Procedure: YAG LASER APPLICATION;  Surgeon: Myrtha Mantis., MD;  Location: [facility];  Service: Ophthalmology;  Laterality: Right;  . YAG LASER CAPSULOTOMY, LEFT EYE  01-08-2011    Family History  Problem Relation Age of Onset  . Unexplained death Mother        died at a young age, no known cause  . Heart attack Father        age unknown  . Diabetes Other   . Cancer Other     Social History   Socioeconomic History  . Marital status: Married    Spouse name: Not on file  . Number of children:  3  . Years of education: 81  . Highest education level: High school graduate  Occupational History  . Occupation: Retired - poured concrete  Tobacco Use  . Smoking status: Current Every Day Smoker    Packs/day: 1.00    Years: 59.00    Pack years: 59.00    Types: Cigarettes  . Smokeless tobacco: Never Used  . Tobacco comment: Started Chantix 10/26/16  Vaping Use  . Vaping Use: Never used  Substance and Sexual Activity  . Alcohol use: No    Comment: none since approx 2014- heavy before  . Drug use: No  . Sexual activity: Not on file  Other Topics Concern  . Not on file  Social History Narrative   Lives with wife and 2 of his children in a one story home.  Has 3 children.  Retired Counsellor.  Education: high school.   Right Handed   Social Determinants of Health   Financial Resource Strain: Not on file  Food Insecurity: Not on file  Transportation Needs:  Not on file  Physical Activity: Not on file  Stress: Not on file  Social Connections: Not on file  Intimate Partner Violence: Not on file    Allergies  Allergen Reactions  . Tramadol     Other reaction(s): Confusion (intolerance)    Current Outpatient Medications  Medication Sig Dispense Refill  . amitriptyline (ELAVIL) 25 MG tablet Take 25 mg by mouth at bedtime.    Marland Kitchen aspirin EC 81 MG tablet Take 1 tablet (81 mg total) by mouth daily. Swallow whole. 150 tablet 2  . AZOPT 1 % ophthalmic suspension Place 1 drop into both eyes 3 (three) times daily.    . bimatoprost (LUMIGAN) 0.01 % SOLN Place 1 drop into both eyes at bedtime. 30 mL 3  . brimonidine (ALPHAGAN) 0.2 % ophthalmic solution Place 1 drop into both eyes in the morning and at bedtime.    Marland Kitchen esomeprazole (NEXIUM) 40 MG capsule Take 1 capsule (40 mg total) by mouth daily. (Patient taking differently: Take 40 mg by mouth daily as needed (acid reflux/indigestion.).) 30 capsule 2  . Ferrous Sulfate ([employer] SLOW RELEASE IRON) 143 (45 Fe) MG TBCR Take 45 mg by mouth daily.    Marland Kitchen losartan (COZAAR) 100 MG tablet Take 100 mg by mouth daily.  4  . meclizine (ANTIVERT) 25 MG tablet Take 25 mg by mouth daily as needed for dizziness.    . Multiple Vitamin (MULTIVITAMIN WITH MINERALS) TABS tablet Take 1 tablet by mouth daily.    Marland Kitchen MYRBETRIQ 25 MG TB24 tablet Take 25 mg by mouth daily.    . Omega-3 Fatty Acids (FISH OIL) 1000 MG CPDR Take 1,000 mg by mouth daily.    Marland Kitchen oxybutynin (DITROPAN) 5 MG tablet Take 5 mg by mouth every morning.    Marland Kitchen oxyCODONE-acetaminophen (PERCOCET) 10-325 MG tablet Take 1 tablet by mouth every 8 (eight) hours as needed for up to 7 days for pain. 21 tablet 0  . pravastatin (PRAVACHOL) 80 MG tablet Take 80 mg by mouth daily.   2  . tamsulosin (FLOMAX) 0.4 MG CAPS capsule Take 0.4 mg by mouth at bedtime.      No current facility-administered medications for this visit.    REVIEW OF SYSTEMS:  [X]  denotes positive finding,  [ ]  denotes negative finding Cardiac  Comments:  Chest pain or chest pressure:    Shortness of breath upon exertion:    Short of breath when lying flat:  Irregular heart rhythm:        Vascular    Pain in calf, thigh, or hip brought on by ambulation:    Pain in feet at night that wakes you up from your sleep:     Blood clot in your veins:    Leg swelling:         Pulmonary    Oxygen at home:    Productive cough:     Wheezing:         Neurologic    Sudden weakness in arms or legs:     Sudden numbness in arms or legs:     Sudden onset of difficulty speaking or slurred speech:    Temporary loss of vision in one eye:     Problems with dizziness:         Gastrointestinal    Blood in stool:     Vomited blood:         Genitourinary    Burning when urinating:     Blood in urine:        Psychiatric    Major depression:         Hematologic    Bleeding problems:    Problems with blood clotting too easily:        Skin    Rashes or ulcers:        Constitutional    Fever or chills:     PHYSICAL EXAM:   Vitals:   02/18/21 0853  BP: 140/78  Pulse: 64  Resp: 20  Temp: 97.7 F (36.5 C)  SpO2: 96%  Weight: 165 lb (74.8 kg)  Height: 6\' 6"  (1.981 m)   Constitutional: Elderly, thin in no distress. Appears well nourished.  Neurologic: Normal gait and station. CN intact. No weakness. No sensory loss. Psychiatric: Mood and affect symmetric and appropriate. Eyes: No icterus. No conjunctival pallor. Ears, nose, throat: mucous membranes moist. Midline trachea. No carotid bruit. Cardiac: regular rate and rhythm.  Respiratory: unlabored. Abdominal: soft, non-tender, non-distended. No palpable pulsatile abdominal mass. Peripheral vascular:  Femoral pulse: L 2+ / R 2+  Popliteal pulse: L 1+ / R 1+  Dorsalis pedis pulse: L absent / R absent  Posterior tibial pulse: L absent / R absent Extremity: No edema. No cyanosis. No pallor.  Skin: No gangrene. No ulceration.   Lymphatic: No Stemmer's sign. No palpable lymphadenopathy.   DATA REVIEW:    Most recent CBC CBC Latest Ref Rng & Units 12/18/2020 01/14/2019 08/12/2018  WBC 4.0 - 10.5 K/uL - 9.2 13.6(H)  Hemoglobin 13.0 - 17.0 g/dL 11.6(L) 10.4(L) 10.6(L)  Hematocrit 39.0 - 52.0 % 34.0(L) 34.9(L) 35.4(L)  Platelets 150 - 400 K/uL - 331 374     Most recent CMP CMP Latest Ref Rng & Units 12/18/2020 04/01/2020 01/14/2019  Glucose 70 - 99 mg/dL 97 103(H) 114(H)  BUN 8 - 23 mg/dL 31(H) 21 19  Creatinine 0.61 - 1.24 mg/dL 1.50(H) 1.50(H) 1.30(H)  Sodium 135 - 145 mmol/L 142 139 138  Potassium 3.5 - 5.1 mmol/L 4.7 4.2 3.7  Chloride 98 - 111 mmol/L 109 104 103  CO2 22 - 32 mmol/L - 24 21(L)  Calcium 8.9 - 10.3 mg/dL - 9.5 9.5  Total Protein 6.1 - 8.1 g/dL - - -  Total Bilirubin 0.3 - 1.2 mg/dL - - -  Alkaline Phos 38 - 126 U/L - - -  AST 15 - 41 U/L - - -  ALT 0 - 44 U/L - - -  Renal function CrCl cannot be calculated (Patient's most recent lab result is older than the maximum 21 days allowed.).  Hgb A1c MFr Bld (%)  Date Value  02/24/2015 5.9 (H)    LDL Cholesterol  Date Value Ref Range Status  06/24/2017 55 0 - 99 mg/dL Final    Comment:           Total Cholesterol/HDL:CHD Risk Coronary Heart Disease Risk Table                     Men   Women  1/2 Average Risk   3.4   3.3  Average Risk       5.0   4.4  2 X Average Risk   9.6   7.1  3 X Average Risk  23.4   11.0        Use the calculated Patient Ratio above and the CHD Risk Table to determine the patient's CHD Risk.        ATP III CLASSIFICATION (LDL):  <100     mg/dL   Optimal  100-129  mg/dL   Near or Above                    Optimal  130-159  mg/dL   Borderline  160-189  mg/dL   High  >190     mg/dL   Very High      Vascular Imaging: ABI 12/03/20     Yevonne Aline. Stanford Breed, MD [facility] Phone Number: [phone] 02/18/2021 10:00 AM

## 2021-02-25 ENCOUNTER — Other Ambulatory Visit (HOSPITAL_COMMUNITY): Payer: Medicare Other

## 2021-02-26 ENCOUNTER — Encounter (HOSPITAL_COMMUNITY): Payer: Self-pay | Admitting: Vascular Surgery

## 2021-02-26 ENCOUNTER — Other Ambulatory Visit: Payer: Self-pay | Admitting: Vascular Surgery

## 2021-02-26 ENCOUNTER — Ambulatory Visit (HOSPITAL_COMMUNITY)
Admission: RE | Admit: 2021-02-26 | Discharge: 2021-02-26 | Disposition: A | Discharge status: Home / Self Care | Payer: Medicare Other | Attending: Vascular Surgery | Admitting: Vascular Surgery

## 2021-02-26 ENCOUNTER — Encounter (HOSPITAL_COMMUNITY)
Admission: RE | Disposition: A | Discharge status: Home / Self Care | Payer: Self-pay | Source: Home / Self Care | Attending: Vascular Surgery

## 2021-02-26 DIAGNOSIS — E1136 Type 2 diabetes mellitus with diabetic cataract: Secondary | ICD-10-CM | POA: Diagnosis not present

## 2021-02-26 DIAGNOSIS — Z79899 Other long term (current) drug therapy: Secondary | ICD-10-CM | POA: Insufficient documentation

## 2021-02-26 DIAGNOSIS — Z7982 Long term (current) use of aspirin: Secondary | ICD-10-CM | POA: Diagnosis not present

## 2021-02-26 DIAGNOSIS — Z8669 Personal history of other diseases of the nervous system and sense organs: Secondary | ICD-10-CM | POA: Insufficient documentation

## 2021-02-26 DIAGNOSIS — Z8619 Personal history of other infectious and parasitic diseases: Secondary | ICD-10-CM | POA: Diagnosis not present

## 2021-02-26 DIAGNOSIS — Z885 Allergy status to narcotic agent status: Secondary | ICD-10-CM | POA: Diagnosis not present

## 2021-02-26 DIAGNOSIS — Z8249 Family history of ischemic heart disease and other diseases of the circulatory system: Secondary | ICD-10-CM | POA: Insufficient documentation

## 2021-02-26 DIAGNOSIS — I70221 Atherosclerosis of native arteries of extremities with rest pain, right leg: Secondary | ICD-10-CM | POA: Diagnosis present

## 2021-02-26 DIAGNOSIS — N179 Acute kidney failure, unspecified: Secondary | ICD-10-CM | POA: Insufficient documentation

## 2021-02-26 DIAGNOSIS — Z833 Family history of diabetes mellitus: Secondary | ICD-10-CM | POA: Insufficient documentation

## 2021-02-26 DIAGNOSIS — I10 Essential (primary) hypertension: Secondary | ICD-10-CM | POA: Insufficient documentation

## 2021-02-26 DIAGNOSIS — I70222 Atherosclerosis of native arteries of extremities with rest pain, left leg: Secondary | ICD-10-CM | POA: Diagnosis not present

## 2021-02-26 DIAGNOSIS — Z20822 Contact with and (suspected) exposure to covid-19: Secondary | ICD-10-CM | POA: Diagnosis not present

## 2021-02-26 DIAGNOSIS — E782 Mixed hyperlipidemia: Secondary | ICD-10-CM | POA: Insufficient documentation

## 2021-02-26 DIAGNOSIS — E1151 Type 2 diabetes mellitus with diabetic peripheral angiopathy without gangrene: Secondary | ICD-10-CM | POA: Diagnosis not present

## 2021-02-26 DIAGNOSIS — F17210 Nicotine dependence, cigarettes, uncomplicated: Secondary | ICD-10-CM | POA: Diagnosis not present

## 2021-02-26 HISTORY — PX: ABDOMINAL AORTOGRAM W/LOWER EXTREMITY: CATH118223

## 2021-02-26 HISTORY — PX: PERIPHERAL VASCULAR INTERVENTION: CATH118257

## 2021-02-26 LAB — POCT I-STAT, CHEM 8
BUN: 32 mg/dL — ABNORMAL HIGH (ref 8–23)
Calcium, Ion: 1.28 mmol/L (ref 1.15–1.40)
Chloride: 109 mmol/L (ref 98–111)
Creatinine, Ser: 1.6 mg/dL — ABNORMAL HIGH (ref 0.61–1.24)
Glucose, Bld: 96 mg/dL (ref 70–99)
HCT: 31 % — ABNORMAL LOW (ref 39.0–52.0)
Hemoglobin: 10.5 g/dL — ABNORMAL LOW (ref 13.0–17.0)
Potassium: 4.3 mmol/L (ref 3.5–5.1)
Sodium: 141 mmol/L (ref 135–145)
TCO2: 23 mmol/L (ref 22–32)

## 2021-02-26 LAB — SARS CORONAVIRUS 2 BY RT PCR (HOSPITAL ORDER, PERFORMED IN ~~LOC~~ HOSPITAL LAB): SARS Coronavirus 2: NEGATIVE

## 2021-02-26 SURGERY — ABDOMINAL AORTOGRAM W/LOWER EXTREMITY
Anesthesia: LOCAL

## 2021-02-26 MED ORDER — CLOPIDOGREL BISULFATE 75 MG PO TABS: 75.0000 mg | ORAL_TABLET | Freq: Every day | ORAL | 11 refills | Status: DC

## 2021-02-26 MED ORDER — LIDOCAINE HCL (PF) 1 % IJ SOLN
INTRAMUSCULAR | Status: DC | PRN
  Administered 2021-02-26: 18 mL via INTRADERMAL

## 2021-02-26 MED ORDER — SODIUM CHLORIDE 0.9% FLUSH: 3.0000 mL | INTRAVENOUS | Status: DC | PRN

## 2021-02-26 MED ORDER — LABETALOL HCL 5 MG/ML IV SOLN: 10.0000 mg | INTRAVENOUS | Status: DC | PRN

## 2021-02-26 MED ORDER — HYDRALAZINE HCL 20 MG/ML IJ SOLN: 5.0000 mg | INTRAMUSCULAR | Status: DC | PRN

## 2021-02-26 MED ORDER — HEPARIN SODIUM (PORCINE) 1000 UNIT/ML IJ SOLN
INTRAMUSCULAR | Status: AC
  Filled 2021-02-26: qty 1

## 2021-02-26 MED ORDER — ACETAMINOPHEN 325 MG PO TABS: 650.0000 mg | ORAL_TABLET | ORAL | Status: DC | PRN

## 2021-02-26 MED ORDER — SODIUM CHLORIDE 0.9 % IV SOLN: 250.0000 mL | INTRAVENOUS | Status: DC | PRN

## 2021-02-26 MED ORDER — HEPARIN (PORCINE) IN NACL 1000-0.9 UT/500ML-% IV SOLN
INTRAVENOUS | Status: AC
  Filled 2021-02-26: qty 1000

## 2021-02-26 MED ORDER — HEPARIN (PORCINE) IN NACL 1000-0.9 UT/500ML-% IV SOLN
INTRAVENOUS | Status: DC | PRN
  Administered 2021-02-26 (×2): 500 mL

## 2021-02-26 MED ORDER — LIDOCAINE HCL (PF) 1 % IJ SOLN
INTRAMUSCULAR | Status: AC
  Filled 2021-02-26: qty 30

## 2021-02-26 MED ORDER — CLOPIDOGREL BISULFATE 75 MG PO TABS: 300.0000 mg | ORAL_TABLET | Freq: Once | ORAL | Status: DC

## 2021-02-26 MED ORDER — ONDANSETRON HCL 4 MG/2ML IJ SOLN: 4.0000 mg | Freq: Four times a day (QID) | INTRAMUSCULAR | Status: DC | PRN

## 2021-02-26 MED ORDER — SODIUM CHLORIDE 0.9% FLUSH: 3.0000 mL | Freq: Two times a day (BID) | INTRAVENOUS | Status: DC

## 2021-02-26 MED ORDER — SODIUM CHLORIDE 0.9 % IV SOLN: INTRAVENOUS | Status: DC

## 2021-02-26 MED ORDER — CLOPIDOGREL BISULFATE 75 MG PO TABS: 75.0000 mg | ORAL_TABLET | Freq: Every day | ORAL | Status: DC

## 2021-02-26 MED ORDER — CLOPIDOGREL BISULFATE 300 MG PO TABS
ORAL_TABLET | ORAL | Status: AC
  Filled 2021-02-26: qty 1

## 2021-02-26 MED ORDER — CLOPIDOGREL BISULFATE 300 MG PO TABS
ORAL_TABLET | ORAL | Status: DC | PRN
  Administered 2021-02-26: 300 mg via ORAL

## 2021-02-26 MED ORDER — IODIXANOL 320 MG/ML IV SOLN
INTRAVENOUS | Status: DC | PRN
  Administered 2021-02-26: 90 mL via INTRA_ARTERIAL

## 2021-02-26 MED ORDER — SODIUM CHLORIDE 0.9 % WEIGHT BASED INFUSION: 1.0000 mL/kg/h | INTRAVENOUS | Status: DC

## 2021-02-26 MED ORDER — HEPARIN SODIUM (PORCINE) 1000 UNIT/ML IJ SOLN
INTRAMUSCULAR | Status: DC | PRN
  Administered 2021-02-26: 8000 [IU] via INTRAVENOUS

## 2021-02-26 MED FILL — CLOPIDOGREL 75 MG TABLET: 75 | 30 days supply | Qty: 30 | Fill #0

## 2021-02-26 SURGICAL SUPPLY — 16 items
BALLN MUSTANG 6X200X135 (BALLOONS) ×3
BALLOON MUSTANG 6X200X135 (BALLOONS) IMPLANT
CATH OMNI FLUSH 5F 65CM (CATHETERS) ×1 IMPLANT
CATH QUICKCROSS .035X135CM (MICROCATHETER) ×1 IMPLANT
CLOSURE PERCLOSE PROSTYLE (VASCULAR PRODUCTS) ×1 IMPLANT
GLIDEWIRE ADV .035X260CM (WIRE) ×1 IMPLANT
KIT ENCORE 26 ADVANTAGE (KITS) ×1 IMPLANT
KIT MICROPUNCTURE NIT STIFF (SHEATH) ×1 IMPLANT
KIT PV (KITS) ×3 IMPLANT
SHEATH PINNACLE 5F 10CM (SHEATH) ×1 IMPLANT
SHEATH PINNACLE ST 6F 45CM (SHEATH) ×2 IMPLANT
SHEATH PROBE COVER 6X72 (BAG) ×1 IMPLANT
STENT ELUVIA 7X100X130 (Permanent Stent) ×1 IMPLANT
STENT ELUVIA 7X120X130 (Permanent Stent) ×1 IMPLANT
SYR MEDRAD MARK V 150ML (SYRINGE) ×2 IMPLANT
TRANSDUCER W/STOPCOCK (MISCELLANEOUS) ×3 IMPLANT

## 2021-02-26 NOTE — Discharge Instructions (Signed)
Angiogram, Care After This sheet gives you information about how to care for yourself after your procedure. Your health care provider may also give you more specific instructions. If you have problems or questions, contact your health care provider. What can I expect after the procedure? After the procedure, it is common to have:  Bruising and tenderness at the catheter insertion area.  A collection of blood (hematoma) at the insertion area. This may feel like a small lump under the skin at the insertion site. Follow these instructions at home: Insertion site care  Follow instructions from your health care provider about how to take care of your insertion site. Make sure you: ? Wash your hands with soap and water before and after you change your bandage (dressing). If soap and water are not available, use hand sanitizer. ? Change your dressing as told by your health care provider.  Do not take baths, swim, or use a hot tub until your health care provider approves.  You may shower 24-48 hours after the procedure, or as told by your health care provider. To clean the insertion site: ? Gently wash the area with plain soap and water. ? Pat the area dry with a clean towel. ? Do not rub the site. This may cause bleeding.  Check your insertion site every day for signs of infection. Check for: ? Redness, swelling, or pain. ? Fluid or blood. ? Warmth. ? Pus or a bad smell.  Do not apply powder or lotion to the site. Keep the site clean and dry.   Activity  Do not drive for 24 hours if you were given a sedative during your procedure.  Rest as told by your health care provider, usually for 1-2 days.  Do not lift anything that is heavier than 10 lb (4.5 kg), or the limit that you are told, until your health care provider says that it is safe.  If the insertion site was in your leg, try to avoid stairs for a few days.  Return to your normal activities as told by your health care provider,  usually in about a week. Ask your health care provider what activities are safe for you. General instructions  If your insertion site starts bleeding, lie flat and put pressure on the site. If the bleeding does not stop, get help right away. This is a medical emergency.  Take over-the-counter and prescription medicines only as told by your health care provider.  Drink enough fluid to keep your urine pale yellow. This helps flush the contrast dye from your body.  Keep all follow-up visits as told by your health care provider. This is important.   Contact a health care provider if:  You have a fever or chills.  You have redness, swelling, or pain around your insertion site.  You have fluid or blood coming from your insertion site.  Your insertion site feels warm to the touch.  You have pus or a bad smell coming from your insertion site.  You have more bruising around the insertion site. Get help right away if you have:  A problem with the insertion area, such as: ? The area swells fast or bleeds even after you apply pressure. ? The area becomes pale, cool, tingly, or numb.  Chest pain.  Trouble breathing.  A rash.  Any symptoms of a stroke. "BE FAST" is an easy way to remember the main warning signs of a stroke: ? B - Balance. Signs are dizziness, sudden trouble walking,   or loss of balance. ? E - Eyes. Signs are trouble seeing or a sudden change in vision. ? F - Face. Signs are sudden weakness or loss of feeling of the face, or the face or eyelid drooping on one side. ? A - Arms. Signs are weakness or loss of feeling in an arm. This happens suddenly and usually on one side of the body. ? S - Speech. Signs are sudden trouble speaking, slurred speech, or trouble understanding what people say. ? T - Time. Time to call emergency services. Write down what time symptoms started.  You have other signs of a stroke, such as: ? A sudden, severe headache with no known cause. ? Nausea  or vomiting. ? Seizure. These symptoms may represent a serious problem that is an emergency. Do not wait to see if the symptoms will go away. Get medical help right away. Call your local emergency services (911 in the [place] Do not drive yourself to the hospital. Summary  It is common to have bruising and tenderness at the catheter insertion area.  Do not take baths, swim, or use a hot tub until your health care provider approves. You may shower 24-48 hours after the procedure or as told.  It is important to rest and drink plenty of fluids.  If the insertion site bleeds, lie flat and put pressure on the site. If the bleeding continues, get help right away. This is a medical emergency. This information is not intended to replace advice given to you by your health care provider. Make sure you discuss any questions you have with your health care provider. Document Revised: 10/11/2019 Document Reviewed: 10/11/2019 Elsevier Patient Education  2021 Elsevier Inc.  

## 2021-02-26 NOTE — Interval H&P Note (Signed)
History and Physical Interval Note:  02/26/2021 8:28 AM  Russell [name]  has presented today for surgery, with the diagnosis of pad.  The various methods of treatment have been discussed with the patient and family. After consideration of risks, benefits and other options for treatment, the patient has consented to  Procedure(s): ABDOMINAL AORTOGRAM W/LOWER EXTREMITY (N/A) as a surgical intervention.  The patient's history has been reviewed, patient examined, no change in status, stable for surgery.  I have reviewed the patient's chart and labs.  Questions were answered to the patient's satisfaction.     Cherre Robins

## 2021-02-26 NOTE — Op Note (Signed)
DATE OF SERVICE: 02/26/2021  PATIENT:  Russell [name]  80 y.o. 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  TOURNIQUET:  None  PATIENT DISPOSITION:  PACU - hemodynamically stable.   Delay start of Pharmacological VTE agent (>24hrs) due to surgical blood loss or risk of bleeding: no  INDICATION FOR PROCEDURE: [name] is a 80 y.o. male with ischemic rest pain of the left lower extremity. I performed a diagnostic angiogram at that time he had severely disadvantaged infrageniculate circulation.  He had TP trunk stenosis and peroneal only runoff to the foot.  We felt it best to avoid a potentially dangerous intervention and have a frank discussion with the patient.  He returns the office 02/18/2021.  I had a long discussion with him about the nature of his disease and my concern about a tibial intervention.  He wanted me to try a low risk intervention before proceeding with the tibial intervention.  I felt this was prudent.  I offer the patient left lower extremity angiography with likely femoral stenting.  We specifically discussed access site complication. The patient understood and wished to proceed.  OPERATIVE FINDINGS:  Essentially unchanged angiogram from prior Aortic pressure 180/80 mmHg Left external iliac artery pressure 180/80 mmHg Proximal left SFA pressure 170/80 mmHg Distal  left popliteal pressure 145/70 mmHg Diffuse disease noted in the left superficial femoral artery.  Greatest area of stenosis roughly 70%. No residual stenosis in left superficial femoral artery on completion.  DESCRIPTION OF PROCEDURE: After identification of the patient in the pre-operative holding area, the patient was transferred to the operating room. The patient was positioned supine on the operating room table. Anesthesia was induced. The groins was prepped and draped in standard fashion. A surgical pause was performed confirming correct patient, procedure, and operative location.  The right groin was anesthetized with subcutaneous injection of 1% lidocaine. Using ultrasound guidance, the right common femoral artery was accessed with micropuncture technique. Fluoroscopy was used to confirm cannulation over the femoral head. Sheathogram was not performed. The 42F sheath was upsized to 29F.   An 035 glidewire advantage was advanced into the distal aorta. Over the wire an omni flush catheter was advanced to the level of L2. Aortogram was performed - see above for details.   The left common iliac artery was selected with the 035 glidewire advantage. The wire was advanced into the common femoral artery. Over the wire the omni flush catheter was advanced into the external iliac artery. Selective angiography was performed - see above for details.   The decision was made to intervene. The patient was heparinized with 9000 units of heparin. The sheath was exchanged for a 33F x 45cm sheath. Selective angiography of the left lower extremity was performed prior to intervention. The lesions were treated with stenting (7x120 + 7x128mm Eluvia)  Completion angiography revealed:  Resolution of the mid / distal left SFA stenosis  A perclose device was used to close the arteriotomy. Hemostasis was excellent upon completion.  Upon completion of the case instrument and sharps counts were confirmed correct. The  patient was transferred to the PACU in good condition. I was present for all portions of the procedure.  PLAN: Needs ASA 81mg  PO QD indefinitely. Clopidogrel 75mg  PO QD x 12 months. High intensity statin therapy indefinitely. Follow up with me in 1-2 weeks. If he has no symptomatic relief from this, we will need to consider TP trunk / peroneal intervention which will expose him to small but real risk of limb loss if flow limiting dissection occurs.  Yevonne Aline. Stanford Breed, MD [facility] Phone Number: [phone] 02/26/2021 11:14 AM

## 2021-03-04 ENCOUNTER — Other Ambulatory Visit: Payer: Self-pay

## 2021-03-04 ENCOUNTER — Ambulatory Visit (INDEPENDENT_AMBULATORY_CARE_PROVIDER_SITE_OTHER): Payer: Medicare Other | Admitting: Podiatry

## 2021-03-04 ENCOUNTER — Other Ambulatory Visit: Payer: Self-pay | Admitting: Podiatry

## 2021-03-04 DIAGNOSIS — G5762 Lesion of plantar nerve, left lower limb: Secondary | ICD-10-CM | POA: Diagnosis not present

## 2021-03-04 DIAGNOSIS — G5782 Other specified mononeuropathies of left lower limb: Secondary | ICD-10-CM

## 2021-03-04 MED ORDER — OXYCODONE-ACETAMINOPHEN 10-325 MG PO TABS: 1.0000 | ORAL_TABLET | Freq: Three times a day (TID) | ORAL | 0 refills | Status: AC | PRN

## 2021-03-04 NOTE — Progress Notes (Signed)
He presents today for follow-up of his neuroma third interdigital space.  He was just revascularized to his left leg last week.  He states that he feels much better along the leg as he had referred that the neuroma was causing his leg to hurt.  At this point his leg is no longer hurting.  Objective: Vital signs are stable he is alert and oriented x3 pulses are still nonpalpable right that he does have more venous distention now that he did previously.  And his foot is warmer to the touch.  He also had bleeding after we had injected him which he usually does not have.  He still has pain on palpation to the third interspace of the left foot.  Assessment neuroma third interspace left.  Plan: I injected dehydrated alcohol third interspace left foot.  Provided him with 20 Percocets I will follow-up with him in 4 weeks.  Should he have questions or concerns he will notify us immediately.

## 2021-03-13 ENCOUNTER — Other Ambulatory Visit: Payer: Self-pay | Admitting: *Deleted

## 2021-03-13 DIAGNOSIS — I739 Peripheral vascular disease, unspecified: Secondary | ICD-10-CM

## 2021-03-13 DIAGNOSIS — I70222 Atherosclerosis of native arteries of extremities with rest pain, left leg: Secondary | ICD-10-CM

## 2021-03-18 ENCOUNTER — Encounter: Payer: Self-pay | Admitting: Physician Assistant

## 2021-03-18 ENCOUNTER — Ambulatory Visit (HOSPITAL_COMMUNITY)
Admission: RE | Admit: 2021-03-18 | Discharge: 2021-03-18 | Disposition: A | Discharge status: Home / Self Care | Payer: Medicare Other | Source: Ambulatory Visit | Attending: Vascular Surgery | Admitting: Vascular Surgery

## 2021-03-18 ENCOUNTER — Ambulatory Visit (INDEPENDENT_AMBULATORY_CARE_PROVIDER_SITE_OTHER)
Admission: RE | Admit: 2021-03-18 | Discharge: 2021-03-18 | Disposition: A | Discharge status: Home / Self Care | Payer: Medicare Other | Source: Ambulatory Visit | Attending: Vascular Surgery | Admitting: Vascular Surgery

## 2021-03-18 ENCOUNTER — Other Ambulatory Visit: Payer: Self-pay

## 2021-03-18 ENCOUNTER — Ambulatory Visit (INDEPENDENT_AMBULATORY_CARE_PROVIDER_SITE_OTHER): Payer: Medicare Other | Admitting: Physician Assistant

## 2021-03-18 VITALS — BP 135/74 | HR 74 | Temp 97.4°F | Resp 20 | Ht 74.0 in | Wt 167.5 lb

## 2021-03-18 DIAGNOSIS — I70222 Atherosclerosis of native arteries of extremities with rest pain, left leg: Secondary | ICD-10-CM

## 2021-03-18 DIAGNOSIS — I739 Peripheral vascular disease, unspecified: Secondary | ICD-10-CM

## 2021-03-18 NOTE — Progress Notes (Signed)
Office Note     CC:  follow up Requesting Provider:  Marcie Mowers, FNP  HPI: Russell [name] is a 80 y.o. (10-29-1941) male who presents for follow up of peripheral artery disease. He just recently had Aortogram with Left lower extremity angiogram, left SFA drug eluting stent by Dr. Stanford Breed on 02/26/21 for rest pain.  States his pain has resolved since procedure. He still is having some numbness on lateral distal leg and also in his left foot. This has been present for some time now. 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 He says sometimes these help and sometimes they don't. He has been compliant with his Aspirin, statin and Plavix. He continues to smoke 1 ppd  The pt is on a statin for cholesterol management.  The pt is on a daily aspirin.   Other AC:  Plavix The pt is on ARB for hypertension.   The pt is not diabetic.  Tobacco hx:  Current smoker, 1 ppd  Past Medical History:  Diagnosis Date  . AKI (acute kidney injury) ([place]) 06/23/2017  . Arthritis    SHOUDLERS, LUMBAR  . BPH with obstruction/lower urinary tract symptoms   . BPH with urinary obstruction 01/15/2016  . COPD (chronic obstructive pulmonary disease) ([place])   . Coronary atherosclerosis    denied knowledge of this 10/27/16  . Depression   . Diverticulosis of colon   . Dyspnea   . ED (erectile dysfunction)   . Full dentures   . GERD (gastroesophageal reflux disease)    not current  . Glaucoma   . History of gastritis   . History of scabies    2008  . 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 History of syncope    03-22-2014  DX  VAGAL RESPONSE  . Hyperlipidemia, mixed   . Hypertension   . Mitral regurgitation   . Peripheral arterial disease ([place])    one vessel runoff bilaterally via peroneal arteries  . Prostate cancer ([place])   . Seizures ([place]) 03/2014   ? or syncope- patient refused to be admitted for further studies- "felt  better"  . Type 2 diabetes, diet controlled ([place])    patient and his wife said no- have not been told 01/21/17  . Wears glasses     Past Surgical History:  Procedure Laterality Date  . ABDOMINAL AORTOGRAM W/LOWER EXTREMITY Left 12/18/2020   Procedure: ABDOMINAL AORTOGRAM W/LOWER EXTREMITY;  Surgeon: Cherre Robins, MD;  Location: [facility];  Service: Cardiovascular;  Laterality: Left;  . ABDOMINAL AORTOGRAM W/LOWER EXTREMITY N/A 02/26/2021   Procedure: ABDOMINAL AORTOGRAM W/LOWER EXTREMITY;  Surgeon: Cherre Robins, MD;  Location: [facility];  Service: Cardiovascular;  Laterality: N/A;  . ANTERIOR CERVICAL DECOMP/DISCECTOMY FUSION  10-16-2009   C4 -- C6  . ANTERIOR CERVICAL DECOMP/DISCECTOMY FUSION N/A 01/27/2017   Procedure: ANTERIOR CERVICAL DECOMPRESSION FUSION CERVICAL 3-4 WITH INSTRUMENTATION AND ALLOGRAFT;  Surgeon: Phylliss Bob, MD;  Location: [name];  Service: Orthopedics;  Laterality: N/A;  ANTERIOR CERVICAL DECOMPRESSION FUSION CERVICAL 3-4 WITH INSTRUMENTATION AND ALLOGRAFT  . CAPSULOTOMY Right 01/26/2013   Procedure: MINOR CAPSULOTOMY;  Surgeon: Myrtha Mantis., MD;  Location: [facility];  Service: Ophthalmology;  Laterality: Right;  . CARPAL TUNNEL RELEASE Left 01/02/2019   Procedure: LEFT CARPAL TUNNEL RELEASE;  Surgeon: Leanora Cover, MD;  Location: [place];  Service:  Orthopedics;  Laterality: Left;  . CARPECTOMY Right 03/13/2014   Procedure: RIGHT  PROXIMAL ROW CARPECTOMY;  Surgeon: Tennis Must, MD;  Location: [place] [facility];  Service: Orthopedics;  Laterality: Right;  . CARPECTOMY Left 10/22/2015   Procedure: LEFT WRIST PROXIMAL ROW CARPECTOMY ;  Surgeon: Leanora Cover, MD;  Location: [place];  Service: Orthopedics;  Laterality: Left;  . CARPOMETACARPAL ([place]) FUSION OF THUMB Right 03/13/2014   Procedure: RIGHT FUSION OF THUMB CARPOMETACARPAL [facility]) JOINT;  Surgeon: Tennis Must, MD;  Location: [place];  Service: Orthopedics;  Laterality: Right;  . CARPOMETACARPEL SUSPENSION PLASTY Left 04/04/2020   Procedure: LEFT THUMB TRAPECIECTOMY AND SUSPENSIONPLASTY;  Surgeon: Leanora Cover, MD;  Location: [place];  Service: Orthopedics;  Laterality: Left;  . CATARACT EXTRACTION W/ INTRAOCULAR LENS  IMPLANT, BILATERAL  2009  . COLONOSCOPY  08-31-2007  . COLONOSCOPY    . CRYOABLATION N/A 01/14/2015   Procedure: CRYO ABLATION PROSTATE;  Surgeon: Ailene Rud, MD;  Location[facility]  Service: Urology;  Laterality: N/A;  . ESOPHAGOGASTRODUODENOSCOPY  last one 09-11-2008  . EXCISION MASS LEFT FACE , SUBORBITAL AREA, PLASTER RECONSTRUCTION  02-09-2011  . EXCISIONAL DEBRIDEMENT AND REPAIR RIGHT QUADRICEP TENDON  07-05-2000  . FOOT SURGERY Left   . I & D EXTREMITY Right 05/24/2013   Procedure: RIGHT INDEX WOUND DEBRIDEMENT AND CLOSURE;  Surgeon: Jolyn Nap, MD;  Location: [place];  Service: Orthopedics;  Laterality: Right;  . INSERTION OF SUPRAPUBIC CATHETER N/A 01/14/2015   Procedure: SUPRAPUBIC TUBE PLACEMENT;  Surgeon: Ailene Rud, MD;  Location: [facility];  Service: Urology;  Laterality: N/A;  . KNEE ARTHROSCOPY Right 2008  . LARYNGOSCOPY AND ESOPHAGOSCOPY  06-13-2010   MARSUPIALIZATION LEFT LARGE VALLECULAR CYST  . MASS EXCISION Left 10/22/2015   Procedure: EXCISION MASS OF LEFT INDEX FINGER;  Surgeon: Leanora Cover, MD;  Location: [place];  Service: Orthopedics;  Laterality: Left;  . ORCHIECTOMY Left 02/24/2015   Procedure: SCROTAL EXPLORATION WITH LEFT ORCHIECTOMY;  Surgeon: Kathie Rhodes, MD;  Location: [place];  Service: Urology;  Laterality: Left;  . PERIPHERAL VASCULAR INTERVENTION Left 02/26/2021   Procedure: PERIPHERAL VASCULAR INTERVENTION;  Surgeon: Cherre Robins, MD;  Location: [place] [facility];  Service: Cardiovascular;  Laterality: Left;  Superficial femoral  . POSTERIOR CERVICAL  FUSION/FORAMINOTOMY N/A 01/28/2017   Procedure: POSTERIOR SPINAL FUSION CERVICAL 3-4, CERVICAL 4-5, CERVICAL 5-6, CERVICAL 6-7 WITH INSTRUMENATION AND ALLOGRAFT;  Surgeon: Phylliss Bob, MD;  Location: [place];  Service: Orthopedics;  Laterality: N/A;  POSTERIOR SPINAL FUSION CERVICAL 3-4, CERVICAL 4-5, CERVICAL 5-6, CERVICAL 6-7 WITH INSTRUMENATION AND ALLOGRAFT  . SHOULDER ARTHROSCOPY/ DEBRIDEMENT LABRAL TEAR/  BICEPS TENOTOMY Left 09-24-2011  . TONSILLECTOMY  as child  . TRANSTHORACIC ECHOCARDIOGRAM  02-21-2010   normal LVF/  ef 55-60%/ mild to moderate MR/  moderate LAE/  mild TR  . YAG LASER APPLICATION Right 7/0/2637   Procedure: YAG LASER APPLICATION;  Surgeon: Myrtha Mantis., MD;  Location: [place];  Service: Ophthalmology;  Laterality: Right;  . YAG LASER CAPSULOTOMY, LEFT EYE  01-08-2011    Social History   Socioeconomic History  . Marital status: Married    Spouse name: Not on file  . Number of children: 3  . Years of education: 35  . Highest education level: High school graduate  Occupational History  . Occupation: Retired - poured concrete  Tobacco Use  . Smoking  status: Current Every Day Smoker    Packs/day: 1.00    Years: 59.00    Pack years: 59.00    Types: Cigarettes  . Smokeless tobacco: Never Used  . Tobacco comment: Started Chantix 10/26/16  Vaping Use  . Vaping Use: Never used  Substance and Sexual Activity  . Alcohol use: No    Comment: none since approx 2014- heavy before  . Drug use: No  . Sexual activity: Not on file  Other Topics Concern  . Not on file  Social History Narrative   Lives with wife and 2 of his children in a one story home.  Has 3 children.  Retired Counsellor.  Education: high school.   Right Handed   Social Determinants of Health   Financial Resource Strain: Not on file  Food Insecurity: Not on file  Transportation Needs: Not on file  Physical Activity: Not on file  Stress: Not on file  Social Connections: Not on file   Intimate Partner Violence: Not on file    Family History  Problem Relation Age of Onset  . Unexplained death Mother        died at a young age, no known cause  . Heart attack Father        age unknown  . Diabetes Other   . Cancer Other     Current Outpatient Medications  Medication Sig Dispense Refill  . amitriptyline (ELAVIL) 25 MG tablet Take 25 mg by mouth at bedtime.    Marland Kitchen aspirin EC 81 MG tablet Take 1 tablet (81 mg total) by mouth daily. Swallow whole. 150 tablet 2  . AZOPT 1 % ophthalmic suspension Place 1 drop into both eyes 3 (three) times daily.    . bimatoprost (LUMIGAN) 0.01 % SOLN Place 1 drop into both eyes at bedtime. 30 mL 3  . brimonidine (ALPHAGAN) 0.2 % ophthalmic solution Place 1 drop into both eyes in the morning and at bedtime.    . clopidogrel (PLAVIX) 75 MG tablet Take 1 tablet (75 mg total) by mouth daily. 30 tablet 11  . esomeprazole (NEXIUM) 40 MG capsule Take 1 capsule (40 mg total) by mouth daily. (Patient taking differently: Take 40 mg by mouth daily as needed (acid reflux/indigestion.).) 30 capsule 2  . Ferrous Sulfate ([employer] SLOW RELEASE IRON) 143 (45 Fe) MG TBCR Take 45 mg by mouth daily.    Marland Kitchen gabapentin (NEURONTIN) 300 MG capsule Take 300 mg by mouth daily.    Marland Kitchen losartan (COZAAR) 100 MG tablet Take 100 mg by mouth daily.  4  . meclizine (ANTIVERT) 25 MG tablet Take 25 mg by mouth daily as needed for dizziness.    . Multiple Vitamin (MULTIVITAMIN WITH MINERALS) TABS tablet Take 1 tablet by mouth daily.    Marland Kitchen MYRBETRIQ 25 MG TB24 tablet Take 25 mg by mouth daily.    . Omega-3 Fatty Acids (FISH OIL) 1000 MG CPDR Take 1,000 mg by mouth daily.    Marland Kitchen oxybutynin (DITROPAN) 5 MG tablet Take 5 mg by mouth every morning.    . pravastatin (PRAVACHOL) 80 MG tablet Take 80 mg by mouth daily.   2  . tamsulosin (FLOMAX) 0.4 MG CAPS capsule Take 0.4 mg by mouth at bedtime.      No current facility-administered medications for this visit.    Allergies  Allergen  Reactions  . Tramadol     Confusion (intolerance)     REVIEW OF SYSTEMS:  [X]  denotes positive finding, [ ]  denotes negative  finding Cardiac  Comments:  Chest pain or chest pressure:    Shortness of breath upon exertion:    Short of breath when lying flat:    Irregular heart rhythm:        Vascular    Pain in calf, thigh, or hip brought on by ambulation:    Pain in feet at night that wakes you up from your sleep:     Blood clot in your veins:    Leg swelling:         Pulmonary    Oxygen at home:    Productive cough:     Wheezing:         Neurologic    Sudden weakness in arms or legs:     Sudden numbness in arms or legs:     Sudden onset of difficulty speaking or slurred speech:    Temporary loss of vision in one eye:     Problems with dizziness:         Gastrointestinal    Blood in stool:     Vomited blood:         Genitourinary    Burning when urinating:     Blood in urine:        Psychiatric    Major depression:         Hematologic    Bleeding problems:    Problems with blood clotting too easily:        Skin    Rashes or ulcers:        Constitutional    Fever or chills:      PHYSICAL EXAMINATION:  Vitals:   03/18/21 0904  BP: 135/74  Pulse: 74  Resp: 20  Temp: (!) 97.4 F (36.3 C)  TempSrc: Temporal  SpO2: 100%  Weight: 167 lb 8 oz (76 kg)  Height: 6\' 2"  (1.88 m)    General:  WDWN in NAD; vital signs documented above Gait: Normal HENT: WNL, normocephalic Pulmonary: normal non-labored breathing , without wheezing Cardiac: regular HR, without  Murmurs without carotid bruit Abdomen: soft, NT, no masses Vascular Exam/Pulses: 2+ radial pulses, 2+ femoral pulses bilaterally, no palpable distal pulses Extremities: without ischemic changes, without Gangrene , without cellulitis; without open wounds;  Musculoskeletal: no muscle wasting or atrophy  Neurologic: A&O X 3;  No focal weakness or paresthesias are detected Psychiatric:  The pt has  Normal affect.   Non-Invasive Vascular Imaging:   Independently reviewed left lower extremity arterial duplex which shows patent left SFA stent and with triphasic and biphasic flow to level of popliteal artery and monophasic tibial disease in the AT and PT and triphasic Peroneal  +-------+-----------+-----------+------------+------------+  ABI/TBIToday's ABIToday's TBIPrevious ABIPrevious TBI  +-------+-----------+-----------+------------+------------+  Right 1.05    0.79    0.90    0.80      +-------+-----------+-----------+------------+------------+  Left  1.03    0.33    0.90    0.36      +-------+-----------+-----------+------------+------------+   ASSESSMENT/PLAN:: 80 y.o. male here for follow up for follow up of peripheral artery disease. He just recently had Aortogram with Left lower extremity angiogram, left SFA drug eluting stent by Dr. Stanford Breed on 02/26/21 for rest pain. Symptoms of rest pain resolved post intervention. He will continue to see podiatrist as needed for Left foot Neuroma management - Non invasive studies show patent left SFA stent with adequate inflow.  Known severe tibial disease - ABIs improved bilaterally, TBI stable from prior study -Needs to continue ASA and statin  daily and Plavix for 1 year - advised him to follow up sooner should he develop new or worsening lower extremity symptoms - He will follow up in 6 months with repeat ABI and LLE arterial duplex   Karoline Caldwell, PA-C [facility] [phone]  Clinic MD:   Roxanne Mins

## 2021-03-19 ENCOUNTER — Other Ambulatory Visit: Payer: Self-pay

## 2021-03-19 DIAGNOSIS — I70222 Atherosclerosis of native arteries of extremities with rest pain, left leg: Secondary | ICD-10-CM

## 2021-03-25 ENCOUNTER — Ambulatory Visit (INDEPENDENT_AMBULATORY_CARE_PROVIDER_SITE_OTHER): Payer: Medicare Other | Admitting: Podiatry

## 2021-03-25 ENCOUNTER — Other Ambulatory Visit: Payer: Self-pay | Admitting: Podiatry

## 2021-03-25 ENCOUNTER — Other Ambulatory Visit: Payer: Self-pay

## 2021-03-25 ENCOUNTER — Encounter: Payer: Self-pay | Admitting: Podiatry

## 2021-03-25 DIAGNOSIS — M79676 Pain in unspecified toe(s): Secondary | ICD-10-CM

## 2021-03-25 DIAGNOSIS — I739 Peripheral vascular disease, unspecified: Secondary | ICD-10-CM | POA: Diagnosis not present

## 2021-03-25 DIAGNOSIS — G5782 Other specified mononeuropathies of left lower limb: Secondary | ICD-10-CM

## 2021-03-25 DIAGNOSIS — G5762 Lesion of plantar nerve, left lower limb: Secondary | ICD-10-CM

## 2021-03-25 DIAGNOSIS — B351 Tinea unguium: Secondary | ICD-10-CM | POA: Diagnosis not present

## 2021-03-25 MED ORDER — OXYCODONE-ACETAMINOPHEN 10-325 MG PO TABS: 1.0000 | ORAL_TABLET | Freq: Three times a day (TID) | ORAL | 0 refills | Status: AC | PRN

## 2021-03-25 NOTE — Progress Notes (Signed)
He presents today states the foot still sore but the leg seems to be getting a little better.  It also like his painful toenails cut.  Objective: Vital signs are stable he is alert oriented x3.  Pulses remain nonpalpable but he does have venous distention but his feet are warm to the touch so I do believe his stenting is working.  He still has pain on palpation to the third intermetatarsal space left.  Elongated thick yellow dystrophic-like mycotic nails  Assessment: Stump neuroma revascularize left leg.  Pain in limb secondary to onychomycosis.  He is on blood thinner.  Plan: Debridement of toenails 1 through 5 bilateral injected 2 cc of 4% dehydrated alcohol to the third interdigital space of the left foot.  Follow-up with him in a couple weeks and also send him his pain medicine.

## 2021-03-27 ENCOUNTER — Other Ambulatory Visit (HOSPITAL_COMMUNITY): Payer: Self-pay

## 2021-03-28 ENCOUNTER — Other Ambulatory Visit (HOSPITAL_COMMUNITY): Payer: Self-pay

## 2021-04-15 ENCOUNTER — Other Ambulatory Visit: Payer: Self-pay | Admitting: Podiatry

## 2021-04-15 ENCOUNTER — Encounter: Payer: Self-pay | Admitting: Podiatry

## 2021-04-15 ENCOUNTER — Other Ambulatory Visit: Payer: Self-pay

## 2021-04-15 ENCOUNTER — Ambulatory Visit (INDEPENDENT_AMBULATORY_CARE_PROVIDER_SITE_OTHER): Payer: Medicare Other | Admitting: Podiatry

## 2021-04-15 DIAGNOSIS — G5762 Lesion of plantar nerve, left lower limb: Secondary | ICD-10-CM

## 2021-04-15 DIAGNOSIS — G5782 Other specified mononeuropathies of left lower limb: Secondary | ICD-10-CM

## 2021-04-15 MED ORDER — OXYCODONE-ACETAMINOPHEN 10-325 MG PO TABS: 1.0000 | ORAL_TABLET | Freq: Three times a day (TID) | ORAL | 0 refills | Status: AC | PRN

## 2021-04-15 NOTE — Progress Notes (Signed)
He presents today for follow-up of neuroma states he was doing good for until the past 3 days was really been hurting.  Objective: Vital signs are stable alert oriented x3 pulses are palpable left now.  Still has pain on palpation third interdigital space left foot.  Assessment: Stump neuroma and vascular disease.  Plan: Reinjected 1 and half cc of dehydrated alcohol.  Represcribe his pain medication.

## 2021-04-30 ENCOUNTER — Other Ambulatory Visit: Payer: Self-pay | Admitting: Orthopedic Surgery

## 2021-04-30 DIAGNOSIS — M1812 Unilateral primary osteoarthritis of first carpometacarpal joint, left hand: Secondary | ICD-10-CM

## 2021-05-13 ENCOUNTER — Ambulatory Visit (INDEPENDENT_AMBULATORY_CARE_PROVIDER_SITE_OTHER): Payer: Medicare Other | Admitting: Podiatry

## 2021-05-13 ENCOUNTER — Other Ambulatory Visit: Payer: Self-pay

## 2021-05-13 ENCOUNTER — Encounter: Payer: Self-pay | Admitting: Podiatry

## 2021-05-13 ENCOUNTER — Other Ambulatory Visit: Payer: Self-pay | Admitting: Podiatry

## 2021-05-13 DIAGNOSIS — G5762 Lesion of plantar nerve, left lower limb: Secondary | ICD-10-CM | POA: Diagnosis not present

## 2021-05-13 DIAGNOSIS — G5782 Other specified mononeuropathies of left lower limb: Secondary | ICD-10-CM

## 2021-05-13 MED ORDER — OXYCODONE-ACETAMINOPHEN 10-325 MG PO TABS: 1.0000 | ORAL_TABLET | Freq: Three times a day (TID) | ORAL | 0 refills | Status: AC | PRN

## 2021-05-13 NOTE — Progress Notes (Unsigned)
per

## 2021-05-13 NOTE — Progress Notes (Signed)
He presents today still complaining of pain to the third interdigital space of the left foot.  Pulses are barely palpable but appear to be getting better he says.  Objective: Vital signs are stable he is alert and oriented x3 pulses barely palpable left lower extremity color to the foot appears to be much better since his stenting.  Continues to take his blood thinners.  Still has pain on palpation third interdigital space left foot.  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  Plan: I injected the area today with dehydrated alcohol and local anesthetic also sent a prescription of for Percocet in for him.  And I will follow-up with him in 3 to 4 weeks

## 2021-05-20 ENCOUNTER — Ambulatory Visit
Admission: RE | Admit: 2021-05-20 | Discharge: 2021-05-20 | Disposition: A | Discharge status: Home / Self Care | Payer: Medicare Other | Source: Ambulatory Visit | Attending: Orthopedic Surgery | Admitting: Orthopedic Surgery

## 2021-05-20 ENCOUNTER — Other Ambulatory Visit: Payer: Self-pay

## 2021-05-20 DIAGNOSIS — M1812 Unilateral primary osteoarthritis of first carpometacarpal joint, left hand: Secondary | ICD-10-CM

## 2021-05-21 ENCOUNTER — Other Ambulatory Visit: Payer: Self-pay | Admitting: Nurse Practitioner

## 2021-05-21 DIAGNOSIS — Z720 Tobacco use: Secondary | ICD-10-CM

## 2021-05-21 DIAGNOSIS — Z122 Encounter for screening for malignant neoplasm of respiratory organs: Secondary | ICD-10-CM

## 2021-05-24 ENCOUNTER — Other Ambulatory Visit: Payer: Self-pay

## 2021-05-24 DIAGNOSIS — I70222 Atherosclerosis of native arteries of extremities with rest pain, left leg: Secondary | ICD-10-CM

## 2021-05-24 DIAGNOSIS — I739 Peripheral vascular disease, unspecified: Secondary | ICD-10-CM

## 2021-05-27 ENCOUNTER — Other Ambulatory Visit: Payer: Self-pay

## 2021-05-27 ENCOUNTER — Other Ambulatory Visit: Payer: Self-pay | Admitting: Podiatry

## 2021-05-27 ENCOUNTER — Encounter: Payer: Self-pay | Admitting: Podiatry

## 2021-05-27 ENCOUNTER — Ambulatory Visit (INDEPENDENT_AMBULATORY_CARE_PROVIDER_SITE_OTHER): Payer: Medicare Other | Admitting: Podiatry

## 2021-05-27 DIAGNOSIS — G5782 Other specified mononeuropathies of left lower limb: Secondary | ICD-10-CM

## 2021-05-27 DIAGNOSIS — G5762 Lesion of plantar nerve, left lower limb: Secondary | ICD-10-CM | POA: Diagnosis not present

## 2021-05-27 MED ORDER — OXYCODONE-ACETAMINOPHEN 10-325 MG PO TABS: 1.0000 | ORAL_TABLET | Freq: Three times a day (TID) | ORAL | 0 refills | Status: AC | PRN

## 2021-05-27 NOTE — Progress Notes (Unsigned)
per

## 2021-05-28 DIAGNOSIS — M19042 Primary osteoarthritis, left hand: Secondary | ICD-10-CM | POA: Insufficient documentation

## 2021-05-28 NOTE — Progress Notes (Signed)
Presents today for follow-up of his neuroma third interdigital space left foot.  States that is been bothering him he still.  Objective: Vital signs stable alert oriented x3 pulses are barely palpable left.  Foot is warm to the touch capillary fill time is immediate pain on palpation to the third interspace left foot.  Assessment: Pain in limb secondary to stump neuroma.  Plan: Injected dehydrated alcohol today after sterile Betadine skin prep a prescription for his Percocet.  Follow-up with him in a few weeks.  Vascular surgery states that they cannot see him until 6 months after his initial procedure for follow-up ABIs.  At that time we may need to consider surgical intervention.

## 2021-06-06 ENCOUNTER — Other Ambulatory Visit: Payer: Medicare Other

## 2021-06-17 ENCOUNTER — Other Ambulatory Visit: Payer: Self-pay

## 2021-06-17 ENCOUNTER — Ambulatory Visit (INDEPENDENT_AMBULATORY_CARE_PROVIDER_SITE_OTHER): Payer: Medicare Other | Admitting: Podiatry

## 2021-06-17 DIAGNOSIS — G5782 Other specified mononeuropathies of left lower limb: Secondary | ICD-10-CM

## 2021-06-17 DIAGNOSIS — G5762 Lesion of plantar nerve, left lower limb: Secondary | ICD-10-CM | POA: Diagnosis not present

## 2021-06-17 MED ORDER — OXYCODONE-ACETAMINOPHEN 10-325 MG PO TABS: 1.0000 | ORAL_TABLET | Freq: Three times a day (TID) | ORAL | 0 refills | Status: AC | PRN

## 2021-06-17 NOTE — Progress Notes (Signed)
He presents for follow-up of his neuroma left foot.  States that is really not getting any better he is ready to get this thing out here if he can do it at all.  States that his pain is a 10 out of 10 is sharp shooting when he comes but the medicine that we put in it usually helps for 2 or 3 weeks.  Objective: Vital signs are stable he is alert oriented x3 pulses are palpable.  He has pain on palpation third interspace of the left foot palpable Mulder's click.  Assessment: Pain in limb secondary to neuroma third interspace left foot.  Plan: Reinjected dehydrated alcohol third interspace left foot.  I will prescribe him 5 days worth of Percocet I will follow-up with him in 1 month.

## 2021-07-07 ENCOUNTER — Inpatient Hospital Stay (HOSPITAL_COMMUNITY)
Admission: EM | Admit: 2021-07-07 | Discharge: 2021-07-10 | DRG: 083 | Disposition: A | Discharge status: Home Health | Payer: Medicare Other | Attending: Family Medicine | Admitting: Family Medicine

## 2021-07-07 ENCOUNTER — Emergency Department (HOSPITAL_COMMUNITY): Payer: Medicare Other

## 2021-07-07 DIAGNOSIS — S06360A Traumatic hemorrhage of cerebrum, unspecified, without loss of consciousness, initial encounter: Secondary | ICD-10-CM

## 2021-07-07 DIAGNOSIS — S0101XA Laceration without foreign body of scalp, initial encounter: Secondary | ICD-10-CM | POA: Diagnosis present

## 2021-07-07 DIAGNOSIS — Z23 Encounter for immunization: Secondary | ICD-10-CM | POA: Diagnosis present

## 2021-07-07 DIAGNOSIS — R402132 Coma scale, eyes open, to sound, at arrival to emergency department: Secondary | ICD-10-CM | POA: Diagnosis present

## 2021-07-07 DIAGNOSIS — I5032 Chronic diastolic (congestive) heart failure: Secondary | ICD-10-CM | POA: Diagnosis present

## 2021-07-07 DIAGNOSIS — Z961 Presence of intraocular lens: Secondary | ICD-10-CM | POA: Diagnosis present

## 2021-07-07 DIAGNOSIS — M25521 Pain in right elbow: Secondary | ICD-10-CM

## 2021-07-07 DIAGNOSIS — N401 Enlarged prostate with lower urinary tract symptoms: Secondary | ICD-10-CM | POA: Diagnosis present

## 2021-07-07 DIAGNOSIS — W19XXXA Unspecified fall, initial encounter: Principal | ICD-10-CM

## 2021-07-07 DIAGNOSIS — S02832A Fracture of medial orbital wall, left side, initial encounter for closed fracture: Secondary | ICD-10-CM | POA: Diagnosis present

## 2021-07-07 DIAGNOSIS — E1151 Type 2 diabetes mellitus with diabetic peripheral angiopathy without gangrene: Secondary | ICD-10-CM | POA: Diagnosis present

## 2021-07-07 DIAGNOSIS — E1122 Type 2 diabetes mellitus with diabetic chronic kidney disease: Secondary | ICD-10-CM | POA: Diagnosis present

## 2021-07-07 DIAGNOSIS — J449 Chronic obstructive pulmonary disease, unspecified: Secondary | ICD-10-CM | POA: Diagnosis present

## 2021-07-07 DIAGNOSIS — N390 Urinary tract infection, site not specified: Secondary | ICD-10-CM | POA: Diagnosis present

## 2021-07-07 DIAGNOSIS — D631 Anemia in chronic kidney disease: Secondary | ICD-10-CM

## 2021-07-07 DIAGNOSIS — I629 Nontraumatic intracranial hemorrhage, unspecified: Secondary | ICD-10-CM

## 2021-07-07 DIAGNOSIS — Z8249 Family history of ischemic heart disease and other diseases of the circulatory system: Secondary | ICD-10-CM

## 2021-07-07 DIAGNOSIS — I130 Hypertensive heart and chronic kidney disease with heart failure and stage 1 through stage 4 chronic kidney disease, or unspecified chronic kidney disease: Secondary | ICD-10-CM | POA: Diagnosis present

## 2021-07-07 DIAGNOSIS — Z9841 Cataract extraction status, right eye: Secondary | ICD-10-CM

## 2021-07-07 DIAGNOSIS — D62 Acute posthemorrhagic anemia: Secondary | ICD-10-CM | POA: Diagnosis present

## 2021-07-07 DIAGNOSIS — S022XXA Fracture of nasal bones, initial encounter for closed fracture: Secondary | ICD-10-CM | POA: Diagnosis present

## 2021-07-07 DIAGNOSIS — F17210 Nicotine dependence, cigarettes, uncomplicated: Secondary | ICD-10-CM | POA: Diagnosis present

## 2021-07-07 DIAGNOSIS — S0292XB Unspecified fracture of facial bones, initial encounter for open fracture: Secondary | ICD-10-CM

## 2021-07-07 DIAGNOSIS — N189 Chronic kidney disease, unspecified: Secondary | ICD-10-CM

## 2021-07-07 DIAGNOSIS — I5030 Unspecified diastolic (congestive) heart failure: Secondary | ICD-10-CM | POA: Diagnosis not present

## 2021-07-07 DIAGNOSIS — Z981 Arthrodesis status: Secondary | ICD-10-CM

## 2021-07-07 DIAGNOSIS — D649 Anemia, unspecified: Secondary | ICD-10-CM

## 2021-07-07 DIAGNOSIS — N179 Acute kidney failure, unspecified: Secondary | ICD-10-CM | POA: Diagnosis present

## 2021-07-07 DIAGNOSIS — R402362 Coma scale, best motor response, obeys commands, at arrival to emergency department: Secondary | ICD-10-CM | POA: Diagnosis present

## 2021-07-07 DIAGNOSIS — S0292XA Unspecified fracture of facial bones, initial encounter for closed fracture: Secondary | ICD-10-CM

## 2021-07-07 DIAGNOSIS — N1832 Chronic kidney disease, stage 3b: Secondary | ICD-10-CM | POA: Diagnosis present

## 2021-07-07 DIAGNOSIS — I493 Ventricular premature depolarization: Secondary | ICD-10-CM | POA: Diagnosis present

## 2021-07-07 DIAGNOSIS — S0240DA Maxillary fracture, left side, initial encounter for closed fracture: Secondary | ICD-10-CM | POA: Diagnosis present

## 2021-07-07 DIAGNOSIS — I2510 Atherosclerotic heart disease of native coronary artery without angina pectoris: Secondary | ICD-10-CM | POA: Diagnosis present

## 2021-07-07 DIAGNOSIS — Y92009 Unspecified place in unspecified non-institutional (private) residence as the place of occurrence of the external cause: Secondary | ICD-10-CM | POA: Diagnosis not present

## 2021-07-07 DIAGNOSIS — S020XXA Fracture of vault of skull, initial encounter for closed fracture: Secondary | ICD-10-CM | POA: Diagnosis present

## 2021-07-07 DIAGNOSIS — Z833 Family history of diabetes mellitus: Secondary | ICD-10-CM

## 2021-07-07 DIAGNOSIS — Z79899 Other long term (current) drug therapy: Secondary | ICD-10-CM

## 2021-07-07 DIAGNOSIS — E782 Mixed hyperlipidemia: Secondary | ICD-10-CM | POA: Diagnosis present

## 2021-07-07 DIAGNOSIS — E1142 Type 2 diabetes mellitus with diabetic polyneuropathy: Secondary | ICD-10-CM | POA: Diagnosis present

## 2021-07-07 DIAGNOSIS — H409 Unspecified glaucoma: Secondary | ICD-10-CM | POA: Diagnosis present

## 2021-07-07 DIAGNOSIS — Z7902 Long term (current) use of antithrombotics/antiplatelets: Secondary | ICD-10-CM

## 2021-07-07 DIAGNOSIS — R402242 Coma scale, best verbal response, confused conversation, at arrival to emergency department: Secondary | ICD-10-CM | POA: Diagnosis present

## 2021-07-07 DIAGNOSIS — K219 Gastro-esophageal reflux disease without esophagitis: Secondary | ICD-10-CM | POA: Diagnosis present

## 2021-07-07 DIAGNOSIS — S062X9A Diffuse traumatic brain injury with loss of consciousness of unspecified duration, initial encounter: Secondary | ICD-10-CM | POA: Diagnosis present

## 2021-07-07 DIAGNOSIS — Z8546 Personal history of malignant neoplasm of prostate: Secondary | ICD-10-CM

## 2021-07-07 DIAGNOSIS — S0240FA Zygomatic fracture, left side, initial encounter for closed fracture: Secondary | ICD-10-CM | POA: Diagnosis present

## 2021-07-07 DIAGNOSIS — Z20822 Contact with and (suspected) exposure to covid-19: Secondary | ICD-10-CM | POA: Diagnosis present

## 2021-07-07 DIAGNOSIS — R350 Frequency of micturition: Secondary | ICD-10-CM | POA: Diagnosis present

## 2021-07-07 DIAGNOSIS — R5381 Other malaise: Secondary | ICD-10-CM | POA: Diagnosis present

## 2021-07-07 DIAGNOSIS — Z7982 Long term (current) use of aspirin: Secondary | ICD-10-CM

## 2021-07-07 DIAGNOSIS — G629 Polyneuropathy, unspecified: Secondary | ICD-10-CM

## 2021-07-07 DIAGNOSIS — Z9842 Cataract extraction status, left eye: Secondary | ICD-10-CM

## 2021-07-07 DIAGNOSIS — R41841 Cognitive communication deficit: Secondary | ICD-10-CM | POA: Diagnosis present

## 2021-07-07 DIAGNOSIS — W1830XA Fall on same level, unspecified, initial encounter: Secondary | ICD-10-CM | POA: Diagnosis present

## 2021-07-07 DIAGNOSIS — Z885 Allergy status to narcotic agent status: Secondary | ICD-10-CM

## 2021-07-07 DIAGNOSIS — F32A Depression, unspecified: Secondary | ICD-10-CM | POA: Diagnosis present

## 2021-07-07 DIAGNOSIS — I951 Orthostatic hypotension: Secondary | ICD-10-CM | POA: Diagnosis present

## 2021-07-07 DIAGNOSIS — H1132 Conjunctival hemorrhage, left eye: Secondary | ICD-10-CM | POA: Diagnosis present

## 2021-07-07 LAB — URINALYSIS, ROUTINE W REFLEX MICROSCOPIC
Bilirubin Urine: NEGATIVE
Glucose, UA: NEGATIVE mg/dL
Ketones, ur: NEGATIVE mg/dL
Nitrite: NEGATIVE
Protein, ur: NEGATIVE mg/dL
Specific Gravity, Urine: 1.01 (ref 1.005–1.030)
pH: 5 (ref 5.0–8.0)

## 2021-07-07 LAB — CBC WITH DIFFERENTIAL/PLATELET
Abs Immature Granulocytes: 0.05 10*3/uL (ref 0.00–0.07)
Basophils Absolute: 0.1 10*3/uL (ref 0.0–0.1)
Basophils Relative: 1 %
Eosinophils Absolute: 0 10*3/uL (ref 0.0–0.5)
Eosinophils Relative: 0 %
HCT: 22.5 % — ABNORMAL LOW (ref 39.0–52.0)
Hemoglobin: 6.5 g/dL — CL (ref 13.0–17.0)
Immature Granulocytes: 0 %
Lymphocytes Relative: 7 %
Lymphs Abs: 1 10*3/uL (ref 0.7–4.0)
MCH: 22.8 pg — ABNORMAL LOW (ref 26.0–34.0)
MCHC: 28.9 g/dL — ABNORMAL LOW (ref 30.0–36.0)
MCV: 78.9 fL — ABNORMAL LOW (ref 80.0–100.0)
Monocytes Absolute: 0.6 10*3/uL (ref 0.1–1.0)
Monocytes Relative: 4 %
Neutro Abs: 12.1 10*3/uL — ABNORMAL HIGH (ref 1.7–7.7)
Neutrophils Relative %: 88 %
Platelets: UNDETERMINED 10*3/uL (ref 150–400)
RBC: 2.85 MIL/uL — ABNORMAL LOW (ref 4.22–5.81)
RDW: 19.9 % — ABNORMAL HIGH (ref 11.5–15.5)
WBC: 13.9 10*3/uL — ABNORMAL HIGH (ref 4.0–10.5)
nRBC: 0 % (ref 0.0–0.2)

## 2021-07-07 LAB — I-STAT CHEM 8, ED
BUN: 30 mg/dL — ABNORMAL HIGH (ref 8–23)
Calcium, Ion: 1.2 mmol/L (ref 1.15–1.40)
Chloride: 109 mmol/L (ref 98–111)
Creatinine, Ser: 2.1 mg/dL — ABNORMAL HIGH (ref 0.61–1.24)
Glucose, Bld: 114 mg/dL — ABNORMAL HIGH (ref 70–99)
HCT: 21 % — ABNORMAL LOW (ref 39.0–52.0)
Hemoglobin: 7.1 g/dL — ABNORMAL LOW (ref 13.0–17.0)
Potassium: 4.3 mmol/L (ref 3.5–5.1)
Sodium: 139 mmol/L (ref 135–145)
TCO2: 21 mmol/L — ABNORMAL LOW (ref 22–32)

## 2021-07-07 LAB — BASIC METABOLIC PANEL
Anion gap: 11 (ref 5–15)
BUN: 28 mg/dL — ABNORMAL HIGH (ref 8–23)
CO2: 22 mmol/L (ref 22–32)
Calcium: 9.3 mg/dL (ref 8.9–10.3)
Chloride: 105 mmol/L (ref 98–111)
Creatinine, Ser: 1.97 mg/dL — ABNORMAL HIGH (ref 0.61–1.24)
GFR, Estimated: 34 mL/min — ABNORMAL LOW (ref >60)
Glucose, Bld: 120 mg/dL — ABNORMAL HIGH (ref 70–99)
Potassium: 4.3 mmol/L (ref 3.5–5.1)
Sodium: 138 mmol/L (ref 135–145)

## 2021-07-07 LAB — RESP PANEL BY RT-PCR (FLU A&B, COVID) ARPGX2
Influenza A by PCR: NEGATIVE
Influenza B by PCR: NEGATIVE
SARS Coronavirus 2 by RT PCR: NEGATIVE

## 2021-07-07 LAB — PREPARE RBC (CROSSMATCH)

## 2021-07-07 LAB — CBG MONITORING, ED: Glucose-Capillary: 119 mg/dL — ABNORMAL HIGH (ref 70–99)

## 2021-07-07 MED ORDER — LATANOPROST 0.005 % OP SOLN
1.0000 [drp] | Freq: Every day | OPHTHALMIC | Status: DC
  Administered 2021-07-07 – 2021-07-09 (×3): 1 [drp] via OPHTHALMIC
  Filled 2021-07-07: qty 2.5

## 2021-07-07 MED ORDER — ACETAMINOPHEN 500 MG PO TABS
1000.0000 mg | ORAL_TABLET | ORAL | Status: AC
  Administered 2021-07-07: 1000 mg via ORAL
  Filled 2021-07-07: qty 2

## 2021-07-07 MED ORDER — SODIUM CHLORIDE 0.9 % IV SOLN
10.0000 mL/h | Freq: Once | INTRAVENOUS | Status: AC
  Administered 2021-07-07: 10 mL/h via INTRAVENOUS

## 2021-07-07 MED ORDER — SENNOSIDES-DOCUSATE SODIUM 8.6-50 MG PO TABS: 1.0000 | ORAL_TABLET | Freq: Every evening | ORAL | Status: DC | PRN

## 2021-07-07 MED ORDER — HYDRALAZINE HCL 20 MG/ML IJ SOLN: 5.0000 mg | Freq: Four times a day (QID) | INTRAMUSCULAR | Status: DC | PRN

## 2021-07-07 MED ORDER — FERROUS SULFATE 325 (65 FE) MG PO TABS
325.0000 mg | ORAL_TABLET | Freq: Every day | ORAL | Status: DC
  Administered 2021-07-08 – 2021-07-10 (×3): 325 mg via ORAL
  Filled 2021-07-07 (×3): qty 1

## 2021-07-07 MED ORDER — MECLIZINE HCL 12.5 MG PO TABS
25.0000 mg | ORAL_TABLET | Freq: Every day | ORAL | Status: DC | PRN
  Administered 2021-07-09: 25 mg via ORAL
  Filled 2021-07-07: qty 1
  Filled 2021-07-07: qty 2

## 2021-07-07 MED ORDER — MIRABEGRON ER 25 MG PO TB24
25.0000 mg | ORAL_TABLET | Freq: Every day | ORAL | Status: DC
  Administered 2021-07-08 – 2021-07-10 (×3): 25 mg via ORAL
  Filled 2021-07-07 (×3): qty 1

## 2021-07-07 MED ORDER — SODIUM CHLORIDE 0.9 % IV SOLN
1.0000 g | INTRAVENOUS | Status: DC
  Administered 2021-07-07 – 2021-07-09 (×3): 1 g via INTRAVENOUS
  Filled 2021-07-07 (×3): qty 10

## 2021-07-07 MED ORDER — AMITRIPTYLINE HCL 25 MG PO TABS
25.0000 mg | ORAL_TABLET | Freq: Every day | ORAL | Status: DC
  Administered 2021-07-07 – 2021-07-09 (×3): 25 mg via ORAL
  Filled 2021-07-07 (×3): qty 1

## 2021-07-07 MED ORDER — TAMSULOSIN HCL 0.4 MG PO CAPS
0.4000 mg | ORAL_CAPSULE | Freq: Every day | ORAL | Status: DC
  Administered 2021-07-07 – 2021-07-09 (×3): 0.4 mg via ORAL
  Filled 2021-07-07 (×3): qty 1

## 2021-07-07 MED ORDER — LIDOCAINE-EPINEPHRINE (PF) 2 %-1:200000 IJ SOLN
10.0000 mL | Freq: Once | INTRAMUSCULAR | Status: DC
  Filled 2021-07-07: qty 20

## 2021-07-07 MED ORDER — BRIMONIDINE TARTRATE 0.2 % OP SOLN
1.0000 [drp] | Freq: Three times a day (TID) | OPHTHALMIC | Status: DC
  Administered 2021-07-07 – 2021-07-10 (×8): 1 [drp] via OPHTHALMIC
  Filled 2021-07-07: qty 5

## 2021-07-07 MED ORDER — CEFAZOLIN SODIUM-DEXTROSE 2-4 GM/100ML-% IV SOLN
2.0000 g | INTRAVENOUS | Status: AC
  Administered 2021-07-07: 2 g via INTRAVENOUS
  Filled 2021-07-07: qty 100

## 2021-07-07 MED ORDER — ACETAMINOPHEN 325 MG PO TABS
650.0000 mg | ORAL_TABLET | ORAL | Status: DC | PRN
  Administered 2021-07-08 – 2021-07-10 (×6): 650 mg via ORAL
  Filled 2021-07-07 (×6): qty 2

## 2021-07-07 MED ORDER — PRAVASTATIN SODIUM 40 MG PO TABS
80.0000 mg | ORAL_TABLET | Freq: Every day | ORAL | Status: DC
  Administered 2021-07-08 – 2021-07-10 (×3): 80 mg via ORAL
  Filled 2021-07-07 (×4): qty 2

## 2021-07-07 MED ORDER — BRINZOLAMIDE 1 % OP SUSP
1.0000 [drp] | Freq: Three times a day (TID) | OPHTHALMIC | Status: DC
  Administered 2021-07-07 – 2021-07-10 (×8): 1 [drp] via OPHTHALMIC
  Filled 2021-07-07: qty 10

## 2021-07-07 MED ORDER — ACETAMINOPHEN 650 MG RE SUPP: 650.0000 mg | RECTAL | Status: DC | PRN

## 2021-07-07 MED ORDER — TETANUS-DIPHTH-ACELL PERTUSSIS 5-2.5-18.5 LF-MCG/0.5 IM SUSY
0.5000 mL | PREFILLED_SYRINGE | Freq: Once | INTRAMUSCULAR | Status: AC
  Administered 2021-07-07: 0.5 mL via INTRAMUSCULAR
  Filled 2021-07-07: qty 0.5

## 2021-07-07 MED ORDER — METOPROLOL TARTRATE 12.5 MG HALF TABLET
12.5000 mg | ORAL_TABLET | Freq: Two times a day (BID) | ORAL | Status: DC
  Administered 2021-07-07 – 2021-07-10 (×6): 12.5 mg via ORAL
  Filled 2021-07-07 (×6): qty 1

## 2021-07-07 MED ORDER — ACETAMINOPHEN 160 MG/5ML PO SOLN: 650.0000 mg | ORAL | Status: DC | PRN

## 2021-07-07 MED ORDER — GABAPENTIN 300 MG PO CAPS
300.0000 mg | ORAL_CAPSULE | Freq: Every day | ORAL | Status: DC
  Administered 2021-07-07 – 2021-07-10 (×4): 300 mg via ORAL
  Filled 2021-07-07 (×4): qty 1

## 2021-07-07 MED ORDER — LOSARTAN POTASSIUM 50 MG PO TABS
100.0000 mg | ORAL_TABLET | Freq: Every day | ORAL | Status: DC
  Administered 2021-07-07 – 2021-07-08 (×2): 100 mg via ORAL
  Filled 2021-07-07 (×2): qty 2

## 2021-07-07 MED ORDER — STROKE: EARLY STAGES OF RECOVERY BOOK
Freq: Once | Status: DC
  Filled 2021-07-07 (×2): qty 1

## 2021-07-07 MED ORDER — OXYBUTYNIN CHLORIDE 5 MG PO TABS
5.0000 mg | ORAL_TABLET | Freq: Every morning | ORAL | Status: DC
  Administered 2021-07-08 – 2021-07-10 (×3): 5 mg via ORAL
  Filled 2021-07-07 (×3): qty 1

## 2021-07-07 NOTE — ED Provider Notes (Signed)
I provided a substantive portion of the care of this patient.  I personally performed the entirety of the history for this encounter.     Patient had an unwitnessed fall.  This occurred at home.  Reportedly patient is showing evidence of confusion although patient's wife had reported that he was showing some evidence of confusion even before his fall.  At this time patient is awake and alert can answer simple questions.  Laceration to the left temple.  Following commands.  No respiratory distress.  Agree with plan of management.   Charlesetta Shanks, MD 07/07/21 [phone]

## 2021-07-07 NOTE — ED Notes (Addendum)
Pt to CT

## 2021-07-07 NOTE — Progress Notes (Signed)
Orthopedic Tech Progress Note Patient Details:  Russell [name] 1941-12-17 [id number]  Level 2 trauma  Patient ID: Russell [name], male   DOB: 02-Feb-1941, 80 y.o.   MRN: [id number]  [name] 07/07/2021, 10:55 AM

## 2021-07-07 NOTE — ED Notes (Addendum)
Critical result for Hemoglobin 6.5. Alroy Bailiff, PA-C notified.

## 2021-07-07 NOTE — ED Provider Notes (Signed)
[facility] EMERGENCY DEPARTMENT Provider Note   CSN: [id number] Arrival date & time: 07/07/21  1011     History Chief Complaint  Patient presents with   Altered Mental Status   Fall   LEVEL 5 CAVEAT - ALTERED MENTAL STATUS  Russell [name] is a 80 y.o. male who presents to the ED today via EMS for fall. Per EMS wife called for fall. When EMS came patient was found sitting on the chair bleeding from his head. Blood was found on the top and bottom of the stairs. Per triage report "wife said that pt was altered even before the fall." Pt only oriented to self. There was question regarding whether or not pt was on a blood thinner.   Per chart review pt is on Plavix.   Pt cannot recall how he fell. He currently complains of a headache. No other complaints at this time.   Additional information obtained by wife - she last saw pt last night when they went to sleep and he was at baseline for him; not altered. She woke up this morning and found pt in the bathroom. She reports he was cleaning blood up from his face. He went to sit down in the living room and "fell out." Wife reports that pt did pass out but denies additional trauma. She reports finding a lot of blood at the top and bottom of their stairs outside and that pt was able to make it inside after the fall to clean himself up in the bathroom. She reports he seemed very confused at that time and called 9-1-1.   The history is provided by the patient, medical records, the EMS personnel and the spouse. The history is limited by the condition of the patient.      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   COPD (chronic obstructive pulmonary disease) ([place])    Coronary atherosclerosis    denied knowledge of this 10/27/16   Depression     Diverticulosis of colon    Dyspnea    ED (erectile dysfunction)    Full dentures    GERD (gastroesophageal reflux disease)    not current   Glaucoma    History of gastritis    History of scabies    2008   History of seizure    2013-- x2   idiopathic --  none since   History of syncope    03-22-2014  DX  VAGAL RESPONSE   Hyperlipidemia, mixed    Hypertension    Mitral regurgitation    Peripheral arterial disease ([place])    one vessel runoff bilaterally via peroneal arteries   Prostate cancer ([place])    Seizures ([place]) 03/2014   ? 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   Peripheral arterial disease ([place]) 01/23/2016   SLAC (scapholunate advanced collapse) of wrist 11/13/2015   Fever 02/22/2015   Epididymitis, left 02/22/2015   Cellulitis 02/22/2015   Orchitis 02/22/2015   Testicular abscess 02/22/2015   DM type 2 (diabetes mellitus, type 2) ([place]) 02/22/2015   Microcytic anemia 02/22/2015   Epididymitis 02/22/2015   UTI (lower urinary tract infection) 02/22/2015   Hypotension 03/22/2014   Neuroma digital nerve 09/28/2013   Neuroma, 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   SEIZURE, 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   Dehydration 07/03/2010   NAUSEA AND VOMITING 07/03/2010   BENIGN PROSTATIC HYPERTROPHY, WITH URINARY OBSTRUCTION 06/13/2010   ARM PAIN 06/03/2010   DYSPNEA ON EXERTION 05/15/2010   TOBACCO ABUSE 04/03/2010   COPD 04/03/2010   G E R D  04/03/2010   OTHER DYSPHAGIA 04/03/2010   PNEUMONIA, RIGHT LOWER LOBE 02/19/2010   DIABETES MELLITUS, TYPE II, CONTROLLED 01/14/2010   LUMBAR RADICULOPATHY, LEFT 05/16/2009   DYSPHAGIA UNSPECIFIED 05/16/2009   COUGH DUE TO ACE INHIBITORS 04/02/2009   DIZZINESS 07/27/2008   HYPERCHOLESTEROLEMIA, MIXED 05/11/2008   CATARACTS, BILATERAL 03/01/2008   MILD SPINAL STENOSIS, CERVICAL 01/12/2008   LEG CRAMPS 01/12/2008   EARLY SATIETY 11/04/2007   DIVERTICULOSIS, COLON 08/31/2007   SCABIES 08/26/2007   ERECTILE DYSFUNCTION 08/18/2007   ABUSE, ALCOHOL SUSPECTED 08/18/2007   Essential hypertension 08/17/2007   HEMOCCULT POSITIVE STOOL 07/05/2007   ANEMIA, IRON DEFICIENCY NOS 05/31/2007   WEIGHT LOSS, ABNORMAL 05/10/2006    Past Surgical History:  Procedure Laterality Date   ABDOMINAL AORTOGRAM W/LOWER EXTREMITY Left 12/18/2020   Procedure: ABDOMINAL AORTOGRAM W/LOWER EXTREMITY;  Surgeon: Cherre Robins, MD;  Location: [place] [facility];  Service: Cardiovascular;  Laterality: Left;   ABDOMINAL AORTOGRAM W/LOWER EXTREMITY N/A 02/26/2021   Procedure: ABDOMINAL AORTOGRAM W/LOWER EXTREMITY;  Surgeon: Cherre Robins, MD;  Location: [place] [facility];  Service: Cardiovascular;  Laterality: N/A;   ANTERIOR CERVICAL DECOMP/DISCECTOMY FUSION  10-16-2009   C4 -- C6   ANTERIOR CERVICAL DECOMP/DISCECTOMY FUSION N/A 01/27/2017   Procedure: ANTERIOR CERVICAL DECOMPRESSION FUSION CERVICAL 3-4 WITH INSTRUMENTATION AND ALLOGRAFT;  Surgeon: Phylliss Bob, MD;  Location: [place];  Service: Orthopedics;  Laterality: N/A;  ANTERIOR CERVICAL DECOMPRESSION FUSION CERVICAL 3-4 WITH INSTRUMENTATION AND ALLOGRAFT   CAPSULOTOMY Right 01/26/2013   Procedure: MINOR CAPSULOTOMY;  Surgeon: Myrtha Mantis., MD;  Location: [place];  Service: Ophthalmology;  Laterality: Right;   CARPAL TUNNEL RELEASE Left 01/02/2019   Procedure: LEFT CARPAL TUNNEL RELEASE;  Surgeon: Leanora Cover, MD;  Location: [place];   Service: Orthopedics;  Laterality: Left;   CARPECTOMY Right 03/13/2014   Procedure: RIGHT  PROXIMAL ROW CARPECTOMY;  Surgeon: Tennis Must, MD;  Location: [place];  Service: Orthopedics;  Laterality: Right;   CARPECTOMY Left 10/22/2015   Procedure: LEFT WRIST PROXIMAL ROW CARPECTOMY ;  Surgeon: Leanora Cover, MD;  Location: [place];  Service: Orthopedics;  Laterality: Left;   CARPOMETACARPAL ([place]) FUSION OF THUMB Right 03/13/2014   Procedure: RIGHT FUSION OF THUMB CARPOMETACARPAL [facility]) JOINT;  Surgeon: Tennis Must, MD;  Location: [place];  Service: Orthopedics;  Laterality: Right;   CARPOMETACARPEL SUSPENSION PLASTY Left 04/04/2020   Procedure: LEFT THUMB TRAPECIECTOMY AND SUSPENSIONPLASTY;  Surgeon: Leanora Cover, MD;  Location: [place];  Service: Orthopedics;  Laterality: Left;   CATARACT EXTRACTION W/ INTRAOCULAR LENS  IMPLANT, BILATERAL  2009   COLONOSCOPY  08-31-2007  COLONOSCOPY     CRYOABLATION N/A 01/14/2015   Procedure: CRYO ABLATION PROSTATE;  Surgeon: Ailene Rud, MD;  Location: [facility];  Service: Urology;  Laterality: N/A;   ESOPHAGOGASTRODUODENOSCOPY  last one 09-11-2008   EXCISION MASS LEFT FACE , SUBORBITAL AREA, PLASTER RECONSTRUCTION  02-09-2011   EXCISIONAL DEBRIDEMENT AND REPAIR RIGHT QUADRICEP TENDON  07-05-2000   FOOT SURGERY Left    I & D EXTREMITY Right 05/24/2013   Procedure: RIGHT INDEX WOUND DEBRIDEMENT AND CLOSURE;  Surgeon: Jolyn Nap, MD;  Location: [place];  Service: Orthopedics;  Laterality: Right;   INSERTION OF SUPRAPUBIC CATHETER N/A 01/14/2015   Procedure: SUPRAPUBIC TUBE PLACEMENT;  Surgeon: Ailene Rud, MD;  Location: [facility];  Service: Urology;  Laterality: N/A;   KNEE ARTHROSCOPY Right 2008   LARYNGOSCOPY AND ESOPHAGOSCOPY  06-13-2010   MARSUPIALIZATION LEFT LARGE VALLECULAR CYST   MASS EXCISION Left 10/22/2015    Procedure: EXCISION MASS OF LEFT INDEX FINGER;  Surgeon: Leanora Cover, MD;  Location: [place];  Service: Orthopedics;  Laterality: Left;   ORCHIECTOMY Left 02/24/2015   Procedure: SCROTAL EXPLORATION WITH LEFT ORCHIECTOMY;  Surgeon: Kathie Rhodes, MD;  Location: [place];  Service: Urology;  Laterality: Left;   PERIPHERAL VASCULAR INTERVENTION Left 02/26/2021   Procedure: PERIPHERAL VASCULAR INTERVENTION;  Surgeon: Cherre Robins, MD;  Location: [place] [facility];  Service: Cardiovascular;  Laterality: Left;  Superficial femoral   POSTERIOR CERVICAL FUSION/FORAMINOTOMY N/A 01/28/2017   Procedure: POSTERIOR SPINAL FUSION CERVICAL 3-4, CERVICAL 4-5, CERVICAL 5-6, CERVICAL 6-7 WITH INSTRUMENATION AND ALLOGRAFT;  Surgeon: Phylliss Bob, MD;  Location: [place];  Service: Orthopedics;  Laterality: N/A;  POSTERIOR SPINAL FUSION CERVICAL 3-4, CERVICAL 4-5, CERVICAL 5-6, CERVICAL 6-7 WITH INSTRUMENATION AND ALLOGRAFT   SHOULDER ARTHROSCOPY/ DEBRIDEMENT LABRAL TEAR/  BICEPS TENOTOMY Left 09-24-2011   TONSILLECTOMY  as child   TRANSTHORACIC ECHOCARDIOGRAM  02-21-2010   normal LVF/  ef 55-60%/ mild to moderate MR/  moderate LAE/  mild TR   YAG LASER APPLICATION Right 0/08/8118   Procedure: YAG LASER APPLICATION;  Surgeon: Myrtha Mantis., MD;  Location[facility];  Service: Ophthalmology;  Laterality: Right;   YAG LASER CAPSULOTOMY, LEFT EYE  01-08-2011       Family History  Problem Relation Age of Onset   Unexplained death Mother        died at a young age, no known cause   Heart attack Father        age unknown   Diabetes Other    Cancer Other     Social History   Tobacco Use   Smoking status: Every Day    Packs/day: 1.00    Years: 59.00    Pack years: 59.00    Types: Cigarettes   Smokeless tobacco: Never   Tobacco comments:    Started Chantix 10/26/16  Vaping Use   Vaping Use: Never used  Substance Use Topics   Alcohol use: No    Comment: none since approx 2014- heavy  before   Drug use: No    Home Medications Prior to Admission medications   Medication Sig Start Date End Date Taking? Authorizing Provider  amitriptyline (ELAVIL) 25 MG tablet Take 25 mg by mouth at bedtime. 07/22/20   [provider]  aspirin EC 81 MG tablet Take 1 tablet (81 mg total) by mouth daily. Swallow whole. 12/18/20 12/18/21  Cherre Robins, MD  AZOPT 1 % ophthalmic suspension Place  1 drop into both eyes 3 (three) times daily. 12/25/16   [provider]  bimatoprost (LUMIGAN) 0.01 % SOLN Place 1 drop into both eyes at bedtime. 04/10/13   Dhungel, Nishant, MD  brimonidine (ALPHAGAN) 0.2 % ophthalmic solution Place 1 drop into both eyes in the morning and at bedtime. 03/14/19   [provider]  clopidogrel (PLAVIX) 75 MG tablet Take 1 tablet (75 mg total) by mouth daily. 02/26/21 02/26/22  Cherre Robins, MD  clopidogrel (PLAVIX) 75 MG tablet TAKE 1 TABLET (75 MG TOTAL) BY MOUTH DAILY. 02/26/21 02/26/22  Cherre Robins, MD  esomeprazole (NEXIUM) 40 MG capsule Take 1 capsule (40 mg total) by mouth daily. Patient taking differently: Take 40 mg by mouth daily as needed (acid reflux/indigestion.). 07/16/17   Isaiah Serge, NP  Ferrous Sulfate ([employer] SLOW RELEASE IRON) 143 (45 Fe) MG TBCR Take 45 mg by mouth daily.    [provider]  gabapentin (NEURONTIN) 300 MG capsule Take 300 mg by mouth daily. 02/25/21   [provider]  losartan (COZAAR) 100 MG tablet Take 100 mg by mouth daily. 06/08/17   [provider]  meclizine (ANTIVERT) 25 MG tablet Take 25 mg by mouth daily as needed for dizziness. 06/16/17   [provider]  Multiple Vitamin (MULTIVITAMIN WITH MINERALS) TABS tablet Take 1 tablet by mouth daily.    [provider]  MYRBETRIQ 25 MG TB24 tablet Take 25 mg by mouth daily. 02/12/20   [provider]  Omega-3 Fatty Acids (FISH OIL) 1000 MG CPDR Take 1,000 mg by mouth daily.    [provider]  oxybutynin  (DITROPAN) 5 MG tablet Take 5 mg by mouth every morning. 11/13/19   [provider]  pravastatin (PRAVACHOL) 80 MG tablet Take 80 mg by mouth daily.  10/19/16   [provider]  tamsulosin (FLOMAX) 0.4 MG CAPS capsule Take 0.4 mg by mouth at bedtime.  12/03/16   [provider]    Allergies    Tramadol  Review of Systems   Review of Systems  Unable to perform ROS: Mental status change  Skin:  Positive for wound.  Neurological:  Positive for headaches.   Physical Exam Updated Vital Signs BP (!) 146/73   Pulse 71   Temp 97.6 F (36.4 C) (Oral)   Resp 15   SpO2 98%   Physical Exam Vitals and nursing note reviewed.  Constitutional:      Appearance: He is not ill-appearing.  HENT:     Head:     Comments: 3 cm gaping laceration to left temporal area; bleeding controlled.  + ecchymosis to left periorbital area. PERRL. EOMI. No hyphema appreciated. + diffuse TTP.  Eyes:     Extraocular Movements: Extraocular movements intact.     Conjunctiva/sclera: Conjunctivae normal.     Pupils: Pupils are equal, round, and reactive to light.  Cardiovascular:     Rate and Rhythm: Normal rate and regular rhythm.     Pulses: Normal pulses.  Pulmonary:     Effort: Pulmonary effort is normal.     Breath sounds: Normal breath sounds. No wheezing, rhonchi or rales.  Abdominal:     Palpations: Abdomen is soft.     Tenderness: There is no abdominal tenderness. There is no guarding or rebound.  Musculoskeletal:        General: Tenderness present.     Cervical back: Neck supple.     Comments: No deformity appreciated to L hip. No  leg shortening or external rotation. + TTP to left hip. ROM intact. Strength and sensation intact. 2+ DP pulse.   Skin:    General: Skin is warm and dry.  Neurological:     Mental Status: He is alert.     Comments: Oriented to self and place. Unable to provide year or month. Unable to provide additional information to situation.  Follows  commands without difficulty.     ED Results / Procedures / Treatments   Labs (all labs ordered are listed, but only abnormal results are displayed) Labs Reviewed  BASIC METABOLIC PANEL - Abnormal; Notable for the following components:      Result Value   Glucose, Bld 120 (*)    BUN 28 (*)    Creatinine, Ser 1.97 (*)    GFR, Estimated 34 (*)    All other components within normal limits  CBC WITH DIFFERENTIAL/PLATELET - Abnormal; Notable for the following components:   WBC 13.9 (*)    RBC 2.85 (*)    Hemoglobin 6.5 (*)    HCT 22.5 (*)    MCV 78.9 (*)    MCH 22.8 (*)    MCHC 28.9 (*)    RDW 19.9 (*)    All other components within normal limits  URINALYSIS, ROUTINE W REFLEX MICROSCOPIC - Abnormal; Notable for the following components:   Hgb urine dipstick SMALL (*)    Leukocytes,Ua LARGE (*)    Bacteria, UA RARE (*)    All other components within normal limits  CBG MONITORING, ED - Abnormal; Notable for the following components:   Glucose-Capillary 119 (*)    All other components within normal limits  I-STAT CHEM 8, ED - Abnormal; Notable for the following components:   BUN 30 (*)    Creatinine, Ser 2.10 (*)    Glucose, Bld 114 (*)    TCO2 21 (*)    Hemoglobin 7.1 (*)    HCT 21.0 (*)    All other components within normal limits  RESP PANEL BY RT-PCR (FLU A&B, COVID) [id number]  URINE CULTURE  TYPE AND SCREEN  PREPARE RBC (CROSSMATCH)    EKG None  Radiology DG ELBOW COMPLETE RIGHT (3+VIEW)  Result Date: 07/07/2021 CLINICAL DATA:  Fall and altered mental status EXAM: RIGHT ELBOW - COMPLETE 3+ VIEW COMPARISON:  None. FINDINGS: There is no evidence of acute fracture. The elbow is held in flexion resulting in a suboptimal lateral view, however there is no evidence of displaced posterior fat pad to suggest a joint effusion. There is mild ulnar trochlear joint degenerative change. IMPRESSION: No evidence of acute fracture. Electronically Signed   By: Maurine Simmering   On: 07/07/2021  13:58   CT Head Wo Contrast  Result Date: 07/07/2021 CLINICAL DATA:  [place] today striking head, level 2 fall, on blood thinners, LEFT frontal laceration, uncertain loss of consciousness EXAM: CT HEAD WITHOUT CONTRAST CT MAXILLOFACIAL WITHOUT CONTRAST CT CERVICAL SPINE WITHOUT CONTRAST TECHNIQUE: Multidetector CT imaging of the head, cervical spine, and maxillofacial structures were performed using the standard protocol without intravenous contrast. Multiplanar CT image reconstructions of the cervical spine and maxillofacial structures were also generated. COMPARISON:  CT head 11/23/2010 FINDINGS: CT HEAD FINDINGS Brain: Generalized atrophy. Normal ventricular morphology. No midline shift or mass effect. Dural calcifications in falx. Scattered high attenuation acute hemorrhage identified extra-axial at anterior aspect of interhemispheric fissure, in BILATERAL frontal regions, and at anterior aspect of LEFT middle cranial fossa consistent with acute intracranial hemorrhage. Small vessel chronic ischemic changes of  deep cerebral white matter. No intraparenchymal hemorrhage or infarction. No mass. Vascular: No hyperdense vessels. Atherosclerotic calcifications of internal carotid arteries at skull base Skull: Nondisplaced LEFT frontal bone fractures. Other: N/A CT MAXILLOFACIAL FINDINGS Osseous: TMJ alignment normal bilaterally. Nondisplaced fractures involving LEFT zygomatic arch, lateral and superolateral walls of the LEFT orbit, and LEFT frontal bone. LEFT frontal bone fracture extends obliquely to involve the LEFT frontal sinus. Fracture at medial wall LEFT orbit extending into base of LEFT nasal bone. Questionable tiny fracture at anterior LEFT maxilla. Orbits: Intraorbital soft tissue planes clear. LEFT periorbital contusion and soft tissue swelling. Sinuses: Fluid air-fluid levels within the LEFT frontal, sphenoid, and BILATERAL maxillary sinuses. Middle ear cavities and mastoid air cells clear Soft tissues:  Foci of soft tissue gas LEFT periorbital and at LEFT infratemporal fossa. CT CERVICAL SPINE FINDINGS Alignment: Normal Skull base and vertebrae: Prior anterior fusion C3-C4. Interval removal of prior anterior C4-C6 plate with intact bony fusion. Interval posterior fusion C3-C7. Disc space narrowing with endplate spur formation at C6-C7. Vertebral body heights maintained without acute fracture or subluxation. Scattered facet degenerative changes. Skull base intact. Soft tissues and spinal canal: Prevertebral soft tissues normal thickness. Atherosclerotic calcifications at carotid bifurcations bilaterally and at proximal great vessels. 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 Scattered acute extra-axial hemorrhage identified at anterior, interhemispheric, BILATERAL frontal, and anterior LEFT middle cranial fossa. No additional acute intracranial abnormalities. Nondisplaced LEFT frontal bone fractures. Nondisplaced fractures involving LEFT zygomatic arch, lateral and superolateral walls of the LEFT orbit, and LEFT frontal bone. Fracture at medial wall LEFT orbit extending into base of LEFT nasal bone. Questionable tiny transverse fracture at anterior LEFT maxilla. Degenerative disc and facet disease changes of the cervical spine. Prior anterior and posterior fusion changes of the cervical spine as above. No acute cervical spine abnormalities. Critical Value/emergent results were called by telephone at the time of interpretation on 07/07/2021 at 1145 hrs to provider [facility] Myca Perno PA, who verbally acknowledged these results. Electronically Signed   By: Lavonia Dana M.D.   On: 07/07/2021 11:47   CT Cervical Spine Wo Contrast  Result Date: 07/07/2021 CLINICAL DATA:  [place] today striking head, level 2 fall, on blood thinners, LEFT frontal laceration, uncertain loss of  consciousness EXAM: CT HEAD WITHOUT CONTRAST CT MAXILLOFACIAL WITHOUT CONTRAST CT CERVICAL SPINE WITHOUT CONTRAST TECHNIQUE: Multidetector CT imaging of the head, cervical spine, and maxillofacial structures were performed using the standard protocol without intravenous contrast. Multiplanar CT image reconstructions of the cervical spine and maxillofacial structures were also generated. COMPARISON:  CT head 11/23/2010 FINDINGS: CT HEAD FINDINGS Brain: Generalized atrophy. Normal ventricular morphology. No midline shift or mass effect. Dural calcifications in falx. Scattered high attenuation acute hemorrhage identified extra-axial at anterior aspect of interhemispheric fissure, in BILATERAL frontal regions, and at anterior aspect of LEFT middle cranial fossa consistent with acute intracranial hemorrhage. Small vessel chronic ischemic changes of deep cerebral white matter. No intraparenchymal hemorrhage or infarction. No mass. Vascular: No hyperdense vessels. Atherosclerotic calcifications of internal carotid arteries at skull base Skull: Nondisplaced LEFT frontal bone fractures. Other: N/A CT MAXILLOFACIAL FINDINGS Osseous: TMJ alignment normal bilaterally. Nondisplaced fractures involving LEFT zygomatic arch, lateral and superolateral walls of the LEFT orbit, and LEFT frontal bone. LEFT frontal bone fracture extends obliquely to involve the LEFT frontal sinus. Fracture at medial wall LEFT orbit extending into base of LEFT nasal  bone. Questionable tiny fracture at anterior LEFT maxilla. Orbits: Intraorbital soft tissue planes clear. LEFT periorbital contusion and soft tissue swelling. Sinuses: Fluid air-fluid levels within the LEFT frontal, sphenoid, and BILATERAL maxillary sinuses. Middle ear cavities and mastoid air cells clear Soft tissues: Foci of soft tissue gas LEFT periorbital and at LEFT infratemporal fossa. CT CERVICAL SPINE FINDINGS Alignment: Normal Skull base and vertebrae: Prior anterior fusion C3-C4.  Interval removal of prior anterior C4-C6 plate with intact bony fusion. Interval posterior fusion C3-C7. Disc space narrowing with endplate spur formation at C6-C7. Vertebral body heights maintained without acute fracture or subluxation. Scattered facet degenerative changes. Skull base intact. Soft tissues and spinal canal: Prevertebral soft tissues normal thickness. Atherosclerotic calcifications at carotid bifurcations bilaterally and at proximal great vessels. 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 Scattered acute extra-axial hemorrhage identified at anterior, interhemispheric, BILATERAL frontal, and anterior LEFT middle cranial fossa. No additional acute intracranial abnormalities. Nondisplaced LEFT frontal bone fractures. Nondisplaced fractures involving LEFT zygomatic arch, lateral and superolateral walls of the LEFT orbit, and LEFT frontal bone. Fracture at medial wall LEFT orbit extending into base of LEFT nasal bone. Questionable tiny transverse fracture at anterior LEFT maxilla. Degenerative disc and facet disease changes of the cervical spine. Prior anterior and posterior fusion changes of the cervical spine as above. No acute cervical spine abnormalities. Critical Value/emergent results were called by telephone at the time of interpretation on 07/07/2021 at 1145 hrs to provider [facility] Kassim Guertin PA, who verbally acknowledged these results. Electronically Signed   By: Lavonia Dana M.D.   On: 07/07/2021 11:47   DG HIP UNILAT WITH PELVIS 2-3 VIEWS RIGHT  Result Date: 07/07/2021 CLINICAL DATA:  Right hip pain after fall today. EXAM: DG HIP (WITH OR WITHOUT PELVIS) 2-3V RIGHT COMPARISON:  None. FINDINGS: There is no evidence of hip fracture or dislocation. There is no evidence of arthropathy or other focal bone abnormality. IMPRESSION: Negative.  Electronically Signed   By: Marijo Conception M.D.   On: 07/07/2021 11:43   CT Maxillofacial WO CM  Result Date: 07/07/2021 CLINICAL DATA:  [place] today striking head, level 2 fall, on blood thinners, LEFT frontal laceration, uncertain loss of consciousness EXAM: CT HEAD WITHOUT CONTRAST CT MAXILLOFACIAL WITHOUT CONTRAST CT CERVICAL SPINE WITHOUT CONTRAST TECHNIQUE: Multidetector CT imaging of the head, cervical spine, and maxillofacial structures were performed using the standard protocol without intravenous contrast. Multiplanar CT image reconstructions of the cervical spine and maxillofacial structures were also generated. COMPARISON:  CT head 11/23/2010 FINDINGS: CT HEAD FINDINGS Brain: Generalized atrophy. Normal ventricular morphology. No midline shift or mass effect. Dural calcifications in falx. Scattered high attenuation acute hemorrhage identified extra-axial at anterior aspect of interhemispheric fissure, in BILATERAL frontal regions, and at anterior aspect of LEFT middle cranial fossa consistent with acute intracranial hemorrhage. Small vessel chronic ischemic changes of deep cerebral white matter. No intraparenchymal hemorrhage or infarction. No mass. Vascular: No hyperdense vessels. Atherosclerotic calcifications of internal carotid arteries at skull base Skull: Nondisplaced LEFT frontal bone fractures. Other: N/A CT MAXILLOFACIAL FINDINGS Osseous: TMJ alignment normal bilaterally. Nondisplaced fractures involving LEFT zygomatic arch, lateral and superolateral walls of the LEFT orbit, and LEFT frontal bone. LEFT frontal bone fracture extends obliquely to involve the LEFT frontal sinus. Fracture at medial wall LEFT orbit extending into base of LEFT nasal bone. Questionable tiny fracture at anterior LEFT maxilla. Orbits: Intraorbital  soft tissue planes clear. LEFT periorbital contusion and soft tissue swelling. Sinuses: Fluid air-fluid levels within the LEFT frontal, sphenoid, and BILATERAL maxillary  sinuses. Middle ear cavities and mastoid air cells clear Soft tissues: Foci of soft tissue gas LEFT periorbital and at LEFT infratemporal fossa. CT CERVICAL SPINE FINDINGS Alignment: Normal Skull base and vertebrae: Prior anterior fusion C3-C4. Interval removal of prior anterior C4-C6 plate with intact bony fusion. Interval posterior fusion C3-C7. Disc space narrowing with endplate spur formation at C6-C7. Vertebral body heights maintained without acute fracture or subluxation. Scattered facet degenerative changes. Skull base intact. Soft tissues and spinal canal: Prevertebral soft tissues normal thickness. Atherosclerotic calcifications at carotid bifurcations bilaterally and at proximal great vessels. 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 Scattered acute extra-axial hemorrhage identified at anterior, interhemispheric, BILATERAL frontal, and anterior LEFT middle cranial fossa. No additional acute intracranial abnormalities. Nondisplaced LEFT frontal bone fractures. Nondisplaced fractures involving LEFT zygomatic arch, lateral and superolateral walls of the LEFT orbit, and LEFT frontal bone. Fracture at medial wall LEFT orbit extending into base of LEFT nasal bone. Questionable tiny transverse fracture at anterior LEFT maxilla. Degenerative disc and facet disease changes of the cervical spine. Prior anterior and posterior fusion changes of the cervical spine as above. No acute cervical spine abnormalities. Critical Value/emergent results were called by telephone at the time of interpretation on 07/07/2021 at 1145 hrs to provider [facility] Carolene Gitto PA, who verbally acknowledged these results. Electronically Signed   By: Lavonia Dana M.D.   On: 07/07/2021 11:47    Procedures .Marland KitchenLaceration Repair  Date/Time: 07/07/2021 2:49 PM Performed by: Eustaquio Maize,  PA-C Authorized by: Eustaquio Maize, PA-C   Consent:    Consent obtained:  Verbal   Consent given by:  Patient   Risks discussed:  Infection, pain, poor cosmetic result and retained foreign body Universal protocol:    Patient identity confirmed:  Verbally with patient Anesthesia:    Anesthesia method:  Local infiltration   Local anesthetic:  Lidocaine 2% WITH epi Laceration details:    Location:  Scalp   Scalp location:  Frontal   Length (cm):  3   Depth (mm):  4 Pre-procedure details:    Preparation:  Patient was prepped and draped in usual sterile fashion Treatment:    Area cleansed with:  Povidone-iodine   Irrigation solution:  Sterile saline   Irrigation volume:  1 L   Irrigation method:  Pressure wash   Visualized foreign bodies/material removed: yes (extensive amount of gravel/debris appreciated in wound)     Layers/structures repaired:  Deep subcutaneous Deep subcutaneous:    Suture size:  4-0   Suture material:  Vicryl   Suture technique:  Horizontal mattress   Number of sutures:  2 Skin repair:    Repair method:  Sutures   Suture size:  5-0   Suture material:  Prolene   Suture technique:  Simple interrupted   Number of sutures:  5 Approximation:    Approximation:  Close Repair type:    Repair type:  Complex Post-procedure details:    Dressing:  Open (no dressing)   Procedure completion:  Tolerated well, no immediate complications .Critical Care  Date/Time: 07/07/2021 2:53 PM Performed by: Eustaquio Maize, PA-C Authorized by: Eustaquio Maize, PA-C   Critical care provider statement:    Critical care time (minutes):  45   Critical care was necessary to  treat or prevent imminent or life-threatening deterioration of the following conditions: anemia.   Critical care was time spent personally by me on the following activities:  Discussions with consultants, evaluation of patient's response to treatment, examination of patient, ordering and performing treatments  and interventions, ordering and review of laboratory studies, ordering and review of radiographic studies, pulse oximetry, re-evaluation of patient's condition, obtaining history from patient or surrogate and review of old charts   Medications Ordered in ED Medications  lidocaine-EPINEPHrine (XYLOCAINE W/EPI) 2 %-1:200000 (PF) injection 10 mL (10 mLs Infiltration Handoff 07/07/21 1258)  0.9 %  sodium chloride infusion (has no administration in time range)  acetaminophen (TYLENOL) tablet 1,000 mg (has no administration in time range)  ceFAZolin (ANCEF) IVPB 2g/100 mL premix (2 g Intravenous New Bag/Given 07/07/21 1301)  Tdap (BOOSTRIX) injection 0.5 mL (0.5 mLs Intramuscular Given 07/07/21 1255)    ED Course  I have reviewed the triage vital signs and the nursing notes.  Pertinent labs & imaging results that were available during my care of the patient were reviewed by me and considered in my medical decision making (see chart for details).  Clinical Course as of 07/07/21 1547  Mon Jul 07, 2021  1346 Hemoglobin(!!): 6.5 [MV]    Clinical Course User Index [MV] Eustaquio Maize, PA-C   MDM Rules/Calculators/A&P                          80 year old male who presents to the ED today status post unwitnessed fall that occurred sometime this morning.  Wife found patient in bathroom cleaning up blood from his face from a laceration.  She reported that he seemed altered prompting EMS call.  There is some confusion whether patient was on a blood thinner or not and therefore trauma was not activated however per chart review patient is on Plavix.  Level 2 trauma activated at this time.  On arrival to the ED vitals are stable patient appears to be in no acute distress.  He is alert and oriented times self and place.  He is unable to provide additional information regarding the situation and he does not know what year or month it is.  Awaiting wife's arrival for further information.  We will plan for CT head,  CT maxillofacial, CT C-spine at this time.  On my exam patient is also having some left hip tenderness palpation however no external rotation or shortening at this time.  We will plan for x-ray.  Lab work obtained as well  Received call from radiologist Dr. Darcella Cheshire regarding CT scan - intracranial hemorrhage and subarachnoid with multiple facial fractures. Pt does have fracture to left frontal bone where fracture resides; open fracture orders placed at this time.   Discussed case with neurosurgery - will come evaluate patient however recommends trauma admit patient for multiple fractures  Discussed case with Claiborne Billings with trauma - will evaluate CT scans and call back. Recommends deferring to neurosurgery regarding open fracture.   Neurosurgery recommends closure in the ED at this time. They will come evaluate patient.   Trauma has evaluated patient; recommends medicine admission given significant PMHx. Have also ordered a R elbow xray as pt was complaining of pain while they were in the room. They will place a consult note  Hgb has returned critical at 6.5. Pt in agreement with blood transfusion at this time; no documented religious exemptions. Attempted to contact family however was unsuccessful. Blood ordered at this time due  to medical necessity. Suspect related to trauma.   Dr. Constance Holster ENT to provide recommendations when he is done with surgery however does not think pt will require surgical repair  Family at bedside during laceration repair - in agreement with blood transfusion at this time. Will consult for admission.   U/A appears infectious at this time. Urine culture sent. Has already received cefazolin for open fracture.   Discussed case with [facility] Dr. Roosevelt Locks who agrees to accept patient for admission.   This note was prepared using Dragon voice recognition software and may include unintentional dictation errors due to the inherent limitations of voice recognition  software.   Final Clinical Impression(s) / ED Diagnoses Final diagnoses:  Fall, initial encounter  Traumatic hemorrhage of cerebrum without loss of consciousness, unspecified laterality, initial encounter ([place])  Anemia, unspecified type  Open extensive facial fractures, initial encounter [facility])    Rx / DC Orders ED Discharge Orders     None        Eustaquio Maize, PA-C 07/07/21 [place], MD 07/17/21 [phone]

## 2021-07-07 NOTE — ED Notes (Addendum)
Lab work not collected at this time as pt is not back from CT yet.

## 2021-07-07 NOTE — ED Notes (Signed)
To X-ray

## 2021-07-07 NOTE — ED Triage Notes (Addendum)
BIB EMS for fall and AMS. When EMS came pt was found sitting on the chair bleeding from his head. Blood was found on top and bottom of the stairs. Wife said that pt was altered even before the fall. Pt oriented to self only. Laceration to left upper forehead,  bleeding controlled. Follows command. Pt on blood thinner.

## 2021-07-07 NOTE — Consult Note (Addendum)
Reason for Consult: extra axial hemorrhage  Referring Physician: edp  Russell [name] is an 80 y.o. male.   HPI:  80 year old male presented to the ED today after sustaining the fall. The patient is amnestic to the event. He does not recall why he is here. States that he is on plavix but does not know why. No family at the bedside for more history. He has quite an extensive medical history. Today he complains of a severe headache and pain above his left eye. Denies any NV dizziness or vision changes. Left eye swollen shut.   Past Medical History:  Diagnosis Date   AKI (acute kidney injury) ([place]) 06/23/2017   Arthritis    SHOUDLERS, LUMBAR   BPH with obstruction/lower urinary tract symptoms    BPH with urinary obstruction 01/15/2016   COPD (chronic obstructive pulmonary disease) ([place])    Coronary atherosclerosis    denied knowledge of this 10/27/16   Depression    Diverticulosis of colon    Dyspnea    ED (erectile dysfunction)    Full dentures    GERD (gastroesophageal reflux disease)    not current   Glaucoma    History of gastritis    History of scabies    2008   History of seizure    2013-- x2   idiopathic --  none since   History of syncope    03-22-2014  DX  VAGAL RESPONSE   Hyperlipidemia, mixed    Hypertension    Mitral regurgitation    Peripheral arterial disease ([place])    one vessel runoff bilaterally via peroneal arteries   Prostate cancer ([place])    Seizures ([place]) 03/2014   ? or syncope- patient refused to be admitted for further studies- "felt better"   Type 2 diabetes, diet controlled ([place])    patient and his wife said no- have not been told 01/21/17   Wears glasses     Past Surgical History:  Procedure Laterality Date   ABDOMINAL AORTOGRAM W/LOWER EXTREMITY Left 12/18/2020   Procedure: ABDOMINAL AORTOGRAM W/LOWER EXTREMITY;  Surgeon: Cherre Robins, MD;  Location: [place] [facility];  Service: Cardiovascular;  Laterality: Left;   ABDOMINAL AORTOGRAM W/LOWER  EXTREMITY N/A 02/26/2021   Procedure: ABDOMINAL AORTOGRAM W/LOWER EXTREMITY;  Surgeon: Cherre Robins, MD;  Location: [place] [facility];  Service: Cardiovascular;  Laterality: N/A;   ANTERIOR CERVICAL DECOMP/DISCECTOMY FUSION  10-16-2009   C4 -- C6   ANTERIOR CERVICAL DECOMP/DISCECTOMY FUSION N/A 01/27/2017   Procedure: ANTERIOR CERVICAL DECOMPRESSION FUSION CERVICAL 3-4 WITH INSTRUMENTATION AND ALLOGRAFT;  Surgeon: Phylliss Bob, MD;  Location: [place];  Service: Orthopedics;  Laterality: N/A;  ANTERIOR CERVICAL DECOMPRESSION FUSION CERVICAL 3-4 WITH INSTRUMENTATION AND ALLOGRAFT   CAPSULOTOMY Right 01/26/2013   Procedure: MINOR CAPSULOTOMY;  Surgeon: Myrtha Mantis., MD;  Location: [place];  Service: Ophthalmology;  Laterality: Right;   CARPAL TUNNEL RELEASE Left 01/02/2019   Procedure: LEFT CARPAL TUNNEL RELEASE;  Surgeon: Leanora Cover, MD;  Location: [place];  Service: Orthopedics;  Laterality: Left;   CARPECTOMY Right 03/13/2014   Procedure: RIGHT  PROXIMAL ROW CARPECTOMY;  Surgeon: Tennis Must, MD;  Location: [place];  Service: Orthopedics;  Laterality: Right;   CARPECTOMY Left 10/22/2015   Procedure: LEFT WRIST PROXIMAL ROW CARPECTOMY ;  Surgeon: Leanora Cover, MD;  Location: [place];  Service: Orthopedics;  Laterality: Left;   CARPOMETACARPAL (CMC) FUSION OF THUMB Right 03/13/2014   Procedure:  RIGHT FUSION OF THUMB CARPOMETACARPAL [facility]) JOINT;  Surgeon: Tennis Must, MD;  Location: [place];  Service: Orthopedics;  Laterality: Right;   CARPOMETACARPEL SUSPENSION PLASTY Left 04/04/2020   Procedure: LEFT THUMB TRAPECIECTOMY AND SUSPENSIONPLASTY;  Surgeon: Leanora Cover, MD;  Location: [place];  Service: Orthopedics;  Laterality: Left;   CATARACT EXTRACTION W/ INTRAOCULAR LENS  IMPLANT, BILATERAL  2009   COLONOSCOPY  08-31-2007   COLONOSCOPY     CRYOABLATION N/A 01/14/2015   Procedure: CRYO ABLATION  PROSTATE;  Surgeon: Ailene Rud, MD;  Location: [facility];  Service: Urology;  Laterality: N/A;   ESOPHAGOGASTRODUODENOSCOPY  last one 09-11-2008   EXCISION MASS LEFT FACE , SUBORBITAL AREA, PLASTER RECONSTRUCTION  02-09-2011   EXCISIONAL DEBRIDEMENT AND REPAIR RIGHT QUADRICEP TENDON  07-05-2000   FOOT SURGERY Left    I & D EXTREMITY Right 05/24/2013   Procedure: RIGHT INDEX WOUND DEBRIDEMENT AND CLOSURE;  Surgeon: Jolyn Nap, MD;  Location: [place];  Service: Orthopedics;  Laterality: Right;   INSERTION OF SUPRAPUBIC CATHETER N/A 01/14/2015   Procedure: SUPRAPUBIC TUBE PLACEMENT;  Surgeon: Ailene Rud, MD;  [facility];  Service: Urology;  Laterality: N/A;   KNEE ARTHROSCOPY Right 2008   LARYNGOSCOPY AND ESOPHAGOSCOPY  06-13-2010   MARSUPIALIZATION LEFT LARGE VALLECULAR CYST   MASS EXCISION Left 10/22/2015   Procedure: EXCISION MASS OF LEFT INDEX FINGER;  Surgeon: Leanora Cover, MD;  Location: [place];  Service: Orthopedics;  Laterality: Left;   ORCHIECTOMY Left 02/24/2015   Procedure: SCROTAL EXPLORATION WITH LEFT ORCHIECTOMY;  Surgeon: Kathie Rhodes, MD;  Location: [place];  Service: Urology;  Laterality: Left;   PERIPHERAL VASCULAR INTERVENTION Left 02/26/2021   Procedure: PERIPHERAL VASCULAR INTERVENTION;  Surgeon: Cherre Robins, MD;  Location: [place] [facility];  Service: Cardiovascular;  Laterality: Left;  Superficial femoral   POSTERIOR CERVICAL FUSION/FORAMINOTOMY N/A 01/28/2017   Procedure: POSTERIOR SPINAL FUSION CERVICAL 3-4, CERVICAL 4-5, CERVICAL 5-6, CERVICAL 6-7 WITH INSTRUMENATION AND ALLOGRAFT;  Surgeon: Phylliss Bob, MD;  Location: [place];  Service: Orthopedics;  Laterality: N/A;  POSTERIOR SPINAL FUSION CERVICAL 3-4, CERVICAL 4-5, CERVICAL 5-6, CERVICAL 6-7 WITH INSTRUMENATION AND ALLOGRAFT   SHOULDER ARTHROSCOPY/ DEBRIDEMENT LABRAL TEAR/  BICEPS TENOTOMY Left 09-24-2011   TONSILLECTOMY   as child   TRANSTHORACIC ECHOCARDIOGRAM  02-21-2010   normal LVF/  ef 55-60%/ mild to moderate MR/  moderate LAE/  mild TR   YAG LASER APPLICATION Right 04/25/2129   Procedure: YAG LASER APPLICATION;  Surgeon: Myrtha Mantis., MD;  [facility];  Service: Ophthalmology;  Laterality: Right;   YAG LASER CAPSULOTOMY, LEFT EYE  01-08-2011    Allergies  Allergen Reactions   Tramadol     Confusion (intolerance)    Social History   Tobacco Use   Smoking status: Every Day    Packs/day: 1.00    Years: 59.00    Pack years: 59.00    Types: Cigarettes   Smokeless tobacco: Never   Tobacco comments:    Started Chantix 10/26/16  Substance Use Topics   Alcohol use: No    Comment: none since approx 2014- heavy before    Family History  Problem Relation Age of Onset   Unexplained death Mother        died at a young age, no known cause   Heart attack Father        age unknown   Diabetes Other  Cancer Other      Review of Systems  Positive ROS: as above  All other systems have been reviewed and were otherwise negative with the exception of those mentioned in the HPI and as above.  Objective: Vital signs in last 24 hours: Temp:  [97.6 F (36.4 C)] 97.6 F (36.4 C) (07/18 1021) Pulse Rate:  [67-76] 76 (07/18 1230) Resp:  [13-21] 18 (07/18 1230) BP: (137-160)/(60-74) 137/67 (07/18 1230) SpO2:  [92 %-100 %] 100 % (07/18 1230)  General Appearance: Alert, cooperative, no distress, appears stated age Head: Normocephalic, without obvious abnormality, large laceration above left eyebrow Eyes: PERRL, conjunctiva/corneas clear, EOM's intact, fundi benign, both eyes , left eye swollen shut.     Lungs: respirations unlabored Heart: Regular rate and rhythm Extremities: Extremities normal, atraumatic, no cyanosis or edema Pulses: 2+ and symmetric all extremities Skin: Skin color, texture, turgor normal, no rashes or lesions  NEUROLOGIC:   Mental status: A&O x2, no aphasia,  good attention span, Memory and fund of knowledge impaired Motor Exam - grossly normal, normal tone and bulk Sensory Exam - grossly normal Reflexes: symmetric, no pathologic reflexes, No Hoffman's, No clonus Coordination - not tested Gait - not tested Balance - not tested Cranial Nerves: I: smell Not tested  II: visual acuity  OS: na    OD: na  II: visual fields Full to confrontation  II: pupils Equal, round, reactive to light  III,VII: ptosis None  III,IV,VI: extraocular muscles  Full ROM  V: mastication Normal  V: facial light touch sensation  Normal  V,VII: corneal reflex  Present  VII: facial muscle function - upper  Normal  VII: facial muscle function - lower Normal  VIII: hearing Not tested  IX: soft palate elevation  Normal  IX,X: gag reflex Present  XI: trapezius strength  5/5  XI: sternocleidomastoid strength 5/5  XI: neck flexion strength  5/5  XII: tongue strength  Normal    Data Review Lab Results  Component Value Date   WBC 9.2 01/14/2019   HGB 7.1 (L) 07/07/2021   HCT 21.0 (L) 07/07/2021   MCV 80.6 01/14/2019   PLT 331 01/14/2019   Lab Results  Component Value Date   NA 139 07/07/2021   K 4.3 07/07/2021   CL 109 07/07/2021   CO2 24 04/01/2020   BUN 30 (H) 07/07/2021   CREATININE 2.10 (H) 07/07/2021   GLUCOSE 114 (H) 07/07/2021   Lab Results  Component Value Date   INR 1.00 01/21/2017    Radiology: CT Head Wo Contrast  Result Date: 07/07/2021 CLINICAL DATA:  [place] today striking head, level 2 fall, on blood thinners, LEFT frontal laceration, uncertain loss of consciousness EXAM: CT HEAD WITHOUT CONTRAST CT MAXILLOFACIAL WITHOUT CONTRAST CT CERVICAL SPINE WITHOUT CONTRAST TECHNIQUE: Multidetector CT imaging of the head, cervical spine, and maxillofacial structures were performed using the standard protocol without intravenous contrast. Multiplanar CT image reconstructions of the cervical spine and maxillofacial structures were also generated.  COMPARISON:  CT head 11/23/2010 FINDINGS: CT HEAD FINDINGS Brain: Generalized atrophy. Normal ventricular morphology. No midline shift or mass effect. Dural calcifications in falx. Scattered high attenuation acute hemorrhage identified extra-axial at anterior aspect of interhemispheric fissure, in BILATERAL frontal regions, and at anterior aspect of LEFT middle cranial fossa consistent with acute intracranial hemorrhage. Small vessel chronic ischemic changes of deep cerebral white matter. No intraparenchymal hemorrhage or infarction. No mass. Vascular: No hyperdense vessels. Atherosclerotic calcifications of internal carotid arteries at skull base Skull: Nondisplaced LEFT frontal  bone fractures. Other: N/A CT MAXILLOFACIAL FINDINGS Osseous: TMJ alignment normal bilaterally. Nondisplaced fractures involving LEFT zygomatic arch, lateral and superolateral walls of the LEFT orbit, and LEFT frontal bone. LEFT frontal bone fracture extends obliquely to involve the LEFT frontal sinus. Fracture at medial wall LEFT orbit extending into base of LEFT nasal bone. Questionable tiny fracture at anterior LEFT maxilla. Orbits: Intraorbital soft tissue planes clear. LEFT periorbital contusion and soft tissue swelling. Sinuses: Fluid air-fluid levels within the LEFT frontal, sphenoid, and BILATERAL maxillary sinuses. Middle ear cavities and mastoid air cells clear Soft tissues: Foci of soft tissue gas LEFT periorbital and at LEFT infratemporal fossa. CT CERVICAL SPINE FINDINGS Alignment: Normal Skull base and vertebrae: Prior anterior fusion C3-C4. Interval removal of prior anterior C4-C6 plate with intact bony fusion. Interval posterior fusion C3-C7. Disc space narrowing with endplate spur formation at C6-C7. Vertebral body heights maintained without acute fracture or subluxation. Scattered facet degenerative changes. Skull base intact. Soft tissues and spinal canal: Prevertebral soft tissues normal thickness. Atherosclerotic  calcifications at carotid bifurcations bilaterally and at proximal great vessels. 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 Scattered acute extra-axial hemorrhage identified at anterior, interhemispheric, BILATERAL frontal, and anterior LEFT middle cranial fossa. No additional acute intracranial abnormalities. Nondisplaced LEFT frontal bone fractures. Nondisplaced fractures involving LEFT zygomatic arch, lateral and superolateral walls of the LEFT orbit, and LEFT frontal bone. Fracture at medial wall LEFT orbit extending into base of LEFT nasal bone. Questionable tiny transverse fracture at anterior LEFT maxilla. Degenerative disc and facet disease changes of the cervical spine. Prior anterior and posterior fusion changes of the cervical spine as above. No acute cervical spine abnormalities. Critical Value/emergent results were called by telephone at the time of interpretation on 07/07/2021 at 1145 hrs to provider [facility] VENTER PA, who verbally acknowledged these results. Electronically Signed   By: Lavonia Dana M.D.   On: 07/07/2021 11:47   CT Cervical Spine Wo Contrast  Result Date: 07/07/2021 CLINICAL DATA:  [place] today striking head, level 2 fall, on blood thinners, LEFT frontal laceration, uncertain loss of consciousness EXAM: CT HEAD WITHOUT CONTRAST CT MAXILLOFACIAL WITHOUT CONTRAST CT CERVICAL SPINE WITHOUT CONTRAST TECHNIQUE: Multidetector CT imaging of the head, cervical spine, and maxillofacial structures were performed using the standard protocol without intravenous contrast. Multiplanar CT image reconstructions of the cervical spine and maxillofacial structures were also generated. COMPARISON:  CT head 11/23/2010 FINDINGS: CT HEAD FINDINGS Brain: Generalized atrophy. Normal ventricular morphology. No midline shift or mass effect. Dural calcifications  in falx. Scattered high attenuation acute hemorrhage identified extra-axial at anterior aspect of interhemispheric fissure, in BILATERAL frontal regions, and at anterior aspect of LEFT middle cranial fossa consistent with acute intracranial hemorrhage. Small vessel chronic ischemic changes of deep cerebral white matter. No intraparenchymal hemorrhage or infarction. No mass. Vascular: No hyperdense vessels. Atherosclerotic calcifications of internal carotid arteries at skull base Skull: Nondisplaced LEFT frontal bone fractures. Other: N/A CT MAXILLOFACIAL FINDINGS Osseous: TMJ alignment normal bilaterally. Nondisplaced fractures involving LEFT zygomatic arch, lateral and superolateral walls of the LEFT orbit, and LEFT frontal bone. LEFT frontal bone fracture extends obliquely to involve the LEFT frontal sinus. Fracture at medial wall LEFT orbit extending into base of LEFT nasal bone. Questionable tiny fracture at anterior LEFT maxilla. Orbits: Intraorbital soft tissue planes clear. LEFT periorbital contusion and soft tissue swelling. Sinuses: Fluid air-fluid levels within the LEFT  frontal, sphenoid, and BILATERAL maxillary sinuses. Middle ear cavities and mastoid air cells clear Soft tissues: Foci of soft tissue gas LEFT periorbital and at LEFT infratemporal fossa. CT CERVICAL SPINE FINDINGS Alignment: Normal Skull base and vertebrae: Prior anterior fusion C3-C4. Interval removal of prior anterior C4-C6 plate with intact bony fusion. Interval posterior fusion C3-C7. Disc space narrowing with endplate spur formation at C6-C7. Vertebral body heights maintained without acute fracture or subluxation. Scattered facet degenerative changes. Skull base intact. Soft tissues and spinal canal: Prevertebral soft tissues normal thickness. Atherosclerotic calcifications at carotid bifurcations bilaterally and at proximal great vessels. Disc levels:  No definite abnormalities Upper chest: Emphysematous changes at lung apices Other:  N/A IMPRESSION: Atrophy with small vessel chronic ischemic changes of deep cerebral white matter. Scattered acute extra-axial hemorrhage identified at anterior, interhemispheric, BILATERAL frontal, and anterior LEFT middle cranial fossa. No additional acute intracranial abnormalities. Nondisplaced LEFT frontal bone fractures. Nondisplaced fractures involving LEFT zygomatic arch, lateral and superolateral walls of the LEFT orbit, and LEFT frontal bone. Fracture at medial wall LEFT orbit extending into base of LEFT nasal bone. Questionable tiny transverse fracture at anterior LEFT maxilla. Degenerative disc and facet disease changes of the cervical spine. Prior anterior and posterior fusion changes of the cervical spine as above. No acute cervical spine abnormalities. Critical Value/emergent results were called by telephone at the time of interpretation on 07/07/2021 at 1145 hrs to provider [facility] VENTER PA, who verbally acknowledged these results. Electronically Signed   By: Lavonia Dana M.D.   On: 07/07/2021 11:47   DG HIP UNILAT WITH PELVIS 2-3 VIEWS RIGHT  Result Date: 07/07/2021 CLINICAL DATA:  Right hip pain after fall today. EXAM: DG HIP (WITH OR WITHOUT PELVIS) 2-3V RIGHT COMPARISON:  None. FINDINGS: There is no evidence of hip fracture or dislocation. There is no evidence of arthropathy or other focal bone abnormality. IMPRESSION: Negative. Electronically Signed   By: Marijo Conception M.D.   On: 07/07/2021 11:43   CT Maxillofacial WO CM  Result Date: 07/07/2021 CLINICAL DATA:  [place] today striking head, level 2 fall, on blood thinners, LEFT frontal laceration, uncertain loss of consciousness EXAM: CT HEAD WITHOUT CONTRAST CT MAXILLOFACIAL WITHOUT CONTRAST CT CERVICAL SPINE WITHOUT CONTRAST TECHNIQUE: Multidetector CT imaging of the head, cervical spine, and maxillofacial structures were performed using the standard protocol without intravenous contrast. Multiplanar CT image reconstructions of the  cervical spine and maxillofacial structures were also generated. COMPARISON:  CT head 11/23/2010 FINDINGS: CT HEAD FINDINGS Brain: Generalized atrophy. Normal ventricular morphology. No midline shift or mass effect. Dural calcifications in falx. Scattered high attenuation acute hemorrhage identified extra-axial at anterior aspect of interhemispheric fissure, in BILATERAL frontal regions, and at anterior aspect of LEFT middle cranial fossa consistent with acute intracranial hemorrhage. Small vessel chronic ischemic changes of deep cerebral white matter. No intraparenchymal hemorrhage or infarction. No mass. Vascular: No hyperdense vessels. Atherosclerotic calcifications of internal carotid arteries at skull base Skull: Nondisplaced LEFT frontal bone fractures. Other: N/A CT MAXILLOFACIAL FINDINGS Osseous: TMJ alignment normal bilaterally. Nondisplaced fractures involving LEFT zygomatic arch, lateral and superolateral walls of the LEFT orbit, and LEFT frontal bone. LEFT frontal bone fracture extends obliquely to involve the LEFT frontal sinus. Fracture at medial wall LEFT orbit extending into base of LEFT nasal bone. Questionable tiny fracture at anterior LEFT maxilla. Orbits: Intraorbital soft tissue planes clear. LEFT periorbital contusion and soft tissue swelling. Sinuses: Fluid air-fluid levels within the LEFT frontal, sphenoid, and BILATERAL maxillary sinuses. Middle ear cavities and  mastoid air cells clear Soft tissues: Foci of soft tissue gas LEFT periorbital and at LEFT infratemporal fossa. CT CERVICAL SPINE FINDINGS Alignment: Normal Skull base and vertebrae: Prior anterior fusion C3-C4. Interval removal of prior anterior C4-C6 plate with intact bony fusion. Interval posterior fusion C3-C7. Disc space narrowing with endplate spur formation at C6-C7. Vertebral body heights maintained without acute fracture or subluxation. Scattered facet degenerative changes. Skull base intact. Soft tissues and spinal canal:  Prevertebral soft tissues normal thickness. Atherosclerotic calcifications at carotid bifurcations bilaterally and at proximal great vessels. 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 Scattered acute extra-axial hemorrhage identified at anterior, interhemispheric, BILATERAL frontal, and anterior LEFT middle cranial fossa. No additional acute intracranial abnormalities. Nondisplaced LEFT frontal bone fractures. Nondisplaced fractures involving LEFT zygomatic arch, lateral and superolateral walls of the LEFT orbit, and LEFT frontal bone. Fracture at medial wall LEFT orbit extending into base of LEFT nasal bone. Questionable tiny transverse fracture at anterior LEFT maxilla. Degenerative disc and facet disease changes of the cervical spine. Prior anterior and posterior fusion changes of the cervical spine as above. No acute cervical spine abnormalities. Critical Value/emergent results were called by telephone at the time of interpretation on 07/07/2021 at 1145 hrs to provider [facility] VENTER PA, who verbally acknowledged these results. Electronically Signed   By: Lavonia Dana M.D.   On: 07/07/2021 11:47     Assessment/Plan: 80 year old male presented to the hospital today after sustaining a fall. He is on plavix. CT head showed several small scattered extra-axial hemorrhages- anterior, interhemispheric, bilateral frontal and anterior left middle cranial foss. No mass effect or midline shift. He also sustained several facial fractures. No surgical intervention is warranted at this point. Would recommend follow up head CT in the morning. Hold all blood thinners for now. ENT for facial fractures.   Ocie Cornfield Arkeem Harts 07/07/2021 12:59 PM

## 2021-07-07 NOTE — ED Notes (Signed)
Provider at bedside

## 2021-07-07 NOTE — H&P (Addendum)
History and Physical    [name]:[id number] DOB: 11-20-1941 DOA: 07/07/2021  PCP: Marcie Mowers, FNP (Confirm with patient/family/NH records and if not entered, this has to be entered at [facility] point of entry) Patient coming from: Home  I have personally briefly reviewed patient's old medical records in [place]  Chief Complaint: I fell and hit my head.  HPI: Russell [name] is a 80 y.o. male with medical history significant of poorly controlled BPH, PVD on aspirin and Plavix, HTN, CKD stage II, peripheral neuropathy on gabapentin, chronic diastolic CHF, chronic iron deficiency anemia, presented with fall and multiple skin tearing, bleeding anemia and intracranial bleed.  Patient developed urinary frequency for last 3 days, denied any dysuria or burning sensations.  No fever or chills.  He also feels malaise and weakness for last 2 days.  Last night he woke up to go to bathroom then collapsed " my legs gave out" he hit his head on the stairs.  He does not remember whether he lost consciousness or not.  He was trying to stand up again but feeling so weak then he stayed in chair through the night.  This morning, wife woke him and found.  Blood on the stairs and saw the patient in the chair confused and called 911.  According to ED physician's interview with patient and wife, wife reported patient was confused last night.  Currently, patient complaining about feeling lightheaded, feeling pain on the multiple wounds on his head, denied any trouble breathing, no blurry vision.  ED Course: Had positive for small scattered extra-axetil hemorrhages, anterior, interhemispheric, bilateral frontal and anterior left middle cranial fossa, neurology consulted and recommend hold aspirin and Plavix and repeat CT head tomorrow.  CT head also showed multiple fractures involving her left skull, zygomatic arch, left orbital left frontal bone, left maxillary and nasal bone.  ENT consulted, who  recommended no acute intervention needed follow-up as outpatient.  UA compatible with UTI.  Review of Systems: As per HPI otherwise 14 point review of systems negative.    Past Medical History:  Diagnosis Date   AKI (acute kidney injury) ([place]) 06/23/2017   Arthritis    SHOUDLERS, LUMBAR   BPH with obstruction/lower urinary tract symptoms    BPH with urinary obstruction 01/15/2016   COPD (chronic obstructive pulmonary disease) ([place])    Coronary atherosclerosis    denied knowledge of this 10/27/16   Depression    Diverticulosis of colon    Dyspnea    ED (erectile dysfunction)    Full dentures    GERD (gastroesophageal reflux disease)    not current   Glaucoma    History of gastritis    History of scabies    2008   History of seizure    2013-- x2   idiopathic --  none since   History of syncope    03-22-2014  DX  VAGAL RESPONSE   Hyperlipidemia, mixed    Hypertension    Mitral regurgitation    Peripheral arterial disease ([place])    one vessel runoff bilaterally via peroneal arteries   Prostate cancer ([place])    Seizures ([place]) 03/2014   ? or syncope- patient refused to be admitted for further studies- "felt better"   Type 2 diabetes, diet controlled ([place])    patient and his wife said no- have not been told 01/21/17   Wears glasses     Past Surgical History:  Procedure Laterality Date   ABDOMINAL AORTOGRAM W/LOWER EXTREMITY  Left 12/18/2020   Procedure: ABDOMINAL AORTOGRAM W/LOWER EXTREMITY;  Surgeon: Cherre Robins, MD;  Location: [place] [facility];  Service: Cardiovascular;  Laterality: Left;   ABDOMINAL AORTOGRAM W/LOWER EXTREMITY N/A 02/26/2021   Procedure: ABDOMINAL AORTOGRAM W/LOWER EXTREMITY;  Surgeon: Cherre Robins, MD;  Location: [place] [facility];  Service: Cardiovascular;  Laterality: N/A;   ANTERIOR CERVICAL DECOMP/DISCECTOMY FUSION  10-16-2009   C4 -- C6   ANTERIOR CERVICAL DECOMP/DISCECTOMY FUSION N/A 01/27/2017   Procedure: ANTERIOR CERVICAL DECOMPRESSION  FUSION CERVICAL 3-4 WITH INSTRUMENTATION AND ALLOGRAFT;  Surgeon: Phylliss Bob, MD;  Location: [place];  Service: Orthopedics;  Laterality: N/A;  ANTERIOR CERVICAL DECOMPRESSION FUSION CERVICAL 3-4 WITH INSTRUMENTATION AND ALLOGRAFT   CAPSULOTOMY Right 01/26/2013   Procedure: MINOR CAPSULOTOMY;  Surgeon: Myrtha Mantis., MD;  Location: [place];  Service: Ophthalmology;  Laterality: Right;   CARPAL TUNNEL RELEASE Left 01/02/2019   Procedure: LEFT CARPAL TUNNEL RELEASE;  Surgeon: Leanora Cover, MD;  Location: [place];  Service: Orthopedics;  Laterality: Left;   CARPECTOMY Right 03/13/2014   Procedure: RIGHT  PROXIMAL ROW CARPECTOMY;  Surgeon: Tennis Must, MD;  Location: [place];  Service: Orthopedics;  Laterality: Right;   CARPECTOMY Left 10/22/2015   Procedure: LEFT WRIST PROXIMAL ROW CARPECTOMY ;  Surgeon: Leanora Cover, MD;  Location: [place];  Service: Orthopedics;  Laterality: Left;   CARPOMETACARPAL ([place]) FUSION OF THUMB Right 03/13/2014   Procedure: RIGHT FUSION OF THUMB CARPOMETACARPAL [facility]) JOINT;  Surgeon: Tennis Must, MD;  Location: [place];  Service: Orthopedics;  Laterality: Right;   CARPOMETACARPEL SUSPENSION PLASTY Left 04/04/2020   Procedure: LEFT THUMB TRAPECIECTOMY AND SUSPENSIONPLASTY;  Surgeon: Leanora Cover, MD;  Location: [place];  Service: Orthopedics;  Laterality: Left;   CATARACT EXTRACTION W/ INTRAOCULAR LENS  IMPLANT, BILATERAL  2009   COLONOSCOPY  08-31-2007   COLONOSCOPY     CRYOABLATION N/A 01/14/2015   Procedure: CRYO ABLATION PROSTATE;  Surgeon: Ailene Rud, MD;  Location: [facility];  Service: Urology;  Laterality: N/A;   ESOPHAGOGASTRODUODENOSCOPY  last one 09-11-2008   EXCISION MASS LEFT FACE , SUBORBITAL AREA, PLASTER RECONSTRUCTION  02-09-2011   EXCISIONAL DEBRIDEMENT AND REPAIR RIGHT QUADRICEP TENDON  07-05-2000   FOOT SURGERY Left    I & D  EXTREMITY Right 05/24/2013   Procedure: RIGHT INDEX WOUND DEBRIDEMENT AND CLOSURE;  Surgeon: Jolyn Nap, MD;  Location: [place];  Service: Orthopedics;  Laterality: Right;   INSERTION OF SUPRAPUBIC CATHETER N/A 01/14/2015   Procedure: SUPRAPUBIC TUBE PLACEMENT;  Surgeon: Ailene Rud, MD;  Location: [facility];  Service: Urology;  Laterality: N/A;   KNEE ARTHROSCOPY Right 2008   LARYNGOSCOPY AND ESOPHAGOSCOPY  06-13-2010   MARSUPIALIZATION LEFT LARGE VALLECULAR CYST   MASS EXCISION Left 10/22/2015   Procedure: EXCISION MASS OF LEFT INDEX FINGER;  Surgeon: Leanora Cover, MD;  Location: [place];  Service: Orthopedics;  Laterality: Left;   ORCHIECTOMY Left 02/24/2015   Procedure: SCROTAL EXPLORATION WITH LEFT ORCHIECTOMY;  Surgeon: Kathie Rhodes, MD;  Location: [place];  Service: Urology;  Laterality: Left;   PERIPHERAL VASCULAR INTERVENTION Left 02/26/2021   Procedure: PERIPHERAL VASCULAR INTERVENTION;  Surgeon: Cherre Robins, MD;  Location: [place] [facility];  Service: Cardiovascular;  Laterality: Left;  Superficial femoral   POSTERIOR CERVICAL FUSION/FORAMINOTOMY N/A 01/28/2017   Procedure: POSTERIOR SPINAL FUSION CERVICAL 3-4, CERVICAL 4-5, CERVICAL 5-6, CERVICAL  6-7 WITH INSTRUMENATION AND ALLOGRAFT;  Surgeon: Phylliss Bob, MD;  Location: [place];  Service: Orthopedics;  Laterality: N/A;  POSTERIOR SPINAL FUSION CERVICAL 3-4, CERVICAL 4-5, CERVICAL 5-6, CERVICAL 6-7 WITH INSTRUMENATION AND ALLOGRAFT   SHOULDER ARTHROSCOPY/ DEBRIDEMENT LABRAL TEAR/  BICEPS TENOTOMY Left 09-24-2011   TONSILLECTOMY  as child   TRANSTHORACIC ECHOCARDIOGRAM  02-21-2010   normal LVF/  ef 55-60%/ mild to moderate MR/  moderate LAE/  mild TR   YAG LASER APPLICATION Right 07/25/2777   Procedure: YAG LASER APPLICATION;  Surgeon: Myrtha Mantis., MD;  [facility];  Service: Ophthalmology;  Laterality: Right;   YAG LASER CAPSULOTOMY, LEFT EYE  01-08-2011      reports that he has been smoking cigarettes. He has a 59.00 pack-year smoking history. He has never used smokeless tobacco. He reports that he does not drink alcohol and does not use drugs.  Allergies  Allergen Reactions   Tramadol     Confusion (intolerance)    Family History  Problem Relation Age of Onset   Unexplained death Mother        died at a young age, no known cause   Heart attack Father        age unknown   Diabetes Other    Cancer Other      Prior to Admission medications   Medication Sig Start Date End Date Taking? Authorizing Provider  amitriptyline (ELAVIL) 25 MG tablet Take 25 mg by mouth at bedtime. 07/22/20   [provider]  aspirin EC 81 MG tablet Take 1 tablet (81 mg total) by mouth daily. Swallow whole. 12/18/20 12/18/21  Cherre Robins, MD  AZOPT 1 % ophthalmic suspension Place 1 drop into both eyes 3 (three) times daily. 12/25/16   [provider]  bimatoprost (LUMIGAN) 0.01 % SOLN Place 1 drop into both eyes at bedtime. 04/10/13   Dhungel, Nishant, MD  brimonidine (ALPHAGAN) 0.2 % ophthalmic solution Place 1 drop into both eyes in the morning and at bedtime. 03/14/19   [provider]  clopidogrel (PLAVIX) 75 MG tablet Take 1 tablet (75 mg total) by mouth daily. 02/26/21 02/26/22  Cherre Robins, MD  clopidogrel (PLAVIX) 75 MG tablet TAKE 1 TABLET (75 MG TOTAL) BY MOUTH DAILY. 02/26/21 02/26/22  Cherre Robins, MD  esomeprazole (NEXIUM) 40 MG capsule Take 1 capsule (40 mg total) by mouth daily. Patient taking differently: Take 40 mg by mouth daily as needed (acid reflux/indigestion.). 07/16/17   Isaiah Serge, NP  Ferrous Sulfate ([employer] SLOW RELEASE IRON) 143 (45 Fe) MG TBCR Take 45 mg by mouth daily.    [provider]  gabapentin (NEURONTIN) 300 MG capsule Take 300 mg by mouth daily. 02/25/21   [provider]  losartan (COZAAR) 100 MG tablet Take 100 mg by mouth daily. 06/08/17   [provider]  meclizine  (ANTIVERT) 25 MG tablet Take 25 mg by mouth daily as needed for dizziness. 06/16/17   [provider]  Multiple Vitamin (MULTIVITAMIN WITH MINERALS) TABS tablet Take 1 tablet by mouth daily.    [provider]  MYRBETRIQ 25 MG TB24 tablet Take 25 mg by mouth daily. 02/12/20   [provider]  Omega-3 Fatty Acids (FISH OIL) 1000 MG CPDR Take 1,000 mg by mouth daily.    [provider]  oxybutynin (DITROPAN) 5 MG tablet Take 5 mg by mouth every morning. 11/13/19   [provider]  pravastatin (PRAVACHOL) 80 MG tablet Take 80  mg by mouth daily.  10/19/16   [provider]  tamsulosin (FLOMAX) 0.4 MG CAPS capsule Take 0.4 mg by mouth at bedtime.  12/03/16   [provider]    Physical Exam: Vitals:   07/07/21 1717 07/07/21 1800 07/07/21 1818 07/07/21 1839  BP: (!) 143/77 (!) 147/63 (!) 142/60 137/64  Pulse: 73 71 71 74  Resp: 15 18 17 18   Temp: 99.3 F (37.4 C)  99.3 F (37.4 C) 99.3 F (37.4 C)  TempSrc: Oral  Oral Oral  SpO2: 100% 98% 100% 100%    Constitutional: NAD, calm, comfortable Vitals:   07/07/21 1717 07/07/21 1800 07/07/21 1818 07/07/21 1839  BP: (!) 143/77 (!) 147/63 (!) 142/60 137/64  Pulse: 73 71 71 74  Resp: 15 18 17 18   Temp: 99.3 F (37.4 C)  99.3 F (37.4 C) 99.3 F (37.4 C)  TempSrc: Oral  Oral Oral  SpO2: 100% 98% 100% 100%   Eyes: PERRL, lids and conjunctivae normal ENMT: Mucous membranes are moist. Posterior pharynx clear of any exudate or lesions.Normal dentition.  Neck: normal, supple, no masses, no thyromegaly Respiratory: clear to auscultation bilaterally, no wheezing, no crackles. Normal respiratory effort. No accessory muscle use.  Cardiovascular: Regular rate and rhythm, no murmurs / rubs / gallops. No extremity edema. 2+ pedal pulses. No carotid bruits.  Abdomen: no tenderness, no masses palpated. No hepatosplenomegaly. Bowel sounds positive.  Musculoskeletal: no clubbing / cyanosis. No  joint deformity upper and lower extremities. Good ROM, no contractures. Normal muscle tone.  Skin: no rashes, lesions, ulcers. No induration Neurologic: CN 2-12 grossly intact. Sensation intact, DTR normal. Strength 5/5 in all 4.  Psychiatric: Normal judgment and insight. Alert and oriented x 3. Normal mood.     Labs on Admission: I have personally reviewed following labs and imaging studies  CBC: Recent Labs  Lab 07/07/21 1153 07/07/21 1205  WBC 13.9*  --   NEUTROABS 12.1*  --   HGB 6.5* 7.1*  HCT 22.5* 21.0*  MCV 78.9*  --   PLT PLATELET CLUMPS NOTED ON SMEAR, UNABLE TO ESTIMATE  --    Basic Metabolic Panel: Recent Labs  Lab 07/07/21 1153 07/07/21 1205  NA 138 139  K 4.3 4.3  CL 105 109  CO2 22  --   GLUCOSE 120* 114*  BUN 28* 30*  CREATININE 1.97* 2.10*  CALCIUM 9.3  --    GFR: CrCl cannot be calculated (Unknown ideal weight.). Liver Function Tests: No results for input(s): AST, ALT, ALKPHOS, BILITOT, PROT, ALBUMIN in the last 168 hours. No results for input(s): LIPASE, AMYLASE in the last 168 hours. No results for input(s): AMMONIA in the last 168 hours. Coagulation Profile: No results for input(s): INR, PROTIME in the last 168 hours. Cardiac Enzymes: No results for input(s): CKTOTAL, CKMB, CKMBINDEX, TROPONINI in the last 168 hours. BNP (last 3 results) No results for input(s): PROBNP in the last 8760 hours. HbA1C: No results for input(s): HGBA1C in the last 72 hours. CBG: Recent Labs  Lab 07/07/21 1021  GLUCAP 119*   Lipid Profile: No results for input(s): CHOL, HDL, LDLCALC, TRIG, CHOLHDL, LDLDIRECT in the last 72 hours. Thyroid Function Tests: No results for input(s): TSH, T4TOTAL, FREET4, T3FREE, THYROIDAB in the last 72 hours. Anemia Panel: No results for input(s): VITAMINB12, FOLATE, FERRITIN, TIBC, IRON, RETICCTPCT in the last 72 hours. Urine analysis:    Component Value Date/Time   COLORURINE YELLOW 07/07/2021 [place]  07/07/2021 1041   LABSPEC  1.010 07/07/2021 1041   PHURINE 5.0 07/07/2021 1041   GLUCOSEU NEGATIVE 07/07/2021 1041   HGBUR SMALL (A) 07/07/2021 [place] 07/07/2021 1041   [place] 07/07/2021 1041   PROTEINUR NEGATIVE 07/07/2021 1041   UROBILINOGEN 1.0 02/22/2015 0905   NITRITE NEGATIVE 07/07/2021 1041   LEUKOCYTESUR LARGE (A) 07/07/2021 1041    Radiological Exams on Admission: DG ELBOW COMPLETE RIGHT (3+VIEW)  Result Date: 07/07/2021 CLINICAL DATA:  Fall and altered mental status EXAM: RIGHT ELBOW - COMPLETE 3+ VIEW COMPARISON:  None. FINDINGS: There is no evidence of acute fracture. The elbow is held in flexion resulting in a suboptimal lateral view, however there is no evidence of displaced posterior fat pad to suggest a joint effusion. There is mild ulnar trochlear joint degenerative change. IMPRESSION: No evidence of acute fracture. Electronically Signed   By: Maurine Simmering   On: 07/07/2021 13:58   CT Head Wo Contrast  Result Date: 07/07/2021 CLINICAL DATA:  [place] today striking head, level 2 fall, on blood thinners, LEFT frontal laceration, uncertain loss of consciousness EXAM: CT HEAD WITHOUT CONTRAST CT MAXILLOFACIAL WITHOUT CONTRAST CT CERVICAL SPINE WITHOUT CONTRAST TECHNIQUE: Multidetector CT imaging of the head, cervical spine, and maxillofacial structures were performed using the standard protocol without intravenous contrast. Multiplanar CT image reconstructions of the cervical spine and maxillofacial structures were also generated. COMPARISON:  CT head 11/23/2010 FINDINGS: CT HEAD FINDINGS Brain: Generalized atrophy. Normal ventricular morphology. No midline shift or mass effect. Dural calcifications in falx. Scattered high attenuation acute hemorrhage identified extra-axial at anterior aspect of interhemispheric fissure, in BILATERAL frontal regions, and at anterior aspect of LEFT middle cranial fossa consistent with acute intracranial hemorrhage. Small  vessel chronic ischemic changes of deep cerebral white matter. No intraparenchymal hemorrhage or infarction. No mass. Vascular: No hyperdense vessels. Atherosclerotic calcifications of internal carotid arteries at skull base Skull: Nondisplaced LEFT frontal bone fractures. Other: N/A CT MAXILLOFACIAL FINDINGS Osseous: TMJ alignment normal bilaterally. Nondisplaced fractures involving LEFT zygomatic arch, lateral and superolateral walls of the LEFT orbit, and LEFT frontal bone. LEFT frontal bone fracture extends obliquely to involve the LEFT frontal sinus. Fracture at medial wall LEFT orbit extending into base of LEFT nasal bone. Questionable tiny fracture at anterior LEFT maxilla. Orbits: Intraorbital soft tissue planes clear. LEFT periorbital contusion and soft tissue swelling. Sinuses: Fluid air-fluid levels within the LEFT frontal, sphenoid, and BILATERAL maxillary sinuses. Middle ear cavities and mastoid air cells clear Soft tissues: Foci of soft tissue gas LEFT periorbital and at LEFT infratemporal fossa. CT CERVICAL SPINE FINDINGS Alignment: Normal Skull base and vertebrae: Prior anterior fusion C3-C4. Interval removal of prior anterior C4-C6 plate with intact bony fusion. Interval posterior fusion C3-C7. Disc space narrowing with endplate spur formation at C6-C7. Vertebral body heights maintained without acute fracture or subluxation. Scattered facet degenerative changes. Skull base intact. Soft tissues and spinal canal: Prevertebral soft tissues normal thickness. Atherosclerotic calcifications at carotid bifurcations bilaterally and at proximal great vessels. 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 Scattered acute extra-axial hemorrhage identified at anterior, interhemispheric, BILATERAL frontal, and anterior LEFT middle cranial fossa. No additional acute  intracranial abnormalities. Nondisplaced LEFT frontal bone fractures. Nondisplaced fractures involving LEFT zygomatic arch, lateral and superolateral walls of the LEFT orbit, and LEFT frontal bone. Fracture at medial wall LEFT orbit extending into base of LEFT nasal bone. Questionable tiny transverse fracture  at anterior LEFT maxilla. Degenerative disc and facet disease changes of the cervical spine. Prior anterior and posterior fusion changes of the cervical spine as above. No acute cervical spine abnormalities. Critical Value/emergent results were called by telephone at the time of interpretation on 07/07/2021 at 1145 hrs to provider [facility] VENTER PA, who verbally acknowledged these results. Electronically Signed   By: Lavonia Dana M.D.   On: 07/07/2021 11:47   CT Cervical Spine Wo Contrast  Result Date: 07/07/2021 CLINICAL DATA:  [place] today striking head, level 2 fall, on blood thinners, LEFT frontal laceration, uncertain loss of consciousness EXAM: CT HEAD WITHOUT CONTRAST CT MAXILLOFACIAL WITHOUT CONTRAST CT CERVICAL SPINE WITHOUT CONTRAST TECHNIQUE: Multidetector CT imaging of the head, cervical spine, and maxillofacial structures were performed using the standard protocol without intravenous contrast. Multiplanar CT image reconstructions of the cervical spine and maxillofacial structures were also generated. COMPARISON:  CT head 11/23/2010 FINDINGS: CT HEAD FINDINGS Brain: Generalized atrophy. Normal ventricular morphology. No midline shift or mass effect. Dural calcifications in falx. Scattered high attenuation acute hemorrhage identified extra-axial at anterior aspect of interhemispheric fissure, in BILATERAL frontal regions, and at anterior aspect of LEFT middle cranial fossa consistent with acute intracranial hemorrhage. Small vessel chronic ischemic changes of deep cerebral white matter. No intraparenchymal hemorrhage or infarction. No mass. Vascular: No hyperdense vessels. Atherosclerotic  calcifications of internal carotid arteries at skull base Skull: Nondisplaced LEFT frontal bone fractures. Other: N/A CT MAXILLOFACIAL FINDINGS Osseous: TMJ alignment normal bilaterally. Nondisplaced fractures involving LEFT zygomatic arch, lateral and superolateral walls of the LEFT orbit, and LEFT frontal bone. LEFT frontal bone fracture extends obliquely to involve the LEFT frontal sinus. Fracture at medial wall LEFT orbit extending into base of LEFT nasal bone. Questionable tiny fracture at anterior LEFT maxilla. Orbits: Intraorbital soft tissue planes clear. LEFT periorbital contusion and soft tissue swelling. Sinuses: Fluid air-fluid levels within the LEFT frontal, sphenoid, and BILATERAL maxillary sinuses. Middle ear cavities and mastoid air cells clear Soft tissues: Foci of soft tissue gas LEFT periorbital and at LEFT infratemporal fossa. CT CERVICAL SPINE FINDINGS Alignment: Normal Skull base and vertebrae: Prior anterior fusion C3-C4. Interval removal of prior anterior C4-C6 plate with intact bony fusion. Interval posterior fusion C3-C7. Disc space narrowing with endplate spur formation at C6-C7. Vertebral body heights maintained without acute fracture or subluxation. Scattered facet degenerative changes. Skull base intact. Soft tissues and spinal canal: Prevertebral soft tissues normal thickness. Atherosclerotic calcifications at carotid bifurcations bilaterally and at proximal great vessels. 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 Scattered acute extra-axial hemorrhage identified at anterior, interhemispheric, BILATERAL frontal, and anterior LEFT middle cranial fossa. No additional acute intracranial abnormalities. Nondisplaced LEFT frontal bone fractures. Nondisplaced fractures involving LEFT zygomatic arch, lateral and superolateral walls of the LEFT  orbit, and LEFT frontal bone. Fracture at medial wall LEFT orbit extending into base of LEFT nasal bone. Questionable tiny transverse fracture at anterior LEFT maxilla. Degenerative disc and facet disease changes of the cervical spine. Prior anterior and posterior fusion changes of the cervical spine as above. No acute cervical spine abnormalities. Critical Value/emergent results were called by telephone at the time of interpretation on 07/07/2021 at 1145 hrs to provider [facility] VENTER PA, who verbally acknowledged these results. Electronically Signed   By: Lavonia Dana M.D.   On: 07/07/2021 11:47   DG HIP UNILAT WITH PELVIS 2-3 VIEWS RIGHT  Result Date: 07/07/2021 CLINICAL DATA:  Right hip pain after fall today. EXAM: DG HIP (WITH OR WITHOUT PELVIS) 2-3V RIGHT COMPARISON:  None. FINDINGS: There is no evidence of hip fracture or dislocation. There is no evidence of arthropathy or other focal bone abnormality. IMPRESSION: Negative. Electronically Signed   By: Marijo Conception M.D.   On: 07/07/2021 11:43   CT Maxillofacial WO CM  Result Date: 07/07/2021 CLINICAL DATA:  [place] today striking head, level 2 fall, on blood thinners, LEFT frontal laceration, uncertain loss of consciousness EXAM: CT HEAD WITHOUT CONTRAST CT MAXILLOFACIAL WITHOUT CONTRAST CT CERVICAL SPINE WITHOUT CONTRAST TECHNIQUE: Multidetector CT imaging of the head, cervical spine, and maxillofacial structures were performed using the standard protocol without intravenous contrast. Multiplanar CT image reconstructions of the cervical spine and maxillofacial structures were also generated. COMPARISON:  CT head 11/23/2010 FINDINGS: CT HEAD FINDINGS Brain: Generalized atrophy. Normal ventricular morphology. No midline shift or mass effect. Dural calcifications in falx. Scattered high attenuation acute hemorrhage identified extra-axial at anterior aspect of interhemispheric fissure, in BILATERAL frontal regions, and at anterior aspect of LEFT middle  cranial fossa consistent with acute intracranial hemorrhage. Small vessel chronic ischemic changes of deep cerebral white matter. No intraparenchymal hemorrhage or infarction. No mass. Vascular: No hyperdense vessels. Atherosclerotic calcifications of internal carotid arteries at skull base Skull: Nondisplaced LEFT frontal bone fractures. Other: N/A CT MAXILLOFACIAL FINDINGS Osseous: TMJ alignment normal bilaterally. Nondisplaced fractures involving LEFT zygomatic arch, lateral and superolateral walls of the LEFT orbit, and LEFT frontal bone. LEFT frontal bone fracture extends obliquely to involve the LEFT frontal sinus. Fracture at medial wall LEFT orbit extending into base of LEFT nasal bone. Questionable tiny fracture at anterior LEFT maxilla. Orbits: Intraorbital soft tissue planes clear. LEFT periorbital contusion and soft tissue swelling. Sinuses: Fluid air-fluid levels within the LEFT frontal, sphenoid, and BILATERAL maxillary sinuses. Middle ear cavities and mastoid air cells clear Soft tissues: Foci of soft tissue gas LEFT periorbital and at LEFT infratemporal fossa. CT CERVICAL SPINE FINDINGS Alignment: Normal Skull base and vertebrae: Prior anterior fusion C3-C4. Interval removal of prior anterior C4-C6 plate with intact bony fusion. Interval posterior fusion C3-C7. Disc space narrowing with endplate spur formation at C6-C7. Vertebral body heights maintained without acute fracture or subluxation. Scattered facet degenerative changes. Skull base intact. Soft tissues and spinal canal: Prevertebral soft tissues normal thickness. Atherosclerotic calcifications at carotid bifurcations bilaterally and at proximal great vessels. 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 Scattered acute extra-axial hemorrhage identified at anterior, interhemispheric, BILATERAL  frontal, and anterior LEFT middle cranial fossa. No additional acute intracranial abnormalities. Nondisplaced LEFT frontal bone fractures. Nondisplaced fractures involving LEFT zygomatic arch, lateral and superolateral walls of the LEFT orbit, and LEFT frontal bone. Fracture at medial wall LEFT orbit extending into base of LEFT nasal bone. Questionable tiny transverse fracture at anterior LEFT maxilla. Degenerative disc and facet disease changes of the cervical spine. Prior anterior and posterior fusion changes of the cervical spine as above. No acute cervical spine abnormalities. Critical Value/emergent results were called by telephone at the time of interpretation on 07/07/2021 at 1145 hrs to provider [facility], who verbally acknowledged these results. Electronically Signed   By: Lavonia Dana M.D.   On: 07/07/2021 11:47    EKG: Independently reviewed.  Multiple PVCs  Assessment/Plan Active Problems:   Intracranial bleed ([place])  (please populate  well all problems here in Problem List. (For example, if patient is on BP meds at home and you resume or decide to hold them, it is a problem that needs to be her. Same for CAD, COPD, HLD and so on)  Head trauma with multifocal intracranial bleed secondary to fall -No symptoms signs of increased intracranial pressure -As per recommendation from neurosurgery, hold aspirin and Plavix, repeat CT scan tomorrow. -Neurochecks. BP goal 130/80, PRN Hydralazine IV -Check orthostatic vital signs, PT evaluation.  Acute blood loss anemia, on top of chronic iron deficiency anemia -Getting PRBC x2 -Repeat H&H after transfusion.  Multifocal skull, maxillary, nose fracture -Evaluated by ENT, outpatient follow-up.  Frequent PVCs -Unsure whether this correlated patient' fall, and there is no clear evidence of syncope or near syncope last night. -Telemetry monitoring, K= 4.2, will check magnesium.  Check TSH, T4 T3. -Start low-dose of metoprolol 12.5 mg twice  daily.  UTI -Appears to have uncontrolled BPH -Start ceftriaxone, urine culture -Check PVR.  Chronic diastolic CHF -Unsure which the patient had syncope versus near syncope last night, check echocardiogram.  CKD stage II -Euvolemic, creatinine close to baseline.  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  DVT prophylaxis: SCD Code Status: Full code Family Communication: Tried to reach patient wife over the phone, not picking up, and cannot leave message on home number. Disposition Plan: Expect 1 to 2 days hospital stay Consults called: Neurosurgery, ENT Admission status: Telemetry admission.   Lequita Halt MD [facility] Pager [phone]  07/07/2021, 6:42 PM

## 2021-07-07 NOTE — Consult Note (Signed)
Trauma Consult Note  Russell [name] [birth year]  [id number].    Requesting MD: Eustaquio Maize PA-C Chief Complaint/Reason for Consult: Fall  HPI:  Patient is an 80 year old male who presented today as a level 2 trauma activation after unwitnessed fall. Per EMS the wife found him in the bathroom and cleaning blood from face. He went to the living room and had a syncopal event. She found blood at the top and bottom of the stairs but patient is unable to remember what happened. Per EMS, wife also reported that patient was altered prior to presumed fall. Patient complains of headach and mild pain in RUE where BP cuff is located. Per chart, patient is on plavix. PMH otherwise significant for CAD, mitral regurgitation, COPD, current tobacco abuse, HTN, HLD, BPH, T2DM, Hx of prostate cancer, hx of syncopal episode vs seizure in 2015, GERD, glaucoma, and peripheral vascular disease. Patient has allergy listed to tramadol. He reports he currently smokes 1 ppd. Denies illicit drug use and reports he stopped drinking alcohol in 2014.   ROS: Review of Systems  Constitutional:  Negative for chills and fever.  HENT:  Negative for ear pain and tinnitus.   Eyes:  Positive for pain. Negative for double vision.  Respiratory:  Negative for cough, shortness of breath and wheezing.   Cardiovascular:  Negative for chest pain and palpitations.  Gastrointestinal:  Negative for abdominal pain, nausea and vomiting.  Genitourinary:  Negative for dysuria, frequency and urgency.  Musculoskeletal:  Positive for falls. Negative for back pain and neck pain.  Neurological:  Positive for dizziness (chronic) and headaches. Negative for focal weakness.  All other systems reviewed and are negative.  Family History  Problem Relation Age of Onset   Unexplained death Mother        died at a young age, no known cause   Heart attack Father        age unknown   Diabetes Other    Cancer Other     Past Medical  History:  Diagnosis Date   AKI (acute kidney injury) ([place]) 06/23/2017   Arthritis    SHOUDLERS, LUMBAR   BPH with obstruction/lower urinary tract symptoms    BPH with urinary obstruction 01/15/2016   COPD (chronic obstructive pulmonary disease) ([place])    Coronary atherosclerosis    denied knowledge of this 10/27/16   Depression    Diverticulosis of colon    Dyspnea    ED (erectile dysfunction)    Full dentures    GERD (gastroesophageal reflux disease)    not current   Glaucoma    History of gastritis    History of scabies    2008   History of seizure    2013-- x2   idiopathic --  none since   History of syncope    03-22-2014  DX  VAGAL RESPONSE   Hyperlipidemia, mixed    Hypertension    Mitral regurgitation    Peripheral arterial disease ([place])    one vessel runoff bilaterally via peroneal arteries   Prostate cancer ([place])    Seizures ([place]) 03/2014   ? or syncope- patient refused to be admitted for further studies- "felt better"   Type 2 diabetes, diet controlled ([place])    patient and his wife said no- have not been told 01/21/17   Wears glasses     Past Surgical History:  Procedure Laterality Date   ABDOMINAL AORTOGRAM W/LOWER EXTREMITY Left 12/18/2020   Procedure: ABDOMINAL AORTOGRAM  W/LOWER EXTREMITY;  Surgeon: Cherre Robins, MD;  Location: [facility];  Service: Cardiovascular;  Laterality: Left;   ABDOMINAL AORTOGRAM W/LOWER EXTREMITY N/A 02/26/2021   Procedure: ABDOMINAL AORTOGRAM W/LOWER EXTREMITY;  Surgeon: Cherre Robins, MD;  Location: [facility];  Service: Cardiovascular;  Laterality: N/A;   ANTERIOR CERVICAL DECOMP/DISCECTOMY FUSION  10-16-2009   C4 -- C6   ANTERIOR CERVICAL DECOMP/DISCECTOMY FUSION N/A 01/27/2017   Procedure: ANTERIOR CERVICAL DECOMPRESSION FUSION CERVICAL 3-4 WITH INSTRUMENTATION AND ALLOGRAFT;  Surgeon: Phylliss Bob, MD;  Location: [place];  Service: Orthopedics;  Laterality: N/A;  ANTERIOR CERVICAL DECOMPRESSION FUSION CERVICAL  3-4 WITH INSTRUMENTATION AND ALLOGRAFT   CAPSULOTOMY Right 01/26/2013   Procedure: MINOR CAPSULOTOMY;  Surgeon: Myrtha Mantis., MD;  Location: [place];  Service: Ophthalmology;  Laterality: Right;   CARPAL TUNNEL RELEASE Left 01/02/2019   Procedure: LEFT CARPAL TUNNEL RELEASE;  Surgeon: Leanora Cover, MD;  Location: [place];  Service: Orthopedics;  Laterality: Left;   CARPECTOMY Right 03/13/2014   Procedure: RIGHT  PROXIMAL ROW CARPECTOMY;  Surgeon: Tennis Must, MD;  Location: [place];  Service: Orthopedics;  Laterality: Right;   CARPECTOMY Left 10/22/2015   Procedure: LEFT WRIST PROXIMAL ROW CARPECTOMY ;  Surgeon: Leanora Cover, MD;  Location: [place];  Service: Orthopedics;  Laterality: Left;   CARPOMETACARPAL ([place]) FUSION OF THUMB Right 03/13/2014   Procedure: RIGHT FUSION OF THUMB CARPOMETACARPAL [facility]) JOINT;  Surgeon: Tennis Must, MD;  Location: [place];  Service: Orthopedics;  Laterality: Right;   CARPOMETACARPEL SUSPENSION PLASTY Left 04/04/2020   Procedure: LEFT THUMB TRAPECIECTOMY AND SUSPENSIONPLASTY;  Surgeon: Leanora Cover, MD;  Location: [place];  Service: Orthopedics;  Laterality: Left;   CATARACT EXTRACTION W/ INTRAOCULAR LENS  IMPLANT, BILATERAL  2009   COLONOSCOPY  08-31-2007   COLONOSCOPY     CRYOABLATION N/A 01/14/2015   Procedure: CRYO ABLATION PROSTATE;  Surgeon: Ailene Rud, MD;  Location[facility];  Service: Urology;  Laterality: N/A;   ESOPHAGOGASTRODUODENOSCOPY  last one 09-11-2008   EXCISION MASS LEFT FACE , SUBORBITAL AREA, PLASTER RECONSTRUCTION  02-09-2011   EXCISIONAL DEBRIDEMENT AND REPAIR RIGHT QUADRICEP TENDON  07-05-2000   FOOT SURGERY Left    I & D EXTREMITY Right 05/24/2013   Procedure: RIGHT INDEX WOUND DEBRIDEMENT AND CLOSURE;  Surgeon: Jolyn Nap, MD;  Location: [place];  Service: Orthopedics;  Laterality: Right;    INSERTION OF SUPRAPUBIC CATHETER N/A 01/14/2015   Procedure: SUPRAPUBIC TUBE PLACEMENT;  Surgeon: Ailene Rud, MD;  Location: [facility];  Service: Urology;  Laterality: N/A;   KNEE ARTHROSCOPY Right 2008   LARYNGOSCOPY AND ESOPHAGOSCOPY  06-13-2010   MARSUPIALIZATION LEFT LARGE VALLECULAR CYST   MASS EXCISION Left 10/22/2015   Procedure: EXCISION MASS OF LEFT INDEX FINGER;  Surgeon: Leanora Cover, MD;  Location: [place];  Service: Orthopedics;  Laterality: Left;   ORCHIECTOMY Left 02/24/2015   Procedure: SCROTAL EXPLORATION WITH LEFT ORCHIECTOMY;  Surgeon: Kathie Rhodes, MD;  Location: [place];  Service: Urology;  Laterality: Left;   PERIPHERAL VASCULAR INTERVENTION Left 02/26/2021   Procedure: PERIPHERAL VASCULAR INTERVENTION;  Surgeon: Cherre Robins, MD;  Location: [facility];  Service: Cardiovascular;  Laterality: Left;  Superficial femoral   POSTERIOR CERVICAL FUSION/FORAMINOTOMY N/A 01/28/2017   Procedure: POSTERIOR SPINAL FUSION CERVICAL 3-4, CERVICAL 4-5, CERVICAL 5-6, CERVICAL 6-7 WITH INSTRUMENATION AND ALLOGRAFT;  Surgeon:  Phylliss Bob, MD;  Location: [place];  Service: Orthopedics;  Laterality: N/A;  POSTERIOR SPINAL FUSION CERVICAL 3-4, CERVICAL 4-5, CERVICAL 5-6, CERVICAL 6-7 WITH INSTRUMENATION AND ALLOGRAFT   SHOULDER ARTHROSCOPY/ DEBRIDEMENT LABRAL TEAR/  BICEPS TENOTOMY Left 09-24-2011   TONSILLECTOMY  as child   TRANSTHORACIC ECHOCARDIOGRAM  02-21-2010   normal LVF/  ef 55-60%/ mild to moderate MR/  moderate LAE/  mild TR   YAG LASER APPLICATION Right 08/23/5700   Procedure: YAG LASER APPLICATION;  Surgeon: Myrtha Mantis., MD;  Location: [facility];  Service: Ophthalmology;  Laterality: Right;   YAG LASER CAPSULOTOMY, LEFT EYE  01-08-2011    Social History:  reports that he has been smoking cigarettes. He has a 59.00 pack-year smoking history. He has never used smokeless tobacco. He reports that he does not drink alcohol and does not  use drugs.  Allergies:  Allergies  Allergen Reactions   Tramadol     Confusion (intolerance)    (Not in a hospital admission)   Blood pressure 137/67, pulse 76, temperature 97.6 F (36.4 C), temperature source Oral, resp. rate 18, SpO2 100 %. Physical Exam:  General: pleasant, WD, elderly male who is laying in bed in NAD HEENT: 3 cm laceration to left hairline without active bleeding.  Pupils are equal and round and EOMI bilaterally. Mild edema and ecchymosis over L eye and cheek Neck: no bony or muscular ttp Heart: regular, rate, and rhythm.  Normal s1,s2. No obvious murmurs, gallops, or rubs noted.  Palpable radial and pedal pulses bilaterally Lungs: CTAB, no wheezes, rhonchi, or rales noted.  Respiratory effort nonlabored Abd: soft, NT, ND, +BS, no masses, hernias, or organomegaly MS: all 4 extremities are symmetrical with no cyanosis, clubbing, or edema. Mild pain in R elbow with extension, not point tender over any bony processes; no ttp over spine  Skin: warm and dry with no masses, lesions, or rashes Neuro: Cranial nerves 2-12 grossly intact, follows commands, speech clear Psych: Alert, oriented to self and place, somewhat oriented to situation.    Results for orders placed or performed during the hospital encounter of 07/07/21 (from the past 48 hour(s))  CBG monitoring, ED     Status: Abnormal   Collection Time: 07/07/21 10:21 AM  Result Value Ref Range   Glucose-Capillary 119 (H) 70 - 99 mg/dL    Comment: Glucose reference range applies only to samples taken after fasting for at least 8 hours.  Type and screen [place]     Status: None (Preliminary result)   Collection Time: 07/07/21 11:56 AM  Result Value Ref Range   ABO/RH(D) O POS    Antibody Screen PENDING    Sample Expiration      07/10/2021,2359 Performed at [facility], [place] [address], [place], [place] [address]   I-stat chem 8, ED (not at [facility] or [facility])     Status: Abnormal    Collection Time: 07/07/21 12:05 PM  Result Value Ref Range   Sodium 139 135 - 145 mmol/L   Potassium 4.3 3.5 - 5.1 mmol/L   Chloride 109 98 - 111 mmol/L   BUN 30 (H) 8 - 23 mg/dL   Creatinine, Ser 2.10 (H) 0.61 - 1.24 mg/dL   Glucose, Bld 114 (H) 70 - 99 mg/dL    Comment: Glucose reference range applies only to samples taken after fasting for at least 8 hours.   Calcium, Ion 1.20 1.15 - 1.40 mmol/L   TCO2 21 (L) 22 -  32 mmol/L   Hemoglobin 7.1 (L) 13.0 - 17.0 g/dL   HCT 21.0 (L) 39.0 - 52.0 %   CT Head Wo Contrast  Result Date: 07/07/2021 CLINICAL DATA:  [place] today striking head, level 2 fall, on blood thinners, LEFT frontal laceration, uncertain loss of consciousness EXAM: CT HEAD WITHOUT CONTRAST CT MAXILLOFACIAL WITHOUT CONTRAST CT CERVICAL SPINE WITHOUT CONTRAST TECHNIQUE: Multidetector CT imaging of the head, cervical spine, and maxillofacial structures were performed using the standard protocol without intravenous contrast. Multiplanar CT image reconstructions of the cervical spine and maxillofacial structures were also generated. COMPARISON:  CT head 11/23/2010 FINDINGS: CT HEAD FINDINGS Brain: Generalized atrophy. Normal ventricular morphology. No midline shift or mass effect. Dural calcifications in falx. Scattered high attenuation acute hemorrhage identified extra-axial at anterior aspect of interhemispheric fissure, in BILATERAL frontal regions, and at anterior aspect of LEFT middle cranial fossa consistent with acute intracranial hemorrhage. Small vessel chronic ischemic changes of deep cerebral white matter. No intraparenchymal hemorrhage or infarction. No mass. Vascular: No hyperdense vessels. Atherosclerotic calcifications of internal carotid arteries at skull base Skull: Nondisplaced LEFT frontal bone fractures. Other: N/A CT MAXILLOFACIAL FINDINGS Osseous: TMJ alignment normal bilaterally. Nondisplaced fractures involving LEFT zygomatic arch, lateral and superolateral walls of the  LEFT orbit, and LEFT frontal bone. LEFT frontal bone fracture extends obliquely to involve the LEFT frontal sinus. Fracture at medial wall LEFT orbit extending into base of LEFT nasal bone. Questionable tiny fracture at anterior LEFT maxilla. Orbits: Intraorbital soft tissue planes clear. LEFT periorbital contusion and soft tissue swelling. Sinuses: Fluid air-fluid levels within the LEFT frontal, sphenoid, and BILATERAL maxillary sinuses. Middle ear cavities and mastoid air cells clear Soft tissues: Foci of soft tissue gas LEFT periorbital and at LEFT infratemporal fossa. CT CERVICAL SPINE FINDINGS Alignment: Normal Skull base and vertebrae: Prior anterior fusion C3-C4. Interval removal of prior anterior C4-C6 plate with intact bony fusion. Interval posterior fusion C3-C7. Disc space narrowing with endplate spur formation at C6-C7. Vertebral body heights maintained without acute fracture or subluxation. Scattered facet degenerative changes. Skull base intact. Soft tissues and spinal canal: Prevertebral soft tissues normal thickness. Atherosclerotic calcifications at carotid bifurcations bilaterally and at proximal great vessels. 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 Scattered acute extra-axial hemorrhage identified at anterior, interhemispheric, BILATERAL frontal, and anterior LEFT middle cranial fossa. No additional acute intracranial abnormalities. Nondisplaced LEFT frontal bone fractures. Nondisplaced fractures involving LEFT zygomatic arch, lateral and superolateral walls of the LEFT orbit, and LEFT frontal bone. Fracture at medial wall LEFT orbit extending into base of LEFT nasal bone. Questionable tiny transverse fracture at anterior LEFT maxilla. Degenerative disc and facet disease changes of the cervical spine. Prior anterior and posterior fusion changes  of the cervical spine as above. No acute cervical spine abnormalities. Critical Value/emergent results were called by telephone at the time of interpretation on 07/07/2021 at 1145 hrs to provider [facility] VENTER PA, who verbally acknowledged these results. Electronically Signed   By: Lavonia Dana M.D.   On: 07/07/2021 11:47   CT Cervical Spine Wo Contrast  Result Date: 07/07/2021 CLINICAL DATA:  [place] today striking head, level 2 fall, on blood thinners, LEFT frontal laceration, uncertain loss of consciousness EXAM: CT HEAD WITHOUT CONTRAST CT MAXILLOFACIAL WITHOUT CONTRAST CT CERVICAL SPINE WITHOUT CONTRAST TECHNIQUE: Multidetector CT imaging of the head, cervical spine, and maxillofacial structures were performed using the standard protocol  without intravenous contrast. Multiplanar CT image reconstructions of the cervical spine and maxillofacial structures were also generated. COMPARISON:  CT head 11/23/2010 FINDINGS: CT HEAD FINDINGS Brain: Generalized atrophy. Normal ventricular morphology. No midline shift or mass effect. Dural calcifications in falx. Scattered high attenuation acute hemorrhage identified extra-axial at anterior aspect of interhemispheric fissure, in BILATERAL frontal regions, and at anterior aspect of LEFT middle cranial fossa consistent with acute intracranial hemorrhage. Small vessel chronic ischemic changes of deep cerebral white matter. No intraparenchymal hemorrhage or infarction. No mass. Vascular: No hyperdense vessels. Atherosclerotic calcifications of internal carotid arteries at skull base Skull: Nondisplaced LEFT frontal bone fractures. Other: N/A CT MAXILLOFACIAL FINDINGS Osseous: TMJ alignment normal bilaterally. Nondisplaced fractures involving LEFT zygomatic arch, lateral and superolateral walls of the LEFT orbit, and LEFT frontal bone. LEFT frontal bone fracture extends obliquely to involve the LEFT frontal sinus. Fracture at medial wall LEFT orbit extending into base of LEFT  nasal bone. Questionable tiny fracture at anterior LEFT maxilla. Orbits: Intraorbital soft tissue planes clear. LEFT periorbital contusion and soft tissue swelling. Sinuses: Fluid air-fluid levels within the LEFT frontal, sphenoid, and BILATERAL maxillary sinuses. Middle ear cavities and mastoid air cells clear Soft tissues: Foci of soft tissue gas LEFT periorbital and at LEFT infratemporal fossa. CT CERVICAL SPINE FINDINGS Alignment: Normal Skull base and vertebrae: Prior anterior fusion C3-C4. Interval removal of prior anterior C4-C6 plate with intact bony fusion. Interval posterior fusion C3-C7. Disc space narrowing with endplate spur formation at C6-C7. Vertebral body heights maintained without acute fracture or subluxation. Scattered facet degenerative changes. Skull base intact. Soft tissues and spinal canal: Prevertebral soft tissues normal thickness. Atherosclerotic calcifications at carotid bifurcations bilaterally and at proximal great vessels. 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 Scattered acute extra-axial hemorrhage identified at anterior, interhemispheric, BILATERAL frontal, and anterior LEFT middle cranial fossa. No additional acute intracranial abnormalities. Nondisplaced LEFT frontal bone fractures. Nondisplaced fractures involving LEFT zygomatic arch, lateral and superolateral walls of the LEFT orbit, and LEFT frontal bone. Fracture at medial wall LEFT orbit extending into base of LEFT nasal bone. Questionable tiny transverse fracture at anterior LEFT maxilla. Degenerative disc and facet disease changes of the cervical spine. Prior anterior and posterior fusion changes of the cervical spine as above. No acute cervical spine abnormalities. Critical Value/emergent results were called by telephone at the time of interpretation on 07/07/2021 at  1145 hrs to provider [facility] VENTER PA, who verbally acknowledged these results. Electronically Signed   By: Lavonia Dana M.D.   On: 07/07/2021 11:47   DG HIP UNILAT WITH PELVIS 2-3 VIEWS RIGHT  Result Date: 07/07/2021 CLINICAL DATA:  Right hip pain after fall today. EXAM: DG HIP (WITH OR WITHOUT PELVIS) 2-3V RIGHT COMPARISON:  None. FINDINGS: There is no evidence of hip fracture or dislocation. There is no evidence of arthropathy or other focal bone abnormality. IMPRESSION: Negative. Electronically Signed   By: Marijo Conception M.D.   On: 07/07/2021 11:43   CT Maxillofacial WO CM  Result Date: 07/07/2021 CLINICAL DATA:  [place] today striking head, level 2 fall, on blood thinners, LEFT frontal laceration, uncertain loss of consciousness EXAM: CT HEAD WITHOUT CONTRAST CT MAXILLOFACIAL WITHOUT CONTRAST CT CERVICAL SPINE WITHOUT CONTRAST TECHNIQUE: Multidetector CT imaging of the head, cervical spine, and maxillofacial structures were performed using the standard protocol without intravenous contrast. Multiplanar CT image reconstructions of the cervical  spine and maxillofacial structures were also generated. COMPARISON:  CT head 11/23/2010 FINDINGS: CT HEAD FINDINGS Brain: Generalized atrophy. Normal ventricular morphology. No midline shift or mass effect. Dural calcifications in falx. Scattered high attenuation acute hemorrhage identified extra-axial at anterior aspect of interhemispheric fissure, in BILATERAL frontal regions, and at anterior aspect of LEFT middle cranial fossa consistent with acute intracranial hemorrhage. Small vessel chronic ischemic changes of deep cerebral white matter. No intraparenchymal hemorrhage or infarction. No mass. Vascular: No hyperdense vessels. Atherosclerotic calcifications of internal carotid arteries at skull base Skull: Nondisplaced LEFT frontal bone fractures. Other: N/A CT MAXILLOFACIAL FINDINGS Osseous: TMJ alignment normal bilaterally. Nondisplaced fractures involving LEFT  zygomatic arch, lateral and superolateral walls of the LEFT orbit, and LEFT frontal bone. LEFT frontal bone fracture extends obliquely to involve the LEFT frontal sinus. Fracture at medial wall LEFT orbit extending into base of LEFT nasal bone. Questionable tiny fracture at anterior LEFT maxilla. Orbits: Intraorbital soft tissue planes clear. LEFT periorbital contusion and soft tissue swelling. Sinuses: Fluid air-fluid levels within the LEFT frontal, sphenoid, and BILATERAL maxillary sinuses. Middle ear cavities and mastoid air cells clear Soft tissues: Foci of soft tissue gas LEFT periorbital and at LEFT infratemporal fossa. CT CERVICAL SPINE FINDINGS Alignment: Normal Skull base and vertebrae: Prior anterior fusion C3-C4. Interval removal of prior anterior C4-C6 plate with intact bony fusion. Interval posterior fusion C3-C7. Disc space narrowing with endplate spur formation at C6-C7. Vertebral body heights maintained without acute fracture or subluxation. Scattered facet degenerative changes. Skull base intact. Soft tissues and spinal canal: Prevertebral soft tissues normal thickness. Atherosclerotic calcifications at carotid bifurcations bilaterally and at proximal great vessels. 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 Scattered acute extra-axial hemorrhage identified at anterior, interhemispheric, BILATERAL frontal, and anterior LEFT middle cranial fossa. No additional acute intracranial abnormalities. Nondisplaced LEFT frontal bone fractures. Nondisplaced fractures involving LEFT zygomatic arch, lateral and superolateral walls of the LEFT orbit, and LEFT frontal bone. Fracture at medial wall LEFT orbit extending into base of LEFT nasal bone. Questionable tiny transverse fracture at anterior LEFT maxilla. Degenerative disc and facet disease changes of the  cervical spine. Prior anterior and posterior fusion changes of the cervical spine as above. No acute cervical spine abnormalities. Critical Value/emergent results were called by telephone at the time of interpretation on 07/07/2021 at 1145 hrs to provider [facility] VENTER PA, who verbally acknowledged these results. Electronically Signed   By: Lavonia Dana M.D.   On: 07/07/2021 11:47      Assessment/Plan Fall, unwitnessed Possible syncope TBI - small scattered extra-axial hemorrhages- anterior, interhemispheric, bilateral frontal and anterior left middle cranial foss; NS consulted and recommended repeat CTH in AM, hold plavix Facial fractures - recommend ENT consult, appear to be non-displaced, suspect non-op management  Scalp laceration - overlying frontal fracture, defer to NS but suspect this can be closed R elbow pain - films ordered   FEN: ok to have diet from trauma standpoint, consider SLP eval  VTE: SCDs, hold plavix until cleared to resume by NS ID: [email or web address] for open fx  Recommend medical admission given significant chronic medical comorbidities. NS to follow for TBI. ENT should be consulted for facial fractures if they have not been consulted already. Check R elbow film to r/o fracture. Recommend TBI team therapies. No other recommendations from a trauma surgery standpoint, please call if we can be of further  assistance.   CAD mitral regurgitation COPD current tobacco abuse HTN HLD BPH T2DM Hx of prostate cancer hx of syncopal episode vs seizure in 2015 GERD Glaucoma peripheral vascular disease   Norm Parcel, [facility] Surgery 07/07/2021, 12:47 PM Please see Amion for pager number during day hours 7:00am-4:30pm

## 2021-07-07 NOTE — Consult Note (Signed)
Reason for Consult: Facial trauma Referring Physician: Lequita Halt, MD  Russell [name] is an 80 y.o. male.  HPI: History of a fall with injuries to the face  Past Medical History:  Diagnosis Date   AKI (acute kidney injury) ([place]) 06/23/2017   Arthritis    SHOUDLERS, LUMBAR   BPH with obstruction/lower urinary tract symptoms    BPH with urinary obstruction 01/15/2016   COPD (chronic obstructive pulmonary disease) ([place])    Coronary atherosclerosis    denied knowledge of this 10/27/16   Depression    Diverticulosis of colon    Dyspnea    ED (erectile dysfunction)    Full dentures    GERD (gastroesophageal reflux disease)    not current   Glaucoma    History of gastritis    History of scabies    2008   History of seizure    2013-- x2   idiopathic --  none since   History of syncope    03-22-2014  DX  VAGAL RESPONSE   Hyperlipidemia, mixed    Hypertension    Mitral regurgitation    Peripheral arterial disease ([name])    one vessel runoff bilaterally via peroneal arteries   Prostate cancer ([place])    Seizures ([place]) 03/2014   ? or syncope- patient refused to be admitted for further studies- "felt better"   Type 2 diabetes, diet controlled ([place])    patient and his wife said no- have not been told 01/21/17   Wears glasses     Past Surgical History:  Procedure Laterality Date   ABDOMINAL AORTOGRAM W/LOWER EXTREMITY Left 12/18/2020   Procedure: ABDOMINAL AORTOGRAM W/LOWER EXTREMITY;  Surgeon: Cherre Robins, MD;  Location: [facility];  Service: Cardiovascular;  Laterality: Left;   ABDOMINAL AORTOGRAM W/LOWER EXTREMITY N/A 02/26/2021   Procedure: ABDOMINAL AORTOGRAM W/LOWER EXTREMITY;  Surgeon: Cherre Robins, MD;  Location: [facility];  Service: Cardiovascular;  Laterality: N/A;   ANTERIOR CERVICAL DECOMP/DISCECTOMY FUSION  10-16-2009   C4 -- C6   ANTERIOR CERVICAL DECOMP/DISCECTOMY FUSION N/A 01/27/2017   Procedure: ANTERIOR CERVICAL DECOMPRESSION FUSION  CERVICAL 3-4 WITH INSTRUMENTATION AND ALLOGRAFT;  Surgeon: Phylliss Bob, MD;  Location: [place];  Service: Orthopedics;  Laterality: N/A;  ANTERIOR CERVICAL DECOMPRESSION FUSION CERVICAL 3-4 WITH INSTRUMENTATION AND ALLOGRAFT   CAPSULOTOMY Right 01/26/2013   Procedure: MINOR CAPSULOTOMY;  Surgeon: Myrtha Mantis., MD;  Location: [facility];  Service: Ophthalmology;  Laterality: Right;   CARPAL TUNNEL RELEASE Left 01/02/2019   Procedure: LEFT CARPAL TUNNEL RELEASE;  Surgeon: Leanora Cover, MD;  Location: [facility];  Service: Orthopedics;  Laterality: Left;   CARPECTOMY Right 03/13/2014   Procedure: RIGHT  PROXIMAL ROW CARPECTOMY;  Surgeon: Tennis Must, MD;  Location: [facility];  Service: Orthopedics;  Laterality: Right;   CARPECTOMY Left 10/22/2015   Procedure: LEFT WRIST PROXIMAL ROW CARPECTOMY ;  Surgeon: Leanora Cover, MD;  Location: [facility];  Service: Orthopedics;  Laterality: Left;   CARPOMETACARPAL ([place]) FUSION OF THUMB Right 03/13/2014   Procedure: RIGHT FUSION OF THUMB CARPOMETACARPAL [facility]) JOINT;  Surgeon: Tennis Must, MD;  Location: [facility];  Service: Orthopedics;  Laterality: Right;   CARPOMETACARPEL SUSPENSION PLASTY Left 04/04/2020   Procedure: LEFT THUMB TRAPECIECTOMY AND SUSPENSIONPLASTY;  Surgeon: Leanora Cover, MD;  Location: [facility];  Service: Orthopedics;  Laterality: Left;   CATARACT EXTRACTION W/ INTRAOCULAR LENS  IMPLANT, BILATERAL  2009  COLONOSCOPY  08-31-2007   COLONOSCOPY     CRYOABLATION N/A 01/14/2015   Procedure: CRYO ABLATION PROSTATE;  Surgeon: Ailene Rud, MD;  Location: [facility];  Service: Urology;  Laterality: N/A;   ESOPHAGOGASTRODUODENOSCOPY  last one 09-11-2008   EXCISION MASS LEFT FACE , SUBORBITAL AREA, PLASTER RECONSTRUCTION  02-09-2011   EXCISIONAL DEBRIDEMENT AND REPAIR RIGHT QUADRICEP TENDON  07-05-2000   FOOT SURGERY Left    I & D EXTREMITY Right  05/24/2013   Procedure: RIGHT INDEX WOUND DEBRIDEMENT AND CLOSURE;  Surgeon: Jolyn Nap, MD;  Location: [facility];  Service: Orthopedics;  Laterality: Right;   INSERTION OF SUPRAPUBIC CATHETER N/A 01/14/2015   Procedure: SUPRAPUBIC TUBE PLACEMENT;  Surgeon: Ailene Rud, MD;  Location: [facility];  Service: Urology;  Laterality: N/A;   KNEE ARTHROSCOPY Right 2008   LARYNGOSCOPY AND ESOPHAGOSCOPY  06-13-2010   MARSUPIALIZATION LEFT LARGE VALLECULAR CYST   MASS EXCISION Left 10/22/2015   Procedure: EXCISION MASS OF LEFT INDEX FINGER;  Surgeon: Leanora Cover, MD;  Location: [place];  Service: Orthopedics;  Laterality: Left;   ORCHIECTOMY Left 02/24/2015   Procedure: SCROTAL EXPLORATION WITH LEFT ORCHIECTOMY;  Surgeon: Kathie Rhodes, MD;  Location: [facility];  Service: Urology;  Laterality: Left;   PERIPHERAL VASCULAR INTERVENTION Left 02/26/2021   Procedure: PERIPHERAL VASCULAR INTERVENTION;  Surgeon: Cherre Robins, MD;  Location: [facility];  Service: Cardiovascular;  Laterality: Left;  Superficial femoral   POSTERIOR CERVICAL FUSION/FORAMINOTOMY N/A 01/28/2017   Procedure: POSTERIOR SPINAL FUSION CERVICAL 3-4, CERVICAL 4-5, CERVICAL 5-6, CERVICAL 6-7 WITH INSTRUMENATION AND ALLOGRAFT;  Surgeon: Phylliss Bob, MD;  Location: [facility];  Service: Orthopedics;  Laterality: N/A;  POSTERIOR SPINAL FUSION CERVICAL 3-4, CERVICAL 4-5, CERVICAL 5-6, CERVICAL 6-7 WITH INSTRUMENATION AND ALLOGRAFT   SHOULDER ARTHROSCOPY/ DEBRIDEMENT LABRAL TEAR/  BICEPS TENOTOMY Left 09-24-2011   TONSILLECTOMY  as child   TRANSTHORACIC ECHOCARDIOGRAM  02-21-2010   normal LVF/  ef 55-60%/ mild to moderate MR/  moderate LAE/  mild TR   YAG LASER APPLICATION Right 9/0/3009   Procedure: YAG LASER APPLICATION;  Surgeon: Myrtha Mantis., MD;  [facility];  Service: Ophthalmology;  Laterality: Right;   YAG LASER CAPSULOTOMY, LEFT EYE  01-08-2011    Family History   Problem Relation Age of Onset   Unexplained death Mother        died at a young age, no known cause   Heart attack Father        age unknown   Diabetes Other    Cancer Other     Social History:  reports that he has been smoking cigarettes. He has a 59.00 pack-year smoking history. He has never used smokeless tobacco. He reports that he does not drink alcohol and does not use drugs.  Allergies:  Allergies  Allergen Reactions   Tramadol     Confusion (intolerance)    Medications: Reviewed  Results for orders placed or performed during the hospital encounter of 07/07/21 (from the past 48 hour(s))  CBG monitoring, ED     Status: Abnormal   Collection Time: 07/07/21 10:21 AM  Result Value Ref Range   Glucose-Capillary 119 (H) 70 - 99 mg/dL    Comment: Glucose reference range applies only to samples taken after fasting for at least 8 hours.  Urinalysis, Routine w reflex microscopic Urine, Clean Catch     Status: Abnormal   Collection Time: 07/07/21 10:41 AM  Result  Value Ref Range   Color, Urine YELLOW YELLOW   APPearance CLEAR CLEAR   Specific Gravity, Urine 1.010 1.005 - 1.030   pH 5.0 5.0 - 8.0   Glucose, UA NEGATIVE NEGATIVE mg/dL   Hgb urine dipstick SMALL (A) NEGATIVE   Bilirubin Urine NEGATIVE NEGATIVE   Ketones, ur NEGATIVE NEGATIVE mg/dL   Protein, ur NEGATIVE NEGATIVE mg/dL   Nitrite NEGATIVE NEGATIVE   Leukocytes,Ua LARGE (A) NEGATIVE   RBC / HPF 6-10 0 - 5 RBC/hpf   WBC, UA 21-50 0 - 5 WBC/hpf   Bacteria, UA RARE (A) NONE SEEN   Mucus PRESENT    Hyaline Casts, UA PRESENT     Comment: Performed at [facility] [address], [place], [place] [address]  Basic metabolic panel     Status: Abnormal   Collection Time: 07/07/21 11:53 AM  Result Value Ref Range   Sodium 138 135 - 145 mmol/L   Potassium 4.3 3.5 - 5.1 mmol/L   Chloride 105 98 - 111 mmol/L   CO2 22 22 - 32 mmol/L   Glucose, Bld 120 (H) 70 - 99 mg/dL    Comment: Glucose reference range  applies only to samples taken after fasting for at least 8 hours.   BUN 28 (H) 8 - 23 mg/dL   Creatinine, Ser 1.97 (H) 0.61 - 1.24 mg/dL   Calcium 9.3 8.9 - 10.3 mg/dL   GFR, Estimated 34 (L) >60 mL/min    Comment: (NOTE) Calculated using the CKD-EPI Creatinine Equation (2021)    Anion gap 11 5 - 15    Comment: Performed at [facility] [address], [place], [place] [address]  CBC with Differential     Status: Abnormal   Collection Time: 07/07/21 11:53 AM  Result Value Ref Range   WBC 13.9 (H) 4.0 - 10.5 K/uL   RBC 2.85 (L) 4.22 - 5.81 MIL/uL   Hemoglobin 6.5 (LL) 13.0 - 17.0 g/dL    Comment: This critical result has verified and been called to [place] by Kallie Locks on 07 18 2022 at 1342, and has been read back.    HCT 22.5 (L) 39.0 - 52.0 %   MCV 78.9 (L) 80.0 - 100.0 fL   MCH 22.8 (L) 26.0 - 34.0 pg   MCHC 28.9 (L) 30.0 - 36.0 g/dL   RDW 19.9 (H) 11.5 - 15.5 %   Platelets PLATELET CLUMPS NOTED ON SMEAR, UNABLE TO ESTIMATE 150 - 400 K/uL   nRBC 0.0 0.0 - 0.2 %   Neutrophils Relative % 88 %   Neutro Abs 12.1 (H) 1.7 - 7.7 K/uL   Lymphocytes Relative 7 %   Lymphs Abs 1.0 0.7 - 4.0 K/uL   Monocytes Relative 4 %   Monocytes Absolute 0.6 0.1 - 1.0 K/uL   Eosinophils Relative 0 %   Eosinophils Absolute 0.0 0.0 - 0.5 K/uL   Basophils Relative 1 %   Basophils Absolute 0.1 0.0 - 0.1 K/uL   Immature Granulocytes 0 %   Abs Immature Granulocytes 0.05 0.00 - 0.07 K/uL    Comment: Performed at [facility] [address] [place], [place] [address]  Type and screen [place]     Status: None (Preliminary result)   Collection Time: 07/07/21 11:56 AM  Result Value Ref Range   ABO/RH(D) O POS    Antibody Screen NEG    Sample Expiration 07/10/2021,2359    Unit Number [id number]  Blood Component Type RED CELLS,LR    Unit division 00    Status of Unit ISSUED    Transfusion Status OK TO TRANSFUSE    Crossmatch Result      Compatible Performed  at [facility], [place] [address], [place], [place] [address]    Unit Number [id number]    Blood Component Type RED CELLS,LR    Unit division 00    Status of Unit ISSUED    Transfusion Status OK TO TRANSFUSE    Crossmatch Result Compatible   I-stat chem 8, ED (not at [facility] or [facility])     Status: Abnormal   Collection Time: 07/07/21 12:05 PM  Result Value Ref Range   Sodium 139 135 - 145 mmol/L   Potassium 4.3 3.5 - 5.1 mmol/L   Chloride 109 98 - 111 mmol/L   BUN 30 (H) 8 - 23 mg/dL   Creatinine, Ser 2.10 (H) 0.61 - 1.24 mg/dL   Glucose, Bld 114 (H) 70 - 99 mg/dL    Comment: Glucose reference range applies only to samples taken after fasting for at least 8 hours.   Calcium, Ion 1.20 1.15 - 1.40 mmol/L   TCO2 21 (L) 22 - 32 mmol/L   Hemoglobin 7.1 (L) 13.0 - 17.0 g/dL   HCT 21.0 (L) 39.0 - 52.0 %  Resp Panel by RT-PCR (Flu A&B, Covid) Nasopharyngeal Swab     Status: None   Collection Time: 07/07/21 12:10 PM   Specimen: Nasopharyngeal Swab; Nasopharyngeal(NP) swabs in vial transport medium  Result Value Ref Range   SARS Coronavirus 2 by RT PCR NEGATIVE NEGATIVE    Comment: (NOTE) SARS-CoV-2 target nucleic acids are NOT DETECTED.  The SARS-CoV-2 RNA is generally detectable in upper respiratory specimens during the acute phase of infection. The lowest concentration of SARS-CoV-2 viral copies this assay can detect is 138 copies/mL. A negative result does not preclude SARS-Cov-2 infection and should not be used as the sole basis for treatment or other patient management decisions. A negative result may occur with  improper specimen collection/handling, submission of specimen other than nasopharyngeal swab, presence of viral mutation(s) within the areas targeted by this assay, and inadequate number of viral copies(<138 copies/mL). A negative result must be combined with clinical observations, patient history, and epidemiological information. The expected result is  Negative.  Fact Sheet for Patients:  EntrepreneurPulse.com.au  Fact Sheet for Healthcare Providers:  IncredibleEmployment.be  This test is no t yet approved or cleared by the [place] FDA and  has been authorized for detection and/or diagnosis of SARS-CoV-2 by FDA under an Emergency Use Authorization (EUA). This EUA will remain  in effect (meaning this test can be used) for the duration of the COVID-19 declaration under Section 564(b)(1) of the Act, 21 U.S.C.section 360bbb-3(b)(1), unless the authorization is terminated  or revoked sooner.       Influenza A by PCR NEGATIVE NEGATIVE   Influenza B by PCR NEGATIVE NEGATIVE    Comment: (NOTE) The Xpert Xpress SARS-CoV-2/FLU/RSV plus assay is intended as an aid in the diagnosis of influenza from Nasopharyngeal swab specimens and should not be used as a sole basis for treatment. Nasal washings and aspirates are unacceptable for Xpert Xpress SARS-CoV-2/FLU/RSV testing.  Fact Sheet for Patients: EntrepreneurPulse.com.au  Fact Sheet for Healthcare Providers: IncredibleEmployment.be  This test is not yet approved or cleared by the [place] FDA and has been authorized for detection and/or diagnosis of SARS-CoV-2 by FDA under an Emergency Use Authorization (EUA). This  EUA will remain in effect (meaning this test can be used) for the duration of the COVID-19 declaration under Section 564(b)(1) of the Act, 21 U.S.C. section 360bbb-3(b)(1), unless the authorization is terminated or revoked.  Performed at [facility], [place] [address], [place], [place] [address]   Prepare RBC (crossmatch)     Status: None   Collection Time: 07/07/21  2:30 PM  Result Value Ref Range   Order Confirmation      ORDER PROCESSED BY BLOOD BANK Performed at [facility], [place] [address] [place], [place] [address]     DG ELBOW COMPLETE RIGHT (3+VIEW)  Result Date:  07/07/2021 CLINICAL DATA:  Fall and altered mental status EXAM: RIGHT ELBOW - COMPLETE 3+ VIEW COMPARISON:  None. FINDINGS: There is no evidence of acute fracture. The elbow is held in flexion resulting in a suboptimal lateral view, however there is no evidence of displaced posterior fat pad to suggest a joint effusion. There is mild ulnar trochlear joint degenerative change. IMPRESSION: No evidence of acute fracture. Electronically Signed   By: Maurine Simmering   On: 07/07/2021 13:58   CT Head Wo Contrast  Result Date: 07/07/2021 CLINICAL DATA:  [place] today striking head, level 2 fall, on blood thinners, LEFT frontal laceration, uncertain loss of consciousness EXAM: CT HEAD WITHOUT CONTRAST CT MAXILLOFACIAL WITHOUT CONTRAST CT CERVICAL SPINE WITHOUT CONTRAST TECHNIQUE: Multidetector CT imaging of the head, cervical spine, and maxillofacial structures were performed using the standard protocol without intravenous contrast. Multiplanar CT image reconstructions of the cervical spine and maxillofacial structures were also generated. COMPARISON:  CT head 11/23/2010 FINDINGS: CT HEAD FINDINGS Brain: Generalized atrophy. Normal ventricular morphology. No midline shift or mass effect. Dural calcifications in falx. Scattered high attenuation acute hemorrhage identified extra-axial at anterior aspect of interhemispheric fissure, in BILATERAL frontal regions, and at anterior aspect of LEFT middle cranial fossa consistent with acute intracranial hemorrhage. Small vessel chronic ischemic changes of deep cerebral white matter. No intraparenchymal hemorrhage or infarction. No mass. Vascular: No hyperdense vessels. Atherosclerotic calcifications of internal carotid arteries at skull base Skull: Nondisplaced LEFT frontal bone fractures. Other: N/A CT MAXILLOFACIAL FINDINGS Osseous: TMJ alignment normal bilaterally. Nondisplaced fractures involving LEFT zygomatic arch, lateral and superolateral walls of the LEFT orbit, and LEFT  frontal bone. LEFT frontal bone fracture extends obliquely to involve the LEFT frontal sinus. Fracture at medial wall LEFT orbit extending into base of LEFT nasal bone. Questionable tiny fracture at anterior LEFT maxilla. Orbits: Intraorbital soft tissue planes clear. LEFT periorbital contusion and soft tissue swelling. Sinuses: Fluid air-fluid levels within the LEFT frontal, sphenoid, and BILATERAL maxillary sinuses. Middle ear cavities and mastoid air cells clear Soft tissues: Foci of soft tissue gas LEFT periorbital and at LEFT infratemporal fossa. CT CERVICAL SPINE FINDINGS Alignment: Normal Skull base and vertebrae: Prior anterior fusion C3-C4. Interval removal of prior anterior C4-C6 plate with intact bony fusion. Interval posterior fusion C3-C7. Disc space narrowing with endplate spur formation at C6-C7. Vertebral body heights maintained without acute fracture or subluxation. Scattered facet degenerative changes. Skull base intact. Soft tissues and spinal canal: Prevertebral soft tissues normal thickness. Atherosclerotic calcifications at carotid bifurcations bilaterally and at proximal great vessels. 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 Scattered acute extra-axial hemorrhage identified at anterior, interhemispheric, BILATERAL frontal, and anterior LEFT middle cranial fossa. No additional acute intracranial abnormalities. Nondisplaced LEFT  frontal bone fractures. Nondisplaced fractures involving LEFT zygomatic arch, lateral and superolateral walls of the LEFT orbit, and LEFT frontal bone. Fracture at medial wall LEFT orbit extending into base of LEFT nasal bone. Questionable tiny transverse fracture at anterior LEFT maxilla. Degenerative disc and facet disease changes of the cervical spine. Prior anterior and posterior fusion changes of the cervical  spine as above. No acute cervical spine abnormalities. Critical Value/emergent results were called by telephone at the time of interpretation on 07/07/2021 at 1145 hrs to provider [facility] VENTER PA, who verbally acknowledged these results. Electronically Signed   By: Lavonia Dana M.D.   On: 07/07/2021 11:47   CT Cervical Spine Wo Contrast  Result Date: 07/07/2021 CLINICAL DATA:  [place] today striking head, level 2 fall, on blood thinners, LEFT frontal laceration, uncertain loss of consciousness EXAM: CT HEAD WITHOUT CONTRAST CT MAXILLOFACIAL WITHOUT CONTRAST CT CERVICAL SPINE WITHOUT CONTRAST TECHNIQUE: Multidetector CT imaging of the head, cervical spine, and maxillofacial structures were performed using the standard protocol without intravenous contrast. Multiplanar CT image reconstructions of the cervical spine and maxillofacial structures were also generated. COMPARISON:  CT head 11/23/2010 FINDINGS: CT HEAD FINDINGS Brain: Generalized atrophy. Normal ventricular morphology. No midline shift or mass effect. Dural calcifications in falx. Scattered high attenuation acute hemorrhage identified extra-axial at anterior aspect of interhemispheric fissure, in BILATERAL frontal regions, and at anterior aspect of LEFT middle cranial fossa consistent with acute intracranial hemorrhage. Small vessel chronic ischemic changes of deep cerebral white matter. No intraparenchymal hemorrhage or infarction. No mass. Vascular: No hyperdense vessels. Atherosclerotic calcifications of internal carotid arteries at skull base Skull: Nondisplaced LEFT frontal bone fractures. Other: N/A CT MAXILLOFACIAL FINDINGS Osseous: TMJ alignment normal bilaterally. Nondisplaced fractures involving LEFT zygomatic arch, lateral and superolateral walls of the LEFT orbit, and LEFT frontal bone. LEFT frontal bone fracture extends obliquely to involve the LEFT frontal sinus. Fracture at medial wall LEFT orbit extending into base of LEFT nasal bone.  Questionable tiny fracture at anterior LEFT maxilla. Orbits: Intraorbital soft tissue planes clear. LEFT periorbital contusion and soft tissue swelling. Sinuses: Fluid air-fluid levels within the LEFT frontal, sphenoid, and BILATERAL maxillary sinuses. Middle ear cavities and mastoid air cells clear Soft tissues: Foci of soft tissue gas LEFT periorbital and at LEFT infratemporal fossa. CT CERVICAL SPINE FINDINGS Alignment: Normal Skull base and vertebrae: Prior anterior fusion C3-C4. Interval removal of prior anterior C4-C6 plate with intact bony fusion. Interval posterior fusion C3-C7. Disc space narrowing with endplate spur formation at C6-C7. Vertebral body heights maintained without acute fracture or subluxation. Scattered facet degenerative changes. Skull base intact. Soft tissues and spinal canal: Prevertebral soft tissues normal thickness. Atherosclerotic calcifications at carotid bifurcations bilaterally and at proximal great vessels. 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 Scattered acute extra-axial hemorrhage identified at anterior, interhemispheric, BILATERAL frontal, and anterior LEFT middle cranial fossa. No additional acute intracranial abnormalities. Nondisplaced LEFT frontal bone fractures. Nondisplaced fractures involving LEFT zygomatic arch, lateral and superolateral walls of the LEFT orbit, and LEFT frontal bone. Fracture at medial wall LEFT orbit extending into base of LEFT nasal bone. Questionable tiny transverse fracture at anterior LEFT maxilla. Degenerative disc and facet disease changes of the cervical spine. Prior anterior and posterior fusion changes of the cervical spine as above. No acute cervical spine abnormalities. Critical Value/emergent results were called by telephone at the time of interpretation on  07/07/2021 at 1145 hrs to  provider MARGAUX VENTER PA, who verbally acknowledged these results. Electronically Signed   By: Lavonia Dana M.D.   On: 07/07/2021 11:47   DG HIP UNILAT WITH PELVIS 2-3 VIEWS RIGHT  Result Date: 07/07/2021 CLINICAL DATA:  Right hip pain after fall today. EXAM: DG HIP (WITH OR WITHOUT PELVIS) 2-3V RIGHT COMPARISON:  None. FINDINGS: There is no evidence of hip fracture or dislocation. There is no evidence of arthropathy or other focal bone abnormality. IMPRESSION: Negative. Electronically Signed   By: Marijo Conception M.D.   On: 07/07/2021 11:43   CT Maxillofacial WO CM  Result Date: 07/07/2021 CLINICAL DATA:  [place] today striking head, level 2 fall, on blood thinners, LEFT frontal laceration, uncertain loss of consciousness EXAM: CT HEAD WITHOUT CONTRAST CT MAXILLOFACIAL WITHOUT CONTRAST CT CERVICAL SPINE WITHOUT CONTRAST TECHNIQUE: Multidetector CT imaging of the head, cervical spine, and maxillofacial structures were performed using the standard protocol without intravenous contrast. Multiplanar CT image reconstructions of the cervical spine and maxillofacial structures were also generated. COMPARISON:  CT head 11/23/2010 FINDINGS: CT HEAD FINDINGS Brain: Generalized atrophy. Normal ventricular morphology. No midline shift or mass effect. Dural calcifications in falx. Scattered high attenuation acute hemorrhage identified extra-axial at anterior aspect of interhemispheric fissure, in BILATERAL frontal regions, and at anterior aspect of LEFT middle cranial fossa consistent with acute intracranial hemorrhage. Small vessel chronic ischemic changes of deep cerebral white matter. No intraparenchymal hemorrhage or infarction. No mass. Vascular: No hyperdense vessels. Atherosclerotic calcifications of internal carotid arteries at skull base Skull: Nondisplaced LEFT frontal bone fractures. Other: N/A CT MAXILLOFACIAL FINDINGS Osseous: TMJ alignment normal bilaterally. Nondisplaced fractures involving LEFT zygomatic  arch, lateral and superolateral walls of the LEFT orbit, and LEFT frontal bone. LEFT frontal bone fracture extends obliquely to involve the LEFT frontal sinus. Fracture at medial wall LEFT orbit extending into base of LEFT nasal bone. Questionable tiny fracture at anterior LEFT maxilla. Orbits: Intraorbital soft tissue planes clear. LEFT periorbital contusion and soft tissue swelling. Sinuses: Fluid air-fluid levels within the LEFT frontal, sphenoid, and BILATERAL maxillary sinuses. Middle ear cavities and mastoid air cells clear Soft tissues: Foci of soft tissue gas LEFT periorbital and at LEFT infratemporal fossa. CT CERVICAL SPINE FINDINGS Alignment: Normal Skull base and vertebrae: Prior anterior fusion C3-C4. Interval removal of prior anterior C4-C6 plate with intact bony fusion. Interval posterior fusion C3-C7. Disc space narrowing with endplate spur formation at C6-C7. Vertebral body heights maintained without acute fracture or subluxation. Scattered facet degenerative changes. Skull base intact. Soft tissues and spinal canal: Prevertebral soft tissues normal thickness. Atherosclerotic calcifications at carotid bifurcations bilaterally and at proximal great vessels. 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 Scattered acute extra-axial hemorrhage identified at anterior, interhemispheric, BILATERAL frontal, and anterior LEFT middle cranial fossa. No additional acute intracranial abnormalities. Nondisplaced LEFT frontal bone fractures. Nondisplaced fractures involving LEFT zygomatic arch, lateral and superolateral walls of the LEFT orbit, and LEFT frontal bone. Fracture at medial wall LEFT orbit extending into base of LEFT nasal bone. Questionable tiny transverse fracture at anterior LEFT maxilla. 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 No acute cervical spine abnormalities. Critical Value/emergent results were called by telephone at the time of interpretation on 07/07/2021 at 1145 hrs to provider MARGAUX VENTER PA,  who verbally acknowledged these results. Electronically Signed   By: Lavonia Dana M.D.   On: 07/07/2021 11:47    LJQ:[id number] except as listed in admit H&P  Blood pressure (!) 147/63, pulse 71, temperature 99.3 F (37.4 C), temperature source Oral, resp. rate 18, SpO2 98 %.  PHYSICAL EXAM: Overall appearance:  Healthy appearing, in no distress Head:  Normocephalic, atraumatic. Eyes: Left eyelids are swollen.  When open the EOMs are intact and his vision is grossly intact.  There is subconjunctival hemorrhage. Ears: External ears look healthy. Nose: External nose is healthy in appearance. Internal nasal exam free of any lesions or obstruction. Oral Cavity/Pharynx:  There are no mucosal lesions or masses identified. Larynx/Hypopharynx: Deferred Neuro:  No identifiable neurologic deficits. Neck: No palpable neck masses.  Studies Reviewed: Maxillofacial CT  Procedures: none   Assessment/Plan: Multiple fractures involving the left skull, zygomatic arch, left orbit, left frontal bone, left maxilla and nasal bone.  All nondisplaced.  None of these require surgical intervention.  Avoid nose blowing for 3 weeks.  Follow-up as an outpatient if needed.   [id number] So2.Dina Rich Lorrin Bodner 07/07/2021, 6:14 PM

## 2021-07-08 ENCOUNTER — Inpatient Hospital Stay (HOSPITAL_COMMUNITY): Payer: Medicare Other

## 2021-07-08 DIAGNOSIS — I5030 Unspecified diastolic (congestive) heart failure: Secondary | ICD-10-CM | POA: Diagnosis not present

## 2021-07-08 DIAGNOSIS — I629 Nontraumatic intracranial hemorrhage, unspecified: Secondary | ICD-10-CM

## 2021-07-08 LAB — IRON AND TIBC
Iron: 388 ug/dL — ABNORMAL HIGH (ref 45–182)
Saturation Ratios: 84 % — ABNORMAL HIGH (ref 17.9–39.5)
TIBC: 459 ug/dL — ABNORMAL HIGH (ref 250–450)
UIBC: 71 ug/dL

## 2021-07-08 LAB — TYPE AND SCREEN
ABO/RH(D): O POS
Antibody Screen: NEGATIVE
Unit division: 0
Unit division: 0

## 2021-07-08 LAB — ECHOCARDIOGRAM COMPLETE
Area-P 1/2: 2.11 cm2
Calc EF: 44.6 %
Radius: 0.2 cm
S' Lateral: 4 cm
Single Plane A2C EF: 47.4 %
Single Plane A4C EF: 41.3 %

## 2021-07-08 LAB — MAGNESIUM: Magnesium: 2.5 mg/dL — ABNORMAL HIGH (ref 1.7–2.4)

## 2021-07-08 LAB — BPAM RBC
Blood Product Expiration Date: 202208182359
Blood Product Expiration Date: 202208182359
ISSUE DATE / TIME: 202207181445
ISSUE DATE / TIME: 202207181811
Unit Type and Rh: 5100
Unit Type and Rh: 5100

## 2021-07-08 LAB — HEMOGLOBIN AND HEMATOCRIT, BLOOD
HCT: 28.9 % — ABNORMAL LOW (ref 39.0–52.0)
Hemoglobin: 8.9 g/dL — ABNORMAL LOW (ref 13.0–17.0)

## 2021-07-08 LAB — T4, FREE: Free T4: 0.78 ng/dL (ref 0.61–1.12)

## 2021-07-08 LAB — TSH: TSH: 1.26 u[IU]/mL (ref 0.350–4.500)

## 2021-07-08 MED ORDER — SODIUM CHLORIDE 0.9 % IV SOLN
INTRAVENOUS | Status: AC
Start: 2021-07-08 — End: 2021-07-09

## 2021-07-08 NOTE — Progress Notes (Signed)
PROGRESS NOTE    Russell [name]  QBH:[id number] DOB: 07-17-1941 DOA: 07/07/2021 PCP: Marcie Mowers, FNP    Brief Narrative:  80 y.o. male with medical history significant of poorly controlled BPH, PVD on aspirin and Plavix, HTN, CKD stage II, peripheral neuropathy on gabapentin, chronic diastolic CHF, chronic iron deficiency anemia, presented with fall and multiple skin tearing, bleeding anemia and intracranial bleed. Imaging with multiple facial fractures. Pt has since been seen by ENT and Neurosurgery  Assessment & Plan:   Active Problems:   Anemia   Lower urinary tract infectious disease   Intracranial bleed (HCC)   Orthostatic hypotension Orthostatic vitals reviewed. Noted to have 27mm drop in SBP from sitting to standing Mucus membranes appear dry in the setting of ARF Will give limited course of IVF  Of note, pt is on flomax prior to admit which can be associated with orthostasis Fall with head trauma and ICH Seen by ENT and Neurosurgery Outpt f/u per ENT noted Repeat CT head reviewed, unchanged Neurosurgery following UTI at time of presentation UA suggestive of UTI On empiric rocephin Acute blood loss anemia Presenting hgb of 6.5  Pt is now s/p 2 units PRBC's Will check hemacult Chart reviewed. Pt is known to [facility], reportedly had unremarkable colonoscopy in 2008 and EGD in 2009 revealing gastritis. Both studies were done to w/u microcytic anemia F/u on occult stool. If pt is found to have blood in stool, would then consult [facility] for assistance Repeat CBC in AM Microcytic anemia Per above, f/u on occult stool Will f/u on iron studies Chronic diastolic CHF Appeared euvolemic to slightly dry this AM with dry appearing mucus membranes In light of orthostasis, will give trial of IVF x 8hrs Acute on CKD2 present on admission Cr is up to 2.1 with baseline around 1.5-1.6 Hold Losartan Give limited quantity of IVF Repeat bmet in AM Peripheral  Neuropathy Continued on gapabentin HTN BP stable and controlled at this time Per above, losartan on hold secondary to ARF  DVT prophylaxis: SCD's Code Status: Full Family Communication: Pt in room, family not at bedside  Status is: Inpatient  Remains inpatient appropriate because:Inpatient level of care appropriate due to severity of illness  Dispo: The patient is from: Home              Anticipated d/c is to: Home              Patient currently is not medically stable to d/c.   Difficult to place patient No       Consultants:  Neurosurgery ENT  Procedures:    Antimicrobials: Anti-infectives (From admission, onward)    Start     Dose/Rate Route Frequency Ordered Stop   07/07/21 1600  cefTRIAXone (ROCEPHIN) 1 g in sodium chloride 0.9 % 100 mL IVPB        1 g 200 mL/hr over 30 Minutes Intravenous Every 24 hours 07/07/21 1520     07/07/21 1200  ceFAZolin (ANCEF) IVPB 2g/100 mL premix        2 g 200 mL/hr over 30 Minutes Intravenous STAT 07/07/21 1150 07/07/21 1331       Subjective: Feeling tired this AM  Objective: Vitals:   07/08/21 0230 07/08/21 0343 07/08/21 0721 07/08/21 1130  BP: (!) 153/82 (!) 154/61 (!) 145/57 128/75  Pulse: 63 64 (!) 56 72  Resp: 16 17    Temp: 98.8 F (37.1 C) 98.9 F (37.2 C) 98.1 F (36.7 C) 99.4 F (37.4  C)  TempSrc: Oral Oral Oral Oral  SpO2: 99%  100% 100%    Intake/Output Summary (Last 24 hours) at 07/08/2021 1452 Last data filed at 07/07/2021 1957 Gross per 24 hour  Intake 804 ml  Output 1400 ml  Net -596 ml   There were no vitals filed for this visit.  Examination: General exam: Awake, laying in bed, in nad Respiratory system: Normal respiratory effort, no wheezing Cardiovascular system: regular rate, s1, s2 Gastrointestinal system: Soft, nondistended, positive BS Central nervous system: CN2-12 grossly intact, strength intact Extremities: Perfused, no clubbing Skin: Normal skin turgor, no notable skin lesions  seen Psychiatry: Mood normal // no visual hallucinations   Data Reviewed: I have personally reviewed following labs and imaging studies  CBC: Recent Labs  Lab 07/07/21 1153 07/07/21 1205 07/07/21 2337  WBC 13.9*  --   --   NEUTROABS 12.1*  --   --   HGB 6.5* 7.1* 8.9*  HCT 22.5* 21.0* 28.9*  MCV 78.9*  --   --   PLT PLATELET CLUMPS NOTED ON SMEAR, UNABLE TO ESTIMATE  --   --    Basic Metabolic Panel: Recent Labs  Lab 07/07/21 1153 07/07/21 1205 07/08/21 0406  NA 138 139  --   K 4.3 4.3  --   CL 105 109  --   CO2 22  --   --   GLUCOSE 120* 114*  --   BUN 28* 30*  --   CREATININE 1.97* 2.10*  --   CALCIUM 9.3  --   --   MG  --   --  2.5*   GFR: CrCl cannot be calculated (Unknown ideal weight.). Liver Function Tests: No results for input(s): AST, ALT, ALKPHOS, BILITOT, PROT, ALBUMIN in the last 168 hours. No results for input(s): LIPASE, AMYLASE in the last 168 hours. No results for input(s): AMMONIA in the last 168 hours. Coagulation Profile: No results for input(s): INR, PROTIME in the last 168 hours. Cardiac Enzymes: No results for input(s): CKTOTAL, CKMB, CKMBINDEX, TROPONINI in the last 168 hours. BNP (last 3 results) No results for input(s): PROBNP in the last 8760 hours. HbA1C: No results for input(s): HGBA1C in the last 72 hours. CBG: Recent Labs  Lab 07/07/21 1021  GLUCAP 119*   Lipid Profile: No results for input(s): CHOL, HDL, LDLCALC, TRIG, CHOLHDL, LDLDIRECT in the last 72 hours. Thyroid Function Tests: Recent Labs    07/08/21 0406  TSH 1.260  FREET4 0.78   Anemia Panel: No results for input(s): VITAMINB12, FOLATE, FERRITIN, TIBC, IRON, RETICCTPCT in the last 72 hours. Sepsis Labs: No results for input(s): PROCALCITON, LATICACIDVEN in the last 168 hours.  Recent Results (from the past 240 hour(s))  Resp Panel by RT-PCR (Flu A&B, Covid) Nasopharyngeal Swab     Status: None   Collection Time: 07/07/21 12:10 PM   Specimen: Nasopharyngeal  Swab; Nasopharyngeal(NP) swabs in vial transport medium  Result Value Ref Range Status   SARS Coronavirus 2 by RT PCR NEGATIVE NEGATIVE Final    Comment: (NOTE) SARS-CoV-2 target nucleic acids are NOT DETECTED.  The SARS-CoV-2 RNA is generally detectable in upper respiratory specimens during the acute phase of infection. The lowest concentration of SARS-CoV-2 viral copies this assay can detect is 138 copies/mL. A negative result does not preclude SARS-Cov-2 infection and should not be used as the sole basis for treatment or other patient management decisions. A negative result may occur with  improper specimen collection/handling, submission of specimen other than nasopharyngeal swab, presence  of viral mutation(s) within the areas targeted by this assay, and inadequate number of viral copies(<138 copies/mL). A negative result must be combined with clinical observations, patient history, and epidemiological information. The expected result is Negative.  Fact Sheet for Patients:  EntrepreneurPulse.com.au  Fact Sheet for Healthcare Providers:  IncredibleEmployment.be  This test is no t yet approved or cleared by the [place] FDA and  has been authorized for detection and/or diagnosis of SARS-CoV-2 by FDA under an Emergency Use Authorization (EUA). This EUA will remain  in effect (meaning this test can be used) for the duration of the COVID-19 declaration under Section 564(b)(1) of the Act, 21 U.S.C.section 360bbb-3(b)(1), unless the authorization is terminated  or revoked sooner.       Influenza A by PCR NEGATIVE NEGATIVE Final   Influenza B by PCR NEGATIVE NEGATIVE Final    Comment: (NOTE) The Xpert Xpress SARS-CoV-2/FLU/RSV plus assay is intended as an aid in the diagnosis of influenza from Nasopharyngeal swab specimens and should not be used as a sole basis for treatment. Nasal washings and aspirates are unacceptable for Xpert Xpress  SARS-CoV-2/FLU/RSV testing.  Fact Sheet for Patients: EntrepreneurPulse.com.au  Fact Sheet for Healthcare Providers: IncredibleEmployment.be  This test is not yet approved or cleared by the [place] FDA and has been authorized for detection and/or diagnosis of SARS-CoV-2 by FDA under an Emergency Use Authorization (EUA). This EUA will remain in effect (meaning this test can be used) for the duration of the COVID-19 declaration under Section 564(b)(1) of the Act, 21 U.S.C. section 360bbb-3(b)(1), unless the authorization is terminated or revoked.  Performed at [facility], [employer] [address], [place], [place] [address]      Radiology Studies: DG ELBOW COMPLETE RIGHT (3+VIEW)  Result Date: 07/07/2021 CLINICAL DATA:  Fall and altered mental status EXAM: RIGHT ELBOW - COMPLETE 3+ VIEW COMPARISON:  None. FINDINGS: There is no evidence of acute fracture. The elbow is held in flexion resulting in a suboptimal lateral view, however there is no evidence of displaced posterior fat pad to suggest a joint effusion. There is mild ulnar trochlear joint degenerative change. IMPRESSION: No evidence of acute fracture. Electronically Signed   By: Maurine Simmering   On: 07/07/2021 13:58   CT HEAD WO CONTRAST  Result Date: 07/08/2021 CLINICAL DATA:  Intracranial hemorrhage follow-up EXAM: CT HEAD WITHOUT CONTRAST TECHNIQUE: Contiguous axial images were obtained from the base of the skull through the vertex without intravenous contrast. COMPARISON:  07/07/2021 FINDINGS: Brain: Multifocal acute extra-axial hemorrhage is unchanged. No midline shift or other mass effect. Mild generalized volume loss. There is periventricular hypoattenuation compatible with chronic microvascular disease. Vascular: No abnormal hyperdensity of the major intracranial arteries or dural venous sinuses. No intracranial atherosclerosis. Skull: Multiple facial and anterior skull base fractures are  unchanged. Sinuses/Orbits: No fluid levels or advanced mucosal thickening of the visualized paranasal sinuses. No mastoid or middle ear effusion. The orbits are normal. IMPRESSION: 1. Unchanged multifocal acute extra-axial hemorrhage. 2. Unchanged multiple facial and anterior skull base fractures. Electronically Signed   By: Ulyses Jarred M.D.   On: 07/08/2021 03:26   CT Head Wo Contrast  Result Date: 07/07/2021 CLINICAL DATA:  [place] today striking head, level 2 fall, on blood thinners, LEFT frontal laceration, uncertain loss of consciousness EXAM: CT HEAD WITHOUT CONTRAST CT MAXILLOFACIAL WITHOUT CONTRAST CT CERVICAL SPINE WITHOUT CONTRAST TECHNIQUE: Multidetector CT imaging of the head, cervical spine, and maxillofacial structures were performed using the standard protocol without intravenous contrast. Multiplanar  CT image reconstructions of the cervical spine and maxillofacial structures were also generated. COMPARISON:  CT head 11/23/2010 FINDINGS: CT HEAD FINDINGS Brain: Generalized atrophy. Normal ventricular morphology. No midline shift or mass effect. Dural calcifications in falx. Scattered high attenuation acute hemorrhage identified extra-axial at anterior aspect of interhemispheric fissure, in BILATERAL frontal regions, and at anterior aspect of LEFT middle cranial fossa consistent with acute intracranial hemorrhage. Small vessel chronic ischemic changes of deep cerebral white matter. No intraparenchymal hemorrhage or infarction. No mass. Vascular: No hyperdense vessels. Atherosclerotic calcifications of internal carotid arteries at skull base Skull: Nondisplaced LEFT frontal bone fractures. Other: N/A CT MAXILLOFACIAL FINDINGS Osseous: TMJ alignment normal bilaterally. Nondisplaced fractures involving LEFT zygomatic arch, lateral and superolateral walls of the LEFT orbit, and LEFT frontal bone. LEFT frontal bone fracture extends obliquely to involve the LEFT frontal sinus. Fracture at medial wall LEFT  orbit extending into base of LEFT nasal bone. Questionable tiny fracture at anterior LEFT maxilla. Orbits: Intraorbital soft tissue planes clear. LEFT periorbital contusion and soft tissue swelling. Sinuses: Fluid air-fluid levels within the LEFT frontal, sphenoid, and BILATERAL maxillary sinuses. Middle ear cavities and mastoid air cells clear Soft tissues: Foci of soft tissue gas LEFT periorbital and at LEFT infratemporal fossa. CT CERVICAL SPINE FINDINGS Alignment: Normal Skull base and vertebrae: Prior anterior fusion C3-C4. Interval removal of prior anterior C4-C6 plate with intact bony fusion. Interval posterior fusion C3-C7. Disc space narrowing with endplate spur formation at C6-C7. Vertebral body heights maintained without acute fracture or subluxation. Scattered facet degenerative changes. Skull base intact. Soft tissues and spinal canal: Prevertebral soft tissues normal thickness. Atherosclerotic calcifications at carotid bifurcations bilaterally and at proximal great vessels. 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 Scattered acute extra-axial hemorrhage identified at anterior, interhemispheric, BILATERAL frontal, and anterior LEFT middle cranial fossa. No additional acute intracranial abnormalities. Nondisplaced LEFT frontal bone fractures. Nondisplaced fractures involving LEFT zygomatic arch, lateral and superolateral walls of the LEFT orbit, and LEFT frontal bone. Fracture at medial wall LEFT orbit extending into base of LEFT nasal bone. Questionable tiny transverse fracture at anterior LEFT maxilla. Degenerative disc and facet disease changes of the cervical spine. Prior anterior and posterior fusion changes of the cervical spine as above. No acute cervical spine abnormalities. Critical Value/emergent results were called by telephone at the time of  interpretation on 07/07/2021 at 1145 hrs to provider [facility] VENTER PA, who verbally acknowledged these results. Electronically Signed   By: Lavonia Dana M.D.   On: 07/07/2021 11:47   CT Cervical Spine Wo Contrast  Result Date: 07/07/2021 CLINICAL DATA:  [place] today striking head, level 2 fall, on blood thinners, LEFT frontal laceration, uncertain loss of consciousness EXAM: CT HEAD WITHOUT CONTRAST CT MAXILLOFACIAL WITHOUT CONTRAST CT CERVICAL SPINE WITHOUT CONTRAST TECHNIQUE: Multidetector CT imaging of the head, cervical spine, and maxillofacial structures were performed using the standard protocol without intravenous contrast. Multiplanar CT image reconstructions of the cervical spine and maxillofacial structures were also generated. COMPARISON:  CT head 11/23/2010 FINDINGS: CT HEAD FINDINGS Brain: Generalized atrophy. Normal ventricular morphology. No midline shift or mass effect. Dural calcifications in falx. Scattered high attenuation acute hemorrhage identified extra-axial at anterior aspect of interhemispheric fissure, in BILATERAL frontal regions, and at anterior aspect of LEFT middle cranial fossa consistent with acute intracranial hemorrhage. Small vessel chronic ischemic changes of deep cerebral white matter. No intraparenchymal hemorrhage  or infarction. No mass. Vascular: No hyperdense vessels. Atherosclerotic calcifications of internal carotid arteries at skull base Skull: Nondisplaced LEFT frontal bone fractures. Other: N/A CT MAXILLOFACIAL FINDINGS Osseous: TMJ alignment normal bilaterally. Nondisplaced fractures involving LEFT zygomatic arch, lateral and superolateral walls of the LEFT orbit, and LEFT frontal bone. LEFT frontal bone fracture extends obliquely to involve the LEFT frontal sinus. Fracture at medial wall LEFT orbit extending into base of LEFT nasal bone. Questionable tiny fracture at anterior LEFT maxilla. Orbits: Intraorbital soft tissue planes clear. LEFT periorbital contusion and  soft tissue swelling. Sinuses: Fluid air-fluid levels within the LEFT frontal, sphenoid, and BILATERAL maxillary sinuses. Middle ear cavities and mastoid air cells clear Soft tissues: Foci of soft tissue gas LEFT periorbital and at LEFT infratemporal fossa. CT CERVICAL SPINE FINDINGS Alignment: Normal Skull base and vertebrae: Prior anterior fusion C3-C4. Interval removal of prior anterior C4-C6 plate with intact bony fusion. Interval posterior fusion C3-C7. Disc space narrowing with endplate spur formation at C6-C7. Vertebral body heights maintained without acute fracture or subluxation. Scattered facet degenerative changes. Skull base intact. Soft tissues and spinal canal: Prevertebral soft tissues normal thickness. Atherosclerotic calcifications at carotid bifurcations bilaterally and at proximal great vessels. 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 Scattered acute extra-axial hemorrhage identified at anterior, interhemispheric, BILATERAL frontal, and anterior LEFT middle cranial fossa. No additional acute intracranial abnormalities. Nondisplaced LEFT frontal bone fractures. Nondisplaced fractures involving LEFT zygomatic arch, lateral and superolateral walls of the LEFT orbit, and LEFT frontal bone. Fracture at medial wall LEFT orbit extending into base of LEFT nasal bone. Questionable tiny transverse fracture at anterior LEFT maxilla. Degenerative disc and facet disease changes of the cervical spine. Prior anterior and posterior fusion changes of the cervical spine as above. No acute cervical spine abnormalities. Critical Value/emergent results were called by telephone at the time of interpretation on 07/07/2021 at 1145 hrs to provider [facility] VENTER PA, who verbally acknowledged these results. Electronically Signed   By: Lavonia Dana M.D.   On: 07/07/2021  11:47   DG HIP UNILAT WITH PELVIS 2-3 VIEWS RIGHT  Result Date: 07/07/2021 CLINICAL DATA:  Right hip pain after fall today. EXAM: DG HIP (WITH OR WITHOUT PELVIS) 2-3V RIGHT COMPARISON:  None. FINDINGS: There is no evidence of hip fracture or dislocation. There is no evidence of arthropathy or other focal bone abnormality. IMPRESSION: Negative. Electronically Signed   By: Marijo Conception M.D.   On: 07/07/2021 11:43   CT Maxillofacial WO CM  Result Date: 07/07/2021 CLINICAL DATA:  [place] today striking head, level 2 fall, on blood thinners, LEFT frontal laceration, uncertain loss of consciousness EXAM: CT HEAD WITHOUT CONTRAST CT MAXILLOFACIAL WITHOUT CONTRAST CT CERVICAL SPINE WITHOUT CONTRAST TECHNIQUE: Multidetector CT imaging of the head, cervical spine, and maxillofacial structures were performed using the standard protocol without intravenous contrast. Multiplanar CT image reconstructions of the cervical spine and maxillofacial structures were also generated. COMPARISON:  CT head 11/23/2010 FINDINGS: CT HEAD FINDINGS Brain: Generalized atrophy. Normal ventricular morphology. No midline shift or mass effect. Dural calcifications in falx. Scattered high attenuation acute hemorrhage identified extra-axial at anterior aspect of interhemispheric fissure, in BILATERAL frontal regions, and at anterior aspect of LEFT middle cranial fossa consistent with acute intracranial hemorrhage. Small vessel chronic ischemic changes of deep cerebral white matter. No intraparenchymal hemorrhage or infarction. No mass. Vascular: No hyperdense vessels. Atherosclerotic calcifications  of internal carotid arteries at skull base Skull: Nondisplaced LEFT frontal bone fractures. Other: N/A CT MAXILLOFACIAL FINDINGS Osseous: TMJ alignment normal bilaterally. Nondisplaced fractures involving LEFT zygomatic arch, lateral and superolateral walls of the LEFT orbit, and LEFT frontal bone. LEFT frontal bone fracture extends obliquely to  involve the LEFT frontal sinus. Fracture at medial wall LEFT orbit extending into base of LEFT nasal bone. Questionable tiny fracture at anterior LEFT maxilla. Orbits: Intraorbital soft tissue planes clear. LEFT periorbital contusion and soft tissue swelling. Sinuses: Fluid air-fluid levels within the LEFT frontal, sphenoid, and BILATERAL maxillary sinuses. Middle ear cavities and mastoid air cells clear Soft tissues: Foci of soft tissue gas LEFT periorbital and at LEFT infratemporal fossa. CT CERVICAL SPINE FINDINGS Alignment: Normal Skull base and vertebrae: Prior anterior fusion C3-C4. Interval removal of prior anterior C4-C6 plate with intact bony fusion. Interval posterior fusion C3-C7. Disc space narrowing with endplate spur formation at C6-C7. Vertebral body heights maintained without acute fracture or subluxation. Scattered facet degenerative changes. Skull base intact. Soft tissues and spinal canal: Prevertebral soft tissues normal thickness. Atherosclerotic calcifications at carotid bifurcations bilaterally and at proximal great vessels. 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 Scattered acute extra-axial hemorrhage identified at anterior, interhemispheric, BILATERAL frontal, and anterior LEFT middle cranial fossa. No additional acute intracranial abnormalities. Nondisplaced LEFT frontal bone fractures. Nondisplaced fractures involving LEFT zygomatic arch, lateral and superolateral walls of the LEFT orbit, and LEFT frontal bone. Fracture at medial wall LEFT orbit extending into base of LEFT nasal bone. Questionable tiny transverse fracture at anterior LEFT maxilla. Degenerative disc and facet disease changes of the cervical spine. Prior anterior and posterior fusion changes of the cervical spine as above. No acute cervical spine abnormalities. Critical  Value/emergent results were called by telephone at the time of interpretation on 07/07/2021 at 1145 hrs to provider [facility] VENTER PA, who verbally acknowledged these results. Electronically Signed   By: Lavonia Dana M.D.   On: 07/07/2021 11:47    Scheduled Meds:   stroke: mapping our early stages of recovery book   Does not apply Once   amitriptyline  25 mg Oral QHS   brimonidine  1 drop Both Eyes TID   brinzolamide  1 drop Both Eyes TID   ferrous sulfate  325 mg Oral Daily   gabapentin  300 mg Oral Daily   latanoprost  1 drop Both Eyes QHS   lidocaine-EPINEPHrine  10 mL Infiltration Once   losartan  100 mg Oral Daily   metoprolol tartrate  12.5 mg Oral BID   mirabegron ER  25 mg Oral Daily   oxybutynin  5 mg Oral q morning   pravastatin  80 mg Oral Daily   tamsulosin  0.4 mg Oral QHS   Continuous Infusions:  cefTRIAXone (ROCEPHIN)  IV Stopped (07/07/21 1857)     LOS: 1 day   Marylu Lund, MD [facility] Pager On Amion  If 7PM-7AM, please contact night-coverage 07/08/2021, 2:52 PM

## 2021-07-08 NOTE — Evaluation (Signed)
Speech Language Pathology Evaluation Patient Details Name: Russell [name] MRN: [id number] DOB: 1941/06/04 Today's Date: 07/08/2021 Time:  -     Problem List:  Patient Active Problem List   Diagnosis Date Noted   Intracranial bleed ([place]) 07/07/2021   Fall    Traumatic hemorrhage of cerebrum without loss of consciousness (HCC)    Open extensive facial fractures ([place])    Osteoarthritis 05/31/2018   Foot pain, left 05/11/2018   Chest pain 06/23/2017   AKI (acute kidney injury) ([place]) 06/23/2017   Acute respiratory insufficiency    Surgery, elective    Radiculopathy 01/27/2017   Cervical radiculopathy 01/27/2017   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   Peripheral arterial disease ([place]) 01/23/2016   SLAC (scapholunate advanced collapse) of wrist 11/13/2015   Fever 02/22/2015   Epididymitis, left 02/22/2015   Cellulitis 02/22/2015   Orchitis 02/22/2015   Testicular abscess 02/22/2015   DM type 2 (diabetes mellitus, type 2) ([place]) 02/22/2015   Anemia 02/22/2015   Epididymitis 02/22/2015   Lower urinary tract infectious disease 02/22/2015   Hypotension 03/22/2014   Neuroma digital nerve 09/28/2013   Neuroma, 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   SEIZURE, 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   Dehydration 07/03/2010   NAUSEA AND VOMITING 07/03/2010   BENIGN PROSTATIC HYPERTROPHY, WITH URINARY OBSTRUCTION 06/13/2010   ARM PAIN 06/03/2010   DYSPNEA ON EXERTION 05/15/2010   TOBACCO ABUSE 04/03/2010   COPD 04/03/2010   G E R D 04/03/2010   OTHER DYSPHAGIA 04/03/2010   PNEUMONIA, RIGHT LOWER LOBE 02/19/2010   DIABETES MELLITUS, TYPE II, CONTROLLED 01/14/2010   LUMBAR RADICULOPATHY, LEFT 05/16/2009   DYSPHAGIA UNSPECIFIED 05/16/2009   COUGH DUE TO ACE INHIBITORS 04/02/2009   DIZZINESS 07/27/2008   HYPERCHOLESTEROLEMIA, MIXED 05/11/2008   CATARACTS, BILATERAL 03/01/2008   MILD SPINAL STENOSIS, CERVICAL 01/12/2008   LEG CRAMPS 01/12/2008    EARLY SATIETY 11/04/2007   DIVERTICULOSIS, COLON 08/31/2007   SCABIES 08/26/2007   ERECTILE DYSFUNCTION 08/18/2007   ABUSE, ALCOHOL SUSPECTED 08/18/2007   Essential hypertension 08/17/2007   HEMOCCULT POSITIVE STOOL 07/05/2007   ANEMIA, IRON DEFICIENCY NOS 05/31/2007   WEIGHT LOSS, ABNORMAL 05/10/2006   Past Medical History:  Past Medical History:  Diagnosis Date   AKI (acute kidney injury) ([place]) 06/23/2017   Arthritis    SHOUDLERS, LUMBAR   BPH with obstruction/lower urinary tract symptoms    BPH with urinary obstruction 01/15/2016   COPD (chronic obstructive pulmonary disease) ([place])    Coronary atherosclerosis    denied knowledge of this 10/27/16   Depression    Diverticulosis of colon    Dyspnea    ED (erectile dysfunction)    Full dentures    GERD (gastroesophageal reflux disease)    not current   Glaucoma    History of gastritis    History of scabies    2008   History of seizure    2013-- x2   idiopathic --  none since   History of syncope    03-22-2014  DX  VAGAL RESPONSE   Hyperlipidemia, mixed    Hypertension    Mitral regurgitation    Peripheral arterial disease ([place])    one vessel runoff bilaterally via peroneal arteries   Prostate cancer ([place])    Seizures ([place]) 03/2014   ? or syncope- patient refused to be admitted for further studies- "felt better"   Type 2 diabetes, diet controlled ([place])    patient and his wife said no- have not been told  01/21/17   Wears glasses    Past Surgical History:  Past Surgical History:  Procedure Laterality Date   ABDOMINAL AORTOGRAM W/LOWER EXTREMITY Left 12/18/2020   Procedure: ABDOMINAL AORTOGRAM W/LOWER EXTREMITY;  Surgeon: Cherre Robins, MD;  Location: [facility];  Service: Cardiovascular;  Laterality: Left;   ABDOMINAL AORTOGRAM W/LOWER EXTREMITY N/A 02/26/2021   Procedure: ABDOMINAL AORTOGRAM W/LOWER EXTREMITY;  Surgeon: Cherre Robins, MD;  Location: [place] [facility];  Service: Cardiovascular;  Laterality:  N/A;   ANTERIOR CERVICAL DECOMP/DISCECTOMY FUSION  10-16-2009   C4 -- C6   ANTERIOR CERVICAL DECOMP/DISCECTOMY FUSION N/A 01/27/2017   Procedure: ANTERIOR CERVICAL DECOMPRESSION FUSION CERVICAL 3-4 WITH INSTRUMENTATION AND ALLOGRAFT;  Surgeon: Phylliss Bob, MD;  Location: [facility];  Service: Orthopedics;  Laterality: N/A;  ANTERIOR CERVICAL DECOMPRESSION FUSION CERVICAL 3-4 WITH INSTRUMENTATION AND ALLOGRAFT   CAPSULOTOMY Right 01/26/2013   Procedure: MINOR CAPSULOTOMY;  Surgeon: Myrtha Mantis., MD;  Location: [facility];  Service: Ophthalmology;  Laterality: Right;   CARPAL TUNNEL RELEASE Left 01/02/2019   Procedure: LEFT CARPAL TUNNEL RELEASE;  Surgeon: Leanora Cover, MD;  Location: [facility];  Service: Orthopedics;  Laterality: Left;   CARPECTOMY Right 03/13/2014   Procedure: RIGHT  PROXIMAL ROW CARPECTOMY;  Surgeon: Tennis Must, MD;  Location: [place];  Service: Orthopedics;  Laterality: Right;   CARPECTOMY Left 10/22/2015   Procedure: LEFT WRIST PROXIMAL ROW CARPECTOMY ;  Surgeon: Leanora Cover, MD;  Location: [facility];  Service: Orthopedics;  Laterality: Left;   CARPOMETACARPAL ([place]) FUSION OF THUMB Right 03/13/2014   Procedure: RIGHT FUSION OF THUMB CARPOMETACARPAL [facility]) JOINT;  Surgeon: Tennis Must, MD;  Location: [place];  Service: Orthopedics;  Laterality: Right;   CARPOMETACARPEL SUSPENSION PLASTY Left 04/04/2020   Procedure: LEFT THUMB TRAPECIECTOMY AND SUSPENSIONPLASTY;  Surgeon: Leanora Cover, MD;  Location: [place];  Service: Orthopedics;  Laterality: Left;   CATARACT EXTRACTION W/ INTRAOCULAR LENS  IMPLANT, BILATERAL  2009   COLONOSCOPY  08-31-2007   COLONOSCOPY     CRYOABLATION N/A 01/14/2015   Procedure: CRYO ABLATION PROSTATE;  Surgeon: Ailene Rud, MD;  Location: [facility];  Service: Urology;  Laterality: N/A;   ESOPHAGOGASTRODUODENOSCOPY  last one 09-11-2008   EXCISION  MASS LEFT FACE , SUBORBITAL AREA, PLASTER RECONSTRUCTION  02-09-2011   EXCISIONAL DEBRIDEMENT AND REPAIR RIGHT QUADRICEP TENDON  07-05-2000   FOOT SURGERY Left    I & D EXTREMITY Right 05/24/2013   Procedure: RIGHT INDEX WOUND DEBRIDEMENT AND CLOSURE;  Surgeon: Jolyn Nap, MD;  Location: [place];  Service: Orthopedics;  Laterality: Right;   INSERTION OF SUPRAPUBIC CATHETER N/A 01/14/2015   Procedure: SUPRAPUBIC TUBE PLACEMENT;  Surgeon: Ailene Rud, MD;  Location: [facility];  Service: Urology;  Laterality: N/A;   KNEE ARTHROSCOPY Right 2008   LARYNGOSCOPY AND ESOPHAGOSCOPY  06-13-2010   MARSUPIALIZATION LEFT LARGE VALLECULAR CYST   MASS EXCISION Left 10/22/2015   Procedure: EXCISION MASS OF LEFT INDEX FINGER;  Surgeon: Leanora Cover, MD;  Location: [place];  Service: Orthopedics;  Laterality: Left;   ORCHIECTOMY Left 02/24/2015   Procedure: SCROTAL EXPLORATION WITH LEFT ORCHIECTOMY;  Surgeon: Kathie Rhodes, MD;  Location: [place];  Service: Urology;  Laterality: Left;   PERIPHERAL VASCULAR INTERVENTION Left 02/26/2021   Procedure: PERIPHERAL VASCULAR INTERVENTION;  Surgeon: Cherre Robins, MD;  Location: [place];  Service: Cardiovascular;  Laterality: Left;  Superficial femoral   POSTERIOR CERVICAL FUSION/FORAMINOTOMY N/A 01/28/2017   Procedure: POSTERIOR SPINAL FUSION CERVICAL 3-4, CERVICAL 4-5, CERVICAL 5-6, CERVICAL 6-7 WITH INSTRUMENATION AND ALLOGRAFT;  Surgeon: Phylliss Bob, MD;  Location: [place];  Service: Orthopedics;  Laterality: N/A;  POSTERIOR SPINAL FUSION CERVICAL 3-4, CERVICAL 4-5, CERVICAL 5-6, CERVICAL 6-7 WITH INSTRUMENATION AND ALLOGRAFT   SHOULDER ARTHROSCOPY/ DEBRIDEMENT LABRAL TEAR/  BICEPS TENOTOMY Left 09-24-2011   TONSILLECTOMY  as child   TRANSTHORACIC ECHOCARDIOGRAM  02-21-2010   normal LVF/  ef 55-60%/ mild to moderate MR/  moderate LAE/  mild TR   YAG LASER APPLICATION Right 05/23/9475   Procedure: YAG  LASER APPLICATION;  Surgeon: Myrtha Mantis., MD;  Location: [facility];  Service: Ophthalmology;  Laterality: Right;   YAG LASER CAPSULOTOMY, LEFT EYE  01-08-2011   HPI:  Russell [name] is a 80 y.o. male presetning after a fall with head trauma. Pt with multiple skin tearing, bleeding anemia and intracranial bleed. Had positive for small scattered extra-axetil hemorrhages, anterior, interhemispheric, bilateral frontal and anterior left middle cranial fossa. CT head also showed multiple fractures involving her left skull, zygomatic arch, left orbital left frontal bone, left maxillary and nasal bone. medical history significant of poorly controlled BPH, PVD on aspirin and Plavix, HTN, CKD stage II, peripheral neuropathy on gabapentin, chronic diastolic CHF, chronic iron deficiency anemia   Assessment / Plan / Recommendation Clinical Impression  Pt demonstrates lethargy and reluctance to participate making full assessment of cognition difficult. Pt responsive to basic biographical questions with appropriate Y/N response and some short linguaitically approrpriate phrases. When asked orientation questions or more complex questions required sustained attention, pt would not respond. When asked to explain what happened to him, pt only replied, I dont know. He refused assistance in calling his wife or sister. Suspect basic language and speech are in tact, but higher level cognition impacted by pain and decreased attention. Will continue efforts to assess when pt is more alert.    SLP Assessment  SLP Recommendation/Assessment: Patient needs continued Speech Lanaguage Pathology Services SLP Visit Diagnosis: Cognitive communication deficit (R41.841)    Follow Up Recommendations  24 hour supervision/assistance;Home health SLP    Frequency and Duration min 2x/week  2 weeks      SLP Evaluation Cognition  Overall Cognitive Status: Difficult to assess Arousal/Alertness: Lethargic Orientation Level:  Oriented to person;Disoriented to time;Disoriented to situation;Disoriented to place Attention: Focused Focused Attention: Appears intact Memory: Impaired Memory Impairment: Decreased short term memory Decreased Short Term Memory: Verbal basic Awareness: Impaired Awareness Impairment: Intellectual impairment       Comprehension  Auditory Comprehension Overall Auditory Comprehension: Appears within functional limits for tasks assessed    Expression Verbal Expression Overall Verbal Expression: Appears within functional limits for tasks assessed   Oral / Motor  Oral Motor/Sensory Function Overall Oral Motor/Sensory Function: Within functional limits Motor Speech Overall Motor Speech: Appears within functional limits for tasks assessed   GO                    Russell [name], Katherene Ponto 07/08/2021, 10:10 AM

## 2021-07-08 NOTE — Evaluation (Signed)
Occupational Therapy Evaluation Patient Details Name: Russell [name] MRN: [id number] DOB: 08/02/1941 Today's Date: 07/08/2021    History of Present Illness Pt is an 80 y/o male admitted 07/07/21. presented with fall at home and multiple skin tears, bleeding anemia, and intracranial bleed. 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   Clinical Impression   Prior to hospitalization, Pt getting around with hover round outside the home, occasionally using walking stick. Reports frequent falls. Mod I for ADL - completing bathing at the sink. Family does IADL. Today Pt is reluctant to participate in therapy complaining of pain in R shoulder, L eye swollen and declined participation in visual assessment. Min guard to come to EOB, min A +2 safety for sit<>stand with steps up the bed. Pt declined ambulation to chair or to sink for ADL. Min guard for seated ADL, min A overall for ADL at this time. OT will continue to follow acutely and recommending [place] [facility] to maximize safety and independence in ADL and functional transfers. Next session focus on visual assessment and balance while performing LB ADL.     Follow Up Recommendations  Home health OT;Supervision/Assistance - 24 hour (initially)    Equipment Recommendations  None recommended by OT (Pt has appropriate DME)    Recommendations for Other Services PT consult     Precautions / Restrictions Precautions Precautions: Fall Precaution Comments: reports frequently falling in home Restrictions Weight Bearing  Restrictions: No      Mobility Bed Mobility Overal bed mobility: Needs Assistance Bed Mobility: Sidelying to Sit;Supine to Sit   Sidelying to sit: Min guard Supine to sit: Min guard     General bed mobility comments: no physical assist needed    Transfers Overall transfer level: Needs assistance Equipment used: 1 person hand held assist Transfers: Sit to/from Stand Sit to Stand: Min assist;+2 safety/equipment         General transfer comment: assist for balance, taking side steps up the bed    Balance Overall balance assessment: Needs assistance Sitting-balance support: No upper extremity supported Sitting balance-Leahy Scale: Fair Sitting balance - Comments: EOB for grooming tasks   Standing balance support: Single extremity supported Standing balance-Leahy Scale: Poor Standing balance comment: dependent on external assist for balance                           ADL either performed or assessed with clinical judgement   ADL Overall ADL's : Needs assistance/impaired Eating/Feeding: Modified independent;Bed level   Grooming: Min guard;Wash/dry face;Sitting Grooming Details (indicate cue type and reason): EOB Upper Body Bathing: Minimal assistance   Lower Body Bathing: Min guard;Sitting/lateral leans   Upper Body Dressing : Min guard   Lower Body Dressing: Min guard;Sitting/lateral leans Lower Body Dressing Details (indicate cue type and reason): to don socks, increased assist needed for sit<>stand items Toilet Transfer: Minimal assistance;Ambulation Toilet Transfer Details (indicate cue type and reason): 1 person HHA Toileting- Clothing Manipulation and Hygiene: Moderate assistance;Sit to/from Nurse, children's Details (indicate cue type and reason): sponge bathes at sink Functional mobility during ADLs: Minimal assistance;Cueing for sequencing (1  person HHA) General ADL Comments: decreased safety awareness, initially resistant to  participation     Vision Baseline Vision/History: Glaucoma Patient Visual Report: No change from baseline Vision Assessment?: Vision impaired- to be further tested in functional context Additional Comments: L eye swollen, Pt declined participation in visual assessment today     Perception     Praxis      Pertinent Vitals/Pain Pain Assessment: 0-10 Pain Score: 10-Worst pain ever Pain Location: R shoulder Pain Descriptors / Indicators: Aching;Grimacing;Sore Pain Intervention(s): Limited activity within patient's tolerance;Monitored during session;Repositioned     Hand Dominance Right   Extremity/Trunk Assessment Upper Extremity Assessment Upper Extremity Assessment: Generalized weakness (eval limited by pain in R shoulder (declined MMT))   Lower Extremity Assessment Lower Extremity Assessment: Defer to PT evaluation   Cervical / Trunk Assessment Cervical / Trunk Assessment: Kyphotic   Communication Communication Communication: No difficulties   Cognition Arousal/Alertness: Awake/alert Behavior During Therapy: WFL for tasks assessed/performed (resistant to participate - but did with heavy encouragement) Overall Cognitive Status: No family/caregiver present to determine baseline cognitive functioning Area of Impairment: Safety/judgement;Awareness;Problem solving                         Safety/Judgement: Decreased awareness of safety;Decreased awareness of deficits Awareness: Emergent Problem Solving: Decreased initiation;Requires verbal cues General Comments: Suspect close to baseline, tells of frequent falls at home   General Comments       Exercises     Shoulder Instructions      Home Living Family/patient expects to be discharged to:: Private residence Living Arrangements: Spouse/significant other Available Help at Discharge: Family;Available 24 hours/day (daughter also assists) Type of Home: House Home Access: Ramped entrance     Home Layout:  One level     Bathroom Shower/Tub: Walk-in shower (but bathes at sink)   Bathroom Toilet: Handicapped height     Home Equipment: Environmental consultant - 2 wheels;Cane - single point;Wheelchair - power (hover round)      Lives With: Spouse    Prior Functioning/Environment Level of Independence: Needs assistance  Gait / Transfers Assistance Needed: frequently falls when getting around home, uses hover round WC outside of home ADL's / Homemaking Assistance Needed: bathes at sink, does use higher toilet   Comments: has walking stick that he occasionally uses        OT Problem List: Decreased strength;Decreased range of motion;Decreased activity tolerance;Impaired balance (sitting and/or standing);Impaired vision/perception;Decreased coordination;Decreased cognition;Decreased safety awareness;Pain      OT Treatment/Interventions: Self-care/ADL training;DME and/or AE instruction;Therapeutic activities;Cognitive remediation/compensation;Patient/family education;Balance training    OT Goals(Current goals can be found in the care plan section) Acute Rehab OT Goals Patient Stated Goal: to get back to sleep OT Goal Formulation: With patient Time For Goal Achievement: 07/22/21 Potential to Achieve Goals: Good ADL Goals Pt Will Perform Grooming: with modified independence;standing Pt Will Perform Upper Body Dressing: with modified independence;sitting Pt Will Perform Lower Body Dressing: with supervision;sit to/from stand Pt Will Transfer to Toilet: with supervision;ambulating Pt Will Perform Toileting - Clothing Manipulation and hygiene: with supervision;sit to/from stand  OT Frequency: Min 2X/week   Barriers to D/C:    no family present during evaluation to confirm dc set up       Co-evaluation PT/OT/SLP Co-Evaluation/Treatment: Yes Reason for Co-Treatment: Necessary to address cognition/behavior during functional activity;For patient/therapist safety;To address functional/ADL transfers;Other  (comment) (hx of refusing therapy in previous hospitalizations) PT goals addressed during session: Mobility/safety with mobility;Balance OT goals addressed during session: ADL's  and self-care      AM-PAC OT "6 Clicks" Daily Activity     Outcome Measure Help from another person eating meals?: None Help from another person taking care of personal grooming?: A Little Help from another person toileting, which includes using toliet, bedpan, or urinal?: A Lot Help from another person bathing (including washing, rinsing, drying)?: A Little Help from another person to put on and taking off regular upper body clothing?: A Little Help from another person to put on and taking off regular lower body clothing?: A Lot 6 Click Score: 17   End of Session Equipment Utilized During Treatment: Gait belt Nurse Communication: Mobility status;Precautions;Other (comment) (condom cath off)  Activity Tolerance: Patient limited by pain (potentially self-limiting, required HEAVY encouragement from therapy team) Patient left: in bed;with call bell/phone within reach;with bed alarm set;with SCD's reapplied  OT Visit Diagnosis: Unsteadiness on feet (R26.81);Other abnormalities of gait and mobility (R26.89);Repeated falls (R29.6);Muscle weakness (generalized) (M62.81);History of falling (Z91.81);Low vision, both eyes (H54.2);Other symptoms and signs involving the nervous system (R29.898);Other symptoms and signs involving cognitive function;Pain Pain - Right/Left: Right Pain - part of body: Shoulder                Time: 9611-6435 OT Time Calculation (min): 10 min Charges:  OT General Charges $OT Visit: 1 Visit OT Evaluation $OT Eval Moderate Complexity: [place] OTR/L Acute Rehabilitation Services Pager: [phone] Office: [place] 07/08/2021, 10:25 AM

## 2021-07-08 NOTE — Progress Notes (Signed)
  Echocardiogram 2D Echocardiogram has been performed.  Russell [name] 07/08/2021, 1:25 PM

## 2021-07-08 NOTE — Evaluation (Signed)
Physical Therapy Evaluation Patient Details Name: Russell [name] MRN: [id number] DOB: 17-Jul-1941 Today's Date: 07/08/2021   History of Present Illness  Pt is an 80 y/o male admitted 07/07/21. presented with fall at home and multiple skin tears, bleeding anemia, and intracranial bleed. 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   Clinical Impression  Patient received in bed, with much encouragement he is agreeable to stand at edge of bed and get re-positioned in bed. Patient just wants to sleep. He requires min guard for bed mobility and min assist +2 for sit to stand and to take a few steps up along edge of bed. Patient will continue to benefit from skilled PT while here to improve functional independence and safety.      Follow Up Recommendations Home health PT;Supervision for mobility/OOB    Equipment Recommendations  None recommended by PT    Recommendations for Other Services       Precautions / Restrictions Precautions Precautions: Fall Precaution Comments: reports frequently falling in home Restrictions Weight Bearing Restrictions: No      Mobility  Bed Mobility Overal bed mobility: Needs Assistance Bed Mobility: Supine to Sit;Sit to Supine   Sidelying to sit: Min guard Supine to sit: Min guard Sit to supine: Supervision   General bed mobility comments: no physical assist needed    Transfers Overall transfer level: Needs assistance Equipment used: 1 person hand held assist Transfers: Sit to/from Stand Sit to Stand: Min assist;+2  physical assistance         General transfer comment: assist for balance, taking side steps up the bed  Ambulation/Gait Ambulation/Gait assistance: Min assist;+2 physical assistance Gait Distance (Feet): 3 Feet Assistive device: 2 person hand held assist Gait Pattern/deviations: Step-to pattern;Decreased step length - right;Decreased step length - left Gait velocity: decreased   General Gait Details: patient able to ambulate a few steps up along edge of bed, he was unwilling to do more than that.  Stairs            Wheelchair Mobility    Modified Rankin (Stroke Patients Only)       Balance Overall balance assessment: Needs assistance Sitting-balance support: Feet supported Sitting balance-Leahy Scale: Fair Sitting balance - Comments: EOB for grooming tasks   Standing balance support: During functional activity;Single extremity supported Standing balance-Leahy Scale: Poor Standing balance comment: dependent on external assist for balance                             Pertinent Vitals/Pain Pain Assessment: 0-10 Pain Score: 10-Worst pain ever Pain Location: R shoulder Pain Descriptors / Indicators: Aching;Grimacing;Sore Pain Intervention(s): Monitored during session;Repositioned    Home Living Family/patient expects to be discharged to:: Private residence Living Arrangements: Spouse/significant other Available Help at Discharge: Family;Available 24 hours/day Type of Home: House Home Access: Ramped entrance     Home Layout: One level Home Equipment: Walker - 2 wheels;Cane - single point;Wheelchair - power      Prior Function Level of Independence: Needs assistance   Gait / Transfers Assistance Needed: frequently falls when getting around home, uses hover round WC  outside of home  ADL's / Homemaking Assistance Needed: bathes at sink, does use higher toilet  Comments: has walking stick that he occasionally uses     Hand Dominance   Dominant  Hand: Right    Extremity/Trunk Assessment   Upper Extremity Assessment Upper Extremity Assessment: Defer to OT evaluation    Lower Extremity Assessment Lower Extremity Assessment: Generalized weakness    Cervical / Trunk Assessment Cervical / Trunk Assessment: Kyphotic  Communication   Communication: No difficulties  Cognition Arousal/Alertness: Awake/alert Behavior During Therapy: WFL for tasks assessed/performed Overall Cognitive Status: No family/caregiver present to determine baseline cognitive functioning Area of Impairment: Safety/judgement;Awareness;Problem solving                         Safety/Judgement: Decreased awareness of safety;Decreased awareness of deficits Awareness: Emergent Problem Solving: Decreased initiation;Requires verbal cues General Comments: Suspect close to baseline, tells of frequent falls at home      General Comments      Exercises     Assessment/Plan    PT Assessment Patient needs continued PT services  PT Problem List Decreased strength;Decreased mobility;Decreased activity tolerance;Decreased balance;Decreased cognition;Decreased knowledge of precautions;Decreased safety awareness       PT Treatment Interventions DME instruction;Therapeutic exercise;Gait training;Balance training;Functional mobility training;Therapeutic activities;Patient/family education    PT Goals (Current goals can be found in the Care Plan section)  Acute Rehab PT Goals Patient Stated Goal: to get back to sleep PT Goal Formulation: With patient Time For Goal Achievement: 07/15/21    Frequency Min 3X/week   Barriers to discharge        Co-evaluation PT/OT/SLP Co-Evaluation/Treatment: Yes Reason for Co-Treatment: For patient/therapist safety;Necessary to address cognition/behavior during functional activity;To address functional/ADL transfers PT goals addressed during session: Mobility/safety with mobility;Balance OT goals addressed during  session: ADL's and self-care       AM-PAC PT "6 Clicks" Mobility  Outcome Measure Help needed turning from your back to your side while in a flat bed without using bedrails?: A Little Help needed moving from lying on your back to sitting on the side of a flat bed without using bedrails?: A Little Help needed moving to and from a bed to a chair (including a wheelchair)?: A Little Help needed standing up from a chair using your arms (e.g., wheelchair or bedside chair)?: A Little Help needed to walk in hospital room?: A Lot Help needed climbing 3-5 steps with a railing? : A Lot 6 Click Score: 16    End of Session Equipment Utilized During Treatment: Gait belt Activity Tolerance: Patient tolerated treatment well Patient left: in bed;with call bell/phone within reach;with bed alarm set;with SCD's reapplied Nurse Communication: Mobility status PT Visit Diagnosis: Unsteadiness on feet (R26.81);Other abnormalities of gait and mobility (R26.89);Repeated falls (R29.6);Muscle weakness (generalized) (M62.81);Difficulty in walking, not elsewhere classified (R26.2)    Time: 0623-7628 PT Time Calculation (min) (ACUTE ONLY): 10 min   Charges:   PT Evaluation $PT Eval Moderate Complexity: 1 Mod          Rim Thatch, PT, GCS 07/08/21,10:57 AM

## 2021-07-09 DIAGNOSIS — D62 Acute posthemorrhagic anemia: Secondary | ICD-10-CM

## 2021-07-09 DIAGNOSIS — S0101XA Laceration without foreign body of scalp, initial encounter: Secondary | ICD-10-CM

## 2021-07-09 DIAGNOSIS — N179 Acute kidney failure, unspecified: Secondary | ICD-10-CM

## 2021-07-09 DIAGNOSIS — R41841 Cognitive communication deficit: Secondary | ICD-10-CM

## 2021-07-09 LAB — CBC
HCT: 29.9 % — ABNORMAL LOW (ref 39.0–52.0)
Hemoglobin: 9.4 g/dL — ABNORMAL LOW (ref 13.0–17.0)
MCH: 24.9 pg — ABNORMAL LOW (ref 26.0–34.0)
MCHC: 31.4 g/dL (ref 30.0–36.0)
MCV: 79.3 fL — ABNORMAL LOW (ref 80.0–100.0)
Platelets: 411 10*3/uL — ABNORMAL HIGH (ref 150–400)
RBC: 3.77 MIL/uL — ABNORMAL LOW (ref 4.22–5.81)
RDW: 20.2 % — ABNORMAL HIGH (ref 11.5–15.5)
WBC: 11.5 10*3/uL — ABNORMAL HIGH (ref 4.0–10.5)
nRBC: 0 % (ref 0.0–0.2)

## 2021-07-09 LAB — BASIC METABOLIC PANEL
Anion gap: 9 (ref 5–15)
BUN: 16 mg/dL (ref 8–23)
CO2: 21 mmol/L — ABNORMAL LOW (ref 22–32)
Calcium: 9.3 mg/dL (ref 8.9–10.3)
Chloride: 109 mmol/L (ref 98–111)
Creatinine, Ser: 1.44 mg/dL — ABNORMAL HIGH (ref 0.61–1.24)
GFR, Estimated: 49 mL/min — ABNORMAL LOW (ref >60)
Glucose, Bld: 117 mg/dL — ABNORMAL HIGH (ref 70–99)
Potassium: 3.8 mmol/L (ref 3.5–5.1)
Sodium: 139 mmol/L (ref 135–145)

## 2021-07-09 LAB — T3: T3, Total: 76 ng/dL (ref 71–180)

## 2021-07-09 NOTE — Assessment & Plan Note (Signed)
----   Closed by EDP on recommendation of neurosurgery.  Suture removal in 5 days, 7/23.

## 2021-07-09 NOTE — Assessment & Plan Note (Signed)
--  s/p scalp laceration --no clear s/s of GI blood loss. F/u as an an outpatient

## 2021-07-09 NOTE — Assessment & Plan Note (Addendum)
on CKD stage IIIb baseline creatinne 1.5-1.6 --losartan on hold --creatinine appears to be back to baseline

## 2021-07-09 NOTE — Progress Notes (Signed)
Subjective: Patient reports headache and difficulty seeing out of his left eye which is swollen shut.  Objective: Vital signs in last 24 hours: Temp:  [98.2 F (36.8 C)-100.6 F (38.1 C)] 98.3 F (36.8 C) (07/20 1521) Pulse Rate:  [64-70] 64 (07/20 1521) Resp:  [17-18] 18 (07/20 1521) BP: (134-164)/(72-91) 160/89 (07/20 1521) SpO2:  [93 %-100 %] 100 % (07/20 1521)  Intake/Output from previous day: 07/19 0701 - 07/20 0700 In: 600 [P.O.:600] Out: 1375 [Urine:1375] Intake/Output this shift: Total I/O In: 120 [P.O.:120] Out: -   Russell [name] is awake and alert and sitting in the chair.  The left eyes swollen shut.  Russell [name] has good extraocular movements on the right.  Russell [name] is moving all extremities.  Russell [name] is following commands.  Russell [name] is answering questions.  Lab Results: Lab Results  Component Value Date   WBC 11.5 (H) 07/09/2021   HGB 9.4 (L) 07/09/2021   HCT 29.9 (L) 07/09/2021   MCV 79.3 (L) 07/09/2021   PLT 411 (H) 07/09/2021   Lab Results  Component Value Date   INR 1.00 01/21/2017   BMET Lab Results  Component Value Date   NA 139 07/09/2021   K 3.8 07/09/2021   CL 109 07/09/2021   CO2 21 (L) 07/09/2021   GLUCOSE 117 (H) 07/09/2021   BUN 16 07/09/2021   CREATININE 1.44 (H) 07/09/2021   CALCIUM 9.3 07/09/2021    Studies/Results: CT HEAD WO CONTRAST  Result Date: 07/08/2021 CLINICAL DATA:  Intracranial hemorrhage follow-up EXAM: CT HEAD WITHOUT CONTRAST TECHNIQUE: Contiguous axial images were obtained from the base of the skull through the vertex without intravenous contrast. COMPARISON:  07/07/2021 FINDINGS: Brain: Multifocal acute extra-axial hemorrhage is unchanged. No midline shift or other mass effect. Mild generalized volume loss. There is periventricular hypoattenuation compatible with chronic microvascular disease. Vascular: No abnormal hyperdensity of the major intracranial arteries or dural venous sinuses. No intracranial atherosclerosis. Skull: Multiple facial and anterior  skull base fractures are unchanged. Sinuses/Orbits: No fluid levels or advanced mucosal thickening of the visualized paranasal sinuses. No mastoid or middle ear effusion. The orbits are normal. IMPRESSION: 1. Unchanged multifocal acute extra-axial hemorrhage. 2. Unchanged multiple facial and anterior skull base fractures. Electronically Signed   By: Ulyses Jarred M.D.   On: 07/08/2021 03:26   ECHOCARDIOGRAM COMPLETE  Result Date: 07/08/2021    ECHOCARDIOGRAM REPORT   Patient Name:   Russell [name] Date of Exam: 07/08/2021 Medical Rec #:  [id number]         Height:       74.0 in Accession #:    [id number]        Weight:       167.5 lb Date of Birth:  1941/02/06        BSA:          2.016 m Patient Age:    3 years          BP:           128/75 mmHg Patient Gender: M                 HR:           57 bpm. Exam Location:  Inpatient Procedure: 2D Echo, 3D Echo, Cardiac Doppler and Color Doppler Indications:    I50.40* Unspecified combined systolic (congestive) and diastolic                 (congestive) heart failure  History:        Patient  has prior history of Echocardiogram examinations, most                 recent 06/24/2017. Stroke and COPD, Signs/Symptoms:Fever,                 Dizziness/Lightheadedness, Shortness of Breath and Chest Pain;                 Risk Factors:Current Smoker, Hypertension and Diabetes. Cancer.                 ICH.  Sonographer:    Roseanna Rainbow RDCS Referring Phys: [id number] [place] Comments: Technically difficult study due to poor echo windows. Image acquisition challenging due to uncooperative patient. Patient highly annoyed by my presence thrughout exam. Very difficult positioning. IMPRESSIONS  1. Left ventricular ejection fraction, by estimation, is 50 to 55%. Left ventricular ejection fraction by 3D volume is 51 %. The left ventricle has low normal function. The left ventricle demonstrates regional wall motion abnormalities (see scoring diagram/findings for description).  There is moderate concentric left ventricular hypertrophy. Left ventricular diastolic parameters are consistent with Grade I diastolic dysfunction (impaired relaxation). There is moderate hypokinesis of the left ventricular, basal inferior wall.  2. Right ventricular systolic function is normal. The right ventricular size is normal. Tricuspid regurgitation signal is inadequate for assessing PA pressure.  3. Left atrial size was mildly dilated.  4. 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  5. The aortic valve is tricuspid. Aortic valve regurgitation is not visualized.  6. The inferior vena cava is normal in size with greater than 50% respiratory variability, suggesting right atrial pressure of 3 mmHg. FINDINGS  Left Ventricle: Left ventricular ejection fraction, by estimation, is 50 to 55%. Left ventricular ejection fraction by 3D volume is 51 %. The left ventricle has low normal function. The left ventricle demonstrates regional wall motion abnormalities. Moderate hypokinesis of the left ventricular, basal inferior wall. The left ventricular internal cavity size was normal in size. There is moderate concentric left ventricular hypertrophy. Left ventricular diastolic parameters are consistent with Grade I diastolic dysfunction (impaired relaxation). Normal left ventricular filling pressure. Right Ventricle: The right ventricular size is normal. No increase in right ventricular wall thickness. Right ventricular systolic function is normal. Tricuspid regurgitation signal is inadequate for assessing PA pressure. Left Atrium: Left atrial size was mildly dilated. Right Atrium: Right atrial size was normal in size. Pericardium: There is no evidence of pericardial effusion. 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 Mild to moderate mitral valve regurgitation. Tricuspid Valve: The tricuspid valve is normal in structure. Tricuspid valve regurgitation is trivial. Aortic Valve: The aortic valve is tricuspid. Aortic valve regurgitation is not visualized. Pulmonic Valve: The pulmonic valve was normal in structure. Pulmonic valve regurgitation is trivial. Aorta: The aortic root is normal in size and structure. Venous: The inferior vena cava is normal in size with greater than 50% respiratory variability, suggesting right atrial pressure of 3 mmHg. IAS/Shunts: No atrial level shunt detected by color flow Doppler.  LEFT VENTRICLE PLAX 2D LVIDd:         5.20 cm         Diastology LVIDs:         4.00 cm  LV e' medial:    6.20 cm/s LV PW:         1.50 cm         LV E/e' medial:  8.8 LV IVS:        1.80 cm         LV e' lateral:   9.68 cm/s LVOT diam:     2.20 cm         LV E/e' lateral: 5.6 LV SV:         94 LV SV Index:   47 LVOT Area:     3.80 cm        3D Volume EF                                LV 3D EF:    Left                                             ventricular LV Volumes (MOD)                            ejection LV vol d, MOD    143.0 ml                   fraction by A2C:                                        3D volume LV vol d, MOD    111.0 ml                   is 51 %. A4C: LV vol s, MOD    75.2 ml A2C:                           3D Volume EF: LV vol s, MOD    65.2 ml       3D EF:        51 % A4C: LV SV MOD A2C:   67.8 ml LV SV MOD A4C:   111.0 ml LV SV MOD BP:    60.5 ml RIGHT VENTRICLE             IVC RV S prime:     18.70 cm/s  IVC diam: 0.90 cm TAPSE (M-mode): 1.8 cm LEFT ATRIUM             Index       RIGHT ATRIUM           Index LA diam:        4.00 cm 1.98 cm/m  RA Area:     16.20 cm LA Vol (A2C):   47.7 ml 23.66 ml/m RA Volume:   37.30 ml  18.50 ml/m LA Vol (A4C):   30.0 ml 14.88 ml/m LA Biplane Vol: 40.9 ml 20.29 ml/m  AORTIC VALVE LVOT Vmax:   133.00 cm/s LVOT  Vmean:  82.300 cm/s LVOT VTI:    0.248 m  AORTA Ao Root diam: 3.60 cm Ao Asc diam:  3.30 cm MITRAL VALVE MV Area (PHT): 2.11 cm    SHUNTS MV Decel Time: 359 msec    Systemic VTI:  0.25 m MR PISA:        0.25 cm   Systemic Diam: 2.20 cm MR PISA Radius: 0.20 cm MV E velocity: 54.60 cm/s MV A velocity: 94.80 cm/s MV E/A ratio:  0.58 Mihai Croitoru MD Electronically signed by Sanda Klein MD Signature Date/Time: 07/08/2021/4:32:33 PM    Final     Assessment/Plan: Overall Russell [name] is stable.  We will sign off but please call for any further questions.  We will have him follow-up with [place] 2 weeks after discharge.  Estimated body mass index is 21.51 kg/m as calculated from the following:   Height as of 03/18/21: 6\' 2"  (1.88 m).   Weight as of 03/18/21: 76 kg.    LOS: 2 days    [name] 07/09/2021, 4:13 PM

## 2021-07-09 NOTE — Hospital Course (Addendum)
80 year old man with several day history of urinary frequency, malaise and generalized weakness, got up to go to the bathroom at home when his legs gave out, hit head on stairs.  Admitted for intracranial hemorrhage, traumatic, multiple facial fractures, acute blood loss anemia from scalp laceration.

## 2021-07-09 NOTE — Progress Notes (Signed)
PT Cancellation Note  Patient Details Name: DIAGO HAIK MRN: [id number] DOB: 1941/10/21   Cancelled Treatment:    Reason Eval/Treat Not Completed: Patient declined, no reason specified. Patient restless today, bed alarm going off multiple times, but he declines getting up walking or sitting in recliner. Will continue to re-attempt as able.    Chung Chagoya 07/09/2021, 2:01 PM

## 2021-07-09 NOTE — Progress Notes (Signed)
PROGRESS NOTE  Russell [name] GYK:[id number] DOB: March 28, 1941 DOA: 07/07/2021 PCP: Marcie Mowers, FNP  Brief History   80 year old man with several day history of urinary frequency, malaise and generalized weakness, got up to go to the bathroom at home when his legs gave out, hit head on stairs.  Admitted for intracranial hemorrhage, traumatic, multiple facial fractures, acute blood loss anemia from scalp laceration.  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 --Management per neurosurgery, no surgical intervention recommended.  Neurosurgery recommended primary closure.  Repeat CT head without change.  Continue to hold Plavix. Neurochecks. --HH OT, PT  Fall at home, initial encounter Mechanical fall at home, by history the patient's legs "gave out".  Not clear whether he lost consciousness. --orthostatic here -- may be etiology of fall at home. Echo normal LVEF --may need to stop Flomax  AKI (acute kidney injury) ([place]) on CKD stage IIIb baseline creatinne 1.5-1.6 --losartan on hold --creatinine appears to be back to baseline  Scalp laceration ---- Closed by EDP on recommendation of neurosurgery.  Suture removal in 5 days, 7/23.  Facial fracture (HCC) Multiple fractures including left skull, zygomatic arch, left orbit, left frontal bone, left maxilla, nasal bone.  All nondisplaced.  --No surgical intervention per ENT.  Avoid nose blowing for 3 weeks.  Follow-up as needed as an outpatient.  Cognitive communication deficit --HH SLP  ABLA (acute blood loss anemia) --s/p scalp laceration --no clear s/s of GI blood loss. 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   Difficult to place patient No  DVT prophylaxis: SCD's Start: 07/07/21 1613   Code Status: Full Code Level of care: Telemetry Medical Family Communication: none  Murray Hodgkins, MD  [facility] Direct contact: see [email or web address] (further directions at bottom of note if needed) 7PM-7AM contact night coverage as at bottom of note 07/09/2021, 6:54 PM  LOS: 2 days   Significant Hospital Events      Consults:  Trauma Neurosurgery  ENT   Procedures:  Scalp laceration repair 7/18   Significant Diagnostic Tests:     Micro Data:     Antimicrobials:    Interval History/Subjective  CC: f/u fall  C/o pain left arm Otherwise ok No HA Vision left eye fine No n/v  Objective   Vitals:  Vitals:   07/09/21 1101 07/09/21 1521  BP: (!) 154/84 (!) 160/89  Pulse: 67 64  Resp: 18 18  Temp: 98.2 F (36.8 C) 98.3 F (36.8 C)  SpO2: 99% 100%    Exam: Physical Exam Vitals and nursing note reviewed.  Constitutional:      General: He is not in acute distress. HENT:     Head:     Comments: Left temple laceration well-approximated Eyes:     Comments: Left eye swollen shut. Left eye appears unremarkable  Cardiovascular:     Rate and Rhythm: Normal rate  and regular rhythm.     Heart sounds: No murmur heard.   No gallop.  Pulmonary:     Effort: Pulmonary effort is normal. No respiratory distress.     Breath sounds: Normal breath sounds. No wheezing.  Musculoskeletal:     Right lower leg: No edema.     Left lower leg: No edema.     Comments: Swelling left AC from IV No pain in hand or in arm on palpation Moves all extremities to command  Neurological:     General: No focal deficit present.     Mental Status: He is  alert.     Comments: Oriented self, hospital, not month or year  Psychiatric:        Mood and Affect: Mood normal.        Behavior: Behavior normal.    I have personally reviewed the labs and other data, making special note of:   Today's Data  Creatinine back to baseline Hgb up to 9.4   Scheduled Meds:   stroke: mapping our early stages of recovery book   Does not apply Once   amitriptyline  25 mg Oral QHS   brimonidine  1 drop Both Eyes TID   brinzolamide  1 drop Both Eyes TID   ferrous sulfate  325 mg Oral Daily   gabapentin  300 mg Oral Daily   latanoprost  1 drop Both Eyes QHS   lidocaine-EPINEPHrine  10 mL Infiltration Once   metoprolol tartrate  12.5 mg Oral BID   mirabegron ER  25 mg Oral Daily   oxybutynin  5 mg Oral q morning   pravastatin  80 mg Oral Daily   tamsulosin  0.4 mg Oral QHS   Continuous Infusions:    Principal Problem:   Intracranial bleed (HCC) Active Problems:   AKI (acute kidney injury) ([place])   Fall at home, initial encounter   Facial fracture ([place])   Scalp laceration   Anemia   Acute lower UTI   Peripheral neuropathy   ABLA (acute blood loss anemia)   Cognitive communication deficit   LOS: 2 days   How to contact the [facility] Attending or Consulting provider [place] or covering provider during after hours [place], for this patient?  Check the care team in [facility] and look for a) attending/consulting [facility] provider listed and b) the [facility] team listed Log into [email or web address] and use [place]'s universal password to access. If you do not have the password, please contact the hospital operator. Locate the [facility] provider you are looking for under [facility] and page to a number that you can be directly reached. If you still have difficulty reaching the provider, please page the [facility] (Director on Call) for the Hospitalists listed on amion for assistance.

## 2021-07-09 NOTE — Assessment & Plan Note (Signed)
Multiple fractures including left skull, zygomatic arch, left orbit, left frontal bone, left maxilla, nasal bone.  All nondisplaced.  --No surgical intervention per ENT.  Avoid nose blowing for 3 weeks.  Follow-up as needed as an outpatient.

## 2021-07-09 NOTE — TOC Initial Note (Signed)
Transition of Care [facility]) - Initial/Assessment Note    Patient Details  Name: Russell [name] MRN: [id number] Date of Birth: 26-Jul-1941  Transition of Care [facility]) CM/SW Contact:    Pollie Friar, RN Phone Number: 07/09/2021, 3:01 PM  Clinical Narrative:                 Patient lives at home with his spouse. Pt states he has walker and cane at home. Pt states his wife provides the needed transportation. She also does his medications.  Recommendations for [facility] services. Pt without a preference for HH. Services arranged through [facility].  TOC following for further d/c needs.   Expected Discharge Plan: [place] Barriers to Discharge: Continued Medical Work up   Patient Goals and CMS Choice   CMS Medicare.gov Compare Post Acute Care list provided to:: Patient Choice offered to / list presented to : Patient  Expected Discharge Plan and Services Expected Discharge Plan: [place]   Discharge Planning Services: CM Consult Post Acute Care Choice: [place] arrangements for the past 2 months: Single Family Home                           HH Arranged: PT, OT HH Agency: [place] [facility] Agency Contacted: 07/09/21   Representative spoke with at [place]: Tommi Rumps  Prior Living Arrangements/Services Living arrangements for the past 2 months: [place] Lives with:: Spouse Patient language and need for interpreter reviewed:: Yes Do you feel safe going back to the place where you live?: Yes      Need for Family Participation in Patient Care: Yes (Comment)     Criminal Activity/Legal Involvement Pertinent to Current Situation/Hospitalization: No - Comment as needed  Activities of Daily Living      Permission Sought/Granted                  Emotional Assessment Appearance:: Appears stated age Attitude/Demeanor/Rapport: Engaged Affect (typically observed): Accepting Orientation: : Oriented to Self,  Oriented to Place, Oriented to Situation   Psych Involvement: No (comment)  Admission diagnosis:  Lower urinary tract infectious disease [N39.0] Fall [W19.XXXA] Right elbow pain [M25.521] Intracranial bleed ([place]) [I62.9] Fall, initial encounter [W19.XXXA] Traumatic hemorrhage of cerebrum without loss of consciousness, unspecified laterality, initial encounter ([place]) [S06.360A] Open extensive facial fractures, initial encounter ([place]) [S02.92XB] Anemia, unspecified type [D64.9] Patient Active Problem List   Diagnosis Date Noted   Intracranial bleed ([place]) 07/07/2021   Fall    Traumatic hemorrhage of cerebrum without loss of consciousness ([place])    Open extensive facial fractures ([place])    Osteoarthritis 05/31/2018   Foot pain, left 05/11/2018   Chest pain 06/23/2017   AKI (acute kidney injury) ([place]) 06/23/2017   Acute respiratory insufficiency    Surgery, elective    Radiculopathy 01/27/2017   Cervical radiculopathy 01/27/2017   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   Peripheral arterial disease ([place]) 01/23/2016   SLAC (scapholunate advanced collapse) of wrist 11/13/2015   Fever 02/22/2015   Epididymitis, left 02/22/2015   Cellulitis 02/22/2015   Orchitis 02/22/2015   Testicular abscess 02/22/2015   DM type 2 (diabetes mellitus, type 2) ([place]) 02/22/2015   Anemia 02/22/2015   Epididymitis 02/22/2015   Lower urinary tract infectious disease 02/22/2015   Hypotension 03/22/2014   Neuroma digital nerve 09/28/2013   Neuroma, 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  SEIZURE, 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   Dehydration 07/03/2010   NAUSEA AND VOMITING 07/03/2010   BENIGN PROSTATIC HYPERTROPHY, WITH URINARY OBSTRUCTION 06/13/2010   ARM PAIN 06/03/2010   DYSPNEA ON EXERTION 05/15/2010   TOBACCO ABUSE 04/03/2010   COPD 04/03/2010   G E R D 04/03/2010   OTHER DYSPHAGIA 04/03/2010   PNEUMONIA, RIGHT LOWER LOBE 02/19/2010   DIABETES MELLITUS, TYPE  II, CONTROLLED 01/14/2010   LUMBAR RADICULOPATHY, LEFT 05/16/2009   DYSPHAGIA UNSPECIFIED 05/16/2009   COUGH DUE TO ACE INHIBITORS 04/02/2009   DIZZINESS 07/27/2008   HYPERCHOLESTEROLEMIA, MIXED 05/11/2008   CATARACTS, BILATERAL 03/01/2008   MILD SPINAL STENOSIS, CERVICAL 01/12/2008   LEG CRAMPS 01/12/2008   EARLY SATIETY 11/04/2007   DIVERTICULOSIS, COLON 08/31/2007   SCABIES 08/26/2007   ERECTILE DYSFUNCTION 08/18/2007   ABUSE, ALCOHOL SUSPECTED 08/18/2007   Essential hypertension 08/17/2007   HEMOCCULT POSITIVE STOOL 07/05/2007   ANEMIA, IRON DEFICIENCY NOS 05/31/2007   WEIGHT LOSS, ABNORMAL 05/10/2006   PCP:  Marcie Mowers, FNP Pharmacy:   [facility] [address] [place] [address] [place] [address] Phone: [phone] Fax: [place], [place] [address] [place] [address] Phone: [phone] Fax: [phone]  [facility] [address] [address] Phone: [phone] Fax: [phone]     Social Determinants of Health (SDOH) Interventions    Readmission Risk Interventions No flowsheet data found.

## 2021-07-09 NOTE — Assessment & Plan Note (Signed)
Mechanical fall at home, by history the patient's legs "gave out".  Not clear whether he lost consciousness. --orthostatic here -- may be etiology of fall at home. Echo normal LVEF --may need to stop Flomax

## 2021-07-09 NOTE — Assessment & Plan Note (Signed)
--  [facility] SLP

## 2021-07-09 NOTE — Assessment & Plan Note (Signed)
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 --Management per neurosurgery, no surgical intervention recommended.  Neurosurgery recommended primary closure.  Repeat CT head without change.  Continue to hold Plavix. Neurochecks. --HH OT, PT

## 2021-07-09 NOTE — Assessment & Plan Note (Signed)
--  completed 3 days antibiotic

## 2021-07-09 NOTE — Assessment & Plan Note (Signed)
--  secondary to chronic alcohol use (quit 2003) --continue gabapentin

## 2021-07-10 ENCOUNTER — Ambulatory Visit: Payer: Medicare Other | Admitting: Podiatry

## 2021-07-10 DIAGNOSIS — S0292XA Unspecified fracture of facial bones, initial encounter for closed fracture: Secondary | ICD-10-CM

## 2021-07-10 LAB — URINE CULTURE: Culture: >=100000 — AB

## 2021-07-10 LAB — GLUCOSE, CAPILLARY: Glucose-Capillary: 135 mg/dL — ABNORMAL HIGH (ref 70–99)

## 2021-07-10 MED ORDER — ACETAMINOPHEN 325 MG PO TABS: 650.0000 mg | ORAL_TABLET | ORAL | Status: DC | PRN

## 2021-07-10 NOTE — Discharge Summary (Addendum)
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 Continue to hold ASA and Plavix until follow-up with neurosurgery in 2 weeks.  --HH OT, PT, SLP  Fall at home, initial encounter Mechanical fall at home, by history the patient's legs "gave out".  Not clear whether he lost consciousness. --orthostatic here -- may be etiology of fall at home. Echo normal LVEF. Losartan stopped on discharge. --may need to stop Flomax, defer to PCP for now --Slow position changes, drink plenty of fluids  Scalp laceration Closed by EDP on recommendation of neurosurgery.  Suture removal in 5 days, 7/23-7/25, can follow-up with PCP or be removed by [facility] Facial fracture ([place]) Multiple fractures including left skull, zygomatic arch, left orbit, left frontal bone, left maxilla, nasal bone.  All nondisplaced.  --No surgical intervention per ENT.  Avoid nose blowing for 3 weeks.  Follow-up as needed as an outpatient.  Cognitive communication deficit HH SLP    Follow-up Information     [name], MD. Schedule an appointment as soon as possible for a visit in 2 week(s).   Specialty: Neurosurgery Contact information: [address] [place] [address] [phone]         Care, [facility] Follow up.   Specialty: Home Health Services Why: The home health agency will contact you for the first home visit. Contact information: [address] [place] [address] [phone]         Marcie Mowers, FNP. Schedule an appointment as soon as possible for a visit in 2 day(s).   Specialty: Family Medicine Why: for suture  removal Contact information: [address] [place] [address] [phone]                  Discharge Diagnoses: Principal diagnosis is #1 Principal Problem:   Intracranial bleed ([place]) Active Problems:   AKI (acute kidney injury) ([name])   Fall at home, initial encounter   Facial fracture ([name])   Scalp laceration   Anemia   Acute lower UTI   Peripheral neuropathy   ABLA (acute blood loss anemia)   Cognitive communication deficit   Discharge Condition: improved Disposition: home with HHPTOTSLP  Diet recommendation:  Diet Orders (From admission, onward)     Start     Ordered   07/10/21 0000  Diet - low sodium heart healthy        07/10/21 1540   07/07/21 1614  Diet Heart Room service appropriate? Yes; Fluid consistency: Thin  Diet effective now       Question Answer Comment  Room service appropriate? Yes   Fluid consistency: Thin      07/07/21 1614             There were no vitals filed for this visit.  HPI/Hospital Course:   80 year old man with several day history of urinary frequency, malaise and generalized weakness, got up to go to the bathroom at home when his legs gave out, hit head on stairs.  Admitted for intracranial hemorrhage, traumatic, multiple facial fractures, acute blood loss anemia from scalp laceration.  Seen by trauma surgery, ENT, neurosurgery, supportive care and conservative management recommended.  Patient without apparent significant sequela at this point.  Follow-up CT head without significant change.  Neurologically intact.  Seen by neurosurgery and cleared for discharge, will follow-up with neurosurgery as an outpatient.  Mild orthostasis noted, recommend slow position changes.  Plenty of fluids.  I spoke with wife, reviewed med changes and need to get stitches out as noted.  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 --Management per neurosurgery, no surgical intervention recommended.  Neurosurgery recommended primary closure.  Repeat CT head without change.  Continue to hold Plavix until follow-up with neurosurgery in 2 weeks.  --HH OT, PT, SLP  Fall at home, initial encounter Mechanical fall at home, by history the patient's legs "gave out".  Not clear whether he lost consciousness. --orthostatic here -- may be etiology of fall at home. Echo normal LVEF --may need to stop Flomax, defer to PCP for now --Slow position changes, drink plenty of fluids  AKI (acute kidney injury) ([name]) on CKD stage IIIb baseline creatinne 1.5-1.6 --losartan can restart on discharge --creatinine back to baseline  Scalp laceration ---- Closed by EDP on recommendation of neurosurgery.  Suture removal in 5 days, 7/23, can follow-up with PCP  Facial fracture [facility]) Multiple fractures including left skull, zygomatic arch, left orbit, left frontal bone, left maxilla, nasal bone.  All nondisplaced.  --No surgical intervention per ENT.  Avoid nose blowing for 3 weeks.  Follow-up as needed as an outpatient.  Cognitive communication deficit --HH SLP  ABLA (acute blood loss anemia) --s/p scalp laceration -- Hemoglobin stable status post transfusion.  No s/s of GI blood loss. 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  Patient without symptoms other than some frequency, has history of BPH.  I do not think culture directed antibiotic therapy is indicated at this time.   Consults:  Trauma Neurosurgery ENT   Procedures:  Scalp laceration repair 7/18   Today's assessment: S: CC: f/u fall  Feels fine except some bilateral arm pain.  O: Vitals:  Vitals:   07/10/21 1508 07/10/21 1510  BP: 102/66 108/81  Pulse: 84 93  Resp:    Temp:     SpO2: 99% 100%    Constitutional:  Appears calm and comfortable ENMT:  grossly normal hearing  Respiratory:  CTA bilaterally, no w/r/r.  Respiratory effort normal.  Cardiovascular:  RRR, no m/r/g No LE extremity edema   Musculoskeletal:  RUE, LUE, RLE, LLE   strength and tone grossly normal  Neurologic:  CN grossly intact, left eye EOMI, pupil appears normal Psychiatric:  Mental status Mood, affect appropriate Oriented self, location, month, eayr   Discharge Instructions  Discharge Instructions     Diet - low sodium heart healthy   Complete by: As directed    Discharge instructions   Complete by: As directed    Call your physician or seek immediate medical attention for falls, pain, confusion, swelling, weakness, difficulty speaking or swallowing or worsening of condition.   Discharge wound care:   Complete by: As directed    May shower, keep laceration repair clean and dry. Be sure to make an appointment to have the stitches taken out 7/23-7/25   Increase activity slowly   Complete by: As directed       Allergies as of 07/10/2021       Reactions   Tramadol  Confusion (intolerance)        Medication List     STOP taking these medications    aspirin EC 81 MG tablet   cefadroxil 500 MG capsule Commonly known as: DURICEF   cefUROXime 500 MG tablet Commonly known as: CEFTIN   clopidogrel 75 MG tablet Commonly known as: PLAVIX   HYDROcodone-acetaminophen 5-325 MG tablet Commonly known as: NORCO/VICODIN   losartan 100 MG tablet Commonly known as: COZAAR   methylPREDNISolone 4 MG Tbpk tablet Commonly known as: MEDROL DOSEPAK   oxyCODONE-acetaminophen 10-325 MG tablet Commonly known as: PERCOCET       TAKE these medications    acetaminophen 325 MG tablet Commonly known as: TYLENOL Take 2 tablets (650 mg total) by mouth every 4 (four) hours as needed for mild pain (or temp > 37.5 C (99.5 F)).   amitriptyline 25 MG tablet Commonly known  as: ELAVIL Take 10 mg by mouth at bedtime.   brimonidine 0.2 % ophthalmic solution Commonly known as: ALPHAGAN Place 1 drop into both eyes 2 (two) times daily.   DULoxetine 60 MG capsule Commonly known as: CYMBALTA Take 60 mg by mouth daily.   ferrous sulfate 325 (65 FE) MG EC tablet Take 325 mg by mouth 3 (three) times daily with meals. What changed: Another medication with the same name was removed. Continue taking this medication, and follow the directions you see here.   Fish Oil 1000 MG Cpdr Take 1,000 mg by mouth daily.   gabapentin 300 MG capsule Commonly known as: NEURONTIN Take 300 mg by mouth daily.   hydrochlorothiazide 12.5 MG tablet Commonly known as: HYDRODIURIL Take 12.5 mg by mouth daily.   meclizine 25 MG tablet Commonly known as: ANTIVERT Take 25 mg by mouth daily as needed for dizziness.   Myrbetriq 25 MG Tb24 tablet Generic drug: mirabegron ER Take 25 mg by mouth daily.   oxybutynin 5 MG tablet Commonly known as: DITROPAN Take 10 mg by mouth every morning.   pravastatin 80 MG tablet Commonly known as: PRAVACHOL Take 80 mg by mouth daily.   Simbrinza 1-0.2 % Susp Generic drug: Brinzolamide-Brimonidine Place 1 drop into both eyes 3 (three) times daily.   tamsulosin 0.4 MG Caps capsule Commonly known as: FLOMAX Take 0.4 mg by mouth daily.       ASK your doctor about these medications    Azopt 1 % ophthalmic suspension Generic drug: brinzolamide Place 1 drop into both eyes 3 (three) times daily.   bimatoprost 0.01 % Soln Commonly known as: LUMIGAN Place 1 drop into both eyes at bedtime.   esomeprazole 40 MG capsule Commonly known as: NEXIUM Take 1 capsule (40 mg total) by mouth daily.               Discharge Care Instructions  (From admission, onward)           Start     Ordered   07/10/21 0000  Discharge wound care:       Comments: May shower, keep laceration repair clean and dry. Be sure to make an appointment to  have the stitches taken out 7/23-7/25   07/10/21 1540           Allergies  Allergen Reactions   Tramadol     Confusion (intolerance)    The results of significant diagnostics from this hospitalization (including imaging, microbiology, ancillary and laboratory) are listed below for reference.    Significant Diagnostic Studies: DG ELBOW COMPLETE RIGHT (3+VIEW)  Result Date: 07/07/2021 CLINICAL  DATA:  Fall and altered mental status EXAM: RIGHT ELBOW - COMPLETE 3+ VIEW COMPARISON:  None. FINDINGS: There is no evidence of acute fracture. The elbow is held in flexion resulting in a suboptimal lateral view, however there is no evidence of displaced posterior fat pad to suggest a joint effusion. There is mild ulnar trochlear joint degenerative change. IMPRESSION: No evidence of acute fracture. Electronically Signed   By: Maurine Simmering   On: 07/07/2021 13:58   CT HEAD WO CONTRAST  Result Date: 07/08/2021 CLINICAL DATA:  Intracranial hemorrhage follow-up EXAM: CT HEAD WITHOUT CONTRAST TECHNIQUE: Contiguous axial images were obtained from the base of the skull through the vertex without intravenous contrast. COMPARISON:  07/07/2021 FINDINGS: Brain: Multifocal acute extra-axial hemorrhage is unchanged. No midline shift or other mass effect. Mild generalized volume loss. There is periventricular hypoattenuation compatible with chronic microvascular disease. Vascular: No abnormal hyperdensity of the major intracranial arteries or dural venous sinuses. No intracranial atherosclerosis. Skull: Multiple facial and anterior skull base fractures are unchanged. Sinuses/Orbits: No fluid levels or advanced mucosal thickening of the visualized paranasal sinuses. No mastoid or middle ear effusion. The orbits are normal. IMPRESSION: 1. Unchanged multifocal acute extra-axial hemorrhage. 2. Unchanged multiple facial and anterior skull base fractures. Electronically Signed   By: Ulyses Jarred M.D.   On: 07/08/2021 03:26    CT Head Wo Contrast  Result Date: 07/07/2021 CLINICAL DATA:  [place] today striking head, level 2 fall, on blood thinners, LEFT frontal laceration, uncertain loss of consciousness EXAM: CT HEAD WITHOUT CONTRAST CT MAXILLOFACIAL WITHOUT CONTRAST CT CERVICAL SPINE WITHOUT CONTRAST TECHNIQUE: Multidetector CT imaging of the head, cervical spine, and maxillofacial structures were performed using the standard protocol without intravenous contrast. Multiplanar CT image reconstructions of the cervical spine and maxillofacial structures were also generated. COMPARISON:  CT head 11/23/2010 FINDINGS: CT HEAD FINDINGS Brain: Generalized atrophy. Normal ventricular morphology. No midline shift or mass effect. Dural calcifications in falx. Scattered high attenuation acute hemorrhage identified extra-axial at anterior aspect of interhemispheric fissure, in BILATERAL frontal regions, and at anterior aspect of LEFT middle cranial fossa consistent with acute intracranial hemorrhage. Small vessel chronic ischemic changes of deep cerebral white matter. No intraparenchymal hemorrhage or infarction. No mass. Vascular: No hyperdense vessels. Atherosclerotic calcifications of internal carotid arteries at skull base Skull: Nondisplaced LEFT frontal bone fractures. Other: N/A CT MAXILLOFACIAL FINDINGS Osseous: TMJ alignment normal bilaterally. Nondisplaced fractures involving LEFT zygomatic arch, lateral and superolateral walls of the LEFT orbit, and LEFT frontal bone. LEFT frontal bone fracture extends obliquely to involve the LEFT frontal sinus. Fracture at medial wall LEFT orbit extending into base of LEFT nasal bone. Questionable tiny fracture at anterior LEFT maxilla. Orbits: Intraorbital soft tissue planes clear. LEFT periorbital contusion and soft tissue swelling. Sinuses: Fluid air-fluid levels within the LEFT frontal, sphenoid, and BILATERAL maxillary sinuses. Middle ear cavities and mastoid air cells clear Soft tissues: Foci of  soft tissue gas LEFT periorbital and at LEFT infratemporal fossa. CT CERVICAL SPINE FINDINGS Alignment: Normal Skull base and vertebrae: Prior anterior fusion C3-C4. Interval removal of prior anterior C4-C6 plate with intact bony fusion. Interval posterior fusion C3-C7. Disc space narrowing with endplate spur formation at C6-C7. Vertebral body heights maintained without acute fracture or subluxation. Scattered facet degenerative changes. Skull base intact. Soft tissues and spinal canal: Prevertebral soft tissues normal thickness. Atherosclerotic calcifications at carotid bifurcations bilaterally and at proximal great vessels. Disc levels:  No definite abnormalities Upper chest: Emphysematous changes at lung apices Other: N/A IMPRESSION:  Atrophy with small vessel chronic ischemic changes of deep cerebral white matter. Scattered acute extra-axial hemorrhage identified at anterior, interhemispheric, BILATERAL frontal, and anterior LEFT middle cranial fossa. No additional acute intracranial abnormalities. Nondisplaced LEFT frontal bone fractures. Nondisplaced fractures involving LEFT zygomatic arch, lateral and superolateral walls of the LEFT orbit, and LEFT frontal bone. Fracture at medial wall LEFT orbit extending into base of LEFT nasal bone. Questionable tiny transverse fracture at anterior LEFT maxilla. Degenerative disc and facet disease changes of the cervical spine. Prior anterior and posterior fusion changes of the cervical spine as above. No acute cervical spine abnormalities. Critical Value/emergent results were called by telephone at the time of interpretation on 07/07/2021 at 1145 hrs to provider [facility] VENTER PA, who verbally acknowledged these results. Electronically Signed   By: Lavonia Dana M.D.   On: 07/07/2021 11:47   CT Cervical Spine Wo Contrast  Result Date: 07/07/2021 CLINICAL DATA:  [place] today striking head, level 2 fall, on blood thinners, LEFT frontal laceration, uncertain loss of  consciousness EXAM: CT HEAD WITHOUT CONTRAST CT MAXILLOFACIAL WITHOUT CONTRAST CT CERVICAL SPINE WITHOUT CONTRAST TECHNIQUE: Multidetector CT imaging of the head, cervical spine, and maxillofacial structures were performed using the standard protocol without intravenous contrast. Multiplanar CT image reconstructions of the cervical spine and maxillofacial structures were also generated. COMPARISON:  CT head 11/23/2010 FINDINGS: CT HEAD FINDINGS Brain: Generalized atrophy. Normal ventricular morphology. No midline shift or mass effect. Dural calcifications in falx. Scattered high attenuation acute hemorrhage identified extra-axial at anterior aspect of interhemispheric fissure, in BILATERAL frontal regions, and at anterior aspect of LEFT middle cranial fossa consistent with acute intracranial hemorrhage. Small vessel chronic ischemic changes of deep cerebral white matter. No intraparenchymal hemorrhage or infarction. No mass. Vascular: No hyperdense vessels. Atherosclerotic calcifications of internal carotid arteries at skull base Skull: Nondisplaced LEFT frontal bone fractures. Other: N/A CT MAXILLOFACIAL FINDINGS Osseous: TMJ alignment normal bilaterally. Nondisplaced fractures involving LEFT zygomatic arch, lateral and superolateral walls of the LEFT orbit, and LEFT frontal bone. LEFT frontal bone fracture extends obliquely to involve the LEFT frontal sinus. Fracture at medial wall LEFT orbit extending into base of LEFT nasal bone. Questionable tiny fracture at anterior LEFT maxilla. Orbits: Intraorbital soft tissue planes clear. LEFT periorbital contusion and soft tissue swelling. Sinuses: Fluid air-fluid levels within the LEFT frontal, sphenoid, and BILATERAL maxillary sinuses. Middle ear cavities and mastoid air cells clear Soft tissues: Foci of soft tissue gas LEFT periorbital and at LEFT infratemporal fossa. CT CERVICAL SPINE FINDINGS Alignment: Normal Skull base and vertebrae: Prior anterior fusion C3-C4.  Interval removal of prior anterior C4-C6 plate with intact bony fusion. Interval posterior fusion C3-C7. Disc space narrowing with endplate spur formation at C6-C7. Vertebral body heights maintained without acute fracture or subluxation. Scattered facet degenerative changes. Skull base intact. Soft tissues and spinal canal: Prevertebral soft tissues normal thickness. Atherosclerotic calcifications at carotid bifurcations bilaterally and at proximal great vessels. 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 Scattered acute extra-axial hemorrhage identified at anterior, interhemispheric, BILATERAL frontal, and anterior LEFT middle cranial fossa. No additional acute intracranial abnormalities. Nondisplaced LEFT frontal bone fractures. Nondisplaced fractures involving LEFT zygomatic arch, lateral and superolateral walls of the LEFT orbit, and LEFT frontal bone. Fracture at medial wall LEFT orbit extending into base of LEFT nasal bone. Questionable tiny transverse fracture at anterior LEFT maxilla. Degenerative disc and facet disease  changes of the cervical spine. Prior anterior and posterior fusion changes of the cervical spine as above. No acute cervical spine abnormalities. Critical Value/emergent results were called by telephone at the time of interpretation on 07/07/2021 at 1145 hrs to provider [facility] VENTER PA, who verbally acknowledged these results. Electronically Signed   By: Lavonia Dana M.D.   On: 07/07/2021 11:47   ECHOCARDIOGRAM COMPLETE  Result Date: 07/08/2021    ECHOCARDIOGRAM REPORT   Patient Name:   Russell [name] Date of Exam: 07/08/2021 Medical Rec #:  [id number]         Height:       74.0 in Accession #:    [id number]        Weight:       167.5 lb Date of Birth:  03-01-1941        BSA:          2.016 m Patient Age:    34 years          BP:            128/75 mmHg Patient Gender: M                 HR:           57 bpm. Exam Location:  Inpatient Procedure: 2D Echo, 3D Echo, Cardiac Doppler and Color Doppler Indications:    I50.40* Unspecified combined systolic (congestive) and diastolic                 (congestive) heart failure  History:        Patient has prior history of Echocardiogram examinations, most                 recent 06/24/2017. Stroke and COPD, Signs/Symptoms:Fever,                 Dizziness/Lightheadedness, Shortness of Breath and Chest Pain;                 Risk Factors:Current Smoker, Hypertension and Diabetes. Cancer.                 ICH.  Sonographer:    Roseanna Rainbow RDCS Referring Phys: [id number] [place] Comments: Technically difficult study due to poor echo windows. Image acquisition challenging due to uncooperative patient. Patient highly annoyed by my presence thrughout exam. Very difficult positioning. IMPRESSIONS  1. Left ventricular ejection fraction, by estimation, is 50 to 55%. Left ventricular ejection fraction by 3D volume is 51 %. The left ventricle has low normal function. The left ventricle demonstrates regional wall motion abnormalities (see scoring diagram/findings for description). There is moderate concentric left ventricular hypertrophy. Left ventricular diastolic parameters are consistent with Grade I diastolic dysfunction (impaired relaxation). There is moderate hypokinesis of the left ventricular, basal inferior wall.  2. Right ventricular systolic function is normal. The right ventricular size is normal. Tricuspid regurgitation signal is inadequate for assessing PA pressure.  3. Left atrial size was mildly dilated.  4. 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  5. The aortic valve is tricuspid. Aortic valve regurgitation is not visualized.  6. The inferior vena cava is  normal in size with greater than 50% respiratory variability, suggesting right atrial pressure of 3 mmHg. FINDINGS  Left Ventricle: Left ventricular ejection fraction, by estimation, is 50 to 55%. Left ventricular ejection fraction by  3D volume is 51 %. The left ventricle has low normal function. The left ventricle demonstrates regional wall motion abnormalities. Moderate hypokinesis of the left ventricular, basal inferior wall. The left ventricular internal cavity size was normal in size. There is moderate concentric left ventricular hypertrophy. Left ventricular diastolic parameters are consistent with Grade I diastolic dysfunction (impaired relaxation). Normal left ventricular filling pressure. Right Ventricle: The right ventricular size is normal. No increase in right ventricular wall thickness. Right ventricular systolic function is normal. Tricuspid regurgitation signal is inadequate for assessing PA pressure. Left Atrium: Left atrial size was mildly dilated. Right Atrium: Right atrial size was normal in size. Pericardium: There is no evidence of pericardial effusion. 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 Mild to moderate mitral valve regurgitation. Tricuspid Valve: The tricuspid valve is normal in structure. Tricuspid valve regurgitation is trivial. Aortic Valve: The aortic valve is tricuspid. Aortic valve regurgitation is not visualized. Pulmonic Valve: The pulmonic valve was normal in structure. Pulmonic valve regurgitation is trivial. Aorta: The aortic root is normal in size and structure. Venous: The inferior vena cava is normal in size with greater than 50% respiratory variability, suggesting right atrial pressure of 3 mmHg. IAS/Shunts: No atrial level shunt detected by color flow Doppler.  LEFT VENTRICLE PLAX 2D LVIDd:         5.20 cm         Diastology LVIDs:         4.00 cm         LV e'  medial:    6.20 cm/s LV PW:         1.50 cm         LV E/e' medial:  8.8 LV IVS:        1.80 cm         LV e' lateral:   9.68 cm/s LVOT diam:     2.20 cm         LV E/e' lateral: 5.6 LV SV:         94 LV SV Index:   47 LVOT Area:     3.80 cm        3D Volume EF                                LV 3D EF:    Left                                             ventricular LV Volumes (MOD)                            ejection LV vol d, MOD    143.0 ml                   fraction by A2C:                                        3D volume LV vol d, MOD    111.0 ml  is 51 %. A4C: LV vol s, MOD    75.2 ml A2C:                           3D Volume EF: LV vol s, MOD    65.2 ml       3D EF:        51 % A4C: LV SV MOD A2C:   67.8 ml LV SV MOD A4C:   111.0 ml LV SV MOD BP:    60.5 ml RIGHT VENTRICLE             IVC RV S prime:     18.70 cm/s  IVC diam: 0.90 cm TAPSE (M-mode): 1.8 cm LEFT ATRIUM             Index       RIGHT ATRIUM           Index LA diam:        4.00 cm 1.98 cm/m  RA Area:     16.20 cm LA Vol (A2C):   47.7 ml 23.66 ml/m RA Volume:   37.30 ml  18.50 ml/m LA Vol (A4C):   30.0 ml 14.88 ml/m LA Biplane Vol: 40.9 ml 20.29 ml/m  AORTIC VALVE LVOT Vmax:   133.00 cm/s LVOT Vmean:  82.300 cm/s LVOT VTI:    0.248 m  AORTA Ao Root diam: 3.60 cm Ao Asc diam:  3.30 cm MITRAL VALVE MV Area (PHT): 2.11 cm    SHUNTS MV Decel Time: 359 msec    Systemic VTI:  0.25 m MR PISA:        0.25 cm   Systemic Diam: 2.20 cm MR PISA Radius: 0.20 cm MV E velocity: 54.60 cm/s MV A velocity: 94.80 cm/s MV E/A ratio:  0.58 Mihai Croitoru MD Electronically signed by Sanda Klein MD Signature Date/Time: 07/08/2021/4:32:33 PM    Final    DG HIP UNILAT WITH PELVIS 2-3 VIEWS RIGHT  Result Date: 07/07/2021 CLINICAL DATA:  Right hip pain after fall today. EXAM: DG HIP (WITH OR WITHOUT PELVIS) 2-3V RIGHT COMPARISON:  None. FINDINGS: There is no evidence of hip fracture or dislocation. There is no evidence of arthropathy or other  focal bone abnormality. IMPRESSION: Negative. Electronically Signed   By: Marijo Conception M.D.   On: 07/07/2021 11:43   CT Maxillofacial WO CM  Result Date: 07/07/2021 CLINICAL DATA:  [place] today striking head, level 2 fall, on blood thinners, LEFT frontal laceration, uncertain loss of consciousness EXAM: CT HEAD WITHOUT CONTRAST CT MAXILLOFACIAL WITHOUT CONTRAST CT CERVICAL SPINE WITHOUT CONTRAST TECHNIQUE: Multidetector CT imaging of the head, cervical spine, and maxillofacial structures were performed using the standard protocol without intravenous contrast. Multiplanar CT image reconstructions of the cervical spine and maxillofacial structures were also generated. COMPARISON:  CT head 11/23/2010 FINDINGS: CT HEAD FINDINGS Brain: Generalized atrophy. Normal ventricular morphology. No midline shift or mass effect. Dural calcifications in falx. Scattered high attenuation acute hemorrhage identified extra-axial at anterior aspect of interhemispheric fissure, in BILATERAL frontal regions, and at anterior aspect of LEFT middle cranial fossa consistent with acute intracranial hemorrhage. Small vessel chronic ischemic changes of deep cerebral white matter. No intraparenchymal hemorrhage or infarction. No mass. Vascular: No hyperdense vessels. Atherosclerotic calcifications of internal carotid arteries at skull base Skull: Nondisplaced LEFT frontal bone fractures. Other: N/A CT MAXILLOFACIAL FINDINGS Osseous: TMJ alignment normal bilaterally. Nondisplaced fractures involving LEFT zygomatic arch, lateral and superolateral walls of the LEFT orbit,  and LEFT frontal bone. LEFT frontal bone fracture extends obliquely to involve the LEFT frontal sinus. Fracture at medial wall LEFT orbit extending into base of LEFT nasal bone. Questionable tiny fracture at anterior LEFT maxilla. Orbits: Intraorbital soft tissue planes clear. LEFT periorbital contusion and soft tissue swelling. Sinuses: Fluid air-fluid levels within the LEFT  frontal, sphenoid, and BILATERAL maxillary sinuses. Middle ear cavities and mastoid air cells clear Soft tissues: Foci of soft tissue gas LEFT periorbital and at LEFT infratemporal fossa. CT CERVICAL SPINE FINDINGS Alignment: Normal Skull base and vertebrae: Prior anterior fusion C3-C4. Interval removal of prior anterior C4-C6 plate with intact bony fusion. Interval posterior fusion C3-C7. Disc space narrowing with endplate spur formation at C6-C7. Vertebral body heights maintained without acute fracture or subluxation. Scattered facet degenerative changes. Skull base intact. Soft tissues and spinal canal: Prevertebral soft tissues normal thickness. Atherosclerotic calcifications at carotid bifurcations bilaterally and at proximal great vessels. 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 Scattered acute extra-axial hemorrhage identified at anterior, interhemispheric, BILATERAL frontal, and anterior LEFT middle cranial fossa. No additional acute intracranial abnormalities. Nondisplaced LEFT frontal bone fractures. Nondisplaced fractures involving LEFT zygomatic arch, lateral and superolateral walls of the LEFT orbit, and LEFT frontal bone. Fracture at medial wall LEFT orbit extending into base of LEFT nasal bone. Questionable tiny transverse fracture at anterior LEFT maxilla. Degenerative disc and facet disease changes of the cervical spine. Prior anterior and posterior fusion changes of the cervical spine as above. No acute cervical spine abnormalities. Critical Value/emergent results were called by telephone at the time of interpretation on 07/07/2021 at 1145 hrs to provider [facility], who verbally acknowledged these results. Electronically Signed   By: Lavonia Dana M.D.   On: 07/07/2021 11:47    Microbiology: Recent Results (from the past 240 hour(s))   Resp Panel by RT-PCR (Flu A&B, Covid) Nasopharyngeal Swab     Status: None   Collection Time: 07/07/21 12:10 PM   Specimen: Nasopharyngeal Swab; Nasopharyngeal(NP) swabs in vial transport medium  Result Value Ref Range Status   SARS Coronavirus 2 by RT PCR NEGATIVE NEGATIVE Final    Comment: (NOTE) SARS-CoV-2 target nucleic acids are NOT DETECTED.  The SARS-CoV-2 RNA is generally detectable in upper respiratory specimens during the acute phase of infection. The lowest concentration of SARS-CoV-2 viral copies this assay can detect is 138 copies/mL. A negative result does not preclude SARS-Cov-2 infection and should not be used as the sole basis for treatment or other patient management decisions. A negative result may occur with  improper specimen collection/handling, submission of specimen other than nasopharyngeal swab, presence of viral mutation(s) within the areas targeted by this assay, and inadequate number of viral copies(<138 copies/mL). A negative result must be combined with clinical observations, patient history, and epidemiological information. The expected result is Negative.  Fact Sheet for Patients:  EntrepreneurPulse.com.au  Fact Sheet for Healthcare Providers:  IncredibleEmployment.be  This test is no t yet approved or cleared by the [place] FDA and  has been authorized for detection and/or diagnosis of SARS-CoV-2 by FDA under an Emergency Use Authorization (EUA). This EUA will remain  in effect (meaning this test can be used) for the duration of the COVID-19 declaration under Section 564(b)(1) of the Act, 21 U.S.C.section 360bbb-3(b)(1), unless the authorization is terminated  or revoked sooner.       Influenza A  by PCR NEGATIVE NEGATIVE Final   Influenza B by PCR NEGATIVE NEGATIVE Final    Comment: (NOTE) The Xpert Xpress SARS-CoV-2/FLU/RSV plus assay is intended as an aid in the diagnosis of influenza from  Nasopharyngeal swab specimens and should not be used as a sole basis for treatment. Nasal washings and aspirates are unacceptable for Xpert Xpress SARS-CoV-2/FLU/RSV testing.  Fact Sheet for Patients: EntrepreneurPulse.com.au  Fact Sheet for Healthcare Providers: IncredibleEmployment.be  This test is not yet approved or cleared by the [place] FDA and has been authorized for detection and/or diagnosis of SARS-CoV-2 by FDA under an Emergency Use Authorization (EUA). This EUA will remain in effect (meaning this test can be used) for the duration of the COVID-19 declaration under Section 564(b)(1) of the Act, 21 U.S.C. section 360bbb-3(b)(1), unless the authorization is terminated or revoked.  Performed at [facility], [place] [address], [place], [place] [address]   Urine Culture     Status: Abnormal   Collection Time: 07/07/21  2:53 PM   Specimen: Urine, Clean Catch  Result Value Ref Range Status   Specimen Description URINE, CLEAN CATCH  Final   Special Requests   Final    NONE Performed at [facility], [place] [address], [place],  [address]    Culture >=100,000 COLONIES/mL STAPHYLOCOCCUS EPIDERMIDIS (A)  Final   Report Status 07/10/2021 FINAL  Final   Organism ID, Bacteria STAPHYLOCOCCUS EPIDERMIDIS (A)  Final      Susceptibility   Staphylococcus epidermidis - MIC*    CIPROFLOXACIN <=0.5 SENSITIVE Sensitive     GENTAMICIN <=0.5 SENSITIVE Sensitive     NITROFURANTOIN <=16 SENSITIVE Sensitive     OXACILLIN >=4 RESISTANT Resistant     TETRACYCLINE 2 SENSITIVE Sensitive     VANCOMYCIN 2 SENSITIVE Sensitive     TRIMETH/SULFA >=320 RESISTANT Resistant     CLINDAMYCIN RESISTANT Resistant     RIFAMPIN <=0.5 SENSITIVE Sensitive     Inducible Clindamycin POSITIVE Resistant     * >=100,000 COLONIES/mL STAPHYLOCOCCUS EPIDERMIDIS     Labs: Basic Metabolic Panel: Recent Labs  Lab 07/07/21 1153 07/07/21 1205 07/08/21 0406  07/09/21 0759  NA 138 139  --  139  K 4.3 4.3  --  3.8  CL 105 109  --  109  CO2 22  --   --  21*  GLUCOSE 120* 114*  --  117*  BUN 28* 30*  --  16  CREATININE 1.97* 2.10*  --  1.44*  CALCIUM 9.3  --   --  9.3  MG  --   --  2.5*  --     CBC: Recent Labs  Lab 07/07/21 1153 07/07/21 1205 07/07/21 2337 07/09/21 0759  WBC 13.9*  --   --  11.5*  NEUTROABS 12.1*  --   --   --   HGB 6.5* 7.1* 8.9* 9.4*  HCT 22.5* 21.0* 28.9* 29.9*  MCV 78.9*  --   --  79.3*  PLT PLATELET CLUMPS NOTED ON SMEAR, UNABLE TO ESTIMATE  --   --  411*   CBG: Recent Labs  Lab 07/07/21 1021 07/10/21 1124  GLUCAP 119* 135*    Principal Problem:   Intracranial bleed (HCC) Active Problems:   AKI (acute kidney injury) ([place])   Fall at home, initial encounter   Facial fracture (HCC)   Scalp laceration   Anemia   Acute lower UTI   Peripheral neuropathy   ABLA (acute blood loss anemia)   Cognitive communication deficit  Time coordinating discharge: 25 minutes  Signed:  Murray Hodgkins, MD  [facility]  07/10/2021, 3:44 PM

## 2021-07-10 NOTE — Progress Notes (Signed)
OT Cancellation Note  Patient Details Name: Russell [name] MRN: [id number] DOB: Feb 10, 1941   Cancelled Treatment:    Reason Eval/Treat Not Completed: Other (comment) (Pt sleeping. Waking to his name. Able to open both eyes; reports blurry vision from L eye. Request to continue sleeping and agreeable to therapy tomorrow.)  [place], OTR/L Acute Rehab Pager: [phone] Office: [phone] 07/10/2021, 3:48 PM

## 2021-07-10 NOTE — Plan of Care (Signed)
Min assist with adls, continues to act impulsively even with staff nearby

## 2021-07-10 NOTE — TOC Transition Note (Signed)
Transition of Care [facility]) - CM/SW Discharge Note   Patient Details  Name: Russell [name] MRN: [id number] Date of Birth: 1941-07-10  [facility] CM/SW Contact:  Pollie Friar, RN Phone Number: 07/10/2021, 4:40 PM   Clinical Narrative:    Patient is discharging home with [facility] services through [place]. Cory with [facility] aware.  No DME needs. Pt has transportation home.   Final next level of care: Home w Home Health Services Barriers to Discharge: No Barriers Identified   Patient Goals and CMS Choice   CMS Medicare.gov Compare Post Acute Care list provided to:: Patient Choice offered to / list presented to : Patient  Discharge Placement                       Discharge Plan and Services   Discharge Planning Services: CM Consult Post Acute Care Choice: Home Health                    [facility] Arranged: Speech Therapy [place] Agency: [place] Date [place]: 07/09/21   Representative spoke with at [place]: [place] ([place]) Interventions     Readmission Risk Interventions No flowsheet data found.

## 2021-07-10 NOTE — Progress Notes (Signed)
  Speech Language Pathology Treatment: Cognitive-Linquistic  Patient Details Name: Russell [name] MRN: [id number] DOB: February 01, 1941 Today's Date: 07/10/2021 Time: 5003-7048 SLP Time Calculation (min) (ACUTE ONLY): 12 min  Assessment / Plan / Recommendation Clinical Impression  Pt sitting at EOB and alert upon SLP arrival, consenting to cognitive diagnostic treatment. When presented with questions related to orientation, pt fully oriented to self, with partial knowledge of time and place. He was unable to tell me reason for hospitalization. Educated pt regarding external memory aids to increase recall of this information. When provided x3 home safety scenarios, pt demonstrated 30% accuracy in identifying effects of problems and 60% accuracy in providing appropriate solutions to problems, given mod verbal/question cues. He verbally sequenced a common ADL with 50% accuracy provided mod-max verbal cues. Pt would benefit from continued SLP services during acute stay to maximize cognitive functions for improved safety and decision making.    HPI HPI: Russell [name] is a 80 y.o. male presetning after a fall with head trauma. Pt with multiple skin tearing, bleeding anemia and intracranial bleed. Had positive for small scattered extra-axetil hemorrhages, anterior, interhemispheric, bilateral frontal and anterior left middle cranial fossa. CT head also showed multiple fractures involving her left skull, zygomatic arch, left orbital left frontal bone, left maxillary and nasal bone. 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

## 2021-07-10 NOTE — Care Management Important Message (Signed)
Important Message  Patient Details  Name: Russell [name] MRN: [id number] Date of Birth: 28-Apr-1941   Medicare Important Message Given:  Yes     Orbie Pyo 07/10/2021, 3:39 PM

## 2021-07-11 ENCOUNTER — Telehealth: Payer: Self-pay | Admitting: Podiatry

## 2021-07-11 MED ORDER — OXYCODONE-ACETAMINOPHEN 5-325 MG PO TABS: 1.0000 | ORAL_TABLET | ORAL | 0 refills | Status: DC | PRN

## 2021-07-11 NOTE — Addendum Note (Signed)
Addended by: Boneta Lucks on: 07/11/2021 01:18 PM   Modules accepted: Orders

## 2021-07-11 NOTE — Telephone Encounter (Signed)
Patient is requesting a refill on pain medication

## 2021-07-21 ENCOUNTER — Encounter (HOSPITAL_COMMUNITY): Payer: Self-pay

## 2021-07-21 ENCOUNTER — Emergency Department (HOSPITAL_COMMUNITY): Payer: Medicare Other

## 2021-07-21 ENCOUNTER — Other Ambulatory Visit: Payer: Self-pay

## 2021-07-21 ENCOUNTER — Emergency Department (HOSPITAL_COMMUNITY)
Admission: EM | Admit: 2021-07-21 | Discharge: 2021-07-21 | Disposition: A | Discharge status: Home / Self Care | Payer: Medicare Other | Attending: Emergency Medicine | Admitting: Emergency Medicine

## 2021-07-21 DIAGNOSIS — E119 Type 2 diabetes mellitus without complications: Secondary | ICD-10-CM | POA: Diagnosis not present

## 2021-07-21 DIAGNOSIS — M25552 Pain in left hip: Secondary | ICD-10-CM | POA: Insufficient documentation

## 2021-07-21 DIAGNOSIS — I10 Essential (primary) hypertension: Secondary | ICD-10-CM | POA: Diagnosis not present

## 2021-07-21 DIAGNOSIS — F17210 Nicotine dependence, cigarettes, uncomplicated: Secondary | ICD-10-CM | POA: Insufficient documentation

## 2021-07-21 DIAGNOSIS — J449 Chronic obstructive pulmonary disease, unspecified: Secondary | ICD-10-CM | POA: Diagnosis not present

## 2021-07-21 DIAGNOSIS — R55 Syncope and collapse: Secondary | ICD-10-CM | POA: Insufficient documentation

## 2021-07-21 DIAGNOSIS — Z79899 Other long term (current) drug therapy: Secondary | ICD-10-CM | POA: Insufficient documentation

## 2021-07-21 DIAGNOSIS — I2510 Atherosclerotic heart disease of native coronary artery without angina pectoris: Secondary | ICD-10-CM | POA: Insufficient documentation

## 2021-07-21 DIAGNOSIS — W01198A Fall on same level from slipping, tripping and stumbling with subsequent striking against other object, initial encounter: Secondary | ICD-10-CM | POA: Diagnosis not present

## 2021-07-21 DIAGNOSIS — W19XXXA Unspecified fall, initial encounter: Secondary | ICD-10-CM

## 2021-07-21 DIAGNOSIS — Z8546 Personal history of malignant neoplasm of prostate: Secondary | ICD-10-CM | POA: Insufficient documentation

## 2021-07-21 LAB — BASIC METABOLIC PANEL
Anion gap: 10 (ref 5–15)
BUN: 24 mg/dL — ABNORMAL HIGH (ref 8–23)
CO2: 22 mmol/L (ref 22–32)
Calcium: 8.9 mg/dL (ref 8.9–10.3)
Chloride: 106 mmol/L (ref 98–111)
Creatinine, Ser: 1.94 mg/dL — ABNORMAL HIGH (ref 0.61–1.24)
GFR, Estimated: 34 mL/min — ABNORMAL LOW (ref >60)
Glucose, Bld: 148 mg/dL — ABNORMAL HIGH (ref 70–99)
Potassium: 3.5 mmol/L (ref 3.5–5.1)
Sodium: 138 mmol/L (ref 135–145)

## 2021-07-21 LAB — CBC
HCT: 27.7 % — ABNORMAL LOW (ref 39.0–52.0)
Hemoglobin: 8.5 g/dL — ABNORMAL LOW (ref 13.0–17.0)
MCH: 25.1 pg — ABNORMAL LOW (ref 26.0–34.0)
MCHC: 30.7 g/dL (ref 30.0–36.0)
MCV: 82 fL (ref 80.0–100.0)
Platelets: 383 10*3/uL (ref 150–400)
RBC: 3.38 MIL/uL — ABNORMAL LOW (ref 4.22–5.81)
RDW: 21.4 % — ABNORMAL HIGH (ref 11.5–15.5)
WBC: 8.6 10*3/uL (ref 4.0–10.5)
nRBC: 0 % (ref 0.0–0.2)

## 2021-07-21 LAB — TROPONIN I (HIGH SENSITIVITY): Troponin I (High Sensitivity): 7 ng/L (ref <18)

## 2021-07-21 LAB — CBG MONITORING, ED: Glucose-Capillary: 147 mg/dL — ABNORMAL HIGH (ref 70–99)

## 2021-07-21 NOTE — ED Provider Notes (Signed)
[facility] EMERGENCY DEPARTMENT Provider Note   CSN: [id number] Arrival date & time: 07/21/21  1217     History Chief Complaint  Patient presents with   Loss of Consciousness   Emesis    Russell [name] is a 80 y.o. male.  HPI Patient reports he does not know why he is in the emergency department.  He denies any complaints.  He denies he has any chest pain or shortness of breath.  He denies headache.  He does not recall a fall or any additional injury.  Patient is brought by EMS patient's wife reported that she heard him fall or hit the ground.  Patient did not interact verbally with EMS but in the emergency department he is answering questions and appears situationally oriented.  EMS report is for systolic blood pressure [id number] and fluids given with blood pressure response to 96/60.  His wife is not has not for additional history.    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/15/2016   COPD (chronic obstructive pulmonary disease) ([place])    Coronary atherosclerosis    denied knowledge of this 10/27/16   Depression    Diverticulosis of colon    Dyspnea    ED (erectile dysfunction)    Full dentures    GERD (gastroesophageal reflux disease)    not current   Glaucoma    History of gastritis    History of scabies    2008   History of seizure    2013-- x2   idiopathic --  none since   History of syncope    03-22-2014  DX  VAGAL RESPONSE   Hyperlipidemia, mixed    Hypertension    Mitral regurgitation    Peripheral arterial disease ([place])    one vessel runoff bilaterally via peroneal arteries   Prostate cancer ([place])    Seizures ([place]) 03/2014   ? or syncope- patient refused to be admitted for further studies- "felt better"   Type 2 diabetes, diet controlled ([place])    patient and his wife said no-  have not been told 01/21/17   Wears glasses     Patient Active Problem List   Diagnosis Date Noted   Scalp laceration 07/09/2021   ABLA (acute blood loss anemia) 07/09/2021   Cognitive communication deficit 07/09/2021   Intracranial bleed ([place]) 07/07/2021   Fall at home, initial encounter    Traumatic hemorrhage of cerebrum without loss of consciousness ([place])    Facial fracture ([place])    Osteoarthritis 05/31/2018   Foot pain, left 05/11/2018   Chest pain 06/23/2017   AKI (acute kidney injury) ([place]) 06/23/2017   Acute respiratory insufficiency    Surgery, elective    Peripheral neuropathy 01/27/2017   Cervical radiculopathy 01/27/2017   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   Peripheral arterial disease ([place]) 01/23/2016   SLAC (scapholunate advanced collapse) of wrist 11/13/2015   Fever 02/22/2015   Epididymitis, left 02/22/2015   Cellulitis 02/22/2015   Orchitis 02/22/2015   Testicular abscess 02/22/2015   DM type 2 (diabetes mellitus, type 2) ([place]) 02/22/2015   Anemia 02/22/2015   Epididymitis 02/22/2015   Acute lower UTI 02/22/2015   Hypotension 03/22/2014   Neuroma digital nerve 09/28/2013   Neuroma, 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   SEIZURE, GRAND  MAL 01/06/2011   Dehydration 07/03/2010   NAUSEA AND VOMITING 07/03/2010   BENIGN PROSTATIC HYPERTROPHY, WITH URINARY OBSTRUCTION 06/13/2010   ARM PAIN 06/03/2010   DYSPNEA ON EXERTION 05/15/2010   TOBACCO ABUSE 04/03/2010   COPD 04/03/2010   G E R D 04/03/2010   OTHER DYSPHAGIA 04/03/2010   PNEUMONIA, RIGHT LOWER LOBE 02/19/2010   DIABETES MELLITUS, TYPE II, CONTROLLED 01/14/2010   LUMBAR RADICULOPATHY, LEFT 05/16/2009   DYSPHAGIA UNSPECIFIED 05/16/2009   COUGH DUE TO ACE INHIBITORS 04/02/2009   DIZZINESS 07/27/2008   HYPERCHOLESTEROLEMIA, MIXED 05/11/2008   CATARACTS, BILATERAL 03/01/2008   MILD SPINAL STENOSIS, CERVICAL 01/12/2008   LEG CRAMPS 01/12/2008    EARLY SATIETY 11/04/2007   DIVERTICULOSIS, COLON 08/31/2007   SCABIES 08/26/2007   ERECTILE DYSFUNCTION 08/18/2007   ABUSE, ALCOHOL SUSPECTED 08/18/2007   Essential hypertension 08/17/2007   HEMOCCULT POSITIVE STOOL 07/05/2007   ANEMIA, IRON DEFICIENCY NOS 05/31/2007   WEIGHT LOSS, ABNORMAL 05/10/2006    Past Surgical History:  Procedure Laterality Date   ABDOMINAL AORTOGRAM W/LOWER EXTREMITY Left 12/18/2020   Procedure: ABDOMINAL AORTOGRAM W/LOWER EXTREMITY;  Surgeon: Cherre Robins, MD;  Location: [place] [facility];  Service: Cardiovascular;  Laterality: Left;   ABDOMINAL AORTOGRAM W/LOWER EXTREMITY N/A 02/26/2021   Procedure: ABDOMINAL AORTOGRAM W/LOWER EXTREMITY;  Surgeon: Cherre Robins, MD;  Location: [place] [facility];  Service: Cardiovascular;  Laterality: N/A;   ANTERIOR CERVICAL DECOMP/DISCECTOMY FUSION  10-16-2009   C4 -- C6   ANTERIOR CERVICAL DECOMP/DISCECTOMY FUSION N/A 01/27/2017   Procedure: ANTERIOR CERVICAL DECOMPRESSION FUSION CERVICAL 3-4 WITH INSTRUMENTATION AND ALLOGRAFT;  Surgeon: Phylliss Bob, MD;  Location: [place];  Service: Orthopedics;  Laterality: N/A;  ANTERIOR CERVICAL DECOMPRESSION FUSION CERVICAL 3-4 WITH INSTRUMENTATION AND ALLOGRAFT   CAPSULOTOMY Right 01/26/2013   Procedure: MINOR CAPSULOTOMY;  Surgeon: Myrtha Mantis., MD;  Location: [place];  Service: Ophthalmology;  Laterality: Right;   CARPAL TUNNEL RELEASE Left 01/02/2019   Procedure: LEFT CARPAL TUNNEL RELEASE;  Surgeon: Leanora Cover, MD;  Location: [place];  Service: Orthopedics;  Laterality: Left;   CARPECTOMY Right 03/13/2014   Procedure: RIGHT  PROXIMAL ROW CARPECTOMY;  Surgeon: Tennis Must, MD;  Location: [place];  Service: Orthopedics;  Laterality: Right;   CARPECTOMY Left 10/22/2015   Procedure: LEFT WRIST PROXIMAL ROW CARPECTOMY ;  Surgeon: Leanora Cover, MD;  Location: [place];  Service: Orthopedics;  Laterality: Left;    CARPOMETACARPAL ([place]) FUSION OF THUMB Right 03/13/2014   Procedure: RIGHT FUSION OF THUMB CARPOMETACARPAL [facility]) JOINT;  Surgeon: Tennis Must, MD;  Location: [place];  Service: Orthopedics;  Laterality: Right;   CARPOMETACARPEL SUSPENSION PLASTY Left 04/04/2020   Procedure: LEFT THUMB TRAPECIECTOMY AND SUSPENSIONPLASTY;  Surgeon: Leanora Cover, MD;  Location: [place];  Service: Orthopedics;  Laterality: Left;   CATARACT EXTRACTION W/ INTRAOCULAR LENS  IMPLANT, BILATERAL  2009   COLONOSCOPY  08-31-2007   COLONOSCOPY     CRYOABLATION N/A 01/14/2015   Procedure: CRYO ABLATION PROSTATE;  Surgeon: Ailene Rud, MD;  Location: [facility];  Service: Urology;  Laterality: N/A;   ESOPHAGOGASTRODUODENOSCOPY  last one 09-11-2008   EXCISION MASS LEFT FACE , SUBORBITAL AREA, PLASTER RECONSTRUCTION  02-09-2011   EXCISIONAL DEBRIDEMENT AND REPAIR RIGHT QUADRICEP TENDON  07-05-2000   FOOT SURGERY Left    I & D EXTREMITY Right 05/24/2013   Procedure: RIGHT INDEX WOUND DEBRIDEMENT AND CLOSURE;  Surgeon: Jolyn Nap, MD;  Location: [place];  Service: Orthopedics;  Laterality: Right;   INSERTION OF SUPRAPUBIC CATHETER N/A 01/14/2015   Procedure: SUPRAPUBIC TUBE PLACEMENT;  Surgeon: Ailene Rud, MD;  Location: [facility];  Service: Urology;  Laterality: N/A;   KNEE ARTHROSCOPY Right 2008   LARYNGOSCOPY AND ESOPHAGOSCOPY  06-13-2010   MARSUPIALIZATION LEFT LARGE VALLECULAR CYST   MASS EXCISION Left 10/22/2015   Procedure: EXCISION MASS OF LEFT INDEX FINGER;  Surgeon: Leanora Cover, MD;  Location: [place];  Service: Orthopedics;  Laterality: Left;   ORCHIECTOMY Left 02/24/2015   Procedure: SCROTAL EXPLORATION WITH LEFT ORCHIECTOMY;  Surgeon: Kathie Rhodes, MD;  Location: [place];  Service: Urology;  Laterality: Left;   PERIPHERAL VASCULAR INTERVENTION Left 02/26/2021   Procedure: PERIPHERAL VASCULAR  INTERVENTION;  Surgeon: Cherre Robins, MD;  Location: [place] [facility];  Service: Cardiovascular;  Laterality: Left;  Superficial femoral   POSTERIOR CERVICAL FUSION/FORAMINOTOMY N/A 01/28/2017   Procedure: POSTERIOR SPINAL FUSION CERVICAL 3-4, CERVICAL 4-5, CERVICAL 5-6, CERVICAL 6-7 WITH INSTRUMENATION AND ALLOGRAFT;  Surgeon: Phylliss Bob, MD;  Location: [place];  Service: Orthopedics;  Laterality: N/A;  POSTERIOR SPINAL FUSION CERVICAL 3-4, CERVICAL 4-5, CERVICAL 5-6, CERVICAL 6-7 WITH INSTRUMENATION AND ALLOGRAFT   SHOULDER ARTHROSCOPY/ DEBRIDEMENT LABRAL TEAR/  BICEPS TENOTOMY Left 09-24-2011   TONSILLECTOMY  as child   TRANSTHORACIC ECHOCARDIOGRAM  02-21-2010   normal LVF/  ef 55-60%/ mild to moderate MR/  moderate LAE/  mild TR   YAG LASER APPLICATION Right [id number]   Procedure: YAG LASER APPLICATION;  Surgeon: Myrtha Mantis., MD;  Location: [facility];  Service: Ophthalmology;  Laterality: Right;   YAG LASER CAPSULOTOMY, LEFT EYE  01-08-2011       Family History  Problem Relation Age of Onset   Unexplained death Mother        died at a young age, no known cause   Heart attack Father        age unknown   Diabetes Other    Cancer Other     Social History   Tobacco Use   Smoking status: Every Day    Packs/day: 1.00    Years: 59.00    Pack years: 59.00    Types: Cigarettes   Smokeless tobacco: Never   Tobacco comments:    Started Chantix 10/26/16  Vaping Use   Vaping Use: Never used  Substance Use Topics   Alcohol use: No    Comment: none since approx 2014- heavy before   Drug use: No    Home Medications Prior to Admission medications   Medication Sig Start Date End Date Taking? Authorizing Provider  acetaminophen (TYLENOL) 325 MG tablet Take 2 tablets (650 mg total) by mouth every 4 (four) hours as needed for mild pain (or temp > 37.5 C (99.5 F)). 07/10/21   Samuella Cota, MD  amitriptyline (ELAVIL) 25 MG tablet Take 10 mg by mouth at bedtime. 07/22/20    [provider]  AZOPT 1 % ophthalmic suspension Place 1 drop into both eyes 3 (three) times daily. Patient not taking: Reported on 07/08/2021 12/25/16   [provider]  bimatoprost (LUMIGAN) 0.01 % SOLN Place 1 drop into both eyes at bedtime. Patient not taking: Reported on 07/08/2021 04/10/13   Dhungel, Flonnie Overman, MD  brimonidine (ALPHAGAN) 0.2 % ophthalmic solution Place 1 drop into both eyes 2 (two) times daily. 03/14/19   [provider]  Brinzolamide-Brimonidine (SIMBRINZA) 1-0.2 % SUSP Place 1 drop into  both eyes 3 (three) times daily.    [provider]  DULoxetine (CYMBALTA) 60 MG capsule Take 60 mg by mouth daily.    [provider]  esomeprazole (NEXIUM) 40 MG capsule Take 1 capsule (40 mg total) by mouth daily. Patient taking differently: Take 40 mg by mouth daily as needed (acid reflux/indigestion.). 07/16/17   Isaiah Serge, NP  ferrous sulfate 325 (65 FE) MG EC tablet Take 325 mg by mouth 3 (three) times daily with meals.    [provider]  gabapentin (NEURONTIN) 300 MG capsule Take 300 mg by mouth daily. 02/25/21   [provider]  hydrochlorothiazide (HYDRODIURIL) 12.5 MG tablet Take 12.5 mg by mouth daily. 05/17/21   [provider]  meclizine (ANTIVERT) 25 MG tablet Take 25 mg by mouth daily as needed for dizziness. 06/16/17   [provider]  MYRBETRIQ 25 MG TB24 tablet Take 25 mg by mouth daily. 02/12/20   [provider]  Omega-3 Fatty Acids (FISH OIL) 1000 MG CPDR Take 1,000 mg by mouth daily.    [provider]  oxybutynin (DITROPAN) 5 MG tablet Take 10 mg by mouth every morning. 11/13/19   [provider]  oxyCODONE-acetaminophen (PERCOCET) 5-325 MG tablet Take 1 tablet by mouth every 4 (four) hours as needed for severe pain. 07/11/21   Felipa Furnace, DPM  pravastatin (PRAVACHOL) 80 MG tablet Take 80 mg by mouth daily.  10/19/16   [provider]  tamsulosin (FLOMAX)  0.4 MG CAPS capsule Take 0.4 mg by mouth daily. 12/03/16   [provider]    Allergies    Tramadol  Review of Systems   Review of Systems 10 Systems reviewed negative except for HPI Physical Exam Updated Vital Signs BP 136/75   Pulse 63   Temp 97.9 F (36.6 C) (Oral)   Resp 18   Ht '6\' 2"'$  (1.88 m)   Wt 73.5 kg   SpO2 100%   BMI 20.80 kg/m   Physical Exam Constitutional:      Comments: Patient resting his into the room.  He awakens to light voice and answers questions.  No Respiratory distress.  HENT:     Head:     Comments: Patient has a healing abrasion to the left temporal area.    Nose: Nose normal.     Mouth/Throat:     Pharynx: Oropharynx is clear.  Eyes:     Extraocular Movements: Extraocular movements intact.  Neck:     Comments: Patient does not endorse any C-spine tenderness to palpation. Cardiovascular:     Rate and Rhythm: Normal rate and regular rhythm.  Pulmonary:     Effort: Pulmonary effort is normal.     Breath sounds: Normal breath sounds.  Chest:     Chest wall: No tenderness.  Abdominal:     General: There is no distension.     Palpations: Abdomen is soft.     Tenderness: There is no abdominal tenderness.  Musculoskeletal:     Comments: No extremity deformities.  Patient can move all 4 extremities.  He reports some pain in his left pelvis or hip.  However no deformity and range of motion intact.  Neurological:     Comments: Patient is situationally aware.  He is answering questions.  He does seem mildly confused consistent with suspected dementia.  No focal motor deficits    ED Results / Procedures / Treatments   Labs (all labs ordered are listed, but only abnormal results  are displayed) Labs Reviewed  BASIC METABOLIC PANEL - Abnormal; Notable for the following components:      Result Value   Glucose, Bld 148 (*)    BUN 24 (*)    Creatinine, Ser 1.94 (*)    GFR, Estimated 34 (*)    All other components within normal limits   CBC - Abnormal; Notable for the following components:   RBC 3.38 (*)    Hemoglobin 8.5 (*)    HCT 27.7 (*)    MCH 25.1 (*)    RDW 21.4 (*)    All other components within normal limits  CBG MONITORING, ED - Abnormal; Notable for the following components:   Glucose-Capillary 147 (*)    All other components within normal limits  URINALYSIS, ROUTINE W REFLEX MICROSCOPIC  CBG MONITORING, ED    EKG EKG Interpretation  Date/Time:  Monday July 21 2021 12:37:49 EDT Ventricular Rate:  54 PR Interval:  195 QRS Duration: 102 QT Interval:  522 QTC Calculation: 495 R Axis:   54 Text Interpretation: Sinus rhythm Left ventricular hypertrophy Borderline prolonged QT interval duplicate Confirmed by Charlesetta Shanks [phone]) on 07/21/2021 12:51:28 PM  Radiology No results found.  Procedures Procedures   Medications Ordered in ED Medications - No data to display  ED Course  I have reviewed the triage vital signs and the nursing notes.  Pertinent labs & imaging results that were available during my care of the patient were reviewed by me and considered in my medical decision making (see chart for details).    MDM Rules/Calculators/A&P                           Patient presents as outlined.  At this time we will proceed with diagnostic evaluation.  Unclear etiology for mechanical fall versus possible syncope.  Will obtain CT head, EKG troponin and basic lab work.  If diagnostic evaluation is unremarkable and patient is functional at baseline, consider for discharge.  Dr. Vanita Panda to review CT scan and assess for baseline function at time of completion. Final Clinical Impression(s) / ED Diagnoses Final diagnoses:  None    Rx / DC Orders ED Discharge Orders     None        Charlesetta Shanks, MD 07/21/21 1601

## 2021-07-21 NOTE — ED Provider Notes (Signed)
6:29 PM Care of the patient assumed at signout.  On my initial examination, and now, he is in no distress, requesting discharge. In the interim I have seen and evaluated his CT scan, and I discussed it with our neurosurgeon on-call, Dr. Christella Noa.  Patient designated as appropriate for discharge with outpatient follow-up as previously planned following his initial presentation 2 weeks ago following a fall.  Given wife's description some difficulty with arranging follow-up, additional information to facilitate this has been provided to them on discharge.   Carmin Muskrat, MD 07/21/21 [phone]

## 2021-07-21 NOTE — ED Notes (Signed)
Reviewed discharge instructions with patient. Follow-up care reviewed. Patient verbalized understanding. Patient A&Ox4, VSS, and ambulatory with steady gait upon discharge.  

## 2021-07-21 NOTE — ED Notes (Signed)
Called lab to have them add troponin onto previous collection. Per lab, they will add on troponin.

## 2021-07-21 NOTE — ED Triage Notes (Signed)
Pt BIB EMS wife found him sitting in his chair after hearing a thump outside and says that he was complaining of chest pain.  Wife believes the patient passed out into the chair. Pt was found with emesis on his shirt. Pt opens eyes to verbal commands however did not speak with EMS and did not state any chest pain.    BP 80/40 before bolus and 96/60 after Bpm 60 NSR with occasional PVC  SBG 173  94% on RA Resp 16

## 2021-07-21 NOTE — ED Notes (Signed)
Pt transported to CT at this time.

## 2021-07-21 NOTE — Discharge Instructions (Addendum)
As discussed, today's evaluation has been generally reassuring.  After discussion with our neurosurgery colleagues, he does not require hospitalization, but it is important that you follow-up with them in clinic.  Please use the provided patient to ensure that you are able to follow-up this week.  Return here for concerning changes in your condition.

## 2021-07-21 NOTE — ED Notes (Signed)
Pt agitated. Pt c/o being cold and complaining d/t "I'm hungry". Pt reporting he wants to leave. Explained to pt that all we are waiting on is to get the CT scan before he can eat and drink. Also explained to pt that he would be leaving against medical advice if he were to leave now. Pt agreeable to stay for CT scan at this time.

## 2021-07-29 ENCOUNTER — Other Ambulatory Visit: Payer: Self-pay

## 2021-07-29 ENCOUNTER — Encounter: Payer: Self-pay | Admitting: Podiatry

## 2021-07-29 ENCOUNTER — Ambulatory Visit (INDEPENDENT_AMBULATORY_CARE_PROVIDER_SITE_OTHER): Payer: Medicare Other | Admitting: Podiatry

## 2021-07-29 DIAGNOSIS — G5782 Other specified mononeuropathies of left lower limb: Secondary | ICD-10-CM

## 2021-07-29 DIAGNOSIS — G5762 Lesion of plantar nerve, left lower limb: Secondary | ICD-10-CM

## 2021-07-29 DIAGNOSIS — M79676 Pain in unspecified toe(s): Secondary | ICD-10-CM

## 2021-07-29 DIAGNOSIS — B351 Tinea unguium: Secondary | ICD-10-CM

## 2021-07-29 MED ORDER — OXYCODONE-ACETAMINOPHEN 10-325 MG PO TABS: 1.0000 | ORAL_TABLET | Freq: Three times a day (TID) | ORAL | 0 refills | Status: AC | PRN

## 2021-07-30 NOTE — Progress Notes (Signed)
He presents today states that his left foot is still hurting.  Objective: Vital signs are stable he is alert and oriented x3.  There is no erythema edema/drainage or odor still has pain on palpation third interspace left foot.  Assessment: Chronic neuroma left foot  Plan: Injected the third interdigital space today with dehydrated alcohol.  Dispensed 7 days worth of narcotic.  Follow-up with him in 1 month.  Hopefully by that time he will have seen his vascular doctor and will let me know as to whether or not surgery can be performed.

## 2021-08-11 ENCOUNTER — Other Ambulatory Visit: Payer: Self-pay | Admitting: Neurology

## 2021-08-13 ENCOUNTER — Telehealth: Payer: Self-pay | Admitting: Podiatry

## 2021-08-13 NOTE — Telephone Encounter (Signed)
Patient is requesting refill on pain medication. Please advise

## 2021-08-18 ENCOUNTER — Other Ambulatory Visit: Payer: Self-pay | Admitting: Podiatry

## 2021-08-18 MED ORDER — OXYCODONE-ACETAMINOPHEN 10-325 MG PO TABS
1.0000 | ORAL_TABLET | Freq: Three times a day (TID) | ORAL | 0 refills | Status: AC | PRN
Start: 2021-08-18 — End: 2021-08-25

## 2021-08-19 ENCOUNTER — Ambulatory Visit: Payer: Medicare Other | Admitting: Podiatry

## 2021-08-19 ENCOUNTER — Encounter: Payer: Self-pay | Admitting: Podiatry

## 2021-08-19 ENCOUNTER — Ambulatory Visit (INDEPENDENT_AMBULATORY_CARE_PROVIDER_SITE_OTHER): Payer: Medicare Other | Admitting: Podiatry

## 2021-08-19 ENCOUNTER — Other Ambulatory Visit: Payer: Self-pay

## 2021-08-19 DIAGNOSIS — G5762 Lesion of plantar nerve, left lower limb: Secondary | ICD-10-CM

## 2021-08-19 DIAGNOSIS — G5782 Other specified mononeuropathies of left lower limb: Secondary | ICD-10-CM

## 2021-08-19 NOTE — Telephone Encounter (Signed)
I contacted [facility] to verify the prescription had not been picked up. The pharmacy assistant said they were out of stock of that medication, but the pharmacist was able to fill it. They will contact the patient and let him know it is ready for pick up.

## 2021-08-19 NOTE — Progress Notes (Signed)
He presents today states that he still having side effects from his previous fall with pain in his thigh and buttocks as well as his back and his left shoulder stating that his head still hurts from his fall.  His fall was back in July.  Objective: Vital signs are stable he is alert and oriented x3.  Pulses are less palpable than previously noted left.  The foot is cool to the touch there is no open lesions no open wounds no bleeding no purulence no malodor no signs of infection.  Still has pain to the third interdigital space of the left foot.  Assessment: Chronic pain left foot neuroma third interdigital space.  Plan: Follow-up with in 1 month.  He is to follow-up with his vascular doctor in the next few weeks.  May need to consider further surgery to these toes.  I did send over his narcotic.

## 2021-08-19 NOTE — Telephone Encounter (Signed)
Calling to inform Dr. Milinda Pointer that pain medication is out of stock at pharmacy. Is there another way to get RX refilled? Please advise.

## 2021-08-19 NOTE — Telephone Encounter (Signed)
Patient calling to request pain medication to be rerouted to [facility] on [address] (pt called pharmacy to see if they had medication in stock,and they do). Please advise if it will be ok to send to this pharmacy for 1 time use.

## 2021-08-21 ENCOUNTER — Telehealth: Payer: Self-pay | Admitting: Podiatry

## 2021-08-21 NOTE — Telephone Encounter (Signed)
Patient stated that the [facility] [address] is currently out of the Percocet. He stated that the [facility] on the corner of [address] has it. Could you please send medication there per Mr. Kuper.  Please Advise

## 2021-08-26 ENCOUNTER — Telehealth: Payer: Self-pay | Admitting: Podiatry

## 2021-08-26 MED ORDER — OXYCODONE-ACETAMINOPHEN 10-325 MG PO TABS: 1.0000 | ORAL_TABLET | Freq: Three times a day (TID) | ORAL | 0 refills | Status: AC | PRN

## 2021-08-26 NOTE — Telephone Encounter (Signed)
Patient called stating that he is waiting on his medication, he does not know whats taking so long. Patient is agitated at this point.

## 2021-08-26 NOTE — Addendum Note (Signed)
Addended by: Clovis Riley E on: 08/26/2021 03:57 PM   Modules accepted: Orders

## 2021-08-26 NOTE — Telephone Encounter (Signed)
I called the [facility] on [address] and apparently their store has permanently closed and I have no way of knowing whether the patient picked up the prescription or not. I will forward message to Dr. Milinda Pointer to have him decide if he wants to send in another prescription to the [facility] on [address].

## 2021-08-26 NOTE — Telephone Encounter (Signed)
Documented on 08/19/21:  I contacted [facility] to verify the prescription had not been picked up. The pharmacy assistant said they were out of stock of that medication, but the pharmacist was able to fill it. They will contact the patient and let him know it is ready for pick up.

## 2021-08-26 NOTE — Telephone Encounter (Signed)
Patient called back and stated that none of the [facility] have the percocet, is there anything else that can be prescribed.  Patient states he is in pain

## 2021-08-26 NOTE — Addendum Note (Signed)
Addended by: Tyson Dense T on: 08/26/2021 04:46 PM   Modules accepted: Orders

## 2021-08-28 ENCOUNTER — Ambulatory Visit: Payer: Medicare Other | Admitting: Podiatry

## 2021-08-29 ENCOUNTER — Other Ambulatory Visit: Payer: Self-pay

## 2021-08-29 DIAGNOSIS — I739 Peripheral vascular disease, unspecified: Secondary | ICD-10-CM

## 2021-09-02 ENCOUNTER — Ambulatory Visit (HOSPITAL_COMMUNITY)
Admission: RE | Admit: 2021-09-02 | Discharge: 2021-09-02 | Disposition: A | Discharge status: Home / Self Care | Payer: Medicare Other | Source: Ambulatory Visit | Attending: Vascular Surgery | Admitting: Vascular Surgery

## 2021-09-02 ENCOUNTER — Ambulatory Visit (INDEPENDENT_AMBULATORY_CARE_PROVIDER_SITE_OTHER)
Admission: RE | Admit: 2021-09-02 | Discharge: 2021-09-02 | Disposition: A | Discharge status: Home / Self Care | Payer: Medicare Other | Source: Ambulatory Visit | Attending: Vascular Surgery | Admitting: Vascular Surgery

## 2021-09-02 ENCOUNTER — Other Ambulatory Visit: Payer: Self-pay

## 2021-09-02 ENCOUNTER — Ambulatory Visit (INDEPENDENT_AMBULATORY_CARE_PROVIDER_SITE_OTHER): Payer: Medicare Other | Admitting: Vascular Surgery

## 2021-09-02 ENCOUNTER — Encounter: Payer: Self-pay | Admitting: Vascular Surgery

## 2021-09-02 VITALS — BP 112/67 | HR 64 | Temp 98.3°F | Resp 20 | Ht 74.0 in | Wt 157.0 lb

## 2021-09-02 DIAGNOSIS — I739 Peripheral vascular disease, unspecified: Secondary | ICD-10-CM

## 2021-09-02 DIAGNOSIS — I70222 Atherosclerosis of native arteries of extremities with rest pain, left leg: Secondary | ICD-10-CM

## 2021-09-02 NOTE — Progress Notes (Signed)
Office Note     CC:  follow up Requesting Provider:  Marcie Mowers, FNP  HPI: Russell [name] is a 80 y.o. (1941/12/11) male who presents for follow up of peripheral artery disease. He just recently had Aortogram with Left lower extremity angiogram, left SFA drug eluting stent on 02/26/21 for rest pain.  States his pain has resolved since procedure. He still is having some numbness on lateral distal leg and also in his left foot. This has been present for some time now. 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 He says sometimes these help and sometimes they don't. He has been compliant with his Aspirin, statin and Plavix. He continues to smoke 1 ppd.  The pt is on a statin for cholesterol management.  The pt is on a daily aspirin.   Other AC:  Plavix The pt is on ARB for hypertension.   The pt is not diabetic.  Tobacco hx:  Current smoker, 1 ppd   09/02/21: Patient returns to discuss options for neuroma in his foot.  He continues to do well from a vascular standpoint.  He has no symptoms typical of ischemic rest pain or claudication.  He continues to smoke.  Past Medical History:  Diagnosis Date   AKI (acute kidney injury) ([place]) 06/23/2017   Arthritis    SHOUDLERS, LUMBAR   BPH with obstruction/lower urinary tract symptoms    BPH with urinary obstruction 01/15/2016   COPD (chronic obstructive pulmonary disease) ([place])    Coronary atherosclerosis    denied knowledge of this 10/27/16   Depression    Diverticulosis of colon    Dyspnea    ED (erectile dysfunction)    Full dentures    GERD (gastroesophageal reflux disease)    not current   Glaucoma    History of gastritis    History of scabies    2008   History of seizure    2013-- x2   idiopathic --  none since   History of syncope    03-22-2014  DX  VAGAL RESPONSE   Hyperlipidemia, mixed    Hypertension    Mitral regurgitation    Peripheral arterial disease ([place])     one vessel runoff bilaterally via peroneal arteries   Prostate cancer ([place])    Seizures ([place]) 03/2014   ? or syncope- patient refused to be admitted for further studies- "felt better"   Type 2 diabetes, diet controlled ([place])    patient and his wife said no- have not been told 01/21/17   Wears glasses     Past Surgical History:  Procedure Laterality Date   ABDOMINAL AORTOGRAM W/LOWER EXTREMITY Left 12/18/2020   Procedure: ABDOMINAL AORTOGRAM W/LOWER EXTREMITY;  Surgeon: Cherre Robins, MD;  Location: [facility];  Service: Cardiovascular;  Laterality: Left;   ABDOMINAL AORTOGRAM W/LOWER EXTREMITY N/A 02/26/2021   Procedure: ABDOMINAL AORTOGRAM W/LOWER EXTREMITY;  Surgeon: Cherre Robins, MD;  Location: [facility];  Service: Cardiovascular;  Laterality: N/A;   ANTERIOR CERVICAL DECOMP/DISCECTOMY FUSION  10-16-2009   C4 -- C6   ANTERIOR CERVICAL DECOMP/DISCECTOMY FUSION N/A 01/27/2017   Procedure: ANTERIOR CERVICAL DECOMPRESSION FUSION CERVICAL 3-4 WITH INSTRUMENTATION AND ALLOGRAFT;  Surgeon: Phylliss Bob, MD;  Location: [facility];  Service: Orthopedics;  Laterality: N/A;  ANTERIOR CERVICAL DECOMPRESSION FUSION CERVICAL 3-4 WITH INSTRUMENTATION AND ALLOGRAFT   CAPSULOTOMY Right 01/26/2013   Procedure: MINOR CAPSULOTOMY;  Surgeon: Myrtha Mantis., MD;  Location: [facility];  Service: Ophthalmology;  Laterality: Right;   CARPAL TUNNEL RELEASE Left 01/02/2019   Procedure: LEFT CARPAL TUNNEL RELEASE;  Surgeon: Leanora Cover, MD;  Location: [facility];  Service: Orthopedics;  Laterality: Left;   CARPECTOMY Right 03/13/2014   Procedure: RIGHT  PROXIMAL ROW CARPECTOMY;  Surgeon: Tennis Must, MD;  Location: [facility];  Service: Orthopedics;  Laterality: Right;   CARPECTOMY Left 10/22/2015   Procedure: LEFT WRIST PROXIMAL ROW CARPECTOMY ;  Surgeon: Leanora Cover, MD;  Location: [facility];  Service: Orthopedics;  Laterality: Left;   CARPOMETACARPAL  ([place]) FUSION OF THUMB Right 03/13/2014   Procedure: RIGHT FUSION OF THUMB CARPOMETACARPAL [facility]) JOINT;  Surgeon: Tennis Must, MD;  Location: [place];  Service: Orthopedics;  Laterality: Right;   CARPOMETACARPEL SUSPENSION PLASTY Left 04/04/2020   Procedure: LEFT THUMB TRAPECIECTOMY AND SUSPENSIONPLASTY;  Surgeon: Leanora Cover, MD;  Location: [place];  Service: Orthopedics;  Laterality: Left;   CATARACT EXTRACTION W/ INTRAOCULAR LENS  IMPLANT, BILATERAL  2009   COLONOSCOPY  08-31-2007   COLONOSCOPY     CRYOABLATION N/A 01/14/2015   Procedure: CRYO ABLATION PROSTATE;  Surgeon: Ailene Rud, MD;  Location[facility];  Service: Urology;  Laterality: N/A;   ESOPHAGOGASTRODUODENOSCOPY  last one 09-11-2008   EXCISION MASS LEFT FACE , SUBORBITAL AREA, PLASTER RECONSTRUCTION  02-09-2011   EXCISIONAL DEBRIDEMENT AND REPAIR RIGHT QUADRICEP TENDON  07-05-2000   FOOT SURGERY Left    I & D EXTREMITY Right 05/24/2013   Procedure: RIGHT INDEX WOUND DEBRIDEMENT AND CLOSURE;  Surgeon: Jolyn Nap, MD;  Location: [place];  Service: Orthopedics;  Laterality: Right;   INSERTION OF SUPRAPUBIC CATHETER N/A 01/14/2015   Procedure: SUPRAPUBIC TUBE PLACEMENT;  Surgeon: Ailene Rud, MD;  Location[facility];  Service: Urology;  Laterality: N/A;   KNEE ARTHROSCOPY Right 2008   LARYNGOSCOPY AND ESOPHAGOSCOPY  06-13-2010   MARSUPIALIZATION LEFT LARGE VALLECULAR CYST   MASS EXCISION Left 10/22/2015   Procedure: EXCISION MASS OF LEFT INDEX FINGER;  Surgeon: Leanora Cover, MD;  Location: [facility];  Service: Orthopedics;  Laterality: Left;   ORCHIECTOMY Left 02/24/2015   Procedure: SCROTAL EXPLORATION WITH LEFT ORCHIECTOMY;  Surgeon: Kathie Rhodes, MD;  Location: [place];  Service: Urology;  Laterality: Left;   PERIPHERAL VASCULAR INTERVENTION Left 02/26/2021   Procedure: PERIPHERAL VASCULAR INTERVENTION;   Surgeon: Cherre Robins, MD;  Location: [facility];  Service: Cardiovascular;  Laterality: Left;  Superficial femoral   POSTERIOR CERVICAL FUSION/FORAMINOTOMY N/A 01/28/2017   Procedure: POSTERIOR SPINAL FUSION CERVICAL 3-4, CERVICAL 4-5, CERVICAL 5-6, CERVICAL 6-7 WITH INSTRUMENATION AND ALLOGRAFT;  Surgeon: Phylliss Bob, MD;  Location: [place];  Service: Orthopedics;  Laterality: N/A;  POSTERIOR SPINAL FUSION CERVICAL 3-4, CERVICAL 4-5, CERVICAL 5-6, CERVICAL 6-7 WITH INSTRUMENATION AND ALLOGRAFT   SHOULDER ARTHROSCOPY/ DEBRIDEMENT LABRAL TEAR/  BICEPS TENOTOMY Left 09-24-2011   TONSILLECTOMY  as child   TRANSTHORACIC ECHOCARDIOGRAM  02-21-2010   normal LVF/  ef 55-60%/ mild to moderate MR/  moderate LAE/  mild TR   YAG LASER APPLICATION Right [id number]   Procedure: YAG LASER APPLICATION;  Surgeon: Myrtha Mantis., MD;  Location: [facility];  Service: Ophthalmology;  Laterality: Right;   YAG LASER CAPSULOTOMY, LEFT EYE  01-08-2011    Social History   Socioeconomic History   Marital status: Married    Spouse name: Not on file  Number of children: 3   Years of education: 12   Highest education level: High school graduate  Occupational History   Occupation: Retired - poured concrete  Tobacco Use   Smoking status: Every Day    Packs/day: 1.00    Years: 59.00    Pack years: 59.00    Types: Cigarettes   Smokeless tobacco: Never   Tobacco comments:    Started Chantix 10/26/16  Vaping Use   Vaping Use: Never used  Substance and Sexual Activity   Alcohol use: No    Comment: none since approx 2014- heavy before   Drug use: No   Sexual activity: Not on file  Other Topics Concern   Not on file  Social History Narrative   Lives with wife and 2 of his children in a one story home.  Has 3 children.  Retired Counsellor.  Education: high school.   Right Handed   Social Determinants of Health   Financial Resource Strain: Not on file  Food Insecurity: Not on file   Transportation Needs: Not on file  Physical Activity: Not on file  Stress: Not on file  Social Connections: Not on file  Intimate Partner Violence: Not on file    Family History  Problem Relation Age of Onset   Unexplained death Mother        died at a young age, no known cause   Heart attack Father        age unknown   Diabetes Other    Cancer Other     Current Outpatient Medications  Medication Sig Dispense Refill   acetaminophen (TYLENOL) 325 MG tablet Take 2 tablets (650 mg total) by mouth every 4 (four) hours as needed for mild pain (or temp > 37.5 C (99.5 F)).     amitriptyline (ELAVIL) 25 MG tablet Take 10 mg by mouth at bedtime.     AZOPT 1 % ophthalmic suspension Place 1 drop into both eyes 3 (three) times daily.     bimatoprost (LUMIGAN) 0.01 % SOLN Place 1 drop into both eyes at bedtime. 30 mL 3   brimonidine (ALPHAGAN) 0.2 % ophthalmic solution Place 1 drop into both eyes 2 (two) times daily.     Brinzolamide-Brimonidine (SIMBRINZA) 1-0.2 % SUSP Place 1 drop into both eyes 3 (three) times daily.     DULoxetine (CYMBALTA) 60 MG capsule Take 60 mg by mouth daily.     esomeprazole (NEXIUM) 40 MG capsule Take 1 capsule (40 mg total) by mouth daily. (Patient taking differently: Take 40 mg by mouth daily as needed (acid reflux/indigestion.).) 30 capsule 2   ferrous sulfate 325 (65 FE) MG EC tablet Take 325 mg by mouth 3 (three) times daily with meals.     gabapentin (NEURONTIN) 300 MG capsule Take 300 mg by mouth daily.     hydrochlorothiazide (HYDRODIURIL) 12.5 MG tablet Take 12.5 mg by mouth daily.     meclizine (ANTIVERT) 25 MG tablet Take 25 mg by mouth daily as needed for dizziness.     MYRBETRIQ 25 MG TB24 tablet Take 25 mg by mouth daily.     Omega-3 Fatty Acids (FISH OIL) 1000 MG CPDR Take 1,000 mg by mouth daily.     oxybutynin (DITROPAN) 5 MG tablet Take 10 mg by mouth every morning.     pravastatin (PRAVACHOL) 80 MG tablet Take 80 mg by mouth daily.   2    tamsulosin (FLOMAX) 0.4 MG CAPS capsule Take 0.4 mg by mouth daily.  No current facility-administered medications for this visit.    Allergies  Allergen Reactions   Tramadol     Confusion (intolerance)     REVIEW OF SYSTEMS:  '[X]'$  denotes positive finding, '[ ]'$  denotes negative finding Cardiac  Comments:  Chest pain or chest pressure:    Shortness of breath upon exertion:    Short of breath when lying flat:    Irregular heart rhythm:        Vascular    Pain in calf, thigh, or hip brought on by ambulation:    Pain in feet at night that wakes you up from your sleep:     Blood clot in your veins:    Leg swelling:         Pulmonary    Oxygen at home:    Productive cough:     Wheezing:         Neurologic    Sudden weakness in arms or legs:     Sudden numbness in arms or legs:     Sudden onset of difficulty speaking or slurred speech:    Temporary loss of vision in one eye:     Problems with dizziness:         Gastrointestinal    Blood in stool:     Vomited blood:         Genitourinary    Burning when urinating:     Blood in urine:        Psychiatric    Major depression:         Hematologic    Bleeding problems:    Problems with blood clotting too easily:        Skin    Rashes or ulcers:        Constitutional    Fever or chills:      PHYSICAL EXAMINATION:  Vitals:   09/02/21 1503  BP: 112/67  Pulse: 64  Resp: 20  Temp: 98.3 F (36.8 C)  SpO2: 100%  Weight: 157 lb (71.2 kg)  Height: '6\' 2"'$  (1.88 m)    General:  WDWN in NAD; vital signs documented above Gait: Normal HENT: WNL, normocephalic Pulmonary: normal non-labored breathing , without wheezing Cardiac: regular HR, without  Murmurs without carotid bruit Abdomen: soft, NT, no masses Vascular Exam/Pulses: 2+ radial pulses, 2+ femoral pulses bilaterally, no palpable distal pulses Extremities: without ischemic changes, without Gangrene , without cellulitis; without open wounds;   Musculoskeletal: no muscle wasting or atrophy  Neurologic: A&O X 3;  No focal weakness or paresthesias are detected Psychiatric:  The pt has Normal affect.   Non-Invasive Vascular Imaging:   +-------+-----------+-----------+------------+------------+  ABI/TBIToday's ABIToday's TBIPrevious ABIPrevious TBI  +-------+-----------+-----------+------------+------------+  Right  0.91       0.87       1.05        0.79          +-------+-----------+-----------+------------+------------+  Left   0.89       0.34       1.03        0.33          +-------+-----------+-----------+------------+------------+   Stenting patent by duplex  ASSESSMENT/PLAN::  ROYALE HANKINS is a 80 y.o. male with atherosclerosis of native arteries of right lower extremity causing no symptoms.  Recommend the following which can slow the progression of atherosclerosis and reduce the risk of major adverse cardiac / limb events:  Complete cessation from all tobacco products. Blood glucose control with goal A1c < 7%.  Blood pressure control with goal blood pressure < 140/90 mmHg. Lipid reduction therapy with goal LDL-C <100 mg/dL (<70 if symptomatic from PAD).  Aspirin '81mg'$  PO QD.  Atorvastatin 40-'80mg'$  PO QD (or other "high intensity" statin therapy).  I counseled the patient that he should avoid injury to his foot or foot surgery at all cost.  While he did have a good result from intervention, does not have inline flow to his foot.  His toe pressure is marginal.  If he can avoid surgery he should.  Will discuss with Dr. Milinda Pointer.  Follow-up with [place] in 6 to 12 months with repeat ABI.  Yevonne Aline. Stanford Breed, MD [facility] Phone Number: [phone] 09/02/2021 4:40 PM

## 2021-09-04 ENCOUNTER — Other Ambulatory Visit: Payer: Self-pay

## 2021-09-04 DIAGNOSIS — I739 Peripheral vascular disease, unspecified: Secondary | ICD-10-CM

## 2021-09-09 ENCOUNTER — Emergency Department (HOSPITAL_COMMUNITY)
Admission: EM | Admit: 2021-09-09 | Discharge: 2021-09-09 | Discharge status: Against Medical Advice | Payer: Medicare Other | Attending: Emergency Medicine | Admitting: Emergency Medicine

## 2021-09-09 DIAGNOSIS — R55 Syncope and collapse: Secondary | ICD-10-CM | POA: Diagnosis not present

## 2021-09-09 DIAGNOSIS — Z5321 Procedure and treatment not carried out due to patient leaving prior to being seen by health care provider: Secondary | ICD-10-CM | POA: Diagnosis not present

## 2021-09-09 LAB — CBC
HCT: 27.6 % — ABNORMAL LOW (ref 39.0–52.0)
Hemoglobin: 8 g/dL — ABNORMAL LOW (ref 13.0–17.0)
MCH: 24 pg — ABNORMAL LOW (ref 26.0–34.0)
MCHC: 29 g/dL — ABNORMAL LOW (ref 30.0–36.0)
MCV: 82.6 fL (ref 80.0–100.0)
Platelets: 389 10*3/uL (ref 150–400)
RBC: 3.34 MIL/uL — ABNORMAL LOW (ref 4.22–5.81)
RDW: 21.9 % — ABNORMAL HIGH (ref 11.5–15.5)
WBC: 9.3 10*3/uL (ref 4.0–10.5)
nRBC: 0 % (ref 0.0–0.2)

## 2021-09-09 LAB — BASIC METABOLIC PANEL
Anion gap: 12 (ref 5–15)
BUN: 29 mg/dL — ABNORMAL HIGH (ref 8–23)
CO2: 20 mmol/L — ABNORMAL LOW (ref 22–32)
Calcium: 9.4 mg/dL (ref 8.9–10.3)
Chloride: 104 mmol/L (ref 98–111)
Creatinine, Ser: 2.06 mg/dL — ABNORMAL HIGH (ref 0.61–1.24)
GFR, Estimated: 32 mL/min — ABNORMAL LOW (ref >60)
Glucose, Bld: 102 mg/dL — ABNORMAL HIGH (ref 70–99)
Potassium: 4.3 mmol/L (ref 3.5–5.1)
Sodium: 136 mmol/L (ref 135–145)

## 2021-09-09 NOTE — ED Provider Notes (Signed)
Emergency Medicine Provider Triage Evaluation Note  RONON FERGER , a 80 y.o. male  was evaluated in triage.  Pt presents from home via EMS with chief complaint of syncope. Pt was walking outside when he felt faint and passed out. Witnessed fall, no head trauma. Minimal PO intake today. Uses walker at baseline. Complaining of left arm pain. No other complaints.   Review of Systems  Positive: Syncope, left arm pain Negative: Head trauma, CP, SOB  Physical Exam  BP (!) 95/55 (BP Location: Left Arm)   Pulse 67   Temp 98.5 F (36.9 C) (Oral)   Resp 16   SpO2 100%  Gen:   Awake, no distress   Resp:  Normal effort  MSK:   Moves extremities without difficulty  Other:    Medical Decision Making  Medically screening exam initiated at 2:11 PM.  Appropriate orders placed.  [name] was informed that the remainder of the evaluation will be completed by another provider, this initial triage assessment does not replace that evaluation, and the importance of remaining in the ED until their evaluation is complete.     Estill Cotta 09/09/21 1414    Wyvonnia Dusky, MD 09/09/21 [phone]

## 2021-09-09 NOTE — ED Triage Notes (Signed)
Pt arrived by EMS, pt had witnessed syncopal event at home. Pt states he was outside all morning doing yard work and has not had anything to drink today  Pt states this happened several months ago as well  EMS reports initial pressure 89/56 after 500cc bolus pressure is 104/59

## 2021-09-09 NOTE — ED Notes (Signed)
Pt called for vitals, no response. 

## 2021-09-15 ENCOUNTER — Emergency Department (HOSPITAL_COMMUNITY): Payer: Medicare Other

## 2021-09-15 ENCOUNTER — Other Ambulatory Visit: Payer: Self-pay

## 2021-09-15 ENCOUNTER — Observation Stay (HOSPITAL_COMMUNITY)
Admission: EM | Admit: 2021-09-15 | Discharge: 2021-09-16 | Disposition: A | Discharge status: Home / Self Care | Payer: Medicare Other | Attending: Internal Medicine | Admitting: Internal Medicine

## 2021-09-15 DIAGNOSIS — I959 Hypotension, unspecified: Secondary | ICD-10-CM

## 2021-09-15 DIAGNOSIS — I10 Essential (primary) hypertension: Secondary | ICD-10-CM | POA: Diagnosis present

## 2021-09-15 DIAGNOSIS — N183 Chronic kidney disease, stage 3 unspecified: Secondary | ICD-10-CM

## 2021-09-15 DIAGNOSIS — I739 Peripheral vascular disease, unspecified: Secondary | ICD-10-CM | POA: Diagnosis present

## 2021-09-15 DIAGNOSIS — F17210 Nicotine dependence, cigarettes, uncomplicated: Secondary | ICD-10-CM | POA: Insufficient documentation

## 2021-09-15 DIAGNOSIS — N179 Acute kidney failure, unspecified: Secondary | ICD-10-CM | POA: Diagnosis present

## 2021-09-15 DIAGNOSIS — Z8546 Personal history of malignant neoplasm of prostate: Secondary | ICD-10-CM | POA: Insufficient documentation

## 2021-09-15 DIAGNOSIS — I2510 Atherosclerotic heart disease of native coronary artery without angina pectoris: Secondary | ICD-10-CM | POA: Insufficient documentation

## 2021-09-15 DIAGNOSIS — S065XAA Traumatic subdural hemorrhage with loss of consciousness status unknown, initial encounter: Secondary | ICD-10-CM

## 2021-09-15 DIAGNOSIS — Z79899 Other long term (current) drug therapy: Secondary | ICD-10-CM | POA: Insufficient documentation

## 2021-09-15 DIAGNOSIS — E119 Type 2 diabetes mellitus without complications: Secondary | ICD-10-CM

## 2021-09-15 DIAGNOSIS — S065X9A Traumatic subdural hemorrhage with loss of consciousness of unspecified duration, initial encounter: Secondary | ICD-10-CM

## 2021-09-15 DIAGNOSIS — F1010 Alcohol abuse, uncomplicated: Secondary | ICD-10-CM | POA: Diagnosis not present

## 2021-09-15 DIAGNOSIS — I6201 Nontraumatic acute subdural hemorrhage: Secondary | ICD-10-CM | POA: Diagnosis not present

## 2021-09-15 DIAGNOSIS — J449 Chronic obstructive pulmonary disease, unspecified: Secondary | ICD-10-CM | POA: Insufficient documentation

## 2021-09-15 DIAGNOSIS — I129 Hypertensive chronic kidney disease with stage 1 through stage 4 chronic kidney disease, or unspecified chronic kidney disease: Secondary | ICD-10-CM | POA: Diagnosis not present

## 2021-09-15 DIAGNOSIS — E1122 Type 2 diabetes mellitus with diabetic chronic kidney disease: Secondary | ICD-10-CM | POA: Diagnosis not present

## 2021-09-15 DIAGNOSIS — I6203 Nontraumatic chronic subdural hemorrhage: Secondary | ICD-10-CM

## 2021-09-15 DIAGNOSIS — N1832 Chronic kidney disease, stage 3b: Secondary | ICD-10-CM | POA: Diagnosis present

## 2021-09-15 DIAGNOSIS — R0602 Shortness of breath: Secondary | ICD-10-CM

## 2021-09-15 HISTORY — DX: Nontraumatic chronic subdural hemorrhage: I62.03

## 2021-09-15 LAB — CBC WITH DIFFERENTIAL/PLATELET
Abs Immature Granulocytes: 0.05 10*3/uL (ref 0.00–0.07)
Basophils Absolute: 0.1 10*3/uL (ref 0.0–0.1)
Basophils Relative: 1 %
Eosinophils Absolute: 0.1 10*3/uL (ref 0.0–0.5)
Eosinophils Relative: 1 %
HCT: 25.6 % — ABNORMAL LOW (ref 39.0–52.0)
Hemoglobin: 8 g/dL — ABNORMAL LOW (ref 13.0–17.0)
Immature Granulocytes: 1 %
Lymphocytes Relative: 12 %
Lymphs Abs: 1.2 10*3/uL (ref 0.7–4.0)
MCH: 24.6 pg — ABNORMAL LOW (ref 26.0–34.0)
MCHC: 31.3 g/dL (ref 30.0–36.0)
MCV: 78.8 fL — ABNORMAL LOW (ref 80.0–100.0)
Monocytes Absolute: 0.7 10*3/uL (ref 0.1–1.0)
Monocytes Relative: 7 %
Neutro Abs: 8 10*3/uL — ABNORMAL HIGH (ref 1.7–7.7)
Neutrophils Relative %: 78 %
Platelets: 371 10*3/uL (ref 150–400)
RBC: 3.25 MIL/uL — ABNORMAL LOW (ref 4.22–5.81)
RDW: 21.2 % — ABNORMAL HIGH (ref 11.5–15.5)
WBC: 10.1 10*3/uL (ref 4.0–10.5)
nRBC: 0 % (ref 0.0–0.2)

## 2021-09-15 LAB — COMPREHENSIVE METABOLIC PANEL
ALT: 12 U/L (ref 0–44)
AST: 13 U/L — ABNORMAL LOW (ref 15–41)
Albumin: 3.5 g/dL (ref 3.5–5.0)
Alkaline Phosphatase: 41 U/L (ref 38–126)
Anion gap: 7 (ref 5–15)
BUN: 37 mg/dL — ABNORMAL HIGH (ref 8–23)
CO2: 21 mmol/L — ABNORMAL LOW (ref 22–32)
Calcium: 9 mg/dL (ref 8.9–10.3)
Chloride: 109 mmol/L (ref 98–111)
Creatinine, Ser: 2.42 mg/dL — ABNORMAL HIGH (ref 0.61–1.24)
GFR, Estimated: 26 mL/min — ABNORMAL LOW (ref >60)
Glucose, Bld: 109 mg/dL — ABNORMAL HIGH (ref 70–99)
Potassium: 4.7 mmol/L (ref 3.5–5.1)
Sodium: 137 mmol/L (ref 135–145)
Total Bilirubin: 0.6 mg/dL (ref 0.3–1.2)
Total Protein: 6.3 g/dL — ABNORMAL LOW (ref 6.5–8.1)

## 2021-09-15 LAB — APTT: aPTT: 26 seconds (ref 24–36)

## 2021-09-15 LAB — LACTIC ACID, PLASMA: Lactic Acid, Venous: 1.5 mmol/L (ref 0.5–1.9)

## 2021-09-15 MED ORDER — SODIUM CHLORIDE 0.9 % IV BOLUS
1000.0000 mL | Freq: Once | INTRAVENOUS | Status: AC
  Administered 2021-09-15: 1000 mL via INTRAVENOUS

## 2021-09-15 MED ORDER — LACTATED RINGERS IV BOLUS
1000.0000 mL | Freq: Once | INTRAVENOUS | Status: AC
  Administered 2021-09-15: 1000 mL via INTRAVENOUS

## 2021-09-15 NOTE — ED Triage Notes (Signed)
Pt BIB [facility] from home for hypotension. Pt had a recent change in BP meds and may have taken both Rx. Pt c/o dizziness and epigastric pain, worse with palpation. EMS reports slight abdominal distention.   EMS VS- original BP 88/50, last BP 96/56, HR 65, SpO2 100%, CBG 139, RR 16

## 2021-09-15 NOTE — Subjective & Objective (Signed)
CC: hypotension HPI: 80 year old African-American male with a history of chronic subdural hematomas, chronic alcohol abuse, reported history of hypertension, peripheral arterial disease presented to the ER today via EMS.  Patient states that he was sitting outside in his truck because his house was too cold.  He states that his wife keeps air conditioner on but he does not like the cold.  He sat in his truck because it was warmer.  Reportedly EMS was called out to the house.  Unknown reason why.  Reportedly blood pressure via EMS was 88/50.  Patient received fluid bolus in the ER.  Patient states that he felt "last week sometime".  CT scan of the head today showed slight increase in the bilateral parietal subdural hematomas.  Neurosurgery was consulted and recommended repeat CT in the morning along with hospital admission.  Patient also has a mild AKI.  He is received IV fluids.  [facility] hospitalist called for admission.  Patient otherwise uncooperative with history of present illness or review of systems.

## 2021-09-15 NOTE — Assessment & Plan Note (Signed)
With reported hypotension prior to arrival to ER and with AKI on CKD, will hold HTN meds for now.

## 2021-09-15 NOTE — Assessment & Plan Note (Signed)
Admit to medical bed. EDP discussed case with neurosurgery([facility]) who recommended repeat CT head in AM to assess stability of increased size of subdural hematoma.

## 2021-09-15 NOTE — Assessment & Plan Note (Signed)
- 

## 2021-09-15 NOTE — Assessment & Plan Note (Signed)
Check A1c -SSI 

## 2021-09-15 NOTE — ED Provider Notes (Signed)
[facility] EMERGENCY DEPARTMENT Provider Note   CSN: [id number] Arrival date & time: 09/15/21  1610     History Chief Complaint  Patient presents with   Hypotension    Russell [name] is a 80 y.o. male.  HPI This is a 80 year old male with extensive past medical history listed below including COPD, hypertension, alcohol use disorder, SDH, seizures, frequent falls and syncope who presents with dizziness.  Patient reports lightheadedness and dizziness that started this morning.  Denies any loss of consciousness or fall today, but did have a fall 2 days ago.  Unsure of head injury but endorses LOC.  Denies preceding chest pain, shortness of breath, palpitations.  Denies fever or other infectious symptoms.  Has been tolerating p.o. without vomiting or diarrhea.  EMS reports hypotension with systolic blood pressure in the 80s on their arrival.  Given 500 cc of fluids with improvement.  Per EMS wife reported a recent medication change with new antihypertensive regimen, but patient is unsure of medications or compliance.     Past Medical History:  Diagnosis Date   AKI (acute kidney injury) ([place]) 06/23/2017   Arthritis    SHOUDLERS, LUMBAR   BPH with obstruction/lower urinary tract symptoms    BPH with urinary obstruction 01/15/2016   COPD (chronic obstructive pulmonary disease) ([place])    Coronary atherosclerosis    denied knowledge of this 10/27/16   Depression    Diverticulosis of colon    Dyspnea    ED (erectile dysfunction)    Full dentures    GERD (gastroesophageal reflux disease)    not current   Glaucoma    History of gastritis    History of scabies    2008   History of seizure    2013-- x2   idiopathic --  none since   History of syncope    03-22-2014  DX  VAGAL RESPONSE   Hyperlipidemia, mixed    Hypertension    Mitral regurgitation    Peripheral arterial disease ([place])    one vessel runoff bilaterally via peroneal arteries   Prostate cancer  ([place])    Seizures ([place]) 03/2014   ? or syncope- patient refused to be admitted for further studies- "felt better"   Type 2 diabetes, diet controlled ([place])    patient and his wife said no- have not been told 01/21/17   Wears glasses     Patient Active Problem List   Diagnosis Date Noted   Scalp laceration 07/09/2021   ABLA (acute blood loss anemia) 07/09/2021   Cognitive communication deficit 07/09/2021   Intracranial bleed ([place]) 07/07/2021   Fall at home, initial encounter    Traumatic hemorrhage of cerebrum without loss of consciousness ([place])    Facial fracture ([place])    Osteoarthritis 05/31/2018   Foot pain, left 05/11/2018   Chest pain 06/23/2017   AKI (acute kidney injury) ([place]) 06/23/2017   Acute respiratory insufficiency    Surgery, elective    Peripheral neuropathy 01/27/2017   Cervical radiculopathy 01/27/2017   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   Peripheral arterial disease ([place]) 01/23/2016   SLAC (scapholunate advanced collapse) of wrist 11/13/2015   Fever 02/22/2015   Epididymitis, left 02/22/2015   Cellulitis 02/22/2015   Orchitis 02/22/2015   Testicular abscess 02/22/2015   DM type 2 (diabetes mellitus, type 2) ([place]) 02/22/2015   Anemia 02/22/2015   Epididymitis 02/22/2015   Acute lower UTI 02/22/2015   Hypotension 03/22/2014  Neuroma digital nerve 09/28/2013   Neuroma, 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   SEIZURE, 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   Dehydration 07/03/2010   NAUSEA AND VOMITING 07/03/2010   BENIGN PROSTATIC HYPERTROPHY, WITH URINARY OBSTRUCTION 06/13/2010   ARM PAIN 06/03/2010   DYSPNEA ON EXERTION 05/15/2010   TOBACCO ABUSE 04/03/2010   COPD 04/03/2010   G E R D 04/03/2010   OTHER DYSPHAGIA 04/03/2010   PNEUMONIA, RIGHT LOWER LOBE 02/19/2010   DIABETES MELLITUS, TYPE II, CONTROLLED 01/14/2010   LUMBAR RADICULOPATHY, LEFT 05/16/2009   DYSPHAGIA UNSPECIFIED 05/16/2009   COUGH DUE TO ACE  INHIBITORS 04/02/2009   DIZZINESS 07/27/2008   HYPERCHOLESTEROLEMIA, MIXED 05/11/2008   CATARACTS, BILATERAL 03/01/2008   MILD SPINAL STENOSIS, CERVICAL 01/12/2008   LEG CRAMPS 01/12/2008   EARLY SATIETY 11/04/2007   DIVERTICULOSIS, COLON 08/31/2007   SCABIES 08/26/2007   ERECTILE DYSFUNCTION 08/18/2007   ABUSE, ALCOHOL SUSPECTED 08/18/2007   Essential hypertension 08/17/2007   HEMOCCULT POSITIVE STOOL 07/05/2007   ANEMIA, IRON DEFICIENCY NOS 05/31/2007   WEIGHT LOSS, ABNORMAL 05/10/2006    Past Surgical History:  Procedure Laterality Date   ABDOMINAL AORTOGRAM W/LOWER EXTREMITY Left 12/18/2020   Procedure: ABDOMINAL AORTOGRAM W/LOWER EXTREMITY;  Surgeon: Cherre Robins, MD;  Location: [place] [facility];  Service: Cardiovascular;  Laterality: Left;   ABDOMINAL AORTOGRAM W/LOWER EXTREMITY N/A 02/26/2021   Procedure: ABDOMINAL AORTOGRAM W/LOWER EXTREMITY;  Surgeon: Cherre Robins, MD;  Location: [place] [facility];  Service: Cardiovascular;  Laterality: N/A;   ANTERIOR CERVICAL DECOMP/DISCECTOMY FUSION  10-16-2009   C4 -- C6   ANTERIOR CERVICAL DECOMP/DISCECTOMY FUSION N/A 01/27/2017   Procedure: ANTERIOR CERVICAL DECOMPRESSION FUSION CERVICAL 3-4 WITH INSTRUMENTATION AND ALLOGRAFT;  Surgeon: Phylliss Bob, MD;  Location: [place];  Service: Orthopedics;  Laterality: N/A;  ANTERIOR CERVICAL DECOMPRESSION FUSION CERVICAL 3-4 WITH INSTRUMENTATION AND ALLOGRAFT   CAPSULOTOMY Right 01/26/2013   Procedure: MINOR CAPSULOTOMY;  Surgeon: Myrtha Mantis., MD;  Location: [place];  Service: Ophthalmology;  Laterality: Right;   CARPAL TUNNEL RELEASE Left 01/02/2019   Procedure: LEFT CARPAL TUNNEL RELEASE;  Surgeon: Leanora Cover, MD;  Location: [place];  Service: Orthopedics;  Laterality: Left;   CARPECTOMY Right 03/13/2014   Procedure: RIGHT  PROXIMAL ROW CARPECTOMY;  Surgeon: Tennis Must, MD;  Location: [place];  Service: Orthopedics;  Laterality: Right;    CARPECTOMY Left 10/22/2015   Procedure: LEFT WRIST PROXIMAL ROW CARPECTOMY ;  Surgeon: Leanora Cover, MD;  Location: [place];  Service: Orthopedics;  Laterality: Left;   CARPOMETACARPAL ([place]) FUSION OF THUMB Right 03/13/2014   Procedure: RIGHT FUSION OF THUMB CARPOMETACARPAL [facility]) JOINT;  Surgeon: Tennis Must, MD;  Location: [place];  Service: Orthopedics;  Laterality: Right;   CARPOMETACARPEL SUSPENSION PLASTY Left 04/04/2020   Procedure: LEFT THUMB TRAPECIECTOMY AND SUSPENSIONPLASTY;  Surgeon: Leanora Cover, MD;  Location: [place];  Service: Orthopedics;  Laterality: Left;   CATARACT EXTRACTION W/ INTRAOCULAR LENS  IMPLANT, BILATERAL  2009   COLONOSCOPY  08-31-2007   COLONOSCOPY     CRYOABLATION N/A 01/14/2015   Procedure: CRYO ABLATION PROSTATE;  Surgeon: Ailene Rud, MD;  Location[facility];  Service: Urology;  Laterality: N/A;   ESOPHAGOGASTRODUODENOSCOPY  last one 09-11-2008   EXCISION MASS LEFT FACE , SUBORBITAL AREA, PLASTER RECONSTRUCTION  02-09-2011   EXCISIONAL DEBRIDEMENT AND REPAIR RIGHT QUADRICEP TENDON  07-05-2000   FOOT SURGERY Left    I & D EXTREMITY Right 05/24/2013  Procedure: RIGHT INDEX WOUND DEBRIDEMENT AND CLOSURE;  Surgeon: Jolyn Nap, MD;  Location: [place];  Service: Orthopedics;  Laterality: Right;   INSERTION OF SUPRAPUBIC CATHETER N/A 01/14/2015   Procedure: SUPRAPUBIC TUBE PLACEMENT;  Surgeon: Ailene Rud, MD;  Location: [facility];  Service: Urology;  Laterality: N/A;   KNEE ARTHROSCOPY Right 2008   LARYNGOSCOPY AND ESOPHAGOSCOPY  06-13-2010   MARSUPIALIZATION LEFT LARGE VALLECULAR CYST   MASS EXCISION Left 10/22/2015   Procedure: EXCISION MASS OF LEFT INDEX FINGER;  Surgeon: Leanora Cover, MD;  Location: [place];  Service: Orthopedics;  Laterality: Left;   ORCHIECTOMY Left 02/24/2015   Procedure: SCROTAL EXPLORATION WITH LEFT  ORCHIECTOMY;  Surgeon: Kathie Rhodes, MD;  Location: [place];  Service: Urology;  Laterality: Left;   PERIPHERAL VASCULAR INTERVENTION Left 02/26/2021   Procedure: PERIPHERAL VASCULAR INTERVENTION;  Surgeon: Cherre Robins, MD;  Location: [place] [facility];  Service: Cardiovascular;  Laterality: Left;  Superficial femoral   POSTERIOR CERVICAL FUSION/FORAMINOTOMY N/A 01/28/2017   Procedure: POSTERIOR SPINAL FUSION CERVICAL 3-4, CERVICAL 4-5, CERVICAL 5-6, CERVICAL 6-7 WITH INSTRUMENATION AND ALLOGRAFT;  Surgeon: Phylliss Bob, MD;  Location: [place];  Service: Orthopedics;  Laterality: N/A;  POSTERIOR SPINAL FUSION CERVICAL 3-4, CERVICAL 4-5, CERVICAL 5-6, CERVICAL 6-7 WITH INSTRUMENATION AND ALLOGRAFT   SHOULDER ARTHROSCOPY/ DEBRIDEMENT LABRAL TEAR/  BICEPS TENOTOMY Left 09-24-2011   TONSILLECTOMY  as child   TRANSTHORACIC ECHOCARDIOGRAM  02-21-2010   normal LVF/  ef 55-60%/ mild to moderate MR/  moderate LAE/  mild TR   YAG LASER APPLICATION Right 4/0/9811   Procedure: YAG LASER APPLICATION;  Surgeon: Myrtha Mantis., MD;  Location: [facility];  Service: Ophthalmology;  Laterality: Right;   YAG LASER CAPSULOTOMY, LEFT EYE  01-08-2011       Family History  Problem Relation Age of Onset   Unexplained death Mother        died at a young age, no known cause   Heart attack Father        age unknown   Diabetes Other    Cancer Other     Social History   Tobacco Use   Smoking status: Every Day    Packs/day: 1.00    Years: 59.00    Pack years: 59.00    Types: Cigarettes   Smokeless tobacco: Never   Tobacco comments:    Started Chantix 10/26/16  Vaping Use   Vaping Use: Never used  Substance Use Topics   Alcohol use: No    Comment: none since approx 2014- heavy before   Drug use: No    Home Medications Prior to Admission medications   Medication Sig Start Date End Date Taking? Authorizing Provider  acetaminophen (TYLENOL) 325 MG tablet Take 2 tablets (650 mg total) by mouth every 4  (four) hours as needed for mild pain (or temp > 37.5 C (99.5 F)). 07/10/21   Samuella Cota, MD  amitriptyline (ELAVIL) 25 MG tablet Take 10 mg by mouth at bedtime. 07/22/20   [provider]  AZOPT 1 % ophthalmic suspension Place 1 drop into both eyes 3 (three) times daily. 12/25/16   [provider]  bimatoprost (LUMIGAN) 0.01 % SOLN Place 1 drop into both eyes at bedtime. 04/10/13   Dhungel, Nishant, MD  brimonidine (ALPHAGAN) 0.2 % ophthalmic solution Place 1 drop into both eyes 2 (two) times daily. 03/14/19   [provider]  Brinzolamide-Brimonidine (SIMBRINZA) 1-0.2 % SUSP Place 1  drop into both eyes 3 (three) times daily.    [provider]  DULoxetine (CYMBALTA) 60 MG capsule Take 60 mg by mouth daily.    [provider]  esomeprazole (NEXIUM) 40 MG capsule Take 1 capsule (40 mg total) by mouth daily. Patient taking differently: Take 40 mg by mouth daily as needed (acid reflux/indigestion.). 07/16/17   Isaiah Serge, NP  ferrous sulfate 325 (65 FE) MG EC tablet Take 325 mg by mouth 3 (three) times daily with meals.    [provider]  gabapentin (NEURONTIN) 300 MG capsule Take 300 mg by mouth daily. 02/25/21   [provider]  hydrochlorothiazide (HYDRODIURIL) 12.5 MG tablet Take 12.5 mg by mouth daily. 05/17/21   [provider]  meclizine (ANTIVERT) 25 MG tablet Take 25 mg by mouth daily as needed for dizziness. 06/16/17   [provider]  MYRBETRIQ 25 MG TB24 tablet Take 25 mg by mouth daily. 02/12/20   [provider]  Omega-3 Fatty Acids (FISH OIL) 1000 MG CPDR Take 1,000 mg by mouth daily.    [provider]  oxybutynin (DITROPAN) 5 MG tablet Take 10 mg by mouth every morning. 11/13/19   [provider]  pravastatin (PRAVACHOL) 80 MG tablet Take 80 mg by mouth daily.  10/19/16   [provider]  tamsulosin (FLOMAX) 0.4 MG CAPS capsule Take 0.4 mg by mouth daily. 12/03/16    [provider]    Allergies    Tramadol  Review of Systems   Review of Systems  Constitutional:  Negative for chills and fever.  HENT:  Negative for ear pain and sore throat.   Eyes:  Negative for pain and visual disturbance.  Respiratory:  Negative for cough and shortness of breath.   Cardiovascular:  Negative for chest pain and palpitations.  Gastrointestinal:  Negative for abdominal pain and vomiting.  Genitourinary:  Negative for dysuria and hematuria.  Musculoskeletal:  Negative for arthralgias and back pain.  Skin:  Negative for color change and rash.  Neurological:  Positive for dizziness, syncope and light-headedness. Negative for seizures.  All other systems reviewed and are negative.  Physical Exam Updated Vital Signs BP 120/60   Pulse 65   Temp 98.5 F (36.9 C) (Oral)   Resp 14   Ht 6\' 2"  (1.88 m)   Wt 72.6 kg   SpO2 100%   BMI 20.54 kg/m   Physical Exam Vitals and nursing note reviewed.  Constitutional:      Appearance: He is well-developed.  HENT:     Head: Normocephalic and atraumatic.  Eyes:     Conjunctiva/sclera: Conjunctivae normal.  Cardiovascular:     Rate and Rhythm: Normal rate and regular rhythm.     Heart sounds: No murmur heard. Pulmonary:     Effort: Pulmonary effort is normal. No respiratory distress.     Breath sounds: Normal breath sounds.  Abdominal:     General: There is no distension.     Palpations: Abdomen is soft.     Tenderness: There is no abdominal tenderness. There is no guarding or rebound.  Musculoskeletal:     Cervical back: Neck supple.  Skin:    General: Skin is warm and dry.     Capillary Refill: Capillary refill takes less than 2 seconds.  Neurological:     General: No focal deficit present.     Mental Status: He is alert.     Cranial Nerves: No cranial nerve deficit.  Sensory: No sensory deficit.     Motor: No weakness.    ED Results / Procedures / Treatments   Labs (all labs ordered are  listed, but only abnormal results are displayed) Labs Reviewed  CBC WITH DIFFERENTIAL/PLATELET - Abnormal; Notable for the following components:      Result Value   RBC 3.25 (*)    Hemoglobin 8.0 (*)    HCT 25.6 (*)    MCV 78.8 (*)    MCH 24.6 (*)    RDW 21.2 (*)    Neutro Abs 8.0 (*)    All other components within normal limits  COMPREHENSIVE METABOLIC PANEL - Abnormal; Notable for the following components:   CO2 21 (*)    Glucose, Bld 109 (*)    BUN 37 (*)    Creatinine, Ser 2.42 (*)    Total Protein 6.3 (*)    AST 13 (*)    GFR, Estimated 26 (*)    All other components within normal limits  URINALYSIS, ROUTINE W REFLEX MICROSCOPIC - Abnormal; Notable for the following components:   Leukocytes,Ua TRACE (*)    All other components within normal limits  LACTIC ACID, PLASMA  APTT  LACTIC ACID, PLASMA  PATHOLOGIST SMEAR REVIEW    EKG EKG Interpretation  Date/Time:  Monday September 15 2021 16:12:06 EDT Ventricular Rate:  70 PR Interval:  185 QRS Duration: 104 QT Interval:  462 QTC Calculation: 499 R Axis:   64 Text Interpretation: Sinus rhythm qt manually calculated normal no wpw, prolonged qt or brugada No significant change since last tracing No significant change since last tracing Confirmed by [name] [phone]) on 09/15/2021 4:24:31 PM  Radiology CT HEAD WO CONTRAST (5MM)  Result Date: 09/15/2021 CLINICAL DATA:  Hypotension dizziness EXAM: CT HEAD WITHOUT CONTRAST TECHNIQUE: Contiguous axial images were obtained from the base of the skull through the vertex without intravenous contrast. COMPARISON:  CT brain 07/21/2021, 07/08/2021, 07/07/2021 FINDINGS: Brain: No acute territorial infarction, acute intracranial hemorrhage, or is seen. Mild mineralization in the right basal ganglia. Residual subdural collections at the cranial vertex, though decreased in thickness, measuring 7 mm on the left and 7 mm on the right, previously 11 mm on the left and 10 mm on the right. Tiny  more intermediate to slightly dense blood within the subdural space on coronal views, series 5, image 42 measuring less than 2 mm thickness bilaterally. Atrophy and chronic small vessel ischemic changes of the white matter. Stable ventricle size. Vascular: No hyperdense vessels.  Carotid vascular calcification. Skull: No new fracture is seen. Sinuses/Orbits: Mucous retention cysts right maxillary sinus. Mucosal thickening in the sinuses. Other: Redemonstrated left facial bone fractures involving the left orbit, frontal bone, zygomatic arch and anterior skull base. IMPRESSION: 1. Small residual bilateral subdural collections, primarily at cranial vertex but decreased compared to prior. Trace 1-2 mm more intermediate/slightly dense blood along the bilateral parietal convexities in the region of the more chronic subdural collections, best seen on coronal views suggesting small amounts of acute to subacute blood. These were not seen on the CT from August. No midline shift or mass effect. 2. Atrophy and chronic small vessel ischemic changes of the white matter. 3. Redemonstrated multiple left skull and maxillofacial fractures Critical Value/emergent results were called by telephone at the time of interpretation on 09/15/2021 at 7:06 pm to provider DAN FLOYD , who verbally acknowledged these results. Electronically Signed   By: Donavan Foil M.D.   On: 09/15/2021 19:10   DG CHEST PORT  1 VIEW  Result Date: 09/15/2021 CLINICAL DATA:  Hypotension, dizziness, epigastric pain EXAM: PORTABLE CHEST 1 VIEW COMPARISON:  07/21/2021 FINDINGS: 2 frontal views of the chest demonstrate a stable cardiac silhouette. Stable atherosclerosis and ectasia of the thoracic aorta. No airspace disease, effusion, or pneumothorax. No acute bony abnormalities. IMPRESSION: 1. Stable chest, no acute process. Electronically Signed   By: Randa Ngo M.D.   On: 09/15/2021 18:20    Procedures Procedures   Medications Ordered in ED Medications   sodium chloride 0.9 % bolus 1,000 mL (0 mLs Intravenous Stopped 09/15/21 2313)  lactated ringers bolus 1,000 mL (0 mLs Intravenous Stopped 09/15/21 2313)    ED Course  I have reviewed the triage vital signs and the nursing notes.  Pertinent labs & imaging results that were available during my care of the patient were reviewed by me and considered in my medical decision making (see chart for details).    MDM Rules/Calculators/A&P                          On arrival patient is stable and well-appearing with blood pressure 120/60. Items on the differential for hypotension include infection/sepsis, blood loss, pneumothorax, and volume depletion. Has history of SDH with frequent falls, lightheadedness may also be 2/2 expansion of bleeding, will obtain repeat head CT.   No fever or elevated white blood cell count, no infectious symptoms or signs of infection on exam.  Hemoglobin is stable from prior at 8.  Chest x-ray without pneumonia, pneumothorax, or other cardiopulmonary abnormality.  Creatinine elevated 2.4 from prior of 2 representing likely prerenal AKI, given a liter of fluids.  Repeat CT with evidence of 1 to 2 mm acute component of prior chronic subdural.  Neurosurgery consulted who recommends admission for repeat CT scan in the morning given risk of further expansion.  Patient admitted to the hospitalist in stable condition.   Final Clinical Impression(s) / ED Diagnoses Final diagnoses:  Shortness of breath  Hypotension, unspecified hypotension type  Subdural hematoma [facility])    Rx / DC Orders ED Discharge Orders     None        Coralee Pesa, MD 09/16/21 0021    [name], DO 09/23/21 1935

## 2021-09-15 NOTE — Assessment & Plan Note (Signed)
Stable

## 2021-09-15 NOTE — Assessment & Plan Note (Signed)
Pt with increase in Scr. Has received IVF in ER. Repeat CMP in AM. Hold nephrotoxic agents. No further episodes of hypotension. Only reported hypotension was during EMS transport to ER. No episodes of hypotension noted in ER.

## 2021-09-15 NOTE — H&P (Signed)
History and Physical    Russell [name] GEX:[id number] DOB: Jan 31, 1941 DOA: 09/15/2021  PCP: Marcie Mowers, FNP   Patient coming from: Home  I have personally briefly reviewed patient's old medical records in [place]  CC: hypotension HPI: 80 year old African-American male with a history of chronic subdural hematomas, chronic alcohol abuse, reported history of hypertension, peripheral arterial disease presented to the ER today via EMS.  Patient states that he was sitting outside in his truck because his house was too cold.  He states that his wife keeps air conditioner on but he does not like the cold.  He sat in his truck because it was warmer.  Reportedly EMS was called out to the house.  Unknown reason why.  Reportedly blood pressure via EMS was 88/50.  Patient received fluid bolus in the ER.  Patient states that he felt "last week sometime".  CT scan of the head today showed slight increase in the bilateral parietal subdural hematomas.  Neurosurgery was consulted and recommended repeat CT in the morning along with hospital admission.  Patient also has a mild AKI.  He is received IV fluids.  [facility] hospitalist called for admission.  Patient otherwise uncooperative with history of present illness or review of systems.   ED Course: CT head showed slight increase in SDH. Also showed increase in Scr. Neurosurgery consulted. Recommended pt be admitted and repeat CT head in AM.  Review of Systems:  Review of Systems  Unable to perform ROS: Other  Pt refuses to answer any questions. Only complains about being cold.  Past Medical History:  Diagnosis Date   AKI (acute kidney injury) ([place]) 06/23/2017   Arthritis    SHOUDLERS, LUMBAR   BPH with obstruction/lower urinary tract symptoms    BPH with urinary obstruction 01/15/2016   COPD (chronic obstructive pulmonary disease) ([place])    Coronary atherosclerosis    denied knowledge of this 10/27/16   Depression    Diverticulosis of  colon    Dyspnea    ED (erectile dysfunction)    Full dentures    GERD (gastroesophageal reflux disease)    not current   Glaucoma    History of gastritis    History of scabies    2008   History of seizure    2013-- x2   idiopathic --  none since   History of syncope    03-22-2014  DX  VAGAL RESPONSE   Hyperlipidemia, mixed    Hypertension    Mitral regurgitation    Peripheral arterial disease ([place])    one vessel runoff bilaterally via peroneal arteries   Prostate cancer ([place])    Seizures ([place]) 03/2014   ? or syncope- patient refused to be admitted for further studies- "felt better"   Type 2 diabetes, diet controlled ([place])    patient and his wife said no- have not been told 01/21/17   Wears glasses     Past Surgical History:  Procedure Laterality Date   ABDOMINAL AORTOGRAM W/LOWER EXTREMITY Left 12/18/2020   Procedure: ABDOMINAL AORTOGRAM W/LOWER EXTREMITY;  Surgeon: Cherre Robins, MD;  Location: [facility];  Service: Cardiovascular;  Laterality: Left;   ABDOMINAL AORTOGRAM W/LOWER EXTREMITY N/A 02/26/2021   Procedure: ABDOMINAL AORTOGRAM W/LOWER EXTREMITY;  Surgeon: Cherre Robins, MD;  Location: [facility];  Service: Cardiovascular;  Laterality: N/A;   ANTERIOR CERVICAL DECOMP/DISCECTOMY FUSION  10-16-2009   C4 -- C6   ANTERIOR CERVICAL DECOMP/DISCECTOMY FUSION N/A 01/27/2017   Procedure:  ANTERIOR CERVICAL DECOMPRESSION FUSION CERVICAL 3-4 WITH INSTRUMENTATION AND ALLOGRAFT;  Surgeon: Phylliss Bob, MD;  Location: [facility];  Service: Orthopedics;  Laterality: N/A;  ANTERIOR CERVICAL DECOMPRESSION FUSION CERVICAL 3-4 WITH INSTRUMENTATION AND ALLOGRAFT   CAPSULOTOMY Right 01/26/2013   Procedure: MINOR CAPSULOTOMY;  Surgeon: Myrtha Mantis., MD;  Location: [facility];  Service: Ophthalmology;  Laterality: Right;   CARPAL TUNNEL RELEASE Left 01/02/2019   Procedure: LEFT CARPAL TUNNEL RELEASE;  Surgeon: Leanora Cover, MD;  Location: [facility];  Service:  Orthopedics;  Laterality: Left;   CARPECTOMY Right 03/13/2014   Procedure: RIGHT  PROXIMAL ROW CARPECTOMY;  Surgeon: Tennis Must, MD;  Location: [place];  Service: Orthopedics;  Laterality: Right;   CARPECTOMY Left 10/22/2015   Procedure: LEFT WRIST PROXIMAL ROW CARPECTOMY ;  Surgeon: Leanora Cover, MD;  Location: [place] [facility];  Service: Orthopedics;  Laterality: Left;   CARPOMETACARPAL ([place]) FUSION OF THUMB Right 03/13/2014   Procedure: RIGHT FUSION OF THUMB CARPOMETACARPAL [facility]) JOINT;  Surgeon: Tennis Must, MD;  Location: [place];  Service: Orthopedics;  Laterality: Right;   CARPOMETACARPEL SUSPENSION PLASTY Left 04/04/2020   Procedure: LEFT THUMB TRAPECIECTOMY AND SUSPENSIONPLASTY;  Surgeon: Leanora Cover, MD;  Location: [facility];  Service: Orthopedics;  Laterality: Left;   CATARACT EXTRACTION W/ INTRAOCULAR LENS  IMPLANT, BILATERAL  2009   COLONOSCOPY  08-31-2007   COLONOSCOPY     CRYOABLATION N/A 01/14/2015   Procedure: CRYO ABLATION PROSTATE;  Surgeon: Ailene Rud, MD;  Location: [facility];  Service: Urology;  Laterality: N/A;   ESOPHAGOGASTRODUODENOSCOPY  last one 09-11-2008   EXCISION MASS LEFT FACE , SUBORBITAL AREA, PLASTER RECONSTRUCTION  02-09-2011   EXCISIONAL DEBRIDEMENT AND REPAIR RIGHT QUADRICEP TENDON  07-05-2000   FOOT SURGERY Left    I & D EXTREMITY Right 05/24/2013   Procedure: RIGHT INDEX WOUND DEBRIDEMENT AND CLOSURE;  Surgeon: Jolyn Nap, MD;  Location: [place];  Service: Orthopedics;  Laterality: Right;   INSERTION OF SUPRAPUBIC CATHETER N/A 01/14/2015   Procedure: SUPRAPUBIC TUBE PLACEMENT;  Surgeon: Ailene Rud, MD;  Location: [facility];  Service: Urology;  Laterality: N/A;   KNEE ARTHROSCOPY Right 2008   LARYNGOSCOPY AND ESOPHAGOSCOPY  06-13-2010   MARSUPIALIZATION LEFT LARGE VALLECULAR CYST   MASS EXCISION Left 10/22/2015    Procedure: EXCISION MASS OF LEFT INDEX FINGER;  Surgeon: Leanora Cover, MD;  Location: [place];  Service: Orthopedics;  Laterality: Left;   ORCHIECTOMY Left 02/24/2015   Procedure: SCROTAL EXPLORATION WITH LEFT ORCHIECTOMY;  Surgeon: Kathie Rhodes, MD;  Location: [place];  Service: Urology;  Laterality: Left;   PERIPHERAL VASCULAR INTERVENTION Left 02/26/2021   Procedure: PERIPHERAL VASCULAR INTERVENTION;  Surgeon: Cherre Robins, MD;  Location: [place] [facility];  Service: Cardiovascular;  Laterality: Left;  Superficial femoral   POSTERIOR CERVICAL FUSION/FORAMINOTOMY N/A 01/28/2017   Procedure: POSTERIOR SPINAL FUSION CERVICAL 3-4, CERVICAL 4-5, CERVICAL 5-6, CERVICAL 6-7 WITH INSTRUMENATION AND ALLOGRAFT;  Surgeon: Phylliss Bob, MD;  Location: [place];  Service: Orthopedics;  Laterality: N/A;  POSTERIOR SPINAL FUSION CERVICAL 3-4, CERVICAL 4-5, CERVICAL 5-6, CERVICAL 6-7 WITH INSTRUMENATION AND ALLOGRAFT   SHOULDER ARTHROSCOPY/ DEBRIDEMENT LABRAL TEAR/  BICEPS TENOTOMY Left 09-24-2011   TONSILLECTOMY  as child   TRANSTHORACIC ECHOCARDIOGRAM  02-21-2010   normal LVF/  ef 55-60%/ mild to moderate MR/  moderate LAE/  mild TR   YAG LASER APPLICATION Right 01/28/3150  Procedure: YAG LASER APPLICATION;  Surgeon: Myrtha Mantis., MD;  Location: [place];  Service: Ophthalmology;  Laterality: Right;   YAG LASER CAPSULOTOMY, LEFT EYE  01-08-2011     reports that he has been smoking cigarettes. He has a 59.00 pack-year smoking history. He has never used smokeless tobacco. He reports that he does not drink alcohol and does not use drugs.  Allergies  Allergen Reactions   Tramadol     Confusion (intolerance)    Family History  Problem Relation Age of Onset   Unexplained death Mother        died at a young age, no known cause   Heart attack Father        age unknown   Diabetes Other    Cancer Other     Prior to Admission medications   Medication Sig Start Date End Date Taking?  Authorizing Provider  acetaminophen (TYLENOL) 325 MG tablet Take 2 tablets (650 mg total) by mouth every 4 (four) hours as needed for mild pain (or temp > 37.5 C (99.5 F)). 07/10/21   Samuella Cota, MD  amitriptyline (ELAVIL) 25 MG tablet Take 10 mg by mouth at bedtime. 07/22/20   [provider]  AZOPT 1 % ophthalmic suspension Place 1 drop into both eyes 3 (three) times daily. 12/25/16   [provider]  bimatoprost (LUMIGAN) 0.01 % SOLN Place 1 drop into both eyes at bedtime. 04/10/13   Dhungel, Nishant, MD  brimonidine (ALPHAGAN) 0.2 % ophthalmic solution Place 1 drop into both eyes 2 (two) times daily. 03/14/19   [provider]  Brinzolamide-Brimonidine (SIMBRINZA) 1-0.2 % SUSP Place 1 drop into both eyes 3 (three) times daily.    [provider]  DULoxetine (CYMBALTA) 60 MG capsule Take 60 mg by mouth daily.    [provider]  esomeprazole (NEXIUM) 40 MG capsule Take 1 capsule (40 mg total) by mouth daily. Patient taking differently: Take 40 mg by mouth daily as needed (acid reflux/indigestion.). 07/16/17   Isaiah Serge, NP  ferrous sulfate 325 (65 FE) MG EC tablet Take 325 mg by mouth 3 (three) times daily with meals.    [provider]  gabapentin (NEURONTIN) 300 MG capsule Take 300 mg by mouth daily. 02/25/21   [provider]  hydrochlorothiazide (HYDRODIURIL) 12.5 MG tablet Take 12.5 mg by mouth daily. 05/17/21   [provider]  meclizine (ANTIVERT) 25 MG tablet Take 25 mg by mouth daily as needed for dizziness. 06/16/17   [provider]  MYRBETRIQ 25 MG TB24 tablet Take 25 mg by mouth daily. 02/12/20   [provider]  Omega-3 Fatty Acids (FISH OIL) 1000 MG CPDR Take 1,000 mg by mouth daily.    [provider]  oxybutynin (DITROPAN) 5 MG tablet Take 10 mg by mouth every morning. 11/13/19   [provider]  pravastatin (PRAVACHOL) 80 MG tablet Take 80 mg by mouth daily.  10/19/16    [provider]  tamsulosin (FLOMAX) 0.4 MG CAPS capsule Take 0.4 mg by mouth daily. 12/03/16   [provider]    Physical Exam: Vitals:   09/15/21 1615 09/15/21 1711 09/15/21 1745 09/15/21 1915  BP:  123/66 121/66 121/66  Pulse:  62 72 63  Resp:  15 (!) 22 19  Temp:      TempSrc:      SpO2:  100% 99% 99%  Weight: 72.6 kg     Height: 6\' 2"  (1.88 m)  Physical Exam Vitals and nursing note reviewed.  Constitutional:      General: He is not in acute distress.    Appearance: He is not toxic-appearing or diaphoretic.     Comments: Chronically ill appearing  HENT:     Head: Normocephalic and atraumatic.     Nose: Nose normal. No rhinorrhea.  Eyes:     General:        Right eye: No discharge.        Left eye: No discharge.  Cardiovascular:     Rate and Rhythm: Normal rate and regular rhythm.     Pulses: Normal pulses.     Heart sounds: No murmur heard.   No gallop.  Pulmonary:     Effort: Pulmonary effort is normal. No respiratory distress.     Breath sounds: No stridor. No wheezing or rales.  Abdominal:     General: Abdomen is flat. Bowel sounds are normal. There is no distension.     Tenderness: There is no abdominal tenderness. There is no guarding.  Skin:    General: Skin is warm and dry.     Capillary Refill: Capillary refill takes less than 2 seconds.  Neurological:     General: No focal deficit present.     Mental Status: He is alert and oriented to person, place, and time.  Psychiatric:        Behavior: Behavior is uncooperative and agitated.     Labs on Admission: I have personally reviewed following labs and imaging studies  CBC: Recent Labs  Lab 09/09/21 1414 09/15/21 1715  WBC 9.3 10.1  NEUTROABS  --  8.0*  HGB 8.0* 8.0*  HCT 27.6* 25.6*  MCV 82.6 78.8*  PLT 389 700   Basic Metabolic Panel: Recent Labs  Lab 09/09/21 1414 09/15/21 1715  NA 136 137  K 4.3 4.7  CL 104 109  CO2 20* 21*  GLUCOSE 102* 109*  BUN 29* 37*   CREATININE 2.06* 2.42*  CALCIUM 9.4 9.0   GFR: Estimated Creatinine Clearance: 25 mL/min (A) (by C-G formula based on SCr of 2.42 mg/dL (H)). Liver Function Tests: Recent Labs  Lab 09/15/21 1715  AST 13*  ALT 12  ALKPHOS 41  BILITOT 0.6  PROT 6.3*  ALBUMIN 3.5   No results for input(s): LIPASE, AMYLASE in the last 168 hours. No results for input(s): AMMONIA in the last 168 hours. Coagulation Profile: No results for input(s): INR, PROTIME in the last 168 hours. Cardiac Enzymes: No results for input(s): CKTOTAL, CKMB, CKMBINDEX, TROPONINI in the last 168 hours. BNP (last 3 results) No results for input(s): PROBNP in the last 8760 hours. HbA1C: No results for input(s): HGBA1C in the last 72 hours. CBG: No results for input(s): GLUCAP in the last 168 hours. Lipid Profile: No results for input(s): CHOL, HDL, LDLCALC, TRIG, CHOLHDL, LDLDIRECT in the last 72 hours. Thyroid Function Tests: No results for input(s): TSH, T4TOTAL, FREET4, T3FREE, THYROIDAB in the last 72 hours. Anemia Panel: No results for input(s): VITAMINB12, FOLATE, FERRITIN, TIBC, IRON, RETICCTPCT in the last 72 hours. Urine analysis:    Component Value Date/Time   COLORURINE YELLOW 07/07/2021 1041   APPEARANCEUR CLEAR 07/07/2021 1041   LABSPEC 1.010 07/07/2021 1041   PHURINE 5.0 07/07/2021 1041   GLUCOSEU NEGATIVE 07/07/2021 1041   HGBUR SMALL (A) 07/07/2021 1041   [place] 07/07/2021 1041   KETONESUR NEGATIVE 07/07/2021 1041   PROTEINUR NEGATIVE 07/07/2021 1041   UROBILINOGEN 1.0 02/22/2015 0905   NITRITE NEGATIVE  07/07/2021 1041   LEUKOCYTESUR LARGE (A) 07/07/2021 1041    Radiological Exams on Admission: I have personally reviewed images CT HEAD WO CONTRAST (5MM)  Result Date: 09/15/2021 CLINICAL DATA:  Hypotension dizziness EXAM: CT HEAD WITHOUT CONTRAST TECHNIQUE: Contiguous axial images were obtained from the base of the skull through the vertex without intravenous contrast.  COMPARISON:  CT brain 07/21/2021, 07/08/2021, 07/07/2021 FINDINGS: Brain: No acute territorial infarction, acute intracranial hemorrhage, or is seen. Mild mineralization in the right basal ganglia. Residual subdural collections at the cranial vertex, though decreased in thickness, measuring 7 mm on the left and 7 mm on the right, previously 11 mm on the left and 10 mm on the right. Tiny more intermediate to slightly dense blood within the subdural space on coronal views, series 5, image 42 measuring less than 2 mm thickness bilaterally. Atrophy and chronic small vessel ischemic changes of the white matter. Stable ventricle size. Vascular: No hyperdense vessels.  Carotid vascular calcification. Skull: No new fracture is seen. Sinuses/Orbits: Mucous retention cysts right maxillary sinus. Mucosal thickening in the sinuses. Other: Redemonstrated left facial bone fractures involving the left orbit, frontal bone, zygomatic arch and anterior skull base. IMPRESSION: 1. Small residual bilateral subdural collections, primarily at cranial vertex but decreased compared to prior. Trace 1-2 mm more intermediate/slightly dense blood along the bilateral parietal convexities in the region of the more chronic subdural collections, best seen on coronal views suggesting small amounts of acute to subacute blood. These were not seen on the CT from August. No midline shift or mass effect. 2. Atrophy and chronic small vessel ischemic changes of the white matter. 3. Redemonstrated multiple left skull and maxillofacial fractures Critical Value/emergent results were called by telephone at the time of interpretation on 09/15/2021 at 7:06 pm to provider DAN FLOYD , who verbally acknowledged these results. Electronically Signed   By: Donavan Foil M.D.   On: 09/15/2021 19:10   DG CHEST PORT 1 VIEW  Result Date: 09/15/2021 CLINICAL DATA:  Hypotension, dizziness, epigastric pain EXAM: PORTABLE CHEST 1 VIEW COMPARISON:  07/21/2021 FINDINGS: 2  frontal views of the chest demonstrate a stable cardiac silhouette. Stable atherosclerosis and ectasia of the thoracic aorta. No airspace disease, effusion, or pneumothorax. No acute bony abnormalities. IMPRESSION: 1. Stable chest, no acute process. Electronically Signed   By: Randa Ngo M.D.   On: 09/15/2021 18:20    EKG: I have personally reviewed EKG: shows NSR   Assessment/Plan Principal Problem:   Acute on chronic intracranial subdural hematoma (HCC) Active Problems:   AKI (acute kidney injury) (HCC)   Chronic subdural hematoma (HCC)   Alcohol abuse   Essential hypertension   DM type 2 (diabetes mellitus, type 2) (HCC)   Peripheral arterial disease (HCC)   CKD (chronic kidney disease) stage 3, GFR 30-59 ml/min (HCC) - baseline SCr 2.0    Acute on chronic intracranial subdural hematoma (HCC) Admit to medical bed. EDP discussed case with neurosurgery([facility]) who recommended repeat CT head in AM to assess stability of increased size of subdural hematoma.  AKI (acute kidney injury) ([place]) Pt with increase in Scr. Has received IVF in ER. Repeat CMP in AM. Hold nephrotoxic agents. No further episodes of hypotension. Only reported hypotension was during EMS transport to ER. No episodes of hypotension noted in ER.  Chronic subdural hematoma (HCC) Chronic. Avoid chemical DVT prophylaxis with xarelto, heparin or lovenox.  Alcohol abuse CIWA protocol.  Essential hypertension With reported hypotension prior to arrival to ER and with AKI  on CKD, will hold HTN meds for now.  DM type 2 (diabetes mellitus, type 2) (HCC) Check A1c. SSI.  Peripheral arterial disease (HCC) Stable.  CKD (chronic kidney disease) stage 3, GFR 30-59 ml/min (HCC) - baseline SCr 2.0 Acutely exacerbated. Holding nephrotoxic agents. Received IVF in ER. Repeat CMP in AM.  DVT prophylaxis: SCDs Code Status: Full Code Family Communication: no family at bedside  Disposition Plan: return home  Consults  called: neurosurgery([facility]) consulted by EDP  Admission status: Observation, Med-Surg   Kristopher Oppenheim, DO [facility] 09/15/2021, 9:27 PM

## 2021-09-15 NOTE — Assessment & Plan Note (Signed)
Chronic. Avoid chemical DVT prophylaxis with xarelto, heparin or lovenox.

## 2021-09-15 NOTE — Assessment & Plan Note (Signed)
Acutely exacerbated. Holding nephrotoxic agents. Received IVF in ER. Repeat CMP in AM.

## 2021-09-15 NOTE — ED Notes (Signed)
Pt provided food and drink. Tolerated well

## 2021-09-15 NOTE — ED Notes (Signed)
Upon entering the room, pt was sitting upright on the phone requesting for family to pick him up. Pt requested to leave AMA due to being hungry and claiming that staff had not fed him all day. Earlier afternoon, food was provided by staff after given the ok from Dr. Tyrone Nine. Admitting physician Dr. Bridgett Larsson notified of patient's decision to leave AMA. Pt was educated about the increase risk of death if he still proceeded with signing AMA forms. Pt was also informed that repeat CT's will occur in the morning to ensure that the subdural bleed was not progressing. Patient has verbally understood the urgency of this matter and the importance of remaining in the ED until an admission bed is assigned. Patient agreed to staying and given a [place] sandwich bag and sprite. He is now comfortable and in no distress. Dr. Bridgett Larsson aware that patient will stay for hospital admission.

## 2021-09-16 DIAGNOSIS — I6201 Nontraumatic acute subdural hemorrhage: Secondary | ICD-10-CM | POA: Diagnosis not present

## 2021-09-16 LAB — COMPREHENSIVE METABOLIC PANEL
ALT: 11 U/L (ref 0–44)
AST: 11 U/L — ABNORMAL LOW (ref 15–41)
Albumin: 3.1 g/dL — ABNORMAL LOW (ref 3.5–5.0)
Alkaline Phosphatase: 42 U/L (ref 38–126)
Anion gap: 7 (ref 5–15)
BUN: 30 mg/dL — ABNORMAL HIGH (ref 8–23)
CO2: 21 mmol/L — ABNORMAL LOW (ref 22–32)
Calcium: 8.8 mg/dL — ABNORMAL LOW (ref 8.9–10.3)
Chloride: 110 mmol/L (ref 98–111)
Creatinine, Ser: 1.82 mg/dL — ABNORMAL HIGH (ref 0.61–1.24)
GFR, Estimated: 37 mL/min — ABNORMAL LOW (ref >60)
Glucose, Bld: 85 mg/dL (ref 70–99)
Potassium: 3.9 mmol/L (ref 3.5–5.1)
Sodium: 138 mmol/L (ref 135–145)
Total Bilirubin: 0.2 mg/dL — ABNORMAL LOW (ref 0.3–1.2)
Total Protein: 5.7 g/dL — ABNORMAL LOW (ref 6.5–8.1)

## 2021-09-16 LAB — URINALYSIS, ROUTINE W REFLEX MICROSCOPIC
Bacteria, UA: NONE SEEN
Bilirubin Urine: NEGATIVE
Glucose, UA: NEGATIVE mg/dL
Hgb urine dipstick: NEGATIVE
Ketones, ur: NEGATIVE mg/dL
Nitrite: NEGATIVE
Protein, ur: NEGATIVE mg/dL
Specific Gravity, Urine: 1.011 (ref 1.005–1.030)
pH: 5 (ref 5.0–8.0)

## 2021-09-16 LAB — MAGNESIUM: Magnesium: 2.5 mg/dL — ABNORMAL HIGH (ref 1.7–2.4)

## 2021-09-16 LAB — CBC WITH DIFFERENTIAL/PLATELET
Abs Immature Granulocytes: 0.02 10*3/uL (ref 0.00–0.07)
Basophils Absolute: 0.1 10*3/uL (ref 0.0–0.1)
Basophils Relative: 1 %
Eosinophils Absolute: 0.2 10*3/uL (ref 0.0–0.5)
Eosinophils Relative: 3 %
HCT: 23.5 % — ABNORMAL LOW (ref 39.0–52.0)
Hemoglobin: 7.2 g/dL — ABNORMAL LOW (ref 13.0–17.0)
Immature Granulocytes: 0 %
Lymphocytes Relative: 34 %
Lymphs Abs: 2.4 10*3/uL (ref 0.7–4.0)
MCH: 24.1 pg — ABNORMAL LOW (ref 26.0–34.0)
MCHC: 30.6 g/dL (ref 30.0–36.0)
MCV: 78.6 fL — ABNORMAL LOW (ref 80.0–100.0)
Monocytes Absolute: 0.6 10*3/uL (ref 0.1–1.0)
Monocytes Relative: 9 %
Neutro Abs: 3.8 10*3/uL (ref 1.7–7.7)
Neutrophils Relative %: 53 %
Platelets: 327 10*3/uL (ref 150–400)
RBC: 2.99 MIL/uL — ABNORMAL LOW (ref 4.22–5.81)
RDW: 21.2 % — ABNORMAL HIGH (ref 11.5–15.5)
WBC: 7 10*3/uL (ref 4.0–10.5)
nRBC: 0 % (ref 0.0–0.2)

## 2021-09-16 LAB — LACTIC ACID, PLASMA
Lactic Acid, Venous: 3.2 mmol/L (ref 0.5–1.9)
Lactic Acid, Venous: 1.9 mmol/L (ref 0.5–1.9)

## 2021-09-16 LAB — PATHOLOGIST SMEAR REVIEW

## 2021-09-16 MED ORDER — LACTATED RINGERS IV SOLN: INTRAVENOUS | Status: DC

## 2021-09-16 MED ORDER — GABAPENTIN 300 MG PO CAPS: 300.0000 mg | ORAL_CAPSULE | Freq: Every day | ORAL | Status: DC

## 2021-09-16 MED ORDER — LORAZEPAM 2 MG/ML IJ SOLN: 1.0000 mg | INTRAMUSCULAR | Status: DC | PRN

## 2021-09-16 MED ORDER — ADULT MULTIVITAMIN W/MINERALS CH: 1.0000 | ORAL_TABLET | Freq: Every day | ORAL | Status: DC

## 2021-09-16 MED ORDER — FOLIC ACID 1 MG PO TABS: 1.0000 mg | ORAL_TABLET | Freq: Every day | ORAL | Status: DC

## 2021-09-16 MED ORDER — BRIMONIDINE TARTRATE 0.2 % OP SOLN
1.0000 [drp] | Freq: Three times a day (TID) | OPHTHALMIC | Status: DC
  Filled 2021-09-16: qty 5

## 2021-09-16 MED ORDER — ONDANSETRON HCL 4 MG/2ML IJ SOLN: 4.0000 mg | Freq: Four times a day (QID) | INTRAMUSCULAR | Status: DC | PRN

## 2021-09-16 MED ORDER — ACETAMINOPHEN 325 MG PO TABS: 650.0000 mg | ORAL_TABLET | Freq: Four times a day (QID) | ORAL | Status: DC | PRN

## 2021-09-16 MED ORDER — THIAMINE HCL 100 MG PO TABS: 100.0000 mg | ORAL_TABLET | Freq: Every day | ORAL | Status: DC

## 2021-09-16 MED ORDER — THIAMINE HCL 100 MG/ML IJ SOLN: 100.0000 mg | Freq: Every day | INTRAMUSCULAR | Status: DC

## 2021-09-16 MED ORDER — LORAZEPAM 1 MG PO TABS: 1.0000 mg | ORAL_TABLET | ORAL | Status: DC | PRN

## 2021-09-16 MED ORDER — ACETAMINOPHEN 650 MG RE SUPP: 650.0000 mg | Freq: Four times a day (QID) | RECTAL | Status: DC | PRN

## 2021-09-16 MED ORDER — FERROUS SULFATE 325 (65 FE) MG PO TABS: 325.0000 mg | ORAL_TABLET | Freq: Every day | ORAL | Status: DC

## 2021-09-16 MED ORDER — BRINZOLAMIDE 1 % OP SUSP
1.0000 [drp] | Freq: Three times a day (TID) | OPHTHALMIC | Status: DC
  Filled 2021-09-16: qty 10

## 2021-09-16 MED ORDER — ONDANSETRON HCL 4 MG PO TABS: 4.0000 mg | ORAL_TABLET | Freq: Four times a day (QID) | ORAL | Status: DC | PRN

## 2021-09-16 MED ORDER — TAMSULOSIN HCL 0.4 MG PO CAPS: 0.4000 mg | ORAL_CAPSULE | Freq: Every day | ORAL | Status: DC

## 2021-09-16 MED ORDER — DULOXETINE HCL 60 MG PO CPEP
60.0000 mg | ORAL_CAPSULE | Freq: Every day | ORAL | Status: DC
  Filled 2021-09-16: qty 1

## 2021-09-16 MED ORDER — LATANOPROST 0.005 % OP SOLN: 1.0000 [drp] | Freq: Every day | OPHTHALMIC | Status: DC

## 2021-09-16 MED ORDER — MELATONIN 5 MG PO TABS: 5.0000 mg | ORAL_TABLET | Freq: Every evening | ORAL | Status: DC | PRN

## 2021-09-16 MED ORDER — PANTOPRAZOLE SODIUM 40 MG PO TBEC: 40.0000 mg | DELAYED_RELEASE_TABLET | Freq: Every day | ORAL | Status: DC

## 2021-09-16 NOTE — Discharge Summary (Signed)
Patient left AMA before he was seen.  Apparently very agitated overnight and wants to leave the whole night.  Some family member came and picked him up.  Admitted with concern of acute on chronic subdural hematoma with recent fall, even refused to repeat CT head in the morning per nursing staff.

## 2021-09-16 NOTE — ED Notes (Signed)
Pt refused to sign AMA form.

## 2021-09-16 NOTE — ED Notes (Signed)
Pt refused COVID swab and advised he is ready to go home. Provider notified.

## 2021-09-16 NOTE — Progress Notes (Signed)
.  Transition of Care [facility]) - Emergency Department Mini Assessment   Patient Details  Name: Russell [name] MRN: [id number] Date of Birth: 09-02-1941  [facility] of [facility]) CM/SW Contact:    Illene Regulus, LCSW Phone Number: 09/16/2021, 10:51 AM   Clinical Narrative: CSW attached substance abuse resource to pt AVS. CSW spoke with pt who stated he was not interested in any counseling resources. Pt stated his wife and daughter will be picking him up upon d/c .   ED Mini Assessment: What brought you to the Emergency Department? : dizziness and epigastric pain  Barriers to Discharge: No Barriers Identified             Patient Contact and Communications        ,                 Admission diagnosis:  Acute on chronic intracranial subdural hematoma ([place]) [I62.01, I62.03] Patient Active Problem List   Diagnosis Date Noted   Chronic subdural hematoma ([place]) 09/15/2021   Acute on chronic intracranial subdural hematoma ([place]) 09/15/2021   CKD (chronic kidney disease) stage 3, GFR 30-59 ml/min ([place]) - baseline SCr 2.0 09/15/2021   Cognitive communication deficit 07/09/2021   Intracranial bleed ([place]) 07/07/2021   Fall at home, initial encounter    Traumatic hemorrhage of cerebrum without loss of consciousness ([place])    Osteoarthritis 05/31/2018   Foot pain, left 05/11/2018   Chest pain 06/23/2017   AKI (acute kidney injury) ([place]) 06/23/2017   Surgery, elective    Peripheral neuropathy 01/27/2017   Cervical radiculopathy 01/27/2017   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   Peripheral arterial disease ([place]) 01/23/2016   DM type 2 (diabetes mellitus, type 2) ([place]) 02/22/2015   Anemia 02/22/2015   Neuroma digital nerve 09/28/2013   Neuroma, 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   SEIZURE, 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   BENIGN PROSTATIC HYPERTROPHY, WITH URINARY OBSTRUCTION 06/13/2010   ARM PAIN 06/03/2010   DYSPNEA ON EXERTION 05/15/2010   TOBACCO ABUSE 04/03/2010   COPD  04/03/2010   G E R D 04/03/2010   OTHER DYSPHAGIA 04/03/2010   DIABETES MELLITUS, TYPE II, CONTROLLED 01/14/2010   LUMBAR RADICULOPATHY, LEFT 05/16/2009   DYSPHAGIA UNSPECIFIED 05/16/2009   COUGH DUE TO ACE INHIBITORS 04/02/2009   DIZZINESS 07/27/2008   HYPERCHOLESTEROLEMIA, MIXED 05/11/2008   CATARACTS, BILATERAL 03/01/2008   MILD SPINAL STENOSIS, CERVICAL 01/12/2008   LEG CRAMPS 01/12/2008   EARLY SATIETY 11/04/2007   DIVERTICULOSIS, COLON 08/31/2007   SCABIES 08/26/2007   ERECTILE DYSFUNCTION 08/18/2007   Alcohol abuse 08/18/2007   Essential hypertension 08/17/2007   HEMOCCULT POSITIVE STOOL 07/05/2007   ANEMIA, IRON DEFICIENCY NOS 05/31/2007   WEIGHT LOSS, ABNORMAL 05/10/2006   PCP:  Marcie Mowers, FNP Pharmacy:   [facility] [address] [place] [address] Phone: [phone] Fax: [phone]  [id number] - [place], [place] - [address] [place] [address] Phone: [phone] Fax: [phone]

## 2021-09-16 NOTE — ED Notes (Signed)
Attempted to wake pt for neuro check at which time he began yelling at RN that "you won't just let me sleep."  Pt refused to participate however he was alert and oriented and appeared to be able to move all extremities as he rolled over and pulled the blanket over his head.

## 2021-09-16 NOTE — ED Notes (Signed)
Provider aware of lactic, has placed orders

## 2021-09-16 NOTE — Progress Notes (Signed)
Repeat lactic acid 3.2. will start IV and repeat lactic acid at 5 AM.

## 2021-09-16 NOTE — ED Notes (Signed)
Pt refused CT scan

## 2021-09-16 NOTE — ED Notes (Signed)
Pt stating he wants to leave and pulling his pulse ox off and all his leads refusing to wear them. Pt called his sister to come and pick him up but could not get a hold of her.

## 2021-09-16 NOTE — ED Notes (Addendum)
Pt was not happy RN woke him up.  After some de-esclation Pt agreed to allow RN to start fluids but flatly refused the COVID test.  "You are not going to come in here and wake me up to shove that stick up my nose."  He would also not allow for temperature to be taken.

## 2021-09-17 ENCOUNTER — Other Ambulatory Visit: Payer: Self-pay | Admitting: Podiatry

## 2021-09-17 ENCOUNTER — Telehealth: Payer: Self-pay | Admitting: Podiatry

## 2021-09-17 MED ORDER — OXYCODONE-ACETAMINOPHEN 10-325 MG PO TABS: 1.0000 | ORAL_TABLET | Freq: Three times a day (TID) | ORAL | 0 refills | Status: AC | PRN

## 2021-09-17 NOTE — Telephone Encounter (Signed)
Patient called requesting pain medication.     Please advise.Marland Kitchen

## 2021-09-18 ENCOUNTER — Ambulatory Visit: Payer: Medicare Other | Admitting: Podiatry

## 2021-10-21 ENCOUNTER — Other Ambulatory Visit: Payer: Self-pay

## 2021-10-21 ENCOUNTER — Ambulatory Visit: Payer: Medicare Other | Admitting: Podiatry

## 2021-11-03 ENCOUNTER — Telehealth: Payer: Self-pay | Admitting: Podiatry

## 2021-11-03 NOTE — Telephone Encounter (Signed)
Patient is requesting pain meds. Patient would also like a call from Dr. Milinda Pointer.  Please advise

## 2021-11-04 ENCOUNTER — Ambulatory Visit (INDEPENDENT_AMBULATORY_CARE_PROVIDER_SITE_OTHER): Payer: Medicare Other | Admitting: Podiatry

## 2021-11-04 ENCOUNTER — Other Ambulatory Visit: Payer: Self-pay | Admitting: Podiatry

## 2021-11-04 ENCOUNTER — Other Ambulatory Visit: Payer: Self-pay

## 2021-11-04 DIAGNOSIS — B351 Tinea unguium: Secondary | ICD-10-CM

## 2021-11-04 DIAGNOSIS — M79676 Pain in unspecified toe(s): Secondary | ICD-10-CM | POA: Diagnosis not present

## 2021-11-04 DIAGNOSIS — G5782 Other specified mononeuropathies of left lower limb: Secondary | ICD-10-CM

## 2021-11-04 MED ORDER — OXYCODONE-ACETAMINOPHEN 10-325 MG PO TABS: 1.0000 | ORAL_TABLET | Freq: Three times a day (TID) | ORAL | 0 refills | Status: AC | PRN

## 2021-11-04 NOTE — Progress Notes (Signed)
He presents today chief complaint of painful elongated toenails and painful nerve in his left foot.  States that he saw his vascular doctor and the vascular surgeon that said that he would not be able to heal if he would have any kind of surgery.  Objective: Vital signs are stable he is alert oriented x3.  Nonpalpable.  There is no erythema edema cellulitis drainage or odor palpable Mulder's click third interspace of the left foot chronic in nature.  No open lesions or wounds.  Assessment: Peripheral vascular disease left.  Neuroma neuritis neuropathy third interdigital space left foot.  Pain limb secondary to onychomycosis.  Plan: Debridement of toenails 1 through 5 bilateral.  Also injected another dose of dehydrated alcohol third interspace left foot refilled his 21 tablets of Percocet.

## 2021-11-19 DIAGNOSIS — Z7409 Other reduced mobility: Secondary | ICD-10-CM | POA: Insufficient documentation

## 2021-11-19 DIAGNOSIS — R296 Repeated falls: Secondary | ICD-10-CM | POA: Insufficient documentation

## 2021-11-27 ENCOUNTER — Telehealth: Payer: Self-pay | Admitting: Podiatry

## 2021-11-27 ENCOUNTER — Ambulatory Visit: Payer: Medicare Other | Admitting: Podiatry

## 2021-11-27 ENCOUNTER — Other Ambulatory Visit: Payer: Self-pay | Admitting: Podiatry

## 2021-11-27 MED ORDER — OXYCODONE-ACETAMINOPHEN 10-325 MG PO TABS: 1.0000 | ORAL_TABLET | Freq: Three times a day (TID) | ORAL | 0 refills | Status: AC | PRN

## 2021-11-27 NOTE — Telephone Encounter (Signed)
Pt. Would like a refill on pain med. Sent to [facility] on [address]

## 2021-11-27 NOTE — Telephone Encounter (Signed)
Tried calling pt to let him know Dr. Milinda Pointer had sent his Rx refill to the pharmacy but was unable to leave a v/m due to v/m being full.

## 2021-11-27 NOTE — Telephone Encounter (Signed)
Patient is requesting a refill on Rx.

## 2021-12-23 ENCOUNTER — Ambulatory Visit (INDEPENDENT_AMBULATORY_CARE_PROVIDER_SITE_OTHER): Payer: Commercial Managed Care - HMO | Admitting: Podiatry

## 2021-12-23 ENCOUNTER — Other Ambulatory Visit: Payer: Self-pay

## 2021-12-23 ENCOUNTER — Encounter: Payer: Self-pay | Admitting: Podiatry

## 2021-12-23 DIAGNOSIS — G5782 Other specified mononeuropathies of left lower limb: Secondary | ICD-10-CM

## 2021-12-23 MED ORDER — OXYCODONE-ACETAMINOPHEN 10-325 MG PO TABS: 1.0000 | ORAL_TABLET | Freq: Three times a day (TID) | ORAL | 0 refills | Status: DC | PRN

## 2021-12-23 NOTE — Progress Notes (Signed)
He presents today states that his left foot is still hurting him.  Objective: Vital signs are stable alert oriented x3.  Pulses remain nonpalpable.  Toes are cool to the touch.  Previous vascular evaluation demonstrates peripheral vascular disease.  He has pain on palpation of the base of the third metatarsal phalangeal joint and third interdigital space.  Previous trauma to this area when he dropped a transmission on and has left the base of the third hypertrophic and mostly impinging the tissues in this area.  I do believe that he is arthritic in this area as well unfortunately there is nothing that we can do because of the vascular status and the previous neurectomy.  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  Plan: I injected the area today dehydrated alcohol to give him some relief from the stump neuroma which she does states that that gives him symptomatic relief for several days.  Also refilled his Percocet for 7 days.

## 2021-12-25 ENCOUNTER — Encounter: Payer: Self-pay | Admitting: Podiatry

## 2022-01-27 ENCOUNTER — Telehealth: Payer: Self-pay

## 2022-01-27 ENCOUNTER — Ambulatory Visit: Payer: Medicare Other | Admitting: Podiatry

## 2022-01-27 NOTE — Telephone Encounter (Signed)
Patient called the office wanting a refill of pain medication.   Please advise.Marland Kitchen

## 2022-01-28 ENCOUNTER — Other Ambulatory Visit: Payer: Self-pay | Admitting: Podiatry

## 2022-01-28 ENCOUNTER — Telehealth: Payer: Self-pay

## 2022-01-28 MED ORDER — OXYCODONE-ACETAMINOPHEN 10-325 MG PO TABS: 1.0000 | ORAL_TABLET | Freq: Three times a day (TID) | ORAL | 0 refills | Status: DC | PRN

## 2022-01-28 NOTE — Telephone Encounter (Signed)
Patient called the office wanting a refill of pain medication.     Please advise.Marland Kitchen

## 2022-01-28 NOTE — Telephone Encounter (Signed)
Refill sent. - Dr. Dreshaun Stene

## 2022-01-28 NOTE — Telephone Encounter (Signed)
Refill sent. - Dr. Smt. Loder

## 2022-01-29 NOTE — Telephone Encounter (Signed)
I called the patient to let him know that his RX has been sent , no voicemail box is set up.

## 2022-02-01 ENCOUNTER — Inpatient Hospital Stay (HOSPITAL_COMMUNITY)
Admission: EM | Admit: 2022-02-01 | Discharge: 2022-02-03 | DRG: 312 | Disposition: A | Discharge status: Home Health | Payer: Medicare Other | Attending: Family Medicine | Admitting: Family Medicine

## 2022-02-01 ENCOUNTER — Emergency Department (HOSPITAL_COMMUNITY): Payer: Medicare Other

## 2022-02-01 ENCOUNTER — Other Ambulatory Visit: Payer: Self-pay

## 2022-02-01 ENCOUNTER — Encounter (HOSPITAL_COMMUNITY): Payer: Self-pay

## 2022-02-01 DIAGNOSIS — E1142 Type 2 diabetes mellitus with diabetic polyneuropathy: Secondary | ICD-10-CM | POA: Diagnosis present

## 2022-02-01 DIAGNOSIS — F1010 Alcohol abuse, uncomplicated: Secondary | ICD-10-CM | POA: Diagnosis present

## 2022-02-01 DIAGNOSIS — I2510 Atherosclerotic heart disease of native coronary artery without angina pectoris: Secondary | ICD-10-CM | POA: Diagnosis present

## 2022-02-01 DIAGNOSIS — J449 Chronic obstructive pulmonary disease, unspecified: Secondary | ICD-10-CM | POA: Diagnosis present

## 2022-02-01 DIAGNOSIS — R55 Syncope and collapse: Secondary | ICD-10-CM

## 2022-02-01 DIAGNOSIS — N401 Enlarged prostate with lower urinary tract symptoms: Secondary | ICD-10-CM | POA: Diagnosis present

## 2022-02-01 DIAGNOSIS — N1832 Chronic kidney disease, stage 3b: Secondary | ICD-10-CM | POA: Diagnosis present

## 2022-02-01 DIAGNOSIS — I951 Orthostatic hypotension: Principal | ICD-10-CM | POA: Diagnosis present

## 2022-02-01 DIAGNOSIS — Z20822 Contact with and (suspected) exposure to covid-19: Secondary | ICD-10-CM | POA: Diagnosis present

## 2022-02-01 DIAGNOSIS — G5762 Lesion of plantar nerve, left lower limb: Secondary | ICD-10-CM | POA: Diagnosis present

## 2022-02-01 DIAGNOSIS — D75839 Thrombocytosis, unspecified: Secondary | ICD-10-CM | POA: Diagnosis present

## 2022-02-01 DIAGNOSIS — N179 Acute kidney failure, unspecified: Secondary | ICD-10-CM

## 2022-02-01 DIAGNOSIS — E1151 Type 2 diabetes mellitus with diabetic peripheral angiopathy without gangrene: Secondary | ICD-10-CM | POA: Diagnosis present

## 2022-02-01 DIAGNOSIS — D649 Anemia, unspecified: Secondary | ICD-10-CM | POA: Diagnosis not present

## 2022-02-01 DIAGNOSIS — Z7902 Long term (current) use of antithrombotics/antiplatelets: Secondary | ICD-10-CM | POA: Diagnosis not present

## 2022-02-01 DIAGNOSIS — D509 Iron deficiency anemia, unspecified: Secondary | ICD-10-CM | POA: Diagnosis present

## 2022-02-01 DIAGNOSIS — I129 Hypertensive chronic kidney disease with stage 1 through stage 4 chronic kidney disease, or unspecified chronic kidney disease: Secondary | ICD-10-CM | POA: Diagnosis present

## 2022-02-01 DIAGNOSIS — Z8249 Family history of ischemic heart disease and other diseases of the circulatory system: Secondary | ICD-10-CM | POA: Diagnosis not present

## 2022-02-01 DIAGNOSIS — E1122 Type 2 diabetes mellitus with diabetic chronic kidney disease: Secondary | ICD-10-CM | POA: Diagnosis present

## 2022-02-01 DIAGNOSIS — K219 Gastro-esophageal reflux disease without esophagitis: Secondary | ICD-10-CM | POA: Diagnosis present

## 2022-02-01 DIAGNOSIS — H409 Unspecified glaucoma: Secondary | ICD-10-CM | POA: Diagnosis present

## 2022-02-01 DIAGNOSIS — Z79899 Other long term (current) drug therapy: Secondary | ICD-10-CM | POA: Diagnosis not present

## 2022-02-01 DIAGNOSIS — F17210 Nicotine dependence, cigarettes, uncomplicated: Secondary | ICD-10-CM | POA: Diagnosis present

## 2022-02-01 DIAGNOSIS — Z888 Allergy status to other drugs, medicaments and biological substances status: Secondary | ICD-10-CM

## 2022-02-01 DIAGNOSIS — Z981 Arthrodesis status: Secondary | ICD-10-CM | POA: Diagnosis not present

## 2022-02-01 DIAGNOSIS — E876 Hypokalemia: Secondary | ICD-10-CM | POA: Diagnosis present

## 2022-02-01 DIAGNOSIS — N3281 Overactive bladder: Secondary | ICD-10-CM | POA: Diagnosis present

## 2022-02-01 DIAGNOSIS — E7800 Pure hypercholesterolemia, unspecified: Secondary | ICD-10-CM | POA: Diagnosis present

## 2022-02-01 DIAGNOSIS — E860 Dehydration: Secondary | ICD-10-CM

## 2022-02-01 LAB — COMPREHENSIVE METABOLIC PANEL
ALT: 15 U/L (ref 0–44)
AST: 13 U/L — ABNORMAL LOW (ref 15–41)
Albumin: 4.7 g/dL (ref 3.5–5.0)
Alkaline Phosphatase: 54 U/L (ref 38–126)
Anion gap: 15 (ref 5–15)
BUN: 55 mg/dL — ABNORMAL HIGH (ref 8–23)
CO2: 22 mmol/L (ref 22–32)
Calcium: 10.2 mg/dL (ref 8.9–10.3)
Chloride: 98 mmol/L (ref 98–111)
Creatinine, Ser: 2.32 mg/dL — ABNORMAL HIGH (ref 0.61–1.24)
GFR, Estimated: 28 mL/min — ABNORMAL LOW (ref >60)
Glucose, Bld: 119 mg/dL — ABNORMAL HIGH (ref 70–99)
Potassium: 3.7 mmol/L (ref 3.5–5.1)
Sodium: 135 mmol/L (ref 135–145)
Total Bilirubin: 0.5 mg/dL (ref 0.3–1.2)
Total Protein: 8.3 g/dL — ABNORMAL HIGH (ref 6.5–8.1)

## 2022-02-01 LAB — CBC
HCT: 26.1 % — ABNORMAL LOW (ref 39.0–52.0)
Hemoglobin: 7.8 g/dL — ABNORMAL LOW (ref 13.0–17.0)
MCH: 20.9 pg — ABNORMAL LOW (ref 26.0–34.0)
MCHC: 29.9 g/dL — ABNORMAL LOW (ref 30.0–36.0)
MCV: 69.8 fL — ABNORMAL LOW (ref 80.0–100.0)
Platelets: 619 10*3/uL — ABNORMAL HIGH (ref 150–400)
RBC: 3.74 MIL/uL — ABNORMAL LOW (ref 4.22–5.81)
RDW: 19.6 % — ABNORMAL HIGH (ref 11.5–15.5)
WBC: 9.1 10*3/uL (ref 4.0–10.5)
nRBC: 0 % (ref 0.0–0.2)

## 2022-02-01 LAB — URINALYSIS, ROUTINE W REFLEX MICROSCOPIC
Bacteria, UA: NONE SEEN
Bilirubin Urine: NEGATIVE
Glucose, UA: NEGATIVE mg/dL
Hgb urine dipstick: NEGATIVE
Ketones, ur: NEGATIVE mg/dL
Nitrite: NEGATIVE
Protein, ur: NEGATIVE mg/dL
Specific Gravity, Urine: 1.014 (ref 1.005–1.030)
pH: 5 (ref 5.0–8.0)

## 2022-02-01 LAB — PREPARE RBC (CROSSMATCH)

## 2022-02-01 LAB — VITAMIN B12: Vitamin B-12: 1594 pg/mL — ABNORMAL HIGH (ref 180–914)

## 2022-02-01 LAB — IRON AND TIBC
Iron: 17 ug/dL — ABNORMAL LOW (ref 45–182)
Saturation Ratios: 3 % — ABNORMAL LOW (ref 17.9–39.5)
TIBC: 542 ug/dL — ABNORMAL HIGH (ref 250–450)
UIBC: 525 ug/dL

## 2022-02-01 LAB — I-STAT CHEM 8, ED
BUN: 56 mg/dL — ABNORMAL HIGH (ref 8–23)
Calcium, Ion: 1.12 mmol/L — ABNORMAL LOW (ref 1.15–1.40)
Chloride: 101 mmol/L (ref 98–111)
Creatinine, Ser: 2.5 mg/dL — ABNORMAL HIGH (ref 0.61–1.24)
Glucose, Bld: 122 mg/dL — ABNORMAL HIGH (ref 70–99)
HCT: 28 % — ABNORMAL LOW (ref 39.0–52.0)
Hemoglobin: 9.5 g/dL — ABNORMAL LOW (ref 13.0–17.0)
Potassium: 3.7 mmol/L (ref 3.5–5.1)
Sodium: 133 mmol/L — ABNORMAL LOW (ref 135–145)
TCO2: 22 mmol/L (ref 22–32)

## 2022-02-01 LAB — DIFFERENTIAL
Abs Immature Granulocytes: 0.03 10*3/uL (ref 0.00–0.07)
Basophils Absolute: 0.1 10*3/uL (ref 0.0–0.1)
Basophils Relative: 2 %
Eosinophils Absolute: 0.1 10*3/uL (ref 0.0–0.5)
Eosinophils Relative: 1 %
Immature Granulocytes: 0 %
Lymphocytes Relative: 17 %
Lymphs Abs: 1.6 10*3/uL (ref 0.7–4.0)
Monocytes Absolute: 0.7 10*3/uL (ref 0.1–1.0)
Monocytes Relative: 7 %
Neutro Abs: 6.6 10*3/uL (ref 1.7–7.7)
Neutrophils Relative %: 73 %

## 2022-02-01 LAB — PROTIME-INR
INR: 0.9 (ref 0.8–1.2)
Prothrombin Time: 12.6 seconds (ref 11.4–15.2)

## 2022-02-01 LAB — RETICULOCYTES
Immature Retic Fract: 33.5 % — ABNORMAL HIGH (ref 2.3–15.9)
RBC.: 3.44 MIL/uL — ABNORMAL LOW (ref 4.22–5.81)
Retic Count, Absolute: 60.9 10*3/uL (ref 19.0–186.0)
Retic Ct Pct: 1.8 % (ref 0.4–3.1)

## 2022-02-01 LAB — RAPID URINE DRUG SCREEN, HOSP PERFORMED
Amphetamines: NOT DETECTED
Barbiturates: NOT DETECTED
Benzodiazepines: NOT DETECTED
Cocaine: NOT DETECTED
Opiates: NOT DETECTED
Tetrahydrocannabinol: NOT DETECTED

## 2022-02-01 LAB — MRSA NEXT GEN BY PCR, NASAL: MRSA by PCR Next Gen: NOT DETECTED

## 2022-02-01 LAB — APTT: aPTT: 25 seconds (ref 24–36)

## 2022-02-01 LAB — RESP PANEL BY RT-PCR (FLU A&B, COVID) ARPGX2
Influenza A by PCR: NEGATIVE
Influenza B by PCR: NEGATIVE
SARS Coronavirus 2 by RT PCR: NEGATIVE

## 2022-02-01 LAB — FOLATE: Folate: 87.6 ng/mL (ref >5.9)

## 2022-02-01 LAB — FERRITIN: Ferritin: 5 ng/mL — ABNORMAL LOW (ref 24–336)

## 2022-02-01 LAB — POC OCCULT BLOOD, ED: Fecal Occult Bld: NEGATIVE

## 2022-02-01 MED ORDER — SODIUM CHLORIDE 0.9% IV SOLUTION: Freq: Once | INTRAVENOUS | Status: DC

## 2022-02-01 MED ORDER — BRINZOLAMIDE 1 % OP SUSP
1.0000 [drp] | Freq: Three times a day (TID) | OPHTHALMIC | Status: DC
  Administered 2022-02-02 – 2022-02-03 (×4): 1 [drp] via OPHTHALMIC
  Filled 2022-02-01: qty 10

## 2022-02-01 MED ORDER — SODIUM CHLORIDE 0.9 % IV BOLUS
500.0000 mL | Freq: Once | INTRAVENOUS | Status: AC
  Administered 2022-02-01: 500 mL via INTRAVENOUS

## 2022-02-01 MED ORDER — SODIUM CHLORIDE 0.9 % IV SOLN: 100.0000 mL/h | INTRAVENOUS | Status: DC

## 2022-02-01 MED ORDER — MECLIZINE HCL 25 MG PO TABS: 25.0000 mg | ORAL_TABLET | Freq: Every day | ORAL | Status: DC | PRN

## 2022-02-01 MED ORDER — BRIMONIDINE TARTRATE 0.2 % OP SOLN
1.0000 [drp] | Freq: Three times a day (TID) | OPHTHALMIC | Status: DC
  Administered 2022-02-02 – 2022-02-03 (×4): 1 [drp] via OPHTHALMIC
  Filled 2022-02-01: qty 5

## 2022-02-01 MED ORDER — PANTOPRAZOLE SODIUM 40 MG PO TBEC
40.0000 mg | DELAYED_RELEASE_TABLET | Freq: Every day | ORAL | Status: DC
  Administered 2022-02-02 – 2022-02-03 (×2): 40 mg via ORAL
  Filled 2022-02-01 (×2): qty 1

## 2022-02-01 MED ORDER — BRIMONIDINE TARTRATE 0.2 % OP SOLN: 1.0000 [drp] | Freq: Three times a day (TID) | OPHTHALMIC | Status: DC

## 2022-02-01 MED ORDER — GABAPENTIN 300 MG PO CAPS
300.0000 mg | ORAL_CAPSULE | Freq: Every day | ORAL | Status: DC
  Administered 2022-02-02 – 2022-02-03 (×2): 300 mg via ORAL
  Filled 2022-02-01 (×2): qty 1

## 2022-02-01 MED ORDER — LATANOPROST 0.005 % OP SOLN
1.0000 [drp] | Freq: Every day | OPHTHALMIC | Status: DC
  Administered 2022-02-02: 1 [drp] via OPHTHALMIC
  Filled 2022-02-01: qty 2.5

## 2022-02-01 MED ORDER — PRAVASTATIN SODIUM 40 MG PO TABS
80.0000 mg | ORAL_TABLET | Freq: Every day | ORAL | Status: DC
  Administered 2022-02-01 – 2022-02-03 (×3): 80 mg via ORAL
  Filled 2022-02-01 (×3): qty 2

## 2022-02-01 MED ORDER — MIRABEGRON ER 25 MG PO TB24
25.0000 mg | ORAL_TABLET | Freq: Every day | ORAL | Status: DC
  Administered 2022-02-02 – 2022-02-03 (×2): 25 mg via ORAL
  Filled 2022-02-01 (×3): qty 1

## 2022-02-01 MED ORDER — SODIUM CHLORIDE 0.9 % IV SOLN
100.0000 mL/h | INTRAVENOUS | Status: AC
  Administered 2022-02-01 – 2022-02-02 (×2): 100 mL/h via INTRAVENOUS

## 2022-02-01 NOTE — ED Notes (Signed)
Admitting at bedside. Pt given crackers and drink per EDP.

## 2022-02-01 NOTE — ED Notes (Signed)
Patient transported to CT 

## 2022-02-01 NOTE — ED Triage Notes (Signed)
Pt BIB [facility] from home c/o a syncopal episode. Pt has not been feeling well for 3 weeks and really did not want to come. Per EMS pt is orthostatic positive. Pt went from 726 systolic to 90 systolic from sitting to standing position.

## 2022-02-01 NOTE — ED Provider Notes (Signed)
[facility] EMERGENCY DEPARTMENT Provider Note   CSN: [id number] Arrival date & time: 02/01/22  1204     History  Chief Complaint  Patient presents with   Loss of Consciousness    Russell [name] is a 81 y.o. male.   Loss of Consciousness   Pt states he has been having dizzy spells this whole month.  They come and go.  It mostly occurs when he stands up.  Today he had an episode where he fell after the dizzy spell.  He was getting out of the bed.  NO loc.  No CP or shortness of breath.  Some stomache  the last couple of days ache but no vomiting or diarrhea.  No blood in the stool.  Appetite has not been good the last few days.  No appetite.  Home Medications Prior to Admission medications   Medication Sig Start Date End Date Taking? Authorizing Provider  acetaminophen (TYLENOL) 325 MG tablet Take 2 tablets (650 mg total) by mouth every 4 (four) hours as needed for mild pain (or temp > 37.5 C (99.5 F)). Patient not taking: Reported on 09/15/2021 07/10/21   Samuella Cota, MD  amitriptyline (ELAVIL) 25 MG tablet Take 10 mg by mouth daily. 07/22/20   [provider]  AZOPT 1 % ophthalmic suspension Place 1 drop into both eyes 3 (three) times daily. 12/25/16   [provider]  bimatoprost (LUMIGAN) 0.01 % SOLN Place 1 drop into both eyes at bedtime. 04/10/13   Dhungel, Nishant, MD  brimonidine (ALPHAGAN) 0.2 % ophthalmic solution Place 1 drop into both eyes 2 (two) times daily. 03/14/19   [provider]  Brinzolamide-Brimonidine (SIMBRINZA) 1-0.2 % SUSP Place 1 drop into both eyes 3 (three) times daily.    [provider]  clopidogrel (PLAVIX) 75 MG tablet Take 75 mg by mouth daily. 09/05/21   [provider]  DULoxetine (CYMBALTA) 60 MG capsule Take 60 mg by mouth daily.    [provider]  esomeprazole (NEXIUM) 40 MG capsule Take 1 capsule (40 mg total) by mouth daily. Patient taking differently: Take 40 mg by  mouth daily as needed (acid reflux/indigestion.). 07/16/17   Isaiah Serge, NP  ferrous sulfate 325 (65 FE) MG EC tablet Take 325 mg by mouth daily.    [provider]  finasteride (PROSCAR) 5 MG tablet Take 5 mg by mouth daily. 09/10/21   [provider]  gabapentin (NEURONTIN) 300 MG capsule Take 300 mg by mouth daily. 02/25/21   [provider]  hydrochlorothiazide (HYDRODIURIL) 12.5 MG tablet Take 12.5 mg by mouth daily. 05/17/21   [provider]  losartan (COZAAR) 100 MG tablet Take 100 mg by mouth daily. 09/08/21   [provider]  meclizine (ANTIVERT) 25 MG tablet Take 25 mg by mouth daily as needed for dizziness. 06/16/17   [provider]  MYRBETRIQ 25 MG TB24 tablet Take 25 mg by mouth daily. 02/12/20   [provider]  Omega-3 Fatty Acids (FISH OIL) 1000 MG CPDR Take 1,000 mg by mouth daily.    [provider]  oxybutynin (DITROPAN) 5 MG tablet Take 10 mg by mouth every morning. 11/13/19   [provider]  oxybutynin (DITROPAN-XL) 10 MG 24 hr tablet Take 10 mg by mouth daily. 11/19/21   [provider]  oxyCODONE-acetaminophen (PERCOCET) 10-325 MG tablet Take 1 tablet by mouth every 4 (four) hours as needed for pain.    [provider]  oxyCODONE-acetaminophen (PERCOCET) 10-325 MG tablet Take 1 tablet by mouth every 8 (eight) hours as needed for up to 7 days for pain. 01/28/22 02/04/22  Edrick Kins, DPM  pravastatin (PRAVACHOL) 80 MG tablet Take 80 mg by mouth daily.  10/19/16   [provider]  tamsulosin (FLOMAX) 0.4 MG CAPS capsule Take 0.4 mg by mouth daily. 12/03/16   [provider]      Allergies    Tramadol    Review of Systems   Review of Systems  Cardiovascular:  Positive for syncope.  Genitourinary:  Positive for frequency.   Physical Exam Updated Vital Signs BP 125/74    Pulse 65    Temp 98.2 F (36.8 C) (Oral)    Resp 16    Ht 1.88 m (6\' 2" )    Wt 74.8 kg     SpO2 99%    BMI 21.18 kg/m  Physical Exam Vitals and nursing note reviewed.  Constitutional:      General: He is not in acute distress.    Appearance: He is well-developed.  HENT:     Head: Normocephalic and atraumatic.     Right Ear: External ear normal.     Left Ear: External ear normal.  Eyes:     General: No scleral icterus.       Right eye: No discharge.        Left eye: No discharge.     Conjunctiva/sclera: Conjunctivae normal.  Neck:     Trachea: No tracheal deviation.  Cardiovascular:     Rate and Rhythm: Normal rate and regular rhythm.  Pulmonary:     Effort: Pulmonary effort is normal. No respiratory distress.     Breath sounds: Normal breath sounds. No stridor. No wheezing or rales.  Abdominal:     General: Bowel sounds are normal. There is no distension.     Palpations: Abdomen is soft.     Tenderness: There is no abdominal tenderness. There is no guarding or rebound.  Musculoskeletal:        General: No tenderness.     Cervical back: Neck supple.     Comments: Atrophy noted left hand muscles  Skin:    General: Skin is warm and dry.     Findings: No rash.  Neurological:     Mental Status: He is alert and oriented to person, place, and time.     Cranial Nerves: No cranial nerve deficit.     Sensory: No sensory deficit.     Motor: Atrophy present. No abnormal muscle tone or seizure activity.     Coordination: Coordination normal.     Comments: No pronator drift bilateral upper extrem, able to hold both legs off bed for 5 seconds, left hand grip strength weaker, sensation intact in all extremities, no visual field cuts, no left or right sided neglect, normal finger-nose exam bilaterally, no nystagmus noted  No facial droop, extraocular movements intact, tongue midline    ED Results / Procedures / Treatments   Labs (all labs ordered are listed, but only abnormal results are displayed) Labs Reviewed  CBC - Abnormal; Notable for the following components:       Result Value   RBC 3.74 (*)    Hemoglobin 7.8 (*)    HCT 26.1 (*)    MCV 69.8 (*)    MCH 20.9 (*)    MCHC 29.9 (*)    RDW 19.6 (*)    Platelets 619 (*)    All other components within  normal limits  COMPREHENSIVE METABOLIC PANEL - Abnormal; Notable for the following components:   Glucose, Bld 119 (*)    BUN 55 (*)    Creatinine, Ser 2.32 (*)    Total Protein 8.3 (*)    AST 13 (*)    GFR, Estimated 28 (*)    All other components within normal limits  I-STAT CHEM 8, ED - Abnormal; Notable for the following components:   Sodium 133 (*)    BUN 56 (*)    Creatinine, Ser 2.50 (*)    Glucose, Bld 122 (*)    Calcium, Ion 1.12 (*)    Hemoglobin 9.5 (*)    HCT 28.0 (*)    All other components within normal limits  RESP PANEL BY RT-PCR (FLU A&B, COVID) ARPGX2  PROTIME-INR  APTT  DIFFERENTIAL  RAPID URINE DRUG SCREEN, HOSP PERFORMED  URINALYSIS, ROUTINE W REFLEX MICROSCOPIC  VITAMIN B12  FOLATE  IRON AND TIBC  FERRITIN  RETICULOCYTES  POC OCCULT BLOOD, ED    EKG EKG Interpretation  Date/Time:  Sunday February 01 2022 12:14:25 EST Ventricular Rate:  64 PR Interval:  161 QRS Duration: 109 QT Interval:  441 QTC Calculation: 455 R Axis:   41 Text Interpretation: Sinus or ectopic atrial rhythm LVH with secondary repolarization abnormality No significant change since last tracing Confirmed by Dorie Rank [id number]) on 02/01/2022 12:17:51 PM  Radiology CT HEAD WO CONTRAST  Result Date: 02/01/2022 CLINICAL DATA:  Neuro deficit, acute, stroke suspected.  Syncope. EXAM: CT HEAD WITHOUT CONTRAST TECHNIQUE: Contiguous axial images were obtained from the base of the skull through the vertex without intravenous contrast. RADIATION DOSE REDUCTION: This exam was performed according to the departmental dose-optimization program which includes automated exposure control, adjustment of the mA and/or kV according to patient size and/or use of iterative reconstruction technique. COMPARISON:  Head  CT 09/15/2021 FINDINGS: Brain: There is no evidence of an acute infarct, intracranial hemorrhage, mass, midline shift, or extra-axial fluid collection. Subdural collections at the vertex on the prior CT have resolved. There is mild cerebral atrophy. Patchy hypodensities in the cerebral white matter bilaterally are unchanged and nonspecific but compatible with moderate chronic small vessel ischemic disease. Vascular: Calcified atherosclerosis at the skull base. No hyperdense vessel. Skull: Partial coverage of previously-seen left frontal skull, skull base, and maxillofacial fractures. Sinuses/Orbits: Bilateral cataract extraction. Visualized paranasal sinuses and mastoid air cells are clear. Other: None. IMPRESSION: 1. No evidence of acute intracranial abnormality. 2. Moderate chronic small vessel ischemic disease. Electronically Signed   By: Logan Bores M.D.   On: 02/01/2022 13:57   DG Chest Portable 1 View  Result Date: 02/01/2022 CLINICAL DATA:  Weakness. EXAM: PORTABLE CHEST 1 VIEW COMPARISON:  09/15/2021 FINDINGS: 1232 hours. The lungs are clear without focal pneumonia, edema, pneumothorax or pleural effusion. The cardiopericardial silhouette is within normal limits for size. The visualized bony structures of the thorax show no acute abnormality. Telemetry leads overlie the chest. IMPRESSION: No active disease. Electronically Signed   By: Misty Stanley M.D.   On: 02/01/2022 13:11    Procedures Procedures    Medications Ordered in ED Medications  sodium chloride 0.9 % bolus 500 mL (500 mLs Intravenous New Bag/Given 02/01/22 1341)    Followed by  0.9 %  sodium chloride infusion (has no administration in time range)    ED Course/ Medical Decision Making/ A&P Clinical Course as of 02/01/22 1510  Sun Feb 01, 2022  1419 Resp Panel by RT-PCR (Flu A&B, Covid) Nasopharyngeal  Swab COVID and flu test are negative [JK]  1419 CBC(!) Hemoglobin low at 7.8 but actually improve when compared to previous  values.  Hemoglobin was 17.2,4 months ago [JK]  1420 CT HEAD WO CONTRAST Head CT without acute findings [JK]  1420 DG Chest Portable 1 View Chest x-ray without acute findings [JK]  1445 Comprehensive metabolic panel(!) Metabolic panel shows elevation in BUN and creatinine suggestive of dehydration. [JK]  1500 Pt refusing condom cath or bladder scan [JK]  1506 Discussed with family medical service regarding admission [JK]    Clinical Course User Index [JK] Dorie Rank, MD                           Medical Decision Making Amount and/or Complexity of Data Reviewed Labs: ordered. Decision-making details documented in ED Course. Radiology: ordered. Decision-making details documented in ED Course.  Risk Prescription drug management. Decision regarding hospitalization.   Syncope Pt presents with recurrent lightheadedness, pre syncope.  Labs show AKI, dehydration.  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  Suspect decreased p.o. intake  Anemia Appears chronic, Hemoccult stool was performed.  Result is pending but there is no melena noted on exam and patient has denied rectal bleeding.  We will send off anemia panel  With his dehydration and syncope I will consult with the medical service for admission and further treatment        Final Clinical Impression(s) / ED Diagnoses Final diagnoses:  Syncope and collapse  AKI (acute kidney injury) ([place])  Dehydration  Anemia, unspecified type        Dorie Rank, MD 02/01/22 1510

## 2022-02-01 NOTE — H&P (Signed)
[facility] Admission History and Physical Service Pager: [phone]  Patient name: Russell [name] Medical record number: [id number] Date of birth: 08/21/1941 Age: 81 y.o. Gender: male  Primary Care Provider: Marcie Mowers, FNP Consultants: None Code Status: FULL Preferred Emergency Contact:  Contact Information     Name Relation Home Work Mobile   [place] Spouse [phone]  [phone]   Dorise Bullion Daughter   [phone]   Dick,Bobette Sister [phone]          Chief Complaint: Feels Lightheaded  Assessment and Plan: Russell [name] is a 81 y.o. male presenting with dizziness and following and fall. PMH is significant for history of subdural hematomas with frquent falls, alcohol use disorder, hypertension, PAD, COPD.  Pre-Syncope Likely 2/2 to Symptomatic Anemia Patient being admitted for work-up of syncope given that he had felt lightheaded this morning and hence the wall until he dropped to the floor.  Denies any loss of consciousness or head trauma.  Has had history of subdural and intracranial hemorrhages in the past.  CT head showed no acute intracranial abnormalities with moderate chronic small vessel ischemic disease and subdural collections that have resolved from prior CTs.  Chest x-ray was clear.  Vitals have remained stable and normal in the ED but did have positive orthostatic vitals of 250 systolic to 90 systolic from sitting to standing position.  EKG showed sinus rhythm with LVH.  Labs showed AKI, elevated BUN to 55, hemoglobin of 7.8.  Respiratory panel was negative and glucose were appropriate.  Last echocardiogram was on examination patient was alert and responsive to all questions.  On examination there are no focal neurologic deficits patient was alert and oriented.  Differentials include presyncope from symptomatic anemia given patient had hemoglobin of 7.8 on admission unlikely to be related to intracranial bleed  given head CT was negative but does have a history of gastritis.  Other differentials could be presyncope from decreased p.o. intake although patient says he drinks "half a gallon of water" a day but he says that he does feel dizzy when standing up.  Less likely to be related to vertigo given patient says that he feels more lightheaded and does not feel like the room is spinning around him.  Cardiac arrhythmias possibility however patient says he did not lose consciousness and denies any chest pain.  Can consider balance impairment as well as patient appears to be somewhat deconditioned.  Patient denies any alcohol or illicit drug use currently (short term rx's of percocet per PDMP) less likely to be related to overingestion. Does have home med of meclizine 25 mg prn -Admit to [place], attending Dr. Owens Shark, med telemetry -Vitals per floor protocol -Echocardiogram -Cardiac monitoring -Monitor symptoms -Keep oxygen saturations 88 to 92% -PT/OT -Home Meclizine hold for anticholenergic effects -1 unit PRBC ordered -NS IVF for 12 more hours -AM CBC, BMP  Symptomatic Anemia Hemoglobin of 7.8 on admission. (I-STAT with hemoglobin of 9.5 unlikely to be true level).  Denies any melanotic stools said his last fall was 2 days ago.  Denies any hemoptysis or bloody emesis.  Abdomen was soft and nontender to palpation on examination. Has had EGD in past which has showed gastritis. -Monitor on CBC -1 unit PRBC ordered -Follow-up post transfusion H&H -Consider GI consult if worsening -protonix 40 mg daily -Hemoglobin threshold of 8 given history of PAD  AKI on CKD 3b Creatinine on admission was 2.32 which increased to 2.5.  GFR was 28.  Baseline seems to  be around 1.2-1.9 last year.  Says he drinks half a gallon of water a day and has not decreased but does appear to have some dry mucous membranes on examination.  S/p 500 mL bolus NS. -IVF for 12 more hours -Monitor on BMP -Avoid nephrotoxic agents  History  of subdural hematomas 09/15/21 had small subdural collections on head CT.  07/21/2021 head CT showed hypoattenuating subdural hematomas over both cerebral convexities.  Head CT today showed no acute abnormalities.  There were no neurologic deficits on examination today and patient denied any kind of headaches.  Denies any LOC/head trauma recently -Monitor mental status -Repeat neuroimaging if patient develops neurologic deficits  Alcohol Use Disorder History of heavy alcohol use per chart review.  Denies any current alcohol use and says his last drink was 18 years ago. Last admission CIWA protocol was initiated on admission although likely not withdrawing currently. -CIWA q8h without ativan  -Monitor  HTN BP has been 117-136/55-80. Home medication of HCTZ 12.5 mg daily, losartan 100 mg daily.  -Hold home antihypertensives in the setting of AKI and normotensive BP  PAD Follows with vascular surgery Dr. Stanford Breed.  Last seen 09/02/2021 and has been doing well from a vascular standpoint at that point.  Has had aortogram with left lower extremity angiogram 02/26/2021 with left SFA drug-eluting stent.  Legs appear to be perfused on examination without pain. Home medication of Plavix 75 mg and pravastatin 80 mg. Does not seems to be taking aspirin daily.  -Continue pravastatin -Hold Plavix in setting of possible bleed  Neuroma Left Foot Receives dehydrated alcohol injections at podiatry. Did receive short course of Percocet recently. -Monitor -Restart percocet as appropriate -Podiatry outpatient -gabapentin   BPH Has home medications of tamsulosin 0.4 mg daily and myrbetriq 25 mg daily.  -Monitor UOP -Monitor bladder scans -continue myrbetriq -hold tamsulosin given orthostatic hypotension  GERD Home medication of nexium 40 mg daily.  -protonix 40 mg daily  HLD Pravastatin 80 mg daily. -continue home medication  Tobacco Use 1 ppd since 81 years old. Not interested in nicotine  patch. -Monitor -encourage cessation  Hx of YAG Capsulotomy Home medicaitons of Lumigan, Alphagan, Simbrinza. -continue home eye drops  FEN/GI: Heart healthy Prophylaxis: SCDs  Disposition: Med Tele  History of Present Illness:  Russell [name] is a 81 y.o. male presenting with dizziness/lightheadedness.  He had a fall this morning and his wife called EMS and denies any head injury or loss of consciousness. Says he felt lightheaded with his back to the wall fell down to the ground. Denies room spinning around or any chest pain during the episode. Denies dark stools, hematemesis, fever, headache, vision changes, chest pain, abdominal pain, constipation, diarrhea, dysuria, urinary frequency. Last BM in the past 1-2 days which was normal. He does report SOB at times not when exertion but just "whenever."  Tobacco- 1 ppd since the age of 73. Does not want nicotine patch. Alcohol- denies, last drink 18 years ago. Drug Use- denies.  Review Of Systems: Per HPI with the following additions:   Review of Systems  Constitutional:  Negative for fever.  Respiratory:  Positive for shortness of breath.   Cardiovascular:  Negative for chest pain and leg swelling.  Gastrointestinal:  Negative for abdominal pain, blood in stool, constipation and diarrhea.  Endocrine: Positive for cold intolerance.  Genitourinary:  Negative for difficulty urinating, dysuria and hematuria.  Neurological:  Positive for dizziness and light-headedness.    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   Acute on chronic intracranial subdural hematoma (HCC) 09/15/2021   CKD (chronic kidney disease) stage 3, GFR 30-59 ml/min (HCC) - baseline SCr 2.0 09/15/2021   Cognitive communication deficit 07/09/2021   Intracranial bleed ([place]) 07/07/2021   Fall at home, initial encounter    Traumatic hemorrhage of cerebrum without loss of consciousness ([place])    Osteoarthritis  05/31/2018   Foot pain, left 05/11/2018   Chest pain 06/23/2017   AKI (acute kidney injury) ([place]) 06/23/2017   Surgery, elective    Peripheral neuropathy 01/27/2017   Cervical radiculopathy 01/27/2017   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   Peripheral arterial disease ([place]) 01/23/2016   DM type 2 (diabetes mellitus, type 2) ([place]) 02/22/2015   Anemia 02/22/2015   Neuroma digital nerve 09/28/2013   Neuroma, 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   SEIZURE, 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   BENIGN PROSTATIC HYPERTROPHY, WITH URINARY OBSTRUCTION 06/13/2010   ARM PAIN 06/03/2010   DYSPNEA ON EXERTION 05/15/2010   TOBACCO ABUSE 04/03/2010   COPD 04/03/2010   G E R D 04/03/2010   OTHER DYSPHAGIA 04/03/2010   DIABETES MELLITUS, TYPE II, CONTROLLED 01/14/2010   LUMBAR RADICULOPATHY, LEFT 05/16/2009   DYSPHAGIA UNSPECIFIED 05/16/2009   COUGH DUE TO ACE INHIBITORS 04/02/2009   DIZZINESS 07/27/2008   HYPERCHOLESTEROLEMIA, MIXED 05/11/2008   CATARACTS, BILATERAL 03/01/2008   MILD SPINAL STENOSIS, CERVICAL 01/12/2008   LEG CRAMPS 01/12/2008   EARLY SATIETY 11/04/2007   DIVERTICULOSIS, COLON 08/31/2007   SCABIES 08/26/2007   ERECTILE DYSFUNCTION 08/18/2007   Alcohol abuse 08/18/2007   Essential hypertension 08/17/2007   HEMOCCULT POSITIVE STOOL 07/05/2007   ANEMIA, IRON DEFICIENCY NOS 05/31/2007   WEIGHT LOSS, ABNORMAL 05/10/2006    Past Medical History: Past Medical History:  Diagnosis Date   AKI (acute kidney injury) ([place]) 06/23/2017   Arthritis    SHOUDLERS, LUMBAR   BPH with obstruction/lower urinary tract symptoms    BPH with urinary obstruction 01/15/2016   COPD (chronic obstructive pulmonary disease) ([place])    Coronary atherosclerosis    denied knowledge of this 10/27/16   Depression    Diverticulosis of colon    Dyspnea    ED (erectile dysfunction)    Full dentures    GERD (gastroesophageal reflux disease)    not current   Glaucoma    History of gastritis    History of scabies    2008    History of seizure    2013-- x2   idiopathic --  none since   History of syncope    03-22-2014  DX  VAGAL RESPONSE   Hyperlipidemia, mixed    Hypertension    Mitral regurgitation    Peripheral arterial disease ([place])    one vessel runoff bilaterally via peroneal arteries   Prostate cancer ([place])    Seizures ([place]) 03/2014   ? or syncope- patient refused to be admitted for further studies- "felt better"   Type 2 diabetes, diet controlled ([place])    patient and his wife said no- have not been told 01/21/17   Wears glasses     Past Surgical History: Past Surgical History:  Procedure Laterality Date   ABDOMINAL AORTOGRAM W/LOWER EXTREMITY Left 12/18/2020   Procedure: ABDOMINAL AORTOGRAM W/LOWER EXTREMITY;  Surgeon: Cherre Robins, MD;  Location: [place] [facility];  Service: Cardiovascular;  Laterality: Left;   ABDOMINAL AORTOGRAM W/LOWER EXTREMITY N/A 02/26/2021   Procedure: ABDOMINAL AORTOGRAM W/LOWER EXTREMITY;  Surgeon: Cherre Robins, MD;  Location: [facility]  INVASIVE CV LAB;  Service: Cardiovascular;  Laterality: N/A;   ANTERIOR CERVICAL DECOMP/DISCECTOMY FUSION  10-16-2009   C4 -- C6   ANTERIOR CERVICAL DECOMP/DISCECTOMY FUSION N/A 01/27/2017   Procedure: ANTERIOR CERVICAL DECOMPRESSION FUSION CERVICAL 3-4 WITH INSTRUMENTATION AND ALLOGRAFT;  Surgeon: Phylliss Bob, MD;  Location: [place];  Service: Orthopedics;  Laterality: N/A;  ANTERIOR CERVICAL DECOMPRESSION FUSION CERVICAL 3-4 WITH INSTRUMENTATION AND ALLOGRAFT   CAPSULOTOMY Right 01/26/2013   Procedure: MINOR CAPSULOTOMY;  Surgeon: Myrtha Mantis., MD;  Location: [place];  Service: Ophthalmology;  Laterality: Right;   CARPAL TUNNEL RELEASE Left 01/02/2019   Procedure: LEFT CARPAL TUNNEL RELEASE;  Surgeon: Leanora Cover, MD;  Location: [place];  Service: Orthopedics;  Laterality: Left;   CARPECTOMY Right 03/13/2014   Procedure: RIGHT  PROXIMAL ROW CARPECTOMY;  Surgeon: Tennis Must, MD;  Location: [place];  Service: Orthopedics;  Laterality: Right;   CARPECTOMY Left 10/22/2015   Procedure: LEFT WRIST PROXIMAL ROW CARPECTOMY ;  Surgeon: Leanora Cover, MD;  Location: [place];  Service: Orthopedics;  Laterality: Left;   CARPOMETACARPAL ([place]) FUSION OF THUMB Right 03/13/2014   Procedure: RIGHT FUSION OF THUMB CARPOMETACARPAL [facility]) JOINT;  Surgeon: Tennis Must, MD;  Location: [place];  Service: Orthopedics;  Laterality: Right;   CARPOMETACARPEL SUSPENSION PLASTY Left 04/04/2020   Procedure: LEFT THUMB TRAPECIECTOMY AND SUSPENSIONPLASTY;  Surgeon: Leanora Cover, MD;  Location: [place];  Service: Orthopedics;  Laterality: Left;   CATARACT EXTRACTION W/ INTRAOCULAR LENS  IMPLANT, BILATERAL  2009   COLONOSCOPY  08-31-2007   COLONOSCOPY     CRYOABLATION N/A 01/14/2015   Procedure: CRYO ABLATION PROSTATE;  Surgeon: Ailene Rud, MD;  Location[facility];  Service: Urology;  Laterality: N/A;   ESOPHAGOGASTRODUODENOSCOPY  last one 09-11-2008   EXCISION MASS LEFT FACE , SUBORBITAL AREA, PLASTER RECONSTRUCTION  02-09-2011   EXCISIONAL DEBRIDEMENT AND REPAIR RIGHT QUADRICEP TENDON  07-05-2000   FOOT SURGERY Left    I & D EXTREMITY Right 05/24/2013   Procedure: RIGHT INDEX WOUND DEBRIDEMENT AND CLOSURE;  Surgeon: Jolyn Nap, MD;  Location: [place];  Service: Orthopedics;  Laterality: Right;   INSERTION OF SUPRAPUBIC CATHETER N/A 01/14/2015   Procedure: SUPRAPUBIC TUBE PLACEMENT;  Surgeon: Ailene Rud, MD;  Location[facility];  Service: Urology;  Laterality: N/A;   KNEE ARTHROSCOPY Right 2008   LARYNGOSCOPY AND ESOPHAGOSCOPY  06-13-2010   MARSUPIALIZATION LEFT LARGE VALLECULAR CYST   MASS EXCISION Left 10/22/2015   Procedure: EXCISION MASS OF LEFT INDEX FINGER;  Surgeon: Leanora Cover, MD;  Location: [place];  Service: Orthopedics;  Laterality: Left;   ORCHIECTOMY Left  02/24/2015   Procedure: SCROTAL EXPLORATION WITH LEFT ORCHIECTOMY;  Surgeon: Kathie Rhodes, MD;  Location: [place];  Service: Urology;  Laterality: Left;   PERIPHERAL VASCULAR INTERVENTION Left 02/26/2021   Procedure: PERIPHERAL VASCULAR INTERVENTION;  Surgeon: Cherre Robins, MD;  Location: [place] [facility];  Service: Cardiovascular;  Laterality: Left;  Superficial femoral   POSTERIOR CERVICAL FUSION/FORAMINOTOMY N/A 01/28/2017   Procedure: POSTERIOR SPINAL FUSION CERVICAL 3-4, CERVICAL 4-5, CERVICAL 5-6, CERVICAL 6-7 WITH INSTRUMENATION AND ALLOGRAFT;  Surgeon: Phylliss Bob, MD;  Location: [place];  Service: Orthopedics;  Laterality: N/A;  POSTERIOR SPINAL FUSION CERVICAL 3-4, CERVICAL 4-5, CERVICAL 5-6, CERVICAL 6-7 WITH INSTRUMENATION AND ALLOGRAFT   SHOULDER ARTHROSCOPY/ DEBRIDEMENT LABRAL TEAR/  BICEPS TENOTOMY Left 09-24-2011   TONSILLECTOMY  as child   TRANSTHORACIC ECHOCARDIOGRAM  02-21-2010   normal LVF/  ef 55-60%/ mild to moderate MR/  moderate LAE/  mild TR   YAG LASER APPLICATION Right 12/22/4578   Procedure: YAG LASER APPLICATION;  Surgeon: Myrtha Mantis., MD;  [facility];  Service: Ophthalmology;  Laterality: Right;   YAG LASER CAPSULOTOMY, LEFT EYE  01-08-2011    Social History: Social History   Tobacco Use   Smoking status: Every Day    Packs/day: 1.00    Years: 59.00    Pack years: 59.00    Types: Cigarettes   Smokeless tobacco: Never   Tobacco comments:    Started Chantix 10/26/16  Vaping Use   Vaping Use: Never used  Substance Use Topics   Alcohol use: No    Comment: none since approx 2014- heavy before   Drug use: No   Additional social history: lives with wife  Please also refer to relevant sections of EMR.  Family History: Family History  Problem Relation Age of Onset   Unexplained death Mother        died at a young age, no known cause   Heart attack Father        age unknown   Diabetes Other    Cancer Other     Allergies and  Medications: Allergies  Allergen Reactions   Tramadol     Confusion (intolerance)   No current facility-administered medications on file prior to encounter.   Current Outpatient Medications on File Prior to Encounter  Medication Sig Dispense Refill   acetaminophen (TYLENOL) 325 MG tablet Take 2 tablets (650 mg total) by mouth every 4 (four) hours as needed for mild pain (or temp > 37.5 C (99.5 F)). (Patient not taking: Reported on 09/15/2021)     amitriptyline (ELAVIL) 25 MG tablet Take 10 mg by mouth daily.     AZOPT 1 % ophthalmic suspension Place 1 drop into both eyes 3 (three) times daily.     bimatoprost (LUMIGAN) 0.01 % SOLN Place 1 drop into both eyes at bedtime. 30 mL 3   brimonidine (ALPHAGAN) 0.2 % ophthalmic solution Place 1 drop into both eyes 2 (two) times daily.     Brinzolamide-Brimonidine (SIMBRINZA) 1-0.2 % SUSP Place 1 drop into both eyes 3 (three) times daily.     clopidogrel (PLAVIX) 75 MG tablet Take 75 mg by mouth daily.     DULoxetine (CYMBALTA) 60 MG capsule Take 60 mg by mouth daily.     esomeprazole (NEXIUM) 40 MG capsule Take 1 capsule (40 mg total) by mouth daily. (Patient taking differently: Take 40 mg by mouth daily as needed (acid reflux/indigestion.).) 30 capsule 2   ferrous sulfate 325 (65 FE) MG EC tablet Take 325 mg by mouth daily.     finasteride (PROSCAR) 5 MG tablet Take 5 mg by mouth daily.     gabapentin (NEURONTIN) 300 MG capsule Take 300 mg by mouth daily.     hydrochlorothiazide (HYDRODIURIL) 12.5 MG tablet Take 12.5 mg by mouth daily.     losartan (COZAAR) 100 MG tablet Take 100 mg by mouth daily.     meclizine (ANTIVERT) 25 MG tablet Take 25 mg by mouth daily as needed for dizziness.     MYRBETRIQ 25 MG TB24 tablet Take 25 mg by mouth daily.     Omega-3 Fatty Acids (FISH OIL) 1000 MG CPDR Take 1,000 mg by mouth daily.     oxybutynin (DITROPAN) 5 MG tablet Take 10 mg  by mouth every morning.     oxybutynin (DITROPAN-XL) 10 MG 24 hr tablet Take 10  mg by mouth daily.     oxyCODONE-acetaminophen (PERCOCET) 10-325 MG tablet Take 1 tablet by mouth every 4 (four) hours as needed for pain.     oxyCODONE-acetaminophen (PERCOCET) 10-325 MG tablet Take 1 tablet by mouth every 8 (eight) hours as needed for up to 7 days for pain. 21 tablet 0   pravastatin (PRAVACHOL) 80 MG tablet Take 80 mg by mouth daily.   2   tamsulosin (FLOMAX) 0.4 MG CAPS capsule Take 0.4 mg by mouth daily.      Objective: BP 125/74    Pulse 65    Temp 98.2 F (36.8 C) (Oral)    Resp 16    Ht 6\' 2"  (1.88 m)    Wt 74.8 kg    SpO2 99%    BMI 21.18 kg/m  Exam: General: NAD, alert and oriented, responsive to all questions Eyes: EOMI, pupils equal and reactive  ENTM: Mildly dry mucous membranes, normocephalic atraumatic Neck: ROM intact Cardiovascular: RRR no m/r/g Respiratory: CTAB no w/r/c, no iWOB Gastrointestinal: Nontender to palpation, soft, BS normoactive MSK: No lower extremity edema Derm: No rashes or lesions  Neuro: CN II: PERRL CN III, IV,VI: EOMI CV V: Normal sensation in V1, V2, V3 CVII: Symmetric smile and brow raise CN VIII: Normal hearing CN IX,X: Symmetric palate raise  CN XI: 5/5 shoulder shrug CN XII: Symmetric tongue protrusion  UE and LE strength 5/5 Normal sensation in UE and LE bilaterally  Psych: Mood appropriate  Labs and Imaging: CBC BMET  Recent Labs  Lab 02/01/22 1225 02/01/22 1308  WBC 9.1  --   HGB 7.8* 9.5*  HCT 26.1* 28.0*  PLT 619*  --    Recent Labs  Lab 02/01/22 1225 02/01/22 1308  NA 135 133*  K 3.7 3.7  CL 98 101  CO2 22  --   BUN 55* 56*  CREATININE 2.32* 2.50*  GLUCOSE 119* 122*  CALCIUM 10.2  --      EKG: sinus rhythm with LVH and no ST elevation  CT HEAD WO CONTRAST  Result Date: 02/01/2022 CLINICAL DATA:  Neuro deficit, acute, stroke suspected.  Syncope. EXAM: CT HEAD WITHOUT CONTRAST TECHNIQUE: Contiguous axial images were obtained from the base of the skull through the vertex without intravenous  contrast. RADIATION DOSE REDUCTION: This exam was performed according to the departmental dose-optimization program which includes automated exposure control, adjustment of the mA and/or kV according to patient size and/or use of iterative reconstruction technique. COMPARISON:  Head CT 09/15/2021 FINDINGS: Brain: There is no evidence of an acute infarct, intracranial hemorrhage, mass, midline shift, or extra-axial fluid collection. Subdural collections at the vertex on the prior CT have resolved. There is mild cerebral atrophy. Patchy hypodensities in the cerebral white matter bilaterally are unchanged and nonspecific but compatible with moderate chronic small vessel ischemic disease. Vascular: Calcified atherosclerosis at the skull base. No hyperdense vessel. Skull: Partial coverage of previously-seen left frontal skull, skull base, and maxillofacial fractures. Sinuses/Orbits: Bilateral cataract extraction. Visualized paranasal sinuses and mastoid air cells are clear. Other: None. IMPRESSION: 1. No evidence of acute intracranial abnormality. 2. Moderate chronic small vessel ischemic disease. Electronically Signed   By: Logan Bores M.D.   On: 02/01/2022 13:57   DG Chest Portable 1 View  Result Date: 02/01/2022 CLINICAL DATA:  Weakness. EXAM: PORTABLE CHEST 1 VIEW COMPARISON:  09/15/2021 FINDINGS: 1232 hours. The lungs are clear  without focal pneumonia, edema, pneumothorax or pleural effusion. The cardiopericardial silhouette is within normal limits for size. The visualized bony structures of the thorax show no acute abnormality. Telemetry leads overlie the chest. IMPRESSION: No active disease. Electronically Signed   By: Misty Stanley M.D.   On: 02/01/2022 13:11     Gerrit Heck, MD 02/01/2022, 3:08 PM PGY-1, [place] Intern pager: [phone], text pages welcome

## 2022-02-02 ENCOUNTER — Inpatient Hospital Stay (HOSPITAL_COMMUNITY): Payer: Medicare Other

## 2022-02-02 DIAGNOSIS — R55 Syncope and collapse: Secondary | ICD-10-CM

## 2022-02-02 LAB — TYPE AND SCREEN
ABO/RH(D): O POS
Antibody Screen: NEGATIVE
Unit division: 0

## 2022-02-02 LAB — CBC
HCT: 24.9 % — ABNORMAL LOW (ref 39.0–52.0)
HCT: 25.8 % — ABNORMAL LOW (ref 39.0–52.0)
Hemoglobin: 7.6 g/dL — ABNORMAL LOW (ref 13.0–17.0)
Hemoglobin: 8.1 g/dL — ABNORMAL LOW (ref 13.0–17.0)
MCH: 21.7 pg — ABNORMAL LOW (ref 26.0–34.0)
MCH: 22.1 pg — ABNORMAL LOW (ref 26.0–34.0)
MCHC: 30.5 g/dL (ref 30.0–36.0)
MCHC: 31.4 g/dL (ref 30.0–36.0)
MCV: 70.9 fL — ABNORMAL LOW (ref 80.0–100.0)
MCV: 70.3 fL — ABNORMAL LOW (ref 80.0–100.0)
Platelets: 459 10*3/uL — ABNORMAL HIGH (ref 150–400)
Platelets: 487 10*3/uL — ABNORMAL HIGH (ref 150–400)
RBC: 3.51 MIL/uL — ABNORMAL LOW (ref 4.22–5.81)
RBC: 3.67 MIL/uL — ABNORMAL LOW (ref 4.22–5.81)
RDW: 21.3 % — ABNORMAL HIGH (ref 11.5–15.5)
RDW: 21.3 % — ABNORMAL HIGH (ref 11.5–15.5)
WBC: 10.3 10*3/uL (ref 4.0–10.5)
WBC: 9.4 10*3/uL (ref 4.0–10.5)
nRBC: 0 % (ref 0.0–0.2)
nRBC: 0 % (ref 0.0–0.2)

## 2022-02-02 LAB — BPAM RBC
Blood Product Expiration Date: 202303082359
ISSUE DATE / TIME: 202302122234
Unit Type and Rh: 5100

## 2022-02-02 LAB — BASIC METABOLIC PANEL
Anion gap: 10 (ref 5–15)
BUN: 48 mg/dL — ABNORMAL HIGH (ref 8–23)
CO2: 21 mmol/L — ABNORMAL LOW (ref 22–32)
Calcium: 8.7 mg/dL — ABNORMAL LOW (ref 8.9–10.3)
Chloride: 102 mmol/L (ref 98–111)
Creatinine, Ser: 1.94 mg/dL — ABNORMAL HIGH (ref 0.61–1.24)
GFR, Estimated: 34 mL/min — ABNORMAL LOW (ref >60)
Glucose, Bld: 93 mg/dL (ref 70–99)
Potassium: 3.3 mmol/L — ABNORMAL LOW (ref 3.5–5.1)
Sodium: 133 mmol/L — ABNORMAL LOW (ref 135–145)

## 2022-02-02 LAB — TSH: TSH: 0.686 u[IU]/mL (ref 0.350–4.500)

## 2022-02-02 LAB — ECHOCARDIOGRAM COMPLETE
Calc EF: 49.4 %
Height: 74 in
S' Lateral: 3.9 cm
Single Plane A2C EF: 51.1 %
Single Plane A4C EF: 46.7 %
Weight: 2640 oz

## 2022-02-02 MED ORDER — POTASSIUM CHLORIDE CRYS ER 20 MEQ PO TBCR
40.0000 meq | EXTENDED_RELEASE_TABLET | Freq: Once | ORAL | Status: AC
  Administered 2022-02-02: 40 meq via ORAL
  Filled 2022-02-02: qty 2

## 2022-02-02 MED ORDER — SODIUM CHLORIDE 0.9 % IV SOLN: INTRAVENOUS | Status: DC

## 2022-02-02 NOTE — Evaluation (Signed)
Physical Therapy Evaluation Patient Details Name: Russell [name] MRN: [id number] DOB: 1941/11/13 Today's Date: 02/02/2022  History of Present Illness  Pt is a 81 y.o. M who presents 02/01/2022 with dizziness and fall. CT head showed no acute intracranial abnormalities. Labs showed AKI and hemoglobin of 7.8. Significant PMH: subdural hematomas with frequent falls, alcohol use disorder, HTN, PAD, COPD.  Clinical Impression  PTA, pt lives with his spouse and is a limited household ambulator; "furniture walks," household distances and uses power w/c for community distances in light of frequent falls. Pt agreeable to limited PT evaluation; states he wants to rest and does not want anyone talking to him or monitoring him. Pt able to stand edge of bed with supervision. Unable to sustain static standing for ~10 seconds before sitting back down due to dizziness. Pt with systolic drop from sitting to standing, but not positive for orthostatic hypotension. Will continue to progress mobility as tolerated.  Vitals: Supine: 115/87 Sitting: 113/62 (77) Standing: 99/66 (77)     Recommendations for follow up therapy are one component of a multi-disciplinary discharge planning process, led by the attending physician.  Recommendations may be updated based on patient status, additional functional criteria and insurance authorization.  Follow Up Recommendations Home health PT    Assistance Recommended at Discharge Frequent or constant Supervision/Assistance  Patient can return home with the following  A little help with walking and/or transfers;Assist for transportation;Assistance with cooking/housework    Equipment Recommendations None recommended by PT  Recommendations for Other Services       Functional Status Assessment Patient has had a recent decline in their functional status and demonstrates the ability to make significant improvements in function in a reasonable and predictable amount of time.      Precautions / Restrictions Precautions Precautions: Fall Restrictions Weight Bearing Restrictions: No      Mobility  Bed Mobility Overal bed mobility: Needs Assistance Bed Mobility: Supine to Sit, Sit to Supine     Supine to sit: Supervision Sit to supine: Supervision        Transfers Overall transfer level: Needs assistance Equipment used: None Transfers: Sit to/from Stand Sit to Stand: Supervision           General transfer comment: no physical assist required, pt unable to sustain static standing > 10 seconds    Ambulation/Gait                  Stairs            Wheelchair Mobility    Modified Rankin (Stroke Patients Only)       Balance Overall balance assessment: Mild deficits observed, not formally tested                                           Pertinent Vitals/Pain Pain Assessment Pain Assessment: No/denies pain    Home Living Family/patient expects to be discharged to:: Private residence Living Arrangements: Spouse/significant other;Children;Other relatives (daughter, grandson) Available Help at Discharge: Family;Available 24 hours/day Type of Home: House Home Access: Ramped entrance       Home Layout: One level Home Equipment: Conservation officer, nature (2 wheels);Cane - single point;Wheelchair - power      Prior Function Prior Level of Function : Needs assist             Mobility Comments: furniture walks for household distances, uses power w/c for  community distances       Hand Dominance   Dominant Hand: Right    Extremity/Trunk Assessment   Upper Extremity Assessment Upper Extremity Assessment: Defer to OT evaluation    Lower Extremity Assessment Lower Extremity Assessment: Generalized weakness       Communication   Communication: No difficulties  Cognition Arousal/Alertness: Awake/alert Behavior During Therapy: WFL for tasks assessed/performed Overall Cognitive Status: Difficult to  assess                                 General Comments: Difficult to assess cognition due to pt unwillingness to answer questions or interact with PT. Pt very visibly frustrated, wanting to rest, stating he does not want anyone to talk to him or monitor him, but lacks awareness of necessity of tests to determine cause of dizziness        General Comments      Exercises     Assessment/Plan    PT Assessment Patient needs continued PT services  PT Problem List Decreased strength;Decreased activity tolerance;Decreased balance;Decreased mobility       PT Treatment Interventions DME instruction;Gait training;Functional mobility training;Therapeutic activities;Therapeutic exercise;Balance training;Patient/family education    PT Goals (Current goals can be found in the Care Plan section)  Acute Rehab PT Goals Patient Stated Goal: go home PT Goal Formulation: With patient Time For Goal Achievement: 02/16/22 Potential to Achieve Goals: Good    Frequency Min 3X/week     Co-evaluation               AM-PAC PT "6 Clicks" Mobility  Outcome Measure Help needed turning from your back to your side while in a flat bed without using bedrails?: None Help needed moving from lying on your back to sitting on the side of a flat bed without using bedrails?: A Little Help needed moving to and from a bed to a chair (including a wheelchair)?: A Little Help needed standing up from a chair using your arms (e.g., wheelchair or bedside chair)?: A Little Help needed to walk in hospital room?: A Little Help needed climbing 3-5 steps with a railing? : A Lot 6 Click Score: 18    End of Session   Activity Tolerance: Other (comment) (self limiting) Patient left: in bed;with call bell/phone within reach;with bed alarm set Nurse Communication: Mobility status PT Visit Diagnosis: Difficulty in walking, not elsewhere classified (R26.2);Dizziness and giddiness (R42)    Time:  6063-0160 PT Time Calculation (min) (ACUTE ONLY): 14 min   Charges:   PT Evaluation $PT Eval Low Complexity: [place], PT, DPT Acute Rehabilitation Services Pager [phone] Office [phone]   [name] 02/02/2022, 11:22 AM

## 2022-02-02 NOTE — Progress Notes (Signed)
Family Medicine Teaching Service Daily Progress Note Intern Pager: [phone]  Patient name: Russell [name] Medical record number: [id number] Date of birth: 1941-05-06 Age: 81 y.o. Gender: male  Primary Care Provider: Marcie Mowers, FNP Consultants: None  Code Status: Full  Pt Overview and Major Events to Date:  2/13 admitted 1 U pRBC  Assessment and Plan: RYE DECOSTE is a 81 y.o. male presenting with dizziness and following and fall. PMH is significant for history of subdural hematomas with frquent falls, alcohol use disorder, hypertension, PAD, COPD  Pre-syncope 2/2 to Orthostasis This morning patient feels fine. BP ranges 106-140/51-91. S/p 1 U pRBC. Can consider parkinson like disease. UDS negative. UA with moderate leukocytes.  Hemoglobin 7.6 from 7.8 yesterday repeat. -f/u echocardiogram -IVF to end  -cardiac monitoring -keep O2 sats 88-92% -PT/OT  Hypokalemia K 3.3. -repleted 40 meq  Iron Def Anemia Hemoglobin 7.6 from 7.8 on admission.  -Monitor on CBC -Consider GI consult if worsening -protonix 40 mg daily -threshold to transfuse 8 given hx of PAD  AKI on CKD 3b Creatinine today is 1.94 from 2.5. Received IVF yesterday. -monitor on BMP -avoid nephrotoxic agents  HTN BP ranges of 106-140/51-91. -Hold home HCTZ and losartan  Alcohol Use Disorder CIWAs were 1>2 -monitor  BPH/Overactive Bladder UOP is 1.626 L -Hold tamsulosis and myrbetriq  Chronic/stable Neuroma Left foot-monitor GERD- protonix 40 mg HLD-pravastatin 80 mg Tobacco- monitor Hx of YAG Capsulotomy-continue home eye drops  FEN/GI: Heart healthy PPx: SCDs Dispo: Home likely today pending PT/echo  Subjective:  Patient this morning says he does not have any acute issues or pain.  Said he didciwa  not had a bowel movement yesterday  Objective: Temp:  [98.2 F (36.8 C)-98.7 F (37.1 C)] 98.6 F (37 C) (02/13 0157) Pulse Rate:  [57-101] 61 (02/13 0157) Resp:  [10-22] 17  (02/13 0157) BP: (106-140)/(51-91) 112/51 (02/13 0157) SpO2:  [94 %-100 %] 98 % (02/13 0157) Weight:  [74.8 kg] 74.8 kg (02/12 1208) Physical Exam: General: NAD, laying in bed Cardiovascular: RRR no murmurs rubs or gallops Respiratory: Clear to auscultation bilaterally no wheezes rales or crackles Abdomen: Nontender, soft Extremities: no lower extremity   Laboratory: Recent Labs  Lab 02/01/22 1225 02/01/22 1308  WBC 9.1  --   HGB 7.8* 9.5*  HCT 26.1* 28.0*  PLT 619*  --    Recent Labs  Lab 02/01/22 1225 02/01/22 1308  NA 135 133*  K 3.7 3.7  CL 98 101  CO2 22  --   BUN 55* 56*  CREATININE 2.32* 2.50*  CALCIUM 10.2  --   PROT 8.3*  --   BILITOT 0.5  --   ALKPHOS 54  --   ALT 15  --   AST 13*  --   GLUCOSE 119* 122*    Imaging/Diagnostic Tests: CT HEAD WO CONTRAST  Result Date: 02/01/2022 CLINICAL DATA:  Neuro deficit, acute, stroke suspected.  Syncope. EXAM: CT HEAD WITHOUT CONTRAST TECHNIQUE: Contiguous axial images were obtained from the base of the skull through the vertex without intravenous contrast. RADIATION DOSE REDUCTION: This exam was performed according to the departmental dose-optimization program which includes automated exposure control, adjustment of the mA and/or kV according to patient size and/or use of iterative reconstruction technique. COMPARISON:  Head CT 09/15/2021 FINDINGS: Brain: There is no evidence of an acute infarct, intracranial hemorrhage, mass, midline shift, or extra-axial fluid collection. Subdural collections at the vertex on the prior CT have resolved. There is mild cerebral atrophy.  Patchy hypodensities in the cerebral white matter bilaterally are unchanged and nonspecific but compatible with moderate chronic small vessel ischemic disease. Vascular: Calcified atherosclerosis at the skull base. No hyperdense vessel. Skull: Partial coverage of previously-seen left frontal skull, skull base, and maxillofacial fractures. Sinuses/Orbits:  Bilateral cataract extraction. Visualized paranasal sinuses and mastoid air cells are clear. Other: None. IMPRESSION: 1. No evidence of acute intracranial abnormality. 2. Moderate chronic small vessel ischemic disease. Electronically Signed   By: Logan Bores M.D.   On: 02/01/2022 13:57   DG Chest Portable 1 View  Result Date: 02/01/2022 CLINICAL DATA:  Weakness. EXAM: PORTABLE CHEST 1 VIEW COMPARISON:  09/15/2021 FINDINGS: 1232 hours. The lungs are clear without focal pneumonia, edema, pneumothorax or pleural effusion. The cardiopericardial silhouette is within normal limits for size. The visualized bony structures of the thorax show no acute abnormality. Telemetry leads overlie the chest. IMPRESSION: No active disease. Electronically Signed   By: Misty Stanley M.D.   On: 02/01/2022 13:11     Gerrit Heck, MD 02/02/2022, 4:17 AM PGY-1, [place] Intern pager: [phone], text pages welcome

## 2022-02-02 NOTE — Progress Notes (Addendum)
FMTS Brief Note  Called wife for update. Has had ongoing issues with dizziness for >6 months since his fall with intracranial hemorrhage. He has had ongoing dizziness and syncope for this time, more than once a week sometimes. Had several 'falling out spells' a month. No new change in memory. He often refuses evaluation in ED or by clinicians she reports. Lives alone with her. Discussed current care plan, imaging, and labs. All questions answered.  Dorris Singh, MD  Family Medicine Teaching Service

## 2022-02-02 NOTE — Progress Notes (Signed)
OT Cancellation Note  Patient Details Name: Russell [name] MRN: [id number] DOB: 15-May-1941   Cancelled Treatment:    Reason Eval/Treat Not Completed: Patient declined, no reason specified ("I just want to lay her and be silent." Providing additional education on benefit of OOB acitvity. Pt further declined. WIll return as schedule allows.)  [place], OTR/L Acute Rehab Pager: [phone] Office: [phone] 02/02/2022, 3:04 PM

## 2022-02-02 NOTE — Progress Notes (Signed)
FPTS Interim Progress Note  S:Patient sleeping and resting comfortably.    O: BP 128/69    Pulse 73    Temp 98.5 F (36.9 C) (Oral)    Resp 20    Ht 6\' 2"  (1.88 m)    Wt 74.8 kg    SpO2 97%    BMI 21.18 kg/m    GEN: sleeping  RESP: equal chest rise and fall CVS:  NSR on cardiac monitor    A/P: No CIWAs documented.  Follow up post transfusion H/H.  No changes to current plan. See daily progress note.  - Orders reviewed. Labs for AM ordered, which was adjusted as needed.   Lyndee Hensen, DO 02/02/2022, 12:17 AM PGY-3, [place] Night Resident  Please page [phone] with questions.

## 2022-02-02 NOTE — Progress Notes (Signed)
OT Cancellation Note  Patient Details Name: Russell [name] MRN: [id number] DOB: 10-30-1941   Cancelled Treatment:    Reason Eval/Treat Not Completed: Patient at procedure or test/ unavailable. Finishing up with PT and getting back to bed. Stated he wants to rest. Will return as schedule allows. Thank you.   [place], OTR/L Acute Rehab Pager: [phone] Office: [phone] 02/02/2022, 10:42 AM

## 2022-02-02 NOTE — Hospital Course (Addendum)
°  Russell [name] is a 81 y.o. male presenting with dizziness and following and fall. PMH is significant for history of subdural hematomas with frquent falls, alcohol use disorder, hypertension, PAD, COPD  Pre-syncope, likely due to orthostasis Patient admitted for work-up of dizziness given that he had felt lightheaded this morning and leaned on the wall until he dropped to the floor. Patient went from 924 systolic to 90 systolic from sitting to standing position. Denies any loss of consciousness. CT head without acute abnormalities obtained due to history of subdural hemmorhages which was negative. Echocardiogram was obtained which showed Left ventricular ejection fraction of 45 to 50%. The left ventricle demonstrates global hypokinesis. Was given 1 U of blood for anemia under 8. Dizziness like 2/2 to multiple antihypertensives and orthostatic worsening medications. At end of discharge patient denied any dizziness and improved off of these medications.  Chronic iron deficiency anemia Hemoglobin of 7.8 on admission received 1 unit PRBC.  At end of hospitalization patient's hemoglobin was 7.9 stable around his baseline of 8.  He denied any abdominal pain and FOBT was negative.  Was restarted on home iron pills at discharge.  AKI on CKD 3b Creatinine was 2.32 on admission and came down to 1.34 with IV fluid resuscitation on first day and increased p.o. intake along with holding home antihypertensives.

## 2022-02-03 ENCOUNTER — Other Ambulatory Visit: Payer: Self-pay | Admitting: Student

## 2022-02-03 LAB — BASIC METABOLIC PANEL
Anion gap: 10 (ref 5–15)
BUN: 24 mg/dL — ABNORMAL HIGH (ref 8–23)
CO2: 20 mmol/L — ABNORMAL LOW (ref 22–32)
Calcium: 8.6 mg/dL — ABNORMAL LOW (ref 8.9–10.3)
Chloride: 105 mmol/L (ref 98–111)
Creatinine, Ser: 1.34 mg/dL — ABNORMAL HIGH (ref 0.61–1.24)
GFR, Estimated: 53 mL/min — ABNORMAL LOW (ref >60)
Glucose, Bld: 90 mg/dL (ref 70–99)
Potassium: 3.9 mmol/L (ref 3.5–5.1)
Sodium: 135 mmol/L (ref 135–145)

## 2022-02-03 LAB — CBC
HCT: 24.9 % — ABNORMAL LOW (ref 39.0–52.0)
Hemoglobin: 7.9 g/dL — ABNORMAL LOW (ref 13.0–17.0)
MCH: 22.6 pg — ABNORMAL LOW (ref 26.0–34.0)
MCHC: 31.7 g/dL (ref 30.0–36.0)
MCV: 71.1 fL — ABNORMAL LOW (ref 80.0–100.0)
Platelets: 493 10*3/uL — ABNORMAL HIGH (ref 150–400)
RBC: 3.5 MIL/uL — ABNORMAL LOW (ref 4.22–5.81)
RDW: 21.2 % — ABNORMAL HIGH (ref 11.5–15.5)
WBC: 9.8 10*3/uL (ref 4.0–10.5)
nRBC: 0 % (ref 0.0–0.2)

## 2022-02-03 MED ORDER — PANTOPRAZOLE SODIUM 40 MG PO TBEC: 40.0000 mg | DELAYED_RELEASE_TABLET | Freq: Every day | ORAL | 0 refills | Status: DC

## 2022-02-03 NOTE — Progress Notes (Signed)
Lab made  aware that he consented to have blood drawn which he refused this am. MD talked to the patient regarding this matter.

## 2022-02-03 NOTE — Evaluation (Signed)
Occupational Therapy Evaluation Patient Details Name: Russell [name] MRN: [id number] DOB: 1941-04-12 Today's Date: 02/03/2022   History of Present Illness 81 y.o. M who presents 02/01/2022 with dizziness and fall. CT head showed no acute intracranial abnormalities. Labs showed AKI and hemoglobin of 7.8. Significant PMH: subdural hematomas with frequent falls, alcohol use disorder, HTN, PAD, COPD.   Clinical Impression   PTA, pt was living with his wife and performing ADLs. Pt currently performing ADLs and functional mobility at [facility]  level. Pt refusing therapy and mobility; however, agreeable to perform mobility to bathroom. Pt stating "you can just take your therapy and go back where you came from." Feel pt is at his functional and cognitive baseline. Declining further therapy. Will sign off with no further acute OT needs. Recommend dc to home without follow up.       Recommendations for follow up therapy are one component of a multi-disciplinary discharge planning process, led by the attending physician.  Recommendations may be updated based on patient status, additional functional criteria and insurance authorization.   Follow Up Recommendations  No OT follow up    Assistance Recommended at Discharge PRN  Patient can return home with the following Assist for transportation    Functional Status Assessment  Patient has had a recent decline in their functional status and demonstrates the ability to make significant improvements in function in a reasonable and predictable amount of time.  Equipment Recommendations  None recommended by OT    Recommendations for Other Services       Precautions / Restrictions Precautions Precautions: Fall Precaution Comments: Poor safety awareness      Mobility Bed Mobility Overal bed mobility: Needs Assistance Bed Mobility: Supine to Sit     Supine to sit: Supervision          Transfers Overall transfer level: Needs  assistance Equipment used: None Transfers: Sit to/from Stand Sit to Stand: Supervision                  Balance Overall balance assessment: Mild deficits observed, not formally tested                                         ADL either performed or assessed with clinical judgement   ADL Overall ADL's : Needs assistance/impaired                                       General ADL Comments: Pt performing at [employer] A level. Very poor safety. Refusing activity but thne agreeable to stand and change linens and gown. Going ot toilet iwhtout assistance.     Vision         Perception     Praxis      Pertinent Vitals/Pain Pain Assessment Pain Assessment: No/denies pain     Hand Dominance Right   Extremity/Trunk Assessment Upper Extremity Assessment Upper Extremity Assessment: Generalized weakness   Lower Extremity Assessment Lower Extremity Assessment: Defer to PT evaluation       Communication Communication Communication: No difficulties   Cognition Arousal/Alertness: Awake/alert Behavior During Therapy: Impulsive Overall Cognitive Status: Difficult to assess  General Comments: Pt very against walking and telling therapists "you can just go back to where you come from and take your walking with you."  Pt reporting his premofit not working - agreeable to change linens. When therapist going to get new monitor sticker - pt walking into bathroom and unhooking his IV line. Pt declining education that he should not unplug his IV or walk without assistance Due to syncopy.     General Comments  VSS on RA    Exercises     Shoulder Instructions      Home Living Family/patient expects to be discharged to:: Private residence Living Arrangements: Spouse/significant other;Children;Other relatives (daughter, grandson) Available Help at Discharge: Family;Available 24  hours/day Type of Home: House Home Access: Ramped entrance     Home Layout: One level     Bathroom Shower/Tub: Occupational psychologist: Handicapped height     Home Equipment: Conservation officer, nature (2 wheels);Cane - single point;Wheelchair - power          Prior Functioning/Environment Prior Level of Function : Needs assist             Mobility Comments: furniture walks for household distances, uses power w/c for community distances ADLs Comments: ADLs        OT Problem List: Decreased strength;Decreased activity tolerance;Decreased range of motion;Impaired balance (sitting and/or standing);Decreased cognition;Decreased safety awareness;Decreased knowledge of use of DME or AE;Decreased knowledge of precautions      OT Treatment/Interventions:      OT Goals(Current goals can be found in the care plan section) Acute Rehab OT Goals Patient Stated Goal: "I am gunna go home" OT Goal Formulation: All assessment and education complete, DC therapy  OT Frequency:      Co-evaluation              AM-PAC OT "6 Clicks" Daily Activity     Outcome Measure Help from another person eating meals?: None Help from another person taking care of personal grooming?: A Little Help from another person toileting, which includes using toliet, bedpan, or urinal?: A Little Help from another person bathing (including washing, rinsing, drying)?: A Little Help from another person to put on and taking off regular upper body clothing?: None Help from another person to put on and taking off regular lower body clothing?: A Little 6 Click Score: 20   End of Session Nurse Communication: Mobility status  Activity Tolerance: Patient tolerated treatment well;Other (comment) (Limited by participation) Patient left: in bed;with call bell/phone within reach;with bed alarm set;with nursing/sitter in room  OT Visit Diagnosis: Other abnormalities of gait and mobility (R26.89);Unsteadiness on feet  (R26.81);Muscle weakness (generalized) (M62.81)                Time: 5170-0174 OT Time Calculation (min): 17 min Charges:  OT General Charges $OT Visit: 1 Visit OT Evaluation $OT Eval Moderate Complexity: [place], OTR/L Acute Rehab Pager: [phone] Office: [place] 02/03/2022, 12:07 PM

## 2022-02-03 NOTE — Discharge Instructions (Signed)
Dear [name],   Thank you so much for allowing [place] to be part of your care!  You were admitted to [facility] for  dizziness. Your head scans were normal and we stopped some medicines that could contribute to that. You received some blood as well since your blood levels are low.  POST-HOSPITAL & CARE INSTRUCTIONS Please let PCP/Specialists know of any changes that were made.  Please see medications section of this packet for any medication changes.   DOCTOR'S APPOINTMENT & FOLLOW UP CARE INSTRUCTIONS  Future Appointments  Date Time Provider [place]  02/10/2022  9:45 AM Hyatt, Max T, DPM [facility]    RETURN PRECAUTIONS: If you have chest pain, shortness of breath or bleeding  Take care and be well!  [facility]  [place], [place] [address] [phone]

## 2022-02-03 NOTE — Progress Notes (Signed)
Patient refused labs this am. He states " I haven't seen a doctor in two day so why do they need my blood?" RN educated patient on importance of lab work and patient still refusing. Will continue to monitor.

## 2022-02-03 NOTE — Progress Notes (Signed)
FPTS Interim Progress Note  S:Patient sleeping and resting comfortably.    O: BP (!) 109/51 (BP Location: Right Arm)    Pulse 61    Temp 98.5 F (36.9 C) (Oral)    Resp 17    Ht 6\' 2"  (1.88 m)    Wt 74.8 kg    SpO2 95%    BMI 21.18 kg/m    GEN: sleeping  RESP: equal chest rise and fall CVS:  NSR on cardiac monitor    A/P: No changes to current plan. See daily progress note.  - Orders reviewed. Labs for AM ordered, which was adjusted as needed.   Lyndee Hensen, DO 02/03/2022, 12:53 AM PGY-3, [place] [facility] Night Resident  Please page [phone] with questions.

## 2022-02-03 NOTE — Progress Notes (Signed)
Voided to 300 ml yellow clear.urine. requested to condom cath .  Encouraged to use urinal but refused claiming that he will wet himself. Noted to be irritable and rude refusing to walk with PT claiming he did not come here to walk. Explained the importance of ambulation does not want to listen .

## 2022-02-03 NOTE — TOC Initial Note (Signed)
Transition of Care [facility]) - Initial/Assessment Note    Patient Details  Name: Russell [name] MRN: [id number] Date of Birth: Sep 06, 1941  Transition of Care [facility]) CM/SW Contact:    Angelita Ingles, RN Phone Number:[phone]  02/03/2022, 3:31 PM  Clinical Narrative:                 [facility] consulted for patient discharging home with [facility] PT recommendations. Cm at bedside to offer choice for [facility]. Patient refused stating that he doesn't need anything. Patient states that he comes from home with his wife and he doesn't need anything. CM attempted to explain Home health and therapy recommendations. Patient interrupts stating I don't need that and asked CM to leave his room and shut the door. CM made nurse aware that patient has refused home health. CM has nothing else to offer.   Expected Discharge Plan: [place] Barriers to Discharge: No Barriers Identified   Patient Goals and CMS Choice Patient states their goals for this hospitalization and ongoing recovery are:: refused CMS Medicare.gov Compare Post Acute Care list provided to::  (refused) Choice offered to / list presented to : Patient (refused)  Expected Discharge Plan and Services Expected Discharge Plan: [place] In-house Referral: NA Discharge Planning Services: CM Consult   Living arrangements for the past 2 months: Single Family Home Expected Discharge Date: 02/03/22                         [facility] Arranged: Patient Refused HH          Prior Living Arrangements/Services Living arrangements for the past 2 months: Single Family Home Lives with:: Spouse              Current home services:  (patient refusing to discuss)    Activities of Daily Living      Permission Sought/Granted                  Emotional Assessment              Admission diagnosis:  Syncope and collapse [R55] Dehydration [E86.0] Syncope [R55] AKI (acute kidney injury) ([place]) [N17.9] Anemia,  unspecified type [D64.9] Patient Active Problem List   Diagnosis Date Noted   Pre-syncope 02/01/2022   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   Acute on chronic intracranial subdural hematoma ([place]) 09/15/2021   CKD (chronic kidney disease) stage 3, GFR 30-59 ml/min (HCC) - baseline SCr 2.0 09/15/2021   Cognitive communication deficit 07/09/2021   Intracranial bleed ([place]) 07/07/2021   Fall at home, initial encounter    Traumatic hemorrhage of cerebrum without loss of consciousness ([place])    Osteoarthritis 05/31/2018   Foot pain, left 05/11/2018   Chest pain 06/23/2017   AKI (acute kidney injury) ([place]) 06/23/2017   Surgery, elective    Peripheral neuropathy 01/27/2017   Cervical radiculopathy 01/27/2017   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   Peripheral arterial disease ([place]) 01/23/2016   DM type 2 (diabetes mellitus, type 2) ([place]) 02/22/2015   Anemia 02/22/2015   Neuroma digital nerve 09/28/2013   Neuroma, 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   SEIZURE, 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   BENIGN PROSTATIC HYPERTROPHY, WITH URINARY OBSTRUCTION 06/13/2010   ARM PAIN 06/03/2010   DYSPNEA ON EXERTION 05/15/2010   TOBACCO ABUSE 04/03/2010   COPD 04/03/2010   G E R D 04/03/2010   OTHER DYSPHAGIA 04/03/2010   DIABETES MELLITUS, TYPE II, CONTROLLED 01/14/2010   LUMBAR RADICULOPATHY, LEFT  05/16/2009   DYSPHAGIA UNSPECIFIED 05/16/2009   COUGH DUE TO ACE INHIBITORS 04/02/2009   DIZZINESS 07/27/2008   HYPERCHOLESTEROLEMIA, MIXED 05/11/2008   CATARACTS, BILATERAL 03/01/2008   MILD SPINAL STENOSIS, CERVICAL 01/12/2008   LEG CRAMPS 01/12/2008   EARLY SATIETY 11/04/2007   DIVERTICULOSIS, COLON 08/31/2007   SCABIES 08/26/2007   ERECTILE DYSFUNCTION 08/18/2007   Alcohol abuse 08/18/2007   Essential hypertension 08/17/2007   HEMOCCULT POSITIVE STOOL 07/05/2007   ANEMIA, IRON DEFICIENCY NOS 05/31/2007   WEIGHT LOSS, ABNORMAL 05/10/2006   PCP:  Marcie Mowers, FNP Pharmacy:   [facility] [address] [place] [address] [place] [address] Phone: [phone] Fax: [phone]  [id number] - [place], [place] - [address] [place] [address] Phone: [phone] Fax: [phone]     Social Determinants of Health ([place]) Interventions    Readmission Risk Interventions Readmission Risk Prevention Plan 02/03/2022  Transportation Screening (No Data)  Some recent data might be hidden

## 2022-02-03 NOTE — Discharge Summary (Signed)
[facility] Discharge Summary  Patient name: Russell [name] Medical record number: [id number] Date of birth: 1940-12-27 Age: 81 y.o. Gender: male Date of Admission: 02/01/2022  Date of Discharge: 02/03/2022 Admitting Physician: Gerrit Heck, MD  Primary Care Provider: Marcie Mowers, FNP Consultants: None  Indication for Hospitalization: Pre-syncope  Discharge Diagnoses/Problem List:  Principal Problem:   Pre-syncope   Disposition: Home   Discharge Condition: Stable  Discharge Exam:   General: NAD, A&Ox4 Cardiovascular: RRR no m/r/g Respiratory: CTAB no w/r/c Abdomen: Nondistended, nontender to palpation Extremities: No LE edema  Brief Hospital Course:   Russell [name] is a 81 y.o. male presenting with dizziness and following and fall. PMH is significant for history of subdural hematomas with frquent falls, alcohol use disorder, hypertension, PAD, COPD  Pre-syncope, likely due to orthostasis Patient admitted for work-up of dizziness given that he had felt lightheaded this morning and leaned on the wall until he dropped to the floor. Patient went from 277 systolic to 90 systolic from sitting to standing position. Denies any loss of consciousness. CT head without acute abnormalities obtained due to history of subdural hemmorhages which was negative. Echocardiogram was obtained which showed Left ventricular ejection fraction of 45 to 50%. The left ventricle demonstrates global hypokinesis. Was given 1 U of blood for anemia under 8. Dizziness like 2/2 to multiple antihypertensives and orthostatic worsening medications. At end of discharge patient denied any dizziness and improved off of these medications.  Chronic iron deficiency anemia Hemoglobin of 7.8 on admission received 1 unit PRBC.  At end of hospitalization patient's hemoglobin was 7.9 stable around his baseline of 8.  He denied any abdominal pain and FOBT was negative.  Was restarted on  home iron pills at discharge.  AKI on CKD 3b Creatinine was 2.32 on admission and came down to 1.34 with IV fluid resuscitation on first day and increased p.o. intake along with holding home antihypertensives.  Issues for Follow Up:  Repeat CBC and BMP at follow up with PCP. Consider GI referral given anemia. Consider nephrology referral for worsening kidney function. Home meds HCTZ and losartan held on discharge given normotensive blood pressures, recheck BP at follow up and restart as appropriate.  Cardiology outpatient refer  Significant Procedures:   Significant Labs and Imaging:  Recent Labs  Lab 02/02/22 0628 02/02/22 0845 02/03/22 1144  WBC 10.3 9.4 9.8  HGB 7.6* 8.1* 7.9*  HCT 24.9* 25.8* 24.9*  PLT 459* 487* 493*   Recent Labs  Lab 02/01/22 1225 02/01/22 1308 02/02/22 0628 02/03/22 1144  NA 135 133* 133* 135  K 3.7 3.7 3.3* 3.9  CL 98 101 102 105  CO2 22  --  21* 20*  GLUCOSE 119* 122* 93 90  BUN 55* 56* 48* 24*  CREATININE 2.32* 2.50* 1.94* 1.34*  CALCIUM 10.2  --  8.7* 8.6*  ALKPHOS 54  --   --   --   AST 13*  --   --   --   ALT 15  --   --   --   ALBUMIN 4.7  --   --   --       Results/Tests Pending at Time of Discharge:   Discharge Medications:  Allergies as of 02/03/2022       Reactions   Tramadol    Confusion (intolerance)        Medication List     STOP taking these medications    esomeprazole 20 MG capsule Commonly  known as: NEXIUM   esomeprazole 40 MG capsule Commonly known as: [place] by: pantoprazole 40 MG tablet   Ferrous Sulfate Dried 143 (45 Fe) MG Tbcr   Fish Oil 1000 MG Cpdr   hydrochlorothiazide 12.5 MG tablet Commonly known as: HYDRODIURIL   losartan 100 MG tablet Commonly known as: COZAAR   meclizine 25 MG tablet Commonly known as: ANTIVERT   oxybutynin 10 MG 24 hr tablet Commonly known as: DITROPAN-XL   oxyCODONE-acetaminophen 10-325 MG tablet Commonly known as: Percocet   tamsulosin 0.4 MG  Caps capsule Commonly known as: FLOMAX       TAKE these medications    acetaminophen 325 MG tablet Commonly known as: TYLENOL Take 2 tablets (650 mg total) by mouth every 4 (four) hours as needed for mild pain (or temp > 37.5 C (99.5 F)).   bimatoprost 0.01 % Soln Commonly known as: LUMIGAN Place 1 drop into both eyes at bedtime.   brimonidine 0.2 % ophthalmic solution Commonly known as: ALPHAGAN Place 1 drop into both eyes 3 (three) times daily.   clopidogrel 75 MG tablet Commonly known as: PLAVIX Take 75 mg by mouth daily.   ferrous sulfate 325 (65 FE) MG EC tablet Take 325 mg by mouth daily.   gabapentin 300 MG capsule Commonly known as: NEURONTIN Take 300 mg by mouth daily.   multivitamin with minerals Tabs tablet Take 1 tablet by mouth daily. One a day   Myrbetriq 25 MG Tb24 tablet Generic drug: mirabegron ER Take 25 mg by mouth daily.   pantoprazole 40 MG tablet Commonly known as: PROTONIX Take 1 tablet (40 mg total) by mouth daily. Start taking on: February 04, 2022 Replaces: esomeprazole 40 MG capsule   pravastatin 80 MG tablet Commonly known as: PRAVACHOL Take 80 mg by mouth daily.   Simbrinza 1-0.2 % Susp Generic drug: Brinzolamide-Brimonidine Place 1 drop into both eyes 3 (three) times daily.        Discharge Instructions: Please refer to Patient Instructions section of EMR for full details.  Patient was counseled important signs and symptoms that should prompt return to medical care, changes in medications, dietary instructions, activity restrictions, and follow up appointments.   Follow-Up Appointments:  Follow-up Information     Marcie Mowers, FNP. Schedule an appointment as soon as possible for a visit.   Specialty: Family Medicine Why: Please make an appointment to be seen at your earliest convenience for a hospital follow up. Contact information: [address] [address] [phone]                  Gerrit Heck, MD 02/03/2022, 3:13 PM PGY-1, [place]

## 2022-02-03 NOTE — Progress Notes (Signed)
Family Medicine Teaching Service Daily Progress Note Intern Pager: [phone]  Patient name: Russell [name] Medical record number: [id number] Date of birth: 11-26-1941 Age: 81 y.o. Gender: male  Primary Care Provider: Marcie Mowers, FNP Consultants: None Code Status: Full  Pt Overview and Major Events to Date:  2/13 admitted- 1U pRBC  Assessment and Plan:  Russell [name] is a 81 y.o. male presenting with dizziness and following and fall. PMH is significant for history of subdural hematomas with frquent falls, alcohol use disorder, hypertension, PAD, COPD   Pre-syncope 2/2 to Orthostasis This morning patient feels like he does not feel dizzy when standing up. BP ranges from 109-122/51-64. Echocardiogram showed EF 45-50% with left ventricle showing global hypokinesis. Home health PT was recommended. -cardiology outpatient -keep O2 sats 88-92% -holding BP meds/meds that could worsen orthostasis -PT/OT  Intermittent Bradycardia HR 50-61. Many in the 22s. TSH 0.686 -Monitor  Iron Deficiency Anemia Hgb was stable yesterday -Monitor on CBC -protonix 40 mg daily -threshold to transfuse 8 given hx of PAD  AKI on CKD 3b Creatinine today was stable at 1.94 yesterday. -monitor on BMP -avoid nephrotoxic agents  HTN BP ranges of 109-119/51-87 -monitor -hold home HCTZ and losartan  Alcohol Use Disorder CIWA 1. Denies current use but has heavy hx. -d/c CIWA protocol  BPH    Overactive Bladder UOP was 2.8 L -mybetriq -hold tamsulosin  Chronic/stable Neuroma-monitor GERD-protonix 40 mg HLD-pravastatin 80 mg Tobacco use-monitor, encourage cessation Hx of YAG capsulotomy-home eye drops  FEN/GI: Heart healthy  PPx: SCDs Dispo: Home likely today   Subjective:  Patient was upset and said he "hasn't seen any doctors in two days."  Let him know that I have been seeing him for the past two days. Say that "doctors are not doing their job." Refused labs this morning but  amenable now   Objective: Temp:  [98.1 F (36.7 C)-98.7 F (37.1 C)] 98.7 F (37.1 C) (02/14 0406) Pulse Rate:  [52-61] 54 (02/14 0406) Resp:  [16-18] 16 (02/14 0406) BP: (109-119)/(51-87) 110/51 (02/14 0406) SpO2:  [95 %-98 %] 98 % (02/14 0406) Physical Exam: General: NAD, A&Ox4 Cardiovascular: RRR no m/r/g Respiratory: CTAB no w/r/c Abdomen: Nondistended, nontender to palpation Extremities: No LE edema  Laboratory: Recent Labs  Lab 02/01/22 1225 02/01/22 1308 02/02/22 0628 02/02/22 0845  WBC 9.1  --  10.3 9.4  HGB 7.8* 9.5* 7.6* 8.1*  HCT 26.1* 28.0* 24.9* 25.8*  PLT 619*  --  459* 487*   Recent Labs  Lab 02/01/22 1225 02/01/22 1308 02/02/22 0628  NA 135 133* 133*  K 3.7 3.7 3.3*  CL 98 101 102  CO2 22  --  21*  BUN 55* 56* 48*  CREATININE 2.32* 2.50* 1.94*  CALCIUM 10.2  --  8.7*  PROT 8.3*  --   --   BILITOT 0.5  --   --   ALKPHOS 54  --   --   ALT 15  --   --   AST 13*  --   --   GLUCOSE 119* 122* 93      Imaging/Diagnostic Tests:   Gerrit Heck, MD 02/03/2022, 4:34 AM PGY-1, [place] Intern pager: [phone], text pages welcome

## 2022-02-03 NOTE — Progress Notes (Signed)
All set for discharge home. Awaiting ride. Spoke with wife who is coming to pick him up.

## 2022-02-10 ENCOUNTER — Ambulatory Visit: Payer: Medicare Other | Admitting: Podiatry

## 2022-02-10 ENCOUNTER — Other Ambulatory Visit: Payer: Self-pay | Admitting: Podiatry

## 2022-02-10 ENCOUNTER — Telehealth: Payer: Self-pay | Admitting: Podiatry

## 2022-02-10 MED ORDER — OXYCODONE-ACETAMINOPHEN 10-325 MG PO TABS: 1.0000 | ORAL_TABLET | Freq: Three times a day (TID) | ORAL | 0 refills | Status: DC | PRN

## 2022-02-10 NOTE — Telephone Encounter (Signed)
Patient called and stated he would like a refill on his pain meds. Scheduled for 02/17/2022

## 2022-02-13 ENCOUNTER — Encounter (HOSPITAL_COMMUNITY): Payer: Self-pay | Admitting: Emergency Medicine

## 2022-02-13 ENCOUNTER — Inpatient Hospital Stay (HOSPITAL_COMMUNITY)
Admission: EM | Admit: 2022-02-13 | Discharge: 2022-02-16 | DRG: 312 | Disposition: A | Discharge status: Home / Self Care | Payer: Medicare Other | Attending: Family Medicine | Admitting: Family Medicine

## 2022-02-13 ENCOUNTER — Emergency Department (HOSPITAL_COMMUNITY): Payer: Medicare Other

## 2022-02-13 DIAGNOSIS — R55 Syncope and collapse: Secondary | ICD-10-CM | POA: Diagnosis present

## 2022-02-13 DIAGNOSIS — D509 Iron deficiency anemia, unspecified: Secondary | ICD-10-CM | POA: Diagnosis present

## 2022-02-13 DIAGNOSIS — N189 Chronic kidney disease, unspecified: Secondary | ICD-10-CM | POA: Diagnosis not present

## 2022-02-13 DIAGNOSIS — Z888 Allergy status to other drugs, medicaments and biological substances status: Secondary | ICD-10-CM

## 2022-02-13 DIAGNOSIS — I340 Nonrheumatic mitral (valve) insufficiency: Secondary | ICD-10-CM | POA: Diagnosis present

## 2022-02-13 DIAGNOSIS — Z8249 Family history of ischemic heart disease and other diseases of the circulatory system: Secondary | ICD-10-CM

## 2022-02-13 DIAGNOSIS — R8281 Pyuria: Secondary | ICD-10-CM | POA: Diagnosis present

## 2022-02-13 DIAGNOSIS — E860 Dehydration: Secondary | ICD-10-CM | POA: Diagnosis present

## 2022-02-13 DIAGNOSIS — R296 Repeated falls: Secondary | ICD-10-CM | POA: Diagnosis present

## 2022-02-13 DIAGNOSIS — N138 Other obstructive and reflux uropathy: Secondary | ICD-10-CM | POA: Diagnosis present

## 2022-02-13 DIAGNOSIS — I130 Hypertensive heart and chronic kidney disease with heart failure and stage 1 through stage 4 chronic kidney disease, or unspecified chronic kidney disease: Secondary | ICD-10-CM | POA: Diagnosis present

## 2022-02-13 DIAGNOSIS — K219 Gastro-esophageal reflux disease without esophagitis: Secondary | ICD-10-CM | POA: Diagnosis present

## 2022-02-13 DIAGNOSIS — I10 Essential (primary) hypertension: Secondary | ICD-10-CM | POA: Diagnosis present

## 2022-02-13 DIAGNOSIS — G319 Degenerative disease of nervous system, unspecified: Secondary | ICD-10-CM

## 2022-02-13 DIAGNOSIS — Z981 Arthrodesis status: Secondary | ICD-10-CM

## 2022-02-13 DIAGNOSIS — E1122 Type 2 diabetes mellitus with diabetic chronic kidney disease: Secondary | ICD-10-CM | POA: Diagnosis present

## 2022-02-13 DIAGNOSIS — N390 Urinary tract infection, site not specified: Secondary | ICD-10-CM | POA: Diagnosis present

## 2022-02-13 DIAGNOSIS — N1832 Chronic kidney disease, stage 3b: Secondary | ICD-10-CM | POA: Diagnosis present

## 2022-02-13 DIAGNOSIS — D631 Anemia in chronic kidney disease: Secondary | ICD-10-CM | POA: Diagnosis present

## 2022-02-13 DIAGNOSIS — N401 Enlarged prostate with lower urinary tract symptoms: Secondary | ICD-10-CM | POA: Diagnosis present

## 2022-02-13 DIAGNOSIS — Z7902 Long term (current) use of antithrombotics/antiplatelets: Secondary | ICD-10-CM

## 2022-02-13 DIAGNOSIS — D3613 Benign neoplasm of peripheral nerves and autonomic nervous system of lower limb, including hip: Secondary | ICD-10-CM | POA: Diagnosis present

## 2022-02-13 DIAGNOSIS — E1142 Type 2 diabetes mellitus with diabetic polyneuropathy: Secondary | ICD-10-CM | POA: Diagnosis present

## 2022-02-13 DIAGNOSIS — R8271 Bacteriuria: Secondary | ICD-10-CM | POA: Diagnosis present

## 2022-02-13 DIAGNOSIS — E119 Type 2 diabetes mellitus without complications: Secondary | ICD-10-CM

## 2022-02-13 DIAGNOSIS — I739 Peripheral vascular disease, unspecified: Secondary | ICD-10-CM | POA: Diagnosis not present

## 2022-02-13 DIAGNOSIS — D75839 Thrombocytosis, unspecified: Secondary | ICD-10-CM | POA: Diagnosis present

## 2022-02-13 DIAGNOSIS — H269 Unspecified cataract: Secondary | ICD-10-CM | POA: Diagnosis present

## 2022-02-13 DIAGNOSIS — Z8546 Personal history of malignant neoplasm of prostate: Secondary | ICD-10-CM

## 2022-02-13 DIAGNOSIS — J449 Chronic obstructive pulmonary disease, unspecified: Secondary | ICD-10-CM | POA: Diagnosis present

## 2022-02-13 DIAGNOSIS — I700 Atherosclerosis of aorta: Secondary | ICD-10-CM | POA: Diagnosis present

## 2022-02-13 DIAGNOSIS — D500 Iron deficiency anemia secondary to blood loss (chronic): Secondary | ICD-10-CM | POA: Diagnosis present

## 2022-02-13 DIAGNOSIS — K5900 Constipation, unspecified: Secondary | ICD-10-CM | POA: Diagnosis present

## 2022-02-13 DIAGNOSIS — N179 Acute kidney failure, unspecified: Secondary | ICD-10-CM | POA: Diagnosis present

## 2022-02-13 DIAGNOSIS — E1152 Type 2 diabetes mellitus with diabetic peripheral angiopathy with gangrene: Secondary | ICD-10-CM | POA: Diagnosis present

## 2022-02-13 DIAGNOSIS — I2510 Atherosclerotic heart disease of native coronary artery without angina pectoris: Secondary | ICD-10-CM | POA: Diagnosis present

## 2022-02-13 DIAGNOSIS — Z20822 Contact with and (suspected) exposure to covid-19: Secondary | ICD-10-CM | POA: Diagnosis present

## 2022-02-13 DIAGNOSIS — F17210 Nicotine dependence, cigarettes, uncomplicated: Secondary | ICD-10-CM | POA: Diagnosis present

## 2022-02-13 DIAGNOSIS — E782 Mixed hyperlipidemia: Secondary | ICD-10-CM | POA: Diagnosis present

## 2022-02-13 DIAGNOSIS — I5022 Chronic systolic (congestive) heart failure: Secondary | ICD-10-CM | POA: Diagnosis present

## 2022-02-13 DIAGNOSIS — Z833 Family history of diabetes mellitus: Secondary | ICD-10-CM

## 2022-02-13 DIAGNOSIS — I96 Gangrene, not elsewhere classified: Secondary | ICD-10-CM | POA: Diagnosis not present

## 2022-02-13 DIAGNOSIS — H409 Unspecified glaucoma: Secondary | ICD-10-CM | POA: Diagnosis present

## 2022-02-13 DIAGNOSIS — E7800 Pure hypercholesterolemia, unspecified: Secondary | ICD-10-CM | POA: Diagnosis present

## 2022-02-13 DIAGNOSIS — F17200 Nicotine dependence, unspecified, uncomplicated: Secondary | ICD-10-CM | POA: Diagnosis present

## 2022-02-13 DIAGNOSIS — N3000 Acute cystitis without hematuria: Secondary | ICD-10-CM | POA: Diagnosis not present

## 2022-02-13 DIAGNOSIS — Z79899 Other long term (current) drug therapy: Secondary | ICD-10-CM

## 2022-02-13 DIAGNOSIS — R42 Dizziness and giddiness: Secondary | ICD-10-CM

## 2022-02-13 DIAGNOSIS — E1151 Type 2 diabetes mellitus with diabetic peripheral angiopathy without gangrene: Secondary | ICD-10-CM | POA: Diagnosis not present

## 2022-02-13 LAB — CBC WITH DIFFERENTIAL/PLATELET
Abs Immature Granulocytes: 0.08 10*3/uL — ABNORMAL HIGH (ref 0.00–0.07)
Basophils Absolute: 0.1 10*3/uL (ref 0.0–0.1)
Basophils Relative: 1 %
Eosinophils Absolute: 0.1 10*3/uL (ref 0.0–0.5)
Eosinophils Relative: 1 %
HCT: 26.5 % — ABNORMAL LOW (ref 39.0–52.0)
Hemoglobin: 8.1 g/dL — ABNORMAL LOW (ref 13.0–17.0)
Immature Granulocytes: 1 %
Lymphocytes Relative: 10 %
Lymphs Abs: 1.5 10*3/uL (ref 0.7–4.0)
MCH: 21.8 pg — ABNORMAL LOW (ref 26.0–34.0)
MCHC: 30.6 g/dL (ref 30.0–36.0)
MCV: 71.2 fL — ABNORMAL LOW (ref 80.0–100.0)
Monocytes Absolute: 1.1 10*3/uL — ABNORMAL HIGH (ref 0.1–1.0)
Monocytes Relative: 7 %
Neutro Abs: 12.3 10*3/uL — ABNORMAL HIGH (ref 1.7–7.7)
Neutrophils Relative %: 80 %
Platelets: 509 10*3/uL — ABNORMAL HIGH (ref 150–400)
RBC: 3.72 MIL/uL — ABNORMAL LOW (ref 4.22–5.81)
RDW: 22.8 % — ABNORMAL HIGH (ref 11.5–15.5)
WBC: 15.1 10*3/uL — ABNORMAL HIGH (ref 4.0–10.5)
nRBC: 0 % (ref 0.0–0.2)

## 2022-02-13 LAB — URINALYSIS, ROUTINE W REFLEX MICROSCOPIC
Bacteria, UA: NONE SEEN
Bilirubin Urine: NEGATIVE
Glucose, UA: NEGATIVE mg/dL
Hgb urine dipstick: NEGATIVE
Ketones, ur: NEGATIVE mg/dL
Nitrite: NEGATIVE
Protein, ur: NEGATIVE mg/dL
Specific Gravity, Urine: 1.014 (ref 1.005–1.030)
pH: 5 (ref 5.0–8.0)

## 2022-02-13 LAB — RESP PANEL BY RT-PCR (FLU A&B, COVID) ARPGX2
Influenza A by PCR: NEGATIVE
Influenza B by PCR: NEGATIVE
SARS Coronavirus 2 by RT PCR: NEGATIVE

## 2022-02-13 LAB — COMPREHENSIVE METABOLIC PANEL
ALT: 16 U/L (ref 0–44)
AST: 11 U/L — ABNORMAL LOW (ref 15–41)
Albumin: 4.1 g/dL (ref 3.5–5.0)
Alkaline Phosphatase: 47 U/L (ref 38–126)
Anion gap: 12 (ref 5–15)
BUN: 51 mg/dL — ABNORMAL HIGH (ref 8–23)
CO2: 23 mmol/L (ref 22–32)
Calcium: 9.5 mg/dL (ref 8.9–10.3)
Chloride: 99 mmol/L (ref 98–111)
Creatinine, Ser: 2.25 mg/dL — ABNORMAL HIGH (ref 0.61–1.24)
GFR, Estimated: 29 mL/min — ABNORMAL LOW (ref >60)
Glucose, Bld: 123 mg/dL — ABNORMAL HIGH (ref 70–99)
Potassium: 3.9 mmol/L (ref 3.5–5.1)
Sodium: 134 mmol/L — ABNORMAL LOW (ref 135–145)
Total Bilirubin: 0.4 mg/dL (ref 0.3–1.2)
Total Protein: 7.1 g/dL (ref 6.5–8.1)

## 2022-02-13 LAB — TROPONIN I (HIGH SENSITIVITY)
Troponin I (High Sensitivity): 7 ng/L (ref <18)
Troponin I (High Sensitivity): 6 ng/L (ref <18)

## 2022-02-13 LAB — LIPASE, BLOOD: Lipase: 45 U/L (ref 11–51)

## 2022-02-13 MED ORDER — MIRABEGRON ER 25 MG PO TB24
25.0000 mg | ORAL_TABLET | Freq: Every day | ORAL | Status: DC
  Administered 2022-02-14 – 2022-02-16 (×3): 25 mg via ORAL
  Filled 2022-02-13 (×3): qty 1

## 2022-02-13 MED ORDER — BRIMONIDINE TARTRATE 0.2 % OP SOLN
1.0000 [drp] | Freq: Three times a day (TID) | OPHTHALMIC | Status: DC
  Administered 2022-02-15 – 2022-02-16 (×2): 1 [drp] via OPHTHALMIC
  Filled 2022-02-13: qty 5

## 2022-02-13 MED ORDER — BISACODYL 5 MG PO TBEC: 5.0000 mg | DELAYED_RELEASE_TABLET | Freq: Every day | ORAL | Status: DC

## 2022-02-13 MED ORDER — LACTATED RINGERS IV BOLUS
1000.0000 mL | Freq: Once | INTRAVENOUS | Status: AC
  Administered 2022-02-13: 1000 mL via INTRAVENOUS

## 2022-02-13 MED ORDER — NICOTINE 21 MG/24HR TD PT24: 21.0000 mg | MEDICATED_PATCH | Freq: Every day | TRANSDERMAL | Status: DC | PRN

## 2022-02-13 MED ORDER — BRIMONIDINE TARTRATE 0.2 % OP SOLN: 1.0000 [drp] | Freq: Three times a day (TID) | OPHTHALMIC | Status: DC

## 2022-02-13 MED ORDER — PRAVASTATIN SODIUM 40 MG PO TABS
80.0000 mg | ORAL_TABLET | Freq: Every day | ORAL | Status: DC
  Administered 2022-02-14 – 2022-02-16 (×3): 80 mg via ORAL
  Filled 2022-02-13 (×3): qty 2

## 2022-02-13 MED ORDER — BRINZOLAMIDE 1 % OP SUSP
1.0000 [drp] | Freq: Three times a day (TID) | OPHTHALMIC | Status: DC
  Administered 2022-02-15 – 2022-02-16 (×2): 1 [drp] via OPHTHALMIC
  Filled 2022-02-13: qty 10

## 2022-02-13 MED ORDER — GABAPENTIN 300 MG PO CAPS
300.0000 mg | ORAL_CAPSULE | Freq: Every day | ORAL | Status: DC
  Administered 2022-02-14 – 2022-02-16 (×3): 300 mg via ORAL
  Filled 2022-02-13 (×3): qty 1

## 2022-02-13 MED ORDER — SENNOSIDES-DOCUSATE SODIUM 8.6-50 MG PO TABS
1.0000 | ORAL_TABLET | Freq: Every day | ORAL | Status: DC
  Administered 2022-02-14 – 2022-02-15 (×2): 1 via ORAL
  Filled 2022-02-13 (×2): qty 1

## 2022-02-13 MED ORDER — POLYETHYLENE GLYCOL 3350 17 G PO PACK
17.0000 g | PACK | Freq: Two times a day (BID) | ORAL | Status: DC
  Administered 2022-02-14 (×2): 17 g via ORAL
  Filled 2022-02-13 (×2): qty 1

## 2022-02-13 MED ORDER — FOSFOMYCIN TROMETHAMINE 3 G PO PACK
3.0000 g | PACK | Freq: Once | ORAL | Status: AC
  Administered 2022-02-14: 3 g via ORAL
  Filled 2022-02-13 (×2): qty 3

## 2022-02-13 MED ORDER — CLOPIDOGREL BISULFATE 75 MG PO TABS: 75.0000 mg | ORAL_TABLET | Freq: Every day | ORAL | Status: DC

## 2022-02-13 MED ORDER — ACETAMINOPHEN 325 MG PO TABS: 650.0000 mg | ORAL_TABLET | Freq: Four times a day (QID) | ORAL | Status: DC | PRN

## 2022-02-13 MED ORDER — PANTOPRAZOLE SODIUM 40 MG PO TBEC
40.0000 mg | DELAYED_RELEASE_TABLET | Freq: Every day | ORAL | Status: DC
  Administered 2022-02-14 – 2022-02-16 (×3): 40 mg via ORAL
  Filled 2022-02-13 (×3): qty 1

## 2022-02-13 MED ORDER — LATANOPROST 0.005 % OP SOLN
1.0000 [drp] | Freq: Every day | OPHTHALMIC | Status: DC
  Administered 2022-02-14 – 2022-02-15 (×2): 1 [drp] via OPHTHALMIC
  Filled 2022-02-13 (×2): qty 2.5

## 2022-02-13 NOTE — ED Provider Notes (Signed)
Care of the patient assumed at the change of shift pending CT and lab results. Here for general weakness, syncope and low BP. Similar symptoms when admitted earlier this month.  Physical Exam  BP (!) 118/53    Pulse 73    Temp 97.8 F (36.6 C) (Oral)    Resp 13    SpO2 100%   Physical Exam Awake and alert No distress Resp Even and unlabored Generally weak  Procedures  Procedures  ED Course / MDM   Clinical Course as of 02/13/22 2135  Fri Feb 13, 2022  1728 I personally viewed the images from radiology studies and agree with radiologist interpretation: No acute findings.   [CS]  20 CMP with AKI compared to discharge 10 days ago. CBC with leukocytosis, likewise worsened from discharge.  [CS]  1829 Repeat Trop is neg.  UA is concerning for UTI, he has had some frequency. Will begin Rocephin. Discuss with [place] who was his admitting team last week.  [CS]  48 Spoke with [place] resident who will evaluate for admission.  [CS]    Clinical Course User Index [CS] Truddie Hidden, MD   Medical Decision Making Problems Addressed: Acute cystitis without hematuria: acute illness or injury AKI (acute kidney injury) [facility]): acute illness or injury Syncope, unspecified syncope type: acute illness or injury  Amount and/or Complexity of Data Reviewed Labs: ordered. Decision-making details documented in ED Course. Radiology: ordered and independent interpretation performed. Decision-making details documented in ED Course. ECG/medicine tests: ordered and independent interpretation performed. Decision-making details documented in ED Course.  Risk Decision regarding hospitalization.          Truddie Hidden, MD 02/13/22 2135

## 2022-02-13 NOTE — ED Provider Notes (Signed)
[facility] EMERGENCY DEPARTMENT Provider Note  CSN: [id number] Arrival date & time: 02/13/22 1353  Chief Complaint(s) Loss of Consciousness  HPI Russell [name] is a 81 y.o. male with PMH frequent falls and subdural hematomas, alcohol use disorder, HTN, PAD, COPD, recent admission on 02/01/2022 and discharged on 02/03/2022 for orthostatic syncope who presents the emergency department for evaluation of a syncope.  Patient states that she has had worsening abdominal pain over the last 3 to 4 days and has not had a bowel movement in 10 days.  States that he has subsequently taken decreased amount of p.o. and was walking in his back porch today when he had a syncopal episode striking his head on the ground.  Patient is on Plavix.  He arrives with complaints of generalized abdominal pain but denies numbness, tingling, weakness or other neurologic or systemic complaints.  Denies chest pain or shortness of breath.   Loss of Consciousness  Past Medical History Past Medical History:  Diagnosis Date   AKI (acute kidney injury) ([place]) 06/23/2017   Arthritis    SHOUDLERS, LUMBAR   BPH with obstruction/lower urinary tract symptoms    BPH with urinary obstruction 01/15/2016   COPD (chronic obstructive pulmonary disease) ([place])    Coronary atherosclerosis    denied knowledge of this 10/27/16   Depression    Diverticulosis of colon    Dyspnea    ED (erectile dysfunction)    Full dentures    GERD (gastroesophageal reflux disease)    not current   Glaucoma    History of gastritis    History of scabies    2008   History of seizure    2013-- x2   idiopathic --  none since   History of syncope    03-22-2014  DX  VAGAL RESPONSE   Hyperlipidemia, mixed    Hypertension    Mitral regurgitation    Peripheral arterial disease ([place])    one vessel runoff bilaterally via peroneal arteries   Prostate cancer ([place])    Seizures ([place]) 03/2014   ? 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   Acute on chronic intracranial subdural hematoma ([place]) 09/15/2021   CKD (chronic kidney disease) stage 3, GFR 30-59 ml/min (HCC) - baseline SCr 2.0 09/15/2021   Cognitive communication deficit 07/09/2021   Intracranial bleed ([place]) 07/07/2021   Fall at home, initial encounter    Traumatic hemorrhage of cerebrum without loss of consciousness ([place])    Osteoarthritis 05/31/2018   Foot pain, left 05/11/2018   Chest pain 06/23/2017   AKI (acute kidney injury) ([place]) 06/23/2017   Surgery, elective    Peripheral neuropathy 01/27/2017   Cervical radiculopathy 01/27/2017   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   Peripheral arterial disease ([place]) 01/23/2016   DM type 2 (diabetes mellitus, type 2) ([place]) 02/22/2015   Anemia 02/22/2015   Neuroma digital nerve 09/28/2013   Neuroma, 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   SEIZURE, 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   BENIGN PROSTATIC HYPERTROPHY, WITH URINARY OBSTRUCTION 06/13/2010   ARM PAIN 06/03/2010   DYSPNEA ON EXERTION 05/15/2010   TOBACCO ABUSE 04/03/2010   COPD 04/03/2010   G E R D 04/03/2010   OTHER  DYSPHAGIA 04/03/2010   DIABETES MELLITUS, TYPE II, CONTROLLED 01/14/2010   LUMBAR RADICULOPATHY, LEFT 05/16/2009   DYSPHAGIA UNSPECIFIED 05/16/2009   COUGH DUE TO ACE INHIBITORS 04/02/2009   DIZZINESS 07/27/2008   HYPERCHOLESTEROLEMIA, MIXED 05/11/2008   CATARACTS, BILATERAL 03/01/2008   MILD SPINAL STENOSIS, CERVICAL 01/12/2008   LEG CRAMPS 01/12/2008   EARLY SATIETY 11/04/2007   DIVERTICULOSIS, COLON 08/31/2007   SCABIES 08/26/2007   ERECTILE DYSFUNCTION 08/18/2007   Alcohol abuse 08/18/2007   Essential hypertension 08/17/2007   HEMOCCULT POSITIVE STOOL 07/05/2007   ANEMIA, IRON  DEFICIENCY NOS 05/31/2007   WEIGHT LOSS, ABNORMAL 05/10/2006   Home Medication(s) Prior to Admission medications   Medication Sig Start Date End Date Taking? Authorizing Provider  acetaminophen (TYLENOL) 325 MG tablet Take 2 tablets (650 mg total) by mouth every 4 (four) hours as needed for mild pain (or temp > 37.5 C (99.5 F)). 07/10/21   Samuella Cota, MD  bimatoprost (LUMIGAN) 0.01 % SOLN Place 1 drop into both eyes at bedtime. 04/10/13   Dhungel, Nishant, MD  brimonidine (ALPHAGAN) 0.2 % ophthalmic solution Place 1 drop into both eyes 3 (three) times daily. 03/14/19   [provider]  Brinzolamide-Brimonidine (SIMBRINZA) 1-0.2 % SUSP Place 1 drop into both eyes 3 (three) times daily.    [provider]  clopidogrel (PLAVIX) 75 MG tablet Take 75 mg by mouth daily. 09/05/21   [provider]  ferrous sulfate 325 (65 FE) MG EC tablet Take 325 mg by mouth daily.    [provider]  gabapentin (NEURONTIN) 300 MG capsule Take 300 mg by mouth daily. 02/25/21   [provider]  Multiple Vitamin (MULTIVITAMIN WITH MINERALS) TABS tablet Take 1 tablet by mouth daily. One a day    [provider]  MYRBETRIQ 25 MG TB24 tablet Take 25 mg by mouth daily. 02/12/20   [provider]  oxyCODONE-acetaminophen (PERCOCET) 10-325 MG tablet Take 1 tablet by mouth every 8 (eight) hours as needed for up to 7 days for pain. 02/10/22 02/17/22  Hyatt, Max T, DPM  pantoprazole (PROTONIX) 40 MG tablet Take 1 tablet (40 mg total) by mouth daily. 02/04/22 03/06/22  Gerrit Heck, MD  pravastatin (PRAVACHOL) 80 MG tablet Take 80 mg by mouth daily.  10/19/16   [provider]                                                                                                                                    Past Surgical History Past Surgical History:  Procedure Laterality Date   ABDOMINAL AORTOGRAM W/LOWER EXTREMITY Left 12/18/2020   Procedure: ABDOMINAL  AORTOGRAM W/LOWER EXTREMITY;  Surgeon: Cherre Robins, MD;  Location: [place] [facility];  Service: Cardiovascular;  Laterality: Left;   ABDOMINAL AORTOGRAM W/LOWER EXTREMITY N/A 02/26/2021   Procedure: ABDOMINAL AORTOGRAM W/LOWER EXTREMITY;  Surgeon: Cherre Robins, MD;  Location: [place] [facility];  Service: Cardiovascular;  Laterality: N/A;   ANTERIOR CERVICAL DECOMP/DISCECTOMY FUSION  10-16-2009   C4 -- C6   ANTERIOR CERVICAL DECOMP/DISCECTOMY FUSION N/A 01/27/2017   Procedure: ANTERIOR CERVICAL DECOMPRESSION FUSION CERVICAL 3-4 WITH INSTRUMENTATION AND ALLOGRAFT;  Surgeon: Phylliss Bob, MD;  Location: [place];  Service: Orthopedics;  Laterality: N/A;  ANTERIOR CERVICAL DECOMPRESSION FUSION CERVICAL 3-4 WITH INSTRUMENTATION AND ALLOGRAFT   CAPSULOTOMY Right 01/26/2013   Procedure: MINOR CAPSULOTOMY;  Surgeon: Myrtha Mantis., MD;  Location: [place];  Service: Ophthalmology;  Laterality: Right;   CARPAL TUNNEL RELEASE Left 01/02/2019   Procedure: LEFT CARPAL TUNNEL RELEASE;  Surgeon: Leanora Cover, MD;  Location: [place];  Service: Orthopedics;  Laterality: Left;   CARPECTOMY Right 03/13/2014   Procedure: RIGHT  PROXIMAL ROW CARPECTOMY;  Surgeon: Tennis Must, MD;  Location: [place];  Service: Orthopedics;  Laterality: Right;   CARPECTOMY Left 10/22/2015   Procedure: LEFT WRIST PROXIMAL ROW CARPECTOMY ;  Surgeon: Leanora Cover, MD;  Location: [place];  Service: Orthopedics;  Laterality: Left;   CARPOMETACARPAL ([place]) FUSION OF THUMB Right 03/13/2014   Procedure: RIGHT FUSION OF THUMB CARPOMETACARPAL [facility]) JOINT;  Surgeon: Tennis Must, MD;  Location: [place];  Service: Orthopedics;  Laterality: Right;   CARPOMETACARPEL SUSPENSION PLASTY Left 04/04/2020   Procedure: LEFT THUMB TRAPECIECTOMY AND SUSPENSIONPLASTY;  Surgeon: Leanora Cover, MD;  Location: [place];  Service: Orthopedics;  Laterality: Left;    CATARACT EXTRACTION W/ INTRAOCULAR LENS  IMPLANT, BILATERAL  2009   COLONOSCOPY  08-31-2007   COLONOSCOPY     CRYOABLATION N/A 01/14/2015   Procedure: CRYO ABLATION PROSTATE;  Surgeon: Ailene Rud, MD;  Location: [facility];  Service: Urology;  Laterality: N/A;   ESOPHAGOGASTRODUODENOSCOPY  last one 09-11-2008   EXCISION MASS LEFT FACE , SUBORBITAL AREA, PLASTER RECONSTRUCTION  02-09-2011   EXCISIONAL DEBRIDEMENT AND REPAIR RIGHT QUADRICEP TENDON  07-05-2000   FOOT SURGERY Left    I & D EXTREMITY Right 05/24/2013   Procedure: RIGHT INDEX WOUND DEBRIDEMENT AND CLOSURE;  Surgeon: Jolyn Nap, MD;  Location: [place];  Service: Orthopedics;  Laterality: Right;   INSERTION OF SUPRAPUBIC CATHETER N/A 01/14/2015   Procedure: SUPRAPUBIC TUBE PLACEMENT;  Surgeon: Ailene Rud, MD;  Location: [facility];  Service: Urology;  Laterality: N/A;   KNEE ARTHROSCOPY Right 2008   LARYNGOSCOPY AND ESOPHAGOSCOPY  06-13-2010   MARSUPIALIZATION LEFT LARGE VALLECULAR CYST   MASS EXCISION Left 10/22/2015   Procedure: EXCISION MASS OF LEFT INDEX FINGER;  Surgeon: Leanora Cover, MD;  Location: [place];  Service: Orthopedics;  Laterality: Left;   ORCHIECTOMY Left 02/24/2015   Procedure: SCROTAL EXPLORATION WITH LEFT ORCHIECTOMY;  Surgeon: Kathie Rhodes, MD;  Location: [place];  Service: Urology;  Laterality: Left;   PERIPHERAL VASCULAR INTERVENTION Left 02/26/2021   Procedure: PERIPHERAL VASCULAR INTERVENTION;  Surgeon: Cherre Robins, MD;  Location: [place] [facility];  Service: Cardiovascular;  Laterality: Left;  Superficial femoral   POSTERIOR CERVICAL FUSION/FORAMINOTOMY N/A 01/28/2017   Procedure: POSTERIOR SPINAL FUSION CERVICAL 3-4, CERVICAL 4-5, CERVICAL 5-6, CERVICAL 6-7 WITH INSTRUMENATION AND ALLOGRAFT;  Surgeon: Phylliss Bob, MD;  Location: [place];  Service: Orthopedics;  Laterality: N/A;  POSTERIOR SPINAL FUSION CERVICAL 3-4,  CERVICAL 4-5, CERVICAL 5-6, CERVICAL 6-7 WITH INSTRUMENATION AND ALLOGRAFT   SHOULDER ARTHROSCOPY/ DEBRIDEMENT LABRAL TEAR/  BICEPS TENOTOMY Left 09-24-2011   TONSILLECTOMY  as child   TRANSTHORACIC ECHOCARDIOGRAM  02-21-2010   normal LVF/  ef 55-60%/ mild to moderate MR/  moderate LAE/  mild TR   YAG LASER APPLICATION Right 0/0/1749   Procedure: YAG LASER APPLICATION;  Surgeon: Myrtha Mantis., MD;  Location: [facility];  Service: Ophthalmology;  Laterality: Right;   YAG LASER CAPSULOTOMY, LEFT EYE  01-08-2011   Family History Family History  Problem Relation Age of Onset   Unexplained death Mother        died at a young age, no known cause   Heart attack Father        age unknown   Diabetes Other    Cancer Other     Social History Social History   Tobacco Use   Smoking status: Every Day    Packs/day: 1.00    Years: 59.00    Pack years: 59.00    Types: Cigarettes   Smokeless tobacco: Never   Tobacco comments:    Started Chantix 10/26/16  Vaping Use   Vaping Use: Never used  Substance Use Topics   Alcohol use: No    Comment: none since approx 2014- heavy before   Drug use: No   Allergies Tramadol  Review of Systems Review of Systems  Cardiovascular:  Positive for syncope.  Gastrointestinal:  Positive for abdominal pain and constipation.  Neurological:  Positive for syncope.   Physical Exam Vital Signs  I have reviewed the triage vital signs BP 101/60 (BP Location: Right Arm)    Pulse (!) 57    Temp 97.8 F (36.6 C) (Oral)    Resp 15    SpO2 100%   Physical Exam Vitals and nursing note reviewed.  Constitutional:      General: He is not in acute distress.    Appearance: He is well-developed.  HENT:     Head: Normocephalic and atraumatic.  Eyes:     Conjunctiva/sclera: Conjunctivae normal.  Cardiovascular:     Rate and Rhythm: Normal rate and regular rhythm.     Heart sounds: No murmur heard. Pulmonary:     Effort: Pulmonary effort is normal. No  respiratory distress.     Breath sounds: Normal breath sounds.  Abdominal:     Palpations: Abdomen is soft.     Tenderness: There is abdominal tenderness.  Musculoskeletal:        General: No swelling.     Cervical back: Neck supple.  Skin:    General: Skin is warm and dry.     Capillary Refill: Capillary refill takes less than 2 seconds.  Neurological:     Mental Status: He is alert.  Psychiatric:        Mood and Affect: Mood normal.    ED Results and Treatments Labs (all labs ordered are listed, but only abnormal results are displayed) Labs Reviewed  COMPREHENSIVE METABOLIC PANEL  CBC WITH DIFFERENTIAL/PLATELET  TROPONIN I (HIGH SENSITIVITY)  Radiology No results found.  Pertinent labs & imaging results that were available during my care of the patient were reviewed by me and considered in my medical decision making (see MDM for details).  Medications Ordered in ED Medications  lactated ringers bolus 1,000 mL (has no administration in time range)                                                                                                                                     Procedures .Critical Care Performed by: Teressa Lower, MD Authorized by: Teressa Lower, MD   Critical care provider statement:    Critical care time (minutes):  30   Critical care was necessary to treat or prevent imminent or life-threatening deterioration of the following conditions:  Dehydration   Critical care was time spent personally by me on the following activities:  Development of treatment plan with patient or surrogate, discussions with consultants, evaluation of patient's response to treatment, examination of patient, ordering and review of laboratory studies, ordering and review of radiographic studies, ordering and performing treatments and interventions, pulse  oximetry, re-evaluation of patient's condition and review of old charts  (including critical care time)  Medical Decision Making / ED Course   This patient presents to the ED for concern of syncope, abdominal pain, this involves an extensive number of treatment options, and is a complaint that carries with it a high risk of complications and morbidity.  The differential diagnosis includes orthostatic syncope, hypovolemia, constipation, intra-abdominal infection, ICH  MDM: Patient seen emergency department for evaluation of abdominal pain and syncope.  Physical exam reveals abdominal distention and generalized abdominal tenderness but is otherwise unremarkable.  Laboratory evaluation with a significant leukocytosis to 15.1, hemoglobin 8.1, thrombocytosis to 509 which may be secondary to hemoconcentration.  CMP with BUN 51, creatinine 2.25.  Positive troponin negative, lipase negative.  CT imaging pending at time of signout.  Fluid resuscitation begun and patient signed out to oncoming provider.  Please see provider signout for continuation of work-up.   Additional history obtained:  -External records from outside source obtained and reviewed including: Chart review including previous notes, labs, imaging, consultation notes   Lab Tests: -I ordered, reviewed, and interpreted labs.   The pertinent results include:   Labs Reviewed  COMPREHENSIVE METABOLIC PANEL  CBC WITH DIFFERENTIAL/PLATELET  TROPONIN I (HIGH SENSITIVITY)      EKG   EKG Interpretation  Date/Time:  Friday February 13 2022 14:04:49 EST Ventricular Rate:  64 PR Interval:  183 QRS Duration: 104 QT Interval:  452 QTC Calculation: 467 R Axis:   28 Text Interpretation: Sinus rhythm Multiform ventricular premature complexes Nonspecific T abnrm, anterolateral leads Confirmed by Sherelle Castelli ([id number]) on 02/13/2022 2:36:45 PM         Imaging Studies ordered: I ordered imaging studies including CTH, CT C spine I  independently visualized and interpreted imaging. I agree with the radiologist interpretation  CT AP pending   Medicines ordered and prescription drug management: Meds ordered this encounter  Medications   lactated ringers bolus 1,000 mL    -I have reviewed the patients home medicines and have made adjustments as needed  Critical interventions Fluid resuscitation    Cardiac Monitoring: The patient was maintained on a cardiac monitor.  I personally viewed and interpreted the cardiac monitored which showed an underlying rhythm of: NSR  Social Determinants of Health:  Factors impacting patients care include: none   Reevaluation: After the interventions noted above, I reevaluated the patient and found that they have :improved  Co morbidities that complicate the patient evaluation  Past Medical History:  Diagnosis Date   AKI (acute kidney injury) ([place]) 06/23/2017   Arthritis    SHOUDLERS, LUMBAR   BPH with obstruction/lower urinary tract symptoms    BPH with urinary obstruction 01/15/2016   COPD (chronic obstructive pulmonary disease) ([place])    Coronary atherosclerosis    denied knowledge of this 10/27/16   Depression    Diverticulosis of colon    Dyspnea    ED (erectile dysfunction)    Full dentures    GERD (gastroesophageal reflux disease)    not current   Glaucoma    History of gastritis    History of scabies    2008   History of seizure    2013-- x2   idiopathic --  none since   History of syncope    03-22-2014  DX  VAGAL RESPONSE   Hyperlipidemia, mixed    Hypertension    Mitral regurgitation    Peripheral arterial disease ([place])    one vessel runoff bilaterally via peroneal arteries   Prostate cancer ([place])    Seizures ([place]) 03/2014   ? or syncope- patient refused to be admitted for further studies- "felt better"   Type 2 diabetes, diet controlled ([place])    patient and his wife said no- have not been told 01/21/17   Wears glasses       Dispostion: I  considered admission for this patient, and disposition pending imaging and reevaluation.  Please see provider signout note for disposition     Final Clinical Impression(s) / ED Diagnoses Final diagnoses:  None     @PCDICTATION @    Teressa Lower, MD 02/13/22 1623

## 2022-02-13 NOTE — Hospital Course (Addendum)
Russell [name] is a 81 y.o. male presenting after a fall. PMH is significant for DM type II, subdural hematomas with frequent falls, history of alcohol use disorder, hypertension, PAD, COPD, CKD 3B.  Syncopal episode, likely secondary to orthostasis and decreased p.o. intake Patient admitted after syncopal episode with possible head injury, found to by hypotensive by EMS and patient reported decreased p.o. intake for 3 days. CT head and cervical spine negative.  Patient normotensive prior to discharge.  Iron deficiency anemia Patient received IV Venofer and had a total of 2 blood transfusions with 1 unit pRBCs during his stay.  Patient has transfusion threshold of 8 and had hemoglobin of 8.9 on day of discharge.  Home Plavix for PAD was held due to possibility of bleeding and was also held at discharge.  UTI Patient found to have signs of UTI on urinalysis. Patient was treated with fosfomycin 3g once.   AKI on CKD 3B Creatinine elevated to 2.25 on admission, likely multifactorial with decreased intake and hypotensive episodes. Patient received 2L fluid boluses in the ER and creatinine was down to 1.5 the day before discharge.   Constipation Reported 10 days without bowel movement. CT abdomen/pelvis with no acute findings. Aggressive bowel regimen was initiated on admission with subsequent bowel movements.    All other chronic conditions were stable and home medications were continued.    Issues for follow-up: Patient may benefit from hem/onc outpatient.  Plavix held on discharge for risk of bleeding given history of anemia secondary to IDA. Sent home on oral iron every other day.  Repeat CBC at hospital follow up.  Held blood pressure medications given hypotensive episodes on admission. These meds were also held on discharge, recheck BP and restart as appropriate.

## 2022-02-13 NOTE — ED Triage Notes (Signed)
Pt arrives via EMS from home, patient reports feeling dizzy when he walked out on his back patio. Passed out. Had nausea/vomiting right after. Pt reports similar episode 1 week ago. Pt currently alert, oriented. Reports blood thinner but unknown what type. PT reports neck and abdominal pain. 20g LFA. Decreased PO intake. Bp on scene 76/42 then 90/50 after 363ml NS. C-collar in place.

## 2022-02-13 NOTE — H&P (Addendum)
[facility] Admission History and Physical Service Pager: [phone]  Patient name: Russell [name] Medical record number: [id number] Date of birth: 1941/11/17 Age: 81 y.o. Gender: male  Primary Care Provider: Marcie Mowers, FNP Consultants: None Code Status: Full Preferred Emergency Contact:   Name Relation Home Work [place] Spouse [phone]  [phone]    Chief Complaint: Fall  Assessment and Plan: Russell [name] is a 81 y.o. male presenting after a fall. PMH is significant for DM type II, subdural hematomas with frequent falls, history of alcohol use disorder, hypertension, PAD, COPD, CKD 3B.  Possible syncopal episode with fall, likely secondary to orthostasis Patient had possible syncopal episode earlier today resulting in a fall.  There is discrepancy with history taking, patient states he did not hit his head however ED note states he did strike his head on the ground.  Patient has history of frequent falls and was last admitted on 02/01/2022 after a presyncopal episode resulting in a fall, likely due to orthostasis.  On arrival to ED, patient was hypotensive to 101/60.  Vital signs otherwise stable.  CT head and cervical spine without acute findings. Labs significant for elevated creatinine, leukocytosis, and anemia. This is unlikely due to to a cardiac etiology as patient denies chest pain, EKG showed no evidence of MI and troponin of 7. Patient reports baseline dyspnea on exertion. Last echo 02/02/2022 showed LV EF of 45 to 50% with global hypokinesis.  Syncopal episode with fall is likely due to a combination of orthostasis and decreased p.o. intake for several days.  - Admit to med telemetry, attending Dr. McDiarmid - Cardiac monitoring - PT/OT eval and treat - A.m. CBC and BMP - Vitals per routine - OOB with assistance  - Consider orthostatic vitals  UTI Patient admits to abdominal pain, UA showed large leukocytes  with 21-50 WBCs.  Patient had WBC elevated to 15.1.  Will treat due to patient being male. - Fosfomycin 3 g once - Monitor UOP  AKI on CKD 3B Creatinine today of 2.25.  Baseline creatinine appears to be 1.6-1.9.  This is likely multifactorial d/t decreased p.o. intake, UTI, and hypotensive episodes.  Patient received LR bolus 1 L x 2 in the ED. - A.m. BMP - Consider additional IVF if needed - Bladder scan to ensure no retention - Strict Is&Os  Constipation Patient has not had a bowel movement in 10 days and complains of worsening abdominal pain. CT abdomen pelvis showed no acute findings, colonic diverticulosis, right liver cyst unchanged from previous imaging, bilateral renal cysts and aortic atherosclerosis.  Patient had lipase WNL in ED. Will initiate bowel regimen and monitor. - MiraLAX twice daily - Senna daily - Dulcolax daily  Hypotensive episodes in the setting of cHTN During last hospitalization patient's hypertensive medications were discontinued due to orthostasis.  Unsure if patient continued to take these medications.  Will have day team clarify..  Chronic iron deficiency anemia Patient has hemoglobin today of 8.1 which appears to be baseline.  Hemoglobin threshold of 8 given PAD.  Patient did require 1 blood transfusion during his last stay.  Patient takes iron supplement at home. - A.m. CBC  PAD Patient follows with Dr. Stanford Breed with vascular surgery.  Home medications include Plavix 75 mg and pravastatin 80 mg daily - Continue home medications  Neuroma of left foot Patient receives dehydrated alcohol injections with podiatry.  Patient does complain of pain related to this currently. - Tylenol 650 mg every  6 hours as needed - Monitor pain  BPH Home medication includes Myrbetriq 25 mg daily.  Patient previously took tamsulosin 0.4 mg daily but that was DC'd due to orthostasis. - Continue Myrbetriq - Bladder scans every 8  GERD - Continue home medication Protonix 40  mg daily  Tobacco use Patient smokes 1 pack/day - Nicotine patch 21 mg as needed  History of YAG capsulotomy - Continue home eyedrops of Lumigan, Alphagan, Simbrinza   FEN/GI: Regular diet Prophylaxis: SCDs  Disposition: Med telemetry  History of Present Illness:  Russell [name] is a 81 y.o. male presenting after a fall.  Fall today, "fell out" he was at the backdoor going outside. He was shutting the back door. He didn't hit his head, he fell on his hip.  Unsure how long he was down for.  Denies URI symptoms, fevers, chest pain, dysuria. Baseline intermittent shortness of breath with walking, dizziness before the fall.  Has some urinary incontinence. Hasn't had a bowel movement in 10 days. Hasn't eaten in 3 days due to low appetite   Tobacco use 1 ppd, wants nicotine patch. No alcohol or drug use  Review Of Systems: Per HPI with the following additions:   Review of Systems  Constitutional:  Positive for appetite change. Negative for fever.  HENT:  Negative for congestion, rhinorrhea and sore throat.   Respiratory:  Positive for shortness of breath.   Cardiovascular:  Negative for chest pain.  Gastrointestinal:  Positive for constipation.  Genitourinary:  Positive for difficulty urinating. Negative for dysuria.  Neurological:  Positive for dizziness.    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[place]) 09/15/2021   Acute on chronic intracranial subdural hematoma (HCC) 09/15/2021   CKD (chronic kidney disease) stage 3, GFR 30-59 ml/min (HCC) - baseline SCr 2.0 09/15/2021   Cognitive communication deficit 07/09/2021   Intracranial bleed ([place]) 07/07/2021   Fall at home, initial encounter    Traumatic hemorrhage of cerebrum without loss of consciousness ([place])    Osteoarthritis 05/31/2018   Foot pain, left 05/11/2018   Chest pain 06/23/2017   AKI (acute kidney injury) ([place]) 06/23/2017    Surgery, elective    Peripheral neuropathy 01/27/2017   Cervical radiculopathy 01/27/2017   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   Peripheral arterial disease ([place]) 01/23/2016   DM type 2 (diabetes mellitus, type 2) ([place]) 02/22/2015   Anemia 02/22/2015   Neuroma digital nerve 09/28/2013   Neuroma, 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   SEIZURE, 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   BENIGN PROSTATIC HYPERTROPHY, WITH URINARY OBSTRUCTION 06/13/2010   ARM PAIN 06/03/2010   DYSPNEA ON EXERTION 05/15/2010   TOBACCO ABUSE 04/03/2010   COPD 04/03/2010   G E R D 04/03/2010   OTHER DYSPHAGIA 04/03/2010   DIABETES MELLITUS, TYPE II, CONTROLLED 01/14/2010   LUMBAR RADICULOPATHY, LEFT 05/16/2009   DYSPHAGIA UNSPECIFIED 05/16/2009   COUGH DUE TO ACE INHIBITORS 04/02/2009   DIZZINESS 07/27/2008   HYPERCHOLESTEROLEMIA, MIXED 05/11/2008   CATARACTS, BILATERAL 03/01/2008   MILD SPINAL STENOSIS, CERVICAL 01/12/2008   LEG CRAMPS 01/12/2008   EARLY SATIETY 11/04/2007   DIVERTICULOSIS, COLON 08/31/2007   SCABIES 08/26/2007   ERECTILE DYSFUNCTION 08/18/2007   Alcohol abuse 08/18/2007   Essential hypertension 08/17/2007   HEMOCCULT POSITIVE STOOL 07/05/2007   ANEMIA, IRON DEFICIENCY NOS 05/31/2007   WEIGHT LOSS, ABNORMAL 05/10/2006    Past Medical History: Past Medical History:  Diagnosis Date   AKI (acute  kidney injury) ([place]) 06/23/2017   Arthritis    SHOUDLERS, LUMBAR   BPH with obstruction/lower urinary tract symptoms    BPH with urinary obstruction 01/15/2016   COPD (chronic obstructive pulmonary disease) ([place])    Coronary atherosclerosis    denied knowledge of this 10/27/16   Depression    Diverticulosis of colon    Dyspnea    ED (erectile dysfunction)    Full dentures    GERD (gastroesophageal reflux disease)    not current   Glaucoma    History of gastritis    History of scabies    2008   History of seizure    2013-- x2   idiopathic --  none since   History of syncope    03-22-2014  DX  VAGAL  RESPONSE   Hyperlipidemia, mixed    Hypertension    Mitral regurgitation    Peripheral arterial disease ([place])    one vessel runoff bilaterally via peroneal arteries   Prostate cancer ([place])    Seizures ([place]) 03/2014   ? or syncope- patient refused to be admitted for further studies- "felt better"   Type 2 diabetes, diet controlled ([place])    patient and his wife said no- have not been told 01/21/17   Wears glasses     Past Surgical History: Past Surgical History:  Procedure Laterality Date   ABDOMINAL AORTOGRAM W/LOWER EXTREMITY Left 12/18/2020   Procedure: ABDOMINAL AORTOGRAM W/LOWER EXTREMITY;  Surgeon: Cherre Robins, MD;  Location: [facility];  Service: Cardiovascular;  Laterality: Left;   ABDOMINAL AORTOGRAM W/LOWER EXTREMITY N/A 02/26/2021   Procedure: ABDOMINAL AORTOGRAM W/LOWER EXTREMITY;  Surgeon: Cherre Robins, MD;  Location: [facility];  Service: Cardiovascular;  Laterality: N/A;   ANTERIOR CERVICAL DECOMP/DISCECTOMY FUSION  10-16-2009   C4 -- C6   ANTERIOR CERVICAL DECOMP/DISCECTOMY FUSION N/A 01/27/2017   Procedure: ANTERIOR CERVICAL DECOMPRESSION FUSION CERVICAL 3-4 WITH INSTRUMENTATION AND ALLOGRAFT;  Surgeon: Phylliss Bob, MD;  Location: [facility];  Service: Orthopedics;  Laterality: N/A;  ANTERIOR CERVICAL DECOMPRESSION FUSION CERVICAL 3-4 WITH INSTRUMENTATION AND ALLOGRAFT   CAPSULOTOMY Right 01/26/2013   Procedure: MINOR CAPSULOTOMY;  Surgeon: Myrtha Mantis., MD;  Location: [facility];  Service: Ophthalmology;  Laterality: Right;   CARPAL TUNNEL RELEASE Left 01/02/2019   Procedure: LEFT CARPAL TUNNEL RELEASE;  Surgeon: Leanora Cover, MD;  Location: [facility];  Service: Orthopedics;  Laterality: Left;   CARPECTOMY Right 03/13/2014   Procedure: RIGHT  PROXIMAL ROW CARPECTOMY;  Surgeon: Tennis Must, MD;  Location: [facility];  Service: Orthopedics;  Laterality: Right;   CARPECTOMY Left 10/22/2015   Procedure: LEFT WRIST PROXIMAL  ROW CARPECTOMY ;  Surgeon: Leanora Cover, MD;  Location: [facility];  Service: Orthopedics;  Laterality: Left;   CARPOMETACARPAL ([place]) FUSION OF THUMB Right 03/13/2014   Procedure: RIGHT FUSION OF THUMB CARPOMETACARPAL [facility]) JOINT;  Surgeon: Tennis Must, MD;  Location: [facility];  Service: Orthopedics;  Laterality: Right;   CARPOMETACARPEL SUSPENSION PLASTY Left 04/04/2020   Procedure: LEFT THUMB TRAPECIECTOMY AND SUSPENSIONPLASTY;  Surgeon: Leanora Cover, MD;  Location: [facility];  Service: Orthopedics;  Laterality: Left;   CATARACT EXTRACTION W/ INTRAOCULAR LENS  IMPLANT, BILATERAL  2009   COLONOSCOPY  08-31-2007   COLONOSCOPY     CRYOABLATION N/A 01/14/2015   Procedure: CRYO ABLATION PROSTATE;  Surgeon: Ailene Rud, MD;  Location[facility];  Service: Urology;  Laterality: N/A;  ESOPHAGOGASTRODUODENOSCOPY  last one 09-11-2008   EXCISION MASS LEFT FACE , SUBORBITAL AREA, PLASTER RECONSTRUCTION  02-09-2011   EXCISIONAL DEBRIDEMENT AND REPAIR RIGHT QUADRICEP TENDON  07-05-2000   FOOT SURGERY Left    I & D EXTREMITY Right 05/24/2013   Procedure: RIGHT INDEX WOUND DEBRIDEMENT AND CLOSURE;  Surgeon: Jolyn Nap, MD;  Location: [facility];  Service: Orthopedics;  Laterality: Right;   INSERTION OF SUPRAPUBIC CATHETER N/A 01/14/2015   Procedure: SUPRAPUBIC TUBE PLACEMENT;  Surgeon: Ailene Rud, MD;  Location[facility];  Service: Urology;  Laterality: N/A;   KNEE ARTHROSCOPY Right 2008   LARYNGOSCOPY AND ESOPHAGOSCOPY  06-13-2010   MARSUPIALIZATION LEFT LARGE VALLECULAR CYST   MASS EXCISION Left 10/22/2015   Procedure: EXCISION MASS OF LEFT INDEX FINGER;  Surgeon: Leanora Cover, MD;  Location: [facility];  Service: Orthopedics;  Laterality: Left;   ORCHIECTOMY Left 02/24/2015   Procedure: SCROTAL EXPLORATION WITH LEFT ORCHIECTOMY;  Surgeon: Kathie Rhodes, MD;  Location: [place];   Service: Urology;  Laterality: Left;   PERIPHERAL VASCULAR INTERVENTION Left 02/26/2021   Procedure: PERIPHERAL VASCULAR INTERVENTION;  Surgeon: Cherre Robins, MD;  Location: [facility];  Service: Cardiovascular;  Laterality: Left;  Superficial femoral   POSTERIOR CERVICAL FUSION/FORAMINOTOMY N/A 01/28/2017   Procedure: POSTERIOR SPINAL FUSION CERVICAL 3-4, CERVICAL 4-5, CERVICAL 5-6, CERVICAL 6-7 WITH INSTRUMENATION AND ALLOGRAFT;  Surgeon: Phylliss Bob, MD;  Location: [facility];  Service: Orthopedics;  Laterality: N/A;  POSTERIOR SPINAL FUSION CERVICAL 3-4, CERVICAL 4-5, CERVICAL 5-6, CERVICAL 6-7 WITH INSTRUMENATION AND ALLOGRAFT   SHOULDER ARTHROSCOPY/ DEBRIDEMENT LABRAL TEAR/  BICEPS TENOTOMY Left 09-24-2011   TONSILLECTOMY  as child   TRANSTHORACIC ECHOCARDIOGRAM  02-21-2010   normal LVF/  ef 55-60%/ mild to moderate MR/  moderate LAE/  mild TR   YAG LASER APPLICATION Right 01/30/4764   Procedure: YAG LASER APPLICATION;  Surgeon: Myrtha Mantis., MD;  [facility];  Service: Ophthalmology;  Laterality: Right;   YAG LASER CAPSULOTOMY, LEFT EYE  01-08-2011    Social History: Social History   Tobacco Use   Smoking status: Every Day    Packs/day: 1.00    Years: 59.00    Pack years: 59.00    Types: Cigarettes   Smokeless tobacco: Never   Tobacco comments:    Started Chantix 10/26/16  Vaping Use   Vaping Use: Never used  Substance Use Topics   Alcohol use: No    Comment: none since approx 2014- heavy before   Drug use: No     Family History: Family History  Problem Relation Age of Onset   Unexplained death Mother        died at a young age, no known cause   Heart attack Father        age unknown   Diabetes Other    Cancer Other      Allergies and Medications: Allergies  Allergen Reactions   Tramadol     Confusion (intolerance)   No current facility-administered medications on file prior to encounter.   Current Outpatient Medications on File Prior to  Encounter  Medication Sig Dispense Refill   acetaminophen (TYLENOL) 325 MG tablet Take 2 tablets (650 mg total) by mouth every 4 (four) hours as needed for mild pain (or temp > 37.5 C (99.5 F)).     bimatoprost (LUMIGAN) 0.01 % SOLN Place 1 drop into both eyes at bedtime. 30 mL 3   brimonidine (ALPHAGAN) 0.2 %  ophthalmic solution Place 1 drop into both eyes 3 (three) times daily.     Brinzolamide-Brimonidine (SIMBRINZA) 1-0.2 % SUSP Place 1 drop into both eyes 3 (three) times daily.     clopidogrel (PLAVIX) 75 MG tablet Take 75 mg by mouth daily.     gabapentin (NEURONTIN) 300 MG capsule Take 300 mg by mouth daily.     Multiple Vitamin (MULTIVITAMIN WITH MINERALS) TABS tablet Take 1 tablet by mouth daily. One a day     MYRBETRIQ 25 MG TB24 tablet Take 25 mg by mouth daily.     oxyCODONE-acetaminophen (PERCOCET) 10-325 MG tablet Take 1 tablet by mouth every 8 (eight) hours as needed for up to 7 days for pain. 21 tablet 0   pantoprazole (PROTONIX) 40 MG tablet Take 1 tablet (40 mg total) by mouth daily. 30 tablet 0   pravastatin (PRAVACHOL) 80 MG tablet Take 80 mg by mouth daily.   2    Objective: BP (!) 118/53    Pulse 73    Temp 97.8 F (36.6 C) (Oral)    Resp 13    SpO2 100%  Exam: General: Patient lying comfortably in bed, NAD Eyes: White sclera, clear conjunctiva ENMT: Edentulous, no pharyngeal erythema or tonsillar exudate Cardiovascular: RRR, normal S1/S2 no murmurs Respiratory: CTAB, normal work of breathing on room air Gastrointestinal: Nontender, bowel sounds present, soft, nondistended Extremities: No edema BLEs, callus on left plantar surface Derm: Warm and dry Neuro: No focal deficits Psych: Mood and affect appropriate for situation  Labs and Imaging: CBC BMET  Recent Labs  Lab 02/13/22 1425  WBC 15.1*  HGB 8.1*  HCT 26.5*  PLT 509*   Recent Labs  Lab 02/13/22 1425  NA 134*  K 3.9  CL 99  CO2 23  BUN 51*  CREATININE 2.25*  GLUCOSE 123*  CALCIUM 9.5      EKG: shows sinus rhythm with multiform ventricular premature complexes, nonspecific T wave abnormalities, QTc of 467 and rate of 64 bpm  Precious Gilding, DO 02/13/2022, 7:58 PM PGY-1, [place] Intern pager: [phone], text pages welcome     FPTS Upper-Level Resident Addendum   I have independently interviewed and examined the patient. I have discussed the above with the original author and agree with their documentation. My edits for correction/addition/clarification are in within the document. Please see also any attending notes.   Rise Patience, DO  PGY-2, [facility] 02/13/2022 10:13 PM  [facility] Service pager: [phone] (text pages welcome through [place])

## 2022-02-14 ENCOUNTER — Encounter (HOSPITAL_COMMUNITY): Payer: Self-pay | Admitting: Family Medicine

## 2022-02-14 ENCOUNTER — Other Ambulatory Visit: Payer: Self-pay

## 2022-02-14 DIAGNOSIS — D631 Anemia in chronic kidney disease: Secondary | ICD-10-CM

## 2022-02-14 DIAGNOSIS — D500 Iron deficiency anemia secondary to blood loss (chronic): Secondary | ICD-10-CM

## 2022-02-14 DIAGNOSIS — R8281 Pyuria: Secondary | ICD-10-CM | POA: Diagnosis present

## 2022-02-14 DIAGNOSIS — R8271 Bacteriuria: Secondary | ICD-10-CM

## 2022-02-14 DIAGNOSIS — I739 Peripheral vascular disease, unspecified: Secondary | ICD-10-CM

## 2022-02-14 DIAGNOSIS — E1151 Type 2 diabetes mellitus with diabetic peripheral angiopathy without gangrene: Secondary | ICD-10-CM

## 2022-02-14 DIAGNOSIS — I5022 Chronic systolic (congestive) heart failure: Secondary | ICD-10-CM

## 2022-02-14 DIAGNOSIS — H409 Unspecified glaucoma: Secondary | ICD-10-CM | POA: Insufficient documentation

## 2022-02-14 DIAGNOSIS — I700 Atherosclerosis of aorta: Secondary | ICD-10-CM

## 2022-02-14 DIAGNOSIS — E7800 Pure hypercholesterolemia, unspecified: Secondary | ICD-10-CM

## 2022-02-14 DIAGNOSIS — G319 Degenerative disease of nervous system, unspecified: Secondary | ICD-10-CM

## 2022-02-14 DIAGNOSIS — F17200 Nicotine dependence, unspecified, uncomplicated: Secondary | ICD-10-CM

## 2022-02-14 DIAGNOSIS — N1832 Chronic kidney disease, stage 3b: Secondary | ICD-10-CM

## 2022-02-14 DIAGNOSIS — N189 Chronic kidney disease, unspecified: Secondary | ICD-10-CM

## 2022-02-14 HISTORY — DX: Atherosclerosis of aorta: I70.0

## 2022-02-14 HISTORY — DX: Degenerative disease of nervous system, unspecified: G31.9

## 2022-02-14 HISTORY — DX: Bacteriuria: R82.71

## 2022-02-14 LAB — CBC
HCT: 21.7 % — ABNORMAL LOW (ref 39.0–52.0)
HCT: 22.6 % — ABNORMAL LOW (ref 39.0–52.0)
Hemoglobin: 6.4 g/dL — CL (ref 13.0–17.0)
Hemoglobin: 7 g/dL — ABNORMAL LOW (ref 13.0–17.0)
MCH: 20.6 pg — ABNORMAL LOW (ref 26.0–34.0)
MCH: 21.5 pg — ABNORMAL LOW (ref 26.0–34.0)
MCHC: 29.5 g/dL — ABNORMAL LOW (ref 30.0–36.0)
MCHC: 31 g/dL (ref 30.0–36.0)
MCV: 70 fL — ABNORMAL LOW (ref 80.0–100.0)
MCV: 69.5 fL — ABNORMAL LOW (ref 80.0–100.0)
Platelets: 429 10*3/uL — ABNORMAL HIGH (ref 150–400)
Platelets: 473 10*3/uL — ABNORMAL HIGH (ref 150–400)
RBC: 3.1 MIL/uL — ABNORMAL LOW (ref 4.22–5.81)
RBC: 3.25 MIL/uL — ABNORMAL LOW (ref 4.22–5.81)
RDW: 23 % — ABNORMAL HIGH (ref 11.5–15.5)
RDW: 23 % — ABNORMAL HIGH (ref 11.5–15.5)
WBC: 10.1 10*3/uL (ref 4.0–10.5)
WBC: 8 10*3/uL (ref 4.0–10.5)
nRBC: 0 % (ref 0.0–0.2)
nRBC: 0 % (ref 0.0–0.2)

## 2022-02-14 LAB — BASIC METABOLIC PANEL
Anion gap: 8 (ref 5–15)
BUN: 37 mg/dL — ABNORMAL HIGH (ref 8–23)
CO2: 24 mmol/L (ref 22–32)
Calcium: 8.9 mg/dL (ref 8.9–10.3)
Chloride: 102 mmol/L (ref 98–111)
Creatinine, Ser: 1.63 mg/dL — ABNORMAL HIGH (ref 0.61–1.24)
GFR, Estimated: 42 mL/min — ABNORMAL LOW (ref >60)
Glucose, Bld: 98 mg/dL (ref 70–99)
Potassium: 3.9 mmol/L (ref 3.5–5.1)
Sodium: 134 mmol/L — ABNORMAL LOW (ref 135–145)

## 2022-02-14 LAB — HEMOGLOBIN AND HEMATOCRIT, BLOOD
HCT: 22.9 % — ABNORMAL LOW (ref 39.0–52.0)
Hemoglobin: 7 g/dL — ABNORMAL LOW (ref 13.0–17.0)

## 2022-02-14 LAB — PREPARE RBC (CROSSMATCH)

## 2022-02-14 MED ORDER — ADULT MULTIVITAMIN W/MINERALS CH
1.0000 | ORAL_TABLET | Freq: Every day | ORAL | Status: DC
Start: 2022-02-14 — End: 2022-02-16
  Administered 2022-02-14 – 2022-02-16 (×3): 1 via ORAL
  Filled 2022-02-14 (×3): qty 1

## 2022-02-14 MED ORDER — SODIUM CHLORIDE 0.9% IV SOLUTION: Freq: Once | INTRAVENOUS | Status: AC

## 2022-02-14 MED ORDER — SODIUM CHLORIDE 0.9 % IV SOLN
500.0000 mg | Freq: Once | INTRAVENOUS | Status: AC
  Administered 2022-02-14: 500 mg via INTRAVENOUS
  Filled 2022-02-14: qty 25

## 2022-02-14 MED ORDER — ENSURE ENLIVE PO LIQD
237.0000 mL | Freq: Two times a day (BID) | ORAL | Status: DC
  Administered 2022-02-14 – 2022-02-16 (×2): 237 mL via ORAL

## 2022-02-14 NOTE — Progress Notes (Signed)
Pt with Hgb of 6.4, decreased from 8.1 last night. Ordered repeat H&H and type and screen which pt is currently refusing. Went to see pt who has good mentation and is alert and oriented. Will have day team continue to address this issue and attempt to obtain labs.

## 2022-02-14 NOTE — Progress Notes (Signed)
Initial Nutrition Assessment  DOCUMENTATION CODES:   Not applicable  INTERVENTION:   -Ensure Enlive po BID, each supplement provides 350 kcal and 20 grams of protein -MVI with minerals daily  NUTRITION DIAGNOSIS:   Increased nutrient needs related to chronic illness (COPD) as evidenced by estimated needs.  GOAL:   Patient will meet greater than or equal to 90% of their needs  MONITOR:   PO intake, Supplement acceptance, Labs, Weight trends, Skin, I & O's  REASON FOR ASSESSMENT:   Malnutrition Screening Tool    ASSESSMENT:   Russell [name] is a 81 y.o. male presenting after a fall. PMH is significant for DM type II, subdural hematomas with frequent falls, history of alcohol use disorder, hypertension, PAD, COPD, CKD 3B.  Pt admitted with recurrent syncope secondary to iron deficiency anemia.   Reviewed I/O's: +220 ml x 24 hours  UOP: 1 L x 24 hours   Pt unavailable at time of visit. Attempted to speak with pt via call to hospital room phone, however, unable to reach. RD unable to obtain further nutrition-related history or complete nutrition-focused physical exam at this time.    Pt underwent blood transfusion today.   Pt currently on a regular diet. Noted meal completions 50-75%.   Reviewed wt hx; pt has experienced a 2.9% wt loss over the past 6 months, which is not significant for time frame.   Pt with increased nutritional needs and would benefit from addition of oral nutrition supplements.   Medications reviewed and include IV iron, miralax, and senokot.   Labs reviewed: Na: 134.    Diet Order:   Diet Order             Diet regular Room service appropriate? Yes; Fluid consistency: Thin  Diet effective now                   EDUCATION NEEDS:   No education needs have been identified at this time  Skin:  Skin Assessment: Reviewed RN Assessment  Last BM:  Unknown  Height:   Ht Readings from Last 1 Encounters:  02/14/22 6\' 2"  (1.88 m)     Weight:   Wt Readings from Last 1 Encounters:  02/14/22 71.4 kg    Ideal Body Weight:  86.4 kg  BMI:  Body mass index is 20.21 kg/m.  Estimated Nutritional Needs:   Kcal:  2150-2350  Protein:  105-120 grams  Fluid:  > 2 L    Loistine Chance, RD, LDN, [place] Registered Dietitian II Certified Diabetes Care and Education Specialist Please refer to [facility] for RD and/or RD on-call/weekend/after hours pager

## 2022-02-14 NOTE — Progress Notes (Addendum)
NEW ADMISSION NOTE New Admission Note:    Arrival Method: Stretcher from ED Mental Orientation: a&o x 4 Telemetry:#[id number]`NSR Assessment: Initiated Skin: WNL IV: LFA NSL Pain: None Tubes: None Safety Measures: Bed alarm Activated Admission: Initiated [facility] [place] Orientation: Patient has been oriented to the room, unit and staff.  Family: Not present   Orders have been reviewed and implemented. Will continue to monitor the patient. Call light has been placed within reach and bed alarm has been activated.

## 2022-02-14 NOTE — Progress Notes (Signed)
I attempted again to discuss the importance of receiving ordered care with the patient. I explained that the labs are required to determine if the blood and iron he received was effective to which the patient stated "As much blood as they've taken from me, they aren't taking any more. It doesn't make sense to give me some and then take it back out. Both of my hands and arms are killing me from all these needles. No more!" I then explained that it would only be a small amount of blood, to which he again said "No." Patient also attempted to remove his telemetry stating he didn't need that either, but I was able to persuade him to allow me to put it back on.

## 2022-02-14 NOTE — Progress Notes (Signed)
OT Cancellation Note  Patient Details Name: AMJAD FIKES MRN: [id number] DOB: 06-29-1941   Cancelled Treatment:    Reason Eval/Treat Not Completed: Patient declined, wanted to be left alone.  Try one additional attempt, then can discharge order.    Dannelle Rhymes D Vianca Bracher 02/14/2022, 11:44 AM

## 2022-02-14 NOTE — Progress Notes (Addendum)
FPTS Brief Progress Note  S Saw patient at bedside this evening. Patient was sleeping comfortably. I did not wake the patient.   O: BP 133/90    Pulse 62    Temp 98.9 F (37.2 C) (Oral)    Resp 18    Ht 6\' 2"  (1.88 m)    Wt 71.4 kg    SpO2 98%    BMI 20.21 kg/m    General: sleeping, no acute distress Cardio: well perfused  Pulm: normal work of breathing  A/P: Plan per day team -F/u H&H - Orders reviewed. Labs for AM ordered, which was adjusted as needed.  - If condition changes, plan includes bedside evaluation.   Lattie Haw, MD 02/14/2022, 7:47 PM PGY-3, [facility] Night Resident  Please page [phone] with questions.

## 2022-02-14 NOTE — Progress Notes (Signed)
PT Cancellation Note  Patient Details Name: Russell [name] MRN: [id number] DOB: 1941-02-18   Cancelled Treatment:    Reason Eval/Treat Not Completed: Patient declined, no reason specified Patient refused mobility or obtaining orthostatic vitals. Provided encouragement and education but patient continued to refuse. Will re-attempt at later date.   Korbin Mapps A. Gilford Rile PT, DPT Acute Rehabilitation Services Pager [phone] Office [phone]    Linna Hoff 02/14/2022, 11:44 AM

## 2022-02-14 NOTE — Progress Notes (Signed)
Family Medicine Teaching Service Daily Progress Note Intern Pager: [phone]  Patient name: Russell [name] Medical record number: [id number] Date of birth: 1941/09/10 Age: 81 y.o. Gender: male  Primary Care Provider: Marcie Mowers, FNP Consultants: none Code Status: Full  Pt Overview and Major Events to Date:  2/24: Admitted   Assessment and Plan: Russell [name] is a 81 y.o. male presenting after a fall. PMH is significant for DM type II, subdural hematomas with frequent falls, history of alcohol use disorder, hypertension, PAD, COPD, CKD 3B.   Recent Fall   Concern for syncopal episode Patient has been positive for orthostatics in the past, likely contributing to fall in the setting of hypotension and anemia that may require a transfusion. No known head trauma, CT head notable for no acute findings. Most recent echo a few weeks ago demonstrated EF 45-50% with global hypokinesis. Etiology likely multifactorial secondary to orthostatic hypotension, poor oral intake/dehydration and possibly worsening symptomatic anemia.  -orthostatic vitals -IV venofer  -repeat CBC, transfuse as appropriate, transfusion threshold 8 -PT/OT  -cardiac monitoring   Iron deficiency anemia Admitted with Hgb 8.1, dropped to Hgb 6.4 Possibly secondary to hemodilution after fluids. History of prior transfusions. Prior ferritin 5 and iron 17.  -IV venofer  -awaiting STAT repeat CBC -transfuse if below threshold of 8   AKI on CKD 3B Cr 1.63 compared to 2.25 on admission. Baseline around 1.6-1.9. AKI resolved with fluids, likely due to prerenal etiology secondary to poor PO intake and dehydration. Does not seem consistent with postrenal causes in spite of history of BPH.  -am BMP -limit nephrotoxic agents  -encourage oral hydration   Constipation Patient denies BM this morning, last BM 10 days ago.  -miralax bid  -senna daily    Hypotensive episodes in the setting of cHTN Positive orthostatics in  the past. Anemia possibly contributing to hypotensive episodes.  -orthostatics  -monitor BP   PAD Home medications include Plavix 75 mg and pravastatin 80 mg daily -hold plavix given possible bleed with drop in Hgb  -continue pravastatin    Neuroma of left foot Chronic and stable. -tylenol 650 mg every 6 hours as needed   BPH -continue Myrbetriq -bladder scans q8h   GERD -continue Protonix 40 mg daily   Tobacco use -nicotine patch 21    History of YAG capsulotomy -Continue home eyedrops of Lumigan, Alphagan, Simbrinza   FEN/GI: regular diet  PPx: SCDs Dispo:Home pending clinical improvement .   Subjective:  Patient previously declined further blood work, bladder scans and orthostatic vitals. I extensively discussed the need for this, as his symptomatic anemia is likely contributing to falls. Patient later agreeable to blood work. Denies any concerns other than "just not feeling well." He has not yet had a bowel movement.   Objective: Temp:  [97.8 F (36.6 C)-98.7 F (37.1 C)] 98.7 F (37.1 C) (02/25 0504) Pulse Rate:  [57-73] 72 (02/25 0504) Resp:  [12-19] 19 (02/25 0504) BP: (101-140)/(53-82) 140/82 (02/25 0504) SpO2:  [99 %-100 %] 100 % (02/25 0504) Weight:  [71.4 kg] 71.4 kg (02/25 0056) Physical Exam: General: Patient resting comfortably, in no acute distress. Cardiovascular: RRR, no murmurs or gallops auscultated  Respiratory: CTAB, no wheezing, rales or rhonchi noted  Abdomen: soft, nontender, presence of bowel sounds  Extremities: radial and distal pulses strong and equal bilaterally no LE edema noted bilaterally  Laboratory: Recent Labs  Lab 02/13/22 1425 02/14/22 0248  WBC 15.1* 10.1  HGB 8.1* 6.4*  HCT 26.5* 21.7*  PLT 509* 429*   Recent Labs  Lab 02/13/22 1425 02/14/22 0248  NA 134* 134*  K 3.9 3.9  CL 99 102  CO2 23 24  BUN 51* 37*  CREATININE 2.25* 1.63*  CALCIUM 9.5 8.9  PROT 7.1  --   BILITOT 0.4  --   ALKPHOS 47  --   ALT 16   --   AST 11*  --   GLUCOSE 123* 98      Imaging/Diagnostic Tests: CT ABDOMEN PELVIS WO CONTRAST  Result Date: 02/13/2022 CLINICAL DATA:  Epigastric pain.  Constipation. EXAM: CT ABDOMEN AND PELVIS WITHOUT CONTRAST TECHNIQUE: Multidetector CT imaging of the abdomen and pelvis was performed following the standard protocol without IV contrast. RADIATION DOSE REDUCTION: This exam was performed according to the departmental dose-optimization program which includes automated exposure control, adjustment of the mA and/or kV according to patient size and/or use of iterative reconstruction technique. COMPARISON:  02/07/2012 FINDINGS: Lower chest: Lung bases are clear. Minimal calcified plaque over the descending thoracic aorta. Hepatobiliary: 1.7 cm cyst over the right lobe unchanged. Gallbladder and biliary tree are normal. Pancreas: Normal. Spleen: Normal. Adrenals/Urinary Tract: Adrenal glands are normal. Kidneys are normal in size without hydronephrosis or nephrolithiasis. Several renal vascular calcifications are present. Cyst over the mid pole left kidney. Subcentimeter hypodensity over the mid to lower pole right kidney likely a cyst but too small to characterize. Decrease in size of a small exophytic isodense mass over the mid pole left kidney likely decrease in size of a cyst. Ureters and bladder are normal. Stomach/Bowel: Stomach and small bowel are normal. Moderate diverticulosis throughout the colon most prominent over the descending and sigmoid colon. Minimal fecal retention throughout the colon. Appendix is normal. Vascular/Lymphatic: Calcified plaque over the abdominal aorta which is normal in caliber. No adenopathy. Reproductive: Normal. Other: No free fluid or focal inflammatory change. Musculoskeletal: Degenerative change of the spine. Mild loss of mid vertebral body height of several lumbar vertebrae which is stable and sometimes seen with sickle cell disease. IMPRESSION: 1. No acute findings in  the abdomen/pelvis. 2. Colonic diverticulosis without evidence of acute diverticulitis. 3. 1.7 cm right liver cyst unchanged. 4. Bilateral renal cysts. 5. Aortic atherosclerosis. Aortic Atherosclerosis (ICD10-I70.0). Electronically Signed   By: Marin Olp M.D.   On: 02/13/2022 16:21   CT Head Wo Contrast  Addendum Date: 02/13/2022   ADDENDUM REPORT: 02/13/2022 16:52 ADDENDUM: Voice recognition error corrections: FINDINGS: Disc levels: NO  acute findings IMPRESSION: 2.  NO  acute cervical spine fracture Electronically Signed   By: Suzy Bouchard M.D.   On: 02/13/2022 16:52   Result Date: 02/13/2022 CLINICAL DATA:  Fall, EXAM: CT HEAD WITHOUT CONTRAST CT CERVICAL SPINE WITHOUT CONTRAST TECHNIQUE: Multidetector CT imaging of the head and cervical spine was performed following the standard protocol without intravenous contrast. Multiplanar CT image reconstructions of the cervical spine were also generated. RADIATION DOSE REDUCTION: This exam was performed according to the departmental dose-optimization program which includes automated exposure control, adjustment of the mA and/or kV according to patient size and/or use of iterative reconstruction technique. COMPARISON:  Head CT 02/01/2022 FINDINGS: CT HEAD FINDINGS Brain: No acute intracranial hemorrhage. No focal mass lesion. No CT evidence of acute infarction. No midline shift or mass effect. No hydrocephalus. Basilar cisterns are patent. There are periventricular and subcortical white matter hypodensities. Generalized cortical atrophy. Vascular: No hyperdense vessel or unexpected calcification. Skull: Normal. Negative for fracture or focal lesion. Sinuses/Orbits: Paranasal sinuses and mastoid air cells are  clear. Orbits are clear. Other: None. CT CERVICAL SPINE FINDINGS Alignment: Normal alignment of the cervical vertebral bodies. Posterior cervical fusion from C3-C7. Skull base and vertebrae: Normal craniocervical junction. No loss of vertebral body  height or disc height. Normal facet articulation. No evidence of fracture. Soft tissues and spinal canal: No prevertebral soft tissue swelling. No perispinal or epidural hematoma. Disc levels: Posterior fusion from C3-C7. Bony fusion of the facet joints and vertebral bodies. Acute findings. Upper chest: Paraseptal emphysema in the RIGHT upper lobe. Other: None IMPRESSION: 1. No acute intracranial trauma. 2. Acute cervical spine fracture. 3. Posterior cervical fusion. Bony fusion of the cervical vertebral bodies and facet joints. Electronically Signed: By: Suzy Bouchard M.D. On: 02/13/2022 16:23   CT Cervical Spine Wo Contrast  Addendum Date: 02/13/2022   ADDENDUM REPORT: 02/13/2022 16:52 ADDENDUM: Voice recognition error corrections: FINDINGS: Disc levels: NO  acute findings IMPRESSION: 2.  NO  acute cervical spine fracture Electronically Signed   By: Suzy Bouchard M.D.   On: 02/13/2022 16:52   Result Date: 02/13/2022 CLINICAL DATA:  Fall, EXAM: CT HEAD WITHOUT CONTRAST CT CERVICAL SPINE WITHOUT CONTRAST TECHNIQUE: Multidetector CT imaging of the head and cervical spine was performed following the standard protocol without intravenous contrast. Multiplanar CT image reconstructions of the cervical spine were also generated. RADIATION DOSE REDUCTION: This exam was performed according to the departmental dose-optimization program which includes automated exposure control, adjustment of the mA and/or kV according to patient size and/or use of iterative reconstruction technique. COMPARISON:  Head CT 02/01/2022 FINDINGS: CT HEAD FINDINGS Brain: No acute intracranial hemorrhage. No focal mass lesion. No CT evidence of acute infarction. No midline shift or mass effect. No hydrocephalus. Basilar cisterns are patent. There are periventricular and subcortical white matter hypodensities. Generalized cortical atrophy. Vascular: No hyperdense vessel or unexpected calcification. Skull: Normal. Negative for fracture or  focal lesion. Sinuses/Orbits: Paranasal sinuses and mastoid air cells are clear. Orbits are clear. Other: None. CT CERVICAL SPINE FINDINGS Alignment: Normal alignment of the cervical vertebral bodies. Posterior cervical fusion from C3-C7. Skull base and vertebrae: Normal craniocervical junction. No loss of vertebral body height or disc height. Normal facet articulation. No evidence of fracture. Soft tissues and spinal canal: No prevertebral soft tissue swelling. No perispinal or epidural hematoma. Disc levels: Posterior fusion from C3-C7. Bony fusion of the facet joints and vertebral bodies. Acute findings. Upper chest: Paraseptal emphysema in the RIGHT upper lobe. Other: None IMPRESSION: 1. No acute intracranial trauma. 2. Acute cervical spine fracture. 3. Posterior cervical fusion. Bony fusion of the cervical vertebral bodies and facet joints. Electronically Signed: By: Suzy Bouchard M.D. On: 02/13/2022 16:23     Donney Dice, DO 02/14/2022, 9:26 AM PGY-2, [place] Intern pager: [phone], text pages welcome

## 2022-02-14 NOTE — Plan of Care (Signed)
  Problem: Health Behavior/Discharge Planning: Goal: Ability to manage health-related needs will improve Outcome: Progressing   Problem: Clinical Measurements: Goal: Ability to maintain clinical measurements within normal limits will improve Outcome: Progressing   

## 2022-02-15 DIAGNOSIS — I96 Gangrene, not elsewhere classified: Secondary | ICD-10-CM

## 2022-02-15 LAB — CBC
HCT: 24.7 % — ABNORMAL LOW (ref 39.0–52.0)
Hemoglobin: 7.8 g/dL — ABNORMAL LOW (ref 13.0–17.0)
MCH: 22.3 pg — ABNORMAL LOW (ref 26.0–34.0)
MCHC: 31.6 g/dL (ref 30.0–36.0)
MCV: 70.6 fL — ABNORMAL LOW (ref 80.0–100.0)
Platelets: 405 10*3/uL — ABNORMAL HIGH (ref 150–400)
RBC: 3.5 MIL/uL — ABNORMAL LOW (ref 4.22–5.81)
RDW: 22.1 % — ABNORMAL HIGH (ref 11.5–15.5)
WBC: 8.8 10*3/uL (ref 4.0–10.5)
nRBC: 0 % (ref 0.0–0.2)

## 2022-02-15 LAB — HEMOGLOBIN AND HEMATOCRIT, BLOOD
HCT: 28.8 % — ABNORMAL LOW (ref 39.0–52.0)
Hemoglobin: 9 g/dL — ABNORMAL LOW (ref 13.0–17.0)

## 2022-02-15 LAB — BASIC METABOLIC PANEL
Anion gap: 11 (ref 5–15)
BUN: 26 mg/dL — ABNORMAL HIGH (ref 8–23)
CO2: 21 mmol/L — ABNORMAL LOW (ref 22–32)
Calcium: 8.8 mg/dL — ABNORMAL LOW (ref 8.9–10.3)
Chloride: 106 mmol/L (ref 98–111)
Creatinine, Ser: 1.5 mg/dL — ABNORMAL HIGH (ref 0.61–1.24)
GFR, Estimated: 46 mL/min — ABNORMAL LOW (ref >60)
Glucose, Bld: 106 mg/dL — ABNORMAL HIGH (ref 70–99)
Potassium: 4.1 mmol/L (ref 3.5–5.1)
Sodium: 138 mmol/L (ref 135–145)

## 2022-02-15 LAB — PREPARE RBC (CROSSMATCH)

## 2022-02-15 NOTE — Plan of Care (Signed)
  Problem: Clinical Measurements: Goal: Diagnostic test results will improve Outcome: Progressing   

## 2022-02-15 NOTE — Progress Notes (Signed)
PT Cancellation Note  Patient Details Name: Russell [name] MRN: [id number] DOB: 21-Feb-1941   Cancelled Treatment:    Reason Eval/Treat Not Completed: Patient declined, no reason specified  Able to ask questions re: home and PLOF; pt mostly uses a power chair for mobility, except his hoveround does not fit in the bathroom;   Will reattempt as able;   Roney Marion, PT  Acute Rehabilitation Services Pager [phone] Office [phone]   Colletta [place] 02/15/2022, 1:23 PM

## 2022-02-15 NOTE — Progress Notes (Addendum)
Patient refusing labs for evening.  He said "they keep taking blood from me and then giving blood to me, I do not want any more blood draws".  I explained why it is necessary to do the blood draws and as we are monitoring for his anemia as he may need further transfusions.  He said that he will consider getting a blood draw in the morning.

## 2022-02-15 NOTE — Evaluation (Signed)
Occupational Therapy Evaluation Patient Details Name: Russell [name] MRN: [id number] DOB: 28-Jul-1941 Today's Date: 02/15/2022   History of Present Illness 81 y/o M presenting to ED on 2/24 after syncopal episode wtih LOC, admitted earlier this month for similar. PMH includes frequent falls with subdural hematomas, alcohol use disorder, HTN, PAD, and COPD   Clinical Impression   Pt reports he is independent with ADLs at baseline, mostly transfers/ambulates short distances in his home, otherwise uses power w/c. Pt lives with family who can provide assistance at d/c. OT evaluation and home/PLOF questions very limited due to pt agitation, however is able to perform bed mobility and fine motor tasks with supervision at bedside table while long sitting in bed. Per RN, pt has been getting up to bathroom on his own, but has been incontinent when ambulating. Pt refusing OOB mobility at this time, despite max encouragement and education on the importance of mobility during acute hospital stay.   In order to return home with family assistance, pt would need to be able to perform ADLs and transfer/walk short distances with supervision to simulate home environment/transfers to power chair. Will continue to assess pt DME needs in future sessions. Pt presenting with impairments listed below. Recommend HHOT at d/c pending pt progress.     Recommendations for follow up therapy are one component of a multi-disciplinary discharge planning process, led by the attending physician.  Recommendations may be updated based on patient status, additional functional criteria and insurance authorization.   Follow Up Recommendations  Home health OT    Assistance Recommended at Discharge Set up Supervision/Assistance  Patient can return home with the following A little help with bathing/dressing/bathroom;Assist for transportation;Assistance with cooking/housework;A little help with walking and/or transfers    Functional  Status Assessment  Patient has had a recent decline in their functional status and demonstrates the ability to make significant improvements in function in a reasonable and predictable amount of time.  Equipment Recommendations  None recommended by OT    Recommendations for Other Services       Precautions / Restrictions Precautions Precautions: Fall Precaution Comments: Poor safety awareness Restrictions Weight Bearing Restrictions: No      Mobility Bed Mobility Overal bed mobility: Needs Assistance             General bed mobility comments: able to use BUE to bring self into long sitting in order to drink/make coffee on bedside table    Transfers                   General transfer comment: pt refusing OOB mobility at this time      Balance                                           ADL either performed or assessed with clinical judgement   ADL Overall ADL's : Needs assistance/impaired Eating/Feeding: Set up;Sitting Eating/Feeding Details (indicate cue type and reason): able to make coffee/open packets while long sitting in bed Grooming: Set up;Sitting   Upper Body Bathing: Minimal assistance;Sitting   Lower Body Bathing: Minimal assistance;Sitting/lateral leans   Upper Body Dressing : Minimal assistance;Sitting   Lower Body Dressing: Minimal assistance;Sitting/lateral leans   Toilet Transfer: Min guard;Ambulation;Regular Toilet   Toileting- Water quality scientist and Hygiene: Min guard;Sitting/lateral lean       Functional mobility during ADLs: Minimal assistance;Cueing for safety  Vision   Vision Assessment?: No apparent visual deficits     Perception     Praxis      Pertinent Vitals/Pain Pain Assessment Pain Assessment: No/denies pain     Hand Dominance Right   Extremity/Trunk Assessment Upper Extremity Assessment Upper Extremity Assessment: Generalized weakness   Lower Extremity Assessment Lower  Extremity Assessment: Defer to PT evaluation       Communication Communication Communication: No difficulties   Cognition Arousal/Alertness: Awake/alert Behavior During Therapy: Agitated Overall Cognitive Status: Difficult to assess                                 General Comments: pt refusing mobility at this time, states he will get OOB when he wants to     General Comments       Exercises     Shoulder Instructions      Home Living Family/patient expects to be discharged to:: Private residence Living Arrangements: Spouse/significant other;Children;Other relatives Available Help at Discharge: Family;Available 24 hours/day Type of Home: House Home Access: Ramped entrance     Home Layout: One level     Bathroom Shower/Tub: Occupational psychologist: Handicapped height     Home Equipment: Conservation officer, nature (2 wheels);Cane - single point;Wheelchair - power          Prior Functioning/Environment Prior Level of Function : Needs assist             Mobility Comments: furniture walks for household distances, uses power w/c for community distances          OT Problem List: Decreased strength;Decreased activity tolerance;Decreased range of motion;Impaired balance (sitting and/or standing);Decreased cognition;Decreased safety awareness;Decreased knowledge of use of DME or AE;Decreased knowledge of precautions      OT Treatment/Interventions: Self-care/ADL training;Therapeutic exercise;Energy conservation;DME and/or AE instruction;Therapeutic activities;Patient/family education;Balance training    OT Goals(Current goals can be found in the care plan section) Acute Rehab OT Goals Patient Stated Goal: to go home OT Goal Formulation: With patient Time For Goal Achievement: 03/01/22 Potential to Achieve Goals: Good ADL Goals Pt Will Perform Upper Body Dressing: Independently;standing;sitting Pt Will Perform Lower Body Dressing:  Independently;sitting/lateral leans;sit to/from stand Pt Will Transfer to Toilet: ambulating;regular height toilet;with supervision  OT Frequency: Min 2X/week    Co-evaluation              AM-PAC OT "6 Clicks" Daily Activity     Outcome Measure Help from another person eating meals?: None Help from another person taking care of personal grooming?: A Little Help from another person toileting, which includes using toliet, bedpan, or urinal?: A Little Help from another person bathing (including washing, rinsing, drying)?: A Little Help from another person to put on and taking off regular upper body clothing?: A Little Help from another person to put on and taking off regular lower body clothing?: A Little 6 Click Score: 19   End of Session Nurse Communication: Mobility status  Activity Tolerance: Treatment limited secondary to agitation Patient left: in bed;with call bell/phone within reach;with bed alarm set;with nursing/sitter in room  OT Visit Diagnosis: Muscle weakness (generalized) (M62.81)                Time: 3825-0539 OT Time Calculation (min): 20 min Charges:  OT General Charges $OT Visit: 1 Visit OT Evaluation $OT Eval Low Complexity: 1 Low  Leathia Farnell, OTD, OTR/L Acute Rehab [phone]  Ellie Lunch  Jamilya Sarrazin 02/15/2022, 5:06 PM

## 2022-02-15 NOTE — Progress Notes (Signed)
Patient refused to let me obtain vital signs this morning.

## 2022-02-15 NOTE — Progress Notes (Signed)
FPTS Brief Progress Note  S:Patient resting comfortably in bed, did not disturb.  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 Nurse states he told his wife to bring the medication in so we know what he is talking about.  She also notes that he has declined his miralax after having incontinence.   O: BP 136/76 (BP Location: Left Arm)    Pulse 60    Temp 98.8 F (37.1 C) (Oral)    Resp 16    Ht 6\' 2"  (1.88 m)    Wt 71.4 kg    SpO2 100%    BMI 20.21 kg/m   General: NAD, laying on his left side in bed, resting comfortably Respiratory: breathing comfortably on room air  A/P: Anemia Hemoglobin after transfusion reassuringly increased to 9.  - Orders reviewed. Labs for AM ordered, which was adjusted as needed.  - If signs of acute bleeding, will order another CBC to evaluate need for transfusion  Constipation, resolved - changed MiraLax and senna to daily PRN  Unknown medication Upon chart review, the only recent medications I can see from the last hospitalization that started with a T was Tamsulosin, which was discontinued after his last hospitalization (and if this is what he was taking at home, it likely contributed to his current hospitalization). Will await the medication being brought in by his wife to determine which medication was being taken.   Rise Patience, DO 02/16/2022, 12:05 AM PGY-2, [facility] Night Resident  Please page [phone] with questions.

## 2022-02-15 NOTE — Progress Notes (Addendum)
Family Medicine Teaching Service Daily Progress Note Intern Pager: [phone]  Patient name: Russell [name] Medical record number: [id number] Date of birth: Nov 13, 1941 Age: 81 y.o. Gender: male  Primary Care Provider: Marcie Mowers, FNP Consultants: None Code Status: FULL   Pt Overview and Major Events to Date:  2/24: Admitted  Assessment and Plan: Russell [name] is a 81 y.o. male presenting after a fall. PMH is significant for DM type II, subdural hematomas with frequent falls, history of alcohol use disorder, hypertension, PAD, COPD, CKD 3B  Fall   possible syncopal episode Likely due to orthostatic hypotension, poor p.o. intake/dehydration and possible worsening anemia.   Patient refusing vital signs overnight. Last BP was yesterday at 5pm which was 133/90. Pt has declined vital sign since then. He did ambulate to the bathroom last night per RN with no concerns for dizziness.  -Continue to monitor blood pressure -PT OT  Iron deficiency anemia Hb this morning 7.8. Now s/p 1 URBC. Pt refused H&H last night. Ferritin 2 weeks ago.  S/p Venofer infusion.  -Hb transfusion threshold 8 -Transfuse 1URBC -Follow up H&H  Constipation Large bowel movement overnight per RN -Continue MiraLAX twice daily -Continue senna  PAD -Currently holding Plavix due to possibility of bleeding -Continue pravastatin 80mg   BPH -Continue Myrbetriq -Bladder scans every 8 hours  GERD -Continue Protonix  Tobacco use disorder -Continue nicotine patch 21 mg  FEN/GI: Regular diet PPx: SCDs Dispo:Home pending clinical improvement . Barriers include compliance  Subjective:  Pt declined most interventions overnight. When speaking to him this morning he said I was aggravating him and declined to talk to me and declined physical exam. He did say he was happy to receive blood if needed again.  Objective: Temp:  [98.1 F (36.7 C)-98.9 F (37.2 C)] 98.9 F (37.2 C) (02/25 1715) Pulse Rate:   [62-68] 62 (02/25 1715) Resp:  [16-18] 18 (02/25 1715) BP: (94-133)/(50-90) 133/90 (02/25 1715) SpO2:  [96 %-99 %] 98 % (02/25 1715)  Physical Exam:  General: Alert, no acute distress Cardio: well perfused  Pulm: normal work of breathing Neuro: Cranial nerves grossly intact   Laboratory: Recent Labs  Lab 02/14/22 0248 02/14/22 1058 02/15/22 0341  WBC 10.1 8.0 8.8  HGB 6.4* 7.0*   7.0* 7.8*  HCT 21.7* 22.6*   22.9* 24.7*  PLT 429* 473* 405*   Recent Labs  Lab 02/13/22 1425 02/14/22 0248 02/15/22 0341  NA 134* 134* 138  K 3.9 3.9 4.1  CL 99 102 106  CO2 23 24 21*  BUN 51* 37* 26*  CREATININE 2.25* 1.63* 1.50*  CALCIUM 9.5 8.9 8.8*  PROT 7.1  --   --   BILITOT 0.4  --   --   ALKPHOS 47  --   --   ALT 16  --   --   AST 11*  --   --   GLUCOSE 123* 98 106*      Imaging/Diagnostic Tests: CT ABDOMEN PELVIS WO CONTRAST  Result Date: 02/13/2022 CLINICAL DATA:  Epigastric pain.  Constipation. EXAM: CT ABDOMEN AND PELVIS WITHOUT CONTRAST TECHNIQUE: Multidetector CT imaging of the abdomen and pelvis was performed following the standard protocol without IV contrast. RADIATION DOSE REDUCTION: This exam was performed according to the departmental dose-optimization program which includes automated exposure control, adjustment of the mA and/or kV according to patient size and/or use of iterative reconstruction technique. COMPARISON:  02/07/2012 FINDINGS: Lower chest: Lung bases are clear. Minimal calcified plaque over the descending thoracic  aorta. Hepatobiliary: 1.7 cm cyst over the right lobe unchanged. Gallbladder and biliary tree are normal. Pancreas: Normal. Spleen: Normal. Adrenals/Urinary Tract: Adrenal glands are normal. Kidneys are normal in size without hydronephrosis or nephrolithiasis. Several renal vascular calcifications are present. Cyst over the mid pole left kidney. Subcentimeter hypodensity over the mid to lower pole right kidney likely a cyst but too small to  characterize. Decrease in size of a small exophytic isodense mass over the mid pole left kidney likely decrease in size of a cyst. Ureters and bladder are normal. Stomach/Bowel: Stomach and small bowel are normal. Moderate diverticulosis throughout the colon most prominent over the descending and sigmoid colon. Minimal fecal retention throughout the colon. Appendix is normal. Vascular/Lymphatic: Calcified plaque over the abdominal aorta which is normal in caliber. No adenopathy. Reproductive: Normal. Other: No free fluid or focal inflammatory change. Musculoskeletal: Degenerative change of the spine. Mild loss of mid vertebral body height of several lumbar vertebrae which is stable and sometimes seen with sickle cell disease. IMPRESSION: 1. No acute findings in the abdomen/pelvis. 2. Colonic diverticulosis without evidence of acute diverticulitis. 3. 1.7 cm right liver cyst unchanged. 4. Bilateral renal cysts. 5. Aortic atherosclerosis. Aortic Atherosclerosis (ICD10-I70.0). Electronically Signed   By: Marin Olp M.D.   On: 02/13/2022 16:21   CT Head Wo Contrast  Addendum Date: 02/13/2022   ADDENDUM REPORT: 02/13/2022 16:52 ADDENDUM: Voice recognition error corrections: FINDINGS: Disc levels: NO  acute findings IMPRESSION: 2.  NO  acute cervical spine fracture Electronically Signed   By: Suzy Bouchard M.D.   On: 02/13/2022 16:52   Result Date: 02/13/2022 CLINICAL DATA:  Fall, EXAM: CT HEAD WITHOUT CONTRAST CT CERVICAL SPINE WITHOUT CONTRAST TECHNIQUE: Multidetector CT imaging of the head and cervical spine was performed following the standard protocol without intravenous contrast. Multiplanar CT image reconstructions of the cervical spine were also generated. RADIATION DOSE REDUCTION: This exam was performed according to the departmental dose-optimization program which includes automated exposure control, adjustment of the mA and/or kV according to patient size and/or use of iterative reconstruction  technique. COMPARISON:  Head CT 02/01/2022 FINDINGS: CT HEAD FINDINGS Brain: No acute intracranial hemorrhage. No focal mass lesion. No CT evidence of acute infarction. No midline shift or mass effect. No hydrocephalus. Basilar cisterns are patent. There are periventricular and subcortical white matter hypodensities. Generalized cortical atrophy. Vascular: No hyperdense vessel or unexpected calcification. Skull: Normal. Negative for fracture or focal lesion. Sinuses/Orbits: Paranasal sinuses and mastoid air cells are clear. Orbits are clear. Other: None. CT CERVICAL SPINE FINDINGS Alignment: Normal alignment of the cervical vertebral bodies. Posterior cervical fusion from C3-C7. Skull base and vertebrae: Normal craniocervical junction. No loss of vertebral body height or disc height. Normal facet articulation. No evidence of fracture. Soft tissues and spinal canal: No prevertebral soft tissue swelling. No perispinal or epidural hematoma. Disc levels: Posterior fusion from C3-C7. Bony fusion of the facet joints and vertebral bodies. Acute findings. Upper chest: Paraseptal emphysema in the RIGHT upper lobe. Other: None IMPRESSION: 1. No acute intracranial trauma. 2. Acute cervical spine fracture. 3. Posterior cervical fusion. Bony fusion of the cervical vertebral bodies and facet joints. Electronically Signed: By: Suzy Bouchard M.D. On: 02/13/2022 16:23   CT HEAD WO CONTRAST  Result Date: 02/01/2022 CLINICAL DATA:  Neuro deficit, acute, stroke suspected.  Syncope. EXAM: CT HEAD WITHOUT CONTRAST TECHNIQUE: Contiguous axial images were obtained from the base of the skull through the vertex without intravenous contrast. RADIATION DOSE REDUCTION: This exam was performed  according to the departmental dose-optimization program which includes automated exposure control, adjustment of the mA and/or kV according to patient size and/or use of iterative reconstruction technique. COMPARISON:  Head CT 09/15/2021 FINDINGS:  Brain: There is no evidence of an acute infarct, intracranial hemorrhage, mass, midline shift, or extra-axial fluid collection. Subdural collections at the vertex on the prior CT have resolved. There is mild cerebral atrophy. Patchy hypodensities in the cerebral white matter bilaterally are unchanged and nonspecific but compatible with moderate chronic small vessel ischemic disease. Vascular: Calcified atherosclerosis at the skull base. No hyperdense vessel. Skull: Partial coverage of previously-seen left frontal skull, skull base, and maxillofacial fractures. Sinuses/Orbits: Bilateral cataract extraction. Visualized paranasal sinuses and mastoid air cells are clear. Other: None. IMPRESSION: 1. No evidence of acute intracranial abnormality. 2. Moderate chronic small vessel ischemic disease. Electronically Signed   By: Logan Bores M.D.   On: 02/01/2022 13:57   CT Cervical Spine Wo Contrast  Addendum Date: 02/13/2022   ADDENDUM REPORT: 02/13/2022 16:52 ADDENDUM: Voice recognition error corrections: FINDINGS: Disc levels: NO  acute findings IMPRESSION: 2.  NO  acute cervical spine fracture Electronically Signed   By: Suzy Bouchard M.D.   On: 02/13/2022 16:52   Result Date: 02/13/2022 CLINICAL DATA:  Fall, EXAM: CT HEAD WITHOUT CONTRAST CT CERVICAL SPINE WITHOUT CONTRAST TECHNIQUE: Multidetector CT imaging of the head and cervical spine was performed following the standard protocol without intravenous contrast. Multiplanar CT image reconstructions of the cervical spine were also generated. RADIATION DOSE REDUCTION: This exam was performed according to the departmental dose-optimization program which includes automated exposure control, adjustment of the mA and/or kV according to patient size and/or use of iterative reconstruction technique. COMPARISON:  Head CT 02/01/2022 FINDINGS: CT HEAD FINDINGS Brain: No acute intracranial hemorrhage. No focal mass lesion. No CT evidence of acute infarction. No midline  shift or mass effect. No hydrocephalus. Basilar cisterns are patent. There are periventricular and subcortical white matter hypodensities. Generalized cortical atrophy. Vascular: No hyperdense vessel or unexpected calcification. Skull: Normal. Negative for fracture or focal lesion. Sinuses/Orbits: Paranasal sinuses and mastoid air cells are clear. Orbits are clear. Other: None. CT CERVICAL SPINE FINDINGS Alignment: Normal alignment of the cervical vertebral bodies. Posterior cervical fusion from C3-C7. Skull base and vertebrae: Normal craniocervical junction. No loss of vertebral body height or disc height. Normal facet articulation. No evidence of fracture. Soft tissues and spinal canal: No prevertebral soft tissue swelling. No perispinal or epidural hematoma. Disc levels: Posterior fusion from C3-C7. Bony fusion of the facet joints and vertebral bodies. Acute findings. Upper chest: Paraseptal emphysema in the RIGHT upper lobe. Other: None IMPRESSION: 1. No acute intracranial trauma. 2. Acute cervical spine fracture. 3. Posterior cervical fusion. Bony fusion of the cervical vertebral bodies and facet joints. Electronically Signed: By: Suzy Bouchard M.D. On: 02/13/2022 16:23   DG Chest Portable 1 View  Result Date: 02/01/2022 CLINICAL DATA:  Weakness. EXAM: PORTABLE CHEST 1 VIEW COMPARISON:  09/15/2021 FINDINGS: 1232 hours. The lungs are clear without focal pneumonia, edema, pneumothorax or pleural effusion. The cardiopericardial silhouette is within normal limits for size. The visualized bony structures of the thorax show no acute abnormality. Telemetry leads overlie the chest. IMPRESSION: No active disease. Electronically Signed   By: Misty Stanley M.D.   On: 02/01/2022 13:11   ECHOCARDIOGRAM COMPLETE  Result Date: 02/02/2022    ECHOCARDIOGRAM REPORT   Patient Name:   ITZEL LOWRIMORE Date of Exam: 02/02/2022 Medical Rec #:  [id number]  Height:       74.0 in Accession #:    [id number]         Weight:       165.0 lb Date of Birth:  1941/09/07        BSA:          2.003 m Patient Age:    27 years          BP:           119/63 mmHg Patient Gender: M                 HR:           63 bpm. Exam Location:  Inpatient Procedure: 2D Echo, Cardiac Doppler and Color Doppler Indications:    Syncope  History:        Patient has prior history of Echocardiogram examinations, most                 recent 07/08/2021. COPD, Signs/Symptoms:Murmur and Syncope; Risk                 Factors:Hypertension, Dyslipidemia and Diabetes.  Sonographer:    Luisa Hart RDCS Referring Phys: [id number] CARINA M BROWN  Sonographer Comments: Image acquisition challenging due to uncooperative patient. IMPRESSIONS  1. Left ventricular ejection fraction, by estimation, is 45 to 50%. The left ventricle has mildly decreased function. The left ventricle demonstrates global hypokinesis. There is moderate left ventricular hypertrophy. Left ventricular diastolic parameters are indeterminate.  2. Right ventricular systolic function is normal. The right ventricular size is normal.  3. The mitral valve is myxomatous. Mild mitral valve regurgitation.  4. The aortic valve is tricuspid. Aortic valve regurgitation is not visualized. No aortic stenosis is present. FINDINGS  Left Ventricle: Left ventricular ejection fraction, by estimation, is 45 to 50%. The left ventricle has mildly decreased function. The left ventricle demonstrates global hypokinesis. The left ventricular internal cavity size was normal in size. There is  moderate left ventricular hypertrophy. Left ventricular diastolic parameters are indeterminate. Right Ventricle: The right ventricular size is normal. Right vetricular wall thickness was not well visualized. Right ventricular systolic function is normal. Left Atrium: Left atrial size was not assessed. Right Atrium: Right atrial size was normal in size. Pericardium: There is no evidence of pericardial effusion. Mitral Valve: The mitral valve  is myxomatous. Mild mitral valve regurgitation. Tricuspid Valve: The tricuspid valve is normal in structure. Tricuspid valve regurgitation is trivial. Aortic Valve: The aortic valve is tricuspid. Aortic valve regurgitation is not visualized. No aortic stenosis is present. Pulmonic Valve: The pulmonic valve was not well visualized. Pulmonic valve regurgitation is not visualized. Aorta: The aortic root and ascending aorta are structurally normal, with no evidence of dilitation. IAS/Shunts: The interatrial septum was not assessed.  LEFT VENTRICLE PLAX 2D LVIDd:         4.90 cm      Diastology LVIDs:         3.90 cm      LV e' medial:  4.90 cm/s LV PW:         1.50 cm      LV e' lateral: 4.13 cm/s LV IVS:        1.20 cm  LV Volumes (MOD) LV vol d, MOD A2C: 92.0 ml LV vol d, MOD A4C: 111.0 ml LV vol s, MOD A2C: 45.0 ml LV vol s, MOD A4C: 59.2 ml LV SV MOD A2C:     47.0 ml LV SV MOD A4C:  111.0 ml LV SV MOD BP:      50.4 ml RIGHT VENTRICLE RV Basal diam:  3.40 cm RV Mid diam:    3.10 cm TAPSE (M-mode): 2.0 cm RIGHT ATRIUM           Index RA Area:     15.70 cm RA Volume:   36.70 ml  18.32 ml/m  AORTIC VALVE             PULMONIC VALVE LVOT Vmax:   84.84 cm/s  PV Vmax:          0.90 m/s LVOT Vmean:  53.161 cm/s PV Vmean:         62.400 cm/s LVOT VTI:    0.163 m     PV VTI:           0.204 m                          PV Peak grad:     3.3 mmHg AORTA                    PV Mean grad:     2.0 mmHg Ao Asc diam: 3.50 cm     PR End Diast Vel: 1.53 msec  TRICUSPID VALVE TR Peak grad:   23.6 mmHg TR Vmax:        243.00 cm/s  SHUNTS Systemic VTI: 0.16 m Oswaldo Milian MD Electronically signed by Oswaldo Milian MD Signature Date/Time: 02/02/2022/3:04:55 PM    Final      Lattie Haw, MD 02/15/2022, 7:22 AM PGY-3, [place] Intern pager: [phone], text pages welcome

## 2022-02-15 NOTE — Plan of Care (Signed)
  Problem: Health Behavior/Discharge Planning: Goal: Ability to manage health-related needs will improve Outcome: Progressing   Problem: Clinical Measurements: Goal: Ability to maintain clinical measurements within normal limits will improve Outcome: Progressing Goal: Diagnostic test results will improve Outcome: Progressing   

## 2022-02-15 NOTE — Progress Notes (Signed)
Lab attempted to obtain ordered H&H and I attempted to obtain vital signs. Patient refused to be touched.

## 2022-02-15 NOTE — Progress Notes (Signed)
Patient refused post transfusion H&H, lab will attempt to draw again.  MD aware.   Aurther Loft, RN

## 2022-02-16 ENCOUNTER — Other Ambulatory Visit (HOSPITAL_COMMUNITY): Payer: Self-pay

## 2022-02-16 DIAGNOSIS — E1151 Type 2 diabetes mellitus with diabetic peripheral angiopathy without gangrene: Secondary | ICD-10-CM

## 2022-02-16 DIAGNOSIS — I96 Gangrene, not elsewhere classified: Secondary | ICD-10-CM

## 2022-02-16 DIAGNOSIS — I739 Peripheral vascular disease, unspecified: Secondary | ICD-10-CM

## 2022-02-16 DIAGNOSIS — F17200 Nicotine dependence, unspecified, uncomplicated: Secondary | ICD-10-CM

## 2022-02-16 DIAGNOSIS — D500 Iron deficiency anemia secondary to blood loss (chronic): Secondary | ICD-10-CM

## 2022-02-16 LAB — BPAM RBC
Blood Product Expiration Date: 202303292359
Blood Product Expiration Date: 202303292359
ISSUE DATE / TIME: 202302251425
ISSUE DATE / TIME: 202302261042
Unit Type and Rh: 5100
Unit Type and Rh: 5100

## 2022-02-16 LAB — TYPE AND SCREEN
ABO/RH(D): O POS
Antibody Screen: NEGATIVE
Unit division: 0
Unit division: 0

## 2022-02-16 LAB — CBC
HCT: 29.5 % — ABNORMAL LOW (ref 39.0–52.0)
Hemoglobin: 8.9 g/dL — ABNORMAL LOW (ref 13.0–17.0)
MCH: 22.3 pg — ABNORMAL LOW (ref 26.0–34.0)
MCHC: 30.2 g/dL (ref 30.0–36.0)
MCV: 73.9 fL — ABNORMAL LOW (ref 80.0–100.0)
Platelets: 414 10*3/uL — ABNORMAL HIGH (ref 150–400)
RBC: 3.99 MIL/uL — ABNORMAL LOW (ref 4.22–5.81)
RDW: 23.7 % — ABNORMAL HIGH (ref 11.5–15.5)
WBC: 10.2 10*3/uL (ref 4.0–10.5)
nRBC: 0 % (ref 0.0–0.2)

## 2022-02-16 MED ORDER — POLYETHYLENE GLYCOL 3350 17 G PO PACK: 17.0000 g | PACK | Freq: Every day | ORAL | Status: DC | PRN

## 2022-02-16 MED ORDER — FERROUS SULFATE 325 (65 FE) MG PO TABS
325.0000 mg | ORAL_TABLET | ORAL | 0 refills | Status: DC
  Filled 2022-02-16: qty 100, 200d supply, fill #0

## 2022-02-16 MED ORDER — SENNOSIDES-DOCUSATE SODIUM 8.6-50 MG PO TABS: 1.0000 | ORAL_TABLET | Freq: Every day | ORAL | Status: DC | PRN

## 2022-02-16 NOTE — Evaluation (Signed)
Physical Therapy Evaluation and Discharge Patient Details Name: Russell [name] MRN: [id number] DOB: 10/16/1941 Today's Date: 02/16/2022  History of Present Illness  81 y/o M presenting to ED on 2/24 after syncopal episode wtih LOC, admitted earlier this month for similar. PMH includes frequent falls with subdural hematomas, alcohol use disorder, HTN, PAD, and COPD  Clinical Impression   Patient evaluated by Physical Therapy with no further acute PT needs identified, as he is to dc home today; All education has been completed to the extent possible, and the patient has no further questions.  See below for any follow-up Physical Therapy or equipment needs. PT is signing off. Thank you for this referral.       02/16/22 1158  Vital Signs  Patient Position (if appropriate) Orthostatic Vitals  Orthostatic Lying   BP- Lying 124/64  Pulse- Lying 60  Orthostatic Sitting  BP- Sitting 115/66  Pulse- Sitting 66  Orthostatic Standing at 0 minutes  BP- Standing at 0 minutes 121/70  Pulse- Standing at 0 minutes 77  Orthostatic Standing at 3 minutes  BP- Standing at 3 minutes  (Unable to obtain)      Recommendations for follow up therapy are one component of a multi-disciplinary discharge planning process, led by the attending physician.  Recommendations may be updated based on patient status, additional functional criteria and insurance authorization.  Follow Up Recommendations  (Pt reports he will be fine at home, and that he has any assist he needs; I do not feel strongly he needs HHPT follow up, but I do recognize that HHPT was recommended last admission; He did not give this PT the opportunity to discuss if he wants HHPT)    Assistance Recommended at Discharge Intermittent Supervision/Assistance  Patient can return home with the following  A little help with walking and/or transfers;Assist for transportation;Assistance with cooking/housework    Equipment Recommendations None  recommended by PT  Recommendations for Other Services       Functional Status Assessment  (Difficult to discern as pt was resistent to talking much about PLOF since last admission)     Precautions / Restrictions Precautions Precautions: Fall Precaution Comments: Poor safety awareness      Mobility  Bed Mobility Overal bed mobility: Needs Assistance Bed Mobility: Supine to Sit     Supine to sit: Supervision     General bed mobility comments: Used bedrails    Transfers Overall transfer level: Needs assistance Equipment used: None Transfers: Sit to/from Stand Sit to Stand: Supervision           General transfer comment: no physical assist needed; noted dependence on UE suport for staediness    Ambulation/Gait Ambulation/Gait assistance: Supervision Gait Distance (Feet): 8 Feet Assistive device: None Gait Pattern/deviations: Step-through pattern       General Gait Details: Reaching out for UE support as he walked to the bathroom; held his condom cath bag  Stairs            Wheelchair Mobility    Modified Rankin (Stroke Patients Only)       Balance Overall balance assessment: Mild deficits observed, not formally tested                                           Pertinent Vitals/Pain Pain Assessment Pain Assessment: No/denies pain    Home Living Family/patient expects to be discharged to:: Private residence Living  Arrangements: Spouse/significant other;Children;Other relatives Available Help at Discharge: Family;Available 24 hours/day Type of Home: House Home Access: Ramped entrance       Home Layout: One level Home Equipment: Conservation officer, nature (2 wheels);Cane - single point;Wheelchair - power      Prior Function Prior Level of Function : Needs assist             Mobility Comments: furniture walks for household distances, uses power w/c for community distances       Hand Dominance   Dominant Hand: Right     Extremity/Trunk Assessment   Upper Extremity Assessment Upper Extremity Assessment: Defer to OT evaluation    Lower Extremity Assessment Lower Extremity Assessment: Generalized weakness       Communication   Communication: No difficulties  Cognition Arousal/Alertness: Awake/alert Behavior During Therapy: Restless, Impulsive Overall Cognitive Status: Difficult to assess                                 General Comments: pt refusing mobility at this time, states he will get OOB when he wants to; Agrees to getting limited orthsotatic vitals, unable to get 3 minute standing BP as he went into the bathroom        General Comments General comments (skin integrity, edema, etc.): No reports of dizziness    Exercises     Assessment/Plan    PT Assessment Patient does not need any further PT services  PT Problem List Decreased strength;Decreased activity tolerance;Decreased balance;Decreased mobility       PT Treatment Interventions      PT Goals (Current goals can be found in the Care Plan section)  Acute Rehab PT Goals Patient Stated Goal: go home PT Goal Formulation: All assessment and education complete, DC therapy    Frequency       Co-evaluation               AM-PAC PT "6 Clicks" Mobility  Outcome Measure Help needed turning from your back to your side while in a flat bed without using bedrails?: None Help needed moving from lying on your back to sitting on the side of a flat bed without using bedrails?: None Help needed moving to and from a bed to a chair (including a wheelchair)?: None Help needed standing up from a chair using your arms (e.g., wheelchair or bedside chair)?: A Little Help needed to walk in hospital room?: A Little Help needed climbing 3-5 steps with a railing? : A Lot 6 Click Score: 20    End of Session   Activity Tolerance: Other (comment) (self limiting) Patient left: with call bell/phone within reach (in  bathroom) Nurse Communication: Mobility status (Orthostatic BPs) PT Visit Diagnosis: Difficulty in walking, not elsewhere classified (R26.2);Dizziness and giddiness (R42)    Time: 0962-8366 PT Time Calculation (min) (ACUTE ONLY): 10 min   Charges:   PT Evaluation $PT Eval Low Complexity: [place], PT  Acute Rehabilitation Services Pager [phone] Office [phone]   Colletta [place] 02/16/2022, 12:17 PM

## 2022-02-16 NOTE — Care Management Important Message (Signed)
Important Message  Patient Details  Name: Russell [name] MRN: [id number] Date of Birth: [birth year]   Medicare Important Message Given:  Yes     Orbie Pyo 02/16/2022, 2:51 PM

## 2022-02-16 NOTE — Discharge Summary (Signed)
[facility] Discharge Summary  Patient name: Russell [name] Medical record number: [id number] Date of birth: [birth year] Age: 81 y.o. Gender: male Date of Admission: 02/13/2022  Date of Discharge: 02/16/2022 Admitting Physician: Blane Ohara McDiarmid, MD  Primary Care Provider: Marcie Mowers, FNP Consultants: None  Indication for Hospitalization: Possible syncopal episode resulting in fall  Discharge Diagnoses/Problem List:  Principal Problem:   Syncope Active Problems:   HYPERCHOLESTEROLEMIA, MIXED   ANEMIA, IRON DEFICIENCY NOS   TOBACCO ABUSE   CATARACTS, BILATERAL   Essential hypertension   G E R D   DIZZINESS   DM type 2 (diabetes mellitus, type 2) (HCC)   Anemia of renal disease   Peripheral arterial disease ([place])   Acute renal failure superimposed on stage 3b chronic kidney disease ([place])   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 male presenting after a fall. PMH is significant for DM type II, subdural hematomas with frequent falls, history of alcohol use disorder, hypertension, PAD, COPD, CKD 3B.  Syncopal episode, likely secondary to orthostasis and decreased p.o. intake Patient admitted after syncopal episode with possible head injury, found to by hypotensive by EMS and patient reported decreased p.o. intake for 3 days. CT head and cervical spine negative.  Patient normotensive prior to discharge.  Iron deficiency anemia Patient received IV Venofer and had a total of 2 blood transfusions with 1  unit pRBCs during his stay.  Patient has transfusion threshold of 8 and had hemoglobin of 8.9 on day of discharge.  Home Plavix for PAD was held due to possibility of bleeding and was also held at discharge.  UTI Patient found to have signs of UTI on urinalysis. Patient was treated with fosfomycin 3g once.   AKI on CKD 3B Creatinine elevated to 2.25 on admission, likely multifactorial with decreased intake and hypotensive episodes. Patient received 2L fluid boluses in the ER and creatinine was down to 1.5 the day before discharge.   Constipation Reported 10 days without bowel movement. CT abdomen/pelvis with no acute findings. Aggressive bowel regimen was initiated on admission with subsequent bowel movements.    All other chronic conditions were stable and home medications were continued.    Issues for follow-up: Patient may benefit from hem/onc outpatient.  Plavix held on discharge for risk of bleeding given history of anemia secondary to IDA. Sent home on oral iron every other day.  Repeat CBC at hospital follow up.  Held blood pressure medications given hypotensive episodes on admission. These meds were also held on discharge, recheck BP and restart as appropriate.    Significant Procedures: Blood transfusion with 1 unit PRBCs on 2/25 and 2/26  Significant Labs and Imaging:  Recent Labs  Lab 02/14/22 1058 02/15/22 0341 02/15/22 1835 02/16/22 0933  WBC 8.0 8.8  --  10.2  HGB 7.0*   7.0* 7.8* 9.0* 8.9*  HCT 22.6*   22.9* 24.7* 28.8* 29.5*  PLT 473* 405*  --  414*   Recent Labs  Lab 02/13/22 1425 02/14/22 0248 02/15/22 0341  NA 134* 134* 138  K  3.9 3.9 4.1  CL 99 102 106  CO2 23 24 21*  GLUCOSE 123* 98 106*  BUN 51* 37* 26*  CREATININE 2.25* 1.63* 1.50*  CALCIUM 9.5 8.9 8.8*  ALKPHOS 47  --   --   AST 11*  --   --   ALT 16  --   --   ALBUMIN 4.1  --   --       Results/Tests Pending at Time of Discharge: None  Discharge Medications:  Allergies as of  02/16/2022       Reactions   Tramadol    Confusion (intolerance)        Medication List     STOP taking these medications    clopidogrel 75 MG tablet Commonly known as: PLAVIX   losartan 100 MG tablet Commonly known as: COZAAR   oxyCODONE-acetaminophen 10-325 MG tablet Commonly known as: Percocet       TAKE these medications    acetaminophen 325 MG tablet Commonly known as: TYLENOL Take 2 tablets (650 mg total) by mouth every 4 (four) hours as needed for mild pain (or temp > 37.5 C (99.5 F)).   bimatoprost 0.01 % Soln Commonly known as: LUMIGAN Place 1 drop into both eyes at bedtime.   brimonidine 0.2 % ophthalmic solution Commonly known as: ALPHAGAN Place 1 drop into both eyes 3 (three) times daily.   FeroSul 325 (65 FE) MG tablet Generic drug: ferrous sulfate Take 1 tablet (325 mg total) by mouth every other day for 100 doses.   gabapentin 300 MG capsule Commonly known as: NEURONTIN Take 300 mg by mouth daily.   multivitamin with minerals Tabs tablet Take 1 tablet by mouth daily. One a day   Myrbetriq 25 MG Tb24 tablet Generic drug: mirabegron ER Take 25 mg by mouth daily.   pantoprazole 40 MG tablet Commonly known as: PROTONIX Take 1 tablet (40 mg total) by mouth daily.   pravastatin 80 MG tablet Commonly known as: PRAVACHOL Take 80 mg by mouth daily.   Simbrinza 1-0.2 % Susp Generic drug: Brinzolamide-Brimonidine Place 1 drop into both eyes 3 (three) times daily.        Discharge Instructions: Please refer to Patient Instructions section of EMR for full details.  Patient was counseled important signs and symptoms that should prompt return to medical care, changes in medications, dietary instructions, activity restrictions, and follow up appointments.   Follow-Up Appointments:  Follow-up Information     Marcie Mowers, FNP. Schedule an appointment as soon as possible for a visit.   Specialty: Family Medicine Why: Please make an  appointment within 1 week after discharge from the hospital. Contact information: [address] [address] [phone]                 Precious Gilding, DO 02/16/2022, 12:13 PM PGY-1, [facility]

## 2022-02-16 NOTE — Progress Notes (Signed)
DISCHARGE NOTE HOME Russell MOLYNEUX to be discharged Home per MD order. Discussed prescriptions and follow up appointments with the patient. Prescriptions given to patient; medication list explained in detail. Patient verbalized understanding.  Skin clean, dry and intact without evidence of skin break down, no evidence of skin tears noted. IV catheter discontinued intact. Site without signs and symptoms of complications. Dressing and pressure applied. Pt denies pain at the site currently. No complaints noted.  Patient free of lines, drains, and wounds.   An After Visit Summary (AVS) was printed and given to the patient. Patient escorted via wheelchair, and discharged home via private auto.  Arlyss Repress, RN

## 2022-02-16 NOTE — TOC Transition Note (Signed)
Transition of Care [facility]) - CM/SW Discharge Note   Patient Details  Name: Russell [name] MRN: [id number] Date of Birth: [birth year]  Transition of Care [facility]) CM/SW Contact:  Tom-Johnson, Renea Ee, RN Phone Number: 02/16/2022, 2:54 PM   Clinical Narrative:     Patient is scheduled for discharge today. Admitted for Syncope. From home with wife and has a daughter. Has a cane,walker and shower seat at home. Home health PT recommended and patient declined. Education done. PCP is [place], Garen Lah, FNP and uses [facility] on [address]. Wife to transport at discharge. No further TOC needs noted.   Final next level of care: Home/Self Care Barriers to Discharge: Barriers Resolved   Patient Goals and CMS Choice Patient states their goals for this hospitalization and ongoing recovery are:: Return home CMS Medicare.gov Compare Post Acute Care list provided to:: Patient (Declined) Choice offered to / list presented to : Patient (Declined)  Discharge Placement                       Discharge Plan and Services                DME Arranged: N/A DME Agency: NA       HH Arranged: Patient Refused Trail Agency: NA        Social Determinants of Health ([place]) Interventions     Readmission Risk Interventions Readmission Risk Prevention Plan 02/03/2022  Transportation Screening (No Data)  Some recent data might be hidden

## 2022-02-16 NOTE — Discharge Instructions (Signed)
You were hospitalized at [facility] due to weakness.  We expect this is from low hemoglobin or anemia  which improved after blood transfusion and monitoring.  We are so glad you are feeling better.  Be sure to follow-up with your regularly scheduled appointments.  Please also be sure to follow-up with your primary care doctor within a week after discharge at your earliest convenience.  Please make sure to take all your medications as prescribed, do not take plavix without talking to your doctor. Thank you for allowing [place] to be a part of your medical care.  Take care, [facility] family medicine team

## 2022-02-17 ENCOUNTER — Ambulatory Visit (INDEPENDENT_AMBULATORY_CARE_PROVIDER_SITE_OTHER): Payer: Medicare Other | Admitting: Podiatry

## 2022-02-17 ENCOUNTER — Other Ambulatory Visit: Payer: Self-pay | Admitting: Podiatry

## 2022-02-17 ENCOUNTER — Encounter: Payer: Self-pay | Admitting: Podiatry

## 2022-02-17 ENCOUNTER — Other Ambulatory Visit: Payer: Self-pay

## 2022-02-17 DIAGNOSIS — G5782 Other specified mononeuropathies of left lower limb: Secondary | ICD-10-CM

## 2022-02-17 DIAGNOSIS — M778 Other enthesopathies, not elsewhere classified: Secondary | ICD-10-CM

## 2022-02-17 LAB — PATHOLOGIST SMEAR REVIEW

## 2022-02-17 MED ORDER — OXYCODONE-ACETAMINOPHEN 10-325 MG PO TABS
1.0000 | ORAL_TABLET | Freq: Three times a day (TID) | ORAL | 0 refills | Status: AC | PRN
Start: 2022-02-17 — End: 2022-02-24

## 2022-02-17 MED ORDER — OXYCODONE-ACETAMINOPHEN 10-325 MG PO TABS: 1.0000 | ORAL_TABLET | ORAL | 0 refills | Status: DC | PRN

## 2022-02-17 MED ORDER — TRIAMCINOLONE ACETONIDE 40 MG/ML IJ SUSP
20.0000 mg | Freq: Once | INTRAMUSCULAR | Status: AC
  Administered 2022-02-17: 20 mg

## 2022-02-17 NOTE — Progress Notes (Signed)
He presents today for follow-up of his neuroma states I really need that injection in the third place of years he points to the between the third and fourth toe he is also complaining of pain between the first and second toe at this point states that may be the arthritis associated with where he broke his foot dropping transmission on it.  Objective: Vital signs are stable he is alert and oriented x3.  Pulses are nonpalpable left.  Cool foot he still has pain on palpation to the proximal third interdigital space.  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  Plan: I injected the capsulitis around the first and second metatarsophalangeal joints today with 10 mg of Kenalog also injected 4% dehydrated alcohol to the third interspace of the left foot.  Debrided reactive hyperkeratotic lesion plantar aspect of his left foot.

## 2022-02-18 ENCOUNTER — Telehealth: Payer: Self-pay | Admitting: Podiatry

## 2022-02-18 NOTE — Telephone Encounter (Signed)
Patient called pain medication was called into the wrong pharmacy it should have been called into [facility] on [address]

## 2022-02-19 ENCOUNTER — Other Ambulatory Visit: Payer: Self-pay | Admitting: Vascular Surgery

## 2022-02-19 ENCOUNTER — Other Ambulatory Visit: Payer: Self-pay | Admitting: *Deleted

## 2022-02-19 NOTE — Telephone Encounter (Signed)
Patient Is calling for status of a pain medicine refill. Called patient to notify that his pain medicine is at pharmacy([facility], ready for pick up). ?

## 2022-02-20 MED ORDER — OXYCODONE-ACETAMINOPHEN 10-325 MG PO TABS: 1.0000 | ORAL_TABLET | Freq: Four times a day (QID) | ORAL | 0 refills | Status: AC | PRN

## 2022-02-20 NOTE — Telephone Encounter (Signed)
I called patient to explain that pain medication has been sent to [facility], no answer, could not leave a vmessage.

## 2022-02-20 NOTE — Telephone Encounter (Signed)
Patient called again needs his pain medication switched from [facility] on [address] to [facility] on [address]

## 2022-03-17 ENCOUNTER — Ambulatory Visit: Payer: Medicare Other | Admitting: Podiatry

## 2022-03-17 ENCOUNTER — Other Ambulatory Visit: Payer: Self-pay | Admitting: Podiatry

## 2022-03-17 ENCOUNTER — Telehealth: Payer: Self-pay | Admitting: Podiatry

## 2022-03-17 MED ORDER — OXYCODONE-ACETAMINOPHEN 10-325 MG PO TABS
1.0000 | ORAL_TABLET | Freq: Three times a day (TID) | ORAL | 0 refills | Status: AC | PRN
Start: 2022-03-17 — End: 2022-03-24

## 2022-03-17 NOTE — Telephone Encounter (Signed)
Medication sent to the pharmacy.

## 2022-03-17 NOTE — Telephone Encounter (Signed)
Patient requesting a refill on his pain medication oxyCODONE-acetaminophen (PERCOCET) 10-325 MG tablet to be sent to [place], [place] AT [facility]

## 2022-03-18 NOTE — Telephone Encounter (Signed)
Patient called to check to see if medication was called in  - notified patient that medication as called into [facility] on [address]

## 2022-03-31 ENCOUNTER — Ambulatory Visit: Payer: Medicare Other | Admitting: Podiatry

## 2022-03-31 ENCOUNTER — Telehealth: Payer: Self-pay | Admitting: Podiatry

## 2022-03-31 NOTE — Telephone Encounter (Signed)
Patient called and requested refill on his pain medication oxyCODONE-acetaminophen (PERCOCET) 10-325 MG tablet    [place], [address] AT [facility]

## 2022-04-06 NOTE — Telephone Encounter (Signed)
Please advise 

## 2022-04-07 ENCOUNTER — Other Ambulatory Visit: Payer: Self-pay | Admitting: Podiatry

## 2022-04-07 MED ORDER — OXYCODONE-ACETAMINOPHEN 10-325 MG PO TABS: 1.0000 | ORAL_TABLET | Freq: Three times a day (TID) | ORAL | 0 refills | Status: AC | PRN

## 2022-04-14 ENCOUNTER — Ambulatory Visit (INDEPENDENT_AMBULATORY_CARE_PROVIDER_SITE_OTHER): Payer: Medicare Other | Admitting: Podiatry

## 2022-04-14 DIAGNOSIS — G5782 Other specified mononeuropathies of left lower limb: Secondary | ICD-10-CM | POA: Diagnosis not present

## 2022-04-14 DIAGNOSIS — B351 Tinea unguium: Secondary | ICD-10-CM | POA: Diagnosis not present

## 2022-04-14 DIAGNOSIS — D367 Benign neoplasm of other specified sites: Secondary | ICD-10-CM | POA: Diagnosis not present

## 2022-04-14 DIAGNOSIS — M79676 Pain in unspecified toe(s): Secondary | ICD-10-CM | POA: Diagnosis not present

## 2022-04-14 MED ORDER — OXYCODONE-ACETAMINOPHEN 10-325 MG PO TABS: 1.0000 | ORAL_TABLET | Freq: Three times a day (TID) | ORAL | 0 refills | Status: AC | PRN

## 2022-04-14 NOTE — Progress Notes (Signed)
He presents today chief complaint of painful elongated toenails and calluses.  He is also complaining of the neuroma to the third interdigital space of the left foot.  And he is requesting his pain medication. ? ?Objective: Vital signs are stable alert oriented x3.  Pulses are minimally palpable.  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  Painful neuroma or stump neuroma to the third interdigital space of the left foot. ? ?Assessment: Pain in limb secondary to onychomycosis benign skin lesions and neuroma. ? ?Plan: Debridement of toenails 1 through 5 bilaterally secondary to pain.  Debrided benign skin lesions of fifth bilateral no iatrogenic lesions noted.  I injected dehydrated alcohol to the third interdigital space of the left foot today.  Tolerated procedure well without complications follow-up with him in 3 weeks. ?

## 2022-04-22 ENCOUNTER — Other Ambulatory Visit: Payer: Self-pay | Admitting: *Deleted

## 2022-04-22 MED ORDER — CLOPIDOGREL BISULFATE 75 MG PO TABS: 75.0000 mg | ORAL_TABLET | Freq: Every day | ORAL | 3 refills | Status: DC

## 2022-05-04 ENCOUNTER — Encounter (HOSPITAL_COMMUNITY): Payer: Self-pay

## 2022-05-04 ENCOUNTER — Emergency Department (HOSPITAL_COMMUNITY)
Admission: EM | Admit: 2022-05-04 | Discharge: 2022-05-04 | Discharge status: Against Medical Advice | Payer: Medicare Other | Attending: Emergency Medicine | Admitting: Emergency Medicine

## 2022-05-04 ENCOUNTER — Other Ambulatory Visit: Payer: Self-pay

## 2022-05-04 DIAGNOSIS — R42 Dizziness and giddiness: Secondary | ICD-10-CM | POA: Insufficient documentation

## 2022-05-04 DIAGNOSIS — Z5321 Procedure and treatment not carried out due to patient leaving prior to being seen by health care provider: Secondary | ICD-10-CM | POA: Insufficient documentation

## 2022-05-04 DIAGNOSIS — R531 Weakness: Secondary | ICD-10-CM | POA: Insufficient documentation

## 2022-05-04 LAB — COMPREHENSIVE METABOLIC PANEL
ALT: 25 U/L (ref 0–44)
AST: 27 U/L (ref 15–41)
Albumin: 3.7 g/dL (ref 3.5–5.0)
Alkaline Phosphatase: 40 U/L (ref 38–126)
Anion gap: 8 (ref 5–15)
BUN: 37 mg/dL — ABNORMAL HIGH (ref 8–23)
CO2: 24 mmol/L (ref 22–32)
Calcium: 9 mg/dL (ref 8.9–10.3)
Chloride: 105 mmol/L (ref 98–111)
Creatinine, Ser: 1.75 mg/dL — ABNORMAL HIGH (ref 0.61–1.24)
GFR, Estimated: 39 mL/min — ABNORMAL LOW (ref >60)
Glucose, Bld: 91 mg/dL (ref 70–99)
Potassium: 4.4 mmol/L (ref 3.5–5.1)
Sodium: 137 mmol/L (ref 135–145)
Total Bilirubin: 0.6 mg/dL (ref 0.3–1.2)
Total Protein: 6.7 g/dL (ref 6.5–8.1)

## 2022-05-04 LAB — CBC WITH DIFFERENTIAL/PLATELET
Abs Immature Granulocytes: 0.02 10*3/uL (ref 0.00–0.07)
Basophils Absolute: 0 10*3/uL (ref 0.0–0.1)
Basophils Relative: 1 %
Eosinophils Absolute: 0.1 10*3/uL (ref 0.0–0.5)
Eosinophils Relative: 2 %
HCT: 28.5 % — ABNORMAL LOW (ref 39.0–52.0)
Hemoglobin: 7.9 g/dL — ABNORMAL LOW (ref 13.0–17.0)
Immature Granulocytes: 0 %
Lymphocytes Relative: 26 %
Lymphs Abs: 1.7 10*3/uL (ref 0.7–4.0)
MCH: 25.2 pg — ABNORMAL LOW (ref 26.0–34.0)
MCHC: 27.7 g/dL — ABNORMAL LOW (ref 30.0–36.0)
MCV: 91.1 fL (ref 80.0–100.0)
Monocytes Absolute: 0.5 10*3/uL (ref 0.1–1.0)
Monocytes Relative: 7 %
Neutro Abs: 4.2 10*3/uL (ref 1.7–7.7)
Neutrophils Relative %: 64 %
Platelets: 152 10*3/uL (ref 150–400)
RBC: 3.13 MIL/uL — ABNORMAL LOW (ref 4.22–5.81)
RDW: 18.7 % — ABNORMAL HIGH (ref 11.5–15.5)
WBC: 6.6 10*3/uL (ref 4.0–10.5)
nRBC: 0 % (ref 0.0–0.2)

## 2022-05-04 LAB — MAGNESIUM: Magnesium: 2.8 mg/dL — ABNORMAL HIGH (ref 1.7–2.4)

## 2022-05-04 NOTE — ED Provider Triage Note (Signed)
Emergency Medicine Provider Triage Evaluation Note ? ?Russell [name] , a 81 y.o. male  was evaluated in triage.  Pt complains of dizziness and weakness onset prior to arrival. Patient notes that he takes medications for high blood pressure and takes them every morning with his last dose being this morning. Denies chest pain, shortness of breath, abdominal pain, nausea, vomiting, dysuria, hematuria. ? ?Review of Systems  ?Positive: As per HPI above ?Negative:  ? ?Physical Exam  ?BP (!) 101/54   Pulse 88   Temp 98.7 ?F (37.1 ?C) (Oral)   Resp 16   Ht '6\' 2"'$  (1.88 m)   Wt 68.9 kg   SpO2 100%   BMI 19.52 kg/m?  ?Gen:   Awake, no distress   ?Resp:  Normal effort  ?MSK:   Moves extremities without difficulty  ?Other:   ? ?Medical Decision Making  ?Medically screening exam initiated at 2:41 PM.  Appropriate orders placed.  Russell [name] was informed that the remainder of the evaluation will be completed by another provider, this initial triage assessment does not replace that evaluation, and the importance of remaining in the ED until their evaluation is complete. ? ?Work-up initiated. ?2:42 PM - Discussed with RN that patient is in need of a room immediately. RN aware and working on room placement.  ? ?  ?Analyssa [place] A, PA-C ?05/04/22 1442 ? ?

## 2022-05-04 NOTE — ED Notes (Signed)
Pt refusing blood work until he's seen by provider ?

## 2022-05-04 NOTE — ED Notes (Signed)
Pt reports he is going outside to get some air. Pt made aware that if he goes outside his IV will need to be removed and he will have to check back in. Pt reports "yall have done nothing but take my blood like you always do. I want to leave." Pt educated on risks of leaving. Pt reports he still wants to leave.  ?

## 2022-05-04 NOTE — ED Triage Notes (Signed)
Per EMS- Patient is from home. Patient c/o  dizziness and weakness. Initial BP-88/50 and 8440 standing. ?EMS gave 500 ml NS and 150 ml LR prior to arrival. ?

## 2022-05-21 ENCOUNTER — Other Ambulatory Visit: Payer: Self-pay | Admitting: Podiatry

## 2022-05-21 ENCOUNTER — Encounter: Payer: Self-pay | Admitting: Podiatry

## 2022-05-21 ENCOUNTER — Ambulatory Visit (INDEPENDENT_AMBULATORY_CARE_PROVIDER_SITE_OTHER): Payer: Medicare Other | Admitting: Podiatry

## 2022-05-21 DIAGNOSIS — G5782 Other specified mononeuropathies of left lower limb: Secondary | ICD-10-CM

## 2022-05-21 MED ORDER — OXYCODONE-ACETAMINOPHEN 10-325 MG PO TABS: 1.0000 | ORAL_TABLET | Freq: Three times a day (TID) | ORAL | 0 refills | Status: AC | PRN

## 2022-05-23 NOTE — Progress Notes (Signed)
He presents today for follow-up of his stump neuroma third interspace left foot.  Peripheral vascular disease.  States that it still hurts.  Objective: Vital signs are stable he is alert and oriented x3.  Pulses are nonpalpable.  Feet are cool to the touch.  Pain on palpation third interdigital space.  Assessment: Pain in limb secondary to peripheral vascular disease and stump neuroma third interspace left foot.  Plan: Another dose of dehydrated alcohol to alleviate symptoms for few weeks and I did send over another 7 days of Percocet for him.  Follow-up with him in 1 month.

## 2022-06-01 ENCOUNTER — Other Ambulatory Visit: Payer: Self-pay | Admitting: Vascular Surgery

## 2022-07-07 ENCOUNTER — Ambulatory Visit (INDEPENDENT_AMBULATORY_CARE_PROVIDER_SITE_OTHER): Payer: Medicare Other | Admitting: Podiatry

## 2022-07-07 ENCOUNTER — Encounter: Payer: Self-pay | Admitting: Podiatry

## 2022-07-07 ENCOUNTER — Other Ambulatory Visit: Payer: Self-pay | Admitting: Podiatry

## 2022-07-07 DIAGNOSIS — G5782 Other specified mononeuropathies of left lower limb: Secondary | ICD-10-CM

## 2022-07-07 MED ORDER — OXYCODONE-ACETAMINOPHEN 10-325 MG PO TABS: 1.0000 | ORAL_TABLET | Freq: Three times a day (TID) | ORAL | 0 refills | Status: AC | PRN

## 2022-07-07 MED ORDER — TRIAMCINOLONE ACETONIDE 40 MG/ML IJ SUSP
20.0000 mg | Freq: Once | INTRAMUSCULAR | Status: AC
  Administered 2022-07-07: 20 mg

## 2022-07-07 NOTE — Progress Notes (Unsigned)
per

## 2022-07-07 NOTE — Progress Notes (Signed)
Presents today for follow-up of neuroma third interspace of the left foot.  States that it is really been giving her some grief lately feels like it starting to run appear as he points to the mid diaphyseal region of the third interdigital space third intermetatarsal space.  Objective: Vital signs are stable pulses are minimally palpable.  There are no open lesions or wounds in the foot.  He does have interdigital and intermetatarsal pain most likely contracted stump neuroma.  Assessment: Neuroma.  Plan: At this point since the entire foot and that area was sore I put some Kenalog in their 10 mg with local anesthetic.  We will follow-up with him in a month or so and I did send over Percocet for 7 days.

## 2022-07-22 ENCOUNTER — Encounter (HOSPITAL_COMMUNITY): Payer: Self-pay | Admitting: Emergency Medicine

## 2022-07-22 ENCOUNTER — Emergency Department (HOSPITAL_COMMUNITY): Payer: Medicare Other

## 2022-07-22 ENCOUNTER — Emergency Department (HOSPITAL_COMMUNITY)
Admission: EM | Admit: 2022-07-22 | Discharge: 2022-07-23 | Disposition: A | Discharge status: Home / Self Care | Payer: Medicare Other | Attending: Emergency Medicine | Admitting: Emergency Medicine

## 2022-07-22 DIAGNOSIS — Z7901 Long term (current) use of anticoagulants: Secondary | ICD-10-CM | POA: Diagnosis not present

## 2022-07-22 DIAGNOSIS — R7989 Other specified abnormal findings of blood chemistry: Secondary | ICD-10-CM | POA: Insufficient documentation

## 2022-07-22 DIAGNOSIS — D649 Anemia, unspecified: Secondary | ICD-10-CM | POA: Diagnosis not present

## 2022-07-22 LAB — CBC WITH DIFFERENTIAL/PLATELET
Abs Immature Granulocytes: 0.02 10*3/uL (ref 0.00–0.07)
Basophils Absolute: 0.1 10*3/uL (ref 0.0–0.1)
Basophils Relative: 1 %
Eosinophils Absolute: 0.2 10*3/uL (ref 0.0–0.5)
Eosinophils Relative: 2 %
HCT: 26.1 % — ABNORMAL LOW (ref 39.0–52.0)
Hemoglobin: 7.8 g/dL — ABNORMAL LOW (ref 13.0–17.0)
Immature Granulocytes: 0 %
Lymphocytes Relative: 31 %
Lymphs Abs: 2.5 10*3/uL (ref 0.7–4.0)
MCH: 24.4 pg — ABNORMAL LOW (ref 26.0–34.0)
MCHC: 29.9 g/dL — ABNORMAL LOW (ref 30.0–36.0)
MCV: 81.6 fL (ref 80.0–100.0)
Monocytes Absolute: 0.6 10*3/uL (ref 0.1–1.0)
Monocytes Relative: 8 %
Neutro Abs: 4.8 10*3/uL (ref 1.7–7.7)
Neutrophils Relative %: 58 %
Platelets: 408 10*3/uL — ABNORMAL HIGH (ref 150–400)
RBC: 3.2 MIL/uL — ABNORMAL LOW (ref 4.22–5.81)
RDW: 19 % — ABNORMAL HIGH (ref 11.5–15.5)
WBC: 8.3 10*3/uL (ref 4.0–10.5)
nRBC: 0 % (ref 0.0–0.2)

## 2022-07-22 LAB — COMPREHENSIVE METABOLIC PANEL
ALT: 14 U/L (ref 0–44)
AST: 12 U/L — ABNORMAL LOW (ref 15–41)
Albumin: 4.3 g/dL (ref 3.5–5.0)
Alkaline Phosphatase: 36 U/L — ABNORMAL LOW (ref 38–126)
Anion gap: 11 (ref 5–15)
BUN: 26 mg/dL — ABNORMAL HIGH (ref 8–23)
CO2: 19 mmol/L — ABNORMAL LOW (ref 22–32)
Calcium: 9.5 mg/dL (ref 8.9–10.3)
Chloride: 109 mmol/L (ref 98–111)
Creatinine, Ser: 1.89 mg/dL — ABNORMAL HIGH (ref 0.61–1.24)
GFR, Estimated: 35 mL/min — ABNORMAL LOW (ref >60)
Glucose, Bld: 99 mg/dL (ref 70–99)
Potassium: 4.4 mmol/L (ref 3.5–5.1)
Sodium: 139 mmol/L (ref 135–145)
Total Bilirubin: 0.6 mg/dL (ref 0.3–1.2)
Total Protein: 7.2 g/dL (ref 6.5–8.1)

## 2022-07-22 LAB — TYPE AND SCREEN
ABO/RH(D): O POS
Antibody Screen: NEGATIVE

## 2022-07-22 LAB — PROTIME-INR
INR: 1 (ref 0.8–1.2)
Prothrombin Time: 13.5 seconds (ref 11.4–15.2)

## 2022-07-22 LAB — APTT: aPTT: 27 seconds (ref 24–36)

## 2022-07-22 LAB — POC OCCULT BLOOD, ED
Fecal Occult Bld: NEGATIVE
Fecal Occult Bld: NEGATIVE

## 2022-07-22 MED ORDER — SODIUM CHLORIDE 0.9 % IV BOLUS
500.0000 mL | Freq: Once | INTRAVENOUS | Status: AC
  Administered 2022-07-22: 500 mL via INTRAVENOUS

## 2022-07-22 NOTE — ED Notes (Signed)
Only able to obtain type and screen in triage.

## 2022-07-22 NOTE — ED Provider Triage Note (Signed)
Emergency Medicine Provider Triage Evaluation Note  Russell [name] , a 81 y.o. male  was evaluated in triage.  Pt complains of low blood.  Patient reports that he was called today by his primary care doctor and was told to come in for a low blood count.  Patient is unsure of how low it is.  Patient reports has been feeling lightheaded otherwise denies any black stools, current chest pain, or shortness of breath.  Appears his last hemoglobin was around 7 2 weeks ago.  Review of Systems  Positive:   Negative:    Physical Exam  BP 98/62 (BP Location: Right Arm)   Pulse (!) 55   Temp 98.3 F (36.8 C) (Oral)   Resp 15   SpO2 94%  Gen:   Awake, no distress    Resp:  Normal effort   MSK:   Moves extremities without difficulty   Other:  Patient in no acute distress.  Medical Decision Making  Medically screening exam initiated at 2:01 PM.  Appropriate orders placed.  Russell [name] was informed that the remainder of the evaluation will be completed by another provider, this initial triage assessment does not replace that evaluation, and the importance of remaining in the ED until their evaluation is complete.  Labs and type and screen ordered   Russell Puller, PA-C 07/22/22 1402

## 2022-07-22 NOTE — ED Triage Notes (Signed)
Patient sent to ED by PCP for evaluation of anemia. Patient denies known source of bleeding, is alert, oriented, and in no apparent distress at this time.

## 2022-07-22 NOTE — ED Provider Notes (Incomplete)
[facility] EMERGENCY DEPARTMENT Provider Note   CSN: [id number] Arrival date & time: 07/22/22  1301     History  Chief Complaint  Patient presents with  . Anemia    Russell [name] is a 81 y.o. male with a past medical history of iron deficiency anemia, diverticulosis, who presents to the emergency department with concerns for anemia.  Patient was called by his primary care provider to come into the ED for further evaluation due to low blood count on labs.  Patient has associated lightheadedness and shortness of breath.  No history of iron deficiency anemia.  No iron supplementation prescribed in the past.  Denies chest pain, shortness of breath, dizziness, palpitations, hemoptysis, hematochezia, hematemesis.  Patient last had a colonoscopy approximately 6-7 years.  No GI doctor at this time.   Per patient chart review: Patient was evaluated by his primary care provider on 07/09/2022 for concerns for dizziness.  Patient was sent a prescription for meclizine as well as Flomax.  Blood pressure at that time was 100/55.  At that time labs were sent out and hemoglobin was at 7.  Patient was also instructed to check his blood pressure daily.  Patient was advised to stop taking his hydrochlorothiazide due to orthostatic blood pressure.  Patient has a follow-up appointment with his primary care provider in 1-2 weeks.  At that time and suggested that patient have home health PT/OT however it was noted that patient had declined that on previous visits.  According to patient provider note, will be suggested on next visit.  Also encourage patient use of assistive device due to dizzy spells.     The history is provided by the patient. No language interpreter was used.       Home Medications Prior to Admission medications   Medication Sig Start Date End Date Taking? Authorizing Provider  acetaminophen (TYLENOL) 325 MG tablet Take 2 tablets (650 mg total) by mouth every 4 (four)  hours as needed for mild pain (or temp > 37.5 C (99.5 F)). 07/10/21   Samuella Cota, MD  ALPHAGAN P 0.1 % SOLN Place 1 drop into the right eye 3 (three) times daily. 03/23/22   [provider]  bimatoprost (LUMIGAN) 0.01 % SOLN Place 1 drop into both eyes at bedtime. 04/10/13   Dhungel, Nishant, MD  brimonidine (ALPHAGAN) 0.2 % ophthalmic solution Place 1 drop into both eyes 3 (three) times daily. 03/14/19   [provider]  Brinzolamide-Brimonidine (SIMBRINZA) 1-0.2 % SUSP Place 1 drop into both eyes 3 (three) times daily.    [provider]  clopidogrel (PLAVIX) 75 MG tablet TAKE 1 TABLET BY MOUTH EVERY DAY 06/01/22   Waynetta Sandy, MD  ferrous sulfate 325 (65 FE) MG tablet Take 1 tablet (325 mg total) by mouth every other day for 100 doses. 02/16/22 09/04/22  Precious Gilding, DO  gabapentin (NEURONTIN) 300 MG capsule Take 300 mg by mouth daily. 02/25/21   [provider]  losartan (COZAAR) 100 MG tablet Take 100 mg by mouth daily. 04/13/22   [provider]  Multiple Vitamin (MULTIVITAMIN WITH MINERALS) TABS tablet Take 1 tablet by mouth daily. One a day    [provider]  MYRBETRIQ 25 MG TB24 tablet Take 25 mg by mouth daily. 02/12/20   [provider]  oxyCODONE-acetaminophen (PERCOCET) 10-325 MG tablet Take 1 tablet by mouth every 4 (four) hours as needed for pain. 02/17/22   Hyatt, Max T, DPM  pantoprazole (PROTONIX)  40 MG tablet Take 1 tablet (40 mg total) by mouth daily. 02/04/22 03/06/22  Gerrit Heck, MD  pravastatin (PRAVACHOL) 80 MG tablet Take 80 mg by mouth daily.  10/19/16   [provider]      Allergies    Tramadol    Review of Systems   Review of Systems  Respiratory:  Positive for shortness of breath (x 1-2 weeks).   Cardiovascular:  Negative for chest pain.  Gastrointestinal:  Positive for abdominal pain (intermittent LLQ last was 2 weeks ago). Negative for blood in stool.       +dark stools x 1  month -BRB  Genitourinary:  Negative for hematuria.  Neurological:  Positive for dizziness and light-headedness (x 3-4 weeks). Negative for headaches.  All other systems reviewed and are negative.   Physical Exam Updated Vital Signs BP (!) 113/58   Pulse (!) 54   Temp 98 F (36.7 C) (Oral)   Resp (!) 24   SpO2 90%  Physical Exam Vitals and nursing note reviewed.  Constitutional:      General: He is not in acute distress.    Appearance: He is not diaphoretic.  HENT:     Head: Normocephalic and atraumatic.     Mouth/Throat:     Pharynx: No oropharyngeal exudate.  Eyes:     General: No scleral icterus.    Conjunctiva/sclera: Conjunctivae normal.  Cardiovascular:     Rate and Rhythm: Normal rate and regular rhythm.     Pulses: Normal pulses.     Heart sounds: Normal heart sounds.  Pulmonary:     Effort: Pulmonary effort is normal. No respiratory distress.     Breath sounds: Normal breath sounds. No wheezing.  Abdominal:     General: Bowel sounds are normal.     Palpations: Abdomen is soft. There is no mass.     Tenderness: There is no abdominal tenderness. There is no guarding or rebound.  Musculoskeletal:        General: Normal range of motion.     Cervical back: Normal range of motion and neck supple.  Skin:    General: Skin is warm and dry.  Neurological:     General: No focal deficit present.     Mental Status: He is alert.     Cranial Nerves: Cranial nerves 2-12 are intact.     Sensory: Sensation is intact.     Motor: Motor function is intact.  Psychiatric:        Behavior: Behavior normal.     ED Results / Procedures / Treatments   Labs (all labs ordered are listed, but only abnormal results are displayed) Labs Reviewed  COMPREHENSIVE METABOLIC PANEL - Abnormal; Notable for the following components:      Result Value   CO2 19 (*)    BUN 26 (*)    Creatinine, Ser 1.89 (*)    AST 12 (*)    Alkaline Phosphatase 36 (*)    GFR, Estimated 35 (*)    All  other components within normal limits  CBC WITH DIFFERENTIAL/PLATELET - Abnormal; Notable for the following components:   RBC 3.20 (*)    Hemoglobin 7.8 (*)    HCT 26.1 (*)    MCH 24.4 (*)    MCHC 29.9 (*)    RDW 19.0 (*)    Platelets 408 (*)    All other components within normal limits  PROTIME-INR  APTT  POC OCCULT BLOOD, ED  POC OCCULT BLOOD, ED  TYPE AND  SCREEN    EKG EKG Interpretation  Date/Time:  Wednesday July 22 2022 20:42:32 EDT Ventricular Rate:  52 PR Interval:  195 QRS Duration: 109 QT Interval:  482 QTC Calculation: 449 R Axis:   32 Text Interpretation: Sinus rhythm Artifact in lead(s) V5 V6 No significant change since last tracing prior 2/23 Confirmed by Aletta Edouard [phone]) on 07/22/2022 8:44:28 PM  Radiology DG Chest 2 View  Result Date: 07/22/2022 CLINICAL DATA:  Shortness of breath. EXAM: CHEST - 2 VIEW COMPARISON:  February 01, 2022 FINDINGS: The heart size and mediastinal contours are within normal limits. There is moderate severity calcification of the aortic arch. Both lungs are clear. Postoperative changes are seen within the visualized portion of the lower cervical spine. IMPRESSION: No active cardiopulmonary disease. Electronically Signed   By: Virgina Norfolk M.D.   On: 07/22/2022 20:32    Procedures Procedures    Medications Ordered in ED Medications - No data to display  ED Course/ Medical Decision Making/ A&P Clinical Course as of 07/22/22 2320  Wed Jul 22, 2022  2151 Re-evaluated and patient eating crackers at bedside.  Discussed with patient that the nurse and tech will come in to ambulate the patient and that I will discuss his lab and imaging findings after he has been ambulated. [SB]  2247 Patient reevaluated and sleeping on the stretcher.  Patient notes that he feels better after treatment regimen in the ED. Blood pressure in the ED at 107/57 while in the room with the patient.  [SB]  2311 Pt re-evaluated no FND. Blood pressure at  114/63. RN notified to obtain orthostatics. [SB]  2319 Notified by RN that patient wouldn't get up to ambulate [SB]    Clinical Course User Index [SB] Yareth Kearse A, PA-C                           Medical Decision Making Amount and/or Complexity of Data Reviewed Labs: ordered. Radiology: ordered.   Pt presents with concerns for anemia onset today. Pt has had lightheadedness, shortness of breath. Pt afebrile.  On exam patient without acute cardiovascular, respiratory, abdominal exam findings.  Normal focal logical   onset ***. {History of it?}. {Brief denies statement, denies sick contacts, similar symptoms}. Vital signs, ***. On exam, pt with no acute cardiovascular findings  . No acute cardiovascular, respiratory, abdominal exam findings. Differential diagnosis includes ***.    Co morbidities that complicate the patient evaluation: Iron deficiency anemia DM HTN  Additional history obtained:  External records from outside source obtained and reviewed including: Patient was evaluated by his primary care provider on 07/09/2022 for concerns for dizziness.  Patient was sent a prescription for meclizine as well as Flomax.  Blood pressure at that time was 100/55.  At that time labs were sent out and hemoglobin was at 7.  Patient was also instructed to check his blood pressure daily.  Patient was advised to stop taking his hydrochlorothiazide due to orthostatic blood pressure.  Patient has a follow-up appointment with his primary care provider in 1-2 weeks.  At that time and suggested that patient have home health PT/OT however it was noted that patient had declined that on previous visits.  According to patient provider note, will be suggested on next visit.  Also encourage patient use of assistive device due to dizzy spells.  Labs:  I ordered, and personally interpreted labs.  The pertinent results include:   ***  Imaging: I ordered  imaging studies including *** I independently  visualized and interpreted imaging which showed: *** I agree with the radiologist interpretation  Medications:  I ordered medication including *** for *** Reevaluation of the patient after these medicines and interventions, I reevaluated the patient and found that they have {resolved/improved/worsened:23923::"improved"} I have reviewed the patients home medicines and have made adjustments as needed   {Cardiac Monitoring: The patient was maintained on a cardiac monitor.  I personally viewed and interpreted the cardiac monitored which showed an underlying rhythm of: ***.   Test Considered: ***   Critical Interventions ***}   {Consultations: I requested consultation with the {sabspecialists:27145}, and discussed lab and imaging findings as well as pertinent plan - they recommend: ***}  Social Determinants of Health: ***   Disposition: {End of MDM here with the likely diagnosis}. After consideration of the diagnostic results and the patients response to treatment, I feel that the patient would benefit from {sabdispo:27146}. Supportive care measures and strict return precautions discussed with patient at bedside. Pt acknowledges and verbalizes understanding. Pt appears safe for discharge. Follow up as indicated in discharge paperwork.    This chart was dictated using voice recognition software, Dragon. Despite the best efforts of this provider to proofread and correct errors, errors may still occur which can change documentation meaning.   Final Clinical Impression(s) / ED Diagnoses Final diagnoses:  None    Rx / DC Orders ED Discharge Orders     None

## 2022-07-22 NOTE — ED Notes (Signed)
Patient asked to ambulate and stand up to perform orthostatic assessment. Patient states "I am not walking nor standing up, I didn't come up here to walk around nor to stand up so no I'm not doing that." So[place]ett PA-C notified.

## 2022-07-22 NOTE — ED Provider Notes (Signed)
[facility] EMERGENCY DEPARTMENT Provider Note   CSN: [id number] Arrival date & time: 07/22/22  1301     History  Chief Complaint  Patient presents with   Anemia    Russell [name] is a 81 y.o. male with a past medical history of iron deficiency anemia, diverticulosis, who presents to the emergency department with concerns for anemia.  Patient was called by his primary care provider to come into the ED for further evaluation due to low blood count on labs.  Patient has associated lightheadedness and shortness of breath.  No history of iron deficiency anemia.  No iron supplementation prescribed in the past.  Denies chest pain, shortness of breath, dizziness, palpitations, hemoptysis, hematochezia, hematemesis.  Patient last had a colonoscopy approximately 6-7 years.  No GI doctor at this time.   Per patient chart review: Patient was evaluated by his primary care provider on 07/09/2022 for concerns for dizziness.  Patient was sent a prescription for meclizine as well as Flomax.  Blood pressure at that time was 100/55.  At that time labs were sent out and hemoglobin was at 7.  Patient was also instructed to check his blood pressure daily.  Patient was advised to stop taking his hydrochlorothiazide due to orthostatic blood pressure.  Patient has a follow-up appointment with his primary care provider in 1-2 weeks.  At that time and suggested that patient have home health PT/OT however it was noted that patient had declined that on previous visits.  According to patient provider note, will be suggested on next visit.  Also encourage patient use of assistive device due to dizzy spells.     The history is provided by the patient. No language interpreter was used.       Home Medications Prior to Admission medications   Medication Sig Start Date End Date Taking? Authorizing Provider  acetaminophen (TYLENOL) 325 MG tablet Take 2 tablets (650 mg total) by mouth every 4 (four)  hours as needed for mild pain (or temp > 37.5 C (99.5 F)). 07/10/21   Samuella Cota, MD  ALPHAGAN P 0.1 % SOLN Place 1 drop into the right eye 3 (three) times daily. 03/23/22   [provider]  bimatoprost (LUMIGAN) 0.01 % SOLN Place 1 drop into both eyes at bedtime. 04/10/13   Dhungel, Nishant, MD  brimonidine (ALPHAGAN) 0.2 % ophthalmic solution Place 1 drop into both eyes 3 (three) times daily. 03/14/19   [provider]  Brinzolamide-Brimonidine (SIMBRINZA) 1-0.2 % SUSP Place 1 drop into both eyes 3 (three) times daily.    [provider]  clopidogrel (PLAVIX) 75 MG tablet TAKE 1 TABLET BY MOUTH EVERY DAY 06/01/22   Waynetta Sandy, MD  ferrous sulfate 325 (65 FE) MG tablet Take 1 tablet (325 mg total) by mouth every other day for 100 doses. 02/16/22 09/04/22  Precious Gilding, DO  gabapentin (NEURONTIN) 300 MG capsule Take 300 mg by mouth daily. 02/25/21   [provider]  losartan (COZAAR) 100 MG tablet Take 100 mg by mouth daily. 04/13/22   [provider]  Multiple Vitamin (MULTIVITAMIN WITH MINERALS) TABS tablet Take 1 tablet by mouth daily. One a day    [provider]  MYRBETRIQ 25 MG TB24 tablet Take 25 mg by mouth daily. 02/12/20   [provider]  oxyCODONE-acetaminophen (PERCOCET) 10-325 MG tablet Take 1 tablet by mouth every 4 (four) hours as needed for pain. 02/17/22   Hyatt, Max T, DPM  pantoprazole (PROTONIX)  40 MG tablet Take 1 tablet (40 mg total) by mouth daily. 02/04/22 03/06/22  Gerrit Heck, MD  pravastatin (PRAVACHOL) 80 MG tablet Take 80 mg by mouth daily.  10/19/16   [provider]      Allergies    Tramadol    Review of Systems   Review of Systems  Respiratory:  Positive for shortness of breath (x 1-2 weeks).   Cardiovascular:  Negative for chest pain.  Gastrointestinal:  Positive for abdominal pain (intermittent LLQ last was 2 weeks ago). Negative for blood in stool.       +dark stools x 1  month -BRB  Genitourinary:  Negative for hematuria.  Neurological:  Positive for dizziness and light-headedness (x 3-4 weeks). Negative for headaches.  All other systems reviewed and are negative.   Physical Exam Updated Vital Signs BP (!) 113/58   Pulse (!) 54   Temp 98 F (36.7 C) (Oral)   Resp (!) 24   SpO2 90%  Physical Exam Vitals and nursing note reviewed.  Constitutional:      General: He is not in acute distress.    Appearance: He is not diaphoretic.  HENT:     Head: Normocephalic and atraumatic.     Mouth/Throat:     Pharynx: No oropharyngeal exudate.  Eyes:     General: No scleral icterus.    Conjunctiva/sclera: Conjunctivae normal.  Cardiovascular:     Rate and Rhythm: Normal rate and regular rhythm.     Pulses: Normal pulses.     Heart sounds: Normal heart sounds.  Pulmonary:     Effort: Pulmonary effort is normal. No respiratory distress.     Breath sounds: Normal breath sounds. No wheezing.  Abdominal:     General: Bowel sounds are normal.     Palpations: Abdomen is soft. There is no mass.     Tenderness: There is no abdominal tenderness. There is no guarding or rebound.  Musculoskeletal:        General: Normal range of motion.     Cervical back: Normal range of motion and neck supple.  Skin:    General: Skin is warm and dry.  Neurological:     Mental Status: He is alert.  Psychiatric:        Behavior: Behavior normal.     ED Results / Procedures / Treatments   Labs (all labs ordered are listed, but only abnormal results are displayed) Labs Reviewed  COMPREHENSIVE METABOLIC PANEL - Abnormal; Notable for the following components:      Result Value   CO2 19 (*)    BUN 26 (*)    Creatinine, Ser 1.89 (*)    AST 12 (*)    Alkaline Phosphatase 36 (*)    GFR, Estimated 35 (*)    All other components within normal limits  CBC WITH DIFFERENTIAL/PLATELET - Abnormal; Notable for the following components:   RBC 3.20 (*)    Hemoglobin 7.8 (*)    HCT  26.1 (*)    MCH 24.4 (*)    MCHC 29.9 (*)    RDW 19.0 (*)    Platelets 408 (*)    All other components within normal limits  PROTIME-INR  APTT  POC OCCULT BLOOD, ED  POC OCCULT BLOOD, ED  TYPE AND SCREEN    EKG None  Radiology No results found.  Procedures Procedures    Medications Ordered in ED Medications - No data to display  ED Course/ Medical Decision Making/ A&P Clinical Course as  of 07/22/22 2234  Wed Jul 22, 2022  2151 Re-evaluated and patient eating crackers at bedside.  Discussed with patient that the nurse and tech will come in to ambulate the patient and that I will discuss his lab and imaging findings after he has been ambulated. [SB]    Clinical Course User Index [SB] Bently Wyss A, PA-C                           Medical Decision Making Amount and/or Complexity of Data Reviewed Labs: ordered. Radiology: ordered.   Pt presents with concerns for anemia onset today. Pt has had lightheadedness, shortness of breath, ***. Pt afebrile.    onset ***. {History of it?}. {Brief denies statement, denies sick contacts, similar symptoms}. Vital signs, ***. On exam, pt with ***. No acute cardiovascular, respiratory, abdominal exam findings. Differential diagnosis includes ***.    Co morbidities that complicate the patient evaluation: Iron deficiency anemia DM HTN  Additional history obtained:  External records from outside source obtained and reviewed including: Patient was evaluated by his primary care provider on 07/09/2022 for concerns for dizziness.  Patient was sent a prescription for meclizine as well as Flomax.  Blood pressure at that time was 100/55.  At that time labs were sent out and hemoglobin was at 7.  Patient was also instructed to check his blood pressure daily.  Patient was advised to stop taking his hydrochlorothiazide due to orthostatic blood pressure.  Patient has a follow-up appointment with his primary care provider in 1-2 weeks.  At that time  and suggested that patient have home health PT/OT however it was noted that patient had declined that on previous visits.  According to patient provider note, will be suggested on next visit.  Also encourage patient use of assistive device due to dizzy spells.  Labs:  I ordered, and personally interpreted labs.  The pertinent results include:   ***  Imaging: I ordered imaging studies including *** I independently visualized and interpreted imaging which showed: *** I agree with the radiologist interpretation  Medications:  I ordered medication including *** for *** Reevaluation of the patient after these medicines and interventions, I reevaluated the patient and found that they have {resolved/improved/worsened:23923::"improved"} I have reviewed the patients home medicines and have made adjustments as needed   {Cardiac Monitoring: The patient was maintained on a cardiac monitor.  I personally viewed and interpreted the cardiac monitored which showed an underlying rhythm of: ***.   Test Considered: ***   Critical Interventions ***}   {Consultations: I requested consultation with the {sabspecialists:27145}, and discussed lab and imaging findings as well as pertinent plan - they recommend: ***}  Social Determinants of Health: ***   Disposition: {End of MDM here with the likely diagnosis}. After consideration of the diagnostic results and the patients response to treatment, I feel that the patient would benefit from {sabdispo:27146}. Supportive care measures and strict return precautions discussed with patient at bedside. Pt acknowledges and verbalizes understanding. Pt appears safe for discharge. Follow up as indicated in discharge paperwork.    This chart was dictated using voice recognition software, Dragon. Despite the best efforts of this provider to proofread and correct errors, errors may still occur which can change documentation meaning.   Final Clinical Impression(s) /  ED Diagnoses Final diagnoses:  None    Rx / DC Orders ED Discharge Orders     None

## 2022-07-23 DIAGNOSIS — D649 Anemia, unspecified: Secondary | ICD-10-CM | POA: Diagnosis not present

## 2022-07-23 NOTE — ED Notes (Signed)
Patient verbalizes understanding of discharge instructions. Opportunity for questioning and answers were provided. Armband removed by staff, pt discharged from ED. Pt ambulated out to lobby  

## 2022-07-23 NOTE — Discharge Instructions (Addendum)
Your hemoglobin has improved from 1 week ago, please continue with your iron pills.   I like you to follow-up with your PCP for further evaluation of your anemia.  Come back to the emergency department if you develop chest pain, shortness of breath, severe abdominal pain, uncontrolled nausea, vomiting, diarrhea.

## 2022-07-23 NOTE — ED Notes (Signed)
Attempted to call pt's daughter for ride home, no answer. Pt was provided bus pass and directed to lobby.

## 2022-07-23 NOTE — ED Provider Notes (Signed)
Received at shift change from Soijett blue PA-C please see note for full detail  Patient medical history including anemia, diverticulosis presents due to concerns of anemia.  Patient had a low hemoglobin reading at his PCP is at 7, and was told to come in for reassessment, he notes that he is felt slightly weak and did not feel himself.  He not endorsing headaches change in vision paresthesias or weakness in the lower extremities denies any chest pain abdominal pain nausea or vomiting.  Per previous provider reassess the patient determine if further imaging is needed if not can be discharged home with iron and follow-up with his PCP. Physical Exam  BP (!) 111/59   Pulse 62   Temp 98.2 F (36.8 C) (Oral)   Resp 15   SpO2 100%   Physical Exam Vitals and nursing note reviewed.  Constitutional:      General: He is not in acute distress.    Appearance: He is not ill-appearing.  HENT:     Head: Normocephalic and atraumatic.     Comments: No deformity the head present no raccoon eyes or battle sign noted.    Nose: No congestion.     Mouth/Throat:     Mouth: Mucous membranes are moist.     Pharynx: Oropharynx is clear.     Comments: no trismus no torticollis no oral trauma Eyes:     Extraocular Movements: Extraocular movements intact.     Conjunctiva/sclera: Conjunctivae normal.     Pupils: Pupils are equal, round, and reactive to light.  Cardiovascular:     Rate and Rhythm: Normal rate and regular rhythm.     Pulses: Normal pulses.     Heart sounds: No murmur heard.    No friction rub. No gallop.  Pulmonary:     Effort: No respiratory distress.     Breath sounds: No wheezing, rhonchi or rales.  Skin:    General: Skin is warm and dry.  Neurological:     Mental Status: He is alert.     GCS: GCS eye subscore is 4. GCS verbal subscore is 5. GCS motor subscore is 6.     Cranial Nerves: Cranial nerves 2-12 are intact. No cranial nerve deficit.     Motor: No weakness.     Gait: Gait is  intact.     Comments: Cranial nerves II through XII grossly intact no difficulty with word finding following two-step commands no unilateral weakness present.  Psychiatric:        Mood and Affect: Mood normal.     Procedures  Procedures  ED Course / MDM   Clinical Course as of 07/23/22 0134  Wed Jul 22, 2022  2151 Re-evaluated and patient eating crackers at bedside.  Discussed with patient that the nurse and tech will come in to ambulate the patient and that I will discuss his lab and imaging findings after he has been ambulated. [SB]  2247 Patient reevaluated and sleeping on the stretcher.  Patient notes that he feels better after treatment regimen in the ED. Blood pressure in the ED at 107/57 while in the room with the patient.  [SB]  2311 Pt re-evaluated no FND. Blood pressure at 114/63. RN notified to obtain orthostatics. [SB]  2319 Notified by RN that patient wouldn't get up to ambulate [SB]    Clinical Course User Index [SB] Blue, Soijett A, PA-C   Medical Decision Making Amount and/or Complexity of Data Reviewed Labs: ordered. Radiology: ordered.    Co morbidities  that complicate the patient evaluation  Hypertension, COPD peripheral vascular disease  Social Determinants of Health:  Geriatric    Lab Tests:  I Ordered, and personally interpreted labs.  The pertinent results include: CBC shows normocytic anemia hemoglobin 7.8 at baseline for patient.  CMP shows CO2 of 19 BUN 26 creatinine 1.89 slightly above baseline prothrombin time INR unremarkable Hemoccult negative   Imaging Studies ordered:  I ordered imaging studies including chest x-ray I independently visualized and interpreted imaging which showed negative I agree with the radiologist interpretation   Cardiac Monitoring:  The patient was maintained on a cardiac monitor.  I personally viewed and interpreted the cardiac monitored which showed an underlying rhythm of: Without signs of  ischemia   Medicines ordered and prescription drug management:  I ordered medication including fluids I have reviewed the patients home medicines and have made adjustments as needed  Critical Interventions:  N/A   Reevaluation:  Patient was reassessed, resting comfortably, there is no focal deficit on my exam, patient is able to ambulate without difficulty.  He is agreement with plan and discharge at this time.   Consultations Obtained:  N/A   Test Considered:  CT head-defer suspicion for intracranial head bleed very low at this time, on anticoag's no head trauma no deficits noted.    Rule out  Low suspicion for CVA he has no focal deficit present my exam.  Low suspicion for dissection of the vertebral or carotid artery as presentation atypical of etiology.  Low suspicion for meningitis as she has no meningeal sign present.  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   Dispostion and problem list  After consideration of the diagnostic results and the patients response to treatment, I feel that the patent would benefit from discharge.  Anemia- currently at his baseline, patient is currently on iron at this time, we will have him continue with this, may follow-up with his PCP for further assessment.  Strict return precautions.           Keyvin, Rison, PA-C 07/23/22 0134    Ripley Fraise, MD 07/23/22 [phone]

## 2022-07-28 ENCOUNTER — Ambulatory Visit (INDEPENDENT_AMBULATORY_CARE_PROVIDER_SITE_OTHER): Payer: Medicare Other | Admitting: Podiatry

## 2022-07-28 ENCOUNTER — Encounter: Payer: Self-pay | Admitting: Podiatry

## 2022-07-28 DIAGNOSIS — G5782 Other specified mononeuropathies of left lower limb: Secondary | ICD-10-CM

## 2022-07-28 MED ORDER — OXYCODONE-ACETAMINOPHEN 10-325 MG PO TABS: 1.0000 | ORAL_TABLET | ORAL | 0 refills | Status: DC | PRN

## 2022-07-28 NOTE — Progress Notes (Signed)
He presents today states that his foot is doing a little better this week than it was last time.  He states that he felt better almost immediately last time he was in.  Objective: Vital signs stable he is alert oriented x3 there is no erythema edema salines drainage or odor.  Appears to be doing well today there is still painful on palpation to the third interdigital space and third intermetatarsal space.  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  Plan: Injected the area today with 5 mg Kenalog tolerated procedure well.  Follow-up with him in a month prescribed pain medication enough to last for 7 days if taken as directed

## 2022-07-30 ENCOUNTER — Telehealth: Payer: Self-pay

## 2022-07-30 NOTE — Telephone Encounter (Signed)
Russell Evens, FNP called with [facility] stating that the pt was on Plavis 75 mg daily, but was just seen in the office and sent to ED for Hgb of 7. She was unsure of reason for medication and was requesting confirmation to stop it until the source of bleeding was resolved.  Reviewed pt's chart, spoke with Russell [name], who directed me to speak with Russell [name].  Russell Rose RN with [facility] called stating his Plavix had been stopped and needed the information regarding his reason for being prescribed. Informed her that the pt had a DES to his L SFA on 02/26/21. Gave her secure cell # to call after hearing from Russell [name].  Spoke with Russell [name], who confirmed stopping Plavix. He stated that it had been close to 17 months since his surgery and he may not need to restart it, but he can after bleeding resolved. A f/u visit was never scheduled, so the office will be working on calling pt to schedule. Plavix will be re-addressed at that time.   Called [facility], left vm on triage line. Called Russell [employer] on cell as requested, no answer, lf vm.

## 2022-08-18 ENCOUNTER — Ambulatory Visit (INDEPENDENT_AMBULATORY_CARE_PROVIDER_SITE_OTHER): Payer: Medicare Other | Admitting: Podiatry

## 2022-08-18 ENCOUNTER — Encounter: Payer: Self-pay | Admitting: Podiatry

## 2022-08-18 DIAGNOSIS — M79676 Pain in unspecified toe(s): Secondary | ICD-10-CM

## 2022-08-18 DIAGNOSIS — B351 Tinea unguium: Secondary | ICD-10-CM | POA: Diagnosis not present

## 2022-08-18 DIAGNOSIS — G5782 Other specified mononeuropathies of left lower limb: Secondary | ICD-10-CM

## 2022-08-18 MED ORDER — OXYCODONE-ACETAMINOPHEN 10-325 MG PO TABS: 1.0000 | ORAL_TABLET | Freq: Three times a day (TID) | ORAL | 0 refills | Status: AC | PRN

## 2022-08-18 NOTE — Progress Notes (Signed)
He presents today chief complaint of a painful neuroma third interdigital space of his left foot.  States that it seems to been doing better with the medicine that we have been using lately.  He also goes on to say that he is continuing to lose weight and they are going to do surgery on his left arm as he refers to orthopedics.  Objective: Vital signs stable alert oriented x3 pulses are nonpalpable bilateral neurologic sensorium is diminished he does have pain on palpation to the third interdigital space of the left foot.  Assessment stump neuroma.  Plan: Discussed etiology pathology and surgical therapies injected 5 mg of Kenalog and local anesthetic into the area today dispensed another 5 days oxycodone.

## 2022-09-04 ENCOUNTER — Other Ambulatory Visit: Payer: Self-pay | Admitting: Orthopedic Surgery

## 2022-09-08 ENCOUNTER — Encounter: Payer: Self-pay | Admitting: Podiatry

## 2022-09-08 ENCOUNTER — Ambulatory Visit (INDEPENDENT_AMBULATORY_CARE_PROVIDER_SITE_OTHER): Payer: Medicare Other | Admitting: Podiatry

## 2022-09-08 ENCOUNTER — Other Ambulatory Visit: Payer: Self-pay | Admitting: Podiatry

## 2022-09-08 DIAGNOSIS — G5782 Other specified mononeuropathies of left lower limb: Secondary | ICD-10-CM | POA: Diagnosis not present

## 2022-09-08 MED ORDER — OXYCODONE-ACETAMINOPHEN 10-325 MG PO TABS: 1.0000 | ORAL_TABLET | Freq: Three times a day (TID) | ORAL | 0 refills | Status: AC | PRN

## 2022-09-08 NOTE — Progress Notes (Signed)
He presents today for follow-up of his neuroma third interspace left foot.  He states that seems to be doing better since the last injection.  Since we seem to have placed it a little more up here as he points more proximally.  Objective: Pulses are remaining nonpalpable he has pain on palpation to the mid diaphyseal region of the third interdigital space left.  Assessment: Stump neuroma third interspace left.  Plan: Reinjected another dose of dehydrated alcohol today.  Refilled his pain medication and we will follow-up with him in 1 month

## 2022-09-10 ENCOUNTER — Other Ambulatory Visit: Payer: Self-pay | Admitting: Family

## 2022-09-16 ENCOUNTER — Other Ambulatory Visit: Payer: Self-pay | Admitting: Family

## 2022-09-21 ENCOUNTER — Ambulatory Visit (HOSPITAL_BASED_OUTPATIENT_CLINIC_OR_DEPARTMENT_OTHER): Admit: 2022-09-21 | Payer: Medicare Other | Admitting: Orthopedic Surgery

## 2022-09-21 ENCOUNTER — Encounter (HOSPITAL_BASED_OUTPATIENT_CLINIC_OR_DEPARTMENT_OTHER): Payer: Self-pay

## 2022-09-21 SURGERY — CARPAL TUNNEL RELEASE
Anesthesia: Regional | Laterality: Left

## 2022-09-28 ENCOUNTER — Other Ambulatory Visit: Payer: Self-pay | Admitting: *Deleted

## 2022-09-28 DIAGNOSIS — I739 Peripheral vascular disease, unspecified: Secondary | ICD-10-CM

## 2022-09-28 DIAGNOSIS — I70222 Atherosclerosis of native arteries of extremities with rest pain, left leg: Secondary | ICD-10-CM

## 2022-09-29 ENCOUNTER — Ambulatory Visit (INDEPENDENT_AMBULATORY_CARE_PROVIDER_SITE_OTHER): Payer: Medicare Other | Admitting: Podiatry

## 2022-09-29 ENCOUNTER — Encounter: Payer: Self-pay | Admitting: Podiatry

## 2022-09-29 ENCOUNTER — Other Ambulatory Visit: Payer: Self-pay | Admitting: Podiatry

## 2022-09-29 DIAGNOSIS — G5782 Other specified mononeuropathies of left lower limb: Secondary | ICD-10-CM | POA: Diagnosis not present

## 2022-09-29 MED ORDER — OXYCODONE-ACETAMINOPHEN 10-325 MG PO TABS: 1.0000 | ORAL_TABLET | Freq: Three times a day (TID) | ORAL | 0 refills | Status: AC | PRN

## 2022-09-29 NOTE — Progress Notes (Signed)
BeforeHe presents today for follow-up of his neuroma third interdigital space.  States that he gets about halfway through the month hurt again.  That is when his pain medicine comes in and he.  Objective: Vital signs are stable oriented x3 pulses barely palpable.  No change to the foot cutis or anatomical structure.  Status pain on palpation to the third interspace left.  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  Plan: Injected dehydrated alcohol into the lesion today and I prescribed him another 20 tablets of oxycodone.

## 2022-10-06 ENCOUNTER — Ambulatory Visit (INDEPENDENT_AMBULATORY_CARE_PROVIDER_SITE_OTHER): Payer: Medicare Other | Admitting: Physician Assistant

## 2022-10-06 ENCOUNTER — Ambulatory Visit (HOSPITAL_COMMUNITY)
Admission: RE | Admit: 2022-10-06 | Discharge: 2022-10-06 | Disposition: A | Discharge status: Home / Self Care | Payer: Medicare Other | Source: Ambulatory Visit | Attending: Vascular Surgery | Admitting: Vascular Surgery

## 2022-10-06 ENCOUNTER — Ambulatory Visit (INDEPENDENT_AMBULATORY_CARE_PROVIDER_SITE_OTHER)
Admission: RE | Admit: 2022-10-06 | Discharge: 2022-10-06 | Disposition: A | Discharge status: Home / Self Care | Payer: Medicare Other | Source: Ambulatory Visit | Attending: Vascular Surgery | Admitting: Vascular Surgery

## 2022-10-06 VITALS — Temp 98.0°F | Resp 20 | Ht 74.0 in | Wt 140.5 lb

## 2022-10-06 DIAGNOSIS — I739 Peripheral vascular disease, unspecified: Secondary | ICD-10-CM | POA: Diagnosis not present

## 2022-10-06 DIAGNOSIS — I70222 Atherosclerosis of native arteries of extremities with rest pain, left leg: Secondary | ICD-10-CM | POA: Insufficient documentation

## 2022-10-06 NOTE — Progress Notes (Signed)
Office Note     CC:  follow up Requesting Provider:  Marcie Mowers, FNP  HPI: Russell [name] is a 81 y.o. (August 12, 1941) male who presents for routine follow up of PAD. He has history of SFA drug eluting stent that was placed in March of 2022 for rest pain. He has not had any recurrent symptoms. He denies any pain on ambulation or rest. No tissue loss. He has had long history of numbness in his left leg and foot. He has a known neuroma that he receives injections for by his Podiatrist, which helps for a while. He also takes Gabapentin and Percocet for pain. He says he does not understand why he is here today but that he needs surgery on his left arm so they told him to come here. He is unsure of all his medications. He does currently still smoke   The pt is on a statin for cholesterol management.  The pt is on a daily aspirin.   Other AC:  none The pt is on ARB for hypertension.   The pt is not diabetic.  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   COPD (chronic obstructive pulmonary disease) ([place])    Coronary atherosclerosis    denied knowledge of this 10/27/16   Depression    Diverticulosis of colon    DYSPHAGIA UNSPECIFIED 05/16/2009   Qualifier: Diagnosis of  By: Amil Amen MD, [place]     Dyspnea    EARLY SATIETY 11/04/2007   Qualifier: Diagnosis of  By: Amil Amen MD, Benjamine Mola     ED (erectile dysfunction)    Full dentures    GERD  (gastroesophageal reflux disease)    not current   Glaucoma    History of gastritis    History of scabies    2008   History of seizure    2013-- x2   idiopathic --  none since   History of syncope    03-22-2014  DX  VAGAL RESPONSE   Hyperlipidemia, mixed    Hypertension    Mitral regurgitation    Peripheral arterial disease ([place])    one vessel runoff bilaterally via peroneal arteries   Prostate cancer ([place])    Seizures ([place]) 03/2014   ? or syncope- patient refused to be admitted for further studies- "felt better"   Type 2 diabetes, diet controlled ([place])    patient and his wife said no- have not been told 01/21/17   Wears glasses     Past Surgical History:  Procedure Laterality Date   ABDOMINAL AORTOGRAM W/LOWER EXTREMITY Left 12/18/2020   Procedure: ABDOMINAL AORTOGRAM W/LOWER EXTREMITY;  Surgeon: Cherre Robins, MD;  Location: [facility];  Service: Cardiovascular;  Laterality: Left;   ABDOMINAL AORTOGRAM W/LOWER EXTREMITY N/A 02/26/2021   Procedure: ABDOMINAL AORTOGRAM W/LOWER EXTREMITY;  Surgeon: Cherre Robins, MD;  Location: [facility];  Service: Cardiovascular;  Laterality: N/A;   ANTERIOR CERVICAL DECOMP/DISCECTOMY FUSION  10-16-2009   C4 -- C6   ANTERIOR CERVICAL DECOMP/DISCECTOMY FUSION N/A 01/27/2017   Procedure: ANTERIOR CERVICAL DECOMPRESSION FUSION CERVICAL 3-4 WITH INSTRUMENTATION AND ALLOGRAFT;  Surgeon: Phylliss Bob, MD;  Location: [facility];  Service: Orthopedics;  Laterality: N/A;  ANTERIOR CERVICAL DECOMPRESSION FUSION CERVICAL 3-4 WITH INSTRUMENTATION AND ALLOGRAFT   CAPSULOTOMY Right 01/26/2013   Procedure: MINOR CAPSULOTOMY;  Surgeon: Myrtha Mantis., MD;  Location: [place] [facility];  Service: Ophthalmology;  Laterality: Right;   CARPAL TUNNEL RELEASE Left 01/02/2019   Procedure: LEFT CARPAL TUNNEL RELEASE;  Surgeon: Leanora Cover, MD;  Location: [place];  Service: Orthopedics;  Laterality: Left;   CARPECTOMY Right 03/13/2014    Procedure: RIGHT  PROXIMAL ROW CARPECTOMY;  Surgeon: Tennis Must, MD;  Location: [place];  Service: Orthopedics;  Laterality: Right;   CARPECTOMY Left 10/22/2015   Procedure: LEFT WRIST PROXIMAL ROW CARPECTOMY ;  Surgeon: Leanora Cover, MD;  Location: [place];  Service: Orthopedics;  Laterality: Left;   CARPOMETACARPAL ([place]) FUSION OF THUMB Right 03/13/2014   Procedure: RIGHT FUSION OF THUMB CARPOMETACARPAL [facility]) JOINT;  Surgeon: Tennis Must, MD;  Location: [place];  Service: Orthopedics;  Laterality: Right;   CARPOMETACARPEL SUSPENSION PLASTY Left 04/04/2020   Procedure: LEFT THUMB TRAPECIECTOMY AND SUSPENSIONPLASTY;  Surgeon: Leanora Cover, MD;  Location: [place];  Service: Orthopedics;  Laterality: Left;   CATARACT EXTRACTION W/ INTRAOCULAR LENS  IMPLANT, BILATERAL  2009   COLONOSCOPY  08-31-2007   COLONOSCOPY     CRYOABLATION N/A 01/14/2015   Procedure: CRYO ABLATION PROSTATE;  Surgeon: Ailene Rud, MD;  Location: [facility];  Service: Urology;  Laterality: N/A;   ESOPHAGOGASTRODUODENOSCOPY  last one 09-11-2008   EXCISION MASS LEFT FACE , SUBORBITAL AREA, PLASTER RECONSTRUCTION  02-09-2011   EXCISIONAL DEBRIDEMENT AND REPAIR RIGHT QUADRICEP TENDON  07-05-2000   FOOT SURGERY Left    I & D EXTREMITY Right 05/24/2013   Procedure: RIGHT INDEX WOUND DEBRIDEMENT AND CLOSURE;  Surgeon: Jolyn Nap, MD;  Location: [place];  Service: Orthopedics;  Laterality: Right;   INSERTION OF SUPRAPUBIC CATHETER N/A 01/14/2015   Procedure: SUPRAPUBIC TUBE PLACEMENT;  Surgeon: Ailene Rud, MD;  Location: [facility];  Service: Urology;  Laterality: N/A;   KNEE ARTHROSCOPY Right 2008   LARYNGOSCOPY AND ESOPHAGOSCOPY  06-13-2010   MARSUPIALIZATION LEFT LARGE VALLECULAR CYST   MASS EXCISION Left 10/22/2015   Procedure: EXCISION MASS OF LEFT INDEX FINGER;  Surgeon: Leanora Cover,  MD;  Location: [place];  Service: Orthopedics;  Laterality: Left;   ORCHIECTOMY Left 02/24/2015   Procedure: SCROTAL EXPLORATION WITH LEFT ORCHIECTOMY;  Surgeon: Kathie Rhodes, MD;  Location: [place];  Service: Urology;  Laterality: Left;   PERIPHERAL VASCULAR INTERVENTION Left 02/26/2021   Procedure: PERIPHERAL VASCULAR INTERVENTION;  Surgeon: Cherre Robins, MD;  Location: [place] [facility];  Service: Cardiovascular;  Laterality: Left;  Superficial femoral   POSTERIOR CERVICAL FUSION/FORAMINOTOMY N/A 01/28/2017   Procedure: POSTERIOR SPINAL FUSION CERVICAL 3-4, CERVICAL 4-5, CERVICAL 5-6, CERVICAL 6-7 WITH INSTRUMENATION AND ALLOGRAFT;  Surgeon: Phylliss Bob, MD;  Location: [place];  Service: Orthopedics;  Laterality: N/A;  POSTERIOR SPINAL FUSION CERVICAL 3-4, CERVICAL 4-5, CERVICAL 5-6, CERVICAL 6-7 WITH INSTRUMENATION AND ALLOGRAFT   SHOULDER ARTHROSCOPY/ DEBRIDEMENT LABRAL TEAR/  BICEPS TENOTOMY Left 09-24-2011   TONSILLECTOMY  as child   TRANSTHORACIC ECHOCARDIOGRAM  02-21-2010   normal LVF/  ef 55-60%/  mild to moderate MR/  moderate LAE/  mild TR   YAG LASER APPLICATION Right 01/24/5572   Procedure: YAG LASER APPLICATION;  Surgeon: Myrtha Mantis., MD;  Location: [place];  Service: Ophthalmology;  Laterality: Right;   YAG LASER CAPSULOTOMY, LEFT EYE  01-08-2011    Social History   Socioeconomic History   Marital status: Married    Spouse name: Not on file   Number of children: 3   Years of education: 12   Highest education level: High school graduate  Occupational History   Occupation: Retired - poured concrete  Tobacco Use   Smoking status: Every Day    Packs/day: 1.00    Years: 59.00    Total pack years: 59.00    Types: Cigarettes   Smokeless tobacco: Never   Tobacco comments:    Started Chantix 10/26/16  Vaping Use   Vaping Use: Never used  Substance and Sexual Activity   Alcohol use: No    Comment: none since approx 2014- heavy before   Drug use: No    Sexual activity: Not on file  Other Topics Concern   Not on file  Social History Narrative   Lives with wife and 2 of his children in a one story home.  Has 3 children.  Retired Counsellor.  Education: high school.   Right Handed   Social Determinants of Health   Financial Resource Strain: Not on file  Food Insecurity: Not on file  Transportation Needs: Not on file  Physical Activity: Not on file  Stress: Not on file  Social Connections: Not on file  Intimate Partner Violence: Not on file    Family History  Problem Relation Age of Onset   Unexplained death Mother        died at a young age, no known cause   Heart attack Father        age unknown   Diabetes Other    Cancer Other     Current Outpatient Medications  Medication Sig Dispense Refill   acetaminophen (TYLENOL) 325 MG tablet Take 2 tablets (650 mg total) by mouth every 4 (four) hours as needed for mild pain (or temp > 37.5 C (99.5 F)).     ALPHAGAN P 0.1 % SOLN Place 1 drop into the right eye 3 (three) times daily.     bimatoprost (LUMIGAN) 0.01 % SOLN Place 1 drop into both eyes at bedtime. 30 mL 3   brimonidine (ALPHAGAN) 0.2 % ophthalmic solution Place 1 drop into both eyes 3 (three) times daily.     Brinzolamide-Brimonidine (SIMBRINZA) 1-0.2 % SUSP Place 1 drop into both eyes 3 (three) times daily.     clopidogrel (PLAVIX) 75 MG tablet TAKE 1 TABLET BY MOUTH EVERY DAY 30 tablet 3   gabapentin (NEURONTIN) 300 MG capsule Take 300 mg by mouth daily.     losartan (COZAAR) 100 MG tablet Take 100 mg by mouth daily.     Multiple Vitamin (MULTIVITAMIN WITH MINERALS) TABS tablet Take 1 tablet by mouth daily. One a day     MYRBETRIQ 25 MG TB24 tablet Take 25 mg by mouth daily.     oxyCODONE-acetaminophen (PERCOCET) 10-325 MG tablet Take 1 tablet by mouth every 4 (four) hours as needed for pain. 21 tablet 0   oxyCODONE-acetaminophen (PERCOCET) 10-325 MG tablet Take 1 tablet by mouth every 8 (eight) hours as needed  for up to 7 days for pain. 21 tablet 0   pravastatin (PRAVACHOL) 80 MG tablet  Take 80 mg by mouth daily.   2   ferrous sulfate 325 (65 FE) MG tablet Take 1 tablet (325 mg total) by mouth every other day for 100 doses. 100 tablet 0   pantoprazole (PROTONIX) 40 MG tablet Take 1 tablet (40 mg total) by mouth daily. 30 tablet 0   No current facility-administered medications for this visit.    Allergies  Allergen Reactions   Tramadol     Confusion (intolerance)     REVIEW OF SYSTEMS:  '[X]'$  denotes positive finding, '[ ]'$  denotes negative finding Cardiac  Comments:  Chest pain or chest pressure:    Shortness of breath upon exertion:    Short of breath when lying flat:    Irregular heart rhythm:        Vascular    Pain in calf, thigh, or hip brought on by ambulation:    Pain in feet at night that wakes you up from your sleep:     Blood clot in your veins:    Leg swelling:         Pulmonary    Oxygen at home:    Productive cough:     Wheezing:         Neurologic    Sudden weakness in arms or legs:     Sudden numbness in arms or legs:     Sudden onset of difficulty speaking or slurred speech:    Temporary loss of vision in one eye:     Problems with dizziness:         Gastrointestinal    Blood in stool:     Vomited blood:         Genitourinary    Burning when urinating:     Blood in urine:        Psychiatric    Major depression:         Hematologic    Bleeding problems:    Problems with blood clotting too easily:        Skin    Rashes or ulcers:        Constitutional    Fever or chills:      PHYSICAL EXAMINATION:  Vitals:   10/06/22 1230  Resp: 20  Temp: 98 F (36.7 C)  TempSrc: Temporal  SpO2: 95%  Weight: 140 lb 8 oz (63.7 kg)  Height: '6\' 2"'$  (1.88 m)    General:  WDWN in NAD; vital signs documented above Gait: Normal HENT: WNL, normocephalic Pulmonary: normal non-labored breathing Cardiac: regular HR, without  Murmurs  Abdomen: flat,  soft Vascular Exam/Pulses:  Right Left  Radial 2+ (normal) 2+ (normal)  Femoral 2+ (normal) 2+ (normal)  Popliteal 2+ (normal) 2+ (normal)  DP absent absent  PT absent absent   Extremities: without ischemic changes, without Gangrene , without cellulitis; without open wounds;  Musculoskeletal: no muscle wasting or atrophy  Neurologic: A&O X 3;  No focal weakness or paresthesias are detected Psychiatric:  The pt has Normal affect.   Non-Invasive Vascular Imaging:   +-------+-----------+-----------+------------+------------+  ABI/TBIToday's ABIToday's TBIPrevious ABIPrevious TBI  +-------+-----------+-----------+------------+------------+  Right  0.91       0.75       0.91        0.87          +-------+-----------+-----------+------------+------------+  Left   0.94       0.30       0.89        0.34          +-------+-----------+-----------+------------+------------+  Left Stent(s):  +---------------+--------+--------------+---------+--------+  Left SFA       PSV cm/sStenosis      Waveform Comments  +---------------+--------+--------------+---------+--------+  Prox to Stent  101     1-49% stenosistriphasic          +---------------+--------+--------------+---------+--------+  Proximal Stent 81      1-49% stenosistriphasic          +---------------+--------+--------------+---------+--------+  Mid Stent      79      1-49% stenosistriphasic          +---------------+--------+--------------+---------+--------+  Distal Stent   51      1-49% stenosisbiphasic           +---------------+--------+--------------+---------+--------+  Distal to Stent49      1-49% stenosisbiphasic           +---------------+--------+--------------+---------+--------+   Summary:  Left: Patent LT SFA stent without hemodynamically significant stenosis.   ASSESSMENT/PLAN:: 81 y.o. male here for follow up for PAD. He currently is without rest pain,  claudication or tissue loss. He remained on Plavix and Aspirin for >1 year following SFA drug eluting stenting in March of 2022. It is okay for patient to remain off of Plavix. Recommend continued Aspirin and Statin. He is okay from vascular standpoint to have surgery on his left arm as needed. Okay to hold Aspirin for the surgery but advised patient to resume after surgery - Encourage him to walk as much as he can and again discussed importance of keeping his feet protected - He knows to follow up sooner if he has any new concerns or symptoms - He will follow up in 1 year with ABI   Karoline Caldwell, PA-C [facility] [phone]  Clinic MD:   Carlis Abbott

## 2022-10-27 ENCOUNTER — Other Ambulatory Visit: Payer: Self-pay | Admitting: Podiatry

## 2022-10-27 ENCOUNTER — Ambulatory Visit (INDEPENDENT_AMBULATORY_CARE_PROVIDER_SITE_OTHER): Payer: Medicare Other | Admitting: Podiatry

## 2022-10-27 ENCOUNTER — Encounter: Payer: Self-pay | Admitting: Podiatry

## 2022-10-27 DIAGNOSIS — G5782 Other specified mononeuropathies of left lower limb: Secondary | ICD-10-CM | POA: Diagnosis not present

## 2022-10-27 MED ORDER — OXYCODONE-ACETAMINOPHEN 10-325 MG PO TABS: 1.0000 | ORAL_TABLET | Freq: Three times a day (TID) | ORAL | 0 refills | Status: AC | PRN

## 2022-10-27 NOTE — Progress Notes (Signed)
He presents today for follow-up of neuroma states that he started noticing some pain of his foot and to the level of his ankle.  States that otherwise he feels about the same.  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  Objective: Vital signs stable alert oriented x3 pulses are palpable barely to the left lower extremity there is no hair growth.  No open lesions or wounds.  He also demonstrates some pain along the palpation of the intermediate dorsal cutaneous nerve.  He also has pain on palpation to the third interdigital space.  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  Plan: Today I injected dehydrated alcohol to the intermediate dorsal cutaneous nerve at the level of the ankle just to see if this would alleviate any of his symptoms across the top of his foot or even into those toes.  We will follow-up with him in his regular schedule appointment in about 3 weeks I did send over another prescription of 7 days worth of Percocet.

## 2022-11-17 ENCOUNTER — Telehealth: Payer: Self-pay | Admitting: Podiatry

## 2022-11-17 ENCOUNTER — Ambulatory Visit: Payer: Medicare Other | Admitting: Podiatry

## 2022-11-17 ENCOUNTER — Other Ambulatory Visit: Payer: Self-pay | Admitting: Podiatry

## 2022-11-17 MED ORDER — OXYCODONE-ACETAMINOPHEN 10-325 MG PO TABS: 1.0000 | ORAL_TABLET | Freq: Three times a day (TID) | ORAL | 0 refills | Status: AC | PRN

## 2022-11-17 NOTE — Telephone Encounter (Signed)
Called pt to let him know medication was sent in and tried cell and work number and no answer

## 2022-11-17 NOTE — Telephone Encounter (Signed)
Pt canceled appt for today due to car trouble and has rescheduled to 12.19.2023 and he did ask if you could please call his medication into the pharmacy for him. He is still using the [facility] on [address].

## 2022-12-08 ENCOUNTER — Other Ambulatory Visit: Payer: Self-pay | Admitting: Podiatry

## 2022-12-08 ENCOUNTER — Ambulatory Visit (INDEPENDENT_AMBULATORY_CARE_PROVIDER_SITE_OTHER): Payer: Medicare Other | Admitting: Podiatry

## 2022-12-08 ENCOUNTER — Encounter: Payer: Self-pay | Admitting: Podiatry

## 2022-12-08 DIAGNOSIS — G5782 Other specified mononeuropathies of left lower limb: Secondary | ICD-10-CM | POA: Diagnosis not present

## 2022-12-08 MED ORDER — OXYCODONE-ACETAMINOPHEN 10-325 MG PO TABS: 1.0000 | ORAL_TABLET | Freq: Three times a day (TID) | ORAL | 0 refills | Status: AC | PRN

## 2022-12-08 NOTE — Progress Notes (Signed)
He presents today for follow-up of neuroma third interdigital space left foot.  States that it hurts a lot because I missed my last appointment.  States that he is about to have hand surgery done left.  Objective: Vital signs stable alert and oriented x 3 there is no erythema edema cellulitis drainage noted that he does have significant pain on palpation to the third interdigital space stating that the proximal injection did not help at all last time.  Assessment: Stump neuroma third interspace left foot severe peripheral vascular disease left foot.  Plan: Dehydrated alcohol was once again injected a total of 2 cc of 4% dehydrated alcohol with local anesthetic was injected into the distal third interdigital space.  I also sent over a prescription for Percocet 4-week supply.  Follow-up with him in 1 month

## 2022-12-31 ENCOUNTER — Other Ambulatory Visit: Payer: Self-pay | Admitting: Podiatry

## 2022-12-31 ENCOUNTER — Ambulatory Visit: Payer: Medicare Other | Admitting: Podiatry

## 2022-12-31 MED ORDER — OXYCODONE-ACETAMINOPHEN 10-325 MG PO TABS: 1.0000 | ORAL_TABLET | Freq: Three times a day (TID) | ORAL | 0 refills | Status: AC | PRN

## 2023-01-01 ENCOUNTER — Other Ambulatory Visit: Payer: Self-pay | Admitting: Vascular Surgery

## 2023-01-06 NOTE — Telephone Encounter (Signed)
Per office visit note on 10/06/22, pt ok to discontinue Plavix

## 2023-01-14 ENCOUNTER — Telehealth: Payer: Self-pay | Admitting: Podiatry

## 2023-01-14 ENCOUNTER — Other Ambulatory Visit: Payer: Self-pay | Admitting: Podiatry

## 2023-01-14 ENCOUNTER — Ambulatory Visit: Payer: 59 | Admitting: Podiatry

## 2023-01-14 MED ORDER — OXYCODONE-ACETAMINOPHEN 10-325 MG PO TABS: 1.0000 | ORAL_TABLET | Freq: Three times a day (TID) | ORAL | 0 refills | Status: AC | PRN

## 2023-01-14 NOTE — Telephone Encounter (Signed)
Pt requested a refill on his Rx for pain. Please advise

## 2023-01-14 NOTE — Telephone Encounter (Signed)
Pt called checking if medication was sent in and I looked and it did not show it has. I did tell pt the message was sent to the provider. Pt asked if someone would please call pt when it has been sent in.

## 2023-01-22 ENCOUNTER — Emergency Department (HOSPITAL_COMMUNITY)
Admission: EM | Admit: 2023-01-22 | Discharge: 2023-01-22 | Disposition: A | Discharge status: Home / Self Care | Payer: 59 | Attending: Emergency Medicine | Admitting: Emergency Medicine

## 2023-01-22 ENCOUNTER — Emergency Department (HOSPITAL_COMMUNITY): Payer: 59

## 2023-01-22 ENCOUNTER — Encounter (HOSPITAL_COMMUNITY): Payer: Self-pay

## 2023-01-22 ENCOUNTER — Other Ambulatory Visit: Payer: Self-pay

## 2023-01-22 DIAGNOSIS — Z1152 Encounter for screening for COVID-19: Secondary | ICD-10-CM | POA: Insufficient documentation

## 2023-01-22 DIAGNOSIS — D509 Iron deficiency anemia, unspecified: Secondary | ICD-10-CM

## 2023-01-22 DIAGNOSIS — R195 Other fecal abnormalities: Secondary | ICD-10-CM | POA: Diagnosis not present

## 2023-01-22 DIAGNOSIS — R5383 Other fatigue: Secondary | ICD-10-CM | POA: Insufficient documentation

## 2023-01-22 DIAGNOSIS — R531 Weakness: Secondary | ICD-10-CM

## 2023-01-22 DIAGNOSIS — N1832 Chronic kidney disease, stage 3b: Secondary | ICD-10-CM

## 2023-01-22 DIAGNOSIS — D649 Anemia, unspecified: Secondary | ICD-10-CM

## 2023-01-22 LAB — IRON AND TIBC
Iron: 20 ug/dL — ABNORMAL LOW (ref 45–182)
Saturation Ratios: 4 % — ABNORMAL LOW (ref 17.9–39.5)
TIBC: 497 ug/dL — ABNORMAL HIGH (ref 250–450)
UIBC: 477 ug/dL

## 2023-01-22 LAB — COMPREHENSIVE METABOLIC PANEL
ALT: 21 U/L (ref 0–44)
AST: 18 U/L (ref 15–41)
Albumin: 4.1 g/dL (ref 3.5–5.0)
Alkaline Phosphatase: 40 U/L (ref 38–126)
Anion gap: 13 (ref 5–15)
BUN: 46 mg/dL — ABNORMAL HIGH (ref 8–23)
CO2: 19 mmol/L — ABNORMAL LOW (ref 22–32)
Calcium: 9.4 mg/dL (ref 8.9–10.3)
Chloride: 100 mmol/L (ref 98–111)
Creatinine, Ser: 1.69 mg/dL — ABNORMAL HIGH (ref 0.61–1.24)
GFR, Estimated: 40 mL/min — ABNORMAL LOW (ref >60)
Glucose, Bld: 97 mg/dL (ref 70–99)
Potassium: 4 mmol/L (ref 3.5–5.1)
Sodium: 132 mmol/L — ABNORMAL LOW (ref 135–145)
Total Bilirubin: 0.4 mg/dL (ref 0.3–1.2)
Total Protein: 7 g/dL (ref 6.5–8.1)

## 2023-01-22 LAB — POC OCCULT BLOOD, ED: Fecal Occult Bld: POSITIVE — AB

## 2023-01-22 LAB — RETICULOCYTES
Immature Retic Fract: 21.4 % — ABNORMAL HIGH (ref 2.3–15.9)
RBC.: 2.66 MIL/uL — ABNORMAL LOW (ref 4.22–5.81)
Retic Count, Absolute: 45.2 10*3/uL (ref 19.0–186.0)
Retic Ct Pct: 1.7 % (ref 0.4–3.1)

## 2023-01-22 LAB — CBC WITH DIFFERENTIAL/PLATELET
Abs Immature Granulocytes: 0.01 10*3/uL (ref 0.00–0.07)
Basophils Absolute: 0.1 10*3/uL (ref 0.0–0.1)
Basophils Relative: 1 %
Eosinophils Absolute: 0 10*3/uL (ref 0.0–0.5)
Eosinophils Relative: 0 %
HCT: 23.2 % — ABNORMAL LOW (ref 39.0–52.0)
Hemoglobin: 7.2 g/dL — ABNORMAL LOW (ref 13.0–17.0)
Immature Granulocytes: 0 %
Lymphocytes Relative: 15 %
Lymphs Abs: 1.1 10*3/uL (ref 0.7–4.0)
MCH: 25.4 pg — ABNORMAL LOW (ref 26.0–34.0)
MCHC: 31 g/dL (ref 30.0–36.0)
MCV: 81.7 fL (ref 80.0–100.0)
Monocytes Absolute: 0.6 10*3/uL (ref 0.1–1.0)
Monocytes Relative: 8 %
Neutro Abs: 5.6 10*3/uL (ref 1.7–7.7)
Neutrophils Relative %: 76 %
Platelets: 474 10*3/uL — ABNORMAL HIGH (ref 150–400)
RBC: 2.84 MIL/uL — ABNORMAL LOW (ref 4.22–5.81)
RDW: 17 % — ABNORMAL HIGH (ref 11.5–15.5)
WBC: 7.4 10*3/uL (ref 4.0–10.5)
nRBC: 0 % (ref 0.0–0.2)

## 2023-01-22 LAB — TROPONIN I (HIGH SENSITIVITY)
Troponin I (High Sensitivity): 5 ng/L (ref <18)
Troponin I (High Sensitivity): 6 ng/L (ref <18)

## 2023-01-22 LAB — PREPARE RBC (CROSSMATCH)

## 2023-01-22 LAB — RESP PANEL BY RT-PCR (RSV, FLU A&B, COVID)  RVPGX2
Influenza A by PCR: NEGATIVE
Influenza B by PCR: NEGATIVE
Resp Syncytial Virus by PCR: NEGATIVE
SARS Coronavirus 2 by RT PCR: NEGATIVE

## 2023-01-22 LAB — VITAMIN B12: Vitamin B-12: 1304 pg/mL — ABNORMAL HIGH (ref 180–914)

## 2023-01-22 LAB — FOLATE: Folate: >40 ng/mL (ref >5.9)

## 2023-01-22 LAB — FERRITIN: Ferritin: 6 ng/mL — ABNORMAL LOW (ref 24–336)

## 2023-01-22 MED ORDER — SODIUM CHLORIDE 0.9% IV SOLUTION: Freq: Once | INTRAVENOUS | Status: DC

## 2023-01-22 NOTE — Discharge Instructions (Addendum)
It was our pleasure to provide your ER care today - we hope that you feel better.  Take your iron as prescribed by your doctor. Drink plenty of fluids/stay well hydrated. Continue your protonix (acid blocker medication).  Avoid taking any nsaid type medication such as ibuprofen/motrin or naprosyn/aleve.   Follow up closely with primary care doctor in the next 2-3 days for recheck - have them recheck your lab work/blood count.  Also, your stool, although brown in color did test positive for blood.  Follow up closely with GI doctor in one week.   Return to ER if reconsider and wish to be admitted, or if worse, new symptoms, fevers, new or severe pain, weak/fainting, chest pain, cough, trouble breathing, or other emergency concern.

## 2023-01-22 NOTE — ED Notes (Signed)
Pt refused to have labs drawn RN notified

## 2023-01-22 NOTE — ED Provider Notes (Signed)
[facility] Provider Note   CSN: [id number] Arrival date & time: 01/22/23  1228     History  Chief Complaint  Patient presents with   Fatigue    Russell [name] is a 82 y.o. male.  Pt c/o general weakness in the past 1-2 months. Symptoms gradual onset, constant, persistent. Occasionally feels lightheaded when stands. Denies acute or abrupt worsening of symptoms. No focal or unilateral numbness or weakness. No change in speech or vision. No new problems w balance, gait or normal functional ability. Denies headache. No cough, sore throat or uri symptoms. No fever or chills. No chest pain or discomfort. No sob. No abd pain or vomiting/diarrhea. No dysuria or gu c/o. No extremity pain or swelling.  Denies recent blood loss, rectal bleeding or melena. Pt indicates hx low fe/anemia, but that he has iron at home but not taking.   The history is provided by the patient and medical records.       Home Medications Prior to Admission medications   Medication Sig Start Date End Date Taking? Authorizing Provider  acetaminophen (TYLENOL) 325 MG tablet Take 2 tablets (650 mg total) by mouth every 4 (four) hours as needed for mild pain (or temp > 37.5 C (99.5 F)). 07/10/21   Samuella Cota, MD  ALPHAGAN P 0.1 % SOLN Place 1 drop into the right eye 3 (three) times daily. 03/23/22   [provider]  bimatoprost (LUMIGAN) 0.01 % SOLN Place 1 drop into both eyes at bedtime. 04/10/13   Dhungel, Nishant, MD  brimonidine (ALPHAGAN) 0.2 % ophthalmic solution Place 1 drop into both eyes 3 (three) times daily. 03/14/19   [provider]  Brinzolamide-Brimonidine (SIMBRINZA) 1-0.2 % SUSP Place 1 drop into both eyes 3 (three) times daily.    [provider]  clopidogrel (PLAVIX) 75 MG tablet TAKE 1 TABLET BY MOUTH EVERY DAY 06/01/22   Waynetta Sandy, MD  ferrous sulfate 325 (65 FE) MG tablet Take 1 tablet (325 mg total) by mouth  every other day for 100 doses. 02/16/22 09/04/22  Precious Gilding, DO  gabapentin (NEURONTIN) 300 MG capsule Take 300 mg by mouth daily. 02/25/21   [provider]  losartan (COZAAR) 100 MG tablet Take 100 mg by mouth daily. 04/13/22   [provider]  meclizine (ANTIVERT) 25 MG tablet Take 25 mg by mouth daily as needed. 11/24/22   [provider]  Multiple Vitamin (MULTIVITAMIN WITH MINERALS) TABS tablet Take 1 tablet by mouth daily. One a day    [provider]  MYRBETRIQ 25 MG TB24 tablet Take 25 mg by mouth daily. 02/12/20   [provider]  oxybutynin (DITROPAN-XL) 10 MG 24 hr tablet Take 10 mg by mouth daily. 11/22/22   [provider]  oxyCODONE-acetaminophen (PERCOCET) 10-325 MG tablet Take 1 tablet by mouth every 4 (four) hours as needed for pain. 07/28/22   Hyatt, Max T, DPM  pantoprazole (PROTONIX) 40 MG tablet Take 1 tablet (40 mg total) by mouth daily. 02/04/22 03/06/22  Gerrit Heck, MD  pravastatin (PRAVACHOL) 80 MG tablet Take 80 mg by mouth daily.  10/19/16   [provider]  tamsulosin (FLOMAX) 0.4 MG CAPS capsule Take by mouth daily. 10/23/22   [provider]      Allergies    Tramadol    Review of Systems   Review of Systems  Constitutional:  Negative for fever.  HENT:  Negative for sore throat.  Eyes:  Negative for redness.  Respiratory:  Negative for cough and shortness of breath.   Cardiovascular:  Negative for chest pain.  Gastrointestinal:  Negative for abdominal pain, blood in stool, diarrhea and vomiting.  Genitourinary:  Negative for dysuria and flank pain.  Musculoskeletal:  Negative for back pain and neck pain.  Skin:  Negative for rash.  Neurological:  Negative for speech difficulty and headaches.  Hematological:  Does not bruise/bleed easily.  Psychiatric/Behavioral:  Negative for confusion.     Physical Exam Updated Vital Signs BP (!) 116/53   Pulse 70   Temp 98.3 F (36.8 C) (Oral)    Resp 15   SpO2 92%  Physical Exam Vitals and nursing note reviewed.  Constitutional:      Appearance: Normal appearance. He is well-developed.  HENT:     Head: Atraumatic.     Nose: Nose normal.     Mouth/Throat:     Mouth: Mucous membranes are moist.     Pharynx: Oropharynx is clear.  Eyes:     General: No scleral icterus.    Conjunctiva/sclera: Conjunctivae normal.     Pupils: Pupils are equal, round, and reactive to light.  Neck:     Vascular: No carotid bruit.     Trachea: No tracheal deviation.  Cardiovascular:     Rate and Rhythm: Normal rate and regular rhythm.     Pulses: Normal pulses.     Heart sounds: Normal heart sounds. No murmur heard.    No friction rub. No gallop.  Pulmonary:     Effort: Pulmonary effort is normal. No accessory muscle usage or respiratory distress.     Breath sounds: Normal breath sounds.  Abdominal:     General: Bowel sounds are normal. There is no distension.     Palpations: Abdomen is soft. There is no mass.     Tenderness: There is no abdominal tenderness.  Genitourinary:    Comments: No cva tenderness. Medium brown stool, no mass felt, no gross blood or melena - tests heme pos.  Musculoskeletal:        General: No swelling or tenderness.     Cervical back: Normal range of motion and neck supple. No rigidity.     Right lower leg: No edema.     Left lower leg: No edema.  Skin:    General: Skin is warm and dry.     Findings: No rash.  Neurological:     Mental Status: He is alert.     Comments: Alert, speech clear. Motor/sens grossly intact bil. Steady gait.   Psychiatric:        Mood and Affect: Mood normal.    ED Results / Procedures / Treatments   Labs (all labs ordered are listed, but only abnormal results are displayed) Results for orders placed or performed during the hospital encounter of 01/22/23  Resp panel by RT-PCR (RSV, Flu A&B, Covid) Anterior Nasal Swab   Specimen: Anterior Nasal Swab  Result Value Ref Range   SARS  Coronavirus 2 by RT PCR NEGATIVE NEGATIVE   Influenza A by PCR NEGATIVE NEGATIVE   Influenza B by PCR NEGATIVE NEGATIVE   Resp Syncytial Virus by PCR NEGATIVE NEGATIVE  Comprehensive metabolic panel  Result Value Ref Range   Sodium 132 (L) 135 - 145 mmol/L   Potassium 4.0 3.5 - 5.1 mmol/L   Chloride 100 98 - 111 mmol/L   CO2 19 (L) 22 - 32 mmol/L   Glucose, Bld 97 70 - 99  mg/dL   BUN 46 (H) 8 - 23 mg/dL   Creatinine, Ser 1.69 (H) 0.61 - 1.24 mg/dL   Calcium 9.4 8.9 - 10.3 mg/dL   Total Protein 7.0 6.5 - 8.1 g/dL   Albumin 4.1 3.5 - 5.0 g/dL   AST 18 15 - 41 U/L   ALT 21 0 - 44 U/L   Alkaline Phosphatase 40 38 - 126 U/L   Total Bilirubin 0.4 0.3 - 1.2 mg/dL   GFR, Estimated 40 (L) >60 mL/min   Anion gap 13 5 - 15  CBC with Differential  Result Value Ref Range   WBC 7.4 4.0 - 10.5 K/uL   RBC 2.84 (L) 4.22 - 5.81 MIL/uL   Hemoglobin 7.2 (L) 13.0 - 17.0 g/dL   HCT 23.2 (L) 39.0 - 52.0 %   MCV 81.7 80.0 - 100.0 fL   MCH 25.4 (L) 26.0 - 34.0 pg   MCHC 31.0 30.0 - 36.0 g/dL   RDW 17.0 (H) 11.5 - 15.5 %   Platelets 474 (H) 150 - 400 K/uL   nRBC 0.0 0.0 - 0.2 %   Neutrophils Relative % 76 %   Neutro Abs 5.6 1.7 - 7.7 K/uL   Lymphocytes Relative 15 %   Lymphs Abs 1.1 0.7 - 4.0 K/uL   Monocytes Relative 8 %   Monocytes Absolute 0.6 0.1 - 1.0 K/uL   Eosinophils Relative 0 %   Eosinophils Absolute 0.0 0.0 - 0.5 K/uL   Basophils Relative 1 %   Basophils Absolute 0.1 0.0 - 0.1 K/uL   Immature Granulocytes 0 %   Abs Immature Granulocytes 0.01 0.00 - 0.07 K/uL  Vitamin B12  Result Value Ref Range   Vitamin B-12 1,304 (H) 180 - 914 pg/mL  Folate  Result Value Ref Range   Folate >40.0 >5.9 ng/mL  Reticulocytes  Result Value Ref Range   Retic Ct Pct 1.7 0.4 - 3.1 %   RBC. 2.66 (L) 4.22 - 5.81 MIL/uL   Retic Count, Absolute 45.2 19.0 - 186.0 K/uL   Immature Retic Fract 21.4 (H) 2.3 - 15.9 %  Iron and TIBC  Result Value Ref Range   Iron 20 (L) 45 - 182 ug/dL   TIBC 497 (H) 250  - 450 ug/dL   Saturation Ratios 4 (L) 17.9 - 39.5 %   UIBC 477 ug/dL  Ferritin  Result Value Ref Range   Ferritin 6 (L) 24 - 336 ng/mL  POC occult blood, ED Provider will collect  Result Value Ref Range   Fecal Occult Bld POSITIVE (A) NEGATIVE  Type and screen [facility]  Result Value Ref Range   ABO/RH(D) O POS    Antibody Screen NEG    Sample Expiration 01/25/2023,2359    Unit Number [id number]    Blood Component Type RED CELLS,LR    Unit division 00    Status of Unit ISSUED    Transfusion Status OK TO TRANSFUSE    Crossmatch Result      Compatible Performed at [facility], [address] [place], [place] [address]   Prepare RBC (crossmatch)  Result Value Ref Range   Order Confirmation      ORDER PROCESSED BY BLOOD BANK Performed at [facility], [place] [address] [place], [place] [address]   BPAM [facility]  Result Value Ref Range   ISSUE DATE / TIME [id number]    Blood Product Unit Number [id number]    PRODUCT CODE A1937T02    Unit  Type and Rh 5100    Blood Product Expiration Date [id number]   Troponin I (High Sensitivity)  Result Value Ref Range   Troponin I (High Sensitivity) 5 <18 ng/L  Troponin I (High Sensitivity)  Result Value Ref Range   Troponin I (High Sensitivity) 6 <18 ng/L   DG Chest 1 View  Result Date: 01/22/2023 CLINICAL DATA:  Left arm pain EXAM: CHEST  1 VIEW COMPARISON:  07/22/2022 FINDINGS: Hazy bilateral lower lobe airspace disease concerning for pneumonia. No pleural effusion or pneumothorax. Heart and mediastinal contours are unremarkable. No acute osseous abnormality. IMPRESSION: Hazy bilateral lower lobe airspace disease concerning for pneumonia. Electronically Signed   By: Kathreen Devoid M.D.   On: 01/22/2023 14:02     EKG EKG Interpretation  Date/Time:  Friday January 22 2023 12:16:39 EST Ventricular Rate:  72 PR Interval:  166 QRS Duration: 98 QT Interval:  404 QTC Calculation: 442 R  Axis:   73 Text Interpretation: Sinus rhythm with frequent Premature ventricular complexes Left ventricular hypertrophy Non-specific ST-t changes Confirmed by Lajean Saver [phone]) on 01/22/2023 3:18:11 PM  Radiology DG Chest 1 View  Result Date: 01/22/2023 CLINICAL DATA:  Left arm pain EXAM: CHEST  1 VIEW COMPARISON:  07/22/2022 FINDINGS: Hazy bilateral lower lobe airspace disease concerning for pneumonia. No pleural effusion or pneumothorax. Heart and mediastinal contours are unremarkable. No acute osseous abnormality. IMPRESSION: Hazy bilateral lower lobe airspace disease concerning for pneumonia. Electronically Signed   By: Kathreen Devoid M.D.   On: 01/22/2023 14:02    Procedures Procedures    Medications Ordered in ED Medications  0.9 %  sodium chloride infusion (Manually program via Guardrails IV Fluids) (has no administration in time range)    ED Course/ Medical Decision Making/ A&P                             Medical Decision Making Problems Addressed: Generalized weakness: acute illness or injury    Details: Acute/chronic Heme positive stool: acute illness or injury Iron deficiency anemia, unspecified iron deficiency anemia type: chronic illness or injury with exacerbation, progression, or side effects of treatment that poses a threat to life or bodily functions Stage 3b chronic kidney disease ([place]): chronic illness or injury that poses a threat to life or bodily functions Symptomatic anemia: chronic illness or injury with exacerbation, progression, or side effects of treatment that poses a threat to life or bodily functions  Amount and/or Complexity of Data Reviewed Independent Historian: EMS    Details: hx External Data Reviewed: notes. Labs: ordered. Decision-making details documented in ED Course. Radiology: ordered and independent interpretation performed. Decision-making details documented in ED Course. ECG/medicine tests: ordered and independent interpretation  performed. Decision-making details documented in ED Course.  Risk Prescription drug management. Decision regarding hospitalization.   Iv ns. Continuous pulse ox and cardiac monitoring. Labs ordered/sent. Imaging ordered.   Differential diagnosis includes anemia, dehydration, viral syndrome, occult malignancy, etc. Dispo decision including potential need for admission considered - will get labs and imaging and reassess.   Reviewed nursing notes and prior charts for additional history. External reports reviewed. Additional history from: EMS.   Cardiac monitor: sinus rhythm, rate  Labs reviewed/interpreted by me - chem c/w prior, ckd.  Anemia c/w prior, long hx iron def anemia, current hct 23.   Xrays reviewed/interpreted by me - ?atelectasis vs infiltrate bases - pt denies any cough or any fever/chills/sweats.   LR bolus. Pt asking for  food/drink - provided.   Given mild drop in hgb, symptoms, ?symptomatic anemia, will transfuse prbc emergently.  1 unit prbc transfusion.  Recheck pt, discussed/offered and recommended plan for admission - discussed risks, drop in gb/bleeding, etc - pt indicates feels much improved and is requesting d/c,  and does not want to remain in hospital/ED.    Pt ambulated  in hall. No faintness or dizziness. Tolerating po. Vitals normal.  Again offered, admission - pt refuses, requests d/c, indicates his ride is on the way here.  Rec close pcp/gi f/u.  Return precautions provided.   CRITICAL CARE RE: symptomatic anemia requiring emergent prbc transfusion.  Performed by: Mirna Mires Total critical care time: 40 minutes Critical care time was exclusive of separately billable procedures and treating other patients. Critical care was necessary to treat or prevent imminent or life-threatening deterioration. Critical care was time spent personally by me on the following activities: development of treatment plan with patient and/or surrogate as well as nursing,  discussions with consultants, evaluation of patient's response to treatment, examination of patient, obtaining history from patient or surrogate, ordering and performing treatments and interventions, ordering and review of laboratory studies, ordering and review of radiographic studies, pulse oximetry and re-evaluation of patient's condition.          Final Clinical Impression(s) / ED Diagnoses Final diagnoses:  Generalized weakness  Symptomatic anemia  Iron deficiency anemia, unspecified iron deficiency anemia type  Stage 3b chronic kidney disease (HCC)  Heme positive stool    Rx / DC Orders ED Discharge Orders     None         Lajean Saver, MD 01/22/23 2152

## 2023-01-22 NOTE — ED Provider Triage Note (Signed)
Emergency Medicine Provider Triage Evaluation Note  Russell [name] , a 82 y.o. male  was evaluated in triage.  Pt complains of nausea, fatigue and left arm pain with the lightheaded since last night, no chest pain or shortness of breath.  States he thinks he needs to have fluids and wants to lay down.  Review of Systems  Positive: Left arm pain, fatigue Negative: Syncope  Physical Exam  BP 102/67 (BP Location: Right Arm)   Pulse 64   Temp 98.4 F (36.9 C) (Oral)   Resp 15   SpO2 97%  Gen:   Awake, no distress   Resp:  Normal effort  MSK:   Moves extremities without difficulty  Other:    Medical Decision Making  Medically screening exam initiated at 1:25 PM.  Appropriate orders placed.  Russell [name] was informed that the remainder of the evaluation will be completed by another provider, this initial triage assessment does not replace that evaluation, and the importance of remaining in the ED until their evaluation is complete.     Russell [name] A, [place] 01/22/23 1329

## 2023-01-22 NOTE — ED Triage Notes (Signed)
Patient BIB [facility] from home for evaluation of malaise for the last four weeks. Patient reports dizziness with ambulation. Also reports poor PO intake. BP 100/62 sitting, 124/68 laying supine. Patient is alert, oriented, and in no apparent distress at this time.

## 2023-01-22 NOTE — ED Notes (Signed)
Pt's wife would like to be called with an update when someone is able. [phone]

## 2023-01-22 NOTE — ED Notes (Signed)
Patient reports that he doesn't understand why we are drawing so much blood and doing so many tests. He reports he was just concerned about an upset stomach.

## 2023-01-23 LAB — BPAM RBC
Blood Product Expiration Date: 202403012359
ISSUE DATE / TIME: 202402021847
Unit Type and Rh: 5100

## 2023-01-23 LAB — TYPE AND SCREEN
ABO/RH(D): O POS
Antibody Screen: NEGATIVE
Unit division: 0

## 2023-01-25 ENCOUNTER — Encounter: Payer: Self-pay | Admitting: Nurse Practitioner

## 2023-02-02 ENCOUNTER — Ambulatory Visit: Payer: 59 | Admitting: Podiatry

## 2023-02-02 ENCOUNTER — Other Ambulatory Visit: Payer: Self-pay | Admitting: Podiatry

## 2023-02-02 MED ORDER — OXYCODONE-ACETAMINOPHEN 10-325 MG PO TABS: 1.0000 | ORAL_TABLET | Freq: Three times a day (TID) | ORAL | 0 refills | Status: DC | PRN

## 2023-02-03 ENCOUNTER — Inpatient Hospital Stay (HOSPITAL_COMMUNITY): Payer: 59

## 2023-02-03 ENCOUNTER — Encounter (HOSPITAL_COMMUNITY): Payer: Self-pay

## 2023-02-03 ENCOUNTER — Inpatient Hospital Stay (HOSPITAL_COMMUNITY)
Admission: EM | Admit: 2023-02-03 | Discharge: 2023-02-06 | DRG: 378 | Disposition: A | Discharge status: Home / Self Care | Payer: 59 | Attending: Internal Medicine | Admitting: Internal Medicine

## 2023-02-03 DIAGNOSIS — I2510 Atherosclerotic heart disease of native coronary artery without angina pectoris: Secondary | ICD-10-CM | POA: Diagnosis present

## 2023-02-03 DIAGNOSIS — J449 Chronic obstructive pulmonary disease, unspecified: Secondary | ICD-10-CM | POA: Diagnosis present

## 2023-02-03 DIAGNOSIS — E785 Hyperlipidemia, unspecified: Secondary | ICD-10-CM | POA: Diagnosis present

## 2023-02-03 DIAGNOSIS — N1832 Chronic kidney disease, stage 3b: Secondary | ICD-10-CM | POA: Diagnosis present

## 2023-02-03 DIAGNOSIS — I5022 Chronic systolic (congestive) heart failure: Secondary | ICD-10-CM | POA: Diagnosis present

## 2023-02-03 DIAGNOSIS — R55 Syncope and collapse: Secondary | ICD-10-CM | POA: Diagnosis present

## 2023-02-03 DIAGNOSIS — Z91128 Patient's intentional underdosing of medication regimen for other reason: Secondary | ICD-10-CM | POA: Diagnosis not present

## 2023-02-03 DIAGNOSIS — D509 Iron deficiency anemia, unspecified: Secondary | ICD-10-CM | POA: Diagnosis present

## 2023-02-03 DIAGNOSIS — F32A Depression, unspecified: Secondary | ICD-10-CM | POA: Diagnosis present

## 2023-02-03 DIAGNOSIS — I700 Atherosclerosis of aorta: Secondary | ICD-10-CM | POA: Diagnosis present

## 2023-02-03 DIAGNOSIS — G40909 Epilepsy, unspecified, not intractable, without status epilepticus: Secondary | ICD-10-CM | POA: Diagnosis present

## 2023-02-03 DIAGNOSIS — D500 Iron deficiency anemia secondary to blood loss (chronic): Secondary | ICD-10-CM | POA: Diagnosis present

## 2023-02-03 DIAGNOSIS — K2950 Unspecified chronic gastritis without bleeding: Secondary | ICD-10-CM | POA: Diagnosis present

## 2023-02-03 DIAGNOSIS — I130 Hypertensive heart and chronic kidney disease with heart failure and stage 1 through stage 4 chronic kidney disease, or unspecified chronic kidney disease: Secondary | ICD-10-CM | POA: Diagnosis present

## 2023-02-03 DIAGNOSIS — K31811 Angiodysplasia of stomach and duodenum with bleeding: Principal | ICD-10-CM | POA: Diagnosis present

## 2023-02-03 DIAGNOSIS — Z681 Body mass index (BMI) 19 or less, adult: Secondary | ICD-10-CM | POA: Diagnosis not present

## 2023-02-03 DIAGNOSIS — K922 Gastrointestinal hemorrhage, unspecified: Secondary | ICD-10-CM

## 2023-02-03 DIAGNOSIS — I493 Ventricular premature depolarization: Secondary | ICD-10-CM | POA: Diagnosis present

## 2023-02-03 DIAGNOSIS — F1011 Alcohol abuse, in remission: Secondary | ICD-10-CM | POA: Diagnosis present

## 2023-02-03 DIAGNOSIS — E1151 Type 2 diabetes mellitus with diabetic peripheral angiopathy without gangrene: Secondary | ICD-10-CM | POA: Diagnosis present

## 2023-02-03 DIAGNOSIS — D62 Acute posthemorrhagic anemia: Secondary | ICD-10-CM | POA: Diagnosis present

## 2023-02-03 DIAGNOSIS — N390 Urinary tract infection, site not specified: Secondary | ICD-10-CM | POA: Diagnosis present

## 2023-02-03 DIAGNOSIS — T471X6A Underdosing of other antacids and anti-gastric-secretion drugs, initial encounter: Secondary | ICD-10-CM | POA: Diagnosis present

## 2023-02-03 DIAGNOSIS — R64 Cachexia: Secondary | ICD-10-CM | POA: Diagnosis present

## 2023-02-03 DIAGNOSIS — N189 Chronic kidney disease, unspecified: Secondary | ICD-10-CM | POA: Diagnosis present

## 2023-02-03 DIAGNOSIS — F17210 Nicotine dependence, cigarettes, uncomplicated: Secondary | ICD-10-CM | POA: Diagnosis present

## 2023-02-03 DIAGNOSIS — Z981 Arthrodesis status: Secondary | ICD-10-CM

## 2023-02-03 DIAGNOSIS — Z8546 Personal history of malignant neoplasm of prostate: Secondary | ICD-10-CM

## 2023-02-03 DIAGNOSIS — Z1152 Encounter for screening for COVID-19: Secondary | ICD-10-CM

## 2023-02-03 DIAGNOSIS — E861 Hypovolemia: Secondary | ICD-10-CM | POA: Diagnosis present

## 2023-02-03 DIAGNOSIS — Z7982 Long term (current) use of aspirin: Secondary | ICD-10-CM

## 2023-02-03 DIAGNOSIS — E1165 Type 2 diabetes mellitus with hyperglycemia: Secondary | ICD-10-CM | POA: Diagnosis present

## 2023-02-03 DIAGNOSIS — K219 Gastro-esophageal reflux disease without esophagitis: Secondary | ICD-10-CM | POA: Diagnosis present

## 2023-02-03 DIAGNOSIS — E1122 Type 2 diabetes mellitus with diabetic chronic kidney disease: Secondary | ICD-10-CM | POA: Diagnosis present

## 2023-02-03 DIAGNOSIS — N179 Acute kidney failure, unspecified: Secondary | ICD-10-CM | POA: Diagnosis present

## 2023-02-03 DIAGNOSIS — K449 Diaphragmatic hernia without obstruction or gangrene: Secondary | ICD-10-CM | POA: Diagnosis present

## 2023-02-03 DIAGNOSIS — K31819 Angiodysplasia of stomach and duodenum without bleeding: Secondary | ICD-10-CM

## 2023-02-03 DIAGNOSIS — F17200 Nicotine dependence, unspecified, uncomplicated: Secondary | ICD-10-CM | POA: Diagnosis present

## 2023-02-03 DIAGNOSIS — D649 Anemia, unspecified: Secondary | ICD-10-CM | POA: Diagnosis not present

## 2023-02-03 DIAGNOSIS — Z885 Allergy status to narcotic agent status: Secondary | ICD-10-CM

## 2023-02-03 DIAGNOSIS — Z79899 Other long term (current) drug therapy: Secondary | ICD-10-CM

## 2023-02-03 DIAGNOSIS — Z9181 History of falling: Secondary | ICD-10-CM

## 2023-02-03 DIAGNOSIS — Z961 Presence of intraocular lens: Secondary | ICD-10-CM | POA: Diagnosis present

## 2023-02-03 DIAGNOSIS — T45526A Underdosing of antithrombotic drugs, initial encounter: Secondary | ICD-10-CM | POA: Diagnosis present

## 2023-02-03 DIAGNOSIS — H409 Unspecified glaucoma: Secondary | ICD-10-CM | POA: Diagnosis present

## 2023-02-03 DIAGNOSIS — R296 Repeated falls: Secondary | ICD-10-CM | POA: Diagnosis present

## 2023-02-03 DIAGNOSIS — Z8249 Family history of ischemic heart disease and other diseases of the circulatory system: Secondary | ICD-10-CM

## 2023-02-03 DIAGNOSIS — G319 Degenerative disease of nervous system, unspecified: Secondary | ICD-10-CM | POA: Diagnosis not present

## 2023-02-03 DIAGNOSIS — D631 Anemia in chronic kidney disease: Secondary | ICD-10-CM | POA: Diagnosis present

## 2023-02-03 DIAGNOSIS — I951 Orthostatic hypotension: Secondary | ICD-10-CM | POA: Diagnosis present

## 2023-02-03 DIAGNOSIS — K5730 Diverticulosis of large intestine without perforation or abscess without bleeding: Secondary | ICD-10-CM | POA: Diagnosis present

## 2023-02-03 DIAGNOSIS — R001 Bradycardia, unspecified: Secondary | ICD-10-CM | POA: Diagnosis not present

## 2023-02-03 DIAGNOSIS — I10 Essential (primary) hypertension: Secondary | ICD-10-CM | POA: Diagnosis not present

## 2023-02-03 DIAGNOSIS — Z538 Procedure and treatment not carried out for other reasons: Secondary | ICD-10-CM | POA: Diagnosis present

## 2023-02-03 DIAGNOSIS — I739 Peripheral vascular disease, unspecified: Secondary | ICD-10-CM | POA: Diagnosis present

## 2023-02-03 DIAGNOSIS — Z833 Family history of diabetes mellitus: Secondary | ICD-10-CM

## 2023-02-03 LAB — PROTIME-INR
INR: 1.1 (ref 0.8–1.2)
Prothrombin Time: 13.6 seconds (ref 11.4–15.2)

## 2023-02-03 LAB — CBC WITH DIFFERENTIAL/PLATELET
Abs Immature Granulocytes: 0.03 10*3/uL (ref 0.00–0.07)
Basophils Absolute: 0.1 10*3/uL (ref 0.0–0.1)
Basophils Relative: 1 %
Eosinophils Absolute: 0.1 10*3/uL (ref 0.0–0.5)
Eosinophils Relative: 1 %
HCT: 23.2 % — ABNORMAL LOW (ref 39.0–52.0)
Hemoglobin: 7.1 g/dL — ABNORMAL LOW (ref 13.0–17.0)
Immature Granulocytes: 0 %
Lymphocytes Relative: 6 %
Lymphs Abs: 0.5 10*3/uL — ABNORMAL LOW (ref 0.7–4.0)
MCH: 23.9 pg — ABNORMAL LOW (ref 26.0–34.0)
MCHC: 30.6 g/dL (ref 30.0–36.0)
MCV: 78.1 fL — ABNORMAL LOW (ref 80.0–100.0)
Monocytes Absolute: 0.5 10*3/uL (ref 0.1–1.0)
Monocytes Relative: 6 %
Neutro Abs: 7.3 10*3/uL (ref 1.7–7.7)
Neutrophils Relative %: 86 %
Platelets: 359 10*3/uL (ref 150–400)
RBC: 2.97 MIL/uL — ABNORMAL LOW (ref 4.22–5.81)
RDW: 20.3 % — ABNORMAL HIGH (ref 11.5–15.5)
WBC: 8.5 10*3/uL (ref 4.0–10.5)
nRBC: 0 % (ref 0.0–0.2)

## 2023-02-03 LAB — TROPONIN I (HIGH SENSITIVITY)
Troponin I (High Sensitivity): 5 ng/L (ref <18)
Troponin I (High Sensitivity): 7 ng/L (ref <18)

## 2023-02-03 LAB — COMPREHENSIVE METABOLIC PANEL
ALT: 24 U/L (ref 0–44)
AST: 20 U/L (ref 15–41)
Albumin: 4.2 g/dL (ref 3.5–5.0)
Alkaline Phosphatase: 46 U/L (ref 38–126)
Anion gap: 14 (ref 5–15)
BUN: 39 mg/dL — ABNORMAL HIGH (ref 8–23)
CO2: 20 mmol/L — ABNORMAL LOW (ref 22–32)
Calcium: 9.7 mg/dL (ref 8.9–10.3)
Chloride: 100 mmol/L (ref 98–111)
Creatinine, Ser: 1.79 mg/dL — ABNORMAL HIGH (ref 0.61–1.24)
GFR, Estimated: 37 mL/min — ABNORMAL LOW (ref >60)
Glucose, Bld: 149 mg/dL — ABNORMAL HIGH (ref 70–99)
Potassium: 3.9 mmol/L (ref 3.5–5.1)
Sodium: 134 mmol/L — ABNORMAL LOW (ref 135–145)
Total Bilirubin: 0.2 mg/dL — ABNORMAL LOW (ref 0.3–1.2)
Total Protein: 7.3 g/dL (ref 6.5–8.1)

## 2023-02-03 LAB — RETICULOCYTES
Immature Retic Fract: 33.1 % — ABNORMAL HIGH (ref 2.3–15.9)
RBC.: 3.14 MIL/uL — ABNORMAL LOW (ref 4.22–5.81)
Retic Count, Absolute: 51.8 10*3/uL (ref 19.0–186.0)
Retic Ct Pct: 1.7 % (ref 0.4–3.1)

## 2023-02-03 LAB — PREPARE RBC (CROSSMATCH)

## 2023-02-03 LAB — LIPASE, BLOOD: Lipase: 35 U/L (ref 11–51)

## 2023-02-03 LAB — POC OCCULT BLOOD, ED: Fecal Occult Bld: POSITIVE — AB

## 2023-02-03 LAB — RESP PANEL BY RT-PCR (RSV, FLU A&B, COVID)  RVPGX2
Influenza A by PCR: NEGATIVE
Influenza B by PCR: NEGATIVE
Resp Syncytial Virus by PCR: NEGATIVE
SARS Coronavirus 2 by RT PCR: NEGATIVE

## 2023-02-03 LAB — TSH: TSH: 0.659 u[IU]/mL (ref 0.350–4.500)

## 2023-02-03 LAB — APTT: aPTT: 25 seconds (ref 24–36)

## 2023-02-03 MED ORDER — VANCOMYCIN HCL 1250 MG/250ML IV SOLN
1250.0000 mg | Freq: Once | INTRAVENOUS | Status: AC
  Administered 2023-02-03: 1250 mg via INTRAVENOUS
  Filled 2023-02-03: qty 250

## 2023-02-03 MED ORDER — ACETAMINOPHEN 325 MG PO TABS
650.0000 mg | ORAL_TABLET | Freq: Four times a day (QID) | ORAL | Status: DC | PRN
  Administered 2023-02-03 – 2023-02-06 (×2): 650 mg via ORAL
  Filled 2023-02-03 (×2): qty 2

## 2023-02-03 MED ORDER — SODIUM CHLORIDE 0.9% IV SOLUTION: Freq: Once | INTRAVENOUS | Status: AC

## 2023-02-03 MED ORDER — LACTATED RINGERS IV BOLUS: 1000.0000 mL | Freq: Once | INTRAVENOUS | Status: DC

## 2023-02-03 MED ORDER — SODIUM CHLORIDE 0.9 % IV SOLN
2.0000 g | INTRAVENOUS | Status: DC
  Administered 2023-02-03 – 2023-02-04 (×2): 2 g via INTRAVENOUS
  Filled 2023-02-03 (×2): qty 12.5

## 2023-02-03 MED ORDER — NICOTINE 21 MG/24HR TD PT24
21.0000 mg | MEDICATED_PATCH | Freq: Every day | TRANSDERMAL | Status: DC
  Administered 2023-02-03 – 2023-02-06 (×4): 21 mg via TRANSDERMAL
  Filled 2023-02-03 (×4): qty 1

## 2023-02-03 MED ORDER — PEG-KCL-NACL-NASULF-NA ASC-C 100 G PO SOLR
0.5000 | Freq: Once | ORAL | Status: AC
  Administered 2023-02-04: 100 g via ORAL
  Filled 2023-02-03: qty 1

## 2023-02-03 MED ORDER — PANTOPRAZOLE SODIUM 40 MG IV SOLR
40.0000 mg | Freq: Once | INTRAVENOUS | Status: AC
  Administered 2023-02-03: 40 mg via INTRAVENOUS
  Filled 2023-02-03: qty 10

## 2023-02-03 MED ORDER — PEG-KCL-NACL-NASULF-NA ASC-C 100 G PO SOLR
0.5000 | Freq: Once | ORAL | Status: AC
  Administered 2023-02-03: 100 g via ORAL
  Filled 2023-02-03: qty 1

## 2023-02-03 MED ORDER — SODIUM CHLORIDE 0.9 % IV SOLN: 2.0000 g | Freq: Two times a day (BID) | INTRAVENOUS | Status: DC

## 2023-02-03 MED ORDER — ONDANSETRON HCL 4 MG/2ML IJ SOLN: 4.0000 mg | Freq: Three times a day (TID) | INTRAMUSCULAR | Status: DC | PRN

## 2023-02-03 MED ORDER — VANCOMYCIN HCL 1250 MG/250ML IV SOLN: 1250.0000 mg | INTRAVENOUS | Status: DC

## 2023-02-03 MED ORDER — LACTATED RINGERS IV BOLUS
1000.0000 mL | Freq: Once | INTRAVENOUS | Status: AC
  Administered 2023-02-03: 1000 mL via INTRAVENOUS

## 2023-02-03 MED ORDER — LATANOPROST 0.005 % OP SOLN
1.0000 [drp] | Freq: Every day | OPHTHALMIC | Status: DC
  Administered 2023-02-03 – 2023-02-05 (×3): 1 [drp] via OPHTHALMIC
  Filled 2023-02-03: qty 2.5

## 2023-02-03 MED ORDER — THIAMINE HCL 100 MG/ML IJ SOLN
100.0000 mg | Freq: Every day | INTRAMUSCULAR | Status: DC
  Administered 2023-02-03: 100 mg via INTRAVENOUS
  Filled 2023-02-03: qty 2

## 2023-02-03 MED ORDER — PRAVASTATIN SODIUM 40 MG PO TABS
80.0000 mg | ORAL_TABLET | Freq: Every day | ORAL | Status: DC
  Administered 2023-02-05 – 2023-02-06 (×2): 80 mg via ORAL
  Filled 2023-02-03 (×3): qty 2

## 2023-02-03 MED ORDER — BRIMONIDINE TARTRATE 0.15 % OP SOLN
1.0000 [drp] | Freq: Three times a day (TID) | OPHTHALMIC | Status: DC
  Administered 2023-02-03 – 2023-02-06 (×8): 1 [drp] via OPHTHALMIC
  Filled 2023-02-03: qty 5

## 2023-02-03 MED ORDER — FOLIC ACID 5 MG/ML IJ SOLN
1.0000 mg | Freq: Every day | INTRAMUSCULAR | Status: DC
  Filled 2023-02-03: qty 0.2

## 2023-02-03 MED ORDER — BISACODYL 5 MG PO TBEC
10.0000 mg | DELAYED_RELEASE_TABLET | Freq: Once | ORAL | Status: AC
  Administered 2023-02-03: 10 mg via ORAL
  Filled 2023-02-03: qty 2

## 2023-02-03 MED ORDER — ADULT MULTIVITAMIN W/MINERALS CH
1.0000 | ORAL_TABLET | Freq: Every day | ORAL | Status: DC
  Filled 2023-02-03: qty 1

## 2023-02-03 MED ORDER — PEG-KCL-NACL-NASULF-NA ASC-C 100 G PO SOLR: 1.0000 | Freq: Once | ORAL | Status: DC

## 2023-02-03 MED ORDER — PANTOPRAZOLE SODIUM 40 MG IV SOLR
40.0000 mg | Freq: Two times a day (BID) | INTRAVENOUS | Status: DC
  Administered 2023-02-03 – 2023-02-04 (×3): 40 mg via INTRAVENOUS
  Filled 2023-02-03 (×3): qty 10

## 2023-02-03 MED ORDER — LACTATED RINGERS IV SOLN: INTRAVENOUS | Status: AC

## 2023-02-03 NOTE — Progress Notes (Signed)
Pharmacy Antibiotic Note  Russell [name] is a 82 y.o. male admitted on 02/03/2023 with sepsis.  Pharmacy has been consulted for Vancomycin / Cefepime  dosing.  Plan: Cefepime 1 gram iv Q 24 hours Vancomycin 1250 mg iv Q 48 hours  (AUC of 511 using Scr 1.79)  Height: 6' 2"$  (188 cm) Weight: 63.7 kg (140 lb 8 oz) IBW/kg (Calculated) : 82.2  Temp (24hrs), Avg:99.1 F (37.3 C), Min:98.1 F (36.7 C), Max:102.2 F (39 C)  Recent Labs  Lab 02/03/23 1310  WBC 8.5  CREATININE 1.79*    Estimated Creatinine Clearance: 28.7 mL/min (A) (by C-G formula based on SCr of 1.79 mg/dL (H)).    Allergies  Allergen Reactions   Ultram [Tramadol] Other (See Comments)    Confusion    Thank you Anette Guarneri, PharmD  02/03/2023 8:22 PM

## 2023-02-03 NOTE — H&P (View-Only) (Signed)
Referring Provider: Dr. Teressa Lower Primary Care Physician:  Marcie Mowers, [place] Primary Gastroenterologist:  Dr. Melina Copa [facility] 2009  Reason for Consultation:  Symptomatic anemia, + FOBT  HPI: Russell [name] is an 82 year old male with a past medical history of depression, aortic atherosclerosis, CHF, cerebral atrophy, COPD, seizure disorder, subdural hematomas with frequent falls, glaucoma, diabetes mellitus type 2, CKD stage 3b, PAD (no longer on Plavix), prostate cancer, remote alcohol use disorder (abstinent x 19 years), chronic IDA, GERD and diverticulosis.  He is a challenging historian therefore a significant portion of his history was obtained through his EPIC records.  No family at the bedside.  He lives with his wife.  He presented to the ED 01/22/2023 due to having generalized weakness.  Laboratory studies showed anemia with a Hg level of 7.2 (Hg 7.8 on 07/22/2022). Hematocrit 23.2.  Platelet 474.  BUN 46 . Cr 1.69.  Iron 20. Saturation ratios 4%.  TIBC 497.  Ferritin 6.  Vitamin B12 1304. FOBT positive. No melena or rectal bleeding. He was transfused 1 unit of PRBCs and hospital admission was recommended for further GI evaluation but he declined and he was discharged home.  He was scheduled for new patient office visit with me in our outpatient GI clinic on 02/23/2023.  He felt dizzy and lightheaded followed by a syncopal episode today while at the convenience store. He presented to [facility] ED via EMS for further evaluation. He was initially confused per EMS but upon arrival to the ED he was alert and oriented. BP 111/50. Heart rate in the 40s.  Labs in the ED showed a hemoglobin level of 7.1.  Hematocrit 23.2.  MCV 78.1.  Platelet 359.  Sodium 134.  Potassium 3.9.  BUN 39.  Creatinine 1.79.  Total bili 0.2.  Alk phos 46.  AST 20.  ALT 24.  FOBT positive.  A GI consult was requested for further evaluation regarding symptomatic iron deficiency anemia with suspected  chronic GI blood loss.  He denies having any dysphagia or heartburn. He sometimes has stomach pain which is vague and comes and goes for the past few months. He endorses passing black solid or loose stools once or twice daily for the past 6 months. He denies taking any oral iron or Pepto-Bismol.  He takes ASA 81 mg once daily. He is no longer on Plavix. Previously prescribed Pantoprazole 40 mg daily but he does not think he is taking it. He vomited of juice while he was in the ED, no hematemesis. No BM or melena since arriving to the ED. He underwent an EGD by Dr. Deatra Ina 08/2008 which showed a normal esophagus, retained food in the stomach and erosions in the gastric body and antrum. Gastric biopsies showed chronic gastritis without evidence of H. pylori. He underwent a colonoscopy by [facility] 08/2007 which showed diverticulosis to the sigmoid colon.  He denies having any further colonoscopies since then.  No known family history of esophageal, gastric or colon cancer.  He believes he is lost about 15 pounds over the past few months.  He has intermittent sweats during the day and nighttime.  In review of his EPIC records, he was admitted to the hospital 2/24 -02/16/2022 with syncope with associated fall and anemia. Syncope likely due to orthostasis, decreased p.o. intake and anemia. He was on Plavix at that time.  Head CT was negative at that time.  Hemoglobin level was 8.1. He was transfused units of PRBCs and received  IV Venofer. He also had generalized abdominal pain with constipation, CTAP without acute intraabdominal/pelvic pathology. His hemoglobin level was 8.9 on day of discharge and he was instructed to hold Plavix. He was prescribed ferrous sulfate p.o. every other day day.  A GI consult was not requested during this hospital admission.  ECHO 02/02/2022: LVEF 45 to 50%.  Normal RV function.  Mild MR.  PAST GI PROCEDURES:  EGD by Dr. Deatra Ina 09/11/2008: Normal esophagus, retained food found in the  stomach, erosions present to the gastric body and antrum Normal gastric empty study  Colonoscopy by [facility] 08/31/2007:  Diverticulosis in the sigmoid colon Repeat colonoscopy in 5 to 10 years   Past Medical History:  Diagnosis Date   AKI (acute kidney injury) ([place]) 06/23/2017   ARM PAIN 06/03/2010   Qualifier: Diagnosis of  By: Allean Found, LPN, [place], LUMBAR   Atherosclerosis of aorta ([place]) 02/14/2022   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   BPH with obstruction/lower urinary tract symptoms    BPH with urinary obstruction 01/15/2016   Cerebral atrophy ([place]) 02/14/2022   Cervical radiculopathy 01/27/2017   Chronic asymptomatic bacteriuria with pyuria 02/14/2022   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   COPD (chronic obstructive pulmonary disease) ([place])    Coronary atherosclerosis    denied knowledge of this 10/27/16   Depression    Diverticulosis of colon    DYSPHAGIA UNSPECIFIED 05/16/2009   Qualifier: Diagnosis of  By: Amil Amen MD, [place]     Dyspnea    EARLY SATIETY 11/04/2007   Qualifier: Diagnosis of  By: Amil Amen MD, Benjamine Mola     ED (erectile dysfunction)    Full dentures    GERD (gastroesophageal reflux disease)    not current   Glaucoma    History of gastritis    History of scabies    2008   History of seizure    2013-- x2   idiopathic --  none since   History of syncope    03-22-2014  DX  VAGAL RESPONSE   Hyperlipidemia, mixed    Hypertension    Mitral regurgitation    Peripheral arterial disease ([place])    one vessel runoff bilaterally via peroneal arteries   Prostate cancer ([place])    Seizures ([place]) 03/2014   ? or syncope- patient refused to be admitted for further studies- "felt better"   Type 2 diabetes, diet controlled ([place])    patient and his wife said no- have not been told 01/21/17   Wears glasses     Past Surgical History:  Procedure Laterality Date   ABDOMINAL AORTOGRAM W/LOWER EXTREMITY Left 12/18/2020    Procedure: ABDOMINAL AORTOGRAM W/LOWER EXTREMITY;  Surgeon: Cherre Robins, MD;  Location: [place] [facility];  Service: Cardiovascular;  Laterality: Left;   ABDOMINAL AORTOGRAM W/LOWER EXTREMITY N/A 02/26/2021   Procedure: ABDOMINAL AORTOGRAM W/LOWER EXTREMITY;  Surgeon: Cherre Robins, MD;  Location: [place] [facility];  Service: Cardiovascular;  Laterality: N/A;   ANTERIOR CERVICAL DECOMP/DISCECTOMY FUSION  10-16-2009   C4 -- C6   ANTERIOR CERVICAL DECOMP/DISCECTOMY FUSION N/A 01/27/2017   Procedure: ANTERIOR CERVICAL DECOMPRESSION FUSION CERVICAL 3-4 WITH INSTRUMENTATION AND ALLOGRAFT;  Surgeon: Phylliss Bob, MD;  Location: [place];  Service: Orthopedics;  Laterality: N/A;  ANTERIOR CERVICAL DECOMPRESSION FUSION CERVICAL 3-4 WITH INSTRUMENTATION AND ALLOGRAFT   CAPSULOTOMY Right 01/26/2013   Procedure: MINOR CAPSULOTOMY;  Surgeon: Myrtha Mantis., MD;  Location: [place];  Service: Ophthalmology;  Laterality:  Right;   CARPAL TUNNEL RELEASE Left 01/02/2019   Procedure: LEFT CARPAL TUNNEL RELEASE;  Surgeon: Leanora Cover, MD;  Location: [place];  Service: Orthopedics;  Laterality: Left;   CARPECTOMY Right 03/13/2014   Procedure: RIGHT  PROXIMAL ROW CARPECTOMY;  Surgeon: Tennis Must, MD;  Location: [place];  Service: Orthopedics;  Laterality: Right;   CARPECTOMY Left 10/22/2015   Procedure: LEFT WRIST PROXIMAL ROW CARPECTOMY ;  Surgeon: Leanora Cover, MD;  Location: [place];  Service: Orthopedics;  Laterality: Left;   CARPOMETACARPAL ([place]) FUSION OF THUMB Right 03/13/2014   Procedure: RIGHT FUSION OF THUMB CARPOMETACARPAL [facility]) JOINT;  Surgeon: Tennis Must, MD;  Location: [place];  Service: Orthopedics;  Laterality: Right;   CARPOMETACARPEL SUSPENSION PLASTY Left 04/04/2020   Procedure: LEFT THUMB TRAPECIECTOMY AND SUSPENSIONPLASTY;  Surgeon: Leanora Cover, MD;  Location: [place];  Service: Orthopedics;   Laterality: Left;   CATARACT EXTRACTION W/ INTRAOCULAR LENS  IMPLANT, BILATERAL  2009   COLONOSCOPY  08-31-2007   COLONOSCOPY     CRYOABLATION N/A 01/14/2015   Procedure: CRYO ABLATION PROSTATE;  Surgeon: Ailene Rud, MD;  Location: [facility];  Service: Urology;  Laterality: N/A;   ESOPHAGOGASTRODUODENOSCOPY  last one 09-11-2008   EXCISION MASS LEFT FACE , SUBORBITAL AREA, PLASTER RECONSTRUCTION  02-09-2011   EXCISIONAL DEBRIDEMENT AND REPAIR RIGHT QUADRICEP TENDON  07-05-2000   FOOT SURGERY Left    I & D EXTREMITY Right 05/24/2013   Procedure: RIGHT INDEX WOUND DEBRIDEMENT AND CLOSURE;  Surgeon: Jolyn Nap, MD;  Location: [place];  Service: Orthopedics;  Laterality: Right;   INSERTION OF SUPRAPUBIC CATHETER N/A 01/14/2015   Procedure: SUPRAPUBIC TUBE PLACEMENT;  Surgeon: Ailene Rud, MD;  Location: [facility];  Service: Urology;  Laterality: N/A;   KNEE ARTHROSCOPY Right 2008   LARYNGOSCOPY AND ESOPHAGOSCOPY  06-13-2010   MARSUPIALIZATION LEFT LARGE VALLECULAR CYST   MASS EXCISION Left 10/22/2015   Procedure: EXCISION MASS OF LEFT INDEX FINGER;  Surgeon: Leanora Cover, MD;  Location: [place];  Service: Orthopedics;  Laterality: Left;   ORCHIECTOMY Left 02/24/2015   Procedure: SCROTAL EXPLORATION WITH LEFT ORCHIECTOMY;  Surgeon: Kathie Rhodes, MD;  Location: [place];  Service: Urology;  Laterality: Left;   PERIPHERAL VASCULAR INTERVENTION Left 02/26/2021   Procedure: PERIPHERAL VASCULAR INTERVENTION;  Surgeon: Cherre Robins, MD;  Location: [place] [facility];  Service: Cardiovascular;  Laterality: Left;  Superficial femoral   POSTERIOR CERVICAL FUSION/FORAMINOTOMY N/A 01/28/2017   Procedure: POSTERIOR SPINAL FUSION CERVICAL 3-4, CERVICAL 4-5, CERVICAL 5-6, CERVICAL 6-7 WITH INSTRUMENATION AND ALLOGRAFT;  Surgeon: Phylliss Bob, MD;  Location: [place];  Service: Orthopedics;  Laterality: N/A;  POSTERIOR SPINAL FUSION  CERVICAL 3-4, CERVICAL 4-5, CERVICAL 5-6, CERVICAL 6-7 WITH INSTRUMENATION AND ALLOGRAFT   SHOULDER ARTHROSCOPY/ DEBRIDEMENT LABRAL TEAR/  BICEPS TENOTOMY Left 09-24-2011   TONSILLECTOMY  as child   TRANSTHORACIC ECHOCARDIOGRAM  02-21-2010   normal LVF/  ef 55-60%/ mild to moderate MR/  moderate LAE/  mild TR   YAG LASER APPLICATION Right [id number]   Procedure: YAG LASER APPLICATION;  Surgeon: Myrtha Mantis., MD;  Location: [facility];  Service: Ophthalmology;  Laterality: Right;   YAG LASER CAPSULOTOMY, LEFT EYE  01-08-2011    Prior to Admission medications   Medication Sig Start Date End Date Taking? Authorizing Provider  acetaminophen (TYLENOL) 325 MG tablet Take 2 tablets (650 mg total)  by mouth every 4 (four) hours as needed for mild pain (or temp > 37.5 C (99.5 F)). 07/10/21   Samuella Cota, MD  [facility] P 0.1 % SOLN Place 1 drop into the right eye 3 (three) times daily. 03/23/22   [provider]  bimatoprost (LUMIGAN) 0.01 % SOLN Place 1 drop into both eyes at bedtime. 04/10/13   Dhungel, Nishant, MD  brimonidine (ALPHAGAN) 0.2 % ophthalmic solution Place 1 drop into both eyes 3 (three) times daily. 03/14/19   [provider]  Brinzolamide-Brimonidine (SIMBRINZA) 1-0.2 % SUSP Place 1 drop into both eyes 3 (three) times daily.    [provider]  clopidogrel (PLAVIX) 75 MG tablet TAKE 1 TABLET BY MOUTH EVERY DAY 06/01/22   Waynetta Sandy, MD  ferrous sulfate 325 (65 FE) MG tablet Take 1 tablet (325 mg total) by mouth every other day for 100 doses. 02/16/22 09/04/22  Precious Gilding, DO  gabapentin (NEURONTIN) 300 MG capsule Take 300 mg by mouth daily. 02/25/21   [provider]  losartan (COZAAR) 100 MG tablet Take 100 mg by mouth daily. 04/13/22   [provider]  meclizine (ANTIVERT) 25 MG tablet Take 25 mg by mouth daily as needed. 11/24/22   [provider]  Multiple Vitamin (MULTIVITAMIN WITH MINERALS) TABS tablet Take 1  tablet by mouth daily. One a day    [provider]  MYRBETRIQ 25 MG TB24 tablet Take 25 mg by mouth daily. 02/12/20   [provider]  oxybutynin (DITROPAN-XL) 10 MG 24 hr tablet Take 10 mg by mouth daily. 11/22/22   [provider]  oxyCODONE-acetaminophen (PERCOCET) 10-325 MG tablet Take 1 tablet by mouth every 4 (four) hours as needed for pain. 07/28/22   Hyatt, Max T, DPM  oxyCODONE-acetaminophen (PERCOCET) 10-325 MG tablet Take 1 tablet by mouth every 8 (eight) hours as needed for up to 7 days for pain. 02/02/23 02/09/23  Hyatt, Max T, DPM  pantoprazole (PROTONIX) 40 MG tablet Take 1 tablet (40 mg total) by mouth daily. 02/04/22 03/06/22  Gerrit Heck, MD  pravastatin (PRAVACHOL) 80 MG tablet Take 80 mg by mouth daily.  10/19/16   [provider]  tamsulosin (FLOMAX) 0.4 MG CAPS capsule Take by mouth daily. 10/23/22   [provider]    Current Facility-Administered Medications  Medication Dose Route Frequency Provider Last Rate Last Admin   lactated ringers bolus 1,000 mL  1,000 mL Intravenous Once Kommor, Madison, MD       Current Outpatient Medications  Medication Sig Dispense Refill   acetaminophen (TYLENOL) 325 MG tablet Take 2 tablets (650 mg total) by mouth every 4 (four) hours as needed for mild pain (or temp > 37.5 C (99.5 F)).     ALPHAGAN P 0.1 % SOLN Place 1 drop into the right eye 3 (three) times daily.     bimatoprost (LUMIGAN) 0.01 % SOLN Place 1 drop into both eyes at bedtime. 30 mL 3   brimonidine (ALPHAGAN) 0.2 % ophthalmic solution Place 1 drop into both eyes 3 (three) times daily.     Brinzolamide-Brimonidine (SIMBRINZA) 1-0.2 % SUSP Place 1 drop into both eyes 3 (three) times daily.     clopidogrel (PLAVIX) 75 MG tablet TAKE 1 TABLET BY MOUTH EVERY DAY 30 tablet 3   ferrous sulfate 325 (65 FE) MG tablet Take 1 tablet (325 mg total) by mouth every other day for 100 doses. 100 tablet 0   gabapentin (NEURONTIN) 300 MG capsule Take  300 mg by mouth daily.     losartan (COZAAR) 100 MG tablet Take 100 mg by mouth daily.     meclizine (ANTIVERT) 25 MG tablet Take 25 mg by mouth daily as needed.     Multiple Vitamin (MULTIVITAMIN WITH MINERALS) TABS tablet Take 1 tablet by mouth daily. One a day     MYRBETRIQ 25 MG TB24 tablet Take 25 mg by mouth daily.     oxybutynin (DITROPAN-XL) 10 MG 24 hr tablet Take 10 mg by mouth daily.     oxyCODONE-acetaminophen (PERCOCET) 10-325 MG tablet Take 1 tablet by mouth every 4 (four) hours as needed for pain. 21 tablet 0   oxyCODONE-acetaminophen (PERCOCET) 10-325 MG tablet Take 1 tablet by mouth every 8 (eight) hours as needed for up to 7 days for pain. 21 tablet 0   pantoprazole (PROTONIX) 40 MG tablet Take 1 tablet (40 mg total) by mouth daily. 30 tablet 0   pravastatin (PRAVACHOL) 80 MG tablet Take 80 mg by mouth daily.   2   tamsulosin (FLOMAX) 0.4 MG CAPS capsule Take by mouth daily.      Allergies as of 02/03/2023 - Review Complete 02/03/2023  Allergen Reaction Noted   Tramadol  04/10/2020    Family History  Problem Relation Age of Onset   Unexplained death Mother        died at a young age, no known cause   Heart attack Father        age unknown   Diabetes Other    Cancer Other     Social History   Socioeconomic History   Marital status: Married    Spouse name: Not on file   Number of children: 3   Years of education: 12   Highest education level: High school graduate  Occupational History   Occupation: Retired - poured concrete  Tobacco Use   Smoking status: Every Day    Packs/day: 1.00    Years: 59.00    Total pack years: 59.00    Types: Cigarettes   Smokeless tobacco: Never   Tobacco comments:    Started Chantix 10/26/16  Vaping Use   Vaping Use: Never used  Substance and Sexual Activity   Alcohol use: No    Comment: none since approx 2014- heavy before   Drug use: No   Sexual activity: Not on file  Other Topics Concern   Not on file  Social  History Narrative   Lives with wife and 2 of his children in a one story home.  Has 3 children.  Retired Counsellor.  Education: high school.   Right Handed   Social Determinants of Health   Financial Resource Strain: Not on file  Food Insecurity: Not on file  Transportation Needs: Not on file  Physical Activity: Not on file  Stress: Not on file  Social Connections: Not on file  Intimate Partner Violence: Not on file    Review of Systems: Gen: See HPI. CV: Denies chest pain, palpitations or edema. Resp: Denies cough, shortness of breath of hemoptysis.  GI: See HPI.  GU : Denies urinary burning, blood in urine, increased urinary frequency or incontinence. MS: Denies joint pain, muscles aches or weakness. Derm: Denies rash, itchiness, skin lesions or unhealing ulcers. Psych: + Memory loss.  Heme: Denies easy bruising, bleeding. Neuro:  Denies headaches, dizziness or paresthesias. Endo:  + DM II.  Physical Exam: Vital signs in last 24 hours: Temp:  [98.1 F (36.7 C)-98.2 F (36.8 C)] 98.2  F (36.8 C) (02/14 1437) Pulse Rate:  [43-65] 65 (02/14 1437) Resp:  [16-20] 16 (02/14 1437) BP: (115-133)/(63-71) 115/71 (02/14 1437) SpO2:  [98 %-100 %] 100 % (02/14 1437)   General: Alert 82 year old male fatigued appearing in no acute distress. Head:  Normocephalic and atraumatic. Eyes:  No scleral icterus. Conjunctiva pink. Ears:  Normal auditory acuity. Nose:  No deformity, discharge or lesions. Mouth: Absent dentition.  No ulcers or lesions.  Neck:  Supple. No lymphadenopathy or thyromegaly.  Lungs: Breath sounds clear throughout. No wheezes, rhonchi or crackles.  Heart: Regular rate and rhythm, no murmurs. Abdomen:, Nontender.  Positive bowel sounds all 4 quadrants. Rectal: Deferred.  FOBT resulted positive. Musculoskeletal:  Symmetrical without gross deformities.  Pulses:  Normal pulses noted. Extremities:  Without clubbing or edema. Neurologic:  Alert and  oriented x  3.  Speech is clear, moves all extremities weakly. Skin:  Intact without significant lesions or rashes. Psych:  Alert and cooperative. Normal mood and affect.  Intake/Output from previous day: No intake/output data recorded. Intake/Output this shift: No intake/output data recorded.  Lab Results: Recent Labs    02/03/23 1310  WBC 8.5  HGB 7.1*  HCT 23.2*  PLT 359   BMET Recent Labs    02/03/23 1310  NA 134*  K 3.9  CL 100  CO2 20*  GLUCOSE 149*  BUN 39*  CREATININE 1.79*  CALCIUM 9.7   LFT Recent Labs    02/03/23 1310  PROT 7.3  ALBUMIN 4.2  AST 20  ALT 24  ALKPHOS 46  BILITOT 0.2*   PT/INR No results for input(s): "LABPROT", "INR" in the last 72 hours. Hepatitis Panel No results for input(s): "HEPBSAG", "HCVAB", "HEPAIGM", "HEPBIGM" in the last 72 hours.    Studies/Results: No results found.  IMPRESSION/PLAN:  82 year old male with recurrent syncope with IDA with reported melenic stools x 6 months. Hemoglobin 7.2. He received 1 unit of PRBCs.  No active GI bleeding since arriving to the ED. -Clear liquid diet -N.p.o. after midnight -EGD and colonoscopy benefits and risks discussed including risk with sedation, risk of bleeding, perforation and infection  -Check H&H every 6 hours x 24 hours -Transfuse for Hg < 8 -PPI IV bid -Ondansetron 4 mg p.o. or IV every 6 hours. -Check iron levels in am, he will likely require a IV iron during this admission  History of GERD, patient denies taking PPI at home -See plan above   PAD, on ASA. No longer on Plavix  CHF, LV EF 45 - 50% per ECHO 01/2022  COPD, stable   DM II  Noralyn Pick  02/03/2023, 4:20PM   I have taken a history, reviewed the chart and examined the patient. I performed a substantive portion of this encounter, including complete performance of at least one of the key components, in conjunction with the APP. I agree with the APP's note, impression and recommendations  82 year old  male with symptomatic iron deficiency anemia, with dark stools but no recent colonoscopy.  Although dark stools may suggest an upper GI source, a colonoscopy is also reasonable, especially considering he has presented with iron deficiency anemia without overt bleeding in the past.  He also reports a 10lb unintentional weight loss over the past few months and has a heavy smoking history.  Will plan for EGD and colonoscopy tomorrow.  The details, risks (including bleeding, perforation, infection, missed lesions, medication reactions and possible hospitalization or surgery if complications occur), benefits, and alternatives to  EGD/colonoscopy with possible biopsy and possible polypectomy were discussed with the patient and he consents to proceed.   Russell Iwan E. Candis Schatz, MD [facility]

## 2023-02-03 NOTE — ED Triage Notes (Signed)
Pt bib EMS. Pt states he was at the store when he felt weak and SOB. He then has witnessed syncopal episode outside. Pt was initially confused but alert on ems arrival with BP 111/50 and HR 40s. Pt reports weakness and abdominal pain x couple days. Pt seen here recently for blood transfusion related to GI bleed.

## 2023-02-03 NOTE — ED Notes (Signed)
ED TO INPATIENT HANDOFF REPORT  ED Nurse Name and Phone #: Providence Lanius  S Name/Age/Gender Russell [name] 82 y.o. male Room/Bed: 005C/005C  Code Status   Code Status: Full Code  Home/SNF/Other Home Patient oriented to: self, place, time, and situation Is this baseline? Yes   Triage Complete: Triage complete  Chief Complaint Symptomatic anemia [D64.9]  Triage Note Pt bib EMS. Pt states he was at the store when he felt weak and SOB. He then has witnessed syncopal episode outside. Pt was initially confused but alert on ems arrival with BP 111/50 and HR 40s. Pt reports weakness and abdominal pain x couple days. Pt seen here recently for blood transfusion related to GI bleed.    Allergies Allergies  Allergen Reactions   Tramadol     Confusion (intolerance)    Level of Care/Admitting Diagnosis ED Disposition     ED Disposition  Admit   Condition  --   Comment  Hospital Area: [facility] [[id number]]  Level of Care: Progressive [102]  Admit to Progressive based on following criteria: GI, ENDOCRINE disease patients with GI bleeding, acute liver failure or pancreatitis, stable with diabetic ketoacidosis or thyrotoxicosis (hypothyroid) state.  May admit patient to [facility] or [facility] if equivalent level of care is available:: No  Covid Evaluation: Asymptomatic - no recent exposure (last 10 days) testing not required  Diagnosis: Symptomatic anemia [id number]  Admitting Physician: Aldine Contes [id number]  Attending Physician: Aldine Contes [id number]  Certification:: I certify this patient will need inpatient services for at least 2 midnights  Estimated Length of Stay: 3          B Medical/Surgery History Past Medical History:  Diagnosis Date   AKI (acute kidney injury) ([place]) 06/23/2017   ARM PAIN 06/03/2010   Qualifier: Diagnosis of  By: Allean Found, LPN, [place], LUMBAR   Atherosclerosis of aorta ([place]) 02/14/2022    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   BPH with obstruction/lower urinary tract symptoms    BPH with urinary obstruction 01/15/2016   Cerebral atrophy ([place]) 02/14/2022   Cervical radiculopathy 01/27/2017   Chronic asymptomatic bacteriuria with pyuria 02/14/2022   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   COPD (chronic obstructive pulmonary disease) ([place])    Coronary atherosclerosis    denied knowledge of this 10/27/16   Depression    Diverticulosis of colon    DYSPHAGIA UNSPECIFIED 05/16/2009   Qualifier: Diagnosis of  By: Amil Amen MD, [place]     Dyspnea    EARLY SATIETY 11/04/2007   Qualifier: Diagnosis of  By: Amil Amen MD, Benjamine Mola     ED (erectile dysfunction)    Full dentures    GERD (gastroesophageal reflux disease)    not current   Glaucoma    History of gastritis    History of scabies    2008   History of seizure    2013-- x2   idiopathic --  none since   History of syncope    03-22-2014  DX  VAGAL RESPONSE   Hyperlipidemia, mixed    Hypertension    Mitral regurgitation    Peripheral arterial disease ([place])    one vessel runoff bilaterally via peroneal arteries   Prostate cancer ([place])    Seizures ([place]) 03/2014   ? or syncope- patient refused to be admitted for further studies- "felt better"   Type 2 diabetes, diet controlled ([place])    patient and his wife said no- have not  been told 01/21/17   Wears glasses    Past Surgical History:  Procedure Laterality Date   ABDOMINAL AORTOGRAM W/LOWER EXTREMITY Left 12/18/2020   Procedure: ABDOMINAL AORTOGRAM W/LOWER EXTREMITY;  Surgeon: Cherre Robins, MD;  Location: [facility];  Service: Cardiovascular;  Laterality: Left;   ABDOMINAL AORTOGRAM W/LOWER EXTREMITY N/A 02/26/2021   Procedure: ABDOMINAL AORTOGRAM W/LOWER EXTREMITY;  Surgeon: Cherre Robins, MD;  Location: [facility];  Service: Cardiovascular;  Laterality: N/A;   ANTERIOR CERVICAL DECOMP/DISCECTOMY FUSION  10-16-2009   C4 -- C6   ANTERIOR  CERVICAL DECOMP/DISCECTOMY FUSION N/A 01/27/2017   Procedure: ANTERIOR CERVICAL DECOMPRESSION FUSION CERVICAL 3-4 WITH INSTRUMENTATION AND ALLOGRAFT;  Surgeon: Phylliss Bob, MD;  Location: [facility];  Service: Orthopedics;  Laterality: N/A;  ANTERIOR CERVICAL DECOMPRESSION FUSION CERVICAL 3-4 WITH INSTRUMENTATION AND ALLOGRAFT   CAPSULOTOMY Right 01/26/2013   Procedure: MINOR CAPSULOTOMY;  Surgeon: Myrtha Mantis., MD;  Location: [facility];  Service: Ophthalmology;  Laterality: Right;   CARPAL TUNNEL RELEASE Left 01/02/2019   Procedure: LEFT CARPAL TUNNEL RELEASE;  Surgeon: Leanora Cover, MD;  Location: [facility];  Service: Orthopedics;  Laterality: Left;   CARPECTOMY Right 03/13/2014   Procedure: RIGHT  PROXIMAL ROW CARPECTOMY;  Surgeon: Tennis Must, MD;  Location: [facility];  Service: Orthopedics;  Laterality: Right;   CARPECTOMY Left 10/22/2015   Procedure: LEFT WRIST PROXIMAL ROW CARPECTOMY ;  Surgeon: Leanora Cover, MD;  Location: [facility];  Service: Orthopedics;  Laterality: Left;   CARPOMETACARPAL ([place]) FUSION OF THUMB Right 03/13/2014   Procedure: RIGHT FUSION OF THUMB CARPOMETACARPAL [facility]) JOINT;  Surgeon: Tennis Must, MD;  Location: [facility];  Service: Orthopedics;  Laterality: Right;   CARPOMETACARPEL SUSPENSION PLASTY Left 04/04/2020   Procedure: LEFT THUMB TRAPECIECTOMY AND SUSPENSIONPLASTY;  Surgeon: Leanora Cover, MD;  Location: [facility];  Service: Orthopedics;  Laterality: Left;   CATARACT EXTRACTION W/ INTRAOCULAR LENS  IMPLANT, BILATERAL  2009   COLONOSCOPY  08-31-2007   COLONOSCOPY     CRYOABLATION N/A 01/14/2015   Procedure: CRYO ABLATION PROSTATE;  Surgeon: Ailene Rud, MD;  Location: [facility];  Service: Urology;  Laterality: N/A;   ESOPHAGOGASTRODUODENOSCOPY  last one 09-11-2008   EXCISION MASS LEFT FACE , SUBORBITAL AREA, PLASTER RECONSTRUCTION  02-09-2011   EXCISIONAL  DEBRIDEMENT AND REPAIR RIGHT QUADRICEP TENDON  07-05-2000   FOOT SURGERY Left    I & D EXTREMITY Right 05/24/2013   Procedure: RIGHT INDEX WOUND DEBRIDEMENT AND CLOSURE;  Surgeon: Jolyn Nap, MD;  Location: [facility];  Service: Orthopedics;  Laterality: Right;   INSERTION OF SUPRAPUBIC CATHETER N/A 01/14/2015   Procedure: SUPRAPUBIC TUBE PLACEMENT;  Surgeon: Ailene Rud, MD;  Location: [facility];  Service: Urology;  Laterality: N/A;   KNEE ARTHROSCOPY Right 2008   LARYNGOSCOPY AND ESOPHAGOSCOPY  06-13-2010   MARSUPIALIZATION LEFT LARGE VALLECULAR CYST   MASS EXCISION Left 10/22/2015   Procedure: EXCISION MASS OF LEFT INDEX FINGER;  Surgeon: Leanora Cover, MD;  Location: [facility];  Service: Orthopedics;  Laterality: Left;   ORCHIECTOMY Left 02/24/2015   Procedure: SCROTAL EXPLORATION WITH LEFT ORCHIECTOMY;  Surgeon: Kathie Rhodes, MD;  Location: [facility];  Service: Urology;  Laterality: Left;   PERIPHERAL VASCULAR INTERVENTION Left 02/26/2021   Procedure: PERIPHERAL VASCULAR INTERVENTION;  Surgeon: Cherre Robins, MD;  Location: [facility];  Service: Cardiovascular;  Laterality: Left;  Superficial femoral   POSTERIOR CERVICAL FUSION/FORAMINOTOMY N/A 01/28/2017   Procedure: POSTERIOR SPINAL FUSION CERVICAL 3-4, CERVICAL 4-5, CERVICAL 5-6, CERVICAL 6-7 WITH INSTRUMENATION AND ALLOGRAFT;  Surgeon: Phylliss Bob, MD;  Location: [facility];  Service: Orthopedics;  Laterality: N/A;  POSTERIOR SPINAL FUSION CERVICAL 3-4, CERVICAL 4-5, CERVICAL 5-6, CERVICAL 6-7 WITH INSTRUMENATION AND ALLOGRAFT   SHOULDER ARTHROSCOPY/ DEBRIDEMENT LABRAL TEAR/  BICEPS TENOTOMY Left 09-24-2011   TONSILLECTOMY  as child   TRANSTHORACIC ECHOCARDIOGRAM  02-21-2010   normal LVF/  ef 55-60%/ mild to moderate MR/  moderate LAE/  mild TR   YAG LASER APPLICATION Right [id number]   Procedure: YAG LASER APPLICATION;  Surgeon: Myrtha Mantis., MD;  Location: [facility];   Service: Ophthalmology;  Laterality: Right;   YAG LASER CAPSULOTOMY, LEFT EYE  01-08-2011     A IV Location/Drains/Wounds Patient Lines/Drains/Airways Status     Active Line/Drains/Airways     Name Placement date Placement time Site Days   Peripheral IV 02/03/23 20 G Left Antecubital 02/03/23  1309  Antecubital  less than 1            Intake/Output Last 24 hours No intake or output data in the 24 hours ending 02/03/23 1603  Labs/Imaging Results for orders placed or performed during the hospital encounter of 02/03/23 (from the past 48 hour(s))  Comprehensive metabolic panel     Status: Abnormal   Collection Time: 02/03/23  1:10 PM  Result Value Ref Range   Sodium 134 (L) 135 - 145 mmol/L   Potassium 3.9 3.5 - 5.1 mmol/L   Chloride 100 98 - 111 mmol/L   CO2 20 (L) 22 - 32 mmol/L   Glucose, Bld 149 (H) 70 - 99 mg/dL    Comment: Glucose reference range applies only to samples taken after fasting for at least 8 hours.   BUN 39 (H) 8 - 23 mg/dL   Creatinine, Ser 1.79 (H) 0.61 - 1.24 mg/dL   Calcium 9.7 8.9 - 10.3 mg/dL   Total Protein 7.3 6.5 - 8.1 g/dL   Albumin 4.2 3.5 - 5.0 g/dL   AST 20 15 - 41 U/L   ALT 24 0 - 44 U/L   Alkaline Phosphatase 46 38 - 126 U/L   Total Bilirubin 0.2 (L) 0.3 - 1.2 mg/dL   GFR, Estimated 37 (L) >60 mL/min    Comment: (NOTE) Calculated using the CKD-EPI Creatinine Equation (2021)    Anion gap 14 5 - 15    Comment: Performed at [facility] [address], [place], [place] [address]  Troponin I (High Sensitivity)     Status: None   Collection Time: 02/03/23  1:10 PM  Result Value Ref Range   Troponin I (High Sensitivity) 5 <18 ng/L    Comment: (NOTE) Elevated high sensitivity troponin I (hsTnI) values and significant  changes across serial measurements may suggest ACS but many other  chronic and acute conditions are known to elevate hsTnI results.  Refer to the "Links" section for chest pain algorithms and additional   guidance. Performed at [facility], [place] [address] [place], [place] [address]   CBC with Differential     Status: Abnormal   Collection Time: 02/03/23  1:10 PM  Result Value Ref Range   WBC 8.5 4.0 - 10.5 K/uL   RBC 2.97 (L) 4.22 - 5.81 MIL/uL   Hemoglobin 7.1 (L) 13.0 - 17.0 g/dL    Comment: REPEATED TO VERIFY   HCT 23.2 (L)  39.0 - 52.0 %   MCV 78.1 (L) 80.0 - 100.0 fL   MCH 23.9 (L) 26.0 - 34.0 pg   MCHC 30.6 30.0 - 36.0 g/dL   RDW 20.3 (H) 11.5 - 15.5 %   Platelets 359 150 - 400 K/uL   nRBC 0.0 0.0 - 0.2 %   Neutrophils Relative % 86 %   Neutro Abs 7.3 1.7 - 7.7 K/uL   Lymphocytes Relative 6 %   Lymphs Abs 0.5 (L) 0.7 - 4.0 K/uL   Monocytes Relative 6 %   Monocytes Absolute 0.5 0.1 - 1.0 K/uL   Eosinophils Relative 1 %   Eosinophils Absolute 0.1 0.0 - 0.5 K/uL   Basophils Relative 1 %   Basophils Absolute 0.1 0.0 - 0.1 K/uL   Immature Granulocytes 0 %   Abs Immature Granulocytes 0.03 0.00 - 0.07 K/uL    Comment: Performed at [facility] [address] [place], [place] [address]  Lipase, blood     Status: None   Collection Time: 02/03/23  1:10 PM  Result Value Ref Range   Lipase 35 11 - 51 U/L    Comment: Performed at [facility] [address] [place], [place] [address]  Type and screen [place]     Status: None (Preliminary result)   Collection Time: 02/03/23  1:10 PM  Result Value Ref Range   ABO/RH(D) O POS    Antibody Screen NEG    Sample Expiration 02/06/2023,2359    Unit Number [id number]    Blood Component Type RED CELLS,LR    Unit division 00    Status of Unit ISSUED    Transfusion Status OK TO TRANSFUSE    Crossmatch Result      Compatible Performed at [facility], [place] [address], [place], [place] [address]   Prepare RBC (crossmatch)     Status: None   Collection Time: 02/03/23  1:44 PM  Result Value Ref Range   Order Confirmation      ORDER PROCESSED BY BLOOD BANK Performed at [facility], [place] [name], [place], [place] [address]   POC occult blood, ED     Status: Abnormal   Collection Time: 02/03/23  2:15 PM  Result Value Ref Range   Fecal Occult Bld POSITIVE (A) NEGATIVE   No results found.  Pending Labs Unresulted Labs (From admission, onward)     Start     Ordered   02/04/23 [id number]  Basic metabolic panel  Tomorrow morning,   R        02/03/23 1543   02/04/23 0500  CBC with Differential/Platelet  Tomorrow morning,   R        02/03/23 1543   02/03/23 1544  Protime-INR  Once,   R        02/03/23 1543   02/03/23 1544  APTT  Once,   R        02/03/23 1543   02/03/23 1544  TSH  Once,   R        02/03/23 1543   02/03/23 1544  Reticulocytes  Once,   R        02/03/23 1543            Vitals/Pain Today's Vitals   02/03/23 1345 02/03/23 1437 02/03/23 1452 02/03/23 1545  BP: 126/63 115/71 137/75 (!) 144/75  Pulse:  65 68   Resp: 20 16 15 $ (!) 21  Temp:  98.2 F (  36.8 C) 98.8 F (37.1 C) 98.1 F (36.7 C)  TempSrc:  Oral Oral Oral  SpO2: 98% 100%    Height:      PainSc:        Isolation Precautions No active isolations  Medications Medications  lactated ringers bolus 1,000 mL (has no administration in time range)  thiamine (VITAMIN B1) injection 100 mg (has no administration in time range)  multivitamin with minerals tablet 1 tablet (has no administration in time range)  folic acid injection 1 mg (has no administration in time range)  bisacodyl (DULCOLAX) EC tablet 10 mg (has no administration in time range)  peg 3350 powder (MOVIPREP) kit 100 g (has no administration in time range)    And  peg 3350 powder (MOVIPREP) kit 100 g (has no administration in time range)  pantoprazole (PROTONIX) injection 40 mg (40 mg Intravenous Given 02/03/23 1400)  0.9 %  sodium chloride infusion (Manually program via Guardrails IV Fluids) ( Intravenous New Bag/Given 02/03/23 1402)    Mobility Walks at baseline. Have not assessed ambulation in ED     Focused  Assessments    R Recommendations: See Admitting Provider Note  Report given to:   Additional Notes:  received 1st unit blood transfusion

## 2023-02-03 NOTE — H&P (Cosign Needed Addendum)
Date: 02/03/2023               Patient Name:  Russell [name] MRN: [id number]  DOB: Nov 10, 1941 Age / Sex: 82 y.o., male   PCP: Marcie Mowers, FNP         Medical Service: Internal Medicine Teaching Service         Attending Physician: Dr. Aldine Contes, MD    First Contact: Gaylyn Rong, MD      Pager: Dorothea Ogle [phone]      Second Contact: Virl Axe, MD      Pager: [phone]           After Hours (After 5p/  First Contact Pager: [phone]  weekends / holidays): Second Contact Pager: [phone]   SUBJECTIVE   Chief Complaint: syncope  History of Present Illness:  Russell [name] is an 82 year old male with PMH of depression,  HTN, CHF, cerebral atrophy, COPD, seizure disorder, subdural hematomas with frequent falls, glaucoma, previously treated T2DM, CKD stage 3b, PAD, prostate cancer, remote alcohol use disorder (abstinent x 19 years), chronic IDA, GERD and diverticulosis brought in by EMS after episode of syncope at a gas station.   He was recently in the ED on 2/2 and found to have symptomatic anemia likely due to GI bleed.  He was given 1 unit of RBCs and felt better, was recommended admission but declined and was discharged at that time.  He was arranged outpatient GI follow-up.  Since that point, he has had dizziness, dyspnea, generalized weakness and lightheadedness, poor appetite, dark black stools.  He was at the gas station earlier today to pick up a pack of cigarettes and started feeling weak and dizzy.  He tried to get into his truck but then fell and lost consciousness.  The next thing he remembers is waking up as someone was taking him into the ambulance.  He does not remember any preceding palpitations, chest pain, dyspnea, headache or vision changes.  When he was initially assessed by EMS, he was noted to have blood pressure 80/50, pulse 40, CBG 159, GCS 14/alert and oriented x 4.  EMS noted sinus bradycardia with PVCs.  He was noted to be orthostatic.   Uncertain if fall was witnessed or if he had head strike, but he does not seem to think so.  No seizure-like activity or bowel or bladder incontinence noted either.  EMS initiated fluid resuscitation and then he has received 2 L LR in the ED.  Initial hemoglobin was 7.1, FOBT positive.  He additionally received 1 unit PRBCs and GI was consulted.  He was started on IV Protonix twice daily.  MTS was consulted for admission.  He said he had 1 episode of emesis after he got to the hospital earlier today but that he did not have any blood in his vomit.  RN notes that he was coughing up phlegm after he arrived but had not actually vomited.  He has had multiple episodes of syncope in the past but not recently.  He has had colonoscopies but states that they were long time ago.  Last EGD results I see are from 2009.    Meds:  No outpatient medications have been marked as taking for the 02/03/23 encounter [facility] Encounter).  Official med rec not done yet.  We reviewed the following prescribed medications with him: - Alphagan, Simbrinza, Lumigan for glaucoma (taking) - Plavix for PAD (not taking) - Ferrous sulfate (stopped taking recently because of constipation) - Gabapentin 300 daily (  taking) - Losartan 100 mg daily (taking) - Meclizine 25 mg daily as needed (taking) - Oxybutynin, Flomax, Myrbetriq history of prostate cancer and urinary issues (taking) - Protonix 40 mg daily for GERD (taking) - Pravastatin 80 mg daily for HLD (statin)   Past Medical History  Past Surgical History:  Procedure Laterality Date   ABDOMINAL AORTOGRAM W/LOWER EXTREMITY Left 12/18/2020   Procedure: ABDOMINAL AORTOGRAM W/LOWER EXTREMITY;  Surgeon: Cherre Robins, MD;  Location: [place] [facility];  Service: Cardiovascular;  Laterality: Left;   ABDOMINAL AORTOGRAM W/LOWER EXTREMITY N/A 02/26/2021   Procedure: ABDOMINAL AORTOGRAM W/LOWER EXTREMITY;  Surgeon: Cherre Robins, MD;  Location: [place] [facility];  Service:  Cardiovascular;  Laterality: N/A;   ANTERIOR CERVICAL DECOMP/DISCECTOMY FUSION  10-16-2009   C4 -- C6   ANTERIOR CERVICAL DECOMP/DISCECTOMY FUSION N/A 01/27/2017   Procedure: ANTERIOR CERVICAL DECOMPRESSION FUSION CERVICAL 3-4 WITH INSTRUMENTATION AND ALLOGRAFT;  Surgeon: Phylliss Bob, MD;  Location: [place];  Service: Orthopedics;  Laterality: N/A;  ANTERIOR CERVICAL DECOMPRESSION FUSION CERVICAL 3-4 WITH INSTRUMENTATION AND ALLOGRAFT   CAPSULOTOMY Right 01/26/2013   Procedure: MINOR CAPSULOTOMY;  Surgeon: Myrtha Mantis., MD;  Location: [place];  Service: Ophthalmology;  Laterality: Right;   CARPAL TUNNEL RELEASE Left 01/02/2019   Procedure: LEFT CARPAL TUNNEL RELEASE;  Surgeon: Leanora Cover, MD;  Location: [place];  Service: Orthopedics;  Laterality: Left;   CARPECTOMY Right 03/13/2014   Procedure: RIGHT  PROXIMAL ROW CARPECTOMY;  Surgeon: Tennis Must, MD;  Location: [place];  Service: Orthopedics;  Laterality: Right;   CARPECTOMY Left 10/22/2015   Procedure: LEFT WRIST PROXIMAL ROW CARPECTOMY ;  Surgeon: Leanora Cover, MD;  Location: [place];  Service: Orthopedics;  Laterality: Left;   CARPOMETACARPAL ([place]) FUSION OF THUMB Right 03/13/2014   Procedure: RIGHT FUSION OF THUMB CARPOMETACARPAL [facility]) JOINT;  Surgeon: Tennis Must, MD;  Location: [place];  Service: Orthopedics;  Laterality: Right;   CARPOMETACARPEL SUSPENSION PLASTY Left 04/04/2020   Procedure: LEFT THUMB TRAPECIECTOMY AND SUSPENSIONPLASTY;  Surgeon: Leanora Cover, MD;  Location: [place];  Service: Orthopedics;  Laterality: Left;   CATARACT EXTRACTION W/ INTRAOCULAR LENS  IMPLANT, BILATERAL  2009   COLONOSCOPY  08-31-2007   COLONOSCOPY     CRYOABLATION N/A 01/14/2015   Procedure: CRYO ABLATION PROSTATE;  Surgeon: Ailene Rud, MD;  Location: [facility];  Service: Urology;  Laterality: N/A;   ESOPHAGOGASTRODUODENOSCOPY   last one 09-11-2008   EXCISION MASS LEFT FACE , SUBORBITAL AREA, PLASTER RECONSTRUCTION  02-09-2011   EXCISIONAL DEBRIDEMENT AND REPAIR RIGHT QUADRICEP TENDON  07-05-2000   FOOT SURGERY Left    I & D EXTREMITY Right 05/24/2013   Procedure: RIGHT INDEX WOUND DEBRIDEMENT AND CLOSURE;  Surgeon: Jolyn Nap, MD;  Location: [place];  Service: Orthopedics;  Laterality: Right;   INSERTION OF SUPRAPUBIC CATHETER N/A 01/14/2015   Procedure: SUPRAPUBIC TUBE PLACEMENT;  Surgeon: Ailene Rud, MD;  Location: [facility];  Service: Urology;  Laterality: N/A;   KNEE ARTHROSCOPY Right 2008   LARYNGOSCOPY AND ESOPHAGOSCOPY  06-13-2010   MARSUPIALIZATION LEFT LARGE VALLECULAR CYST   MASS EXCISION Left 10/22/2015   Procedure: EXCISION MASS OF LEFT INDEX FINGER;  Surgeon: Leanora Cover, MD;  Location: [place];  Service: Orthopedics;  Laterality: Left;   ORCHIECTOMY Left 02/24/2015   Procedure: SCROTAL EXPLORATION WITH LEFT ORCHIECTOMY;  Surgeon: Kathie Rhodes, MD;  Location:  [place] [facility];  Service: Urology;  Laterality: Left;   PERIPHERAL VASCULAR INTERVENTION Left 02/26/2021   Procedure: PERIPHERAL VASCULAR INTERVENTION;  Surgeon: Cherre Robins, MD;  Location: [place] [facility];  Service: Cardiovascular;  Laterality: Left;  Superficial femoral   POSTERIOR CERVICAL FUSION/FORAMINOTOMY N/A 01/28/2017   Procedure: POSTERIOR SPINAL FUSION CERVICAL 3-4, CERVICAL 4-5, CERVICAL 5-6, CERVICAL 6-7 WITH INSTRUMENATION AND ALLOGRAFT;  Surgeon: Phylliss Bob, MD;  Location: [place];  Service: Orthopedics;  Laterality: N/A;  POSTERIOR SPINAL FUSION CERVICAL 3-4, CERVICAL 4-5, CERVICAL 5-6, CERVICAL 6-7 WITH INSTRUMENATION AND ALLOGRAFT   SHOULDER ARTHROSCOPY/ DEBRIDEMENT LABRAL TEAR/  BICEPS TENOTOMY Left 09-24-2011   TONSILLECTOMY  as child   TRANSTHORACIC ECHOCARDIOGRAM  02-21-2010   normal LVF/  ef 55-60%/ mild to moderate MR/  moderate LAE/  mild TR   YAG LASER APPLICATION  Right [id number]   Procedure: YAG LASER APPLICATION;  Surgeon: Myrtha Mantis., MD;  Location: [facility];  Service: Ophthalmology;  Laterality: Right;   YAG LASER CAPSULOTOMY, LEFT EYE  01-08-2011    Social:  Lives With: Wife and daughter Occupation: Support: Family support as above Level of Function: Ambulatory with walker most of the time, but occasionally uses wheelchair as needed.  Manages his own IADLs. PCP: [facility] Substances: Extensive smoking history 1 to 2 packs/day for more than 40 years.  Prior alcohol history, last drink was about 20 years ago.  Declines any other illicit substance use.  Family History:  Family History  Problem Relation Age of Onset   Unexplained death Mother        died at a young age, no known cause   Heart attack Father        age unknown   Diabetes Other    Cancer Other      Allergies: Allergies as of 02/03/2023 - Review Complete 02/03/2023  Allergen Reaction Noted   Tramadol  04/10/2020    Review of Systems: A complete ROS was negative except as per HPI.   OBJECTIVE:   Physical Exam: Blood pressure 137/75, pulse 68, temperature 98.8 F (37.1 C), temperature source Oral, resp. rate 15, height 6' 2"$  (1.88 m), SpO2 100 %.  Constitutional: Chronically ill-appearing, lying in bed under multiple blankets, NAD Cardiovascular: Bradycardic, regular rate, no murmurs, rubs or gallops Pulmonary/Chest: normal work of breathing on room air, lungs clear to auscultation bilaterally.   Abdominal: Soft, mildly distended, diffuse TTP, no rebound or guarding Skin:Decreased skin turgor Extremities: warm, well perfused, extremity pulses 2+, no BLE pitting edema  Labs:    Latest Ref Rng & Units 02/03/2023    1:10 PM 01/22/2023    4:52 PM 01/22/2023    1:34 PM 07/22/2022    7:00 PM 05/04/2022    4:28 PM 02/16/2022    9:33 AM 02/15/2022    6:35 PM  CBC EXTENDED  WBC 4.0 - 10.5 K/uL 8.5   7.4  8.3  6.6  10.2    RBC 4.22 - 5.81 MIL/uL  2.97  2.66  2.84  3.20  3.13  3.99    Hemoglobin 13.0 - 17.0 g/dL 7.1   7.2  7.8  7.9  8.9  9.0   HCT 39.0 - 52.0 % 23.2   23.2  26.1  28.5  29.5  28.8   Platelets 150 - 400 K/uL 359   474  408  152  414    NEUT# 1.7 - 7.7 K/uL 7.3   5.6  4.8  4.2  Lymph# 0.7 - 4.0 K/uL 0.5   1.1  2.5  1.7         Latest Ref Rng & Units 02/03/2023    1:10 PM 01/22/2023    1:34 PM 07/22/2022    7:00 PM 05/04/2022    4:28 PM 02/15/2022    3:41 AM 02/14/2022    2:48 AM 02/13/2022    2:25 PM  CMP  Glucose 70 - 99 mg/dL 149  97  99  91  106  98  123   BUN 8 - 23 mg/dL 39  46  26  37  26  37  51   Creatinine 0.61 - 1.24 mg/dL 1.79  1.69  1.89  1.75  1.50  1.63  2.25   Sodium 135 - 145 mmol/L 134  132  139  137  138  134  134   Potassium 3.5 - 5.1 mmol/L 3.9  4.0  4.4  4.4  4.1  3.9  3.9   Chloride 98 - 111 mmol/L 100  100  109  105  106  102  99   CO2 22 - 32 mmol/L 20  19  19  24  21  24  23   $ Calcium 8.9 - 10.3 mg/dL 9.7  9.4  9.5  9.0  8.8  8.9  9.5   Total Protein 6.5 - 8.1 g/dL 7.3  7.0  7.2  6.7    7.1   Total Bilirubin 0.3 - 1.2 mg/dL 0.2  0.4  0.6  0.6    0.4   Alkaline Phos 38 - 126 U/L 46  40  36  40    47   AST 15 - 41 U/L 20  18  12  27    11   $ ALT 0 - 44 U/L 24  21  14  25    16   $ Troponins 5, pending Lipase 35 FOBT positive   Imaging: Normal cardiac silhouette, good inspiratory volume, bilateral hazy lower lung opacities   EKG: personally reviewed my interpretation is sinus bradycardia with PVCs.  Inverted P waves in 2-3, aVF, V1 and V2.  Inverted T waves in aVR.  Not new compared to prior EKG.  ASSESSMENT & PLAN:   Assessment & Plan by Problem: Principal Problem:   Symptomatic anemia   DIONTAY VOLLBRECHT is a 82 y.o. 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  He describes subacute weakness, lightheadedness, some dizziness before the episode.  Most likely explanation is symptomatic anemia and likely orthostasis based  initially on initial hypotension per ED run sheet.  Alternative etiologies include cardiogenic as he presents with sinus bradycardia with PVCs. Lower suspicion of seizure with unclear seizure disorder not on any AEDs but without any seizure-like activity reported on scene or postictal state.  Of note, he does have prior history of subdural hematoma and cerebral atrophy and there is no clear witness history about his fall.  He does not have any residual head trauma, but given loss of consciousness, would  lean towards ruling out intracranial bleed with CT head.  Fluid initially resuscitated by EMS in ED, symptomatic anemia management below.  His bradycardia is improving, but if it repeats and he has further episodes of syncope not explained by anemia or orthostasis, would consider further cardiology workup, would maintain on cardiac monitor at this point. -Continue fluid resuscitation, symptomatic anemia management below.  Follow-up orthostatics - Follow-up noncontrast CT head - Continue cardiac monitoring.  Consider echo after initial GI management - LR 100 cc an hour - Follow-up TSH  #Symptomatic anemia concerning for GI bleed #History of IDA #GERD #Diverticulosis Patient initially presented on 2/2 with symptomatic anemia with concern for GI bleed, was advised to admit at that point but declined and was set up with outpatient GI follow-up.  He presented again today with syncope likely due to symptomatic anemia and endorses  melena.  Initial hemoglobin was 7.1 with FOBT positive in the ED.  He was given initial fluid resuscitation for hypotension and then given 1 unit PRBCs.  Iron panel on 2/2 shows ferritin 6, sat ratio 4.  He will need IV iron repletion following GI procedures.  GI was consulted, started IV Protonix 40 twice daily, placed on clears, and n.p.o. at midnight for endoscopy and colonoscopy tomorrow.  Appreciate further recs. - N.p.o. at midnight for EGD/colonoscopy tomorrow.  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  He is on losartan 100 mg daily at home.  Holding at this point in the context of AKI and hypotension with GI bleed.  He is normotensive and hemodynamically stable.  No concerns for volume overload on exam.  #CKD 3 Creatinine initially was 1.79, BUN 39.  Uncertain exactly what baseline is but was approximately 1.5 earlier this year.  Unsure if he qualifies for AKI criteria but he is dehydrated on exam.  Will resuscitate volume and reassess kidney status. - IVF's as above  #History of prostate cancer with residual urinary symptoms Uncertain if patient is actually taking mirabegron, Flomax, oxybutynin.  Conflicting reports from patient and his spouse, dispense records.  If he is having signs of urinary obstruction, urgency can restart these meds.  #PAD #HLD Supposed to be on Plavix, patient not taking.  Will continue holding for now in the context of GI bleed. - Hold Plavix, continue home pravastatin 80  #Glaucoma Will continue eyedrops  #History of T2DM Last reported hemoglobin A1c was in 2016, 5.9.  He is not on any diabetes medications at home at this point.  Initial CBG was unremarkable.  Do not think we need to monitor at this time  Diet:  Clears,  n.p.o. at midnight VTE:  None in the context of GI bleed IVF: LR,100cc/hr Code: Full  Prior to Admission Living Arrangement: Home, living with wife and daughter Anticipated Discharge Location:  Pending ongoing workup and management Barriers to Discharge: GI workup  Dispo: Admit patient to Inpatient with expected length of stay greater than 2 midnights.  Signed: Linus Galas, MD Internal Medicine Resident PGY-1  02/03/2023, 4:32 PM

## 2023-02-03 NOTE — Hospital Course (Addendum)
12/16: He is feeling better. No stomach pain, nausea, or vomiting. Denies any burning with urination but having increased urinary frequency. Thinks he has had a couple of dark colored stools since yesterday.

## 2023-02-03 NOTE — Consult Note (Addendum)
Referring Provider: Dr. Teressa Lower Primary Care Physician:  Marcie Mowers, [place] Primary Gastroenterologist:  Dr. Melina Copa [facility] 2009  Reason for Consultation:  Symptomatic anemia, + FOBT  HPI: Russell [name] is an 82 year old male with a past medical history of depression, aortic atherosclerosis, CHF, cerebral atrophy, COPD, seizure disorder, subdural hematomas with frequent falls, glaucoma, diabetes mellitus type 2, CKD stage 3b, PAD (no longer on Plavix), prostate cancer, remote alcohol use disorder (abstinent x 19 years), chronic IDA, GERD and diverticulosis.  He is a challenging historian therefore a significant portion of his history was obtained through his EPIC records.  No family at the bedside.  He lives with his wife.  He presented to the ED 01/22/2023 due to having generalized weakness.  Laboratory studies showed anemia with a Hg level of 7.2 (Hg 7.8 on 07/22/2022). Hematocrit 23.2.  Platelet 474.  BUN 46 . Cr 1.69.  Iron 20. Saturation ratios 4%.  TIBC 497.  Ferritin 6.  Vitamin B12 1304. FOBT positive. No melena or rectal bleeding. He was transfused 1 unit of PRBCs and hospital admission was recommended for further GI evaluation but he declined and he was discharged home.  He was scheduled for new patient office visit with me in our outpatient GI clinic on 02/23/2023.  He felt dizzy and lightheaded followed by a syncopal episode today while at the convenience store. He presented to [facility] ED via EMS for further evaluation. He was initially confused per EMS but upon arrival to the ED he was alert and oriented. BP 111/50. Heart rate in the 40s.  Labs in the ED showed a hemoglobin level of 7.1.  Hematocrit 23.2.  MCV 78.1.  Platelet 359.  Sodium 134.  Potassium 3.9.  BUN 39.  Creatinine 1.79.  Total bili 0.2.  Alk phos 46.  AST 20.  ALT 24.  FOBT positive.  A GI consult was requested for further evaluation regarding symptomatic iron deficiency anemia with suspected  chronic GI blood loss.  He denies having any dysphagia or heartburn. He sometimes has stomach pain which is vague and comes and goes for the past few months. He endorses passing black solid or loose stools once or twice daily for the past 6 months. He denies taking any oral iron or Pepto-Bismol.  He takes ASA 81 mg once daily. He is no longer on Plavix. Previously prescribed Pantoprazole 40 mg daily but he does not think he is taking it. He vomited of juice while he was in the ED, no hematemesis. No BM or melena since arriving to the ED. He underwent an EGD by Dr. Deatra Ina 08/2008 which showed a normal esophagus, retained food in the stomach and erosions in the gastric body and antrum. Gastric biopsies showed chronic gastritis without evidence of H. pylori. He underwent a colonoscopy by [facility] 08/2007 which showed diverticulosis to the sigmoid colon.  He denies having any further colonoscopies since then.  No known family history of esophageal, gastric or colon cancer.  He believes he is lost about 15 pounds over the past few months.  He has intermittent sweats during the day and nighttime.  In review of his EPIC records, he was admitted to the hospital 2/24 -02/16/2022 with syncope with associated fall and anemia. Syncope likely due to orthostasis, decreased p.o. intake and anemia. He was on Plavix at that time.  Head CT was negative at that time.  Hemoglobin level was 8.1. He was transfused units of PRBCs and received  IV Venofer. He also had generalized abdominal pain with constipation, CTAP without acute intraabdominal/pelvic pathology. His hemoglobin level was 8.9 on day of discharge and he was instructed to hold Plavix. He was prescribed ferrous sulfate p.o. every other day day.  A GI consult was not requested during this hospital admission.  ECHO 02/02/2022: LVEF 45 to 50%.  Normal RV function.  Mild MR.  PAST GI PROCEDURES:  EGD by Dr. Deatra Ina 09/11/2008: Normal esophagus, retained food found in the  stomach, erosions present to the gastric body and antrum Normal gastric empty study  Colonoscopy by [facility] 08/31/2007:  Diverticulosis in the sigmoid colon Repeat colonoscopy in 5 to 10 years   Past Medical History:  Diagnosis Date   AKI (acute kidney injury) ([place]) 06/23/2017   ARM PAIN 06/03/2010   Qualifier: Diagnosis of  By: Allean Found, LPN, [place], LUMBAR   Atherosclerosis of aorta ([place]) 02/14/2022   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   BPH with obstruction/lower urinary tract symptoms    BPH with urinary obstruction 01/15/2016   Cerebral atrophy ([place]) 02/14/2022   Cervical radiculopathy 01/27/2017   Chronic asymptomatic bacteriuria with pyuria 02/14/2022   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   COPD (chronic obstructive pulmonary disease) ([place])    Coronary atherosclerosis    denied knowledge of this 10/27/16   Depression    Diverticulosis of colon    DYSPHAGIA UNSPECIFIED 05/16/2009   Qualifier: Diagnosis of  By: Amil Amen MD, [place]     Dyspnea    EARLY SATIETY 11/04/2007   Qualifier: Diagnosis of  By: Amil Amen MD, Benjamine Mola     ED (erectile dysfunction)    Full dentures    GERD (gastroesophageal reflux disease)    not current   Glaucoma    History of gastritis    History of scabies    2008   History of seizure    2013-- x2   idiopathic --  none since   History of syncope    03-22-2014  DX  VAGAL RESPONSE   Hyperlipidemia, mixed    Hypertension    Mitral regurgitation    Peripheral arterial disease ([place])    one vessel runoff bilaterally via peroneal arteries   Prostate cancer ([place])    Seizures ([place]) 03/2014   ? or syncope- patient refused to be admitted for further studies- "felt better"   Type 2 diabetes, diet controlled ([place])    patient and his wife said no- have not been told 01/21/17   Wears glasses     Past Surgical History:  Procedure Laterality Date   ABDOMINAL AORTOGRAM W/LOWER EXTREMITY Left 12/18/2020    Procedure: ABDOMINAL AORTOGRAM W/LOWER EXTREMITY;  Surgeon: Cherre Robins, MD;  Location: [place] [facility];  Service: Cardiovascular;  Laterality: Left;   ABDOMINAL AORTOGRAM W/LOWER EXTREMITY N/A 02/26/2021   Procedure: ABDOMINAL AORTOGRAM W/LOWER EXTREMITY;  Surgeon: Cherre Robins, MD;  Location: [place] [facility];  Service: Cardiovascular;  Laterality: N/A;   ANTERIOR CERVICAL DECOMP/DISCECTOMY FUSION  10-16-2009   C4 -- C6   ANTERIOR CERVICAL DECOMP/DISCECTOMY FUSION N/A 01/27/2017   Procedure: ANTERIOR CERVICAL DECOMPRESSION FUSION CERVICAL 3-4 WITH INSTRUMENTATION AND ALLOGRAFT;  Surgeon: Phylliss Bob, MD;  Location: [place];  Service: Orthopedics;  Laterality: N/A;  ANTERIOR CERVICAL DECOMPRESSION FUSION CERVICAL 3-4 WITH INSTRUMENTATION AND ALLOGRAFT   CAPSULOTOMY Right 01/26/2013   Procedure: MINOR CAPSULOTOMY;  Surgeon: Myrtha Mantis., MD;  Location: [place];  Service: Ophthalmology;  Laterality:  Right;   CARPAL TUNNEL RELEASE Left 01/02/2019   Procedure: LEFT CARPAL TUNNEL RELEASE;  Surgeon: Leanora Cover, MD;  Location: [place];  Service: Orthopedics;  Laterality: Left;   CARPECTOMY Right 03/13/2014   Procedure: RIGHT  PROXIMAL ROW CARPECTOMY;  Surgeon: Tennis Must, MD;  Location: [place];  Service: Orthopedics;  Laterality: Right;   CARPECTOMY Left 10/22/2015   Procedure: LEFT WRIST PROXIMAL ROW CARPECTOMY ;  Surgeon: Leanora Cover, MD;  Location: [place];  Service: Orthopedics;  Laterality: Left;   CARPOMETACARPAL ([place]) FUSION OF THUMB Right 03/13/2014   Procedure: RIGHT FUSION OF THUMB CARPOMETACARPAL [facility]) JOINT;  Surgeon: Tennis Must, MD;  Location: [place];  Service: Orthopedics;  Laterality: Right;   CARPOMETACARPEL SUSPENSION PLASTY Left 04/04/2020   Procedure: LEFT THUMB TRAPECIECTOMY AND SUSPENSIONPLASTY;  Surgeon: Leanora Cover, MD;  Location: [place];  Service: Orthopedics;   Laterality: Left;   CATARACT EXTRACTION W/ INTRAOCULAR LENS  IMPLANT, BILATERAL  2009   COLONOSCOPY  08-31-2007   COLONOSCOPY     CRYOABLATION N/A 01/14/2015   Procedure: CRYO ABLATION PROSTATE;  Surgeon: Ailene Rud, MD;  Location: [facility];  Service: Urology;  Laterality: N/A;   ESOPHAGOGASTRODUODENOSCOPY  last one 09-11-2008   EXCISION MASS LEFT FACE , SUBORBITAL AREA, PLASTER RECONSTRUCTION  02-09-2011   EXCISIONAL DEBRIDEMENT AND REPAIR RIGHT QUADRICEP TENDON  07-05-2000   FOOT SURGERY Left    I & D EXTREMITY Right 05/24/2013   Procedure: RIGHT INDEX WOUND DEBRIDEMENT AND CLOSURE;  Surgeon: Jolyn Nap, MD;  Location: [place];  Service: Orthopedics;  Laterality: Right;   INSERTION OF SUPRAPUBIC CATHETER N/A 01/14/2015   Procedure: SUPRAPUBIC TUBE PLACEMENT;  Surgeon: Ailene Rud, MD;  Location: [facility];  Service: Urology;  Laterality: N/A;   KNEE ARTHROSCOPY Right 2008   LARYNGOSCOPY AND ESOPHAGOSCOPY  06-13-2010   MARSUPIALIZATION LEFT LARGE VALLECULAR CYST   MASS EXCISION Left 10/22/2015   Procedure: EXCISION MASS OF LEFT INDEX FINGER;  Surgeon: Leanora Cover, MD;  Location: [place];  Service: Orthopedics;  Laterality: Left;   ORCHIECTOMY Left 02/24/2015   Procedure: SCROTAL EXPLORATION WITH LEFT ORCHIECTOMY;  Surgeon: Kathie Rhodes, MD;  Location: [place];  Service: Urology;  Laterality: Left;   PERIPHERAL VASCULAR INTERVENTION Left 02/26/2021   Procedure: PERIPHERAL VASCULAR INTERVENTION;  Surgeon: Cherre Robins, MD;  Location: [place] [facility];  Service: Cardiovascular;  Laterality: Left;  Superficial femoral   POSTERIOR CERVICAL FUSION/FORAMINOTOMY N/A 01/28/2017   Procedure: POSTERIOR SPINAL FUSION CERVICAL 3-4, CERVICAL 4-5, CERVICAL 5-6, CERVICAL 6-7 WITH INSTRUMENATION AND ALLOGRAFT;  Surgeon: Phylliss Bob, MD;  Location: [place];  Service: Orthopedics;  Laterality: N/A;  POSTERIOR SPINAL FUSION  CERVICAL 3-4, CERVICAL 4-5, CERVICAL 5-6, CERVICAL 6-7 WITH INSTRUMENATION AND ALLOGRAFT   SHOULDER ARTHROSCOPY/ DEBRIDEMENT LABRAL TEAR/  BICEPS TENOTOMY Left 09-24-2011   TONSILLECTOMY  as child   TRANSTHORACIC ECHOCARDIOGRAM  02-21-2010   normal LVF/  ef 55-60%/ mild to moderate MR/  moderate LAE/  mild TR   YAG LASER APPLICATION Right [id number]   Procedure: YAG LASER APPLICATION;  Surgeon: Myrtha Mantis., MD;  Location: [facility] OR;  Service: Ophthalmology;  Laterality: Right;   YAG LASER CAPSULOTOMY, LEFT EYE  01-08-2011    Prior to Admission medications   Medication Sig Start Date End Date Taking? Authorizing Provider  acetaminophen (TYLENOL) 325 MG tablet Take 2 tablets (650 mg total)  by mouth every 4 (four) hours as needed for mild pain (or temp > 37.5 C (99.5 F)). 07/10/21   Samuella Cota, MD  [facility] P 0.1 % SOLN Place 1 drop into the right eye 3 (three) times daily. 03/23/22   [provider]  bimatoprost (LUMIGAN) 0.01 % SOLN Place 1 drop into both eyes at bedtime. 04/10/13   Dhungel, Nishant, MD  brimonidine (ALPHAGAN) 0.2 % ophthalmic solution Place 1 drop into both eyes 3 (three) times daily. 03/14/19   [provider]  Brinzolamide-Brimonidine (SIMBRINZA) 1-0.2 % SUSP Place 1 drop into both eyes 3 (three) times daily.    [provider]  clopidogrel (PLAVIX) 75 MG tablet TAKE 1 TABLET BY MOUTH EVERY DAY 06/01/22   Waynetta Sandy, MD  ferrous sulfate 325 (65 FE) MG tablet Take 1 tablet (325 mg total) by mouth every other day for 100 doses. 02/16/22 09/04/22  Precious Gilding, DO  gabapentin (NEURONTIN) 300 MG capsule Take 300 mg by mouth daily. 02/25/21   [provider]  losartan (COZAAR) 100 MG tablet Take 100 mg by mouth daily. 04/13/22   [provider]  meclizine (ANTIVERT) 25 MG tablet Take 25 mg by mouth daily as needed. 11/24/22   [provider]  Multiple Vitamin (MULTIVITAMIN WITH MINERALS) TABS tablet Take 1  tablet by mouth daily. One a day    [provider]  MYRBETRIQ 25 MG TB24 tablet Take 25 mg by mouth daily. 02/12/20   [provider]  oxybutynin (DITROPAN-XL) 10 MG 24 hr tablet Take 10 mg by mouth daily. 11/22/22   [provider]  oxyCODONE-acetaminophen (PERCOCET) 10-325 MG tablet Take 1 tablet by mouth every 4 (four) hours as needed for pain. 07/28/22   Hyatt, Max T, DPM  oxyCODONE-acetaminophen (PERCOCET) 10-325 MG tablet Take 1 tablet by mouth every 8 (eight) hours as needed for up to 7 days for pain. 02/02/23 02/09/23  Hyatt, Max T, DPM  pantoprazole (PROTONIX) 40 MG tablet Take 1 tablet (40 mg total) by mouth daily. 02/04/22 03/06/22  Gerrit Heck, MD  pravastatin (PRAVACHOL) 80 MG tablet Take 80 mg by mouth daily.  10/19/16   [provider]  tamsulosin (FLOMAX) 0.4 MG CAPS capsule Take by mouth daily. 10/23/22   [provider]    Current Facility-Administered Medications  Medication Dose Route Frequency Provider Last Rate Last Admin   lactated ringers bolus 1,000 mL  1,000 mL Intravenous Once Kommor, [place], MD       Current Outpatient Medications  Medication Sig Dispense Refill   acetaminophen (TYLENOL) 325 MG tablet Take 2 tablets (650 mg total) by mouth every 4 (four) hours as needed for mild pain (or temp > 37.5 C (99.5 F)).     ALPHAGAN P 0.1 % SOLN Place 1 drop into the right eye 3 (three) times daily.     bimatoprost (LUMIGAN) 0.01 % SOLN Place 1 drop into both eyes at bedtime. 30 mL 3   brimonidine (ALPHAGAN) 0.2 % ophthalmic solution Place 1 drop into both eyes 3 (three) times daily.     Brinzolamide-Brimonidine (SIMBRINZA) 1-0.2 % SUSP Place 1 drop into both eyes 3 (three) times daily.     clopidogrel (PLAVIX) 75 MG tablet TAKE 1 TABLET BY MOUTH EVERY DAY 30 tablet 3   ferrous sulfate 325 (65 FE) MG tablet Take 1 tablet (325 mg total) by mouth every other day for 100 doses. 100 tablet 0   gabapentin (NEURONTIN) 300 MG capsule Take  300 mg by mouth daily.     losartan (COZAAR) 100 MG tablet Take 100 mg by mouth daily.     meclizine (ANTIVERT) 25 MG tablet Take 25 mg by mouth daily as needed.     Multiple Vitamin (MULTIVITAMIN WITH MINERALS) TABS tablet Take 1 tablet by mouth daily. One a day     MYRBETRIQ 25 MG TB24 tablet Take 25 mg by mouth daily.     oxybutynin (DITROPAN-XL) 10 MG 24 hr tablet Take 10 mg by mouth daily.     oxyCODONE-acetaminophen (PERCOCET) 10-325 MG tablet Take 1 tablet by mouth every 4 (four) hours as needed for pain. 21 tablet 0   oxyCODONE-acetaminophen (PERCOCET) 10-325 MG tablet Take 1 tablet by mouth every 8 (eight) hours as needed for up to 7 days for pain. 21 tablet 0   pantoprazole (PROTONIX) 40 MG tablet Take 1 tablet (40 mg total) by mouth daily. 30 tablet 0   pravastatin (PRAVACHOL) 80 MG tablet Take 80 mg by mouth daily.   2   tamsulosin (FLOMAX) 0.4 MG CAPS capsule Take by mouth daily.      Allergies as of 02/03/2023 - Review Complete 02/03/2023  Allergen Reaction Noted   Tramadol  04/10/2020    Family History  Problem Relation Age of Onset   Unexplained death Mother        died at a young age, no known cause   Heart attack Father        age unknown   Diabetes Other    Cancer Other     Social History   Socioeconomic History   Marital status: Married    Spouse name: Not on file   Number of children: 3   Years of education: 12   Highest education level: High school graduate  Occupational History   Occupation: Retired - poured concrete  Tobacco Use   Smoking status: Every Day    Packs/day: 1.00    Years: 59.00    Total pack years: 59.00    Types: Cigarettes   Smokeless tobacco: Never   Tobacco comments:    Started Chantix 10/26/16  Vaping Use   Vaping Use: Never used  Substance and Sexual Activity   Alcohol use: No    Comment: none since approx 2014- heavy before   Drug use: No   Sexual activity: Not on file  Other Topics Concern   Not on file  Social  History Narrative   Lives with wife and 2 of his children in a one story home.  Has 3 children.  Retired Counsellor.  Education: high school.   Right Handed   Social Determinants of Health   Financial Resource Strain: Not on file  Food Insecurity: Not on file  Transportation Needs: Not on file  Physical Activity: Not on file  Stress: Not on file  Social Connections: Not on file  Intimate Partner Violence: Not on file    Review of Systems: Gen: See HPI. CV: Denies chest pain, palpitations or edema. Resp: Denies cough, shortness of breath of hemoptysis.  GI: See HPI.  GU : Denies urinary burning, blood in urine, increased urinary frequency or incontinence. MS: Denies joint pain, muscles aches or weakness. Derm: Denies rash, itchiness, skin lesions or unhealing ulcers. Psych: + Memory loss.  Heme: Denies easy bruising, bleeding. Neuro:  Denies headaches, dizziness or paresthesias. Endo:  + DM II.  Physical Exam: Vital signs in last 24 hours: Temp:  [98.1 F (36.7 C)-98.2 F (36.8 C)] 98.2  F (36.8 C) (02/14 1437) Pulse Rate:  [43-65] 65 (02/14 1437) Resp:  [16-20] 16 (02/14 1437) BP: (115-133)/(63-71) 115/71 (02/14 1437) SpO2:  [98 %-100 %] 100 % (02/14 1437)   General: Alert 82 year old male fatigued appearing in no acute distress. Head:  Normocephalic and atraumatic. Eyes:  No scleral icterus. Conjunctiva pink. Ears:  Normal auditory acuity. Nose:  No deformity, discharge or lesions. Mouth: Absent dentition.  No ulcers or lesions.  Neck:  Supple. No lymphadenopathy or thyromegaly.  Lungs: Breath sounds clear throughout. No wheezes, rhonchi or crackles.  Heart: Regular rate and rhythm, no murmurs. Abdomen:, Nontender.  Positive bowel sounds all 4 quadrants. Rectal: Deferred.  FOBT resulted positive. Musculoskeletal:  Symmetrical without gross deformities.  Pulses:  Normal pulses noted. Extremities:  Without clubbing or edema. Neurologic:  Alert and  oriented x  3.  Speech is clear, moves all extremities weakly. Skin:  Intact without significant lesions or rashes. Psych:  Alert and cooperative. Normal mood and affect.  Intake/Output from previous day: No intake/output data recorded. Intake/Output this shift: No intake/output data recorded.  Lab Results: Recent Labs    02/03/23 1310  WBC 8.5  HGB 7.1*  HCT 23.2*  PLT 359   BMET Recent Labs    02/03/23 1310  NA 134*  K 3.9  CL 100  CO2 20*  GLUCOSE 149*  BUN 39*  CREATININE 1.79*  CALCIUM 9.7   LFT Recent Labs    02/03/23 1310  PROT 7.3  ALBUMIN 4.2  AST 20  ALT 24  ALKPHOS 46  BILITOT 0.2*   PT/INR No results for input(s): "LABPROT", "INR" in the last 72 hours. Hepatitis Panel No results for input(s): "HEPBSAG", "HCVAB", "HEPAIGM", "HEPBIGM" in the last 72 hours.    Studies/Results: No results found.  IMPRESSION/PLAN:  82 year old male with recurrent syncope with IDA with reported melenic stools x 6 months. Hemoglobin 7.2. He received 1 unit of PRBCs.  No active GI bleeding since arriving to the ED. -Clear liquid diet -N.p.o. after midnight -EGD and colonoscopy benefits and risks discussed including risk with sedation, risk of bleeding, perforation and infection  -Check H&H every 6 hours x 24 hours -Transfuse for Hg < 8 -PPI IV bid -Ondansetron 4 mg p.o. or IV every 6 hours. -Check iron levels in am, he will likely require a IV iron during this admission  History of GERD, patient denies taking PPI at home -See plan above   PAD, on ASA. No longer on Plavix  CHF, LV EF 45 - 50% per ECHO 01/2022  COPD, stable   DM II  Noralyn Pick  02/03/2023, 4:20PM   I have taken a history, reviewed the chart and examined the patient. I performed a substantive portion of this encounter, including complete performance of at least one of the key components, in conjunction with the APP. I agree with the APP's note, impression and recommendations  82 year old  male with symptomatic iron deficiency anemia, with dark stools but no recent colonoscopy.  Although dark stools may suggest an upper GI source, a colonoscopy is also reasonable, especially considering he has presented with iron deficiency anemia without overt bleeding in the past.  He also reports a 10lb unintentional weight loss over the past few months and has a heavy smoking history.  Will plan for EGD and colonoscopy tomorrow.  The details, risks (including bleeding, perforation, infection, missed lesions, medication reactions and possible hospitalization or surgery if complications occur), benefits, and alternatives to  EGD/colonoscopy with possible biopsy and possible polypectomy were discussed with the patient and he consents to proceed.   Nica Friske E. Candis Schatz, MD [facility]

## 2023-02-03 NOTE — ED Provider Notes (Signed)
[facility] Provider Note  CSN: [id number] Arrival date & time: 02/03/23 1244  Chief Complaint(s) Loss of Consciousness  HPI Russell [name] is a 82 y.o. male with PMH COPD, BPH, seizure disorder, glaucoma, T2DM, iron deficiency anemia no longer on iron who presents emergency department for evaluation of a syncopal event.  Patient was recently seen on 01/22/2023 for generalized weakness and was found to have a drop in his hemoglobin.  He received 1 unit packed red blood cells and had improvement of his symptoms and declined hospital admission for suspected GI bleed.  Today, he was at the gas station when he felt progressive generalized weakness and suffered a syncopal episode.  Patient found to be vasovagal by EMS with hypotension bradycardia.  On arrival, patient alert and oriented answering questions appropriately and vital signs of stabilized.  Patient currently denies chest pain, shortness of breath, headache, fever or other systemic symptoms.  Endorses mild crampy abdominal pain.  He does endorse dark stools.   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   COPD (chronic obstructive pulmonary disease) ([place])    Coronary atherosclerosis    denied knowledge of this 10/27/16   Depression    Diverticulosis of colon    DYSPHAGIA UNSPECIFIED 05/16/2009   Qualifier: Diagnosis of  By: Amil Amen MD, [place]     Dyspnea    EARLY  SATIETY 11/04/2007   Qualifier: Diagnosis of  By: Amil Amen MD, Benjamine Mola     ED (erectile dysfunction)    Full dentures    GERD (gastroesophageal reflux disease)    not current   Glaucoma    History of gastritis    History of scabies    2008   History of seizure    2013-- x2   idiopathic --  none since   History of syncope    03-22-2014  DX  VAGAL RESPONSE   Hyperlipidemia, mixed    Hypertension    Mitral regurgitation    Peripheral arterial disease ([place])    one vessel runoff bilaterally via peroneal arteries   Prostate cancer ([place])    Seizures ([place]) 03/2014   ? or syncope- patient refused to be admitted for further studies- "felt better"   Type 2 diabetes, diet controlled ([place])    patient and his wife said no- have not been told 01/21/17   Wears glasses    Patient Active Problem List   Diagnosis Date Noted   Gangrene of toe ([employer] [place])    Heart failure with mid-range ejection fraction ([place]) 02/14/2022   Chronic asymptomatic bacteriuria with pyuria 02/14/2022   Atherosclerosis of aorta ([place]) 02/14/2022   Cerebral atrophy ([place]) 02/14/2022   Glaucoma 02/14/2022   Syncope 02/13/2022   Pre-syncope 02/01/2022   Decreased mobility and endurance 11/19/2021   Frequent falls 11/19/2021   Acute on chronic intracranial subdural hematoma ([place]) 09/15/2021   CKD (chronic kidney disease) stage 3, GFR 30-59 ml/min (HCC) - baseline SCr 2.0 09/15/2021  Cognitive communication deficit 07/09/2021   Intracranial bleed ([place]) 07/07/2021   Fall at home, initial encounter    Traumatic hemorrhage of cerebrum without loss of consciousness ([place])    Primary osteoarthritis of left hand 05/28/2021   Neuroma of third interspace of foot 04/18/2019   Osteoarthritis 05/31/2018   Foot pain, left 05/11/2018   Acute renal failure superimposed on stage 3b chronic kidney disease ([place]) 06/23/2017   Surgery, elective    Peripheral neuropathy 01/27/2017   Peripheral arterial disease ([place]) 01/23/2016   DM type 2  (diabetes mellitus, type 2) ([place]) 02/22/2015   Anemia of renal disease 02/22/2015   Neuroma digital nerve 09/28/2013   Neuroma, 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   Arthralgia 08/22/2013   Mixed hyperlipidemia 08/22/2013   SEIZURE, 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   BENIGN PROSTATIC HYPERTROPHY, WITH URINARY OBSTRUCTION 06/13/2010   DYSPNEA ON EXERTION 05/15/2010   TOBACCO ABUSE 04/03/2010   COPD 04/03/2010   G E R D 04/03/2010   OTHER DYSPHAGIA 04/03/2010   DIABETES MELLITUS, TYPE II, CONTROLLED 01/14/2010   LUMBAR RADICULOPATHY, LEFT 05/16/2009   COUGH DUE TO ACE INHIBITORS 04/02/2009   DIZZINESS 07/27/2008   HYPERCHOLESTEROLEMIA, MIXED 05/11/2008   CATARACTS, BILATERAL 03/01/2008   MILD SPINAL STENOSIS, CERVICAL 01/12/2008   LEG CRAMPS 01/12/2008   DIVERTICULOSIS, COLON 08/31/2007   SCABIES 08/26/2007   ERECTILE DYSFUNCTION 08/18/2007   Alcohol abuse 08/18/2007   Essential hypertension 08/17/2007   HEMOCCULT POSITIVE STOOL 07/05/2007   ANEMIA, IRON DEFICIENCY NOS 05/31/2007   WEIGHT LOSS, ABNORMAL 05/10/2006   Home Medication(s) Prior to Admission medications   Medication Sig Start Date End Date Taking? Authorizing Provider  acetaminophen (TYLENOL) 325 MG tablet Take 2 tablets (650 mg total) by mouth every 4 (four) hours as needed for mild pain (or temp > 37.5 C (99.5 F)). 07/10/21   Samuella Cota, MD  ALPHAGAN P 0.1 % SOLN Place 1 drop into the right eye 3 (three) times daily. 03/23/22   [provider]  bimatoprost (LUMIGAN) 0.01 % SOLN Place 1 drop into both eyes at bedtime. 04/10/13   Dhungel, Nishant, MD  brimonidine (ALPHAGAN) 0.2 % ophthalmic solution Place 1 drop into both eyes 3 (three) times daily. 03/14/19   [provider]  Brinzolamide-Brimonidine (SIMBRINZA) 1-0.2 % SUSP Place 1 drop into both eyes 3 (three) times daily.    [provider]  clopidogrel (PLAVIX) 75 MG tablet TAKE 1 TABLET BY MOUTH EVERY DAY 06/01/22   Waynetta Sandy, MD  ferrous  sulfate 325 (65 FE) MG tablet Take 1 tablet (325 mg total) by mouth every other day for 100 doses. 02/16/22 09/04/22  Precious Gilding, DO  gabapentin (NEURONTIN) 300 MG capsule Take 300 mg by mouth daily. 02/25/21   [provider]  losartan (COZAAR) 100 MG tablet Take 100 mg by mouth daily. 04/13/22   [provider]  meclizine (ANTIVERT) 25 MG tablet Take 25 mg by mouth daily as needed. 11/24/22   [provider]  Multiple Vitamin (MULTIVITAMIN WITH MINERALS) TABS tablet Take 1 tablet by mouth daily. One a day    [provider]  MYRBETRIQ 25 MG TB24 tablet Take 25 mg by mouth daily. 02/12/20   [provider]  oxybutynin (DITROPAN-XL) 10 MG 24 hr tablet Take 10 mg by mouth daily. 11/22/22   [provider]  oxyCODONE-acetaminophen (PERCOCET) 10-325 MG tablet Take 1 tablet by mouth every 4 (four) hours as needed for pain. 07/28/22   Hyatt, Max T, DPM  oxyCODONE-acetaminophen (PERCOCET) [phone]  MG tablet Take 1 tablet by mouth every 8 (eight) hours as needed for up to 7 days for pain. 02/02/23 02/09/23  Hyatt, Max T, DPM  pantoprazole (PROTONIX) 40 MG tablet Take 1 tablet (40 mg total) by mouth daily. 02/04/22 03/06/22  Gerrit Heck, MD  pravastatin (PRAVACHOL) 80 MG tablet Take 80 mg by mouth daily.  10/19/16   [provider]  tamsulosin (FLOMAX) 0.4 MG CAPS capsule Take by mouth daily. 10/23/22   [provider]                                                                                                                                    Past Surgical History Past Surgical History:  Procedure Laterality Date   ABDOMINAL AORTOGRAM W/LOWER EXTREMITY Left 12/18/2020   Procedure: ABDOMINAL AORTOGRAM W/LOWER EXTREMITY;  Surgeon: Cherre Robins, MD;  Location: [facility];  Service: Cardiovascular;  Laterality: Left;   ABDOMINAL AORTOGRAM W/LOWER EXTREMITY N/A 02/26/2021   Procedure: ABDOMINAL AORTOGRAM W/LOWER EXTREMITY;  Surgeon:  Cherre Robins, MD;  Location: [facility];  Service: Cardiovascular;  Laterality: N/A;   ANTERIOR CERVICAL DECOMP/DISCECTOMY FUSION  10-16-2009   C4 -- C6   ANTERIOR CERVICAL DECOMP/DISCECTOMY FUSION N/A 01/27/2017   Procedure: ANTERIOR CERVICAL DECOMPRESSION FUSION CERVICAL 3-4 WITH INSTRUMENTATION AND ALLOGRAFT;  Surgeon: Phylliss Bob, MD;  Location: [facility];  Service: Orthopedics;  Laterality: N/A;  ANTERIOR CERVICAL DECOMPRESSION FUSION CERVICAL 3-4 WITH INSTRUMENTATION AND ALLOGRAFT   CAPSULOTOMY Right 01/26/2013   Procedure: MINOR CAPSULOTOMY;  Surgeon: Myrtha Mantis., MD;  Location: [facility];  Service: Ophthalmology;  Laterality: Right;   CARPAL TUNNEL RELEASE Left 01/02/2019   Procedure: LEFT CARPAL TUNNEL RELEASE;  Surgeon: Leanora Cover, MD;  Location: [facility];  Service: Orthopedics;  Laterality: Left;   CARPECTOMY Right 03/13/2014   Procedure: RIGHT  PROXIMAL ROW CARPECTOMY;  Surgeon: Tennis Must, MD;  Location: [facility];  Service: Orthopedics;  Laterality: Right;   CARPECTOMY Left 10/22/2015   Procedure: LEFT WRIST PROXIMAL ROW CARPECTOMY ;  Surgeon: Leanora Cover, MD;  Location: [facility];  Service: Orthopedics;  Laterality: Left;   CARPOMETACARPAL ([place]) FUSION OF THUMB Right 03/13/2014   Procedure: RIGHT FUSION OF THUMB CARPOMETACARPAL [facility]) JOINT;  Surgeon: Tennis Must, MD;  Location: [facility];  Service: Orthopedics;  Laterality: Right;   CARPOMETACARPEL SUSPENSION PLASTY Left 04/04/2020   Procedure: LEFT THUMB TRAPECIECTOMY AND SUSPENSIONPLASTY;  Surgeon: Leanora Cover, MD;  Location: [facility];  Service: Orthopedics;  Laterality: Left;   CATARACT EXTRACTION W/ INTRAOCULAR LENS  IMPLANT, BILATERAL  2009   COLONOSCOPY  08-31-2007   COLONOSCOPY     CRYOABLATION N/A 01/14/2015   Procedure: CRYO ABLATION PROSTATE;  Surgeon: Ailene Rud, MD;  Location: [facility];   Service: Urology;  Laterality: N/A;   ESOPHAGOGASTRODUODENOSCOPY  last one 09-11-2008  EXCISION MASS LEFT FACE , SUBORBITAL AREA, PLASTER RECONSTRUCTION  02-09-2011   EXCISIONAL DEBRIDEMENT AND REPAIR RIGHT QUADRICEP TENDON  07-05-2000   FOOT SURGERY Left    I & D EXTREMITY Right 05/24/2013   Procedure: RIGHT INDEX WOUND DEBRIDEMENT AND CLOSURE;  Surgeon: Jolyn Nap, MD;  Location: [facility];  Service: Orthopedics;  Laterality: Right;   INSERTION OF SUPRAPUBIC CATHETER N/A 01/14/2015   Procedure: SUPRAPUBIC TUBE PLACEMENT;  Surgeon: Ailene Rud, MD;  [facility];  Service: Urology;  Laterality: N/A;   KNEE ARTHROSCOPY Right 2008   LARYNGOSCOPY AND ESOPHAGOSCOPY  06-13-2010   MARSUPIALIZATION LEFT LARGE VALLECULAR CYST   MASS EXCISION Left 10/22/2015   Procedure: EXCISION MASS OF LEFT INDEX FINGER;  Surgeon: Leanora Cover, MD;  Location: [facility];  Service: Orthopedics;  Laterality: Left;   ORCHIECTOMY Left 02/24/2015   Procedure: SCROTAL EXPLORATION WITH LEFT ORCHIECTOMY;  Surgeon: Kathie Rhodes, MD;  Location: [facility];  Service: Urology;  Laterality: Left;   PERIPHERAL VASCULAR INTERVENTION Left 02/26/2021   Procedure: PERIPHERAL VASCULAR INTERVENTION;  Surgeon: Cherre Robins, MD;  Location: [facility];  Service: Cardiovascular;  Laterality: Left;  Superficial femoral   POSTERIOR CERVICAL FUSION/FORAMINOTOMY N/A 01/28/2017   Procedure: POSTERIOR SPINAL FUSION CERVICAL 3-4, CERVICAL 4-5, CERVICAL 5-6, CERVICAL 6-7 WITH INSTRUMENATION AND ALLOGRAFT;  Surgeon: Phylliss Bob, MD;  Location: [facility];  Service: Orthopedics;  Laterality: N/A;  POSTERIOR SPINAL FUSION CERVICAL 3-4, CERVICAL 4-5, CERVICAL 5-6, CERVICAL 6-7 WITH INSTRUMENATION AND ALLOGRAFT   SHOULDER ARTHROSCOPY/ DEBRIDEMENT LABRAL TEAR/  BICEPS TENOTOMY Left 09-24-2011   TONSILLECTOMY  as child   TRANSTHORACIC ECHOCARDIOGRAM  02-21-2010   normal LVF/  ef 55-60%/ mild  to moderate MR/  moderate LAE/  mild TR   YAG LASER APPLICATION Right [id number]   Procedure: YAG LASER APPLICATION;  Surgeon: Myrtha Mantis., MD;  [facility];  Service: Ophthalmology;  Laterality: Right;   YAG LASER CAPSULOTOMY, LEFT EYE  01-08-2011   Family History Family History  Problem Relation Age of Onset   Unexplained death Mother        died at a young age, no known cause   Heart attack Father        age unknown   Diabetes Other    Cancer Other     Social History Social History   Tobacco Use   Smoking status: Every Day    Packs/day: 1.00    Years: 59.00    Total pack years: 59.00    Types: Cigarettes   Smokeless tobacco: Never   Tobacco comments:    Started Chantix 10/26/16  Vaping Use   Vaping Use: Never used  Substance Use Topics   Alcohol use: No    Comment: none since approx 2014- heavy before   Drug use: No   Allergies Tramadol  Review of Systems Review of Systems  Gastrointestinal:  Positive for abdominal pain and blood in stool.  Neurological:  Positive for syncope.    Physical Exam Vital Signs  I have reviewed the triage vital signs BP 126/63   Pulse (!) 43   Temp 98.1 F (36.7 C) (Oral)   Resp 20   Ht 6' 2"$  (1.88 m)   SpO2 98%   BMI 18.04 kg/m   Physical Exam Vitals and nursing note reviewed.  Constitutional:      General: He is not in acute distress.    Appearance: He is well-developed.  HENT:  Head: Normocephalic and atraumatic.  Eyes:     Conjunctiva/sclera: Conjunctivae normal.  Cardiovascular:     Rate and Rhythm: Normal rate and regular rhythm.     Heart sounds: No murmur heard. Pulmonary:     Effort: Pulmonary effort is normal. No respiratory distress.     Breath sounds: Normal breath sounds.  Abdominal:     Palpations: Abdomen is soft.     Tenderness: There is abdominal tenderness.  Musculoskeletal:        General: No swelling.     Cervical back: Neck supple.  Skin:    General: Skin is warm and dry.      Capillary Refill: Capillary refill takes less than 2 seconds.     Coloration: Skin is pale.  Neurological:     Mental Status: He is alert.  Psychiatric:        Mood and Affect: Mood normal.     ED Results and Treatments Labs (all labs ordered are listed, but only abnormal results are displayed) Labs Reviewed  COMPREHENSIVE METABOLIC PANEL - Abnormal; Notable for the following components:      Result Value   Sodium 134 (*)    CO2 20 (*)    Glucose, Bld 149 (*)    BUN 39 (*)    Creatinine, Ser 1.79 (*)    Total Bilirubin 0.2 (*)    GFR, Estimated 37 (*)    All other components within normal limits  CBC WITH DIFFERENTIAL/PLATELET - Abnormal; Notable for the following components:   RBC 2.97 (*)    Hemoglobin 7.1 (*)    HCT 23.2 (*)    MCV 78.1 (*)    MCH 23.9 (*)    RDW 20.3 (*)    Lymphs Abs 0.5 (*)    All other components within normal limits  POC OCCULT BLOOD, ED - Abnormal; Notable for the following components:   Fecal Occult Bld POSITIVE (*)    All other components within normal limits  LIPASE, BLOOD  PREPARE RBC (CROSSMATCH)  TYPE AND SCREEN  TROPONIN I (HIGH SENSITIVITY)  TROPONIN I (HIGH SENSITIVITY)                                                                                                                          Radiology No results found.  Pertinent labs & imaging results that were available during my care of the patient were reviewed by me and considered in my medical decision making (see MDM for details).  Medications Ordered in ED Medications  lactated ringers bolus 1,000 mL (has no administration in time range)  pantoprazole (PROTONIX) injection 40 mg (40 mg Intravenous Given 02/03/23 1400)  0.9 %  sodium chloride infusion (Manually program via Guardrails IV Fluids) ( Intravenous New Bag/Given 02/03/23 1402)  Procedures Procedures  (including critical care time)  Medical Decision Making / ED Course   This patient presents to the ED for concern of syncope, dark stools, this involves an extensive number of treatment options, and is a complaint that carries with it a high risk of complications and morbidity.  The differential diagnosis includes orthostatic syncope, symptomatic anemia, GI bleed, vasovagal syncope, dehydration  MDM: Patient seen emergency room for evaluation of a syncopal episode and dark stools.  Physical exam reveals a cachectic appearing patient with significant fatigue and mild generalized abdominal tenderness.  Fecal occult positive.  Laboratory evaluation with a hemoglobin of 7.1 and MCV of 78.1.  Given that the patient recently had a blood transfusion and his anemia has returned to this, I am concerned that he is continuing to have an occult GI bleed.  BUN is 39 creatinine 1.79 which is slightly elevated from baseline and patient given lactated Ringer's.  Spoke with [facility] who will evaluate the patient inpatient.  1 unit packed red blood cells ordered for symptomatic anemia and patient admitted to the hospital service.  PPI initiated.   Additional history obtained:  -External records from outside source obtained and reviewed including: Chart review including previous notes, labs, imaging, consultation notes   Lab Tests: -I ordered, reviewed, and interpreted labs.   The pertinent results include:   Labs Reviewed  COMPREHENSIVE METABOLIC PANEL - Abnormal; Notable for the following components:      Result Value   Sodium 134 (*)    CO2 20 (*)    Glucose, Bld 149 (*)    BUN 39 (*)    Creatinine, Ser 1.79 (*)    Total Bilirubin 0.2 (*)    GFR, Estimated 37 (*)    All other components within normal limits  CBC WITH DIFFERENTIAL/PLATELET - Abnormal; Notable for the following components:   RBC 2.97 (*)    Hemoglobin 7.1 (*)    HCT 23.2 (*)    MCV 78.1 (*)    MCH 23.9  (*)    RDW 20.3 (*)    Lymphs Abs 0.5 (*)    All other components within normal limits  POC OCCULT BLOOD, ED - Abnormal; Notable for the following components:   Fecal Occult Bld POSITIVE (*)    All other components within normal limits  LIPASE, BLOOD  PREPARE RBC (CROSSMATCH)  TYPE AND SCREEN  TROPONIN I (HIGH SENSITIVITY)  TROPONIN I (HIGH SENSITIVITY)      EKG   EKG Interpretation  Date/Time:  Wednesday February 03 2023 12:52:57 EST Ventricular Rate:  56 PR Interval:  166 QRS Duration: 110 QT Interval:  454 QTC Calculation: 439 R Axis:   58 Text Interpretation: Sinus bradycardia with PVC Confirmed by Anhthu Perdew ([id number]) on 02/03/2023 1:27:28 PM         Medicines ordered and prescription drug management: Meds ordered this encounter  Medications   pantoprazole (PROTONIX) injection 40 mg   0.9 %  sodium chloride infusion (Manually program via Guardrails IV Fluids)   lactated ringers bolus 1,000 mL    -I have reviewed the patients home medicines and have made adjustments as needed  Critical interventions Red blood cell administration  Consultations Obtained: I requested consultation with the [facility] gastroenterology PA Carl Best,  and discussed lab and imaging findings as well as pertinent plan - they recommend: Inpatient evaluation, PPI   Cardiac Monitoring: The patient was maintained on a cardiac monitor.  I personally viewed and interpreted the cardiac monitored  which showed an underlying rhythm of: NSR, multiple PVCs, sinus bradycardia  Social Determinants of Health:  Factors impacting patients care include: none   Reevaluation: After the interventions noted above, I reevaluated the patient and found that they have :improved  Co morbidities that complicate the patient evaluation  Past Medical History:  Diagnosis Date   AKI (acute kidney injury) ([place]) 06/23/2017   ARM PAIN 06/03/2010   Qualifier: Diagnosis of  By: Allean Found, LPN, [place], LUMBAR   Atherosclerosis of aorta ([place]) 02/14/2022   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   BPH with obstruction/lower urinary tract symptoms    BPH with urinary obstruction 01/15/2016   Cerebral atrophy ([place]) 02/14/2022   Cervical radiculopathy 01/27/2017   Chronic asymptomatic bacteriuria with pyuria 02/14/2022   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   COPD (chronic obstructive pulmonary disease) ([place])    Coronary atherosclerosis    denied knowledge of this 10/27/16   Depression    Diverticulosis of colon    DYSPHAGIA UNSPECIFIED 05/16/2009   Qualifier: Diagnosis of  By: Amil Amen MD, [place]     Dyspnea    EARLY SATIETY 11/04/2007   Qualifier: Diagnosis of  By: Amil Amen MD, Benjamine Mola     ED (erectile dysfunction)    Full dentures    GERD (gastroesophageal reflux disease)    not current   Glaucoma    History of gastritis    History of scabies    2008   History of seizure    2013-- x2   idiopathic --  none since   History of syncope    03-22-2014  DX  VAGAL RESPONSE   Hyperlipidemia, mixed    Hypertension    Mitral regurgitation    Peripheral arterial disease ([place])    one vessel runoff bilaterally via peroneal arteries   Prostate cancer ([place])    Seizures ([place]) 03/2014   ? or syncope- patient refused to be admitted for further studies- "felt better"   Type 2 diabetes, diet controlled ([place])    patient and his wife said no- have not been told 01/21/17   Wears glasses       Dispostion: I considered admission for this patient, and due to suspected upper GI bleed, symptomatic anemia patient will require hospital admission     Final Clinical Impression(s) / ED Diagnoses Final diagnoses:  Syncope and collapse  Gastrointestinal hemorrhage, unspecified gastrointestinal hemorrhage type     @PCDICTATION$ @    Teressa Lower, MD 02/03/23 1440

## 2023-02-04 ENCOUNTER — Encounter (HOSPITAL_COMMUNITY)
Admission: EM | Disposition: A | Discharge status: Home / Self Care | Payer: Self-pay | Source: Home / Self Care | Attending: Internal Medicine

## 2023-02-04 ENCOUNTER — Inpatient Hospital Stay (HOSPITAL_COMMUNITY): Payer: 59 | Admitting: Anesthesiology

## 2023-02-04 ENCOUNTER — Encounter (HOSPITAL_COMMUNITY): Payer: Self-pay | Admitting: Internal Medicine

## 2023-02-04 ENCOUNTER — Other Ambulatory Visit: Payer: Self-pay

## 2023-02-04 DIAGNOSIS — G319 Degenerative disease of nervous system, unspecified: Secondary | ICD-10-CM

## 2023-02-04 DIAGNOSIS — D500 Iron deficiency anemia secondary to blood loss (chronic): Secondary | ICD-10-CM

## 2023-02-04 DIAGNOSIS — R55 Syncope and collapse: Secondary | ICD-10-CM

## 2023-02-04 DIAGNOSIS — F17210 Nicotine dependence, cigarettes, uncomplicated: Secondary | ICD-10-CM

## 2023-02-04 DIAGNOSIS — K31811 Angiodysplasia of stomach and duodenum with bleeding: Secondary | ICD-10-CM

## 2023-02-04 DIAGNOSIS — I10 Essential (primary) hypertension: Secondary | ICD-10-CM

## 2023-02-04 DIAGNOSIS — K449 Diaphragmatic hernia without obstruction or gangrene: Secondary | ICD-10-CM

## 2023-02-04 DIAGNOSIS — K31819 Angiodysplasia of stomach and duodenum without bleeding: Secondary | ICD-10-CM

## 2023-02-04 DIAGNOSIS — R001 Bradycardia, unspecified: Secondary | ICD-10-CM | POA: Diagnosis not present

## 2023-02-04 DIAGNOSIS — D649 Anemia, unspecified: Secondary | ICD-10-CM | POA: Diagnosis not present

## 2023-02-04 DIAGNOSIS — K922 Gastrointestinal hemorrhage, unspecified: Secondary | ICD-10-CM

## 2023-02-04 DIAGNOSIS — I2510 Atherosclerotic heart disease of native coronary artery without angina pectoris: Secondary | ICD-10-CM

## 2023-02-04 HISTORY — PX: HOT HEMOSTASIS: SHX5433

## 2023-02-04 HISTORY — PX: ESOPHAGOGASTRODUODENOSCOPY (EGD) WITH PROPOFOL: SHX5813

## 2023-02-04 LAB — HEMOGLOBIN AND HEMATOCRIT, BLOOD
HCT: 21.4 % — ABNORMAL LOW (ref 39.0–52.0)
HCT: 30.1 % — ABNORMAL LOW (ref 39.0–52.0)
Hemoglobin: 6.6 g/dL — CL (ref 13.0–17.0)
Hemoglobin: 9.9 g/dL — ABNORMAL LOW (ref 13.0–17.0)

## 2023-02-04 LAB — PREPARE RBC (CROSSMATCH)

## 2023-02-04 LAB — HEMOGLOBIN: Hemoglobin: 6.9 g/dL — CL (ref 13.0–17.0)

## 2023-02-04 SURGERY — ESOPHAGOGASTRODUODENOSCOPY (EGD) WITH PROPOFOL
Anesthesia: Monitor Anesthesia Care

## 2023-02-04 MED ORDER — FENTANYL CITRATE (PF) 100 MCG/2ML IJ SOLN: 25.0000 ug | INTRAMUSCULAR | Status: DC | PRN

## 2023-02-04 MED ORDER — PROPOFOL 500 MG/50ML IV EMUL
INTRAVENOUS | Status: DC | PRN
  Administered 2023-02-04: 30 mg via INTRAVENOUS
  Administered 2023-02-04: 75 ug/kg/min via INTRAVENOUS

## 2023-02-04 MED ORDER — LACTATED RINGERS IV SOLN: INTRAVENOUS | Status: DC

## 2023-02-04 MED ORDER — ACETAMINOPHEN 160 MG/5ML PO SOLN: 325.0000 mg | ORAL | Status: DC | PRN

## 2023-02-04 MED ORDER — OXYCODONE HCL 5 MG/5ML PO SOLN: 5.0000 mg | Freq: Once | ORAL | Status: DC | PRN

## 2023-02-04 MED ORDER — OXYCODONE HCL 5 MG PO TABS: 5.0000 mg | ORAL_TABLET | Freq: Once | ORAL | Status: DC | PRN

## 2023-02-04 MED ORDER — ONDANSETRON HCL 4 MG/2ML IJ SOLN: 4.0000 mg | Freq: Once | INTRAMUSCULAR | Status: DC | PRN

## 2023-02-04 MED ORDER — SODIUM CHLORIDE 0.9 % IV SOLN: INTRAVENOUS | Status: DC | PRN

## 2023-02-04 MED ORDER — SODIUM CHLORIDE 0.9 % IV SOLN: INTRAVENOUS | Status: DC

## 2023-02-04 MED ORDER — ACETAMINOPHEN 325 MG PO TABS: 325.0000 mg | ORAL_TABLET | ORAL | Status: DC | PRN

## 2023-02-04 MED ORDER — MEPERIDINE HCL 25 MG/ML IJ SOLN: 6.2500 mg | INTRAMUSCULAR | Status: DC | PRN

## 2023-02-04 MED ORDER — SODIUM CHLORIDE 0.9% IV SOLUTION: Freq: Once | INTRAVENOUS | Status: AC

## 2023-02-04 MED ORDER — LIDOCAINE 2% (20 MG/ML) 5 ML SYRINGE
INTRAMUSCULAR | Status: DC | PRN
  Administered 2023-02-04: 40 mg via INTRAVENOUS

## 2023-02-04 MED ORDER — PHENYLEPHRINE HCL-NACL 20-0.9 MG/250ML-% IV SOLN
INTRAVENOUS | Status: DC | PRN
  Administered 2023-02-04: 25 ug/min via INTRAVENOUS

## 2023-02-04 SURGICAL SUPPLY — 25 items

## 2023-02-04 NOTE — Progress Notes (Signed)
Internal Medicine Attending:   I saw and examined the patient. I reviewed the resident's H&P note and I agree with the resident's findings and plan as documented in the resident's note.  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  Patient was recently seen in the ED on February 2 and found to symptomatic anemia likely secondary to GI bleed.  He did receive 1 unit PRBC but refused admission at that time.  Patient states that he has continued to have episodes of lightheadedness, generalized weakness as well as dark black stools.  He was at a gas station earlier yesterday and had a syncopal episode for which EMS was called and he was brought to the ED.  In the ED, patient was noted to be hypotensive with systolic blood pressures in the 80s and bradycardic with heart rate in the 40s.  He was also noted to have a decreased hemoglobin of 7.1 and FOBT positive and was admitted to our service for further evaluation.  Today, patient states that he feels a little better but still having dark stools.  States that he had 2-3 episodes since admission.  Vitals:   02/04/23 1348 02/04/23 1355  BP: (!) 123/56   Pulse: (!) 58 (!) 57  Resp: (!) 22 (!) 21  Temp:    SpO2: 100% 100%    On exam, patient is lying in bed in no apparent distress.  Lungs are clear to auscultation bilaterally.  Cardiovascular exam reveals regular rate and rhythm with normal heart sounds.  Abdomen is soft, mild tenderness noted in suprapubic and right lower quadrant areas, nondistended with normoactive bowel sounds.  Lower extremities are nontender  to palpation with no edema noted.  Patient's mood and affect are normal and he is oriented x 3.  Assessment and plan:  1.  Syncope likely secondary to hypovolemia from acute blood loss anemia: -Patient presented to ED with a syncopal episode in setting of acute blood loss anemia secondary to GI bleed.  Etiology of his syncope is likely secondary to hypovolemia and hypotension with presenting systolic blood pressures in the 80s.  Patient history of subdural hematomas after a fall and had a CT head done here which showed no acute pathology. -Patient received IV fluids overnight with improvement in his blood pressure with systolics in the 0000000 to [id number] -Patient also on the bradycardic in the 40s on admission but he remains in normal sinus rhythm with heart rates in the 50s to 60s currently. 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  There were nonbleeding at the time. -We will continue with PPI twice daily for 4 weeks -Resume full liquid diet today -Patient's hemoglobin today decreased to 6.6.  Will transfuse 2 units PRBC and follow posttransfusion CBC -If patient has persistent bleeding will need a colonoscopy on this admission -No further workup at this time  2.  Fevers: -Patient had a fever of 102 F yesterday.  The etiology behind his fevers remain uncertain at this time.  Chest x-ray with no acute infiltrates. -Patient was noted to have some suprapubic and right lower quadrant tenderness on exam today  and also complains of some increased urinary frequency.  Will check a UA to rule out UTI -Continue broad-spectrum antibiotics for now with cefepime and vancomycin -Blood cultures no growth to date -Will continue to monitor closely

## 2023-02-04 NOTE — Anesthesia Preprocedure Evaluation (Addendum)
Anesthesia Evaluation  Patient identified by MRN, date of birth, ID band Patient awake    Reviewed: Allergy & Precautions, H&P , NPO status , Patient's Chart, lab work & pertinent test results  Airway Mallampati: II   Neck ROM: full    Dental  (+) Edentulous Upper, Edentulous Lower,    Pulmonary shortness of breath, COPD, Current Smoker and Patient abstained from smoking.   breath sounds clear to auscultation       Cardiovascular hypertension, + CAD and + Peripheral Vascular Disease   Rhythm:regular Rate:Normal  ECHO 2/23 1. Left ventricular ejection fraction, by estimation, is 45 to 50%. The  left ventricle has mildly decreased function. The left ventricle  demonstrates global hypokinesis. There is moderate left ventricular  hypertrophy. Left ventricular diastolic  parameters are indeterminate.   2. Right ventricular systolic function is normal. The right ventricular  size is normal.   3. The mitral valve is myxomatous. Mild mitral valve regurgitation.   4. The aortic valve is tricuspid. Aortic valve regurgitation is not  visualized. No aortic stenosis is present.     Neuro/Psych Seizures -,  PSYCHIATRIC DISORDERS  Depression     Neuromuscular disease    GI/Hepatic ,GERD  ,,  Endo/Other  diabetes, Type 2    Renal/GU Renal disease   Prostate CA    Musculoskeletal  (+) Arthritis ,    Abdominal   Peds  Hematology  (+) Blood dyscrasia, anemia   Anesthesia Other Findings   Reproductive/Obstetrics                             Anesthesia Physical Anesthesia Plan  ASA: 4  Anesthesia Plan: MAC   Post-op Pain Management: Minimal or no pain anticipated   Induction: Intravenous  PONV Risk Score and Plan: 0 and Propofol infusion and Treatment may vary due to age or medical condition  Airway Management Planned: Simple Face Mask, Natural Airway and Mask  Additional Equipment:  None  Intra-op Plan:   Post-operative Plan:   Informed Consent: I have reviewed the patients History and Physical, chart, labs and discussed the procedure including the risks, benefits and alternatives for the proposed anesthesia with the patient or authorized representative who has indicated his/her understanding and acceptance.       Plan Discussed with: CRNA and Anesthesiologist  Anesthesia Plan Comments:         Anesthesia Quick Evaluation

## 2023-02-04 NOTE — Interval H&P Note (Signed)
History and Physical Interval Note:  02/04/2023 12:40 PM  Russell [name]  has presented today for surgery, with the diagnosis of Anemia, GI bleed.  The various methods of treatment have been discussed with the patient and family. After consideration of risks, benefits and other options for treatment, the patient has consented to  Procedure(s): ESOPHAGOGASTRODUODENOSCOPY (EGD) WITH PROPOFOL (N/A) as a surgical intervention.  The patient's history has been reviewed, patient examined, no change in status, stable for surgery.  I have reviewed the patient's chart and labs.  Questions were answered to the patient's satisfaction.    Although we had planned to do a colonoscopy as well as a colonoscopy today, the patient continued to have brown stools after completing his bowel prep and he refused to do more bowel prep this morning.  Therefore, we will proceed with an upper endoscopy today and if no bleeding source is identified, then we will reattempt colonoscopy tomorrow.  The patient's hgb drifted slightly to 6.6 from 7.1 and so he is getting 1 unit pRBCs currently.  Daryel November

## 2023-02-04 NOTE — Anesthesia Procedure Notes (Signed)
Procedure Name: MAC Date/Time: 02/04/2023 12:51 PM  Performed by: Leonor Liv, CRNAPre-anesthesia Checklist: Patient identified, Suction available, Emergency Drugs available, Patient being monitored and Timeout performed Patient Re-evaluated:Patient Re-evaluated prior to induction Oxygen Delivery Method: Nasal cannula Preoxygenation: Pre-oxygenation with 100% oxygen Placement Confirmation: positive ETCO2 Dental Injury: Teeth and Oropharynx as per pre-operative assessment  Comments: Optiflow nasal cannula

## 2023-02-04 NOTE — Progress Notes (Signed)
   02/03/23 1945  Assess: MEWS Score  Temp (!) 102.2 F (39 C)  BP 119/60  MAP (mmHg) 78  ECG Heart Rate 63  Resp 15  Level of Consciousness Alert  Assess: MEWS Score  MEWS Temp 2  MEWS Systolic 0  MEWS Pulse 0  MEWS RR 0  MEWS LOC 0  MEWS Score 2  MEWS Score Color Yellow  Assess: if the MEWS score is Yellow or Red  Were vital signs taken at a resting state? Yes  Does the patient meet 2 or more of the SIRS criteria? No  MEWS guidelines implemented  Yes, yellow  Treat  MEWS Interventions Considered administering scheduled or prn medications/treatments as ordered  Take Vital Signs  Increase Vital Sign Frequency  Yellow: Q2hr x1, continue Q4hrs until patient remains green for 12hrs  Escalate  MEWS: Escalate Yellow: Discuss with charge nurse and consider notifying provider and/or RRT  Notify: Charge Nurse/RN  Name of Charge Nurse/RN Notified [facility]  Provider Notification  Provider Name/Title Dr. Virl Axe  Date Provider Notified 02/03/23  Time Provider Notified 1945  Method of Notification Page  Notification Reason Change in status  Provider response See new orders  Assess: SIRS CRITERIA  SIRS Temperature  1  SIRS Pulse 0  SIRS Respirations  0  SIRS WBC 0  SIRS Score Sum  1   Patient is afebrile at this time lying in bed and denies complaint

## 2023-02-04 NOTE — Anesthesia Postprocedure Evaluation (Signed)
Anesthesia Post Note  Patient: Russell [name]  Procedure(s) Performed: ESOPHAGOGASTRODUODENOSCOPY (EGD) WITH PROPOFOL HOT HEMOSTASIS (ARGON PLASMA COAGULATION/BICAP)     Patient location during evaluation: PACU Anesthesia Type: MAC Level of consciousness: awake and alert Pain management: pain level controlled Vital Signs Assessment: post-procedure vital signs reviewed and stable Respiratory status: spontaneous breathing, nonlabored ventilation, respiratory function stable and patient connected to nasal cannula oxygen Cardiovascular status: stable and blood pressure returned to baseline Postop Assessment: no apparent nausea or vomiting Anesthetic complications: no   No notable events documented.  Last Vitals:  Vitals:   02/04/23 1404 02/04/23 1423  BP:  126/69  Pulse:    Resp: 18 18  Temp:    SpO2:      Last Pain:  Vitals:   02/04/23 1344  TempSrc: Temporal  PainSc:                  Katrine Radich

## 2023-02-04 NOTE — Op Note (Signed)
[facility] Patient Name: Russell [name] Procedure Date : 02/04/2023 MRN: [id number] Attending MD: Gladstone Pih. Candis Schatz , MD, [id number] Date of Birth: 1941-04-10 CSN: [id number] Age: 82 Admit Type: Inpatient Procedure:                Upper GI endoscopy Indications:              Iron deficiency anemia secondary to chronic blood                            loss, Melena: Patient was scheduled for a                            colonoscopy today as well, but the patient                            continued to have brown stools after his bowel prep                            and refused further prep. Providers:                Gladstone Pih. Candis Schatz, MD, Mikey College, RN,                            Luan Moore, Technician, Trixie Deis, CRNA Referring MD:              Medicines:                Monitored Anesthesia Care Complications:            No immediate complications. Estimated Blood Loss:     Estimated blood loss was minimal. Procedure:                Pre-Anesthesia Assessment:                           - Prior to the procedure, a History and Physical                            was performed, and patient medications and                            allergies were reviewed. The patient's tolerance of                            previous anesthesia was also reviewed. The risks                            and benefits of the procedure and the sedation                            options and risks were discussed with the patient.                            All questions were answered, and informed consent  was obtained. Prior Anticoagulants: The patient has                            taken no anticoagulant or antiplatelet agents                            except for aspirin. ASA Grade Assessment: IV - A                            patient with severe systemic disease that is a                            constant threat to life. After reviewing the risks                             and benefits, the patient was deemed in                            satisfactory condition to undergo the procedure.                           After obtaining informed consent, the endoscope was                            passed under direct vision. Throughout the                            procedure, the patient's blood pressure, pulse, and                            oxygen saturations were monitored continuously. The                            [id number] Olympus endoscope was introduced                            through the mouth, and advanced to the second part                            of duodenum. The upper GI endoscopy was                            accomplished without difficulty. The patient                            tolerated the procedure well. Scope In: Scope Out: Findings:      The examined portions of the nasopharynx, oropharynx and larynx were       normal.      The examined esophagus was normal.      Two 1 to 2 mm angiodysplastic lesions with no bleeding were found in the       cardia and in the gastric body. Coagulation for tissue destruction using       argon plasma was successful. Estimated blood loss was  minimal.      A small hiatal hernia was present.      Hematin (altered blood/coffee-ground-like material) was found in the       gastric body.      The exam of the stomach was otherwise normal.      A single 3 mm angiodysplastic lesion without bleeding was found in the       second portion of the duodenum. Coagulation for tissue destruction using       argon plasma was successful. Estimated blood loss: none.      The exam of the duodenum was otherwise normal. Impression:               - The examined portions of the nasopharynx,                            oropharynx and larynx were normal.                           - Normal esophagus.                           - Two non-bleeding angiodysplastic lesions in the                             stomach. Treated with argon plasma coagulation                            (APC).                           - Small hiatal hernia.                           - Hematin (altered blood/coffee-ground-like                            material) in the gastric body.                           - A single non-bleeding angiodysplastic lesion in                            the duodenum. Treated with argon plasma coagulation                            (APC).                           - No specimens collected.                           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 It is possible                            that there  may be more AVMs in the small intestine                            or colon. Moderate Sedation:      N/A Recommendation:           - Return patient to hospital ward for ongoing care.                           - Full liquid diet today.                           - Use Protonix (pantoprazole) 40 mg PO BID for 6                            weeks to allow APC sites to heal.                           - Trend CBC daily.                           - Consider discharge tomorrow if no further                            evidence of bleeding.                           - Would recommend colonoscopy if patient                            demonstrates further bleeding Procedure Code(s):        --- Professional ---                           [id number], Esophagogastroduodenoscopy, flexible,                            transoral; with ablation of tumor(s), polyp(s), or                            other lesion(s) (includes pre- and post-dilation                            and guide wire passage, when performed) Diagnosis Code(s):        --- Professional ---                           K31.819, Angiodysplasia of stomach and duodenum                            without bleeding                           K44.9, Diaphragmatic hernia without  obstruction or                            gangrene  K92.2, Gastrointestinal hemorrhage, unspecified                           D50.0, Iron deficiency anemia secondary to blood                            loss (chronic)                           K92.1, Melena (includes Hematochezia) CPT copyright 2022 [employer]. All rights reserved. The codes documented in this report are preliminary and upon coder review may  be revised to meet current compliance requirements. Rakeya Glab E. Candis Schatz, MD 02/04/2023 1:38:33 PM This report has been signed electronically. Number of Addenda: 0

## 2023-02-04 NOTE — Transfer of Care (Signed)
Immediate Anesthesia Transfer of Care Note  Patient: JAYMESON REEDUS  Procedure(s) Performed: ESOPHAGOGASTRODUODENOSCOPY (EGD) WITH PROPOFOL HOT HEMOSTASIS (ARGON PLASMA COAGULATION/BICAP)  Patient Location: Endoscopy Unit  Anesthesia Type:MAC  Level of Consciousness: awake, alert , and oriented  Airway & Oxygen Therapy: Patient Spontanous Breathing  Post-op Assessment: Report given to RN and Post -op Vital signs reviewed and stable  Post vital signs: Reviewed and stable  Last Vitals:  Vitals Value Taken Time  BP 109/59 02/04/23 1344  Temp 36.7 C 02/04/23 1344  Pulse 58 02/04/23 1350  Resp 21 02/04/23 1350  SpO2 99 % 02/04/23 1350  Vitals shown include unvalidated device data.  Last Pain:  Vitals:   02/04/23 1344  TempSrc: Temporal  PainSc:          Complications: No notable events documented.

## 2023-02-04 NOTE — Progress Notes (Signed)
Subjective:   Summary: Russell [name] is a 82 y.o. year old male currently admitted on the IMTS HD#1 for symptomatic anemia.  Overnight Events: Fever yesterday, Tmax 102.2.  Blood cultures drawn only x 1 (refused second draw) broad-spectrum antibiotics started.  Currently afebrile and hemodynamically stable overnight.  He feels well this morning.  He is still having some belly pain and had multiple dark stools last night.  No more syncopal or presyncopal episodes.  He has not had any nausea or emesis.  He denies any chest pain or dyspnea.  Is aware of endoscopy and possible colonoscopy later today.  Answered all questions.  Objective:  Vital signs in last 24 hours: Vitals:   02/04/23 0000 02/04/23 0100 02/04/23 0200 02/04/23 0551  BP: (!) 107/46 112/61 102/63 124/70  Pulse: 65 (!) 58 (!) 56   Resp: 17  12   Temp:    98.1 F (36.7 C)  TempSrc:    Oral  SpO2: 100% 100% 100%   Weight:    58.4 kg  Height:       Supplemental O2: Room Air SpO2: 100 %   Physical Exam:  Constitutional: Chronically ill-appearing, lying in bed under multiple blankets, NAD Cardiovascular: RRR, no murmurs, rubs or gallops Pulmonary/Chest: normal work of breathing on room air, lungs clear to auscultation bilaterally.   Abdominal: Soft, mildly distended, mild diffuse TTP, no rebound or guarding Extremities: warm, well perfused, extremity pulses 2+, no BLE pitting edema Filed Weights   02/03/23 1945 02/04/23 0551  Weight: 63.7 kg 58.4 kg     Intake/Output Summary (Last 24 hours) at 02/04/2023 0726 Last data filed at 02/04/2023 0500 Gross per 24 hour  Intake 582.1 ml  Output --  Net 582.1 ml   Net IO Since Admission: 582.1 mL [02/04/23 0726]  Pertinent Labs:    Latest Ref Rng & Units 02/03/2023    1:10 PM 01/22/2023    1:34 PM 07/22/2022    7:00 PM  CBC  WBC 4.0 - 10.5 K/uL 8.5  7.4  8.3   Hemoglobin 13.0 - 17.0 g/dL 7.1  7.2  7.8   Hematocrit 39.0 - 52.0 % 23.2  23.2  26.1    Platelets 150 - 400 K/uL 359  474  408        Latest Ref Rng & Units 02/03/2023    1:10 PM 01/22/2023    1:34 PM 07/22/2022    7:00 PM  CMP  Glucose 70 - 99 mg/dL 149  97  99   BUN 8 - 23 mg/dL 39  46  26   Creatinine 0.61 - 1.24 mg/dL 1.79  1.69  1.89   Sodium 135 - 145 mmol/L 134  132  139   Potassium 3.5 - 5.1 mmol/L 3.9  4.0  4.4   Chloride 98 - 111 mmol/L 100  100  109   CO2 22 - 32 mmol/L 20  19  19   $ Calcium 8.9 - 10.3 mg/dL 9.7  9.4  9.5   Total Protein 6.5 - 8.1 g/dL 7.3  7.0  7.2   Total Bilirubin 0.3 - 1.2 mg/dL 0.2  0.4  0.6   Alkaline Phos 38 - 126 U/L 46  40  36   AST 15 - 41 U/L 20  18  12   $ ALT 0 - 44 U/L 24  21  14    $ Reticulocyte count percent  1.7, RBC 3.4, reticulocyte count absolute 51.8, immature reticulocyte fraction 33.1 TSH 0.659 RVP negative Blood cultures x 1 pending  Imaging: DG CHEST PORT 1 VIEW  Result Date: 02/03/2023 CLINICAL DATA:  Fever. EXAM: PORTABLE CHEST 1 VIEW COMPARISON:  January 22, 2023 FINDINGS: The heart size and mediastinal contours are within normal limits. There is marked severity calcification of the aortic arch. Mild left infrahilar atelectasis and/or infiltrate is seen. There is no evidence of a pleural effusion or pneumothorax. The visualized skeletal structures are unremarkable. IMPRESSION: Mild left infrahilar atelectasis and/or infiltrate. Electronically Signed   By: Virgina Norfolk M.D.   On: 02/03/2023 20:31   CT HEAD WO CONTRAST (5MM)  Result Date: 02/03/2023 CLINICAL DATA:  Syncope EXAM: CT HEAD WITHOUT CONTRAST TECHNIQUE: Contiguous axial images were obtained from the base of the skull through the vertex without intravenous contrast. RADIATION DOSE REDUCTION: This exam was performed according to the departmental dose-optimization program which includes automated exposure control, adjustment of the mA and/or kV according to patient size and/or use of iterative reconstruction technique. COMPARISON:  CT brain 02/13/2022  FINDINGS: Brain: No acute territorial infarction, hemorrhage or intracranial mass. Atrophy. Moderate white matter hypodensity consistent with chronic small vessel ischemic changes. Stable ventricle size. Vascular: No hyperdense vessels.  Carotid vascular calcification Skull: Normal. Negative for fracture or focal lesion. Sinuses/Orbits: No acute finding. Other: None IMPRESSION: 1. No CT evidence for acute intracranial abnormality. 2. Atrophy and chronic small vessel ischemic changes of the white matter. Electronically Signed   By: Donavan Foil M.D.   On: 02/03/2023 17:16     EKG: My EKG interpretation is as follows: Initial EKG shows sinus bradycardia with PVCs, subsequently he is normal sinus rhythm with occasional PVCs on monitor  Assessment/Plan:   Principal Problem:   Symptomatic anemia Active Problems:   Type 2 diabetes mellitus with hyperglycemia, without long-term current use of insulin (HCC)   ANEMIA, IRON DEFICIENCY NOS   TOBACCO ABUSE   Anemia of renal disease   Peripheral arterial disease ([place])   Patient Summary: Russell [name] is a 82 y.o. male with PMH depression,  HTN, CHF, cerebral atrophy, COPD, seizure disorder, subdural hematomas with frequent falls, glaucoma, previously treated T2DM, CKD stage 3b, PAD, prostate cancer, remote alcohol use disorder (abstinent x 19 years), chronic IDA, GERD and diverticulosis  who presented with syncope and admitted for symptomatic anemia and syncope workup on hospital day 0   #Syncope #Sinus bradycardia with PVCs #History of frequent falls and prior subdural hematoma #Cerebral atrophy #History of seizure disorder No further episodes of syncope or presyncope.  He has remained in normal sinus rhythm since our evaluation yesterday. CT head, TSH unremarkable.  As previously mentioned, most likely etiology of his episode of syncope was symptomatic anemia and orthostatic hypotension which we are addressing as well. -symptomatic anemia  management below. -Can supplement p.o. intake with fluids if needed - Continue cardiac monitoring.  Consider echo after initial GI management   #Symptomatic anemia concerning for GI bleed #History of IDA #GERD #Diverticulosis Posttransfusion hemoglobin 6.9.  2 units ordered.  He is n.p.o. for EGD/possible colonoscopy.  reticulocyte index is hypoproliferative.  Will follow-up subsequent GI recs. - N.p.o. at midnight for EGD/colonoscopy tomorrow.  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  He  is on losartan 100 mg daily at home.  Holding at this point in the context of AKI and hypotension with GI bleed.  He is normotensive and hemodynamically stable.  No concerns for volume overload on exam.   #CKD 3 Creatinine initially was 1.79, BUN 39.  Uncertain exactly what baseline is but was approximately 1.5 earlier this year.  Unsure if he qualifies for AKI criteria but he is dehydrated on exam.  Will CTM kidney function - Trend creatinine   #History of prostate cancer with residual urinary symptoms Uncertain if patient is actually taking mirabegron, Flomax, oxybutynin.  Conflicting reports from patient and his spouse, dispense records.  If he is having signs of urinary obstruction, urgency can restart these meds.   #PAD #HLD Supposed to be on Plavix, patient not taking.  Will continue holding for now in the context of GI bleed. - Hold Plavix, continue home pravastatin 80   #Glaucoma Will continue eyedrops   #History of T2DM Last reported hemoglobin A1c was in 2016, 5.9.  He is not on any diabetes medications at home at this point.  Initial CBG was unremarkable.  Do not think we need to monitor at this time  Diet:  Clear liquid diet IVF: None VTE: None Code: Full PT/OT recs: Pending TOC  recs:  Family Update:   Dispo: Anticipated discharge pending GI management, further medical management.  Linus Galas, MD PGY-1 Internal Medicine Resident Please contact the on call pager after 5 pm and on weekends at [phone].

## 2023-02-04 NOTE — Progress Notes (Signed)
Per report from endo nurse, first unit of blood was completed, and the second unit of blood was given to patient while down in endo.  Patient returned to unit, saline locked

## 2023-02-05 DIAGNOSIS — D649 Anemia, unspecified: Secondary | ICD-10-CM | POA: Diagnosis not present

## 2023-02-05 DIAGNOSIS — G319 Degenerative disease of nervous system, unspecified: Secondary | ICD-10-CM | POA: Diagnosis not present

## 2023-02-05 DIAGNOSIS — R55 Syncope and collapse: Secondary | ICD-10-CM | POA: Diagnosis not present

## 2023-02-05 DIAGNOSIS — R001 Bradycardia, unspecified: Secondary | ICD-10-CM | POA: Diagnosis not present

## 2023-02-05 LAB — COMPREHENSIVE METABOLIC PANEL
ALT: 19 U/L (ref 0–44)
AST: 17 U/L (ref 15–41)
Albumin: 2.8 g/dL — ABNORMAL LOW (ref 3.5–5.0)
Alkaline Phosphatase: 31 U/L — ABNORMAL LOW (ref 38–126)
Anion gap: 10 (ref 5–15)
BUN: 10 mg/dL (ref 8–23)
CO2: 23 mmol/L (ref 22–32)
Calcium: 8.6 mg/dL — ABNORMAL LOW (ref 8.9–10.3)
Chloride: 105 mmol/L (ref 98–111)
Creatinine, Ser: 1.31 mg/dL — ABNORMAL HIGH (ref 0.61–1.24)
GFR, Estimated: 54 mL/min — ABNORMAL LOW (ref >60)
Glucose, Bld: 93 mg/dL (ref 70–99)
Potassium: 3.6 mmol/L (ref 3.5–5.1)
Sodium: 138 mmol/L (ref 135–145)
Total Bilirubin: 0.3 mg/dL (ref 0.3–1.2)
Total Protein: 5.3 g/dL — ABNORMAL LOW (ref 6.5–8.1)

## 2023-02-05 LAB — TYPE AND SCREEN
ABO/RH(D): O POS
Antibody Screen: NEGATIVE
Unit division: 0
Unit division: 0
Unit division: 0

## 2023-02-05 LAB — HEMOGLOBIN AND HEMATOCRIT, BLOOD
HCT: 28.3 % — ABNORMAL LOW (ref 39.0–52.0)
Hemoglobin: 9.1 g/dL — ABNORMAL LOW (ref 13.0–17.0)

## 2023-02-05 LAB — CBC WITH DIFFERENTIAL/PLATELET
Abs Immature Granulocytes: 0.02 10*3/uL (ref 0.00–0.07)
Basophils Absolute: 0 10*3/uL (ref 0.0–0.1)
Basophils Relative: 1 %
Eosinophils Absolute: 0 10*3/uL (ref 0.0–0.5)
Eosinophils Relative: 0 %
HCT: 28.1 % — ABNORMAL LOW (ref 39.0–52.0)
Hemoglobin: 9.4 g/dL — ABNORMAL LOW (ref 13.0–17.0)
Immature Granulocytes: 0 %
Lymphocytes Relative: 11 %
Lymphs Abs: 0.8 10*3/uL (ref 0.7–4.0)
MCH: 26.7 pg (ref 26.0–34.0)
MCHC: 33.5 g/dL (ref 30.0–36.0)
MCV: 79.8 fL — ABNORMAL LOW (ref 80.0–100.0)
Monocytes Absolute: 0.7 10*3/uL (ref 0.1–1.0)
Monocytes Relative: 10 %
Neutro Abs: 5.7 10*3/uL (ref 1.7–7.7)
Neutrophils Relative %: 78 %
Platelets: 244 10*3/uL (ref 150–400)
RBC: 3.52 MIL/uL — ABNORMAL LOW (ref 4.22–5.81)
RDW: 18.9 % — ABNORMAL HIGH (ref 11.5–15.5)
WBC: 7.2 10*3/uL (ref 4.0–10.5)
nRBC: 0 % (ref 0.0–0.2)

## 2023-02-05 LAB — BPAM RBC
Blood Product Expiration Date: 202403142359
Blood Product Expiration Date: 202403162359
Blood Product Expiration Date: 202403162359
ISSUE DATE / TIME: 202402141420
ISSUE DATE / TIME: 202402151015
ISSUE DATE / TIME: 202402151249
Unit Type and Rh: 5100
Unit Type and Rh: 5100
Unit Type and Rh: 5100

## 2023-02-05 LAB — GLUCOSE, CAPILLARY: Glucose-Capillary: 107 mg/dL — ABNORMAL HIGH (ref 70–99)

## 2023-02-05 MED ORDER — SODIUM CHLORIDE 0.9 % IV SOLN
1.0000 g | Freq: Once | INTRAVENOUS | Status: AC
  Administered 2023-02-05: 1 g via INTRAVENOUS
  Filled 2023-02-05: qty 10

## 2023-02-05 MED ORDER — PANTOPRAZOLE SODIUM 40 MG PO TBEC
40.0000 mg | DELAYED_RELEASE_TABLET | Freq: Two times a day (BID) | ORAL | Status: DC
  Administered 2023-02-05 – 2023-02-06 (×3): 40 mg via ORAL
  Filled 2023-02-05 (×4): qty 1

## 2023-02-05 MED ORDER — LACTATED RINGERS IV BOLUS
1000.0000 mL | Freq: Once | INTRAVENOUS | Status: AC
  Administered 2023-02-05: 1000 mL via INTRAVENOUS

## 2023-02-05 MED ORDER — SODIUM CHLORIDE 0.9 % IV SOLN: 1.0000 g | INTRAVENOUS | Status: DC

## 2023-02-05 NOTE — Progress Notes (Signed)
[facility] ROUNDING NOTE   Subjective: Patient feels well this  morning, no complaints. Would like something more than a full liquid diet.  No Bms overnight. Hgb stable after 2 units pRBCs yesterday.  BUN improved from 39 to 10 today.   Objective: Vital signs in last 24 hours: Temp:  [98 F (36.7 C)-99.5 F (37.5 C)] 99 F (37.2 C) (02/16 0732) Pulse Rate:  [52-63] 63 (02/16 0834) Resp:  [14-23] 16 (02/16 0834) BP: (100-127)/(45-75) 109/45 (02/16 0732) SpO2:  [99 %-100 %] 99 % (02/16 0732) Weight:  [58.4 kg-60.7 kg] 60.7 kg (02/16 0000) Last BM Date : 02/05/23 General: NAD, thin Af Am male Lungs:  CTA b/l, no w/r/r Heart:  RRR, no m/r/g Abdomen:  Soft, NT, ND, +BS Ext:  No c/c/e    Intake/Output from previous day: 02/15 0701 - 02/16 0700 In: 615 [I.V.:300; Blood:315] Out: 450 [Urine:450] Intake/Output this shift: Total I/O In: 480 [P.O.:480] Out: -    Lab Results: Recent Labs    02/03/23 1310 02/04/23 0702 02/04/23 1017 02/04/23 1708 02/05/23 0644  WBC 8.5  --   --   --  7.2  HGB 7.1*   < > 6.6* 9.9* 9.4*  PLT 359  --   --   --  244  MCV 78.1*  --   --   --  79.8*   < > = values in this interval not displayed.   BMET Recent Labs    02/03/23 1310 02/05/23 0644  NA 134* 138  K 3.9 3.6  CL 100 105  CO2 20* 23  GLUCOSE 149* 93  BUN 39* 10  CREATININE 1.79* 1.31*  CALCIUM 9.7 8.6*   LFT Recent Labs    02/03/23 1310 02/05/23 0644  PROT 7.3 5.3*  ALBUMIN 4.2 2.8*  AST 20 17  ALT 24 19  ALKPHOS 46 31*  BILITOT 0.2* 0.3   PT/INR Recent Labs    02/03/23 1830  INR 1.1      Imaging/Other results: DG CHEST PORT 1 VIEW  Result Date: 02/03/2023 CLINICAL DATA:  Fever. EXAM: PORTABLE CHEST 1 VIEW COMPARISON:  January 22, 2023 FINDINGS: The heart size and mediastinal contours are within normal limits. There is marked severity calcification of the aortic arch. Mild left infrahilar atelectasis and/or infiltrate is seen. There is no  evidence of a pleural effusion or pneumothorax. The visualized skeletal structures are unremarkable. IMPRESSION: Mild left infrahilar atelectasis and/or infiltrate. Electronically Signed   By: Virgina Norfolk M.D.   On: 02/03/2023 20:31   CT HEAD WO CONTRAST (5MM)  Result Date: 02/03/2023 CLINICAL DATA:  Syncope EXAM: CT HEAD WITHOUT CONTRAST TECHNIQUE: Contiguous axial images were obtained from the base of the skull through the vertex without intravenous contrast. RADIATION DOSE REDUCTION: This exam was performed according to the departmental dose-optimization program which includes automated exposure control, adjustment of the mA and/or kV according to patient size and/or use of iterative reconstruction technique. COMPARISON:  CT brain 02/13/2022 FINDINGS: Brain: No acute territorial infarction, hemorrhage or intracranial mass. Atrophy. Moderate white matter hypodensity consistent with chronic small vessel ischemic changes. Stable ventricle size. Vascular: No hyperdense vessels.  Carotid vascular calcification Skull: Normal. Negative for fracture or focal lesion. Sinuses/Orbits: No acute finding. Other: None IMPRESSION: 1. No CT evidence for acute intracranial abnormality. 2. Atrophy and chronic small vessel ischemic changes of the white matter. Electronically Signed   By: Donavan Foil M.D.   On: 02/03/2023 17:16      Assessment and Plan:  82 year old male with a history of COPD, CKD, PAD, DM admitted with iron deficiency anemia following a syncopal event with hgb 7 and BUN 39.  An EGD and colonoscopy were planned for Feb 15th, but patient's prep was inadequate, so only the EGD was performed. 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 He received 2 units pRBCs yesterday for a hgb 6.6 and his hgb is 9.4 this morning with a BUN 10. He denied bowel movements to me, but apparently reported having melenic stools to the hospitalist team.   His labs and vitals suggest  bleeding has subsided, but agree with observation through the day/tomorrow to ensure stable hgb.  Upper GI bleed secondary to gastric/duodenal AVMs s/p APC cauterization - Ok to transition PPI to PO BID.  Recommend BID PPI therapy for 6 weeks to allow APC sites to heal - Will arrange outpatient follow up to ensure hemoglobin stability.  If further decline in hemoglobin, will proceed with colonoscopy.  GI will follow peripherally until discharge   Daryel November, MD  02/05/2023, 8:52 AM [facility]

## 2023-02-05 NOTE — TOC Progression Note (Signed)
Transition of Care [facility]) - Progression Note    Patient Details  Name: Russell [name] MRN: [id number] Date of Birth: Aug 08, 1941  [facility] CM/SW Contact  Zenon Mayo, RN Phone Number: 02/05/2023, 4:05 PM  Clinical Narrative:    from home with wife, syncope, having GI issues, EGD yesterday.  TOC following.        Expected Discharge Plan and Services                                               Social Determinants of Health (SDOH) Interventions SDOH Screenings   Tobacco Use: High Risk (02/04/2023)    Readmission Risk Interventions     No data to display

## 2023-02-05 NOTE — Care Management Important Message (Signed)
Important Message  Patient Details  Name: Russell [name] MRN: [id number] Date of Birth: 1941/11/06   Medicare Important Message Given:  Yes     Hannah Beat 02/05/2023, 11:58 AM

## 2023-02-05 NOTE — Progress Notes (Addendum)
Subjective:   Summary: Russell [name] is a 82 y.o. year old male currently admitted on the IMTS HD#2 for symptomatic anemia.  Overnight Events: posttransfusion H&H stable. Slightly hypotensive and bradycardic overnight. 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 Other recs below. Colonoscopy not done yesterday because of incomplete prep  He has had more melanotic stools overnight, unsure how many.  He endorses continued chills and some abdominal pain increased urinary frequency but no dysuria, hematuria, chest pain, dyspnea, flank or back pain, nausea or emesis.  He has not had any bright red stools.  Went over results of EGD yesterday and necessity for further observation with continued melanotic stools.  Objective:  Vital signs in last 24 hours: Vitals:   02/04/23 1626 02/04/23 2000 02/05/23 0000 02/05/23 0410  BP: (!) 113/58 (!) 100/48 112/66 (!) 103/47  Pulse:  (!) 59 (!) 54 (!) 54  Resp:  18 18 18  $ Temp: 98.7 F (37.1 C) 99.4 F (37.4 C) 99.5 F (37.5 C) 99.4 F (37.4 C)  TempSrc: Oral Oral Oral Oral  SpO2:  100% 100% 100%  Weight:   60.7 kg   Height:       Supplemental O2: Room Air SpO2: 100 %   Physical Exam:  Constitutional: Chronically ill-appearing, lying in bed under multiple blankets, NAD Cardiovascular: RRR, no murmurs, rubs or gallops Pulmonary/Chest: normal work of breathing on room air, lungs clear to auscultation bilaterally.   Abdominal: Soft, mildly distended, TTP suprapubic region, epigastric region, no rebound or guarding Extremities: warm, well perfused, extremity pulses 2+, no BLE pitting edema Filed Weights   02/04/23 0551 02/04/23 1235 02/05/23 0000  Weight: 58.4 kg 58.4 kg 60.7 kg     Intake/Output Summary (Last 24 hours) at 02/05/2023 0635 Last data filed at 02/05/2023 0010 Gross per 24 hour  Intake 615 ml  Output 450 ml  Net 165 ml    Net IO Since  Admission: 747.1 mL [02/05/23 0635]  Pertinent Labs:    Latest Ref Rng & Units 02/04/2023    5:08 PM 02/04/2023   10:17 AM 02/04/2023    7:02 AM  CBC  Hemoglobin 13.0 - 17.0 g/dL 9.9  6.6  6.9   Hematocrit 39.0 - 52.0 % 30.1  21.4         Latest Ref Rng & Units 02/03/2023    1:10 PM 01/22/2023    1:34 PM 07/22/2022    7:00 PM  CMP  Glucose 70 - 99 mg/dL 149  97  99   BUN 8 - 23 mg/dL 39  46  26   Creatinine 0.61 - 1.24 mg/dL 1.79  1.69  1.89   Sodium 135 - 145 mmol/L 134  132  139   Potassium 3.5 - 5.1 mmol/L 3.9  4.0  4.4   Chloride 98 - 111 mmol/L 100  100  109   CO2 22 - 32 mmol/L 20  19  19   $ Calcium 8.9 - 10.3 mg/dL 9.7  9.4  9.5   Total Protein 6.5 - 8.1 g/dL 7.3  7.0  7.2   Total Bilirubin 0.3 - 1.2 mg/dL 0.2  0.4  0.6   Alkaline Phos 38 - 126 U/L 46  40  36   AST 15 - 41 U/L 20  18  12   $ ALT 0 - 44 U/L 24  21  14    Reticulocyte count percent 1.7, RBC 3.4, reticulocyte count absolute 51.8, immature reticulocyte fraction 33.1 TSH 0.659 RVP negative Blood cultures x 1 negative preliminarily  Imaging: No results found.   EKG:   Assessment/Plan:   Patient Summary: Russell [name] is a 82 y.o. male with PMH depression,  HTN, CHF, cerebral atrophy, COPD, seizure disorder, subdural hematomas with frequent falls, glaucoma, previously treated T2DM, CKD stage 3b, PAD, prostate cancer, remote alcohol use disorder (abstinent x 19 years), chronic IDA, GERD and diverticulosis  who presented with syncope and admitted for symptomatic anemia and syncope workup on hospital day 0   #Syncope #Sinus bradycardia with PVCs #History of frequent falls and prior subdural hematoma #Cerebral atrophy #History of seizure disorder No further episodes of syncope or presyncope.  He is occasionally bradycardic but remains in sinus rhythm.  As previously mentioned, most likely etiology of his episode of syncope was symptomatic anemia and orthostatic hypotension which we are addressing as  well. -symptomatic anemia management below. -Can supplement p.o. intake with fluids if needed - Continue cardiac monitoring.  Consider echo after initial GI management   #Symptomatic anemia concerning for GI bleed #History of IDA #GERD #Diverticulosis Posttransfusion hemoglobin 9.9, CBC this morning 9.4.  Endoscopy yesterday revealed AVMs in stomach and duodenum, colonoscopy deferred because of incomplete prep.  GI recs below.  Continues to have melanotic stools, will CTM and trend hemoglobin and reach back out to GI for further intervention if he has decompensation or any indication for colonoscopy or repeat EGD during the admission. - initial FLD, protonix 40m BID for 6 weeks - Follow-up H&H this afternoon, trend hemoglobin and transfuse<7 - Zofran 4 every 8 hours as needed for nausea - IV iron after initial GI management   #Fever Febrile 2 days ago with Tmax 102, broad spectrum abx started.  He only gave 1 blood culture sample which is preliminarily negative.  Did not give urine sample.  Chest x-ray at that time was unremarkable.  He continues to have suprapubic tenderness and urinary frequency so difficult to tell given lack of urine specimen but less likely source is urinary.  Will narrow antibiotics today to ceftriaxone to complete 3-day course for UTI.  If he declares fever tomorrow after completing course, will reassess. - Ceftriaxone today to complete 3-day course for UTI.  Will DC Vanco and cefepime.  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  He is on losartan 100 mg daily at home.  Holding at this point in the context of AKI and hypotension with GI bleed.  He is normotensive and hemodynamically stable.  No concerns for volume overload on exam.   #CKD 3 Creatinine initially was 1.79, BUN 39.  Uncertain exactly what baseline is but was approximately 1.5 earlier this year.  CR 1.3 today, likely at baseline - Trend creatinine   #History of prostate cancer with residual urinary  symptoms Uncertain if patient is actually taking mirabegron, Flomax, oxybutynin.  Conflicting reports from patient and his spouse, dispense records.  If he is having signs of urinary obstruction, urgency can restart these meds.   #PAD #HLD Supposed to be on Plavix, patient not taking.  Will continue holding for now in the context of GI bleed. - Hold Plavix, continue home pravastatin 80   #Glaucoma Will continue eyedrops   #History of T2DM Last reported hemoglobin A1c was in 2016, 5.9.  He is not on any diabetes medications at home at this point.  Initial CBG was  unremarkable.  Do not think we need to monitor at this time  Diet:  Clear liquid diet IVF: None VTE: SCD Code: Full PT/OT recs: Pending TOC recs:  Family Update:   Dispo: Anticipated discharge pending GI management, further medical management.  Linus Galas, MD PGY-1 Internal Medicine Resident Please contact the on call pager after 5 pm and on weekends at [phone].

## 2023-02-06 DIAGNOSIS — D649 Anemia, unspecified: Secondary | ICD-10-CM | POA: Diagnosis not present

## 2023-02-06 LAB — GLUCOSE, CAPILLARY: Glucose-Capillary: 93 mg/dL (ref 70–99)

## 2023-02-06 MED ORDER — SODIUM CHLORIDE 0.9 % IV SOLN
510.0000 mg | Freq: Once | INTRAVENOUS | Status: AC
  Administered 2023-02-06: 510 mg via INTRAVENOUS
  Filled 2023-02-06: qty 17

## 2023-02-06 MED ORDER — PANTOPRAZOLE SODIUM 40 MG PO TBEC: 40.0000 mg | DELAYED_RELEASE_TABLET | Freq: Two times a day (BID) | ORAL | 0 refills | Status: DC

## 2023-02-06 NOTE — Progress Notes (Signed)
PT Cancellation Note  Patient Details Name: Russell [name] MRN: [id number] DOB: Sep 26, 1941   Cancelled Treatment:    Reason Eval/Treat Not Completed: Other (comment).  Declined very adamantly over his IV in R hand and legs are hurting.  Stated he can walk fine when he is ready, not interested in PT.  Will retry as time and pt allow.   Ramond Dial 02/06/2023, 10:43 AM  Mee Hives, PT PhD Acute Rehab Dept. Number: [place] and [place]

## 2023-02-06 NOTE — TOC Transition Note (Signed)
Transition of Care [facility]) - CM/SW Discharge Note   Patient Details  Name: Russell [name] MRN: [id number] Date of Birth: 1941/09/05  Transition of Care [facility]) CM/SW Contact:  Tom-Johnson, Renea Ee, RN Phone Number: 02/06/2023, 2:21 PM   Clinical Narrative:     Patient is scheduled for discharge today. Outpatient f/u and instructions on AVS. No TOC needs or recommendations noted. Family to transport at discharge. No further TOC needs noted.            Final next level of care: Home/Self Care Barriers to Discharge: Barriers Resolved   Patient Goals and CMS Choice CMS Medicare.gov Compare Post Acute Care list provided to:: Patient Choice offered to / list presented to : NA  Discharge Placement                  Patient to be transferred to facility by: Family      Discharge Plan and Services Additional resources added to the After Visit Summary for                  DME Arranged: N/A DME Agency: NA       HH Arranged: NA HH Agency: NA        Social Determinants of Health (SDOH) Interventions SDOH Screenings   Tobacco Use: High Risk (02/04/2023)     Readmission Risk Interventions     No data to display

## 2023-02-06 NOTE — Discharge Summary (Addendum)
Name: Russell [name] MRN: [id number] DOB: 20-Jan-1941 82 y.o. PCP: Marcie Mowers, FNP  Date of Admission: 02/03/2023 12:44 PM Date of Discharge: 02/06/2023 Attending Physician: Sid Falcon, MD  Discharge Diagnosis: 1. Principal Problem:   Symptomatic anemia Active Problems:   Type 2 diabetes mellitus with hyperglycemia, without long-term current use of insulin (HCC)   ANEMIA, IRON DEFICIENCY NOS   TOBACCO ABUSE   Anemia of renal disease   Peripheral arterial disease (HCC)   Syncope and collapse   AVM (arteriovenous malformation) of stomach, acquired with hemorrhage   Gastrointestinal hemorrhage    Discharge Medications: Allergies as of 02/06/2023       Reactions   Ultram [tramadol] Other (See Comments)   Confusion         Medication List     STOP taking these medications    clopidogrel 75 MG tablet Commonly known as: PLAVIX   esomeprazole 20 MG capsule Commonly known as: NEXIUM   FeroSul 325 (65 FE) MG tablet Generic drug: ferrous sulfate   meclizine 25 MG tablet Commonly known as: ANTIVERT       TAKE these medications    Alphagan P 0.1 % Soln Generic drug: brimonidine Place 1 drop into the right eye 3 (three) times daily.   bimatoprost 0.01 % Soln Commonly known as: LUMIGAN Place 1 drop into both eyes at bedtime.   multivitamin with minerals Tabs tablet Take 1 tablet by mouth daily.   Myrbetriq 25 MG Tb24 tablet Generic drug: mirabegron ER Take 25 mg by mouth daily.   oxybutynin 10 MG 24 hr tablet Commonly known as: DITROPAN-XL Take 10 mg by mouth daily.   oxyCODONE-acetaminophen 10-325 MG tablet Commonly known as: PERCOCET Take 1 tablet by mouth every 8 (eight) hours as needed for pain.   pantoprazole 40 MG tablet Commonly known as: PROTONIX Take 1 tablet (40 mg total) by mouth 2 (two) times daily before a meal. What changed: when to take this   pravastatin 80 MG tablet Commonly known as: PRAVACHOL Take 80 mg by mouth  daily.   PRESCRIPTION MEDICATION Take 1 tablet by mouth at bedtime. Cancer medication.   Simbrinza 1-0.2 % Susp Generic drug: Brinzolamide-Brimonidine Place 1 drop into both eyes 3 (three) times daily.   tamsulosin 0.4 MG Caps capsule Commonly known as: FLOMAX Take 0.4 mg by mouth daily.        Disposition and follow-up:   Mr.Russell [name] was discharged from [facility] in [place] condition.  At the hospital follow up visit please address:  1.  Symptomatic Anemia: Found to have UGIB 2/2 gastric/duodenal AVMs s/p APC cauterization. Discharged on PO PPI BID x6 weeks per GI. Hgb 9.1 at discharge. [facility] to arrange follow up. Patient to call PCP to schedule hospital f/u visit in 1-2 weeks. Ensure hgb remains stable prior to restarting plavix (defer to PCP).  2. UTI (resolved): finished 3 days of antibiotics. Had 1/2 bottles growing GPRs but these were likely contaminants given he remained afebrile and without leukocytosis.  2.  Labs / imaging needed at time of follow-up: CBC (in 2 weeks, GI to arrange), repeat iron/ferritin levels in 1 month  3.  Pending labs/ test needing follow-up: none  Follow-up Appointments:  Follow-up [facility]. Call in 2 week(s).   Specialty: Gastroenterology Why: Call phone number provided to schedule appointment with GI doctor in 2 weeks for hospital follow up. Contact information: [address]  [place] [phone]        Marcie Mowers, FNP. Schedule an appointment as soon as possible for a visit in 1 week(s).   Specialty: Family Medicine Why: Make an appointment in 1-2 weeks with your primary care doctor. Contact information: [address] [address] [place] Hospital Course by problem list:  Symptomatic Anemia 2/2 acute blood loss anemia UGIB 2/2 gastric/duodenal AVMs s/p APC cauterization Hx of  Diverticulosis Patient presented following a syncopal event with Hgb 7 and BUN 39. He was admitted due to concern for GI bleed. He had a history of diverticulosis and prior iron studies showing IDA. EGD revealed AVMs in stomach and duodenum which were cauterized by GI. He was unable to undergo colonoscopy due to incomplete prep and inability to perform adequate prep per GI. He received a total 3u pRBCs while here and hemoglobin ultimately stabilized around 9.1. Given IDA, have provided 1 feraheme infusion for repletion, but he will need further supplementation which he will discuss with PCP. He is HDS at discharge and GI will arrange outpatient follow up for patient. They will also arrange repeat CBC check in 2 weeks to ensure stability. I have prescribed PO protonix BID x6 weeks as recommended by GI. He will contact his PCP to arrange hospital follow up in 1-2 weeks (also discussed with spouse, [name]).  Syncope Sinus bradycardia with PVCs No further episodes of syncope or presyncope since admission and blood transfusion. This was likely precipitated by symptomatic anemia which is now resolved. He has intermittently become bradycardic to the 50s, but is asymptomatic during these periods. Low suspicion that syncopal episode was from bradycardia.   PAD Patient was not taking home plavix. Held while here given UGIB. Will defer to PCP as to when to restart. Would recommend checking CBC as an outpatient to ensure continued stability.   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 Cr improved to 1.31 while here with IVF rehydration. Baseline is likely at CKD IIIA.   Discharge Subjective: He feels fine, IV lines are causing some mild pain. He would like to go home today and feels much better than when he came in. Discussed need for protonix for 6 weeks. Spoke with spouse, [name], and updated her on discharge plans. They will contact GI office to schedule  hospital follow up and PCP to schedule hospital follow up as well.  Discharge Exam:   BP 113/65 (BP Location: Left Arm)   Pulse (!) 50   Temp 98.5 F (36.9 C) (Oral)   Resp 18   Ht 6' 2"$  (1.88 m)   Wt 62.7 kg   SpO2 100%   BMI 17.75 kg/m  Discharge exam:  Physical Exam Constitutional:      Appearance: Normal appearance. He is not ill-appearing.     Comments: Thin elderly male  HENT:     Mouth/Throat:     Mouth: Mucous membranes are moist.     Pharynx: Oropharynx is clear.  Eyes:     Extraocular Movements: Extraocular movements intact.     Conjunctiva/sclera: Conjunctivae normal.     Pupils: Pupils are equal, round, and reactive to light.  Cardiovascular:     Rate and Rhythm: Normal rate and regular rhythm.     Heart sounds: Normal heart sounds. No murmur heard.    No gallop.  Pulmonary:  Effort: Pulmonary effort is normal.     Breath sounds: Normal breath sounds. No wheezing, rhonchi or rales.  Abdominal:     General: Bowel sounds are normal. There is no distension.     Palpations: Abdomen is soft.     Tenderness: There is no abdominal tenderness. There is no guarding or rebound.  Musculoskeletal:        General: No swelling. Normal range of motion.  Skin:    General: Skin is warm and dry.  Neurological:     Mental Status: He is alert and oriented to person, place, and time. Mental status is at baseline.  Psychiatric:        Mood and Affect: Mood normal.        Behavior: Behavior normal.      Pertinent Labs, Studies, and Procedures:     Latest Ref Rng & Units 02/05/2023    3:56 PM 02/05/2023    6:44 AM 02/04/2023    5:08 PM  CBC  WBC 4.0 - 10.5 K/uL  7.2    Hemoglobin 13.0 - 17.0 g/dL 9.1  9.4  9.9   Hematocrit 39.0 - 52.0 % 28.3  28.1  30.1   Platelets 150 - 400 K/uL  244        Latest Ref Rng & Units 02/05/2023    6:44 AM 02/03/2023    1:10 PM 01/22/2023    1:34 PM  CMP  Glucose 70 - 99 mg/dL 93  149  97   BUN 8 - 23 mg/dL 10  39  46   Creatinine  0.61 - 1.24 mg/dL 1.31  1.79  1.69   Sodium 135 - 145 mmol/L 138  134  132   Potassium 3.5 - 5.1 mmol/L 3.6  3.9  4.0   Chloride 98 - 111 mmol/L 105  100  100   CO2 22 - 32 mmol/L 23  20  19   $ Calcium 8.9 - 10.3 mg/dL 8.6  9.7  9.4   Total Protein 6.5 - 8.1 g/dL 5.3  7.3  7.0   Total Bilirubin 0.3 - 1.2 mg/dL 0.3  0.2  0.4   Alkaline Phos 38 - 126 U/L 31  46  40   AST 15 - 41 U/L 17  20  18   $ ALT 0 - 44 U/L 19  24  21    $ DG CHEST PORT 1 VIEW  Result Date: 02/03/2023 CLINICAL DATA:  Fever. EXAM: PORTABLE CHEST 1 VIEW COMPARISON:  January 22, 2023 FINDINGS: The heart size and mediastinal contours are within normal limits. There is marked severity calcification of the aortic arch. Mild left infrahilar atelectasis and/or infiltrate is seen. There is no evidence of a pleural effusion or pneumothorax. The visualized skeletal structures are unremarkable. IMPRESSION: Mild left infrahilar atelectasis and/or infiltrate. Electronically Signed   By: Virgina Norfolk M.D.   On: 02/03/2023 20:31   CT HEAD WO CONTRAST (5MM)  Result Date: 02/03/2023 CLINICAL DATA:  Syncope EXAM: CT HEAD WITHOUT CONTRAST TECHNIQUE: Contiguous axial images were obtained from the base of the skull through the vertex without intravenous contrast. RADIATION DOSE REDUCTION: This exam was performed according to the departmental dose-optimization program which includes automated exposure control, adjustment of the mA and/or kV according to patient size and/or use of iterative reconstruction technique. COMPARISON:  CT brain 02/13/2022 FINDINGS: Brain: No acute territorial infarction, hemorrhage or intracranial mass. Atrophy. Moderate white matter hypodensity consistent with chronic small vessel ischemic changes. Stable ventricle size. Vascular: No hyperdense  vessels.  Carotid vascular calcification Skull: Normal. Negative for fracture or focal lesion. Sinuses/Orbits: No acute finding. Other: None IMPRESSION: 1. No CT evidence for acute  intracranial abnormality. 2. Atrophy and chronic small vessel ischemic changes of the white matter. Electronically Signed   By: Donavan Foil M.D.   On: 02/03/2023 17:16     Discharge Instructions: Discharge Instructions     Diet - low sodium heart healthy   Complete by: As directed    Discharge instructions   Complete by: As directed    Mr. Lyden, you came in with a stomach bleed. You underwent endoscopy where they found a couple of blood vessels that may have caused your bleed and these were treated. You also received some iron while you were here because your iron stores are low.  Please take note of the following: 1. I have prescribed a medicine called Protonix (aka Pantoprazole) which you need to take twice a day for 6 weeks. I have sent this to the [facility] on [address].  2. Please call the phone number listed to schedule a visit with the stomach doctor ([facility]) in 2 weeks for hospital follow up.  3. Please make an appointment with your primary care doctor in 1-2 weeks for hospital follow up.  4. Do not take your plavix until you follow up with your primary care doctor and they say it is safe to restart it.   Increase activity slowly   Complete by: As directed        Signed: Virl Axe, MD 02/06/2023, 12:05 PM   Pager: [phone]

## 2023-02-07 ENCOUNTER — Encounter (HOSPITAL_COMMUNITY): Payer: Self-pay | Admitting: Gastroenterology

## 2023-02-08 ENCOUNTER — Other Ambulatory Visit: Payer: Self-pay

## 2023-02-08 ENCOUNTER — Telehealth: Payer: Self-pay

## 2023-02-08 DIAGNOSIS — D500 Iron deficiency anemia secondary to blood loss (chronic): Secondary | ICD-10-CM

## 2023-02-08 LAB — CULTURE, BLOOD (ROUTINE X 2): Special Requests: ADEQUATE

## 2023-02-08 NOTE — Telephone Encounter (Signed)
Order in for CBC and reminder in epic.

## 2023-02-08 NOTE — Telephone Encounter (Signed)
-----   Message from Daryel November, MD sent at 02/06/2023  7:34 AM EST ----- Regarding: Repeat CBC in 2 weeks Vaughan Basta,  Can you arrange to repeat a CBC on this patient in 2 weeks?  Thanks

## 2023-02-23 ENCOUNTER — Ambulatory Visit: Payer: 59 | Admitting: Nurse Practitioner

## 2023-02-27 ENCOUNTER — Other Ambulatory Visit: Payer: Self-pay | Admitting: Vascular Surgery

## 2023-03-02 ENCOUNTER — Encounter: Payer: Self-pay | Admitting: Podiatry

## 2023-03-02 ENCOUNTER — Ambulatory Visit (INDEPENDENT_AMBULATORY_CARE_PROVIDER_SITE_OTHER): Payer: 59 | Admitting: Podiatry

## 2023-03-02 ENCOUNTER — Other Ambulatory Visit: Payer: Self-pay | Admitting: Podiatry

## 2023-03-02 DIAGNOSIS — B351 Tinea unguium: Secondary | ICD-10-CM

## 2023-03-02 DIAGNOSIS — G5782 Other specified mononeuropathies of left lower limb: Secondary | ICD-10-CM | POA: Diagnosis not present

## 2023-03-02 DIAGNOSIS — M79676 Pain in unspecified toe(s): Secondary | ICD-10-CM

## 2023-03-02 MED ORDER — OXYCODONE-ACETAMINOPHEN 10-325 MG PO TABS: 1.0000 | ORAL_TABLET | Freq: Three times a day (TID) | ORAL | 0 refills | Status: AC | PRN

## 2023-03-02 NOTE — Progress Notes (Signed)
Presents today for follow-up of his neuroma third digit left foot.  States he also would like to have his nails cut.  States that he was in the hospital recently and is going to have to undergo surgery for something.  He does not recall what it is for he states that is not his heart.  Objective: Vital signs are stable he is alert and oriented x 3 still has severely painful neuroma or stump neuroma third interdigital space of the left foot.  Toenails are long thick yellow dystrophic onychomycotic no open lesions or wounds are noted.  Assessment: Pain limb secondary to onychomycosis and stump neuroma third interspace left foot.  Plan: Debrided toenails 1 through 5 bilaterally injected 2.0 cc of 4% dehydrated alcohol solution to the third interdigital space of the left foot.  Also sent in Percocet for his chronic pain.

## 2023-03-03 ENCOUNTER — Inpatient Hospital Stay (HOSPITAL_COMMUNITY)
Admission: EM | Admit: 2023-03-03 | Discharge: 2023-03-07 | DRG: 377 | Disposition: A | Discharge status: Home / Self Care | Payer: 59 | Attending: Internal Medicine | Admitting: Internal Medicine

## 2023-03-03 ENCOUNTER — Emergency Department (HOSPITAL_COMMUNITY): Payer: 59

## 2023-03-03 DIAGNOSIS — N1832 Chronic kidney disease, stage 3b: Secondary | ICD-10-CM | POA: Diagnosis present

## 2023-03-03 DIAGNOSIS — G8929 Other chronic pain: Secondary | ICD-10-CM | POA: Diagnosis present

## 2023-03-03 DIAGNOSIS — N401 Enlarged prostate with lower urinary tract symptoms: Secondary | ICD-10-CM | POA: Diagnosis present

## 2023-03-03 DIAGNOSIS — E782 Mixed hyperlipidemia: Secondary | ICD-10-CM | POA: Diagnosis present

## 2023-03-03 DIAGNOSIS — K3189 Other diseases of stomach and duodenum: Secondary | ICD-10-CM | POA: Diagnosis present

## 2023-03-03 DIAGNOSIS — D127 Benign neoplasm of rectosigmoid junction: Secondary | ICD-10-CM | POA: Diagnosis not present

## 2023-03-03 DIAGNOSIS — I509 Heart failure, unspecified: Secondary | ICD-10-CM | POA: Diagnosis present

## 2023-03-03 DIAGNOSIS — E43 Unspecified severe protein-calorie malnutrition: Secondary | ICD-10-CM | POA: Diagnosis present

## 2023-03-03 DIAGNOSIS — I739 Peripheral vascular disease, unspecified: Secondary | ICD-10-CM | POA: Diagnosis present

## 2023-03-03 DIAGNOSIS — N390 Urinary tract infection, site not specified: Secondary | ICD-10-CM | POA: Diagnosis not present

## 2023-03-03 DIAGNOSIS — Z981 Arthrodesis status: Secondary | ICD-10-CM | POA: Diagnosis not present

## 2023-03-03 DIAGNOSIS — E1122 Type 2 diabetes mellitus with diabetic chronic kidney disease: Secondary | ICD-10-CM | POA: Diagnosis present

## 2023-03-03 DIAGNOSIS — D62 Acute posthemorrhagic anemia: Secondary | ICD-10-CM | POA: Diagnosis not present

## 2023-03-03 DIAGNOSIS — F17200 Nicotine dependence, unspecified, uncomplicated: Secondary | ICD-10-CM | POA: Diagnosis not present

## 2023-03-03 DIAGNOSIS — I2510 Atherosclerotic heart disease of native coronary artery without angina pectoris: Secondary | ICD-10-CM | POA: Diagnosis present

## 2023-03-03 DIAGNOSIS — E1151 Type 2 diabetes mellitus with diabetic peripheral angiopathy without gangrene: Secondary | ICD-10-CM | POA: Diagnosis present

## 2023-03-03 DIAGNOSIS — K449 Diaphragmatic hernia without obstruction or gangrene: Secondary | ICD-10-CM | POA: Diagnosis present

## 2023-03-03 DIAGNOSIS — I130 Hypertensive heart and chronic kidney disease with heart failure and stage 1 through stage 4 chronic kidney disease, or unspecified chronic kidney disease: Secondary | ICD-10-CM | POA: Diagnosis present

## 2023-03-03 DIAGNOSIS — Z79899 Other long term (current) drug therapy: Secondary | ICD-10-CM

## 2023-03-03 DIAGNOSIS — K635 Polyp of colon: Secondary | ICD-10-CM | POA: Diagnosis not present

## 2023-03-03 DIAGNOSIS — N3090 Cystitis, unspecified without hematuria: Secondary | ICD-10-CM | POA: Diagnosis not present

## 2023-03-03 DIAGNOSIS — I10 Essential (primary) hypertension: Secondary | ICD-10-CM | POA: Diagnosis not present

## 2023-03-03 DIAGNOSIS — R339 Retention of urine, unspecified: Secondary | ICD-10-CM

## 2023-03-03 DIAGNOSIS — K219 Gastro-esophageal reflux disease without esophagitis: Secondary | ICD-10-CM | POA: Diagnosis present

## 2023-03-03 DIAGNOSIS — R64 Cachexia: Secondary | ICD-10-CM | POA: Diagnosis present

## 2023-03-03 DIAGNOSIS — K641 Second degree hemorrhoids: Secondary | ICD-10-CM | POA: Diagnosis present

## 2023-03-03 DIAGNOSIS — K31811 Angiodysplasia of stomach and duodenum with bleeding: Secondary | ICD-10-CM | POA: Diagnosis present

## 2023-03-03 DIAGNOSIS — K921 Melena: Principal | ICD-10-CM

## 2023-03-03 DIAGNOSIS — J449 Chronic obstructive pulmonary disease, unspecified: Secondary | ICD-10-CM | POA: Diagnosis present

## 2023-03-03 DIAGNOSIS — Z8546 Personal history of malignant neoplasm of prostate: Secondary | ICD-10-CM

## 2023-03-03 DIAGNOSIS — D122 Benign neoplasm of ascending colon: Secondary | ICD-10-CM | POA: Diagnosis not present

## 2023-03-03 DIAGNOSIS — K5520 Angiodysplasia of colon without hemorrhage: Secondary | ICD-10-CM | POA: Diagnosis not present

## 2023-03-03 DIAGNOSIS — N183 Chronic kidney disease, stage 3 unspecified: Secondary | ICD-10-CM | POA: Diagnosis present

## 2023-03-03 DIAGNOSIS — N39498 Other specified urinary incontinence: Secondary | ICD-10-CM | POA: Diagnosis present

## 2023-03-03 DIAGNOSIS — Z8249 Family history of ischemic heart disease and other diseases of the circulatory system: Secondary | ICD-10-CM

## 2023-03-03 DIAGNOSIS — K649 Unspecified hemorrhoids: Secondary | ICD-10-CM | POA: Diagnosis not present

## 2023-03-03 DIAGNOSIS — D649 Anemia, unspecified: Secondary | ICD-10-CM | POA: Diagnosis present

## 2023-03-03 DIAGNOSIS — K922 Gastrointestinal hemorrhage, unspecified: Secondary | ICD-10-CM | POA: Diagnosis not present

## 2023-03-03 DIAGNOSIS — N3091 Cystitis, unspecified with hematuria: Secondary | ICD-10-CM | POA: Diagnosis present

## 2023-03-03 DIAGNOSIS — D126 Benign neoplasm of colon, unspecified: Secondary | ICD-10-CM | POA: Diagnosis not present

## 2023-03-03 DIAGNOSIS — Z681 Body mass index (BMI) 19 or less, adult: Secondary | ICD-10-CM | POA: Diagnosis not present

## 2023-03-03 DIAGNOSIS — D125 Benign neoplasm of sigmoid colon: Secondary | ICD-10-CM | POA: Diagnosis present

## 2023-03-03 DIAGNOSIS — F17210 Nicotine dependence, cigarettes, uncomplicated: Secondary | ICD-10-CM | POA: Diagnosis present

## 2023-03-03 DIAGNOSIS — Z833 Family history of diabetes mellitus: Secondary | ICD-10-CM | POA: Diagnosis not present

## 2023-03-03 DIAGNOSIS — I700 Atherosclerosis of aorta: Secondary | ICD-10-CM | POA: Diagnosis present

## 2023-03-03 DIAGNOSIS — Z7902 Long term (current) use of antithrombotics/antiplatelets: Secondary | ICD-10-CM

## 2023-03-03 DIAGNOSIS — R338 Other retention of urine: Secondary | ICD-10-CM | POA: Diagnosis present

## 2023-03-03 DIAGNOSIS — G40909 Epilepsy, unspecified, not intractable, without status epilepticus: Secondary | ICD-10-CM | POA: Diagnosis present

## 2023-03-03 DIAGNOSIS — K5730 Diverticulosis of large intestine without perforation or abscess without bleeding: Secondary | ICD-10-CM | POA: Diagnosis not present

## 2023-03-03 DIAGNOSIS — D630 Anemia in neoplastic disease: Secondary | ICD-10-CM | POA: Diagnosis not present

## 2023-03-03 DIAGNOSIS — R296 Repeated falls: Secondary | ICD-10-CM | POA: Diagnosis present

## 2023-03-03 DIAGNOSIS — K31819 Angiodysplasia of stomach and duodenum without bleeding: Secondary | ICD-10-CM | POA: Diagnosis not present

## 2023-03-03 LAB — CBC WITH DIFFERENTIAL/PLATELET
Abs Immature Granulocytes: 0.03 10*3/uL (ref 0.00–0.07)
Basophils Absolute: 0 10*3/uL (ref 0.0–0.1)
Basophils Relative: 1 %
Eosinophils Absolute: 0 10*3/uL (ref 0.0–0.5)
Eosinophils Relative: 1 %
HCT: 16.4 % — ABNORMAL LOW (ref 39.0–52.0)
Hemoglobin: 4.5 g/dL — CL (ref 13.0–17.0)
Immature Granulocytes: 1 %
Lymphocytes Relative: 17 %
Lymphs Abs: 1 10*3/uL (ref 0.7–4.0)
MCH: 23.7 pg — ABNORMAL LOW (ref 26.0–34.0)
MCHC: 27.4 g/dL — ABNORMAL LOW (ref 30.0–36.0)
MCV: 86.3 fL (ref 80.0–100.0)
Monocytes Absolute: 0.4 10*3/uL (ref 0.1–1.0)
Monocytes Relative: 7 %
Neutro Abs: 4.5 10*3/uL (ref 1.7–7.7)
Neutrophils Relative %: 73 %
Platelets: 481 10*3/uL — ABNORMAL HIGH (ref 150–400)
RBC: 1.9 MIL/uL — ABNORMAL LOW (ref 4.22–5.81)
RDW: 22.9 % — ABNORMAL HIGH (ref 11.5–15.5)
WBC: 6.1 10*3/uL (ref 4.0–10.5)
nRBC: 0.3 % — ABNORMAL HIGH (ref 0.0–0.2)

## 2023-03-03 LAB — URINALYSIS, ROUTINE W REFLEX MICROSCOPIC
Bilirubin Urine: NEGATIVE
Glucose, UA: NEGATIVE mg/dL
Hgb urine dipstick: NEGATIVE
Ketones, ur: NEGATIVE mg/dL
Nitrite: POSITIVE — AB
Protein, ur: 30 mg/dL — AB
Specific Gravity, Urine: 1.015 (ref 1.005–1.030)
WBC, UA: >50 WBC/hpf (ref 0–5)
pH: 5 (ref 5.0–8.0)

## 2023-03-03 LAB — I-STAT CHEM 8, ED
BUN: 30 mg/dL — ABNORMAL HIGH (ref 8–23)
Calcium, Ion: 1.14 mmol/L — ABNORMAL LOW (ref 1.15–1.40)
Chloride: 105 mmol/L (ref 98–111)
Creatinine, Ser: 1.7 mg/dL — ABNORMAL HIGH (ref 0.61–1.24)
Glucose, Bld: 97 mg/dL (ref 70–99)
HCT: 15 % — ABNORMAL LOW (ref 39.0–52.0)
Hemoglobin: 5.1 g/dL — CL (ref 13.0–17.0)
Potassium: 3.9 mmol/L (ref 3.5–5.1)
Sodium: 136 mmol/L (ref 135–145)
TCO2: 20 mmol/L — ABNORMAL LOW (ref 22–32)

## 2023-03-03 LAB — LIPASE, BLOOD: Lipase: 45 U/L (ref 11–51)

## 2023-03-03 LAB — LACTIC ACID, PLASMA
Lactic Acid, Venous: 2.9 mmol/L (ref 0.5–1.9)
Lactic Acid, Venous: 2 mmol/L (ref 0.5–1.9)

## 2023-03-03 LAB — COMPREHENSIVE METABOLIC PANEL
ALT: 13 U/L (ref 0–44)
AST: 14 U/L — ABNORMAL LOW (ref 15–41)
Albumin: 3.4 g/dL — ABNORMAL LOW (ref 3.5–5.0)
Alkaline Phosphatase: 37 U/L — ABNORMAL LOW (ref 38–126)
Anion gap: 11 (ref 5–15)
BUN: 33 mg/dL — ABNORMAL HIGH (ref 8–23)
CO2: 19 mmol/L — ABNORMAL LOW (ref 22–32)
Calcium: 8.9 mg/dL (ref 8.9–10.3)
Chloride: 106 mmol/L (ref 98–111)
Creatinine, Ser: 1.61 mg/dL — ABNORMAL HIGH (ref 0.61–1.24)
GFR, Estimated: 42 mL/min — ABNORMAL LOW (ref >60)
Glucose, Bld: 111 mg/dL — ABNORMAL HIGH (ref 70–99)
Potassium: 3.7 mmol/L (ref 3.5–5.1)
Sodium: 136 mmol/L (ref 135–145)
Total Bilirubin: 0.2 mg/dL — ABNORMAL LOW (ref 0.3–1.2)
Total Protein: 6.2 g/dL — ABNORMAL LOW (ref 6.5–8.1)

## 2023-03-03 LAB — PREPARE RBC (CROSSMATCH)

## 2023-03-03 LAB — HEMOGLOBIN AND HEMATOCRIT, BLOOD
HCT: 21.6 % — ABNORMAL LOW (ref 39.0–52.0)
Hemoglobin: 6.9 g/dL — CL (ref 13.0–17.0)

## 2023-03-03 MED ORDER — PANTOPRAZOLE SODIUM 40 MG IV SOLR
40.0000 mg | Freq: Once | INTRAVENOUS | Status: AC
  Administered 2023-03-03: 40 mg via INTRAVENOUS
  Filled 2023-03-03: qty 10

## 2023-03-03 MED ORDER — SODIUM CHLORIDE 0.9% IV SOLUTION: Freq: Once | INTRAVENOUS | Status: AC

## 2023-03-03 MED ORDER — SODIUM CHLORIDE 0.9% IV SOLUTION: Freq: Once | INTRAVENOUS | Status: DC

## 2023-03-03 MED ORDER — MIRABEGRON ER 25 MG PO TB24
25.0000 mg | ORAL_TABLET | Freq: Every day | ORAL | Status: DC
  Administered 2023-03-03 – 2023-03-04 (×2): 25 mg via ORAL
  Filled 2023-03-03 (×3): qty 1

## 2023-03-03 MED ORDER — OXYCODONE-ACETAMINOPHEN 10-325 MG PO TABS: 1.0000 | ORAL_TABLET | Freq: Three times a day (TID) | ORAL | Status: DC | PRN

## 2023-03-03 MED ORDER — SODIUM CHLORIDE 0.9 % IV SOLN: 1.0000 g | INTRAVENOUS | Status: DC

## 2023-03-03 MED ORDER — OXYCODONE HCL 5 MG PO TABS
10.0000 mg | ORAL_TABLET | Freq: Three times a day (TID) | ORAL | Status: DC | PRN
  Administered 2023-03-04 – 2023-03-06 (×3): 10 mg via ORAL
  Filled 2023-03-03 (×3): qty 2

## 2023-03-03 MED ORDER — TAMSULOSIN HCL 0.4 MG PO CAPS
0.4000 mg | ORAL_CAPSULE | Freq: Every day | ORAL | Status: DC
  Administered 2023-03-03 – 2023-03-04 (×2): 0.4 mg via ORAL
  Filled 2023-03-03 (×2): qty 1

## 2023-03-03 MED ORDER — PRAVASTATIN SODIUM 40 MG PO TABS
80.0000 mg | ORAL_TABLET | Freq: Every day | ORAL | Status: DC
  Administered 2023-03-03 – 2023-03-07 (×4): 80 mg via ORAL
  Filled 2023-03-03 (×4): qty 2

## 2023-03-03 MED ORDER — SODIUM CHLORIDE 0.9 % IV SOLN
1.0000 g | INTRAVENOUS | Status: DC
  Administered 2023-03-04 – 2023-03-05 (×2): 1 g via INTRAVENOUS
  Filled 2023-03-03 (×2): qty 10

## 2023-03-03 MED ORDER — IOHEXOL 350 MG/ML SOLN
75.0000 mL | Freq: Once | INTRAVENOUS | Status: AC | PRN
  Administered 2023-03-03: 75 mL via INTRAVENOUS

## 2023-03-03 MED ORDER — ACETAMINOPHEN 325 MG PO TABS
325.0000 mg | ORAL_TABLET | Freq: Three times a day (TID) | ORAL | Status: DC | PRN
  Administered 2023-03-06: 325 mg via ORAL
  Filled 2023-03-03: qty 1

## 2023-03-03 MED ORDER — OXYBUTYNIN CHLORIDE ER 10 MG PO TB24
10.0000 mg | ORAL_TABLET | Freq: Every day | ORAL | Status: DC
  Administered 2023-03-03 – 2023-03-04 (×2): 10 mg via ORAL
  Filled 2023-03-03 (×3): qty 1

## 2023-03-03 MED ORDER — SODIUM CHLORIDE 0.9 % IV SOLN
1.0000 g | Freq: Once | INTRAVENOUS | Status: AC
  Administered 2023-03-03: 1 g via INTRAVENOUS
  Filled 2023-03-03: qty 10

## 2023-03-03 MED ORDER — PANTOPRAZOLE SODIUM 40 MG IV SOLR
40.0000 mg | Freq: Two times a day (BID) | INTRAVENOUS | Status: DC
  Administered 2023-03-04 – 2023-03-06 (×5): 40 mg via INTRAVENOUS
  Filled 2023-03-03 (×5): qty 10

## 2023-03-03 NOTE — ED Provider Notes (Signed)
  Physical Exam  BP 131/76   Pulse (!) 59   Temp 98.3 F (36.8 C) (Oral)   Resp 18   SpO2 99%   Physical Exam  Procedures  .Critical Care  Performed by: Cristie Hem, MD Authorized by: Cristie Hem, MD   Critical care provider statement:    Critical care time (minutes):  30   Critical care was necessary to treat or prevent imminent or life-threatening deterioration of the following conditions: gi bleed.   Critical care was time spent personally by me on the following activities:  Development of treatment plan with patient or surrogate, discussions with consultants, evaluation of patient's response to treatment, examination of patient, ordering and review of laboratory studies, ordering and review of radiographic studies, ordering and performing treatments and interventions, pulse oximetry, re-evaluation of patient's condition and review of old charts   ED Course / MDM   Clinical Course as of 03/03/23 1848  Wed Mar 03, 2023  1326 Hemoglobin(!!): 4.5 Down from 9.1 3 weeks ago. Will consent for blood [HN]  1326 WBC: 6.1 [HN]  1326 Platelets(!): 481 [HN]  1430 Lactic Acid, Venous(!!): 2.9 Have ordered blood  [HN]  1430 Creatinine(!): 1.61 At approximate baseline [HN]  1430 Lipase: 45 [HN]  1508 Lactic Acid, Venous(!!): 2.0 Downtrending [HN]  1619 Recevied sign out from Dr. Mayra Neer pending CT scan, admission for acute GI bleeding [WS]  5465 CT Abdomen without acute process.  [WS]  6812 Discussed with IM teaching service who will admit. Receiving blood. Currently stable.  [WS]  7517 Discussed with Dr. Silverio Decamp with [facility]. They will consult. Agree with PPI+transfusion. Urinalysis also concerning for UTI. Will send culture and give dose of ceftriaxone.  [WS]  1848 Patient admitted to IM teaching service.  [WS]    Clinical Course User Index [HN] Audley Hose, MD [WS] Cristie Hem, MD   Medical Decision Making Amount and/or Complexity of Data  Reviewed Labs: ordered. Decision-making details documented in ED Course. Radiology: ordered.  Risk Prescription drug management. Decision regarding hospitalization.        Cristie Hem, MD 03/03/23 [phone]

## 2023-03-03 NOTE — H&P (Cosign Needed)
Date: 03/03/2023               Patient Name:  [name] MRN: [id number]  DOB: [birth year] Age / Sex: 82 y.o., male   PCP: Marcie Mowers, FNP         Medical Service: Internal Medicine Teaching Service         Attending Physician: Dr. Velna Ochs, MD    First Contact: Dr. Leigh Aurora Pager: [phone]  Second Contact: Dr. Alfonse Spruce Pager: [phone]       After Hours (After 5p/  First Contact Pager: [phone]  weekends / holidays): Second Contact Pager: [phone]   Chief Complaint: melena  History of Present Illness:  Russell [name] is a 82 year old male living with type 2 diabetes, iron deficiency anemia, PAD, who presents to the emergency room with abdominal pain and melena.  He was admitted last month for syncope, found to have symptomatic anemia from upper GI bleed.  EGD showed 2 AVMs in the stomach and 1 AVM in the duodenum status post APC.  He was discharged with 6 weeks of PPI and asked to follow-up with GI.  Hemoglobin at discharge was 9.1.  Patient reports continuing having melena after discharge.  He denies hematemesis or hematochezia.  He reports symptoms of lightheadedness, dizziness, presyncope and dyspnea.  He denies syncope or chest pain.  He is unsure if he is to continue taking Plavix.  He said that he takes all his medication as instructed.  Patient reports symptoms of dysuria that started 2-3 days ago.  He also reports urinary frequency.  He reports lower urinary tract symptoms that he is on tamsulosin.  In the ED, his hemoglobin was 4.5 which he received 2 units of blood.  PPI was started and GI was consulted.  Patient was hemodynamically stable.  Meds:  No outpatient medications have been marked as taking for the 03/03/23 encounter [facility] Encounter).  -pravastatin 80 mg daily -Tamsulosin 0.4 mg daily -Oxybutynin 10 mg daily -Myrbetriq 25 mg daily -Percocet 10-325 mg every 8 as needed -Protonix 40 mg twice daily   Allergies: Allergies as of  03/03/2023 - Unable to Assess 03/03/2023  Allergen Reaction Noted   Ultram [tramadol] Other (See Comments) 04/10/2020   Past Medical History:  Diagnosis Date   AKI (acute kidney injury) ([place]) 06/23/2017   ARM PAIN 06/03/2010   Qualifier: Diagnosis of  By: [place], LPN, [place], LUMBAR   Atherosclerosis of aorta ([place]) 02/14/2022   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   BPH with obstruction/lower urinary tract symptoms    BPH with urinary obstruction 01/15/2016   Cerebral atrophy (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)    Coronary atherosclerosis    denied knowledge of this 10/27/16   Depression    Diverticulosis of colon    DYSPHAGIA UNSPECIFIED 05/16/2009   Qualifier: Diagnosis of  By: Amil Amen MD, [place]     Dyspnea    EARLY SATIETY 11/04/2007   Qualifier: Diagnosis of  By: Amil Amen MD, Benjamine Mola     ED (erectile dysfunction)    Full dentures    GERD (gastroesophageal reflux disease)    not current   Glaucoma    History of gastritis    History of scabies    2008   History of seizure    2013--  x2   idiopathic --  none since   History of syncope    03-22-2014  DX  VAGAL RESPONSE   Hyperlipidemia, mixed    Hypertension    Mitral regurgitation    Peripheral arterial disease ([place])    one vessel runoff bilaterally via peroneal arteries   Prostate cancer ([place])    Seizures ([place]) 03/2014   ? or syncope- patient refused to be admitted for further studies- "felt better"   Type 2 diabetes, diet controlled ([place])    patient and his wife said no- have not been told 01/21/17   Wears glasses     Family History:  Family History  Problem Relation Age of Onset   Unexplained death Mother        died at a young age, no known cause   Heart attack Father        age unknown   Diabetes Other     Cancer Other      Social History:  -Lives with his wife, independent with ADLs -No alcohol or drug use -Smokes about 1 pack a day  Review of Systems: A complete ROS was negative except as per HPI.   Physical Exam: Blood pressure 131/76, pulse (!) 59, temperature 98.3 F (36.8 C), temperature source Oral, resp. rate 18, SpO2 99 %. Physical Exam Constitutional:      General: He is not in acute distress.    Appearance: He is not ill-appearing or toxic-appearing.  HENT:     Head: Normocephalic.  Eyes:     General:        Right eye: No discharge.        Left eye: No discharge.     Conjunctiva/sclera: Conjunctivae normal.  Cardiovascular:     Rate and Rhythm: Normal rate and regular rhythm.  Pulmonary:     Effort: Pulmonary effort is normal. No respiratory distress.     Breath sounds: Normal breath sounds. No wheezing.  Abdominal:     General: Bowel sounds are normal. There is no distension.     Palpations: Abdomen is soft.     Tenderness: There is no abdominal tenderness.  Musculoskeletal:        General: Normal range of motion.     Cervical back: Normal range of motion.     Right lower leg: No edema.     Left lower leg: No edema.  Skin:    General: Skin is warm.  Neurological:     Mental Status: He is alert. Mental status is at baseline.  Psychiatric:        Mood and Affect: Mood normal.        Behavior: Behavior normal.      EKG: personally reviewed my interpretation is sinus rhythm with PVC  Assessment & Plan by Problem: Principal Problem:   Upper GI bleed Active Problems:   Peripheral arterial disease (HCC)   CKD (chronic kidney disease) stage 3, GFR 30-59 ml/min (HCC) - baseline SCr 2.0   Symptomatic anemia  [name] is a 82 year old male living with type 2 diabetes, iron deficiency anemia, PAD, stomach and duodenal AVM, who was admitted for symptomatic anemia from upper GI bleed.  Symptomatic anemia Upper GI bleed His melena is likely due to  upper GI bleed.  CTA did not show any evidence of bleeding.  EGD notes last admission mention that he may have more AVMs in the small intestine and colon.  He may need an enteroscopy or capsule endoscopy this time. -Transfuse 2  unit of blood.  Follow-up posttransfusion H&H -Will need IV iron transfusion as well -Pantoprazole 40 mg twice daily -Clear liquid diet for now.  N.p.o. after midnight -Pending GI recommendation  Urinary tract infection UA showed positive nitrite, large leukocyte with pyuria.  He also have symptoms consistent with UTI.  Pending urine culture. It seems that patient has frequent urinary tract infection.  His last 2 culture grew E. coli and staph epididymis.  He will need urology referral after discharge for these recurrent UTI. -Treatment with IV ceftriaxone  PAD Underwent angiography in 2022 for left SFA stenosis.  The notes that he needs to be on aspirin indefinitely. -Continue pravastatin -Hold ASA or Plavix for now.   CKD 3A Kidney function stable  BPH -Continue tamsulosin  Urinary issue -Continue oxybutynin and Myrbetriq  Full code Diet: Clear liquid diet DVT: SCD IVF: N/A  Dispo: Admit patient to Inpatient with expected length of stay greater than 2 midnights.  Signed: Gaylan Gerold, DO 03/03/2023, 7:12 PM  Pager: [phone] After 5pm on weekdays and 1pm on weekends: On Call pager: [phone]

## 2023-03-03 NOTE — ED Triage Notes (Addendum)
Patient BIB EMS coming from home with c/o ABD pain. In hospital x2wks ago for GI bleed. Continues to have black stools. Patient on Plavix. VSS. CBG 140.

## 2023-03-03 NOTE — ED Provider Notes (Signed)
[facility] Provider Note   CSN: [id number] Arrival date & time: 03/03/23  1130     History  Chief Complaint  Patient presents with   Abdominal Pain   Melena    Russell [name] is a 82 y.o. male with T2DM, iron deficiency anemia, tobacco abuse, peripheral artery disease, syncope, recent UGIB 2/2 gastric/duodenal AVMs s/p APC cauterization presents with melena/abdominal pain.   Patient BIB EMS coming from home with c/o central abdominal pain. Per chart review patient was hospitalized from 02/03/23 to 02/06/23 for UGIB 2/2 gastric/duodenal AVMs s/p APC cauterization. He states he has been bleeding with melena pretty much every day since he left the hospital, it never went away. He takes plavix but no blood thinners. Also endorses intermittent central abdominal pain that at times can be severe. No associated f/c, nausea/vomiting, hematuria but does endorse some dysuria that started 2-3 days ago and urinary frequency. Also endorses fatigue, lightheadedness, dyspnea with exertion.    Abdominal Pain      Home Medications Prior to Admission medications   Medication Sig Start Date End Date Taking? Authorizing Provider  ALPHAGAN P 0.1 % SOLN Place 1 drop into the right eye 3 (three) times daily. 03/23/22   [provider]  bimatoprost (LUMIGAN) 0.01 % SOLN Place 1 drop into both eyes at bedtime. 04/10/13   Dhungel, Nishant, MD  Brinzolamide-Brimonidine (SIMBRINZA) 1-0.2 % SUSP Place 1 drop into both eyes 3 (three) times daily.    [provider]  mirabegron ER (MYRBETRIQ) 25 MG TB24 tablet Take 25 mg by mouth daily.    [provider]  Multiple Vitamin (MULTIVITAMIN WITH MINERALS) TABS tablet Take 1 tablet by mouth daily.    [provider]  oxybutynin (DITROPAN-XL) 10 MG 24 hr tablet Take 10 mg by mouth daily. 11/22/22   [provider]  oxyCODONE-acetaminophen (PERCOCET) 10-325 MG tablet Take 1 tablet by  mouth every 8 (eight) hours as needed for pain.    [provider]  oxyCODONE-acetaminophen (PERCOCET) 10-325 MG tablet Take 1 tablet by mouth every 8 (eight) hours as needed for up to 7 days for pain. 03/02/23 03/09/23  Hyatt, Max T, DPM  pantoprazole (PROTONIX) 40 MG tablet Take 1 tablet (40 mg total) by mouth 2 (two) times daily before a meal. 02/06/23   Virl Axe, MD  pravastatin (PRAVACHOL) 80 MG tablet Take 80 mg by mouth daily.  10/19/16   [provider]  PRESCRIPTION MEDICATION Take 1 tablet by mouth at bedtime. Cancer medication.    [provider]  tamsulosin (FLOMAX) 0.4 MG CAPS capsule Take 0.4 mg by mouth daily. 10/23/22   [provider]      Allergies    Ultram [tramadol]    Review of Systems   Review of Systems  Gastrointestinal:  Positive for abdominal pain.   Review of systems Negative for f/c.  A 10 point review of systems was performed and is negative unless otherwise reported in HPI.  Physical Exam Updated Vital Signs BP 125/75 (BP Location: Right Arm)   Pulse (!) 56   Temp 98.2 F (36.8 C) (Oral)   Resp 15   Wt 59.7 kg   SpO2 100%   BMI 16.90 kg/m  Physical Exam General: Normal appearing male, lying in bed.  HEENT: PERRLA, Sclera anicteric, dry mucous membranes, trachea midline.  Cardiology: RRR, no murmurs/rubs/gallops. BL radial and DP pulses equal bilaterally.  Resp: Normal respiratory rate and effort. CTAB, no  wheezes, rhonchi, crackles.  Abd: Diffusely mildly tender to palpation. Soft, non-distended. No rebound tenderness or guarding.  Rectal: No anal fissures/hemorrhoids. Frank melena noted. MSK: No peripheral edema or signs of trauma. Extremities without deformity or TTP. No cyanosis or clubbing. Skin: warm, dry.  Neuro: A&Ox4, CNs II-XII grossly intact. MAEs. Sensation grossly intact.  Psych: Normal mood and affect.   ED Results / Procedures / Treatments   Labs (all labs ordered are listed, but only abnormal  results are displayed) Labs Reviewed  CBC WITH DIFFERENTIAL/PLATELET - Abnormal; Notable for the following components:      Result Value   RBC 1.90 (*)    Hemoglobin 4.5 (*)    HCT 16.4 (*)    MCH 23.7 (*)    MCHC 27.4 (*)    RDW 22.9 (*)    Platelets 481 (*)    nRBC 0.3 (*)    All other components within normal limits  COMPREHENSIVE METABOLIC PANEL - Abnormal; Notable for the following components:   CO2 19 (*)    Glucose, Bld 111 (*)    BUN 33 (*)    Creatinine, Ser 1.61 (*)    Total Protein 6.2 (*)    Albumin 3.4 (*)    AST 14 (*)    Alkaline Phosphatase 37 (*)    Total Bilirubin 0.2 (*)    GFR, Estimated 42 (*)    All other components within normal limits  LACTIC ACID, PLASMA - Abnormal; Notable for the following components:   Lactic Acid, Venous 2.9 (*)    All other components within normal limits  LACTIC ACID, PLASMA - Abnormal; Notable for the following components:   Lactic Acid, Venous 2.0 (*)    All other components within normal limits  URINALYSIS, ROUTINE W REFLEX MICROSCOPIC - Abnormal; Notable for the following components:   Color, Urine AMBER (*)    APPearance CLOUDY (*)    Protein, ur 30 (*)    Nitrite POSITIVE (*)    Leukocytes,Ua LARGE (*)    Bacteria, UA MANY (*)    All other components within normal limits    EKG EKG Interpretation  Date/Time:  Wednesday March 03 2023 11:48:32 EDT Ventricular Rate:  73 PR Interval:  141 QRS Duration: 99 QT Interval:  436 QTC Calculation: 481 R Axis:   78 Text Interpretation: Sinus rhythm Ventricular premature complex Borderline prolonged QT interval Confirmed by Cindee Lame [id number]) on 03/03/2023 3:09:06 PM  Radiology CT angio GIB: 1. No intraluminal contrast extravasation or other findings to localize nidus of GI bleeding. 2. Descending and sigmoid diverticulosis without evidence of acute diverticulitis. 3. Normal contour and caliber of the abdominal aorta. No evidence of aneurysm, dissection, or other  acute aortic findings. Severe mixed calcific atherosclerosis.  Procedures .Critical Care  Performed by: Audley Hose, MD Authorized by: Audley Hose, MD   Critical care provider statement:    Critical care time (minutes):  30   Critical care was necessary to treat or prevent imminent or life-threatening deterioration of the following conditions:  Circulatory failure   Critical care was time spent personally by me on the following activities:  Development of treatment plan with patient or surrogate, discussions with consultants, evaluation of patient's response to treatment, examination of patient, ordering and review of laboratory studies, ordering and review of radiographic studies, ordering and performing treatments and interventions, pulse oximetry, re-evaluation of patient's condition and review of old charts     Medications Ordered in ED Medications - No data  to display  ED Course/ Medical Decision Making/ A&P                          Medical Decision Making Amount and/or Complexity of Data Reviewed Labs: ordered. Decision-making details documented in ED Course. Radiology: ordered.  Risk Prescription drug management. Decision regarding hospitalization.    This patient presents to the ED for concern of melena, abdominal pain; this involves an extensive number of treatment options, and is a complaint that carries with it a high risk of complications and morbidity.  I considered the following differential and admission for this acute, potentially life threatening condition.   MDM:    Melena noted on physical exam with diffuse abdominal tenderness. Concern for patient's continued GI bleeding symptoms include lightheadedness and dyspnea on exertion for blood loss anemia acute on chronic.  Consider also dehydration/orthostasis electrolyte abnormalities, renal injury, or urinary tract infection given patient's report of dysuria and urinary frequency.  Patient is not  tachycardic or hypotensive at this time, no concern for acute hemorrhagic shock. Consider his known AVMs but also given abdominal pain consider vascular catastrophe such as mesenteric ischemia, diverticulosis/litis, PUD/gastritis, or intraabdominal infection. Will obtain CT angio GIB. Will give PPI, likely resuscitate with PRBCs, Patient with no known h/o liver disease, will hold off on octreotide.   Clinical Course as of 03/20/23 1054  Wed Mar 03, 2023  1326 Hemoglobin(!!): 4.5 Down from 9.1 3 weeks ago. Will consent for blood [HN]  1326 WBC: 6.1 [HN]  1326 Platelets(!): 481 [HN]  1430 Lactic Acid, Venous(!!): 2.9 Have ordered blood  [HN]  1430 Creatinine(!): 1.61 At approximate baseline [HN]  1430 Lipase: 45 [HN]  1508 Lactic Acid, Venous(!!): 2.0 Downtrending [HN]  1619 Recevied sign out from Dr. Mayra Neer pending CT scan, admission for acute GI bleeding [WS]  [id number] CT Abdomen without acute process.  [WS]  [id number] Discussed with IM teaching service who will admit. Receiving blood. Currently stable.  [WS]  [id number] Discussed with Dr. Silverio Decamp with [facility]. They will consult. Agree with PPI+transfusion. Urinalysis also concerning for UTI. Will send culture and give dose of ceftriaxone.  [WS]  1848 Patient admitted to IM teaching service.  [WS]    Clinical Course User Index [HN] Audley Hose, MD [WS] Cristie Hem, MD    Labs: I Ordered, and personally interpreted labs.  The pertinent results include:  those listed above  Imaging Studies ordered: I ordered imaging studies including CT angio GIB Pending results  Additional history obtained from chart review.    Cardiac Monitoring: The patient was maintained on a cardiac monitor.  I personally viewed and interpreted the cardiac monitored which showed an underlying rhythm of: NSR  Reevaluation: After the interventions noted above, I reevaluated the patient and found that they have :improved  Social Determinants of  Health: Patient lives independently   Disposition:  Patient is signed out to the oncoming ED physician who is made aware of his history, presentation, exam, workup, and plan. Plan is to obtain CT angio GIB and likely admit patient for blood transfusions and continued w/u/management.  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   COPD (chronic obstructive pulmonary disease) ([place])    Coronary atherosclerosis    denied knowledge of this 10/27/16   Depression    Diverticulosis of colon    DYSPHAGIA UNSPECIFIED 05/16/2009   Qualifier: Diagnosis of  By: Amil Amen MD, Elizabeth     Dyspnea    EARLY SATIETY 11/04/2007   Qualifier: Diagnosis of  By: Amil Amen MD, Benjamine Mola     ED (erectile dysfunction)    Full dentures    GERD (gastroesophageal reflux disease)    not current   Glaucoma    History of gastritis    History of scabies    2008   History of seizure    2013-- x2   idiopathic --  none since   History of syncope    03-22-2014  DX  VAGAL RESPONSE   Hyperlipidemia, mixed    Hypertension    Mitral regurgitation    Peripheral arterial disease ([place])    one vessel runoff bilaterally via peroneal arteries   Prostate cancer ([place])    Seizures ([place]) 03/2014   ? or syncope- patient refused to be admitted for further studies- "felt better"   Type 2 diabetes, diet controlled ([place])    patient and his wife said no- have not been told  01/21/17   Wears glasses      Medicines No orders of the defined types were placed in this encounter.   I have reviewed the patients home medicines and have made adjustments as needed  Problem List / ED Course: Problem List Items Addressed This Visit       Genitourinary   Urinary retention   Relevant Orders   Ambulatory referral to Urology   Other Visit Diagnoses     Melena    -  Primary   Acute blood loss anemia       Relevant Medications   ferric gluconate (FERRLECIT) 250 mg in sodium chloride 0.9 % 250 mL IVPB (Completed)   iron polysaccharides (NIFEREX) 150 MG capsule                   This note was created using dictation software, which may contain spelling or grammatical errors.    Audley Hose, MD 03/20/23 [phone]

## 2023-03-04 ENCOUNTER — Other Ambulatory Visit: Payer: Self-pay

## 2023-03-04 ENCOUNTER — Encounter (HOSPITAL_COMMUNITY): Payer: Self-pay | Admitting: Internal Medicine

## 2023-03-04 DIAGNOSIS — D62 Acute posthemorrhagic anemia: Secondary | ICD-10-CM

## 2023-03-04 DIAGNOSIS — K921 Melena: Secondary | ICD-10-CM

## 2023-03-04 DIAGNOSIS — I509 Heart failure, unspecified: Secondary | ICD-10-CM

## 2023-03-04 DIAGNOSIS — I739 Peripheral vascular disease, unspecified: Secondary | ICD-10-CM

## 2023-03-04 DIAGNOSIS — K649 Unspecified hemorrhoids: Secondary | ICD-10-CM

## 2023-03-04 DIAGNOSIS — N390 Urinary tract infection, site not specified: Secondary | ICD-10-CM

## 2023-03-04 LAB — COMPREHENSIVE METABOLIC PANEL
ALT: 12 U/L (ref 0–44)
AST: 13 U/L — ABNORMAL LOW (ref 15–41)
Albumin: 3.1 g/dL — ABNORMAL LOW (ref 3.5–5.0)
Alkaline Phosphatase: 35 U/L — ABNORMAL LOW (ref 38–126)
Anion gap: 8 (ref 5–15)
BUN: 23 mg/dL (ref 8–23)
CO2: 20 mmol/L — ABNORMAL LOW (ref 22–32)
Calcium: 8.5 mg/dL — ABNORMAL LOW (ref 8.9–10.3)
Chloride: 108 mmol/L (ref 98–111)
Creatinine, Ser: 1.28 mg/dL — ABNORMAL HIGH (ref 0.61–1.24)
GFR, Estimated: 56 mL/min — ABNORMAL LOW (ref >60)
Glucose, Bld: 85 mg/dL (ref 70–99)
Potassium: 4 mmol/L (ref 3.5–5.1)
Sodium: 136 mmol/L (ref 135–145)
Total Bilirubin: 0.7 mg/dL (ref 0.3–1.2)
Total Protein: 5.7 g/dL — ABNORMAL LOW (ref 6.5–8.1)

## 2023-03-04 LAB — BPAM RBC
Blood Product Expiration Date: 202404102359
Blood Product Expiration Date: 202404102359
Blood Product Expiration Date: 202404102359
ISSUE DATE / TIME: 202403131457
ISSUE DATE / TIME: 202403131711
ISSUE DATE / TIME: 202403132319
Unit Type and Rh: 5100
Unit Type and Rh: 5100
Unit Type and Rh: 5100

## 2023-03-04 LAB — TYPE AND SCREEN
ABO/RH(D): O POS
Antibody Screen: NEGATIVE
Unit division: 0
Unit division: 0
Unit division: 0

## 2023-03-04 LAB — PROTIME-INR
INR: 1 (ref 0.8–1.2)
Prothrombin Time: 13.2 seconds (ref 11.4–15.2)

## 2023-03-04 LAB — CBC
HCT: 26 % — ABNORMAL LOW (ref 39.0–52.0)
Hemoglobin: 8.5 g/dL — ABNORMAL LOW (ref 13.0–17.0)
MCH: 27.2 pg (ref 26.0–34.0)
MCHC: 32.7 g/dL (ref 30.0–36.0)
MCV: 83.3 fL (ref 80.0–100.0)
Platelets: 379 10*3/uL (ref 150–400)
RBC: 3.12 MIL/uL — ABNORMAL LOW (ref 4.22–5.81)
RDW: 19.9 % — ABNORMAL HIGH (ref 11.5–15.5)
WBC: 6.7 10*3/uL (ref 4.0–10.5)
nRBC: 0 % (ref 0.0–0.2)

## 2023-03-04 MED ORDER — SODIUM CHLORIDE 0.9 % IV SOLN
250.0000 mg | Freq: Every day | INTRAVENOUS | Status: AC
  Administered 2023-03-04 – 2023-03-07 (×4): 250 mg via INTRAVENOUS
  Filled 2023-03-04 (×4): qty 20

## 2023-03-04 MED ORDER — LIDOCAINE HCL URETHRAL/MUCOSAL 2 % EX GEL
1.0000 | Freq: Once | CUTANEOUS | Status: AC
  Administered 2023-03-04: 1 via URETHRAL
  Filled 2023-03-04: qty 6

## 2023-03-04 MED ORDER — PEG-KCL-NACL-NASULF-NA ASC-C 100 G PO SOLR
1.0000 | Freq: Once | ORAL | Status: AC
  Administered 2023-03-05: 200 g via ORAL
  Filled 2023-03-04: qty 1

## 2023-03-04 MED ORDER — TAMSULOSIN HCL 0.4 MG PO CAPS
0.4000 mg | ORAL_CAPSULE | Freq: Every day | ORAL | Status: DC
  Administered 2023-03-04 – 2023-03-07 (×3): 0.4 mg via ORAL
  Filled 2023-03-04 (×3): qty 1

## 2023-03-04 NOTE — ED Notes (Signed)
ED TO INPATIENT HANDOFF REPORT  ED Nurse Name and Phone #: Mizael Sagar [phone]  S Name/Age/Gender Russell [name] 82 y.o. male Room/Bed: 001C/001C  Code Status   Code Status: Full Code  Home/SNF/Other Home Patient oriented to: self, place, time, and situation Is this baseline? Yes   Triage Complete: Triage complete  Chief Complaint Upper GI bleed [K92.2]  Triage Note Patient BIB EMS coming from home with c/o ABD pain. In hospital x2wks ago for GI bleed. Continues to have black stools. Patient on Plavix. VSS. CBG 140.     Allergies Allergies  Allergen Reactions   Ultram [Tramadol] Other (See Comments)    Confusion     Level of Care/Admitting Diagnosis ED Disposition     ED Disposition  Admit   Condition  --   Comment  Hospital Area: [facility] [id number]  Level of Care: Telemetry Medical [104]  May admit patient to [facility] or [facility] if equivalent level of care is available:: No  Covid Evaluation: Asymptomatic - no recent exposure (last 10 days) testing not required  Diagnosis: Upper GI bleed [id number]  Admitting Physician: Velna Ochs [id number]  Attending Physician: Velna Ochs [id number]  Certification:: I certify this patient will need inpatient services for at least 2 midnights  Estimated Length of Stay: 2          B Medical/Surgery History Past Medical History:  Diagnosis Date   AKI (acute kidney injury) ([place]) 06/23/2017   ARM PAIN 06/03/2010   Qualifier: Diagnosis of  By: Allean Found, LPN, [place], LUMBAR   Atherosclerosis of aorta ([place]) 02/14/2022   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   BPH with obstruction/lower urinary tract symptoms    BPH with urinary obstruction 01/15/2016   Cerebral atrophy ([place]) 02/14/2022   Cervical radiculopathy 01/27/2017   Chronic asymptomatic bacteriuria with pyuria 02/14/2022   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   COPD (chronic obstructive pulmonary  disease) ([place])    Coronary atherosclerosis    denied knowledge of this 10/27/16   Depression    Diverticulosis of colon    DYSPHAGIA UNSPECIFIED 05/16/2009   Qualifier: Diagnosis of  By: Amil Amen MD, [place]     Dyspnea    EARLY SATIETY 11/04/2007   Qualifier: Diagnosis of  By: Amil Amen MD, Benjamine Mola     ED (erectile dysfunction)    Full dentures    GERD (gastroesophageal reflux disease)    not current   Glaucoma    History of gastritis    History of scabies    2008   History of seizure    2013-- x2   idiopathic --  none since   History of syncope    03-22-2014  DX  VAGAL RESPONSE   Hyperlipidemia, mixed    Hypertension    Mitral regurgitation    Peripheral arterial disease ([place])    one vessel runoff bilaterally via peroneal arteries   Prostate cancer ([place])    Seizures ([place]) 03/2014   ? or syncope- patient refused to be admitted for further studies- "felt better"   Type 2 diabetes, diet controlled ([place])    patient and his wife said no- have not been told 01/21/17   Wears glasses    Past Surgical History:  Procedure Laterality Date   ABDOMINAL AORTOGRAM W/LOWER EXTREMITY Left 12/18/2020   Procedure: ABDOMINAL AORTOGRAM W/LOWER EXTREMITY;  Surgeon: Cherre Robins, MD;  Location: [facility];  Service: Cardiovascular;  Laterality: Left;  ABDOMINAL AORTOGRAM W/LOWER EXTREMITY N/A 02/26/2021   Procedure: ABDOMINAL AORTOGRAM W/LOWER EXTREMITY;  Surgeon: Cherre Robins, MD;  Location: [facility];  Service: Cardiovascular;  Laterality: N/A;   ANTERIOR CERVICAL DECOMP/DISCECTOMY FUSION  10-16-2009   C4 -- C6   ANTERIOR CERVICAL DECOMP/DISCECTOMY FUSION N/A 01/27/2017   Procedure: ANTERIOR CERVICAL DECOMPRESSION FUSION CERVICAL 3-4 WITH INSTRUMENTATION AND ALLOGRAFT;  Surgeon: Phylliss Bob, MD;  Location: [facility];  Service: Orthopedics;  Laterality: N/A;  ANTERIOR CERVICAL DECOMPRESSION FUSION CERVICAL 3-4 WITH INSTRUMENTATION AND ALLOGRAFT   CAPSULOTOMY Right 01/26/2013    Procedure: MINOR CAPSULOTOMY;  Surgeon: Myrtha Mantis., MD;  Location: [facility];  Service: Ophthalmology;  Laterality: Right;   CARPAL TUNNEL RELEASE Left 01/02/2019   Procedure: LEFT CARPAL TUNNEL RELEASE;  Surgeon: Leanora Cover, MD;  Location: [facility];  Service: Orthopedics;  Laterality: Left;   CARPECTOMY Right 03/13/2014   Procedure: RIGHT  PROXIMAL ROW CARPECTOMY;  Surgeon: Tennis Must, MD;  Location: [facility];  Service: Orthopedics;  Laterality: Right;   CARPECTOMY Left 10/22/2015   Procedure: LEFT WRIST PROXIMAL ROW CARPECTOMY ;  Surgeon: Leanora Cover, MD;  Location: [facility];  Service: Orthopedics;  Laterality: Left;   CARPOMETACARPAL ([place]) FUSION OF THUMB Right 03/13/2014   Procedure: RIGHT FUSION OF THUMB CARPOMETACARPAL [facility]) JOINT;  Surgeon: Tennis Must, MD;  Location: [facility];  Service: Orthopedics;  Laterality: Right;   CARPOMETACARPEL SUSPENSION PLASTY Left 04/04/2020   Procedure: LEFT THUMB TRAPECIECTOMY AND SUSPENSIONPLASTY;  Surgeon: Leanora Cover, MD;  Location: [facility];  Service: Orthopedics;  Laterality: Left;   CATARACT EXTRACTION W/ INTRAOCULAR LENS  IMPLANT, BILATERAL  2009   COLONOSCOPY  08-31-2007   COLONOSCOPY     CRYOABLATION N/A 01/14/2015   Procedure: CRYO ABLATION PROSTATE;  Surgeon: Ailene Rud, MD;  Location[facility];  Service: Urology;  Laterality: N/A;   ESOPHAGOGASTRODUODENOSCOPY  last one 09-11-2008   ESOPHAGOGASTRODUODENOSCOPY (EGD) WITH PROPOFOL N/A 02/04/2023   Procedure: ESOPHAGOGASTRODUODENOSCOPY (EGD) WITH PROPOFOL;  Surgeon: Daryel November, MD;  Location: [facility];  Service: Gastroenterology;  Laterality: N/A;   EXCISION MASS LEFT FACE , SUBORBITAL AREA, PLASTER RECONSTRUCTION  02-09-2011   EXCISIONAL DEBRIDEMENT AND REPAIR RIGHT QUADRICEP TENDON  07-05-2000   FOOT SURGERY Left    HOT HEMOSTASIS N/A 02/04/2023   Procedure:  HOT HEMOSTASIS (ARGON PLASMA COAGULATION/BICAP);  Surgeon: Daryel November, MD;  Location: [facility];  Service: Gastroenterology;  Laterality: N/A;   I & D EXTREMITY Right 05/24/2013   Procedure: RIGHT INDEX WOUND DEBRIDEMENT AND CLOSURE;  Surgeon: Jolyn Nap, MD;  Location: [facility];  Service: Orthopedics;  Laterality: Right;   INSERTION OF SUPRAPUBIC CATHETER N/A 01/14/2015   Procedure: SUPRAPUBIC TUBE PLACEMENT;  Surgeon: Ailene Rud, MD;  Location: [facility];  Service: Urology;  Laterality: N/A;   KNEE ARTHROSCOPY Right 2008   LARYNGOSCOPY AND ESOPHAGOSCOPY  06-13-2010   MARSUPIALIZATION LEFT LARGE VALLECULAR CYST   MASS EXCISION Left 10/22/2015   Procedure: EXCISION MASS OF LEFT INDEX FINGER;  Surgeon: Leanora Cover, MD;  Location: [facility];  Service: Orthopedics;  Laterality: Left;   ORCHIECTOMY Left 02/24/2015   Procedure: SCROTAL EXPLORATION WITH LEFT ORCHIECTOMY;  Surgeon: Kathie Rhodes, MD;  Location: [facility];  Service: Urology;  Laterality: Left;   PERIPHERAL VASCULAR INTERVENTION Left 02/26/2021   Procedure: PERIPHERAL VASCULAR INTERVENTION;  Surgeon: Cherre Robins, MD;  Location: [facility]  INVASIVE CV LAB;  Service: Cardiovascular;  Laterality: Left;  Superficial femoral   POSTERIOR CERVICAL FUSION/FORAMINOTOMY N/A 01/28/2017   Procedure: POSTERIOR SPINAL FUSION CERVICAL 3-4, CERVICAL 4-5, CERVICAL 5-6, CERVICAL 6-7 WITH INSTRUMENATION AND ALLOGRAFT;  Surgeon: Phylliss Bob, MD;  Location: [facility];  Service: Orthopedics;  Laterality: N/A;  POSTERIOR SPINAL FUSION CERVICAL 3-4, CERVICAL 4-5, CERVICAL 5-6, CERVICAL 6-7 WITH INSTRUMENATION AND ALLOGRAFT   SHOULDER ARTHROSCOPY/ DEBRIDEMENT LABRAL TEAR/  BICEPS TENOTOMY Left 09-24-2011   TONSILLECTOMY  as child   TRANSTHORACIC ECHOCARDIOGRAM  02-21-2010   normal LVF/  ef 55-60%/ mild to moderate MR/  moderate LAE/  mild TR   YAG LASER APPLICATION Right [id number]   Procedure: YAG LASER  APPLICATION;  Surgeon: Myrtha Mantis., MD;  Location: [facility] OR;  Service: Ophthalmology;  Laterality: Right;   YAG LASER CAPSULOTOMY, LEFT EYE  01-08-2011     A IV Location/Drains/Wounds Patient Lines/Drains/Airways Status     Active Line/Drains/Airways     Name Placement date Placement time Site Days   Peripheral IV 03/03/23 18 G Anterior;Left;Proximal Forearm 03/03/23  1346  Forearm  1   Peripheral IV 03/03/23 20 G Posterior;Right Hand 03/03/23  2337  Hand  1   External Urinary Catheter 03/03/23  1328  --  1            Intake/Output Last 24 hours  Intake/Output Summary (Last 24 hours) at 03/04/2023 1053 Last data filed at 03/04/2023 1011 Gross per 24 hour  Intake --  Output 1360 ml  Net -1360 ml    Labs/Imaging Results for orders placed or performed during the hospital encounter of 03/03/23 (from the past 48 hour(s))  CBC with Differential     Status: Abnormal   Collection Time: 03/03/23 12:38 PM  Result Value Ref Range   WBC 6.1 4.0 - 10.5 K/uL   RBC 1.90 (L) 4.22 - 5.81 MIL/uL   Hemoglobin 4.5 (LL) 13.0 - 17.0 g/dL    Comment: REPEATED TO VERIFY THIS CRITICAL RESULT HAS VERIFIED AND BEEN CALLED TO L. MYLES, RN BY LEAH KLAR ON 03 13 2024 AT 1325, AND HAS BEEN READ BACK.     HCT 16.4 (L) 39.0 - 52.0 %   MCV 86.3 80.0 - 100.0 fL   MCH 23.7 (L) 26.0 - 34.0 pg   MCHC 27.4 (L) 30.0 - 36.0 g/dL   RDW 22.9 (H) 11.5 - 15.5 %   Platelets 481 (H) 150 - 400 K/uL    Comment: REPEATED TO VERIFY   nRBC 0.3 (H) 0.0 - 0.2 %   Neutrophils Relative % 73 %   Neutro Abs 4.5 1.7 - 7.7 K/uL   Lymphocytes Relative 17 %   Lymphs Abs 1.0 0.7 - 4.0 K/uL   Monocytes Relative 7 %   Monocytes Absolute 0.4 0.1 - 1.0 K/uL   Eosinophils Relative 1 %   Eosinophils Absolute 0.0 0.0 - 0.5 K/uL   Basophils Relative 1 %   Basophils Absolute 0.0 0.0 - 0.1 K/uL   Immature Granulocytes 1 %   Abs Immature Granulocytes 0.03 0.00 - 0.07 K/uL    Comment: Performed at [facility],  [address] [place], [place] [address]  Comprehensive metabolic panel     Status: Abnormal   Collection Time: 03/03/23 12:38 PM  Result Value Ref Range   Sodium 136 135 - 145 mmol/L   Potassium 3.7 3.5 - 5.1 mmol/L   Chloride 106 98 - 111 mmol/L   CO2 19 (L) 22 -  32 mmol/L   Glucose, Bld 111 (H) 70 - 99 mg/dL    Comment: Glucose reference range applies only to samples taken after fasting for at least 8 hours.   BUN 33 (H) 8 - 23 mg/dL   Creatinine, Ser 1.61 (H) 0.61 - 1.24 mg/dL   Calcium 8.9 8.9 - 10.3 mg/dL   Total Protein 6.2 (L) 6.5 - 8.1 g/dL   Albumin 3.4 (L) 3.5 - 5.0 g/dL   AST 14 (L) 15 - 41 U/L   ALT 13 0 - 44 U/L   Alkaline Phosphatase 37 (L) 38 - 126 U/L   Total Bilirubin 0.2 (L) 0.3 - 1.2 mg/dL   GFR, Estimated 42 (L) >60 mL/min    Comment: (NOTE) Calculated using the CKD-EPI Creatinine Equation (2021)    Anion gap 11 5 - 15    Comment: Performed at [facility], [place] [address] [place], [place] [address]  Lipase, blood     Status: None   Collection Time: 03/03/23 12:38 PM  Result Value Ref Range   Lipase 45 11 - 51 U/L    Comment: Performed at [place] [address] [place], [place] [address]  Lactic acid, plasma     Status: Abnormal   Collection Time: 03/03/23 12:39 PM  Result Value Ref Range   Lactic Acid, Venous 2.9 (HH) 0.5 - 1.9 mmol/L    Comment: CRITICAL RESULT CALLED TO, READ BACK BY AND VERIFIED WITH H.NAASZ MD 1427 03/03/23 MCCORMICK K. ATTEMPTED CALL 1419. ATTEMPTED CALL 1422. ATTEMPTED CALL 1425. Performed at [facility], [place] [address], [place], [place] [address]   Type and screen [place]     Status: None   Collection Time: 03/03/23  1:40 PM  Result Value Ref Range   ABO/RH(D) O POS    Antibody Screen NEG    Sample Expiration 03/06/2023,2359    Unit Number [id number]    Blood Component Type RED CELLS,LR    Unit division 00    Status of Unit ISSUED,FINAL    Transfusion Status OK TO TRANSFUSE     Crossmatch Result Compatible    Unit Number [id number]    Blood Component Type RED CELLS,LR    Unit division 00    Status of Unit ISSUED,FINAL    Transfusion Status OK TO TRANSFUSE    Crossmatch Result Compatible    Unit Number [id number]    Blood Component Type RED CELLS,LR    Unit division 00    Status of Unit ISSUED,FINAL    Transfusion Status OK TO TRANSFUSE    Crossmatch Result      Compatible Performed at [facility], [place] [address] [place], [place] [address]   Prepare RBC (crossmatch)     Status: None   Collection Time: 03/03/23  1:40 PM  Result Value Ref Range   Order Confirmation      ORDER PROCESSED BY BLOOD BANK Performed at [facility], [place] [address] [place], [place] [address]   Lactic acid, plasma     Status: Abnormal   Collection Time: 03/03/23  2:02 PM  Result Value Ref Range   Lactic Acid, Venous 2.0 (HH) 0.5 - 1.9 mmol/L    Comment: CRITICAL RESULT CALLED TO, READ BACK BY AND VERIFIED WITH L.MILES RN 1458 03/03/23 MCCORMICK K Performed at [facility], [place] [address], [place], [place] [address]   Urinalysis, Routine w reflex microscopic -Urine, Clean Catch  Status: Abnormal   Collection Time: 03/03/23  3:39 PM  Result Value Ref Range   Color, Urine AMBER (A) YELLOW    Comment: BIOCHEMICALS MAY BE AFFECTED BY COLOR   APPearance CLOUDY (A) CLEAR   Specific Gravity, Urine 1.015 1.005 - 1.030   pH 5.0 5.0 - 8.0   Glucose, UA NEGATIVE NEGATIVE mg/dL   Hgb urine dipstick NEGATIVE NEGATIVE   Bilirubin Urine NEGATIVE NEGATIVE   Ketones, ur NEGATIVE NEGATIVE mg/dL   Protein, ur 30 (A) NEGATIVE mg/dL   Nitrite POSITIVE (A) NEGATIVE   Leukocytes,Ua LARGE (A) NEGATIVE   RBC / HPF 21-50 0 - 5 RBC/hpf   WBC, UA >50 0 - 5 WBC/hpf   Bacteria, UA MANY (A) NONE SEEN   Squamous Epithelial / HPF 0-5 0 - 5 /HPF   Mucus PRESENT    Hyaline Casts, UA PRESENT     Comment: Performed at [facility], [name] [place], [place]  [address]  I-stat chem 8, ED (not at [facility], [facility] or [facility])     Status: Abnormal   Collection Time: 03/03/23  3:44 PM  Result Value Ref Range   Sodium 136 135 - 145 mmol/L   Potassium 3.9 3.5 - 5.1 mmol/L   Chloride 105 98 - 111 mmol/L   BUN 30 (H) 8 - 23 mg/dL   Creatinine, Ser 1.70 (H) 0.61 - 1.24 mg/dL   Glucose, Bld 97 70 - 99 mg/dL    Comment: Glucose reference range applies only to samples taken after fasting for at least 8 hours.   Calcium, Ion 1.14 (L) 1.15 - 1.40 mmol/L   TCO2 20 (L) 22 - 32 mmol/L   Hemoglobin 5.1 (LL) 13.0 - 17.0 g/dL   HCT 15.0 (L) 39.0 - 52.0 %   Comment NOTIFIED PHYSICIAN   Hemoglobin and hematocrit, blood     Status: Abnormal   Collection Time: 03/03/23 10:00 PM  Result Value Ref Range   Hemoglobin 6.9 (LL) 13.0 - 17.0 g/dL    Comment: CRITICAL VALUE NOTED.  VALUE IS CONSISTENT WITH PREVIOUSLY REPORTED AND CALLED VALUE. REPEATED TO VERIFY    HCT 21.6 (L) 39.0 - 52.0 %    Comment: Performed at [facility], [place] [address], [place], [place] [address]  Prepare RBC (crossmatch)     Status: None   Collection Time: 03/03/23 11:04 PM  Result Value Ref Range   Order Confirmation      ORDER PROCESSED BY BLOOD BANK Performed at [facility], [place] [address], [place], [place] [address]   CBC     Status: Abnormal   Collection Time: 03/04/23  9:00 AM  Result Value Ref Range   WBC 6.7 4.0 - 10.5 K/uL   RBC 3.12 (L) 4.22 - 5.81 MIL/uL   Hemoglobin 8.5 (L) 13.0 - 17.0 g/dL   HCT 26.0 (L) 39.0 - 52.0 %   MCV 83.3 80.0 - 100.0 fL   MCH 27.2 26.0 - 34.0 pg   MCHC 32.7 30.0 - 36.0 g/dL   RDW 19.9 (H) 11.5 - 15.5 %   Platelets 379 150 - 400 K/uL   nRBC 0.0 0.0 - 0.2 %    Comment: Performed at [facility], [place] [address], [place], [place] [address]  Comprehensive metabolic panel     Status: Abnormal   Collection Time: 03/04/23  9:00 AM  Result Value Ref Range   Sodium 136 135 - 145 mmol/L   Potassium 4.0 3.5 -  5.1 mmol/L   Chloride 108 98 - 111  mmol/L   CO2 20 (L) 22 - 32 mmol/L   Glucose, Bld 85 70 - 99 mg/dL    Comment: Glucose reference range applies only to samples taken after fasting for at least 8 hours.   BUN 23 8 - 23 mg/dL   Creatinine, Ser 1.28 (H) 0.61 - 1.24 mg/dL   Calcium 8.5 (L) 8.9 - 10.3 mg/dL   Total Protein 5.7 (L) 6.5 - 8.1 g/dL   Albumin 3.1 (L) 3.5 - 5.0 g/dL   AST 13 (L) 15 - 41 U/L   ALT 12 0 - 44 U/L   Alkaline Phosphatase 35 (L) 38 - 126 U/L   Total Bilirubin 0.7 0.3 - 1.2 mg/dL   GFR, Estimated 56 (L) >60 mL/min    Comment: (NOTE) Calculated using the CKD-EPI Creatinine Equation (2021)    Anion gap 8 5 - 15    Comment: Performed at [facility], [place] [address] [place], [place] [address]  Protime-INR     Status: None   Collection Time: 03/04/23  9:00 AM  Result Value Ref Range   Prothrombin Time 13.2 11.4 - 15.2 seconds   INR 1.0 0.8 - 1.2    Comment: (NOTE) INR goal varies based on device and disease states. Performed at [facility], [place] [address], [place], [place] [address]    CT ANGIO GI BLEED  Result Date: 03/03/2023 CLINICAL DATA:  GI bleed, melena, hemoglobin 4 EXAM: CTA ABDOMEN AND PELVIS WITHOUT AND WITH CONTRAST TECHNIQUE: Multidetector CT imaging of the abdomen and pelvis was performed using the standard protocol during bolus administration of intravenous contrast. Multiplanar reconstructed images and MIPs were obtained and reviewed to evaluate the vascular anatomy. RADIATION DOSE REDUCTION: This exam was performed according to the departmental dose-optimization program which includes automated exposure control, adjustment of the mA and/or kV according to patient size and/or use of iterative reconstruction technique. CONTRAST:  40m OMNIPAQUE IOHEXOL 350 MG/ML SOLN COMPARISON:  CT abdomen pelvis, 02/13/2022 FINDINGS: VASCULAR Normal contour and caliber of the abdominal aorta. No evidence of aneurysm, dissection, or other acute aortic pathology. Standard branching pattern of  the abdominal aorta, with solitary bilateral renal arteries. Atherosclerosis of the branch vessel origins without high-grade stenosis. Review of the MIP images confirms the above findings. NON-VASCULAR Lower chest: No acute abnormality. Hepatobiliary: No solid liver abnormality is seen. Simple benign cyst of the right lobe of the liver, for which no further follow-up or characterization is required. No gallstones, gallbladder wall thickening, or biliary dilatation. Pancreas: Unremarkable. No pancreatic ductal dilatation or surrounding inflammatory changes. Spleen: Normal in size without significant abnormality. Adrenals/Urinary Tract: Adrenal glands are unremarkable. Multiple simple, benign bilateral renal cortical cysts, for which no further follow-up or characterization is required. Kidneys are otherwise normal, without renal calculi, solid lesion, or hydronephrosis. Bladder is unremarkable. Stomach/Bowel: Stomach is within normal limits. Appendix appears normal. No evidence of bowel wall thickening, distention, or inflammatory changes. Descending and sigmoid diverticulosis. No intraluminal contrast extravasation or other findings to localize nidus of GI bleeding. Lymphatic: No enlarged abdominal or pelvic lymph nodes. Reproductive: No mass or other significant abnormality. Other: No abdominal wall hernia or abnormality. No ascites. Musculoskeletal: No acute or significant osseous findings. IMPRESSION: 1. No intraluminal contrast extravasation or other findings to localize nidus of GI bleeding. 2. Descending and sigmoid diverticulosis without evidence of acute diverticulitis. 3. Normal contour and caliber of the abdominal aorta. No evidence of aneurysm,  dissection, or other acute aortic findings. Severe mixed calcific atherosclerosis. Aortic Atherosclerosis (ICD10-I70.0). Electronically Signed   By: Delanna Ahmadi M.D.   On: 03/03/2023 17:29    Pending Labs Unresulted Labs (From admission, onward)     Start      Ordered   03/03/23 1842  Urine Culture  Add-on,   AD       Question:  Indication  Answer:  Dysuria   03/03/23 1842            Vitals/Pain Today's Vitals   03/04/23 0735 03/04/23 0800 03/04/23 0900 03/04/23 1006  BP:  116/62 122/61 124/63  Pulse:  (!) 51 (!) 56 (!) 52  Resp:  '12 13 17  '$ Temp: 98.1 F (36.7 C)     TempSrc: Oral     SpO2:  100% 100% 99%    Isolation Precautions No active isolations  Medications Medications  oxybutynin (DITROPAN-XL) 24 hr tablet 10 mg (10 mg Oral Given 03/04/23 0923)  pravastatin (PRAVACHOL) tablet 80 mg (80 mg Oral Given 03/04/23 0923)  tamsulosin (FLOMAX) capsule 0.4 mg (0.4 mg Oral Given 03/04/23 0923)  mirabegron ER (MYRBETRIQ) tablet 25 mg (25 mg Oral Given 03/04/23 0923)  cefTRIAXone (ROCEPHIN) 1 g in sodium chloride 0.9 % 100 mL IVPB (has no administration in time range)  pantoprazole (PROTONIX) injection 40 mg (40 mg Intravenous Given 03/04/23 0923)  oxyCODONE (Oxy IR/ROXICODONE) immediate release tablet 10 mg (has no administration in time range)    And  acetaminophen (TYLENOL) tablet 325 mg (has no administration in time range)  0.9 %  sodium chloride infusion (Manually program via Guardrails IV Fluids) ( Intravenous Not Given 03/04/23 0245)  0.9 %  sodium chloride infusion (Manually program via Guardrails IV Fluids) (0 mLs Intravenous Stopped 03/03/23 2022)  pantoprazole (PROTONIX) injection 40 mg (40 mg Intravenous Given 03/03/23 1458)  iohexol (OMNIPAQUE) 350 MG/ML injection 75 mL (75 mLs Intravenous Contrast Given 03/03/23 1655)  pantoprazole (PROTONIX) injection 40 mg (40 mg Intravenous Given 03/03/23 1752)  cefTRIAXone (ROCEPHIN) 1 g in sodium chloride 0.9 % 100 mL IVPB (0 g Intravenous Stopped 03/03/23 2021)    Mobility Have not ambulated pt     Focused Assessments    R Recommendations: See Admitting Provider Note  Report given to:   Additional Notes: Pt is not happy about diet.

## 2023-03-04 NOTE — H&P (View-Only) (Signed)
 Referring Provider:  Teaching service Primary Care Physician:  Ntibrey, Daniel K, FNP Primary Gastroenterologist:  Dr. Cunningham  Reason for Consultation:  Anemia and black stools  HPI: Russell [name] is a 81 y.o. 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  He presents to the emergency department on this occasion with complaints of melena.  Has been feeling very weak.  He was admitted last month for syncope found to be anemic from upper GI bleed.  EGD performed as below.  EGD 02/04/2023: - The examined portions of the nasopharynx, oropharynx and larynx were normal. - Normal esophagus. - Two non-bleeding angiodysplastic lesions in the stomach. Treated with argon plasma coagulation (APC). - Small hiatal hernia. - Hematin (altered blood/coffee-ground-like material) in the gastric body. - A single non-bleeding angiodysplastic lesion in the duodenum. Treated with argon plasma coagulation (APC). - No specimens collected. 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 It is possible that there may be more AVMs in the small intestine or colon.   He reports continuing to have melena after discharge.  In the ED, his hemoglobin was 4.5 which he received 3 units of blood. PPI was started and GI was consulted. Patient was hemodynamically stable.   CT angio:  IMPRESSION: 1. No intraluminal contrast extravasation or other findings to localize nidus of GI bleeding. 2. Descending and sigmoid diverticulosis without evidence of acute diverticulitis. 3. Normal contour and caliber of the abdominal aorta. No evidence of aneurysm, dissection, or other acute aortic findings. Severe  mixed calcific atherosclerosis.   Aortic Atherosclerosis (ICD10-I70.0).  Is on PPI IV BID here.  Says that he was taking his PPI at home.  Has UTI here as well so is on abx.  BUN normal.  Last colonoscopy September 2008.  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   COPD (chronic obstructive pulmonary disease) (HCC)    Coronary atherosclerosis    denied knowledge of this 10/27/16   Depression    Diverticulosis of colon    DYSPHAGIA UNSPECIFIED 05/16/2009   Qualifier: Diagnosis of  By: Mulberry MD, Elizabeth     Dyspnea    EARLY SATIETY 11/04/2007   Qualifier: Diagnosis of  By: Mulberry MD, Elizabeth     ED (erectile dysfunction)    Full dentures    GERD (gastroesophageal reflux disease)    not current   Glaucoma    History of gastritis    History of scabies    2008   History of seizure    2013-- x2   idiopathic --  none since   History of syncope    03-22-2014  DX  VAGAL RESPONSE   Hyperlipidemia, mixed    Hypertension    Mitral regurgitation      Peripheral arterial disease (HCC)    one vessel runoff bilaterally via peroneal arteries   Prostate cancer (HCC)    Seizures (HCC) 03/2014   ? or syncope- patient refused to be admitted for further studies- "felt better"   Type 2 diabetes, diet controlled (HCC)    patient and his wife said no- have not been told 01/21/17   Wears glasses     Past Surgical History:  Procedure Laterality Date   ABDOMINAL AORTOGRAM W/LOWER  EXTREMITY Left 12/18/2020   Procedure: ABDOMINAL AORTOGRAM W/LOWER EXTREMITY;  Surgeon: Hawken, Thomas N, MD;  [facility]: [facility];  Service: Cardiovascular;  Laterality: Left;   ABDOMINAL AORTOGRAM W/LOWER EXTREMITY N/A 02/26/2021   Procedure: ABDOMINAL AORTOGRAM W/LOWER EXTREMITY;  Surgeon: Hawken, Thomas N, MD;  Location: [facility];  Service: Cardiovascular;  Laterality: N/A;   ANTERIOR CERVICAL DECOMP/DISCECTOMY FUSION  10-16-2009   C4 -- C6   ANTERIOR CERVICAL DECOMP/DISCECTOMY FUSION N/A 01/27/2017   Procedure: ANTERIOR CERVICAL DECOMPRESSION FUSION CERVICAL 3-4 WITH INSTRUMENTATION AND ALLOGRAFT;  Surgeon: Mark Dumonski, MD;  Location: [facility];  Service: Orthopedics;  Laterality: N/A;  ANTERIOR CERVICAL DECOMPRESSION FUSION CERVICAL 3-4 WITH INSTRUMENTATION AND ALLOGRAFT   CAPSULOTOMY Right 01/26/2013   Procedure: MINOR CAPSULOTOMY;  Surgeon: Thomas E Brewington Jr., MD;  Location: [facility];  Service: Ophthalmology;  Laterality: Right;   CARPAL TUNNEL RELEASE Left 01/02/2019   Procedure: LEFT CARPAL TUNNEL RELEASE;  Surgeon: Kuzma, Kevin, MD;  Location: [facility];  Service: Orthopedics;  Laterality: Left;   CARPECTOMY Right 03/13/2014   Procedure: RIGHT  PROXIMAL ROW CARPECTOMY;  Surgeon: Kevin R Kuzma, MD;  Location: [facility];  Service: Orthopedics;  Laterality: Right;   CARPECTOMY Left 10/22/2015   Procedure: LEFT WRIST PROXIMAL ROW CARPECTOMY ;  Surgeon: Kevin Kuzma, MD;  Location: [facility];  Service: Orthopedics;  Laterality: Left;   CARPOMETACARPAL (CMC) FUSION OF THUMB Right 03/13/2014   Procedure: RIGHT FUSION OF THUMB CARPOMETACARPAL (CMC) JOINT;  Surgeon: Kevin R Kuzma, MD;  Location: [facility];  Service: Orthopedics;  Laterality: Right;   CARPOMETACARPEL SUSPENSION PLASTY Left 04/04/2020   Procedure: LEFT THUMB TRAPECIECTOMY AND SUSPENSIONPLASTY;  Surgeon: Kuzma, Kevin, MD;  Location: [facility];   Service: Orthopedics;  Laterality: Left;   CATARACT EXTRACTION W/ INTRAOCULAR LENS  IMPLANT, BILATERAL  2009   COLONOSCOPY  08-31-2007   COLONOSCOPY     CRYOABLATION N/A 01/14/2015   Procedure: CRYO ABLATION PROSTATE;  Surgeon: Sigmund I Tannenbaum, MD;  Location: [facility];  Service: Urology;  Laterality: N/A;   ESOPHAGOGASTRODUODENOSCOPY  last one 09-11-2008   ESOPHAGOGASTRODUODENOSCOPY (EGD) WITH PROPOFOL N/A 02/04/2023   Procedure: ESOPHAGOGASTRODUODENOSCOPY (EGD) WITH PROPOFOL;  Surgeon: Cunningham, Scott E, MD;  Location: [facility];  Service: Gastroenterology;  Laterality: N/A;   EXCISION MASS LEFT FACE , SUBORBITAL AREA, PLASTER RECONSTRUCTION  02-09-2011   EXCISIONAL DEBRIDEMENT AND REPAIR RIGHT QUADRICEP TENDON  07-05-2000   FOOT SURGERY Left    HOT HEMOSTASIS N/A 02/04/2023   Procedure: HOT HEMOSTASIS (ARGON PLASMA COAGULATION/BICAP);  Surgeon: Cunningham, Scott E, MD;  [facility]: [facility];  Service: Gastroenterology;  Laterality: N/A;   I & D EXTREMITY Right 05/24/2013   Procedure: RIGHT INDEX WOUND DEBRIDEMENT AND CLOSURE;  Surgeon: David A Thompson, MD;  Location: [facility];  Service: Orthopedics;  Laterality: Right;   INSERTION OF SUPRAPUBIC CATHETER N/A 01/14/2015   Procedure: SUPRAPUBIC TUBE PLACEMENT;  Surgeon: Sigmund I Tannenbaum, MD;  Location:   [facility];  Service: Urology;  Laterality: N/A;   KNEE ARTHROSCOPY Right 2008   LARYNGOSCOPY AND ESOPHAGOSCOPY  06-13-2010   MARSUPIALIZATION LEFT LARGE VALLECULAR CYST   MASS EXCISION Left 10/22/2015   Procedure: EXCISION MASS OF LEFT INDEX FINGER;  Surgeon: Kevin Kuzma, MD;  Location: [facility];  Service: Orthopedics;  Laterality: Left;   ORCHIECTOMY Left 02/24/2015   Procedure: SCROTAL EXPLORATION WITH LEFT ORCHIECTOMY;  Surgeon: Mark Ottelin, MD;  [facility]: [facility];  Service: Urology;  Laterality: Left;   PERIPHERAL VASCULAR INTERVENTION Left 02/26/2021   Procedure:  PERIPHERAL VASCULAR INTERVENTION;  Surgeon: Hawken, Thomas N, MD;  [facility]: [facility];  Service: Cardiovascular;  Laterality: Left;  Superficial femoral   POSTERIOR CERVICAL FUSION/FORAMINOTOMY N/A 01/28/2017   Procedure: POSTERIOR SPINAL FUSION CERVICAL 3-4, CERVICAL 4-5, CERVICAL 5-6, CERVICAL 6-7 WITH INSTRUMENATION AND ALLOGRAFT;  Surgeon: Mark Dumonski, MD;  Location: [facility];  Service: Orthopedics;  Laterality: N/A;  POSTERIOR SPINAL FUSION CERVICAL 3-4, CERVICAL 4-5, CERVICAL 5-6, CERVICAL 6-7 WITH INSTRUMENATION AND ALLOGRAFT   SHOULDER ARTHROSCOPY/ DEBRIDEMENT LABRAL TEAR/  BICEPS TENOTOMY Left 09-24-2011   TONSILLECTOMY  as child   TRANSTHORACIC ECHOCARDIOGRAM  02-21-2010   normal LVF/  ef 55-60%/ mild to moderate MR/  moderate LAE/  mild TR   YAG LASER APPLICATION Right 01/26/2013   Procedure: YAG LASER APPLICATION;  Surgeon: Thomas E Brewington Jr., MD;  Location: [facility];  Service: Ophthalmology;  Laterality: Right;   YAG LASER CAPSULOTOMY, LEFT EYE  01-08-2011    Prior to Admission medications   Medication Sig Start Date End Date Taking? Authorizing Provider  ALPHAGAN P 0.1 % SOLN Place 1 drop into the right eye 3 (three) times daily. 03/23/22  Yes [provider]  bimatoprost (LUMIGAN) 0.01 % SOLN Place 1 drop into both eyes at bedtime. 04/10/13  Yes Dhungel, Nishant, MD  Brinzolamide-Brimonidine (SIMBRINZA) 1-0.2 % SUSP Place 1 drop into both eyes 3 (three) times daily.   Yes [provider]  magic mouthwash SOLN Take 10 mLs by mouth 3 (three) times daily as needed for mouth pain. 02/27/23  Yes [provider]  Multiple Vitamin (MULTIVITAMIN WITH MINERALS) TABS tablet Take 1 tablet by mouth daily.   Yes [provider]  oxyCODONE-acetaminophen (PERCOCET) 10-325 MG tablet Take 1 tablet by mouth every 8 (eight) hours as needed for up to 7 days for pain. 03/02/23 03/09/23 Yes Hyatt, Max T, DPM  pantoprazole (PROTONIX) 40 MG tablet Take 1 tablet (40 mg  total) by mouth 2 (two) times daily before a meal. 02/06/23  Yes Jinwala, Sagar, MD  pravastatin (PRAVACHOL) 80 MG tablet Take 80 mg by mouth daily.  10/19/16  Yes [provider]  tamsulosin (FLOMAX) 0.4 MG CAPS capsule Take 0.4 mg by mouth daily. 10/23/22  Yes [provider]  mirabegron ER (MYRBETRIQ) 25 MG TB24 tablet Take 25 mg by mouth daily. Patient not taking: Reported on 03/04/2023    [provider]  oxybutynin (DITROPAN-XL) 10 MG 24 hr tablet Take 10 mg by mouth daily. 11/22/22   [provider]  PRESCRIPTION MEDICATION Take 1 tablet by mouth at bedtime. Cancer medication.    [provider]    Current Facility-Administered Medications  Medication Dose Route Frequency Provider Last Rate Last Admin   0.9 %  sodium chloride infusion (Manually program via Guardrails IV Fluids)   Intravenous Once Mapp, Tavien, MD       oxyCODONE (Oxy IR/ROXICODONE) immediate release tablet 10 mg    10 mg Oral Q8H PRN Wilson, Heather W, RPH       And   acetaminophen (TYLENOL) tablet 325 mg  325 mg Oral Q8H PRN Wilson, Heather W, RPH       cefTRIAXone (ROCEPHIN) 1 g in sodium chloride 0.9 % 100 mL IVPB  1 g Intravenous Q24H Nguyen, Quan, DO       mirabegron ER (MYRBETRIQ) tablet 25 mg  25 mg Oral Daily Nguyen, Quan, DO   25 mg at 03/04/23 0923   oxybutynin (DITROPAN-XL) 24 hr tablet 10 mg  10 mg Oral Daily Nguyen, Quan, DO   10 mg at 03/04/23 0923   pantoprazole (PROTONIX) injection 40 mg  40 mg Intravenous Q12H Nguyen, Quan, DO   40 mg at 03/04/23 0923   pravastatin (PRAVACHOL) tablet 80 mg  80 mg Oral Daily Nguyen, Quan, DO   80 mg at 03/04/23 0923   tamsulosin (FLOMAX) capsule 0.4 mg  0.4 mg Oral Daily Nguyen, Quan, DO   0.4 mg at 03/04/23 0923   Current Outpatient Medications  Medication Sig Dispense Refill   ALPHAGAN P 0.1 % SOLN Place 1 drop into the right eye 3 (three) times daily.     bimatoprost (LUMIGAN) 0.01 % SOLN Place 1 drop into both eyes at bedtime.  30 mL 3   Brinzolamide-Brimonidine (SIMBRINZA) 1-0.2 % SUSP Place 1 drop into both eyes 3 (three) times daily.     magic mouthwash SOLN Take 10 mLs by mouth 3 (three) times daily as needed for mouth pain.     Multiple Vitamin (MULTIVITAMIN WITH MINERALS) TABS tablet Take 1 tablet by mouth daily.     oxyCODONE-acetaminophen (PERCOCET) 10-325 MG tablet Take 1 tablet by mouth every 8 (eight) hours as needed for up to 7 days for pain. 21 tablet 0   pantoprazole (PROTONIX) 40 MG tablet Take 1 tablet (40 mg total) by mouth 2 (two) times daily before a meal. 90 tablet 0   pravastatin (PRAVACHOL) 80 MG tablet Take 80 mg by mouth daily.   2   tamsulosin (FLOMAX) 0.4 MG CAPS capsule Take 0.4 mg by mouth daily.     mirabegron ER (MYRBETRIQ) 25 MG TB24 tablet Take 25 mg by mouth daily. (Patient not taking: Reported on 03/04/2023)     oxybutynin (DITROPAN-XL) 10 MG 24 hr tablet Take 10 mg by mouth daily.     PRESCRIPTION MEDICATION Take 1 tablet by mouth at bedtime. Cancer medication.      Allergies as of 03/03/2023 - Reviewed 03/03/2023  Allergen Reaction Noted   Ultram [tramadol] Other (See Comments) 04/10/2020    Family History  Problem Relation Age of Onset   Unexplained death Mother        died at a young age, no known cause   Heart attack Father        age unknown   Diabetes Other    Cancer Other     Social History   Socioeconomic History   Marital status: Married    Spouse name: Not on file   Number of children: 3   Years of education: 12   Highest education level: High school graduate  Occupational History   Occupation: Retired - poured concrete  Tobacco Use   Smoking status: Every Day    Packs/day: 1.00    Years: 59.00    Additional pack years: 0.00    Total pack years: 59.00    Types: Cigarettes   Smokeless tobacco: Never   Tobacco comments:      Started Chantix 10/26/16  Vaping Use   Vaping Use: Never used  Substance and Sexual Activity   Alcohol use: No    Comment: none  since approx 2014- heavy before   Drug use: No   Sexual activity: Not on file  Other Topics Concern   Not on file  Social History Narrative   Lives with wife and 2 of his children in a one story home.  Has 3 children.  Retired concrete worker.  Education: high school.   Right Handed   Social Determinants of Health   Financial Resource Strain: Not on file  Food Insecurity: Not on file  Transportation Needs: Not on file  Physical Activity: Not on file  Stress: Not on file  Social Connections: Not on file  Intimate Partner Violence: Not on file    Review of Systems: ROS is O/W negative except as mentioned in HPI.  Physical Exam: Vital signs in last 24 hours: Temp:  [98.1 F (36.7 C)-98.7 F (37.1 C)] 98.1 F (36.7 C) (03/14 0735) Pulse Rate:  [27-88] 52 (03/14 1006) Resp:  [11-20] 17 (03/14 1006) BP: (101-136)/(46-81) 124/63 (03/14 1006) SpO2:  [89 %-100 %] 99 % (03/14 1006)   General:  Alert, Well-developed, well-nourished, pleasant and cooperative in NAD Head:  Normocephalic and atraumatic. Eyes:  Sclera clear, no icterus.  Conjunctiva pink. Ears:  Normal auditory acuity. Mouth:  No deformity or lesions.   Lungs:  Clear throughout to auscultation.  No wheezes, crackles, or rhonchi.  Heart:  Slightly bradycardic but regular rhythm; no murmurs, clicks, rubs, or gallops. Abdomen:  Soft, non-distended.  BS present.  Non-tender.    Msk:  Symmetrical without gross deformities. Pulses:  Normal pulses noted. Extremities:  Without clubbing or edema. Neurologic:  Alert and oriented x 4;  grossly normal neurologically. Skin:  Intact without significant lesions or rashes. Psych:  Alert and cooperative. Normal mood and affect.  Intake/Output from previous day: 03/13 0701 - 03/14 0700 In: -  Out: 260 [Urine:260] Intake/Output this shift: Total I/O In: -  Out: 1100 [Urine:1100]  Lab Results: Recent Labs    03/03/23 1238 03/03/23 1544 03/03/23 2200 03/04/23 0900  WBC  6.1  --   --  6.7  HGB 4.5* 5.1* 6.9* 8.5*  HCT 16.4* 15.0* 21.6* 26.0*  PLT 481*  --   --  379   BMET Recent Labs    03/03/23 1238 03/03/23 1544 03/04/23 0900  NA 136 136 136  K 3.7 3.9 4.0  CL 106 105 108  CO2 19*  --  20*  GLUCOSE 111* 97 85  BUN 33* 30* 23  CREATININE 1.61* 1.70* 1.28*  CALCIUM 8.9  --  8.5*   LFT Recent Labs    03/04/23 0900  PROT 5.7*  ALBUMIN 3.1*  AST 13*  ALT 12  ALKPHOS 35*  BILITOT 0.7   PT/INR Recent Labs    03/04/23 0900  LABPROT 13.2  INR 1.0   Studies/Results: CT ANGIO GI BLEED  Result Date: 03/03/2023 CLINICAL DATA:  GI bleed, melena, hemoglobin 4 EXAM: CTA ABDOMEN AND PELVIS WITHOUT AND WITH CONTRAST TECHNIQUE: Multidetector CT imaging of the abdomen and pelvis was performed using the standard protocol during bolus administration of intravenous contrast. Multiplanar reconstructed images and MIPs were obtained and reviewed to evaluate the vascular anatomy. RADIATION DOSE REDUCTION: This exam was performed according to the departmental dose-optimization program which includes automated exposure control, adjustment of the mA and/or kV according to patient size and/or use of iterative   reconstruction technique. CONTRAST:  75mL OMNIPAQUE IOHEXOL 350 MG/ML SOLN COMPARISON:  CT abdomen pelvis, 02/13/2022 FINDINGS: VASCULAR Normal contour and caliber of the abdominal aorta. No evidence of aneurysm, dissection, or other acute aortic pathology. Standard branching pattern of the abdominal aorta, with solitary bilateral renal arteries. Atherosclerosis of the branch vessel origins without high-grade stenosis. Review of the MIP images confirms the above findings. NON-VASCULAR Lower chest: No acute abnormality. Hepatobiliary: No solid liver abnormality is seen. Simple benign cyst of the right lobe of the liver, for which no further follow-up or characterization is required. No gallstones, gallbladder wall thickening, or biliary dilatation. Pancreas:  Unremarkable. No pancreatic ductal dilatation or surrounding inflammatory changes. Spleen: Normal in size without significant abnormality. Adrenals/Urinary Tract: Adrenal glands are unremarkable. Multiple simple, benign bilateral renal cortical cysts, for which no further follow-up or characterization is required. Kidneys are otherwise normal, without renal calculi, solid lesion, or hydronephrosis. Bladder is unremarkable. Stomach/Bowel: Stomach is within normal limits. Appendix appears normal. No evidence of bowel wall thickening, distention, or inflammatory changes. Descending and sigmoid diverticulosis. No intraluminal contrast extravasation or other findings to localize nidus of GI bleeding. Lymphatic: No enlarged abdominal or pelvic lymph nodes. Reproductive: No mass or other significant abnormality. Other: No abdominal wall hernia or abnormality. No ascites. Musculoskeletal: No acute or significant osseous findings. IMPRESSION: 1. No intraluminal contrast extravasation or other findings to localize nidus of GI bleeding. 2. Descending and sigmoid diverticulosis without evidence of acute diverticulitis. 3. Normal contour and caliber of the abdominal aorta. No evidence of aneurysm, dissection, or other acute aortic findings. Severe mixed calcific atherosclerosis. Aortic Atherosclerosis (ICD10-I70.0). Electronically Signed   By: Alex D Bibbey M.D.   On: 03/03/2023 17:29    IMPRESSION:  *81 year old male with recurrent syncope with IDA with reported melenic stools x 6 months.  Was admitted last month for anemia, transfused, and underwent EGD with AVMs noted in the stomach and duodenum to which APC was applied.  Presenting again with weakness.  Hemoglobin again 4.5 g, received 3 units of packed red blood cells.  Hemoglobin increased to 8.5 g.  Suspect AVMs possibly in the colon and/or small bowel.  BUN normal.  *PAD, on ASA. No longer on Plavix.   *CHF, LV EF 45 - 50% per ECHO 01/2022  *UTI: E. coli and  staph seen on urine cultures.  On ceftriaxone.  PLAN: -Is on pantoprazole 40 mg IV twice daily.  Continue it for now. -Likely needs enteroscopy and colonoscopy, timing TBD, but planning for 3/16 diet orders have all been entered as appropriate. -Continue to monitor hemoglobin and transfuse further if needed.  Louise Victory D. Yamir Carignan  03/04/2023, 10:50 AM      

## 2023-03-04 NOTE — Progress Notes (Addendum)
HD#1 Subjective:   Summary: This is a 82 year old male with a past medical history of iron deficiency anemia, history of GI bleed, presents with melanotic stools.  Patient admitted for further evaluation management of suspected GI bleed.  Overnight Events: Overnight, hemoglobin resulted at 6.9.  1 more unit of blood ordered.  Patient evaluated bedside this morning.  Patient states he is very hungry.  He has not eaten about 3 to 4 days.  Patient states he has not had an appetite recently.  He denies any fever, chills, or sick contacts.  Patient denies any lightheadedness.  He denies any syncopal episodes.  Patient states he just wants to eat.  Objective:  Vital signs in last 24 hours: Vitals:   03/04/23 0735 03/04/23 0800 03/04/23 0900 03/04/23 1006  BP:  116/62 122/61 124/63  Pulse:  (!) 51 (!) 56 (!) 52  Resp:  '12 13 17  '$ Temp: 98.1 F (36.7 C)     TempSrc: Oral     SpO2:  100% 100% 99%   Supplemental O2: Room Air SpO2: 99 %   Physical Exam:  Constitutional: Resting in bed, no acute distress, cachectic HENT: Normocephalic, atraumatic Eyes: Pale conjunctival noted Cardiovascular: regular rate and rhythm, no m/r/g Pulmonary/Chest: normal work of breathing on room air, lungs clear to auscultation bilaterally Abdominal: Soft, nontender, normal active bowel sounds   Intake/Output Summary (Last 24 hours) at 03/04/2023 1103 Last data filed at 03/04/2023 1011 Gross per 24 hour  Intake --  Output 1360 ml  Net -1360 ml   Net IO Since Admission: -1,360 mL [03/04/23 1103]  Pertinent Labs:    Latest Ref Rng & Units 03/04/2023    9:00 AM 03/03/2023   10:00 PM 03/03/2023    3:44 PM  CBC  WBC 4.0 - 10.5 K/uL 6.7     Hemoglobin 13.0 - 17.0 g/dL 8.5  6.9  5.1   Hematocrit 39.0 - 52.0 % 26.0  21.6  15.0   Platelets 150 - 400 K/uL 379          Latest Ref Rng & Units 03/04/2023    9:00 AM 03/03/2023    3:44 PM 03/03/2023   12:38 PM  CMP  Glucose 70 - 99 mg/dL 85  97  111   BUN  8 - 23 mg/dL 23  30  33   Creatinine 0.61 - 1.24 mg/dL 1.28  1.70  1.61   Sodium 135 - 145 mmol/L 136  136  136   Potassium 3.5 - 5.1 mmol/L 4.0  3.9  3.7   Chloride 98 - 111 mmol/L 108  105  106   CO2 22 - 32 mmol/L 20   19   Calcium 8.9 - 10.3 mg/dL 8.5   8.9   Total Protein 6.5 - 8.1 g/dL 5.7   6.2   Total Bilirubin 0.3 - 1.2 mg/dL 0.7   0.2   Alkaline Phos 38 - 126 U/L 35   37   AST 15 - 41 U/L 13   14   ALT 0 - 44 U/L 12   13     Imaging: CT ANGIO GI BLEED  Result Date: 03/03/2023 CLINICAL DATA:  GI bleed, melena, hemoglobin 4 EXAM: CTA ABDOMEN AND PELVIS WITHOUT AND WITH CONTRAST TECHNIQUE: Multidetector CT imaging of the abdomen and pelvis was performed using the standard protocol during bolus administration of intravenous contrast. Multiplanar reconstructed images and MIPs were obtained and reviewed to evaluate the vascular anatomy. RADIATION DOSE REDUCTION: This  exam was performed according to the departmental dose-optimization program which includes automated exposure control, adjustment of the mA and/or kV according to patient size and/or use of iterative reconstruction technique. CONTRAST:  3m OMNIPAQUE IOHEXOL 350 MG/ML SOLN COMPARISON:  CT abdomen pelvis, 02/13/2022 FINDINGS: VASCULAR Normal contour and caliber of the abdominal aorta. No evidence of aneurysm, dissection, or other acute aortic pathology. Standard branching pattern of the abdominal aorta, with solitary bilateral renal arteries. Atherosclerosis of the branch vessel origins without high-grade stenosis. Review of the MIP images confirms the above findings. NON-VASCULAR Lower chest: No acute abnormality. Hepatobiliary: No solid liver abnormality is seen. Simple benign cyst of the right lobe of the liver, for which no further follow-up or characterization is required. No gallstones, gallbladder wall thickening, or biliary dilatation. Pancreas: Unremarkable. No pancreatic ductal dilatation or surrounding inflammatory  changes. Spleen: Normal in size without significant abnormality. Adrenals/Urinary Tract: Adrenal glands are unremarkable. Multiple simple, benign bilateral renal cortical cysts, for which no further follow-up or characterization is required. Kidneys are otherwise normal, without renal calculi, solid lesion, or hydronephrosis. Bladder is unremarkable. Stomach/Bowel: Stomach is within normal limits. Appendix appears normal. No evidence of bowel wall thickening, distention, or inflammatory changes. Descending and sigmoid diverticulosis. No intraluminal contrast extravasation or other findings to localize nidus of GI bleeding. Lymphatic: No enlarged abdominal or pelvic lymph nodes. Reproductive: No mass or other significant abnormality. Other: No abdominal wall hernia or abnormality. No ascites. Musculoskeletal: No acute or significant osseous findings. IMPRESSION: 1. No intraluminal contrast extravasation or other findings to localize nidus of GI bleeding. 2. Descending and sigmoid diverticulosis without evidence of acute diverticulitis. 3. Normal contour and caliber of the abdominal aorta. No evidence of aneurysm, dissection, or other acute aortic findings. Severe mixed calcific atherosclerosis. Aortic Atherosclerosis (ICD10-I70.0). Electronically Signed   By: ADelanna AhmadiM.D.   On: 03/03/2023 17:29    Assessment/Plan:   Principal Problem:   Upper GI bleed Active Problems:   Peripheral arterial disease (HCC)   CKD (chronic kidney disease) stage 3, GFR 30-59 ml/min (HCC) - baseline SCr 2.0   Symptomatic anemia   Patient Summary:  This is a 82year old male with a past medical history of iron deficiency anemia, history of GI bleed, presents with melanotic stools.  Patient admitted for further evaluation management of suspected GI bleed.  #Symptomatic acute blood loss anemia #Suspected upper GI bleed Patient initially presented with hemoglobin of 4.5.  Patient also has symptoms of lightheadedness and  dizziness.  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  Patient continues to have melena.  He denies any overnight bowel movements.  Patient has had 3 blood transfusions during stay so far.  Will continue to monitor CBC.  Posttransfusion hemoglobin 8.5.  GI following patient who might scope the patient in the next day or two.  CT angio GI showing no obvious brisk bleeds. Patient iron deficit greater than 1500.  Will proceed with IV infusions during hospitalization.  Patient does remain hemodynamically stable at this point. -Continue Protonix 40 mg IV twice daily -Start Ferrlecit 250 mg daily -Monitor CBC -GI following, appreciate recs -Clear liquid diet  #Urinary tract infection UA showing positive nitrites and large leukocytes with pyuria.  Patient having symptoms consistent with urinary tract infection.  Previous hospitalization, patient was treated with 3 days with ceftriaxone.  Patient started on ceftriaxone during hospitalization here.  Will continue to follow urine cultures. -  Follow cultures -Ceftriaxone day 2  #BPH #Urinary incontinence Patient remains on tamsulosin, Myrbetriq, and oxybutynin at home.  Patient also has recurrent urinary tract infections.  Patient could benefit from urology follow-up outpatient.  Patient did have bladder scan today showing greater than 800 cc of urine retention, will in and out today. -Continue tamsulosin -Continue Myrbetriq -Continue to hold oxybutynin -In and out cath  #PAD Patient is to be on Plavix at home for PAD, but was stopped last hospitalization for suspected GI bleed.  Patient remains on pravastatin.  No acute concerns at this time. -Do not start Plavix -Continue pravastatin  #Chronic pain Patient chronic pain and takes Percocet in the outpatient setting.  No acute concerns. -Continue Percocet as needed  #CKD 3A No acute concerns during  hospitalization  Diet: Clear liquid IVF: None,None VTE: SCDs Code: Full PT/OT recs: None, none.   Dispo: Anticipated discharge to Home in 3 days pending clinical improvement.   [facility] Resident PGY-1 [phone] Please contact the on call pager after 5 pm and on weekends at [phone].

## 2023-03-04 NOTE — Consult Note (Signed)
Referring Provider:  Teaching service Primary Care Physician:  Marcie Mowers, FNP Primary Gastroenterologist:  Dr. Candis Schatz  Reason for Consultation:  Anemia and black stools  HPI: Russell [name] is a 82 y.o. 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  He presents to the emergency department on this occasion with complaints of melena.  Has been feeling very weak.  He was admitted last month for syncope found to be anemic from upper GI bleed.  EGD performed as below.  EGD 02/04/2023: - The examined portions of the nasopharynx, oropharynx and larynx were normal. - Normal esophagus. - Two non-bleeding angiodysplastic lesions in the stomach. Treated with argon plasma coagulation (APC). - Small hiatal hernia. - Hematin (altered blood/coffee-ground-like material) in the gastric body. - A single non-bleeding angiodysplastic lesion in the duodenum. Treated with argon plasma coagulation (APC). - No specimens collected. 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 It is possible that there may be more AVMs in the small intestine or colon.   He reports continuing to have melena after discharge.  In the ED, his hemoglobin was 4.5 which he received 3 units of blood. PPI was started and GI was consulted. Patient was hemodynamically stable.   CT angio:  IMPRESSION: 1. No intraluminal contrast extravasation or other findings to localize nidus of GI bleeding. 2. Descending and sigmoid diverticulosis without evidence of acute diverticulitis. 3. Normal contour and caliber of the abdominal aorta. No evidence of aneurysm, dissection, or other acute aortic findings. Severe  mixed calcific atherosclerosis.   Aortic Atherosclerosis (ICD10-I70.0).  Is on PPI IV BID here.  Says that he was taking his PPI at home.  Has UTI here as well so is on abx.  BUN normal.  Last colonoscopy September 2008.  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   COPD (chronic obstructive pulmonary disease) ([place])    Coronary atherosclerosis    denied knowledge of this 10/27/16   Depression    Diverticulosis of colon    DYSPHAGIA UNSPECIFIED 05/16/2009   Qualifier: Diagnosis of  By: Amil Amen MD, [place]     Dyspnea    EARLY SATIETY 11/04/2007   Qualifier: Diagnosis of  By: Amil Amen MD, Benjamine Mola     ED (erectile dysfunction)    Full dentures    GERD (gastroesophageal reflux disease)    not current   Glaucoma    History of gastritis    History of scabies    2008   History of seizure    2013-- x2   idiopathic --  none since   History of syncope    03-22-2014  DX  VAGAL RESPONSE   Hyperlipidemia, mixed    Hypertension    Mitral regurgitation  Peripheral arterial disease ([place])    one vessel runoff bilaterally via peroneal arteries   Prostate cancer ([place])    Seizures ([place]) 03/2014   ? or syncope- patient refused to be admitted for further studies- "felt better"   Type 2 diabetes, diet controlled ([place])    patient and his wife said no- have not been told 01/21/17   Wears glasses     Past Surgical History:  Procedure Laterality Date   ABDOMINAL AORTOGRAM W/LOWER  EXTREMITY Left 12/18/2020   Procedure: ABDOMINAL AORTOGRAM W/LOWER EXTREMITY;  Surgeon: Cherre Robins, MD;  Location: [place] [facility];  Service: Cardiovascular;  Laterality: Left;   ABDOMINAL AORTOGRAM W/LOWER EXTREMITY N/A 02/26/2021   Procedure: ABDOMINAL AORTOGRAM W/LOWER EXTREMITY;  Surgeon: Cherre Robins, MD;  Location: [place];  Service: Cardiovascular;  Laterality: N/A;   ANTERIOR CERVICAL DECOMP/DISCECTOMY FUSION  10-16-2009   C4 -- C6   ANTERIOR CERVICAL DECOMP/DISCECTOMY FUSION N/A 01/27/2017   Procedure: ANTERIOR CERVICAL DECOMPRESSION FUSION CERVICAL 3-4 WITH INSTRUMENTATION AND ALLOGRAFT;  Surgeon: Phylliss Bob, MD;  Location: [facility];  Service: Orthopedics;  Laterality: N/A;  ANTERIOR CERVICAL DECOMPRESSION FUSION CERVICAL 3-4 WITH INSTRUMENTATION AND ALLOGRAFT   CAPSULOTOMY Right 01/26/2013   Procedure: MINOR CAPSULOTOMY;  Surgeon: Myrtha Mantis., MD;  Location: [place];  Service: Ophthalmology;  Laterality: Right;   CARPAL TUNNEL RELEASE Left 01/02/2019   Procedure: LEFT CARPAL TUNNEL RELEASE;  Surgeon: Leanora Cover, MD;  Location: [place];  Service: Orthopedics;  Laterality: Left;   CARPECTOMY Right 03/13/2014   Procedure: RIGHT  PROXIMAL ROW CARPECTOMY;  Surgeon: Tennis Must, MD;  Location: [place];  Service: Orthopedics;  Laterality: Right;   CARPECTOMY Left 10/22/2015   Procedure: LEFT WRIST PROXIMAL ROW CARPECTOMY ;  Surgeon: Leanora Cover, MD;  Location: [place];  Service: Orthopedics;  Laterality: Left;   CARPOMETACARPAL ([place]) FUSION OF THUMB Right 03/13/2014   Procedure: RIGHT FUSION OF THUMB CARPOMETACARPAL [facility]) JOINT;  Surgeon: Tennis Must, MD;  Location: [place];  Service: Orthopedics;  Laterality: Right;   CARPOMETACARPEL SUSPENSION PLASTY Left 04/04/2020   Procedure: LEFT THUMB TRAPECIECTOMY AND SUSPENSIONPLASTY;  Surgeon: Leanora Cover, MD;  Location: [place];   Service: Orthopedics;  Laterality: Left;   CATARACT EXTRACTION W/ INTRAOCULAR LENS  IMPLANT, BILATERAL  2009   COLONOSCOPY  08-31-2007   COLONOSCOPY     CRYOABLATION N/A 01/14/2015   Procedure: CRYO ABLATION PROSTATE;  Surgeon: Ailene Rud, MD;  [facility];  Service: Urology;  Laterality: N/A;   ESOPHAGOGASTRODUODENOSCOPY  last one 09-11-2008   ESOPHAGOGASTRODUODENOSCOPY (EGD) WITH PROPOFOL N/A 02/04/2023   Procedure: ESOPHAGOGASTRODUODENOSCOPY (EGD) WITH PROPOFOL;  Surgeon: Daryel November, MD;  Location: [facility];  Service: Gastroenterology;  Laterality: N/A;   EXCISION MASS LEFT FACE , SUBORBITAL AREA, PLASTER RECONSTRUCTION  02-09-2011   EXCISIONAL DEBRIDEMENT AND REPAIR RIGHT QUADRICEP TENDON  07-05-2000   FOOT SURGERY Left    HOT HEMOSTASIS N/A 02/04/2023   Procedure: HOT HEMOSTASIS (ARGON PLASMA COAGULATION/BICAP);  Surgeon: Daryel November, MD;  Location: [place];  Service: Gastroenterology;  Laterality: N/A;   I & D EXTREMITY Right 05/24/2013   Procedure: RIGHT INDEX WOUND DEBRIDEMENT AND CLOSURE;  Surgeon: Jolyn Nap, MD;  Location: [place];  Service: Orthopedics;  Laterality: Right;   INSERTION OF SUPRAPUBIC CATHETER N/A 01/14/2015   Procedure: SUPRAPUBIC TUBE PLACEMENT;  Surgeon: Ailene Rud, MD;  Location:  [name];  Service: Urology;  Laterality: N/A;   KNEE ARTHROSCOPY Right 2008   LARYNGOSCOPY AND ESOPHAGOSCOPY  06-13-2010   MARSUPIALIZATION LEFT LARGE VALLECULAR CYST   MASS EXCISION Left 10/22/2015   Procedure: EXCISION MASS OF LEFT INDEX FINGER;  Surgeon: Leanora Cover, MD;  Location: [place];  Service: Orthopedics;  Laterality: Left;   ORCHIECTOMY Left 02/24/2015   Procedure: SCROTAL EXPLORATION WITH LEFT ORCHIECTOMY;  Surgeon: Kathie Rhodes, MD;  Location: [place];  Service: Urology;  Laterality: Left;   PERIPHERAL VASCULAR INTERVENTION Left 02/26/2021   Procedure:  PERIPHERAL VASCULAR INTERVENTION;  Surgeon: Cherre Robins, MD;  Location: [place] [facility];  Service: Cardiovascular;  Laterality: Left;  Superficial femoral   POSTERIOR CERVICAL FUSION/FORAMINOTOMY N/A 01/28/2017   Procedure: POSTERIOR SPINAL FUSION CERVICAL 3-4, CERVICAL 4-5, CERVICAL 5-6, CERVICAL 6-7 WITH INSTRUMENATION AND ALLOGRAFT;  Surgeon: Phylliss Bob, MD;  Location: [place];  Service: Orthopedics;  Laterality: N/A;  POSTERIOR SPINAL FUSION CERVICAL 3-4, CERVICAL 4-5, CERVICAL 5-6, CERVICAL 6-7 WITH INSTRUMENATION AND ALLOGRAFT   SHOULDER ARTHROSCOPY/ DEBRIDEMENT LABRAL TEAR/  BICEPS TENOTOMY Left 09-24-2011   TONSILLECTOMY  as child   TRANSTHORACIC ECHOCARDIOGRAM  02-21-2010   normal LVF/  ef 55-60%/ mild to moderate MR/  moderate LAE/  mild TR   YAG LASER APPLICATION Right [id number]   Procedure: YAG LASER APPLICATION;  Surgeon: Myrtha Mantis., MD;  Location[facility];  Service: Ophthalmology;  Laterality: Right;   YAG LASER CAPSULOTOMY, LEFT EYE  01-08-2011    Prior to Admission medications   Medication Sig Start Date End Date Taking? Authorizing Provider  ALPHAGAN P 0.1 % SOLN Place 1 drop into the right eye 3 (three) times daily. 03/23/22  Yes [provider]  bimatoprost (LUMIGAN) 0.01 % SOLN Place 1 drop into both eyes at bedtime. 04/10/13  Yes Dhungel, Nishant, MD  Brinzolamide-Brimonidine (SIMBRINZA) 1-0.2 % SUSP Place 1 drop into both eyes 3 (three) times daily.   Yes [provider]  magic mouthwash SOLN Take 10 mLs by mouth 3 (three) times daily as needed for mouth pain. 02/27/23  Yes [provider]  Multiple Vitamin (MULTIVITAMIN WITH MINERALS) TABS tablet Take 1 tablet by mouth daily.   Yes [provider]  oxyCODONE-acetaminophen (PERCOCET) 10-325 MG tablet Take 1 tablet by mouth every 8 (eight) hours as needed for up to 7 days for pain. 03/02/23 03/09/23 Yes Hyatt, Max T, DPM  pantoprazole (PROTONIX) 40 MG tablet Take 1 tablet (40 mg  total) by mouth 2 (two) times daily before a meal. 02/06/23  Yes Virl Axe, MD  pravastatin (PRAVACHOL) 80 MG tablet Take 80 mg by mouth daily.  10/19/16  Yes [provider]  tamsulosin (FLOMAX) 0.4 MG CAPS capsule Take 0.4 mg by mouth daily. 10/23/22  Yes [provider]  mirabegron ER (MYRBETRIQ) 25 MG TB24 tablet Take 25 mg by mouth daily. Patient not taking: Reported on 03/04/2023    [provider]  oxybutynin (DITROPAN-XL) 10 MG 24 hr tablet Take 10 mg by mouth daily. 11/22/22   [provider]  PRESCRIPTION MEDICATION Take 1 tablet by mouth at bedtime. Cancer medication.    [provider]    Current Facility-Administered Medications  Medication Dose Route Frequency Provider Last Rate Last Admin   0.9 %  sodium chloride infusion (Manually program via Guardrails IV Fluids)   Intravenous Once Mapp, Tavien, MD       oxyCODONE (Oxy IR/ROXICODONE) immediate release tablet 10 mg  10 mg Oral Q8H PRN Ventura Sellers, [facility]       And   acetaminophen (TYLENOL) tablet 325 mg  325 mg Oral Q8H PRN Ventura Sellers, RPH       cefTRIAXone (ROCEPHIN) 1 g in sodium chloride 0.9 % 100 mL IVPB  1 g Intravenous Q24H Gaylan Gerold, DO       mirabegron ER [facility]) tablet 25 mg  25 mg Oral Daily Gaylan Gerold, DO   25 mg at 03/04/23 [id number]   oxybutynin (DITROPAN-XL) 24 hr tablet 10 mg  10 mg Oral Daily Gaylan Gerold, DO   10 mg at 03/04/23 [id number]   pantoprazole (PROTONIX) injection 40 mg  40 mg Intravenous Q12H Gaylan Gerold, DO   40 mg at 03/04/23 [id number]   pravastatin (PRAVACHOL) tablet 80 mg  80 mg Oral Daily Gaylan Gerold, DO   80 mg at 03/04/23 [id number]   tamsulosin (FLOMAX) capsule 0.4 mg  0.4 mg Oral Daily Gaylan Gerold, DO   0.4 mg at 03/04/23 [id number]   Current Outpatient Medications  Medication Sig Dispense Refill   ALPHAGAN P 0.1 % SOLN Place 1 drop into the right eye 3 (three) times daily.     bimatoprost (LUMIGAN) 0.01 % SOLN Place 1 drop into both eyes at bedtime.  30 mL 3   Brinzolamide-Brimonidine (SIMBRINZA) 1-0.2 % SUSP Place 1 drop into both eyes 3 (three) times daily.     magic mouthwash SOLN Take 10 mLs by mouth 3 (three) times daily as needed for mouth pain.     Multiple Vitamin (MULTIVITAMIN WITH MINERALS) TABS tablet Take 1 tablet by mouth daily.     oxyCODONE-acetaminophen (PERCOCET) 10-325 MG tablet Take 1 tablet by mouth every 8 (eight) hours as needed for up to 7 days for pain. 21 tablet 0   pantoprazole (PROTONIX) 40 MG tablet Take 1 tablet (40 mg total) by mouth 2 (two) times daily before a meal. 90 tablet 0   pravastatin (PRAVACHOL) 80 MG tablet Take 80 mg by mouth daily.   2   tamsulosin (FLOMAX) 0.4 MG CAPS capsule Take 0.4 mg by mouth daily.     mirabegron ER (MYRBETRIQ) 25 MG TB24 tablet Take 25 mg by mouth daily. (Patient not taking: Reported on 03/04/2023)     oxybutynin (DITROPAN-XL) 10 MG 24 hr tablet Take 10 mg by mouth daily.     PRESCRIPTION MEDICATION Take 1 tablet by mouth at bedtime. Cancer medication.      Allergies as of 03/03/2023 - Reviewed 03/03/2023  Allergen Reaction Noted   Ultram [tramadol] Other (See Comments) 04/10/2020    Family History  Problem Relation Age of Onset   Unexplained death Mother        died at a young age, no known cause   Heart attack Father        age unknown   Diabetes Other    Cancer Other     Social History   Socioeconomic History   Marital status: Married    Spouse name: Not on file   Number of children: 3   Years of education: 12   Highest education level: High school graduate  Occupational History   Occupation: Retired - poured concrete  Tobacco Use   Smoking status: Every Day    Packs/day: 1.00    Years: 59.00    Additional pack years: 0.00    Total pack years: 59.00    Types: Cigarettes   Smokeless tobacco: Never   Tobacco comments:  Started Chantix 10/26/16  Vaping Use   Vaping Use: Never used  Substance and Sexual Activity   Alcohol use: No    Comment: none  since approx 2014- heavy before   Drug use: No   Sexual activity: Not on file  Other Topics Concern   Not on file  Social History Narrative   Lives with wife and 2 of his children in a one story home.  Has 3 children.  Retired Counsellor.  Education: high school.   Right Handed   Social Determinants of Health   Financial Resource Strain: Not on file  Food Insecurity: Not on file  Transportation Needs: Not on file  Physical Activity: Not on file  Stress: Not on file  Social Connections: Not on file  Intimate Partner Violence: Not on file    Review of Systems: ROS is O/W negative except as mentioned in HPI.  Physical Exam: Vital signs in last 24 hours: Temp:  [98.1 F (36.7 C)-98.7 F (37.1 C)] 98.1 F (36.7 C) (03/14 0735) Pulse Rate:  [27-88] 52 (03/14 1006) Resp:  [11-20] 17 (03/14 1006) BP: (101-136)/(46-81) 124/63 (03/14 1006) SpO2:  [89 %-100 %] 99 % (03/14 1006)   General:  Alert, Well-developed, well-nourished, pleasant and cooperative in NAD Head:  Normocephalic and atraumatic. Eyes:  Sclera clear, no icterus.  Conjunctiva pink. Ears:  Normal auditory acuity. Mouth:  No deformity or lesions.   Lungs:  Clear throughout to auscultation.  No wheezes, crackles, or rhonchi.  Heart:  Slightly bradycardic but regular rhythm; no murmurs, clicks, rubs, or gallops. Abdomen:  Soft, non-distended.  BS present.  Non-tender.    Msk:  Symmetrical without gross deformities. Pulses:  Normal pulses noted. Extremities:  Without clubbing or edema. Neurologic:  Alert and oriented x 4;  grossly normal neurologically. Skin:  Intact without significant lesions or rashes. Psych:  Alert and cooperative. Normal mood and affect.  Intake/Output from previous day: 03/13 0701 - 03/14 0700 In: -  Out: 260 [Urine:260] Intake/Output this shift: Total I/O In: -  Out: 1100 [Urine:1100]  Lab Results: Recent Labs    03/03/23 1238 03/03/23 1544 03/03/23 2200 03/04/23 0900  WBC  6.1  --   --  6.7  HGB 4.5* 5.1* 6.9* 8.5*  HCT 16.4* 15.0* 21.6* 26.0*  PLT 481*  --   --  379   BMET Recent Labs    03/03/23 1238 03/03/23 1544 03/04/23 0900  NA 136 136 136  K 3.7 3.9 4.0  CL 106 105 108  CO2 19*  --  20*  GLUCOSE 111* 97 85  BUN 33* 30* 23  CREATININE 1.61* 1.70* 1.28*  CALCIUM 8.9  --  8.5*   LFT Recent Labs    03/04/23 0900  PROT 5.7*  ALBUMIN 3.1*  AST 13*  ALT 12  ALKPHOS 35*  BILITOT 0.7   PT/INR Recent Labs    03/04/23 0900  LABPROT 13.2  INR 1.0   Studies/Results: CT ANGIO GI BLEED  Result Date: 03/03/2023 CLINICAL DATA:  GI bleed, melena, hemoglobin 4 EXAM: CTA ABDOMEN AND PELVIS WITHOUT AND WITH CONTRAST TECHNIQUE: Multidetector CT imaging of the abdomen and pelvis was performed using the standard protocol during bolus administration of intravenous contrast. Multiplanar reconstructed images and MIPs were obtained and reviewed to evaluate the vascular anatomy. RADIATION DOSE REDUCTION: This exam was performed according to the departmental dose-optimization program which includes automated exposure control, adjustment of the mA and/or kV according to patient size and/or use of iterative  reconstruction technique. CONTRAST:  58m OMNIPAQUE IOHEXOL 350 MG/ML SOLN COMPARISON:  CT abdomen pelvis, 02/13/2022 FINDINGS: VASCULAR Normal contour and caliber of the abdominal aorta. No evidence of aneurysm, dissection, or other acute aortic pathology. Standard branching pattern of the abdominal aorta, with solitary bilateral renal arteries. Atherosclerosis of the branch vessel origins without high-grade stenosis. Review of the MIP images confirms the above findings. NON-VASCULAR Lower chest: No acute abnormality. Hepatobiliary: No solid liver abnormality is seen. Simple benign cyst of the right lobe of the liver, for which no further follow-up or characterization is required. No gallstones, gallbladder wall thickening, or biliary dilatation. Pancreas:  Unremarkable. No pancreatic ductal dilatation or surrounding inflammatory changes. Spleen: Normal in size without significant abnormality. Adrenals/Urinary Tract: Adrenal glands are unremarkable. Multiple simple, benign bilateral renal cortical cysts, for which no further follow-up or characterization is required. Kidneys are otherwise normal, without renal calculi, solid lesion, or hydronephrosis. Bladder is unremarkable. Stomach/Bowel: Stomach is within normal limits. Appendix appears normal. No evidence of bowel wall thickening, distention, or inflammatory changes. Descending and sigmoid diverticulosis. No intraluminal contrast extravasation or other findings to localize nidus of GI bleeding. Lymphatic: No enlarged abdominal or pelvic lymph nodes. Reproductive: No mass or other significant abnormality. Other: No abdominal wall hernia or abnormality. No ascites. Musculoskeletal: No acute or significant osseous findings. IMPRESSION: 1. No intraluminal contrast extravasation or other findings to localize nidus of GI bleeding. 2. Descending and sigmoid diverticulosis without evidence of acute diverticulitis. 3. Normal contour and caliber of the abdominal aorta. No evidence of aneurysm, dissection, or other acute aortic findings. Severe mixed calcific atherosclerosis. Aortic Atherosclerosis (ICD10-I70.0). Electronically Signed   By: ADelanna AhmadiM.D.   On: 03/03/2023 17:29    IMPRESSION:  *82year old male with recurrent syncope with IDA with reported melenic stools x 6 months.  Was admitted last month for anemia, transfused, and underwent EGD with AVMs noted in the stomach and duodenum to which APC was applied.  Presenting again with weakness.  Hemoglobin again 4.5 g, received 3 units of packed red blood cells.  Hemoglobin increased to 8.5 g.  Suspect AVMs possibly in the colon and/or small bowel.  BUN normal.  *PAD, on ASA. No longer on Plavix.   *CHF, LV EF 45 - 50% per ECHO 01/2022  *UTI: E. coli and  staph seen on urine cultures.  On ceftriaxone.  PLAN: -Is on pantoprazole 40 mg IV twice daily.  Continue it for now. -Likely needs enteroscopy and colonoscopy, timing TBD, but planning for 3/16 diet orders have all been entered as appropriate. -Continue to monitor hemoglobin and transfuse further if needed.  JLaban Emperor Arseniy Toomey  03/04/2023, 10:50 AM

## 2023-03-04 NOTE — ED Notes (Signed)
Pt c/o arm pain. Pt offered pain meds, declined at this time. Pt upset that he cannot have coffee.

## 2023-03-04 NOTE — Hospital Course (Addendum)
This is a 82 year old male with a past medical history of iron deficiency anemia, history of GI bleed, presents with melanotic stools.  Patient admitted for further evaluation management of suspected GI bleed.   #Symptomatic acute blood loss anemia #Suspected upper GI bleed Patient initially presented with hemoglobin of 4.5.  Patient also has symptoms of lightheadedness and dizziness.  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  Patient continues to have melena.  He denies any overnight bowel movements.  Patient has had 3 blood transfusions during stay so far.  Will continue to monitor CBC.  Posttransfusion hemoglobin 8.5.  GI following patient who might scope the patient in the next day or two.  CT angio GI showing no obvious brisk bleeds. Patient iron deficit greater than 1500.  Will proceed with IV infusions during hospitalization.  Patient does remain hemodynamically stable at this point. -Continue Protonix 40 mg IV twice daily -Start Ferrlecit 250 mg daily -Monitor CBC -GI following, appreciate recs -Clear liquid diet   #Urinary tract infection UA showing positive nitrites and large leukocytes with pyuria.  Patient having symptoms consistent with urinary tract infection.  Previous hospitalization, patient was treated with 3 days with ceftriaxone.  Patient started on ceftriaxone during hospitalization here.  Will continue to follow urine cultures. -Follow cultures -Ceftriaxone day 2   #BPH #Urinary incontinence Patient remains on tamsulosin, Myrbetriq, and oxybutynin at home.  Patient also has recurrent urinary tract infections.  Patient could benefit from urology follow-up outpatient.  Patient did have bladder scan today showing greater than 800 cc of urine retention, will in and out today. -Continue tamsulosin -Continue Myrbetriq -Continue to hold oxybutynin -In and out cath   #PAD Patient is to be on  Plavix at home for PAD, but was stopped last hospitalization for suspected GI bleed.  Patient remains on pravastatin.  No acute concerns at this time. -Do not start Plavix -Continue pravastatin   #Chronic pain Patient chronic pain and takes Percocet in the outpatient setting.  No acute concerns. -Continue Percocet as needed   #CKD 3A No acute concerns during hospitalization  3/15 Says yes to having bloody bowel movements, x2 yesterday then contradicts this and says no bowel movements No abdominal pain, N/V, sob The doctor overnight messed him up he says They woke him up to draw blood, upset about this Didn't eat, says the food isnt good and its for sick people Says he had bleeding from the catheter Understands when explained why drawing labs

## 2023-03-04 NOTE — Progress Notes (Signed)
Foley catheter insertion attempt discontinued as pt began to bleed during 37f foley catheterization. Md notified

## 2023-03-05 DIAGNOSIS — N3090 Cystitis, unspecified without hematuria: Secondary | ICD-10-CM

## 2023-03-05 LAB — CBC
HCT: 25.6 % — ABNORMAL LOW (ref 39.0–52.0)
Hemoglobin: 8 g/dL — ABNORMAL LOW (ref 13.0–17.0)
MCH: 26.4 pg (ref 26.0–34.0)
MCHC: 31.3 g/dL (ref 30.0–36.0)
MCV: 84.5 fL (ref 80.0–100.0)
Platelets: 365 10*3/uL (ref 150–400)
RBC: 3.03 MIL/uL — ABNORMAL LOW (ref 4.22–5.81)
RDW: 20.4 % — ABNORMAL HIGH (ref 11.5–15.5)
WBC: 9.3 10*3/uL (ref 4.0–10.5)
nRBC: 0.2 % (ref 0.0–0.2)

## 2023-03-05 LAB — BASIC METABOLIC PANEL
Anion gap: 7 (ref 5–15)
BUN: 14 mg/dL (ref 8–23)
CO2: 21 mmol/L — ABNORMAL LOW (ref 22–32)
Calcium: 8.3 mg/dL — ABNORMAL LOW (ref 8.9–10.3)
Chloride: 106 mmol/L (ref 98–111)
Creatinine, Ser: 1.18 mg/dL (ref 0.61–1.24)
GFR, Estimated: >60 mL/min (ref >60)
Glucose, Bld: 89 mg/dL (ref 70–99)
Potassium: 4 mmol/L (ref 3.5–5.1)
Sodium: 134 mmol/L — ABNORMAL LOW (ref 135–145)

## 2023-03-05 MED ORDER — NICOTINE 14 MG/24HR TD PT24
14.0000 mg | MEDICATED_PATCH | Freq: Every day | TRANSDERMAL | Status: DC
  Administered 2023-03-05 – 2023-03-07 (×3): 14 mg via TRANSDERMAL
  Filled 2023-03-05 (×3): qty 1

## 2023-03-05 MED ORDER — ENSURE ENLIVE PO LIQD
237.0000 mL | Freq: Three times a day (TID) | ORAL | Status: DC
  Administered 2023-03-05 – 2023-03-07 (×4): 237 mL via ORAL

## 2023-03-05 NOTE — Progress Notes (Addendum)
Initial Nutrition Assessment  DOCUMENTATION CODES:   Severe malnutrition in context of acute illness/injury  INTERVENTION:  Ensure Enlive po TID, each supplement provides 350 kcal and 20 grams of protein. Education on adequate nutrition and protein supplementation  RD recommends a regular low fiber diet when clinically appropriate   NUTRITION DIAGNOSIS:   Severe Malnutrition related to acute illness, altered GI function as evidenced by per patient/family report, severe fat depletion, severe muscle depletion, energy intake < or equal to 50% for > or equal to 5 days.   GOAL:   Patient will meet greater than or equal to 90% of their needs   MONITOR:   Labs, PO intake, Supplement acceptance, Weight trends  REASON FOR ASSESSMENT:   Malnutrition Screening Tool    ASSESSMENT:   82 y.o male with PMHx of DM2, iron deficiency anemia, PAD, CKD 3a, urinary issues presents with abdominal pain and melena. Patient admitted for upper GI bleed.  Per chart review, patient treated for syncope and symptomatic anemia from upper GIB a month ago. 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 Patient has been on PPI therapy with persistent hematemesis and hematochezia. Patient received 3 units of blood in ED when his hgb was 4.5 per GI.   Visited patient at bedside who was eating 2 ice cream cups at time of visit. He reports he has an appetite now but has not had one for a few months now due to GI issues. Patient admits to eating 1 meal per day which is normally supper (includes meat, starch and vegetable). Patient reports that is all he is hungry for during the day. Patient lives at home with his wife and daughter who reports do all the cooking since he cannot anymore.   Patient reports constipation and denies nausea, vomiting and diarrhea. He reports a UBW of 160#. Patient reports drinking Ensure at home and prefers vanilla. He is agreeable to getting them TID.  Patient is edentulous and reports his dentures fit but are not here with him. He reports he will ask his wife to bring them.   Labs: reviewed Meds: ferrlecit, protonix  Wt: 32.8# (14.9 kg) wt loss x 1 year; CBW 132# (59.9 kg)  Po: clear liquid diet, NPO at midnight  I/O's: -1L     NUTRITION - FOCUSED PHYSICAL EXAM:  Flowsheet Row Most Recent Value  Orbital Region Severe depletion  Upper Arm Region Severe depletion  Thoracic and Lumbar Region Severe depletion  Buccal Region Severe depletion  Temple Region Severe depletion  Clavicle Bone Region Severe depletion  Clavicle and Acromion Bone Region Severe depletion  Scapular Bone Region Severe depletion  Dorsal Hand Moderate depletion  Patellar Region Severe depletion  Anterior Thigh Region Severe depletion  Posterior Calf Region Severe depletion  Edema (RD Assessment) None  Hair Reviewed  Eyes Reviewed  Mouth Reviewed  [edentulous, but wears dentures]  Skin Reviewed  Nails Reviewed  [brown pigmentation on some nails]       Diet Order:   Diet Order             Diet NPO time specified  Diet effective midnight           Diet clear liquid Room service appropriate? Yes; Fluid consistency: Thin  Diet effective now                   EDUCATION NEEDS:   Education needs have been addressed  Skin:  Skin Assessment: Reviewed RN Assessment  Last BM:  PTA  Height:   Ht Readings from Last 1 Encounters:  02/04/23 6\' 2"  (1.88 m)    Weight:   Wt Readings from Last 1 Encounters:  03/05/23 59.9 kg    Ideal Body Weight:     BMI:  Body mass index is 16.95 kg/m.  Estimated Nutritional Needs:   Kcal:  2000-2400 kcal/day  Protein:  72-90 g protein /day  Fluid:  >/= 2L    Trey Paula, RDN, LDN  Clinical Nutrition

## 2023-03-05 NOTE — Progress Notes (Signed)
HD#2 Subjective:   Summary: This is a 82 year old male with a past medical history of iron deficiency anemia, history of GI bleed, presents with melanotic stools.  Patient admitted for further evaluation management of suspected GI bleed.  Overnight Events: Overnight, patient was bladder scanned, shown to have urinary retention, Foley attempted, met with obstruction, coud placed with hematuria.   Patient slightly agitated upon my exam.  Stating that he does not want to give me labs.  He does not like the food.  He denies having any bowel movements.  He does note hematuria.  He states he would like real food.  Discussion held with patient about the importance of sticking to clear liquid diet and getting labs.  Patient does verbalize understanding.  Patient agreeable to get labs.  Objective:  Vital signs in last 24 hours: Vitals:   03/04/23 2350 03/05/23 0400 03/05/23 0401 03/05/23 0843  BP: (!) 104/54 (!) 107/52  128/66  Pulse: 62 (!) 55  (!) 51  Resp: 16 16  18   Temp: 98.4 F (36.9 C) 98.3 F (36.8 C)  98.1 F (36.7 C)  TempSrc: Oral Oral  Oral  SpO2: 100% 100%  (!) 75%  Weight:   59.9 kg    Supplemental O2: Room Air SpO2: (!) 75 %   Physical Exam:  Constitutional: Resting in bed, slightly agitated, no acute distress HENT: Normocephalic, atraumatic Eyes: Pale conjunctiva noted Cardiovascular: regular rate and rhythm, no m/r/g Pulmonary/Chest: normal work of breathing on room air, lungs clear to auscultation bilaterally Abdominal: Soft, nontender, normal active bowel sounds   Intake/Output Summary (Last 24 hours) at 03/05/2023 1142 Last data filed at 03/05/2023 0944 Gross per 24 hour  Intake 1266.64 ml  Output 1100 ml  Net 166.64 ml   Net IO Since Admission: -1,193.36 mL [03/05/23 1142]  Pertinent Labs:    Latest Ref Rng & Units 03/04/2023    9:00 AM 03/03/2023   10:00 PM 03/03/2023    3:44 PM  CBC  WBC 4.0 - 10.5 K/uL 6.7     Hemoglobin 13.0 - 17.0 g/dL 8.5  6.9   5.1   Hematocrit 39.0 - 52.0 % 26.0  21.6  15.0   Platelets 150 - 400 K/uL 379          Latest Ref Rng & Units 03/04/2023    9:00 AM 03/03/2023    3:44 PM 03/03/2023   12:38 PM  CMP  Glucose 70 - 99 mg/dL 85  97  111   BUN 8 - 23 mg/dL 23  30  33   Creatinine 0.61 - 1.24 mg/dL 1.28  1.70  1.61   Sodium 135 - 145 mmol/L 136  136  136   Potassium 3.5 - 5.1 mmol/L 4.0  3.9  3.7   Chloride 98 - 111 mmol/L 108  105  106   CO2 22 - 32 mmol/L 20   19   Calcium 8.9 - 10.3 mg/dL 8.5   8.9   Total Protein 6.5 - 8.1 g/dL 5.7   6.2   Total Bilirubin 0.3 - 1.2 mg/dL 0.7   0.2   Alkaline Phos 38 - 126 U/L 35   37   AST 15 - 41 U/L 13   14   ALT 0 - 44 U/L 12   13     Imaging: No results found.  Assessment/Plan:   Principal Problem:   Upper GI bleed Active Problems:   Peripheral arterial disease (HCC)   CKD (chronic  kidney disease) stage 3, GFR 30-59 ml/min (HCC) - baseline SCr 2.0   Symptomatic anemia   Patient Summary:  This is a 82 year old male with a past medical history of iron deficiency anemia, history of GI bleed, presents with melanotic stools.  Patient admitted for further evaluation management of suspected GI bleed.  #Symptomatic acute blood loss anemia #Suspected upper GI bleed Patient is status post 3 transfusions during hospitalization.  GI following, recommending enteroscopy and colonoscopy tomorrow 03/06/2023.  Patient states today that he has not had any bowel movements.  Could mean that bleeding has stopped, or has slowed down.  Patient remains hemodynamically stable.  Continue to monitor patient. -Continue Ferrlecit 250 mg daily -Continue Protonix 40 mg IV twice daily -Continue monitor CBC -GI following, scoping tomorrow 03/16/724 -Continue on clear liquid diet, n.p.o. at midnight   #Simple cystitis Will continue to treat for cystitis.  Urine culture was unable to be obtained as specimen misplaced, and as patient has had antibiotic treatment already, not sure  what utility new urine culture will show.  Will treat as simple cystitis. -Ceftriaxone day 3  #BPH #Urinary incontinence #Hematuria Held Myrbetriq and oxybutynin yesterday.  Patient remains on tamsulosin.  Yesterday, patient had urinary retention, requiring Foley, but when Foley was tried to be placed, obstruction was met.  Coud placed, with hematuria.  Likely traumatic.  Will continue to monitor.  Patient will benefit from urology outpatient. -Continue tamsulosin -Monitor daily catheter output Hold Myrbetriq and oxybutynin -Flush catheter  #PAD Patient has history of PAD.  Holding Plavix.  Continue pravastatin. #Continue pravastatin  #Chronic pain Patient chronic pain and takes Percocet in the outpatient setting.  No acute concerns. -Continue Percocet as needed  #CKD 3A No acute concerns during hospitalization  Diet: Clear liquid IVF: None,None VTE: SCDs Code: Full PT/OT recs: None, none.  Dispo: Anticipated discharge to Home in 3 days pending clinical improvement.   [facility] Resident PGY-1 [phone] Please contact the on call pager after 5 pm and on weekends at [phone].

## 2023-03-05 NOTE — Progress Notes (Signed)
Pt refused lab draws twice this morning and stated "No one is taking his blood until he eats." Lab put in a request to come later after pt has ate.

## 2023-03-06 ENCOUNTER — Inpatient Hospital Stay (HOSPITAL_COMMUNITY): Payer: 59 | Admitting: Anesthesiology

## 2023-03-06 ENCOUNTER — Encounter (HOSPITAL_COMMUNITY)
Admission: EM | Disposition: A | Discharge status: Home / Self Care | Payer: Self-pay | Source: Home / Self Care | Attending: Internal Medicine

## 2023-03-06 ENCOUNTER — Encounter (HOSPITAL_COMMUNITY): Payer: Self-pay | Admitting: Internal Medicine

## 2023-03-06 DIAGNOSIS — I10 Essential (primary) hypertension: Secondary | ICD-10-CM

## 2023-03-06 DIAGNOSIS — K31819 Angiodysplasia of stomach and duodenum without bleeding: Secondary | ICD-10-CM

## 2023-03-06 DIAGNOSIS — E43 Unspecified severe protein-calorie malnutrition: Secondary | ICD-10-CM | POA: Insufficient documentation

## 2023-03-06 DIAGNOSIS — D630 Anemia in neoplastic disease: Secondary | ICD-10-CM

## 2023-03-06 DIAGNOSIS — K5520 Angiodysplasia of colon without hemorrhage: Secondary | ICD-10-CM

## 2023-03-06 DIAGNOSIS — D126 Benign neoplasm of colon, unspecified: Secondary | ICD-10-CM

## 2023-03-06 DIAGNOSIS — K641 Second degree hemorrhoids: Secondary | ICD-10-CM

## 2023-03-06 DIAGNOSIS — K449 Diaphragmatic hernia without obstruction or gangrene: Secondary | ICD-10-CM

## 2023-03-06 DIAGNOSIS — I2510 Atherosclerotic heart disease of native coronary artery without angina pectoris: Secondary | ICD-10-CM

## 2023-03-06 DIAGNOSIS — D127 Benign neoplasm of rectosigmoid junction: Secondary | ICD-10-CM

## 2023-03-06 DIAGNOSIS — D122 Benign neoplasm of ascending colon: Secondary | ICD-10-CM

## 2023-03-06 DIAGNOSIS — K5730 Diverticulosis of large intestine without perforation or abscess without bleeding: Secondary | ICD-10-CM

## 2023-03-06 DIAGNOSIS — K922 Gastrointestinal hemorrhage, unspecified: Secondary | ICD-10-CM

## 2023-03-06 DIAGNOSIS — K3189 Other diseases of stomach and duodenum: Secondary | ICD-10-CM

## 2023-03-06 DIAGNOSIS — F17200 Nicotine dependence, unspecified, uncomplicated: Secondary | ICD-10-CM

## 2023-03-06 DIAGNOSIS — K635 Polyp of colon: Secondary | ICD-10-CM

## 2023-03-06 HISTORY — PX: COLONOSCOPY: SHX5424

## 2023-03-06 HISTORY — PX: HOT HEMOSTASIS: SHX5433

## 2023-03-06 HISTORY — PX: POLYPECTOMY: SHX5525

## 2023-03-06 HISTORY — PX: SUBMUCOSAL TATTOO INJECTION: SHX6856

## 2023-03-06 HISTORY — PX: ENTEROSCOPY: SHX5533

## 2023-03-06 LAB — BASIC METABOLIC PANEL
Anion gap: 8 (ref 5–15)
BUN: 12 mg/dL (ref 8–23)
CO2: 22 mmol/L (ref 22–32)
Calcium: 8.3 mg/dL — ABNORMAL LOW (ref 8.9–10.3)
Chloride: 107 mmol/L (ref 98–111)
Creatinine, Ser: 1.1 mg/dL (ref 0.61–1.24)
GFR, Estimated: >60 mL/min (ref >60)
Glucose, Bld: 105 mg/dL — ABNORMAL HIGH (ref 70–99)
Potassium: 4 mmol/L (ref 3.5–5.1)
Sodium: 137 mmol/L (ref 135–145)

## 2023-03-06 LAB — CBC
HCT: 26.2 % — ABNORMAL LOW (ref 39.0–52.0)
Hemoglobin: 8.1 g/dL — ABNORMAL LOW (ref 13.0–17.0)
MCH: 26.2 pg (ref 26.0–34.0)
MCHC: 30.9 g/dL (ref 30.0–36.0)
MCV: 84.8 fL (ref 80.0–100.0)
Platelets: 371 10*3/uL (ref 150–400)
RBC: 3.09 MIL/uL — ABNORMAL LOW (ref 4.22–5.81)
RDW: 20.9 % — ABNORMAL HIGH (ref 11.5–15.5)
WBC: 6.7 10*3/uL (ref 4.0–10.5)
nRBC: 0.4 % — ABNORMAL HIGH (ref 0.0–0.2)

## 2023-03-06 SURGERY — ENTEROSCOPY
Anesthesia: Monitor Anesthesia Care

## 2023-03-06 MED ORDER — SUCRALFATE 1 G PO TABS
1.0000 g | ORAL_TABLET | Freq: Two times a day (BID) | ORAL | Status: DC
  Administered 2023-03-06 – 2023-03-07 (×3): 1 g via ORAL
  Filled 2023-03-06 (×3): qty 1

## 2023-03-06 MED ORDER — PANTOPRAZOLE SODIUM 40 MG PO TBEC
40.0000 mg | DELAYED_RELEASE_TABLET | Freq: Two times a day (BID) | ORAL | Status: DC
  Administered 2023-03-06 – 2023-03-07 (×3): 40 mg via ORAL
  Filled 2023-03-06 (×3): qty 1

## 2023-03-06 MED ORDER — CHLORHEXIDINE GLUCONATE CLOTH 2 % EX PADS
6.0000 | MEDICATED_PAD | Freq: Every day | CUTANEOUS | Status: DC
  Administered 2023-03-06: 6 via TOPICAL

## 2023-03-06 MED ORDER — LACTATED RINGERS IV SOLN: Freq: Once | INTRAVENOUS | Status: AC

## 2023-03-06 MED ORDER — LIDOCAINE 2% (20 MG/ML) 5 ML SYRINGE
INTRAMUSCULAR | Status: DC | PRN
  Administered 2023-03-06: 60 mg via INTRAVENOUS

## 2023-03-06 MED ORDER — SUCRALFATE 1 G PO TABS: 1.0000 g | ORAL_TABLET | Freq: Two times a day (BID) | ORAL | 0 refills | Status: DC

## 2023-03-06 MED ORDER — PROPOFOL 500 MG/50ML IV EMUL
INTRAVENOUS | Status: DC | PRN
  Administered 2023-03-06: 100 ug/kg/min via INTRAVENOUS

## 2023-03-06 MED ORDER — SPOT INK MARKER SYRINGE KIT
PACK | SUBMUCOSAL | Status: DC | PRN
  Administered 2023-03-06: 1 mL via SUBMUCOSAL

## 2023-03-06 MED ORDER — LACTATED RINGERS IV SOLN: INTRAVENOUS | Status: DC

## 2023-03-06 MED ORDER — PHENYLEPHRINE 80 MCG/ML (10ML) SYRINGE FOR IV PUSH (FOR BLOOD PRESSURE SUPPORT)
PREFILLED_SYRINGE | INTRAVENOUS | Status: DC | PRN
  Administered 2023-03-06: 80 ug via INTRAVENOUS

## 2023-03-06 MED ORDER — GLYCOPYRROLATE PF 0.2 MG/ML IJ SOSY
PREFILLED_SYRINGE | INTRAMUSCULAR | Status: DC | PRN
  Administered 2023-03-06: .2 mg via INTRAVENOUS

## 2023-03-06 MED ORDER — POLYSACCHARIDE IRON COMPLEX 150 MG PO CAPS: 150.0000 mg | ORAL_CAPSULE | ORAL | 0 refills | Status: DC

## 2023-03-06 MED ORDER — PHENYLEPHRINE HCL-NACL 20-0.9 MG/250ML-% IV SOLN
INTRAVENOUS | Status: DC | PRN
  Administered 2023-03-06: 25 ug/min via INTRAVENOUS

## 2023-03-06 MED ORDER — PANTOPRAZOLE SODIUM 40 MG PO TBEC: 40.0000 mg | DELAYED_RELEASE_TABLET | Freq: Two times a day (BID) | ORAL | 0 refills | Status: DC

## 2023-03-06 MED ORDER — SODIUM CHLORIDE 0.9 % IV SOLN: INTRAVENOUS | Status: DC

## 2023-03-06 MED ORDER — SPOT INK MARKER SYRINGE KIT
PACK | SUBMUCOSAL | Status: AC
  Filled 2023-03-06: qty 5

## 2023-03-06 NOTE — Op Note (Signed)
[facility] Patient Name: Russell [name] Procedure Date : 03/06/2023 MRN: [id number] Attending MD: Justice Britain , MD, [id number] Date of Birth: 12/29/1940 CSN: [id number] Age: 82 Admit Type: Inpatient Procedure:                Small bowel enteroscopy Indications:              Melena, Occult blood in stool, Arteriovenous                            malformation in the stomach Providers:                Justice Britain, MD, Grace Isaac, RN, Darliss Cheney, Technician Referring MD:             Inpatient medical service Medicines:                Monitored Anesthesia Care Complications:            No immediate complications. Estimated Blood Loss:     Estimated blood loss was minimal. Procedure:                Pre-Anesthesia Assessment:                           - Prior to the procedure, a History and Physical                            was performed, and patient medications and                            allergies were reviewed. The patient's tolerance of                            previous anesthesia was also reviewed. The risks                            and benefits of the procedure and the sedation                            options and risks were discussed with the patient.                            All questions were answered, and informed consent                            was obtained. Prior Anticoagulants: The patient has                            taken no anticoagulant or antiplatelet agents. ASA                            Grade Assessment: III - A patient with severe  systemic disease. After reviewing the risks and                            benefits, the patient was deemed in satisfactory                            condition to undergo the procedure.                           After obtaining informed consent, the endoscope was                            passed under direct vision. Throughout the                             procedure, the patient's blood pressure, pulse, and                            oxygen saturations were monitored continuously. The                            [id number] Olympus colonoscope was                            introduced through the mouth and advanced to the                            proximal jejunum. The small bowel enteroscopy was                            accomplished without difficulty. The patient                            tolerated the procedure. Scope In: Scope Out: Findings:      No gross lesions were noted in the entire esophagus.      The Z-line was regular and was found 40 cm from the incisors.      A 2 cm hiatal hernia was present.      Four small angioectasias with no bleeding were found in the cardia, in       the gastric fundus and in the gastric body. Fulguration to ablate the       lesion to prevent bleeding by argon plasma was successful.      Patchy mildly erythematous mucosa without bleeding was found in the       entire examined stomach.      Normal mucosa was found in the entire duodenum.      Four angioectasias with no bleeding were found in the proximal jejunum.       Fulguration to ablate the lesion to prevent bleeding by argon plasma was       successful.      Normal mucosa was found in the rest of the visualized proximal jejunum.       Area was tattooed with an injection of Spot (carbon black) to demarcate       the distal extent of today's enteroscopy. Impression:               -  No gross lesions in the entire esophagus. Z-line                            regular, 40 cm from the incisors.                           - 2 cm hiatal hernia.                           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                            Treated with argon plasma coagulation (APC).                           - Erythematous mucosa in the stomach.                           - Normal mucosa was found in the entire examined                             duodenum.                           - Four non-bleeding angioectasias in the jejunum.                            Treated with argon plasma coagulation (APC).                           - Normal mucosa was found in the rest of the                            visualized proximal jejunum. Tattooed distal extent. Recommendation:           - Proceed to scheduled colonoscopy.                           - PPI transition from IV to oral. 40 mg twice daily                            for 1 month and then 40 mg once daily.                           - Carafate twice daily for 2 weeks.                           - The findings and recommendations were discussed                            with the patient.                           - The findings and recommendations were discussed  with the referring physician. Procedure Code(s):        --- Professional ---                           [phone], Small intestinal endoscopy, enteroscopy                            beyond second portion of duodenum, not including                            ileum; with control of bleeding (eg, injection,                            bipolar cautery, unipolar cautery, laser, heater                            probe, stapler, plasma coagulator)                           44799, Unlisted procedure, small intestine Diagnosis Code(s):        --- Professional ---                           K44.9, Diaphragmatic hernia without obstruction or                            gangrene                           K31.819, Angiodysplasia of stomach and duodenum                            without bleeding                           K31.89, Other diseases of stomach and duodenum                           K55.20, Angiodysplasia of colon without hemorrhage                           K92.1, Melena (includes Hematochezia)                           R19.5, Other fecal abnormalities CPT copyright 2022 [employer]  [employer] Association. All rights reserved. The codes documented in this report are preliminary and upon coder review may  be revised to meet current compliance requirements. Justice Britain, MD 03/06/2023 12:08:57 PM Number of Addenda: 0

## 2023-03-06 NOTE — Anesthesia Postprocedure Evaluation (Signed)
Anesthesia Post Note  Patient: Russell [name]  Procedure(s) Performed: ENTEROSCOPY COLONOSCOPY HOT HEMOSTASIS (ARGON PLASMA COAGULATION/BICAP) SUBMUCOSAL TATTOO INJECTION POLYPECTOMY     Patient location during evaluation: Endoscopy Anesthesia Type: MAC Level of consciousness: oriented, awake and alert and awake Pain management: pain level controlled Vital Signs Assessment: post-procedure vital signs reviewed and stable Respiratory status: spontaneous breathing, nonlabored ventilation, respiratory function stable and patient connected to nasal cannula oxygen Cardiovascular status: blood pressure returned to baseline and stable Postop Assessment: no headache, no backache and no apparent nausea or vomiting Anesthetic complications: no   No notable events documented.  Last Vitals:  Vitals:   03/06/23 1218 03/06/23 1523  BP:  139/68  Pulse: 68 60  Resp: 19 18  Temp:  36.6 C  SpO2: 100% 100%    Last Pain:  Vitals:   03/06/23 1523  TempSrc: Oral  PainSc:                  [name]

## 2023-03-06 NOTE — Discharge Summary (Incomplete)
Name: Russell [name] MRN: [id number] DOB: 06-24-1941 82 y.o. PCP: Marcie Mowers, FNP  Date of Admission: 03/03/2023 11:30 AM Date of Discharge: 03/07/2023 Attending Physician: Dr. Charise Killian   Discharge Diagnosis: Principal Problem:   Upper GI bleed Active Problems:   Peripheral arterial disease (HCC)   CKD (chronic kidney disease) stage 3, GFR 30-59 ml/min (HCC) - baseline SCr 2.0   Symptomatic anemia   Protein-calorie malnutrition, severe   Urinary retention    Discharge Medications: Allergies as of 03/07/2023       Reactions   Ultram [tramadol] Other (See Comments)   Confusion         Medication List     STOP taking these medications    multivitamin with minerals Tabs tablet   Myrbetriq 25 MG Tb24 tablet Generic drug: mirabegron ER   oxybutynin 10 MG 24 hr tablet Commonly known as: DITROPAN-XL   PRESCRIPTION MEDICATION       TAKE these medications    Alphagan P 0.1 % Soln Generic drug: brimonidine Place 1 drop into the right eye 3 (three) times daily.   bimatoprost 0.01 % Soln Commonly known as: LUMIGAN Place 1 drop into both eyes at bedtime.   iron polysaccharides 150 MG capsule Commonly known as: NIFEREX Take 1 capsule (150 mg total) by mouth every Monday, Wednesday, and Friday. Start taking on: March 08, 2023   magic mouthwash Soln Take 10 mLs by mouth 3 (three) times daily as needed for mouth pain.   oxyCODONE-acetaminophen 10-325 MG tablet Commonly known as: Percocet Take 1 tablet by mouth every 8 (eight) hours as needed for up to 7 days for pain.   pantoprazole 40 MG tablet Commonly known as: PROTONIX Take 1 tablet (40 mg total) by mouth 2 (two) times daily. What changed: when to take this   pravastatin 80 MG tablet Commonly known as: PRAVACHOL Take 80 mg by mouth daily.   Simbrinza 1-0.2 % Susp Generic drug: Brinzolamide-Brimonidine Place 1 drop into both eyes 3 (three) times daily.   sucralfate 1 g tablet Commonly  known as: CARAFATE Take 1 tablet (1 g total) by mouth 2 (two) times daily for 14 days.   tamsulosin 0.4 MG Caps capsule Commonly known as: FLOMAX Take 0.4 mg by mouth daily.        Disposition and follow-up:   Russell [name] was discharged from [facility] in Good condition.  At the hospital follow up visit please address:  1.  Follow-up:  a.  Symptomatic acute blood loss anemia secondary to upper GI bleed: Patient was started on 1 month of bid pantoprazole, 2 weeks of sucralfate and iron supplementation.  He is advised to hold Plavix until he has repeat CBC with his PCP in one week.    b.  Urinary retention: Patient had urinary retention during hospitalization.  He has a Foley catheter at discharge.  Patient also has history of BPH.  Ensure patient has referral to urology.   c.  Cystitis: Ensure patient's urinary symptoms have resolved  2.  Labs / imaging needed at time of follow-up: CBC, iron studies  3.  Pending labs/ test needing follow-up: Pathology report  4.  Medication Changes  Patient discharged on Protonix, iron supplementation, and Carafate.  Follow-up Appointments:  Follow-up Information     Marcie Mowers, FNP. Call in 1 day(s).   Specialty: Family Medicine Why: Please call your PCP to make an appointment to follow-up in 1 week Contact information: [id number] [place] [address] [phone]         Daryel November, MD. Call in 1 day(s).   Specialty: Gastroenterology Why: To make appointment for follow-up Contact information: [place] [address] [address]. Call in 1 week(s).   Contact information: [place] [place] [facility] Course by problem list: This is a 82 year old male with a past medical history of iron deficiency anemia, history of GI bleed, presents with melanotic  stools.  Patient admitted for further evaluation management of suspected GI bleed.   #Symptomatic acute blood loss anemia #Suspected upper GI bleed Patient initially presented to the emergency room with melanotic stools.  Hemoglobin initially 4.5.  Patient had transfusion during hospitalization x 3.  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  Hemoglobin remained stable after transfusion.  Patient started on Protonix 40 mg IV twice daily and transition eventually to 40 mg twice daily oral.  Patient to be discharged on Carafate twice daily for two weeks and Protonix 40 mg twice daily for the next month followed by once daily.  Patient also received IV iron infusions during hospitalization. Discharged on oral iron.   #Cystitis Patient admitted, UA showing evidence of nitrites and leukocytes.  Patient on ceftriaxone and finished course during hospitalization.    #BPH #Urinary incontinence #Hematuria Patient initially presented with urinary retention and home Myrbetriq and oxybutynin were held.  Patient remained on tamsulosin.  Patient continued to retain, and had to place coud catheter.  Patient failed voiding trial during hospitalization.  Patient to be discharged with Foley catheter in place and on tamsulosin.  Will hold Myrbetriq and oxybutynin on discharge.   #PAD Patient remained on pravastatin during hospitalization no acute concerns.  Not a good candidate for Plavix given GI bleeds, discuss need for further treatment at PCP f/u in one week.   #Chronic pain Patient continue Percocet during hospitalization.  #CKD 3A No acute concerns during hospitalization    Discharge Subjective: Patient resting in bed upon my exam.  He said he is feeling well.  Good p.o. intake and bowel movements.  He is worried about his Foley catheter and said that he will follow-up with his PCP and urology.  Discharge Exam:   BP 125/75 (BP Location: Right Arm)   Pulse (!)  56   Temp 98.2 F (36.8 C) (Oral)   Resp 15   Wt 59.7 kg   SpO2 100%   BMI 16.90 kg/m  Constitutional: Resting in bed, no acute distress, pleasant HENT: Normocephalic, atraumatic Cardiovascular: regular rate and rhythm, no m/r/g Pulmonary/Chest: normal work of breathing on room air, lungs clear to auscultation bilaterally Abdominal: Soft, nontender, normal active bowel sounds  Pertinent Labs, Studies, and Procedures:     Latest Ref Rng & Units 03/07/2023    3:15 AM 03/06/2023    2:29 AM 03/05/2023   10:47 AM  CBC  WBC 4.0 - 10.5 K/uL 7.9  6.7  9.3   Hemoglobin 13.0 - 17.0 g/dL 7.9  8.1  8.0   Hematocrit 39.0 - 52.0 % 25.5  26.2  25.6   Platelets 150 - 400 K/uL 372  371  365        Latest Ref Rng & Units 03/06/2023    2:29 AM 03/05/2023  10:47 AM 03/04/2023    9:00 AM  CMP  Glucose 70 - 99 mg/dL 105  89  85   BUN 8 - 23 mg/dL 12  14  23    Creatinine 0.61 - 1.24 mg/dL 1.10  1.18  1.28   Sodium 135 - 145 mmol/L 137  134  136   Potassium 3.5 - 5.1 mmol/L 4.0  4.0  4.0   Chloride 98 - 111 mmol/L 107  106  108   CO2 22 - 32 mmol/L 22  21  20    Calcium 8.9 - 10.3 mg/dL 8.3  8.3  8.5   Total Protein 6.5 - 8.1 g/dL   5.7   Total Bilirubin 0.3 - 1.2 mg/dL   0.7   Alkaline Phos 38 - 126 U/L   35   AST 15 - 41 U/L   13   ALT 0 - 44 U/L   12     CT ANGIO GI BLEED  Result Date: 03/03/2023 CLINICAL DATA:  GI bleed, melena, hemoglobin 4 EXAM: CTA ABDOMEN AND PELVIS WITHOUT AND WITH CONTRAST TECHNIQUE: Multidetector CT imaging of the abdomen and pelvis was performed using the standard protocol during bolus administration of intravenous contrast. Multiplanar reconstructed images and MIPs were obtained and reviewed to evaluate the vascular anatomy. RADIATION DOSE REDUCTION: This exam was performed according to the departmental dose-optimization program which includes automated exposure control, adjustment of the mA and/or kV according to patient size and/or use of iterative reconstruction  technique. CONTRAST:  8mL OMNIPAQUE IOHEXOL 350 MG/ML SOLN COMPARISON:  CT abdomen pelvis, 02/13/2022 FINDINGS: VASCULAR Normal contour and caliber of the abdominal aorta. No evidence of aneurysm, dissection, or other acute aortic pathology. Standard branching pattern of the abdominal aorta, with solitary bilateral renal arteries. Atherosclerosis of the branch vessel origins without high-grade stenosis. Review of the MIP images confirms the above findings. NON-VASCULAR Lower chest: No acute abnormality. Hepatobiliary: No solid liver abnormality is seen. Simple benign cyst of the right lobe of the liver, for which no further follow-up or characterization is required. No gallstones, gallbladder wall thickening, or biliary dilatation. Pancreas: Unremarkable. No pancreatic ductal dilatation or surrounding inflammatory changes. Spleen: Normal in size without significant abnormality. Adrenals/Urinary Tract: Adrenal glands are unremarkable. Multiple simple, benign bilateral renal cortical cysts, for which no further follow-up or characterization is required. Kidneys are otherwise normal, without renal calculi, solid lesion, or hydronephrosis. Bladder is unremarkable. Stomach/Bowel: Stomach is within normal limits. Appendix appears normal. No evidence of bowel wall thickening, distention, or inflammatory changes. Descending and sigmoid diverticulosis. No intraluminal contrast extravasation or other findings to localize nidus of GI bleeding. Lymphatic: No enlarged abdominal or pelvic lymph nodes. Reproductive: No mass or other significant abnormality. Other: No abdominal wall hernia or abnormality. No ascites. Musculoskeletal: No acute or significant osseous findings. IMPRESSION: 1. No intraluminal contrast extravasation or other findings to localize nidus of GI bleeding. 2. Descending and sigmoid diverticulosis without evidence of acute diverticulitis. 3. Normal contour and caliber of the abdominal aorta. No evidence of  aneurysm, dissection, or other acute aortic findings. Severe mixed calcific atherosclerosis. Aortic Atherosclerosis (ICD10-I70.0). Electronically Signed   By: Delanna Ahmadi M.D.   On: 03/03/2023 17:29     Discharge Instructions: Discharge Instructions     Ambulatory referral to Urology   Complete by: As directed    Call MD for:  persistant nausea and vomiting   Complete by: As directed    Call MD for:  redness, tenderness, or signs of infection (pain,  swelling, redness, odor or green/yellow discharge around incision site)   Complete by: As directed    Call MD for:  severe uncontrolled pain   Complete by: As directed    Call MD for:  temperature >100.4   Complete by: As directed    Diet - low sodium heart healthy   Complete by: As directed    Discharge instructions   Complete by: As directed    Mr. Decosmo,  It was a pleasure taking care of you during this admission.  You were hospitalized for low hemoglobin from blood loss in your stomach and small intestine.  They performed an endoscopy which showed several nonbleeding spots in the stomach and intestine.  They have cauterized those spots.  The spots can start bleeding on their own so you will have to let your regular doctor know if you notice any bleeding or black stool.  Please start taking Protonix 40 mg twice daily for 1 month and Sucrafate 1 g twice daily for 14 days.  We also started you on iron supplementations.  Please stop taking Plavix until you see your primary care doctor  Please follow-up with your primary care doctor in 1 week for repeat blood work.  Please follow-up with a urologist for your urinary issue.  Their contact information is below.  Please stop taking oxybutynin and Myrbetriq because this medication can make you retain urine.  Please continue tamsulosin.  [facility] Address: [address], [place], [place] [address] Phone: [phone]  Take care,  Dr. Alfonse Spruce   Increase activity  slowly   Complete by: As directed        Signed: Gaylan Gerold, DO 03/07/2023, 11:53 AM   Pager: [phone]

## 2023-03-06 NOTE — Transfer of Care (Signed)
Immediate Anesthesia Transfer of Care Note  Patient: Russell [name]  Procedure(s) Performed: ENTEROSCOPY COLONOSCOPY HOT HEMOSTASIS (ARGON PLASMA COAGULATION/BICAP) SUBMUCOSAL TATTOO INJECTION POLYPECTOMY  Patient Location: PACU  Anesthesia Type:MAC  Level of Consciousness: awake, alert , and oriented  Airway & Oxygen Therapy: Patient Spontanous Breathing  Post-op Assessment: Report given to RN, Post -op Vital signs reviewed and stable, and Patient moving all extremities  Post vital signs: Reviewed and stable  Last Vitals:  Vitals Value Taken Time  BP    Temp    Pulse 65 03/06/23 1206  Resp 20 03/06/23 1206  SpO2 100 % 03/06/23 1206  Vitals shown include unvalidated device data.  Last Pain:  Vitals:   03/06/23 0953  TempSrc:   PainSc: 0-No pain         Complications: No notable events documented.

## 2023-03-06 NOTE — Interval H&P Note (Signed)
History and Physical Interval Note:  03/06/2023 9:48 AM  Russell [name]  has presented today for surgery, with the diagnosis of Gastric AVMs, IDA.  The various methods of treatment have been discussed with the patient and family. After consideration of risks, benefits and other options for treatment, the patient has consented to  Procedure(s): ENTEROSCOPY (N/A) COLONOSCOPY (N/A) as a surgical intervention.  The patient's history has been reviewed, patient examined, no change in status, stable for surgery.  I have reviewed the patient's chart and labs.  Questions were answered to the patient's satisfaction.     [employer]

## 2023-03-06 NOTE — Progress Notes (Addendum)
HD#3 Subjective:   Summary: This is a 82 year old male with a past medical history of iron deficiency anemia, history of GI bleed, presents with melanotic stools.  Patient admitted for further evaluation management of suspected GI bleed.  Overnight Events: No acute events overnight  Patient resting in bed upon my exam.  Patient states he is feeling a little faint and weak.  He states he is hungry.  He denies any chest pain or shortness of breath.  He states otherwise he is doing fine.  He is ready for his procedure today.  Objective:  Vital signs in last 24 hours: Vitals:   03/06/23 0517 03/06/23 0749 03/06/23 0953 03/06/23 1205  BP: 111/71 109/66 120/65 105/63  Pulse: (!) 54 64 (!) 51 72  Resp: 17 18 12 20   Temp: 98.2 F (36.8 C) 98.2 F (36.8 C) 97.6 F (36.4 C)   TempSrc: Oral Oral    SpO2: 96% 99% 100% 100%  Weight: 59.7 kg      Supplemental O2: Room Air SpO2: 100 %   Physical Exam:  Constitutional: Resting in bed, no acute distress, pleasant HENT: Normocephalic, atraumatic Eyes: Pale conjunctiva noted Cardiovascular: regular rate and rhythm, no m/r/g Pulmonary/Chest: normal work of breathing on room air, lungs clear to auscultation bilaterally Abdominal: Soft, nontender, normal active bowel sounds   Intake/Output Summary (Last 24 hours) at 03/06/2023 1229 Last data filed at 03/06/2023 1205 Gross per 24 hour  Intake 1278.34 ml  Output 3000 ml  Net -1721.66 ml   Net IO Since Admission: -2,915.02 mL [03/06/23 1229]  Pertinent Labs:    Latest Ref Rng & Units 03/06/2023    2:29 AM 03/05/2023   10:47 AM 03/04/2023    9:00 AM  CBC  WBC 4.0 - 10.5 K/uL 6.7  9.3  6.7   Hemoglobin 13.0 - 17.0 g/dL 8.1  8.0  8.5   Hematocrit 39.0 - 52.0 % 26.2  25.6  26.0   Platelets 150 - 400 K/uL 371  365  379        Latest Ref Rng & Units 03/06/2023    2:29 AM 03/05/2023   10:47 AM 03/04/2023    9:00 AM  CMP  Glucose 70 - 99 mg/dL 105  89  85   BUN 8 - 23 mg/dL 12  14  23     Creatinine 0.61 - 1.24 mg/dL 1.10  1.18  1.28   Sodium 135 - 145 mmol/L 137  134  136   Potassium 3.5 - 5.1 mmol/L 4.0  4.0  4.0   Chloride 98 - 111 mmol/L 107  106  108   CO2 22 - 32 mmol/L 22  21  20    Calcium 8.9 - 10.3 mg/dL 8.3  8.3  8.5   Total Protein 6.5 - 8.1 g/dL   5.7   Total Bilirubin 0.3 - 1.2 mg/dL   0.7   Alkaline Phos 38 - 126 U/L   35   AST 15 - 41 U/L   13   ALT 0 - 44 U/L   12     Imaging: No results found.  Assessment/Plan:   Principal Problem:   Upper GI bleed Active Problems:   Peripheral arterial disease (HCC)   CKD (chronic kidney disease) stage 3, GFR 30-59 ml/min (HCC) - baseline SCr 2.0   Symptomatic anemia   Protein-calorie malnutrition, severe  Patient Summary:  This is a 82 year old male with a past medical history of iron deficiency anemia, history of GI  bleed, presents with melanotic stools.  Patient admitted for further evaluation management of suspected GI bleed.  #Symptomatic acute blood loss anemia #Suspected upper GI bleed #Multiple AVMs Patient had suspected upper GI bleed and scope today showing multiple AVMs in upper GI tract, likely the reason why patient had multiple GI bleeds. Colonoscopy did not show any significant lesion or masses although prep was noted to be fair at best.  Patient is status post 3 transfusions during hospitalization.  Patient's hemoglobin remained stable at this time.  Continue with IV iron at this time.  Plan to continue with Protonix 40 mg twice daily for 1 month and then transition to 40 mg daily.  Also will start Carafate for the next 2 weeks.  Patient to follow-up with GI in the outpatient setting.  -Continue Ferrlecit 250 mg daily -Continue Protonix 40 mg p.o. twice daily  -Continue monitor CBC -Resume full diet -Start Carafate  #Simple cystitis Day 3 of ceftriaxone completed. -Discontinue ceftriaxone  #BPH #Urinary incontinence #Hematuria Will try voiding trial today.  Remove coud catheter.   Bladder scans every 6 hours.  If retaining, reinsert Foley, and follow-up with urology in the outpatient setting. -Hold Myrbetriq and oxybutynin -Continue tamsulosin -Voiding trial today -Patient regardless need urology follow-up outpatient  #PAD Patient has history of PAD.  Holding Plavix.  Continue pravastatin. #Continue pravastatin  #Chronic pain Patient chronic pain and takes Percocet in the outpatient setting.  No acute concerns. -Continue Percocet as needed  #CKD 3A No acute concerns during hospitalization  Diet: Clear liquid IVF: None,None VTE: SCDs Code: Full PT/OT recs: None, none.  Dispo: Patient likely can discharge later today or tomorrow.  Will need voiding trial prior to discharge.  [facility] Resident PGY-1 [phone] Please contact the on call pager after 5 pm and on weekends at [phone].

## 2023-03-06 NOTE — Op Note (Signed)
[facility] Patient Name: Russell [name] Procedure Date : 03/06/2023 MRN: [id number] Attending MD: Justice Britain , MD, [id number] Date of Birth: [birth year] CSN: [id number] Age: 82 Admit Type: Inpatient Procedure:                Colonoscopy Indications:              Heme positive stool, Melena, Gastrointestinal                            occult blood loss Providers:                Justice Britain, MD, Grace Isaac, RN, Darliss Cheney, Technician Referring MD:             Inpatient medical service, Gladstone Pih. Candis Schatz, MD Medicines:                Monitored Anesthesia Care Complications:            No immediate complications. Estimated Blood Loss:     Estimated blood loss was minimal. Procedure:                Pre-Anesthesia Assessment:                           - Prior to the procedure, a History and Physical                            was performed, and patient medications and                            allergies were reviewed. The patient's tolerance of                            previous anesthesia was also reviewed. The risks                            and benefits of the procedure and the sedation                            options and risks were discussed with the patient.                            All questions were answered, and informed consent                            was obtained. Prior Anticoagulants: The patient has                            taken no anticoagulant or antiplatelet agents. ASA                            Grade Assessment: III - A patient with severe  systemic disease. After reviewing the risks and                            benefits, the patient was deemed in satisfactory                            condition to undergo the procedure.                           After obtaining informed consent, the colonoscope                            was passed under direct vision. Throughout the                             procedure, the patient's blood pressure, pulse, and                            oxygen saturations were monitored continuously. The                            [id number]-[id number] [id number] Olympus colonoscope was                            introduced through the anus and advanced to the the                            cecum, identified by appendiceal orifice and                            ileocecal valve. The [id number] Olympus                            colonoscope was introduced through the and advanced                            to the. Scope In: 11:32:45 AM Scope Out: 11:54:21 AM Scope Withdrawal Time: 0 hours 14 minutes 5 seconds  Total Procedure Duration: 0 hours 21 minutes 36 seconds  Findings:      The digital rectal exam findings include hemorrhoids. Pertinent       negatives include no palpable rectal lesions.      A large amount of semi-liquid stool was found in the entire colon,       making visualization difficult. Lavage of the area was performed using       copious amounts, resulting in clearance with fair visualization.      Many medium-mouthed and small-mouthed diverticula were found in the       entire colon.      Two sessile polyps were found in the recto-sigmoid colon and ascending       colon. The polyps were 3 to 14 mm in size. These polyps were removed       with a cold snare. Resection and retrieval were complete.      Non-bleeding non-thrombosed external and internal hemorrhoids were found       during retroflexion, during  perianal exam and during digital exam. The       hemorrhoids were Grade II (internal hemorrhoids that prolapse but reduce       spontaneously). Impression:               - Hemorrhoids found on digital rectal exam.                           - Stool in the entire examined colon - even after                            copious lavage preparation is only deemed to be                            fair.                            - Diverticulosis in the entire examined colon.                           - Two 3 to 14 mm polyps at the recto-sigmoid colon                            and in the ascending colon, removed with a cold                            snare. Resected and retrieved.                           - Non-bleeding non-thrombosed external and internal                            hemorrhoids. Recommendation:           - The patient will be observed post-procedure,                            until all discharge criteria are met.                           - Return patient to hospital ward for ongoing care.                           - Resume previous diet.                           - Continue present medications otherwise.                           - Patient should be started on oral iron 1 week                            after discharge.                           - IV iron should be administered as an inpatient if  not already done.                           - Set up a referral to hematology for follow-up IV                            iron in the setting of chronic anemia.                           - Await pathology results.                           - Repeat colonoscopy within the next 1 year for                            screening purposes due to the preparation being                            only fair.                           - No further inpatient GI evaluation is going to be                            pursued unless the patient has transfusion                            dependent anemia with overt bleeding. We will sign                            off but please call with questions.                           - The findings and recommendations were discussed                            with the patient.                           - The findings and recommendations were discussed                            with the referring physician. Procedure Code(s):        --- Professional  ---                           [phone], Colonoscopy, flexible; with removal of                            tumor(s), polyp(s), or other lesion(s) by snare                            technique Diagnosis Code(s):        --- Professional ---  K64.1, Second degree hemorrhoids                           D12.7, Benign neoplasm of rectosigmoid junction                           D12.2, Benign neoplasm of ascending colon                           R19.5, Other fecal abnormalities                           K92.1, Melena (includes Hematochezia)                           K57.30, Diverticulosis of large intestine without                            perforation or abscess without bleeding CPT copyright 2022 [employer]. All rights reserved. The codes documented in this report are preliminary and upon coder review may  be revised to meet current compliance requirements. Justice Britain, MD 03/06/2023 12:15:23 PM Number of Addenda: 0

## 2023-03-06 NOTE — Anesthesia Preprocedure Evaluation (Addendum)
Anesthesia Evaluation  Patient identified by MRN, date of birth, ID band Patient awake    Reviewed: Allergy & Precautions, NPO status , Patient's Chart, lab work & pertinent test results  Airway Mallampati: II  TM Distance: >3 FB Neck ROM: Full    Dental  (+) Edentulous Upper, Edentulous Lower   Pulmonary COPD, Current Smoker and Patient abstained from smoking.   Pulmonary exam normal breath sounds clear to auscultation       Cardiovascular hypertension, + CAD and + Peripheral Vascular Disease  Normal cardiovascular exam+ Valvular Problems/Murmurs MR  Rhythm:Regular Rate:Normal  Echo 02/02/22:  1. Left ventricular ejection fraction, by estimation, is 45 to 50%. The  left ventricle has mildly decreased function. The left ventricle  demonstrates global hypokinesis. There is moderate left ventricular  hypertrophy. Left ventricular diastolic  parameters are indeterminate.   2. Right ventricular systolic function is normal. The right ventricular  size is normal.   3. The mitral valve is myxomatous. Mild mitral valve regurgitation.   4. The aortic valve is tricuspid. Aortic valve regurgitation is not  visualized. No aortic stenosis is present.     Neuro/Psych Seizures -,  PSYCHIATRIC DISORDERS  Depression     Neuromuscular disease    GI/Hepatic Neg liver ROS,GERD  Medicated,,  Endo/Other  diabetes, Type 2    Renal/GU Renal InsufficiencyRenal diseasenegative Renal ROS     Musculoskeletal  (+) Arthritis ,    Abdominal   Peds  Hematology  (+) Blood dyscrasia, anemia   Anesthesia Other Findings Day of surgery medications reviewed with the patient.  Reproductive/Obstetrics                             Anesthesia Physical Anesthesia Plan  ASA: 3  Anesthesia Plan: MAC   Post-op Pain Management: Minimal or no pain anticipated   Induction: Intravenous  PONV Risk Score and Plan: 0 and TIVA and  Treatment may vary due to age or medical condition  Airway Management Planned: Natural Airway and Simple Face Mask  Additional Equipment:   Intra-op Plan:   Post-operative Plan:   Informed Consent: I have reviewed the patients History and Physical, chart, labs and discussed the procedure including the risks, benefits and alternatives for the proposed anesthesia with the patient or authorized representative who has indicated his/her understanding and acceptance.     Dental advisory given  Plan Discussed with: CRNA  Anesthesia Plan Comments:        Anesthesia Quick Evaluation

## 2023-03-07 DIAGNOSIS — R339 Retention of urine, unspecified: Secondary | ICD-10-CM | POA: Insufficient documentation

## 2023-03-07 LAB — CBC
HCT: 25.5 % — ABNORMAL LOW (ref 39.0–52.0)
Hemoglobin: 7.9 g/dL — ABNORMAL LOW (ref 13.0–17.0)
MCH: 26.2 pg (ref 26.0–34.0)
MCHC: 31 g/dL (ref 30.0–36.0)
MCV: 84.7 fL (ref 80.0–100.0)
Platelets: 372 10*3/uL (ref 150–400)
RBC: 3.01 MIL/uL — ABNORMAL LOW (ref 4.22–5.81)
RDW: 21.8 % — ABNORMAL HIGH (ref 11.5–15.5)
WBC: 7.9 10*3/uL (ref 4.0–10.5)
nRBC: 0.4 % — ABNORMAL HIGH (ref 0.0–0.2)

## 2023-03-07 MED ORDER — LIDOCAINE HCL URETHRAL/MUCOSAL 2 % EX GEL
1.0000 | Freq: Once | CUTANEOUS | Status: AC
  Administered 2023-03-07: 1 via URETHRAL
  Filled 2023-03-07: qty 6

## 2023-03-07 NOTE — Plan of Care (Signed)
  Problem: Education: Goal: Knowledge of General Education information will improve Description: Including pain rating scale, medication(s)/side effects and non-pharmacologic comfort measures Outcome: Adequate for Discharge   Problem: Health Behavior/Discharge Planning: Goal: Ability to manage health-related needs will improve Outcome: Adequate for Discharge   Problem: Clinical Measurements: Goal: Ability to maintain clinical measurements within normal limits will improve Outcome: Adequate for Discharge Goal: Will remain free from infection Outcome: Adequate for Discharge Goal: Diagnostic test results will improve Outcome: Adequate for Discharge Goal: Respiratory complications will improve Outcome: Adequate for Discharge Goal: Cardiovascular complication will be avoided Outcome: Adequate for Discharge   Problem: Activity: Goal: Risk for activity intolerance will decrease Outcome: Adequate for Discharge   Problem: Nutrition: Goal: Adequate nutrition will be maintained Outcome: Adequate for Discharge   Problem: Coping: Goal: Level of anxiety will decrease Outcome: Adequate for Discharge   Problem: Elimination: Goal: Will not experience complications related to bowel motility Outcome: Adequate for Discharge Goal: Will not experience complications related to urinary retention Outcome: Adequate for Discharge   Problem: Pain Managment: Goal: General experience of comfort will improve Outcome: Adequate for Discharge   Problem: Safety: Goal: Ability to remain free from injury will improve Outcome: Adequate for Discharge   Problem: Skin Integrity: Goal: Risk for impaired skin integrity will decrease Outcome: Adequate for Discharge   Problem: Malnutrition  (NI-5.2) Goal: Food and/or nutrient delivery Description: Individualized approach for food/nutrient provision. Outcome: Adequate for Discharge   

## 2023-03-07 NOTE — Plan of Care (Signed)

## 2023-03-07 NOTE — TOC Transition Note (Signed)
Transition of Care [facility]) - CM/SW Discharge Note   Patient Details  Name: Russell [name] MRN: [id number] Date of Birth: Nov 25, 1941  [facility] CM/SW Contact:  Zenon Mayo, RN Phone Number: 03/07/2023, 12:06 PM   Clinical Narrative:    Patient is for dc today. No TOC consult, no needs.         Patient Goals and CMS Choice      Discharge Placement                         Discharge Plan and Services Additional resources added to the After Visit Summary for                                       Social Determinants of Health (SDOH) Interventions SDOH Screenings   Tobacco Use: High Risk (03/06/2023)     Readmission Risk Interventions     No data to display

## 2023-03-09 LAB — SURGICAL PATHOLOGY

## 2023-03-12 ENCOUNTER — Other Ambulatory Visit: Payer: Self-pay | Admitting: Student

## 2023-03-16 ENCOUNTER — Encounter: Payer: Self-pay | Admitting: Gastroenterology

## 2023-04-06 ENCOUNTER — Ambulatory Visit: Payer: 59 | Admitting: Podiatry

## 2023-04-08 ENCOUNTER — Other Ambulatory Visit: Payer: 59

## 2023-04-08 ENCOUNTER — Encounter: Payer: Self-pay | Admitting: Nurse Practitioner

## 2023-04-08 ENCOUNTER — Encounter (HOSPITAL_COMMUNITY): Payer: Self-pay

## 2023-04-08 ENCOUNTER — Ambulatory Visit (INDEPENDENT_AMBULATORY_CARE_PROVIDER_SITE_OTHER): Payer: 59 | Admitting: Nurse Practitioner

## 2023-04-08 ENCOUNTER — Inpatient Hospital Stay (HOSPITAL_COMMUNITY)
Admission: EM | Admit: 2023-04-08 | Discharge: 2023-04-13 | DRG: 377 | Disposition: A | Discharge status: Home / Self Care | Payer: 59 | Source: Ambulatory Visit | Attending: Student in an Organized Health Care Education/Training Program | Admitting: Student in an Organized Health Care Education/Training Program

## 2023-04-08 VITALS — BP 100/68 | HR 78 | Ht 74.0 in | Wt 108.0 lb

## 2023-04-08 DIAGNOSIS — N1831 Chronic kidney disease, stage 3a: Secondary | ICD-10-CM | POA: Diagnosis present

## 2023-04-08 DIAGNOSIS — E1151 Type 2 diabetes mellitus with diabetic peripheral angiopathy without gangrene: Secondary | ICD-10-CM | POA: Diagnosis present

## 2023-04-08 DIAGNOSIS — G8929 Other chronic pain: Secondary | ICD-10-CM | POA: Diagnosis present

## 2023-04-08 DIAGNOSIS — K5900 Constipation, unspecified: Secondary | ICD-10-CM | POA: Diagnosis present

## 2023-04-08 DIAGNOSIS — I129 Hypertensive chronic kidney disease with stage 1 through stage 4 chronic kidney disease, or unspecified chronic kidney disease: Secondary | ICD-10-CM | POA: Diagnosis present

## 2023-04-08 DIAGNOSIS — D509 Iron deficiency anemia, unspecified: Secondary | ICD-10-CM | POA: Diagnosis not present

## 2023-04-08 DIAGNOSIS — I2510 Atherosclerotic heart disease of native coronary artery without angina pectoris: Secondary | ICD-10-CM | POA: Diagnosis present

## 2023-04-08 DIAGNOSIS — Z7984 Long term (current) use of oral hypoglycemic drugs: Secondary | ICD-10-CM

## 2023-04-08 DIAGNOSIS — K5909 Other constipation: Secondary | ICD-10-CM

## 2023-04-08 DIAGNOSIS — K1379 Other lesions of oral mucosa: Secondary | ICD-10-CM | POA: Diagnosis not present

## 2023-04-08 DIAGNOSIS — E1122 Type 2 diabetes mellitus with diabetic chronic kidney disease: Secondary | ICD-10-CM | POA: Diagnosis present

## 2023-04-08 DIAGNOSIS — Z833 Family history of diabetes mellitus: Secondary | ICD-10-CM

## 2023-04-08 DIAGNOSIS — R531 Weakness: Secondary | ICD-10-CM

## 2023-04-08 DIAGNOSIS — K31811 Angiodysplasia of stomach and duodenum with bleeding: Secondary | ICD-10-CM | POA: Diagnosis not present

## 2023-04-08 DIAGNOSIS — R338 Other retention of urine: Secondary | ICD-10-CM | POA: Diagnosis present

## 2023-04-08 DIAGNOSIS — N179 Acute kidney failure, unspecified: Secondary | ICD-10-CM | POA: Diagnosis present

## 2023-04-08 DIAGNOSIS — Z981 Arthrodesis status: Secondary | ICD-10-CM

## 2023-04-08 DIAGNOSIS — Z79899 Other long term (current) drug therapy: Secondary | ICD-10-CM

## 2023-04-08 DIAGNOSIS — E43 Unspecified severe protein-calorie malnutrition: Secondary | ICD-10-CM | POA: Diagnosis present

## 2023-04-08 DIAGNOSIS — Z7902 Long term (current) use of antithrombotics/antiplatelets: Secondary | ICD-10-CM

## 2023-04-08 DIAGNOSIS — K219 Gastro-esophageal reflux disease without esophagitis: Secondary | ICD-10-CM | POA: Diagnosis present

## 2023-04-08 DIAGNOSIS — K5520 Angiodysplasia of colon without hemorrhage: Secondary | ICD-10-CM

## 2023-04-08 DIAGNOSIS — R296 Repeated falls: Secondary | ICD-10-CM | POA: Diagnosis present

## 2023-04-08 DIAGNOSIS — J449 Chronic obstructive pulmonary disease, unspecified: Secondary | ICD-10-CM | POA: Diagnosis present

## 2023-04-08 DIAGNOSIS — N401 Enlarged prostate with lower urinary tract symptoms: Secondary | ICD-10-CM | POA: Diagnosis present

## 2023-04-08 DIAGNOSIS — Z888 Allergy status to other drugs, medicaments and biological substances status: Secondary | ICD-10-CM

## 2023-04-08 DIAGNOSIS — K31819 Angiodysplasia of stomach and duodenum without bleeding: Secondary | ICD-10-CM

## 2023-04-08 DIAGNOSIS — E785 Hyperlipidemia, unspecified: Secondary | ICD-10-CM | POA: Diagnosis present

## 2023-04-08 DIAGNOSIS — H409 Unspecified glaucoma: Secondary | ICD-10-CM | POA: Diagnosis present

## 2023-04-08 DIAGNOSIS — K921 Melena: Secondary | ICD-10-CM | POA: Diagnosis not present

## 2023-04-08 DIAGNOSIS — D62 Acute posthemorrhagic anemia: Secondary | ICD-10-CM | POA: Diagnosis present

## 2023-04-08 DIAGNOSIS — Z8249 Family history of ischemic heart disease and other diseases of the circulatory system: Secondary | ICD-10-CM

## 2023-04-08 DIAGNOSIS — E861 Hypovolemia: Secondary | ICD-10-CM | POA: Diagnosis present

## 2023-04-08 DIAGNOSIS — F17210 Nicotine dependence, cigarettes, uncomplicated: Secondary | ICD-10-CM | POA: Diagnosis present

## 2023-04-08 DIAGNOSIS — K922 Gastrointestinal hemorrhage, unspecified: Secondary | ICD-10-CM

## 2023-04-08 DIAGNOSIS — Z7982 Long term (current) use of aspirin: Secondary | ICD-10-CM

## 2023-04-08 DIAGNOSIS — M545 Low back pain, unspecified: Secondary | ICD-10-CM | POA: Diagnosis present

## 2023-04-08 DIAGNOSIS — K449 Diaphragmatic hernia without obstruction or gangrene: Secondary | ICD-10-CM | POA: Diagnosis present

## 2023-04-08 DIAGNOSIS — Z681 Body mass index (BMI) 19 or less, adult: Secondary | ICD-10-CM

## 2023-04-08 LAB — COMPREHENSIVE METABOLIC PANEL
ALT: 16 U/L (ref 0–44)
AST: 14 U/L — ABNORMAL LOW (ref 15–41)
Albumin: 4.2 g/dL (ref 3.5–5.0)
Alkaline Phosphatase: 40 U/L (ref 38–126)
Anion gap: 14 (ref 5–15)
BUN: 33 mg/dL — ABNORMAL HIGH (ref 8–23)
CO2: 19 mmol/L — ABNORMAL LOW (ref 22–32)
Calcium: 9.5 mg/dL (ref 8.9–10.3)
Chloride: 101 mmol/L (ref 98–111)
Creatinine, Ser: 1.88 mg/dL — ABNORMAL HIGH (ref 0.61–1.24)
GFR, Estimated: 35 mL/min — ABNORMAL LOW (ref >60)
Glucose, Bld: 127 mg/dL — ABNORMAL HIGH (ref 70–99)
Potassium: 4 mmol/L (ref 3.5–5.1)
Sodium: 134 mmol/L — ABNORMAL LOW (ref 135–145)
Total Bilirubin: 0.1 mg/dL — ABNORMAL LOW (ref 0.3–1.2)
Total Protein: 7.1 g/dL (ref 6.5–8.1)

## 2023-04-08 LAB — CBC
HCT: 21 % — ABNORMAL LOW (ref 39.0–52.0)
Hemoglobin: 6.2 g/dL — CL (ref 13.0–17.0)
MCH: 23.8 pg — ABNORMAL LOW (ref 26.0–34.0)
MCHC: 29.5 g/dL — ABNORMAL LOW (ref 30.0–36.0)
MCV: 80.8 fL (ref 80.0–100.0)
Platelets: 391 10*3/uL (ref 150–400)
RBC: 2.6 MIL/uL — ABNORMAL LOW (ref 4.22–5.81)
RDW: 21.9 % — ABNORMAL HIGH (ref 11.5–15.5)
WBC: 7.9 10*3/uL (ref 4.0–10.5)
nRBC: 0 % (ref 0.0–0.2)

## 2023-04-08 LAB — URINALYSIS, ROUTINE W REFLEX MICROSCOPIC
Bilirubin Urine: NEGATIVE
Glucose, UA: NEGATIVE mg/dL
Hgb urine dipstick: NEGATIVE
Ketones, ur: NEGATIVE mg/dL
Leukocytes,Ua: NEGATIVE
Nitrite: NEGATIVE
Protein, ur: NEGATIVE mg/dL
Specific Gravity, Urine: 1.016 (ref 1.005–1.030)
pH: 5 (ref 5.0–8.0)

## 2023-04-08 LAB — PREPARE RBC (CROSSMATCH)

## 2023-04-08 LAB — LIPASE, BLOOD: Lipase: 48 U/L (ref 11–51)

## 2023-04-08 LAB — TYPE AND SCREEN

## 2023-04-08 MED ORDER — OXYCODONE HCL 5 MG PO TABS
10.0000 mg | ORAL_TABLET | Freq: Three times a day (TID) | ORAL | Status: DC | PRN
  Administered 2023-04-10 – 2023-04-12 (×5): 10 mg via ORAL
  Filled 2023-04-08 (×5): qty 2

## 2023-04-08 MED ORDER — BRINZOLAMIDE 1 % OP SUSP
1.0000 [drp] | Freq: Three times a day (TID) | OPHTHALMIC | Status: DC
  Administered 2023-04-08 – 2023-04-13 (×11): 1 [drp] via OPHTHALMIC
  Filled 2023-04-08: qty 10

## 2023-04-08 MED ORDER — TAMSULOSIN HCL 0.4 MG PO CAPS
0.4000 mg | ORAL_CAPSULE | Freq: Every day | ORAL | Status: DC
  Administered 2023-04-10 – 2023-04-13 (×4): 0.4 mg via ORAL
  Filled 2023-04-08 (×4): qty 1

## 2023-04-08 MED ORDER — PRAVASTATIN SODIUM 40 MG PO TABS
80.0000 mg | ORAL_TABLET | Freq: Every day | ORAL | Status: DC
  Administered 2023-04-10 – 2023-04-13 (×4): 80 mg via ORAL
  Filled 2023-04-08 (×4): qty 2

## 2023-04-08 MED ORDER — LACTATED RINGERS IV BOLUS
500.0000 mL | Freq: Once | INTRAVENOUS | Status: AC
  Administered 2023-04-08: 500 mL via INTRAVENOUS

## 2023-04-08 MED ORDER — ACETAMINOPHEN 325 MG PO TABS: 650.0000 mg | ORAL_TABLET | Freq: Four times a day (QID) | ORAL | Status: DC | PRN

## 2023-04-08 MED ORDER — POLYETHYLENE GLYCOL 3350 17 GM/SCOOP PO POWD
0.5000 | Freq: Once | ORAL | Status: DC
  Filled 2023-04-08: qty 255

## 2023-04-08 MED ORDER — MIRTAZAPINE 15 MG PO TABS
15.0000 mg | ORAL_TABLET | Freq: Every day | ORAL | Status: DC
  Administered 2023-04-08 – 2023-04-12 (×5): 15 mg via ORAL
  Filled 2023-04-08 (×5): qty 1

## 2023-04-08 MED ORDER — LATANOPROST 0.005 % OP SOLN
1.0000 [drp] | Freq: Every day | OPHTHALMIC | Status: DC
  Administered 2023-04-08 – 2023-04-11 (×3): 1 [drp] via OPHTHALMIC
  Filled 2023-04-08: qty 2.5

## 2023-04-08 MED ORDER — PANTOPRAZOLE SODIUM 40 MG IV SOLR
40.0000 mg | Freq: Once | INTRAVENOUS | Status: AC
  Administered 2023-04-08: 40 mg via INTRAVENOUS
  Filled 2023-04-08: qty 10

## 2023-04-08 MED ORDER — SODIUM CHLORIDE 0.9% IV SOLUTION: Freq: Once | INTRAVENOUS | Status: AC

## 2023-04-08 MED ORDER — PANTOPRAZOLE SODIUM 40 MG IV SOLR
40.0000 mg | Freq: Two times a day (BID) | INTRAVENOUS | Status: DC
  Administered 2023-04-09 – 2023-04-12 (×6): 40 mg via INTRAVENOUS
  Filled 2023-04-08 (×6): qty 10

## 2023-04-08 MED ORDER — ACETAMINOPHEN 650 MG RE SUPP: 650.0000 mg | Freq: Four times a day (QID) | RECTAL | Status: DC | PRN

## 2023-04-08 MED ORDER — POLYETHYLENE GLYCOL 3350 17 GM/SCOOP PO POWD
0.5000 | Freq: Once | ORAL | Status: AC
  Administered 2023-04-08: 127.5 g via ORAL
  Filled 2023-04-08 (×2): qty 255

## 2023-04-08 MED ORDER — BRIMONIDINE TARTRATE 0.2 % OP SOLN
1.0000 [drp] | Freq: Three times a day (TID) | OPHTHALMIC | Status: DC
  Administered 2023-04-08 – 2023-04-13 (×11): 1 [drp] via OPHTHALMIC
  Filled 2023-04-08 (×2): qty 5

## 2023-04-08 MED ORDER — NICOTINE 7 MG/24HR TD PT24
7.0000 mg | MEDICATED_PATCH | Freq: Every day | TRANSDERMAL | Status: DC
  Administered 2023-04-08 – 2023-04-13 (×5): 7 mg via TRANSDERMAL
  Filled 2023-04-08 (×6): qty 1

## 2023-04-08 NOTE — ED Notes (Signed)
EDP notified of HGB 6.2

## 2023-04-08 NOTE — Patient Instructions (Addendum)
Your provider has requested that you go to the basement level for lab work before leaving today. Press "B" on the elevator. The lab is located at the first door on the left as you exit the elevator. CBC  IBC FERRITIN   Stool Softeners 2 at bedtime  Take Miralax 1 capful daily  Resume Iron daily   Call [phone] to inquire abut whether or not you are suppose to be taking Plavix or not    We have put in a referral for you to Hematology, They will contact you with an appointment   Due to recent changes in healthcare laws, you may see the results of your imaging and laboratory studies on MyChart before your provider has had a chance to review them.  We understand that in some cases there may be results that are confusing or concerning to you. Not all laboratory results come back in the same time frame and the provider may be waiting for multiple results in order to interpret others.  Please give [place] 48 hours in order for your provider to thoroughly review all the results before contacting the office for clarification of your results.    I appreciate the  opportunity to care for you  Thank You   Willette Cluster, NP

## 2023-04-08 NOTE — ED Triage Notes (Addendum)
Pt bib ems from home; went to PCP this am for routine check up; hx anemia, placed on iron supplement recently; unable to get blood for recheck of levels today at PCP office, so was sent to ED for blood work;  c/o dizziness when standing which pt states is chronic; pt also endorses lower abd pain x several days, states he has been constipated; last BM 4 days ago; denies N/V; 104/60, HR 70; ambulatory with ems

## 2023-04-08 NOTE — Progress Notes (Signed)
Patient briefly seen in the emergency department after being seen in the office today by Willette Cluster, NP.  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 After his office visit his hemoglobin was found to be 6.2 having been 8.0 a month ago.  We will plan for a small bowel enteroscopy tomorrow and possibly capsule thereafter. Half of a MiraLAX prep this evening He can have some soft food until 9 PM when we will start the prep  Dr. Adela Lank to assume care tomorrow for Russell [name] GI

## 2023-04-08 NOTE — Progress Notes (Signed)
  Assessment   Primary Gi: Russell Cunningham, MD  82 y.o. yo male with multiple medical problems not limited to depression, aortic atherosclerosis, CHF, cerebral atrophy, COPD, seizure disorder, subdural hematomas with frequent falls, glaucoma, diabetes mellitus type 2, CKD stage 3b, PAD, prostate cancer, remote alcohol use disorder (abstinent x 19 years), chronic IDA, GERD and diverticulosis.   History of profound iron deficiency anemia felt to be 2/2  gastric and small bowel AVMs (recent inpatient endoscopic evaluations) .  -hgb 7.9 at time of hospital discharge on 3/17.  Follow up hgb was up to 9 on 3/20 . Unable to ascertain if he is taking oral iron as home as it was causing constipation. Additionally I am concerned about whether he is still supposed to be taking plavix.  He says that he is taking it and the bottle is in his medication bag that he brought to the office.  However,  Plavix was not on his list of discharge medications 03/07/23.  Patient is a very limited historian  Weakness.  Possibly due to worsening anemia? Overall deconditioning? BP low normal range (below his baseline). He drove himself here and says he has an eye app later today.   Constipation. Likely related to oral iron  CKD3  Low BMI of 13, malnourishment?    Plan   -Will check cbc, ferritin and TIBC today.  -Resume oral iron at once daily ( if not taking).  -Take 2 stool softeners at bedtime  -Miralax 1 capful in 8 oz water daily as needed for constipation.  -Referral to Hematology for IDA as recommended by us during his March admission.   -Consider repeating colon in 1 year due to poor prep on colonoscopy  March 2024.  -We called [facility] about whether he is supposed to be on plavix. We didn't get an answer. Patient will need to contact PCP's office to clarify. Will give him the phone number to call.   Addendum: Lab was unable to obtain blood after 4 attempts.  Patient continues to complain of  weakness.  It was recommended that he go to the ED. He doesn't want to go right now, may go this evening.   History of Present Illness   Chief complaint:  none.    Reason for visit today is unclear. Schedule says for bowel changes but I think this is more likely a hospital follow up.   Patient was seen by us in the hospital mid February 2024 for evaluation of iron deficiency anemia.  We determined that he had actually been to ED in early February with anemia, syncope.  He was on Plavix at that time.  He was transfused, GI consult was not requested. He was readmitted mid Feb and we saw him in consult for symptomatic anemia, FOBT+. 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  Patient was readmitted mid March with recurrent profound anemia.  Small bowel endoscopy remarkable for nonbleeding angioectasias in the stomach treated with APC.  4 nonbleeding angioectasias in the duodenum treated with APC  Readmited mid March with syncope and recurrent anemia with a hgb of 4.5 He underwent another EGD and a colon   Previous GI Evaluation   02/04/23  EGD - The examined portions of the nasopharynx, oropharynx and larynx were normal. - Normal esophagus. - Two non-bleeding angiodysplastic lesions in the stomach. Treated with argon plasma coagulation (APC). - Small hiatal hernia. - Hematin (altered blood/coffee-ground-like material)   in the gastric body. - A single non-bleeding angiodysplastic lesion in the duodenum. Treated with argon plasma coagulation (APC). - No specimens collected. 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 It is possible that there may be more AVMs in the small intestine or colon  03/06/23 small bowel endoscopy - No gross lesions in the entire esophagus. Z-line regular, 40 cm from the incisors. - 2 cm hiatal hernia. 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 Treated with argon  plasma coagulation (APC). - Erythematous mucosa in the stomach. - Normal mucosa was found in the entire examined duodenum. - Four non-bleeding angioectasias in the jejunum. Treated with argon plasma coagulation (APC). - Normal mucosa was found in the rest of the visualized proximal jejunum. Tattooed distal extent.  03/06/23 colonoscopiy  - Hemorrhoids found on digital rectal exam. - Stool in the entire examined colon - even after copious lavage preparation is only deemed to be fair. - Diverticulosis in the entire examined colon. - Two 3 to 14 mm polyps at the recto-sigmoid colon and in the ascending colon, removed with a cold snare. Resected and retrieved. - Non-bleeding non-thrombosed external and internal hemorrhoids. Impression: - The patient will be observed post-procedure, until all discharge criteri   Labs:       Latest Ref Rng & Units 03/04/2023    9:00 AM 03/03/2023   12:38 PM 02/05/2023    6:44 AM  Hepatic Function  Total Protein 6.5 - 8.1 g/dL 5.7  6.2  5.3   Albumin 3.5 - 5.0 g/dL 3.1  3.4  2.8   AST 15 - 41 U/L 13  14  17   ALT 0 - 44 U/L 12  13  19   Alk Phosphatase 38 - 126 U/L 35  37  31   Total Bilirubin 0.3 - 1.2 mg/dL 0.7  0.2  0.3        Latest Ref Rng & Units 03/07/2023    3:15 AM 03/06/2023    2:29 AM 03/05/2023   10:47 AM  CBC  WBC 4.0 - 10.5 K/uL 7.9  6.7  9.3   Hemoglobin 13.0 - 17.0 g/dL 7.9  8.1  8.0   Hematocrit 39.0 - 52.0 % 25.5  26.2  25.6   Platelets 150 - 400 K/uL 372  371  365      Past Medical History:  Diagnosis Date   AKI (acute kidney injury) 06/23/2017   ARM PAIN 06/03/2010   Qualifier: Diagnosis of  By: York, LPN, Christine     Arthritis    SHOUDLERS, LUMBAR   Atherosclerosis of aorta 02/14/2022   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   BPH with obstruction/lower urinary tract symptoms    BPH with urinary obstruction 01/15/2016   Cerebral atrophy 02/14/2022   Cervical radiculopathy 01/27/2017   Chronic asymptomatic bacteriuria with  pyuria 02/14/2022   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   COPD (chronic obstructive pulmonary disease)    Coronary atherosclerosis    denied knowledge of this 10/27/16   Depression    Diverticulosis of colon    DYSPHAGIA UNSPECIFIED 05/16/2009   Qualifier: Diagnosis of  By: Mulberry MD, Elizabeth     Dyspnea    EARLY SATIETY 11/04/2007   Qualifier: Diagnosis of  By: Mulberry MD, Elizabeth     ED (erectile dysfunction)    Full dentures    GERD (gastroesophageal reflux disease)    not current   Glaucoma    History of gastritis      History of scabies    2008   History of seizure    2013-- x2   idiopathic --  none since   History of syncope    03-22-2014  DX  VAGAL RESPONSE   Hyperlipidemia, mixed    Hypertension    Mitral regurgitation    Peripheral arterial disease    one vessel runoff bilaterally via peroneal arteries   Prostate cancer    Seizures 03/2014   ? or syncope- patient refused to be admitted for further studies- "felt better"   Type 2 diabetes, diet controlled    patient and his wife said no- have not been told 01/21/17   Wears glasses     Past Surgical History:  Procedure Laterality Date   ABDOMINAL AORTOGRAM W/LOWER EXTREMITY Left 12/18/2020   Procedure: ABDOMINAL AORTOGRAM W/LOWER EXTREMITY;  Surgeon: Hawken, Thomas N, MD;  Location: [facility];  Service: Cardiovascular;  Laterality: Left;   ABDOMINAL AORTOGRAM W/LOWER EXTREMITY N/A 02/26/2021   Procedure: ABDOMINAL AORTOGRAM W/LOWER EXTREMITY;  Surgeon: Hawken, Thomas N, MD;  Location: [facility];  Service: Cardiovascular;  Laterality: N/A;   ANTERIOR CERVICAL DECOMP/DISCECTOMY FUSION  10-16-2009   C4 -- C6   ANTERIOR CERVICAL DECOMP/DISCECTOMY FUSION N/A 01/27/2017   Procedure: ANTERIOR CERVICAL DECOMPRESSION FUSION CERVICAL 3-4 WITH INSTRUMENTATION AND ALLOGRAFT;  Surgeon: Mark Dumonski, MD;  Location: [facility];  Service: Orthopedics;  Laterality: N/A;  ANTERIOR CERVICAL DECOMPRESSION FUSION CERVICAL  3-4 WITH INSTRUMENTATION AND ALLOGRAFT   CAPSULOTOMY Right 01/26/2013   Procedure: MINOR CAPSULOTOMY;  Surgeon: Thomas E Brewington Jr., MD;  Location: [facility];  Service: Ophthalmology;  Laterality: Right;   CARPAL TUNNEL RELEASE Left 01/02/2019   Procedure: LEFT CARPAL TUNNEL RELEASE;  Surgeon: Kuzma, Kevin, MD;  Location: [facility];  Service: Orthopedics;  Laterality: Left;   CARPECTOMY Right 03/13/2014   Procedure: RIGHT  PROXIMAL ROW CARPECTOMY;  Surgeon: Kevin R Kuzma, MD;  Location: [facility];  Service: Orthopedics;  Laterality: Right;   CARPECTOMY Left 10/22/2015   Procedure: LEFT WRIST PROXIMAL ROW CARPECTOMY ;  Surgeon: Kevin Kuzma, MD;  Location: [facility];  Service: Orthopedics;  Laterality: Left;   CARPOMETACARPAL (CMC) FUSION OF THUMB Right 03/13/2014   Procedure: RIGHT FUSION OF THUMB CARPOMETACARPAL (CMC) JOINT;  Surgeon: Kevin R Kuzma, MD;  Location: [facility];  Service: Orthopedics;  Laterality: Right;   CARPOMETACARPEL SUSPENSION PLASTY Left 04/04/2020   Procedure: LEFT THUMB TRAPECIECTOMY AND SUSPENSIONPLASTY;  Surgeon: Kuzma, Kevin, MD;  Location: [facility];  Service: Orthopedics;  Laterality: Left;   CATARACT EXTRACTION W/ INTRAOCULAR LENS  IMPLANT, BILATERAL  2009   COLONOSCOPY  08-31-2007   COLONOSCOPY     COLONOSCOPY N/A 03/06/2023   Procedure: COLONOSCOPY;  Surgeon: Mansouraty, Gabriel Jr., MD;  Location: [facility];  Service: Gastroenterology;  Laterality: N/A;   CRYOABLATION N/A 01/14/2015   Procedure: CRYO ABLATION PROSTATE;  Surgeon: Sigmund I Tannenbaum, MD;  Location: [facility];  Service: Urology;  Laterality: N/A;   ENTEROSCOPY N/A 03/06/2023   Procedure: ENTEROSCOPY;  Surgeon: Mansouraty, Gabriel Jr., MD;  Location: [facility];  Service: Gastroenterology;  Laterality: N/A;   ESOPHAGOGASTRODUODENOSCOPY  last one 09-11-2008   ESOPHAGOGASTRODUODENOSCOPY (EGD) WITH PROPOFOL N/A  02/04/2023   Procedure: ESOPHAGOGASTRODUODENOSCOPY (EGD) WITH PROPOFOL;  Surgeon: [name], Russell E, MD;  [facility]: [facility];  Service: Gastroenterology;  Laterality: N/A;   EXCISION MASS LEFT FACE , SUBORBITAL AREA, PLASTER RECONSTRUCTION  02-09-2011   EXCISIONAL DEBRIDEMENT   AND REPAIR RIGHT QUADRICEP TENDON  07-05-2000   FOOT SURGERY Left    HOT HEMOSTASIS N/A 02/04/2023   Procedure: HOT HEMOSTASIS (ARGON PLASMA COAGULATION/BICAP);  Surgeon: [name], Russell E, MD;  Location: [facility];  Service: Gastroenterology;  Laterality: N/A;   HOT HEMOSTASIS N/A 03/06/2023   Procedure: HOT HEMOSTASIS (ARGON PLASMA COAGULATION/BICAP);  Surgeon: Mansouraty, Gabriel Jr., MD;  Location: [facility];  Service: Gastroenterology;  Laterality: N/A;   I & D EXTREMITY Right 05/24/2013   Procedure: RIGHT INDEX WOUND DEBRIDEMENT AND CLOSURE;  Surgeon: David A Thompson, MD;  Location: [facility];  Service: Orthopedics;  Laterality: Right;   INSERTION OF SUPRAPUBIC CATHETER N/A 01/14/2015   Procedure: SUPRAPUBIC TUBE PLACEMENT;  Surgeon: Sigmund I Tannenbaum, MD;  Location: [facility];  Service: Urology;  Laterality: N/A;   KNEE ARTHROSCOPY Right 2008   LARYNGOSCOPY AND ESOPHAGOSCOPY  06-13-2010   MARSUPIALIZATION LEFT LARGE VALLECULAR CYST   MASS EXCISION Left 10/22/2015   Procedure: EXCISION MASS OF LEFT INDEX FINGER;  Surgeon: Kevin Kuzma, MD;  Location: [facility];  Service: Orthopedics;  Laterality: Left;   ORCHIECTOMY Left 02/24/2015   Procedure: SCROTAL EXPLORATION WITH LEFT ORCHIECTOMY;  Surgeon: Mark Ottelin, MD;  [facility];  Service: Urology;  Laterality: Left;   PERIPHERAL VASCULAR INTERVENTION Left 02/26/2021   Procedure: PERIPHERAL VASCULAR INTERVENTION;  Surgeon: Hawken, Thomas N, MD;  [facility]: [facility];  Service: Cardiovascular;  Laterality: Left;  Superficial femoral   POLYPECTOMY  03/06/2023   Procedure: POLYPECTOMY;  Surgeon:  Mansouraty, Gabriel Jr., MD;  Location: [facility];  Service: Gastroenterology;;   POSTERIOR CERVICAL FUSION/FORAMINOTOMY N/A 01/28/2017   Procedure: POSTERIOR SPINAL FUSION CERVICAL 3-4, CERVICAL 4-5, CERVICAL 5-6, CERVICAL 6-7 WITH INSTRUMENATION AND ALLOGRAFT;  Surgeon: Mark Dumonski, MD;  Location: [facility];  Service: Orthopedics;  Laterality: N/A;  POSTERIOR SPINAL FUSION CERVICAL 3-4, CERVICAL 4-5, CERVICAL 5-6, CERVICAL 6-7 WITH INSTRUMENATION AND ALLOGRAFT   SHOULDER ARTHROSCOPY/ DEBRIDEMENT LABRAL TEAR/  BICEPS TENOTOMY Left 09-24-2011   SUBMUCOSAL TATTOO INJECTION  03/06/2023   Procedure: SUBMUCOSAL TATTOO INJECTION;  Surgeon: Mansouraty, Gabriel Jr., MD;  Location: [facility];  Service: Gastroenterology;;   TONSILLECTOMY  as child   TRANSTHORACIC ECHOCARDIOGRAM  02-21-2010   normal LVF/  ef 55-60%/ mild to moderate MR/  moderate LAE/  mild TR   YAG LASER APPLICATION Right 01/26/2013   Procedure: YAG LASER APPLICATION;  Surgeon: Thomas E Brewington Jr., MD;  Location: [facility];  Service: Ophthalmology;  Laterality: Right;   YAG LASER CAPSULOTOMY, LEFT EYE  01-08-2011    Current Medications, Allergies, Family History and Social History were reviewed in [facility] electronic medical record.     Current Outpatient Medications  Medication Sig Dispense Refill   ALPHAGAN P 0.1 % SOLN Place 1 drop into the right eye 3 (three) times daily.     aspirin EC 81 MG tablet Take 81 mg by mouth daily. Swallow whole.     bimatoprost (LUMIGAN) 0.01 % SOLN Place 1 drop into both eyes at bedtime. 30 mL 3   Brinzolamide-Brimonidine (SIMBRINZA) 1-0.2 % SUSP Place 1 drop into both eyes 3 (three) times daily.     clopidogrel (PLAVIX) 75 MG tablet Take 75 mg by mouth daily.     esomeprazole (NEXIUM) 20 MG packet Take 20 mg by mouth daily before breakfast.     ferrous sulfate 325 (65 FE) MG EC tablet Take 325 mg by mouth 3 (three) times daily with meals.       meclizine (ANTIVERT) 25 MG tablet Take 25 mg by  mouth 3 (three) times daily as needed for dizziness.     mirtazapine (REMERON) 15 MG tablet Take 15 mg by mouth at bedtime.     pantoprazole (PROTONIX) 40 MG tablet TAKE 1 TABLET(40 MG) BY MOUTH TWICE DAILY 180 tablet 0   pravastatin (PRAVACHOL) 80 MG tablet Take 80 mg by mouth daily.   2   silodosin (RAPAFLO) 4 MG CAPS capsule Take 4 mg by mouth daily with breakfast.     No current facility-administered medications for this visit.    Review of Systems: No chest pain. No shortness of breath. No urinary complaints.    Physical Exam  Wt Readings from Last 3 Encounters:  04/08/23 108 lb (49 kg)  03/06/23 131 lb 9.8 oz (59.7 kg)  02/06/23 138 lb 3.7 oz (62.7 kg)    Ht 6' 2" (1.88 m)   Wt 108 lb (49 kg)   BMI 13.87 kg/m  Constitutional:  Pleasant, thin chronically ill appearing male in a wheelchair in NAD.  Psychiatric: Normal mood and affect.  EENT: Pupils normal.  Conjunctivae are normal. No scleral icterus. Neck supple.  Cardiovascular: Normal rate, regular rhythm.  Pulmonary/chest: Effort normal and breath sounds normal. No wheezing, rales or rhonchi. Abdominal: Soft, nondistended, nontender. Bowel sounds active throughout. There are no masses palpable. No hepatomegaly. Neurological: Alert and oriented to person place and time.  Skin: Skin is warm and dry. No rashes noted.  Russell Cropper, NP  04/08/2023, 10:25 AM          

## 2023-04-08 NOTE — H&P (Addendum)
Date: 04/08/2023               Patient Name:  Russell [name] MRN: [id number]  DOB: 05-Mar-1941 Age / Sex: 82 y.o., male   PCP: Valerie Roys, FNP         Medical Service: Internal Medicine Teaching Service         Attending Physician: Dr. Dickie La, MD    First Contact: Dr. Gaylyn Cheers Aleasha Fregeau Pager: [phone]  Second Contact: Dr. Elza Rafter Pager: [phone]       After Hours (After 5p/  First Contact Pager: [phone]  weekends / holidays): Second Contact Pager: [phone]   Chief Complaint: lightheadedness   History of Present Illness:  Russell [name] is a 82 y/o male with past medical history of T2DM, CKD3A, IDA, prior GI bleed w/ known AVMs of the duodenoum, PAD, BPH, chronic pain, glaucoma, tobacco use that presents for lightheadedness.   Of note, was discharged on 03/07/2023 after being hospitalized for symptomatic acute blood loss anemia secondary to upper GI bleed.  Started on 1 month of twice daily pantoprazole and 2 weeks of sucralfate with iron supplementation.  He was advised to hold Plavix until his repeat CBC with his PCP in 1 week.  Patient reports he has been taking all of his medications that he has with him, which includes aspirin and Plavix.  He was seen by family nurse practitioner on 3/20 which does not mention aspirin or restarting Plavix.  Patient was also seen by outpatient GI today with concern that the patient was still taking Plavix.  They were unable to draw his blood for CBC and recommended that he visit the ED.  Patient reports lightheadedness over the last 2-3 weeks since his most recent hospitalization. Worse w/ standing. Reports decreased appetite over this period of time. Also reports several falls at home due to generalized weakness of his lower extremities.  Denies having chest pain or palpitations prior to falling. Denies hitting his head or losing consciousness.  Denies having any pain from his falls. Denies noticing any blood in his stool.  Reports  last bowel movement was 2 days ago.  Had some mild lower abdominal pain today that has since resolved.  Patient denies fever, chills, cough, ST, nausea, vomiting, diarrhea, constipation, chest pain, palpitations, SOB, orthopnea, lower extremity swelling, dysuria, hematuria, urinary frequency, headache.  Review of Systems: A complete ROS was negative except as per HPI.   ED Course:  Interventions: Patient was given LR 500 mL bolus, 0.9% NaCL 10 mL/hr, IV protonix 40 mg, transfused 1 unit pRBC. GI was consulted.    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   COPD (chronic obstructive pulmonary disease)    Coronary atherosclerosis    denied knowledge of this 10/27/16   Depression    Diverticulosis of colon    DYSPHAGIA UNSPECIFIED 05/16/2009   Qualifier: Diagnosis of  By: Delrae Alfred MD, Elizabeth     Dyspnea    EARLY SATIETY 11/04/2007   Qualifier:  Diagnosis of  By: Delrae Alfred MD, Lanora Manis     ED (erectile dysfunction)    Full dentures    GERD (gastroesophageal reflux disease)    not current   Glaucoma    History of gastritis    History of scabies    2008   History of seizure    2013-- x2   idiopathic --  none since   History of syncope    03-22-2014  DX  VAGAL RESPONSE   Hyperlipidemia, mixed    Hypertension    Mitral regurgitation    Peripheral arterial disease    one vessel runoff bilaterally via peroneal arteries   Prostate cancer    Seizures 03/2014   ? or syncope- patient refused to be admitted  for further studies- "felt better"   Type 2 diabetes, diet controlled    patient and his wife said no- have not been told 01/21/17   Wears glasses     Meds:  - plavix 75 mg daily  - aspirin 81 mg  - ferrous sulfate 325 mg bid - pravastatin 80 mg daily - silodisin 4 mg daily - mirtazapine 15 mg qhs - meclizine 25 mg prn - pantoprazole 40 md bid - brimonidine eye drops  Allergies: Allergies as of 04/08/2023 - Review Complete 04/08/2023  Allergen Reaction Noted   Ultram [tramadol] Other (See Comments) 04/10/2020    Surgical History: Anterior cervical C4-C6 discectomy/fusion, left carpal tunnel release, cataract extraction, right quadricep tendon repair, left foot surgery, orchiectomy, polypectomy, left labral tear repair, tonsillectomy  Family History: MI (father), DM (unspecified), cancer (unspecified).   Social History: Currently lives with his wife. Retired Corporate investment banker. Independent of all ADLs/IADLs. Smokes 1 PPD for several years. Denies current or prior alcohol or recreational drug use.   Physical Exam: Blood pressure (!) 112/51, pulse 82, temperature 98.2 F (36.8 C), temperature source Oral, resp. rate 15, SpO2 92 %. Constitutional: elderly, appears uncomfortable, irritable HENT: Normocephalic and atraumatic. Dry mucous membranes.  Eyes: EOMI. PERRL.  Neck: Normal range of motion.  Cardiovascular: Regular rate, regular rhythm. No murmurs, rubs, or gallops. Normal radial and PT pulses bilaterally. No LE edema.  Pulmonary: Normal respiratory effort. No wheezes, rales, rhonchi, or crackles.   Abdominal: Soft. Non-distended. No tenderness. Normal bowel sounds.  Musculoskeletal: Normal range of motion.     Neurological: Alert and oriented to person, place, and time. Non-focal. Skin: warm and dry.    EKG: personally reviewed my interpretation is sinus rhythm w/ PVCs/PACs, non-specific ST abnormality  Assessment & Plan by Problem: Principal Problem:   Acute on  chronic blood loss anemia   Russell [name] is a 82 y/o male with past medical history of T2DM, CKD3A, IDA, prior GI bleed w/ known AVMs of the duodenoum, GERD, PAD, BPH, chronic pain, glaucoma, tobacco use that presents for lightheadedness and was admitted for acute on chronic blood loss anemia.   #Acute on chronic blood loss anemia #Hx of GI bleed w/ known duodenal AVMs #Hx of IDA Patient recently discharged on 3/17 after being hospitalized for acute symptomatic blood loss anemia  secondary to upper GI bleed.  Was instructed to hold Plavix at discharge until CBC was repeated at PCP office. Was discharged on ferrous sulfate 325 mg BID. Plan was to f/u w/ PCP and GI as outpatient. Patient was seen by nurse practitioner as outpatient, however no mention of restarting Plavix.  Also no mention of aspirin being restarted.  Was seen by outpatient GI today, were unable to draw  his blood for CBC, and recommended he visit the ED.  GI was also concerned that he may be taking aspirin and Plavix at home without being instructed to restart these medications.  According to patient, he has been taking Plavix and aspirin at home.  No complaints of blood in his stool but presents with lightheadedness and falls. CBC w/ normocytic anemia and Hgb 6.2 on admission. BUN elevated at 33. BP at LLN. Otherwise HDS. Given his low Hgb, recent hospitalization for GI bleed, and possibility of taking aspirin/plavix at home, there is concern for recurrent GI bleed at this time. Transfused 1 unit pRBC in the ED. Also started on IV protonix 40 mg BID. GI was consulted and plans for small bowel enteroscopy tomorrow and possible capsule afterwards. Prepping w/ miralax.  -Hold aspirin and plavix -IV protonix 40 mg BID -Transfuse if Hgb < 7 -Trend CBC -NPO at midnight  #AKI on CKD3A Baseline creatinine approximately 1.1.  Cr elevated at 1.88 on admission with GFR 35.  BUN elevated at 33.  Reports decreased PO intake at home.  Suspect  likely prerenal.  Received LR bolus in ED. Will trend renal function. -Trend BMP  #Falls #Physical deconditioning  Recent falls at home. Also decreased appetite at home. Taking mirtazapine 15 mg daily at bedtime. No chest pain or palpitations prior to falling. Low suspicion for cardiac cause of his falls at this time. Suspect likely secondary to anemia and physical deconditioning.  Suspect patient would benefit from PT evaluation. -Mirtazapine 15 mg daily -PT eval  #PAD #HLD From 4 months ago WNL with LDL 90.  Patient taking pravastatin 80 mg daily, aspirin 81 mg daily, and Plavix 75 mg daily at home.  Will hold aspirin and Plavix due to concern for acute GI bleed. -Pravastatin 80 mg daily  #Urinary retention #BPH Has history of BPH. On Silodisin 4 mg daily at home. Foley placed at most recent discharge. Was supposed to f/u w/ urology as outpatient but no note on file. Foley cath still in place. Does not appear to be confused. No urinary complaints. Low suspicion for UTI, however, pending UA ordered by ED provider. -Continue foley catheter -Tamsulosin 0.4 mg daily -Pending UA  #T2DM A1c 5.6% 9 months ago.  Currently not on any diabetic medications. CBGs in 100s on admission. Recommend repeat A1c as outpatient to assess need for pharmacotherapy.  -Repeat A1c as outpatient  #Chronic Pain Has history of chronic pain. Prescribed oxycodone-acetaminophen 10-325 mg q8 hours PRN at home. -Oxycodone 10 mg q8 hours PRN -Tylenol PRN  #Glaucoma Uses brimonidine eyedrops at home.  No complaints of eye pain at this time. -Brimonidine/Latanoprost eye drops  #Tobacco Use Reports chronic tobacco use. -Nicotine patch  #GERD Takes pantoprazole 40 mg twice daily at home. -IV protonix 40 mg BID   Diet: NPO @ midnight Bowel: miralax VTE: SCDs IVF: LR bolus Code: full PT/OT recs: pending   Prior to Admission Living Arrangement: at home w/ wife Anticipated Discharge Location:  TBD Barriers to Discharge: continued management  Dispo: Admit patient to Observation with expected length of stay less than 2 midnights.  Signed: Karoline Caldwell, MD 04/08/2023, 6:27 PM  Pager: [phone] After 5pm on weekdays and 1pm on weekends: On Call pager: [phone]

## 2023-04-08 NOTE — ED Provider Notes (Signed)
[place] EMERGENCY DEPARTMENT AT [facility] Provider Note  Medical Decision Making   HPI: Russell [name] is a 82 y.o. male with history perinent for T2DM, IDA, known AVMs of the duodenum, prior GI bleed, tobacco use, peripheral artery disease who presents complaining of lab check. Patient arrived via POV.  History provided by patient.  No interpreter required for this encounter.  Reports that he was sent over for his blood work to be checked.  Reports that he was at his physician's earlier today, they needed to recheck his blood counts, however they were unable to get an IV, therefore sent him over for blood work.  Reports that he has had poor p.o. intake for approximately 2 weeks due to intermittent abdominal pain.  Reports that he is lightheaded upon standing.  Reports that he has had persistent melena since his most recent discharge.  Denies any fevers, chills, chest pain, shortness of breath.  ROS: As per HPI. Please see MAR for complete past medical history, surgical history, and social history.   Physical exam is pertinent for newly stable, abdomen soft, nontender, thin and chronically ill-appearing.  DRE with gross melena.   The differential includes but is not limited to GI bleed, syncope, AKI, electrolyte derangement,.  Additional history obtained from: Chart review External records from outside source obtained and reviewed including: Patient with multiple prior admissions for GI bleed with significant anemia, most recently admitted for anemia of 4.5, gastroenterology workup during that admission demonstrated multiple nonbleeding AVMs.  Was discharged with hemoglobin of approximately 9.  ED provider interpretation of ECG: Rate 85, sinus rhythm, isolated PVC, no ST elevations, depressions, T wave inversions, T wave flattening in lead V1, aVL.  ED provider interpretation of radiology/imaging: Not indicated  Labs ordered were interpreted by myself as well as my  attending and were incorporated into the medical decision making process for this patient.  ED provider interpretation of labs: Lipase WNL.  CBC with anemia of 6.2, down from 7.9 on most recent prior (on 3/17).  No anemia or thrombocytopenia.  CMP with AKI to 1.88 from baseline of approximately 1.  UA wildcard.   Interventions: Bolus, RBCs, Protonix  See the EMR for full details regarding lab and imaging results.  Patient overall well-appearing on exam, patient poor historian.  Abdominal labs ordered, revealing for recurrent AKI consistent with patient's report of poor oral intake, as well as recurrent anemia.  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  DRE with gross melena concerning for GI bleed.  Patient has history of upper GI bleeds, likely recurrent bleeding AVM.  GI consulted for evaluation, secure chat sent to [facility] gastroenterologist on-call.  Internal medicine resident teaching service consulted for admission.  Accepted to their service.  No additional acute events while patient was under my care in the ED.  Consults: GI, internal medicine  Disposition: ADMIT: I believe the patient requires admission for further care and management. The patient was admitted to internal medicine with gastroenterology consult. Please see inpatient provider note for additional treatment plan details.   The plan for this patient was discussed with Dr. Lockie Mola, who voiced agreement and who oversaw evaluation and treatment of this patient.  Clinical Impression:  1. AKI (acute kidney injury)   2. Gastrointestinal hemorrhage, unspecified gastrointestinal hemorrhage type    Admit  Therapies: These medications and interventions were provided for the patient while in the ED. Medications  lactated ringers bolus 500 mL (has no administration in time range)  0.9 %  sodium chloride infusion (Manually program via Guardrails IV Fluids) (has no  administration in time range)  pantoprazole (PROTONIX) injection 40 mg (has no administration in time range)  polyethylene glycol powder (GLYCOLAX/MIRALAX) container 127.5 g (has no administration in time range)    MDM generated using voice dictation software and may contain dictation errors.  Please contact me for any clarification or with any questions.  Clinical Complexity A medically appropriate history, review of systems, and physical exam was performed.  Collateral history obtained from: Chart review I personally reviewed the labs, EKG, imaging as discussed above. Patient's presentation is most consistent with acute presentation with potential threat to life or bodily function Treatment: Hospitalization Medications: Prescription Discussed patient's care with providers from the following different specialties: GI, internal medicine  Physical Exam   ED Triage Vitals [04/08/23 1347]  Enc Vitals Group     BP 95/64     Pulse Rate 77     Resp 18     Temp 98.5 F (36.9 C)     Temp src      SpO2 96 %     Weight      Height      Head Circumference      Peak Flow      Pain Score 0     Pain Loc      Pain Edu?      Excl. in GC?      Physical Exam Vitals and nursing note reviewed.  Constitutional:      General: He is not in acute distress.    Appearance: He is well-developed.     Comments: Thin, elderly, chronically ill-appearing  HENT:     Head: Normocephalic and atraumatic.  Eyes:     Conjunctiva/sclera: Conjunctivae normal.  Cardiovascular:     Rate and Rhythm: Normal rate and regular rhythm.     Heart sounds: No murmur heard. Pulmonary:     Effort: Pulmonary effort is normal. No respiratory distress.     Breath sounds: Normal breath sounds.  Abdominal:     Palpations: Abdomen is soft.     Tenderness: There is no abdominal tenderness. There is no guarding or rebound.  Musculoskeletal:        General: No swelling.     Cervical back: Neck supple.  Skin:     General: Skin is warm and dry.     Capillary Refill: Capillary refill takes less than 2 seconds.  Neurological:     Mental Status: He is alert and oriented to person, place, and time.  Psychiatric:        Mood and Affect: Mood normal.       Procedure Note  Procedures  No orders to display    Julianne Rice, MD Emergency Medicine, PGY-2   Curley Spice, MD 04/08/23 Maureen Chatters    Virgina Norfolk, DO 04/08/23 1842

## 2023-04-08 NOTE — ED Notes (Signed)
Messaged MD regarding Blood bank reaching out for orders for  type and screen

## 2023-04-08 NOTE — ED Notes (Signed)
ED TO INPATIENT HANDOFF REPORT  ED Nurse Name and Phone #: [phone]  S Name/Age/Gender Russell [name] 82 y.o. male Room/Bed: 012C/012C  Code Status   Code Status: Full Code  Home/SNF/Other Home Patient oriented to: self, place, time, and situation Is this baseline? Yes   Triage Complete: Triage complete  Chief Complaint Acute on chronic blood loss anemia [D62]  Triage Note Pt bib ems from home; went to PCP this am for routine check up; hx anemia, placed on iron supplement recently; unable to get blood for recheck of levels today at PCP office, so was sent to ED for blood work;  c/o dizziness when standing which pt states is chronic; pt also endorses lower abd pain x several days, states he has been constipated; last BM 4 days ago; denies N/V; 104/60, HR 70; ambulatory with ems   Allergies Allergies  Allergen Reactions   Ultram [Tramadol] Other (See Comments)    Confusion     Level of Care/Admitting Diagnosis ED Disposition     ED Disposition  Admit   Condition  --   Comment  Hospital Area: [facility] [[id number]]  Level of Care: Telemetry Medical [104]  May place patient in observation at [facility] or [facility] if equivalent level of care is available:: No  Covid Evaluation: Asymptomatic - no recent exposure (last 10 days) testing not required  Diagnosis: Acute on chronic blood loss anemia [[id number]]  Admitting Physician: Dickie La [[id number]]  Attending Physician: Dickie La [[id number]]          B Medical/Surgery History Past Medical History:  Diagnosis Date   AKI (acute kidney injury) 06/23/2017   ARM PAIN 06/03/2010   Qualifier: Diagnosis of  By: Elyn Peers, LPN, Christine     Arthritis    SHOUDLERS, LUMBAR   Atherosclerosis of aorta 02/14/2022   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   BPH with obstruction/lower urinary tract symptoms    BPH with urinary obstruction 01/15/2016   Cerebral atrophy 02/14/2022   Cervical radiculopathy  01/27/2017   Chronic asymptomatic bacteriuria with pyuria 02/14/2022   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   COPD (chronic obstructive pulmonary disease)    Coronary atherosclerosis    denied knowledge of this 10/27/16   Depression    Diverticulosis of colon    DYSPHAGIA UNSPECIFIED 05/16/2009   Qualifier: Diagnosis of  By: Delrae Alfred MD, Elizabeth     Dyspnea    EARLY SATIETY 11/04/2007   Qualifier: Diagnosis of  By: Delrae Alfred MD, Lanora Manis     ED (erectile dysfunction)    Full dentures    GERD (gastroesophageal reflux disease)    not current   Glaucoma    History of gastritis    History of scabies    2008   History of seizure    2013-- x2   idiopathic --  none since   History of syncope    03-22-2014  DX  VAGAL RESPONSE   Hyperlipidemia, mixed    Hypertension    Mitral regurgitation    Peripheral arterial disease    one vessel runoff bilaterally via peroneal arteries   Prostate cancer    Seizures 03/2014   ? or syncope- patient refused to be admitted for further studies- "felt better"   Type 2 diabetes, diet controlled    patient and his wife said no- have not been told 01/21/17   Wears glasses    Past Surgical History:  Procedure Laterality Date   ABDOMINAL AORTOGRAM W/LOWER EXTREMITY Left 12/18/2020  Procedure: ABDOMINAL AORTOGRAM W/LOWER EXTREMITY;  Surgeon: Leonie Douglas, MD;  [facility];  Service: Cardiovascular;  Laterality: Left;   ABDOMINAL AORTOGRAM W/LOWER EXTREMITY N/A 02/26/2021   Procedure: ABDOMINAL AORTOGRAM W/LOWER EXTREMITY;  Surgeon: Leonie Douglas, MD;  [facility]: [facility];  Service: Cardiovascular;  Laterality: N/A;   ANTERIOR CERVICAL DECOMP/DISCECTOMY FUSION  10-16-2009   C4 -- C6   ANTERIOR CERVICAL DECOMP/DISCECTOMY FUSION N/A 01/27/2017   Procedure: ANTERIOR CERVICAL DECOMPRESSION FUSION CERVICAL 3-4 WITH INSTRUMENTATION AND ALLOGRAFT;  Surgeon: Estill Bamberg, MD;  Location: [facility];  Service: Orthopedics;  Laterality: N/A;   ANTERIOR CERVICAL DECOMPRESSION FUSION CERVICAL 3-4 WITH INSTRUMENTATION AND ALLOGRAFT   CAPSULOTOMY Right 01/26/2013   Procedure: MINOR CAPSULOTOMY;  Surgeon: Vita Erm., MD;  Location: [facility] OR;  Service: Ophthalmology;  Laterality: Right;   CARPAL TUNNEL RELEASE Left 01/02/2019   Procedure: LEFT CARPAL TUNNEL RELEASE;  Surgeon: Betha Loa, MD;  Location: [facility];  Service: Orthopedics;  Laterality: Left;   CARPECTOMY Right 03/13/2014   Procedure: RIGHT  PROXIMAL ROW CARPECTOMY;  Surgeon: Tami Ribas, MD;  Location: [facility];  Service: Orthopedics;  Laterality: Right;   CARPECTOMY Left 10/22/2015   Procedure: LEFT WRIST PROXIMAL ROW CARPECTOMY ;  Surgeon: Betha Loa, MD;  Location: [facility];  Service: Orthopedics;  Laterality: Left;   CARPOMETACARPAL (CMC) FUSION OF THUMB Right 03/13/2014   Procedure: RIGHT FUSION OF THUMB CARPOMETACARPAL [facility]) JOINT;  Surgeon: Tami Ribas, MD;  Location: [facility];  Service: Orthopedics;  Laterality: Right;   CARPOMETACARPEL SUSPENSION PLASTY Left 04/04/2020   Procedure: LEFT THUMB TRAPECIECTOMY AND SUSPENSIONPLASTY;  Surgeon: Betha Loa, MD;  Location: [facility];  Service: Orthopedics;  Laterality: Left;   CATARACT EXTRACTION W/ INTRAOCULAR LENS  IMPLANT, BILATERAL  2009   COLONOSCOPY  08-31-2007   COLONOSCOPY     COLONOSCOPY N/A 03/06/2023   Procedure: COLONOSCOPY;  Surgeon: Mansouraty, Netty Starring., MD;  Location: [facility] ENDOSCOPY;  Service: Gastroenterology;  Laterality: N/A;   CRYOABLATION N/A 01/14/2015   Procedure: CRYO ABLATION PROSTATE;  Surgeon: Kathi Ludwig, MD;  Location: [facility];  Service: Urology;  Laterality: N/A;   ENTEROSCOPY N/A 03/06/2023   Procedure: ENTEROSCOPY;  Surgeon: Meridee Score Netty Starring., MD;  Location: [facility] ENDOSCOPY;  Service: Gastroenterology;  Laterality: N/A;   ESOPHAGOGASTRODUODENOSCOPY  last one 09-11-2008    ESOPHAGOGASTRODUODENOSCOPY (EGD) WITH PROPOFOL N/A 02/04/2023   Procedure: ESOPHAGOGASTRODUODENOSCOPY (EGD) WITH PROPOFOL;  Surgeon: Jenel Lucks, MD;  Location: [facility] ENDOSCOPY;  Service: Gastroenterology;  Laterality: N/A;   EXCISION MASS LEFT FACE , SUBORBITAL AREA, PLASTER RECONSTRUCTION  02-09-2011   EXCISIONAL DEBRIDEMENT AND REPAIR RIGHT QUADRICEP TENDON  07-05-2000   FOOT SURGERY Left    HOT HEMOSTASIS N/A 02/04/2023   Procedure: HOT HEMOSTASIS (ARGON PLASMA COAGULATION/BICAP);  Surgeon: Jenel Lucks, MD;  Location: [facility] ENDOSCOPY;  Service: Gastroenterology;  Laterality: N/A;   HOT HEMOSTASIS N/A 03/06/2023   Procedure: HOT HEMOSTASIS (ARGON PLASMA COAGULATION/BICAP);  Surgeon: Lemar Lofty., MD;  Location: [facility] ENDOSCOPY;  Service: Gastroenterology;  Laterality: N/A;   I & D EXTREMITY Right 05/24/2013   Procedure: RIGHT INDEX WOUND DEBRIDEMENT AND CLOSURE;  Surgeon: Jodi Marble, MD;  Location: [facility];  Service: Orthopedics;  Laterality: Right;   INSERTION OF SUPRAPUBIC CATHETER N/A 01/14/2015   Procedure: SUPRAPUBIC TUBE PLACEMENT;  Surgeon: Kathi Ludwig, MD;  Location: [facility];  Service: Urology;  Laterality: N/A;   KNEE ARTHROSCOPY Right 2008   LARYNGOSCOPY AND ESOPHAGOSCOPY  06-13-2010   MARSUPIALIZATION LEFT LARGE VALLECULAR CYST   MASS EXCISION Left 10/22/2015   Procedure: EXCISION MASS OF LEFT INDEX FINGER;  Surgeon: Betha Loa, MD;  Location: [facility];  Service: Orthopedics;  Laterality: Left;   ORCHIECTOMY Left 02/24/2015   Procedure: SCROTAL EXPLORATION WITH LEFT ORCHIECTOMY;  Surgeon: Ihor Gully, MD;  Location: [facility] OR;  Service: Urology;  Laterality: Left;   PERIPHERAL VASCULAR INTERVENTION Left 02/26/2021   Procedure: PERIPHERAL VASCULAR INTERVENTION;  Surgeon: Leonie Douglas, MD;  [facility];  Service: Cardiovascular;  Laterality: Left;  Superficial femoral   POLYPECTOMY   03/06/2023   Procedure: POLYPECTOMY;  Surgeon: Mansouraty, Netty Starring., MD;  Location: [facility] END[facility]COPY;  Service: Gastroenterology;;   POSTERIOR CERVICAL FUSION/FORAMINOTOMY N/A 01/28/2017   Procedure: POSTERIOR SPINAL FUSION CERVICAL 3-4, CERVICAL 4-5, CERVICAL 5-6, CERVICAL 6-7 WITH INSTRUMENATION AND ALLOGRAFT;  Surgeon: Estill Bamberg, MD;  Location: [facility];  Service: Orthopedics;  Laterality: N/A;  POSTERIOR SPINAL FUSION CERVICAL 3-4, CERVICAL 4-5, CERVICAL 5-6, CERVICAL 6-7 WITH INSTRUMENATION AND ALLOGRAFT   SHOULDER ARTHROSCOPY/ DEBRIDEMENT LABRAL TEAR/  BICEPS TENOTOMY Left 09-24-2011   SUBMUCOSAL TATTOO INJECTION  03/06/2023   Procedure: SUBMUCOSAL TATTOO INJECTION;  Surgeon: Lemar Lofty., MD;  Location: [facility] ENDOSCOPY;  Service: Gastroenterology;;   TONSILLECTOMY  as child   TRANSTHORACIC ECHOCARDIOGRAM  02-21-2010   normal LVF/  ef 55-60%/ mild to moderate MR/  moderate LAE/  mild TR   YAG LASER APPLICATION Right 01/26/2013   Procedure: YAG LASER APPLICATION;  Surgeon: Vita Erm., MD;  Location: [facility] OR;  Service: Ophthalmology;  Laterality: Right;   YAG LASER CAPSULOTOMY, LEFT EYE  01-08-2011     A IV Location/Drains/Wounds Patient Lines/Drains/Airways Status     Active Line/Drains/Airways     Name Placement date Placement time Site Days   Peripheral IV 04/08/23 20 G Right Forearm 04/08/23  1632  Forearm  less than 1   Urethral Catheter shannon brown rn Coude 16 Fr. 03/07/23  0846  Coude  32            Intake/Output Last 24 hours No intake or output data in the 24 hours ending 04/08/23 1911  Labs/Imaging Results for orders placed or performed during the hospital encounter of 04/08/23 (from the past 48 hour(s))  Lipase, blood     Status: None   Collection Time: 04/08/23  2:00 PM  Result Value Ref Range   Lipase 48 11 - 51 U/L    Comment: Performed at [facility], [address], [place], [place] [address]  Comprehensive metabolic panel     Status:  Abnormal   Collection Time: 04/08/23  2:00 PM  Result Value Ref Range   Sodium 134 (L) 135 - 145 mmol/L   Potassium 4.0 3.5 - 5.1 mmol/L   Chloride 101 98 - 111 mmol/L   CO2 19 (L) 22 - 32 mmol/L   Glucose, Bld 127 (H) 70 - 99 mg/dL    Comment: Glucose reference range applies only to samples taken after fasting for at least 8 hours.   BUN 33 (H) 8 - 23 mg/dL   Creatinine, Ser 7.26 (H) 0.61 - 1.24 mg/dL   Calcium 9.5 8.9 - 20.3 mg/dL   Total Protein 7.1 6.5 - 8.1 g/dL   Albumin 4.2 3.5 - 5.0 g/dL   AST 14 (L) 15 - 41 U/L   ALT 16  0 - 44 U/L   Alkaline Phosphatase 40 38 - 126 U/L   Total Bilirubin 0.1 (L) 0.3 - 1.2 mg/dL   GFR, Estimated 35 (L) >60 mL/min    Comment: (NOTE) Calculated using the CKD-EPI Creatinine Equation (2021)    Anion gap 14 5 - 15    Comment: Performed at [facility], [address], [place], [place] [address]  CBC     Status: Abnormal   Collection Time: 04/08/23  2:00 PM  Result Value Ref Range   WBC 7.9 4.0 - 10.5 K/uL   RBC 2.60 (L) 4.22 - 5.81 MIL/uL   Hemoglobin 6.2 (LL) 13.0 - 17.0 g/dL    Comment: REPEATED TO VERIFY THIS CRITICAL RESULT HAS VERIFIED AND BEEN CALLED TO H. VAN, RN BY DAISY BLU ON 04 18 2024 AT 1529, AND HAS BEEN READ BACK.     HCT 21.0 (L) 39.0 - 52.0 %   MCV 80.8 80.0 - 100.0 fL   MCH 23.8 (L) 26.0 - 34.0 pg   MCHC 29.5 (L) 30.0 - 36.0 g/dL   RDW 60.4 (H) 54.0 - 98.1 %   Platelets 391 150 - 400 K/uL    Comment: REPEATED TO VERIFY   nRBC 0.0 0.0 - 0.2 %    Comment: Performed at [facility], [address], [place], [place] [address]  Urinalysis, Routine w reflex microscopic -Urine, Clean Catch     Status: Abnormal   Collection Time: 04/08/23  5:48 PM  Result Value Ref Range   Color, Urine YELLOW YELLOW   APPearance HAZY (A) CLEAR   Specific Gravity, Urine 1.016 1.005 - 1.030   pH 5.0 5.0 - 8.0   Glucose, UA NEGATIVE NEGATIVE mg/dL   Hgb urine dipstick NEGATIVE NEGATIVE   Bilirubin Urine NEGATIVE NEGATIVE    Ketones, ur NEGATIVE NEGATIVE mg/dL   Protein, ur NEGATIVE NEGATIVE mg/dL   Nitrite NEGATIVE NEGATIVE   Leukocytes,Ua NEGATIVE NEGATIVE    Comment: Performed at [facility], [address], [place], [place] [address]   No results found.  Pending Labs Unresulted Labs (From admission, onward)     Start     Ordered   04/09/23 0500  Basic metabolic panel  Tomorrow morning,   R        04/08/23 1823   04/09/23 0500  CBC  Tomorrow morning,   R        04/08/23 1823   04/08/23 1910  Type and screen [facility]  Once,   R       Comments: [facility]    04/08/23 1910   04/08/23 1533  Prepare RBC (crossmatch)  (Blood Administration Adult)  Once,   R       Question Answer Comment  # of Units 1 unit   Transfusion Indications Hemoglobin < 7 gm/dL and symptomatic   Number of Units to Keep Ahead NO units ahead   If emergent release call blood bank Not emergent release      04/08/23 1532            Vitals/Pain Today's Vitals   04/08/23 1731 04/08/23 1746 04/08/23 1805 04/08/23 1812  BP: (!) 116/55  (!) 112/51   Pulse:  82    Resp: Temp:    98.2 F (36.8 C)  TempSrc:    Oral  SpO2:  92%    PainSc:        Isolation Precautions  No active isolations  Medications Medications  0.9 %  sodium chloride infusion (Manually program via Guardrails IV Fluids) (has no administration in time range)  polyethylene glycol powder (GLYCOLAX/MIRALAX) container 127.5 g (has no administration in time range)  acetaminophen (TYLENOL) tablet 650 mg (has no administration in time range)    Or  acetaminophen (TYLENOL) suppository 650 mg (has no administration in time range)  pantoprazole (PROTONIX) injection 40 mg (has no administration in time range)  latanoprost (XALATAN) 0.005 % ophthalmic solution 1 drop (has no administration in time range)  brinzolamide (AZOPT) 1 % ophthalmic suspension 1 drop (has no administration in time range)    And   brimonidine (ALPHAGAN) 0.2 % ophthalmic solution 1 drop (has no administration in time range)  mirtazapine (REMERON) tablet 15 mg (has no administration in time range)  pravastatin (PRAVACHOL) tablet 80 mg (has no administration in time range)  tamsulosin (FLOMAX) capsule 0.4 mg (has no administration in time range)  nicotine (NICODERM CQ - dosed in mg/24 hr) patch 7 mg (has no administration in time range)  lactated ringers bolus 500 mL (500 mLs Intravenous Bolus 04/08/23 1808)  pantoprazole (PROTONIX) injection 40 mg (40 mg Intravenous Given 04/08/23 1805)    Mobility walks with device     Focused Assessments    R Recommendations: See Admitting Provider Note  Report given to:   Additional Notes:

## 2023-04-08 NOTE — H&P (View-Only) (Signed)
Assessment   Primary Gi: Russell Amass, MD  82 y.o. yo male with multiple medical problems not limited to depression, aortic atherosclerosis, CHF, cerebral atrophy, COPD, seizure disorder, subdural hematomas with frequent falls, glaucoma, diabetes mellitus type 2, CKD stage 3b, PAD, prostate cancer, remote alcohol use disorder (abstinent x 19 years), chronic IDA, GERD and diverticulosis.   History of profound iron deficiency anemia felt to be 2/2  gastric and small bowel AVMs (recent inpatient endoscopic evaluations) .  -hgb 7.9 at time of hospital discharge on 3/17.  Follow up hgb was up to 9 on 3/20 . Unable to ascertain if he is taking oral iron as home as it was causing constipation. Additionally I am concerned about whether he is still supposed to be taking plavix.  He says that he is taking it and the bottle is in his medication bag that he brought to the office.  However,  Plavix was not on his list of discharge medications 03/07/23.  Patient is a very limited historian  Weakness.  Possibly due to worsening anemia? Overall deconditioning? BP low normal range (below his baseline). He drove himself here and says he has an eye app later today.   Constipation. Likely related to oral iron  CKD3  Low BMI of 13, malnourishment?    Plan   -Will check cbc, ferritin and TIBC today.  -Resume oral iron at once daily ( if not taking).  -Take 2 stool softeners at bedtime  -Miralax 1 capful in 8 oz water daily as needed for constipation.  -Referral to Hematology for IDA as recommended by [place] during his March admission.   -Consider repeating colon in 1 year due to poor prep on colonoscopy  March 2024.  -We called [facility] about whether he is supposed to be on plavix. We didn't get an answer. Patient will need to contact PCP's office to clarify. Will give him the phone number to call.   Addendum: Lab was unable to obtain blood after 4 attempts.  Patient continues to complain of  weakness.  It was recommended that he go to the ED. He doesn't want to go right now, may go this evening.   History of Present Illness   Chief complaint:  none.    Reason for visit today is unclear. Schedule says for bowel changes but I think this is more likely a hospital follow up.   Patient was seen by [place] in the hospital mid February 2024 for evaluation of iron deficiency anemia.  We determined that he had actually been to ED in early February with anemia, syncope.  He was on Plavix at that time.  He was transfused, GI consult was not requested. He was readmitted mid Feb and we saw him in consult for symptomatic anemia, FOBT+. 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  Patient was readmitted mid March with recurrent profound anemia.  Small bowel endoscopy remarkable for nonbleeding angioectasias in the stomach treated with APC.  4 nonbleeding angioectasias in the duodenum treated with APC  Readmited mid March with syncope and recurrent anemia with a hgb of 4.5 He underwent another EGD and a colon   Previous GI Evaluation   02/04/23  EGD - The examined portions of the nasopharynx, oropharynx and larynx were normal. - Normal esophagus. - Two non-bleeding angiodysplastic lesions in the stomach. Treated with argon plasma coagulation (APC). - Small hiatal hernia. - Hematin (altered blood/coffee-ground-like material)  in the gastric body. - A single non-bleeding angiodysplastic lesion in the duodenum. Treated with argon plasma coagulation (APC). - No specimens collected. 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 It is possible that there may be more AVMs in the small intestine or colon  03/06/23 small bowel endoscopy - No gross lesions in the entire esophagus. Z-line regular, 40 cm from the incisors. - 2 cm hiatal hernia. 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 Treated with argon  plasma coagulation (APC). - Erythematous mucosa in the stomach. - Normal mucosa was found in the entire examined duodenum. - Four non-bleeding angioectasias in the jejunum. Treated with argon plasma coagulation (APC). - Normal mucosa was found in the rest of the visualized proximal jejunum. Tattooed distal extent.  03/06/23 colonoscopiy  - Hemorrhoids found on digital rectal exam. - Stool in the entire examined colon - even after copious lavage preparation is only deemed to be fair. - Diverticulosis in the entire examined colon. - Two 3 to 14 mm polyps at the recto-sigmoid colon and in the ascending colon, removed with a cold snare. Resected and retrieved. - Non-bleeding non-thrombosed external and internal hemorrhoids. Impression: - The patient will be observed post-procedure, until all discharge criteri   Labs:       Latest Ref Rng & Units 03/04/2023    9:00 AM 03/03/2023   12:38 PM 02/05/2023    6:44 AM  Hepatic Function  Total Protein 6.5 - 8.1 g/dL 5.7  6.2  5.3   Albumin 3.5 - 5.0 g/dL 3.1  3.4  2.8   AST 15 - 41 U/L ALT 0 - 44 U/L Alk Phosphatase 38 - 126 U/L 35  37  31   Total Bilirubin 0.3 - 1.2 mg/dL 0.7  0.2  0.3        Latest Ref Rng & Units 03/07/2023    3:15 AM 03/06/2023    2:29 AM 03/05/2023   10:47 AM  CBC  WBC 4.0 - 10.5 K/uL 7.9  6.7  9.3   Hemoglobin 13.0 - 17.0 g/dL 7.9  8.1  8.0   Hematocrit 39.0 - 52.0 % 25.5  26.2  25.6   Platelets 150 - 400 K/uL 372  371  365      Past Medical History:  Diagnosis Date   AKI (acute kidney injury) 06/23/2017   ARM PAIN 06/03/2010   Qualifier: Diagnosis of  By: Elyn Peers, LPN, Christine     Arthritis    SHOUDLERS, LUMBAR   Atherosclerosis of aorta 02/14/2022   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   BPH with obstruction/lower urinary tract symptoms    BPH with urinary obstruction 01/15/2016   Cerebral atrophy 02/14/2022   Cervical radiculopathy 01/27/2017   Chronic asymptomatic bacteriuria with  pyuria 02/14/2022   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   COPD (chronic obstructive pulmonary disease)    Coronary atherosclerosis    denied knowledge of this 10/27/16   Depression    Diverticulosis of colon    DYSPHAGIA UNSPECIFIED 05/16/2009   Qualifier: Diagnosis of  By: Delrae Alfred MD, Elizabeth     Dyspnea    EARLY SATIETY 11/04/2007   Qualifier: Diagnosis of  By: Delrae Alfred MD, Lanora Manis     ED (erectile dysfunction)    Full dentures    GERD (gastroesophageal reflux disease)    not current   Glaucoma    History of gastritis  History of scabies    2008   History of seizure    2013-- x2   idiopathic --  none since   History of syncope    03-22-2014  DX  VAGAL RESPONSE   Hyperlipidemia, mixed    Hypertension    Mitral regurgitation    Peripheral arterial disease    one vessel runoff bilaterally via peroneal arteries   Prostate cancer    Seizures 03/2014   ? or syncope- patient refused to be admitted for further studies- "felt better"   Type 2 diabetes, diet controlled    patient and his wife said no- have not been told 01/21/17   Wears glasses     Past Surgical History:  Procedure Laterality Date   ABDOMINAL AORTOGRAM W/LOWER EXTREMITY Left 12/18/2020   Procedure: ABDOMINAL AORTOGRAM W/LOWER EXTREMITY;  Surgeon: Leonie Douglas, MD;  Location: [facility];  Service: Cardiovascular;  Laterality: Left;   ABDOMINAL AORTOGRAM W/LOWER EXTREMITY N/A 02/26/2021   Procedure: ABDOMINAL AORTOGRAM W/LOWER EXTREMITY;  Surgeon: Leonie Douglas, MD;  Location: [facility];  Service: Cardiovascular;  Laterality: N/A;   ANTERIOR CERVICAL DECOMP/DISCECTOMY FUSION  10-16-2009   C4 -- C6   ANTERIOR CERVICAL DECOMP/DISCECTOMY FUSION N/A 01/27/2017   Procedure: ANTERIOR CERVICAL DECOMPRESSION FUSION CERVICAL 3-4 WITH INSTRUMENTATION AND ALLOGRAFT;  Surgeon: Estill Bamberg, MD;  Location: [facility];  Service: Orthopedics;  Laterality: N/A;  ANTERIOR CERVICAL DECOMPRESSION FUSION CERVICAL  3-4 WITH INSTRUMENTATION AND ALLOGRAFT   CAPSULOTOMY Right 01/26/2013   Procedure: MINOR CAPSULOTOMY;  Surgeon: Vita Erm., MD;  Location: [facility] OR;  Service: Ophthalmology;  Laterality: Right;   CARPAL TUNNEL RELEASE Left 01/02/2019   Procedure: LEFT CARPAL TUNNEL RELEASE;  Surgeon: Betha Loa, MD;  Location: [facility];  Service: Orthopedics;  Laterality: Left;   CARPECTOMY Right 03/13/2014   Procedure: RIGHT  PROXIMAL ROW CARPECTOMY;  Surgeon: Tami Ribas, MD;  Location: [facility];  Service: Orthopedics;  Laterality: Right;   CARPECTOMY Left 10/22/2015   Procedure: LEFT WRIST PROXIMAL ROW CARPECTOMY ;  Surgeon: Betha Loa, MD;  Location: [facility];  Service: Orthopedics;  Laterality: Left;   CARPOMETACARPAL (CMC) FUSION OF THUMB Right 03/13/2014   Procedure: RIGHT FUSION OF THUMB CARPOMETACARPAL [facility]) JOINT;  Surgeon: Tami Ribas, MD;  Location: [facility];  Service: Orthopedics;  Laterality: Right;   CARPOMETACARPEL SUSPENSION PLASTY Left 04/04/2020   Procedure: LEFT THUMB TRAPECIECTOMY AND SUSPENSIONPLASTY;  Surgeon: Betha Loa, MD;  Location: [facility];  Service: Orthopedics;  Laterality: Left;   CATARACT EXTRACTION W/ INTRAOCULAR LENS  IMPLANT, BILATERAL  2009   COLONOSCOPY  08-31-2007   COLONOSCOPY     COLONOSCOPY N/A 03/06/2023   Procedure: COLONOSCOPY;  Surgeon: Mansouraty, Netty Starring., MD;  Location: [facility] END[facility]COPY;  Service: Gastroenterology;  Laterality: N/A;   CRYOABLATION N/A 01/14/2015   Procedure: CRYO ABLATION PROSTATE;  Surgeon: Kathi Ludwig, MD;  Location: [facility];  Service: Urology;  Laterality: N/A;   ENTEROSCOPY N/A 03/06/2023   Procedure: ENTEROSCOPY;  Surgeon: Meridee Score Netty Starring., MD;  Location: [facility] ENDOSCOPY;  Service: Gastroenterology;  Laterality: N/A;   ESOPHAGOGASTRODUODENOSCOPY  last one 09-11-2008   ESOPHAGOGASTRODUODENOSCOPY (EGD) WITH PROPOFOL N/A  02/04/2023   Procedure: ESOPHAGOGASTRODUODENOSCOPY (EGD) WITH PROPOFOL;  Surgeon: Jenel Lucks, MD;  Location: [facility] ENDOSCOPY;  Service: Gastroenterology;  Laterality: N/A;   EXCISION MASS LEFT FACE , SUBORBITAL AREA, PLASTER RECONSTRUCTION  02-09-2011   EXCISIONAL DEBRIDEMENT  AND REPAIR RIGHT QUADRICEP TENDON  07-05-2000   FOOT SURGERY Left    HOT HEMOSTASIS N/A 02/04/2023   Procedure: HOT HEMOSTASIS (ARGON PLASMA COAGULATION/BICAP);  Surgeon: Jenel Lucks, MD;  Location: [facility] ENDOSCOPY;  Service: Gastroenterology;  Laterality: N/A;   HOT HEMOSTASIS N/A 03/06/2023   Procedure: HOT HEMOSTASIS (ARGON PLASMA COAGULATION/BICAP);  Surgeon: Lemar Lofty., MD;  Location: [facility];  Service: Gastroenterology;  Laterality: N/A;   I & D EXTREMITY Right 05/24/2013   Procedure: RIGHT INDEX WOUND DEBRIDEMENT AND CLOSURE;  Surgeon: Jodi Marble, MD;  Location: [facility];  Service: Orthopedics;  Laterality: Right;   INSERTION OF SUPRAPUBIC CATHETER N/A 01/14/2015   Procedure: SUPRAPUBIC TUBE PLACEMENT;  Surgeon: Kathi Ludwig, MD;  Location: [facility];  Service: Urology;  Laterality: N/A;   KNEE ARTHROSCOPY Right 2008   LARYNGOSCOPY AND ESOPHAGOSCOPY  06-13-2010   MARSUPIALIZATION LEFT LARGE VALLECULAR CYST   MASS EXCISION Left 10/22/2015   Procedure: EXCISION MASS OF LEFT INDEX FINGER;  Surgeon: Betha Loa, MD;  Location: [facility];  Service: Orthopedics;  Laterality: Left;   ORCHIECTOMY Left 02/24/2015   Procedure: SCROTAL EXPLORATION WITH LEFT ORCHIECTOMY;  Surgeon: Ihor Gully, MD;  Location: [facility] OR;  Service: Urology;  Laterality: Left;   PERIPHERAL VASCULAR INTERVENTION Left 02/26/2021   Procedure: PERIPHERAL VASCULAR INTERVENTION;  Surgeon: Leonie Douglas, MD;  Location: [facility];  Service: Cardiovascular;  Laterality: Left;  Superficial femoral   POLYPECTOMY  03/06/2023   Procedure: POLYPECTOMY;  Surgeon:  Mansouraty, Netty Starring., MD;  Location: [facility] ENDOSCOPY;  Service: Gastroenterology;;   POSTERIOR CERVICAL FUSION/FORAMINOTOMY N/A 01/28/2017   Procedure: POSTERIOR SPINAL FUSION CERVICAL 3-4, CERVICAL 4-5, CERVICAL 5-6, CERVICAL 6-7 WITH INSTRUMENATION AND ALLOGRAFT;  Surgeon: Estill Bamberg, MD;  Location: [facility];  Service: Orthopedics;  Laterality: N/A;  POSTERIOR SPINAL FUSION CERVICAL 3-4, CERVICAL 4-5, CERVICAL 5-6, CERVICAL 6-7 WITH INSTRUMENATION AND ALLOGRAFT   SHOULDER ARTHROSCOPY/ DEBRIDEMENT LABRAL TEAR/  BICEPS TENOTOMY Left 09-24-2011   SUBMUCOSAL TATTOO INJECTION  03/06/2023   Procedure: SUBMUCOSAL TATTOO INJECTION;  Surgeon: Lemar Lofty., MD;  Location: [facility] ENDOSCOPY;  Service: Gastroenterology;;   TONSILLECTOMY  as child   TRANSTHORACIC ECHOCARDIOGRAM  02-21-2010   normal LVF/  ef 55-60%/ mild to moderate MR/  moderate LAE/  mild TR   YAG LASER APPLICATION Right 01/26/2013   Procedure: YAG LASER APPLICATION;  Surgeon: Vita Erm., MD;  Location: [facility] OR;  Service: Ophthalmology;  Laterality: Right;   YAG LASER CAPSULOTOMY, LEFT EYE  01-08-2011    Current Medications, Allergies, Family History and Social History were reviewed in [employer] record.     Current Outpatient Medications  Medication Sig Dispense Refill   ALPHAGAN P 0.1 % SOLN Place 1 drop into the right eye 3 (three) times daily.     aspirin EC 81 MG tablet Take 81 mg by mouth daily. Swallow whole.     bimatoprost (LUMIGAN) 0.01 % SOLN Place 1 drop into both eyes at bedtime. 30 mL 3   Brinzolamide-Brimonidine (SIMBRINZA) 1-0.2 % SUSP Place 1 drop into both eyes 3 (three) times daily.     clopidogrel (PLAVIX) 75 MG tablet Take 75 mg by mouth daily.     esomeprazole (NEXIUM) 20 MG packet Take 20 mg by mouth daily before breakfast.     ferrous sulfate 325 (65 FE) MG EC tablet Take 325 mg by mouth 3 (three) times daily with meals.  meclizine (ANTIVERT) 25 MG tablet Take 25 mg by  mouth 3 (three) times daily as needed for dizziness.     mirtazapine (REMERON) 15 MG tablet Take 15 mg by mouth at bedtime.     pantoprazole (PROTONIX) 40 MG tablet TAKE 1 TABLET(40 MG) BY MOUTH TWICE DAILY 180 tablet 0   pravastatin (PRAVACHOL) 80 MG tablet Take 80 mg by mouth daily.   2   silodosin (RAPAFLO) 4 MG CAPS capsule Take 4 mg by mouth daily with breakfast.     No current facility-administered medications for this visit.    Review of Systems: No chest pain. No shortness of breath. No urinary complaints.    Physical Exam  Wt Readings from Last 3 Encounters:  04/08/23 108 lb (49 kg)  03/06/23 131 lb 9.8 oz (59.7 kg)  02/06/23 138 lb 3.7 oz (62.7 kg)    Ht  (1.88 m)   Wt 108 lb (49 kg)   BMI 13.87 kg/m  Constitutional:  Pleasant, thin chronically ill appearing male in a wheelchair in NAD.  Psychiatric: Normal mood and affect.  EENT: Pupils normal.  Conjunctivae are normal. No scleral icterus. Neck supple.  Cardiovascular: Normal rate, regular rhythm.  Pulmonary/chest: Effort normal and breath sounds normal. No wheezing, rales or rhonchi. Abdominal: Soft, nondistended, nontender. Bowel sounds active throughout. There are no masses palpable. No hepatomegaly. Neurological: Alert and oriented to person place and time.  Skin: Skin is warm and dry. No rashes noted.  Russell Cluster, NP  04/08/2023, 10:25 AM

## 2023-04-08 NOTE — ED Provider Notes (Signed)
I have personally seen and examined the patient. I have reviewed the documentation on PMH/FH/Soc Hx. I have discussed the plan of care with the resident and patient.  I have reviewed and agree with the resident's documentation. Please see associated encounter note.  Briefly, the patient is a 82 y.o. male here with melena.  Hemoglobin 6.2.  Was recently admitted for GI bleed with nonbleeding AVMs.  Will type and transfuse him 1 unit of packed red blood cells.  He is hemodynamically stable otherwise.  Seems like he is on some antiplatelet meds.  Will give some IV Protonix.  I have let the GI team with [facility] aware of his admission to medicine.  This chart was dictated using voice recognition software.  Despite best efforts to proofread,  errors can occur which can change the documentation meaning.    EKG Interpretation  Date/Time:  Thursday April 08 2023 13:46:47 EDT Ventricular Rate:  85 PR Interval:  142 QRS Duration: 86 QT Interval:  380 QTC Calculation: 452 R Axis:   60 Text Interpretation: Sinus rhythm with occasional Premature ventricular complexes and Premature atrial complexes Nonspecific ST abnormality Abnormal ECG When compared with ECG of 03-Mar-2023 11:48, PREVIOUS ECG IS PRESENT Confirmed by Virgina Norfolk ([id number]) on 04/08/2023 3:10:06 PM       .Critical Care  Performed by: Virgina Norfolk, DO Authorized by: Virgina Norfolk, DO   Critical care provider statement:    Critical care time (minutes):  35   Critical care was necessary to treat or prevent imminent or life-threatening deterioration of the following conditions: gi bleed, needs transfusion of blood.   Critical care was time spent personally by me on the following activities:  Blood draw for specimens, development of treatment plan with patient or surrogate, discussions with primary provider, discussions with consultants, evaluation of patient's response to treatment, obtaining history from patient or surrogate, ordering and  performing treatments and interventions, ordering and review of laboratory studies, ordering and review of radiographic studies, pulse oximetry, re-evaluation of patient's condition and review of old charts   I assumed direction of critical care for this patient from another provider in my specialty: no       Virgina Norfolk, DO 04/08/23 1622

## 2023-04-08 NOTE — ED Notes (Signed)
Pt received for care at 1915.  Per report, pt needing Type and Screen per Blood Bank in order to prepare ordered blood transfusion.  This RN will obtain Type and Screen when able.

## 2023-04-08 NOTE — Hospital Course (Addendum)
Russell [name] is a 82 y/o male with past medical history of T2DM, CKD3A, IDA, prior GI bleed w/ known AVMs of the duodenoum, GERD, PAD, BPH, chronic pain, glaucoma, tobacco use that presents for lightheadedness and was admitted for acute on chronic blood loss anemia.    #Acute on chronic blood loss anemia #Hx of GI bleed w/ known duodenal AVMs #Hx of IDA Patient recently discharged on 3/17 after being hospitalized for acute symptomatic blood loss anemia  secondary to upper GI bleed.  Was instructed to hold Plavix at discharge until CBC was repeated at PCP office. Was discharged on ferrous sulfate 325 mg BID. Plan was to f/u w/ PCP and GI as outpatient. Patient was seen by nurse practitioner as outpatient, however no mention of restarting Plavix.  Also no mention of aspirin being restarted.  Was seen by outpatient GI today, were unable to draw his blood for CBC, and recommended he visit the ED.  GI was also concerned that he may be taking aspirin and Plavix at home without being instructed to restart these medications.  According to patient, he has been taking Plavix and aspirin at home.  No complaints of blood in his stool but presents with lightheadedness and falls. CBC w/ normocytic anemia and Hgb 6.2 on admission. BUN elevated at 33. BP at LLN. Otherwise HDS. Given his low Hgb, recent hospitalization for GI bleed, and possibility of taking aspirin/plavix at home, there is concern for recurrent GI bleed at this time. Transfused 1 unit pRBC in the ED. Also started on IV protonix 40 mg BID. GI was consulted and plans for small bowel enteroscopy tomorrow and possible capsule afterwards. Prepping w/ miralax.  -Hold aspirin and plavix -IV protonix 40 mg BID -Transfuse if Hgb < 7 -Trend CBC -NPO at midnight  S/p small bowel enteroscopy on 4/19. Demonstrated AVMs, treated w/ APC. Capsule pill endoscopy following demonstrated more bleeding AVMs (not able to be reached by scope per GI). CTA abdomen yesterday  did not demonstrate localization of GI bleed. S/p 2X doses of IV ferrlecit. Has received 2X units of pRBC, most recently yesterday. Post-transfusion hgb improved to 7.7. Hgb improved to 8.1 this morning. Given the difficult of receiving octreotide therapy as outpatient, patient is at risk for recurrent bleeding upon discharge. Will refer to hematology as outpatient as patient may require IV iron if continuing to have slow bleeds.     #AKI on CKD3A Baseline creatinine approximately 1.1.  Cr elevated at 1.88 on admission with GFR 35.  BUN elevated at 33.  Reports decreased PO intake at home.  Suspect likely prerenal.  Received LR bolus and 0.9% NaCL 10 mL/hr in ED. Will continue IV fluids and trend renal function. -0.9% NaCl 10 mL/hr -Trend BMP   #Falls #Physical deconditioning  Recent falls at home. Also decreased appetite at home. Taking mirtazapine 15 mg daily at bedtime. No chest pain or palpitations prior to falling. Low suspicion for cardiac cause of his falls at this time. Suspect likely secondary to anemia and physical deconditioning.  Suspect patient would benefit from PT evaluation. -Mirtazapine 15 mg daily -PT eval   #PAD #HLD From 4 months ago WNL with LDL 90.  Patient taking pravastatin 80 mg daily, aspirin 81 mg daily, and Plavix 75 mg daily at home.  Will hold aspirin and Plavix due to concern for acute GI bleed. -Pravastatin 80 mg daily   #Urinary retention #BPH Has history of BPH. On Silodisin 4 mg daily at home. Foley placed at  most recent discharge. Was supposed to f/u w/ urology as outpatient but no note on file. Foley cath still in place. Does not appear to be confused. No urinary complaints. Low suspicion for UTI, however, pending UA ordered by ED provider. -Continue foley catheter -Tamsulosin 0.4 mg daily -Pending UA   #T2DM A1c 5.6% 9 months ago.  Currently not on any diabetic medications. CBGs in 100s on admission. Recommend repeat A1c as outpatient to assess need for  pharmacotherapy.  -Repeat A1c as outpatient   #Chronic Pain Has history of chronic pain. Prescribed oxycodone-acetaminophen 10-325 mg q8 hours PRN at home. -Oxycodone 10 mg q8 hours PRN -Tylenol PRN   #Glaucoma Was using brimonidine eyedrops at home.  No complaints of eye pain throughout his admission.  -Brimonidine/Latanoprost eye drops    ----------------------------------   4/23 Patient resting comfortably in bed. 1BM without melena or bright blood. Dizziness when ambulating yesterday. Denies nausea, vomiting, or abdominal discomfort.  -messaged the nurse octeotride injection education Patient with transportation

## 2023-04-09 ENCOUNTER — Encounter (HOSPITAL_COMMUNITY)
Admission: EM | Disposition: A | Discharge status: Home / Self Care | Payer: Self-pay | Source: Ambulatory Visit | Attending: Internal Medicine

## 2023-04-09 ENCOUNTER — Observation Stay (HOSPITAL_COMMUNITY): Payer: 59 | Admitting: Certified Registered"

## 2023-04-09 ENCOUNTER — Other Ambulatory Visit: Payer: Self-pay

## 2023-04-09 ENCOUNTER — Observation Stay (HOSPITAL_BASED_OUTPATIENT_CLINIC_OR_DEPARTMENT_OTHER): Payer: 59 | Admitting: Certified Registered"

## 2023-04-09 DIAGNOSIS — M545 Low back pain, unspecified: Secondary | ICD-10-CM | POA: Diagnosis present

## 2023-04-09 DIAGNOSIS — E861 Hypovolemia: Secondary | ICD-10-CM | POA: Diagnosis present

## 2023-04-09 DIAGNOSIS — G8929 Other chronic pain: Secondary | ICD-10-CM | POA: Diagnosis present

## 2023-04-09 DIAGNOSIS — K5900 Constipation, unspecified: Secondary | ICD-10-CM | POA: Diagnosis present

## 2023-04-09 DIAGNOSIS — D62 Acute posthemorrhagic anemia: Secondary | ICD-10-CM | POA: Diagnosis present

## 2023-04-09 DIAGNOSIS — K219 Gastro-esophageal reflux disease without esophagitis: Secondary | ICD-10-CM | POA: Diagnosis present

## 2023-04-09 DIAGNOSIS — I129 Hypertensive chronic kidney disease with stage 1 through stage 4 chronic kidney disease, or unspecified chronic kidney disease: Secondary | ICD-10-CM | POA: Diagnosis present

## 2023-04-09 DIAGNOSIS — N401 Enlarged prostate with lower urinary tract symptoms: Secondary | ICD-10-CM | POA: Diagnosis present

## 2023-04-09 DIAGNOSIS — K31811 Angiodysplasia of stomach and duodenum with bleeding: Secondary | ICD-10-CM

## 2023-04-09 DIAGNOSIS — K5520 Angiodysplasia of colon without hemorrhage: Secondary | ICD-10-CM | POA: Diagnosis not present

## 2023-04-09 DIAGNOSIS — N179 Acute kidney failure, unspecified: Secondary | ICD-10-CM | POA: Diagnosis present

## 2023-04-09 DIAGNOSIS — K449 Diaphragmatic hernia without obstruction or gangrene: Secondary | ICD-10-CM | POA: Diagnosis present

## 2023-04-09 DIAGNOSIS — I2510 Atherosclerotic heart disease of native coronary artery without angina pectoris: Secondary | ICD-10-CM | POA: Diagnosis present

## 2023-04-09 DIAGNOSIS — J449 Chronic obstructive pulmonary disease, unspecified: Secondary | ICD-10-CM

## 2023-04-09 DIAGNOSIS — R296 Repeated falls: Secondary | ICD-10-CM | POA: Diagnosis present

## 2023-04-09 DIAGNOSIS — D500 Iron deficiency anemia secondary to blood loss (chronic): Secondary | ICD-10-CM

## 2023-04-09 DIAGNOSIS — K1379 Other lesions of oral mucosa: Secondary | ICD-10-CM | POA: Diagnosis not present

## 2023-04-09 DIAGNOSIS — E785 Hyperlipidemia, unspecified: Secondary | ICD-10-CM | POA: Diagnosis present

## 2023-04-09 DIAGNOSIS — K922 Gastrointestinal hemorrhage, unspecified: Secondary | ICD-10-CM | POA: Diagnosis not present

## 2023-04-09 DIAGNOSIS — N1831 Chronic kidney disease, stage 3a: Secondary | ICD-10-CM | POA: Diagnosis present

## 2023-04-09 DIAGNOSIS — F17210 Nicotine dependence, cigarettes, uncomplicated: Secondary | ICD-10-CM

## 2023-04-09 DIAGNOSIS — E1151 Type 2 diabetes mellitus with diabetic peripheral angiopathy without gangrene: Secondary | ICD-10-CM | POA: Diagnosis present

## 2023-04-09 DIAGNOSIS — R338 Other retention of urine: Secondary | ICD-10-CM | POA: Diagnosis present

## 2023-04-09 DIAGNOSIS — K921 Melena: Secondary | ICD-10-CM | POA: Diagnosis present

## 2023-04-09 DIAGNOSIS — E1122 Type 2 diabetes mellitus with diabetic chronic kidney disease: Secondary | ICD-10-CM | POA: Diagnosis present

## 2023-04-09 DIAGNOSIS — Z681 Body mass index (BMI) 19 or less, adult: Secondary | ICD-10-CM | POA: Diagnosis not present

## 2023-04-09 DIAGNOSIS — H409 Unspecified glaucoma: Secondary | ICD-10-CM | POA: Diagnosis present

## 2023-04-09 DIAGNOSIS — E43 Unspecified severe protein-calorie malnutrition: Secondary | ICD-10-CM | POA: Diagnosis present

## 2023-04-09 HISTORY — PX: ENTEROSCOPY: SHX5533

## 2023-04-09 HISTORY — PX: HOT HEMOSTASIS: SHX5433

## 2023-04-09 HISTORY — PX: GIVENS CAPSULE STUDY: SHX5432

## 2023-04-09 LAB — BASIC METABOLIC PANEL
Anion gap: 9 (ref 5–15)
BUN: 28 mg/dL — ABNORMAL HIGH (ref 8–23)
CO2: 22 mmol/L (ref 22–32)
Calcium: 8.8 mg/dL — ABNORMAL LOW (ref 8.9–10.3)
Chloride: 106 mmol/L (ref 98–111)
Creatinine, Ser: 1.39 mg/dL — ABNORMAL HIGH (ref 0.61–1.24)
GFR, Estimated: 51 mL/min — ABNORMAL LOW (ref >60)
Glucose, Bld: 82 mg/dL (ref 70–99)
Potassium: 4.7 mmol/L (ref 3.5–5.1)
Sodium: 137 mmol/L (ref 135–145)

## 2023-04-09 LAB — CBC
HCT: 23.1 % — ABNORMAL LOW (ref 39.0–52.0)
Hemoglobin: 7.2 g/dL — ABNORMAL LOW (ref 13.0–17.0)
MCH: 24.2 pg — ABNORMAL LOW (ref 26.0–34.0)
MCHC: 31.2 g/dL (ref 30.0–36.0)
MCV: 77.8 fL — ABNORMAL LOW (ref 80.0–100.0)
Platelets: 303 10*3/uL (ref 150–400)
RBC: 2.97 MIL/uL — ABNORMAL LOW (ref 4.22–5.81)
RDW: 19.9 % — ABNORMAL HIGH (ref 11.5–15.5)
WBC: 5.9 10*3/uL (ref 4.0–10.5)
nRBC: 0 % (ref 0.0–0.2)

## 2023-04-09 LAB — TYPE AND SCREEN: Unit division: 0

## 2023-04-09 LAB — BPAM RBC
Blood Product Expiration Date: 202405182359
ISSUE DATE / TIME: 202404182211
Unit Type and Rh: 5100
Unit Type and Rh: 5100

## 2023-04-09 LAB — PREPARE RBC (CROSSMATCH)

## 2023-04-09 SURGERY — ENTEROSCOPY
Anesthesia: Monitor Anesthesia Care

## 2023-04-09 MED ORDER — CHLORHEXIDINE GLUCONATE CLOTH 2 % EX PADS
6.0000 | MEDICATED_PAD | Freq: Every day | CUTANEOUS | Status: DC
  Administered 2023-04-09 – 2023-04-13 (×5): 6 via TOPICAL

## 2023-04-09 MED ORDER — PROPOFOL 500 MG/50ML IV EMUL
INTRAVENOUS | Status: DC | PRN
  Administered 2023-04-09: 100 ug/kg/min via INTRAVENOUS

## 2023-04-09 MED ORDER — LACTATED RINGERS IV SOLN: INTRAVENOUS | Status: DC | PRN

## 2023-04-09 MED ORDER — SODIUM CHLORIDE 0.9 % IV SOLN
250.0000 mg | Freq: Once | INTRAVENOUS | Status: AC
  Administered 2023-04-09: 250 mg via INTRAVENOUS
  Filled 2023-04-09: qty 20

## 2023-04-09 MED ORDER — PROPOFOL 1000 MG/100ML IV EMUL
INTRAVENOUS | Status: AC
  Filled 2023-04-09: qty 200

## 2023-04-09 MED ORDER — SODIUM CHLORIDE 0.9% IV SOLUTION: Freq: Once | INTRAVENOUS | Status: DC

## 2023-04-09 MED ORDER — PROPOFOL 10 MG/ML IV BOLUS
INTRAVENOUS | Status: DC | PRN
  Administered 2023-04-09: 20 mg via INTRAVENOUS
  Administered 2023-04-09: 50 mg via INTRAVENOUS
  Administered 2023-04-09: 30 mg via INTRAVENOUS

## 2023-04-09 MED ORDER — LIDOCAINE 2% (20 MG/ML) 5 ML SYRINGE
INTRAMUSCULAR | Status: DC | PRN
  Administered 2023-04-09: 60 mg via INTRAVENOUS

## 2023-04-09 SURGICAL SUPPLY — 1 items: TOWEL COTTON PACK 4EA (MISCELLANEOUS) ×2 IMPLANT

## 2023-04-09 NOTE — Interval H&P Note (Signed)
History and Physical Interval Note: Patient received PRBC overnight, Hgb in the 7s. He denies any bleeding symptoms. History of numerous AVMS in his stomach and small bowel in the past. Here for enteroscopy this AM. I have discussed risks / benefits of the exam and anesthesia today. Risks for bleeding, etc. He wishes to proceed. He is prepped in case capsule endoscopy is needed post enteroscopy, to determine extent / burden of AVMs. He wishes to proceed, all questions answered. Further recommendations pending the results  04/09/2023 9:23 AM  Russell [name]  has presented today for surgery, with the diagnosis of Iron deficiency anemia, history of AVMs in the small bowel.  The various methods of treatment have been discussed with the patient and family. After consideration of risks, benefits and other options for treatment, the patient has consented to  Procedure(s): ENTEROSCOPY (N/A) GIVENS CAPSULE STUDY (N/A) as a surgical intervention.  The patient's history has been reviewed, patient examined, no change in status, stable for surgery.  I have reviewed the patient's chart and labs.  Questions were answered to the patient's satisfaction.     Russell [name]

## 2023-04-09 NOTE — Op Note (Signed)
[facility] Patient Name: Russell [name] Procedure Date : 04/09/2023 MRN: [id number] Attending MD: Willaim Rayas. Adela Lank , MD, [id number] Date of Birth: 1941-05-20 CSN: [id number] Age: 82 Admit Type: Inpatient Procedure:                Small bowel enteroscopy Indications:              Anemia secondary to chronic blood loss, history of                            gastric / small bowel AVMs Providers:                Willaim Rayas. Adela Lank, MD, Adolph Pollack, RN,                            Priscella Mann, Technician Referring MD:              Medicines:                Monitored Anesthesia Care Complications:            No immediate complications. Estimated blood loss:                            Minimal. Estimated Blood Loss:     Estimated blood loss was minimal. Procedure:                Pre-Anesthesia Assessment:                           - Prior to the procedure, a History and Physical                            was performed, and patient medications and                            allergies were reviewed. The patient's tolerance of                            previous anesthesia was also reviewed. The risks                            and benefits of the procedure and the sedation                            options and risks were discussed with the patient.                            All questions were answered, and informed consent                            was obtained. Prior Anticoagulants: The patient has                            taken no anticoagulant or antiplatelet agents. ASA  Grade Assessment: III - A patient with severe                            systemic disease. After reviewing the risks and                            benefits, the patient was deemed in satisfactory                            condition to undergo the procedure.                           After obtaining informed consent, the endoscope was                             passed under direct vision. Throughout the                            procedure, the patient's blood pressure, pulse, and                            oxygen saturations were monitored continuously. The                            PCF-H190TL ([id number] Olympus slim colonoscope was                            introduced through the mouth and advanced to the                            proximal jejunum. The small bowel enteroscopy was                            accomplished without difficulty. The patient                            tolerated the procedure well. Scope In: Scope Out: Findings:      Esophagogastric landmarks were identified: the Z-line was found at 40       cm, the gastroesophageal junction was found at 40 cm and the upper       extent of the gastric folds was found at 42 cm from the incisors.      A 2 cm hiatal hernia was present.      The exam of the esophagus was otherwise normal.      Four small angiodysplastic lesions were found in the cardia and in the       gastric fundus. One of these had active oozing and with old heme in       close approximation. Fulguration to stop the bleeding by argon plasma       was successful.      The exam of the stomach was otherwise normal.      Three angiodysplastic lesions with no bleeding were found in the second       portion of the duodenum and in the third portion of the duodenum.  Fulguration to ablate the lesion to prevent bleeding by argon plasma was       successful.      The exam of the duodenum was otherwise normal.      Two angiodysplastic lesions with no bleeding were found in the proximal       jejunum (one near prior tattoo). Fulguration to ablate the lesion to       prevent bleeding by argon plasma was successful.      A tattoo was seen in the proximal jejunum.      Exam of the jejunum was otherwise normal. Impression:               - Esophagogastric landmarks identified.                           - 2 cm hiatal hernia.                            - Normal esophagus otherwise.                           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 Treated with                            argon plasma coagulation (APC).                           - Three non-bleeding angiodysplastic lesions in the                            duodenum. Treated with argon plasma coagulation                            (APC).                           - Two non-bleeding angiodysplastic lesions in the                            duodenum. Treated with argon plasma coagulation                            (APC).                           - A tattoo was seen in the jejunum.                           - Normal exam otherwise.                           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 Patient had drank                            prep, ready for capsule endoscopy to clear his more                            distal small bowel beyond tattoo to assess AVM                            burden and risk for recurrent anemia. Recommendation:           - Return patient to hospital ward for ongoing care.                           - Place capsule endoscopy to clear small bowel                            beyond tattoo                           - Advance diet per capsule protocol (nothing for 2                            hours, then clear liquids for a few hours, then                            solids later today)                           - Continue present medications.                           - Await course post ablation of AVMs and capsule                            endoscopy results                           - Dr. Elnoria Howard covering this weekend and will reassess                            the patient                           - Call with questions Procedure Code(s):        --- Professional ---                            [phone], Small intestinal endoscopy, enteroscopy                            beyond second portion of duodenum, not including                            ileum; with control of bleeding (eg, injection,  bipolar cautery, unipolar cautery, laser, heater                            probe, stapler, plasma coagulator) Diagnosis Code(s):        --- Professional ---                           K44.9, Diaphragmatic hernia without obstruction or                            gangrene                           K31.811, Angiodysplasia of stomach and duodenum                            with bleeding                           D50.0, Iron deficiency anemia secondary to blood                            loss (chronic)                           K55.20, Angiodysplasia of colon without hemorrhage CPT copyright 2022 American [employer]. All rights reserved. The codes documented in this report are preliminary and upon coder review may  be revised to meet current compliance requirements. Viviann Spare P. Rafferty Postlewait, MD 04/09/2023 10:17:57 AM This report has been signed electronically. Number of Addenda: 0

## 2023-04-09 NOTE — Progress Notes (Signed)
Pt becoming very agitated and will not answer questions. Unable to obtain a SP02 reading on patient finger, attempted ear, pt shook head and states he didn't want that thing on his ear. Pt refuses to answer my questions says that all should be in computer. Anesthesia made aware KMB

## 2023-04-09 NOTE — Plan of Care (Signed)

## 2023-04-09 NOTE — Progress Notes (Signed)
Foley removed per order, tolerated well. Patient DTV by 1610, per Dr. Ned Card, page [phone] to notify and receive order for new foley.

## 2023-04-09 NOTE — Progress Notes (Signed)
Subjective:   The patient was seen this afternoon after his enteroscopy. He states that he has had a BM, but did not notice if there was any blood in it. He is tired and is irritated that people keep interrupting his sleep. Having some mild diffuse abdominal pain.   Objective:  Vital signs in last 24 hours: Vitals:   04/08/23 2211 04/08/23 2235 04/09/23 0200 04/09/23 0625  BP: 105/63 (!) 114/53 (!) 93/52 134/72  Pulse: 64 69 63 62  Resp: Temp: 98.6 F (37 C) 98.8 F (37.1 C) 98.4 F (36.9 C) 98.3 F (36.8 C)  TempSrc: Oral Oral Oral Oral  SpO2: 100% 100% 100% 97%  Weight:      Height:       Physical Exam: Constitutional: appears uncomfortable, irritable Cardiovascular: Regular rate, regular rhythm. No murmurs, rubs, or gallops. Normal radial and PT pulses bilaterally. No LE edema.  Pulmonary: Normal respiratory effort. No wheezes, rales, rhonchi, or crackles.   Abdominal: Soft. Non-distended. Mild diffuse abdominal tenderness. Normal bowel sounds.  Musculoskeletal: Normal range of motion.     Neurological: Alert and oriented to person, place, and time. Non-focal. Skin: warm and dry.    Assessment/Plan:  Principal Problem:   Acute on chronic blood loss anemia  Russell [name] is a 82 y/o male with past medical history of T2DM, CKD3A, IDA, prior GI bleed w/ known AVMs of the duodenoum, GERD, PAD, BPH, chronic pain, glaucoma, tobacco use that presents for lightheadedness and was admitted for acute on chronic blood loss anemia.    #Acute on chronic blood loss anemia #Hx of GI bleed w/ known duodenal AVMs #Hx of IDA Recent discharge for acute symptomatic blood loss anemia  secondary to upper GI bleed. Instructed to hold plavix at discharge but appears patient has been taking plavix/aspirin at home. Hgb 6.2 on admission. Received 1 unit pRBC. Hgb improved to 7.2 this morning. Remains HDS.  Underwent small bowel enteroscopy with GI today, notable for AVMs treated w/  APC.  Plan for pill endoscopy. Per GI, advance diet per capsule protocol (nothing for 2 hours, clear liquids for a few hours, solids later today). Will start IV iron given recently low iron.  -Appreciate GI recs -Hold aspirin and plavix -IV protonix 40 mg BID -1X IV ferrlecit 250 mg  -Transfuse if Hgb < 7 -Trend CBC -Pending pill endoscopy results   #AKI on CKD3A Baseline Cr ~ 1.1.  Cr 1.88 on admission. Likely pre-renal given decreased PO intake at home. Was given LR bolus in ED. Cr improved to 1.39 today. -Encourage PO intake  -Trend BMP   #Falls #Physical deconditioning  Reports recent falls and decreased appetite at home. Falls likely 2/2 physical deconditioning.  -Home Mirtazapine 15 mg daily -PT/OT eval   #PAD #HLD Taking pravastatin 80 mg daily, aspirin 81 mg daily, and Plavix 75 mg daily at home.  -Hold aspirin/Plavix due to concern for acute GI bleed -Pravastatin 80 mg daily   #Urinary retention #BPH Taking Silodisin 4 mg daily at home. Foley placed at most recent discharge. Patient did not f/u w/ urology as outpatient as instructed. Foley cath remains in place. Will remove foley today for voiding trial. Remains afebrile w/o leukocytosis. UA negative for signs of infection.  -Remove foley catheter for voiding trial -Tamsulosin 0.4 mg daily   #T2DM A1c 5.6% 9 months ago. Currently diet controlled. CBGs remain WNL. -Repeat A1c as outpatient   #Chronic Pain Taking oxycodone-acetaminophen 10-325 mg q8  hours PRN at home. -Oxycodone 10 mg q8 hours PRN -Tylenol PRN   #Glaucoma Uses brimonidine eyedrops at home. -Brimonidine/Latanoprost eye drops   #Tobacco Use Chronic tobacco use. -Nicotine patch   #GERD Takes pantoprazole 40 mg twice daily at home. -IV protonix 40 mg BID     Diet: advance diet per capsule protocol  Bowel: miralax VTE: SCDs IVF: LR bolus Code: full PT/OT recs: pending     Prior to Admission Living Arrangement: at home w/  wife Anticipated Discharge Location: TBD Barriers to Discharge: continued management Dispo: Anticipated discharge in approximately less than 2 day(s).   Karoline Caldwell, MD 04/09/2023, 7:31 AM Pager: [phone] After 5pm on weekdays and 1pm on weekends: On Call pager [phone]

## 2023-04-09 NOTE — Anesthesia Postprocedure Evaluation (Signed)
Anesthesia Post Note  Patient: Russell [name]  Procedure(s) Performed: ENTEROSCOPY HOT HEMOSTASIS (ARGON PLASMA COAGULATION/BICAP) GIVENS CAPSULE STUDY     Anesthesia Type: MAC Anesthetic complications: no   No notable events documented.  Last Vitals:  Vitals:   04/09/23 1034 04/09/23 1055  BP: 119/80 (!) 119/55  Pulse: (!) 55 100  Resp: 14 16  Temp:  (!) 36.4 C  SpO2:  92%    Last Pain:  Vitals:   04/09/23 1055  TempSrc: Oral  PainSc:                  Lewie Loron

## 2023-04-09 NOTE — Transfer of Care (Signed)
Immediate Anesthesia Transfer of Care Note  Patient: Russell [name]  Procedure(s) Performed: ENTEROSCOPY HOT HEMOSTASIS (ARGON PLASMA COAGULATION/BICAP) GIVENS CAPSULE STUDY  Patient Location: PACU and Endoscopy Unit  Anesthesia Type:MAC  Level of Consciousness: sedated  Airway & Oxygen Therapy: Patient Spontanous Breathing  Post-op Assessment: Report given to RN and Post -op Vital signs reviewed and stable  Post vital signs: Reviewed and stable  Last Vitals:  Vitals Value Taken Time  BP    Temp    Pulse    Resp    SpO2      Last Pain:  Vitals:   04/09/23 0845  TempSrc: Tympanic  PainSc: 0-No pain         Complications: No notable events documented.

## 2023-04-09 NOTE — Anesthesia Preprocedure Evaluation (Addendum)
Anesthesia Evaluation  Patient identified by MRN, date of birth, ID band Patient awake    Reviewed: Allergy & Precautions, NPO status , Patient's Chart, lab work & pertinent test results  Airway Mallampati: II  TM Distance: >3 FB Neck ROM: Full    Dental  (+) Edentulous Upper, Edentulous Lower   Pulmonary COPD, Current Smoker and Patient abstained from smoking.   Pulmonary exam normal breath sounds clear to auscultation       Cardiovascular hypertension, + CAD and + Peripheral Vascular Disease  Normal cardiovascular exam+ Valvular Problems/Murmurs MR  Rhythm:Regular Rate:Normal  Echo 02/02/22:  1. Left ventricular ejection fraction, by estimation, is 45 to 50%. The left ventricle has mildly decreased function. The left ventricle demonstrates global hypokinesis. There is moderate left ventricular hypertrophy. Left ventricular diastolic parameters are indeterminate.   2. Right ventricular systolic function is normal. The right ventricular size is normal.   3. The mitral valve is myxomatous. Mild mitral valve regurgitation.   4. The aortic valve is tricuspid. Aortic valve regurgitation is not visualized. No aortic stenosis is present.     Neuro/Psych Seizures -,  PSYCHIATRIC DISORDERS  Depression     Neuromuscular disease    GI/Hepatic Neg liver ROS,GERD  Medicated,,  Endo/Other  diabetes, Type 2    Renal/GU Renal InsufficiencyRenal diseasenegative Renal ROS     Musculoskeletal  (+) Arthritis ,    Abdominal   Peds  Hematology  (+) Blood dyscrasia, anemia   Anesthesia Other Findings Day of surgery medications reviewed with the patient.  Reproductive/Obstetrics                             Anesthesia Physical Anesthesia Plan  ASA: 3  Anesthesia Plan: MAC   Post-op Pain Management: Minimal or no pain anticipated   Induction: Intravenous  PONV Risk Score and Plan: 0 and TIVA, Treatment may  vary due to age or medical condition and Propofol infusion  Airway Management Planned: Natural Airway and Simple Face Mask  Additional Equipment:   Intra-op Plan:   Post-operative Plan:   Informed Consent: I have reviewed the patients History and Physical, chart, labs and discussed the procedure including the risks, benefits and alternatives for the proposed anesthesia with the patient or authorized representative who has indicated his/her understanding and acceptance.     Dental advisory given  Plan Discussed with: CRNA  Anesthesia Plan Comments:        Anesthesia Quick Evaluation

## 2023-04-09 NOTE — Progress Notes (Signed)
[facility] capsule endoscopy ordered by MD Armbruster.  Patient ingested capsule at 1030.  Per Given's capsule instructions, patient to remain NPO until 1230 at which time they may progress to clear liquid diet. At 1430 patient may have a small snack such as a half a sandwich or a bowl of soup. At 1830 patient may progress to previously ordered diet.  The capsule endoscopy study will conclude at 2230 at which time the recorder and leads or belt can be removed and placed in a patient belongings bag. Endoscopy staff will pick up the equipment in the AM.  Instructions provided to patient and inpatient RN, Raquel. Patient and RN demonstrated understanding.

## 2023-04-09 NOTE — Progress Notes (Signed)
PT Cancellation Note  Patient Details Name: Russell [name] MRN: [id number] DOB: 1941-07-23   Cancelled Treatment:    Reason Eval/Treat Not Completed: Other (comment)  Patient states he does not want evaluated by physical therapy during admission. Seems agitated with my presence and questions about his functional baseline. Explained role of therapies in acute setting and offerings for assessment and recommendations at d/c. Declines to discuss further.  Pt reports lives with wife and daughter. Independent at baseline, single level home with 4 steps bil rails to enter home. Has a walk in shower, shower chair, accessible bathroom. Hover-round and SPC. Reports multiple falls at home. Drives.  Signing-off.   Kathlyn Sacramento, PT, DPT Physical Therapist Acute [facility]  Berton Mount 04/09/2023, 11:36 AM

## 2023-04-10 LAB — BASIC METABOLIC PANEL
Anion gap: 10 (ref 5–15)
BUN: 22 mg/dL (ref 8–23)
CO2: 19 mmol/L — ABNORMAL LOW (ref 22–32)
Calcium: 8.4 mg/dL — ABNORMAL LOW (ref 8.9–10.3)
Chloride: 105 mmol/L (ref 98–111)
Creatinine, Ser: 1.33 mg/dL — ABNORMAL HIGH (ref 0.61–1.24)
GFR, Estimated: 53 mL/min — ABNORMAL LOW (ref >60)
Glucose, Bld: 85 mg/dL (ref 70–99)
Potassium: 4.1 mmol/L (ref 3.5–5.1)
Sodium: 134 mmol/L — ABNORMAL LOW (ref 135–145)

## 2023-04-10 LAB — CBC
HCT: 25.9 % — ABNORMAL LOW (ref 39.0–52.0)
Hemoglobin: 7.6 g/dL — ABNORMAL LOW (ref 13.0–17.0)
MCH: 23.8 pg — ABNORMAL LOW (ref 26.0–34.0)
MCHC: 29.3 g/dL — ABNORMAL LOW (ref 30.0–36.0)
MCV: 81.2 fL (ref 80.0–100.0)
Platelets: 344 10*3/uL (ref 150–400)
RBC: 3.19 MIL/uL — ABNORMAL LOW (ref 4.22–5.81)
RDW: 20.3 % — ABNORMAL HIGH (ref 11.5–15.5)
WBC: 7.7 10*3/uL (ref 4.0–10.5)
nRBC: 0.3 % — ABNORMAL HIGH (ref 0.0–0.2)

## 2023-04-10 MED ORDER — ACETAMINOPHEN 500 MG PO TABS
1000.0000 mg | ORAL_TABLET | Freq: Three times a day (TID) | ORAL | Status: DC | PRN
  Administered 2023-04-10 – 2023-04-11 (×2): 1000 mg via ORAL
  Filled 2023-04-10 (×2): qty 2

## 2023-04-10 MED ORDER — SODIUM CHLORIDE 0.9 % IV SOLN
250.0000 mg | Freq: Once | INTRAVENOUS | Status: AC
  Administered 2023-04-10: 250 mg via INTRAVENOUS
  Filled 2023-04-10: qty 20

## 2023-04-10 NOTE — Progress Notes (Signed)
OT Cancellation Note  Patient Details Name: DELORES THELEN MRN: [id number] DOB: [birth year]   Cancelled Treatment:     refused OT evaluation  Toyna Erisman 04/10/2023, 12:33 PM

## 2023-04-10 NOTE — Progress Notes (Signed)
HD#2 SUBJECTIVE:  Patient Summary: Russell [name] is a 82 y/o male with past medical history of T2DM, CKD3A, IDA, prior GI bleed w/ known AVMs of the duodenoum, GERD, PAD, BPH, chronic pain, glaucoma, tobacco use that presents for lightheadedness and was admitted for acute on chronic blood loss anemia   Overnight Events: No acute events overnight  Interim History: The patient denies any abd pain, nausea, or vomiting. He has not noticed any melena or hematochezia. Feels fine and is irritated with the frequent interruptions/disturbances.   OBJECTIVE:  Vital Signs: Vitals:   04/09/23 1055 04/09/23 1600 04/09/23 2058 04/10/23 0531  BP: (!) 119/55 (!) 111/59 107/62 (!) 104/52  Pulse: 100 67 75 60  Resp: Temp: (!) 97.5 F (36.4 C) 98.7 F (37.1 C) 98.3 F (36.8 C) 98.6 F (37 C)  TempSrc: Oral Oral Oral Oral  SpO2: 92% 100% 100% 100%  Weight:      Height:       Supplemental O2: Room Air SpO2: 100 %  Filed Weights   04/08/23 2054  Weight: 49 kg     Intake/Output Summary (Last 24 hours) at 04/10/2023 1610 Last data filed at 04/10/2023 0300 Gross per 24 hour  Intake 602.63 ml  Output 900 ml  Net -297.37 ml   Net IO Since Admission: -282.37 mL [04/10/23 0623]  Physical Exam: General: No acute distress. CV: RRR. No murmurs.  Pulmonary: Lungs CTAB. Normal effort.  Abdominal: Soft, nontender, nondistended.  Skin: Warm and dry. No obvious rash or lesions. Neuro: A&Ox3. Moves all extremities. Normal sensation. No focal deficit. Psych: Normal mood and affect   Patient Lines/Drains/Airways Status     Active Line/Drains/Airways     Name Placement date Placement time Site Days   Peripheral IV 04/08/23 20 G Right Forearm 04/08/23  1632  Forearm  2   Urethral Catheter Clydie Braun kennedy 14 Fr. 04/09/23  2200  --  1             ASSESSMENT/PLAN:  Assessment: Principal Problem:   Acute on chronic blood loss anemia Active Problems:   Gastric AVM   AVM  (arteriovenous malformation) of small bowel, acquired   Plan: #Acute on chronic blood loss anemia #Hx of GI bleed w/ known duodenal AVMs #Hx of IDA Underwent small bowel enteroscopy on 4/19 with GI, showed AVMs treated with APC. Capsule pill endoscopy showed more bleeding AVMs, but they are not able to be reached by scope per GI.  Received 1 dose of IV Ferrlecit yesterday. Hb stable at 7.6 today.  - GI consulted, appreciate recs - Hold ASA/plavix, no indication for DAPT as far as I can see - IV protonix 40 mg BID - IV iron x 1 dose again today - If Hb stable tomorrow, can likely discharge to home  #AKI on CKD3A  Cr 1.1 at baseline, up to 1.88 on admission. Prerenal in the setting of decreased PO intake. Cr improved to 1.33 after getting IVF and increasing PO intake. - Daily BMP - Encourage PO intake  #Urinary retention #BPH Patient had foley placed at last hospitalization and was supposed to follow up with urology for foley removal. The patient's wife states that she believes he did go to that appt, however, she is unsure what was discussed and if he was supposed to continue wearing the foley or not. Voiding trial was attempted yesterday, but unsuccessful. - Continue foley, will again need urology follow up as outpatient - Tamsulosin 0.4 mg daily (  takes silodosin at home)  #Falls #Physical deconditioning  Reports recent falls and decreased appetite at home. Falls likely 2/2 physical deconditioning.  - Continue home Mirtazapine 15 mg daily - PT/OT eval and treat  Best Practice: Diet: Regular diet IVF: Fluids: none VTE: SCDs Start: 04/08/23 1821 Code: Full AB: none Therapy Recs:  Patient declined PT Family Contact: Wife, to be notified. DISPO: Anticipated discharge tomorrow to Home pending  medical stability .  Signature: Elza Rafter, D.O.  Internal Medicine Resident, PGY-2 [facility]  Pager: [phone] 6:23 AM, 04/10/2023   Please contact  the on call pager after 5 pm and on weekends at [phone].

## 2023-04-11 ENCOUNTER — Inpatient Hospital Stay (HOSPITAL_COMMUNITY): Payer: 59

## 2023-04-11 DIAGNOSIS — D62 Acute posthemorrhagic anemia: Secondary | ICD-10-CM | POA: Diagnosis not present

## 2023-04-11 LAB — HEMOGLOBIN AND HEMATOCRIT, BLOOD
HCT: 24.5 % — ABNORMAL LOW (ref 39.0–52.0)
Hemoglobin: 7.7 g/dL — ABNORMAL LOW (ref 13.0–17.0)

## 2023-04-11 LAB — TYPE AND SCREEN
ABO/RH(D): O POS
Unit division: 0

## 2023-04-11 LAB — BASIC METABOLIC PANEL
Anion gap: 9 (ref 5–15)
BUN: 18 mg/dL (ref 8–23)
CO2: 22 mmol/L (ref 22–32)
Calcium: 8.5 mg/dL — ABNORMAL LOW (ref 8.9–10.3)
Chloride: 105 mmol/L (ref 98–111)
Creatinine, Ser: 1.37 mg/dL — ABNORMAL HIGH (ref 0.61–1.24)
GFR, Estimated: 52 mL/min — ABNORMAL LOW (ref >60)
Glucose, Bld: 122 mg/dL — ABNORMAL HIGH (ref 70–99)
Potassium: 4.1 mmol/L (ref 3.5–5.1)
Sodium: 136 mmol/L (ref 135–145)

## 2023-04-11 LAB — CBC
HCT: 20 % — ABNORMAL LOW (ref 39.0–52.0)
Hemoglobin: 6.3 g/dL — CL (ref 13.0–17.0)
MCH: 24.6 pg — ABNORMAL LOW (ref 26.0–34.0)
MCHC: 31.5 g/dL (ref 30.0–36.0)
MCV: 78.1 fL — ABNORMAL LOW (ref 80.0–100.0)
Platelets: 283 10*3/uL (ref 150–400)
RBC: 2.56 MIL/uL — ABNORMAL LOW (ref 4.22–5.81)
RDW: 20.4 % — ABNORMAL HIGH (ref 11.5–15.5)
WBC: 8.4 10*3/uL (ref 4.0–10.5)
nRBC: 0.2 % (ref 0.0–0.2)

## 2023-04-11 LAB — BPAM RBC

## 2023-04-11 LAB — PREPARE RBC (CROSSMATCH)

## 2023-04-11 MED ORDER — OCTREOTIDE ACETATE 100 MCG/ML IJ SOLN
300.0000 ug | Freq: Three times a day (TID) | INTRAMUSCULAR | Status: DC
  Administered 2023-04-11: 300 ug via SUBCUTANEOUS
  Filled 2023-04-11 (×3): qty 3

## 2023-04-11 MED ORDER — OCTREOTIDE ACETATE 100 MCG/ML IJ SOLN
100.0000 ug | Freq: Three times a day (TID) | INTRAMUSCULAR | Status: DC
  Administered 2023-04-11 – 2023-04-13 (×6): 100 ug via SUBCUTANEOUS
  Filled 2023-04-11 (×8): qty 1

## 2023-04-11 MED ORDER — OCTREOTIDE ACETATE 100 MCG/ML IJ SOLN: 100.0000 ug | Freq: Three times a day (TID) | INTRAMUSCULAR | Status: DC

## 2023-04-11 MED ORDER — IOHEXOL 350 MG/ML SOLN
75.0000 mL | Freq: Once | INTRAVENOUS | Status: AC | PRN
  Administered 2023-04-11: 75 mL via INTRAVENOUS

## 2023-04-11 MED ORDER — SODIUM CHLORIDE 0.9% IV SOLUTION: Freq: Once | INTRAVENOUS | Status: AC

## 2023-04-11 NOTE — Progress Notes (Signed)
Called CT about CT Angio that is ordered. They will get the pt within 1 hour so we will hold on starting the blood until he comes back from CT so it wont be interrupted in any way.

## 2023-04-11 NOTE — Progress Notes (Signed)
Subjective: No complaints.  No reports of any gross bleeding.  Objective: Vital signs in last 24 hours: Temp:  [98 F (36.7 C)-98.4 F (36.9 C)] 98 F (36.7 C) (04/21 0947) Pulse Rate:  [60-66] 60 (04/21 0947) Resp:  [16-18] 16 (04/21 0947) BP: (99-117)/(48-66) 107/52 (04/21 0947) SpO2:  [98 %-100 %] 100 % (04/21 0947) Last BM Date : 04/09/23  Intake/Output from previous day: 04/20 0701 - 04/21 0700 In: -  Out: 475 [Urine:475] Intake/Output this shift: No intake/output data recorded.  General appearance: alert and no distress GI: soft, non-tender; bowel sounds normal; no masses,  no organomegaly  Lab Results: Recent Labs    04/09/23 0735 04/10/23 0657 04/11/23 0355  WBC 5.9 7.7 8.4  HGB 7.2* 7.6* 6.3*  HCT 23.1* 25.9* 20.0*  PLT 303 344 283   BMET Recent Labs    04/09/23 0735 04/10/23 0657 04/11/23 0355  NA 137 134* 136  K 4.7 4.1 4.1  CL 106 105 105  CO2 22 19* 22  GLUCOSE 82 85 122*  BUN 28* 22 18  CREATININE 1.39* 1.33* 1.37*  CALCIUM 8.8* 8.4* 8.5*   LFT Recent Labs    04/08/23 1400  PROT 7.1  ALBUMIN 4.2  AST 14*  ALT 16  ALKPHOS 40  BILITOT 0.1*   PT/INR No results for input(s): "LABPROT", "INR" in the last 72 hours. Hepatitis Panel No results for input(s): "HEPBSAG", "HCVAB", "HEPAIGM", "HEPBIGM" in the last 72 hours. C-Diff No results for input(s): "CDIFFTOX" in the last 72 hours. Fecal Lactopherrin No results for input(s): "FECLLACTOFRN" in the last 72 hours.  Studies/Results: No results found.  Medications: Scheduled:  sodium chloride   Intravenous Once   sodium chloride   Intravenous Once   brinzolamide  1 drop Both Eyes TID   And   brimonidine  1 drop Both Eyes TID   Chlorhexidine Gluconate Cloth  6 each Topical Daily   latanoprost  1 drop Both Eyes QHS   mirtazapine  15 mg Oral QHS   nicotine  7 mg Transdermal Daily   octreotide  100 mcg Subcutaneous TID   octreotide  300 mcg Subcutaneous TID   pantoprazole (PROTONIX)  IV  40 mg Intravenous Q12H   pravastatin  80 mg Oral Daily   tamsulosin  0.4 mg Oral Daily   Continuous:  Assessment/Plan: 1) Small bowel AVM bleeding. 2) Anemia.   The VCE yesterday was positive for a couple or several mid small bowel AVMs.  They were felt to be too far distally to reach with a standard push enteroscopy.  Overnight his HGB continues to decline.  Currently he is asymptomatic.  Plan: 1) Trial of Octreotide.  This can slow down the bleeding, but the problem is medication coverage as an outpatient. 2) Monitor HGB and transfuse as necessary. 3) [facility] will resume care in the AM.  LOS: 2 days   Russell [name] D 04/11/2023, 11:50 AM

## 2023-04-11 NOTE — Progress Notes (Signed)
IR brief note   Updated CTA GI bleed personally reviewed. No findings of active GI bleeding or further localization of GI bleeding. In the absence of a positive CTA, angiography is unlikely to offer any benefit at this time.   Olive Bass, MD  Vascular and Interventional Radiology 04/11/2023 6:59 PM

## 2023-04-11 NOTE — Progress Notes (Signed)
Pt has been educated over and over about the his safety and why we need to place floor mats down but he refused on different occasion.

## 2023-04-11 NOTE — Progress Notes (Addendum)
HD#2 SUBJECTIVE:  Patient Summary: Russell [name] is a 82 y/o male with past medical history of T2DM, CKD3A, IDA, prior GI bleed w/ known AVMs of the duodenoum, GERD, PAD, BPH, chronic pain, glaucoma, tobacco use that presents for lightheadedness and was admitted for acute on chronic blood loss anemia 2/2 AVMs  Overnight Events: Hb dropped overnight to 6.3, received 1u PRBC.  Interim History: The patient has not had a bowel movement since he has been in the hospital. He continues to endorse dizziness/lightheadedness when standing and ambulating in his room. The patient denies any chest pain, SOB, abd pain, n/v/d.   OBJECTIVE:  Vital Signs: Vitals:   04/10/23 0729 04/10/23 1547 04/10/23 2149 04/11/23 0414  BP: 118/67 99/66 (!) 117/48 (!) 105/58  Pulse: 78 65 63 66  Resp: Temp: (!) 97.5 F (36.4 C) 98.2 F (36.8 C) 98.4 F (36.9 C) 98.2 F (36.8 C)  TempSrc: Oral Oral Oral Oral  SpO2: 99% 100% 99% 98%  Weight:      Height:       Supplemental O2: Room Air SpO2: 98 %  Filed Weights   04/08/23 2054  Weight: 49 kg     Intake/Output Summary (Last 24 hours) at 04/11/2023 5409 Last data filed at 04/10/2023 0920 Gross per 24 hour  Intake --  Output 475 ml  Net -475 ml   Net IO Since Admission: -757.37 mL [04/11/23 0608]  Physical Exam: General: Chronically ill-appearing male laying in bed. No acute distress. CV: RRR. No murmurs. No LE edema Pulmonary: Lungs clear to auscultation anteriorly. Normal work of breathing. Abdominal: Soft, nontender, nondistended.  Neuro: A&Ox3. Moves all extremities. Normal sensation. No focal deficit. Psych: Normal mood and affect   ASSESSMENT/PLAN:  Assessment: Principal Problem:   Acute on chronic blood loss anemia Active Problems:   Gastric AVM   AVM (arteriovenous malformation) of small bowel, acquired   Plan: #Acute on chronic blood loss anemia #Hx of GI bleed w/ known duodenal AVMs #Hx of IDA Underwent small  bowel enteroscopy on 4/19 with GI, showed AVMs treated with APC. Capsule pill endoscopy showed more bleeding AVMs, but they are not able to be reached by scope per GI. He is s/p 2 doses of IV Ferrlecit, however, Hb dropped to 6.3 this morning. 1u PRBC ordered by night team.  - appreciate GI recommendations - consult IR for possible AVM embolization, but need to obtain updated CTA GI bleed to find possible target(s) - start subcutaneous octreotide 300 mg TID per GI - follow up post transfusion CBC - continue to hold DAPT - IV protonix 40 mg BID  #AKI on CKD3a Cr remains stable at 1.37 today. AKI is prerenal secondary to decreased PO intake and hypovolemia. - Daily BMP  #Urinary retention #BPH Patient had foley placed at last hospitalization and was supposed to follow up with urology for foley removal. The patient's wife states that she believes he did go to that appt, however, she is unsure what was discussed and if he was supposed to continue wearing the foley or not. Voiding trial attempted on 4/19, however, this was unsuccessful. Will leave foley catheter in, and again have patient follow up with urology as an outpatient. - Tamsulosin 0.4 mg daily  Best Practice: Diet: Regular diet IVF: Fluids: none VTE: SCDs Start: 04/08/23 1821 Code: Full AB: none Therapy Recs: None Family Contact: Wife, to be notified. DISPO: Anticipated discharge to Home pending  medical stability .  Signature: Elza Rafter,  D.O.  Internal Medicine Resident, PGY-2 [facility]  Pager: [phone] 6:08 AM, 04/11/2023   Please contact the on call pager after 5 pm and on weekends at [phone].

## 2023-04-12 ENCOUNTER — Other Ambulatory Visit (HOSPITAL_COMMUNITY): Payer: Self-pay

## 2023-04-12 ENCOUNTER — Telehealth (HOSPITAL_COMMUNITY): Payer: Self-pay | Admitting: Pharmacy Technician

## 2023-04-12 DIAGNOSIS — K922 Gastrointestinal hemorrhage, unspecified: Secondary | ICD-10-CM

## 2023-04-12 DIAGNOSIS — K5520 Angiodysplasia of colon without hemorrhage: Secondary | ICD-10-CM

## 2023-04-12 DIAGNOSIS — D62 Acute posthemorrhagic anemia: Secondary | ICD-10-CM | POA: Diagnosis not present

## 2023-04-12 LAB — BASIC METABOLIC PANEL
Anion gap: 9 (ref 5–15)
BUN: 17 mg/dL (ref 8–23)
CO2: 20 mmol/L — ABNORMAL LOW (ref 22–32)
Calcium: 8.5 mg/dL — ABNORMAL LOW (ref 8.9–10.3)
Chloride: 109 mmol/L (ref 98–111)
Creatinine, Ser: 1.34 mg/dL — ABNORMAL HIGH (ref 0.61–1.24)
GFR, Estimated: 53 mL/min — ABNORMAL LOW (ref >60)
Glucose, Bld: 93 mg/dL (ref 70–99)
Potassium: 4.3 mmol/L (ref 3.5–5.1)
Sodium: 138 mmol/L (ref 135–145)

## 2023-04-12 LAB — CBC
HCT: 26.9 % — ABNORMAL LOW (ref 39.0–52.0)
Hemoglobin: 8.1 g/dL — ABNORMAL LOW (ref 13.0–17.0)
MCH: 24.9 pg — ABNORMAL LOW (ref 26.0–34.0)
MCHC: 30.1 g/dL (ref 30.0–36.0)
MCV: 82.8 fL (ref 80.0–100.0)
Platelets: 282 10*3/uL (ref 150–400)
RBC: 3.25 MIL/uL — ABNORMAL LOW (ref 4.22–5.81)
RDW: 20.3 % — ABNORMAL HIGH (ref 11.5–15.5)
WBC: 10.3 10*3/uL (ref 4.0–10.5)
nRBC: 0 % (ref 0.0–0.2)

## 2023-04-12 LAB — BPAM RBC
Blood Product Expiration Date: 202405172359
Unit Type and Rh: 5100

## 2023-04-12 LAB — TYPE AND SCREEN: Antibody Screen: NEGATIVE

## 2023-04-12 MED ORDER — ENSURE ENLIVE PO LIQD
237.0000 mL | Freq: Three times a day (TID) | ORAL | Status: DC
  Administered 2023-04-13: 237 mL via ORAL

## 2023-04-12 MED ORDER — PHENOL 1.4 % MT LIQD: 1.0000 | OROMUCOSAL | Status: DC | PRN

## 2023-04-12 MED ORDER — ADULT MULTIVITAMIN W/MINERALS CH
1.0000 | ORAL_TABLET | Freq: Every day | ORAL | Status: DC
  Administered 2023-04-12 – 2023-04-13 (×2): 1 via ORAL
  Filled 2023-04-12 (×2): qty 1

## 2023-04-12 MED ORDER — PANTOPRAZOLE SODIUM 40 MG PO TBEC
40.0000 mg | DELAYED_RELEASE_TABLET | Freq: Two times a day (BID) | ORAL | Status: DC
  Administered 2023-04-12 – 2023-04-13 (×2): 40 mg via ORAL
  Filled 2023-04-12 (×2): qty 1

## 2023-04-12 NOTE — Progress Notes (Signed)
   04/12/23 1100  SDOH Interventions  Food Insecurity Interventions Inpatient TOC;Other (Comment) (Pt declined.)   CSW met with pt at bedside to complete SDOH screen for food insecurity. Pt states " Why are you asking me these dumbass questions? Why would I live there if I couldn't get food". Pt declines resources at this time.

## 2023-04-12 NOTE — TOC Benefit Eligibility Note (Signed)
Patient Product/process development scientist completed.    The patient is currently admitted and upon discharge could be taking Octreotide Acetate 100 mcg/ml Sosy.  Prior Authorization Required   The patient is insured through [employer]  This test claim was processed through [facility]- copay amounts may vary at other pharmacies due to pharmacy/plan contracts, or as the patient moves through the different stages of their insurance plan.  Roland Earl, CPHT Pharmacy Patient Advocate Specialist [facility] Patient Advocate Team Direct Number: [phone]  Fax: [phone]

## 2023-04-12 NOTE — Progress Notes (Signed)
Initial Nutrition Assessment  DOCUMENTATION CODES:   Severe malnutrition in context of chronic illness, Underweight  INTERVENTION:  Ensure Plus High Protein po TID, each supplement provides 350 kcal and 20 grams of protein. MVI  Encourage po intake    NUTRITION DIAGNOSIS:   Severe Malnutrition related to chronic illness as evidenced by percent weight loss, energy intake < or equal to 75% for > or equal to 1 month.   GOAL:   Patient will meet greater than or equal to 90% of their needs   MONITOR:   PO intake, Labs, I & O's, Supplement acceptance, Weight trends  REASON FOR ASSESSMENT:   Malnutrition Screening Tool    ASSESSMENT:   82 y.o. male with PMHx including T2DM, CKD 3a, PAD, BPH and recent UTI, IDA, prior GIB with AVMs presents with lightheadedness and recent falls.  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 He reports his appetite is beginning to come back and that he was not eating well at home despite drinking Ensure BID.   He reports he has lost another 25# recently which RD can see in weight hx.   RD tried to look at what he got for lunch to see how much he ate, patient reported he did not want RD to look at his plate and that it was not RD's business. He reports eating most of it besides his vegetables. RD observed that all of his soup had been eaten.   Patient refused physical exam. He is accepting to Ensure TID (vanilla).   He denies N/V/D/C, trouble chewing/swallowing. He reports he has dentures  but does not wear them often. Patient reports gum pain but denies need for soft diet and states he just needs something for his gum pain to keep eating regularly.   NUTRITION - FOCUSED PHYSICAL EXAM:  Patient refused   Diet Order:   Diet Order             Diet regular Room service appropriate? No; Fluid consistency: Thin  Diet effective ____                   EDUCATION NEEDS:   Not appropriate for education at this time  Skin:  Skin Assessment: Reviewed RN Assessment  Last BM:  4/19  Height:   Ht Readings from Last 1 Encounters:  04/08/23  (1.88 m)    Weight:   Wt Readings from Last 1 Encounters:  04/08/23 49 kg    Ideal Body Weight:     BMI:  Body mass index is 13.87 kg/m.  Estimated Nutritional Needs:   Kcal:  1700-2000 kcal  Protein:  75-100 g  Fluid:  >/= 2L    Leodis Rains, RDN, LDN  Clinical Nutrition

## 2023-04-12 NOTE — Plan of Care (Signed)

## 2023-04-12 NOTE — Progress Notes (Signed)
Daily Progress Note  DOA: 04/08/2023 Hospital Day: 5 Chief Complaint: GI bleed   ASSESSMENT   82 y.o. yo male with multiple medical problems not limited to depression, aortic atherosclerosis, CHF,  COPD, seizure disorder, subdural hematomas with frequent falls, glaucoma, DM2,  CKD stage 3b, PAD, prostate cancer,, chronic IDA, upper GI tract AVMs, GERD and diverticulosis.    Recurrent small bowel AVM bleeding / iron deficiency anemia.  Official small bowel video capsule results not available but reportedly + for bleeding AVMs. He underwent small bowel endoscopy 4/19  with findings of angiodysplastic lesions of stomach and duodenum s/p APC, ( see full report below). He had been taking plavix at home despite discontinuation of the medication a couple of months ago.  -s/p IV iron x2 -s/p 2 u PRBCs with improvement in hgb from 6.3 to 8.1   PLAN   -When I saw him in clinic on Thursday we placed a referral to Hematology for IV iron . Oral iron is too constipating for him.  -Has been started on SQ Octreotide TID. This can be difficult to obtain in outpatient setting. I will contact our office about options.  -Continue BID PPI, will change to PO    Subjective / New events:  Hungry. No black stools. No abdominal pain    Objective   Capsule study 4/18 Results pending  Small bowel endoscopy 4/19 Esophagogastric landmarks identified. - 2 cm hiatal hernia. - Normal esophagus otherwise. 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 Treated with argon plasma coagulation (APC). - Three non-bleeding angiodysplastic lesions in the duodenum. Treated with argon plasma coagulation (APC). - Two non-bleeding angiodysplastic lesions in the duodenum. Treated with argon plasma coagulation (APC). - A tattoo was seen in the jejunum. - Normal exam otherwise.   Recent Labs    04/10/23 0657 04/11/23 0355 04/11/23 2027 04/12/23 0642  WBC 7.7 8.4  --  10.3   HGB 7.6* 6.3* 7.7* 8.1*  HCT 25.9* 20.0* 24.5* 26.9*  PLT 344 283  --  282   BMET Recent Labs    04/10/23 0657 04/11/23 0355 04/12/23 0642  NA 134* 136 138  K 4.1 4.1 4.3  CL 105 105 109  CO2 19* 22 20*  GLUCOSE 85 122* 93  BUN CREATININE 1.33* 1.37* 1.34*  CALCIUM 8.4* 8.5* 8.5*   LFT No results for input(s): "PROT", "ALBUMIN", "AST", "ALT", "ALKPHOS", "BILITOT", "BILIDIR", "IBILI" in the last 72 hours. PT/INR No results for input(s): "LABPROT", "INR" in the last 72 hours.   Scheduled inpatient medications:   sodium chloride   Intravenous Once   brinzolamide  1 drop Both Eyes TID   And   brimonidine  1 drop Both Eyes TID   Chlorhexidine Gluconate Cloth  6 each Topical Daily   latanoprost  1 drop Both Eyes QHS   mirtazapine  15 mg Oral QHS   nicotine  7 mg Transdermal Daily   octreotide  100 mcg Subcutaneous TID   pantoprazole (PROTONIX) IV  40 mg Intravenous Q12H   pravastatin  80 mg Oral Daily   tamsulosin  0.4 mg Oral Daily   Continuous inpatient infusions:  PRN inpatient medications: acetaminophen **OR** [DISCONTINUED] acetaminophen, oxyCODONE, phenol  Vital signs in last 24 hours: Temp:  [98 F (36.7 C)-98.8 F (37.1 C)] 98.3 F (36.8 C) (04/22 0801) Pulse Rate:  [55-69] 55 (04/22 0801) Resp:  [16-18] 16 (04/22 0801) BP: (114-142)/(52-63) 122/58 (  04/22 0801) SpO2:  [91 %-100 %] 98 % (04/22 0801) Last BM Date : 04/09/23  Intake/Output Summary (Last 24 hours) at 04/12/2023 1221 Last data filed at 04/12/2023 0500 Gross per 24 hour  Intake 542 ml  Output 1200 ml  Net -658 ml    Intake/Output from previous day: 04/21 0701 - 04/22 0700 In: 542 [P.O.:240; Blood:302] Out: 1200 [Urine:1200] Intake/Output this shift: No intake/output data recorded.   Physical Exam:  General: Alert thin male in NAD Heart:  Regular rate and rhythm.  Pulmonary: Normal respiratory effort Abdomen: Soft, nondistended, nontender. Normal bowel sounds. Neurologic:  Alert and oriented Psych: Pleasant. Cooperative. Insight appears normal.    Principal Problem:   Acute on chronic blood loss anemia Active Problems:   Gastric AVM   AVM (arteriovenous malformation) of small bowel, acquired     LOS: 3 days   Willette Cluster ,NP 04/12/2023, 12:21 PM

## 2023-04-12 NOTE — Telephone Encounter (Signed)
Patient Advocate Encounter  Prior Authorization for Octreotide Acetate 100MCG/ML syringes  has been approved.    PA# [id number] Insurance [employer] Medicare Form  Effective dates: 04/12/2023 through 12/21/2023  Patients co-pay is $0.00   Roland Earl, CPhT Pharmacy Patient Advocate Specialist [facility] Patient Advocate Team Direct Number: [phone]  Fax: [phone]

## 2023-04-12 NOTE — Telephone Encounter (Signed)
Patient Advocate Encounter   Received notification that prior authorization for Octreotide Acetate 100MCG/ML syringes is required.   PA submitted on 04/12/2023 Key [id number] Insurance [employer] Medicare Form Status is pending       Russell [name], CPhT Pharmacy Patient Advocate Specialist [facility] Patient Advocate Team Direct Number: [phone]  Fax: [phone]

## 2023-04-12 NOTE — Progress Notes (Signed)
Subjective:   Patient reports having 2 bowel movements yesterday.  Denies noticing any blood in his stool or dark stool.  Still having some generalized abdominal pain. He is unsure of how much he has eaten yesterday but reports that he does not enjoy the hospital food. Still having some lightheadedness with ambulation. Has lower back pain bilaterally today that is new.  Also reports some discomfort on the roof of his mouth.  Objective:  Vital signs in last 24 hours: Vitals:   04/11/23 1500 04/11/23 1716 04/11/23 2023 04/12/23 0350  BP: 122/63 (!) 133/58 (!) 117/52 (!) 114/54  Pulse: (!) 59 69 63 61  Resp: Temp: 98.8 F (37.1 C) 98 F (36.7 C) 98.7 F (37.1 C) 98.6 F (37 C)  TempSrc: Oral Oral Oral Oral  SpO2: 100% 100% 91% 92%  Weight:      Height:       Physical Exam: General: Chronically ill-appearing, laying in bed, appears irritable, not in acute distress. HEENT: Oropharynx is without significant erythema, bleeding, or lesions. No signs of thrush.  CV: RRR. No murmurs. No LE edema Pulmonary: Lungs clear to auscultation anteriorly. Normal work of breathing. Abdominal: Soft, nontender, nondistended.  MSK: mild paraspinal tenderness bilaterally in the lumbar region.  Neuro: A x O x3. Moves all extremities.   Assessment/Plan:  Principal Problem:   Acute on chronic blood loss anemia Active Problems:   Gastric AVM   AVM (arteriovenous malformation) of small bowel, acquired  #Acute on chronic blood loss anemia #Hx of GI bleed w/ known duodenal AVMs #Hx of IDA S/p small bowel enteroscopy on 4/19. Demonstrated AVMs, treated w/ APC. Capsule pill endoscopy following demonstrated more bleeding AVMs (not able to be reached by scope per GI). CTA abdomen yesterday did not demonstrate localization of GI bleed. S/p 2X doses of IV ferrlecit. Has received 2X units of pRBC, most recently yesterday. Post-transfusion hgb improved to 7.7. Hgb improved to 8.1 this morning.  Given the difficult of receiving octreotide therapy as outpatient, patient is at risk for recurrent bleeding upon discharge. Will refer to hematology as outpatient as patient may require IV iron if continuing to have slow bleeds.   - appreciate GI recommendations - Subcutaneous octreotide 300 mg TID per GI - continue to hold DAPT - IV protonix 40 mg BID   #AKI on CKD3a Cr today relatively unchanged at 1.34. BP at lower limit of normal. Suspect AKI likely prerenal in the setting of decreased PO intake and hypovolemia. - Encourage PO intake - Trend BMP   #Urinary retention #BPH Had foley placed during last hospitalization. Uncertain if patient f/u w/ urology as outpatient as he presented w/ foley. Had unsuccessful voiding trial on 4/19. Will continue foley catheter and f/u w/ urology as outpatient.   - Tamsulosin 0.4 mg daily  #Back pain Has mild bilateral lumbar paraspinal tenderness on exam. Patient has been declining to work with PT/OT.  This is day 5 of hospitalization.  Suspect his lower back pain may be secondary to prolonged bedrest.  Will try to get him up in his chair today and attempt to work with PT/OT. -Appreciate PT/OT assistance  #Mouth pain Reporting some mild pain today on the roof of his mouth.  No signs of thrush, significant erythema, or bleeding on exam.  Will try numbing spray to help with discomfort. -chloraseptic spray PRN  Diet: regular Bowel: none VTE: SCDs (since 4/18) IVF: none Code: full PT/OT recs: none LOS: day 5  Prior to Admission Living Arrangement: home  Anticipated Discharge Location: home Barriers to Discharge: medical stability Dispo: Anticipated discharge in approximately less than 2 day(s).   Karoline Caldwell, MD 04/12/2023, 6:30 AM Pager: [phone] After 5pm on weekdays and 1pm on weekends: On Call pager [phone]

## 2023-04-12 NOTE — Care Management Important Message (Signed)
Important Message  Patient Details  Name: Russell [name] MRN: [id number] Date of Birth: 1941-04-15   Medicare Important Message Given:  Yes     Sherilyn Banker 04/12/2023, 1:11 PM

## 2023-04-13 ENCOUNTER — Encounter (HOSPITAL_COMMUNITY): Payer: Self-pay | Admitting: Gastroenterology

## 2023-04-13 ENCOUNTER — Other Ambulatory Visit (HOSPITAL_COMMUNITY): Payer: Self-pay

## 2023-04-13 ENCOUNTER — Other Ambulatory Visit: Payer: Self-pay

## 2023-04-13 DIAGNOSIS — D62 Acute posthemorrhagic anemia: Secondary | ICD-10-CM | POA: Diagnosis not present

## 2023-04-13 MED ORDER — OCTREOTIDE ACETATE 100 MCG/ML IJ SOLN
100.0000 ug | Freq: Three times a day (TID) | INTRAMUSCULAR | 0 refills | Status: DC
  Filled 2023-04-13: qty 9, 3d supply, fill #0
  Filled 2023-04-13: qty 90, 30d supply, fill #0

## 2023-04-13 MED ORDER — ENSURE ENLIVE PO LIQD
237.0000 mL | Freq: Three times a day (TID) | ORAL | 12 refills | Status: AC
  Filled 2023-04-13: qty 237, 1d supply, fill #0

## 2023-04-13 MED ORDER — FERROUS SULFATE 325 (65 FE) MG PO TABS
325.0000 mg | ORAL_TABLET | Freq: Every day | ORAL | 0 refills | Status: DC
  Filled 2023-04-13: qty 30, 30d supply, fill #0

## 2023-04-13 MED ORDER — OCTREOTIDE ACETATE 100 MCG/ML IJ SOLN
100.0000 ug | Freq: Three times a day (TID) | INTRAMUSCULAR | 0 refills | Status: DC
  Filled 2023-04-13 – 2023-04-16 (×4): qty 90, 30d supply, fill #0

## 2023-04-13 MED ORDER — PANTOPRAZOLE SODIUM 40 MG PO TBEC
40.0000 mg | DELAYED_RELEASE_TABLET | Freq: Two times a day (BID) | ORAL | 0 refills | Status: DC
  Filled 2023-04-13: qty 60, 30d supply, fill #0

## 2023-04-13 NOTE — TOC Initial Note (Addendum)
Transition of Care [facility]) - Initial/Assessment Note    Patient Details  Name: Russell [name] MRN: [id number] Date of Birth: 1941/07/16  Transition of Care [facility]) CM/SW Contact:    Kingsley Plan, RN Phone Number: 04/13/2023, 11:24 AM  Clinical Narrative:                  Confirmed face sheet information with patient. Patient from home with wife.   Patient to discharge today with octreotide injections TID. Patient understands him and wife will be taught how to administer prior to discharge. HHRN will not be able to make daily visits. Patient agreeable to [facility] but does not want HHPT. Secure chatted attending team for order for [facility] and faxe to face.   NCM offered to call wife. Patient stated he will call her.   Provided patient with bus passes  Expected Discharge Plan: Home w Home Health Services Barriers to Discharge: No Barriers Identified   Patient Goals and CMS Choice Patient states their goals for this hospitalization and ongoing recovery are:: to return to home CMS Medicare.gov Compare Post Acute Care list provided to:: Patient Choice offered to / list presented to : Patient [place] ownership interest in [facility].provided to:: Patient    Expected Discharge Plan and Services     Post Acute Care Choice: Home Health Living arrangements for the past 2 months: Single Family Home Expected Discharge Date: 04/13/23               DME Arranged: N/A         HH Arranged: RN HH Agency: [facility] Date HH Agency Contacted: 04/13/23 Time HH Agency Contacted: 1124 Representative spoke with at [facility] Agency: kelly  Prior Living Arrangements/Services Living arrangements for the past 2 months: Single Family Home Lives with:: Spouse Patient language and need for interpreter reviewed:: Yes Do you feel safe going back to the place where you live?: Yes      Need for Family Participation in Patient Care: Yes (Comment) Care giver support system in place?:  Yes (comment)   Criminal Activity/Legal Involvement Pertinent to Current Situation/Hospitalization: No - Comment as needed  Activities of Daily Living Home Assistive Devices/Equipment: Cane (specify quad or straight), Walker (specify type) (straight cane) ADL Screening (condition at time of admission) Patient's cognitive ability adequate to safely complete daily activities?: Yes Is the patient deaf or have difficulty hearing?: No Does the patient have difficulty seeing, even when wearing glasses/contacts?: No Does the patient have difficulty concentrating, remembering, or making decisions?: No Patient able to express need for assistance with ADLs?: Yes Does the patient have difficulty dressing or bathing?: No Independently performs ADLs?: Yes (appropriate for developmental age) Does the patient have difficulty walking or climbing stairs?: Yes Weakness of Legs: Left Weakness of Arms/Hands: None  Permission Sought/Granted   Permission granted to share information with : No              Emotional Assessment Appearance:: Appears stated age Attitude/Demeanor/Rapport: Engaged Affect (typically observed): Accepting Orientation: : Oriented to Self, Oriented to Place, Oriented to  Time, Oriented to Situation Alcohol / Substance Use: Not Applicable Psych Involvement: No (comment)  Admission diagnosis:  AKI (acute kidney injury) [N17.9] Gastrointestinal hemorrhage, unspecified gastrointestinal hemorrhage type [K92.2] Acute on chronic blood loss anemia [D62] Patient Active Problem List   Diagnosis Date Noted   AVM (arteriovenous malformation) of small bowel, acquired 04/09/2023   Acute on chronic blood loss anemia 04/08/2023   Urinary retention 03/07/2023  Protein-calorie malnutrition, severe 03/06/2023   Upper GI bleed 03/03/2023   Gastric AVM 02/04/2023   Gastrointestinal hemorrhage 02/04/2023   Symptomatic anemia 02/03/2023   Gangrene of toe    Heart failure with mid-range  ejection fraction 02/14/2022   Chronic asymptomatic bacteriuria with pyuria 02/14/2022   Atherosclerosis of aorta 02/14/2022   Cerebral atrophy 02/14/2022   Glaucoma 02/14/2022   Syncope and collapse 02/13/2022   Pre-syncope 02/01/2022   Decreased mobility and endurance 11/19/2021   Frequent falls 11/19/2021   Acute on chronic intracranial subdural hematoma 09/15/2021   CKD (chronic kidney disease) stage 3, GFR 30-59 ml/min (HCC) - baseline SCr 2.0 09/15/2021   Cognitive communication deficit 07/09/2021   Intracranial bleed 07/07/2021   Fall at home, initial encounter    Traumatic hemorrhage of cerebrum without loss of consciousness    Primary osteoarthritis of left hand 05/28/2021   Neuroma of third interspace of foot 04/18/2019   Osteoarthritis 05/31/2018   Foot pain, left 05/11/2018   Acute renal failure superimposed on stage 3b chronic kidney disease 06/23/2017   Surgery, elective    Peripheral neuropathy 01/27/2017   Peripheral arterial disease 01/23/2016   DM type 2 (diabetes mellitus, type 2) 02/22/2015   Anemia of renal disease 02/22/2015   Neuroma digital nerve 09/28/2013   Neuroma, 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   Arthralgia 08/22/2013   Mixed hyperlipidemia 08/22/2013   SEIZURE, 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   BENIGN PROSTATIC HYPERTROPHY, WITH URINARY OBSTRUCTION 06/13/2010   DYSPNEA ON EXERTION 05/15/2010   TOBACCO ABUSE 04/03/2010   COPD 04/03/2010   G E R D 04/03/2010   OTHER DYSPHAGIA 04/03/2010   Type 2 diabetes mellitus with hyperglycemia, without long-term current use of insulin 01/14/2010   LUMBAR RADICULOPATHY, LEFT 05/16/2009   COUGH DUE TO ACE INHIBITORS 04/02/2009   DIZZINESS 07/27/2008   HYPERCHOLESTEROLEMIA, MIXED 05/11/2008   CATARACTS, BILATERAL 03/01/2008   MILD SPINAL STENOSIS, CERVICAL 01/12/2008   LEG CRAMPS 01/12/2008   DIVERTICULOSIS, COLON 08/31/2007   SCABIES 08/26/2007   ERECTILE DYSFUNCTION 08/18/2007   Alcohol abuse 08/18/2007   Essential  hypertension 08/17/2007   HEMOCCULT POSITIVE STOOL 07/05/2007   ANEMIA, IRON DEFICIENCY NOS 05/31/2007   WEIGHT LOSS, ABNORMAL 05/10/2006   PCP:  Valerie Roys, FNP Pharmacy:   [facility] [address] [place] - [address] [place] [address] Phone: [phone] Fax: [phone]  [facility] [address] [place] [address] Phone: [phone] Fax: [phone]  [id number] [place], [place] - [address] AT [facility] [address] [place] [address] Phone: [phone] Fax: [phone]  [facility] - [place], [place] - [address] [place] [address] Phone: [phone] Fax: [phone]     Social Determinants of Health (SDOH) Social History: SDOH Screenings   Food Insecurity: Food Insecurity Present (04/08/2023)  Housing: Low Risk  (04/08/2023)  Transportation Needs: No Transportation Needs (04/08/2023)  Utilities: Not At Risk (04/08/2023)  Tobacco Use: High Risk (04/13/2023)   SDOH Interventions: Food Insecurity Interventions: Inpatient TOC, Other (Comment) (Pt declined.)   Readmission Risk Interventions     No data to display

## 2023-04-13 NOTE — Plan of Care (Signed)

## 2023-04-13 NOTE — Discharge Summary (Signed)
Name: Russell [name] MRN: [id number] DOB: 1941-04-16 82 y.o. PCP: Russell Roys, FNP  Date of Admission: 04/08/2023  1:43 PM Date of Discharge: 04/13/2023 Attending Physician: Tyson Alias, *  Discharge Diagnosis: 1. Principal Problem:   Acute on chronic blood loss anemia Active Problems:   Gastric AVM   AVM (arteriovenous malformation) of small bowel, acquired   [id number]   Urinary Retention   BPH  Discharge Medications: Allergies as of 04/13/2023       Reactions   Ultram [tramadol] Other (See Comments)   Confusion         Medication List     STOP taking these medications    aspirin EC 81 MG tablet   esomeprazole 20 MG packet Commonly known as: NEXIUM   ferrous sulfate 325 (65 FE) MG EC tablet Replaced by: FeroSul 325 (65 FE) MG tablet       TAKE these medications    Alphagan P 0.1 % Soln Generic drug: brimonidine Place 1 drop into the right eye 3 (three) times daily.   bimatoprost 0.01 % Soln Commonly known as: LUMIGAN Place 1 drop into both eyes at bedtime.   feeding supplement Liqd Take 237 mLs by mouth 3 (three) times daily between meals.   FeroSul 325 (65 FE) MG tablet Generic drug: ferrous sulfate Take 1 tablet (325 mg total) by mouth daily with breakfast. Replaces: ferrous sulfate 325 (65 FE) MG EC tablet   meclizine 25 MG tablet Commonly known as: ANTIVERT Take 25 mg by mouth 3 (three) times daily as needed for dizziness.   mirtazapine 15 MG tablet Commonly known as: REMERON Take 15 mg by mouth at bedtime.   octreotide 100 MCG/ML Soln injection Commonly known as: SANDOSTATIN Inject 1 mL (100 mcg total) into the skin 3 (three) times daily.   pantoprazole 40 MG tablet Commonly known as: PROTONIX Take 1 tablet (40 mg total) by mouth 2 (two) times daily. What changed: See the new instructions.   pravastatin 80 MG tablet Commonly known as: PRAVACHOL Take 80 mg by mouth daily.   Simbrinza 1-0.2 % Susp Generic  drug: Brinzolamide-Brimonidine Place 1 drop into both eyes 3 (three) times daily.   tamsulosin 0.4 MG Caps capsule Commonly known as: FLOMAX Take 0.4 mg by mouth.        Disposition and follow-up:   Mr.Russell [name] was discharged from [facility] in Good condition.  At the hospital follow up visit please address:  1.    A. Acute on chronic blood loss anemia   - Small bowel enteroscopy/pill endoscopy demonstrated AVMs, some treated w/ APC, discharged on octreotide, to f/u w/ GI as outpatient, received IV iron, referred to hematology for IV iron   B. Urinary retention in the setting of BPH  - Foley replaced on admission, failed voiding trial, discharged w/ foley, to f/u w/ urology as outpatient  2.  Labs / imaging needed at time of follow-up: CBC, BMP  3.  Pending labs/ test needing follow-up: none  Follow-up Appointments: - Please follow up with your primary care provider Russell Roys, FNP within the next 1-2 weeks (please call to schedule an appointment) Phone: [phone] Address: [address] [place], [place] [address]   - Please follow up with [facility] (please call to schedule an appointment) Phone: [phone] Address: [address], [place], [place] [address]   - Please follow up with hematology (you will be called to  schedule an appointment)  Hospital Course by problem list:  [name] is a 82 y/o male with past medical history of T2DM, CKD3A, IDA, prior GI bleed w/ known AVMs of the duodenoum, GERD, PAD, BPH, chronic pain, glaucoma, tobacco use that presents for lightheadedness and was admitted for acute on chronic blood loss anemia.    #Acute on chronic blood loss anemia #Hx of GI bleed w/ known duodenal AVMs #Hx of IDA Presented after recent hospitalization for acute symptomatic blood loss anemia in the setting of upper GI bleed.  Was instructed to hold Plavix at discharge.  On admission,  it appeared that the patient had been taking aspirin and Plavix daily at home.  Presented with dizziness with ambulation.  GI was consulted. S/p small bowel enteroscopy on 4/19 that demonstrated AVMs, treated w/ APC. Capsule pill endoscopy following demonstrated more bleeding AVMs (not able to be reached by scope per GI). CTA abdomen did not localize GI bleed, therefore no IR intervention. S/p 2X doses of IV ferrlecit (250 mg) and 2X units of pRBC. Hemoglobin stabilized, most recently at 8.1. Patient was also started on octreotide. Discharged on octreotide with plan to follow-up with GI as outpatient.  Also referred to hematology given likely need for IV iron as outpatient.  Recommend repeat CBC as outpatient to ensure hemoglobin remained stable.  Recommend patient follow-up with his primary care provider as outpatient to discuss his recent hospitalizations.  #AKI on CKD3A Creatinine elevated above baseline on admission.  Reported decreased PO intake at home.  Creatinine improved following IV fluids.  Suspect worsened renal function was likely prerenal in nature.  Encouraged increased PO intake.  Recommend repeat BMP as outpatient to assess for improvement of his renal function.   #Falls #Physical deconditioning  Reported recent falls at home on admission.  Also had decreased appetite at home.  Was taking mirtazapine 15 mg daily at bedtime.  Did not complain of any pain related to the falls on admission. No signs of acute trauma. Patient did not want to work with PT throughout his hospitalization.  Patient did not want home health PT.  Suspect patient could benefit from home health/outpatient PT if he continues to have falls at home.   #PAD #HLD Was taking pravastatin 80 mg daily, aspirin 81 mg daily, and Plavix 75 mg daily at home.  Held aspirin and Plavix due to concern for GI bleed. Discontinued aspirin and Plavix at discharge to decrease risk of future bleeding. Continued home pravastatin at  discharge.   #Urinary retention #BPH Has history of BPH. Foley was placed at most recent discharge. Medications prior to admission included Tamsulosin 0.4 mg daily and Silodisin 4 mg daily at home. Treated w/ tamsulosin while hospitalized. Was supposed to f/u w/ urology as outpatient but no note on file. Patient reports that he visited urology with instructions to keep his catheter in. Failed voiding trial here on 4/19. Replaced his foley catheter.  No urinary complaints throughout his hospitalization.  UA without signs of infection.  Discharged on tamsulosin.  Will need to follow-up with urology as outpatient to determine length of therapy for his catheter.   #T2DM A1c 5.6% 9 months ago. CBGs remained WNL throughout his hospitalization. Recommend repeat A1c as outpatient to assess need for pharmacotherapy.    #Chronic Pain Has history of chronic pain. Was taking oxycodone-acetaminophen 10-325 mg q8 hours PRN at home. Treated w/ oxycodone and tylenol while inpatient. Continued home medication at discharge.    #Glaucoma Was using Bimatoprost  and Brinzolamide-brimonidine eyedrops eyedrops at home.  No complaints of eye pain throughout his hospitalization. Treated w/ Brimonidine/Latanoprost eye drops while hospitalized. Continued home medications at discharge.   Discharge Exam:   BP 130/86 (BP Location: Left Arm)   Pulse 60   Temp 98 F (36.7 C) (Oral)   Resp 16   Ht  (1.88 m)   Wt 49 kg   SpO2 93%   BMI 13.87 kg/m  General: Chronically ill-appearing, laying in bed, not in acute distress. CV: RRR. No murmurs. No LE edema Pulmonary: Lungs clear to auscultation anteriorly. Normal work of breathing. Abdominal: Soft, nontender, nondistended.  Neuro: A x O x3. Moves all extremities.   Pertinent Labs, Studies, and Procedures:     Latest Ref Rng & Units 04/12/2023    6:42 AM 04/11/2023    8:27 PM 04/11/2023    3:55 AM  CBC  WBC 4.0 - 10.5 K/uL 10.3   8.4   Hemoglobin 13.0 - 17.0 g/dL  8.1  7.7  6.3   Hematocrit 39.0 - 52.0 % 26.9  24.5  20.0   Platelets 150 - 400 K/uL 282   283        Latest Ref Rng & Units 04/12/2023    6:42 AM 04/11/2023    3:55 AM 04/10/2023    6:57 AM  BMP  Glucose 70 - 99 mg/dL 93  604  85   BUN 8 - 23 mg/dL Creatinine 0.61 - 1.24 mg/dL 5.40  9.81  1.91   Sodium 135 - 145 mmol/L 138  136  134   Potassium 3.5 - 5.1 mmol/L 4.3  4.1  4.1   Chloride 98 - 111 mmol/L 109  105  105   CO2 22 - 32 mmol/L Calcium 8.9 - 10.3 mg/dL 8.5  8.5  8.4      ENTEROSCOPY  - Esophagogastric landmarks identified. - 2 cm hiatal hernia. - Normal esophagus otherwise. 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 Treated with argon plasma coagulation ( APC) . 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 Treated with argon plasma coagulation ( APC) . - Two non- bleeding angiodysplastic lesions in the duodenum. Treated with argon plasma coagulation ( APC) . - A tattoo was seen in the jejunum. - Normal exam otherwise.  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 Patient had drank prep, ready for capsule endoscopy to clear his more distal small bowel beyond tattoo to assess AVM burden and risk for recurrent anemia.  CT ANGIO GI BLEED  IMPRESSION: VASCULAR   1. No findings of intraluminal contrast extravasation to localize GI bleeding. 2. Advanced aortoiliac atherosclerotic disease.   NON-VASCULAR   1. No acute findings in the abdomen or pelvis. 2. Sigmoid diverticulosis without active inflammatory changes.   On: 04/11/2023 13:46  Discharge Instructions: Discharge Instructions     Ambulatory referral to Hematology / Oncology   Complete by: As directed    Call MD for:  difficulty breathing, headache or visual disturbances   Complete by: As directed    Call MD for:  extreme fatigue   Complete by: As directed    Call MD for:  persistant  dizziness or light-headedness   Complete by: As directed    Call MD for:  severe uncontrolled pain   Complete by: As directed  Diet - low sodium heart healthy   Complete by: As directed    Face-to-face encounter (required for Medicare/Medicaid patients)   Complete by: As directed    I Ilhan Debenedetto certify that this patient is under my care and that I, or a nurse practitioner or physician's assistant working with me, had a face-to-face encounter that meets the physician face-to-face encounter requirements with this patient on 04/13/2023. The encounter with the patient was in whole, or in part for the following medical condition(s) which is the primary reason for home health care (List medical condition): octreotide injections (will need teaching from RN).   The encounter with the patient was in whole, or in part, for the following medical condition, which is the primary reason for home health care: octreotide injections   I certify that, based on my findings, the following services are medically necessary home health services: Nursing   Reason for Medically Necessary Home Health Services: Skilled Nursing- Changes in Medication/Medication Management   My clinical findings support the need for the above services: OTHER SEE COMMENTS   Further, I certify that my clinical findings support that this patient is homebound due to: Unsafe ambulation due to balance issues   Home Health   Complete by: As directed    To provide the following care/treatments: RN   Increase activity slowly   Complete by: As directed       You were hospitalized for anemia due to blood loss.    Hospital Course: We found areas of bleeding in your GI tract that we suspect were the cause of your anemia and dizziness.  We started you on octreotide injections at home to decrease the risk of further bleeding. A home health RN will come by your house to help you learn how to do these injections. We gave you IV iron while you are  hospitalized.  We suspect that she will continue to need IV iron after discharge from the hospital, therefore we have referred you to a hematologist (blood doctor).  Please follow-up with them as stated below.  Please also follow-up with gastroenterology (stomach doctor) regarding the bleeding in your GI tract.  Please follow-up with your primary care doctor within 1-2 weeks to discuss this hospitalization and changes to your medications.   Medications:   Please start taking: -Ensure feeding supplement (237 mL 3 times daily) -Octreotide injection (100 mcg 3 times daily)   Please stop taking: -Aspirin 81 mg daily -Plavix 75 mg daily  -Esomeprazole 20 mg daily -Silodosin 4 mg daily   Please change how you are taking: -Pantoprazole 40 mg twice daily -Ferrous sulfate 325 mg daily   Please continue taking: -Alphagan eyedrops -Bimatoprost eyedrops -Brinzolamide-brimonidine eyedrops -Esomeprazole 20 mg daily -Meclizine 25 mg 3 times daily as needed -Mirtazapine 15 mg daily -Pravastatin 80 mg daily -Tamsulosin 0.4 mg daily   Follow-up: - Please follow up with your primary care provider Russell Roys, FNP within the next 1-2 weeks (please call to schedule an appointment) Phone: [phone] Address: [address], [place], [place] [address]   - Please follow up with [facility] Gastroenterology (please call to schedule an appointment) Phone: [phone] Address: [address], [place], [place] [address]   - Please follow up with hematology (you will be called to schedule an appointment)  Signed: Karoline Caldwell, MD 04/13/2023, 1:07 PM   Pager: [phone]

## 2023-04-13 NOTE — Discharge Instructions (Addendum)
You were hospitalized for anemia due to blood loss.   Hospital Course: We found areas of bleeding in your GI tract that we suspect were the cause of your anemia and dizziness.  We started you on octreotide injections at home to decrease the risk of further bleeding. A home health RN will come by your house to help you learn how to do these injections. We gave you IV iron while you are hospitalized.  We suspect that she will continue to need IV iron after discharge from the hospital, therefore we have referred you to a hematologist (blood doctor).  Please follow-up with them as stated below.  Please also follow-up with gastroenterology (stomach doctor) regarding the bleeding in your GI tract.  Please follow-up with your primary care doctor within 1-2 weeks to discuss this hospitalization and changes to your medications.  Medications:  Please start taking: -Ensure feeding supplement (237 mL 3 times daily) -Octreotide injection (100 mcg 3 times daily)  Please stop taking: -Aspirin 81 mg daily -Plavix 75 mg daily  -Esomeprazole 20 mg daily -Silodosin 4 mg daily  Please change how you are taking: -Pantoprazole 40 mg twice daily -Ferrous sulfate 325 mg daily  Please continue taking: -Alphagan eyedrops -Bimatoprost eyedrops -Brinzolamide-brimonidine eyedrops -Esomeprazole 20 mg daily -Meclizine 25 mg 3 times daily as needed -Mirtazapine 15 mg daily -Pravastatin 80 mg daily -Tamsulosin 0.4 mg daily  Follow-up: - Please follow up with your primary care provider Valerie Roys, FNP within the next 1-2 weeks (please call to schedule an appointment) Phone: [phone] Address: [address], [place], [place] [address]  - Please follow up with [facility] Gastroenterology (please call to schedule an appointment) Phone: [phone] Address: [address], [place], [place] [address]  - Please follow up with hematology (you will be called to schedule an appointment)

## 2023-04-13 NOTE — Progress Notes (Signed)
Octreotide arrived from [facility] pharmacy, pt, wife and daughter shown how to draw up the medication, and inject the medication.  Pt has been irritable since and before this nurse arrived to the unit, waiting for the medication impatiently.  Pt states he knows how to give the medication and wants to go home.  Pt may not be compliant with medication or let his wife or daughter administer it.   Supplies provided for administration of medication.   Pt d/c'd via wheelchair with belongings, home meds and TOC meds with wife and daughter.

## 2023-04-13 NOTE — Progress Notes (Signed)
Discharge instructions reviewed with pt, wife and daughter.  Copy of instructions given to pt. [facility] pharmacy is filling pt's scripts, 2 scripts are ready--iron and protonix, pharmacy still working on Octreotide sq being filled, they have had to talk with the ordering MD, wife and pt.  Medication having to be brought up from main pharmacy to [facility] pharmacy then processed.  Nursing staff will have to educate pt and his wife possible his daughter on how to administer the medication subcutaneously .  Waiting for medication to be delivered to pt's room.

## 2023-04-14 ENCOUNTER — Other Ambulatory Visit (HOSPITAL_COMMUNITY): Payer: Self-pay

## 2023-04-14 NOTE — Progress Notes (Signed)
Agree with the assessment and plan as outlined by Willette Cluster, NP.  Patient was admitted again for symptomatic anemia from bleeding from GI AVMs.  He was started on octreotide at discharge.  Hopefully will be able to get this approved for continued therapy as outpatient  Catrena Vari E. Tomasa Rand, MD [facility] Gastroenterology

## 2023-04-15 ENCOUNTER — Other Ambulatory Visit: Payer: Self-pay

## 2023-04-15 ENCOUNTER — Other Ambulatory Visit (HOSPITAL_COMMUNITY): Payer: Self-pay

## 2023-04-16 ENCOUNTER — Other Ambulatory Visit: Payer: Self-pay

## 2023-04-16 ENCOUNTER — Other Ambulatory Visit (HOSPITAL_COMMUNITY): Payer: Self-pay

## 2023-04-19 ENCOUNTER — Other Ambulatory Visit (HOSPITAL_COMMUNITY): Payer: Self-pay

## 2023-04-20 ENCOUNTER — Other Ambulatory Visit: Payer: Self-pay

## 2023-04-20 ENCOUNTER — Other Ambulatory Visit (HOSPITAL_COMMUNITY): Payer: Self-pay

## 2023-04-21 ENCOUNTER — Other Ambulatory Visit: Payer: Self-pay | Admitting: Podiatry

## 2023-04-21 ENCOUNTER — Other Ambulatory Visit (HOSPITAL_COMMUNITY): Payer: Self-pay

## 2023-04-21 ENCOUNTER — Ambulatory Visit (INDEPENDENT_AMBULATORY_CARE_PROVIDER_SITE_OTHER): Payer: 59 | Admitting: Podiatry

## 2023-04-21 ENCOUNTER — Other Ambulatory Visit: Payer: Self-pay

## 2023-04-21 ENCOUNTER — Encounter: Payer: Self-pay | Admitting: Podiatry

## 2023-04-21 DIAGNOSIS — G5782 Other specified mononeuropathies of left lower limb: Secondary | ICD-10-CM | POA: Diagnosis not present

## 2023-04-21 MED ORDER — OXYCODONE-ACETAMINOPHEN 10-325 MG PO TABS: 1.0000 | ORAL_TABLET | Freq: Three times a day (TID) | ORAL | 0 refills | Status: AC | PRN

## 2023-04-21 NOTE — Progress Notes (Signed)
He presents today for follow-up of his neuroma third interspace of the left foot.  Objective: Pain on palpation third interspace of the left foot.  Pulses are nonpalpable left.  Assessment: Chronic neuroma third interspace left foot.  Plan: Refilled his Percocet.  Also reinjected his left third interdigital space with 10 mg of Kenalog.

## 2023-04-26 ENCOUNTER — Telehealth: Payer: Self-pay | Admitting: Nurse Practitioner

## 2023-04-26 NOTE — Telephone Encounter (Signed)
Inbound call from Lovenia Kim, RN, from [facility] about whether he is supposed to be on plavix. She stated patient is not supposed to be taking Plavix only aspirin.   325 mg daily aspirin, no Plavix,   Stated to call back if you had any further questions.   Human resources officer [phone].

## 2023-04-26 NOTE — Telephone Encounter (Signed)
Unable to reach or leave message on patient's numbers. Will try again later today.

## 2023-04-27 NOTE — Telephone Encounter (Signed)
Left message for patient to call back  

## 2023-04-29 NOTE — Telephone Encounter (Signed)
Attempted to reach patient at all numbers listed. No voicemail x 2 numbers and another number with no answer.

## 2023-05-11 ENCOUNTER — Other Ambulatory Visit (HOSPITAL_COMMUNITY): Payer: Self-pay

## 2023-05-11 ENCOUNTER — Other Ambulatory Visit: Payer: Self-pay

## 2023-05-20 ENCOUNTER — Other Ambulatory Visit: Payer: Self-pay | Admitting: Podiatry

## 2023-05-20 ENCOUNTER — Ambulatory Visit: Payer: 59 | Admitting: Podiatry

## 2023-05-20 MED ORDER — OXYCODONE-ACETAMINOPHEN 10-325 MG PO TABS: 1.0000 | ORAL_TABLET | Freq: Three times a day (TID) | ORAL | 0 refills | Status: AC | PRN

## 2023-05-21 ENCOUNTER — Other Ambulatory Visit (HOSPITAL_COMMUNITY): Payer: Self-pay

## 2023-06-10 ENCOUNTER — Ambulatory Visit (INDEPENDENT_AMBULATORY_CARE_PROVIDER_SITE_OTHER): Payer: 59 | Admitting: Podiatry

## 2023-06-10 ENCOUNTER — Encounter: Payer: Self-pay | Admitting: Podiatry

## 2023-06-10 DIAGNOSIS — G5782 Other specified mononeuropathies of left lower limb: Secondary | ICD-10-CM

## 2023-06-10 MED ORDER — OXYCODONE-ACETAMINOPHEN 10-325 MG PO TABS: 1.0000 | ORAL_TABLET | Freq: Three times a day (TID) | ORAL | 0 refills | Status: AC | PRN

## 2023-06-10 NOTE — Progress Notes (Signed)
He presents today for follow-up of his neuroma third interspace left foot.  States this hurt me again.  Objective: Vital signs stable alert oriented x 3.  Pulses are nonpalpable.  Pain on palpation to the second third interdigital space.  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  Peripheral vascular disease.  Plan: Injected dehydrated alcohol refill his pain medication.

## 2023-07-15 ENCOUNTER — Ambulatory Visit (INDEPENDENT_AMBULATORY_CARE_PROVIDER_SITE_OTHER): Payer: 59 | Admitting: Podiatry

## 2023-07-15 DIAGNOSIS — G5782 Other specified mononeuropathies of left lower limb: Secondary | ICD-10-CM

## 2023-07-15 MED ORDER — OXYCODONE-ACETAMINOPHEN 10-325 MG PO TABS: 1.0000 | ORAL_TABLET | ORAL | 0 refills | Status: DC | PRN

## 2023-07-15 NOTE — Progress Notes (Signed)
Chief Complaint  Patient presents with   Neuroma    Left foot neuroma follow-up, patient is doing the same this visit, rate of pain 8 out of 10, TX: injection     HPI: 82 y.o. male presenting today with c/o pain, burning/tingling, noted near the left third interspace.  Patient describes pain on the ball of the foot when standing.  Feels like "something" is on the bottom of the foot when walking, but nothing is actually there.  Denies injury.  States the last cortisone injection "did not do any good".  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   COPD (chronic obstructive pulmonary disease) (HCC)    Coronary atherosclerosis    denied knowledge of this 10/27/16   Depression    Diverticulosis of colon    DYSPHAGIA UNSPECIFIED 05/16/2009   Qualifier: Diagnosis of  By: Delrae Alfred MD, Elizabeth     Dyspnea    EARLY SATIETY 11/04/2007   Qualifier: Diagnosis of  By: Delrae Alfred MD, Lanora Manis     ED (erectile dysfunction)    Full dentures    GERD (gastroesophageal reflux disease)    not current   Glaucoma    History of gastritis    History of scabies    2008   History of seizure    2013-- x2   idiopathic --  none since   History of syncope    03-22-2014  DX  VAGAL RESPONSE   Hyperlipidemia, mixed    Hypertension    Mitral regurgitation    Peripheral arterial disease (HCC)    one vessel runoff bilaterally via peroneal arteries   Prostate cancer (HCC)    Seizures (HCC) 03/2014   ? or  syncope- patient refused to be admitted for further studies- "felt better"   Type 2 diabetes, diet controlled (HCC)    patient and his wife said no- have not been told 01/21/17   Wears glasses     Past Surgical History:  Procedure Laterality Date   ABDOMINAL AORTOGRAM W/LOWER EXTREMITY Left 12/18/2020   Procedure: ABDOMINAL AORTOGRAM W/LOWER EXTREMITY;  Surgeon: Leonie Douglas, MD;  [facility]: [facility];  Service: Cardiovascular;  Laterality: Left;   ABDOMINAL AORTOGRAM W/LOWER EXTREMITY N/A 02/26/2021   Procedure: ABDOMINAL AORTOGRAM W/LOWER EXTREMITY;  Surgeon: Leonie Douglas, MD;  Location: [facility];  Service: Cardiovascular;  Laterality: N/A;   ANTERIOR CERVICAL DECOMP/DISCECTOMY FUSION  10-16-2009   C4 -- C6   ANTERIOR CERVICAL DECOMP/DISCECTOMY FUSION N/A 01/27/2017   Procedure: ANTERIOR CERVICAL DECOMPRESSION FUSION CERVICAL 3-4 WITH INSTRUMENTATION AND ALLOGRAFT;  Surgeon: Estill Bamberg, MD;  Location: [facility];  Service: Orthopedics;  Laterality: N/A;  ANTERIOR CERVICAL DECOMPRESSION FUSION CERVICAL 3-4 WITH INSTRUMENTATION AND ALLOGRAFT   CAPSULOTOMY Right 01/26/2013   Procedure: MINOR CAPSULOTOMY;  Surgeon: Vita Erm., MD;  Location: [facility] OR;  Service: Ophthalmology;  Laterality: Right;   CARPAL TUNNEL RELEASE Left 01/02/2019   Procedure: LEFT CARPAL TUNNEL RELEASE;  Surgeon: Betha Loa,  MD;  Location: [facility];  Service: Orthopedics;  Laterality: Left;   CARPECTOMY Right 03/13/2014   Procedure: RIGHT  PROXIMAL ROW CARPECTOMY;  Surgeon: Tami Ribas, MD;  Location: [facility];  Service: Orthopedics;  Laterality: Right;   CARPECTOMY Left 10/22/2015   Procedure: LEFT WRIST PROXIMAL ROW CARPECTOMY ;  Surgeon: Betha Loa, MD;  Location: [facility];  Service: Orthopedics;  Laterality: Left;   CARPOMETACARPAL (CMC) FUSION OF THUMB Right 03/13/2014   Procedure: RIGHT FUSION OF THUMB CARPOMETACARPAL [facility]) JOINT;   Surgeon: Tami Ribas, MD;  Location: [facility];  Service: Orthopedics;  Laterality: Right;   CARPOMETACARPEL SUSPENSION PLASTY Left 04/04/2020   Procedure: LEFT THUMB TRAPECIECTOMY AND SUSPENSIONPLASTY;  Surgeon: Betha Loa, MD;  Location: [facility];  Service: Orthopedics;  Laterality: Left;   CATARACT EXTRACTION W/ INTRAOCULAR LENS  IMPLANT, BILATERAL  2009   COLONOSCOPY  08-31-2007   COLONOSCOPY     COLONOSCOPY N/A 03/06/2023   Procedure: COLONOSCOPY;  Surgeon: Mansouraty, Netty Starring., MD;  Location: [facility] ENDOSCOPY;  Service: Gastroenterology;  Laterality: N/A;   CRYOABLATION N/A 01/14/2015   Procedure: CRYO ABLATION PROSTATE;  Surgeon: Kathi Ludwig, MD;  Location: [facility];  Service: Urology;  Laterality: N/A;   ENTEROSCOPY N/A 03/06/2023   Procedure: ENTEROSCOPY;  Surgeon: Meridee Score Netty Starring., MD;  Location: [facility]OSCOPY;  Service: Gastroenterology;  Laterality: N/A;   ENTEROSCOPY N/A 04/09/2023   Procedure: ENTEROSCOPY;  Surgeon: Benancio Deeds, MD;  Location: [facility] ENDOSCOPY;  Service: Gastroenterology;  Laterality: N/A;   ESOPHAGOGASTRODUODENOSCOPY  last one 09-11-2008   ESOPHAGOGASTRODUODENOSCOPY (EGD) WITH PROPOFOL N/A 02/04/2023   Procedure: ESOPHAGOGASTRODUODENOSCOPY (EGD) WITH PROPOFOL;  Surgeon: Jenel Lucks, MD;  Location: [facility]COPY;  Service: Gastroenterology;  Laterality: N/A;   EXCISION MASS LEFT FACE , SUBORBITAL AREA, PLASTER RECONSTRUCTION  02-09-2011   EXCISIONAL DEBRIDEMENT AND REPAIR RIGHT QUADRICEP TENDON  07-05-2000   FOOT SURGERY Left    GIVENS CAPSULE STUDY N/A 04/09/2023   Procedure: GIVENS CAPSULE STUDY;  Surgeon: Benancio Deeds, MD;  Location: [facility] ENDOSCOPY;  Service: Gastroenterology;  Laterality: N/A;   HOT HEMOSTASIS N/A 02/04/2023   Procedure: HOT HEMOSTASIS (ARGON PLASMA COAGULATION/BICAP);  Surgeon: Jenel Lucks, MD;  Location: [facility];  Service: Gastroenterology;   Laterality: N/A;   HOT HEMOSTASIS N/A 03/06/2023   Procedure: HOT HEMOSTASIS (ARGON PLASMA COAGULATION/BICAP);  Surgeon: Lemar Lofty., MD;  Location: [facility] END[facility]COPY;  Service: Gastroenterology;  Laterality: N/A;   HOT HEMOSTASIS N/A 04/09/2023   Procedure: HOT HEMOSTASIS (ARGON PLASMA COAGULATION/BICAP);  Surgeon: Benancio Deeds, MD;  Location: [facility] ENDOSCOPY;  Service: Gastroenterology;  Laterality: N/A;   I & D EXTREMITY Right 05/24/2013   Procedure: RIGHT INDEX WOUND DEBRIDEMENT AND CLOSURE;  Surgeon: Jodi Marble, MD;  Location: [facility];  Service: Orthopedics;  Laterality: Right;   INSERTION OF SUPRAPUBIC CATHETER N/A 01/14/2015   Procedure: SUPRAPUBIC TUBE PLACEMENT;  Surgeon: Kathi Ludwig, MD;  Location: [facility];  Service: Urology;  Laterality: N/A;   KNEE ARTHROSCOPY Right 2008   LARYNGOSCOPY AND ESOPHAGOSCOPY  06-13-2010   MARSUPIALIZATION LEFT LARGE VALLECULAR CYST   MASS EXCISION Left 10/22/2015   Procedure: EXCISION MASS OF LEFT INDEX FINGER;  Surgeon: Betha Loa, MD;  Location: [facility];  Service: Orthopedics;  Laterality: Left;   ORCHIECTOMY Left 02/24/2015   Procedure: SCROTAL EXPLORATION WITH LEFT ORCHIECTOMY;  Surgeon: Ihor Gully, MD;  Location: [facility];  Service: Urology;  Laterality: Left;   PERIPHERAL VASCULAR INTERVENTION Left 02/26/2021   Procedure: PERIPHERAL VASCULAR INTERVENTION;  Surgeon: Leonie Douglas, MD;  [facility];  Service: Cardiovascular;  Laterality: Left;  Superficial femoral   POLYPECTOMY  03/06/2023   Procedure: POLYPECTOMY;  Surgeon: Mansouraty, Netty Starring., MD;  Location: [facility] ENDOSCOPY;  Service: Gastroenterology;;   POSTERIOR CERVICAL FUSION/FORAMINOTOMY N/A 01/28/2017   Procedure: POSTERIOR SPINAL FUSION CERVICAL 3-4, CERVICAL 4-5, CERVICAL 5-6, CERVICAL 6-7 WITH INSTRUMENATION AND ALLOGRAFT;  Surgeon: Estill Bamberg, MD;  Location: [facility];  Service: Orthopedics;   Laterality: N/A;  POSTERIOR SPINAL FUSION CERVICAL 3-4, CERVICAL 4-5, CERVICAL 5-6, CERVICAL 6-7 WITH INSTRUMENATION AND ALLOGRAFT   SHOULDER ARTHROSCOPY/ DEBRIDEMENT LABRAL TEAR/  BICEPS TENOTOMY Left 09-24-2011   SUBMUCOSAL TATTOO INJECTION  03/06/2023   Procedure: SUBMUCOSAL TATTOO INJECTION;  Surgeon: Lemar Lofty., MD;  Location: [facility] ENDOSCOPY;  Service: Gastroenterology;;   TONSILLECTOMY  as child   TRANSTHORACIC ECHOCARDIOGRAM  02-21-2010   normal LVF/  ef 55-60%/ mild to moderate MR/  moderate LAE/  mild TR   YAG LASER APPLICATION Right 01/26/2013   Procedure: YAG LASER APPLICATION;  Surgeon: Vita Erm., MD;  Location: [facility] OR;  Service: Ophthalmology;  Laterality: Right;   YAG LASER CAPSULOTOMY, LEFT EYE  01-08-2011    Allergies  Allergen Reactions   Ultram [Tramadol] Other (See Comments)    Confusion      PHYSICAL EXAM:  General: The patient is alert and oriented x3 in no acute distress.  Dermatology: Skin is warm, dry and supple bilateral lower extremities. Interspaces are clear of maceration and debris.    Vascular: Palpable pedal pulses bilaterally. Capillary refill within normal limits.  No appreciable edema.  No erythema or calor.  Neurological: Light touch sensation grossly intact bilateral feet.   Musculoskeletal Exam: No pedal deformities noted.  No pain on palpation of the metatarsals.  No pain with ROM of the MPJ's.  There is a positive Muldor's sign with compression of the left third interspace.  There is pain with compression of the left third interspace.   ASSESSMENT / PLAN OF CARE: 1. 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    Dispense:  30 tablet    Refill:  0   Discussed patient's condition today and possible etiologies.  Discussed conservative treatment options,  including off-loading, shoe modifications, cortisone injection, NSAID use, and possible surgery if conservative options fail.  With the patient's consent a corticosteroid injection was administered to the left third interspace.  This consisted of a mixture of 1% Xylocaine plain, 0.5% Sensorcaine plain, and Kenalog 10 for total of 1.25 cc administered.  A Band-Aid was applied.  He can remove this and a few hours.  After leaving the exam room the patient came out and found the in the hallway stating "I do not know who you have to talk to but somebody better renew my pain medicine today."  I did not feel comfortable with the patient's threatening tone, but the pain medicine was sent in.  Dr. Al Corpus can speak to him in the future regarding the manner of his medication request.  Return in about 4 weeks (around 08/12/2023) for f/u neuroma w/ Dr. Al Corpus.   Clerance Lav, DPM, FACFAS [facility]  [address] [place], [place] [address]                Office [phone]  Fax [phone]

## 2023-07-18 DIAGNOSIS — G5782 Other specified mononeuropathies of left lower limb: Secondary | ICD-10-CM | POA: Diagnosis not present

## 2023-07-18 MED ORDER — TRIAMCINOLONE ACETONIDE 10 MG/ML IJ SUSP
10.0000 mg | Freq: Once | INTRAMUSCULAR | Status: AC
Start: 2023-07-18 — End: 2023-07-18
  Administered 2023-07-18: 10 mg via INTRAMUSCULAR

## 2023-07-26 ENCOUNTER — Telehealth: Payer: Self-pay | Admitting: Nurse Practitioner

## 2023-07-26 NOTE — Telephone Encounter (Signed)
Spoke with Steward Drone. She will encourage the patient to contact us for follow up of his anemia or to return to his PCP ([facility]) for follow up.

## 2023-07-26 NOTE — Telephone Encounter (Signed)
Patient last seen in office in April. Hospitalized. No follow up appointment with [place].  Called Steward Drone Camter back at [phone]. No answer. Left message and my direct line.

## 2023-07-26 NOTE — Telephone Encounter (Signed)
Inbound call from [facility] from Dr. Merlyn Lot office at the [facility]. Requesting a call back to discuss GI Bleed and Low hemoglobin of patient. States patient has been been denied wrist surgery due to these complications. Requesting a call to gain further information at [phone]. Please advise, thank you.

## 2023-08-02 NOTE — Progress Notes (Unsigned)
08/03/2023 Alda Berthold [id number] 1941/06/15  Referring provider: Valerie Roys,* Primary GI doctor: Dr. 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  Close follow up 3 months  AVM (arteriovenous malformation) of small bowel, acquired with hemorrhage  Other constipation - Increase fiber/ water intake, decrease caffeine, increase activity level. -Will add on Miralax daily  Carpal tunnel Needs medical clearance for hand surgery with [facility], [phone] Steward Drone and [phone].   Patient Care Team: Valerie Roys, FNP as PCP - General (Nurse Practitioner) Glendale Chard, DO as Consulting Physician (Neurology)  HISTORY OF PRESENT ILLNESS: 82 y.o. 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 and others listed below presents for evaluation of anemia.   03/04/2023 ER visit with melena, syncope hemoglobin 4.5, had admission month prior with endoscopy which found AVM status post APC. CT angio negative, did show descending sigmoid  diverticulosis.  04/12/2023  HGB 8.1 MCV 82.8 Platelets 282 01/22/2023 Iron 20 Ferritin 6 B12 1,304 03/10/2023 HGB 9, MCV 85, iron 38, saturation 10  He states he was on iron at one time but thinks he has run out of it, had constipation with it. 'He did have 2 doses of IV ferriecit in the hospital.  He is on protonix 40 mg twice a day.  He was sent in octreotride from the hospital but never got a refill of the medication, was on it 3 x a day.  He is suppose to have left hand surgery for carpal tunnel but can not have it until he has clearance from [place].  He denies melena or hematochezia.  No AB pain.  Denies anemia symptoms such as dyspnea, chest pain, weakness, dizziness, RLS.   Colonoscopy 08/2007: -Unremarkable. EGD 02/04/2023: - The examined portions of the nasopharynx, oropharynx and larynx were normal. - Normal esophagus. - Two non-bleeding angiodysplastic lesions in the stomach. Treated with argon plasma coagulation (APC). - Small hiatal hernia. - Hematin (altered blood/coffee-ground-like material) in the gastric body. - A single non-bleeding angiodysplastic lesion in the duodenum. Treated with argon plasma coagulation (APC). - No specimens collected. 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 It is possible that there may be more AVMs in the small intestine or colon.  Colonoscopy 03/06/2023: Hemorrhoids, stool throughout the exam fair prep even with copious lavage, diverticulosis, 2 polyps 3 to 14 mm in size nonbleeding nonthrombosed external/internal hemorrhoids repeat colonoscopy 1 year for screening purposes written down but with age, uncertain this is needed.  Small bowel endoscopy 04/09/23: Esophagogastric landmarks identified. - 2 cm hiatal hernia. - Normal esophagus otherwise. - Four  angiodysplastic lesions in the stomach - one with oozing and heme in the stomach. Treated with argon plasma coagulation (APC). - Three non-bleeding angiodysplastic lesions  in the duodenum. Treated with argon plasma coagulation (APC). - Two non-bleeding angiodysplastic lesions in the duodenum. Treated with argon plasma coagulation (APC). - A tattoo was seen in the jejunum. - Normal exam otherwise. Capsule study 04/08/2023 50 minutes after capsule injured small bowel bleeding AVM identified, distally further AVMs with some evidence of bleeding.  Distal small bowel significant blood, more proximal portions of small bowel.  No bleeding sites were found in distal small bowel.  Current AVMs felt too distal for push enteroscopy.  Hematology referral, iron supplementation, follow hemoglobin transfuse as needed, consider outpatient octreotide subcu.   He denies blood thinner use.  He denies NSAID use.  He denies ETOH use.   He reports tobacco use, 60 pack year smoking history.  He denies drug use.    He  reports that he has been smoking cigarettes. He has a 59 pack-year smoking history. He has never used smokeless tobacco. He reports that he does not drink alcohol and does not use drugs.  RELEVANT LABS AND IMAGING: CBC    Component Value Date/Time   WBC 10.3 04/12/2023 0642   RBC 3.25 (L) 04/12/2023 0642   HGB 8.1 (L) 04/12/2023 0642   HCT 26.9 (L) 04/12/2023 0642   PLT 282 04/12/2023 0642   MCV 82.8 04/12/2023 0642   MCH 24.9 (L) 04/12/2023 0642   MCHC 30.1 04/12/2023 0642   RDW 20.3 (H) 04/12/2023 0642   LYMPHSABS 1.0 03/03/2023 1238   MONOABS 0.4 03/03/2023 1238   EOSABS 0.0 03/03/2023 1238   BASOSABS 0.0 03/03/2023 1238   Recent Labs    03/04/23 0900 03/05/23 1047 03/06/23 0229 03/07/23 0315 04/08/23 1400 04/09/23 0735 04/10/23 0657 04/11/23 0355 04/11/23 2027 04/12/23 0642  HGB 8.5* 8.0* 8.1* 7.9* 6.2* 7.2* 7.6* 6.3* 7.7* 8.1*    CMP     Component Value Date/Time   NA 138 04/12/2023 0642   K 4.3 04/12/2023 0642   CL 109 04/12/2023 0642   CO2 20 (L) 04/12/2023 0642   GLUCOSE 93 04/12/2023 0642   BUN 17 04/12/2023 0642   CREATININE 1.34  (H) 04/12/2023 0642   CALCIUM 8.5 (L) 04/12/2023 0642   PROT 7.1 04/08/2023 1400   ALBUMIN 4.2 04/08/2023 1400   AST 14 (L) 04/08/2023 1400   ALT 16 04/08/2023 1400   ALKPHOS 40 04/08/2023 1400   BILITOT 0.1 (L) 04/08/2023 1400   GFRNONAA 53 (L) 04/12/2023 0642   GFRAA 51 (L) 04/01/2020 1302      Latest Ref Rng & Units 04/08/2023    2:00 PM 03/04/2023    9:00 AM 03/03/2023   12:38 PM  Hepatic Function  Total Protein 6.5 - 8.1 g/dL 7.1  5.7  6.2   Albumin 3.5 - 5.0 g/dL 4.2  3.1  3.4   AST 15 - 41 U/L 14  13  14    ALT 0 - 44 U/L 16  12  13    Alk Phosphatase 38 - 126 U/L 40  35  37   Total Bilirubin 0.3 - 1.2 mg/dL 0.1  0.7  0.2       Current Medications:   Current Outpatient Medications (Endocrine & Metabolic):    octreotide (SANDOSTATIN) 100 MCG/ML SOLN injection, Inject 1 mL (100 mcg total) into the skin 3 (three) times daily.  Current Outpatient Medications (Cardiovascular):    pravastatin (PRAVACHOL) 80  MG tablet, Take 80 mg by mouth daily.  (Patient not taking: Reported on 08/03/2023)   Current Outpatient Medications (Analgesics):    aspirin EC 81 MG tablet, Take 81 mg by mouth daily. Swallow whole.   oxyCODONE-acetaminophen (PERCOCET) 10-325 MG tablet, Take 1 tablet by mouth every 4 (four) hours as needed for pain. (Patient not taking: Reported on 08/03/2023)  Current Outpatient Medications (Hematological):    ferrous sulfate 325 (65 FE) MG tablet, Take 1 tablet (325 mg total) by mouth daily with breakfast.  Current Outpatient Medications (Other):    Brinzolamide-Brimonidine (SIMBRINZA) 1-0.2 % SUSP, Place 1 drop into both eyes 3 (three) times daily.   feeding supplement (ENSURE ENLIVE / ENSURE PLUS) LIQD, Take 237 mLs by mouth 3 (three) times daily between meals.   Fish Oil-Cholecalciferol (FISH OIL + D3 PO), Take by mouth.   meclizine (ANTIVERT) 25 MG tablet, Take 25 mg by mouth 3 (three) times daily as needed for dizziness.   polyethylene glycol (MIRALAX / GLYCOLAX)  17 g packet, Take 17 g by mouth 2 (two) times daily.   silodosin (RAPAFLO) 4 MG CAPS capsule, Take 4 mg by mouth daily with breakfast.   ALPHAGAN P 0.1 % SOLN, Place 1 drop into the right eye 3 (three) times daily. (Patient not taking: Reported on 08/03/2023)   bimatoprost (LUMIGAN) 0.01 % SOLN, Place 1 drop into both eyes at bedtime. (Patient not taking: Reported on 08/03/2023)   mirtazapine (REMERON) 15 MG tablet, Take 15 mg by mouth at bedtime. (Patient not taking: Reported on 08/03/2023)   oxybutynin (DITROPAN-XL) 10 MG 24 hr tablet, Take 1 tablet by mouth daily. (Patient not taking: Reported on 08/03/2023)   pantoprazole (PROTONIX) 40 MG tablet, Take 1 tablet (40 mg total) by mouth 2 (two) times daily.   tamsulosin (FLOMAX) 0.4 MG CAPS capsule, Take 0.4 mg by mouth. 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   COPD (chronic obstructive pulmonary disease) (HCC)    Coronary atherosclerosis    denied knowledge of this 10/27/16   Depression    Diverticulosis of colon    DYSPHAGIA UNSPECIFIED 05/16/2009   Qualifier: Diagnosis of  By: Delrae Alfred MD, Elizabeth     Dyspnea    EARLY SATIETY 11/04/2007   Qualifier: Diagnosis of  By: Delrae Alfred MD, Lanora Manis     ED (erectile dysfunction)    Full dentures    GERD (gastroesophageal reflux disease)    not current   Glaucoma    History of gastritis    History of scabies    2008   History of  seizure    2013-- x2   idiopathic --  none since   History of syncope    03-22-2014  DX  VAGAL RESPONSE   Hyperlipidemia, mixed    Hypertension    Mitral regurgitation    Peripheral arterial disease (HCC)    one vessel runoff bilaterally via peroneal arteries   Prostate cancer (  HCC)    Seizures (HCC) 03/2014   ? or syncope- patient refused to be admitted for further studies- "felt better"   Type 2 diabetes, diet controlled (HCC)    patient and his wife said no- have not been told 01/21/17   Wears glasses    Allergies:  Allergies  Allergen Reactions   Ultram [Tramadol] Other (See Comments)    Confusion      Surgical History:  He  has a past surgical history that includes Knee arthroscopy (Right, 2008); Capsulotomy (Right, 01/26/2013); Yag laser application (Right, 01/26/2013); I & D extremity (Right, 05/24/2013); Foot surgery (Left); Carpectomy (Right, 03/13/2014); Carpometacarpal (cmc) fusion of thumb (Right, 03/13/2014); Cataract extraction w/ intraocular lens  implant, bilateral (2009); YAG LASER CAPSULOTOMY, LEFT EYE (01-08-2011); Anterior cervical decomp/discectomy fusion (10-16-2009); EXCISIONAL DEBRIDEMENT AND REPAIR RIGHT QUADRICEP TENDON (07-05-2000); Laryngoscopy and esophagoscopy (06-13-2010); EXCISION MASS LEFT FACE , SUBORBITAL AREA, PLASTER RECONSTRUCTION (02-09-2011); SHOULDER ARTHROSCOPY/ DEBRIDEMENT LABRAL TEAR/  BICEPS TENOTOMY (Left, 09-24-2011); Colonoscopy (08-31-2007); Esophagogastroduodenoscopy (last one 09-11-2008); transthoracic echocardiogram (02-21-2010); Tonsillectomy (as child); Cryoablation (N/A, 01/14/2015); Insertion of suprapubic catheter (N/A, 01/14/2015); Orchiectomy (Left, 02/24/2015); Carpectomy (Left, 10/22/2015); Mass excision (Left, 10/22/2015); Colonoscopy; Anterior cervical decomp/discectomy fusion (N/A, 01/27/2017); Posterior cervical fusion/foraminotomy (N/A, 01/28/2017); Carpal tunnel release (Left, 01/02/2019); Carpometacarpel [facility]) suspension plasty (Left, 04/04/2020);  ABDOMINAL AORTOGRAM W/LOWER EXTREMITY (Left, 12/18/2020); ABDOMINAL AORTOGRAM W/LOWER EXTREMITY (N/A, 02/26/2021); PERIPHERAL VASCULAR INTERVENTION (Left, 02/26/2021); Esophagogastroduodenoscopy (egd) with propofol (N/A, 02/04/2023); Hot hemostasis (N/A, 02/04/2023); enteroscopy (N/A, 03/06/2023); Colonoscopy (N/A, 03/06/2023); Hot hemostasis (N/A, 03/06/2023); Submucosal tattoo injection (03/06/2023); polypectomy (03/06/2023); enteroscopy (N/A, 04/09/2023); Hot hemostasis (N/A, 04/09/2023); and Givens capsule study (N/A, 04/09/2023). Family History:  His family history includes Cancer in his sister and another family member; Diabetes in an other family member; Heart attack in his father; Unexplained death in his mother.  REVIEW OF SYSTEMS  : All other systems reviewed and negative except where noted in the History of Present Illness.  PHYSICAL EXAM: BP 110/60   Pulse 65   Ht 6\' 2"  (1.88 m)   Wt 129 lb (58.5 kg)   BMI 16.56 kg/m  General Appearance: thin, chronically ill appearing AA male, in no apparent distress. Head:   Normocephalic and atraumatic. Eyes:  sclerae anicteric,conjunctive pink  Respiratory: Respiratory effort normal, BS equal bilaterally without rales, rhonchi, wheezing. Cardio: RRR with no MRGs. Peripheral pulses intact.  Abdomen: Soft,  Flat ,active bowel sounds. No tenderness . Without guarding and Without rebound. No masses. Rectal: Not evaluated Musculoskeletal: Full ROM, Normal gait. Without edema. Skin:  Dry and intact without significant lesions or rashes Neuro: Alert and  oriented x4;  No focal deficits. Psych:  Cooperative. Normal mood and affect.    Doree Albee, PA-C 12:01 PM

## 2023-08-03 ENCOUNTER — Encounter: Payer: Self-pay | Admitting: Physician Assistant

## 2023-08-03 ENCOUNTER — Ambulatory Visit (INDEPENDENT_AMBULATORY_CARE_PROVIDER_SITE_OTHER): Payer: 59 | Admitting: Physician Assistant

## 2023-08-03 ENCOUNTER — Other Ambulatory Visit (INDEPENDENT_AMBULATORY_CARE_PROVIDER_SITE_OTHER): Payer: 59

## 2023-08-03 VITALS — BP 110/60 | HR 65 | Ht 74.0 in | Wt 129.0 lb

## 2023-08-03 DIAGNOSIS — K5521 Angiodysplasia of colon with hemorrhage: Secondary | ICD-10-CM

## 2023-08-03 DIAGNOSIS — K5909 Other constipation: Secondary | ICD-10-CM

## 2023-08-03 DIAGNOSIS — D509 Iron deficiency anemia, unspecified: Secondary | ICD-10-CM | POA: Diagnosis not present

## 2023-08-03 LAB — CBC WITH DIFFERENTIAL/PLATELET
Basophils Absolute: 0.1 10*3/uL (ref 0.0–0.1)
Basophils Relative: 1.1 % (ref 0.0–3.0)
Eosinophils Absolute: 0.1 10*3/uL (ref 0.0–0.7)
Eosinophils Relative: 1.5 % (ref 0.0–5.0)
HCT: 35.4 % — ABNORMAL LOW (ref 39.0–52.0)
Hemoglobin: 11 g/dL — ABNORMAL LOW (ref 13.0–17.0)
Lymphocytes Relative: 13.2 % (ref 12.0–46.0)
Lymphs Abs: 1.3 10*3/uL (ref 0.7–4.0)
MCHC: 31.1 g/dL (ref 30.0–36.0)
MCV: 83.6 fl (ref 78.0–100.0)
Monocytes Absolute: 0.7 10*3/uL (ref 0.1–1.0)
Monocytes Relative: 7.5 % (ref 3.0–12.0)
Neutro Abs: 7.3 10*3/uL (ref 1.4–7.7)
Neutrophils Relative %: 76.7 % (ref 43.0–77.0)
Platelets: 333 10*3/uL (ref 150.0–400.0)
RBC: 4.23 Mil/uL (ref 4.22–5.81)
RDW: 17.4 % — ABNORMAL HIGH (ref 11.5–15.5)
WBC: 9.5 10*3/uL (ref 4.0–10.5)

## 2023-08-03 LAB — COMPREHENSIVE METABOLIC PANEL
ALT: 16 U/L (ref 0–53)
AST: 12 U/L (ref 0–37)
Albumin: 4.6 g/dL (ref 3.5–5.2)
Alkaline Phosphatase: 53 U/L (ref 39–117)
BUN: 26 mg/dL — ABNORMAL HIGH (ref 6–23)
CO2: 25 mEq/L (ref 19–32)
Calcium: 10 mg/dL (ref 8.4–10.5)
Chloride: 104 mEq/L (ref 96–112)
Creatinine, Ser: 1.48 mg/dL (ref 0.40–1.50)
GFR: 43.7 mL/min — ABNORMAL LOW (ref >60.00)
Glucose, Bld: 100 mg/dL — ABNORMAL HIGH (ref 70–99)
Potassium: 4.1 mEq/L (ref 3.5–5.1)
Sodium: 140 mEq/L (ref 135–145)
Total Bilirubin: 0.3 mg/dL (ref 0.2–1.2)
Total Protein: 7.5 g/dL (ref 6.0–8.3)

## 2023-08-03 LAB — IBC + FERRITIN
Ferritin: 25.8 ng/mL (ref 22.0–322.0)
Iron: 137 ug/dL (ref 42–165)
Saturation Ratios: 29.8 % (ref 20.0–50.0)
TIBC: 459.2 ug/dL — ABNORMAL HIGH (ref 250.0–450.0)
Transferrin: 328 mg/dL (ref 212.0–360.0)

## 2023-08-03 MED ORDER — OCTREOTIDE ACETATE 100 MCG/ML IJ SOLN: 100.0000 ug | Freq: Three times a day (TID) | INTRAMUSCULAR | 2 refills | Status: AC

## 2023-08-03 MED ORDER — POLYETHYLENE GLYCOL 3350 17 G PO PACK
17.0000 g | PACK | Freq: Two times a day (BID) | ORAL | 3 refills | Status: AC
Start: 2023-08-03 — End: ?

## 2023-08-03 MED ORDER — FERROUS SULFATE 325 (65 FE) MG PO TABS
325.0000 mg | ORAL_TABLET | Freq: Every day | ORAL | 0 refills | Status: DC
Start: 2023-08-03 — End: 2023-09-04

## 2023-08-03 NOTE — Progress Notes (Signed)
Agree with the assessment and plan as outlined by Quentin Mulling,  PA-C.  Given that he has had three admissions this year from bleeding AVMs, I would favor continuing octreotide treatment for now.     E. Tomasa Rand, MD

## 2023-08-03 NOTE — Patient Instructions (Addendum)
Your provider has requested that you go to the basement level for lab work before leaving today. Press "B" on the elevator. The lab is located at the first door on the left as you exit the elevator.  We have scheduled you a follow up on 10/21/2023 AT 9:00AM   325 mg iron once or twice a day, with orange juice or vitamin C to absorb better.  Increase food rich in iron.  Can cause constipation, can add on miralax or the fiber.  If you can not tolerate it, let us know and we can get you an IV iron.    Advised to go to the ER if there is any severe weakness, severe abdominal pain, vomit blood, dark red blood in your bowel movement, shortness of breath or chest pain.   Miralax is an osmotic laxative.  It only brings more water into the stool.  This is safe to take daily.  Can take up to 17 gram of miralax twice a day.  Mix with juice or coffee.  Start 1 capful at night for 3-4 days and reassess your response in 3-4 days.  You can increase and decrease the dose based on your response.  Remember, it can take up to 3-4 days to take effect OR for the effects to wear off.   I often pair this with benefiber in the morning to help assure the stool is not too loose.     _______________________________________________________  If your blood pressure at your visit was 140/90 or greater, please contact your primary care physician to follow up on this.  _______________________________________________________  If you are age 52 or older, your body mass index should be between 23-30. Your Body mass index is 16.56 kg/m. If this is out of the aforementioned range listed, please consider follow up with your Primary Care Provider.  If you are age 57 or younger, your body mass index should be between 19-25. Your Body mass index is 16.56 kg/m. If this is out of the aformentioned range listed, please consider follow up with your Primary Care Provider.    ________________________________________________________  The [facility] providers would like to encourage you to use [facility] to communicate with providers for non-urgent requests or questions.  Due to long hold times on the telephone, sending your provider a message by [facility] may be a faster and more efficient way to get a response.  Please allow 48 business hours for a response.  Please remember that this is for non-urgent requests.  _______________________________________________________ It was a pleasure to see you today!  Thank you for trusting me with your gastrointestinal care!

## 2023-08-03 NOTE — Addendum Note (Signed)
Addended by: Quentin Mulling on: 08/03/2023 02:53 PM   Modules accepted: Orders

## 2023-08-04 ENCOUNTER — Other Ambulatory Visit (HOSPITAL_COMMUNITY): Payer: Self-pay

## 2023-08-04 ENCOUNTER — Telehealth: Payer: Self-pay | Admitting: Physician Assistant

## 2023-08-04 ENCOUNTER — Encounter: Payer: Self-pay | Admitting: Physician Assistant

## 2023-08-04 NOTE — Telephone Encounter (Signed)
[facility] is calling to discuss getting clearance for anemia so the PT can have surgery. He was seen on yesterday here and would like to speak to nurse regarding this information. Please call Sherlyn Lees [phone]

## 2023-08-04 NOTE — Telephone Encounter (Signed)
Pt was seen by Marchelle Folks and there is a letter in epic stating that the pt has been cleared for carpel tunnel surgery. Left message for nurse to call back again.

## 2023-08-04 NOTE — Telephone Encounter (Signed)
Left message for nurse to call back.  

## 2023-08-05 NOTE — Telephone Encounter (Signed)
Spoke with office and clearance letter needs to be faxed to Roselyn Reef NP at [phone]. Letter faxed as requested through epic.

## 2023-08-06 ENCOUNTER — Telehealth: Payer: Self-pay

## 2023-08-06 NOTE — Telephone Encounter (Signed)
Marcelino Duster, RN at [facility] called stating that the pt needed vascular clearance and Plavix hold for an upcoming surgical hardware removal.  Reviewed pt's chart, returned call for clarification, no answer, lf vm.  Marcelino Duster, RN returned call.  Returned call, no answer, lf vm detailing that the pt stopped Plavix when there was a concern for a GI bleed in 07/2022. He was instructed not to restart it and that was again reiterated at his last OV in 09/2022. He should not be taking it at this time. Instructed them to fax a vascular clearance form, if needed, and gave office fax.

## 2023-08-10 ENCOUNTER — Telehealth: Payer: Self-pay

## 2023-08-10 NOTE — Telephone Encounter (Signed)
Returned Michelle's, RN call regarding pt holding ASA for upcoming carpal tunnel surgery. Per MD, pt can hold ASA for however long hand surgeon needs. I left Michelle a VM, as she asked with this information.

## 2023-08-12 ENCOUNTER — Ambulatory Visit: Payer: 59 | Admitting: Podiatry

## 2023-08-12 ENCOUNTER — Telehealth: Payer: Self-pay | Admitting: Podiatry

## 2023-08-12 ENCOUNTER — Other Ambulatory Visit: Payer: Self-pay | Admitting: Orthopedic Surgery

## 2023-08-12 ENCOUNTER — Other Ambulatory Visit: Payer: Self-pay | Admitting: Podiatry

## 2023-08-12 MED ORDER — OXYCODONE-ACETAMINOPHEN 10-325 MG PO TABS: 1.0000 | ORAL_TABLET | Freq: Three times a day (TID) | ORAL | 0 refills | Status: AC | PRN

## 2023-08-12 NOTE — Telephone Encounter (Signed)
Stated could not walk on L leg to make it to his apt but needs a refill on his prescription for oxycodone.

## 2023-08-24 ENCOUNTER — Ambulatory Visit: Payer: 59 | Admitting: Podiatry

## 2023-09-04 ENCOUNTER — Other Ambulatory Visit: Payer: Self-pay | Admitting: Physician Assistant

## 2023-09-04 DIAGNOSIS — D509 Iron deficiency anemia, unspecified: Secondary | ICD-10-CM

## 2023-09-09 ENCOUNTER — Ambulatory Visit: Payer: 59 | Admitting: Podiatry

## 2023-09-14 ENCOUNTER — Encounter: Payer: Self-pay | Admitting: Podiatry

## 2023-09-14 ENCOUNTER — Other Ambulatory Visit: Payer: Self-pay | Admitting: Podiatry

## 2023-09-14 ENCOUNTER — Ambulatory Visit (INDEPENDENT_AMBULATORY_CARE_PROVIDER_SITE_OTHER): Payer: 59 | Admitting: Podiatry

## 2023-09-14 DIAGNOSIS — D367 Benign neoplasm of other specified sites: Secondary | ICD-10-CM | POA: Diagnosis not present

## 2023-09-14 DIAGNOSIS — M79676 Pain in unspecified toe(s): Secondary | ICD-10-CM | POA: Diagnosis not present

## 2023-09-14 DIAGNOSIS — G5782 Other specified mononeuropathies of left lower limb: Secondary | ICD-10-CM | POA: Diagnosis not present

## 2023-09-14 DIAGNOSIS — B351 Tinea unguium: Secondary | ICD-10-CM

## 2023-09-14 MED ORDER — OXYCODONE-ACETAMINOPHEN 10-325 MG PO TABS
1.0000 | ORAL_TABLET | Freq: Three times a day (TID) | ORAL | 0 refills | Status: AC | PRN
Start: 2023-09-14 — End: 2023-09-21

## 2023-09-14 NOTE — Progress Notes (Signed)
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  States that has been sore now for a week or so.  Objective: Vital signs are stable he is alert and oriented x 3 he is quite thin and has lost significant amount of weight over the past year and states that he does not know why he is losing weight nor does his doctors.  Pulses remain palpable bilateral.  Pain on palpation third interdigital space left foot.  Toenails are long thick yellow dystrophic onychomycotic  Assessment: Neuroma third interspace left.  Pain in limb secondary to onychomycosis  Plan: Debridement of toenails injected Kenalog to the third interdigital space.  Prescribed his oxycodone follow-up with him in 1 month

## 2023-09-21 ENCOUNTER — Other Ambulatory Visit: Payer: Self-pay

## 2023-09-21 ENCOUNTER — Encounter (HOSPITAL_COMMUNITY): Payer: Self-pay | Admitting: Orthopedic Surgery

## 2023-09-21 NOTE — Anesthesia Preprocedure Evaluation (Signed)
Anesthesia Evaluation  Patient identified by MRN, date of birth, ID band Patient awake    Reviewed: Allergy & Precautions, NPO status , Patient's Chart, lab work & pertinent test results  Airway Mallampati: II  TM Distance: >3 FB Neck ROM: Full    Dental  (+) Dental Advisory Given   Pulmonary COPD, Current Smoker and Patient abstained from smoking.   breath sounds clear to auscultation       Cardiovascular hypertension, Pt. on medications + CAD and + Peripheral Vascular Disease   Rhythm:Regular Rate:Normal     Neuro/Psych Seizures -,   Neuromuscular disease    GI/Hepatic Neg liver ROS,GERD  ,,  Endo/Other  diabetes, Type 2    Renal/GU Renal InsufficiencyRenal disease     Musculoskeletal  (+) Arthritis ,    Abdominal   Peds  Hematology  (+) Blood dyscrasia, anemia   Anesthesia Other Findings   Reproductive/Obstetrics                             Anesthesia Physical Anesthesia Plan  ASA: 3  Anesthesia Plan: General   Post-op Pain Management: Tylenol PO (pre-op)*   Induction: Intravenous  PONV Risk Score and Plan: 1 and Dexamethasone, Ondansetron and Treatment may vary due to age or medical condition  Airway Management Planned: LMA  Additional Equipment:   Intra-op Plan:   Post-operative Plan: Extubation in OR  Informed Consent: I have reviewed the patients History and Physical, chart, labs and discussed the procedure including the risks, benefits and alternatives for the proposed anesthesia with the patient or authorized representative who has indicated his/her understanding and acceptance.     Dental advisory given  Plan Discussed with: CRNA  Anesthesia Plan Comments: (PAT note by Antionette Poles, PA-C: 82 year old male with pertinent history including current smoker with associated COPD (not on any daily inhaled medications), remote history of seizures, frequent falls,  diet-controlled DM2, CKD 3, GERD, PAD, remote alcohol use disorder, iron deficiency anemia.  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  Last seen 08/03/2023, labs stable at that time, hemoglobin 11.0.  He was restarted on octreotide.  He was cleared to proceed with hand surgery.  Patient also has medical clearance from PCP Roselyn Reef, FNP dated 08/06/2023.  Copy on chart.  Patient will need day of surgery labs and evaluation.  EKG 04/08/2023: Sinus rhythm with occasional Premature ventricular complexes and Premature atrial complexes.  Rate 85.,  Nonspecific ST abnormality  TTE 02/02/2022: 1. Left ventricular ejection fraction, by estimation, is 45 to 50%. The  left ventricle has mildly decreased function. The left ventricle  demonstrates global hypokinesis. There is moderate left ventricular  hypertrophy. Left ventricular diastolic  parameters are indeterminate.  2. Right ventricular systolic function is normal. The right ventricular  size is normal.  3. The mitral valve is myxomatous. Mild mitral valve regurgitation.  4. The aortic valve is tricuspid. Aortic valve regurgitation is not  visualized. No aortic stenosis is present.   Nuclear stress 12/09/2016:   Nuclear stress EF: 43%.  There was no ST segment deviation noted during stress.  This is an intermediate risk study.  The left ventricular ejection fraction is moderately decreased (30-44%).   1. Fixed small, mild basal inferior perfusion defect.  This may be due to diaphragmatic attenuation.  No ischemia.  2. EF 43% with diffuse hypokinesis.  3. Intermediate risk study due to decreased  LV systolic function.    )        Anesthesia Quick Evaluation

## 2023-09-21 NOTE — Progress Notes (Signed)
PCP - [facility] -  none since 2019 Gastroenterology - Quentin Mulling, PA-C  Chest x-ray - 02/03/23 EKG - 04/08/23 Stress Test - 12/17/16 ECHO - 02/02/22 Cardiac Cath - n/a  ICD Pacemaker/Loop - n/a  Sleep Study -  n/a CPAP - none  Diabetes Type 2, no meds, diet controlled  Aspirin Instructions: Per  MD, ok to hold ASA for upcoming carpal tunnel release surgery.  TE 08/10/23.  ERAS: Clear liquids til 6 AM DOS.  Anesthesia review: Yes  STOP now taking any Aspirin (unless otherwise instructed by your surgeon), Aleve, Naproxen, Ibuprofen, Motrin, Advil, Goody's, BC's, all herbal medications, fish oil, and all vitamins.   Coronavirus Screening Do you have any of the following symptoms:  Cough yes/no: No Fever (>100.25F)  yes/no: No Runny nose yes/no: No Sore throat yes/no: No Difficulty breathing/shortness of breath  yes/no: No  Have you traveled in the last 14 days and where? yes/no: No  Patient verbalized understanding of instructions that were given via phone.

## 2023-09-21 NOTE — Progress Notes (Signed)
Anesthesia Chart Review: Same day workup  82 year old male with pertinent history including current smoker with associated COPD (not on any daily inhaled medications), remote history of seizures, frequent falls, diet-controlled DM2, CKD 3, GERD, PAD, remote alcohol use disorder, iron deficiency anemia.  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  Last seen 08/03/2023, labs stable at that time, hemoglobin 11.0.  He was restarted on octreotide.  He was cleared to proceed with hand surgery.  Patient also has medical clearance from PCP Roselyn Reef, FNP dated 08/06/2023.  Copy on chart.  Patient will need day of surgery labs and evaluation.  EKG 04/08/2023: Sinus rhythm with occasional Premature ventricular complexes and Premature atrial complexes.  Rate 85.,  Nonspecific ST abnormality  TTE 02/02/2022: 1. Left ventricular ejection fraction, by estimation, is 45 to 50%. The  left ventricle has mildly decreased function. The left ventricle  demonstrates global hypokinesis. There is moderate left ventricular  hypertrophy. Left ventricular diastolic  parameters are indeterminate.   2. Right ventricular systolic function is normal. The right ventricular  size is normal.   3. The mitral valve is myxomatous. Mild mitral valve regurgitation.   4. The aortic valve is tricuspid. Aortic valve regurgitation is not  visualized. No aortic stenosis is present.   Nuclear stress 12/09/2016:  Nuclear stress EF: 43%. There was no ST segment deviation noted during stress. This is an intermediate risk study. The left ventricular ejection fraction is moderately decreased (30-44%).   1. Fixed small, mild basal inferior perfusion defect.  This may be due to diaphragmatic attenuation.  No ischemia.  2. EF 43% with diffuse hypokinesis.  3. Intermediate risk study due to decreased LV systolic function.     Zannie Cove [facility] Short Stay  Center/Anesthesiology Phone [phone] 09/21/2023 12:12 PM

## 2023-09-23 ENCOUNTER — Encounter (HOSPITAL_COMMUNITY)
Admission: RE | Disposition: A | Discharge status: Home / Self Care | Payer: Self-pay | Source: Home / Self Care | Attending: Orthopedic Surgery

## 2023-09-23 ENCOUNTER — Ambulatory Visit (HOSPITAL_COMMUNITY): Payer: 59 | Admitting: Physician Assistant

## 2023-09-23 ENCOUNTER — Ambulatory Visit (HOSPITAL_COMMUNITY)
Admission: RE | Admit: 2023-09-23 | Discharge: 2023-09-23 | Disposition: A | Discharge status: Home / Self Care | Payer: 59 | Attending: Orthopedic Surgery | Admitting: Orthopedic Surgery

## 2023-09-23 ENCOUNTER — Other Ambulatory Visit: Payer: Self-pay

## 2023-09-23 ENCOUNTER — Ambulatory Visit (HOSPITAL_BASED_OUTPATIENT_CLINIC_OR_DEPARTMENT_OTHER): Payer: 59 | Admitting: Physician Assistant

## 2023-09-23 ENCOUNTER — Encounter (HOSPITAL_COMMUNITY): Payer: Self-pay | Admitting: Orthopedic Surgery

## 2023-09-23 DIAGNOSIS — J449 Chronic obstructive pulmonary disease, unspecified: Secondary | ICD-10-CM | POA: Diagnosis not present

## 2023-09-23 DIAGNOSIS — I2510 Atherosclerotic heart disease of native coronary artery without angina pectoris: Secondary | ICD-10-CM | POA: Insufficient documentation

## 2023-09-23 DIAGNOSIS — I129 Hypertensive chronic kidney disease with stage 1 through stage 4 chronic kidney disease, or unspecified chronic kidney disease: Secondary | ICD-10-CM | POA: Diagnosis not present

## 2023-09-23 DIAGNOSIS — D509 Iron deficiency anemia, unspecified: Secondary | ICD-10-CM | POA: Insufficient documentation

## 2023-09-23 DIAGNOSIS — Z79899 Other long term (current) drug therapy: Secondary | ICD-10-CM | POA: Insufficient documentation

## 2023-09-23 DIAGNOSIS — N183 Chronic kidney disease, stage 3 unspecified: Secondary | ICD-10-CM | POA: Diagnosis not present

## 2023-09-23 DIAGNOSIS — G5602 Carpal tunnel syndrome, left upper limb: Secondary | ICD-10-CM

## 2023-09-23 DIAGNOSIS — E1151 Type 2 diabetes mellitus with diabetic peripheral angiopathy without gangrene: Secondary | ICD-10-CM | POA: Diagnosis not present

## 2023-09-23 DIAGNOSIS — F17210 Nicotine dependence, cigarettes, uncomplicated: Secondary | ICD-10-CM | POA: Diagnosis not present

## 2023-09-23 DIAGNOSIS — Z452 Encounter for adjustment and management of vascular access device: Secondary | ICD-10-CM

## 2023-09-23 DIAGNOSIS — E1122 Type 2 diabetes mellitus with diabetic chronic kidney disease: Secondary | ICD-10-CM | POA: Diagnosis not present

## 2023-09-23 HISTORY — PX: HARDWARE REMOVAL: SHX979

## 2023-09-23 HISTORY — PX: CARPAL TUNNEL RELEASE: SHX101

## 2023-09-23 LAB — COMPREHENSIVE METABOLIC PANEL
ALT: 18 U/L (ref 0–44)
AST: 20 U/L (ref 15–41)
Albumin: 3.7 g/dL (ref 3.5–5.0)
Alkaline Phosphatase: 56 U/L (ref 38–126)
Anion gap: 14 (ref 5–15)
BUN: 36 mg/dL — ABNORMAL HIGH (ref 8–23)
CO2: 20 mmol/L — ABNORMAL LOW (ref 22–32)
Calcium: 9.1 mg/dL (ref 8.9–10.3)
Chloride: 103 mmol/L (ref 98–111)
Creatinine, Ser: 2.39 mg/dL — ABNORMAL HIGH (ref 0.61–1.24)
GFR, Estimated: 26 mL/min — ABNORMAL LOW (ref >60)
Glucose, Bld: 94 mg/dL (ref 70–99)
Potassium: 4.3 mmol/L (ref 3.5–5.1)
Sodium: 137 mmol/L (ref 135–145)
Total Bilirubin: 0.2 mg/dL — ABNORMAL LOW (ref 0.3–1.2)
Total Protein: 6.8 g/dL (ref 6.5–8.1)

## 2023-09-23 LAB — CBC
HCT: 34.8 % — ABNORMAL LOW (ref 39.0–52.0)
Hemoglobin: 10.5 g/dL — ABNORMAL LOW (ref 13.0–17.0)
MCH: 25.5 pg — ABNORMAL LOW (ref 26.0–34.0)
MCHC: 30.2 g/dL (ref 30.0–36.0)
MCV: 84.7 fL (ref 80.0–100.0)
Platelets: 304 10*3/uL (ref 150–400)
RBC: 4.11 MIL/uL — ABNORMAL LOW (ref 4.22–5.81)
RDW: 17.2 % — ABNORMAL HIGH (ref 11.5–15.5)
WBC: 9.3 10*3/uL (ref 4.0–10.5)
nRBC: 0 % (ref 0.0–0.2)

## 2023-09-23 LAB — GLUCOSE, CAPILLARY
Glucose-Capillary: 80 mg/dL (ref 70–99)
Glucose-Capillary: 96 mg/dL (ref 70–99)
Glucose-Capillary: 81 mg/dL (ref 70–99)

## 2023-09-23 SURGERY — CARPAL TUNNEL RELEASE
Anesthesia: General | Site: Wrist | Laterality: Left

## 2023-09-23 MED ORDER — INSULIN ASPART 100 UNIT/ML IJ SOLN: 0.0000 [IU] | INTRAMUSCULAR | Status: DC | PRN

## 2023-09-23 MED ORDER — PROPOFOL 10 MG/ML IV BOLUS
INTRAVENOUS | Status: AC
  Filled 2023-09-23: qty 20

## 2023-09-23 MED ORDER — OXYCODONE HCL 5 MG PO TABS: ORAL_TABLET | ORAL | 0 refills | Status: DC

## 2023-09-23 MED ORDER — BUPIVACAINE HCL (PF) 0.25 % IJ SOLN
INTRAMUSCULAR | Status: AC
  Filled 2023-09-23: qty 30

## 2023-09-23 MED ORDER — DEXAMETHASONE SODIUM PHOSPHATE 10 MG/ML IJ SOLN
INTRAMUSCULAR | Status: DC | PRN
  Administered 2023-09-23: 4 mg via INTRAVENOUS

## 2023-09-23 MED ORDER — FENTANYL CITRATE (PF) 250 MCG/5ML IJ SOLN
INTRAMUSCULAR | Status: DC | PRN
  Administered 2023-09-23 (×2): 50 ug via INTRAVENOUS

## 2023-09-23 MED ORDER — ORAL CARE MOUTH RINSE: 15.0000 mL | Freq: Once | OROMUCOSAL | Status: AC

## 2023-09-23 MED ORDER — LIDOCAINE 2% (20 MG/ML) 5 ML SYRINGE
INTRAMUSCULAR | Status: DC | PRN
  Administered 2023-09-23: 100 mg via INTRAVENOUS

## 2023-09-23 MED ORDER — DEXAMETHASONE SODIUM PHOSPHATE 10 MG/ML IJ SOLN
INTRAMUSCULAR | Status: AC
  Filled 2023-09-23: qty 1

## 2023-09-23 MED ORDER — LACTATED RINGERS IV SOLN: INTRAVENOUS | Status: DC

## 2023-09-23 MED ORDER — 0.9 % SODIUM CHLORIDE (POUR BTL) OPTIME
TOPICAL | Status: DC | PRN
  Administered 2023-09-23: 1000 mL

## 2023-09-23 MED ORDER — SODIUM CHLORIDE 0.9 % IV SOLN: INTRAVENOUS | Status: DC

## 2023-09-23 MED ORDER — BUPIVACAINE HCL (PF) 0.25 % IJ SOLN
INTRAMUSCULAR | Status: DC | PRN
  Administered 2023-09-23: 9 mL

## 2023-09-23 MED ORDER — CHLORHEXIDINE GLUCONATE 0.12 % MT SOLN
15.0000 mL | Freq: Once | OROMUCOSAL | Status: AC
  Administered 2023-09-23: 15 mL via OROMUCOSAL
  Filled 2023-09-23: qty 15

## 2023-09-23 MED ORDER — CEFAZOLIN SODIUM-DEXTROSE 2-4 GM/100ML-% IV SOLN
2.0000 g | INTRAVENOUS | Status: AC
  Administered 2023-09-23: 2 g via INTRAVENOUS
  Filled 2023-09-23: qty 100

## 2023-09-23 MED ORDER — LIDOCAINE 2% (20 MG/ML) 5 ML SYRINGE
INTRAMUSCULAR | Status: AC
  Filled 2023-09-23: qty 5

## 2023-09-23 MED ORDER — FENTANYL CITRATE (PF) 250 MCG/5ML IJ SOLN
INTRAMUSCULAR | Status: AC
  Filled 2023-09-23: qty 5

## 2023-09-23 MED ORDER — ONDANSETRON HCL 4 MG/2ML IJ SOLN
INTRAMUSCULAR | Status: AC
  Filled 2023-09-23: qty 2

## 2023-09-23 MED ORDER — PROPOFOL 10 MG/ML IV BOLUS
INTRAVENOUS | Status: DC | PRN
  Administered 2023-09-23: 150 mg via INTRAVENOUS

## 2023-09-23 MED ORDER — ONDANSETRON HCL 4 MG/2ML IJ SOLN
INTRAMUSCULAR | Status: DC | PRN
  Administered 2023-09-23: 4 mg via INTRAVENOUS

## 2023-09-23 SURGICAL SUPPLY — 48 items
APL PRP STRL LF DISP 70% ISPRP (MISCELLANEOUS)
BAG COUNTER SPONGE SURGICOUNT (BAG) ×2 IMPLANT
BAG SPNG CNTER NS LX DISP (BAG) ×2
BNDG CMPR 5X3 KNIT ELC UNQ LF (GAUZE/BANDAGES/DRESSINGS) ×2
BNDG CMPR 9X4 STRL LF SNTH (GAUZE/BANDAGES/DRESSINGS)
BNDG ELASTIC 3INX 5YD STR LF (GAUZE/BANDAGES/DRESSINGS) ×2 IMPLANT
BNDG ELASTIC 4X5.8 VLCR STR LF (GAUZE/BANDAGES/DRESSINGS) ×2 IMPLANT
BNDG ESMARK 4X9 LF (GAUZE/BANDAGES/DRESSINGS) ×2 IMPLANT
BNDG GAUZE DERMACEA FLUFF 4 (GAUZE/BANDAGES/DRESSINGS) ×2 IMPLANT
BNDG GZE DERMACEA 4 6PLY (GAUZE/BANDAGES/DRESSINGS) ×2
CHLORAPREP W/TINT 26 (MISCELLANEOUS) ×2 IMPLANT
CORD BIPOLAR FORCEPS 12FT (ELECTRODE) ×2 IMPLANT
COVER SURGICAL LIGHT HANDLE (MISCELLANEOUS) ×2 IMPLANT
CUFF TOURN SGL QUICK 18X4 (TOURNIQUET CUFF) ×2 IMPLANT
CUFF TOURN SGL QUICK 24 (TOURNIQUET CUFF)
CUFF TRNQT CYL 24X4X16.5-23 (TOURNIQUET CUFF) IMPLANT
DRAPE SURG 17X23 STRL (DRAPES) ×2 IMPLANT
DRSG XEROFORM 1X8 (GAUZE/BANDAGES/DRESSINGS) IMPLANT
GAUZE PAD ABD 8X10 STRL (GAUZE/BANDAGES/DRESSINGS) ×4 IMPLANT
GAUZE SPONGE 4X4 12PLY STRL (GAUZE/BANDAGES/DRESSINGS) ×2 IMPLANT
GAUZE XEROFORM 1X8 LF (GAUZE/BANDAGES/DRESSINGS) ×2 IMPLANT
GLOVE BIO SURGEON STRL SZ7.5 (GLOVE) ×4 IMPLANT
GLOVE BIOGEL PI IND STRL 8 (GLOVE) ×2 IMPLANT
GOWN STRL REUS W/ TWL LRG LVL3 (GOWN DISPOSABLE) ×4 IMPLANT
GOWN STRL REUS W/TWL LRG LVL3 (GOWN DISPOSABLE) ×4
KIT BASIN OR (CUSTOM PROCEDURE TRAY) ×2 IMPLANT
KIT TURNOVER KIT B (KITS) ×2 IMPLANT
NDL HYPO 25GX1X1/2 BEV (NEEDLE) ×2 IMPLANT
NEEDLE HYPO 25GX1X1/2 BEV (NEEDLE) ×2
NS IRRIG 1000ML POUR BTL (IV SOLUTION) ×2 IMPLANT
PACK ORTHO EXTREMITY (CUSTOM PROCEDURE TRAY) ×2 IMPLANT
PAD ABD 8X10 STRL (GAUZE/BANDAGES/DRESSINGS) IMPLANT
PAD ARMBOARD 7.5X6 YLW CONV (MISCELLANEOUS) ×4 IMPLANT
PADDING CAST ABS COTTON 4X4 ST (CAST SUPPLIES) ×2 IMPLANT
SPONGE T-LAP 4X18 ~~LOC~~+RFID (SPONGE) ×2 IMPLANT
SUCTION TUBE FRAZIER 10FR DISP (SUCTIONS) ×2 IMPLANT
SUT ETHILON 3 0 PS 1 (SUTURE) ×2 IMPLANT
SUT ETHILON 4 0 PS 2 18 (SUTURE) IMPLANT
SUT NYLON ETHILON 4-0 PS-2 18 (SUTURE) IMPLANT
SUT VIC AB 3-0 SH 18 (SUTURE) ×2 IMPLANT
SUT VIC AB 3-0 SH 27 (SUTURE)
SUT VIC AB 3-0 SH 27X BRD (SUTURE) ×2 IMPLANT
SYR CONTROL 10ML LL (SYRINGE) ×2 IMPLANT
TOWEL GREEN STERILE (TOWEL DISPOSABLE) ×2 IMPLANT
TOWEL GREEN STERILE FF (TOWEL DISPOSABLE) ×2 IMPLANT
TUBE CONNECTING 12X1/4 (SUCTIONS) ×2 IMPLANT
UNDERPAD 30X36 HEAVY ABSORB (UNDERPADS AND DIAPERS) ×2 IMPLANT
WATER STERILE IRR 1000ML POUR (IV SOLUTION) ×2 IMPLANT

## 2023-09-23 NOTE — Progress Notes (Signed)
Dr. Glade Stanford made aware that patient's Creatinine is 2.39 on today's CMP. No new orders received.

## 2023-09-23 NOTE — Anesthesia Procedure Notes (Signed)
Procedure Name: LMA Insertion Date/Time: 09/23/2023 9:40 AM  Performed by: Allyn Kenner, CRNAPre-anesthesia Checklist: Patient identified, Emergency Drugs available, Suction available and Patient being monitored Patient Re-evaluated:Patient Re-evaluated prior to induction Oxygen Delivery Method: Circle System Utilized Preoxygenation: Pre-oxygenation with 100% oxygen Induction Type: IV induction Ventilation: Mask ventilation without difficulty LMA: LMA inserted LMA Size: 5.0 Number of attempts: 1 Airway Equipment and Method: Bite block Placement Confirmation: positive ETCO2 Tube secured with: Tape Dental Injury: Teeth and Oropharynx as per pre-operative assessment

## 2023-09-23 NOTE — H&P (Signed)
Russell [name] is an 82 y.o. male.   Chief Complaint: carpal tunnel, retained hardware HPI: 82 yo male with left carpal tunnel syndrome and retained hardware.  Nocturnal symptoms.  Positive nerve conduction studies.  He wishes to have left carpal tunnel release and removal of retained hardware.  Allergies:  Allergies  Allergen Reactions   Ultram [Tramadol] Other (See Comments)    Confusion     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   COPD (chronic obstructive pulmonary disease) (HCC)    Coronary atherosclerosis    denied knowledge of this 10/27/16   Depression    Diverticulosis of colon    DYSPHAGIA UNSPECIFIED 05/16/2009   Qualifier: Diagnosis of  By: Delrae Alfred MD, Elizabeth     Dyspnea    EARLY SATIETY 11/04/2007   Qualifier: Diagnosis of  By: Delrae Alfred MD, Lanora Manis     ED (erectile dysfunction)    Full dentures    GERD (gastroesophageal reflux disease)    not current   Glaucoma    History of gastritis    History of scabies    2008   History of seizure    2013-- x2   idiopathic --  none since   History of syncope    03-22-2014  DX  VAGAL RESPONSE   Hyperlipidemia, mixed    Hypertension    Mitral regurgitation    Peripheral arterial disease (HCC)    one vessel runoff bilaterally via peroneal arteries   Prostate cancer (HCC)    Seizures (HCC) 03/2014   ? or syncope- patient refused to be admitted for further studies- "felt better"   Type 2 diabetes, diet  controlled (HCC)    no meds; patient and his wife said no- have not been told 01/21/17   Wears glasses     Past Surgical History:  Procedure Laterality Date   ABDOMINAL AORTOGRAM W/LOWER EXTREMITY Left 12/18/2020   Procedure: ABDOMINAL AORTOGRAM W/LOWER EXTREMITY;  Surgeon: Leonie Douglas, MD;  [facility];  Service: Cardiovascular;  Laterality: Left;   ABDOMINAL AORTOGRAM W/LOWER EXTREMITY N/A 02/26/2021   Procedure: ABDOMINAL AORTOGRAM W/LOWER EXTREMITY;  Surgeon: Leonie Douglas, MD;  [facility]: [facility];  Service: Cardiovascular;  Laterality: N/A;   ANTERIOR CERVICAL DECOMP/DISCECTOMY FUSION  10-16-2009   C4 -- C6   ANTERIOR CERVICAL DECOMP/DISCECTOMY FUSION N/A 01/27/2017   Procedure: ANTERIOR CERVICAL DECOMPRESSION FUSION CERVICAL 3-4 WITH INSTRUMENTATION AND ALLOGRAFT;  Surgeon: Estill Bamberg, MD;  Location: [facility];  Service: Orthopedics;  Laterality: N/A;  ANTERIOR CERVICAL DECOMPRESSION FUSION CERVICAL 3-4 WITH INSTRUMENTATION AND ALLOGRAFT   CAPSULOTOMY Right 01/26/2013   Procedure: MINOR CAPSULOTOMY;  Surgeon: Vita Erm., MD;  Location: [facility] OR;  Service: Ophthalmology;  Laterality: Right;   CARPAL TUNNEL RELEASE Left 01/02/2019   Procedure: LEFT CARPAL TUNNEL RELEASE;  Surgeon: Betha Loa, MD;  Location: [facility];  Service: Orthopedics;  Laterality: Left;   CARPECTOMY Right 03/13/2014   Procedure: RIGHT  PROXIMAL ROW  CARPECTOMY;  Surgeon: Tami Ribas, MD;  Location: [facility];  Service: Orthopedics;  Laterality: Right;   CARPECTOMY Left 10/22/2015   Procedure: LEFT WRIST PROXIMAL ROW CARPECTOMY ;  Surgeon: Betha Loa, MD;  Location: [facility];  Service: Orthopedics;  Laterality: Left;   CARPOMETACARPAL (CMC) FUSION OF THUMB Right 03/13/2014   Procedure: RIGHT FUSION OF THUMB CARPOMETACARPAL [facility]) JOINT;  Surgeon: Tami Ribas, MD;  Location: [facility];  Service: Orthopedics;   Laterality: Right;   CARPOMETACARPEL SUSPENSION PLASTY Left 04/04/2020   Procedure: LEFT THUMB TRAPECIECTOMY AND SUSPENSIONPLASTY;  Surgeon: Betha Loa, MD;  Location: [facility];  Service: Orthopedics;  Laterality: Left;   CATARACT EXTRACTION W/ INTRAOCULAR LENS  IMPLANT, BILATERAL  2009   COLONOSCOPY  08-31-2007   COLONOSCOPY     COLONOSCOPY N/A 03/06/2023   Procedure: COLONOSCOPY;  Surgeon: Mansouraty, Netty Starring., MD;  Location: [facility] ENDOSCOPY;  Service: Gastroenterology;  Laterality: N/A;   CRYOABLATION N/A 01/14/2015   Procedure: CRYO ABLATION PROSTATE;  Surgeon: Kathi Ludwig, MD;  Location: [facility];  Service: Urology;  Laterality: N/A;   ENTEROSCOPY N/A 03/06/2023   Procedure: ENTEROSCOPY;  Surgeon: Meridee Score Netty Starring., MD;  Location: [facility]COPY;  Service: Gastroenterology;  Laterality: N/A;   ENTEROSCOPY N/A 04/09/2023   Procedure: ENTEROSCOPY;  Surgeon: Benancio Deeds, MD;  Location: [facility] ENDOSCOPY;  Service: Gastroenterology;  Laterality: N/A;   ESOPHAGOGASTRODUODENOSCOPY  last one 09-11-2008   ESOPHAGOGASTRODUODENOSCOPY (EGD) WITH PROPOFOL N/A 02/04/2023   Procedure: ESOPHAGOGASTRODUODENOSCOPY (EGD) WITH PROPOFOL;  Surgeon: Jenel Lucks, MD;  Location: [facility] ENDOSCOPY;  Service: Gastroenterology;  Laterality: N/A;   EXCISION MASS LEFT FACE , SUBORBITAL AREA, PLASTER RECONSTRUCTION  02-09-2011   EXCISIONAL DEBRIDEMENT AND REPAIR RIGHT QUADRICEP TENDON  07-05-2000   FOOT SURGERY Left    GIVENS CAPSULE STUDY N/A 04/09/2023   Procedure: GIVENS CAPSULE STUDY;  Surgeon: Benancio Deeds, MD;  Location: [facility] END[facility]COPY;  Service: Gastroenterology;  Laterality: N/A;   HOT HEMOSTASIS N/A 02/04/2023   Procedure: HOT HEMOSTASIS (ARGON PLASMA COAGULATION/BICAP);  Surgeon: Jenel Lucks, MD;  Location: [facility] END[facility]COPY;  Service: Gastroenterology;  Laterality: N/A;   HOT HEMOSTASIS N/A 03/06/2023   Procedure: HOT HEMOSTASIS (ARGON PLASMA  COAGULATION/BICAP);  Surgeon: Lemar Lofty., MD;  Location: [facility] ENDOSCOPY;  Service: Gastroenterology;  Laterality: N/A;   HOT HEMOSTASIS N/A 04/09/2023   Procedure: HOT HEMOSTASIS (ARGON PLASMA COAGULATION/BICAP);  Surgeon: Benancio Deeds, MD;  Location: [facility] ENDOSCOPY;  Service: Gastroenterology;  Laterality: N/A;   I & D EXTREMITY Right 05/24/2013   Procedure: RIGHT INDEX WOUND DEBRIDEMENT AND CLOSURE;  Surgeon: Jodi Marble, MD;  Location: [facility];  Service: Orthopedics;  Laterality: Right;   INSERTION OF SUPRAPUBIC CATHETER N/A 01/14/2015   Procedure: SUPRAPUBIC TUBE PLACEMENT;  Surgeon: Kathi Ludwig, MD;  Location: [facility];  Service: Urology;  Laterality: N/A;   KNEE ARTHROSCOPY Right 2008   LARYNGOSCOPY AND ESOPHAGOSCOPY  06-13-2010   MARSUPIALIZATION LEFT LARGE VALLECULAR CYST   MASS EXCISION Left 10/22/2015   Procedure: EXCISION MASS OF LEFT INDEX FINGER;  Surgeon: Betha Loa, MD;  Location:  SURGERY CENTER;  Service: Orthopedics;  Laterality: Left;   ORCHIECTOMY Left 02/24/2015   Procedure: SCROTAL EXPLORATION WITH LEFT ORCHIECTOMY;  Surgeon: Ihor Gully, MD;  Location: [facility] OR;  Service: Urology;  Laterality: Left;   PERIPHERAL VASCULAR INTERVENTION Left 02/26/2021   Procedure: PERIPHERAL VASCULAR INTERVENTION;  Surgeon: Leonie Douglas, MD;  [facility];  Service: Cardiovascular;  Laterality: Left;  Superficial femoral   POLYPECTOMY  03/06/2023   Procedure: POLYPECTOMY;  Surgeon: Mansouraty, Netty Starring., MD;  Location: [facility] ENDOSCOPY;  Service: Gastroenterology;;   POSTERIOR CERVICAL FUSION/FORAMINOTOMY N/A 01/28/2017   Procedure: POSTERIOR SPINAL FUSION CERVICAL 3-4, CERVICAL 4-5, CERVICAL 5-6, CERVICAL 6-7 WITH INSTRUMENATION AND ALLOGRAFT;  Surgeon: Estill Bamberg, MD;  [facility];  Service: Orthopedics;  Laterality: N/A;  POSTERIOR SPINAL FUSION CERVICAL 3-4, CERVICAL 4-5, CERVICAL 5-6, CERVICAL 6-7  WITH INSTRUMENATION AND ALLOGRAFT   SHOULDER ARTHROSCOPY/ DEBRIDEMENT LABRAL TEAR/  BICEPS TENOTOMY Left 09-24-2011   SUBMUCOSAL TATTOO INJECTION  03/06/2023   Procedure: SUBMUCOSAL TATTOO INJECTION;  Surgeon: Lemar Lofty., MD;  Location: [facility] ENDOSCOPY;  Service: Gastroenterology;;   TONSILLECTOMY  as child   TRANSTHORACIC ECHOCARDIOGRAM  02-21-2010   normal LVF/  ef 55-60%/ mild to moderate MR/  moderate LAE/  mild TR   YAG LASER APPLICATION Right 01/26/2013   Procedure: YAG LASER APPLICATION;  Surgeon: Vita Erm., MD;  Location: [facility] OR;  Service: Ophthalmology;  Laterality: Right;   YAG LASER CAPSULOTOMY, LEFT EYE  01-08-2011    Family History: Family History  Problem Relation Age of Onset   Unexplained death Mother        died at a young age, no known cause   Heart attack Father        age unknown   Cancer Sister        unknown   Diabetes Other    Cancer Other    Colon cancer Neg Hx    Stomach cancer Neg Hx    Esophageal cancer Neg Hx     Social History:   reports that he has been smoking cigarettes. He has a 59 pack-year smoking history. He has never used smokeless tobacco. He reports that he does not drink alcohol and does not use drugs.  Medications: Medications Prior to Admission  Medication Sig Dispense Refill   ALPHAGAN P 0.1 % SOLN Place 1 drop into the right eye 3 (three) times daily.     aspirin EC 81 MG tablet Take 81 mg by mouth daily. Swallow whole.     bimatoprost (LUMIGAN) 0.01 % SOLN Place 1 drop into both eyes at bedtime. 30 mL 3   feeding supplement (ENSURE ENLIVE / ENSURE PLUS) LIQD Take 237 mLs by mouth 3 (three) times daily between meals. 237 mL 12   FEROSUL 325 (65 Fe) MG tablet TAKE 1 TABLET BY MOUTH DAILY WITH BREAKFAST 30 tablet 0   Fish Oil-Cholecalciferol (FISH OIL + D3 PO) Take 1 capsule by mouth daily.     meclizine (ANTIVERT) 25 MG tablet Take 25 mg by mouth 3 (three) times daily as needed for dizziness.     mirtazapine  (REMERON) 15 MG tablet Take 15 mg by mouth at bedtime.     oxybutynin (DITROPAN-XL) 10 MG 24 hr tablet Take 1 tablet by mouth daily.     pravastatin (PRAVACHOL) 80 MG tablet Take 80 mg by mouth daily.  2   octreotide (SANDOSTATIN) 100 MCG/ML SOLN injection Inject 1 mL (100 mcg total) into the skin 3 (three) times daily. (Patient not taking: Reported on 09/14/2023) 90 mL 2   oxyCODONE-acetaminophen (PERCOCET) 10-325 MG tablet Take 1 tablet by mouth every 4 (four) hours as needed for pain. (Patient not taking: Reported on 08/03/2023) 30 tablet 0   polyethylene glycol (MIRALAX / GLYCOLAX) 17 g packet Take 17 g by mouth  2 (two) times daily. (Patient not taking: Reported on 09/14/2023) 60 packet 3    Results for orders placed or performed during the hospital encounter of 09/23/23 (from the past 48 hour(s))  Glucose, capillary     Status: None   Collection Time: 09/23/23  6:28 AM  Result Value Ref Range   Glucose-Capillary 80 70 - 99 mg/dL    Comment: Glucose reference range applies only to samples taken after fasting for at least 8 hours.  CBC per protocol     Status: Abnormal   Collection Time: 09/23/23  7:16 AM  Result Value Ref Range   WBC 9.3 4.0 - 10.5 K/uL   RBC 4.11 (L) 4.22 - 5.81 MIL/uL   Hemoglobin 10.5 (L) 13.0 - 17.0 g/dL   HCT 59.5 (L) 63.8 - 75.6 %   MCV 84.7 80.0 - 100.0 fL   MCH 25.5 (L) 26.0 - 34.0 pg   MCHC 30.2 30.0 - 36.0 g/dL   RDW 43.3 (H) 29.5 - 18.8 %   Platelets 304 150 - 400 K/uL   nRBC 0.0 0.0 - 0.2 %    Comment: Performed at [facility], [address] [place], [place] [address]    No results found.    Blood pressure 129/64, pulse 67, temperature 98.1 F (36.7 C), temperature source Oral, resp. rate 17, height 6\' 2"  (1.88 m), weight 72.6 kg, SpO2 100%.  General appearance: alert, cooperative, and appears stated age Head: Normocephalic, without obvious abnormality, atraumatic Neck: supple, symmetrical, trachea midline Extremities: Intact capillary  refill all digits.  +epl/fpl/io.  No wounds.  Pulses: 2+ and symmetric Skin: Skin color, texture, turgor normal. No rashes or lesions Neurologic: Grossly normal Incision/Wound: none  Assessment/Plan Left carpal tunnel syndrome and retained hardware left hand.  Non operative and operative treatment options have been discussed with the patient and patient wishes to proceed with operative treatment. Risks, benefits, and alternatives of surgery have been discussed and the patient agrees with the plan of care.   Betha Loa 09/23/2023, 7:42 AM

## 2023-09-23 NOTE — Transfer of Care (Signed)
Immediate Anesthesia Transfer of Care Note  Patient: Russell [name]  Procedure(s) Performed: LEFT CARPAL TUNNEL RELEASE (Left: Wrist) REMOVAL RETAINED HARDWARE LEFT HAND (Left: Hand)  Patient Location: PACU  Anesthesia Type:General  Level of Consciousness: awake, alert , and oriented  Airway & Oxygen Therapy: Patient Spontanous Breathing and Patient connected to face mask oxygen  Post-op Assessment: Report given to RN and Post -op Vital signs reviewed and stable  Post vital signs: Reviewed and stable  Last Vitals:  Vitals Value Taken Time  BP 145/80   Temp    Pulse    Resp 21 09/23/23 1042  SpO2    Vitals shown include unfiled device data.  Last Pain:  Vitals:   09/23/23 0651  TempSrc:   PainSc: 8       Patients Stated Pain Goal: 0 (09/23/23 0651)  Complications: No notable events documented.

## 2023-09-23 NOTE — Progress Notes (Signed)
Dr. Merlyn Lot made aware that patient last took Aspirin 81 mg yesterday morning. Ok per Dr. Merlyn Lot. No new orders received.

## 2023-09-23 NOTE — Op Note (Addendum)
09/23/2023 [facility] OR                              OPERATIVE REPORT   PREOPERATIVE DIAGNOSIS: 1.  Carpal tunnel syndrome 2.  Retained internal suture anchors/hardware  POSTOPERATIVE DIAGNOSIS: 1.  Carpal tunnel syndrome 2.  Retained internal suture anchor/hardware  PROCEDURE: 1.  Carpal tunnel release 2.  Removal of retained internal suture anchor/hardware via separate incisions  SURGEON:  Betha Loa, MD  ASSISTANT: Cindee Salt MD  ANESTHESIA: General  IV FLUIDS:  Per anesthesia flow sheet.  ESTIMATED BLOOD LOSS:  Minimal.  COMPLICATIONS:  None.  SPECIMENS:  None.  TOURNIQUET TIME:    Total Tourniquet Time Documented: Forearm (Left) - 36 minutes Total: Forearm (Left) - 36 minutes   DISPOSITION:  Stable to PACU.  LOCATION: [facility] OR  INDICATIONS:  82 y.o. yo male with numbness and tingling left hand.  Nocturnal symptoms. Positive nerve conduction studies. He wishes to proceed with left carpal tunnel release.  He also has retained suture anchor hardware from a suspensionplasty.  Plans for removal of this as well.  Risks, benefits and alternatives of surgery were discussed including the risk of blood loss; infection; damage to nerves, vessels, tendons, ligaments, bone; failure of surgery; need for additional surgery; complications with wound healing; continued pain; recurrence of carpal tunnel syndrome; and damage to motor branch. He voiced understanding of these risks and elected to proceed.   OPERATIVE COURSE:  After being identified preoperatively by myself, the patient and I agreed upon the procedure and site of procedure.  The surgical site was marked.  Surgical consent had been signed.  He was given preoperative IV antibiotic prophylaxis.  He was transferred to the operating room and placed on the operating room table in supine position with the left upper extremity on an armboard.  General anesthesia was induced by the anesthesiologist.  Left upper extremity was prepped and  draped in normal sterile orthopaedic fashion.  A surgical pause was performed between the surgeons, anesthesia, and operating room staff, and all were in agreement as to the patient, procedure, and site of procedure.  Tourniquet at the proximal aspect of the forearm was inflated to 250 mmHg after exsanguination of the arm with an Esmarch bandage  Incision was made over the transverse carpal ligament and carried into the subcutaneous tissues by spreading technique.  There was scarring in the subcutaneous tissues and fascia.  The median nerve was identified distal to the transverse carpal ligament.  The nerve was protected and the ligament and scar carefully incised from distal to proximal under direct visualization.  The distal aspect of the volar antebrachial fascia was able to be released under direct visualization underneath the skin.  There was some scar surrounding the nerve which was carefully freed up.  The nerve was intact.  Motor branch was identified and was intact.  A finger was placed into the wound to ensure complete decompression, which was the case.  The wound was copiously irrigated with sterile saline.  An incision was then made in the previous incision site at the base of the thumb metacarpal.  This was carried in subcutaneous tissues by spreading technique.  The suture anchor from the microleak suture was identified.  This was freed up from surrounding soft tissues.  The suture underneath was released with a knife.  The suture anchor was removed.  An incision was then made on the dorsum of the hand into  the other side of the suture anchor identified.  It was cleared up from soft tissue and removed including the suture that was passing between the 2 anchors.  The wounds were all copiously irrigated with sterile saline.  They were then closed with 4-0 nylon in a horizontal mattress fashion.  They were injected with 0.25% plain Marcaine to aid in postoperative analgesia.  They were dressed with  sterile Xeroform, 4x4s, an ABD, and wrapped with Kerlix and an Ace bandage.  Tourniquet was deflated at 36 minutes.  Fingertips were pink with brisk capillary refill after deflation of the tourniquet.  Operative drapes were broken down.  The patient was awoken from anesthesia safely.  He was transferred back to stretcher and taken to the PACU in stable condition.  I will see him back in the office in 1 week for postoperative followup.  I will give him a prescription for oxycodone 5 mg 1 p.o. every 6 hours as needed pain dispense #20.    Betha Loa, MD Electronically signed, 09/23/23

## 2023-09-23 NOTE — Op Note (Signed)
I assisted Surgeons and Role:    * Betha Loa, MD - Primary    Cindee Salt, MD - Assisting on the Procedure(s): LEFT CARPAL TUNNEL RELEASE REMOVAL RETAINED HARDWARE LEFT HAND on 09/23/2023.  I provided assistance on this case as follows: Set up, approach, retraction for exploration and decompression median nerve closure of the wound Approach, identification of the internal fixation device/hardware from the first and second metacarpal bases, closure of the wounds, stressing for prior reconstruction and application of the dressing.  Electronically signed by: Cindee Salt, MD Date: 09/23/2023 Time: 10:32 AM

## 2023-09-23 NOTE — Discharge Instructions (Signed)

## 2023-09-23 NOTE — Progress Notes (Signed)
At time the patient was ready to be discharged, patient was insisting that he was going to drive himself home.  Preoperatively, the patient had stated that his daughter would be picking him up to take him home.  Multiple attempts were made to reach the daughter to pick up the patient with no success.  The wife was also contacted and informed the nurse that she is not able to pick up the patient and the daughter is not able to either due a busted water pipe.  The wife stated that the patient knew he would not have a ride home and although she instructed him to cancel surgery, he refused.   The wife informed the nurse that she would try to contact the son to pick him up but was uncertain if he could and refused to give the number to the nurse.  The patient was still insisting that he could drive home but was educated by two nurses, Dr. Sampson Goon and social worker that he could not drive home.  Social work came to bedside and provided a taxi voucher for the patient, discharged instructions were provided to the patient's wife who stated she will receive the patient at home.  Patient escorted to main entrance to ensure that patient was picked up by the taxi.  Patient ambulated to taxi and refused assistance.

## 2023-09-23 NOTE — Discharge Planning (Signed)
[facility] department consulted regarding transportation needs for pt.  RNCM provided taxi voucher as pt has no transportation from hospital.

## 2023-09-23 NOTE — Anesthesia Postprocedure Evaluation (Signed)
Anesthesia Post Note  Patient: Russell [name]  Procedure(s) Performed: LEFT CARPAL TUNNEL RELEASE (Left: Wrist) REMOVAL RETAINED HARDWARE LEFT HAND (Left: Hand)     Patient location during evaluation: PACU Anesthesia Type: General Level of consciousness: awake and alert Pain management: pain level controlled Vital Signs Assessment: post-procedure vital signs reviewed and stable Respiratory status: spontaneous breathing, nonlabored ventilation, respiratory function stable and patient connected to nasal cannula oxygen Cardiovascular status: blood pressure returned to baseline and stable Postop Assessment: no apparent nausea or vomiting Anesthetic complications: no  There were no known notable events for this encounter.  Last Vitals:  Vitals:   09/23/23 1115 09/23/23 1130  BP: 134/64 (!) 152/71  Pulse: (!) 55 61  Resp: 13 16  Temp:  (!) 36.2 C  SpO2: 97% 96%    Last Pain:  Vitals:   09/23/23 1100  TempSrc:   PainSc: 0-No pain                 Kennieth Rad

## 2023-09-24 ENCOUNTER — Other Ambulatory Visit: Payer: Self-pay | Admitting: Physician Assistant

## 2023-09-24 ENCOUNTER — Encounter (HOSPITAL_COMMUNITY): Payer: Self-pay | Admitting: Orthopedic Surgery

## 2023-09-24 DIAGNOSIS — D509 Iron deficiency anemia, unspecified: Secondary | ICD-10-CM

## 2023-10-13 NOTE — Progress Notes (Deleted)
[facility] CONSULT NOTE  Patient Care Team: Valerie Roys, FNP as PCP - General (Nurse Practitioner) Glendale Chard, DO as Consulting Physician (Neurology)  ASSESSMENT & PLAN:  @AGE  male with history of *** being seen for iron deficiency anemia.  No problem-specific Assessment & Plan notes found for this encounter.  No orders of the defined types were placed in this encounter.   Relevant history: chronic disease. Small bowel AVM bleed.  Last colonoscopy: 02/2023. Diverticulosis in the entire examined colon. - Two 3 to 14 mm polyps at the recto- sigmoid colon and in the ascending colon, removed with a cold snare. Resected and retrieved.  Last EGD: 03/2023. 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 Treated with argon plasma coagulation ( APC) . 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 Treated with argon plasma coagulation ( APC) . - Two non- bleeding angiodysplastic lesions in the duodenum. Treated with argon plasma coagulation ( APC). Hiatal hernia.  The mechanism of IDA is due to either blood loss or decreased absorptive mechanism or both. We discussed some of the risks, benefits, and alternatives of intravenous iron infusions. The patient is symptomatic from anemia and the iron level is critically low. He tolerated oral iron supplement poorly and desires to achieved higher levels of iron faster for adequate hematopoesis. Some of the side-effects to be expected including risks of infusion reactions, phlebitis, headaches, nausea and fatigue.  The patient is willing to proceed. Patient education material was dispensed.   IV iron *** ordered. CBC with blood smear Iron, TIBC, ferritin, iron saturation in *** Referral for colonoscopy and EGD  All questions were answered. The patient knows to call the clinic with any problems, questions or concerns.  Melven Sartorius, MD 10/23/202410:26 AM   CHIEF  COMPLAINTS/PURPOSE OF CONSULTATION:  Anemia  HISTORY OF PRESENTING ILLNESS:  Russell [name] 82 y.o. male is here because of anemia.  Khoury had not noticed any recent bleeding such as epistaxis, hematuria or hematochezia ***The patient denies over the counter NSAID ingestion. He is not *** on antiplatelets agents. His last colonoscopy was *** ***He had no prior history or diagnosis of cancer. His age appropriate screening programs are up-to-date. ***He denies any pica and eats a variety of diet. ***He never donated blood or received blood transfusion ***The patient was prescribed oral iron supplements and he takes ***  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   COPD (chronic obstructive pulmonary disease) (HCC)    Coronary atherosclerosis    denied knowledge of this 10/27/16   Depression    Diverticulosis of colon    DYSPHAGIA UNSPECIFIED 05/16/2009   Qualifier: Diagnosis of  By: Delrae Alfred MD, Elizabeth     Dyspnea    EARLY SATIETY 11/04/2007   Qualifier: Diagnosis of  By: Delrae Alfred MD, [facility]     ED (erectile dysfunction)    Full dentures    GERD (gastroesophageal  reflux disease)    not current   Glaucoma    History of gastritis    History of scabies    2008   History of seizure    2013-- x2   idiopathic --  none since   History of syncope    03-22-2014  DX  VAGAL RESPONSE   Hyperlipidemia, mixed    Hypertension    Mitral regurgitation     Peripheral arterial disease (HCC)    one vessel runoff bilaterally via peroneal arteries   Prostate cancer (HCC)    Seizures (HCC) 03/2014   ? or syncope- patient refused to be admitted for further studies- "felt better"   Type 2 diabetes, diet controlled (HCC)    no meds; patient and his wife said no- have not been told 01/21/17   Wears glasses     SURGICAL HISTORY: Past Surgical History:  Procedure Laterality Date   ABDOMINAL AORTOGRAM W/LOWER EXTREMITY Left 12/18/2020   Procedure: ABDOMINAL AORTOGRAM W/LOWER EXTREMITY;  Surgeon: Leonie Douglas, MD;  [facility];  Service: Cardiovascular;  Laterality: Left;   ABDOMINAL AORTOGRAM W/LOWER EXTREMITY N/A 02/26/2021   Procedure: ABDOMINAL AORTOGRAM W/LOWER EXTREMITY;  Surgeon: Leonie Douglas, MD;  [facility];  Service: Cardiovascular;  Laterality: N/A;   ANTERIOR CERVICAL DECOMP/DISCECTOMY FUSION  10-16-2009   C4 -- C6   ANTERIOR CERVICAL DECOMP/DISCECTOMY FUSION N/A 01/27/2017   Procedure: ANTERIOR CERVICAL DECOMPRESSION FUSION CERVICAL 3-4 WITH INSTRUMENTATION AND ALLOGRAFT;  Surgeon: Estill Bamberg, MD;  Location: [facility];  Service: Orthopedics;  Laterality: N/A;  ANTERIOR CERVICAL DECOMPRESSION FUSION CERVICAL 3-4 WITH INSTRUMENTATION AND ALLOGRAFT   CAPSULOTOMY Right 01/26/2013   Procedure: MINOR CAPSULOTOMY;  Surgeon: Vita Erm., MD;  Location[facility] OR;  Service: Ophthalmology;  Laterality: Right;   CARPAL TUNNEL RELEASE Left 01/02/2019   Procedure: LEFT CARPAL TUNNEL RELEASE;  Surgeon: Betha Loa, MD;  Location: [facility];  Service: Orthopedics;  Laterality: Left;   CARPAL TUNNEL RELEASE Left 09/23/2023   Procedure: LEFT CARPAL TUNNEL RELEASE;  Surgeon: Betha Loa, MD;  [facility];  Service: Orthopedics;  Laterality: Left;   CARPECTOMY Right 03/13/2014   Procedure: RIGHT  PROXIMAL ROW CARPECTOMY;  Surgeon: Tami Ribas, MD;  Location: [facility];  Service:  Orthopedics;  Laterality: Right;   CARPECTOMY Left 10/22/2015   Procedure: LEFT WRIST PROXIMAL ROW CARPECTOMY ;  Surgeon: Betha Loa, MD;  Location: [facility];  Service: Orthopedics;  Laterality: Left;   CARPOMETACARPAL ([facility]) FUSION OF THUMB Right 03/13/2014   Procedure: RIGHT FUSION OF THUMB CARPOMETACARPAL [facility]) JOINT;  Surgeon: Tami Ribas, MD;  Location: [facility];  Service: Orthopedics;  Laterality: Right;   CARPOMETACARPEL SUSPENSION PLASTY Left 04/04/2020   Procedure: LEFT THUMB TRAPECIECTOMY AND SUSPENSIONPLASTY;  Surgeon: Betha Loa, MD;  Location: [facility];  Service: Orthopedics;  Laterality: Left;   CATARACT EXTRACTION W/ INTRAOCULAR LENS  IMPLANT, BILATERAL  2009   COLONOSCOPY  08-31-2007   COLONOSCOPY     COLONOSCOPY N/A 03/06/2023   Procedure: COLONOSCOPY;  Surgeon: Mansouraty, Netty Starring., MD;  Location: [facility] ENDOSCOPY;  Service: Gastroenterology;  Laterality: N/A;   CRYOABLATION N/A 01/14/2015   Procedure: CRYO ABLATION PROSTATE;  Surgeon: Kathi Ludwig, MD;  Location: [facility];  Service: Urology;  Laterality: N/A;   ENTEROSCOPY N/A 03/06/2023   Procedure: ENTEROSCOPY;  Surgeon: Meridee Score Netty Starring., MD;  Location: [facility] ENDOSCOPY;  Service: Gastroenterology;  Laterality:  N/A;   ENTEROSCOPY N/A 04/09/2023   Procedure: ENTEROSCOPY;  Surgeon: Benancio Deeds, MD;  Location: [facility] ENDOSCOPY;  Service: Gastroenterology;  Laterality: N/A;   ESOPHAGOGASTRODUODENOSCOPY  last one 09-11-2008   ESOPHAGOGASTRODUODENOSCOPY (EGD) WITH PROPOFOL N/A 02/04/2023   Procedure: ESOPHAGOGASTRODUODENOSCOPY (EGD) WITH PROPOFOL;  Surgeon: Jenel Lucks, MD;  Location: [facility] ENDOSCOPY;  Service: Gastroenterology;  Laterality: N/A;   EXCISION MASS LEFT FACE , SUBORBITAL AREA, PLASTER RECONSTRUCTION  02-09-2011   EXCISIONAL DEBRIDEMENT AND REPAIR RIGHT QUADRICEP TENDON  07-05-2000   FOOT SURGERY Left    GIVENS CAPSULE STUDY N/A  04/09/2023   Procedure: GIVENS CAPSULE STUDY;  Surgeon: Benancio Deeds, MD;  Location: [facility] ENDOSCOPY;  Service: Gastroenterology;  Laterality: N/A;   HARDWARE REMOVAL Left 09/23/2023   Procedure: REMOVAL RETAINED HARDWARE LEFT HAND;  Surgeon: Betha Loa, MD;  [facility];  Service: Orthopedics;  Laterality: Left;   HOT HEMOSTASIS N/A 02/04/2023   Procedure: HOT HEMOSTASIS (ARGON PLASMA COAGULATION/BICAP);  Surgeon: Jenel Lucks, MD;  Location: [facility] ENDOSCOPY;  Service: Gastroenterology;  Laterality: N/A;   HOT HEMOSTASIS N/A 03/06/2023   Procedure: HOT HEMOSTASIS (ARGON PLASMA COAGULATION/BICAP);  Surgeon: Lemar Lofty., MD;  Location: [facility] ENDOSCOPY;  Service: Gastroenterology;  Laterality: N/A;   HOT HEMOSTASIS N/A 04/09/2023   Procedure: HOT HEMOSTASIS (ARGON PLASMA COAGULATION/BICAP);  Surgeon: Benancio Deeds, MD;  Location: [facility] ENDOSCOPY;  Service: Gastroenterology;  Laterality: N/A;   I & D EXTREMITY Right 05/24/2013   Procedure: RIGHT INDEX WOUND DEBRIDEMENT AND CLOSURE;  Surgeon: Jodi Marble, MD;  Location: [facility];  Service: Orthopedics;  Laterality: Right;   INSERTION OF SUPRAPUBIC CATHETER N/A 01/14/2015   Procedure: SUPRAPUBIC TUBE PLACEMENT;  Surgeon: Kathi Ludwig, MD;  Location: [facility];  Service: Urology;  Laterality: N/A;   KNEE ARTHROSCOPY Right 2008   LARYNGOSCOPY AND ESOPHAGOSCOPY  06-13-2010   MARSUPIALIZATION LEFT LARGE VALLECULAR CYST   MASS EXCISION Left 10/22/2015   Procedure: EXCISION MASS OF LEFT INDEX FINGER;  Surgeon: Betha Loa, MD;  Location: [facility];  Service: Orthopedics;  Laterality: Left;   ORCHIECTOMY Left 02/24/2015   Procedure: SCROTAL EXPLORATION WITH LEFT ORCHIECTOMY;  Surgeon: Ihor Gully, MD;  Location: [facility] OR;  Service: Urology;  Laterality: Left;   PERIPHERAL VASCULAR INTERVENTION Left 02/26/2021   Procedure: PERIPHERAL VASCULAR INTERVENTION;  Surgeon: Leonie Douglas, MD;  [facility];  Service: Cardiovascular;  Laterality: Left;  Superficial femoral   POLYPECTOMY  03/06/2023   Procedure: POLYPECTOMY;  Surgeon: Mansouraty, Netty Starring., MD;  Location: [facility] ENDOSCOPY;  Service: Gastroenterology;;   POSTERIOR CERVICAL FUSION/FORAMINOTOMY N/A 01/28/2017   Procedure: POSTERIOR SPINAL FUSION CERVICAL 3-4, CERVICAL 4-5, CERVICAL 5-6, CERVICAL 6-7 WITH INSTRUMENATION AND ALLOGRAFT;  Surgeon: Estill Bamberg, MD;  [facility];  Service: Orthopedics;  Laterality: N/A;  POSTERIOR SPINAL FUSION CERVICAL 3-4, CERVICAL 4-5, CERVICAL 5-6, CERVICAL 6-7 WITH INSTRUMENATION AND ALLOGRAFT   SHOULDER ARTHROSCOPY/ DEBRIDEMENT LABRAL TEAR/  BICEPS TENOTOMY Left 09-24-2011   SUBMUCOSAL TATTOO INJECTION  03/06/2023   Procedure: SUBMUCOSAL TATTOO INJECTION;  Surgeon: Lemar Lofty., MD;  Location: [facility]COPY;  Service: Gastroenterology;;   TONSILLECTOMY  as child   TRANSTHORACIC ECHOCARDIOGRAM  02-21-2010   normal LVF/  ef 55-60%/ mild to moderate MR/  moderate LAE/  mild TR   YAG LASER APPLICATION Right 01/26/2013   Procedure: YAG LASER APPLICATION;  Surgeon: Vita Erm., MD;  Location: [facility] OR;  Service: Ophthalmology;  Laterality: Right;  YAG LASER CAPSULOTOMY, LEFT EYE  01-08-2011    SOCIAL HISTORY: Social History   Socioeconomic History   Marital status: Married    Spouse name: Not on file   Number of children: 3   Years of education: 12   Highest education level: High school graduate  Occupational History   Occupation: Retired - poured concrete  Tobacco Use   Smoking status: Every Day    Current packs/day: 1.00    Average packs/day: 1 pack/day for 59.0 years (59.0 ttl pk-yrs)    Types: Cigarettes   Smokeless tobacco: Never   Tobacco comments:    Started Chantix 10/26/16  Vaping Use   Vaping status: Never Used  Substance and Sexual Activity   Alcohol use: No    Comment: none since approx 2014- heavy before   Drug use: No    Sexual activity: Not Currently  Other Topics Concern   Not on file  Social History Narrative   Lives with wife and 2 of his children in a one story home.  Has 3 children.  Retired Set designer.  Education: high school.   Right Handed   Social Determinants of Health   Financial Resource Strain: Not on File (04/09/2022)   [facility] [employer] [employer]    Financial Resource Strain: 0  Food Insecurity: Not on File (09/16/2023)   Received from [facility] Insecurity    Food: 0  Transportation Needs: No Transportation Needs (04/08/2023)   PRAPARE - Administrator, Civil Service (Medical): No    Lack of Transportation (Non-Medical): No  Physical Activity: Not on File (04/09/2022)   Received from [place], [place]   Physical Activity    Physical Activity: 0  Stress: Not on File (04/09/2022)   Received from [facility], [place]   Stress    Stress: 0  Social Connections: Not on File (09/04/2023)   Received from [facility]   Social Connections    Connectedness: 0  Intimate Partner Violence: Not At Risk (04/08/2023)   Humiliation, Afraid, Rape, and Kick questionnaire    Fear of Current or Ex-Partner: No    Emotionally Abused: No    Physically Abused: No    Sexually Abused: No    FAMILY HISTORY: Family History  Problem Relation Age of Onset   Unexplained death Mother        died at a young age, no known cause   Heart attack Father        age unknown   Cancer Sister        unknown   Diabetes Other    Cancer Other    Colon cancer Neg Hx    Stomach cancer Neg Hx    Esophageal cancer Neg Hx     ALLERGIES:  is allergic to ultram [tramadol].  MEDICATIONS:  Current Outpatient Medications  Medication Sig Dispense Refill   ALPHAGAN P 0.1 % SOLN Place 1 drop into the right eye 3 (three) times daily.     aspirin EC 81 MG tablet Take 81 mg by mouth daily. Swallow whole.     bimatoprost (LUMIGAN) 0.01 % SOLN Place 1 drop into both eyes at bedtime. 30 mL 3    feeding supplement (ENSURE ENLIVE / ENSURE PLUS) LIQD Take 237 mLs by mouth 3 (three) times daily between meals. 237 mL 12   FEROSUL 325 (65 Fe) MG tablet TAKE 1 TABLET BY MOUTH DAILY WITH BREAKFAST 30 tablet 0   Fish Oil-Cholecalciferol (FISH OIL +  D3 PO) Take 1 capsule by mouth daily.     meclizine (ANTIVERT) 25 MG tablet Take 25 mg by mouth 3 (three) times daily as needed for dizziness.     mirtazapine (REMERON) 15 MG tablet Take 15 mg by mouth at bedtime.     octreotide (SANDOSTATIN) 100 MCG/ML SOLN injection Inject 1 mL (100 mcg total) into the skin 3 (three) times daily. (Patient not taking: Reported on 09/14/2023) 90 mL 2   oxybutynin (DITROPAN-XL) 10 MG 24 hr tablet Take 1 tablet by mouth daily.     oxyCODONE (ROXICODONE) 5 MG immediate release tablet 1 tab PO q6 hours prn pain 20 tablet 0   polyethylene glycol (MIRALAX / GLYCOLAX) 17 g packet Take 17 g by mouth 2 (two) times daily. (Patient not taking: Reported on 09/14/2023) 60 packet 3   pravastatin (PRAVACHOL) 80 MG tablet Take 80 mg by mouth daily.  2   No current facility-administered medications for this visit.    REVIEW OF SYSTEMS:   Respiratory: Denies shortness of breath Cardiovascular: Denies chest pain, chest discomfort Gastrointestinal:  Denies nausea, abdominal pain, melena or bloody stools GU: no hematuria Skin: skin color change All other systems were reviewed with the patient and are negative.  PHYSICAL EXAMINATION: ECOG PERFORMANCE STATUS: {CHL ONC ECOG PS:[phone]}  There were no vitals filed for this visit. There were no vitals filed for this visit.  GENERAL: alert, no distress and comfortable SKIN: skin color normal EYES: normal conjunctiva, sclera clear LUNGS: normal breathing effort HEART: regular rate & rhythm ABDOMEN: abdomen soft, non-tender and nondistended  RADIOGRAPHIC STUDIES: I have personally reviewed the radiological images as listed and agreed with the findings in the report. No results  found.

## 2023-10-14 ENCOUNTER — Telehealth: Payer: Self-pay

## 2023-10-14 ENCOUNTER — Inpatient Hospital Stay: Payer: 59

## 2023-10-20 NOTE — Progress Notes (Deleted)
10/20/2023 Russell [name] [id number] 1941-08-04  Referring provider: Valerie Roys,* Primary GI doctor: Dr. 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  Close follow up 3 months  AVM (arteriovenous malformation) of small bowel, acquired with hemorrhage  Other constipation - Increase fiber/ water intake, decrease caffeine, increase activity level. -Will add on Miralax daily  Carpal tunnel Needs medical clearance for hand surgery with [facility], [phone] [address] and [phone].   Patient Care Team: Valerie Roys, FNP as PCP - General (Nurse Practitioner) Glendale Chard, DO as Consulting Physician (Neurology)  HISTORY OF PRESENT ILLNESS: 82 y.o. 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 and others listed below presents for evaluation of anemia.   03/04/2023 ER visit with melena, syncope hemoglobin 4.5, had admission month prior with endoscopy which found AVM status post APC. CT angio negative, did show descending sigmoid  diverticulosis.  04/12/2023  HGB 8.1 MCV 82.8 Platelets 282 01/22/2023 Iron 20 Ferritin 6 B12 1,304 03/10/2023 HGB 9, MCV 85, iron 38, saturation 10  He states he was on iron at one time but thinks he has run out of it, had constipation with it. 'He did have 2 doses of IV ferriecit in the hospital.  He is on protonix 40 mg twice a day.  He was sent in octreotride from the hospital but never got a refill of the medication, was on it 3 x a day.  He is suppose to have left hand surgery for carpal tunnel but can not have it until he has clearance from [place].  He denies melena or hematochezia.  No AB pain.  Denies anemia symptoms such as dyspnea, chest pain, weakness, dizziness, RLS.   Colonoscopy 08/2007: -Unremarkable. EGD 02/04/2023: - The examined portions of the nasopharynx, oropharynx and larynx were normal. - Normal esophagus. - Two non-bleeding angiodysplastic lesions in the stomach. Treated with argon plasma coagulation (APC). - Small hiatal hernia. - Hematin (altered blood/coffee-ground-like material) in the gastric body. - A single non-bleeding angiodysplastic lesion in the duodenum. Treated with argon plasma coagulation (APC). - No specimens collected. 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 It is possible that there may be more AVMs in the small intestine or colon.  Colonoscopy 03/06/2023: Hemorrhoids, stool throughout the exam fair prep even with copious lavage, diverticulosis, 2 polyps 3 to 14 mm in size nonbleeding nonthrombosed external/internal hemorrhoids repeat colonoscopy 1 year for screening purposes written down but with age, uncertain this is needed.  Small bowel endoscopy 04/09/23: Esophagogastric landmarks identified. - 2 cm hiatal hernia. - Normal esophagus otherwise. - Four  angiodysplastic lesions in the stomach - one with oozing and heme in the stomach. Treated with argon plasma coagulation (APC). - Three non-bleeding angiodysplastic lesions  in the duodenum. Treated with argon plasma coagulation (APC). - Two non-bleeding angiodysplastic lesions in the duodenum. Treated with argon plasma coagulation (APC). - A tattoo was seen in the jejunum. - Normal exam otherwise. Capsule study 04/08/2023 50 minutes after capsule injured small bowel bleeding AVM identified, distally further AVMs with some evidence of bleeding.  Distal small bowel significant blood, more proximal portions of small bowel.  No bleeding sites were found in distal small bowel.  Current AVMs felt too distal for push enteroscopy.  Hematology referral, iron supplementation, follow hemoglobin transfuse as needed, consider outpatient octreotide subcu.    He denies blood thinner use.  He denies NSAID use.  He denies ETOH use.   He reports tobacco use, 60 pack year smoking history.  He denies drug use.    He  reports that he has been smoking cigarettes. He has a 59 pack-year smoking history. He has never used smokeless tobacco. He reports that he does not drink alcohol and does not use drugs.  RELEVANT LABS AND IMAGING: CBC    Component Value Date/Time   WBC 9.3 09/23/2023 0716   RBC 4.11 (L) 09/23/2023 0716   HGB 10.5 (L) 09/23/2023 0716   HCT 34.8 (L) 09/23/2023 0716   PLT 304 09/23/2023 0716   MCV 84.7 09/23/2023 0716   MCH 25.5 (L) 09/23/2023 0716   MCHC 30.2 09/23/2023 0716   RDW 17.2 (H) 09/23/2023 0716   LYMPHSABS 1.3 08/03/2023 1044   MONOABS 0.7 08/03/2023 1044   EOSABS 0.1 08/03/2023 1044   BASOSABS 0.1 08/03/2023 1044   Recent Labs    03/06/23 0229 03/07/23 0315 04/08/23 1400 04/09/23 0735 04/10/23 0657 04/11/23 0355 04/11/23 2027 04/12/23 0642 08/03/23 1044 09/23/23 0716  HGB 8.1* 7.9* 6.2* 7.2* 7.6* 6.3* 7.7* 8.1* 11.0* 10.5*    CMP     Component Value Date/Time   NA 137 09/23/2023 0716   K 4.3 09/23/2023 0716   CL 103 09/23/2023 0716   CO2 20 (L) 09/23/2023 0716   GLUCOSE 94 09/23/2023 0716   BUN 36 (H) 09/23/2023 0716    CREATININE 2.39 (H) 09/23/2023 0716   CALCIUM 9.1 09/23/2023 0716   PROT 6.8 09/23/2023 0716   ALBUMIN 3.7 09/23/2023 0716   AST 20 09/23/2023 0716   ALT 18 09/23/2023 0716   ALKPHOS 56 09/23/2023 0716   BILITOT 0.2 (L) 09/23/2023 0716   GFRNONAA 26 (L) 09/23/2023 0716   GFRAA 51 (L) 04/01/2020 1302      Latest Ref Rng & Units 09/23/2023    7:16 AM 08/03/2023   10:44 AM 04/08/2023    2:00 PM  Hepatic Function  Total Protein 6.5 - 8.1 g/dL 6.8  7.5  7.1   Albumin 3.5 - 5.0 g/dL 3.7  4.6  4.2   AST 15 - 41 U/L 20  12  14    ALT 0 - 44 U/L 18  16  16    Alk Phosphatase 38 - 126 U/L 56  53  40   Total Bilirubin 0.3 - 1.2 mg/dL 0.2  0.3  0.1       Current Medications:   Current Outpatient Medications (Endocrine & Metabolic):    octreotide (SANDOSTATIN) 100 MCG/ML SOLN injection, Inject 1 mL (100 mcg total) into the skin 3 (three) times daily. (Patient not taking: Reported on 09/14/2023)  Current Outpatient Medications (Cardiovascular):  pravastatin (PRAVACHOL) 80 MG tablet, Take 80 mg by mouth daily.   Current Outpatient Medications (Analgesics):    aspirin EC 81 MG tablet, Take 81 mg by mouth daily. Swallow whole.   oxyCODONE (ROXICODONE) 5 MG immediate release tablet, 1 tab PO q6 hours prn pain  Current Outpatient Medications (Hematological):    FEROSUL 325 (65 Fe) MG tablet, TAKE 1 TABLET BY MOUTH DAILY WITH BREAKFAST  Current Outpatient Medications (Other):    ALPHAGAN P 0.1 % SOLN, Place 1 drop into the right eye 3 (three) times daily.   bimatoprost (LUMIGAN) 0.01 % SOLN, Place 1 drop into both eyes at bedtime.   feeding supplement (ENSURE ENLIVE / ENSURE PLUS) LIQD, Take 237 mLs by mouth 3 (three) times daily between meals.   Fish Oil-Cholecalciferol (FISH OIL + D3 PO), Take 1 capsule by mouth daily.   meclizine (ANTIVERT) 25 MG tablet, Take 25 mg by mouth 3 (three) times daily as needed for dizziness.   mirtazapine (REMERON) 15 MG tablet, Take 15 mg by mouth at  bedtime.   oxybutynin (DITROPAN-XL) 10 MG 24 hr tablet, Take 1 tablet by mouth daily.   polyethylene glycol (MIRALAX / GLYCOLAX) 17 g packet, Take 17 g by mouth 2 (two) times daily. 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   COPD (chronic obstructive pulmonary disease) (HCC)    Coronary atherosclerosis    denied knowledge of this 10/27/16   Depression    Diverticulosis of colon    DYSPHAGIA UNSPECIFIED 05/16/2009   Qualifier: Diagnosis of  By: Delrae Alfred MD, Elizabeth     Dyspnea    EARLY SATIETY 11/04/2007   Qualifier: Diagnosis of  By: Delrae Alfred MD, Lanora Manis     ED (erectile dysfunction)    Full dentures    GERD (gastroesophageal reflux disease)    not current   Glaucoma    History of gastritis    History of scabies    2008   History of seizure    2013-- x2   idiopathic --  none since   History of syncope    03-22-2014  DX  VAGAL RESPONSE   Hyperlipidemia, mixed    Hypertension    Mitral regurgitation    Peripheral arterial disease (HCC)    one vessel runoff bilaterally via peroneal arteries   Prostate cancer (HCC)    Seizures (HCC) 03/2014   ? or syncope- patient refused to be admitted for further studies- "felt better"   Type 2 diabetes, diet controlled (HCC)    no meds; patient and his wife said no- have not been told 01/21/17   Wears glasses    Allergies:  Allergies  Allergen Reactions   Ultram  [Tramadol] Other (See Comments)    Confusion      Surgical History:  He  has a past surgical history that includes Knee arthroscopy (Right, 2008); Capsulotomy (Right, 01/26/2013); Yag laser application (Right, 01/26/2013); I & D extremity (Right,  05/24/2013); Foot surgery (Left); Carpectomy (Right, 03/13/2014); Carpometacarpal (cmc) fusion of thumb (Right, 03/13/2014); Cataract extraction w/ intraocular lens  implant, bilateral (2009); YAG LASER CAPSULOTOMY, LEFT EYE (01-08-2011); Anterior cervical decomp/discectomy fusion (10-16-2009); EXCISIONAL DEBRIDEMENT AND REPAIR RIGHT QUADRICEP TENDON (07-05-2000); Laryngoscopy and esophagoscopy (06-13-2010); EXCISION MASS LEFT FACE , SUBORBITAL AREA, PLASTER RECONSTRUCTION (02-09-2011); SHOULDER ARTHROSCOPY/ DEBRIDEMENT LABRAL TEAR/  BICEPS TENOTOMY (Left, 09-24-2011); Colonoscopy (08-31-2007); Esophagogastroduodenoscopy (last one 09-11-2008); transthoracic echocardiogram (02-21-2010); Tonsillectomy (as child); Cryoablation (N/A, 01/14/2015); Insertion of suprapubic catheter (N/A, 01/14/2015); Orchiectomy (Left, 02/24/2015); Carpectomy (Left, 10/22/2015); Mass excision (Left, 10/22/2015); Colonoscopy; Anterior cervical decomp/discectomy fusion (N/A, 01/27/2017); Posterior cervical fusion/foraminotomy (N/A, 01/28/2017); Carpal tunnel release (Left, 01/02/2019); Carpometacarpel [facility]) suspension plasty (Left, 04/04/2020); ABDOMINAL AORTOGRAM W/LOWER EXTREMITY (Left, 12/18/2020); ABDOMINAL AORTOGRAM W/LOWER EXTREMITY (N/A, 02/26/2021); PERIPHERAL VASCULAR INTERVENTION (Left, 02/26/2021); Esophagogastroduodenoscopy (egd) with propofol (N/A, 02/04/2023); Hot hemostasis (N/A, 02/04/2023); enteroscopy (N/A, 03/06/2023); Colonoscopy (N/A, 03/06/2023); Hot hemostasis (N/A, 03/06/2023); Submucosal tattoo injection (03/06/2023); polypectomy (03/06/2023); enteroscopy (N/A, 04/09/2023); Hot hemostasis (N/A, 04/09/2023); Givens capsule study (N/A, 04/09/2023); Carpal tunnel release (Left, 09/23/2023); and Hardware  Removal (Left, 09/23/2023). Family History:  His family history includes Cancer in his sister and another family member; Diabetes in an other family member; Heart attack in his father; Unexplained death in his mother.  REVIEW OF SYSTEMS  : All other systems reviewed and negative except where noted in the History of Present Illness.  PHYSICAL EXAM: There were no vitals taken for this visit. General Appearance: thin, chronically ill appearing AA male, in no apparent distress. Head:   Normocephalic and atraumatic. Eyes:  sclerae anicteric,conjunctive pink  Respiratory: Respiratory effort normal, BS equal bilaterally without rales, rhonchi, wheezing. Cardio: RRR with no MRGs. Peripheral pulses intact.  Abdomen: Soft,  Flat ,active bowel sounds. No tenderness . Without guarding and Without rebound. No masses. Rectal: Not evaluated Musculoskeletal: Full ROM, Normal gait. Without edema. Skin:  Dry and intact without significant lesions or rashes Neuro: Alert and  oriented x4;  No focal deficits. Psych:  Cooperative. Normal mood and affect.    Doree Albee, PA-C 9:17 AM

## 2023-10-21 ENCOUNTER — Ambulatory Visit: Payer: 59 | Admitting: Physician Assistant

## 2023-10-26 ENCOUNTER — Other Ambulatory Visit: Payer: Self-pay | Admitting: Podiatry

## 2023-10-26 ENCOUNTER — Encounter: Payer: Self-pay | Admitting: Podiatry

## 2023-10-26 ENCOUNTER — Ambulatory Visit (INDEPENDENT_AMBULATORY_CARE_PROVIDER_SITE_OTHER): Payer: 59 | Admitting: Podiatry

## 2023-10-26 DIAGNOSIS — G5782 Other specified mononeuropathies of left lower limb: Secondary | ICD-10-CM

## 2023-10-26 MED ORDER — OXYCODONE-ACETAMINOPHEN 10-325 MG PO TABS: 1.0000 | ORAL_TABLET | Freq: Three times a day (TID) | ORAL | 0 refills | Status: AC | PRN

## 2023-10-26 NOTE — Progress Notes (Signed)
He presents today chief complaint of a painful neuroma third interspace of the left foot.  States that has not gotten any better had surgery on his hand recently.  Objective: Vital sign stable oriented x 3 pulses are not palpable.  He has cool feet but no open lesions or wounds.  Has pain on palpation to the third interspace of the left foot most likely stump neuroma.  Assessment: Stump neuroma left foot.  Plan: Injection for the neuroma today and I did send over Percocet for him.

## 2023-10-28 NOTE — Progress Notes (Deleted)
[facility] CONSULT NOTE  Patient Care Team: Valerie Roys, FNP as PCP - General (Nurse Practitioner) Glendale Chard, DO as Consulting Physician (Neurology)  ASSESSMENT & PLAN:  @AGE  male with history of GI bleeding, AVM, PVD, type 2 diabetes, CAD, hypertension being seen for iron deficiency anemia.  No problem-specific Assessment & Plan notes found for this encounter.  No orders of the defined types were placed in this encounter.   Relevant history: History of GIB and AVM Last colonoscopy: 2024 Last EGD: 2024  The mechanism of IDA is due to either blood loss or decreased absorptive mechanism or both. He tolerated IV iron. We discussed some of the risks, benefits, and alternatives of intravenous iron infusions. The patient is symptomatic from anemia and the iron level is critically low. He tolerated oral iron supplement poorly and desires to achieved higher levels of iron faster for adequate hematopoesis. Some of the side-effects to be expected including risks of infusion reactions, phlebitis, headaches, nausea and fatigue.  The patient is willing to proceed. Patient education material was dispensed.   CBC, Iron, TIBC, ferritin, iron saturation in ***   All questions were answered. The patient knows to call the clinic with any problems, questions or concerns.   Melven Sartorius, MD 11/7/20241:25 PM   CHIEF COMPLAINTS/PURPOSE OF CONSULTATION:  Anemia  HISTORY OF PRESENTING ILLNESS:  Russell [name] 82 y.o. male is here because of anemia.  Records showed microcytic anemia and iron deficiency earlier this year in 2024. This had improved as of October 2024.  Patient received 2 doses of Feraheme in February and Ferrlecit for total of 6 doses from March to April 2024.  Lab results show improvement of hemoglobin level and microcytosis.  Previous B12 and folate were normal.  02/04/23 EGD - The examined portions of the nasopharynx, oropharynx and larynx were  normal. - Normal esophagus. 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 Treated with argon plasma coagulation ( APC) . - Small hiatal hernia. - Hematin ( altered blood/ coffee- ground- like material) in the gastric body. - A single non- bleeding angiodysplastic lesion in the duodenum. Treated with argon plasma coagulation ( APC) . - No specimens collected. - The patient' s GI bleeding and anemia are secondary to bleeding from GI AVMs. It is possible that there may be more AVMs in the small intestine or colon.  03/06/23 colonoscopy - Hemorrhoids found on digital rectal exam. - Stool in the entire examined colon - even after copious lavage preparation is only deemed to be fair. - Diverticulosis in the entire examined colon. - Two 3 to 14 mm polyps at the recto- sigmoid colon and in the ascending colon, removed with a cold snare. Resected and retrieved. - Non- bleeding non- thrombosed external and internal hemorrhoids. 03/06/23 EGD: A 2 cm hiatal hernia was present. Four small angiodysplastic lesions were found in the cardia and in the gastric fundus. One of these had active oozing and with old heme in close approximation. Fulguration to stop the bleeding by argon plasma was successful. Three angiodysplastic lesions with no bleeding were found in the second portion of the duodenum and in the third portion of the duodenum. Fulguration to ablate the lesion to prevent bleeding by argon plasma was successful.  04/09/23 capsule endoscopy reported small bowel bleeding AVM 04/11/23 CT angio 1. No findings of intraluminal contrast extravasation to localize GI bleeding. 2. Advanced aortoiliac atherosclerotic disease.   NON-VASCULAR   1. No acute findings in  the abdomen or pelvis. 2. Sigmoid diverticulosis without active inflammatory changes.   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   COPD (chronic obstructive pulmonary disease) (HCC)    Coronary atherosclerosis    denied knowledge of this 10/27/16   Depression    Diverticulosis of colon    DYSPHAGIA UNSPECIFIED 05/16/2009   Qualifier: Diagnosis of  By: Delrae Alfred MD, Elizabeth     Dyspnea    EARLY SATIETY 11/04/2007   Qualifier: Diagnosis of  By: Delrae Alfred MD, Lanora Manis     ED (erectile dysfunction)    Full dentures    GERD (gastroesophageal reflux disease)    not current   Glaucoma    History of gastritis    History of scabies    2008   History of seizure    2013-- x2   idiopathic --  none since   History of syncope    03-22-2014  DX  VAGAL RESPONSE   Hyperlipidemia, mixed    Hypertension    Mitral regurgitation    Peripheral arterial disease (HCC)    one vessel runoff bilaterally via peroneal arteries   Prostate cancer (HCC)    Seizures (HCC) 03/2014   ? or syncope- patient refused to be admitted for further studies- "felt better"   Type 2 diabetes, diet controlled (HCC)    no meds; patient and his wife said no- have not been told 01/21/17   Wears glasses     SURGICAL HISTORY: Past Surgical History:  Procedure Laterality Date   ABDOMINAL AORTOGRAM W/LOWER EXTREMITY Left 12/18/2020   Procedure: ABDOMINAL AORTOGRAM W/LOWER EXTREMITY;  Surgeon: Leonie Douglas, MD;  [facility];  Service: Cardiovascular;  Laterality: Left;   ABDOMINAL AORTOGRAM W/LOWER EXTREMITY N/A 02/26/2021   Procedure: ABDOMINAL AORTOGRAM W/LOWER EXTREMITY;  Surgeon: Leonie Douglas, MD;  [facility];  Service: Cardiovascular;  Laterality: N/A;   ANTERIOR CERVICAL DECOMP/DISCECTOMY FUSION  10-16-2009   C4 -- C6   ANTERIOR CERVICAL DECOMP/DISCECTOMY FUSION N/A 01/27/2017   Procedure: ANTERIOR CERVICAL DECOMPRESSION FUSION CERVICAL 3-4 WITH INSTRUMENTATION AND ALLOGRAFT;  Surgeon: Estill Bamberg, MD;  Location: [facility];  Service: Orthopedics;  Laterality: N/A;  ANTERIOR CERVICAL DECOMPRESSION FUSION CERVICAL 3-4 WITH INSTRUMENTATION AND ALLOGRAFT   CAPSULOTOMY Right 01/26/2013   Procedure: MINOR CAPSULOTOMY;  Surgeon: Vita Erm., MD;  Location: [facility] OR;  Service: Ophthalmology;  Laterality: Right;   CARPAL TUNNEL RELEASE Left 01/02/2019   Procedure: LEFT CARPAL TUNNEL RELEASE;  Surgeon: Betha Loa, MD;  Location: [facility];  Service: Orthopedics;  Laterality: Left;   CARPAL TUNNEL RELEASE Left 09/23/2023   Procedure: LEFT CARPAL TUNNEL RELEASE;  Surgeon: Betha Loa, MD;  [facility]: [facility];  Service: Orthopedics;  Laterality: Left;   CARPECTOMY Right 03/13/2014   Procedure: RIGHT  PROXIMAL ROW CARPECTOMY;  Surgeon: Tami Ribas, MD;  Location:  SURGERY CENTER;  Service: Orthopedics;  Laterality: Right;   CARPECTOMY Left 10/22/2015  Procedure: LEFT WRIST PROXIMAL ROW CARPECTOMY ;  Surgeon: Betha Loa, MD;  Location: [facility];  Service: Orthopedics;  Laterality: Left;   CARPOMETACARPAL ([facility]) FUSION OF THUMB Right 03/13/2014   Procedure: RIGHT FUSION OF THUMB CARPOMETACARPAL [facility]) JOINT;  Surgeon: Tami Ribas, MD;  Location: [facility];  Service: Orthopedics;  Laterality: Right;   CARPOMETACARPEL SUSPENSION PLASTY Left 04/04/2020   Procedure: LEFT THUMB TRAPECIECTOMY AND SUSPENSIONPLASTY;  Surgeon: Betha Loa, MD;  Location: [facility];  Service: Orthopedics;  Laterality: Left;   CATARACT EXTRACTION W/ INTRAOCULAR LENS  IMPLANT, BILATERAL  2009   COLONOSCOPY  08-31-2007   COLONOSCOPY     COLONOSCOPY  N/A 03/06/2023   Procedure: COLONOSCOPY;  Surgeon: Mansouraty, Netty Starring., MD;  Location: [facility];  Service: Gastroenterology;  Laterality: N/A;   CRYOABLATION N/A 01/14/2015   Procedure: CRYO ABLATION PROSTATE;  Surgeon: Kathi Ludwig, MD;  Location: [facility];  Service: Urology;  Laterality: N/A;   ENTEROSCOPY N/A 03/06/2023   Procedure: ENTEROSCOPY;  Surgeon: Meridee Score Netty Starring., MD;  Location: [facility] ENDOSCOPY;  Service: Gastroenterology;  Laterality: N/A;   ENTEROSCOPY N/A 04/09/2023   Procedure: ENTEROSCOPY;  Surgeon: Benancio Deeds, MD;  [facility];  Service: Gastroenterology;  Laterality: N/A;   ESOPHAGOGASTRODUODENOSCOPY  last one 09-11-2008   ESOPHAGOGASTRODUODENOSCOPY (EGD) WITH PROPOFOL N/A 02/04/2023   Procedure: ESOPHAGOGASTRODUODENOSCOPY (EGD) WITH PROPOFOL;  Surgeon: Jenel Lucks, MD;  Location: [facility] ENDOSCOPY;  Service: Gastroenterology;  Laterality: N/A;   EXCISION MASS LEFT FACE , SUBORBITAL AREA, PLASTER RECONSTRUCTION  02-09-2011   EXCISIONAL DEBRIDEMENT AND REPAIR RIGHT QUADRICEP TENDON  07-05-2000   FOOT SURGERY Left    GIVENS CAPSULE STUDY N/A 04/09/2023   Procedure: GIVENS CAPSULE STUDY;  Surgeon: Benancio Deeds, MD;  Location: [facility] ENDOSCOPY;  Service: Gastroenterology;  Laterality: N/A;   HARDWARE REMOVAL Left 09/23/2023   Procedure: REMOVAL RETAINED HARDWARE LEFT HAND;  Surgeon: Betha Loa, MD;  [facility];  Service: Orthopedics;  Laterality: Left;   HOT HEMOSTASIS N/A 02/04/2023   Procedure: HOT HEMOSTASIS (ARGON PLASMA COAGULATION/BICAP);  Surgeon: Jenel Lucks, MD;  Location: [facility] ENDOSCOPY;  Service: Gastroenterology;  Laterality: N/A;   HOT HEMOSTASIS N/A 03/06/2023   Procedure: HOT HEMOSTASIS (ARGON PLASMA COAGULATION/BICAP);  Surgeon: Lemar Lofty., MD;  Location: [facility] ENDOSCOPY;  Service: Gastroenterology;  Laterality: N/A;   HOT HEMOSTASIS N/A 04/09/2023   Procedure: HOT HEMOSTASIS (ARGON  PLASMA COAGULATION/BICAP);  Surgeon: Benancio Deeds, MD;  Location: [facility] ENDOSCOPY;  Service: Gastroenterology;  Laterality: N/A;   I & D EXTREMITY Right 05/24/2013   Procedure: RIGHT INDEX WOUND DEBRIDEMENT AND CLOSURE;  Surgeon: Jodi Marble, MD;  Location: [facility];  Service: Orthopedics;  Laterality: Right;   INSERTION OF SUPRAPUBIC CATHETER N/A 01/14/2015   Procedure: SUPRAPUBIC TUBE PLACEMENT;  Surgeon: Kathi Ludwig, MD;  Location: [facility];  Service: Urology;  Laterality: N/A;   KNEE ARTHROSCOPY Right 2008   LARYNGOSCOPY AND ESOPHAGOSCOPY  06-13-2010   MARSUPIALIZATION LEFT LARGE VALLECULAR CYST   MASS EXCISION Left 10/22/2015   Procedure: EXCISION MASS OF LEFT INDEX FINGER;  Surgeon: Betha Loa, MD;  Location: [facility];  Service: Orthopedics;  Laterality: Left;   ORCHIECTOMY Left 02/24/2015   Procedure: SCROTAL EXPLORATION WITH LEFT ORCHIECTOMY;  Surgeon: Ihor Gully, MD;  Location: [facility] OR;  Service: Urology;  Laterality: Left;   PERIPHERAL VASCULAR INTERVENTION Left 02/26/2021   Procedure: PERIPHERAL VASCULAR INTERVENTION;  Surgeon: Lenell Antu,  Rande Brunt, MD;  [facility];  Service: Cardiovascular;  Laterality: Left;  Superficial femoral   POLYPECTOMY  03/06/2023   Procedure: POLYPECTOMY;  Surgeon: Mansouraty, Netty Starring., MD;  Location: [facility] ENDOSCOPY;  Service: Gastroenterology;;   POSTERIOR CERVICAL FUSION/FORAMINOTOMY N/A 01/28/2017   Procedure: POSTERIOR SPINAL FUSION CERVICAL 3-4, CERVICAL 4-5, CERVICAL 5-6, CERVICAL 6-7 WITH INSTRUMENATION AND ALLOGRAFT;  Surgeon: Estill Bamberg, MD;  Location[facility];  Service: Orthopedics;  Laterality: N/A;  POSTERIOR SPINAL FUSION CERVICAL 3-4, CERVICAL 4-5, CERVICAL 5-6, CERVICAL 6-7 WITH INSTRUMENATION AND ALLOGRAFT   SHOULDER ARTHROSCOPY/ DEBRIDEMENT LABRAL TEAR/  BICEPS TENOTOMY Left 09-24-2011   SUBMUCOSAL TATTOO INJECTION  03/06/2023   Procedure: SUBMUCOSAL TATTOO INJECTION;   Surgeon: Lemar Lofty., MD;  Location: [facility] ENDOSCOPY;  Service: Gastroenterology;;   TONSILLECTOMY  as child   TRANSTHORACIC ECHOCARDIOGRAM  02-21-2010   normal LVF/  ef 55-60%/ mild to moderate MR/  moderate LAE/  mild TR   YAG LASER APPLICATION Right 01/26/2013   Procedure: YAG LASER APPLICATION;  Surgeon: Vita Erm., MD;  Location: [facility] OR;  Service: Ophthalmology;  Laterality: Right;   YAG LASER CAPSULOTOMY, LEFT EYE  01-08-2011    SOCIAL HISTORY: Social History   Socioeconomic History   Marital status: Married    Spouse name: Not on file   Number of children: 3   Years of education: 12   Highest education level: High school graduate  Occupational History   Occupation: Retired - poured concrete  Tobacco Use   Smoking status: Every Day    Current packs/day: 1.00    Average packs/day: 1 pack/day for 59.0 years (59.0 ttl pk-yrs)    Types: Cigarettes   Smokeless tobacco: Never   Tobacco comments:    Started Chantix 10/26/16  Vaping Use   Vaping status: Never Used  Substance and Sexual Activity   Alcohol use: No    Comment: none since approx 2014- heavy before   Drug use: No   Sexual activity: Not Currently  Other Topics Concern   Not on file  Social History Narrative   Lives with wife and 2 of his children in a one story home.  Has 3 children.  Retired Set designer.  Education: high school.   Right Handed   Social Determinants of Health   Financial Resource Strain: Not on File (04/09/2022)   [facility]   [facility] [employer]    Financial Resource Strain: 0  Food Insecurity: Not on File (09/16/2023)   Received from [facility] Insecurity    Food: 0  Transportation Needs: No Transportation Needs (04/08/2023)   PRAPARE - Administrator, Civil Service (Medical): No    Lack of Transportation (Non-Medical): No  Physical Activity: Not on File (04/09/2022)   Received from [place], [place]   Physical Activity    Physical  Activity: 0  Stress: Not on File (04/09/2022)   Received from [facility], [place]   Stress    Stress: 0  Social Connections: Not on File (09/04/2023)   Received from [facility]   Social Connections    Connectedness: 0  Intimate Partner Violence: Not At Risk (04/08/2023)   Humiliation, Afraid, Rape, and Kick questionnaire    Fear of Current or Ex-Partner: No    Emotionally Abused: No    Physically Abused: No    Sexually Abused: No    FAMILY HISTORY: Family History  Problem Relation Age of Onset   Unexplained death Mother  died at a young age, no known cause   Heart attack Father        age unknown   Cancer Sister        unknown   Diabetes Other    Cancer Other    Colon cancer Neg Hx    Stomach cancer Neg Hx    Esophageal cancer Neg Hx     ALLERGIES:  is allergic to ultram [tramadol].  MEDICATIONS:  Current Outpatient Medications  Medication Sig Dispense Refill   ALPHAGAN P 0.1 % SOLN Place 1 drop into the right eye 3 (three) times daily.     aspirin EC 81 MG tablet Take 81 mg by mouth daily. Swallow whole.     bimatoprost (LUMIGAN) 0.01 % SOLN Place 1 drop into both eyes at bedtime. 30 mL 3   feeding supplement (ENSURE ENLIVE / ENSURE PLUS) LIQD Take 237 mLs by mouth 3 (three) times daily between meals. 237 mL 12   FEROSUL 325 (65 Fe) MG tablet TAKE 1 TABLET BY MOUTH DAILY WITH BREAKFAST 30 tablet 0   Fish Oil-Cholecalciferol (FISH OIL + D3 PO) Take 1 capsule by mouth daily.     HYDROcodone-acetaminophen (NORCO/VICODIN) 5-325 MG tablet Take 1 tablet by mouth every 6 (six) hours as needed.     iron polysaccharides (NIFEREX) 150 MG capsule Take 150 mg by mouth daily.     ketorolac (ACULAR) 0.4 % SOLN Apply to eye.     lidocaine (XYLOCAINE) 2 % solution SMARTSIG:By Mouth     meclizine (ANTIVERT) 25 MG tablet Take 25 mg by mouth 3 (three) times daily as needed for dizziness.     mirtazapine (REMERON) 15 MG tablet Take 15 mg by mouth at bedtime.     octreotide (SANDOSTATIN) 100  MCG/ML SOLN injection Inject 1 mL (100 mcg total) into the skin 3 (three) times daily. (Patient not taking: Reported on 09/14/2023) 90 mL 2   oxybutynin (DITROPAN-XL) 10 MG 24 hr tablet Take 1 tablet by mouth daily.     oxyCODONE (ROXICODONE) 5 MG immediate release tablet 1 tab PO q6 hours prn pain 20 tablet 0   oxyCODONE-acetaminophen (PERCOCET) 10-325 MG tablet Take 1 tablet by mouth every 8 (eight) hours as needed for up to 7 days for pain. 21 tablet 0   polyethylene glycol (MIRALAX / GLYCOLAX) 17 g packet Take 17 g by mouth 2 (two) times daily. (Patient not taking: Reported on 09/14/2023) 60 packet 3   pravastatin (PRAVACHOL) 80 MG tablet Take 80 mg by mouth daily.  2   silodosin (RAPAFLO) 4 MG CAPS capsule Take 4 mg by mouth daily.     No current facility-administered medications for this visit.    REVIEW OF SYSTEMS:   Respiratory: Denies shortness of breath Cardiovascular: Denies chest pain, chest discomfort Gastrointestinal:  Denies nausea, abdominal pain, melena or bloody stools GU: no hematuria Skin: skin color change All other systems were reviewed with the patient and are negative.  PHYSICAL EXAMINATION: ECOG PERFORMANCE STATUS: {CHL ONC ECOG PS:[phone]}  There were no vitals filed for this visit. There were no vitals filed for this visit.  GENERAL: alert, no distress and comfortable SKIN: skin color normal EYES: normal conjunctiva, sclera clear LUNGS: normal breathing effort HEART: regular rate & rhythm ABDOMEN: abdomen soft, non-tender and nondistended  RADIOGRAPHIC STUDIES: I have personally reviewed the radiological images as listed and agreed with the findings in the report. No results found.

## 2023-10-29 ENCOUNTER — Inpatient Hospital Stay: Payer: 59

## 2023-11-25 ENCOUNTER — Encounter: Payer: Self-pay | Admitting: Oncology

## 2023-11-25 ENCOUNTER — Inpatient Hospital Stay: Payer: 59 | Attending: Oncology | Admitting: Oncology

## 2023-11-25 ENCOUNTER — Telehealth: Payer: Self-pay

## 2023-11-25 ENCOUNTER — Inpatient Hospital Stay: Payer: 59

## 2023-11-25 ENCOUNTER — Other Ambulatory Visit: Payer: Self-pay | Admitting: Oncology

## 2023-11-25 VITALS — BP 102/50 | HR 68 | Temp 97.8°F | Resp 18 | Wt 136.1 lb

## 2023-11-25 DIAGNOSIS — K5900 Constipation, unspecified: Secondary | ICD-10-CM | POA: Diagnosis not present

## 2023-11-25 DIAGNOSIS — Z8719 Personal history of other diseases of the digestive system: Secondary | ICD-10-CM | POA: Insufficient documentation

## 2023-11-25 DIAGNOSIS — Z79899 Other long term (current) drug therapy: Secondary | ICD-10-CM | POA: Insufficient documentation

## 2023-11-25 DIAGNOSIS — K5520 Angiodysplasia of colon without hemorrhage: Secondary | ICD-10-CM

## 2023-11-25 DIAGNOSIS — N1832 Chronic kidney disease, stage 3b: Secondary | ICD-10-CM | POA: Insufficient documentation

## 2023-11-25 DIAGNOSIS — Z9089 Acquired absence of other organs: Secondary | ICD-10-CM | POA: Insufficient documentation

## 2023-11-25 DIAGNOSIS — Z87440 Personal history of urinary (tract) infections: Secondary | ICD-10-CM | POA: Insufficient documentation

## 2023-11-25 DIAGNOSIS — Z8249 Family history of ischemic heart disease and other diseases of the circulatory system: Secondary | ICD-10-CM | POA: Insufficient documentation

## 2023-11-25 DIAGNOSIS — E785 Hyperlipidemia, unspecified: Secondary | ICD-10-CM | POA: Diagnosis not present

## 2023-11-25 DIAGNOSIS — D631 Anemia in chronic kidney disease: Secondary | ICD-10-CM

## 2023-11-25 DIAGNOSIS — H409 Unspecified glaucoma: Secondary | ICD-10-CM | POA: Diagnosis not present

## 2023-11-25 DIAGNOSIS — Z885 Allergy status to narcotic agent status: Secondary | ICD-10-CM | POA: Insufficient documentation

## 2023-11-25 DIAGNOSIS — G5600 Carpal tunnel syndrome, unspecified upper limb: Secondary | ICD-10-CM | POA: Diagnosis not present

## 2023-11-25 DIAGNOSIS — K5521 Angiodysplasia of colon with hemorrhage: Secondary | ICD-10-CM | POA: Insufficient documentation

## 2023-11-25 DIAGNOSIS — R58 Hemorrhage, not elsewhere classified: Secondary | ICD-10-CM | POA: Insufficient documentation

## 2023-11-25 DIAGNOSIS — D500 Iron deficiency anemia secondary to blood loss (chronic): Secondary | ICD-10-CM

## 2023-11-25 DIAGNOSIS — E1122 Type 2 diabetes mellitus with diabetic chronic kidney disease: Secondary | ICD-10-CM | POA: Insufficient documentation

## 2023-11-25 DIAGNOSIS — I739 Peripheral vascular disease, unspecified: Secondary | ICD-10-CM | POA: Diagnosis not present

## 2023-11-25 DIAGNOSIS — Z809 Family history of malignant neoplasm, unspecified: Secondary | ICD-10-CM | POA: Insufficient documentation

## 2023-11-25 DIAGNOSIS — K219 Gastro-esophageal reflux disease without esophagitis: Secondary | ICD-10-CM | POA: Insufficient documentation

## 2023-11-25 DIAGNOSIS — R630 Anorexia: Secondary | ICD-10-CM | POA: Diagnosis not present

## 2023-11-25 DIAGNOSIS — Z9079 Acquired absence of other genital organ(s): Secondary | ICD-10-CM | POA: Insufficient documentation

## 2023-11-25 DIAGNOSIS — J449 Chronic obstructive pulmonary disease, unspecified: Secondary | ICD-10-CM | POA: Diagnosis not present

## 2023-11-25 DIAGNOSIS — N400 Enlarged prostate without lower urinary tract symptoms: Secondary | ICD-10-CM | POA: Diagnosis not present

## 2023-11-25 DIAGNOSIS — Z833 Family history of diabetes mellitus: Secondary | ICD-10-CM | POA: Insufficient documentation

## 2023-11-25 DIAGNOSIS — F17210 Nicotine dependence, cigarettes, uncomplicated: Secondary | ICD-10-CM | POA: Diagnosis not present

## 2023-11-25 DIAGNOSIS — I509 Heart failure, unspecified: Secondary | ICD-10-CM | POA: Diagnosis not present

## 2023-11-25 DIAGNOSIS — R634 Abnormal weight loss: Secondary | ICD-10-CM | POA: Insufficient documentation

## 2023-11-25 DIAGNOSIS — I130 Hypertensive heart and chronic kidney disease with heart failure and stage 1 through stage 4 chronic kidney disease, or unspecified chronic kidney disease: Secondary | ICD-10-CM | POA: Insufficient documentation

## 2023-11-25 LAB — IRON AND IRON BINDING CAPACITY (CC-WL,HP ONLY)
Iron: 47 ug/dL (ref 45–182)
Saturation Ratios: 9 % — ABNORMAL LOW (ref 17.9–39.5)
TIBC: 503 ug/dL — ABNORMAL HIGH (ref 250–450)
UIBC: 456 ug/dL — ABNORMAL HIGH (ref 117–376)

## 2023-11-25 LAB — COMPREHENSIVE METABOLIC PANEL
ALT: 10 U/L (ref 0–44)
AST: 10 U/L — ABNORMAL LOW (ref 15–41)
Albumin: 4.5 g/dL (ref 3.5–5.0)
Alkaline Phosphatase: 60 U/L (ref 38–126)
Anion gap: 8 (ref 5–15)
BUN: 26 mg/dL — ABNORMAL HIGH (ref 8–23)
CO2: 25 mmol/L (ref 22–32)
Calcium: 10 mg/dL (ref 8.9–10.3)
Chloride: 107 mmol/L (ref 98–111)
Creatinine, Ser: 1.57 mg/dL — ABNORMAL HIGH (ref 0.61–1.24)
GFR, Estimated: 43 mL/min — ABNORMAL LOW (ref >60)
Glucose, Bld: 105 mg/dL — ABNORMAL HIGH (ref 70–99)
Potassium: 4 mmol/L (ref 3.5–5.1)
Sodium: 140 mmol/L (ref 135–145)
Total Bilirubin: 0.3 mg/dL (ref <1.2)
Total Protein: 7.5 g/dL (ref 6.5–8.1)

## 2023-11-25 LAB — CBC WITH DIFFERENTIAL/PLATELET
Abs Immature Granulocytes: 0.03 10*3/uL (ref 0.00–0.07)
Basophils Absolute: 0.1 10*3/uL (ref 0.0–0.1)
Basophils Relative: 1 %
Eosinophils Absolute: 0.2 10*3/uL (ref 0.0–0.5)
Eosinophils Relative: 2 %
HCT: 35 % — ABNORMAL LOW (ref 39.0–52.0)
Hemoglobin: 11.2 g/dL — ABNORMAL LOW (ref 13.0–17.0)
Immature Granulocytes: 0 %
Lymphocytes Relative: 12 %
Lymphs Abs: 1.2 10*3/uL (ref 0.7–4.0)
MCH: 26.7 pg (ref 26.0–34.0)
MCHC: 32 g/dL (ref 30.0–36.0)
MCV: 83.3 fL (ref 80.0–100.0)
Monocytes Absolute: 0.6 10*3/uL (ref 0.1–1.0)
Monocytes Relative: 7 %
Neutro Abs: 7.5 10*3/uL (ref 1.7–7.7)
Neutrophils Relative %: 78 %
Platelets: 308 10*3/uL (ref 150–400)
RBC: 4.2 MIL/uL — ABNORMAL LOW (ref 4.22–5.81)
RDW: 18.1 % — ABNORMAL HIGH (ref 11.5–15.5)
WBC: 9.6 10*3/uL (ref 4.0–10.5)
nRBC: 0 % (ref 0.0–0.2)

## 2023-11-25 LAB — LACTATE DEHYDROGENASE: LDH: 161 U/L (ref 98–192)

## 2023-11-25 LAB — VITAMIN B12: Vitamin B-12: 762 pg/mL (ref 180–914)

## 2023-11-25 LAB — FOLATE: Folate: 28.1 ng/mL (ref >5.9)

## 2023-11-25 LAB — FERRITIN: Ferritin: 20 ng/mL — ABNORMAL LOW (ref 24–336)

## 2023-11-25 NOTE — Telephone Encounter (Signed)
Dr. Arlana Pouch, patient will be scheduled as soon as possible.  Auth Submission: NO AUTH NEEDED Site of care: Site of care: [facility] Payer: [employer]  Medication & CPT/J Code(s) submitted: Feraheme (ferumoxytol) [id number] Route of submission (phone, fax, portal):  Phone # Fax # Auth type: Buy/Bill PB Units/visits requested: 510mg  x 2 doses Reference number:  Approval from: 11/25/23 to 12/21/23

## 2023-11-25 NOTE — Assessment & Plan Note (Signed)
-   Most likely culprit for his iron deficiency anemia.  Continue close follow-up with gastroenterology department.

## 2023-11-25 NOTE — Assessment & Plan Note (Signed)
-   Anemia seems to be multifactorial in his case from iron deficiency and also anemia of renal disease.  We will check SPEP, IFE, serum free light chains to rule out any underlying monoclonal gammopathy, although clinically this seems to be less likely.

## 2023-11-25 NOTE — Progress Notes (Signed)
[facility]  HEMATOLOGY CLINIC CONSULTATION NOTE    PATIENT NAME: Russell [name]   MR#: [id number] DOB: 08/19/1941  DATE OF SERVICE: 11/25/2023  Patient Care Team: Valerie Roys, FNP as PCP - General (Nurse Practitioner) Glendale Chard, DO as Consulting Physician (Neurology) Donato Schultz, FNP (Family Medicine)  REASON FOR CONSULTATION/ CHIEF COMPLAINT:  Evaluation of anemia.  HISTORY OF PRESENT ILLNESS:  Russell [name] is a 82 y.o. 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  Discussed the use of AI scribe software for clinical note transcription with the patient, who gave verbal consent to proceed.   Patient has had chronic anemia, presumed to be from blood loss from small bowel AVMs.  His hemoglobin was as low as 4.5 in March 2024 when he was hospitalized.  He has been following up with gastroenterology closely.  His hemoglobin was 10.5, MCV 84.7 on 09/23/2023.  Given persistent anemia, referral was sent to [place] for further evaluation and management.  He reports a sensation of internal bleeding in his stomach, although he has not observed any overt blood loss. He notes a change in the color of his urine, which has turned black. He denies any associated urinary symptoms such as burning micturition or fever.  The patient also reports a loss of appetite and unintentional weight loss. He describes a decrease in his food intake, often only managing two or three bites of food before feeling full. He denies any chest pain or difficulty breathing, and reports no other bleeding such as nosebleeds  or gum bleeds. He is not taking any over-the-counter pain medications such as ibuprofen, Aleve, or Motrin. He has not received a blood transfusion since his last hospital admission.  The patient was prescribed oral iron supplements and he takes it once daily.  Does report some constipation with iron supplements.  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   COPD (chronic obstructive pulmonary disease) (HCC)    Coronary atherosclerosis    denied knowledge of this 10/27/16   Depression    Diverticulosis of colon    DYSPHAGIA UNSPECIFIED 05/16/2009   Qualifier: Diagnosis of  By: Delrae Alfred MD, Elizabeth     Dyspnea    EARLY SATIETY 11/04/2007   Qualifier: Diagnosis of  By: Delrae Alfred MD, Lanora Manis     ED (erectile dysfunction)    Full dentures    GERD (gastroesophageal reflux disease)    not current   Glaucoma    History of gastritis    History of scabies    2008   History of seizure    2013-- x2   idiopathic --  none since   History  of syncope    03-22-2014  DX  VAGAL RESPONSE   Hyperlipidemia, mixed    Hypertension    Mitral regurgitation    Peripheral arterial disease (HCC)    one vessel runoff bilaterally via peroneal arteries   Prostate cancer (HCC)    Seizures (HCC) 03/2014   ? or syncope- patient refused to be admitted for further studies- "felt better"   Type 2 diabetes, diet controlled (HCC)    no meds; patient and his wife said no- have not been  told 01/21/17   Wears glasses     SURGICAL HISTORY: Past Surgical History:  Procedure Laterality Date   ABDOMINAL AORTOGRAM W/LOWER EXTREMITY Left 12/18/2020   Procedure: ABDOMINAL AORTOGRAM W/LOWER EXTREMITY;  Surgeon: Leonie Douglas, MD;  [facility];  Service: Cardiovascular;  Laterality: Left;   ABDOMINAL AORTOGRAM W/LOWER EXTREMITY N/A 02/26/2021   Procedure: ABDOMINAL AORTOGRAM W/LOWER EXTREMITY;  Surgeon: Leonie Douglas, MD;  [facility];  Service: Cardiovascular;  Laterality: N/A;   ANTERIOR CERVICAL DECOMP/DISCECTOMY FUSION  10-16-2009   C4 -- C6   ANTERIOR CERVICAL DECOMP/DISCECTOMY FUSION N/A 01/27/2017   Procedure: ANTERIOR CERVICAL DECOMPRESSION FUSION CERVICAL 3-4 WITH INSTRUMENTATION AND ALLOGRAFT;  Surgeon: Estill Bamberg, MD;  [facility];  Service: Orthopedics;  Laterality: N/A;  ANTERIOR CERVICAL DECOMPRESSION FUSION CERVICAL 3-4 WITH INSTRUMENTATION AND ALLOGRAFT   CAPSULOTOMY Right 01/26/2013   Procedure: MINOR CAPSULOTOMY;  Surgeon: Vita Erm., MD;  Location: [facility] OR;  Service: Ophthalmology;  Laterality: Right;   CARPAL TUNNEL RELEASE Left 01/02/2019   Procedure: LEFT CARPAL TUNNEL RELEASE;  Surgeon: Betha Loa, MD;  Location: [facility];  Service: Orthopedics;  Laterality: Left;   CARPAL TUNNEL RELEASE Left 09/23/2023   Procedure: LEFT CARPAL TUNNEL RELEASE;  Surgeon: Betha Loa, MD;  [facility];  Service: Orthopedics;  Laterality: Left;   CARPECTOMY Right 03/13/2014   Procedure: RIGHT  PROXIMAL ROW CARPECTOMY;  Surgeon: Tami Ribas, MD;  Location: [facility];  Service: Orthopedics;  Laterality: Right;   CARPECTOMY Left 10/22/2015   Procedure: LEFT WRIST PROXIMAL ROW CARPECTOMY ;  Surgeon: Betha Loa, MD;  Location: [facility];  Service: Orthopedics;  Laterality: Left;   CARPOMETACARPAL (CMC) FUSION OF THUMB Right 03/13/2014   Procedure: RIGHT FUSION OF THUMB  CARPOMETACARPAL [facility]) JOINT;  Surgeon: Tami Ribas, MD;  Location: [facility];  Service: Orthopedics;  Laterality: Right;   CARPOMETACARPEL SUSPENSION PLASTY Left 04/04/2020   Procedure: LEFT THUMB TRAPECIECTOMY AND SUSPENSIONPLASTY;  Surgeon: Betha Loa, MD;  Location: [facility];  Service: Orthopedics;  Laterality: Left;   CATARACT EXTRACTION W/ INTRAOCULAR LENS  IMPLANT, BILATERAL  2009   COLONOSCOPY  08-31-2007   COLONOSCOPY     COLONOSCOPY N/A 03/06/2023   Procedure: COLONOSCOPY;  Surgeon: Mansouraty, Netty Starring., MD;  Location[facility];  Service: Gastroenterology;  Laterality: N/A;   CRYOABLATION N/A 01/14/2015   Procedure: CRYO ABLATION PROSTATE;  Surgeon: Kathi Ludwig, MD;  Location: [facility];  Service: Urology;  Laterality: N/A;   ENTEROSCOPY N/A 03/06/2023   Procedure: ENTEROSCOPY;  Surgeon: Meridee Score Netty Starring., MD;  Location: [facility];  Service: Gastroenterology;  Laterality: N/A;   ENTEROSCOPY N/A 04/09/2023   Procedure: ENTEROSCOPY;  Surgeon: Benancio Deeds, MD;  Location: [facility] ENDOSCOPY;  Service: Gastroenterology;  Laterality: N/A;   ESOPHAGOGASTRODUODENOSCOPY  last one 09-11-2008   ESOPHAGOGASTRODUODENOSCOPY (EGD) WITH PROPOFOL N/A 02/04/2023   Procedure: ESOPHAGOGASTRODUODENOSCOPY (EGD)  WITH PROPOFOL;  Surgeon: Jenel Lucks, MD;  Location: [facility] ENDOSCOPY;  Service: Gastroenterology;  Laterality: N/A;   EXCISION MASS LEFT FACE , SUBORBITAL AREA, PLASTER RECONSTRUCTION  02-09-2011   EXCISIONAL DEBRIDEMENT AND REPAIR RIGHT QUADRICEP TENDON  07-05-2000   FOOT SURGERY Left    GIVENS CAPSULE STUDY N/A 04/09/2023   Procedure: GIVENS CAPSULE STUDY;  Surgeon: Benancio Deeds, MD;  Location: [facility] ENDOSCOPY;  Service: Gastroenterology;  Laterality: N/A;   HARDWARE REMOVAL Left 09/23/2023   Procedure: REMOVAL RETAINED HARDWARE LEFT HAND;  Surgeon: Betha Loa, MD;  [facility];  Service: Orthopedics;   Laterality: Left;   HOT HEMOSTASIS N/A 02/04/2023   Procedure: HOT HEMOSTASIS (ARGON PLASMA COAGULATION/BICAP);  Surgeon: Jenel Lucks, MD;  Location: [facility] ENDOSCOPY;  Service: Gastroenterology;  Laterality: N/A;   HOT HEMOSTASIS N/A 03/06/2023   Procedure: HOT HEMOSTASIS (ARGON PLASMA COAGULATION/BICAP);  Surgeon: Lemar Lofty., MD;  Location: [facility]COPY;  Service: Gastroenterology;  Laterality: N/A;   HOT HEMOSTASIS N/A 04/09/2023   Procedure: HOT HEMOSTASIS (ARGON PLASMA COAGULATION/BICAP);  Surgeon: Benancio Deeds, MD;  Location: [facility] ENDOSCOPY;  Service: Gastroenterology;  Laterality: N/A;   I & D EXTREMITY Right 05/24/2013   Procedure: RIGHT INDEX WOUND DEBRIDEMENT AND CLOSURE;  Surgeon: Jodi Marble, MD;  Location: [facility];  Service: Orthopedics;  Laterality: Right;   INSERTION OF SUPRAPUBIC CATHETER N/A 01/14/2015   Procedure: SUPRAPUBIC TUBE PLACEMENT;  Surgeon: Kathi Ludwig, MD;  Location: [facility];  Service: Urology;  Laterality: N/A;   KNEE ARTHROSCOPY Right 2008   LARYNGOSCOPY AND ESOPHAGOSCOPY  06-13-2010   MARSUPIALIZATION LEFT LARGE VALLECULAR CYST   MASS EXCISION Left 10/22/2015   Procedure: EXCISION MASS OF LEFT INDEX FINGER;  Surgeon: Betha Loa, MD;  Location: [facility];  Service: Orthopedics;  Laterality: Left;   ORCHIECTOMY Left 02/24/2015   Procedure: SCROTAL EXPLORATION WITH LEFT ORCHIECTOMY;  Surgeon: Ihor Gully, MD;  Location: [facility] OR;  Service: Urology;  Laterality: Left;   PERIPHERAL VASCULAR INTERVENTION Left 02/26/2021   Procedure: PERIPHERAL VASCULAR INTERVENTION;  Surgeon: Leonie Douglas, MD;  [facility];  Service: Cardiovascular;  Laterality: Left;  Superficial femoral   POLYPECTOMY  03/06/2023   Procedure: POLYPECTOMY;  Surgeon: Mansouraty, Netty Starring., MD;  Location: [facility] ENDOSCOPY;  Service: Gastroenterology;;   POSTERIOR CERVICAL FUSION/FORAMINOTOMY N/A 01/28/2017    Procedure: POSTERIOR SPINAL FUSION CERVICAL 3-4, CERVICAL 4-5, CERVICAL 5-6, CERVICAL 6-7 WITH INSTRUMENATION AND ALLOGRAFT;  Surgeon: Estill Bamberg, MD;  [facility];  Service: Orthopedics;  Laterality: N/A;  POSTERIOR SPINAL FUSION CERVICAL 3-4, CERVICAL 4-5, CERVICAL 5-6, CERVICAL 6-7 WITH INSTRUMENATION AND ALLOGRAFT   SHOULDER ARTHROSCOPY/ DEBRIDEMENT LABRAL TEAR/  BICEPS TENOTOMY Left 09-24-2011   SUBMUCOSAL TATTOO INJECTION  03/06/2023   Procedure: SUBMUCOSAL TATTOO INJECTION;  Surgeon: Lemar Lofty., MD;  Location: [facility]COPY;  Service: Gastroenterology;;   TONSILLECTOMY  as child   TRANSTHORACIC ECHOCARDIOGRAM  02-21-2010   normal LVF/  ef 55-60%/ mild to moderate MR/  moderate LAE/  mild TR   YAG LASER APPLICATION Right 01/26/2013   Procedure: YAG LASER APPLICATION;  Surgeon: Vita Erm., MD;  Location: [facility] OR;  Service: Ophthalmology;  Laterality: Right;   YAG LASER CAPSULOTOMY, LEFT EYE  01-08-2011    SOCIAL HISTORY: He reports that he has been smoking cigarettes. He has a 59 pack-year smoking history. He has never used smokeless tobacco. He reports that he does not drink alcohol and does not  use drugs. Social History   Socioeconomic History   Marital status: Married    Spouse name: Not on file   Number of children: 3   Years of education: 12   Highest education level: High school graduate  Occupational History   Occupation: Retired - poured concrete  Tobacco Use   Smoking status: Every Day    Current packs/day: 1.00    Average packs/day: 1 pack/day for 59.0 years (59.0 ttl pk-yrs)    Types: Cigarettes   Smokeless tobacco: Never   Tobacco comments:    Started Chantix 10/26/16  Vaping Use   Vaping status: Never Used  Substance and Sexual Activity   Alcohol use: No    Comment: none since approx 2014- heavy before   Drug use: No   Sexual activity: Not Currently  Other Topics Concern   Not on file  Social History Narrative   Lives with wife and  2 of his children in a one story home.  Has 3 children.  Retired Set designer.  Education: high school.   Right Handed   Social Determinants of Health   Financial Resource Strain: Not on File (04/09/2022)   [facility] [employer] [facility] [employer]    Financial Resource Strain: 0  Food Insecurity: Not on File (09/16/2023)   Received from [facility] Insecurity    Food: 0  Transportation Needs: No Transportation Needs (04/08/2023)   PRAPARE - Administrator, Civil Service (Medical): No    Lack of Transportation (Non-Medical): No  Physical Activity: Not on File (04/09/2022)   Received from [place], [place]   Physical Activity    Physical Activity: 0  Stress: Not on File (04/09/2022)   Received from [facility], [place]   Stress    Stress: 0  Social Connections: Not on File (09/04/2023)   Received from [facility]   Social Connections    Connectedness: 0  Intimate Partner Violence: Not At Risk (04/08/2023)   Humiliation, Afraid, Rape, and Kick questionnaire    Fear of Current or Ex-Partner: No    Emotionally Abused: No    Physically Abused: No    Sexually Abused: No    FAMILY HISTORY: Family History  Problem Relation Age of Onset   Unexplained death Mother        died at a young age, no known cause   Heart attack Father        age unknown   Cancer Sister        unknown   Diabetes Other    Cancer Other    Colon cancer Neg Hx    Stomach cancer Neg Hx    Esophageal cancer Neg Hx     ALLERGIES:  He is allergic to ultram [tramadol].  MEDICATIONS:  Current Outpatient Medications  Medication Sig Dispense Refill   ALPHAGAN P 0.1 % SOLN Place 1 drop into the right eye 3 (three) times daily.     aspirin EC 81 MG tablet Take 81 mg by mouth daily. Swallow whole.     bimatoprost (LUMIGAN) 0.01 % SOLN Place 1 drop into both eyes at bedtime. 30 mL 3   feeding supplement (ENSURE ENLIVE / ENSURE PLUS) LIQD Take 237 mLs by mouth 3 (three) times daily between meals. 237 mL  12   FEROSUL 325 (65 Fe) MG tablet TAKE 1 TABLET BY MOUTH DAILY WITH BREAKFAST 30 tablet 0   Fish Oil-Cholecalciferol (FISH OIL + D3 PO) Take 1 capsule by mouth daily.  HYDROcodone-acetaminophen (NORCO/VICODIN) 5-325 MG tablet Take 1 tablet by mouth every 6 (six) hours as needed.     iron polysaccharides (NIFEREX) 150 MG capsule Take 150 mg by mouth daily.     ketorolac (ACULAR) 0.4 % SOLN Apply to eye.     lidocaine (XYLOCAINE) 2 % solution SMARTSIG:By Mouth     meclizine (ANTIVERT) 25 MG tablet Take 25 mg by mouth 3 (three) times daily as needed for dizziness.     mirtazapine (REMERON) 15 MG tablet Take 15 mg by mouth at bedtime.     octreotide (SANDOSTATIN) 100 MCG/ML SOLN injection Inject 1 mL (100 mcg total) into the skin 3 (three) times daily. 90 mL 2   oxybutynin (DITROPAN-XL) 10 MG 24 hr tablet Take 1 tablet by mouth daily.     oxyCODONE (ROXICODONE) 5 MG immediate release tablet 1 tab PO q6 hours prn pain 20 tablet 0   polyethylene glycol (MIRALAX / GLYCOLAX) 17 g packet Take 17 g by mouth 2 (two) times daily. 60 packet 3   pravastatin (PRAVACHOL) 80 MG tablet Take 80 mg by mouth daily.  2   silodosin (RAPAFLO) 4 MG CAPS capsule Take 4 mg by mouth daily.     No current facility-administered medications for this visit.    REVIEW OF SYSTEMS:    Review of Systems - Oncology  All other pertinent systems were reviewed and were negative except as mentioned above.  PHYSICAL EXAMINATION:  ECOG PERFORMANCE STATUS: 1 - Symptomatic but completely ambulatory  Vitals:   11/25/23 1011  BP: (!) 102/50  Pulse: 68  Resp: 18  Temp: 97.8 F (36.6 C)  SpO2: 100%   Filed Weights   11/25/23 1011  Weight: 136 lb 1.6 oz (61.7 kg)    Physical Exam Constitutional:      General: He is not in acute distress.    Appearance: Normal appearance.  HENT:     Head: Normocephalic and atraumatic.  Eyes:     General: No scleral icterus.    Conjunctiva/sclera: Conjunctivae normal.   Cardiovascular:     Rate and Rhythm: Normal rate and regular rhythm.     Heart sounds: Normal heart sounds.  Pulmonary:     Effort: Pulmonary effort is normal.     Breath sounds: Normal breath sounds.  Abdominal:     General: There is no distension.  Musculoskeletal:     Right lower leg: No edema.     Left lower leg: No edema.  Neurological:     General: No focal deficit present.     Mental Status: He is alert and oriented to person, place, and time.  Psychiatric:        Mood and Affect: Mood normal.        Behavior: Behavior normal.        Thought Content: Thought content normal.      LABORATORY DATA:   I have reviewed the data as listed.  Results for orders placed or performed in visit on 11/25/23  Vitamin B12  Result Value Ref Range   Vitamin B-12 762 180 - 914 pg/mL  Lactate dehydrogenase  Result Value Ref Range   LDH 161 98 - 192 U/L  Iron and Iron Binding Capacity ([facility] only)  Result Value Ref Range   Iron 47 45 - 182 ug/dL   TIBC 130 (H) 865 - 784 ug/dL   Saturation Ratios 9 (L) 17.9 - 39.5 %   UIBC 456 (H) 117 - 376 ug/dL  Comprehensive metabolic panel  Result Value Ref Range   Sodium 140 135 - 145 mmol/L   Potassium 4.0 3.5 - 5.1 mmol/L   Chloride 107 98 - 111 mmol/L   CO2 25 22 - 32 mmol/L   Glucose, Bld 105 (H) 70 - 99 mg/dL   BUN 26 (H) 8 - 23 mg/dL   Creatinine, Ser 1.61 (H) 0.61 - 1.24 mg/dL   Calcium 09.6 8.9 - 04.5 mg/dL   Total Protein 7.5 6.5 - 8.1 g/dL   Albumin 4.5 3.5 - 5.0 g/dL   AST 10 (L) 15 - 41 U/L   ALT 10 0 - 44 U/L   Alkaline Phosphatase 60 38 - 126 U/L   Total Bilirubin 0.3 <1.2 mg/dL   GFR, Estimated 43 (L) >60 mL/min   Anion gap 8 5 - 15  CBC with Differential/Platelet  Result Value Ref Range   WBC 9.6 4.0 - 10.5 K/uL   RBC 4.20 (L) 4.22 - 5.81 MIL/uL   Hemoglobin 11.2 (L) 13.0 - 17.0 g/dL   HCT 40.9 (L) 81.1 - 91.4 %   MCV 83.3 80.0 - 100.0 fL   MCH 26.7 26.0 - 34.0 pg   MCHC 32.0 30.0 - 36.0 g/dL   RDW 78.2 (H)  95.6 - 15.5 %   Platelets 308 150 - 400 K/uL   nRBC 0.0 0.0 - 0.2 %   Neutrophils Relative % 78 %   Neutro Abs 7.5 1.7 - 7.7 K/uL   Lymphocytes Relative 12 %   Lymphs Abs 1.2 0.7 - 4.0 K/uL   Monocytes Relative 7 %   Monocytes Absolute 0.6 0.1 - 1.0 K/uL   Eosinophils Relative 2 %   Eosinophils Absolute 0.2 0.0 - 0.5 K/uL   Basophils Relative 1 %   Basophils Absolute 0.1 0.0 - 0.1 K/uL   Immature Granulocytes 0 %   Abs Immature Granulocytes 0.03 0.00 - 0.07 K/uL     RADIOGRAPHIC STUDIES:  No pertinent imaging studies available to review.   ASSESSMENT & PLAN:   82 y.o. 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  Iron deficiency anemia due to chronic blood loss Hemoglobin levels have fluctuated significantly over the past year. Patient reports no visible blood loss.  -Anemia and iron deficiency are presumed to be from microscopic blood loss from small bowel AVMs.  He did receive APC in the past for his AVMs.  -Labs today revealed  - B12, and folic acid are pending from today.  -If iron levels are low, arrange for IV iron infusion at [facility] center.  -Continue monitoring for signs of bleeding.  - Follow-up in 3 months to reassess anemia and potential need for further iron supplementation.  Anemia of renal disease - Anemia seems to be multifactorial in his case from iron deficiency and also anemia of renal disease.  We will check SPEP, IFE, serum free light chains to rule out any underlying monoclonal gammopathy, although clinically this seems to be less likely.  AVM (arteriovenous malformation) of  small bowel, acquired - Most likely culprit for his iron deficiency anemia.  Continue close follow-up with gastroenterology department.   Since the cause of anemia seems to be obvious from iron deficiency, I  am not pursuing extensive workup at this time.  If inadequate response to IV iron is noted, we will pursue workup to rule out other etiologies.  Orders Placed This Encounter  Procedures   CBC with Differential/Platelet    Standing Status:   Future    Number of Occurrences:   1    Standing Expiration Date:   11/24/2024   Comprehensive metabolic panel    Standing Status:   Future    Number of Occurrences:   1    Standing Expiration Date:   11/24/2024   Iron and Iron Binding Capacity ([facility] only)    Standing Status:   Future    Number of Occurrences:   1    Standing Expiration Date:   11/24/2024   Lactate dehydrogenase    Standing Status:   Future    Number of Occurrences:   1    Standing Expiration Date:   11/24/2024   Ferritin    Standing Status:   Future    Number of Occurrences:   1    Standing Expiration Date:   11/24/2024   Folate    Standing Status:   Future    Number of Occurrences:   1    Standing Expiration Date:   11/24/2024   Vitamin B12    Standing Status:   Future    Number of Occurrences:   1    Standing Expiration Date:   11/24/2024   Multiple Myeloma Panel (SPEP&IFE w/QIG)    Standing Status:   Future    Number of Occurrences:   1    Standing Expiration Date:   11/24/2024   Kappa/lambda light chains    Standing Status:   Future    Number of Occurrences:   1    Standing Expiration Date:   11/24/2024     The total time spent in the appointment was 55 minutes encounter with patients including review of chart and various tests results, discussions about plan of care and coordination of care plan.  I reviewed lab results and outside records for this visit and discussed relevant results with the patient. Diagnosis, plan of care and treatment options were also  discussed in detail with the patient. Opportunity provided to ask questions and answers provided to his apparent satisfaction. Provided instructions to call our clinic with any problems, questions or concerns prior to return visit. I recommended to continue follow-up with PCP and sub-specialists. He verbalized understanding and agreed with the plan. No barriers to learning was detected.   Future Appointments  Date Time Provider Department Center  12/21/2023 11:15 AM [place], [place] [facility]  02/24/2024 10:30 AM [facility] None  02/24/2024 11:00 AM [place] Staheli, Archie Patten, MD [facility] None     Meryl Crutch, MD [place] [facility] - A DEPT OF [facility] [address] [place] [address] Dept: [phone] Dept Fax: [phone]  11/25/2023 2:29 PM   This document was completed utilizing speech recognition software. Grammatical errors, random word insertions, pronoun errors, and incomplete sentences are an occasional consequence of this system due to software limitations, ambient noise, and hardware issues. Any formal questions or concerns about the content, text or information contained within the body of this dictation should be directly addressed to the provider for clarification.

## 2023-11-25 NOTE — Assessment & Plan Note (Addendum)
Hemoglobin levels have fluctuated significantly over the past year. Patient reports no visible blood loss.  -Anemia and iron deficiency are presumed to be from microscopic blood loss from small bowel AVMs.  He did receive APC in the past for his AVMs.  -Labs today revealed hemoglobin of 11.2, hematocrit 35, MCV 83.3.  White count and platelet count are within normal limits.  Creatinine 1.57.  Otherwise grossly unremarkable CMP.  Iron studies showed evidence of iron deficiency with iron saturation of 9%, iron binding capacity increased at 456.  Ferritin pending.  B12 within normal limits.  LDH normal.  Folate pending.  -Since iron levels remain low despite oral supplementation, we will proceed with IV iron using Feraheme weekly x 2 doses at our infusion center on [address].  Our clinic will arrange appropriate appointments.  -Continue monitoring for signs of bleeding.  - Follow-up in 3 months to reassess anemia and potential need for further iron supplementation.

## 2023-11-26 LAB — KAPPA/LAMBDA LIGHT CHAINS
Kappa free light chain: 46.5 mg/L — ABNORMAL HIGH (ref 3.3–19.4)
Kappa, lambda light chain ratio: 3.21 — ABNORMAL HIGH (ref 0.26–1.65)
Lambda free light chains: 14.5 mg/L (ref 5.7–26.3)

## 2023-12-01 ENCOUNTER — Other Ambulatory Visit: Payer: Self-pay | Admitting: Podiatry

## 2023-12-01 ENCOUNTER — Telehealth: Payer: Self-pay

## 2023-12-01 LAB — MULTIPLE MYELOMA PANEL, SERUM
Albumin SerPl Elph-Mcnc: 4.2 g/dL (ref 2.9–4.4)
Albumin/Glob SerPl: 1.4 (ref 0.7–1.7)
Alpha 1: 0.2 g/dL (ref 0.0–0.4)
Alpha2 Glob SerPl Elph-Mcnc: 0.8 g/dL (ref 0.4–1.0)
B-Globulin SerPl Elph-Mcnc: 1.4 g/dL — ABNORMAL HIGH (ref 0.7–1.3)
Gamma Glob SerPl Elph-Mcnc: 0.8 g/dL (ref 0.4–1.8)
Globulin, Total: 3.2 g/dL (ref 2.2–3.9)
IgA: 330 mg/dL (ref 61–437)
IgG (Immunoglobin G), Serum: 950 mg/dL (ref 603–1613)
IgM (Immunoglobulin M), Srm: 42 mg/dL (ref 15–143)
Total Protein ELP: 7.4 g/dL (ref 6.0–8.5)

## 2023-12-01 MED ORDER — OXYCODONE-ACETAMINOPHEN 10-325 MG PO TABS: 1.0000 | ORAL_TABLET | Freq: Three times a day (TID) | ORAL | 0 refills | Status: AC | PRN

## 2023-12-01 NOTE — Telephone Encounter (Signed)
Patient called, requesting a refill on pain medication

## 2023-12-03 ENCOUNTER — Ambulatory Visit: Payer: 59

## 2023-12-03 VITALS — BP 112/71 | HR 66 | Temp 98.4°F | Resp 16 | Ht 74.0 in | Wt 135.2 lb

## 2023-12-03 DIAGNOSIS — D500 Iron deficiency anemia secondary to blood loss (chronic): Secondary | ICD-10-CM | POA: Diagnosis not present

## 2023-12-03 DIAGNOSIS — K5521 Angiodysplasia of colon with hemorrhage: Secondary | ICD-10-CM

## 2023-12-03 MED ORDER — SODIUM CHLORIDE 0.9 % IV SOLN
510.0000 mg | Freq: Once | INTRAVENOUS | Status: AC
  Administered 2023-12-03: 510 mg via INTRAVENOUS
  Filled 2023-12-03: qty 17

## 2023-12-03 MED ORDER — DIPHENHYDRAMINE HCL 25 MG PO CAPS
25.0000 mg | ORAL_CAPSULE | Freq: Once | ORAL | Status: AC
  Administered 2023-12-03: 25 mg via ORAL
  Filled 2023-12-03: qty 1

## 2023-12-03 MED ORDER — ACETAMINOPHEN 325 MG PO TABS
650.0000 mg | ORAL_TABLET | Freq: Once | ORAL | Status: AC
  Administered 2023-12-03: 650 mg via ORAL
  Filled 2023-12-03: qty 2

## 2023-12-03 NOTE — Progress Notes (Signed)
Diagnosis: Iron Deficiency Anemia  Provider:  Chilton Greathouse MD  Procedure: IV Infusion  IV Type: Peripheral, IV Location: L Hand  Feraheme (Ferumoxytol), Dose: 510 mg  Infusion Start Time: 0932  Infusion Stop Time: 0948  Post Infusion IV Care: Observation period completed and Peripheral IV Discontinued  Discharge: Condition: Good, Destination: Home . AVS Provided  Performed by:  Adriana Mccallum, RN

## 2023-12-03 NOTE — Patient Instructions (Signed)
 Ferumoxytol Injection What is this medication? FERUMOXYTOL (FER ue MOX i tol) treats low levels of iron in your body (iron deficiency anemia). Iron is a mineral that plays an important role in making red blood cells, which carry oxygen from your lungs to the rest of your body. This medicine may be used for other purposes; ask your health care provider or pharmacist if you have questions. COMMON BRAND NAME(S): Feraheme What should I tell my care team before I take this medication? They need to know if you have any of these conditions: Anemia not caused by low iron levels High levels of iron in the blood Magnetic resonance imaging (MRI) test scheduled An unusual or allergic reaction to iron, other medications, foods, dyes, or preservatives Pregnant or trying to get pregnant Breastfeeding How should I use this medication? This medication is injected into a vein. It is given by your care team in a hospital or clinic setting. Talk to your care team the use of this medication in children. Special care may be needed. Overdosage: If you think you have taken too much of this medicine contact a poison control center or emergency room at once. NOTE: This medicine is only for you. Do not share this medicine with others. What if I miss a dose? It is important not to miss your dose. Call your care team if you are unable to keep an appointment. What may interact with this medication? Other iron products This list may not describe all possible interactions. Give your health care provider a list of all the medicines, herbs, non-prescription drugs, or dietary supplements you use. Also tell them if you smoke, drink alcohol, or use illegal drugs. Some items may interact with your medicine. What should I watch for while using this medication? Visit your care team regularly. Tell your care team if your symptoms do not start to get better or if they get worse. You may need blood work done while you are taking this  medication. You may need to follow a special diet. Talk to your care team. Foods that contain iron include: whole grains/cereals, dried fruits, beans, or peas, leafy green vegetables, and organ meats (liver, kidney). What side effects may I notice from receiving this medication? Side effects that you should report to your care team as soon as possible: Allergic reactions--skin rash, itching, hives, swelling of the face, lips, tongue, or throat Low blood pressure--dizziness, feeling faint or lightheaded, blurry vision Shortness of breath Side effects that usually do not require medical attention (report to your care team if they continue or are bothersome): Flushing Headache Joint pain Muscle pain Nausea Pain, redness, or irritation at injection site This list may not describe all possible side effects. Call your doctor for medical advice about side effects. You may report side effects to FDA at 1-800-FDA-1088. Where should I keep my medication? This medication is given in a hospital or clinic. It will not be stored at home. NOTE: This sheet is a summary. It may not cover all possible information. If you have questions about this medicine, talk to your doctor, pharmacist, or health care provider.  2024 Elsevier/Gold Standard (2023-05-14 00:00:00)

## 2023-12-10 ENCOUNTER — Ambulatory Visit: Payer: 59

## 2023-12-10 VITALS — BP 137/72 | HR 69 | Temp 98.1°F | Resp 18 | Ht 74.0 in | Wt 136.8 lb

## 2023-12-10 DIAGNOSIS — K5521 Angiodysplasia of colon with hemorrhage: Secondary | ICD-10-CM

## 2023-12-10 DIAGNOSIS — D500 Iron deficiency anemia secondary to blood loss (chronic): Secondary | ICD-10-CM | POA: Diagnosis not present

## 2023-12-10 MED ORDER — DIPHENHYDRAMINE HCL 25 MG PO CAPS
25.0000 mg | ORAL_CAPSULE | Freq: Once | ORAL | Status: AC
  Administered 2023-12-10: 25 mg via ORAL
  Filled 2023-12-10: qty 1

## 2023-12-10 MED ORDER — ACETAMINOPHEN 325 MG PO TABS
650.0000 mg | ORAL_TABLET | Freq: Once | ORAL | Status: AC
  Administered 2023-12-10: 650 mg via ORAL
  Filled 2023-12-10: qty 2

## 2023-12-10 MED ORDER — SODIUM CHLORIDE 0.9 % IV SOLN
510.0000 mg | Freq: Once | INTRAVENOUS | Status: AC
  Administered 2023-12-10: 510 mg via INTRAVENOUS
  Filled 2023-12-10: qty 17

## 2023-12-10 NOTE — Progress Notes (Signed)
Diagnosis: Iron Deficiency Anemia  Provider:  Chilton Greathouse MD  Procedure: IV Infusion  IV Type: Peripheral, IV Location: L Hand  Feraheme (Ferumoxytol), Dose: 510 mg  Infusion Start Time: 1039  Infusion Stop Time: 1054  Post Infusion IV Care: Patient declined observation and Peripheral IV Discontinued  Discharge: Condition: Stable, Destination: Home . AVS Provided  Performed by:  Wyvonne Lenz, RN

## 2023-12-21 ENCOUNTER — Other Ambulatory Visit: Payer: Self-pay | Admitting: Podiatry

## 2023-12-21 ENCOUNTER — Ambulatory Visit (INDEPENDENT_AMBULATORY_CARE_PROVIDER_SITE_OTHER): Payer: 59 | Admitting: Podiatry

## 2023-12-21 ENCOUNTER — Encounter: Payer: Self-pay | Admitting: Podiatry

## 2023-12-21 DIAGNOSIS — L03032 Cellulitis of left toe: Secondary | ICD-10-CM | POA: Diagnosis not present

## 2023-12-21 DIAGNOSIS — L02612 Cutaneous abscess of left foot: Secondary | ICD-10-CM

## 2023-12-21 DIAGNOSIS — G5782 Other specified mononeuropathies of left lower limb: Secondary | ICD-10-CM

## 2023-12-21 MED ORDER — OXYCODONE-ACETAMINOPHEN 10-325 MG PO TABS: 1.0000 | ORAL_TABLET | Freq: Three times a day (TID) | ORAL | 0 refills | Status: AC | PRN

## 2023-12-21 NOTE — Progress Notes (Signed)
 He presents today complaining of painful neuroma third interspace left foot.  States that his hemoglobin A1c is 8.1 he is also got a painful spot on the medial aspect of the hallux right.  Objective: Vital signs are stable alert oriented x 3.  Palpable Mulder's click third interspace of the left foot with minimal palpable pulses left.  He does have a blood blister to the medial aspect of the hallux with a small hyperkeratotic lesion present.  There does not demonstrate any cellulitis.  Assessment: Pain in limb secondary to neuroma and blood blister.  Plan: I lanced a blood blister for him today and placed some Neosporin Betadine  and padding with a Band-Aid.  Also injected the neuroma third interdigital space and refilled his Percocet.

## 2023-12-29 ENCOUNTER — Telehealth: Payer: Self-pay | Admitting: Podiatry

## 2023-12-29 NOTE — Telephone Encounter (Signed)
 Pt called stating his big toe and another toe is hurting and he is wanting something called in for pain.

## 2024-01-03 ENCOUNTER — Ambulatory Visit (INDEPENDENT_AMBULATORY_CARE_PROVIDER_SITE_OTHER): Payer: 59 | Admitting: Podiatry

## 2024-01-03 ENCOUNTER — Encounter: Payer: Self-pay | Admitting: Podiatry

## 2024-01-03 DIAGNOSIS — D367 Benign neoplasm of other specified sites: Secondary | ICD-10-CM | POA: Diagnosis not present

## 2024-01-03 DIAGNOSIS — G5782 Other specified mononeuropathies of left lower limb: Secondary | ICD-10-CM | POA: Diagnosis not present

## 2024-01-03 DIAGNOSIS — G5762 Lesion of plantar nerve, left lower limb: Secondary | ICD-10-CM | POA: Diagnosis not present

## 2024-01-03 NOTE — Progress Notes (Signed)
 Subjective:  Patient ID: Russell [name], male    DOB: August 05, 1941,   MRN: [id number]  Chief Complaint  Patient presents with   Nail Problem    83 y.o. male presents for concern of possible infection around his left great toe. States he noticies it a few days ago and was having more pain in the toe. It has subsided somewhat. He regularly follows with Dr. Verta for neuroma on left foot and had an injection recent last time he was seen . Denies any other pedal complaints. Denies n/v/f/c.   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   COPD (chronic obstructive pulmonary disease) (HCC)    Coronary atherosclerosis    denied knowledge of this 10/27/16   Depression    Diverticulosis of colon    DYSPHAGIA UNSPECIFIED 05/16/2009   Qualifier: Diagnosis of  By: Adella MD, Elizabeth     Dyspnea    EARLY SATIETY 11/04/2007   Qualifier: Diagnosis of  By: Adella MD, Almarie     ED (erectile dysfunction)    Full dentures    GERD (gastroesophageal reflux disease)    not current   Glaucoma    History of gastritis    History of scabies    2008   History of seizure    2013-- x2   idiopathic --  none since   History of syncope    03-22-2014  DX  VAGAL RESPONSE   Hyperlipidemia, mixed    Hypertension    Mitral regurgitation    Peripheral arterial disease (HCC)    one vessel runoff bilaterally via peroneal arteries   Prostate cancer (HCC)    Seizures (HCC) 03/2014   ? or  syncope- patient refused to be admitted for further studies- felt better   Type 2 diabetes, diet controlled (HCC)    no meds; patient and his wife said no- have not been told 01/21/17   Wears glasses     Objective:  Physical Exam: Vascular: DP/PT pulses 2/4 bilateral. CFT <3 seconds. Normal hair growth on digits. No edema.  Skin. No lacerations or abrasions bilateral feet. Hyperkeratosis noted to medial left hallux and plantar fifth metatarsal tender to palpation. No incurvation of the nail no symptoms noted around the nail. No erythema edema or purulence noted.  Musculoskeletal: MMT 5/5 bilateral lower extremities in DF, PF, Inversion and Eversion. Deceased ROM in DF of ankle joint. Tender in third interspace.  Neurological: Sensation intact to light touch.   Assessment:   1. Benign neoplasm of other specified sites   2. 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 -Discussed benign skin lesions with patient and treatment options.  -Hyperkeratotic tissue was debrided with chisel without incident as courtesy . .  -Toe cap provided.  -Encouraged daily moisturizing -Discussed use of pumice stone -Advised good supportive shoes and inserts. Metatarsal pad for the neuroma area. Did request injection but is early  for injection.  -Patient to return to office as needed or sooner if condition worsens.   Asberry Failing, DPM

## 2024-01-13 ENCOUNTER — Ambulatory Visit: Payer: 59 | Admitting: Podiatry

## 2024-01-18 ENCOUNTER — Other Ambulatory Visit: Payer: Self-pay | Admitting: Podiatry

## 2024-01-18 ENCOUNTER — Encounter: Payer: Self-pay | Admitting: Podiatry

## 2024-01-18 ENCOUNTER — Ambulatory Visit (INDEPENDENT_AMBULATORY_CARE_PROVIDER_SITE_OTHER): Payer: 59 | Admitting: Podiatry

## 2024-01-18 DIAGNOSIS — M19072 Primary osteoarthritis, left ankle and foot: Secondary | ICD-10-CM | POA: Diagnosis not present

## 2024-01-18 DIAGNOSIS — G5782 Other specified mononeuropathies of left lower limb: Secondary | ICD-10-CM | POA: Diagnosis not present

## 2024-01-18 MED ORDER — OXYCODONE-ACETAMINOPHEN 10-325 MG PO TABS: 1.0000 | ORAL_TABLET | Freq: Three times a day (TID) | ORAL | 0 refills | Status: AC | PRN

## 2024-01-18 MED ORDER — TRIAMCINOLONE ACETONIDE 40 MG/ML IJ SUSP
20.0000 mg | Freq: Once | INTRAMUSCULAR | Status: AC
  Administered 2024-01-18: 20 mg

## 2024-01-19 NOTE — Progress Notes (Signed)
He presents today with continued pain to the third interdigital space of his left foot.  States he has a new area now right here as he points to the medial aspect of the interphalangeal joint to the first metatarsal phalangeal joint plantar medial aspect.  He saw Dr. Ralene Cork last time and she just trimmed on the callus.  Objective: Vital signs are stable alert oriented x 3.  There is no erythema edema salines drainage odor has pain on the plantar medial aspect of the proximal phalanx there is no erythema edema cellulitis drainage or odor to the area.  Just more of a neuritis type symptomatology with radiating symptoms distally and proximally.  Most likely this could be a just a neuritis or neuroma.  Continued stump neuroma pain to the third interdigital space with pain on palpation.  Assessment: Pain on palpation third interdigital space of the left foot.  And now radiating pain with capsulitis neuritis to the hallux.  Plan: I injected dexamethasone along the medial aspect of the hallux with local anesthetic right foot.  Also injected similarly to the stump neuroma third interdigital space with dexamethasone.  Also prescribed him his 21 tablets of 10 mg Percocet and I will follow-up with him in 1 month.

## 2024-02-01 ENCOUNTER — Telehealth: Payer: Self-pay | Admitting: Podiatry

## 2024-02-01 NOTE — Telephone Encounter (Signed)
Patient called requesting a prescription for pain medication to be sent in to the pharmacy. Thank you.

## 2024-02-02 DIAGNOSIS — Z87898 Personal history of other specified conditions: Secondary | ICD-10-CM | POA: Insufficient documentation

## 2024-02-02 DIAGNOSIS — I5022 Chronic systolic (congestive) heart failure: Secondary | ICD-10-CM | POA: Insufficient documentation

## 2024-02-03 DIAGNOSIS — K529 Noninfective gastroenteritis and colitis, unspecified: Secondary | ICD-10-CM | POA: Insufficient documentation

## 2024-02-03 DIAGNOSIS — N401 Enlarged prostate with lower urinary tract symptoms: Secondary | ICD-10-CM | POA: Insufficient documentation

## 2024-02-04 DIAGNOSIS — G5600 Carpal tunnel syndrome, unspecified upper limb: Secondary | ICD-10-CM | POA: Insufficient documentation

## 2024-02-07 ENCOUNTER — Encounter: Payer: Self-pay | Admitting: Gastroenterology

## 2024-02-22 ENCOUNTER — Ambulatory Visit (INDEPENDENT_AMBULATORY_CARE_PROVIDER_SITE_OTHER): Payer: 59 | Admitting: Podiatry

## 2024-02-22 ENCOUNTER — Encounter: Payer: Self-pay | Admitting: Podiatry

## 2024-02-22 ENCOUNTER — Other Ambulatory Visit: Payer: Self-pay | Admitting: Podiatry

## 2024-02-22 DIAGNOSIS — B351 Tinea unguium: Secondary | ICD-10-CM

## 2024-02-22 DIAGNOSIS — M79676 Pain in unspecified toe(s): Secondary | ICD-10-CM

## 2024-02-22 DIAGNOSIS — G5782 Other specified mononeuropathies of left lower limb: Secondary | ICD-10-CM

## 2024-02-22 MED ORDER — OXYCODONE-ACETAMINOPHEN 10-325 MG PO TABS: 1.0000 | ORAL_TABLET | Freq: Three times a day (TID) | ORAL | 0 refills | Status: AC | PRN

## 2024-02-22 MED ORDER — TRIAMCINOLONE ACETONIDE 40 MG/ML IJ SUSP
20.0000 mg | Freq: Once | INTRAMUSCULAR | Status: AC
  Administered 2024-02-22: 20 mg

## 2024-02-22 NOTE — Progress Notes (Signed)
 He presents today with 2 complaints painful elongated nails and neuroma symptoms left foot.  He states that he also needs more pain medication.  Objective: Vital signs stable he is alert oriented x 3 pulses are still barely palpable to the left lower extremity capillary fill time is immediate toenails are long thick yellow dystrophic clinical mycotic painful palpation as well as debridement.  Stump neuroma still present third interspace left foot distally.  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  Plan: Debridement of toenails 1 through 5 bilaterally.  Injected small amount of dexamethasone left anesthetic to the neuroma site.  I also refilled his pain medication for 7 days.

## 2024-02-23 ENCOUNTER — Other Ambulatory Visit: Payer: Self-pay

## 2024-02-23 DIAGNOSIS — D500 Iron deficiency anemia secondary to blood loss (chronic): Secondary | ICD-10-CM

## 2024-02-24 ENCOUNTER — Inpatient Hospital Stay: Payer: 59 | Admitting: Oncology

## 2024-02-24 ENCOUNTER — Inpatient Hospital Stay: Payer: 59 | Attending: Oncology

## 2024-03-21 ENCOUNTER — Telehealth: Payer: Self-pay | Admitting: Podiatry

## 2024-03-21 ENCOUNTER — Ambulatory Visit: Admitting: Podiatry

## 2024-03-21 ENCOUNTER — Other Ambulatory Visit: Payer: Self-pay | Admitting: Podiatry

## 2024-03-21 MED ORDER — OXYCODONE-ACETAMINOPHEN 10-325 MG PO TABS: 1.0000 | ORAL_TABLET | Freq: Three times a day (TID) | ORAL | 0 refills | Status: AC | PRN

## 2024-03-21 NOTE — Telephone Encounter (Signed)
 Patient is requesting refill for oxycodone. Patient contact telephone number, [phone]

## 2024-03-31 ENCOUNTER — Encounter: Payer: Self-pay | Admitting: Physician Assistant

## 2024-03-31 DIAGNOSIS — R630 Anorexia: Secondary | ICD-10-CM | POA: Insufficient documentation

## 2024-04-03 ENCOUNTER — Encounter (INDEPENDENT_AMBULATORY_CARE_PROVIDER_SITE_OTHER): Payer: Self-pay

## 2024-04-18 ENCOUNTER — Ambulatory Visit (INDEPENDENT_AMBULATORY_CARE_PROVIDER_SITE_OTHER): Admitting: Podiatry

## 2024-04-18 ENCOUNTER — Encounter: Payer: Self-pay | Admitting: Podiatry

## 2024-04-18 ENCOUNTER — Other Ambulatory Visit: Payer: Self-pay | Admitting: Podiatry

## 2024-04-18 DIAGNOSIS — M7672 Peroneal tendinitis, left leg: Secondary | ICD-10-CM | POA: Diagnosis not present

## 2024-04-18 DIAGNOSIS — G5782 Other specified mononeuropathies of left lower limb: Secondary | ICD-10-CM | POA: Diagnosis not present

## 2024-04-18 DIAGNOSIS — Z4000 Encounter for prophylactic removal of unspecified organ: Secondary | ICD-10-CM | POA: Insufficient documentation

## 2024-04-18 MED ORDER — OXYCODONE-ACETAMINOPHEN 10-325 MG PO TABS: 1.0000 | ORAL_TABLET | Freq: Three times a day (TID) | ORAL | 0 refills | Status: AC | PRN

## 2024-04-18 MED ORDER — DEXAMETHASONE SODIUM PHOSPHATE 120 MG/30ML IJ SOLN
4.0000 mg | Freq: Once | INTRAMUSCULAR | Status: AC
  Administered 2024-04-18: 4 mg via INTRA_ARTICULAR

## 2024-04-18 NOTE — Progress Notes (Signed)
 He presents today with 2 complaints painful elongated nails and neuroma symptoms left foot.  He states that he also needs more pain medication.  Objective: Vital signs stable he is alert oriented x 3 pulses are still barely palpable to the left lower extremity capillary fill time is immediate toenails are long thick yellow dystrophic clinical mycotic painful palpation as well as debridement.  Stump neuroma still present third interspace left foot distally.  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  Plan: Debridement of toenails 1 through 5 bilaterally.  Injected small amount of dexamethasone left anesthetic to the neuroma site.  I also refilled his pain medication for 7 days.

## 2024-04-26 ENCOUNTER — Encounter (INDEPENDENT_AMBULATORY_CARE_PROVIDER_SITE_OTHER): Payer: Self-pay

## 2024-05-09 ENCOUNTER — Ambulatory Visit (INDEPENDENT_AMBULATORY_CARE_PROVIDER_SITE_OTHER): Admitting: Podiatry

## 2024-05-09 ENCOUNTER — Other Ambulatory Visit: Payer: Self-pay | Admitting: Podiatry

## 2024-05-09 DIAGNOSIS — G5792 Unspecified mononeuropathy of left lower limb: Secondary | ICD-10-CM

## 2024-05-09 MED ORDER — OXYCODONE-ACETAMINOPHEN 10-325 MG PO TABS: 1.0000 | ORAL_TABLET | Freq: Three times a day (TID) | ORAL | 0 refills | Status: AC | PRN

## 2024-05-09 NOTE — Progress Notes (Signed)
 He presents today complaining of pain to the third interdigital space of the left foot.  He states that it runs all the way up here as he points to the peroneal distribution of the dorsal cutaneous nerves.  Objective: Vital signs stable alert oriented x 3 pulses are barely palpable.  Foot is cool to the touch.  Pain is located over the third interdigital space where previous neuroma has been removed.  He has been injected in this area numerous times.  Assessment: Neuritis intermediate dorsal cutaneous nerve.  Plan: At this point I injected a very small amount of dexamethasone  along the path of this dorsal cutaneous nerve.

## 2024-05-23 ENCOUNTER — Other Ambulatory Visit: Payer: Self-pay | Admitting: Student

## 2024-05-23 NOTE — Telephone Encounter (Signed)
 NOT [facility] PATIENT

## 2024-06-06 ENCOUNTER — Ambulatory Visit (INDEPENDENT_AMBULATORY_CARE_PROVIDER_SITE_OTHER): Admitting: Podiatry

## 2024-06-06 ENCOUNTER — Encounter: Payer: Self-pay | Admitting: Podiatry

## 2024-06-06 ENCOUNTER — Other Ambulatory Visit: Payer: Self-pay | Admitting: Podiatry

## 2024-06-06 DIAGNOSIS — S90812A Abrasion, left foot, initial encounter: Secondary | ICD-10-CM

## 2024-06-06 DIAGNOSIS — M79676 Pain in unspecified toe(s): Secondary | ICD-10-CM | POA: Diagnosis not present

## 2024-06-06 DIAGNOSIS — G5792 Unspecified mononeuropathy of left lower limb: Secondary | ICD-10-CM

## 2024-06-06 DIAGNOSIS — B351 Tinea unguium: Secondary | ICD-10-CM

## 2024-06-06 MED ORDER — TRIAMCINOLONE ACETONIDE 40 MG/ML IJ SUSP
20.0000 mg | Freq: Once | INTRAMUSCULAR | Status: AC
  Administered 2024-06-06: 20 mg

## 2024-06-06 MED ORDER — OXYCODONE-ACETAMINOPHEN 10-325 MG PO TABS: 1.0000 | ORAL_TABLET | Freq: Three times a day (TID) | ORAL | 0 refills | Status: AC | PRN

## 2024-06-07 NOTE — Progress Notes (Signed)
 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  He states that they seem to be doing okay no open lesions.  He is also complaining of painful fourth metatarsal phalangeal joint left.  Objective: Pulses are barely palpable bilateral capillary fill time is sluggish.  His nails are long thick yellow dystrophic clinically mycotic he also has pain on end range of motion of the fourth toe at the metatarsal phalangeal joint left foot.  Assessment: This appears to be more capsulitis today than actual neuritis.  The abrasions to the top of his feet are healing fine.  He also has painful mycotic nails.  Plan: I injected the joint today after sterile Betadine  skin prep with 4 mg of dexamethasone  and local anesthetic.  He tolerated the procedure well without complications.  He was also debrided today 1 through 5 bilateral nails.  Follow-up with me in 1 month I did refill his pain medication.

## 2024-06-22 DIAGNOSIS — G5792 Unspecified mononeuropathy of left lower limb: Secondary | ICD-10-CM | POA: Insufficient documentation

## 2024-06-22 DIAGNOSIS — E611 Iron deficiency: Secondary | ICD-10-CM | POA: Insufficient documentation

## 2024-06-22 DIAGNOSIS — M7989 Other specified soft tissue disorders: Secondary | ICD-10-CM | POA: Insufficient documentation

## 2024-07-04 ENCOUNTER — Encounter: Payer: Self-pay | Admitting: Podiatry

## 2024-07-04 ENCOUNTER — Other Ambulatory Visit: Payer: Self-pay | Admitting: Podiatry

## 2024-07-04 ENCOUNTER — Ambulatory Visit (INDEPENDENT_AMBULATORY_CARE_PROVIDER_SITE_OTHER): Admitting: Podiatry

## 2024-07-04 DIAGNOSIS — M7752 Other enthesopathy of left foot: Secondary | ICD-10-CM | POA: Diagnosis not present

## 2024-07-04 MED ORDER — OXYCODONE-ACETAMINOPHEN 10-325 MG PO TABS: 1.0000 | ORAL_TABLET | Freq: Three times a day (TID) | ORAL | 0 refills | Status: AC | PRN

## 2024-07-04 MED ORDER — TRIAMCINOLONE ACETONIDE 40 MG/ML IJ SUSP
20.0000 mg | Freq: Once | INTRAMUSCULAR | Status: AC
  Administered 2024-07-04: 20 mg

## 2024-07-04 NOTE — Progress Notes (Signed)
.    He presents today complaining of painful left foot.  States it is still tender right and here she points to the fourth metatarsal phalangeal joint area of the left foot.  Objective: Vital signs stable alert oriented x 3.  He has pain on end range of motion of the fourth toe at the metatarsal phalangeal joint.  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  Assessment: Capsulitis forefoot left.  Severe peripheral vascular disease.  Plan: I injected the fourth metatarsal phalangeal joint left foot today with dexamethasone  Kenalog  local anesthetic.  Tolerated procedure well refilled his Percocet for him.

## 2024-07-27 ENCOUNTER — Other Ambulatory Visit: Payer: Self-pay | Admitting: Podiatry

## 2024-07-27 ENCOUNTER — Ambulatory Visit: Admitting: Podiatry

## 2024-07-27 MED ORDER — OXYCODONE-ACETAMINOPHEN 10-325 MG PO TABS: 1.0000 | ORAL_TABLET | Freq: Three times a day (TID) | ORAL | 0 refills | Status: AC | PRN

## 2024-08-15 ENCOUNTER — Encounter: Payer: Self-pay | Admitting: Podiatry

## 2024-08-15 ENCOUNTER — Other Ambulatory Visit: Payer: Self-pay | Admitting: Podiatry

## 2024-08-15 ENCOUNTER — Ambulatory Visit (INDEPENDENT_AMBULATORY_CARE_PROVIDER_SITE_OTHER): Admitting: Podiatry

## 2024-08-15 DIAGNOSIS — B351 Tinea unguium: Secondary | ICD-10-CM | POA: Diagnosis not present

## 2024-08-15 DIAGNOSIS — M19072 Primary osteoarthritis, left ankle and foot: Secondary | ICD-10-CM

## 2024-08-15 DIAGNOSIS — M79676 Pain in unspecified toe(s): Secondary | ICD-10-CM | POA: Diagnosis not present

## 2024-08-15 MED ORDER — TRIAMCINOLONE ACETONIDE 40 MG/ML IJ SUSP
20.0000 mg | Freq: Once | INTRAMUSCULAR | Status: AC
Start: 2024-08-15 — End: 2024-08-15
  Administered 2024-08-15: 20 mg

## 2024-08-15 MED ORDER — OXYCODONE-ACETAMINOPHEN 10-325 MG PO TABS: 1.0000 | ORAL_TABLET | Freq: Three times a day (TID) | ORAL | 0 refills | Status: AC | PRN

## 2024-08-15 NOTE — Progress Notes (Signed)
.    He presents today complaining of painful left foot.  States it is still tender right and here she points to the fourth metatarsal phalangeal joint area of the left foot.  Objective: Vital signs stable alert oriented x 3.  He has pain on end range of motion of the fourth toe at the metatarsal phalangeal joint.  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  Assessment: Capsulitis forefoot left.  Severe peripheral vascular disease.  Painful mycotic nails 1 through 5 bilateral  Plan: I injected the fourth metatarsal phalangeal joint left foot today with dexamethasone  Kenalog  local anesthetic.  Tolerated procedure well refilled his Percocet for him.  Debrided his toenails 1 through 5 bilateral.

## 2024-09-14 ENCOUNTER — Ambulatory Visit: Admitting: Podiatry

## 2024-09-14 ENCOUNTER — Other Ambulatory Visit: Payer: Self-pay | Admitting: Podiatry

## 2024-09-14 MED ORDER — OXYCODONE-ACETAMINOPHEN 10-325 MG PO TABS: 1.0000 | ORAL_TABLET | Freq: Three times a day (TID) | ORAL | 0 refills | Status: AC | PRN

## 2024-09-18 ENCOUNTER — Telehealth: Payer: Self-pay | Admitting: Podiatry

## 2024-09-18 NOTE — Telephone Encounter (Signed)
 Patient called in requesting a refill of oxycodone

## 2024-09-19 NOTE — Telephone Encounter (Signed)
 Left message with date medication has been filled and pharmacy.

## 2024-09-27 ENCOUNTER — Encounter: Payer: Self-pay | Admitting: Physician Assistant

## 2024-09-27 ENCOUNTER — Other Ambulatory Visit: Payer: Self-pay | Admitting: Physician Assistant

## 2024-09-27 DIAGNOSIS — R42 Dizziness and giddiness: Secondary | ICD-10-CM

## 2024-10-05 ENCOUNTER — Other Ambulatory Visit: Payer: Self-pay | Admitting: Podiatry

## 2024-10-05 ENCOUNTER — Ambulatory Visit: Admitting: Podiatry

## 2024-10-05 DIAGNOSIS — G5782 Other specified mononeuropathies of left lower limb: Secondary | ICD-10-CM | POA: Diagnosis not present

## 2024-10-05 MED ORDER — OXYCODONE-ACETAMINOPHEN 10-325 MG PO TABS: 1.0000 | ORAL_TABLET | Freq: Three times a day (TID) | ORAL | 0 refills | Status: AC | PRN

## 2024-10-05 MED ORDER — DEXAMETHASONE SODIUM PHOSPHATE 120 MG/30ML IJ SOLN
4.0000 mg | Freq: Once | INTRAMUSCULAR | Status: AC
  Administered 2024-10-05: 4 mg via INTRA_ARTICULAR

## 2024-10-05 NOTE — Progress Notes (Signed)
.    He presents today complaining of painful left foot.  States it is still tender right and here she points to the fourth metatarsal phalangeal joint area of the left foot.  Objective: Vital signs stable alert oriented x 3.  He has pain on end range of motion of the fourth toe at the metatarsal phalangeal joint.  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  Assessment: Capsulitis forefoot left.  Severe peripheral vascular disease.  Painful mycotic nails 1 through 5 bilateral  Plan: I injected the fourth metatarsal phalangeal joint left foot today with dexamethasone  Kenalog  local anesthetic.  Tolerated procedure well refilled his Percocet for him.  Debrided his toenails 1 through 5 bilateral.

## 2024-10-12 NOTE — Therapy (Signed)
 OUTPATIENT PHYSICAL THERAPY WHEELCHAIR EVALUATION   Patient Name: Russell [name] MRN: [id number] DOB:April 12, 1941, 83 y.o., male Today's Date: 10/13/2024  END OF SESSION:  PT End of Session - 10/13/24 1101     Visit Number 1    Number of Visits 1    PT Start Time 1100    PT Stop Time 1145    PT Time Calculation (min) 45 min    Equipment Utilized During Treatment Gait belt    Activity Tolerance Patient tolerated treatment well    Behavior During Therapy WFL for tasks assessed/performed          Past Medical History:  Diagnosis Date   AKI (acute kidney injury) 06/23/2017   ARM PAIN 06/03/2010   Qualifier: Diagnosis of  By: Cornelious, LPN, Christine     Arthritis    SHOUDLERS, LUMBAR   Atherosclerosis of aorta 02/14/2022   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   BPH with obstruction/lower urinary tract symptoms    BPH with urinary obstruction 01/15/2016   Cerebral atrophy 02/14/2022   Cervical radiculopathy 01/27/2017   Chronic asymptomatic bacteriuria with pyuria 02/14/2022   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   COPD (chronic obstructive pulmonary disease) (HCC)    Coronary atherosclerosis    denied knowledge of this 10/27/16   Depression    Diverticulosis of colon    DYSPHAGIA UNSPECIFIED 05/16/2009   Qualifier: Diagnosis of  By: Adella MD, Elizabeth     Dyspnea    EARLY SATIETY 11/04/2007   Qualifier: Diagnosis of  By: Adella MD, Almarie     ED (erectile dysfunction)    Full dentures    GERD (gastroesophageal reflux disease)    not current   Glaucoma    History of gastritis    History of scabies    2008   History of seizure    2013-- x2   idiopathic --  none since   History of syncope    03-22-2014  DX  VAGAL RESPONSE   Hyperlipidemia, mixed    Hypertension    Mitral regurgitation    Peripheral arterial disease    one vessel runoff bilaterally via peroneal arteries   Prostate cancer (HCC)    Seizures (HCC) 03/2014   ? or syncope-  patient refused to be admitted for further studies- felt better   Type 2 diabetes, diet controlled (HCC)    no meds; patient and his wife said no- have not been told 01/21/17   Wears glasses    Past Surgical History:  Procedure Laterality Date   ABDOMINAL AORTOGRAM W/LOWER EXTREMITY Left 12/18/2020   Procedure: ABDOMINAL AORTOGRAM W/LOWER EXTREMITY;  Surgeon: Magda Debby SAILOR, MD;  [facility]: [facility];  Service: Cardiovascular;  Laterality: Left;   ABDOMINAL AORTOGRAM W/LOWER EXTREMITY N/A 02/26/2021   Procedure: ABDOMINAL AORTOGRAM W/LOWER EXTREMITY;  Surgeon: Magda Debby SAILOR, MD;  Location: [facility];  Service: Cardiovascular;  Laterality: N/A;   ANTERIOR CERVICAL DECOMP/DISCECTOMY FUSION  10-16-2009   C4 -- C6   ANTERIOR CERVICAL DECOMP/DISCECTOMY FUSION N/A 01/27/2017   Procedure: ANTERIOR CERVICAL DECOMPRESSION FUSION CERVICAL 3-4 WITH INSTRUMENTATION AND ALLOGRAFT;  Surgeon: Oneil Priestly, MD;  Location: [facility];  Service: Orthopedics;  Laterality: N/A;  ANTERIOR CERVICAL DECOMPRESSION FUSION CERVICAL 3-4 WITH INSTRUMENTATION AND ALLOGRAFT   CAPSULOTOMY Right 01/26/2013   Procedure: MINOR CAPSULOTOMY;  Surgeon: Debby FORBES Sharalyn Mickey., MD;  Location: [facility];  Service: Ophthalmology;  Laterality: Right;   CARPAL TUNNEL RELEASE Left 01/02/2019   Procedure: LEFT CARPAL  TUNNEL RELEASE;  Surgeon: Murrell Drivers, MD;  Location: [facility];  Service: Orthopedics;  Laterality: Left;   CARPAL TUNNEL RELEASE Left 09/23/2023   Procedure: LEFT CARPAL TUNNEL RELEASE;  Surgeon: Murrell Drivers, MD;  [facility]: [facility];  Service: Orthopedics;  Laterality: Left;   CARPECTOMY Right 03/13/2014   Procedure: RIGHT  PROXIMAL ROW CARPECTOMY;  Surgeon: Drivers JONELLE Murrell, MD;  Location: [facility];  Service: Orthopedics;  Laterality: Right;   CARPECTOMY Left 10/22/2015   Procedure: LEFT WRIST PROXIMAL ROW CARPECTOMY ;  Surgeon: Drivers Murrell, MD;  Location: [facility];   Service: Orthopedics;  Laterality: Left;   CARPOMETACARPAL ([facility]) FUSION OF THUMB Right 03/13/2014   Procedure: RIGHT FUSION OF THUMB CARPOMETACARPAL [facility]) JOINT;  Surgeon: Drivers JONELLE Murrell, MD;  Location: [facility];  Service: Orthopedics;  Laterality: Right;   CARPOMETACARPEL SUSPENSION PLASTY Left 04/04/2020   Procedure: LEFT THUMB TRAPECIECTOMY AND SUSPENSIONPLASTY;  Surgeon: Murrell Drivers, MD;  Location: [facility];  Service: Orthopedics;  Laterality: Left;   CATARACT EXTRACTION W/ INTRAOCULAR LENS  IMPLANT, BILATERAL  2009   COLONOSCOPY  08-31-2007   COLONOSCOPY     COLONOSCOPY N/A 03/06/2023   Procedure: COLONOSCOPY;  Surgeon: Mansouraty, Aloha Raddle., MD;  Location: [facility];  Service: Gastroenterology;  Laterality: N/A;   CRYOABLATION N/A 01/14/2015   Procedure: CRYO ABLATION PROSTATE;  Surgeon: Arlena LILLETTE Gal, MD;  Location: [facility];  Service: Urology;  Laterality: N/A;   ENTEROSCOPY N/A 03/06/2023   Procedure: ENTEROSCOPY;  Surgeon: Wilhelmenia Aloha Raddle., MD;  Location: [facility]COPY;  Service: Gastroenterology;  Laterality: N/A;   ENTEROSCOPY N/A 04/09/2023   Procedure: ENTEROSCOPY;  Surgeon: Leigh Elspeth SQUIBB, MD;  Location: [facility] ENDOSCOPY;  Service: Gastroenterology;  Laterality: N/A;   ESOPHAGOGASTRODUODENOSCOPY  last one 09-11-2008   ESOPHAGOGASTRODUODENOSCOPY (EGD) WITH PROPOFOL  N/A 02/04/2023   Procedure: ESOPHAGOGASTRODUODENOSCOPY (EGD) WITH PROPOFOL ;  Surgeon: Stacia Glendia BRAVO, MD;  Location: [facility] ENDOSCOPY;  Service: Gastroenterology;  Laterality: N/A;   EXCISION MASS LEFT FACE , SUBORBITAL AREA, PLASTER RECONSTRUCTION  02-09-2011   EXCISIONAL DEBRIDEMENT AND REPAIR RIGHT QUADRICEP TENDON  07-05-2000   FOOT SURGERY Left    GIVENS CAPSULE STUDY N/A 04/09/2023   Procedure: GIVENS CAPSULE STUDY;  Surgeon: Leigh Elspeth SQUIBB, MD;  Location: [facility];  Service: Gastroenterology;  Laterality: N/A;   HARDWARE REMOVAL Left  09/23/2023   Procedure: REMOVAL RETAINED HARDWARE LEFT HAND;  Surgeon: Murrell Drivers, MD;  [facility];  Service: Orthopedics;  Laterality: Left;   HOT HEMOSTASIS N/A 02/04/2023   Procedure: HOT HEMOSTASIS (ARGON PLASMA COAGULATION/BICAP);  Surgeon: Stacia Glendia BRAVO, MD;  Location: [facility] ENDOSCOPY;  Service: Gastroenterology;  Laterality: N/A;   HOT HEMOSTASIS N/A 03/06/2023   Procedure: HOT HEMOSTASIS (ARGON PLASMA COAGULATION/BICAP);  Surgeon: Wilhelmenia Aloha Raddle., MD;  Location: [facility] ENDOSCOPY;  Service: Gastroenterology;  Laterality: N/A;   HOT HEMOSTASIS N/A 04/09/2023   Procedure: HOT HEMOSTASIS (ARGON PLASMA COAGULATION/BICAP);  Surgeon: Leigh Elspeth SQUIBB, MD;  Location: [facility]OSCOPY;  Service: Gastroenterology;  Laterality: N/A;   I & D EXTREMITY Right 05/24/2013   Procedure: RIGHT INDEX WOUND DEBRIDEMENT AND CLOSURE;  Surgeon: Alm DELENA Hummer, MD;  Location: [facility];  Service: Orthopedics;  Laterality: Right;   INSERTION OF SUPRAPUBIC CATHETER N/A 01/14/2015   Procedure: SUPRAPUBIC TUBE PLACEMENT;  Surgeon: Arlena LILLETTE Gal, MD;  Location: [facility];  Service: Urology;  Laterality: N/A;   KNEE ARTHROSCOPY Right 2008   LARYNGOSCOPY AND  ESOPHAGOSCOPY  06-13-2010   MARSUPIALIZATION LEFT LARGE VALLECULAR CYST   MASS EXCISION Left 10/22/2015   Procedure: EXCISION MASS OF LEFT INDEX FINGER;  Surgeon: Franky Curia, MD;  Location: [facility];  Service: Orthopedics;  Laterality: Left;   ORCHIECTOMY Left 02/24/2015   Procedure: SCROTAL EXPLORATION WITH LEFT ORCHIECTOMY;  Surgeon: Mark Ottelin, MD;  Location: [facility] OR;  Service: Urology;  Laterality: Left;   PERIPHERAL VASCULAR INTERVENTION Left 02/26/2021   Procedure: PERIPHERAL VASCULAR INTERVENTION;  Surgeon: Magda Debby SAILOR, MD;  [facility];  Service: Cardiovascular;  Laterality: Left;  Superficial femoral   POLYPECTOMY  03/06/2023   Procedure: POLYPECTOMY;  Surgeon: Mansouraty,  Aloha Raddle., MD;  Location: [facility] ENDOSCOPY;  Service: Gastroenterology;;   POSTERIOR CERVICAL FUSION/FORAMINOTOMY N/A 01/28/2017   Procedure: POSTERIOR SPINAL FUSION CERVICAL 3-4, CERVICAL 4-5, CERVICAL 5-6, CERVICAL 6-7 WITH INSTRUMENATION AND ALLOGRAFT;  Surgeon: Oneil Priestly, MD;  Location: [facility];  Service: Orthopedics;  Laterality: N/A;  POSTERIOR SPINAL FUSION CERVICAL 3-4, CERVICAL 4-5, CERVICAL 5-6, CERVICAL 6-7 WITH INSTRUMENATION AND ALLOGRAFT   SHOULDER ARTHROSCOPY/ DEBRIDEMENT LABRAL TEAR/  BICEPS TENOTOMY Left 09-24-2011   SUBMUCOSAL TATTOO INJECTION  03/06/2023   Procedure: SUBMUCOSAL TATTOO INJECTION;  Surgeon: Wilhelmenia Aloha Raddle., MD;  Location: [facility]COPY;  Service: Gastroenterology;;   TONSILLECTOMY  as child   TRANSTHORACIC ECHOCARDIOGRAM  02-21-2010   normal LVF/  ef 55-60%/ mild to moderate MR/  moderate LAE/  mild TR   YAG LASER APPLICATION Right 01/26/2013   Procedure: YAG LASER APPLICATION;  Surgeon: Debby FORBES Sharalyn Raddle., MD;  Location: [facility] OR;  Service: Ophthalmology;  Laterality: Right;   YAG LASER CAPSULOTOMY, LEFT EYE  01-08-2011   Patient Active Problem List   Diagnosis Date Noted   Prophylactic organ removal 04/18/2024   Loss of appetite 03/31/2024   Carpal tunnel syndrome 02/04/2024   Chronic diarrhea 02/03/2024   Nocturia associated with benign prostatic hyperplasia 02/03/2024   Chronic systolic heart failure (HCC) 02/02/2024   History of seizure 02/02/2024   AVM (arteriovenous malformation) of small bowel, acquired 04/09/2023   Acute on chronic blood loss anemia 04/08/2023   Urinary retention 03/07/2023   Protein-calorie malnutrition, severe 03/06/2023   Upper GI bleed 03/03/2023   Gastric AVM 02/04/2023   Gastrointestinal hemorrhage 02/04/2023   Symptomatic anemia 02/03/2023   Gangrene of toe (HCC)    Heart failure with mid-range ejection fraction (HCC) 02/14/2022   Chronic asymptomatic bacteriuria with pyuria 02/14/2022   Atherosclerosis of aorta  02/14/2022   Cerebral atrophy 02/14/2022   Glaucoma 02/14/2022   Syncope and collapse 02/13/2022   Pre-syncope 02/01/2022   Decreased mobility and endurance 11/19/2021   Frequent falls 11/19/2021   Acute on chronic intracranial subdural hematoma (HCC) 09/15/2021   CKD (chronic kidney disease) stage 3, GFR 30-59 ml/min (HCC) - baseline SCr 2.0 09/15/2021   Cognitive communication deficit 07/09/2021   Intracranial bleed (HCC) 07/07/2021   Fall at home, initial encounter    Traumatic hemorrhage of cerebrum without loss of consciousness (HCC)    Primary osteoarthritis of left hand 05/28/2021   Neuroma of third interspace of foot 04/18/2019   Osteoarthritis 05/31/2018   Foot pain, left 05/11/2018   Acute renal failure superimposed on stage 3b chronic kidney disease (HCC) 06/23/2017   Surgery, elective    Peripheral neuropathy 01/27/2017   Peripheral arterial disease 01/23/2016   DM type 2 (diabetes mellitus, type 2) (HCC) 02/22/2015   Anemia of renal disease 02/22/2015   Neuroma digital nerve 09/28/2013  Neuroma, 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   Arthralgia 08/22/2013   Mixed hyperlipidemia 08/22/2013   Generalized tonic clonic epilepsy (HCC) 01/06/2011   BENIGN PROSTATIC HYPERTROPHY, WITH URINARY OBSTRUCTION 06/13/2010   DYSPNEA ON EXERTION 05/15/2010   TOBACCO ABUSE 04/03/2010   Other specified chronic obstructive pulmonary disease (HCC) 04/03/2010   G E R D 04/03/2010   OTHER DYSPHAGIA 04/03/2010   Type 2 diabetes mellitus with hyperglycemia, without long-term current use of insulin  (HCC) 01/14/2010   LUMBAR RADICULOPATHY, LEFT 05/16/2009   COUGH DUE TO ACE INHIBITORS 04/02/2009   DIZZINESS 07/27/2008   HYPERCHOLESTEROLEMIA, MIXED 05/11/2008   CATARACTS, BILATERAL 03/01/2008   MILD SPINAL STENOSIS, CERVICAL 01/12/2008   LEG CRAMPS 01/12/2008   Diverticulosis of colon 08/31/2007   SCABIES 08/26/2007   ERECTILE DYSFUNCTION 08/18/2007   Alcohol abuse 08/18/2007   Essential hypertension  08/17/2007   HEMOCCULT POSITIVE STOOL 07/05/2007   Iron  deficiency anemia due to chronic blood loss 05/31/2007   WEIGHT LOSS, ABNORMAL 05/10/2006    PCP: Dr. Delorise Daring  REFERRING PROVIDER: Joshua Clayborne RAMAN, PA-C  THERAPY DIAG:  Other abnormalities of gait and mobility  Muscle weakness (generalized)  Rationale for Evaluation and Treatment Rehabilitation  SUBJECTIVE:                                                                                                                                                                                           SUBJECTIVE STATEMENT: Pt presents for wheelchair evaluation. Pt is currently using a rolator for mobility. Pt reports of 6-7 fall sin last 6 months. Patient reports of of L arm pain, L leg pain, L foot pain, R foot pain with numbness and tingling in L arm and L leg. Pain prevents patient from prolonged standing and walking.   PRECAUTIONS: Fall  RED FLAGS: None  WEIGHT BEARING RESTRICTIONS No    OCCUPATION: disabled  PLOF:  Needs assistance with homemaking, Needs assistance with gait, and Needs assistance with transfers  PATIENT GOALS: get a power wheelchair         MEDICAL HISTORY:  Primary diagnosis onset: 09/01/2024- date of referral     Medical Diagnosis with ICD-10 code: G57.92 (ICD-10-CM) - Unspecified mononeuropathy of left lower limb  M25.552 (ICD-10-CM) - Pain in left hip  G89.29 (ICD-10-CM) - Chronic pain   [] Progressive disease  Relevant future surgeries:     Height: 6' 2 Weight: 149 lbs Explain recent changes or trends in weight:      History:  Past Medical History:  Diagnosis Date   AKI (acute kidney injury) 06/23/2017   ARM PAIN 06/03/2010   Qualifier: Diagnosis of  By: Cornelious LPN, Christine  Arthritis    SHOUDLERS, LUMBAR   Atherosclerosis of aorta 02/14/2022   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   BPH with obstruction/lower urinary tract symptoms    BPH with urinary obstruction  01/15/2016   Cerebral atrophy 02/14/2022   Cervical radiculopathy 01/27/2017   Chronic asymptomatic bacteriuria with pyuria 02/14/2022   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   COPD (chronic obstructive pulmonary disease) (HCC)    Coronary atherosclerosis    denied knowledge of this 10/27/16   Depression    Diverticulosis of colon    DYSPHAGIA UNSPECIFIED 05/16/2009   Qualifier: Diagnosis of  By: Adella MD, Elizabeth     Dyspnea    EARLY SATIETY 11/04/2007   Qualifier: Diagnosis of  By: Adella MD, Almarie     ED (erectile dysfunction)    Full dentures    GERD (gastroesophageal reflux disease)    not current   Glaucoma    History of gastritis    History of scabies    2008   History of seizure    2013-- x2   idiopathic --  none since   History of syncope    03-22-2014  DX  VAGAL RESPONSE   Hyperlipidemia, mixed    Hypertension    Mitral regurgitation    Peripheral arterial disease    one vessel runoff bilaterally via peroneal arteries   Prostate cancer (HCC)    Seizures (HCC) 03/2014   ? or syncope- patient refused to be admitted for further studies- felt better   Type 2 diabetes, diet controlled (HCC)    no meds; patient and his wife said no- have not been told 01/21/17   Wears glasses        Cardio Status:  Functional Limitations:   [] Intact  [x]  Impaired    HTN  Respiratory Status:  Functional Limitations:   [] Intact  [] Impaired   [x] SOB [] COPD [] O2 Dependent ______LPM  [] Ventilator Dependent  Resp equip:                                                     Objective Measure(s):   Orthotics:   [] Amputee:                                                             [] Prosthesis:        HOME ENVIRONMENT:  [x] House [] Condo/town home [] Apartment [] Asst living [] LTCF         [x] Own  [] Rent   [] Lives alone [x] Lives with others -            wife and daughter                 Hours without assistance: 2-4 hours  [x] Home is accessible to patient                                  Storage of wheelchair:  [x] In home   [] Other Comments:        COMMUNITY :  TRANSPORTATION:  [x] Car [] Chiropodist [] Adapted w/c Lift []  Ambulance [] Other:                     []   Sits in wheelchair during transport   Where is w/c stored during transport?  [] [employer]  []  [employer]  r   [] Self-Driver       Drive while in  Biomedical scientist [] yes [x] no   Employment and/or school:  Specific requirements pertaining to mobility        Other:  COMMUNICATION:  Verbal Communication  [x] WFL [] receptive [] WFL [] expressive [] Understandable  [] Difficult to understand  [] non-communicative  Primary Language:____English__________ 2nd:_____________  Communication provided by:[x] Patient [] Family [] Caregiver [] Translator   [] Uses an Paramedic device     Manufacturer/Model :                                                                MOBILITY/BALANCE:  Sitting Balance  Standing Balance  Transfers  Ambulation   [x] WFL      [] WFL  [x] Independent  []  Independent   [] Uses UE for balance in sitting Comments:  [x] Uses UE/device for stability Comments:  []  Min assist  []  Ambulates independently with       device:___________________      []  Mod assist  [x]  Able to ambulate __100____ feet        safely/functionally/independently - reports of SOB and pain in foot/legs  []  Min assist  []  Min assist  []  Max assist  []  Non-functional ambulator         History/High risk of falls   []  Mod assist  []  Mod assist  []  Dependent  []  Unable to ambulate   []  Max  assist  []  Max assist  Transfer method:[] 1 person [] 2 person [] sliding board [] squat pivot [x] stand pivot [] mechanical patient lift  [] other:   []  Unable  []  Unable    Fall History: # of falls in the past 6 months? 6-7 # of "near" falls in the past 6 months? Multiple near falls    CURRENT SEATING / MOBILITY:  Current Mobility Device: [] None [x] Cane/Walker [] Manual [] Dependent [] Dependent w/ Tilt rScooter  [] Power  (type of control):   Manufacturer:  Model:  Serial #:   Size:  Color:  Age:   Purchased by whom:   Current condition of mobility base:    Current seating system:                                                                       Age of seating system:    Describe posture in present seating system: Pt has difficulty with prolonged standing/walking due to pain in L foot, bil Legs and difficulty with WB through UE due to L UE pain   Is the current mobility meeting medical necessity?:  [] Yes [x] No Describe: Pt has difficulty with prolonged standing/walking due to pain in L foot, bil Legs and difficulty with WB through UE due to L UE pain                                    Ability to complete Mobility-Related Activities of Daily Living (  MRADL's) with Current Mobility Device:   Move room to room  [] Independent  [x] Min [] Mod [] Max assist  [] Unable  Comments: Uses rolator to move from room to room but has increased falls in last 6 months due to legs giving away, uses shower chair   Meal prep  [] Independent  [] Min [] Mod [x] Max assist  [] Unable    Feeding  [x] Independent  [] Min [] Mod [] Max assist  [] Unable    Bathing  [] Independent  [x] Min [] Mod [] Max assist  [] Unable    Grooming  [x] Independent  [] Min [] Mod [] Max assist  [] Unable    UE dressing  [x] Independent  [] Min [] Mod [] Max assist  [] Unable    LE dressing  [x] Independent   [] Min [] Mod [] Max assist  [] Unable    Toileting  [x] Independent  [] Min [] Mod [] Max assist  [] Unable    Bowel Mgt: [x]  Continent []  Incontinent []  Accidents []  Diapers []  Colostomy []  Bowel Program:  Bladder Mgt: [x]  Continent []  Incontinent []  Accidents []  Diapers []  Urinal []  Intermittent Cath []  Indwelling Cath []  Supra-pubic Cath     Current Mobility Equipment Trialed/ Ruled Out:    Does not meet mobility needs due to:    Mark all boxes that indicate inability to use the specific equipment listed     Meets needs for safe  independent functional  ambulation  /  mobility    Risk of  Falling or History of Falls    Enviromental limitations      Cognition    Safety concerns with  physical ability    Decreased / limitations endurance  & strength     Decreased / limitations  motor skills  & coordination    Pain    Pace /  Speed    Cardiac and/or  respiratory condition    Contra - indicated by diagnosis   Cane/Crutches  []   [x]   []   []   []   [x]   [x]   [x]   [x]   []   []    Walker / Rollator  []  NA   []   [x]   []   []   []   [x]   [x]   [x]   [x]   []   []     Manual Wheelchair K0001-K0007:  []  NA  []   []   []   []   [x]   [x]   [x]   [x]   [x]   []   []    Manual W/C (K0005) with power assist  [x]  NA  []   []   []   []   []   []   []   []   []   []   []    Scooter  [x]  NA  []   []   []   []   []   []   []   []   []   []   []    Power Wheelchair: standard joystick  []  NA  [x]   []   []   []   []   []   []   []   []   []   []    Power Wheelchair: alternative controls  []  NA  []   []   []   []   []   []   []   []   []   []   []    Summary:  The least costly alternative for independent functional mobility was found to be:    []  Crutch/Cane  []  Walker []  Manual w/c  []  Manual w/c with power assist   []  Scooter   [x]  Power w/c std joystick   []  Power w/c alternative control        []  Requires dependent care mobility device   Cabin crew for [employer] are  adequate for safe mobility equipment operation  [x]   Yes []   No  Patient is willing and motivated to use recommended mobility equipment  [x]   Yes []   No       []  Patient is unable to safely operate mobility equipment independently and requires dependent care equipment Comments:           SENSATION and SKIN ISSUES:  Sensation [x]  Intact  []  Impaired []  Absent []  Hyposensate []  Hypersensate  []  Defensiveness  Location(s) of impairment:    Pressure Relief Method(s):  []  Lean side to side to offload (without risk of falling)  []   W/C push up (4+ times/hour for 15+ seconds) [x]  Stand up (without risk of falling)     []  Other: (Describe): Effective pressure relief method(s) above can be performed consistently throughout the day: [x] Yes  []  No If not, Why?:  Skin Integrity Risk:       [x]  Low risk           []  Moderate risk            []  High risk  If high risk, explain:   Skin Issues/Skin Integrity  Current skin Issues  []  Yes [x]  No [x]  Intact  []   Red area   []   Open area  []  Scar tissue  []  At risk from prolonged sitting  Where: History of Skin Issues  []  Yes [x]  No Where : When: Stage: Hx of skin flap surgeries  []  Yes [x]  No Where:  When:  Pain: [x]  Yes []  No   Pain Location(s): L foot, R foot, bil LE, L UE Intensity scale: (0-10) :9/10 How does pain interfere with mobility and/or MRADLs? - difficulty with prolonged standing, walking, use of Rolator        MAT EVALUATION:  Neuro-Muscular Status: (Tone, Reflexive, Responses, etc.)     [x]   Intact   []  Spasticity:  []  Hypotonicity  []  Fluctuating  []  Muscle Spasms  []  Poor Righting Reactions/Poor Equilibrium Reactions  []  Primal Reflex(s):    Comments:            COMMENTS:    POSTURE:     Comments:  Pelvis Anterior/Posterior:  [x]  Neutral   []  Posterior  []  Anterior  []  Fixed - No movement []  Tendency away from neutral []  Flexible []  Self-correction []  External correction Obliquity (viewed from front)  [x]  WFL []  R Obliquity []  L Obliquity  []  Fixed - No movement []  Tendency away from neutral []  Flexible []  Self-correction []  External correction Rotation  [x]  WFL []  R anterior []  L anterior  []  Fixed - No movement []  Tendency away from neutral []  Flexible []  Self-correction []  External correction Tonal Influence Pelvis:  [x]  Normal []  Flaccid []  Low tone []  Spasticity []  Dystonia []  Pelvis thrust []  Other:    Trunk Anterior/Posterior:  [x]  WFL []  Thoracic kyphosis []  Lumbar lordosis  []  Fixed - No movement []  Tendency away from neutral []  Flexible []  Self-correction []  External  correction  [x]  WFL []  Convex to left  []  Convex to right []  S-curve   []  C-curve []  Multiple curves []  Tendency away from neutral []  Flexible []  Self-correction []  External correction Rotation of shoulders and upper trunk:  [x]  Neutral []  Left-anterior []  Right- anterior []  Fixed- no movement []  Tendency away from neutral []  Flexible []  Self correction []  External correction Tonal influence Trunk:  [x]  Normal []  Flaccid []  Low tone []  Spasticity []  Dystonia []  Other:   Head &  Neck  [x]  Functional []  Flexed    []  Extended []  Rotated right  []  Rotated left []  Laterally flexed right []  Laterally flexed left []  Cervical hyperextension   [x]  Good head control []  Adequate head control []  Limited head control []  Absent head control Describe tone/movement of head and neck:      Lower Extremity Measurements: LE ROM:  Active ROM Right 10/13/2024 Left 10/13/2024  Hip flexion    Hip extension    Hip abduction    Hip adduction    Knee flexion    Knee extension    Ankle dorsiflexion    Ankle plantarflexion     (Blank rows = not tested)  LE MMT:  MMT Right 10/13/2024 Left 10/13/2024  Hip flexion    Hip extension    Hip abduction    Hip adduction    Knee flexion    Knee extension    Ankle dorsiflexion    Ankle plantarflexion     (Blank rows = not tested)  Hip positions:  [x]  Neutral   []  Abducted   []  Adducted  []  Subluxed   []  Dislocated   []  Fixed   []  Tendency away from neutral []  Flexible []  Self-correction []  External correction   Hip Windswept:[x]  Neutral  []  Right    []  Left  []  Subluxed   []  Dislocated   []  Fixed   []  Tendency away from neutral []  Flexible []  Self-correction []  External correction  LE Tone: [x]  Normal []  Low tone []  Spasticity []  Flaccid []  Dystonia []  Rocks/Extends at hip []  Thrust into knee extension []  Pushes legs downward into footrest  Foot positioning: ROM Concerns: Dorsiflexed: []  Right   []   Left Plantar flexed: []  Right    []  Left Inversion: []  Right    []  Left Eversion: []  Right    []  Left  LE Edema: []  1+ (Barely detectable impression when finger is pressed into skin) [x]  2+ (slight indentation. 15 seconds to rebound)- L foot and R foot []  3+ (deeper indentation. 30 seconds to rebound) []  4+ (>30 seconds to rebound)  UE Measurements:  UPPER EXTREMITY ROM:   Active ROM Right 10/13/2024 Left 10/13/2024  Shoulder flexion    Shoulder abduction    Shoulder adduction    Elbow flexion    Elbow extension    Wrist flexion    Wrist extension    (Blank rows = not tested)  UPPER EXTREMITY MMT:  MMT Right 10/13/2024 Left 10/13/2024  Shoulder flexion    Shoulder abduction    Shoulder adduction    Elbow flexion    Elbow extension    Wrist flexion    Wrist extension    Pinch strength    Grip strength    (Blank rows = not tested)  Shoulder Posture:  Right Tendency towards Left  [x]   Functional [x]    []   Elevation []    []   Depression []    []   Protraction []    []   Retraction []    []   Internal rotation []    []   External rotation []    []   Subluxed []     UE Tone: [x]  Normal []  Flaccid []  Low tone []  Spasticity  []  Dystonia []  Other:   UE Edema: [x]  1+ (Barely detectable impression when finger is pressed into skin) []  2+ (slight indentation. 15 seconds to rebound) []  3+ (deeper indentation. 30 seconds to rebound) []  4+ (>30 seconds to rebound)  Wrist/Hand: Handedness: [x]  Right   []  Left   []   NA: Comments:  Right  Left  [x]   WNL []    []   Limitations []    []   Contractures []    []   Fisting []    []   Tremors []    []   Weak grasp [x]    []   Poor dexterity []    []   Hand movement non functional []    []   Paralysis []         MOBILITY BASE RECOMMENDATIONS and JUSTIFICATION:  MOBILITY BASE  JUSTIFICATION   Manufacturer:   Scientist, research (physical sciences):       Evo                       Color:  Seat Width:  18 Seat Depth 18   []  Manual mobility base (continue  below)   []  Scooter/POV  [x]  Power mobility base   Number of hours per day spent in above selected mobility base: 6-8+  Typical daily mobility base use Schedule: during the day intermittently to manage faigue and pain   [x]  is not a safe, functional ambulator  [x]  limitation prevents from completing a MRADL(s) within a reasonable time frame    [x]  limitation places at high risk of morbidity or mortality secondary to  the attempts to perform a    MRADL(s)  []  limitation prevents accomplishing a MRADL(s) entirely  [x]  provide independent mobility  [x]  equipment is a lifetime medical need  [x]  walker or cane inadequate  [x]  any type manual wheelchair      inadequate  []  scooter/POV inadequate      []  requires dependent mobility          MANUAL MOBILITY      []  Standard manual wheelchair  K0001      Arm:    []  both []  right  []  left      Foot:   []  both []  right   []  left  []  self-propels wheelchair  []  will use on regular basis  []  chair fits throughout home  []  willing and motivated to use  []  propels with assistance     []  dependent use   []  Standard hemi-manual wheelchair  K0002      Arm:    []  both []  right  []  left      Foot:   []  both []  right   []  left  []  lower seat height required to foot propel  []  short stature  []  self-propels wheelchair  []  will use on regular basis  []  chair fits throughout home  []  willing and motivated to use   []  propels with assistance  []  dependent use   []  Lightweight manual wheelchair  K0003      Arm:    []  both []  right  []  left      Foot:   []  both  []  right  []  left                   []  hemi height required  []  medical condition and weight of  wheelchair affect ability to self      propel standard manual wheelchair in the residence  []  can and does self-propel (marginal propulsion skills)  []  daily use _________hours  []  chair fits throughout home  []  willing and motivated to use  []  lower seat height required to foot propel   []  short stature   []  High strength lightweight manual  wheelchair (Breezy Ultra 4)  K0004     Arm:    []   both []  right  []  left     Foot:   []  both []  right   []  left                                                                  []  hemi height required []  medical condition and weight of wheelchair affect ability to self propel while engaging in frequent MRADL(s) that cannot be performed in a standard or lightweight manual wheelchair  []  daily use _________hours  []  chair fits throughout home  []  willing and motivated to use  []  prevent repetitive use injuries   []  lower seat height required to foot propel  []  short stature    []  Ultra-lightweight manual wheelchair  K0005     Arm:    []  both []  right  []  left     Foot:   []  both []  right  []  left       []  hemi height required  []  heavy duty    Front seat to floor _____ inches      Rear seat to floor _____ inches      Back height _____ inches     Back angle ______ degrees      Front angle _____ degrees  []   full-time manual wheelchair user  []  Requires individualized fitting and optimal adjustments for multiple features that include adjustable axle configuration, fully adjustable center of gravity, wheel camber, seat and back angle, angle of seat slope, which cannot be accommodated by a K0001 through K0004 manual wheelchair  []  prevent repetitive use injuries  []  daily use_________hours   []  user has high activity patterns that frequently require  them  to go out into the community for the purpose of independently accomplishing high level MRADL activities. Examples of these might include a combination of; shopping, work, school, Photographer, childcare, independently loading and unloading from a vehicle etc.  []  lower seat height required to foot propel  []  short stature  []  heavy duty -  weight over 250lbs   []  Current chair is a K0005   manufacture:___________________  model:_________________  serial#____________________   age:_________    []  First time X9994 user (complete trial)  K0004 time and # of strokes to propel 30 feet: ________seconds _________strokes  X9994 time and # of strokes to propel 30 feet: ________seconds _________strokes  What was the result of the trial between the K0004 and K0005 manual wheelchair? ___    What features of the K0005 w/c are needed as compared to the K0004 base? Why?___    []  adjustable seat and back angle changes the angle of seat slope of the frame to attain a gravity assisted position for efficient propulsion and proper weight distribution along the frame     []  the front of the wheelchair will be configured higher than the back of the chair to allow gravity to assist the user with postural stability  []  the center of the wheel will be positioned for stability, safety and efficient propulsion  []  adjustable axle allows for vertical, horizontal, camber and overall width changes  throughout the wheels for adjustment of the client's exact needs and abilities.   []  adjustable axle increases the stability and function of the chair allowing for adjustment of the center of  gravity.   []  accommodates the client's anatomical position in the chair maximizing independence in mobility and maneuverability in all environments.   []  create a minimal fixed tilt-in space to assist in positioning.   []  Describe users full-time manual wheelchair activity patterns:___    []  Power assist Comments:  []  prevent repetitive use injuries  []  repetitive strain injury present in    shoulder girdle    []  shoulder pain is (> or =) to 7/10     during manual propulsion       Current Pain _____/10  []  requires conservation of energy to participate in MRADL(s) runable to propel up ramps or curbs using manual wheelchair  []  been K0005 user greater than one year  []  user unwilling to use power      wheelchair (reason): []  less expensive option to power   wheelchair   []  rim activated power assist -       decreased strength   []  Heavy duty manual wheelchair       K0006     Arm:    []  both []  right  []  left     Foot:   []  both []  right  []  left     []  hemi height required    []  Dependent base  []  user exceeds 250lbs  []  non-functional ambulator    []  extreme spasticity  []  over active movement   []  broken frame/hx of repeated     repairs  []  able to self-propel in residence       []  lower seat to floor height required  []  unable to self-propel in residence   []  Extra heavy duty manual wheelchair  K0007     Arm:    []  both []  right  []  left     Foot:   []  both []  right  []  left     []  hemi height required  []  Dependent base  []  user exceeds 300lbs  []  non-functional ambulator    []  able to self-propel in residence   []  lower seat to floor height required  []  unable to self-propel in residence     []  Manual wheelchair with tilt [phone]      (Manual "Tilt-n-Space")  []  patient is dependent for transfers  []  patient requires frequent       positioning for pressure relief   []  patient requires frequent      positioning for poor/absent trunk control        []  Stroller Base  []  infant/child   []  unable to propel manual      wheelchair  []  allows for growth  []  non-functional ambulator  []  non-functional UE  []  independent mobility is not a goal at this time    MANUAL FRAME OPTIONS      Push handles  []  extended   []  angle adjustable   []  standard  []  caregiver access  []  caregiver assist    []  allows "hooking" to enable      increased ability to perform ADLs or maintain balance   []  Angle Adjustable Back  []  postural control  []  control of tone/spasticity  []  accommodation of range of motion  []  UE functional control  []  accommodation for seating system    Rear wheel placement  []  std/fixed  [] fully adjustableramputee   []  camber ________degree  []  removable rear wheel  []  non-removable rear wheel  Wheel size _______  Wheel style_______________________  []  improved UE  access to wheels  []   increase propulsion ability  []  improved stability  []  changing angle in space for      improvement of postural stability  []  remove for transport    []  allow for seating system to fit on  base  []  amputee placement  []  1-arm drive access   r R  r L  []  enable propulsion of manual       wheelchair with one arm    []  amputee placement   Wheel rims/ Hand rims  []  Standard    []  Specialized-____ []  provide ability to propel manual   []  increase self-propulsion with hand wheelchair weakness/decreased grasp     []  Spoke protector/guard   []  prevent hands from getting caught in spokes   Tires:  []  pneumatic  []  flat free inserts  []  solid  Style:  []  decrease roll resistance              []  prevent frequent flats  []  increase shock absorbency  []  decrease maintenance   []  decrease pain from road shock    []  decrease spasms from road shock    Wheel Locks:    []  push []  pull []  scissor  []  lock wheels for transfers  []  lock wheels from rolling   Brake/wheel lock extension:  []  R  []  L  []  allow user to operate wheel locks due to decreased reach or strength   Caster housing:  Materials engineer size:                      Style:                                          []  suspension fork  []  maneuverability   []  stability of wheelchair   []  durability  []  maintenance  []  angle adjustment for posture  []  allow for feet to come under        wheelchair base  []  allows change in seat to floor  height   []  increase shock absorbency  []  decrease pain from road shock  []  decrease spasms from road    shock   []  Side guards  []  prevent clothing getting caught in wheel or becoming soiled   [] provide hip and pelvic stability  []  eliminates contact between body and wheels  []  limit hand contact with wheels   []  Anti-tippers      []  prevent wheelchair from tipping    backward  []  assist caregiver with curbs     POWER MOBILITY      []  Scooter/POV    []  can safely operate   []  can  safely transfer   []  has adequate trunk stability   []  cannot functionally propel  manual wheelchair    [x]  Power mobility base    []  non-ambulatory   [x]  cannot functionally propel manual wheelchair   [x]  cannot functionally and safely      operate scooter/POV  [x]  can safely operate power       wheelchair  [x]  home is accessible  [x]  willing to use power wheelchair     Tilt  []  Powered tilt on powered chair  []  Powered tilt on manual chair  []  Manual tilt on manual chair Comments:  []  change position for pressure      []  elief/cannot weight shift   []  change position against  gravitational force on head and      shoulders   []  decrease pain  []  blood pressure management   []  control autonomic dysreflexia  []  decrease respiratory distress  []  management of spasticity  []  management of low tone  []  facilitate postural control   []  rest periods   []  control edema  []  increase sitting tolerance   []  aid with transfers     Recline   []  Power recline on power chair  []  Manual recline on manual chair  Comments:    []  intermittent catheterization  []  manage spasticity  []  accommodate femur to back angle  []  change position for pressure relief/cannot weight shift rhigh risk of pressure sore development  []  tilt alone does not accomplish     effective pressure relief, maximum pressure relief achieved at -      _______ degrees tilt   _______ degrees recline   []  difficult to transfer to and from bed []  rest periods and sleeping in chair  []  repositioning for transfers  []  bring to full recline for ADL care  []  clothing/diaper changes in chair  []  gravity PEG tube feeding  []  head positioning  []  decrease pain  []  blood pressure management   []  control autonomic dysreflexia  []  decrease respiratory distress  []  user on ventilator     Elevator on mobility base  []  Power wheelchair  []  Scooter  []  increase Indep in transfers   []  increase Indep in ADLs    []  bathroom  function and safety  []  kitchen/cooking function and safety  []  shopping  []  raise height for communication at standing level  []  raise height for eye contact which reduces cervical neck strain and pain  []  drive at raised height for safety and navigating crowds  []  Other:   []  Vertical position system  (anterior tilt)     (Drive locks-out)    []  Stand       (Drive enabled)  []  independent weight bearing  []  decrease joint contractures  []  decrease/manage spasticity  []  decrease/manage spasms  []  pressure distribution away from   scapula, sacrum, coccyx, and ischial tuberosity  []  increase digestion and elimination   []  access to counters and cabinets  []  increase reach  []  increase interaction with others at eye level, reduces neck strain  []  increase performance of       MRADL(s)      Power elevating legrest    []  Center mount (Single) 85-170 degrees       []  Standard (Pair) 100-170 degrees  []  position legs at 90 degrees, not available with std power ELR  []  center mount tucks into chair to decrease turning radius in home, not available with std power ELR  []  provide change in position for LE  []  elevate legs during recline    []  maintain placement of feet on      footplate  []  decrease edema  []  improve circulation  []  actuator needed to elevate legrest  []  actuator needed to articulate legrest preventing knees from flexing  []  Increase ground clearance over      curbs  []   STD (pair) independently                     elevate legrest   POWER WHEELCHAIR CONTROLS      Controls/input device  []  Expandable  [x]  Non-expandable  [x]  Proportional  [x]  Right Hand []  Left Hand  []  Non-proportional/switches/head-array  []   Electrical/proximity         []   Mechanical      Manufacturer:___________________   Type:________________________ [x]  provides access for controlling wheelchair  [x]  programming for accurate control  []  progressive disease/changing condition  []  required  for alternative drive      controls       []  lacks motor control to operate  proportional drive control  []  unable to understand proportional controls  []  limited movement/strength  []  extraneous movement / tremors / ataxic / spastic       []  Upgraded electronics controller/harness    []  Single power (tilt or recline)   []  Expandable    []  Non-expandable plus   []  Multi-power (tilt, recline, power legrest, power seat lift, vertical positioning system, stand)  []  allows input device to communicate with drive motors  []  harness provides necessary connections between the controller, input device, and seat functions     []  needed in order to operate power seat functions through joystick/ input device  []  required for alternative drive controls     []  Enhanced display  []  required to connect all alternative drive controls   []  required for upgraded joystick      (lite-throw, heavy duty, micro)  []  Allows user to see in which mode and drive the wheelchair is set; necessary for alternate controls       []  Upgraded tracking electronics  []  correct tracking when on uneven surfaces makes switch driving more efficient and less fatiguing  []  increase safety when driving  []  increase ability to traverse thresholds    []  Safety / reset / mode switches     Type:    []  Used to change modes and stop the wheelchair when driving     [x]  Mount for joystick / input device/switches  [x]  swing away for access or transfers   []  attaches joystick / input device / switches to wheelchair   []  provides for consistent access  []  midline for optimal placement    []  Attendant controlled joystick plus     mount  []  safety  []  long distance driving  []  operation of seat functions  []  compliance with transportation regulations    [x]  Battery U1 x 2 [x]  required to power (power assist / scooter/ power wc / other):   []  Power inverter (24V to 12V)  []  required for ventilator / respiratory equipment / other:      CHAIR OPTIONS MANUAL & POWER      Armrests   [x]  adjustable height []  removable  []  swing away []  fixed  [x]  flip back  []  reclining  [x]  full length pads []  desk []  tube arms []  gel pads  [x]  provide support with elbow at 90    []  remove/flip back/swing away for  transfers  []  provide support and positioning of upper body    []  allow to come closer to table top  []  remove for access to tables  []  provide support for w/c tray  []  change of height/angles for variable activities   []  Elbow support / Elbow stop  []  keep elbow positioned on arm pad  []  keep arms from falling off arm pad  during tilt and/or recline   Upper Extremity Support  []  Arm trough  []   R  []   L  Style:  []  swivel mount []  fixed mount   []  posterior hand support  []   tray  []  full tray  []  joystick cut out  []   R  []   L  Style:  []  decrease gravitational pull on      shoulders  []  provide support to increase UE  function  []  provide hand support in natural    position  []  position flaccid UE  []  decrease subluxation    []  decrease edema       []  manage spasticity   []  provide midline positioning  []  provide work surface  []  placement for AAC/ Computer/ EADL       Hangers/ Legrests   []  ______ degree  []  Elevating []  articulating  []  swing away []  fixed []  lift off  []  heavy duty  []  adjustable knee angle  []  adjustable calf panel   []  longer extension tube              []  provide LE support  []  maintain placement of feet on      footplate   []  accommodate lower leg length  []  accommodate to hamstring       tightness  []  enable transfers  []  provide change in position for LE's  []  elevate legs during recline    []  decrease edema  []  durability      Foot support   [x]  footplate []  R []  L [x]  flip up           []  Depth adjustable   []  angle adjustable  []  foot board/one piece    [x]  provide foot support  []  accommodate to ankle ROM  []  allow foot to go under wheelchair base  []  enable  transfers     []  Shoe holders  []  position foot    []  decrease / manage spasticity  []  control position of LE  []  stability    []  safety     []  Ankle strap/heel      loops  []  support foot on foot support  []  decrease extraneous movement  []  provide input to heel   []  protect foot     []  Amputee adapter []  R  []  L     Style:                  Size:  []  Provide support for stump/residual extremity    []  Transportation tie-down  []  to provide crash tested tie-down brackets    []  Crutch/cane holder    []  O2 holder    []  IV hanger   []  Ventilator tray/mount    []  stabilize accessory on wheelchair       Component  Justification     []  Seat cushion      []  accommodate impaired sensation  []  decubitus ulcers present or history  []  unable to shift weight  []  increase pressure distribution  []  prevent pelvic extension  []  custom required "off-the-shelf"    seat cushion will not accommodate deformity  []  stabilize/promote pelvis alignment  []  stabilize/promote femur alignment  []  accommodate obliquity  []  accommodate multiple deformity  []  incontinent/accidents  []  low maintenance     []  seat mounts                 []  fixed []  removable  []  attach seat platform/cushion to wheelchair frame    []  Seat wedge    []  provide increased aggressiveness of seat shape to decrease sliding  down in the seat  []  accommodate ROM        []  Cover replacement   []  protect back or seat cushion  []  incontinent/accidents    []   Solid seat / insert    []  support cushion to prevent      hammocking  []  allows attachment of cushion to mobility base    []  Lateral pelvic/thigh/hip     support (Guides)     []  decrease abduction  []  accommodate pelvis  []  position upper legs  []  accommodate spasticity  []  removable for transfers     []  Lateral pelvic/thigh      supports mounts  []  fixed   []  swing-away   []  removable  []  mounts lateral pelvic/thigh supports     []  mounts lateral pelvic/thigh supports  swing-away or removable for transfers    []  Medial thigh support (Pommel)  [] decrease adduction  [] accommodate ROM  []  remove for transfers   []  alignment      []  Medial thigh   []  fixed      support mounts      []  swing-away   []  removable  []  mounts medial thigh supports   []  Mounts medial supports swing- away or removable for transfers       Component  Justification   []  Back       []  provide posterior trunk support []  facilitate tone  []  provide lumbar/sacral support []  accommodate deformity  []  support trunk in midline   []  custom required "off-the-shelf" back support will not accommodate deformity   []  provide lateral trunk support []  accommodate or decrease tone            []  Back mounts  []  fixed  []  removable  []  attach back rest/cushion to wheelchair frame   []  Lateral trunk      supports  []  R []  L  []  decrease lateral trunk leaning  []  accommodate asymmetry    []  contour for increased contact  []  safety    []  control of tone    []  Lateral trunk      supports mounts  []  fixed  []  swing-away   []  removable  []  mounts lateral trunk supports     []  Mounts lateral trunk supports swing-away or removable for transfers   []  Anterior chest      strap, vest     []  decrease forward movement of shoulder  []  decrease forward movement of trunk  []  safety/stability  []  added abdominal support  []  trunk alignment  []  assistance with shoulder control   []  decrease shoulder elevation    []  Headrest      []  provide posterior head support  []  provide posterior neck support  []  provide lateral head support  []  provide anterior head support  []  support during tilt and recline  []  improve feeding     []  improve respiration  []  placement of switches  []  safety    []  accommodate ROM   []  accommodate tone  []  improve visual orientation   []  Headrest           []  fixed []  removable []  flip down      Mounting hardware   []  swing-away laterals/switches  []  mount headrest   []  mounts  headrest flip down or  removable for transfers  []  mount headrest swing-away laterals   []  mount switches     []  Neck Support    []  decrease neck rotation  []  decrease forward neck flexion   Pelvic Positioner    [x]  std hip belt          []  padded hip belt  []  dual pull hip belt  []   four point hip belt  []  stabilize tone  [x]  decrease falling out of chair  []  prevent excessive extension  []  special pull angle to control      rotation  []  pad for protection over boney   prominence  []  promote comfort    []  Essential needs        bag/pouch   []  medicines []  special food rorthotics []  clothing changes  []  diapers  []  catheter/hygiene []  ostomy supplies   The above equipment has a life- long use expectancy.  Growth and changes in medical and/or functional conditions would be the exceptions.   SUMMARY:    ASSESSMENT: Grip strength: R = 60lbs; L =25 lbs Timed up and Go test: 36 sec with Rolator (>12 sec indicates a fall risk) Gait speed: 0.50 m/s with Rolator (<0.50 m/s indicates a fall risk) CLINICAL IMPRESSION:  The patient is an 83 year old male who was evaluated today for physical therapy and power mobility assessment. He currently uses a rollator for in-home mobility; however, this device is no longer meeting his medical and functional needs. Over the past six months, the patient has experienced 6-7 falls, indicating a significant safety concern and increased risk for injury. The patient is unable to safely propel a manual wheelchair due to weakness and pain in the left upper extremity, which limits his ability to perform functional mobility independently. Additionally, the patient demonstrated prolonged completion time on the Timed Up and Go (TUG) test, further confirming his high fall risk and reduced mobility capacity. Given the patient's current condition, a Group 2 power wheelchair is the least costly and most appropriate device to meet his medical and functional needs. This device  will:  - Reduce fall risk by providing stable, powered mobility - Improve safety during in-home navigation and community access - Support pain management by minimizing exertion and strain - Enable functional independence for attending medical appointments and performing daily activities without the risk of legs giving out  The Group 2 power wheelchair will allow the patient to safely navigate his home environment and maintain access to essential healthcare services, significantly improving his quality of life and reducing the likelihood of further injury.   OBJECTIVE IMPAIRMENTS Abnormal gait, decreased activity tolerance, decreased balance, decreased endurance, decreased mobility, difficulty walking, decreased strength, decreased safety awareness, increased edema, increased fascial restrictions, impaired flexibility, impaired sensation, impaired UE functional use, postural dysfunction, and pain.   ACTIVITY LIMITATIONS carrying, lifting, bending, standing, stairs, transfers, bathing, toileting, locomotion level, and caring for others  PARTICIPATION LIMITATIONS: meal prep, cleaning, laundry, driving, shopping, and community activity  PERSONAL FACTORS Age, Time since onset of injury/illness/exacerbation, and 3+ comorbidities: L foot pain, R foot pain, chronic L UE pain are also affecting patient's functional outcome.   REHAB POTENTIAL: Good  CLINICAL DECISION MAKING: Stable/uncomplicated  EVALUATION COMPLEXITY: Moderate                                   GOALS: One time visit. No goals established.    PLAN: PT FREQUENCY: one time visit    Raj LOISE Blanch, PT 10/13/2024, 11:32 AM    I concur with the above findings and recommendations of the therapist:  Physician name printed:         Physician's signature:      Date:

## 2024-10-13 ENCOUNTER — Ambulatory Visit: Attending: Physician Assistant

## 2024-10-13 ENCOUNTER — Other Ambulatory Visit: Payer: Self-pay

## 2024-10-13 DIAGNOSIS — M6281 Muscle weakness (generalized): Secondary | ICD-10-CM | POA: Diagnosis present

## 2024-10-13 DIAGNOSIS — R2689 Other abnormalities of gait and mobility: Secondary | ICD-10-CM | POA: Diagnosis present

## 2024-10-13 NOTE — Addendum Note (Signed)
 Addended by: TOBIE RAJ SAILOR on: 10/13/2024 11:34 AM   Modules accepted: Orders

## 2024-10-24 ENCOUNTER — Ambulatory Visit: Admitting: Podiatry

## 2024-10-31 ENCOUNTER — Other Ambulatory Visit: Payer: Self-pay | Admitting: Physician Assistant

## 2024-10-31 DIAGNOSIS — R42 Dizziness and giddiness: Secondary | ICD-10-CM

## 2024-11-02 ENCOUNTER — Telehealth: Payer: Self-pay | Admitting: Podiatry

## 2024-11-02 ENCOUNTER — Other Ambulatory Visit: Payer: Self-pay | Admitting: Podiatry

## 2024-11-02 MED ORDER — OXYCODONE-ACETAMINOPHEN 10-325 MG PO TABS: 1.0000 | ORAL_TABLET | Freq: Three times a day (TID) | ORAL | 0 refills | Status: AC | PRN

## 2024-11-02 NOTE — Telephone Encounter (Signed)
 Patient is calling requesting refill for pain medication. Patient stated he would like to get medication as soon as possible

## 2024-11-09 ENCOUNTER — Ambulatory Visit: Admitting: Podiatry

## 2024-11-09 ENCOUNTER — Encounter: Payer: Self-pay | Admitting: Podiatry

## 2024-11-09 DIAGNOSIS — M79676 Pain in unspecified toe(s): Secondary | ICD-10-CM

## 2024-11-09 DIAGNOSIS — G5782 Other specified mononeuropathies of left lower limb: Secondary | ICD-10-CM

## 2024-11-09 DIAGNOSIS — B351 Tinea unguium: Secondary | ICD-10-CM

## 2024-11-09 MED ORDER — OXYCODONE-ACETAMINOPHEN 10-325 MG PO TABS: 1.0000 | ORAL_TABLET | Freq: Three times a day (TID) | ORAL | 0 refills | Status: AC | PRN

## 2024-11-09 NOTE — Progress Notes (Signed)
.    He presents today complaining of painful left foot.  States it is still tender right and here she points to the fourth metatarsal phalangeal joint area of the left foot.  Objective: Vital signs stable alert oriented x 3.  He has pain on end range of motion of the fourth toe at the metatarsal phalangeal joint.  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  Assessment: Capsulitis forefoot left.  Severe peripheral vascular disease.  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  Tolerated procedure well refilled his Percocet for him.  Debrided his toenails 1 through 5 bilateral.

## 2024-11-13 ENCOUNTER — Inpatient Hospital Stay: Admission: RE | Admit: 2024-11-13 | Source: Ambulatory Visit

## 2024-12-05 ENCOUNTER — Ambulatory Visit: Admitting: Podiatry

## 2024-12-05 ENCOUNTER — Encounter: Payer: Self-pay | Admitting: Podiatry

## 2024-12-05 ENCOUNTER — Other Ambulatory Visit: Payer: Self-pay | Admitting: Podiatry

## 2024-12-05 DIAGNOSIS — G5782 Other specified mononeuropathies of left lower limb: Secondary | ICD-10-CM

## 2024-12-05 MED ORDER — OXYCODONE-ACETAMINOPHEN 10-325 MG PO TABS: 1.0000 | ORAL_TABLET | Freq: Three times a day (TID) | ORAL | 0 refills | Status: AC | PRN

## 2024-12-05 NOTE — Progress Notes (Signed)
.    He presents today complaining of painful left foot.  States it is still tender right and here she points to the fourth metatarsal phalangeal joint area of the left foot.  Objective: Vital signs stable alert oriented x 3.  He has pain on end range of motion of the fourth toe at the metatarsal phalangeal joint.  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  Assessment: Capsulitis forefoot left.  Severe peripheral vascular disease.  Painful mycotic nails 1 through 5 bilateral  Plan: I injected the fourth metatarsal phalangeal joint left foot today with dexamethasone  Kenalog  local anesthetic.  Tolerated procedure well refilled his Percocet for him.  Debrided his toenails 1 through 5 bilateral.

## 2024-12-27 ENCOUNTER — Other Ambulatory Visit: Payer: Self-pay | Admitting: Podiatry

## 2024-12-27 MED ORDER — OXYCODONE-ACETAMINOPHEN 10-325 MG PO TABS: 1.0000 | ORAL_TABLET | Freq: Three times a day (TID) | ORAL | 0 refills | Status: AC | PRN

## 2024-12-27 NOTE — Progress Notes (Unsigned)
"  per  "

## 2024-12-28 ENCOUNTER — Ambulatory Visit: Admitting: Podiatry

## 2025-01-01 ENCOUNTER — Telehealth: Payer: Self-pay | Admitting: Podiatry
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
## 2025-01-04 ENCOUNTER — Ambulatory Visit: Admitting: Podiatry

## 2025-01-04 ENCOUNTER — Encounter: Payer: Self-pay | Admitting: Podiatry

## 2025-01-04 DIAGNOSIS — G5782 Other specified mononeuropathies of left lower limb: Secondary | ICD-10-CM

## 2025-01-04 MED ORDER — OXYCODONE-ACETAMINOPHEN 10-325 MG PO TABS: 1.0000 | ORAL_TABLET | ORAL | 0 refills | Status: AC | PRN
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
## 2025-01-09 ENCOUNTER — Other Ambulatory Visit: Payer: Self-pay | Admitting: Podiatry

## 2025-01-09 ENCOUNTER — Ambulatory Visit: Admitting: Podiatry

## 2025-01-09 MED ORDER — OXYCODONE-ACETAMINOPHEN 10-325 MG PO TABS: 1.0000 | ORAL_TABLET | Freq: Three times a day (TID) | ORAL | 0 refills | Status: AC | PRN

## 2025-02-08 ENCOUNTER — Ambulatory Visit: Admitting: Podiatry
